# Patient Record
Sex: Female | Born: 1935
Health system: Southern US, Community
[De-identification: ages and names within clinical notes are randomized; demographics above are authoritative.]

## PROBLEM LIST (undated history)

## (undated) DIAGNOSIS — I471 Supraventricular tachycardia, unspecified: Secondary | ICD-10-CM

## (undated) DIAGNOSIS — H5440 Blindness, one eye, unspecified eye: Secondary | ICD-10-CM

## (undated) DIAGNOSIS — K5732 Diverticulitis of large intestine without perforation or abscess without bleeding: Secondary | ICD-10-CM

## (undated) DIAGNOSIS — A048 Other specified bacterial intestinal infections: Secondary | ICD-10-CM

## (undated) DIAGNOSIS — I4891 Unspecified atrial fibrillation: Secondary | ICD-10-CM

## (undated) DIAGNOSIS — T4145XA Adverse effect of unspecified anesthetic, initial encounter: Secondary | ICD-10-CM

## (undated) DIAGNOSIS — J301 Allergic rhinitis due to pollen: Secondary | ICD-10-CM

## (undated) DIAGNOSIS — I639 Cerebral infarction, unspecified: Secondary | ICD-10-CM

## (undated) DIAGNOSIS — I2510 Atherosclerotic heart disease of native coronary artery without angina pectoris: Secondary | ICD-10-CM

## (undated) DIAGNOSIS — I8390 Asymptomatic varicose veins of unspecified lower extremity: Secondary | ICD-10-CM

## (undated) DIAGNOSIS — K5790 Diverticulosis of intestine, part unspecified, without perforation or abscess without bleeding: Secondary | ICD-10-CM

## (undated) DIAGNOSIS — I10 Essential (primary) hypertension: Secondary | ICD-10-CM

## (undated) DIAGNOSIS — I219 Acute myocardial infarction, unspecified: Secondary | ICD-10-CM

## (undated) DIAGNOSIS — C4491 Basal cell carcinoma of skin, unspecified: Secondary | ICD-10-CM

## (undated) DIAGNOSIS — IMO0002 Reserved for concepts with insufficient information to code with codable children: Secondary | ICD-10-CM

## (undated) DIAGNOSIS — T8859XA Other complications of anesthesia, initial encounter: Secondary | ICD-10-CM

## (undated) DIAGNOSIS — K219 Gastro-esophageal reflux disease without esophagitis: Secondary | ICD-10-CM

## (undated) DIAGNOSIS — Z8489 Family history of other specified conditions: Secondary | ICD-10-CM

## (undated) DIAGNOSIS — K2970 Gastritis, unspecified, without bleeding: Secondary | ICD-10-CM

## (undated) DIAGNOSIS — M1990 Unspecified osteoarthritis, unspecified site: Secondary | ICD-10-CM

## (undated) HISTORY — DX: Supraventricular tachycardia: I47.1

## (undated) HISTORY — PX: CLOSED REDUCTION SHOULDER DISLOCATION: SUR242

## (undated) HISTORY — PX: CORNEAL TRANSPLANT: SHX108

## (undated) HISTORY — PX: TONSILLECTOMY: SUR1361

## (undated) HISTORY — PX: EYE SURGERY: SHX253

## (undated) HISTORY — DX: Diverticulitis of large intestine without perforation or abscess without bleeding: K57.32

## (undated) HISTORY — PX: DILATION AND CURETTAGE OF UTERUS: SHX78

## (undated) HISTORY — DX: Diverticulosis of intestine, part unspecified, without perforation or abscess without bleeding: K57.90

## (undated) HISTORY — PX: JOINT REPLACEMENT: SHX530

## (undated) HISTORY — DX: Supraventricular tachycardia, unspecified: I47.10

## (undated) HISTORY — PX: BASAL CELL CARCINOMA EXCISION: SHX1214

## (undated) HISTORY — PX: SQUAMOUS CELL CARCINOMA EXCISION: SHX2433

## (undated) HISTORY — DX: Other specified bacterial intestinal infections: A04.8

## (undated) HISTORY — DX: Gastritis, unspecified, without bleeding: K29.70

---

## 1964-03-23 HISTORY — PX: TUBAL LIGATION: SHX77

## 1983-11-22 HISTORY — PX: TOTAL ABDOMINAL HYSTERECTOMY: SHX209

## 1991-03-24 HISTORY — PX: LAPAROSCOPIC CHOLECYSTECTOMY: SUR755

## 1998-11-04 ENCOUNTER — Other Ambulatory Visit: Admission: RE | Admit: 1998-11-04 | Discharge: 1998-11-04 | Payer: Self-pay | Admitting: Family Medicine

## 1999-08-11 ENCOUNTER — Emergency Department (HOSPITAL_COMMUNITY): Admission: EM | Admit: 1999-08-11 | Discharge: 1999-08-11 | Payer: Self-pay | Admitting: Emergency Medicine

## 2002-05-22 DIAGNOSIS — A048 Other specified bacterial intestinal infections: Secondary | ICD-10-CM

## 2002-05-22 HISTORY — DX: Other specified bacterial intestinal infections: A04.8

## 2002-09-16 ENCOUNTER — Encounter: Payer: Self-pay | Admitting: Emergency Medicine

## 2002-09-16 ENCOUNTER — Emergency Department (HOSPITAL_COMMUNITY): Admission: EM | Admit: 2002-09-16 | Discharge: 2002-09-16 | Payer: Self-pay | Admitting: Emergency Medicine

## 2003-03-24 HISTORY — PX: CATARACT EXTRACTION W/ INTRAOCULAR LENS  IMPLANT, BILATERAL: SHX1307

## 2003-07-04 ENCOUNTER — Encounter: Admission: RE | Admit: 2003-07-04 | Discharge: 2003-07-04 | Payer: Self-pay | Admitting: Orthopedic Surgery

## 2003-10-11 ENCOUNTER — Ambulatory Visit (HOSPITAL_COMMUNITY): Admission: RE | Admit: 2003-10-11 | Discharge: 2003-10-11 | Payer: Self-pay | Admitting: Family Medicine

## 2004-06-10 ENCOUNTER — Ambulatory Visit: Payer: Self-pay | Admitting: Cardiology

## 2004-06-17 ENCOUNTER — Ambulatory Visit: Payer: Self-pay

## 2004-08-05 ENCOUNTER — Ambulatory Visit: Payer: Self-pay | Admitting: Cardiology

## 2004-12-20 ENCOUNTER — Emergency Department (HOSPITAL_COMMUNITY): Admission: EM | Admit: 2004-12-20 | Discharge: 2004-12-21 | Payer: Self-pay | Admitting: Emergency Medicine

## 2005-01-09 ENCOUNTER — Ambulatory Visit: Payer: Self-pay | Admitting: Cardiology

## 2005-08-27 ENCOUNTER — Encounter: Admission: RE | Admit: 2005-08-27 | Discharge: 2005-08-27 | Payer: Self-pay | Admitting: Orthopedic Surgery

## 2006-03-17 ENCOUNTER — Encounter: Admission: RE | Admit: 2006-03-17 | Discharge: 2006-03-17 | Payer: Self-pay | Admitting: Family Medicine

## 2007-10-12 ENCOUNTER — Encounter: Admission: RE | Admit: 2007-10-12 | Discharge: 2007-10-12 | Payer: Self-pay | Admitting: Family Medicine

## 2009-10-04 ENCOUNTER — Encounter: Admission: RE | Admit: 2009-10-04 | Discharge: 2009-10-04 | Payer: Self-pay | Admitting: Family Medicine

## 2009-10-30 ENCOUNTER — Encounter: Admission: RE | Admit: 2009-10-30 | Discharge: 2009-10-30 | Payer: Self-pay | Admitting: Family Medicine

## 2010-02-17 ENCOUNTER — Emergency Department (HOSPITAL_BASED_OUTPATIENT_CLINIC_OR_DEPARTMENT_OTHER): Admission: EM | Admit: 2010-02-17 | Discharge: 2010-02-17 | Payer: Self-pay | Admitting: Emergency Medicine

## 2010-02-17 ENCOUNTER — Ambulatory Visit: Payer: Self-pay | Admitting: Diagnostic Radiology

## 2010-06-04 LAB — DIFFERENTIAL
Basophils Absolute: 0.1 10*3/uL (ref 0.0–0.1)
Basophils Relative: 1 % (ref 0–1)
Eosinophils Relative: 2 % (ref 0–5)
Monocytes Absolute: 0.5 10*3/uL (ref 0.1–1.0)

## 2010-06-04 LAB — CBC
HCT: 40.1 % (ref 36.0–46.0)
MCHC: 35.2 g/dL (ref 30.0–36.0)
MCV: 90.9 fL (ref 78.0–100.0)
RDW: 13.2 % (ref 11.5–15.5)

## 2011-01-30 ENCOUNTER — Other Ambulatory Visit: Payer: Self-pay

## 2011-01-30 ENCOUNTER — Emergency Department (INDEPENDENT_AMBULATORY_CARE_PROVIDER_SITE_OTHER): Payer: Medicare Other

## 2011-01-30 ENCOUNTER — Encounter: Payer: Self-pay | Admitting: Emergency Medicine

## 2011-01-30 ENCOUNTER — Emergency Department (HOSPITAL_BASED_OUTPATIENT_CLINIC_OR_DEPARTMENT_OTHER)
Admission: EM | Admit: 2011-01-30 | Discharge: 2011-01-30 | Disposition: A | Discharge status: Home / Self Care | Payer: Medicare Other | Attending: Emergency Medicine | Admitting: Emergency Medicine

## 2011-01-30 DIAGNOSIS — R079 Chest pain, unspecified: Secondary | ICD-10-CM | POA: Insufficient documentation

## 2011-01-30 DIAGNOSIS — R000 Tachycardia, unspecified: Secondary | ICD-10-CM | POA: Insufficient documentation

## 2011-01-30 DIAGNOSIS — M549 Dorsalgia, unspecified: Secondary | ICD-10-CM | POA: Insufficient documentation

## 2011-01-30 DIAGNOSIS — R350 Frequency of micturition: Secondary | ICD-10-CM | POA: Insufficient documentation

## 2011-01-30 DIAGNOSIS — R209 Unspecified disturbances of skin sensation: Secondary | ICD-10-CM | POA: Insufficient documentation

## 2011-01-30 DIAGNOSIS — I700 Atherosclerosis of aorta: Secondary | ICD-10-CM

## 2011-01-30 DIAGNOSIS — H02409 Unspecified ptosis of unspecified eyelid: Secondary | ICD-10-CM | POA: Insufficient documentation

## 2011-01-30 DIAGNOSIS — I77810 Thoracic aortic ectasia: Secondary | ICD-10-CM | POA: Insufficient documentation

## 2011-01-30 LAB — BASIC METABOLIC PANEL
Chloride: 101 mEq/L (ref 96–112)
Creatinine, Ser: 0.7 mg/dL (ref 0.50–1.10)
GFR calc Af Amer: >90 mL/min (ref >90)
GFR calc non Af Amer: 78 mL/min — ABNORMAL LOW (ref >90)
Potassium: 3.7 mEq/L (ref 3.5–5.1)

## 2011-01-30 LAB — URINE MICROSCOPIC-ADD ON

## 2011-01-30 LAB — DIFFERENTIAL
Basophils Absolute: 0 10*3/uL (ref 0.0–0.1)
Basophils Relative: 0 % (ref 0–1)
Monocytes Absolute: 0.6 10*3/uL (ref 0.1–1.0)
Neutro Abs: 2.9 10*3/uL (ref 1.7–7.7)
Neutrophils Relative %: 48 % (ref 43–77)

## 2011-01-30 LAB — TROPONIN I
Troponin I: <0.3 ng/mL (ref <0.30)
Troponin I: <0.3 ng/mL (ref <0.30)

## 2011-01-30 LAB — URINALYSIS, ROUTINE W REFLEX MICROSCOPIC
Nitrite: NEGATIVE
Specific Gravity, Urine: 1.026 (ref 1.005–1.030)
Urobilinogen, UA: 0.2 mg/dL (ref 0.0–1.0)
pH: 7.5 (ref 5.0–8.0)

## 2011-01-30 LAB — PRO B NATRIURETIC PEPTIDE: Pro B Natriuretic peptide (BNP): 157.6 pg/mL (ref 0–450)

## 2011-01-30 LAB — CBC
MCHC: 33 g/dL (ref 30.0–36.0)
RDW: 12.6 % (ref 11.5–15.5)

## 2011-01-30 MED ORDER — IOHEXOL 350 MG/ML SOLN
100.0000 mL | Freq: Once | INTRAVENOUS | Status: AC | PRN
  Administered 2011-01-30: 100 mL via INTRAVENOUS

## 2011-01-30 MED ORDER — NITROGLYCERIN 0.4 MG SL SUBL
0.4000 mg | SUBLINGUAL_TABLET | SUBLINGUAL | Status: DC | PRN
  Filled 2011-01-30: qty 25

## 2011-01-30 MED ORDER — ASPIRIN 81 MG PO CHEW
81.0000 mg | CHEWABLE_TABLET | Freq: Once | ORAL | Status: AC
  Administered 2011-01-30: 81 mg via ORAL
  Filled 2011-01-30: qty 1

## 2011-01-30 NOTE — ED Notes (Signed)
Patient is resting comfortably. 

## 2011-01-30 NOTE — ED Notes (Signed)
Pt reports episode of chest pain radiating to Neck with rapid heart rate at 2330 arrived in no distress pain resolved

## 2011-01-30 NOTE — ED Provider Notes (Signed)
History     CSN: [id number] Arrival date & time: 01/30/2011  1:48 AM   First MD Initiated Contact with Patient 01/30/11 0150      Chief Complaint  Patient presents with  . Chest Pain    Pt reports single episode    (Consider location/radiation/quality/duration/timing/severity/associated sxs/prior treatment) Patient is a 77 y.o. female presenting with chest pain.  Chest Pain  Associated symptoms include numbness.    The patient is an 77 year old female who presents today with her daughter after having chest pain. Patient noted this at around 11 PM. She was sitting at home watching the news. Patient noted that she had left-sided chest pain that radiated down her left arm with some radiation to her right arm. She also described radiation through to her back. She describes some tingling sensations in her fingers. She said this pain was at least an 8/10. Patient denied any shortness of breath, diaphoresis, nausea, or vomiting with this. She reported that she did not "feel right". Patient went downstairs and took 381 mg aspirins. When her symptoms did not resolve after 45 minutes she called her daughter. Patient does not have any history of coronary artery disease. She has had a stress test performed by each we'll cardiology in the past that was negative. She has not had any recent cough or fever. She has urinated more than usual this evening but denies any dysuria or hematuria. Patient has virtually no medical problems. She is normally seen by Dr. Kevan Ny [facility]. The patient currently complains of some mild chest discomfort. History reviewed. No pertinent past medical history.  Past Surgical History  Procedure Date  . Cholecystectomy   . Tonsillectomy   . Abdominal hysterectomy     History reviewed. No pertinent family history.  History  Substance Use Topics  . Smoking status: Never Smoker   . Smokeless tobacco: Not on file  . Alcohol Use: No    OB History    Grav Para Term Preterm Abortions TAB SAB Ect Mult Living                  Review of Systems  Constitutional: Negative.   HENT: Negative.   Eyes: Negative.   Respiratory: Negative.   Cardiovascular: Positive for chest pain.  Gastrointestinal: Negative.   Genitourinary: Positive for frequency.  Musculoskeletal: Negative.   Skin: Negative.   Neurological: Positive for numbness.       In fingertips of both hands as noted previously in history of present illness  Hematological: Negative.   Psychiatric/Behavioral: Negative.   All other systems reviewed and are negative.    Allergies  Buprenex; Levaquin; Sulfa antibiotics; and Zetia  Home Medications   Current Outpatient Rx  Name Route Sig Dispense Refill  . ASPIRIN 81 MG PO TABS Oral Take 81 mg by mouth daily.        BP 162/100  Pulse 110  Temp 98 F (36.7 C)  Resp 22  SpO2 98%  Physical Exam  Nursing note and vitals reviewed. Constitutional: She is oriented to person, place, and time. She appears well-developed and well-nourished. No distress.  HENT:  Head: Normocephalic and atraumatic.  Eyes: Conjunctivae and EOM are normal. Pupils are equal, round, and reactive to light.  Neck: Normal range of motion.  Cardiovascular: Regular rhythm and normal heart sounds.  Tachycardia present.  Exam reveals no gallop and no friction rub.   No murmur heard. Pulmonary/Chest: Effort normal and breath sounds normal. No respiratory distress. She has  no wheezes. She has no rales.  Abdominal: Soft. Bowel sounds are normal. She exhibits no distension. There is no tenderness. There is no rebound and no guarding.  Musculoskeletal: Normal range of motion. She exhibits no edema and no tenderness.  Neurological: She is alert and oriented to person, place, and time. A cranial nerve deficit is present. She exhibits normal muscle tone. Coordination normal.       Patient has no new cranial nerve deficits but normally has left eye ptosis and no  vision out of this eye at baseline per her daughter who is at bedside  Skin: Skin is warm and dry. No rash noted.  Psychiatric: She has a normal mood and affect.    ED Course  Procedures (including critical care time)   Labs Reviewed  CBC  DIFFERENTIAL  PRO B NATRIURETIC PEPTIDE  TROPONIN I  BASIC METABOLIC PANEL  URINALYSIS, ROUTINE W REFLEX MICROSCOPIC   Dg Chest 2 View  01/30/2011  *RADIOLOGY REPORT*  Clinical Data: Chest pain  CHEST - 2 VIEW  Comparison: 10/04/2009 spine, 12/20/2004 chest radiograph  Findings: Mild tortuosity to the thoracic aorta.  Heart size within upper normal limits.  Mild right hemidiaphragm elevation. No focal areas consolidation.  No pleural effusion or pneumothorax. Multilevel degenerative changes of the thoracic spine.  Surgical clips right upper quadrant.  IMPRESSION: No acute cardiopulmonary process identified.  Original Report Authenticated By: Waneta Martins, M.D.    Date: 01/30/2011  Rate: 105  Rhythm: sinus tachycardia  QRS Axis: normal  Intervals: normal  ST/T Wave abnormalities: normal  Conduction Disutrbances:none  Narrative Interpretation: Patient borderline tachycardic to 99 on last previous EKG  Old EKG Reviewed: unchanged    No diagnosis found.    MDM  The patient was evaluated by myself. Patient had left-sided chest pain concerning for possible ACS. She was given an additional 81 mg tab of aspirin if she taken only 3 of these at home. Patient was ordered for nitroglycerin but stated that her chest discomfort had resolved prior to receiving this. Patient did have tachycardia with no other significant EKG changes. Her exam was unremarkable aside from this. Patient had a negative CBC, troponin, and chest x-ray. Her basic metabolic panel was also within normal limits. Given patient's report as well as her pain radiating to her back as well as the finding of sinus tachycardia CT of the chest was ordered. Given patient's pain in her chest  radiating to her back as well a CT abdomen and pelvis was added as well to image the entire aorta barring any abnormalities on chest. Patient remained hemodynamically stable.  5:16 AM Patient's tachycardia continued to improve. Her CAT scan this returned with no evidence of dissection or PE. Patient remained pain free. A repeat troponin for 3 hours is pending at this time. Patient also had a urinalysis which was unremarkable. As patient has been chest pain free throughout her time here and her vital signs have continued to normalize followup with her outpatient physician is reasonable. Patient also prefers this option rather than an admission for observation.   5:28 AM Final troponin returned negative. Patient will have a final set of vitals will be discharged home in good condition. She can followup with her primary care physician.  Cyndra Numbers, MD 01/30/11 [phone]

## 2011-01-30 NOTE — ED Notes (Signed)
MD at bedside. 

## 2011-08-04 ENCOUNTER — Other Ambulatory Visit: Payer: Self-pay | Admitting: Family Medicine

## 2011-08-04 ENCOUNTER — Ambulatory Visit
Admission: RE | Admit: 2011-08-04 | Discharge: 2011-08-04 | Disposition: A | Discharge status: Home / Self Care | Payer: BLUE CROSS/BLUE SHIELD | Source: Ambulatory Visit | Attending: Family Medicine | Admitting: Family Medicine

## 2011-08-04 DIAGNOSIS — R5383 Other fatigue: Secondary | ICD-10-CM

## 2011-08-04 DIAGNOSIS — R0602 Shortness of breath: Secondary | ICD-10-CM

## 2011-12-14 ENCOUNTER — Encounter (HOSPITAL_COMMUNITY): Payer: Self-pay | Admitting: Emergency Medicine

## 2011-12-14 ENCOUNTER — Emergency Department (HOSPITAL_COMMUNITY): Payer: Medicare Other

## 2011-12-14 ENCOUNTER — Emergency Department (HOSPITAL_COMMUNITY)
Admission: EM | Admit: 2011-12-14 | Discharge: 2011-12-14 | Disposition: A | Discharge status: Home / Self Care | Payer: Medicare Other | Attending: Emergency Medicine | Admitting: Emergency Medicine

## 2011-12-14 DIAGNOSIS — R002 Palpitations: Secondary | ICD-10-CM | POA: Insufficient documentation

## 2011-12-14 DIAGNOSIS — I471 Supraventricular tachycardia: Secondary | ICD-10-CM

## 2011-12-14 HISTORY — DX: Blindness, one eye, unspecified eye: H54.40

## 2011-12-14 HISTORY — DX: Unspecified osteoarthritis, unspecified site: M19.90

## 2011-12-14 LAB — COMPREHENSIVE METABOLIC PANEL
AST: 25 U/L (ref 0–37)
Albumin: 3.2 g/dL — ABNORMAL LOW (ref 3.5–5.2)
Albumin: 3.5 g/dL (ref 3.5–5.2)
Alkaline Phosphatase: 96 U/L (ref 39–117)
BUN: 18 mg/dL (ref 6–23)
BUN: 19 mg/dL (ref 6–23)
Calcium: 9.4 mg/dL (ref 8.4–10.5)
Calcium: 9.7 mg/dL (ref 8.4–10.5)
Chloride: 100 mEq/L (ref 96–112)
Creatinine, Ser: 0.57 mg/dL (ref 0.50–1.10)
GFR calc Af Amer: >90 mL/min (ref >90)
Glucose, Bld: 113 mg/dL — ABNORMAL HIGH (ref 70–99)
Potassium: 3.9 mEq/L (ref 3.5–5.1)
Sodium: 141 mEq/L (ref 135–145)
Total Bilirubin: 0.2 mg/dL — ABNORMAL LOW (ref 0.3–1.2)
Total Protein: 5.9 g/dL — ABNORMAL LOW (ref 6.0–8.3)

## 2011-12-14 LAB — CBC
HCT: 46.2 % — ABNORMAL HIGH (ref 36.0–46.0)
MCH: 31.4 pg (ref 26.0–34.0)
MCHC: 33.1 g/dL (ref 30.0–36.0)
MCV: 94.7 fL (ref 78.0–100.0)
Platelets: 225 10*3/uL (ref 150–400)
RDW: 13.3 % (ref 11.5–15.5)
WBC: 7.2 10*3/uL (ref 4.0–10.5)

## 2011-12-14 LAB — TROPONIN I: Troponin I: <0.3 ng/mL (ref <0.30)

## 2011-12-14 MED ORDER — SODIUM CHLORIDE 0.9 % IV SOLN
INTRAVENOUS | Status: DC
  Administered 2011-12-14: 02:00:00 via INTRAVENOUS

## 2011-12-14 MED ORDER — ADENOSINE 6 MG/2ML IV SOLN
INTRAVENOUS | Status: AC
  Filled 2011-12-14: qty 4

## 2011-12-14 NOTE — ED Notes (Signed)
Per EMS:  EMS dispatched to scene - pt c/o "fast heart beat"  Heart rate was in 180's.  Pt received 12 mg of adenosine which decreased heart rate to 108-110 bpm.

## 2011-12-14 NOTE — ED Provider Notes (Signed)
History     CSN: [id number]  Arrival date & time 12/14/11  0007   First MD Initiated Contact with Patient 12/14/11 0015      No chief complaint on file.   (Consider location/radiation/quality/duration/timing/severity/associated sxs/prior treatment) HPI Hx per PT and EMS- called 911 for palpitations developed at home tonight around 10pm. Felt near syncopal. Found to have HR 184 in the field and was given 12mg  Adenosine for SVT. PT converted to NSR and now feels better. PT states this happened once in the past and she "sweated it out" at home and it went away before she was evaluated.  Some chest tightness. No SOB. No F/C, no recent illness, MOD in severity. She did go go to the beach this weekend and was in the sun a lot, but denies feeling dehydrated.  Followed by [facility] Dr Kevan Ny. No new medications or recent illness. Has seen [facility] in the past for a stress test - reported as basically normal - did not have a cath.  Also wore a holter monitor. Now feels a little tired otherwise much better.   No past medical history on file.  Past Surgical History  Procedure Date  . Cholecystectomy   . Tonsillectomy   . Abdominal hysterectomy     No family history on file.  History  Substance Use Topics  . Smoking status: Never Smoker   . Smokeless tobacco: Not on file  . Alcohol Use: No    OB History    Grav Para Term Preterm Abortions TAB SAB Ect Mult Living                  Review of Systems  Constitutional: Negative for fever and chills.  HENT: Negative for neck pain and neck stiffness.   Eyes: Negative for pain.  Respiratory: Negative for cough and shortness of breath.   Cardiovascular: Positive for palpitations.  Gastrointestinal: Negative for abdominal pain.  Genitourinary: Negative for dysuria.  Musculoskeletal: Negative for back pain.  Skin: Negative for rash.  Neurological: Negative for headaches.  All other systems reviewed and are negative.    Allergies  Buprenex;  Levofloxacin; Sulfa antibiotics; and Zetia  Home Medications   Current Outpatient Rx  Name Route Sig Dispense Refill  . ASPIRIN 81 MG PO TABS Oral Take 81 mg by mouth daily.        There were no vitals taken for this visit.  Physical Exam  Constitutional: She is oriented to person, place, and time. She appears well-developed and well-nourished.  HENT:  Head: Normocephalic and atraumatic.  Eyes: Conjunctivae normal and EOM are normal. Pupils are equal, round, and reactive to light.  Neck: Trachea normal. Neck supple. No thyromegaly present.  Cardiovascular: Normal rate, regular rhythm, S1 normal, S2 normal and normal pulses.     No systolic murmur is present   No diastolic murmur is present  Pulses:      Radial pulses are 2+ on the right side, and 2+ on the left side.  Pulmonary/Chest: Effort normal and breath sounds normal. She has no wheezes. She has no rhonchi. She has no rales. She exhibits no tenderness.  Abdominal: Soft. Normal appearance and bowel sounds are normal. There is no tenderness. There is no CVA tenderness and negative Murphy's sign.  Musculoskeletal:       BLE:s Calves nontender, no cords or erythema, negative Homans sign  Neurological: She is alert and oriented to person, place, and time. She has normal strength. No cranial nerve deficit or sensory  deficit. GCS eye subscore is 4. GCS verbal subscore is 5. GCS motor subscore is 6.  Skin: Skin is warm and dry. No rash noted. She is not diaphoretic.  Psychiatric: Her speech is normal.       Cooperative and appropriate    ED Course  Procedures (including critical care time)   Date: 12/14/2011  Rate: 115  Rhythm: sinus tachycardia  QRS Axis: normal  Intervals: normal  ST/T Wave abnormalities: nonspecific ST changes  Conduction Disutrbances:none  Narrative Interpretation:   Old EKG Reviewed: unchanged  Results for orders placed during the hospital encounter of 12/14/11  CBC      Component Value Range   WBC  7.2  4.0 - 10.5 K/uL   RBC 4.88  3.87 - 5.11 MIL/uL   Hemoglobin 15.3 (*) 12.0 - 15.0 g/dL   HCT 96.0 (*) 45.4 - 09.8 %   MCV 94.7  78.0 - 100.0 fL   MCH 31.4  26.0 - 34.0 pg   MCHC 33.1  30.0 - 36.0 g/dL   RDW 11.9  14.7 - 82.9 %   Platelets 225  150 - 400 K/uL   Dg Chest Portable 1 View  12/14/2011  *RADIOLOGY REPORT*  Clinical Data: 78 year old female with chest tightness, left arm weakness, palpitations.  PORTABLE CHEST - 1 VIEW  Comparison: 08/04/2011 and earlier.  Findings: Portable semi upright AP view at 0024 hours.  Stable lung volumes.  Stable cardiac size and mediastinal contours.  No pneumothorax, pulmonary edema, pleural effusion or acute pulmonary opacity.  IMPRESSION: No acute cardiopulmonary abnormality.   Original Report Authenticated By: Harley Hallmark, M.D.    i-stat troponin and chem 8 reviewed WNL.   Serial evaluations remains sinus and asymptomatic.   [facility] consult  1:49 AM d/u Dr Maryelizabeth Kaufmann who evaluated PT bedside.   PT wants to go home and CAR feels she is stable for discharge. Plan is to f/u this week in clinic with DR Excell Seltzer to consider medication options, at this time PT prefers not to start any medications.  MDM   VS and nursing notes reviewed.   ECG, CXR and labs.   IVFs for possible dehydration by history.   [facility] consult.         Sunnie Nielsen, MD 12/14/11 [phone]

## 2011-12-14 NOTE — ED Notes (Signed)
Pt reports taking 324 mg of asa - per advice of 911 dispatcher around 2300

## 2011-12-15 ENCOUNTER — Encounter: Payer: Self-pay | Admitting: *Deleted

## 2011-12-15 ENCOUNTER — Encounter: Payer: Self-pay | Admitting: Cardiovascular Disease

## 2011-12-15 ENCOUNTER — Ambulatory Visit (INDEPENDENT_AMBULATORY_CARE_PROVIDER_SITE_OTHER): Payer: Medicare Other | Admitting: Cardiovascular Disease

## 2011-12-15 VITALS — BP 124/84 | HR 79 | Ht 62.0 in | Wt 171.8 lb

## 2011-12-15 DIAGNOSIS — I471 Supraventricular tachycardia: Secondary | ICD-10-CM

## 2011-12-15 DIAGNOSIS — Z8679 Personal history of other diseases of the circulatory system: Secondary | ICD-10-CM | POA: Insufficient documentation

## 2011-12-15 DIAGNOSIS — I498 Other specified cardiac arrhythmias: Secondary | ICD-10-CM

## 2011-12-15 MED ORDER — METOPROLOL TARTRATE 25 MG PO TABS: 25.0000 mg | ORAL_TABLET | Freq: Two times a day (BID) | ORAL | Status: DC | PRN

## 2011-12-15 NOTE — Progress Notes (Signed)
HPI:  78 year old woman presenting for initial evaluation of tachycardia. She was in her normal state of health 2 days ago when she developed sudden discomfort in her chest associated with heart racing, weakness, and lightheadedness. She felt as if her "heart was pounding." She tried to rest for about an hour but the symptoms never went away. She called EMS upon their arrival she was noted to have heart rate of 180 beats per minute. By her description, she was treated with intravenous adenosine and she subsequently converted back to sinus rhythm. I have reviewed all the records from her emergency room visit, as well as available EKGs. The EKG from the field is not available for review.  The patient has a history of tachypalpitations over the past several years. She has rare episodes less than one every 6 months. She's had one other emergency room evaluation in 2012 for the same problem. She otherwise denies exertional chest pain or pressure, dyspnea, edema, orthopnea, or PND.  The patient lives alone. She is widowed. She maintains an active lifestyle. She no longer drives because of vision loss, but otherwise is pretty active. She has no symptoms with exertion.  Outpatient Encounter Prescriptions as of 12/15/2011  Medication Sig Dispense Refill  . aspirin 81 MG tablet Take 81 mg by mouth daily.        . fish oil-omega-3 fatty acids 1000 MG capsule Take 1 g by mouth daily.      Marland Kitchen GNP GARLIC EXTRACT PO Take by mouth daily.      . Magnesium 250 MG TABS Take by mouth daily.      . Misc Natural Products (OSTEO BI-FLEX JOINT SHIELD PO) Take by mouth daily.      . Multiple Vitamin (MULTIVITAMIN WITH MINERALS) TABS Take 1 tablet by mouth daily.      Marland Kitchen PARoxetine (PAXIL) 10 MG tablet Take 10 mg by mouth every morning.      . polyvinyl alcohol (LIQUIFILM TEARS) 1.4 % ophthalmic solution Place 1 drop into both eyes 4 (four) times daily as needed. For dry eyes      . prednisoLONE acetate (PRED FORTE) 1 %  ophthalmic suspension Place 1 drop into both eyes 4 (four) times daily.      . Selenium 200 MCG CAPS Take by mouth.      . metoprolol tartrate (LOPRESSOR) 25 MG tablet Take 1 tablet (25 mg total) by mouth 2 (two) times daily as needed.  60 tablet  11  . DISCONTD: fluticasone (CUTIVATE) 0.005 % ointment         Buprenex; Levofloxacin; Sulfa antibiotics; and Zetia  Past Medical History  Diagnosis Date  . Arthritis   . Blind left eye     Past Surgical History  Procedure Date  . Cholecystectomy   . Tonsillectomy   . Abdominal hysterectomy   . Tonsillectomy   . Corneal transplant     History   Social History  . Marital Status: Married    Spouse Name: N/A    Number of Children: N/A  . Years of Education: N/A   Occupational History  . Not on file.   Social History Main Topics  . Smoking status: Never Smoker   . Smokeless tobacco: Not on file  . Alcohol Use: No  . Drug Use: No  . Sexually Active: No   Other Topics Concern  . Not on file   Social History Narrative  . No narrative on file   Family history: Her father had  a myocardial infarction in his 77s, mother died of a stroke at age 32  ROS:  General: no fevers/chills/night sweats Eyes: Positive for vision loss, blind in left eye. ENT: no sore throat or hearing loss Resp: no cough, wheezing, or hemoptysis CV: See history of present illness GI: no abdominal pain, nausea, vomiting, diarrhea, or constipation GU: no dysuria, frequency, or hematuria Skin: no rash Neuro: no headache, numbness, tingling, or weakness of extremities Musculoskeletal: Bilateral knee pain and swelling Heme: no bleeding, DVT, or easy bruising Endo: no polydipsia or polyuria  BP 124/84  Pulse 79  Ht 5\' 2"  (1.575 m)  Wt 77.928 kg (171 lb 12.8 oz)  BMI 31.42 kg/m2  PHYSICAL EXAM: Pt is alert and oriented, WD, WN, very nice elderly woman in no distress. HEENT: normal Neck: JVP normal. Carotid upstrokes normal without bruits. No  thyromegaly. Lungs: equal expansion, clear bilaterally CV: Apex is discrete and nondisplaced, RRR without murmur or gallop Abd: soft, NT, +BS, no bruit, no hepatosplenomegaly Back: no CVA tenderness Ext: no C/C/E        Femoral pulses 2+= without bruits        DP/PT pulses intact and = Skin: warm and dry without rash Neuro: CNII-XII intact             Strength intact = bilaterally  EKG:  Normal sinus rhythm 79 beats per minute, within normal limits.  ASSESSMENT AND PLAN: Tachypalpitations suspect SVT. Based on the patient's age and episodes with sudden onset and successful treatment with adenosine, this sounds consistent with supraventricular tachycardia. Multiple possibilities exist including AVNRT, AVRT, or atrial tachycardia. We discussed potential treatment options including daily medication, when necessary medication, or consideration of electrophysiology referral for EP study and potential radiofrequency ablation. Based on the fact that her symptoms occur on rare occasion, we will start with as needed metoprolol. She can take 25 mg if symptoms occur. We discussed Valsalva maneuvers as well. I would like to check an echocardiogram to rule out structural heart disease, but I do not suspect this based on her exam. She will followup in 6 months.  Preoperative cardiac evaluation. The patient has a normal 12-lead EKG. She has no anginal symptoms with exertion. She was just evaluated in the emergency room and with a heart rate of 180 beats per minute she had no elevation of her troponin level. I think she can proceed with knee replacement at low risk of cardiac morbidity or mortality. She does not require preoperative stress testing based on current ACC/AHA guidelines.  Tonny Bollman 12/15/2011 3:16 PM

## 2011-12-15 NOTE — Patient Instructions (Addendum)
Your physician has requested that you have an echocardiogram. Echocardiography is a painless test that uses sound waves to create images of your heart. It provides your doctor with information about the size and shape of your heart and how well your heart's chambers and valves are working. This procedure takes approximately one hour. There are no restrictions for this procedure.  Your physician wants you to follow-up in: 6 MONTHS.  You will receive a reminder letter in the mail two months in advance. If you don't receive a letter, please call our office to schedule the follow-up appointment.  Your physician has recommended you make the following change in your medication: START Metoprolol Tartrate 25mg  take one by mouth two times a day as needed for palpitations  Supraventricular Tachycardia Supraventricular tachycardia (SVT) is an abnormal heart rhythm (arrhythmia) that causes the heart to beat very fast (tachycardia). This kind of fast heartbeat originates in the upper chambers of the heart (atria). SVT can cause the heart to beat greater than 100 beats per minute. SVT can have a rapid burst of heartbeats. This can start and stop suddenly without warning and is called nonsustained. SVT can also be sustained, in which the heart beats at a continuous fast rate.  CAUSES  There can be different causes of SVT. Some of these include:  Heart valve problems such as mitral valve prolapse.   An enlarged heart (hypertrophic cardiomyopathy).   Congenital heart problems.   Heart inflammation (pericarditis).   Hyperthyroidism.   Low potassium or magnesium levels.   Caffeine.   Drug use such as cocaine, methamphetamines, or stimulants.   Some over-the-counter medicines such as:   Decongestants.   Diet medicines.   Herbal medicines.  SYMPTOMS  Symptoms of SVT can vary. Symptoms depend on whether the SVT is sustained or nonsustained. You may experience:  No symptoms (asymptomatic).   An  awareness of your heart beating rapidly (palpitations).   Shortness of breath.   Chest pain or pressure.  If your blood pressure drops because of the SVT, you may experience:  Fainting or near fainting.   Weakness.   Dizziness.  DIAGNOSIS  Different tests can be performed to diagnose SVT, such as:  An electrocardiogram (EKG). This is a painless test that records the electrical activity of your heart.   Holter monitor. This is a 24 hour recording of your heart rhythm. You will be given a diary. Write down all symptoms that you have and what you were doing at the time you experienced symptoms.   Arrhythmia monitor. This is a small device that your wear for several weeks. It records the heart rhythm when you have symptoms.   Echocardiogram. This is an imaging test to help detect abnormal heart structure such as congenital abnormalities, heart valve problems, or heart enlargement.   Stress test. This test can help determine if the SVT is related to exercise.   Electrophysiology study (EPS). This is a procedure that evaluates your heart's electrical system and can help your caregiver find the cause of your SVT.  TREATMENT  Treatment of SVT depends on the symptoms, how often it recurs, and whether there are any underlying heart problems.   If symptoms are rare and no other cardiac disease is present, no treatment may be needed.   Blood work may be done to check potassium, magnesium, and thyroid hormone levels to see if they are abnormal. If these levels are abnormal, treatment to correct the problems will occur.  Medicines Your caregiver may  use oral medicines to treat SVT. These medicines are given for long-term control of SVT. Medicines may be used alone or in combination with other treatments. These medicines work to slow nerve impulses in the heart muscle. These medicines can also be used to treat high blood pressure. Some of these medicines may include:  Calcium channel blockers.     Beta blockers.   Digoxin.  Nonsurgical procedures Nonsurgical techniques may be used if oral medicines do not work. Some examples include:  Cardioversion. This technique uses either drugs or an electrical shock to restore a normal heart rhythm.   Cardioversion drugs may be given through an intravenous (IV) line to help "reset" the heart rhythm.   In electrical cardioversion, the caregiver shocks your heart to stop its beat for a split second. This helps to reset the heart to a normal rhythm.   Ablation. This procedure is done under mild sedation. High frequency radio wave energy is used to destroy the area of heart tissue responsible for the SVT.  HOME CARE INSTRUCTIONS   Do not smoke.   Only take medicines prescribed by your caregiver. Check with your caregiver before using over-the-counter medicines.   Check with your caregiver about how much alcohol and caffeine (coffee, tea, colas, or chocolate) you may have.   It is very important to keep all follow-up referrals and appointments in order to properly manage this problem.  SEEK IMMEDIATE MEDICAL CARE IF:  You have dizziness.   You faint or nearly faint.   You have shortness of breath.   You have chest pain or pressure.   You have sudden nausea or vomiting.   You have profuse sweating.   You are concerned about how long your symptoms last.   You are concerned about the frequency of your SVT episodes.  If you have the above symptoms, call your local emergency services (911 in [place] immediately. Do not drive yourself to the hospital. MAKE SURE YOU:   Understand these instructions.   Will watch your condition.   Will get help right away if you are not doing well or get worse.  Document Released: 03/09/2005 Document Revised: 02/26/2011 Document Reviewed: 06/21/2008 [facility] Patient Information 2012 [place], [place].

## 2011-12-16 LAB — POCT I-STAT, CHEM 8
Creatinine, Ser: 0.7 mg/dL (ref 0.50–1.10)
HCT: 46 % (ref 36.0–46.0)
Hemoglobin: 15.6 g/dL — ABNORMAL HIGH (ref 12.0–15.0)
Potassium: 3.6 mEq/L (ref 3.5–5.1)
Sodium: 142 mEq/L (ref 135–145)
TCO2: 26 mmol/L (ref 0–100)

## 2011-12-16 LAB — POCT I-STAT TROPONIN I

## 2011-12-22 ENCOUNTER — Ambulatory Visit (HOSPITAL_COMMUNITY): Payer: Medicare Other | Attending: Cardiovascular Disease | Admitting: Radiology

## 2011-12-22 DIAGNOSIS — I471 Supraventricular tachycardia, unspecified: Secondary | ICD-10-CM | POA: Insufficient documentation

## 2011-12-22 DIAGNOSIS — I059 Rheumatic mitral valve disease, unspecified: Secondary | ICD-10-CM | POA: Insufficient documentation

## 2011-12-22 DIAGNOSIS — I369 Nonrheumatic tricuspid valve disorder, unspecified: Secondary | ICD-10-CM | POA: Insufficient documentation

## 2011-12-22 NOTE — Progress Notes (Signed)
Echocardiogram performed.  

## 2012-03-30 ENCOUNTER — Inpatient Hospital Stay (HOSPITAL_COMMUNITY): Admission: RE | Admit: 2012-03-30 | Payer: Medicare Other | Source: Ambulatory Visit | Admitting: Orthopedic Surgery

## 2012-03-30 ENCOUNTER — Encounter (HOSPITAL_COMMUNITY): Admission: RE | Payer: Self-pay | Source: Ambulatory Visit

## 2012-03-30 SURGERY — ARTHROPLASTY, KNEE, TOTAL
Anesthesia: General | Site: Knee | Laterality: Right

## 2012-05-18 ENCOUNTER — Other Ambulatory Visit: Payer: Self-pay | Admitting: Dermatology

## 2012-06-10 ENCOUNTER — Ambulatory Visit (INDEPENDENT_AMBULATORY_CARE_PROVIDER_SITE_OTHER): Payer: Medicare Other | Admitting: Cardiovascular Disease

## 2012-06-10 ENCOUNTER — Encounter: Payer: Self-pay | Admitting: Cardiovascular Disease

## 2012-06-10 VITALS — BP 154/88 | HR 94 | Ht 62.0 in | Wt 174.4 lb

## 2012-06-10 DIAGNOSIS — I471 Supraventricular tachycardia: Secondary | ICD-10-CM

## 2012-06-10 DIAGNOSIS — I498 Other specified cardiac arrhythmias: Secondary | ICD-10-CM

## 2012-06-10 NOTE — Progress Notes (Signed)
HPI:  79 year old woman presenting for followup evaluation. She was seen in September 2013 for evaluation of tachycardia. She was noted to have a heart rate of 180 beats per minute after she called EMS when she experienced rapid heartbeats. She converted back to sinus rhythm after being treated with intravenous adenosine.  She said 2 further episodes since I last saw her. She tried Valsalva maneuvers but this did not work. After taking metoprolol her symptoms resolved spontaneously. She otherwise feels well. She has upcoming knee replacement surgery. She denies chest pain or dyspnea. She has had some episodes of weakness after exercise and has attributed this to low blood sugar. She has not had exertional symptoms.  Outpatient Encounter Prescriptions as of 06/10/2012  Medication Sig Dispense Refill  . aspirin 81 MG tablet Take 81 mg by mouth daily.        . fish oil-omega-3 fatty acids 1000 MG capsule Take 1 g by mouth daily.      Marland Kitchen GNP GARLIC EXTRACT PO Take by mouth daily.      . Magnesium 250 MG TABS Take by mouth daily.      . metoprolol tartrate (LOPRESSOR) 25 MG tablet Take 1 tablet (25 mg total) by mouth 2 (two) times daily as needed.  60 tablet  11  . Misc Natural Products (OSTEO BI-FLEX JOINT SHIELD PO) Take by mouth daily.      . Multiple Vitamin (MULTIVITAMIN WITH MINERALS) TABS Take 1 tablet by mouth daily.      Marland Kitchen PARoxetine (PAXIL) 10 MG tablet Take 10 mg by mouth every morning.      . polyvinyl alcohol (LIQUIFILM TEARS) 1.4 % ophthalmic solution Place 1 drop into both eyes 4 (four) times daily as needed. For dry eyes      . prednisoLONE acetate (PRED FORTE) 1 % ophthalmic suspension Place 1 drop into both eyes 4 (four) times daily.      . Selenium 200 MCG CAPS Take by mouth.       No facility-administered encounter medications on file as of 06/10/2012.    Allergies  Allergen Reactions  . Buprenex (Buprenorphine Hcl) Other (See Comments)    unknown  . Levofloxacin Other (See  Comments)    unknown  . Sulfa Antibiotics Other (See Comments)    unknown  . Zetia (Ezetimibe) Other (See Comments)    unknown    Past Medical History  Diagnosis Date  . Arthritis   . Blind left eye    ROS: Negative except as per HPI  BP 154/88  Pulse 94  Ht 5\' 2"  (1.575 m)  Wt 174 lb 6.4 oz (79.107 kg)  BMI 31.89 kg/m2  SpO2 98%  PHYSICAL EXAM: Pt is alert and oriented, pleasant elderly woman in NAD HEENT: normal Neck: JVP - normal, carotids 2+= without bruits Lungs: CTA bilaterally CV: RRR without murmur or gallop Abd: soft, NT, Positive BS, no hepatomegaly Ext: no C/C/E, distal pulses intact and equal Skin: warm/dry no rash  EKG:  Normal sinus rhythm 93 beats per minute, within normal limits.  2-D echo October 2013: Study Conclusions  - Left ventricle: The cavity size was normal. Wall thickness was increased in a pattern of mild LVH. Systolic function was normal. The estimated ejection fraction was in the range of 60% to 65%. Wall motion was normal; there were no regional wall motion abnormalities. Doppler parameters are consistent with abnormal left ventricular relaxation (grade 1 diastolic dysfunction). - Aortic valve: There was no stenosis. - Mitral valve:  Trivial regurgitation. - Left atrium: The atrium was mildly dilated. - Right ventricle: The cavity size was normal. Systolic function was normal. - Tricuspid valve: Peak RV-RA gradient: 27mm Hg (S). - Pulmonary arteries: PA peak pressure: 32mm Hg (S). - Inferior vena cava: The vessel was normal in size; the respirophasic diameter changes were in the normal range (= 50%); findings are consistent with normal central venous pressure. Impressions:  - Normal LV size with mild LV hypertrophy. EF 60-65%. Normal RV size and systolic function. No significant valvular abnormalities.  ASSESSMENT AND PLAN: 1. Tachypalpitations, suspect SVT. Overall she is stable. She's had 2 episodes over the last 6 months. I  recommended consideration of daily long-acting metoprolol, but she does not want to take another medicine. She will continue with Lopressor as needed. She will contact me if symptoms worsen, otherwise I will see her back in 6 months.  2. Postexertional weakness. This is probably related to hypotension. I advised her to push fluids and snack frequently.  3. Preoperative evaluation. Discussion as per last office visit. She is stable from a cardiac perspective and can proceed with surgery without further cardiac testing.  Tonny Bollman 06/10/2012 10:38 AM

## 2012-06-10 NOTE — Patient Instructions (Addendum)
Your physician wants you to follow-up in: 6 months with Dr. Cooper.  You will receive a reminder letter in the mail two months in advance. If you don't receive a letter, please call our office to schedule the follow-up appointment.   

## 2012-06-15 ENCOUNTER — Encounter (HOSPITAL_COMMUNITY): Payer: Self-pay | Admitting: Pharmacy Technician

## 2012-06-15 ENCOUNTER — Telehealth: Payer: Self-pay | Admitting: Cardiovascular Disease

## 2012-06-15 ENCOUNTER — Other Ambulatory Visit: Payer: Self-pay | Admitting: Dermatology

## 2012-06-15 NOTE — Telephone Encounter (Signed)
Pt calling to make sure it was still okay to take metoprolol as needed for the rapid heartrate with upcoming knee surgery.  She is asymptomatic & was unsure if she needed to take metoprolol everyday or not. Reassured pt it was and if her symptoms were to worsen to call the office.    [name] RN

## 2012-06-15 NOTE — Telephone Encounter (Signed)
New problem    Pt has questions before she goes in for knee surgery

## 2012-06-16 ENCOUNTER — Other Ambulatory Visit: Payer: Self-pay | Admitting: Orthopedic Surgery

## 2012-06-16 MED ORDER — DEXAMETHASONE SODIUM PHOSPHATE 10 MG/ML IJ SOLN: 10.0000 mg | Freq: Once | INTRAMUSCULAR | Status: DC

## 2012-06-16 MED ORDER — BUPIVACAINE LIPOSOME 1.3 % IJ SUSP: 20.0000 mL | Freq: Once | INTRAMUSCULAR | Status: DC

## 2012-06-16 NOTE — Progress Notes (Signed)
Preoperative surgical orders have been place into the Epic hospital system for [facility] on 06/16/2012, 11:03 AM  by Patrica Duel for surgery on 06/27/2012.  Preop Total Knee orders including Experal, IV Tylenol, and IV Decadron as long as there are no contraindications to the above medications. Avel Peace, PA-C

## 2012-06-21 ENCOUNTER — Encounter (HOSPITAL_COMMUNITY): Payer: Self-pay

## 2012-06-21 ENCOUNTER — Other Ambulatory Visit (HOSPITAL_COMMUNITY): Payer: Self-pay | Admitting: Orthopedic Surgery

## 2012-06-21 ENCOUNTER — Encounter (HOSPITAL_COMMUNITY)
Admission: RE | Admit: 2012-06-21 | Discharge: 2012-06-21 | Disposition: A | Discharge status: Home / Self Care | Payer: Medicare Other | Source: Ambulatory Visit | Attending: Orthopedic Surgery | Admitting: Orthopedic Surgery

## 2012-06-21 HISTORY — DX: Asymptomatic varicose veins of unspecified lower extremity: I83.90

## 2012-06-21 HISTORY — DX: Other complications of anesthesia, initial encounter: T88.59XA

## 2012-06-21 HISTORY — DX: Adverse effect of unspecified anesthetic, initial encounter: T41.45XA

## 2012-06-21 LAB — CBC
HCT: 43.5 % (ref 36.0–46.0)
MCH: 30.9 pg (ref 26.0–34.0)
MCHC: 32.6 g/dL (ref 30.0–36.0)
MCV: 94.8 fL (ref 78.0–100.0)
Platelets: 227 10*3/uL (ref 150–400)
RDW: 13.4 % (ref 11.5–15.5)
WBC: 5.9 10*3/uL (ref 4.0–10.5)

## 2012-06-21 LAB — URINALYSIS, ROUTINE W REFLEX MICROSCOPIC
Glucose, UA: NEGATIVE mg/dL
Hgb urine dipstick: NEGATIVE
Ketones, ur: NEGATIVE mg/dL
Protein, ur: NEGATIVE mg/dL

## 2012-06-21 LAB — COMPREHENSIVE METABOLIC PANEL
AST: 25 U/L (ref 0–37)
Albumin: 3.4 g/dL — ABNORMAL LOW (ref 3.5–5.2)
BUN: 18 mg/dL (ref 6–23)
Calcium: 9.2 mg/dL (ref 8.4–10.5)
Chloride: 101 mEq/L (ref 96–112)
Creatinine, Ser: 0.71 mg/dL (ref 0.50–1.10)
Total Protein: 6.5 g/dL (ref 6.0–8.3)

## 2012-06-21 LAB — URINE MICROSCOPIC-ADD ON

## 2012-06-21 LAB — SURGICAL PCR SCREEN: MRSA, PCR: NEGATIVE

## 2012-06-21 NOTE — Patient Instructions (Addendum)
20 Iver Fehrenbach  06/21/2012   Your procedure is scheduled on: 06-27-2012  Report to [facility] at [phone] AM.  Call this number if you have problems the morning of surgery [phone]   Remember:   Do not eat food or drink liquids :After Midnight.     Take these medicines the morning of surgery with A SIP OF WATER: pred forte eye drop, paroxetine, muro eye drop                                SEE [facility] PREPARING FOR SURGERY SHEET   Do not wear jewelry, make-up or nail polish.  Do not wear lotions, powders, or perfumes. You may wear deodorant.   Men may shave face and neck.  Do not bring valuables to the hospital.  Contacts, dentures or bridgework may not be worn into surgery.  Leave suitcase in the car. After surgery it may be brought to your room.  For patients admitted to the hospital, checkout time is 11:00 AM the day of discharge.   Patients discharged the day of surgery will not be allowed to drive home.  Name and phone number of your driver:  Special Instructions: N/A   Please read over the following fact sheets that you were given: MRSA Information., blood fact sheet, incentive spirometer fact sheet Call Cain Sieve RN pre op nurse if needed [phone]    FAILURE TO FOLLOW THESE INSTRUCTIONS MAY RESULT IN THE CANCELLATION OF YOUR SURGERY. PATIENT SIGNATURE___________________________________________

## 2012-06-22 NOTE — Progress Notes (Signed)
Micro ua results faxed to dr aluisio by epic 

## 2012-06-23 ENCOUNTER — Other Ambulatory Visit (HOSPITAL_COMMUNITY): Payer: Self-pay | Admitting: Orthopedic Surgery

## 2012-06-26 ENCOUNTER — Other Ambulatory Visit: Payer: Self-pay | Admitting: Orthopedic Surgery

## 2012-06-26 NOTE — H&P (Signed)
[name] [name]  DOB: 07/07/1924 Married / Language: English / Race: White Female  Date of Admission:  06/27/2012  Chief Complaint: Left Knee Pain  History of Present Illness The patient is a 79 year old female who comes in today for a preoperative History and Physical. The patient is scheduled for a left total knee arthroplasty to be performed by Dr. Frank V. Aluisio, MD at [facility] on 07/13/2012. The patient is a 79 year old female who presents with knee complaints. The patient is seen today changing care from Dr. Gioffre. The patient reports bilateral knee symptoms including: pain, swelling and stiffness which began year(s) ago without any known injury. The patient describes the severity of the symptoms as moderate in severity.The patient feels that the symptoms are worsening. The patient has the current diagnosis of knee osteoarthritis. Previous work-up for this problem has included knee x-rays. Past treatment for this problem has included application of ice and intra-articular injection of corticosteroids (last injection was on 01/25/12. Injections have usually helped for about a month). Current treatment includes application of heat and nonsteroidal anti-inflammatory drugs (Advil). She had previously been scheduled for a total knee arthroplasty by Dr. Gioffre but wanted to switch over to me, as I have operated on some friends. She says her knees hurt at all times. It limits what she can and cannot do. Other than the knee, she has been extremely active and likes to do a lot of gardening. The knee has prevented her from doing that. Her overall health has been excellent. They have been treated conservatively in the past for the above stated problem and despite conservative measures, they continue to have progressive pain and severe functional limitations and dysfunction. They have failed non-operative management including home exercise, medications, and injections. It is  felt that they would benefit from undergoing total joint replacement. Risks and benefits of the procedure have been discussed with the patient and they elect to proceed with surgery. There are no active contraindications to surgery such as ongoing infection or rapidly progressive neurological disease.   Problem List Primary osteoarthritis of both knees (715.16)   Allergies Levaquin *FLUOROQUINOLONES* Hydrocodone-Acetaminophen *ANALGESICS - OPIOID* Zetia *ANTIHYPERLIPIDEMICS* Sulfa Drugs. Hives. Buprenex *ANALGESICS - OPIOID*. Tachycardia   Family History Cerebrovascular Accident. mother Cancer. brother Heart Disease. father and brother Rheumatoid Arthritis. grandmother mothers side Hypertension. mother   Social History Alcohol use. never consumed alcohol Drug/Alcohol Rehab (Previously). no Drug/Alcohol Rehab (Currently). no Marital status. widowed Illicit drug use. no Children. 3 Pain Contract. no Tobacco use. never smoker Living situation. live alone Exercise. Exercises weekly; does other Current work status. retired Post-Surgical Plans. Plan for [place] [facility]  Past Surgical History Hysterectomy. Date: 1985. complete (non-cancerous) Tonsillectomy. Date: 1942. Gallbladder Surgery. Date: 1994. laporoscopic Cataract Extraction-Bilateral  Medical History Osteoarthritis Osteoporosis Left Eye Blindness History of Tachycardia Urinary Frequency Hypoglycemia   Review of Systems General:Not Present- Chills, Fever, Night Sweats, Fatigue, Weight Gain, Weight Loss and Memory Loss. Skin:Not Present- Hives, Itching, Rash, Eczema and Lesions. HEENT:Not Present- Tinnitus, Headache, Double Vision, Visual Loss, Hearing Loss and Dentures. Respiratory:Not Present- Shortness of breath with exertion, Shortness of breath at rest, Allergies, Coughing up blood and Chronic Cough. Cardiovascular:Not Present- Chest Pain, Racing/skipping heartbeats,  Difficulty Breathing Lying Down, Murmur, Swelling and Palpitations. Gastrointestinal:Not Present- Bloody Stool, Heartburn, Abdominal Pain, Vomiting, Nausea, Constipation, Diarrhea, Difficulty Swallowing, Jaundice and Loss of appetitie. Female Genitourinary:Present- Urinary frequency. Not Present- Blood in Urine, Weak urinary stream, Discharge, Flank Pain, Incontinence, Painful Urination, Urgency, Urinary   Retention and Urinating at Night. Musculoskeletal:Present- Joint Pain. Not Present- Muscle Weakness, Muscle Pain, Joint Swelling, Back Pain, Morning Stiffness and Spasms. Neurological:Not Present- Tremor, Dizziness, Blackout spells, Paralysis, Difficulty with balance and Weakness. Psychiatric:Not Present- Insomnia.   Vitals Height: 62 in Pulse: 72 (Regular) Resp.: 16 (Unlabored) BP: 142/92 (Sitting, Left Arm, Standard)    Physical Exam The physical exam findings are as follows:  Note: Pateint is an 79 year old female with continued bilateral knee pain. Patient is accompanied today by her son and daughter.   General Mental Status - Alert, cooperative and good historian. General Appearance- pleasant. Not in acute distress. Orientation- Oriented X3. Build & Nutrition- Well nourished and Well developed.   Head and Neck Head- normocephalic, atraumatic . Neck Global Assessment- supple. no bruit auscultated on the right and no bruit auscultated on the left.   Eye Pupil- Bilateral- Regular and Round. Motion- Bilateral- EOMI.   Chest and Lung Exam Auscultation: Breath sounds:- clear at anterior chest wall and - clear at posterior chest wall. Adventitious sounds:- No Adventitious sounds.   Cardiovascular Auscultation:Rhythm- Regular rate and rhythm. Heart Sounds- S1 WNL and S2 WNL. Murmurs & Other Heart Sounds:Auscultation of the heart reveals - No Murmurs.   Abdomen Palpation/Percussion:Tenderness- Abdomen is non-tender to palpation.  Rigidity (guarding)- Abdomen is soft. Auscultation:Auscultation of the abdomen reveals - Bowel sounds normal.   Female Genitourinary Not done, not pertinent to present illness  Musculoskeletal On exam, she is a well-developed female, alert and oriented, in no apparent distress. Evaluation of her hips shows normal ROM, no discomfort. Her left knee shows no effusion. ROM on the left is about 5-125 with no tenderness or instability. Right knee shows no effusion. Range is about 5-120. Marked crepitus on ROM. Varus deformity. Tender medial greater than lateral with no instability noted.  RADIOGRAPHS: AP of both knees and lateral show advanced arthritic changes, right worse than left knee, bone on bone medial and patellofemoral.  Assessment & Plan Primary osteoarthritis of both knees (715.16) Impression: Bilateral Knees  Note: Plan is for a Left Total Knee Replacement by Dr. Aluisio.  Plan is to go to [facility] following the hospital stay.  PCP - Dr. Donna Gates - Patient has been seen preoperatively and felt to be stable for surgery. Cardiology - Dr. Michael Cooper - Patient has been seen preoperatively and felt to be stable for surgery.  Signed electronically by DREW L Kinneth Fujiwara, PA-C 

## 2012-06-27 ENCOUNTER — Inpatient Hospital Stay (HOSPITAL_COMMUNITY): Payer: Medicare Other | Admitting: Anesthesiology

## 2012-06-27 ENCOUNTER — Inpatient Hospital Stay (HOSPITAL_COMMUNITY)
Admission: RE | Admit: 2012-06-27 | Discharge: 2012-06-30 | DRG: 470 | Disposition: A | Discharge status: Skilled Nursing Facility | Payer: Medicare Other | Source: Ambulatory Visit | Attending: Orthopedic Surgery | Admitting: Orthopedic Surgery

## 2012-06-27 ENCOUNTER — Encounter (HOSPITAL_COMMUNITY): Payer: Self-pay

## 2012-06-27 ENCOUNTER — Encounter (HOSPITAL_COMMUNITY)
Admission: RE | Disposition: A | Discharge status: Skilled Nursing Facility | Payer: Self-pay | Source: Ambulatory Visit | Attending: Orthopedic Surgery

## 2012-06-27 ENCOUNTER — Encounter (HOSPITAL_COMMUNITY): Payer: Self-pay | Admitting: Anesthesiology

## 2012-06-27 DIAGNOSIS — H5440 Blindness, one eye, unspecified eye: Secondary | ICD-10-CM | POA: Diagnosis present

## 2012-06-27 DIAGNOSIS — M1710 Unilateral primary osteoarthritis, unspecified knee: Secondary | ICD-10-CM | POA: Diagnosis present

## 2012-06-27 DIAGNOSIS — IMO0002 Reserved for concepts with insufficient information to code with codable children: Principal | ICD-10-CM | POA: Diagnosis present

## 2012-06-27 DIAGNOSIS — Z96652 Presence of left artificial knee joint: Secondary | ICD-10-CM

## 2012-06-27 DIAGNOSIS — E871 Hypo-osmolality and hyponatremia: Secondary | ICD-10-CM

## 2012-06-27 DIAGNOSIS — M179 Osteoarthritis of knee, unspecified: Secondary | ICD-10-CM | POA: Diagnosis present

## 2012-06-27 DIAGNOSIS — Z01812 Encounter for preprocedural laboratory examination: Secondary | ICD-10-CM

## 2012-06-27 HISTORY — PX: TOTAL KNEE ARTHROPLASTY: SHX125

## 2012-06-27 LAB — TYPE AND SCREEN

## 2012-06-27 SURGERY — ARTHROPLASTY, KNEE, TOTAL
Anesthesia: Spinal | Site: Knee | Laterality: Left | Wound class: Clean

## 2012-06-27 MED ORDER — METOCLOPRAMIDE HCL 5 MG/ML IJ SOLN: 5.0000 mg | Freq: Three times a day (TID) | INTRAMUSCULAR | Status: DC | PRN

## 2012-06-27 MED ORDER — SODIUM CHLORIDE 0.9 % IR SOLN
Status: DC | PRN
  Administered 2012-06-27: 3000 mL

## 2012-06-27 MED ORDER — MENTHOL 3 MG MT LOZG: 1.0000 | LOZENGE | OROMUCOSAL | Status: DC | PRN

## 2012-06-27 MED ORDER — PAROXETINE HCL 10 MG PO TABS
10.0000 mg | ORAL_TABLET | Freq: Every day | ORAL | Status: DC
  Administered 2012-06-28 – 2012-06-30 (×3): 10 mg via ORAL
  Filled 2012-06-27 (×3): qty 1

## 2012-06-27 MED ORDER — ACETAMINOPHEN 10 MG/ML IV SOLN
1000.0000 mg | Freq: Four times a day (QID) | INTRAVENOUS | Status: AC
  Administered 2012-06-27 – 2012-06-28 (×4): 1000 mg via INTRAVENOUS
  Filled 2012-06-27 (×6): qty 100

## 2012-06-27 MED ORDER — BISACODYL 10 MG RE SUPP: 10.0000 mg | Freq: Every day | RECTAL | Status: DC | PRN

## 2012-06-27 MED ORDER — TRAMADOL HCL 50 MG PO TABS
50.0000 mg | ORAL_TABLET | Freq: Four times a day (QID) | ORAL | Status: DC | PRN
  Administered 2012-06-27 (×2): 50 mg via ORAL
  Administered 2012-06-28: 100 mg via ORAL
  Filled 2012-06-27: qty 1
  Filled 2012-06-27: qty 2
  Filled 2012-06-27: qty 1

## 2012-06-27 MED ORDER — HYDROMORPHONE HCL PF 1 MG/ML IJ SOLN
0.2500 mg | INTRAMUSCULAR | Status: DC | PRN
  Administered 2012-06-27 (×2): 0.5 mg via INTRAVENOUS

## 2012-06-27 MED ORDER — BUPIVACAINE LIPOSOME 1.3 % IJ SUSP
20.0000 mL | Freq: Once | INTRAMUSCULAR | Status: DC
  Filled 2012-06-27: qty 20

## 2012-06-27 MED ORDER — PHENYLEPHRINE HCL 10 MG/ML IJ SOLN
INTRAMUSCULAR | Status: DC | PRN
  Administered 2012-06-27 (×3): 80 ug via INTRAVENOUS
  Administered 2012-06-27: 40 ug via INTRAVENOUS

## 2012-06-27 MED ORDER — RIVAROXABAN 10 MG PO TABS
10.0000 mg | ORAL_TABLET | Freq: Every day | ORAL | Status: DC
  Administered 2012-06-28 – 2012-06-30 (×3): 10 mg via ORAL
  Filled 2012-06-27 (×5): qty 1

## 2012-06-27 MED ORDER — ACETAMINOPHEN 325 MG PO TABS: 650.0000 mg | ORAL_TABLET | Freq: Four times a day (QID) | ORAL | Status: DC | PRN

## 2012-06-27 MED ORDER — SODIUM CHLORIDE 0.9 % IJ SOLN
INTRAMUSCULAR | Status: DC | PRN
  Administered 2012-06-27: 50 mL via INTRAVENOUS

## 2012-06-27 MED ORDER — METOCLOPRAMIDE HCL 10 MG PO TABS: 5.0000 mg | ORAL_TABLET | Freq: Three times a day (TID) | ORAL | Status: DC | PRN

## 2012-06-27 MED ORDER — FLEET ENEMA 7-19 GM/118ML RE ENEM: 1.0000 | ENEMA | Freq: Once | RECTAL | Status: AC | PRN

## 2012-06-27 MED ORDER — ONDANSETRON HCL 4 MG/2ML IJ SOLN
INTRAMUSCULAR | Status: DC | PRN
  Administered 2012-06-27: 4 mg via INTRAVENOUS

## 2012-06-27 MED ORDER — METHOCARBAMOL 100 MG/ML IJ SOLN: 500.0000 mg | Freq: Four times a day (QID) | INTRAVENOUS | Status: DC | PRN

## 2012-06-27 MED ORDER — BUPIVACAINE IN DEXTROSE 0.75-8.25 % IT SOLN
INTRATHECAL | Status: DC | PRN
  Administered 2012-06-27: 2 mL via INTRATHECAL

## 2012-06-27 MED ORDER — 0.9 % SODIUM CHLORIDE (POUR BTL) OPTIME
TOPICAL | Status: DC | PRN
  Administered 2012-06-27: 1000 mL

## 2012-06-27 MED ORDER — ONDANSETRON HCL 4 MG/2ML IJ SOLN: 4.0000 mg | Freq: Four times a day (QID) | INTRAMUSCULAR | Status: DC | PRN

## 2012-06-27 MED ORDER — PREDNISOLONE ACETATE 1 % OP SUSP
1.0000 [drp] | Freq: Two times a day (BID) | OPHTHALMIC | Status: DC
  Administered 2012-06-28 – 2012-06-30 (×5): 1 [drp] via OPHTHALMIC
  Filled 2012-06-27: qty 1

## 2012-06-27 MED ORDER — PROPOFOL INFUSION 10 MG/ML OPTIME
INTRAVENOUS | Status: DC | PRN
  Administered 2012-06-27: 100 ug/kg/min via INTRAVENOUS

## 2012-06-27 MED ORDER — DEXAMETHASONE SODIUM PHOSPHATE 10 MG/ML IJ SOLN: 10.0000 mg | Freq: Once | INTRAMUSCULAR | Status: AC

## 2012-06-27 MED ORDER — LACTATED RINGERS IV SOLN
INTRAVENOUS | Status: DC
  Administered 2012-06-27: 12:00:00 via INTRAVENOUS
  Administered 2012-06-27: 1000 mL via INTRAVENOUS

## 2012-06-27 MED ORDER — HYDROMORPHONE HCL 2 MG PO TABS
2.0000 mg | ORAL_TABLET | ORAL | Status: DC | PRN
  Administered 2012-06-28: 2 mg via ORAL
  Administered 2012-06-28 (×2): 4 mg via ORAL
  Administered 2012-06-28 (×2): 2 mg via ORAL
  Administered 2012-06-29 (×4): 4 mg via ORAL
  Administered 2012-06-29: 2 mg via ORAL
  Administered 2012-06-30 (×3): 4 mg via ORAL
  Filled 2012-06-27 (×3): qty 2
  Filled 2012-06-27: qty 1
  Filled 2012-06-27 (×3): qty 2
  Filled 2012-06-27: qty 1
  Filled 2012-06-27 (×2): qty 2
  Filled 2012-06-27: qty 1
  Filled 2012-06-27: qty 2
  Filled 2012-06-27: qty 1

## 2012-06-27 MED ORDER — DIPHENHYDRAMINE HCL 12.5 MG/5ML PO ELIX
12.5000 mg | ORAL_SOLUTION | ORAL | Status: DC | PRN
  Administered 2012-06-28 (×2): 12.5 mg via ORAL
  Filled 2012-06-27 (×2): qty 5

## 2012-06-27 MED ORDER — METOPROLOL TARTRATE 25 MG PO TABS
25.0000 mg | ORAL_TABLET | Freq: Every evening | ORAL | Status: DC | PRN
  Filled 2012-06-27: qty 1

## 2012-06-27 MED ORDER — DOCUSATE SODIUM 100 MG PO CAPS
100.0000 mg | ORAL_CAPSULE | Freq: Two times a day (BID) | ORAL | Status: DC
  Administered 2012-06-28 – 2012-06-30 (×5): 100 mg via ORAL

## 2012-06-27 MED ORDER — DEXAMETHASONE 6 MG PO TABS
10.0000 mg | ORAL_TABLET | Freq: Once | ORAL | Status: AC
  Administered 2012-06-28: 10 mg via ORAL
  Filled 2012-06-27: qty 1

## 2012-06-27 MED ORDER — ONDANSETRON HCL 4 MG PO TABS: 4.0000 mg | ORAL_TABLET | Freq: Four times a day (QID) | ORAL | Status: DC | PRN

## 2012-06-27 MED ORDER — HYDROMORPHONE HCL PF 1 MG/ML IJ SOLN
0.5000 mg | INTRAMUSCULAR | Status: DC | PRN
  Administered 2012-06-27 (×2): 0.5 mg via INTRAVENOUS
  Filled 2012-06-27 (×2): qty 1

## 2012-06-27 MED ORDER — ACETAMINOPHEN 10 MG/ML IV SOLN
1000.0000 mg | Freq: Once | INTRAVENOUS | Status: AC
  Administered 2012-06-27: 1000 mg via INTRAVENOUS

## 2012-06-27 MED ORDER — CEFAZOLIN SODIUM-DEXTROSE 2-3 GM-% IV SOLR
2.0000 g | INTRAVENOUS | Status: AC
  Administered 2012-06-27: 2 g via INTRAVENOUS

## 2012-06-27 MED ORDER — BUPIVACAINE LIPOSOME 1.3 % IJ SUSP
INTRAMUSCULAR | Status: DC | PRN
  Administered 2012-06-27: 20 mL

## 2012-06-27 MED ORDER — ACETAMINOPHEN 650 MG RE SUPP: 650.0000 mg | Freq: Four times a day (QID) | RECTAL | Status: DC | PRN

## 2012-06-27 MED ORDER — METHOCARBAMOL 500 MG PO TABS
500.0000 mg | ORAL_TABLET | Freq: Four times a day (QID) | ORAL | Status: DC | PRN
  Administered 2012-06-29 – 2012-06-30 (×2): 500 mg via ORAL
  Filled 2012-06-27 (×2): qty 1

## 2012-06-27 MED ORDER — POLYETHYLENE GLYCOL 3350 17 G PO PACK: 17.0000 g | PACK | Freq: Every day | ORAL | Status: DC | PRN

## 2012-06-27 MED ORDER — CEFAZOLIN SODIUM 1-5 GM-% IV SOLN
1.0000 g | Freq: Four times a day (QID) | INTRAVENOUS | Status: AC
  Administered 2012-06-27 – 2012-06-28 (×2): 1 g via INTRAVENOUS
  Filled 2012-06-27 (×2): qty 50

## 2012-06-27 MED ORDER — PHENOL 1.4 % MT LIQD: 1.0000 | OROMUCOSAL | Status: DC | PRN

## 2012-06-27 MED ORDER — DEXTROSE-NACL 5-0.9 % IV SOLN
INTRAVENOUS | Status: DC
  Administered 2012-06-27 – 2012-06-28 (×2): via INTRAVENOUS

## 2012-06-27 MED ORDER — SODIUM CHLORIDE 0.9 % IV SOLN: INTRAVENOUS | Status: DC

## 2012-06-27 MED ORDER — FENTANYL CITRATE 0.05 MG/ML IJ SOLN
INTRAMUSCULAR | Status: DC | PRN
  Administered 2012-06-27 (×2): 50 ug via INTRAVENOUS

## 2012-06-27 MED ORDER — PROMETHAZINE HCL 25 MG/ML IJ SOLN: 6.2500 mg | INTRAMUSCULAR | Status: DC | PRN

## 2012-06-27 SURGICAL SUPPLY — 55 items
BAG ZIPLOCK 12X15 (MISCELLANEOUS) ×2 IMPLANT
BANDAGE ELASTIC 6 VELCRO ST LF (GAUZE/BANDAGES/DRESSINGS) ×2 IMPLANT
BANDAGE ESMARK 6X9 LF (GAUZE/BANDAGES/DRESSINGS) ×1 IMPLANT
BLADE SAG 18X100X1.27 (BLADE) ×2 IMPLANT
BLADE SAW SGTL 11.0X1.19X90.0M (BLADE) ×2 IMPLANT
BNDG CMPR 9X6 STRL LF SNTH (GAUZE/BANDAGES/DRESSINGS) ×1
BNDG ESMARK 6X9 LF (GAUZE/BANDAGES/DRESSINGS) ×2
BOWL SMART MIX CTS (DISPOSABLE) ×2 IMPLANT
CEMENT HV SMART SET (Cement) ×4 IMPLANT
CLOTH BEACON ORANGE TIMEOUT ST (SAFETY) ×2 IMPLANT
CLSR STERI-STRIP ANTIMIC 1/2X4 (GAUZE/BANDAGES/DRESSINGS) ×2 IMPLANT
CUFF TOURN SGL QUICK 34 (TOURNIQUET CUFF) ×1
CUFF TRNQT CYL 34X4X40X1 (TOURNIQUET CUFF) ×1 IMPLANT
DRAPE EXTREMITY T 121X128X90 (DRAPE) ×2 IMPLANT
DRAPE POUCH INSTRU U-SHP 10X18 (DRAPES) ×2 IMPLANT
DRAPE U-SHAPE 47X51 STRL (DRAPES) ×2 IMPLANT
DRSG ADAPTIC 3X8 NADH LF (GAUZE/BANDAGES/DRESSINGS) ×2 IMPLANT
DURAPREP 26ML APPLICATOR (WOUND CARE) ×2 IMPLANT
ELECT REM PT RETURN 9FT ADLT (ELECTROSURGICAL) ×2
ELECTRODE REM PT RTRN 9FT ADLT (ELECTROSURGICAL) ×1 IMPLANT
EVACUATOR 1/8 PVC DRAIN (DRAIN) ×2 IMPLANT
FACESHIELD LNG OPTICON STERILE (SAFETY) ×10 IMPLANT
GLOVE BIO SURGEON STRL SZ7.5 (GLOVE) ×2 IMPLANT
GLOVE BIO SURGEON STRL SZ8 (GLOVE) ×2 IMPLANT
GLOVE BIOGEL PI IND STRL 8 (GLOVE) ×2 IMPLANT
GLOVE BIOGEL PI INDICATOR 8 (GLOVE) ×2
GLOVE SURG SS PI 6.5 STRL IVOR (GLOVE) ×4 IMPLANT
GOWN STRL NON-REIN LRG LVL3 (GOWN DISPOSABLE) ×4 IMPLANT
GOWN STRL REIN XL XLG (GOWN DISPOSABLE) ×2 IMPLANT
HANDPIECE INTERPULSE COAX TIP (DISPOSABLE) ×1
IMMOBILIZER KNEE 20 (SOFTGOODS) ×2
IMMOBILIZER KNEE 20 THIGH 36 (SOFTGOODS) ×1 IMPLANT
KIT BASIN OR (CUSTOM PROCEDURE TRAY) ×2 IMPLANT
MANIFOLD NEPTUNE II (INSTRUMENTS) ×2 IMPLANT
NDL SAFETY ECLIPSE 18X1.5 (NEEDLE) ×1 IMPLANT
NEEDLE HYPO 18GX1.5 SHARP (NEEDLE) ×1
NEEDLE SPNL 20GX3.5 QUINCKE YW (NEEDLE) ×2 IMPLANT
NS IRRIG 1000ML POUR BTL (IV SOLUTION) ×2 IMPLANT
PACK TOTAL JOINT (CUSTOM PROCEDURE TRAY) ×2 IMPLANT
PAD ABD 7.5X8 STRL (GAUZE/BANDAGES/DRESSINGS) ×2 IMPLANT
PADDING CAST COTTON 6X4 STRL (CAST SUPPLIES) ×2 IMPLANT
POSITIONER SURGICAL ARM (MISCELLANEOUS) ×2 IMPLANT
SET HNDPC FAN SPRY TIP SCT (DISPOSABLE) ×1 IMPLANT
SPONGE GAUZE 4X4 12PLY (GAUZE/BANDAGES/DRESSINGS) ×2 IMPLANT
STRIP CLOSURE SKIN 1/2X4 (GAUZE/BANDAGES/DRESSINGS) ×4 IMPLANT
SUCTION FRAZIER 12FR DISP (SUCTIONS) ×2 IMPLANT
SUT MNCRL AB 4-0 PS2 18 (SUTURE) ×2 IMPLANT
SUT VIC AB 2-0 CT1 27 (SUTURE) ×6
SUT VIC AB 2-0 CT1 TAPERPNT 27 (SUTURE) ×3 IMPLANT
SUT VLOC 180 0 24IN GS25 (SUTURE) ×2 IMPLANT
SYR 50ML LL SCALE MARK (SYRINGE) ×2 IMPLANT
TOWEL OR 17X26 10 PK STRL BLUE (TOWEL DISPOSABLE) ×4 IMPLANT
TRAY FOLEY CATH 14FRSI W/METER (CATHETERS) ×2 IMPLANT
WATER STERILE IRR 1500ML POUR (IV SOLUTION) ×2 IMPLANT
WRAP KNEE MAXI GEL POST OP (GAUZE/BANDAGES/DRESSINGS) ×4 IMPLANT

## 2012-06-27 NOTE — Anesthesia Postprocedure Evaluation (Signed)
  Anesthesia Post-op Note  Patient: [name] [name]  Procedure(s) Performed: Procedure(s) (LRB): LEFT TOTAL KNEE ARTHROPLASTY (Left)  Patient Location: PACU  Anesthesia Type: Spinal  Level of Consciousness: awake and alert   Airway and Oxygen Therapy: Patient Spontanous Breathing  Post-op Pain: mild  Post-op Assessment: Post-op Vital signs reviewed, Patient's Cardiovascular Status Stable, Respiratory Function Stable, Patent Airway and No signs of Nausea or vomiting  Last Vitals:  Filed Vitals:   06/27/12 1400  BP:   Pulse: 77  Temp:   Resp: 15    Post-op Vital Signs: stable   Complications: No apparent anesthesia complications. Moving feet bilaterally.

## 2012-06-27 NOTE — H&P (View-Only) (Signed)
[name] [name]  DOB: November 18, 1924 Married / Language: English / Race: White Female  Date of Admission:  06/27/2012  Chief Complaint: Left Knee Pain  History of Present Illness The patient is a 79 year old female who comes in today for a preoperative History and Physical. The patient is scheduled for a left total knee arthroplasty to be performed by Dr. Gus Rankin. Aluisio, MD at [facility] on 07/13/2012. The patient is a 79 year old female who presents with knee complaints. The patient is seen today changing care from Dr. Darrelyn Hillock. The patient reports bilateral knee symptoms including: pain, swelling and stiffness which began year(s) ago without any known injury. The patient describes the severity of the symptoms as moderate in severity.The patient feels that the symptoms are worsening. The patient has the current diagnosis of knee osteoarthritis. Previous work-up for this problem has included knee x-rays. Past treatment for this problem has included application of ice and intra-articular injection of corticosteroids (last injection was on 01/25/12. Injections have usually helped for about a month). Current treatment includes application of heat and nonsteroidal anti-inflammatory drugs (Advil). She had previously been scheduled for a total knee arthroplasty by Dr. Darrelyn Hillock but wanted to switch over to me, as I have operated on some friends. She says her knees hurt at all times. It limits what she can and cannot do. Other than the knee, she has been extremely active and likes to do a lot of gardening. The knee has prevented her from doing that. Her overall health has been excellent. They have been treated conservatively in the past for the above stated problem and despite conservative measures, they continue to have progressive pain and severe functional limitations and dysfunction. They have failed non-operative management including home exercise, medications, and injections. It is  felt that they would benefit from undergoing total joint replacement. Risks and benefits of the procedure have been discussed with the patient and they elect to proceed with surgery. There are no active contraindications to surgery such as ongoing infection or rapidly progressive neurological disease.   Problem List Primary osteoarthritis of both knees (715.16)   Allergies Levaquin *FLUOROQUINOLONES* Hydrocodone-Acetaminophen *ANALGESICS - OPIOID* Zetia *ANTIHYPERLIPIDEMICS* Sulfa Drugs. Hives. Buprenex *ANALGESICS - OPIOID*. Tachycardia   Family History Cerebrovascular Accident. mother Cancer. brother Heart Disease. father and brother Rheumatoid Arthritis. grandmother mothers side Hypertension. mother   Social History Alcohol use. never consumed alcohol Drug/Alcohol Rehab (Previously). no Drug/Alcohol Rehab (Currently). no Marital status. widowed Illicit drug use. no Children. 3 Pain Contract. no Tobacco use. never smoker Living situation. live alone Exercise. Exercises weekly; does other Current work status. retired Museum/gallery exhibitions officer. Plan for [employer]  Past Surgical History Hysterectomy. Date: 85. complete (non-cancerous) Tonsillectomy. Date: 65. Gallbladder Surgery. Date: 25. laporoscopic Cataract Extraction-Bilateral  Medical History Osteoarthritis Osteoporosis Left Eye Blindness History of Tachycardia Urinary Frequency Hypoglycemia   Review of Systems General:Not Present- Chills, Fever, Night Sweats, Fatigue, Weight Gain, Weight Loss and Memory Loss. Skin:Not Present- Hives, Itching, Rash, Eczema and Lesions. HEENT:Not Present- Tinnitus, Headache, Double Vision, Visual Loss, Hearing Loss and Dentures. Respiratory:Not Present- Shortness of breath with exertion, Shortness of breath at rest, Allergies, Coughing up blood and Chronic Cough. Cardiovascular:Not Present- Chest Pain, Racing/skipping heartbeats,  Difficulty Breathing Lying Down, Murmur, Swelling and Palpitations. Gastrointestinal:Not Present- Bloody Stool, Heartburn, Abdominal Pain, Vomiting, Nausea, Constipation, Diarrhea, Difficulty Swallowing, Jaundice and Loss of appetitie. Female Genitourinary:Present- Urinary frequency. Not Present- Blood in Urine, Weak urinary stream, Discharge, Flank Pain, Incontinence, Painful Urination, Urgency, Urinary  Retention and Urinating at Night. Musculoskeletal:Present- Joint Pain. Not Present- Muscle Weakness, Muscle Pain, Joint Swelling, Back Pain, Morning Stiffness and Spasms. Neurological:Not Present- Tremor, Dizziness, Blackout spells, Paralysis, Difficulty with balance and Weakness. Psychiatric:Not Present- Insomnia.   Vitals Height: 62 in Pulse: 72 (Regular) Resp.: 16 (Unlabored) BP: 142/92 (Sitting, Left Arm, Standard)    Physical Exam The physical exam findings are as follows:  Note: Pateint is an 79 year old female with continued bilateral knee pain. Patient is accompanied today by her son and daughter.   General Mental Status - Alert, cooperative and good historian. General Appearance- pleasant. Not in acute distress. Orientation- Oriented X3. Build & Nutrition- Well nourished and Well developed.   Head and Neck Head- normocephalic, atraumatic . Neck Global Assessment- supple. no bruit auscultated on the right and no bruit auscultated on the left.   Eye Pupil- Bilateral- Regular and Round. Motion- Bilateral- EOMI.   Chest and Lung Exam Auscultation: Breath sounds:- clear at anterior chest wall and - clear at posterior chest wall. Adventitious sounds:- No Adventitious sounds.   Cardiovascular Auscultation:Rhythm- Regular rate and rhythm. Heart Sounds- S1 WNL and S2 WNL. Murmurs & Other Heart Sounds:Auscultation of the heart reveals - No Murmurs.   Abdomen Palpation/Percussion:Tenderness- Abdomen is non-tender to palpation.  Rigidity (guarding)- Abdomen is soft. Auscultation:Auscultation of the abdomen reveals - Bowel sounds normal.   Female Genitourinary Not done, not pertinent to present illness  Musculoskeletal On exam, she is a well-developed female, alert and oriented, in no apparent distress. Evaluation of her hips shows normal ROM, no discomfort. Her left knee shows no effusion. ROM on the left is about 5-125 with no tenderness or instability. Right knee shows no effusion. Range is about 5-120. Marked crepitus on ROM. Varus deformity. Tender medial greater than lateral with no instability noted.  RADIOGRAPHS: AP of both knees and lateral show advanced arthritic changes, right worse than left knee, bone on bone medial and patellofemoral.  Assessment & Plan Primary osteoarthritis of both knees (715.16) Impression: Bilateral Knees  Note: Plan is for a Left Total Knee Replacement by Dr. Lequita Halt.  Plan is to go to [facility] following the hospital stay.  PCP - Dr. Shaune Pollack - Patient has been seen preoperatively and felt to be stable for surgery. Cardiology - Dr. Tonny Bollman - Patient has been seen preoperatively and felt to be stable for surgery.  Signed electronically by Roberts Gaudy, PA-C

## 2012-06-27 NOTE — Op Note (Signed)
Pre-operative diagnosis- Osteoarthritis  Left knee(s)  Post-operative diagnosis- Osteoarthritis Left knee(s)  Procedure-  Left  Total Knee Arthroplasty  Surgeon- Gus Rankin. Jameon Deller, MD  Assistant- Dimitri Ped, PA-C   Anesthesia-  Spinal EBL-* No blood loss amount entered *  Drains Hemovac  Tourniquet time-  Total Tourniquet Time Documented: Thigh (Left) - 32 minutes Total: Thigh (Left) - 32 minutes    Complications- None  Condition-PACU - hemodynamically stable.   Brief Clinical Note  [name] [name] is a 79 y.o. year old female with end stage OA of her left knee with progressively worsening pain and dysfunction. She has constant pain, with activity and at rest and significant functional deficits with difficulties even with ADLs. She has had extensive non-op management including analgesics, injections of cortisone, and home exercise program, but remains in significant pain with significant dysfunction. Radiographs show bone on bone arthritis medial and aptellofemoral with varus deformity. She presents now for left Total Knee Arthroplasty.    Procedure in detail---   The patient is brought into the operating room and positioned supine on the operating table. After successful administration of  Spinal,   a tourniquet is placed high on the  Left thigh(s) and the lower extremity is prepped and draped in the usual sterile fashion. Time out is performed by the operating team and then the  Left lower extremity is wrapped in Esmarch, knee flexed and the tourniquet inflated to 300 mmHg.       A midline incision is made with a ten blade through the subcutaneous tissue to the level of the extensor mechanism. A fresh blade is used to make a medial parapatellar arthrotomy. Soft tissue over the proximal medial tibia is subperiosteally elevated to the joint line with a knife and into the semimembranosus bursa with a Cobb elevator. Soft tissue over the proximal lateral tibia is elevated with attention  being paid to avoiding the patellar tendon on the tibial tubercle. The patella is everted, knee flexed 90 degrees and the ACL and PCL are removed. Findings are bone on bone medial and patellofemoral with large medial osteophytes.        The drill is used to create a starting hole in the distal femur and the canal is thoroughly irrigated with sterile saline to remove the fatty contents. The 5 degree Left  valgus alignment guide is placed into the femoral canal and the distal femoral cutting block is pinned to remove 10 mm off the distal femur. Resection is made with an oscillating saw.      The tibia is subluxed forward and the menisci are removed. The extramedullary alignment guide is placed referencing proximally at the medial aspect of the tibial tubercle and distally along the second metatarsal axis and tibial crest. The block is pinned to remove 2mm off the more deficient medial  side. Resection is made with an oscillating saw. Size 3is the most appropriate size for the tibia and the proximal tibia is prepared with the modular drill and keel punch for that size.      The femoral sizing guide is placed and size 3 is most appropriate. Rotation is marked off the epicondylar axis and confirmed by creating a rectangular flexion gap at 90 degrees. The size 3 cutting block is pinned in this rotation and the anterior, posterior and chamfer cuts are made with the oscillating saw. The intercondylar block is then placed and that cut is made.      Trial size 3 tibial component, trial size 3  posterior stabilized femur and a 10  mm posterior stabilized rotating platform insert trial is placed. Full extension is achieved with excellent varus/valgus and anterior/posterior balance throughout full range of motion. The patella is everted and thickness measured to be 22  mm. Free hand resection is taken to 12 mm, a 35 template is placed, lug holes are drilled, trial patella is placed, and it tracks normally. Osteophytes are  removed off the posterior femur with the trial in place. All trials are removed and the cut bone surfaces prepared with pulsatile lavage. Cement is mixed and once ready for implantation, the size 3 tibial implant, size  3 posterior stabilized femoral component, and the size 35 patella are cemented in place and the patella is held with the clamp. The trial insert is placed and the knee held in full extension. The Exparel (20 ml mixed with 50 ml saline) is injected into the extensor mechanism, posterior capsule, medial and lateral gutters and subcutaneous tissues.  All extruded cement is removed and once the cement is hard the permanent 10 mm posterior stabilized rotating platform insert is placed into the tibial tray.      The wound is copiously irrigated with saline solution and the extensor mechanism closed over a hemovac drain with #1 PDS suture. The tourniquet is released for a total tourniquet time of 32  minutes. Flexion against gravity is 140 degrees and the patella tracks normally. Subcutaneous tissue is closed with 2.0 vicryl and subcuticular with running 4.0 Monocryl. The incision is cleaned and dried and steri-strips and a bulky sterile dressing are applied. The limb is placed into a knee immobilizer and the patient is awakened and transported to recovery in stable condition.      Please note that a surgical assistant was a medical necessity for this procedure in order to perform it in a safe and expeditious manner. Surgical assistant was necessary to retract the ligaments and vital neurovascular structures to prevent injury to them and also necessary for proper positioning of the limb to allow for anatomic placement of the prosthesis.   Gus Rankin [name] Treadwell, MD    06/27/2012, 12:51 PM

## 2012-06-27 NOTE — Anesthesia Preprocedure Evaluation (Addendum)
Anesthesia Evaluation  Patient identified by MRN, date of birth, ID band Patient awake    Reviewed: Allergy & Precautions, H&P , NPO status , Patient's Chart, lab work & pertinent test results, reviewed documented beta blocker date and time   Airway Mallampati: II TM Distance: >3 FB Neck ROM: full    Dental no notable dental hx.    Pulmonary neg pulmonary ROS,  breath sounds clear to auscultation  Pulmonary exam normal       Cardiovascular Exercise Tolerance: Good + dysrhythmias Supra Ventricular Tachycardia Rhythm:regular Rate:Normal     Neuro/Psych negative neurological ROS  negative psych ROS   GI/Hepatic negative GI ROS, Neg liver ROS,   Endo/Other  negative endocrine ROS  Renal/GU negative Renal ROS  negative genitourinary   Musculoskeletal   Abdominal (+) + obese,   Peds  Hematology negative hematology ROS (+)   Anesthesia Other Findings   Reproductive/Obstetrics negative OB ROS                           Anesthesia Physical Anesthesia Plan  ASA: II  Anesthesia Plan: Spinal   Post-op Pain Management:    Induction: Intravenous  Airway Management Planned:   Additional Equipment:   Intra-op Plan:   Post-operative Plan: Extubation in OR  Informed Consent: I have reviewed the patients History and Physical, chart, labs and discussed the procedure including the risks, benefits and alternatives for the proposed anesthesia with the patient or authorized representative who has indicated his/her understanding and acceptance.   Dental Advisory Given  Plan Discussed with: CRNA  Anesthesia Plan Comments: (Discussed general versus spinal. Discussed risks/benefits of spinal including headache, backache, failure, bleeding, infection, and nerve damage. Patient consents to spinal. Questions answered. Coagulation studies and platelet count acceptable.)       Anesthesia Quick  Evaluation

## 2012-06-27 NOTE — Interval H&P Note (Signed)
History and Physical Interval Note:  06/27/2012 9:35 AM  [name] [name]  has presented today for surgery, with the diagnosis of OA OF LEFT KNEE  The various methods of treatment have been discussed with the patient and family. After consideration of risks, benefits and other options for treatment, the patient has consented to  Procedure(s): LEFT TOTAL KNEE ARTHROPLASTY (Left) as a surgical intervention .  The patient's history has been reviewed, patient examined, no change in status, stable for surgery.  I have reviewed the patient's chart and labs.  Questions were answered to the patient's satisfaction.     Loanne Drilling

## 2012-06-27 NOTE — Preoperative (Signed)
Beta Blockers   Reason not to administer Beta Blockers:Not Applicable 

## 2012-06-27 NOTE — Transfer of Care (Signed)
Immediate Anesthesia Transfer of Care Note  Patient: [name] [name]  Procedure(s) Performed: Procedure(s): LEFT TOTAL KNEE ARTHROPLASTY (Left)  Patient Location: PACU  Anesthesia Type:Spinal  Level of Consciousness: awake, alert  and oriented  Airway & Oxygen Therapy: Patient Spontanous Breathing and Patient connected to face mask oxygen  Post-op Assessment: Report given to PACU RN and Post -op Vital signs reviewed and stable  Post vital signs: Reviewed and stable  Complications: No apparent anesthesia complications

## 2012-06-27 NOTE — Anesthesia Procedure Notes (Addendum)
Anesthesia Regional Block:   Narrative:    Spinal  Patient location during procedure: OR Staffing Anesthesiologist: Azell Der Performed by: anesthesiologist  Preanesthetic Checklist Completed: patient identified, site marked, surgical consent, pre-op evaluation, timeout performed, IV checked, risks and benefits discussed and monitors and equipment checked Spinal Block Patient position: sitting Prep: Betadine Patient monitoring: heart rate, continuous pulse ox and blood pressure Approach: midline Location: L3-4 Injection technique: single-shot Needle Needle type: Spinocan  Needle gauge: 22 G Needle length: 9 cm Additional Notes Expiration date of kit checked and confirmed. Patient tolerated procedure well, without complications.

## 2012-06-27 NOTE — Plan of Care (Signed)
Problem: Consults Goal: Diagnosis- Total Joint Replacement Outcome: Completed/Met Date Met:  06/27/12 Primary Total Knee     

## 2012-06-28 ENCOUNTER — Encounter (HOSPITAL_COMMUNITY): Payer: Self-pay | Admitting: Orthopedic Surgery

## 2012-06-28 DIAGNOSIS — E871 Hypo-osmolality and hyponatremia: Secondary | ICD-10-CM

## 2012-06-28 LAB — BASIC METABOLIC PANEL
Chloride: 94 mEq/L — ABNORMAL LOW (ref 96–112)
GFR calc Af Amer: >90 mL/min (ref >90)
GFR calc non Af Amer: 85 mL/min — ABNORMAL LOW (ref >90)
Potassium: 3.8 mEq/L (ref 3.5–5.1)
Sodium: 128 mEq/L — ABNORMAL LOW (ref 135–145)

## 2012-06-28 LAB — CBC
HCT: 34.3 % — ABNORMAL LOW (ref 36.0–46.0)
MCHC: 33.8 g/dL (ref 30.0–36.0)
Platelets: 167 10*3/uL (ref 150–400)
RDW: 13.3 % (ref 11.5–15.5)
WBC: 8.1 10*3/uL (ref 4.0–10.5)

## 2012-06-28 NOTE — Progress Notes (Signed)
   Subjective: 1 Day Post-Op Procedure(s) (LRB): LEFT TOTAL KNEE ARTHROPLASTY (Left) Patient reports pain as mild and moderate last night. Patient seen in rounds with Dr. Lequita Halt.  Daughter in room at bedside. Patient is well, and has had no acute complaints or problems We will start therapy today.  Plan is to go Skilled nursing facility after hospital stay.  Wants to look into [facility].  Objective: Vital signs in last 24 hours: Temp:  [97.5 F (36.4 C)-98.5 F (36.9 C)] 98 F (36.7 C) (04/08 0515) Pulse Rate:  [71-91] 71 (04/08 0515) Resp:  [12-22] 16 (04/08 0515) BP: (109-196)/(51-110) 148/68 mmHg (04/08 0515) SpO2:  [94 %-100 %] 98 % (04/08 0515) Weight:  [79.379 kg (175 lb)] 79.379 kg (175 lb) (04/07 1430)  Intake/Output from previous day:  Intake/Output Summary (Last 24 hours) at 06/28/12 0827 Last data filed at 06/28/12 0558  Gross per 24 hour  Intake 3058.75 ml  Output   3370 ml  Net -311.25 ml    Intake/Output this shift:    Labs:  Recent Labs  06/28/12 0500  HGB 11.6*    Recent Labs  06/28/12 0500  WBC 8.1  RBC 3.72*  HCT 34.3*  PLT 167    Recent Labs  06/28/12 0500  NA 128*  K 3.8  CL 94*  CO2 31  BUN 10  CREATININE 0.51  GLUCOSE 139*  CALCIUM 8.1*   No results found for this basename: LABPT, INR,  in the last 72 hours  EXAM General - Patient is Alert, Appropriate and Oriented Extremity - Neurovascular intact Sensation intact distally Dorsiflexion/Plantar flexion intact No cellulitis present Dressing - dressing C/D/I Motor Function - intact, moving foot and toes well on exam.  Hemovac came out earlier this morning.  Past Medical History  Diagnosis Date  . Arthritis   . Blind left eye   . Dysrhythmia   . Varicose veins     both legs and left fooot at arch  . Complication of anesthesia     sensitive to meds, bp can fall    Assessment/Plan: 1 Day Post-Op Procedure(s) (LRB): LEFT TOTAL KNEE ARTHROPLASTY  (Left) Principal Problem:   OA (osteoarthritis) of knee Active Problems:   Postop Hyponatremia  Estimated body mass index is 32 kg/(m^2) as calculated from the following:   Height as of this encounter: 5\' 2"  (1.575 m).   Weight as of this encounter: 79.379 kg (175 lb). Advance diet Up with therapy Discharge to SNF  DVT Prophylaxis - Xarelto, Aspirin 81 mg on hold Weight-Bearing as tolerated to left leg No vaccines. D/C O2 and Pulse OX and try on Room [address]  Patrica Duel 06/28/2012, 8:27 AM

## 2012-06-28 NOTE — Evaluation (Signed)
Physical Therapy Evaluation Patient Details Name: [name] [name] MRN: [id number] DOB: 1925-03-04 Today's Date: 06/28/2012 Time: 0827-0903 PT Time Calculation (min): 36 min  PT Assessment / Plan / Recommendation Clinical Impression  79 y.o. female admitted for L TKA. Today she ambulated 6' with RW and min A. Initiated ther ex for LLE. Good progress expected. Pt plans to DC to [facility] for rehab, then home. She would benefit from acute PT to maximize safety and independence with mobiility.     PT Assessment  Patient needs continued PT services    Follow Up Recommendations  SNF    Does the patient have the potential to tolerate intense rehabilitation      Barriers to Discharge None      Equipment Recommendations  Rolling walker with 5" wheels    Recommendations for Other Services OT consult   Frequency 7X/week    Precautions / Restrictions Precautions Precautions: Knee Restrictions Weight Bearing Restrictions: No Other Position/Activity Restrictions: WBAT   Pertinent Vitals/Pain *4/10 L knee with walking Premedicated, ice applied**      Mobility  Bed Mobility Bed Mobility: Supine to Sit Supine to Sit: 4: Min assist Details for Bed Mobility Assistance: min A to elevate trunk, and support LLE Transfers Transfers: Sit to Stand;Stand to Sit Sit to Stand: 3: Mod assist;From bed;With upper extremity assist Stand to Sit: 3: Mod assist;To chair/3-in-1;With upper extremity assist;With armrests Details for Transfer Assistance: VCs hand placement Ambulation/Gait Ambulation/Gait Assistance: 4: Min assist Ambulation Distance (Feet): 6 Feet Assistive device: Rolling walker Gait Pattern: Step-to pattern;Antalgic;Decreased step length - right;Decreased stance time - left General Gait Details: VCs sequencing, distance limited by pain    Exercises Total Joint Exercises Ankle Circles/Pumps: AROM;Both;10 reps;Supine Quad Sets: AROM;Both;5 reps;Seated Heel Slides: AAROM;Left;10  reps;Supine Hip ABduction/ADduction: AAROM;Left;10 reps;Supine   PT Diagnosis: Difficulty walking;Acute pain  PT Problem List: Decreased strength;Decreased range of motion;Decreased activity tolerance;Decreased mobility;Pain;Decreased knowledge of use of DME PT Treatment Interventions: Gait training;DME instruction;Therapeutic activities;Therapeutic exercise;Functional mobility training;Patient/family education   PT Goals Acute Rehab PT Goals PT Goal Formulation: With patient/family Time For Goal Achievement: 07/12/12 Potential to Achieve Goals: Good Pt will go Supine/Side to Sit: with modified independence PT Goal: Supine/Side to Sit - Progress: Goal set today Pt will go Sit to Stand: with supervision PT Goal: Sit to Stand - Progress: Goal set today Pt will Ambulate: 51 - 150 feet;with supervision;with rolling walker PT Goal: Ambulate - Progress: Goal set today Pt will Perform Home Exercise Program: with min assist PT Goal: Perform Home Exercise Program - Progress: Goal set today  Visit Information  Last PT Received On: 06/28/12 Assistance Needed: +1    Subjective Data  Subjective: I was going to have my R knee replaced but then my L knee started giving out.  Patient Stated Goal: return to water aerobics   Prior Functioning  Home Living Lives With: Alone Available Help at Discharge: Skilled Nursing Facility Home Adaptive Equipment: Wheelchair - manual;Bedside commode/3-in-1 Prior Function Level of Independence: Independent Able to Take Stairs?: Yes Vocation: Retired Comments: Blind L Lexicographer: HOH    Cognition  Cognition Overall Cognitive Status: Appears within functional limits for tasks assessed/performed Arousal/Alertness: Awake/alert Orientation Level: Appears intact for tasks assessed Behavior During Session: [facility] for tasks performed    Extremity/Trunk Assessment Right Upper Extremity Assessment RUE ROM/Strength/Tone: [facility] for tasks  assessed Left Upper Extremity Assessment LUE ROM/Strength/Tone: WFL for tasks assessed Right Lower Extremity Assessment RLE ROM/Strength/Tone: Within functional levels RLE Sensation: WFL - Light Touch  RLE Coordination: WFL - gross/fine motor Left Lower Extremity Assessment LLE ROM/Strength/Tone: Deficits LLE ROM/Strength/Tone Deficits: ankle WNL, knee flexion approx 35* AAROM limited by pain, hip +2/5 LLE Sensation: WFL - Light Touch LLE Coordination: WFL - gross/fine motor Trunk Assessment Trunk Assessment: Normal   Balance Balance Balance Assessed: Yes Static Sitting Balance Static Sitting - Balance Support: No upper extremity supported;Feet supported Static Sitting - Level of Assistance: 6: Modified independent (Device/Increase time) Static Sitting - Comment/# of Minutes: 3  End of Session PT - End of Session Equipment Utilized During Treatment: Gait belt Activity Tolerance: Patient tolerated treatment well Patient left: in chair;with call bell/phone within reach;with family/visitor present Nurse Communication: Mobility status CPM Left Knee CPM Left Knee: Off  GP     Ralene Bathe Kistler 06/28/2012, 9:13 AM [phone]

## 2012-06-28 NOTE — Progress Notes (Signed)
Clinical Social Work Department CLINICAL SOCIAL WORK PLACEMENT NOTE 06/28/2012  Patient:  [name] [name], [name] [name]  Account Number:  [id number] Admit date:  06/27/2012  Clinical Social Worker:  [name], LCSW  Date/time:  06/28/2012 10:42 AM  Clinical Social Work is seeking post-discharge placement for this patient at the following level of care:   SKILLED NURSING   (*CSW will update this form in Epic as items are completed)     Patient/family provided with [facility] Department of Clinical Social Work's list of facilities offering this level of care within the geographic area requested by the patient (or if unable, by the patient's family).  06/28/2012  Patient/family informed of their freedom to choose among providers that offer the needed level of care, that participate in Medicare, Medicaid or managed care program needed by the patient, have an available bed and are willing to accept the patient.    Patient/family informed of [facility]' ownership interest in [facility], as well as of the fact that they are under no obligation to receive care at this facility.  PASARR submitted to EDS on 06/28/2012 PASARR number received from EDS on 06/28/2012  FL2 transmitted to all facilities in geographic area requested by pt/family on  06/28/2012 FL2 transmitted to all facilities within larger geographic area on   Patient informed that his/her managed care company has contracts with or will negotiate with  certain facilities, including the following:     Patient/family informed of bed offers received:  06/28/2012 Patient chooses bed at [facility] Physician recommends and patient chooses bed at    Patient to be transferred to [facility] on   Patient to be transferred to facility by   The following physician request were entered in Epic:   Additional Comments:  [name] LCSW [phone]

## 2012-06-28 NOTE — Progress Notes (Signed)
Clinical Social Work Department BRIEF PSYCHOSOCIAL ASSESSMENT 06/28/2012  Patient:  [name] [name],[name] [name]     Account Number:  [id number]     Admit date:  06/27/2012  Clinical Social Worker:  Candie Chroman  Date/Time:  06/28/2012 10:30 AM  Referred by:  Physician  Date Referred:  06/28/2012 Referred for  SNF Placement   Other Referral:   Interview type:  Patient Other interview type:    PSYCHOSOCIAL DATA Living Status:  ALONE Admitted from facility:   Level of care:   Primary support name:  [name] [name] Primary support relationship to patient:  CHILD, ADULT Degree of support available:   supportive    CURRENT CONCERNS Current Concerns  Post-Acute Placement   Other Concerns:    SOCIAL WORK ASSESSMENT / PLAN Pt is an 79 yr old female living at home prior to hospitalization. CSW met with pt / family to assist with d/c planning. Pt has made prior arrangements to have ST Rehab at [facility] following hospital d/c. CSW has contacted SNF and d/c plans have been confirmed. CSW will follow to assist with d/c planning to SNF and will begin authorization process for [facility]   Assessment/plan status:  Psychosocial Support/Ongoing Assessment of Needs Other assessment/ plan:   Information/referral to community resources:   None needed at this time.    PATIENT'S/FAMILY'S RESPONSE TO PLAN OF CARE: Pt is looking forward to having rehab at [facility].    [name] LCSW [phone]

## 2012-06-28 NOTE — Progress Notes (Signed)
Physical Therapy Treatment Patient Details Name: [name] [name] MRN: [id number] DOB: February 27, 1925 Today's Date: 06/28/2012 Time: 3295-1884 PT Time Calculation (min): 20 min  PT Assessment / Plan / Recommendation Comments on Treatment Session  Pt too fatigued to get OOB this afternoon, performed ther ex. Doing well.     Follow Up Recommendations  SNF     Does the patient have the potential to tolerate intense rehabilitation     Barriers to Discharge        Equipment Recommendations  Rolling walker with 5" wheels    Recommendations for Other Services OT consult  Frequency 7X/week   Plan Discharge plan remains appropriate;Frequency remains appropriate    Precautions / Restrictions Precautions Precautions: Knee Restrictions Weight Bearing Restrictions: No Other Position/Activity Restrictions: WBAT   Pertinent Vitals/Pain *3/10 L knee Premedicated, ice applied**    Mobility       Exercises Total Joint Exercises Ankle Circles/Pumps: AROM;Both;10 reps;Supine Quad Sets: AROM;Both;5 reps;Seated Towel Squeeze: AROM;Both;10 reps;Supine Short Arc Quad: AAROM;Left;15 reps;Supine Heel Slides: AAROM;Left;10 reps;Supine Hip ABduction/ADduction: AAROM;Left;10 reps;Supine   PT Diagnosis:    PT Problem List:   PT Treatment Interventions:     PT Goals Acute Rehab PT Goals PT Goal Formulation: With patient/family Time For Goal Achievement: 07/12/12 Potential to Achieve Goals: Good Pt will go Supine/Side to Sit: with modified independence Pt will go Sit to Stand: with supervision Pt will Ambulate: 51 - 150 feet;with supervision;with rolling walker Pt will Perform Home Exercise Program: with min assist PT Goal: Perform Home Exercise Program - Progress: Progressing toward goal  Visit Information  Last PT Received On: 06/28/12    Subjective Data  Subjective: My knee is feeling a little better.  Patient Stated Goal: return to water aerobics   Cognition  Cognition Overall  Cognitive Status: Appears within functional limits for tasks assessed/performed Arousal/Alertness: Awake/alert Orientation Level: Appears intact for tasks assessed Behavior During Session: [facility] for tasks performed    Balance     End of Session PT - End of Session Activity Tolerance: Patient tolerated treatment well Patient left: with call bell/phone within reach;with family/visitor present;in bed Nurse Communication: Mobility status   GP     Ralene Bathe Kistler 06/28/2012, 2:09 PM [phone]

## 2012-06-28 NOTE — Care Management Note (Signed)
    Page 1 of 1   06/28/2012     5:52:00 PM   CARE MANAGEMENT NOTE 06/28/2012  Patient:  [name],[name]   Account Number:  [id number]  Date Initiated:  06/28/2012  Documentation initiated by:  Colleen Can  Subjective/Objective Assessment:   dx total right knee replacemnt     Action/Plan:   SNF rehab   Anticipated DC Date:  06/30/2012   Anticipated DC Plan:  SKILLED NURSING FACILITY  In-house referral  Clinical Social Worker      DC Planning Services  CM consult      Choice offered to / List presented to:             Status of service:  Completed, signed off Medicare Important Message given?  NA - LOS <3 / Initial given by admissions (If response is "NO", the following Medicare IM given date fields will be blank) Date Medicare IM given:   Date Additional Medicare IM given:    Discharge Disposition:    Per UR Regulation:  Reviewed for med. necessity/level of care/duration of stay  If discussed at Long Length of Stay Meetings, dates discussed:    Comments:

## 2012-06-29 LAB — BASIC METABOLIC PANEL
BUN: 12 mg/dL (ref 6–23)
CO2: 31 mEq/L (ref 19–32)
Chloride: 101 mEq/L (ref 96–112)
Glucose, Bld: 183 mg/dL — ABNORMAL HIGH (ref 70–99)
Potassium: 3.9 mEq/L (ref 3.5–5.1)
Sodium: 137 mEq/L (ref 135–145)

## 2012-06-29 LAB — CBC
HCT: 34.6 % — ABNORMAL LOW (ref 36.0–46.0)
Hemoglobin: 11.3 g/dL — ABNORMAL LOW (ref 12.0–15.0)
MCHC: 32.7 g/dL (ref 30.0–36.0)
RBC: 3.72 MIL/uL — ABNORMAL LOW (ref 3.87–5.11)
WBC: 11.3 10*3/uL — ABNORMAL HIGH (ref 4.0–10.5)

## 2012-06-29 NOTE — Progress Notes (Signed)
Physical Therapy Treatment Patient Details Name: [name] [name] MRN: [id number] DOB: January 27, 1925 Today's Date: 06/29/2012 Time: 1135-1201 PT Time Calculation (min): 26 min  PT Assessment / Plan / Recommendation Comments on Treatment Session       Follow Up Recommendations  SNF     Does the patient have the potential to tolerate intense rehabilitation     Barriers to Discharge        Equipment Recommendations  Rolling walker with 5" wheels    Recommendations for Other Services OT consult  Frequency 7X/week   Plan Discharge plan remains appropriate;Frequency remains appropriate    Precautions / Restrictions Precautions Precautions: Knee Required Braces or Orthoses: Knee Immobilizer - Left Restrictions Weight Bearing Restrictions: No Other Position/Activity Restrictions: WBAT   Pertinent Vitals/Pain     Mobility  Bed Mobility Bed Mobility: Supine to Sit Supine to Sit: 4: Min assist Details for Bed Mobility Assistance: min A to elevate trunk, and support LLE Transfers Transfers: Sit to Stand;Stand to Sit Sit to Stand: 4: Min assist;With upper extremity assist;From bed Stand to Sit: 4: Min assist;With upper extremity assist;To chair/3-in-1 Details for Transfer Assistance: min verbal cues for hand placement and L LE out in front. Ambulation/Gait Ambulation/Gait Assistance: 4: Min assist Ambulation Distance (Feet): 100 Feet Assistive device: Rolling walker Ambulation/Gait Assistance Details: cues for sequence, posture, position from RW  Gait Pattern: Step-to pattern;Decreased step length - right;Decreased step length - left;Antalgic    Exercises Total Joint Exercises Ankle Circles/Pumps: AROM;Both;Supine;20 reps Quad Sets: AROM;Both;15 reps;Supine Heel Slides: AAROM;Left;Supine;20 reps Straight Leg Raises: AAROM;20 reps;Left;Supine   PT Diagnosis:    PT Problem List:   PT Treatment Interventions:     PT Goals Acute Rehab PT Goals PT Goal Formulation: With  patient/family Time For Goal Achievement: 07/12/12 Potential to Achieve Goals: Good Pt will go Supine/Side to Sit: with modified independence PT Goal: Supine/Side to Sit - Progress: Progressing toward goal Pt will go Sit to Stand: with supervision PT Goal: Sit to Stand - Progress: Progressing toward goal Pt will Ambulate: 51 - 150 feet;with supervision;with rolling walker PT Goal: Ambulate - Progress: Progressing toward goal Pt will Perform Home Exercise Program: with min assist PT Goal: Perform Home Exercise Program - Progress: Progressing toward goal  Visit Information  Last PT Received On: 06/29/12 Assistance Needed: +1    Subjective Data  Subjective: I think I did good today Patient Stated Goal: return to water aerobics   Cognition  Cognition Overall Cognitive Status: Appears within functional limits for tasks assessed/performed Arousal/Alertness: Awake/alert Orientation Level: Appears intact for tasks assessed Behavior During Session: [facility] for tasks performed    Balance     End of Session PT - End of Session Equipment Utilized During Treatment: Gait belt Activity Tolerance: Patient tolerated treatment well Patient left: with call bell/phone within reach;with family/visitor present;in chair Nurse Communication: Mobility status   GP     [name] [name] 06/29/2012, 12:50 PM

## 2012-06-29 NOTE — Progress Notes (Signed)
   Subjective: 2 Days Post-Op Procedure(s) (LRB): LEFT TOTAL KNEE ARTHROPLASTY (Left) Patient reports pain as mild and moderate.   Patient seen in rounds for Dr. Lequita Halt.  Family in room at bedside. Patient is well, but has had some minor complaints of pain in the knee, requiring pain medications Plan is to go Skilled nursing facility after hospital stay.  Looking into [facility].  Objective: Vital signs in last 24 hours: Temp:  [97.5 F (36.4 C)-99 F (37.2 C)] 98.3 F (36.8 C) (04/09 1025) Pulse Rate:  [81-87] 87 (04/09 0440) Resp:  [14-16] 16 (04/09 0440) BP: (129-190)/(69-81) 129/70 mmHg (04/09 0816) SpO2:  [93 %-97 %] 97 % (04/09 0440)  Intake/Output from previous day:  Intake/Output Summary (Last 24 hours) at 06/29/12 1416 Last data filed at 06/29/12 1130  Gross per 24 hour  Intake    480 ml  Output   1800 ml  Net  -1320 ml    Intake/Output this shift: Total I/O In: 480 [P.O.:480] Out: 250 [Urine:250]  Labs:  Recent Labs  06/28/12 0500 06/29/12 0439  HGB 11.6* 11.3*    Recent Labs  06/28/12 0500 06/29/12 0439  WBC 8.1 11.3*  RBC 3.72* 3.72*  HCT 34.3* 34.6*  PLT 167 178    Recent Labs  06/28/12 0500 06/29/12 0439  NA 128* 137  K 3.8 3.9  CL 94* 101  CO2 31 31  BUN 10 12  CREATININE 0.51 0.49*  GLUCOSE 139* 183*  CALCIUM 8.1* 9.3   No results found for this basename: LABPT, INR,  in the last 72 hours  EXAM General - Patient is Alert, Appropriate and Oriented Extremity - Neurovascular intact Sensation intact distally Dorsiflexion/Plantar flexion intact No cellulitis present Dressing/Incision - clean, dry, no drainage, healing Motor Function - intact, moving foot and toes well on exam.   Past Medical History  Diagnosis Date  . Arthritis   . Blind left eye   . Dysrhythmia   . Varicose veins     both legs and left fooot at arch  . Complication of anesthesia     sensitive to meds, bp can fall    Assessment/Plan: 2 Days Post-Op  Procedure(s) (LRB): LEFT TOTAL KNEE ARTHROPLASTY (Left) Principal Problem:   OA (osteoarthritis) of knee Active Problems:   Postop Hyponatremia  Estimated body mass index is 32 kg/(m^2) as calculated from the following:   Height as of this encounter: 5\' 2"  (1.575 m).   Weight as of this encounter: 79.379 kg (175 lb). Up with therapy Plan for discharge tomorrow Discharge to SNF  DVT Prophylaxis - Xarelto, Aspirin 81 mg on hold Weight-Bearing as tolerated to left leg Sodium improved.   PERKINS, ALEXZANDREW 06/29/2012, 2:16 PM

## 2012-06-29 NOTE — Evaluation (Signed)
Occupational Therapy Evaluation Patient Details Name: [name] [name] MRN: [id number] DOB: 03-26-1924 Today's Date: 06/29/2012 Time: 1140-1210 OT Time Calculation (min): 30 min  OT Assessment / Plan / Recommendation Clinical Impression  Pt is a 79 year old female s/p L TKA and displays decreased strength, ROM and will benefit from skilled OT services to improve ADL independence for next venue of care.     OT Assessment  Patient needs continued OT Services    Follow Up Recommendations  SNF;Supervision/Assistance - 24 hour    Barriers to Discharge      Equipment Recommendations       Recommendations for Other Services    Frequency  Min 2X/week    Precautions / Restrictions Precautions Precautions: Knee Required Braces or Orthoses: Knee Immobilizer - Left Restrictions Weight Bearing Restrictions: No Other Position/Activity Restrictions: WBAT        ADL  Eating/Feeding: Independent Where Assessed - Eating/Feeding: Chair Grooming: Wash/dry hands Where Assessed - Grooming: Supported sitting Upper Body Bathing: Chest;Right arm;Left arm;Abdomen;Set up Where Assessed - Upper Body Bathing: Unsupported sitting Lower Body Bathing: Minimal assistance Where Assessed - Lower Body Bathing: Supported sit to stand Upper Body Dressing: Set up Where Assessed - Upper Body Dressing: Unsupported sitting Lower Body Dressing: Moderate assistance Where Assessed - Lower Body Dressing: Supported sit to Pharmacist, hospital: Minimal assistance Toilet Transfer Method: Sit to stand (in hallway to ambulate with PT) Toileting - Clothing Manipulation and Hygiene: Minimal assistance Where Assessed - Glass blower/designer Manipulation and Hygiene: Standing Equipment Used: Rolling walker ADL Comments: Educated on AE options but pt able to reach almost all the way to her L toes so anticipate she will be able to soon don clothing/socks over L LE without difficulty. Pt doing well and is excited about her  activity level today. She does need cues for safety to look up and not maintain head down looking at the floor while mobilizing. She also needs cues for hand placement with functional transfers.     OT Diagnosis: Generalized weakness  OT Problem List: Decreased strength;Decreased range of motion;Decreased knowledge of use of DME or AE OT Treatment Interventions: Self-care/ADL training;DME and/or AE instruction;Therapeutic activities   OT Goals Acute Rehab OT Goals OT Goal Formulation: With patient Time For Goal Achievement: 07/06/12 Potential to Achieve Goals: Good ADL Goals Pt Will Perform Grooming: with supervision;Standing at sink ADL Goal: Grooming - Progress: Goal set today Pt Will Perform Lower Body Bathing: with supervision;Sit to stand from chair;Sit to stand from bed ADL Goal: Lower Body Bathing - Progress: Goal set today Pt Will Perform Lower Body Dressing: with supervision;Sit to stand from chair;Sit to stand from bed ADL Goal: Lower Body Dressing - Progress: Goal set today Pt Will Transfer to Toilet: with supervision;with DME;Ambulation;3-in-1 ADL Goal: Toilet Transfer - Progress: Goal set today Pt Will Perform Toileting - Clothing Manipulation: with supervision;Standing ADL Goal: Toileting - Clothing Manipulation - Progress: Goal set today  Visit Information  Last OT Received On: 06/29/12 Assistance Needed: +1 PT/OT Co-Evaluation/Treatment: Yes    Subjective Data  Subjective: I think I am doing better today Patient Stated Goal: wants to be able to garden again   Prior Functioning               Vision/Perception Vision - History Baseline Vision: Other (comment) (blind L eye)   Cognition  Cognition Overall Cognitive Status: Appears within functional limits for tasks assessed/performed Arousal/Alertness: Awake/alert Behavior During Session: [facility] for tasks performed    Extremity/Trunk Assessment Right  Upper Extremity Assessment RUE ROM/Strength/Tone: [facility]  for tasks assessed Left Upper Extremity Assessment LUE ROM/Strength/Tone: WFL for tasks assessed     Mobility Bed Mobility Bed Mobility: Supine to Sit Supine to Sit: 4: Min assist Transfers Transfers: Sit to Stand;Stand to Sit Sit to Stand: 4: Min assist;With upper extremity assist;From bed Stand to Sit: 4: Min assist;With upper extremity assist;To chair/3-in-1 Details for Transfer Assistance: min verbal cues for hand placement and L LE out in front.     Exercise     Balance     End of Session OT - End of Session Equipment Utilized During Treatment: Gait belt Activity Tolerance: Patient tolerated treatment well Patient left: in chair;with call bell/phone within reach;with family/visitor present  GO     Lennox Laity [phone] 06/29/2012, 12:43 PM

## 2012-06-29 NOTE — Progress Notes (Signed)
Physical Therapy Treatment Patient Details Name: [name] [name] MRN: [id number] DOB: 1924/06/13 Today's Date: 06/29/2012 Time: 5784-6962 PT Time Calculation (min): 25 min  PT Assessment / Plan / Recommendation Comments on Treatment Session       Follow Up Recommendations  SNF     Does the patient have the potential to tolerate intense rehabilitation     Barriers to Discharge        Equipment Recommendations  Rolling walker with 5" wheels    Recommendations for Other Services OT consult  Frequency 7X/week   Plan Discharge plan remains appropriate;Frequency remains appropriate    Precautions / Restrictions Precautions Precautions: Knee Required Braces or Orthoses: Knee Immobilizer - Left Knee Immobilizer - Left: Discontinue once straight leg raise with < 10 degree lag Restrictions Weight Bearing Restrictions: No Other Position/Activity Restrictions: WBAT   Pertinent Vitals/Pain     Mobility  Bed Mobility Bed Mobility: Supine to Sit;Sit to Supine Supine to Sit: 4: Min assist Sit to Supine: 4: Min assist Details for Bed Mobility Assistance: cues for sequence Transfers Transfers: Sit to Stand;Stand to Sit Sit to Stand: 4: Min assist;With upper extremity assist;From bed Stand to Sit: 4: Min assist;With upper extremity assist;To bed;With armrests Details for Transfer Assistance: min verbal cues for hand placement and L LE out in front. Ambulation/Gait Ambulation/Gait Assistance: 4: Min assist Ambulation Distance (Feet): 100 Feet (twice) Assistive device: Rolling walker Ambulation/Gait Assistance Details: cues for sequence, posture and position from RW Gait Pattern: Step-to pattern;Decreased step length - right;Decreased step length - left;Antalgic    Exercises     PT Diagnosis:    PT Problem List:   PT Treatment Interventions:     PT Goals Acute Rehab PT Goals PT Goal Formulation: With patient/family Time For Goal Achievement: 07/12/12 Potential to Achieve Goals:  Good Pt will go Supine/Side to Sit: with modified independence PT Goal: Supine/Side to Sit - Progress: Progressing toward goal Pt will go Sit to Stand: with supervision PT Goal: Sit to Stand - Progress: Progressing toward goal Pt will Ambulate: 51 - 150 feet;with supervision;with rolling walker PT Goal: Ambulate - Progress: Progressing toward goal Pt will Perform Home Exercise Program: with min assist PT Goal: Perform Home Exercise Program - Progress: Progressing toward goal  Visit Information  Last PT Received On: 06/29/12 Assistance Needed: +1    Subjective Data  Subjective: It hurt when I first got up but its better now Patient Stated Goal: return to water aerobics   Cognition  Cognition Overall Cognitive Status: Appears within functional limits for tasks assessed/performed Arousal/Alertness: Awake/alert Orientation Level: Appears intact for tasks assessed Behavior During Session: [facility] for tasks performed    Balance     End of Session PT - End of Session Equipment Utilized During Treatment: Gait belt Activity Tolerance: Patient tolerated treatment well Patient left: in bed;with call bell/phone within reach;with family/visitor present Nurse Communication: Mobility status CPM Left Knee CPM Left Knee: On   GP     [name] [name] 06/29/2012, 5:27 PM

## 2012-06-30 LAB — CBC
HCT: 30.5 % — ABNORMAL LOW (ref 36.0–46.0)
Hemoglobin: 10.3 g/dL — ABNORMAL LOW (ref 12.0–15.0)
MCH: 31.7 pg (ref 26.0–34.0)
MCHC: 33.8 g/dL (ref 30.0–36.0)
MCV: 93.8 fL (ref 78.0–100.0)
Platelets: 176 K/uL (ref 150–400)
RBC: 3.25 MIL/uL — ABNORMAL LOW (ref 3.87–5.11)
RDW: 14 % (ref 11.5–15.5)
WBC: 9.4 K/uL (ref 4.0–10.5)

## 2012-06-30 MED ORDER — HYDROMORPHONE HCL 2 MG PO TABS: 2.0000 mg | ORAL_TABLET | ORAL | Status: DC | PRN

## 2012-06-30 MED ORDER — BISACODYL 10 MG RE SUPP: 10.0000 mg | Freq: Every day | RECTAL | Status: DC | PRN

## 2012-06-30 MED ORDER — DIPHENHYDRAMINE HCL 12.5 MG/5ML PO ELIX: 12.5000 mg | ORAL_SOLUTION | ORAL | Status: DC | PRN

## 2012-06-30 MED ORDER — POLYETHYLENE GLYCOL 3350 17 G PO PACK: 17.0000 g | PACK | Freq: Every day | ORAL | Status: DC | PRN

## 2012-06-30 MED ORDER — ACETAMINOPHEN 325 MG PO TABS: 650.0000 mg | ORAL_TABLET | Freq: Four times a day (QID) | ORAL | Status: DC | PRN

## 2012-06-30 MED ORDER — METHOCARBAMOL 500 MG PO TABS: 500.0000 mg | ORAL_TABLET | Freq: Four times a day (QID) | ORAL | Status: DC | PRN

## 2012-06-30 MED ORDER — DSS 100 MG PO CAPS: 100.0000 mg | ORAL_CAPSULE | Freq: Two times a day (BID) | ORAL | Status: DC

## 2012-06-30 MED ORDER — TRAMADOL HCL 50 MG PO TABS: 50.0000 mg | ORAL_TABLET | Freq: Four times a day (QID) | ORAL | Status: DC | PRN

## 2012-06-30 MED ORDER — RIVAROXABAN 10 MG PO TABS: 10.0000 mg | ORAL_TABLET | Freq: Every day | ORAL | Status: DC

## 2012-06-30 MED ORDER — ONDANSETRON HCL 4 MG PO TABS: 4.0000 mg | ORAL_TABLET | Freq: Four times a day (QID) | ORAL | Status: DC | PRN

## 2012-06-30 NOTE — Progress Notes (Signed)
Clinical Social Work Department CLINICAL SOCIAL WORK PLACEMENT NOTE 06/30/2012  Patient:  [name] [name], [name] [name]  Account Number:  [id number] Admit date:  06/27/2012  Clinical Social Worker:  [name], LCSW  Date/time:  06/28/2012 10:42 AM  Clinical Social Work is seeking post-discharge placement for this patient at the following level of care:   SKILLED NURSING   (*CSW will update this form in Epic as items are completed)     Patient/family provided with [facility] Department of Clinical Social Work's list of facilities offering this level of care within the geographic area requested by the patient (or if unable, by the patient's family).  06/28/2012  Patient/family informed of their freedom to choose among providers that offer the needed level of care, that participate in Medicare, Medicaid or managed care program needed by the patient, have an available bed and are willing to accept the patient.    Patient/family informed of [facility]' ownership interest in [facility], as well as of the fact that they are under no obligation to receive care at this facility.  PASARR submitted to EDS on 06/28/2012 PASARR number received from [employer] on 06/28/2012  FL2 transmitted to all facilities in geographic area requested by pt/family on  06/28/2012 FL2 transmitted to all facilities within larger geographic area on   Patient informed that his/her managed care company has contracts with or will negotiate with  certain facilities, including the following:     Patient/family informed of bed offers received:  06/28/2012 Patient chooses bed at [facility] Physician recommends and patient chooses bed at    Patient to be transferred to [facility] on  06/30/2012 Patient to be transferred to facility by [facility]  The following physician request were entered in Epic:   Additional Comments:   [employer] provided authorization for SNF and AMB transport.  [name] LCSW  [phone]

## 2012-06-30 NOTE — Progress Notes (Signed)
Report called to Lottie Mussel, Charity fundraiser at [facility].  Pt being transported via [facility] all paperwork sent with [facility] and belongings sent with daughter.

## 2012-06-30 NOTE — Discharge Summary (Signed)
Physician Discharge Summary   Patient ID: [name] [name] MRN: [id number] DOB/AGE: Oct 17, 1924 79 y.o.  Admit date: 06/27/2012 Discharge date: 06/30/2012  Primary Diagnosis:  Osteoarthritis Left knee  Admission Diagnoses:  Past Medical History  Diagnosis Date  . Arthritis   . Blind left eye   . Dysrhythmia   . Varicose veins     both legs and left fooot at arch  . Complication of anesthesia     sensitive to meds, bp can fall   Discharge Diagnoses:   Principal Problem:   OA (osteoarthritis) of knee Active Problems:   Postop Hyponatremia  Estimated body mass index is 32 kg/(m^2) as calculated from the following:   Height as of this encounter: 5\' 2"  (1.575 m).   Weight as of this encounter: 79.379 kg (175 lb).  Procedure:  Procedure(s) (LRB): LEFT TOTAL KNEE ARTHROPLASTY (Left)   Consults: None  HPI: [name] [name] is a 79 y.o. year old female with end stage OA of her left knee with progressively worsening pain and dysfunction. She has constant pain, with activity and at rest and significant functional deficits with difficulties even with ADLs. She has had extensive non-op management including analgesics, injections of cortisone, and home exercise program, but remains in significant pain with significant dysfunction. Radiographs show bone on bone arthritis medial and aptellofemoral with varus deformity. She presents now for left Total Knee Arthroplasty.   Laboratory Data: Admission on 06/27/2012  Component Date Value Range Status  . ABO/RH(D) 06/27/2012 A POS   Final  . Antibody Screen 06/27/2012 NEG   Final  . Sample Expiration 06/27/2012 06/30/2012   Final  . ABO/RH(D) 06/27/2012 A POS   Final  . WBC 06/28/2012 8.1  4.0 - 10.5 K/uL Final  . RBC 06/28/2012 3.72* 3.87 - 5.11 MIL/uL Final  . Hemoglobin 06/28/2012 11.6* 12.0 - 15.0 g/dL Final  . HCT 44/03/270 34.3* 36.0 - 46.0 % Final  . MCV 06/28/2012 92.2  78.0 - 100.0 fL Final  . MCH 06/28/2012 31.2  26.0 - 34.0 pg Final  .  MCHC 06/28/2012 33.8  30.0 - 36.0 g/dL Final  . RDW 53/66/4403 13.3  11.5 - 15.5 % Final  . Platelets 06/28/2012 167  150 - 400 K/uL Final  . Sodium 06/28/2012 128* 135 - 145 mEq/L Final  . Potassium 06/28/2012 3.8  3.5 - 5.1 mEq/L Final  . Chloride 06/28/2012 94* 96 - 112 mEq/L Final  . CO2 06/28/2012 31  19 - 32 mEq/L Final  . Glucose, Bld 06/28/2012 139* 70 - 99 mg/dL Final  . BUN 47/42/5956 10  6 - 23 mg/dL Final  . Creatinine, Ser 06/28/2012 0.51  0.50 - 1.10 mg/dL Final  . Calcium 38/75/6433 8.1* 8.4 - 10.5 mg/dL Final  . GFR calc non Af Amer 06/28/2012 85* >90 mL/min Final  . GFR calc Af Amer 06/28/2012 >90  >90 mL/min Final   Comment:                                 The eGFR has been calculated                          using the CKD EPI equation.                          This calculation has not been  validated in all clinical                          situations.                          eGFR's persistently                          <90 mL/min signify                          possible Chronic Kidney Disease.  . WBC 06/29/2012 11.3* 4.0 - 10.5 K/uL Final  . RBC 06/29/2012 3.72* 3.87 - 5.11 MIL/uL Final  . Hemoglobin 06/29/2012 11.3* 12.0 - 15.0 g/dL Final  . HCT 47/82/9562 34.6* 36.0 - 46.0 % Final  . MCV 06/29/2012 93.0  78.0 - 100.0 fL Final  . MCH 06/29/2012 30.4  26.0 - 34.0 pg Final  . MCHC 06/29/2012 32.7  30.0 - 36.0 g/dL Final  . RDW 13/10/6576 13.4  11.5 - 15.5 % Final  . Platelets 06/29/2012 178  150 - 400 K/uL Final  . Sodium 06/29/2012 137  135 - 145 mEq/L Final   DELTA CHECK NOTED  . Potassium 06/29/2012 3.9  3.5 - 5.1 mEq/L Final  . Chloride 06/29/2012 101  96 - 112 mEq/L Final  . CO2 06/29/2012 31  19 - 32 mEq/L Final  . Glucose, Bld 06/29/2012 183* 70 - 99 mg/dL Final  . BUN 46/96/2952 12  6 - 23 mg/dL Final  . Creatinine, Ser 06/29/2012 0.49* 0.50 - 1.10 mg/dL Final  . Calcium 84/13/2440 9.3  8.4 - 10.5 mg/dL Final  . GFR calc non  Af Amer 06/29/2012 86* >90 mL/min Final  . GFR calc Af Amer 06/29/2012 >90  >90 mL/min Final   Comment:                                 The eGFR has been calculated                          using the CKD EPI equation.                          This calculation has not been                          validated in all clinical                          situations.                          eGFR's persistently                          <90 mL/min signify                          possible Chronic Kidney Disease.  . WBC 06/30/2012 9.4  4.0 - 10.5 K/uL Final  . RBC 06/30/2012 3.25* 3.87 - 5.11 MIL/uL Final  . Hemoglobin 06/30/2012 10.3* 12.0 - 15.0 g/dL Final  . HCT  06/30/2012 30.5* 36.0 - 46.0 % Final  . MCV 06/30/2012 93.8  78.0 - 100.0 fL Final  . MCH 06/30/2012 31.7  26.0 - 34.0 pg Final  . MCHC 06/30/2012 33.8  30.0 - 36.0 g/dL Final  . RDW 81/19/1478 14.0  11.5 - 15.5 % Final  . Platelets 06/30/2012 176  150 - 400 K/uL Final  Hospital Outpatient Visit on 06/21/2012  Component Date Value Range Status  . aPTT 06/21/2012 32  24 - 37 seconds Final  . WBC 06/21/2012 5.9  4.0 - 10.5 K/uL Final  . RBC 06/21/2012 4.59  3.87 - 5.11 MIL/uL Final  . Hemoglobin 06/21/2012 14.2  12.0 - 15.0 g/dL Final  . HCT 29/56/2130 43.5  36.0 - 46.0 % Final  . MCV 06/21/2012 94.8  78.0 - 100.0 fL Final  . MCH 06/21/2012 30.9  26.0 - 34.0 pg Final  . MCHC 06/21/2012 32.6  30.0 - 36.0 g/dL Final  . RDW 86/57/8469 13.4  11.5 - 15.5 % Final  . Platelets 06/21/2012 227  150 - 400 K/uL Final  . Sodium 06/21/2012 139  135 - 145 mEq/L Final  . Potassium 06/21/2012 4.2  3.5 - 5.1 mEq/L Final  . Chloride 06/21/2012 101  96 - 112 mEq/L Final  . CO2 06/21/2012 33* 19 - 32 mEq/L Final  . Glucose, Bld 06/21/2012 61* 70 - 99 mg/dL Final  . BUN 62/95/2841 18  6 - 23 mg/dL Final  . Creatinine, Ser 06/21/2012 0.71  0.50 - 1.10 mg/dL Final  . Calcium 32/44/0102 9.2  8.4 - 10.5 mg/dL Final  . Total Protein 06/21/2012 6.5  6.0 -  8.3 g/dL Final  . Albumin 72/53/6644 3.4* 3.5 - 5.2 g/dL Final  . AST 03/47/4259 25  0 - 37 U/L Final  . ALT 06/21/2012 19  0 - 35 U/L Final  . Alkaline Phosphatase 06/21/2012 106  39 - 117 U/L Final  . Total Bilirubin 06/21/2012 0.3  0.3 - 1.2 mg/dL Final  . GFR calc non Af Amer 06/21/2012 76* >90 mL/min Final  . GFR calc Af Amer 06/21/2012 89* >90 mL/min Final   Comment:                                 The eGFR has been calculated                          using the CKD EPI equation.                          This calculation has not been                          validated in all clinical                          situations.                          eGFR's persistently                          <90 mL/min signify                          possible  Chronic Kidney Disease.  Marland Kitchen Prothrombin Time 06/21/2012 13.2  11.6 - 15.2 seconds Final  . INR 06/21/2012 1.01  0.00 - 1.49 Final  . Color, Urine 06/21/2012 YELLOW  YELLOW Final  . APPearance 06/21/2012 CLEAR  CLEAR Final  . Specific Gravity, Urine 06/21/2012 1.015  1.005 - 1.030 Final  . pH 06/21/2012 6.0  5.0 - 8.0 Final  . Glucose, UA 06/21/2012 NEGATIVE  NEGATIVE mg/dL Final  . Hgb urine dipstick 06/21/2012 NEGATIVE  NEGATIVE Final  . Bilirubin Urine 06/21/2012 NEGATIVE  NEGATIVE Final  . Ketones, ur 06/21/2012 NEGATIVE  NEGATIVE mg/dL Final  . Protein, ur 16/12/9602 NEGATIVE  NEGATIVE mg/dL Final  . Urobilinogen, UA 06/21/2012 0.2  0.0 - 1.0 mg/dL Final  . Nitrite 54/11/8117 NEGATIVE  NEGATIVE Final  . Leukocytes, UA 06/21/2012 TRACE* NEGATIVE Final  . MRSA, PCR 06/21/2012 NEGATIVE  NEGATIVE Final  . Staphylococcus aureus 06/21/2012 NEGATIVE  NEGATIVE Final   Comment:                                 The Xpert SA Assay (FDA                          approved for NASAL specimens                          in patients over 13 years of age),                          is one component of                          a comprehensive surveillance                            program.  Test performance has                          been validated by [employer] for patients greater                          than or equal to 17 year old.                          It is not intended                          to diagnose infection nor to                          guide or monitor treatment.  . Squamous Epithelial / LPF 06/21/2012 RARE  RARE Final  . WBC, UA 06/21/2012 0-2  <3 WBC/hpf Final  . Bacteria, UA 06/21/2012 FEW* RARE Final     X-Rays:No results found.  EKG: Orders placed in visit on 06/10/12  . EKG 12-LEAD     Hospital Course: Kearie Mennen is a 79 y.o. who was admitted to [facility]. They were brought to the operating  room on 06/27/2012 and underwent Procedure(s): LEFT TOTAL KNEE ARTHROPLASTY.  Patient tolerated the procedure well and was later transferred to the recovery room and then to the orthopaedic floor for postoperative care.  They were given PO and IV analgesics for pain control following their surgery.  They were given 24 hours of postoperative antibiotics of  Anti-infectives   Start     Dose/Rate Route Frequency Ordered Stop   06/27/12 1800  ceFAZolin (ANCEF) IVPB 1 g/50 mL premix     1 g 100 mL/hr over 30 Minutes Intravenous Every 6 hours 06/27/12 1515 06/28/12 0053   06/27/12 0912  ceFAZolin (ANCEF) IVPB 2 g/50 mL premix    Comments:  Changed to Ancef 2 Gm per protocol. The patient weighs < 120kg (79kg documented)   2 g 100 mL/hr over 30 Minutes Intravenous 60 min pre-op 06/27/12 0912 06/27/12 1130     and started on DVT prophylaxis in the form of Xarelto.   PT and OT were ordered for total joint protocol.  Discharge planning consulted to help with postop disposition and equipment needs.  She wanted to look into [facility].  Patient had a decent night on the evening of surgery.  They started to get up OOB with therapy on day one. Hemovac drain was pulled without difficulty.   Continued to work with therapy into day two.  Dressing was changed on day two and the incision was healing well.  By day three, the patient had progressed with therapy and meeting their goals.  Incision was healing well.  Patient was seen in rounds and was ready to go to [facility].   Discharge Medications: Prior to Admission medications   Medication Sig Start Date End Date Taking? Authorizing Provider  PARoxetine (PAXIL) 10 MG tablet Take 10 mg by mouth every morning.   Yes Historical Provider, MD  prednisoLONE acetate (PRED FORTE) 1 % ophthalmic suspension Place 1 drop into both eyes 2 (two) times daily.    Yes Historical Provider, MD  sodium chloride (MURO 128) 2 % ophthalmic solution Place 1 drop into both eyes daily as needed (dry eyes).   Yes Historical Provider, MD  acetaminophen (TYLENOL) 325 MG tablet Take 2 tablets (650 mg total) by mouth every 6 (six) hours as needed. 06/30/12   Alexzandrew Perkins, PA-C  bisacodyl (DULCOLAX) 10 MG suppository Place 1 suppository (10 mg total) rectally daily as needed. 06/30/12   Alexzandrew Julien Girt, PA-C  diphenhydrAMINE (BENADRYL) 12.5 MG/5ML elixir Take 5-10 mLs (12.5-25 mg total) by mouth every 4 (four) hours as needed for itching. 06/30/12   Alexzandrew Perkins, PA-C  docusate sodium 100 MG CAPS Take 100 mg by mouth 2 (two) times daily. 06/30/12   Alexzandrew Perkins, PA-C  HYDROmorphone (DILAUDID) 2 MG tablet Take 1-2 tablets (2-4 mg total) by mouth every 4 (four) hours as needed. 06/30/12   Alexzandrew Perkins, PA-C  methocarbamol (ROBAXIN) 500 MG tablet Take 1 tablet (500 mg total) by mouth every 6 (six) hours as needed. 06/30/12   Alexzandrew Perkins, PA-C  metoprolol tartrate (LOPRESSOR) 25 MG tablet Take 25 mg by mouth at bedtime as needed (ONLY TAKES IF HER HEART IS RACING).    Historical Provider, MD  ondansetron (ZOFRAN) 4 MG tablet Take 1 tablet (4 mg total) by mouth every 6 (six) hours as needed for nausea. 06/30/12   Alexzandrew Perkins, PA-C   polyethylene glycol (MIRALAX / GLYCOLAX) packet Take 17 g by mouth daily as needed. 06/30/12   Alexzandrew Julien Girt, PA-C  rivaroxaban (  XARELTO) 10 MG TABS tablet Take 1 tablet (10 mg total) by mouth daily with breakfast. Take Xarelto for two and a half more weeks, then discontinue Xarelto. Once the patient has completed the Xarelto, they may resume the 81 mg Aspirin. 06/30/12   Alexzandrew Perkins, PA-C  traMADol (ULTRAM) 50 MG tablet Take 1-2 tablets (50-100 mg total) by mouth every 6 (six) hours as needed (mild pain). 06/30/12   Alexzandrew Julien Girt, PA-C    Diet: Regular diet Activity:WBAT Follow-up:in 2 weeks Disposition - Skilled nursing facility - [facility] Discharged Condition: good   Discharge Orders   Future Orders Complete By Expires     Call MD / Call 911  As directed     Comments:      If you experience chest pain or shortness of breath, CALL 911 and be transported to the hospital emergency room.  If you develope a fever above 101 F, pus (white drainage) or increased drainage or redness at the wound, or calf pain, call your surgeon's office.    Change dressing  As directed     Comments:      Change dressing daily with sterile 4 x 4 inch gauze dressing and apply TED hose. Do not submerge the incision under water.    Constipation Prevention  As directed     Comments:      Drink plenty of fluids.  Prune juice may be helpful.  You may use a stool softener, such as Colace (over the counter) 100 mg twice a day.  Use MiraLax (over the counter) for constipation as needed.    Diet - low sodium heart healthy  As directed     Discharge instructions  As directed     Comments:      Pick up stool softner and laxative for home. Do not submerge incision under water. May shower. Continue to use ice for pain and swelling from surgery.  Take Xarelto for two and a half more weeks, then discontinue Xarelto. Once the patient has completed the Xarelto, they may resume the 81 mg Aspirin.    Do  not put a pillow under the knee. Place it under the heel.  As directed     Do not sit on low chairs, stoools or toilet seats, as it may be difficult to get up from low surfaces  As directed     Driving restrictions  As directed     Comments:      No driving until released by the physician.    Increase activity slowly as tolerated  As directed     Lifting restrictions  As directed     Comments:      No lifting until released by the physician.    Patient may shower  As directed     Comments:      You may shower without a dressing once there is no drainage.  Do not wash over the wound.  If drainage remains, do not shower until drainage stops.    TED hose  As directed     Comments:      Use stockings (TED hose) for 3 weeks on both leg(s).  You may remove them at night for sleeping.    Weight bearing as tolerated  As directed         Medication List    STOP taking these medications       aspirin 81 MG tablet     calcium-vitamin D 500-200 MG-UNIT per tablet  Commonly  known as:  OSCAL WITH D     CRANBERRY PO     fish oil-omega-3 fatty acids 1000 MG capsule     Glucosamine-Chondroitin-MSM 750-400-375 MG Tabs     GNP GARLIC EXTRACT PO     ICAPS PO     magnesium oxide 400 MG tablet  Commonly known as:  MAG-OX     multivitamin with minerals Tabs     Selenium 200 MCG Caps     vitamin E 400 UNIT capsule      TAKE these medications       acetaminophen 325 MG tablet  Commonly known as:  TYLENOL  Take 2 tablets (650 mg total) by mouth every 6 (six) hours as needed.     bisacodyl 10 MG suppository  Commonly known as:  DULCOLAX  Place 1 suppository (10 mg total) rectally daily as needed.     diphenhydrAMINE 12.5 MG/5ML elixir  Commonly known as:  BENADRYL  Take 5-10 mLs (12.5-25 mg total) by mouth every 4 (four) hours as needed for itching.     DSS 100 MG Caps  Take 100 mg by mouth 2 (two) times daily.     HYDROmorphone 2 MG tablet  Commonly known as:  DILAUDID  Take  1-2 tablets (2-4 mg total) by mouth every 4 (four) hours as needed.     methocarbamol 500 MG tablet  Commonly known as:  ROBAXIN  Take 1 tablet (500 mg total) by mouth every 6 (six) hours as needed.     metoprolol tartrate 25 MG tablet  Commonly known as:  LOPRESSOR  Take 25 mg by mouth at bedtime as needed (ONLY TAKES IF HER HEART IS RACING).     ondansetron 4 MG tablet  Commonly known as:  ZOFRAN  Take 1 tablet (4 mg total) by mouth every 6 (six) hours as needed for nausea.     PARoxetine 10 MG tablet  Commonly known as:  PAXIL  Take 10 mg by mouth every morning.     polyethylene glycol packet  Commonly known as:  MIRALAX / GLYCOLAX  Take 17 g by mouth daily as needed.     prednisoLONE acetate 1 % ophthalmic suspension  Commonly known as:  PRED FORTE  Place 1 drop into both eyes 2 (two) times daily.     rivaroxaban 10 MG Tabs tablet  Commonly known as:  XARELTO  Take 1 tablet (10 mg total) by mouth daily with breakfast. Take Xarelto for two and a half more weeks, then discontinue Xarelto.  Once the patient has completed the Xarelto, they may resume the 81 mg Aspirin.     sodium chloride 2 % ophthalmic solution  Commonly known as:  MURO 128  Place 1 drop into both eyes daily as needed (dry eyes).     traMADol 50 MG tablet  Commonly known as:  ULTRAM  Take 1-2 tablets (50-100 mg total) by mouth every 6 (six) hours as needed (mild pain).           Follow-up Information   Follow up with Loanne Drilling, MD. Schedule an appointment as soon as possible for a visit in 2 weeks.   Contact information:   [address] [place] [address] [phone]       Signed: Patrica Duel 06/30/2012, 8:10 AM

## 2012-06-30 NOTE — Progress Notes (Signed)
   Subjective: 3 Days Post-Op Procedure(s) (LRB): LEFT TOTAL KNEE ARTHROPLASTY (Left) Patient reports pain as mild.   Patient seen in rounds with Dr. Lequita Halt. Patient is well, and has had no acute complaints or problems Patient is ready to go the SNF today.  Objective: Vital signs in last 24 hours: Temp:  [98.1 F (36.7 C)-98.5 F (36.9 C)] 98.1 F (36.7 C) (04/09 2150) Pulse Rate:  [82] 82 (04/09 2150) Resp:  [14-16] 14 (04/09 2150) BP: (129-143)/(69-73) 131/69 mmHg (04/09 2150) SpO2:  [95 %-97 %] 95 % (04/09 2150)  Intake/Output from previous day:  Intake/Output Summary (Last 24 hours) at 06/30/12 0647 Last data filed at 06/30/12 0500  Gross per 24 hour  Intake   1320 ml  Output    950 ml  Net    370 ml    Intake/Output this shift: Total I/O In: 600 [P.O.:600] Out: 500 [Urine:500]  Labs:  Recent Labs  06/28/12 0500 06/29/12 0439 06/30/12 0446  HGB 11.6* 11.3* 10.3*    Recent Labs  06/29/12 0439 06/30/12 0446  WBC 11.3* 9.4  RBC 3.72* 3.25*  HCT 34.6* 30.5*  PLT 178 176    Recent Labs  06/28/12 0500 06/29/12 0439  NA 128* 137  K 3.8 3.9  CL 94* 101  CO2 31 31  BUN 10 12  CREATININE 0.51 0.49*  GLUCOSE 139* 183*  CALCIUM 8.1* 9.3   No results found for this basename: LABPT, INR,  in the last 72 hours  EXAM: General - Patient is Alert, Appropriate and Oriented Extremity - Neurovascular intact Sensation intact distally Dorsiflexion/Plantar flexion intact No cellulitis present Incision - clean, dry, no drainage, healing Motor Function - intact, moving foot and toes well on exam.   Assessment/Plan: 3 Days Post-Op Procedure(s) (LRB): LEFT TOTAL KNEE ARTHROPLASTY (Left) Procedure(s) (LRB): LEFT TOTAL KNEE ARTHROPLASTY (Left) Past Medical History  Diagnosis Date  . Arthritis   . Blind left eye   . Dysrhythmia   . Varicose veins     both legs and left fooot at arch  . Complication of anesthesia     sensitive to meds, bp can fall    Principal Problem:   OA (osteoarthritis) of knee Active Problems:   Postop Hyponatremia  Estimated body mass index is 32 kg/(m^2) as calculated from the following:   Height as of this encounter: 5\' 2"  (1.575 m).   Weight as of this encounter: 79.379 kg (175 lb). Discharge to SNF Diet - Regular diet Follow up - in 2 weeks Activity - WBAT Disposition - Skilled nursing facility Condition Upon Discharge - Good D/C Meds - See DC Summary DVT Prophylaxis - Xarelto  PERKINS, ALEXZANDREW 06/30/2012, 6:47 AM

## 2012-06-30 NOTE — Progress Notes (Signed)
Patient voiding without difficulty. Assist x along with the front wheel walker enables patient to ambulated to  Restroom. Daughter at the bedside. Patient with no complaints of discomfort at this time. Will continue to monitor  patient per Doctor order and unit protocol

## 2012-06-30 NOTE — Progress Notes (Signed)
Physical Therapy Treatment Patient Details Name: [name] [name] MRN: [id number] DOB: [birth year] Today's Date: 06/30/2012 Time: 0921-0955 PT Time Calculation (min): 34 min  PT Assessment / Plan / Recommendation Comments on Treatment Session       Follow Up Recommendations  SNF     Does the patient have the potential to tolerate intense rehabilitation     Barriers to Discharge        Equipment Recommendations  Rolling walker with 5" wheels    Recommendations for Other Services OT consult  Frequency 7X/week   Plan Discharge plan remains appropriate;Frequency remains appropriate    Precautions / Restrictions Precautions Precautions: Knee Required Braces or Orthoses: Knee Immobilizer - Left Knee Immobilizer - Left: Discontinue once straight leg raise with < 10 degree lag (Pt performed IND SLR this am) Restrictions Weight Bearing Restrictions: No Other Position/Activity Restrictions: WBAT   Pertinent Vitals/Pain 4/10; premed, ice packs provided    Mobility  Bed Mobility Bed Mobility: Supine to Sit Supine to Sit: 4: Min assist Transfers Transfers: Sit to Stand;Stand to Sit Sit to Stand: 4: Min assist;With upper extremity assist;From bed Stand to Sit: 4: Min assist;With upper extremity assist;With armrests;To chair/3-in-1 Details for Transfer Assistance: min verbal cues for hand placement and L LE out in front. Ambulation/Gait Ambulation/Gait Assistance: 4: Min assist Ambulation Distance (Feet): 100 Feet Assistive device: Rolling walker Ambulation/Gait Assistance Details: cues for posture, sequence, position from RW and stride length Gait Pattern: Step-to pattern;Decreased step length - right;Decreased step length - left;Antalgic    Exercises Total Joint Exercises Ankle Circles/Pumps: AROM;Both;Supine;20 reps Quad Sets: AROM;Both;Supine;20 reps Short Arc QuadBarbaraann Boys;Left;Supine;20 reps;AROM Heel Slides: AAROM;Left;Supine;20 reps Straight Leg Raises: AAROM;20  reps;Left;Supine;AROM;10 reps   PT Diagnosis:    PT Problem List:   PT Treatment Interventions:     PT Goals Acute Rehab PT Goals PT Goal Formulation: With patient/family Time For Goal Achievement: 07/12/12 Potential to Achieve Goals: Good Pt will go Supine/Side to Sit: with modified independence PT Goal: Supine/Side to Sit - Progress: Progressing toward goal Pt will go Sit to Stand: with supervision PT Goal: Sit to Stand - Progress: Progressing toward goal Pt will Ambulate: 51 - 150 feet;with supervision;with rolling walker PT Goal: Ambulate - Progress: Progressing toward goal Pt will Perform Home Exercise Program: with min assist PT Goal: Perform Home Exercise Program - Progress: Progressing toward goal  Visit Information  Last PT Received On: 06/30/12 Assistance Needed: +1    Subjective Data  Subjective: I think it hurts a little more than yesterday Patient Stated Goal: return to water aerobics   Cognition  Cognition Overall Cognitive Status: Appears within functional limits for tasks assessed/performed Arousal/Alertness: Awake/alert Orientation Level: Appears intact for tasks assessed Behavior During Session: [facility] for tasks performed    Balance     End of Session PT - End of Session Equipment Utilized During Treatment: Gait belt Activity Tolerance: Patient tolerated treatment well Patient left: with family/visitor present;in chair;with call bell/phone within reach Nurse Communication: Mobility status   GP     Adalia Pettis 06/30/2012, 12:42 PM

## 2012-07-04 ENCOUNTER — Non-Acute Institutional Stay (SKILLED_NURSING_FACILITY): Payer: Medicare Other | Admitting: Adult Health

## 2012-07-04 DIAGNOSIS — K5900 Constipation, unspecified: Secondary | ICD-10-CM

## 2012-07-04 DIAGNOSIS — M1710 Unilateral primary osteoarthritis, unspecified knee: Secondary | ICD-10-CM

## 2012-07-04 DIAGNOSIS — F329 Major depressive disorder, single episode, unspecified: Secondary | ICD-10-CM

## 2012-07-04 DIAGNOSIS — J189 Pneumonia, unspecified organism: Secondary | ICD-10-CM

## 2012-07-08 ENCOUNTER — Emergency Department (HOSPITAL_COMMUNITY): Payer: Medicare Other

## 2012-07-08 ENCOUNTER — Encounter (HOSPITAL_COMMUNITY): Payer: Self-pay | Admitting: Emergency Medicine

## 2012-07-08 ENCOUNTER — Inpatient Hospital Stay (HOSPITAL_COMMUNITY)
Admission: EM | Admit: 2012-07-08 | Discharge: 2012-07-09 | DRG: 603 | Disposition: A | Discharge status: Home Health | Payer: Medicare Other | Attending: Orthopedic Surgery | Admitting: Orthopedic Surgery

## 2012-07-08 DIAGNOSIS — R5383 Other fatigue: Secondary | ICD-10-CM | POA: Diagnosis present

## 2012-07-08 DIAGNOSIS — L02419 Cutaneous abscess of limb, unspecified: Principal | ICD-10-CM | POA: Diagnosis present

## 2012-07-08 DIAGNOSIS — D649 Anemia, unspecified: Secondary | ICD-10-CM | POA: Diagnosis present

## 2012-07-08 DIAGNOSIS — Z96659 Presence of unspecified artificial knee joint: Secondary | ICD-10-CM

## 2012-07-08 DIAGNOSIS — H5440 Blindness, one eye, unspecified eye: Secondary | ICD-10-CM | POA: Diagnosis present

## 2012-07-08 DIAGNOSIS — L089 Local infection of the skin and subcutaneous tissue, unspecified: Secondary | ICD-10-CM

## 2012-07-08 DIAGNOSIS — R5381 Other malaise: Secondary | ICD-10-CM | POA: Diagnosis present

## 2012-07-08 DIAGNOSIS — M25469 Effusion, unspecified knee: Secondary | ICD-10-CM | POA: Diagnosis present

## 2012-07-08 LAB — CBC WITH DIFFERENTIAL/PLATELET
Basophils Absolute: 0 10*3/uL (ref 0.0–0.1)
Eosinophils Relative: 1 % (ref 0–5)
HCT: 32.1 % — ABNORMAL LOW (ref 36.0–46.0)
Hemoglobin: 10.4 g/dL — ABNORMAL LOW (ref 12.0–15.0)
Lymphocytes Relative: 12 % (ref 12–46)
MCHC: 32.4 g/dL (ref 30.0–36.0)
MCV: 94.4 fL (ref 78.0–100.0)
Monocytes Absolute: 0.7 10*3/uL (ref 0.1–1.0)
Monocytes Relative: 8 % (ref 3–12)
Neutro Abs: 7 10*3/uL (ref 1.7–7.7)
RDW: 13.8 % (ref 11.5–15.5)
WBC: 8.8 10*3/uL (ref 4.0–10.5)

## 2012-07-08 LAB — COMPREHENSIVE METABOLIC PANEL
BUN: 9 mg/dL (ref 6–23)
CO2: 28 mEq/L (ref 19–32)
Calcium: 9.2 mg/dL (ref 8.4–10.5)
Chloride: 96 mEq/L (ref 96–112)
Creatinine, Ser: 0.48 mg/dL — ABNORMAL LOW (ref 0.50–1.10)
GFR calc Af Amer: >90 mL/min (ref >90)
GFR calc non Af Amer: 87 mL/min — ABNORMAL LOW (ref >90)
Glucose, Bld: 149 mg/dL — ABNORMAL HIGH (ref 70–99)
Total Bilirubin: 0.5 mg/dL (ref 0.3–1.2)

## 2012-07-08 LAB — URINALYSIS, ROUTINE W REFLEX MICROSCOPIC
Hgb urine dipstick: NEGATIVE
Leukocytes, UA: NEGATIVE
Nitrite: NEGATIVE
Protein, ur: NEGATIVE mg/dL
Specific Gravity, Urine: 1.012 (ref 1.005–1.030)
Urobilinogen, UA: 0.2 mg/dL (ref 0.0–1.0)

## 2012-07-08 MED ORDER — ASPIRIN EC 81 MG PO TBEC
81.0000 mg | DELAYED_RELEASE_TABLET | Freq: Every day | ORAL | Status: DC
  Administered 2012-07-08 – 2012-07-09 (×2): 81 mg via ORAL
  Filled 2012-07-08 (×2): qty 1

## 2012-07-08 MED ORDER — SODIUM CHLORIDE 0.9 % IV SOLN: 250.0000 mL | INTRAVENOUS | Status: DC | PRN

## 2012-07-08 MED ORDER — SODIUM CHLORIDE 0.9 % IJ SOLN: 3.0000 mL | Freq: Two times a day (BID) | INTRAMUSCULAR | Status: DC

## 2012-07-08 MED ORDER — CEFAZOLIN SODIUM 1-5 GM-% IV SOLN
1.0000 g | Freq: Three times a day (TID) | INTRAVENOUS | Status: DC
  Administered 2012-07-08 – 2012-07-09 (×2): 1 g via INTRAVENOUS
  Filled 2012-07-08 (×4): qty 50

## 2012-07-08 MED ORDER — ONDANSETRON HCL 4 MG PO TABS: 4.0000 mg | ORAL_TABLET | Freq: Four times a day (QID) | ORAL | Status: DC | PRN

## 2012-07-08 MED ORDER — SODIUM CHLORIDE 0.9 % IJ SOLN: 3.0000 mL | INTRAMUSCULAR | Status: DC | PRN

## 2012-07-08 MED ORDER — HYDROCODONE-ACETAMINOPHEN 5-325 MG PO TABS
1.0000 | ORAL_TABLET | ORAL | Status: DC | PRN
  Administered 2012-07-08: 1 via ORAL
  Administered 2012-07-09 (×3): 2 via ORAL
  Filled 2012-07-08: qty 2
  Filled 2012-07-08: qty 1
  Filled 2012-07-08 (×2): qty 2

## 2012-07-08 MED ORDER — FAMOTIDINE 20 MG PO TABS
20.0000 mg | ORAL_TABLET | Freq: Two times a day (BID) | ORAL | Status: DC
  Administered 2012-07-08 – 2012-07-09 (×2): 20 mg via ORAL
  Filled 2012-07-08 (×3): qty 1

## 2012-07-08 MED ORDER — ONDANSETRON HCL 4 MG/2ML IJ SOLN: 4.0000 mg | Freq: Four times a day (QID) | INTRAMUSCULAR | Status: DC | PRN

## 2012-07-08 MED ORDER — VANCOMYCIN HCL IN DEXTROSE 1-5 GM/200ML-% IV SOLN
1000.0000 mg | Freq: Once | INTRAVENOUS | Status: AC
  Administered 2012-07-08: 1000 mg via INTRAVENOUS
  Filled 2012-07-08: qty 200

## 2012-07-08 MED ORDER — CEFAZOLIN SODIUM 1-5 GM-% IV SOLN
1.0000 g | Freq: Once | INTRAVENOUS | Status: AC
  Administered 2012-07-08: 1 g via INTRAVENOUS
  Filled 2012-07-08: qty 50

## 2012-07-08 NOTE — ED Notes (Signed)
Pt had left knee replacement on 06/27/12, since around Wednesday (when first noticed) then knee has gotten red and has swelled up more. Redness has extended from above knee, down leg to top of ankle. Leg is warm to touch. Pt also preset with a cough that has been going on since Monday.

## 2012-07-08 NOTE — H&P (Signed)
Hollice Espy, MD Chief Complaint: Right knee pain History: Patient 10 days s/p right TKR with 2 day hx of erythema and swelling.  Patient transported from nsg home to ER for further eval and treatment.   Past Medical History  Diagnosis Date  . Arthritis   . Blind left eye   . Dysrhythmia   . Varicose veins     both legs and left fooot at arch  . Complication of anesthesia     sensitive to meds, bp can fall    Allergies  Allergen Reactions  . Ultram (Tramadol) Hives  . Buprenex (Buprenorphine Hcl) Other (See Comments)    Heart raced  . Levofloxacin Other (See Comments)    levaquin unknown reaction  . Percocet (Oxycodone-Acetaminophen) Itching    "felt drained"  . Sulfa Antibiotics Other (See Comments)    unknown  . Tape     Adhesive tape causes skin to pull off  . Zetia (Ezetimibe) Other (See Comments)    unknown    No current facility-administered medications on file prior to encounter.   Current Outpatient Prescriptions on File Prior to Encounter  Medication Sig Dispense Refill  . diphenhydrAMINE (BENADRYL) 12.5 MG/5ML elixir Take 5-10 mLs (12.5-25 mg total) by mouth every 4 (four) hours as needed for itching.  120 mL  0  . metoprolol tartrate (LOPRESSOR) 25 MG tablet Take 25 mg by mouth at bedtime as needed (for heartbeat racing).       . ondansetron (ZOFRAN) 4 MG tablet Take 1 tablet (4 mg total) by mouth every 6 (six) hours as needed for nausea.  40 tablet  0  . PARoxetine (PAXIL) 10 MG tablet Take 10 mg by mouth daily.       . prednisoLONE acetate (PRED FORTE) 1 % ophthalmic suspension Place 1 drop into both eyes 2 (two) times daily.       . sodium chloride (MURO 128) 2 % ophthalmic solution Place 1 drop into both eyes daily as needed (dry eyes).        Physical Exam: Filed Vitals:   07/08/12 1741  BP: 174/81  Pulse: 71  Temp: 98.6 F (37 C)  Resp: 20   A+O X3 NVI erythema at wound edges.  No dehiscence or drainage Compartments soft/NT Intact DP/PT  pulses No sob/cp abd soft/nt No knee/ankle pain.   Image: Dg Chest 2 View  07/08/2012  *RADIOLOGY REPORT*  Clinical Data: Cough, congestion.  CHEST - 2 VIEW  Comparison: 12/14/2011  Findings: Small bilateral pleural effusions.  Heart is borderline in size.  No confluent airspace opacities.  No edema.  No acute bony abnormality.  IMPRESSION: Small bilateral pleural effusions.   Original Report Authenticated By: Charlett Nose, M.D.    Dg Knee Complete 4 Views Left  07/08/2012  *RADIOLOGY REPORT*  Clinical Data: Knee pain.  LEFT KNEE - COMPLETE 4+ VIEW  Comparison: None.  Findings: Small left knee pleural effusion. Diffuse soft tissue swelling.  Prior left knee replacement.  No hardware complicating feature.  No fracture, subluxation or dislocation.  IMPRESSION: Soft tissue swelling and small joint effusion.  Prior left knee replacement.  No hardware or bony acute findings.   Original Report Authenticated By: Charlett Nose, M.D.     A/P:  Patient s/p right TKR with 1-2 day history of swollen right knee with erythema and increased pain.  Pateint brought to ER by daughter due to concerns about infection. 1. Spoke with Dr Despina Hick - will see patient in AM 2. Admit IV  abx and observation 3. No new recommendations at this time

## 2012-07-08 NOTE — ED Provider Notes (Addendum)
History     CSN: [id number]  Arrival date & time 07/08/12  1333   First MD Initiated Contact with Patient 07/08/12 1523      Chief Complaint  Patient presents with  . Knee Pain    (Consider location/radiation/quality/duration/timing/severity/associated sxs/prior treatment) HPI Complains of left knee swelling and redness onset yesterday. Symptoms have become progressively worse accompanying symptoms include Gen. malaise and cough. Cough has been going on since 07/04/2012 red admits to diminished appetite no nausea or vomiting also admits to generalized weakness maximum temperature 100.5. Treated with Tylenol earlier today. The patient is status post left total knee replacement 06/27/2012. Past Medical History  Diagnosis Date  . Arthritis   . Blind left eye   . Dysrhythmia   . Varicose veins     both legs and left fooot at arch  . Complication of anesthesia     sensitive to meds, bp can fall    Past Surgical History  Procedure Laterality Date  . Cholecystectomy    . Tonsillectomy    . Tonsillectomy    . Corneal transplant  right 2009, left 2009    both eyes, 3 in left eye, 1 in right eye  . Cataract surgery  2005    both eyes  . Abdominal hysterectomy   sept 1985    ovaries removed  . Total knee arthroplasty Left 06/27/2012    Procedure: LEFT TOTAL KNEE ARTHROPLASTY;  Surgeon: Loanne Drilling, MD;  Location: [facility];  Service: Orthopedics;  Laterality: Left;    No family history on file.  History  Substance Use Topics  . Smoking status: Never Smoker   . Smokeless tobacco: Never Used  . Alcohol Use: No    OB History   Grav Para Term Preterm Abortions TAB SAB Ect Mult Living                  Review of Systems  Constitutional: Positive for fever, chills, appetite change and fatigue.  Respiratory: Positive for cough.   Skin:       Redness of left knee and leg, otherwise without rash  All other systems reviewed and are negative.    Allergies  Buprenex;  Levofloxacin; Percocet; Sulfa antibiotics; Tape; and Zetia  Home Medications   Current Outpatient Rx  Name  Route  Sig  Dispense  Refill  . acetaminophen (TYLENOL) 325 MG tablet   Oral   Take 650 mg by mouth every 6 (six) hours as needed for pain.         . bisacodyl (DULCOLAX) 10 MG suppository   Rectal   Place 10 mg rectally daily as needed for constipation.         . diphenhydrAMINE (BENADRYL) 12.5 MG/5ML elixir   Oral   Take 5-10 mLs (12.5-25 mg total) by mouth every 4 (four) hours as needed for itching.   120 mL   0   . docusate sodium (COLACE) 100 MG capsule   Oral   Take 100 mg by mouth 2 (two) times daily.         Marland Kitchen doxycycline (VIBRA-TABS) 100 MG tablet   Oral   Take 100 mg by mouth 2 (two) times daily. 10 day course of therapy started 07/04/12.         Marland Kitchen guaiFENesin (MUCINEX) 600 MG 12 hr tablet   Oral   Take 600 mg by mouth 2 (two) times daily.         Marland Kitchen HYDROcodone-acetaminophen (NORCO/VICODIN) 5-325 MG per tablet  Oral   Take 1-2 tablets by mouth every 4 (four) hours as needed for pain.         Marland Kitchen HYDROmorphone (DILAUDID) 2 MG tablet   Oral   Take 2-4 mg by mouth every 4 (four) hours as needed for pain.         . methocarbamol (ROBAXIN) 500 MG tablet   Oral   Take 500 mg by mouth every 6 (six) hours as needed (for muscle spasms).         . metoprolol tartrate (LOPRESSOR) 25 MG tablet   Oral   Take 25 mg by mouth at bedtime as needed (for heartbeat racing).          . ondansetron (ZOFRAN) 4 MG tablet   Oral   Take 1 tablet (4 mg total) by mouth every 6 (six) hours as needed for nausea.   40 tablet   0   . PARoxetine (PAXIL) 10 MG tablet   Oral   Take 10 mg by mouth daily.          . polyethylene glycol (MIRALAX / GLYCOLAX) packet   Oral   Take 17 g by mouth daily.         . prednisoLONE acetate (PRED FORTE) 1 % ophthalmic suspension   Both Eyes   Place 1 drop into both eyes 2 (two) times daily.          . rivaroxaban  (XARELTO) 10 MG TABS tablet   Oral   Take 10 mg by mouth daily. 2.5 week course of therapy started 06/30/12.         . sodium chloride (MURO 128) 2 % ophthalmic solution   Both Eyes   Place 1 drop into both eyes daily as needed (dry eyes).           BP 171/67  Pulse 81  Temp(Src) 99.1 F (37.3 C) (Rectal)  Resp 18  SpO2 95%  Physical Exam  Nursing note and vitals reviewed. Constitutional: She appears well-developed and well-nourished. No distress.  HENT:  Head: Normocephalic and atraumatic.  Eyes: EOM are normal.  Left cornea opacified  Neck: Neck supple. No tracheal deviation present. No thyromegaly present.  Cardiovascular: Normal rate and regular rhythm.   No murmur heard. Pulmonary/Chest: Effort normal and breath sounds normal.  Abdominal: Soft. Bowel sounds are normal. She exhibits no distension. There is no tenderness.  Musculoskeletal: Normal range of motion. She exhibits no edema and no tenderness.  Left lower extremity: Steri-Strips surgical wound over the anterior knee wound is clean-appearing knee and shin reddened swollen mildly tender. All other extremities no redness swelling or tenderness neurovascularly intact TP pulses 2+ bilaterally no inguinal lymphadenopathy  Neurological: She is alert. Coordination normal.  Skin: Skin is warm and dry. No rash noted.  Psychiatric: She has a normal mood and affect.    ED Course  Procedures (including critical care time)  Labs Reviewed  CBC WITH DIFFERENTIAL - Abnormal; Notable for the following:    RBC 3.40 (*)    Hemoglobin 10.4 (*)    HCT 32.1 (*)    Neutrophils Relative 80 (*)    All other components within normal limits  COMPREHENSIVE METABOLIC PANEL - Abnormal; Notable for the following:    Sodium 133 (*)    Glucose, Bld 149 (*)    Creatinine, Ser 0.48 (*)    Albumin 2.8 (*)    AST 46 (*)    ALT 46 (*)    Alkaline Phosphatase 724 (*)  GFR calc non Af Amer 87 (*)    All other components within normal  limits  URINALYSIS, ROUTINE W REFLEX MICROSCOPIC   No results found.   No diagnosis found.  Results for orders placed during the hospital encounter of 07/08/12  CBC WITH DIFFERENTIAL      Result Value Range   WBC 8.8  4.0 - 10.5 K/uL   RBC 3.40 (*) 3.87 - 5.11 MIL/uL   Hemoglobin 10.4 (*) 12.0 - 15.0 g/dL   HCT 16.1 (*) 09.6 - 04.5 %   MCV 94.4  78.0 - 100.0 fL   MCH 30.6  26.0 - 34.0 pg   MCHC 32.4  30.0 - 36.0 g/dL   RDW 40.9  81.1 - 91.4 %   Platelets 332  150 - 400 K/uL   Neutrophils Relative 80 (*) 43 - 77 %   Neutro Abs 7.0  1.7 - 7.7 K/uL   Lymphocytes Relative 12  12 - 46 %   Lymphs Abs 1.1  0.7 - 4.0 K/uL   Monocytes Relative 8  3 - 12 %   Monocytes Absolute 0.7  0.1 - 1.0 K/uL   Eosinophils Relative 1  0 - 5 %   Eosinophils Absolute 0.1  0.0 - 0.7 K/uL   Basophils Relative 0  0 - 1 %   Basophils Absolute 0.0  0.0 - 0.1 K/uL  COMPREHENSIVE METABOLIC PANEL      Result Value Range   Sodium 133 (*) 135 - 145 mEq/L   Potassium 3.8  3.5 - 5.1 mEq/L   Chloride 96  96 - 112 mEq/L   CO2 28  19 - 32 mEq/L   Glucose, Bld 149 (*) 70 - 99 mg/dL   BUN 9  6 - 23 mg/dL   Creatinine, Ser 7.82 (*) 0.50 - 1.10 mg/dL   Calcium 9.2  8.4 - 95.6 mg/dL   Total Protein 6.1  6.0 - 8.3 g/dL   Albumin 2.8 (*) 3.5 - 5.2 g/dL   AST 46 (*) 0 - 37 U/L   ALT 46 (*) 0 - 35 U/L   Alkaline Phosphatase 724 (*) 39 - 117 U/L   Total Bilirubin 0.5  0.3 - 1.2 mg/dL   GFR calc non Af Amer 87 (*) >90 mL/min   GFR calc Af Amer >90  >90 mL/min  URINALYSIS, ROUTINE W REFLEX MICROSCOPIC      Result Value Range   Color, Urine YELLOW  YELLOW   APPearance CLEAR  CLEAR   Specific Gravity, Urine 1.012  1.005 - 1.030   pH 7.5  5.0 - 8.0   Glucose, UA NEGATIVE  NEGATIVE mg/dL   Hgb urine dipstick NEGATIVE  NEGATIVE   Bilirubin Urine NEGATIVE  NEGATIVE   Ketones, ur NEGATIVE  NEGATIVE mg/dL   Protein, ur NEGATIVE  NEGATIVE mg/dL   Urobilinogen, UA 0.2  0.0 - 1.0 mg/dL   Nitrite NEGATIVE  NEGATIVE    Leukocytes, UA NEGATIVE  NEGATIVE   No results found.   MDM  Spoke with Dr. Shon Baton plan admit to medical surgical floor for intravenous antibiotics. Vancomycin and Ancef ordered here at his request Concern for septic joint versus cellulitis of left lower extremity Diagnosis: #1Soft tissue infection of left lower extremity #2 anemia        Doug Sou, MD 07/08/12 1534  Doug Sou, MD 07/08/12 1610

## 2012-07-08 NOTE — Progress Notes (Signed)
Patient admitted to [facility] from ED.  Patient awake alert and oriented x4.  Daughter at bedside.  Patient is not complaining of any pain at this time.  Bed in low position.  Call bell within reach.  Bed alarm on for safety.  Instructed patient ot use call bell for assistance.  Patient resting comfortably.  Will monitor.

## 2012-07-08 NOTE — ED Notes (Signed)
Patient transported to X-ray 

## 2012-07-08 NOTE — ED Notes (Signed)
Pt had surgery and sent over to [facility] and began have intermit cough, and lt knee warm to touch and swelling, pt does no feel well since d/c.

## 2012-07-08 NOTE — ED Notes (Signed)
Attempted hourly rounding but pt was in x-ray.  Introduced myself to family in room and will return later

## 2012-07-09 LAB — SEDIMENTATION RATE: Sed Rate: 53 mm/hr — ABNORMAL HIGH (ref 0–22)

## 2012-07-09 MED ORDER — CEPHALEXIN 250 MG PO CAPS: 250.0000 mg | ORAL_CAPSULE | Freq: Three times a day (TID) | ORAL | Status: DC

## 2012-07-09 MED ORDER — HYDROCODONE-ACETAMINOPHEN 5-325 MG PO TABS: 1.0000 | ORAL_TABLET | ORAL | Status: DC | PRN

## 2012-07-09 NOTE — Progress Notes (Signed)
Subjective: Patient without knee complaints. Feels better today. Has main complaint of cough   Objective: Vital signs in last 24 hours: Temp:  [98.4 F (36.9 C)-99.2 F (37.3 C)] 98.4 F (36.9 C) (04/19 0559) Pulse Rate:  [69-81] 69 (04/19 0559) Resp:  [18-20] 18 (04/18 2200) BP: (145-174)/(67-81) 145/69 mmHg (04/19 0559) SpO2:  [93 %-95 %] 93 % (04/19 0559) Weight:  [176 lb 9.4 oz (80.1 kg)] 176 lb 9.4 oz (80.1 kg) (04/18 1741)  Intake/Output from previous day:   Intake/Output this shift:     Recent Labs  07/08/12 1400  HGB 10.4*    Recent Labs  07/08/12 1400  WBC 8.8  RBC 3.40*  HCT 32.1*  PLT 332    Recent Labs  07/08/12 1400  NA 133*  K 3.8  CL 96  CO2 28  BUN 9  CREATININE 0.48*  GLUCOSE 149*  CALCIUM 9.2   No results found for this basename: LABPT, INR,  in the last 72 hours  Neurologically intact Neurovascular intact Incision: dressing C/D/I Compartment soft Bending knee well without pain. no drainage or peri-incisional erythema. Mild redness distally lower leg  CXR- No pneumonia. Small bilat effusions  Assessment/Plan: Left TKA- Absolutely no signs of septic knee. May have had cellulitis which is already resolving. Will do session of PT today and then discharge home. Will do 5 days of Keflex to complete treatment of the cellulitis. Will have HHPT arranged. Has F/U 4/24   [name] [name] 07/09/2012, 8:27 AM

## 2012-07-09 NOTE — Care Management Note (Signed)
   CARE MANAGEMENT NOTE 07/09/2012  Patient:  [name] [name],[name] [name]   Account Number:  [id number]  Date Initiated:  07/09/2012  Documentation initiated by:  Raiford Noble  Subjective/Objective Assessment:   admit with celluitis     Action/Plan:   to have home health services- PT   Anticipated DC Date:  07/09/2012   Anticipated DC Plan:  HOME W HOME HEALTH SERVICES  In-house referral  NA      DC Planning Services  CM consult      PAC Choice  DURABLE MEDICAL EQUIPMENT  HOME HEALTH   Choice offered to / List presented to:  C-1 Patient   DME arranged  WALKER - ROLLING  3-N-1      DME agency  [facility]     HH arranged  HH-2 PT      [facility] agency  [facility]   Status of service:  Completed, signed off Medicare Important Message given?   (If response is "NO", the following Medicare IM given date fields will be blank) Date Medicare IM given:   Date Additional Medicare IM given:    Discharge Disposition:  HOME W HOME HEALTH SERVICES  Per UR Regulation:    If discussed at Long Length of Stay Meetings, dates discussed:    Comments:  07/09/12 Received call from patient's nurse stating patient will need HHC PT services. Offered choice at bedside. Patient chose [facility] for PT services. Spoke with [facility] intake center to make referral and referral accepted. Orders and face sheet faxed with fax confirmation received. Patient to have 3 IN 1 and rolling walker delivered to room shortly per [facility] DME rep. Made patient and son aware of this. Also made nursing aware as well. Tyana Butzer,RNCM [phone]

## 2012-07-09 NOTE — Evaluation (Signed)
Physical Therapy Evaluation Patient Details Name: [name] [name] MRN: [id number] DOB: 1925/01/19 Today's Date: 07/09/2012 Time: 4098-1191 PT Time Calculation (min): 28 min  PT Assessment / Plan / Recommendation Clinical Impression  79 yo female admitted with L LE cellulitis. Recent L TKA ~1 week ago. On eval, pt required supervision level assist for mobility-able to ambulate~200 feet with RW. Gait is somewhat antalgic at this time so recommend RW use until pain improves. Recommend HHPT to improve strength, ROM, gait and balance in order to maximize independence and functional mobility.     PT Assessment  Patient needs continued PT services    Follow Up Recommendations  Home health PT    Does the patient have the potential to tolerate intense rehabilitation      Barriers to Discharge        Equipment Recommendations  Rolling walker with 5" wheels;Other (comment) (3n1)    Recommendations for Other Services     Frequency 7X/week    Precautions / Restrictions Precautions Precautions: Knee Restrictions Weight Bearing Restrictions: No LLE Weight Bearing: Weight bearing as tolerated   Pertinent Vitals/Pain 5/10 L knee with activity      Mobility  Bed Mobility Bed Mobility: Not assessed Transfers Transfers: Sit to Stand;Stand to Sit Sit to Stand: From chair/3-in-1;From toilet;5: Supervision Stand to Sit: To chair/3-in-1;To toilet;5: Supervision Details for Transfer Assistance: VCs safety, hand placement. Pt relied on grab bar and bathroom sink to rise and lower. Ambulation/Gait Ambulation/Gait Assistance: 5: Supervision Ambulation Distance (Feet): 200 Feet Assistive device: Rolling walker Ambulation/Gait Assistance Details: VCS distance from RW.  Gait Pattern: Step-through pattern;Antalgic;Decreased stride length    Exercises Total Joint Exercises Ankle Circles/Pumps: AROM;Both;15 reps;Seated Quad Sets: AROM;Both;10 reps;Seated Hip ABduction/ADduction: AROM;Left;10  reps;Seated Straight Leg Raises: AROM;Left;10 reps;Seated Long Arc Quad: AROM;Left;10 reps;Seated Knee Flexion: AROM;Left;10 reps;Seated Goniometric ROM: 5-95 degrees   PT Diagnosis: Difficulty walking;Abnormality of gait;Acute pain  PT Problem List: Decreased strength;Decreased range of motion;Decreased activity tolerance;Decreased mobility;Pain;Decreased knowledge of use of DME;Impaired sensation PT Treatment Interventions:     PT Goals Acute Rehab PT Goals PT Goal Formulation: With patient Time For Goal Achievement: 07/16/12 Potential to Achieve Goals: Good Pt will go Sit to Stand: with modified independence PT Goal: Sit to Stand - Progress: Goal set today Pt will Ambulate: >150 feet;with modified independence;with least restrictive assistive device PT Goal: Ambulate - Progress: Goal set today Pt will Perform Home Exercise Program: Independently PT Goal: Perform Home Exercise Program - Progress: Goal set today  Visit Information  Last PT Received On: 07/09/12 Assistance Needed: +1    Subjective Data  Subjective: Every now and agin I get a pain that is like lightening through that knee Patient Stated Goal: home   Prior Functioning  Home Living Lives With: Family Available Help at Discharge: Family Type of Home: House Home Access: Ramped entrance Home Layout: Multi-level Alternate Level Stairs-Number of Steps: split level home-pt is planning to live on one level of family's home until she feel comfortable going up and down steps Home Adaptive Equipment: Wheelchair - manual;Shower chair with back Prior Function Level of Independence: Independent Able to Take Stairs?: Yes Comments: blind l eye. pt was independent prior to l tka ~2 weeks ago. returned to hospital from [facility] this admission Communication Communication: [facility]    Cognition  Cognition Arousal/Alertness: Awake/alert Behavior During Therapy: WFL for tasks assessed/performed Overall Cognitive Status:  Within Functional Limits for tasks assessed    Extremity/Trunk Assessment Right Lower Extremity Assessment RLE ROM/Strength/Tone: [facility] for  tasks assessed Left Lower Extremity Assessment LLE ROM/Strength/Tone: Deficits LLE ROM/Strength/Tone Deficits: hip flex 3+/5, moves ankle well, hip abd 3/5 LLE Sensation: Deficits LLE Sensation Deficits: experiencing sharp pains "like lightening"; also some hypersensitivity to knee area as well Trunk Assessment Trunk Assessment: Normal   Balance    End of Session PT - End of Session Activity Tolerance: Patient tolerated treatment well Patient left: in chair;with call bell/phone within reach;with family/visitor present  GP     [name] Alert, MPT Pager: [phone]

## 2012-07-12 ENCOUNTER — Encounter: Payer: Self-pay | Admitting: Adult Health

## 2012-07-12 DIAGNOSIS — F329 Major depressive disorder, single episode, unspecified: Secondary | ICD-10-CM | POA: Insufficient documentation

## 2012-07-12 DIAGNOSIS — J189 Pneumonia, unspecified organism: Secondary | ICD-10-CM | POA: Insufficient documentation

## 2012-07-12 DIAGNOSIS — K5900 Constipation, unspecified: Secondary | ICD-10-CM | POA: Insufficient documentation

## 2012-07-12 NOTE — Progress Notes (Signed)
  Subjective:    Patient ID: [name] [name], female    DOB: 1924/12/20, 79 y.o.   MRN: [id number] lear HPI This is an 79 year old female who complained of coughing. T=100 degrees F. Patient is not short of breath and just finished ventolin nebulization. Chest X-ray shows mild bronchitis. Patient is at [facility] for a short-term rehabilitation S/P Left Total Knee Replacement. Daughter is at bedside.  Review of Systems  Constitutional: Positive for fever.  HENT: Negative.   Eyes: Negative.   Respiratory: Positive for shortness of breath. Negative for wheezing.   Cardiovascular: Positive for leg swelling. Negative for chest pain.  Gastrointestinal: Negative.   Endocrine: Negative.   Genitourinary: Negative.   Neurological: Negative.   Hematological: Negative for adenopathy. Does not bruise/bleed easily.  Psychiatric/Behavioral: Negative.        Objective:   Physical Exam  Nursing note and vitals reviewed. Constitutional: She is oriented to person, place, and time. She appears well-developed and well-nourished.  HENT:  Head: Normocephalic and atraumatic.  Right Ear: External ear normal.  Left Ear: External ear normal.  Eyes: Conjunctivae are normal.  Left eye is blind  Neck: Normal range of motion. Neck supple. No thyromegaly present.  Cardiovascular: Normal rate, regular rhythm and normal heart sounds.   Pulmonary/Chest: Effort normal and breath sounds normal. No respiratory distress. She has no wheezes.  Abdominal: Soft. Bowel sounds are normal.  Musculoskeletal: She exhibits edema. She exhibits no tenderness.  LLE edema , 2+  Neurological: She is alert and oriented to person, place, and time.  Skin: Skin is warm and dry.  Psychiatric: She has a normal mood and affect. Her behavior is normal. Judgment and thought content normal.          Assessment & Plan:  Pneumonitis  - start Doxycycline 100 mg 1 PO BID x 10 days and Mucinex 600 mg PO Q 12 hours x 2 weeks  OA  (osteoarthritis) of knee S/P Left total knee replacement - continue PT/OT  Unspecified constipation - no complaints  Depression - stable

## 2012-07-19 ENCOUNTER — Telehealth: Payer: Self-pay | Admitting: Cardiovascular Disease

## 2012-07-19 NOTE — Telephone Encounter (Signed)
New problem   Pt has been having rapid heartbeat and elevated BP and has had to take prescribe medication 3 times in last week. Pt want to come in and see doctor if needed. Please call pt.

## 2012-07-19 NOTE — Telephone Encounter (Signed)
I spoke with the pt and she complains of 3 nights this week developing a pounding in her chest.  The pt denies a rapid pulse. This morning at 2 AM the pt could not sleep. She did notice that her pulse was faster than normal but it was not racing. The pt has been taking Metoprolol Tartrate 25mg  as needed.  The pt recently had knee surgery and developed an infection.  I made the pt aware that she is okay to take Metoprolol Tartrate 25mg  twice a day if needed.  At this time the pt would like to take Metoprolol Tartrate 25mg  every evening for one week and see if this helps her symptoms.  I made the pt aware that she can contact the office with any other questions or concerns. Pt agreed with plan.

## 2012-07-21 ENCOUNTER — Telehealth: Payer: Self-pay | Admitting: Cardiovascular Disease

## 2012-07-21 MED ORDER — DILTIAZEM HCL ER COATED BEADS 120 MG PO CP24: 120.0000 mg | ORAL_CAPSULE | Freq: Every day | ORAL | Status: DC

## 2012-07-21 NOTE — Telephone Encounter (Signed)
Pt advised, she verbalized understanding. 

## 2012-07-21 NOTE — Telephone Encounter (Signed)
New problem   Pt would like to speak to nurse concerning the Metoprolol tartrate 25mg . They want to talk about changing it to another medication because of current side affects. Please call

## 2012-07-21 NOTE — Telephone Encounter (Signed)
Would stop metoprolol and try cardizem CD 120 mg daily at HS. thx

## 2012-07-21 NOTE — Telephone Encounter (Signed)
When I saw her, she was only taking metoprolol as needed. Is she now taking daily?

## 2012-07-21 NOTE — Telephone Encounter (Signed)
Pt spoke with Leotis Shames, Dr. Earmon Phoenix nurse, on 4/29 concerning pounding in her chest that is preventing pt from sleeping. Pt was advised that she could take Metoprolol 25 mg up to 2 times daily so the pt has been taking Metoprolol at bedtime every night since.

## 2012-07-21 NOTE — Telephone Encounter (Signed)
Pts daughter states that the pt has been having difficulty sleeping and c/o worsening vision. She states that she looked up side effects for Metoprolol and these s/s were listed as a side effect. Pt takes Metoprolol 25 mg prn for heart racing. She wants to know if there is another medication her mother can take in place of Metoprolol. Please advise.

## 2012-07-24 NOTE — Discharge Summary (Signed)
Physician Discharge Summary   Patient ID: [name] [name] MRN: [id number] DOB/AGE: Oct 02, 1924 79 y.o.  Admit date: 07/08/2012 Discharge date: 07/09/2012  Primary Diagnosis: Possible infected total  Knee  Admission Diagnoses:  Past Medical History  Diagnosis Date  . Arthritis   . Blind left eye   . Dysrhythmia   . Varicose veins     both legs and left fooot at arch  . Complication of anesthesia     sensitive to meds, bp can fall   Discharge Diagnoses:   Possible Cellulitis S/P  Left TKA   Estimated body mass index is 32.29 kg/(m^2) as calculated from the following:   Height as of this encounter: 5\' 2"  (1.575 m).   Weight as of this encounter: 80.1 kg (176 lb 9.4 oz).  Procedure:  None  Consults: None  HPI: Patient 10 days s/p right TKR with 2 day hx of erythema and swelling. Patient transported from nsg home to ER for further eval and treatment.   Laboratory Data: Admission on 07/08/2012, Discharged on 07/09/2012  Component Date Value Range Status  . WBC 07/08/2012 8.8  4.0 - 10.5 K/uL Final  . RBC 07/08/2012 3.40* 3.87 - 5.11 MIL/uL Final  . Hemoglobin 07/08/2012 10.4* 12.0 - 15.0 g/dL Final  . HCT 40/98/1191 32.1* 36.0 - 46.0 % Final  . MCV 07/08/2012 94.4  78.0 - 100.0 fL Final  . MCH 07/08/2012 30.6  26.0 - 34.0 pg Final  . MCHC 07/08/2012 32.4  30.0 - 36.0 g/dL Final  . RDW 47/82/9562 13.8  11.5 - 15.5 % Final  . Platelets 07/08/2012 332  150 - 400 K/uL Final  . Neutrophils Relative 07/08/2012 80* 43 - 77 % Final  . Neutro Abs 07/08/2012 7.0  1.7 - 7.7 K/uL Final  . Lymphocytes Relative 07/08/2012 12  12 - 46 % Final  . Lymphs Abs 07/08/2012 1.1  0.7 - 4.0 K/uL Final  . Monocytes Relative 07/08/2012 8  3 - 12 % Final  . Monocytes Absolute 07/08/2012 0.7  0.1 - 1.0 K/uL Final  . Eosinophils Relative 07/08/2012 1  0 - 5 % Final  . Eosinophils Absolute 07/08/2012 0.1  0.0 - 0.7 K/uL Final  . Basophils Relative 07/08/2012 0  0 - 1 % Final  . Basophils Absolute  07/08/2012 0.0  0.0 - 0.1 K/uL Final  . Sodium 07/08/2012 133* 135 - 145 mEq/L Final  . Potassium 07/08/2012 3.8  3.5 - 5.1 mEq/L Final  . Chloride 07/08/2012 96  96 - 112 mEq/L Final  . CO2 07/08/2012 28  19 - 32 mEq/L Final  . Glucose, Bld 07/08/2012 149* 70 - 99 mg/dL Final  . BUN 13/10/6576 9  6 - 23 mg/dL Final  . Creatinine, Ser 07/08/2012 0.48* 0.50 - 1.10 mg/dL Final  . Calcium 46/96/2952 9.2  8.4 - 10.5 mg/dL Final  . Total Protein 07/08/2012 6.1  6.0 - 8.3 g/dL Final  . Albumin 84/13/2440 2.8* 3.5 - 5.2 g/dL Final  . AST 01/17/2535 46* 0 - 37 U/L Final  . ALT 07/08/2012 46* 0 - 35 U/L Final  . Alkaline Phosphatase 07/08/2012 724* 39 - 117 U/L Final  . Total Bilirubin 07/08/2012 0.5  0.3 - 1.2 mg/dL Final  . GFR calc non Af Amer 07/08/2012 87* >90 mL/min Final  . GFR calc Af Amer 07/08/2012 >90  >90 mL/min Final   Comment:  The eGFR has been calculated                          using the CKD EPI equation.                          This calculation has not been                          validated in all clinical                          situations.                          eGFR's persistently                          <90 mL/min signify                          possible Chronic Kidney Disease.  . Color, Urine 07/08/2012 YELLOW  YELLOW Final  . APPearance 07/08/2012 CLEAR  CLEAR Final  . Specific Gravity, Urine 07/08/2012 1.012  1.005 - 1.030 Final  . pH 07/08/2012 7.5  5.0 - 8.0 Final  . Glucose, UA 07/08/2012 NEGATIVE  NEGATIVE mg/dL Final  . Hgb urine dipstick 07/08/2012 NEGATIVE  NEGATIVE Final  . Bilirubin Urine 07/08/2012 NEGATIVE  NEGATIVE Final  . Ketones, ur 07/08/2012 NEGATIVE  NEGATIVE mg/dL Final  . Protein, ur 30/86/5784 NEGATIVE  NEGATIVE mg/dL Final  . Urobilinogen, UA 07/08/2012 0.2  0.0 - 1.0 mg/dL Final  . Nitrite 69/62/9528 NEGATIVE  NEGATIVE Final  . Leukocytes, UA 07/08/2012 NEGATIVE  NEGATIVE Final   MICROSCOPIC NOT DONE ON  URINES WITH NEGATIVE PROTEIN, BLOOD, LEUKOCYTES, NITRITE, OR GLUCOSE <1000 mg/dL.  . Sed Rate 07/09/2012 53* 0 - 22 mm/hr Final  . CRP 07/09/2012 7.6* <0.60 mg/dL Final  Admission on 41/32/4401, Discharged on 06/30/2012  Component Date Value Range Status  . ABO/RH(D) 06/27/2012 A POS   Final  . Antibody Screen 06/27/2012 NEG   Final  . Sample Expiration 06/27/2012 06/30/2012   Final  . ABO/RH(D) 06/27/2012 A POS   Final  . WBC 06/28/2012 8.1  4.0 - 10.5 K/uL Final  . RBC 06/28/2012 3.72* 3.87 - 5.11 MIL/uL Final  . Hemoglobin 06/28/2012 11.6* 12.0 - 15.0 g/dL Final  . HCT 02/72/5366 34.3* 36.0 - 46.0 % Final  . MCV 06/28/2012 92.2  78.0 - 100.0 fL Final  . MCH 06/28/2012 31.2  26.0 - 34.0 pg Final  . MCHC 06/28/2012 33.8  30.0 - 36.0 g/dL Final  . RDW 44/05/4740 13.3  11.5 - 15.5 % Final  . Platelets 06/28/2012 167  150 - 400 K/uL Final  . Sodium 06/28/2012 128* 135 - 145 mEq/L Final  . Potassium 06/28/2012 3.8  3.5 - 5.1 mEq/L Final  . Chloride 06/28/2012 94* 96 - 112 mEq/L Final  . CO2 06/28/2012 31  19 - 32 mEq/L Final  . Glucose, Bld 06/28/2012 139* 70 - 99 mg/dL Final  . BUN 59/56/3875 10  6 - 23 mg/dL Final  . Creatinine, Ser 06/28/2012 0.51  0.50 - 1.10 mg/dL Final  . Calcium 64/33/2951 8.1* 8.4 - 10.5 mg/dL Final  . GFR calc non Af Amer 06/28/2012 85* >90 mL/min  Final  . GFR calc Af Amer 06/28/2012 >90  >90 mL/min Final   Comment:                                 The eGFR has been calculated                          using the CKD EPI equation.                          This calculation has not been                          validated in all clinical                          situations.                          eGFR's persistently                          <90 mL/min signify                          possible Chronic Kidney Disease.  . WBC 06/29/2012 11.3* 4.0 - 10.5 K/uL Final  . RBC 06/29/2012 3.72* 3.87 - 5.11 MIL/uL Final  . Hemoglobin 06/29/2012 11.3* 12.0 - 15.0 g/dL  Final  . HCT 95/62/1308 34.6* 36.0 - 46.0 % Final  . MCV 06/29/2012 93.0  78.0 - 100.0 fL Final  . MCH 06/29/2012 30.4  26.0 - 34.0 pg Final  . MCHC 06/29/2012 32.7  30.0 - 36.0 g/dL Final  . RDW 65/78/4696 13.4  11.5 - 15.5 % Final  . Platelets 06/29/2012 178  150 - 400 K/uL Final  . Sodium 06/29/2012 137  135 - 145 mEq/L Final   DELTA CHECK NOTED  . Potassium 06/29/2012 3.9  3.5 - 5.1 mEq/L Final  . Chloride 06/29/2012 101  96 - 112 mEq/L Final  . CO2 06/29/2012 31  19 - 32 mEq/L Final  . Glucose, Bld 06/29/2012 183* 70 - 99 mg/dL Final  . BUN 29/52/8413 12  6 - 23 mg/dL Final  . Creatinine, Ser 06/29/2012 0.49* 0.50 - 1.10 mg/dL Final  . Calcium 24/40/1027 9.3  8.4 - 10.5 mg/dL Final  . GFR calc non Af Amer 06/29/2012 86* >90 mL/min Final  . GFR calc Af Amer 06/29/2012 >90  >90 mL/min Final   Comment:                                 The eGFR has been calculated                          using the CKD EPI equation.                          This calculation has not been                          validated in all clinical  situations.                          eGFR's persistently                          <90 mL/min signify                          possible Chronic Kidney Disease.  . WBC 06/30/2012 9.4  4.0 - 10.5 K/uL Final  . RBC 06/30/2012 3.25* 3.87 - 5.11 MIL/uL Final  . Hemoglobin 06/30/2012 10.3* 12.0 - 15.0 g/dL Final  . HCT 40/98/1191 30.5* 36.0 - 46.0 % Final  . MCV 06/30/2012 93.8  78.0 - 100.0 fL Final  . MCH 06/30/2012 31.7  26.0 - 34.0 pg Final  . MCHC 06/30/2012 33.8  30.0 - 36.0 g/dL Final  . RDW 47/82/9562 14.0  11.5 - 15.5 % Final  . Platelets 06/30/2012 176  150 - 400 K/uL Final  Hospital Outpatient Visit on 06/21/2012  Component Date Value Range Status  . aPTT 06/21/2012 32  24 - 37 seconds Final  . WBC 06/21/2012 5.9  4.0 - 10.5 K/uL Final  . RBC 06/21/2012 4.59  3.87 - 5.11 MIL/uL Final  . Hemoglobin 06/21/2012 14.2  12.0 - 15.0 g/dL  Final  . HCT 13/10/6576 43.5  36.0 - 46.0 % Final  . MCV 06/21/2012 94.8  78.0 - 100.0 fL Final  . MCH 06/21/2012 30.9  26.0 - 34.0 pg Final  . MCHC 06/21/2012 32.6  30.0 - 36.0 g/dL Final  . RDW 46/96/2952 13.4  11.5 - 15.5 % Final  . Platelets 06/21/2012 227  150 - 400 K/uL Final  . Sodium 06/21/2012 139  135 - 145 mEq/L Final  . Potassium 06/21/2012 4.2  3.5 - 5.1 mEq/L Final  . Chloride 06/21/2012 101  96 - 112 mEq/L Final  . CO2 06/21/2012 33* 19 - 32 mEq/L Final  . Glucose, Bld 06/21/2012 61* 70 - 99 mg/dL Final  . BUN 84/13/2440 18  6 - 23 mg/dL Final  . Creatinine, Ser 06/21/2012 0.71  0.50 - 1.10 mg/dL Final  . Calcium 01/17/2535 9.2  8.4 - 10.5 mg/dL Final  . Total Protein 06/21/2012 6.5  6.0 - 8.3 g/dL Final  . Albumin 64/40/3474 3.4* 3.5 - 5.2 g/dL Final  . AST 25/95/6387 25  0 - 37 U/L Final  . ALT 06/21/2012 19  0 - 35 U/L Final  . Alkaline Phosphatase 06/21/2012 106  39 - 117 U/L Final  . Total Bilirubin 06/21/2012 0.3  0.3 - 1.2 mg/dL Final  . GFR calc non Af Amer 06/21/2012 76* >90 mL/min Final  . GFR calc Af Amer 06/21/2012 89* >90 mL/min Final   Comment:                                 The eGFR has been calculated                          using the CKD EPI equation.                          This calculation has not been  validated in all clinical                          situations.                          eGFR's persistently                          <90 mL/min signify                          possible Chronic Kidney Disease.  Marland Kitchen Prothrombin Time 06/21/2012 13.2  11.6 - 15.2 seconds Final  . INR 06/21/2012 1.01  0.00 - 1.49 Final  . Color, Urine 06/21/2012 YELLOW  YELLOW Final  . APPearance 06/21/2012 CLEAR  CLEAR Final  . Specific Gravity, Urine 06/21/2012 1.015  1.005 - 1.030 Final  . pH 06/21/2012 6.0  5.0 - 8.0 Final  . Glucose, UA 06/21/2012 NEGATIVE  NEGATIVE mg/dL Final  . Hgb urine dipstick 06/21/2012 NEGATIVE  NEGATIVE Final    . Bilirubin Urine 06/21/2012 NEGATIVE  NEGATIVE Final  . Ketones, ur 06/21/2012 NEGATIVE  NEGATIVE mg/dL Final  . Protein, ur 16/12/9602 NEGATIVE  NEGATIVE mg/dL Final  . Urobilinogen, UA 06/21/2012 0.2  0.0 - 1.0 mg/dL Final  . Nitrite 54/11/8117 NEGATIVE  NEGATIVE Final  . Leukocytes, UA 06/21/2012 TRACE* NEGATIVE Final  . MRSA, PCR 06/21/2012 NEGATIVE  NEGATIVE Final  . Staphylococcus aureus 06/21/2012 NEGATIVE  NEGATIVE Final   Comment:                                 The Xpert SA Assay (FDA                          approved for NASAL specimens                          in patients over 7 years of age),                          is one component of                          a comprehensive surveillance                          program.  Test performance has                          been validated by [employer] for patients greater                          than or equal to 53 year old.                          It is not intended  to diagnose infection nor to                          guide or monitor treatment.  . Squamous Epithelial / LPF 06/21/2012 RARE  RARE Final  . WBC, UA 06/21/2012 0-2  <3 WBC/hpf Final  . Bacteria, UA 06/21/2012 FEW* RARE Final     X-Rays:Dg Chest 2 View  07/08/2012  *RADIOLOGY REPORT*  Clinical Data: Cough, congestion.  CHEST - 2 VIEW  Comparison: 12/14/2011  Findings: Small bilateral pleural effusions.  Heart is borderline in size.  No confluent airspace opacities.  No edema.  No acute bony abnormality.  IMPRESSION: Small bilateral pleural effusions.   Original Report Authenticated By: Charlett Nose, M.D.    Dg Knee Complete 4 Views Left  07/08/2012  *RADIOLOGY REPORT*  Clinical Data: Knee pain.  LEFT KNEE - COMPLETE 4+ VIEW  Comparison: None.  Findings: Small left knee pleural effusion. Diffuse soft tissue swelling.  Prior left knee replacement.  No hardware complicating feature.  No fracture, subluxation or  dislocation.  IMPRESSION: Soft tissue swelling and small joint effusion.  Prior left knee replacement.  No hardware or bony acute findings.   Original Report Authenticated By: Charlett Nose, M.D.     EKG: Orders placed in visit on 06/10/12  . EKG 12-LEAD     Hospital Course: [name] [name] is a 79 y.o. who was admitted to [facility].  Patient 10 days s/p right TKR with 2 day hx of erythema and swelling. Patient transported from nsg home to ER for further eval and treatment. They were given PO and IV analgesics for pain control following their surgery.  They were given antibiotics of  Anti-infectives   Start     Dose/Rate Route Frequency Ordered Stop   07/09/12 0000  cephALEXin (KEFLEX) 250 MG capsule     250 mg Oral 3 times daily 07/09/12 0835     07/08/12 2200  ceFAZolin (ANCEF) IVPB 1 g/50 mL premix  Status:  Discontinued     1 g 100 mL/hr over 30 Minutes Intravenous 3 times per day 07/08/12 2114 07/09/12 1658   07/08/12 1530  ceFAZolin (ANCEF) IVPB 1 g/50 mL premix     1 g 100 mL/hr over 30 Minutes Intravenous  Once 07/08/12 1528 07/08/12 1642   07/08/12 1530  vancomycin (VANCOCIN) IVPB 1000 mg/200 mL premix     1,000 mg 200 mL/hr over 60 Minutes Intravenous  Once 07/08/12 1528 07/08/12 1759     PT was ordered for total joint protocol.  Discharge planning consulted to help with postop disposition and equipment needs.  Patient had a decent night on the evening of admission.  They started to get up OOB with therapy on day one. Dressing was changed and the incision was healing fine.  Bending knee well without pain. no drainage or peri-incisional erythema. Mild redness distally lower leg Patient was seen in rounds by Dr. Lequita Halt and was then discharged home.  Will do 5 days of Keflex to complete treatment of the cellulitis. Will have HHPT arranged. Has F/U 4/24   Discharge Medications: Prior to Admission medications   Medication Sig Start Date End Date Taking? Authorizing Provider    acetaminophen (TYLENOL) 325 MG tablet Take 650 mg by mouth every 6 (six) hours as needed for pain.   Yes Historical Provider, MD  bisacodyl (DULCOLAX) 10 MG suppository Place 10 mg rectally daily as needed for constipation.   Yes Historical Provider,  MD  diphenhydrAMINE (BENADRYL) 12.5 MG/5ML elixir Take 5-10 mLs (12.5-25 mg total) by mouth every 4 (four) hours as needed for itching. 06/30/12  Yes Arianis Bowditch Julien Girt, PA-C  docusate sodium (COLACE) 100 MG capsule Take 100 mg by mouth 2 (two) times daily.   Yes Historical Provider, MD  guaiFENesin (MUCINEX) 600 MG 12 hr tablet Take 600 mg by mouth 2 (two) times daily.   Yes Historical Provider, MD  methocarbamol (ROBAXIN) 500 MG tablet Take 500 mg by mouth every 6 (six) hours as needed (for muscle spasms).   Yes Historical Provider, MD  ondansetron (ZOFRAN) 4 MG tablet Take 1 tablet (4 mg total) by mouth every 6 (six) hours as needed for nausea. 06/30/12  Yes Kialee Kham Julien Girt, PA-C  PARoxetine (PAXIL) 10 MG tablet Take 10 mg by mouth daily.    Yes Historical Provider, MD  polyethylene glycol (MIRALAX / GLYCOLAX) packet Take 17 g by mouth daily.   Yes Historical Provider, MD  prednisoLONE acetate (PRED FORTE) 1 % ophthalmic suspension Place 1 drop into both eyes 2 (two) times daily.    Yes Historical Provider, MD  rivaroxaban (XARELTO) 10 MG TABS tablet Take 10 mg by mouth daily. 2.5 week course of therapy started 06/30/12.   Yes Historical Provider, MD  sodium chloride (MURO 128) 2 % ophthalmic solution Place 1 drop into both eyes daily as needed (dry eyes).   Yes Historical Provider, MD  cephALEXin (KEFLEX) 250 MG capsule Take 1 capsule (250 mg total) by mouth 3 (three) times daily. 07/09/12   Loanne Drilling, MD  diltiazem (CARDIZEM CD) 120 MG 24 hr capsule Take 1 capsule (120 mg total) by mouth daily. 07/21/12   Tonny Bollman, MD  HYDROcodone-acetaminophen (NORCO/VICODIN) 5-325 MG per tablet Take 1-2 tablets by mouth every 4 (four) hours as needed.  07/09/12   Loanne Drilling, MD   Diet: Regular diet  Activity:WBAT  Follow-up: 4/24 Disposition - Home Discharged Condition: stable    Medication List    STOP taking these medications       doxycycline 100 MG tablet  Commonly known as:  VIBRA-TABS     HYDROmorphone 2 MG tablet  Commonly known as:  DILAUDID      TAKE these medications       acetaminophen 325 MG tablet  Commonly known as:  TYLENOL  Take 650 mg by mouth every 6 (six) hours as needed for pain.     bisacodyl 10 MG suppository  Commonly known as:  DULCOLAX  Place 10 mg rectally daily as needed for constipation.     cephALEXin 250 MG capsule  Commonly known as:  KEFLEX  Take 1 capsule (250 mg total) by mouth 3 (three) times daily.     diphenhydrAMINE 12.5 MG/5ML elixir  Commonly known as:  BENADRYL  Take 5-10 mLs (12.5-25 mg total) by mouth every 4 (four) hours as needed for itching.     docusate sodium 100 MG capsule  Commonly known as:  COLACE  Take 100 mg by mouth 2 (two) times daily.     guaiFENesin 600 MG 12 hr tablet  Commonly known as:  MUCINEX  Take 600 mg by mouth 2 (two) times daily.     HYDROcodone-acetaminophen 5-325 MG per tablet  Commonly known as:  NORCO/VICODIN  Take 1-2 tablets by mouth every 4 (four) hours as needed.     methocarbamol 500 MG tablet  Commonly known as:  ROBAXIN  Take 500 mg by mouth every 6 (six) hours as  needed (for muscle spasms).     ondansetron 4 MG tablet  Commonly known as:  ZOFRAN  Take 1 tablet (4 mg total) by mouth every 6 (six) hours as needed for nausea.     PARoxetine 10 MG tablet  Commonly known as:  PAXIL  Take 10 mg by mouth daily.     polyethylene glycol packet  Commonly known as:  MIRALAX / GLYCOLAX  Take 17 g by mouth daily.     prednisoLONE acetate 1 % ophthalmic suspension  Commonly known as:  PRED FORTE  Place 1 drop into both eyes 2 (two) times daily.     rivaroxaban 10 MG Tabs tablet  Commonly known as:  XARELTO  Take 10 mg by  mouth daily. 2.5 week course of therapy started 06/30/12.     sodium chloride 2 % ophthalmic solution  Commonly known as:  MURO 128  Place 1 drop into both eyes daily as needed (dry eyes).           Follow-up Information   Follow up with Loanne Drilling, MD. Schedule an appointment as soon as possible for a visit on 07/14/2012. (Keep your scheduled appointment on 4/24)    Contact information:   [address] [place] [address] [phone]       Follow up with [facility]. (home health PT and equipment)    Contact information:   [phone]      Signed: Patrica Duel 07/24/2012, 10:51 PM

## 2012-07-27 ENCOUNTER — Telehealth: Payer: Self-pay | Admitting: Cardiovascular Disease

## 2012-07-27 DIAGNOSIS — R002 Palpitations: Secondary | ICD-10-CM

## 2012-07-27 NOTE — Telephone Encounter (Signed)
New Prob     Calling in inquiring about a heart monitor for mother. Would like to speak to nurse.

## 2012-07-27 NOTE — Telephone Encounter (Signed)
Left message on machine for pt to contact the office.   

## 2012-07-28 NOTE — Telephone Encounter (Signed)
Follow up   Pt's daughter returning your call.

## 2012-07-28 NOTE — Telephone Encounter (Signed)
I spoke with the pt's daughter and the pt is continuing to have problems with palpitations.  The pt did try the Cardizem CD but she felt this made her symptoms worse and she stopped the medication.  The pt went back to taking Metoprolol Tartrate once a day at lunch time but this was causing visual changes.  The pt is now just using Metoprolol as needed for episodes. The pt's daughter would like to know if the pt needs to try a different medication, wear a heart monitor or come into the office for an appointment. I will forward this message to Dr Excell Seltzer for further recommendations.

## 2012-07-29 NOTE — Telephone Encounter (Signed)
I spoke with the pt's daughter and holter monitor appointment scheduled on 08/02/12 at 4:00.

## 2012-07-29 NOTE — Telephone Encounter (Signed)
Continue metoprolol as needed for symptoms. Check a 48-hour Holter monitor.

## 2012-08-02 DIAGNOSIS — R002 Palpitations: Secondary | ICD-10-CM

## 2012-08-08 ENCOUNTER — Telehealth: Payer: Self-pay | Admitting: Cardiovascular Disease

## 2012-08-08 NOTE — Telephone Encounter (Signed)
New problem    1. Holter monitor results .   2. Patient is not sleeping is it safe to take an over the counter medication or magnesium

## 2012-08-08 NOTE — Telephone Encounter (Signed)
Per Delice Bison, holter has not been run yet, it may be tomorrow before it's processed. I have notified the patient it is in process and once completed, this will be forwarded to Dr. Excell Seltzer for review. Lauren will call her with results afterward. I have also recommended she may try benadryl for sleep;.

## 2012-08-10 ENCOUNTER — Telehealth: Payer: Self-pay | Admitting: Cardiovascular Disease

## 2012-08-10 NOTE — Telephone Encounter (Signed)
I spoke with the pt's daughter and made her aware of monitor results. Monitor shows Sinus Rhythm with PVC's and runs PSVT. Intolerant to multiple meds. Recommend EP referral. Pt scheduled to see Dr Graciela Husbands on 08/24/12 at 2:30.

## 2012-08-10 NOTE — Telephone Encounter (Signed)
Follow Up      Following up on monitor results. Please call.

## 2012-08-11 ENCOUNTER — Telehealth: Payer: Self-pay | Admitting: Cardiovascular Disease

## 2012-08-11 NOTE — Telephone Encounter (Signed)
New problem    pts daughter has a question about a prescription

## 2012-08-11 NOTE — Telephone Encounter (Signed)
I spoke with the pt's daughter and at this time the pt is going to start taking Metoprolol Tartrate 25mg  twice a day to help control rapid pulse. The pt is scheduled for EP consult 08/24/12.

## 2012-08-24 ENCOUNTER — Encounter: Payer: Self-pay | Admitting: Internal Medicine

## 2012-08-24 ENCOUNTER — Ambulatory Visit (INDEPENDENT_AMBULATORY_CARE_PROVIDER_SITE_OTHER): Payer: Medicare Other | Admitting: Internal Medicine

## 2012-08-24 VITALS — BP 149/64 | HR 72 | Ht 62.5 in | Wt 171.0 lb

## 2012-08-24 DIAGNOSIS — I471 Supraventricular tachycardia, unspecified: Secondary | ICD-10-CM

## 2012-08-24 DIAGNOSIS — R1900 Intra-abdominal and pelvic swelling, mass and lump, unspecified site: Secondary | ICD-10-CM | POA: Insufficient documentation

## 2012-08-24 DIAGNOSIS — I498 Other specified cardiac arrhythmias: Secondary | ICD-10-CM

## 2012-08-24 MED ORDER — NEBIVOLOL HCL 5 MG PO TABS: 5.0000 mg | ORAL_TABLET | Freq: Every day | ORAL | Status: DC

## 2012-08-24 MED ORDER — VERAPAMIL HCL ER 120 MG PO CP24: 120.0000 mg | ORAL_CAPSULE | Freq: Every day | ORAL | Status: DC

## 2012-08-24 MED ORDER — ATENOLOL 50 MG PO TABS: 50.0000 mg | ORAL_TABLET | Freq: Every day | ORAL | Status: DC

## 2012-08-24 NOTE — Assessment & Plan Note (Signed)
No family history of aneurysm but with abdominal pulse patient will undertake ultrasound

## 2012-08-24 NOTE — Patient Instructions (Addendum)
Your physician has requested that you have an abdominal aorta duplex. During this test, an ultrasound is used to evaluate the aorta. Allow 30 minutes for this exam. Do not eat after midnight the day before and avoid carbonated beverages  START: Atenolol 50mg  daily              Verapamil 120mg  daily              Bystolic 5mg  daily   Your physician has requested that you have a lexiscan myoview. For further information please visit [email or web address] Please follow instruction sheet, as given.  Your physician recommends that you schedule a follow-up appointment in: 6 weeks with Dr. Graciela Husbands.

## 2012-08-24 NOTE — Assessment & Plan Note (Signed)
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 Treatment with metoprolol has been significantly beneficial in terms of reduction in symptoms frequency; however, the medication has not been tolerated because of excess salivation and sleep disturbance for which are well described with metoprolol. Previously she had not tolerated diltiazem.  We discussed treatment options including Valsalva and vagal effects, when necessary medications daily medications and catheter ablation. For right now we will pursue daily medications and I have given her prescriptions for atenolol 50, verapamil 120, and Bystolic 5. We hope to avoid significant CNS affects. We'll plan to regroup in 6-8 weeks and discuss further options including catheter ablation which we reviewed briefly today

## 2012-08-24 NOTE — Progress Notes (Signed)
ELECTROPHYSIOLOGY CONSULT NOTE  Patient ID: [name] [name], MRN: [id number], DOB/AGE: 1924-04-13 79 y.o. Admit date: (Not on file) Date of Consult: 08/24/2012  Primary Physician: Hollice Espy, MD Primary Cardiologist:  [facility]  Chief Complaint:  SVT   HPI [name] [name] is a 79 y.o. female  Seen at the request of Dr. Tonny Bollman because of SVT which is adenosine sensitive.  She has had spells for about 12 years which have been infrequent until recently where they have now become almost incessant particularly for recent knee surgery.  Abrupt in onset and offset and associated with severe palpitations and back pain. Her diuretic positive and frog positive. Event recorder demonstrated numerous episodes of onset associated with PR prolongation.   Cardiac evaluation has included echocardiogram 10/13 demonstrating normal LV function with mild LAE  She has no known heart disease. She does however describe epigastric discomfort which is primarily nonexertional but can be triggered by these episodes.     She has had much fewer episodes since having started on metoprolol. However, this has been associated with excess salivation as well as tmitigate disturbance of sleep patterns  Past Medical History  Diagnosis Date  . Arthritis   . Blind left eye   . Dysrhythmia   . Varicose veins     both legs and left fooot at arch  . Complication of anesthesia     sensitive to meds, bp can fall      Surgical History:  Past Surgical History  Procedure Laterality Date  . Cholecystectomy    . Tonsillectomy    . Tonsillectomy    . Corneal transplant  right 2009, left 2009    both eyes, 3 in left eye, 1 in right eye  . Cataract surgery  2005    both eyes  . Abdominal hysterectomy   sept 1985    ovaries removed  . Total knee arthroplasty Left 06/27/2012    Procedure: LEFT TOTAL KNEE ARTHROPLASTY;  Surgeon: Loanne Drilling, MD;  Location: [facility];  Service: Orthopedics;  Laterality: Left;     Home  Meds: Prior to Admission medications   Medication Sig Start Date End Date Taking? Authorizing Provider  aspirin 81 MG tablet Take 81 mg by mouth daily.   Yes Historical Provider, MD  metoprolol tartrate (LOPRESSOR) 25 MG tablet Take 1 tablet (25 mg total) by mouth 2 (two) times daily as needed. 07/29/12  Yes Tonny Bollman, MD  PARoxetine (PAXIL) 10 MG tablet Take 10 mg by mouth daily.    Yes Historical Provider, MD  prednisoLONE acetate (PRED FORTE) 1 % ophthalmic suspension Place 1 drop into both eyes 2 (two) times daily.    Yes Historical Provider, MD  sodium chloride (MURO 128) 2 % ophthalmic solution Place 1 drop into both eyes daily as needed (dry eyes).   Yes Historical Provider, MD     Allergies:  Allergies  Allergen Reactions  . Ultram (Tramadol) Hives  . Buprenex (Buprenorphine Hcl) Other (See Comments)    Heart raced  . Levofloxacin Other (See Comments)    levaquin unknown reaction  . Percocet (Oxycodone-Acetaminophen) Itching    "felt drained"  . Sulfa Antibiotics Other (See Comments)    unknown  . Tape     Adhesive tape causes skin to pull off  . Zetia (Ezetimibe) Other (See Comments)    unknown    History   Social History  . Marital Status: Widowed    Spouse Name: N/A    Number of Children: N/A  .  Years of Education: N/A   Occupational History  . Not on file.   Social History Main Topics  . Smoking status: Never Smoker   . Smokeless tobacco: Never Used  . Alcohol Use: No  . Drug Use: No  . Sexually Active: No   Other Topics Concern  . Not on file   Social History Narrative  . No narrative on file     No family history on file.   ROS:  Please see the history of present illness.     All other systems reviewed and negative.    Physical Exam:   Blood pressure 149/64, pulse 72, height 5' 2.5" (1.588 m), weight 171 lb (77.565 kg). General: Well developed, well nourished female in no acute distress. Head: Normocephalic, atraumatic, sclera non-icteric,  no xanthomas, nares are without discharge. EENT: normal Lymph Nodes:  none Back: without scoliosis/kyphosi  no CVA tendersness Neck: Negative for carotid bruits. JVD not elevated. Lungs: Clear bilaterally to auscultation without wheezes, rales, or rhonchi. Breathing is unlabored. Heart: RRR with S1 S2. N o *murmur , rubs, or gallops appreciated. Abdomen: Soft, non-tender, non-distended with normoactive bowel sounds. No hepatomegaly. No rebound/guarding. Pulsating midline mass  Msk:  Strength and tone appear normal for age. Extremities: No clubbing or cyanosis. No  edema.  Distal pedal pulses are 2+ and equal bilaterally. Skin: Warm and Dry Neuro: Alert and oriented X 3. CN III-XII intact Grossly normal sensory and motor function . Psych:  Responds to questions appropriately with a normal affect.      Labs: Cardiac Enzymes No results found for this basename: CKTOTAL, CKMB, TROPONINI,  in the last 72 hours CBC Lab Results  Component Value Date   WBC 8.8 07/08/2012   HGB 10.4* 07/08/2012   HCT 32.1* 07/08/2012   MCV 94.4 07/08/2012   PLT 332 07/08/2012   Radiology/Studies:  No results found.  EKG:       Sherryl Manges

## 2012-08-30 ENCOUNTER — Encounter (INDEPENDENT_AMBULATORY_CARE_PROVIDER_SITE_OTHER): Payer: Medicare Other

## 2012-08-30 DIAGNOSIS — R1900 Intra-abdominal and pelvic swelling, mass and lump, unspecified site: Secondary | ICD-10-CM

## 2012-08-30 DIAGNOSIS — I700 Atherosclerosis of aorta: Secondary | ICD-10-CM

## 2012-09-01 ENCOUNTER — Ambulatory Visit (HOSPITAL_COMMUNITY): Payer: Medicare Other | Attending: Cardiology | Admitting: Radiology

## 2012-09-01 VITALS — BP 166/60 | HR 64 | Ht 62.5 in | Wt 167.0 lb

## 2012-09-01 DIAGNOSIS — R079 Chest pain, unspecified: Secondary | ICD-10-CM

## 2012-09-01 DIAGNOSIS — R000 Tachycardia, unspecified: Secondary | ICD-10-CM | POA: Insufficient documentation

## 2012-09-01 DIAGNOSIS — R002 Palpitations: Secondary | ICD-10-CM | POA: Insufficient documentation

## 2012-09-01 DIAGNOSIS — R5381 Other malaise: Secondary | ICD-10-CM | POA: Insufficient documentation

## 2012-09-01 DIAGNOSIS — I471 Supraventricular tachycardia: Secondary | ICD-10-CM

## 2012-09-01 DIAGNOSIS — R0602 Shortness of breath: Secondary | ICD-10-CM

## 2012-09-01 DIAGNOSIS — Z8249 Family history of ischemic heart disease and other diseases of the circulatory system: Secondary | ICD-10-CM | POA: Insufficient documentation

## 2012-09-01 DIAGNOSIS — R0789 Other chest pain: Secondary | ICD-10-CM | POA: Insufficient documentation

## 2012-09-01 DIAGNOSIS — R0609 Other forms of dyspnea: Secondary | ICD-10-CM | POA: Insufficient documentation

## 2012-09-01 DIAGNOSIS — R0989 Other specified symptoms and signs involving the circulatory and respiratory systems: Secondary | ICD-10-CM | POA: Insufficient documentation

## 2012-09-01 MED ORDER — REGADENOSON 0.4 MG/5ML IV SOLN
0.4000 mg | Freq: Once | INTRAVENOUS | Status: AC
  Administered 2012-09-01: 0.4 mg via INTRAVENOUS

## 2012-09-01 MED ORDER — TECHNETIUM TC 99M SESTAMIBI GENERIC - CARDIOLITE
11.0000 | Freq: Once | INTRAVENOUS | Status: AC | PRN
  Administered 2012-09-01: 11 via INTRAVENOUS

## 2012-09-01 MED ORDER — TECHNETIUM TC 99M SESTAMIBI GENERIC - CARDIOLITE
33.0000 | Freq: Once | INTRAVENOUS | Status: AC | PRN
  Administered 2012-09-01: 33 via INTRAVENOUS

## 2012-09-01 NOTE — Progress Notes (Signed)
[facility] SITE 3 NUCLEAR MED [address] [place], [place] [address] [phone]    Cardiology Nuclear Med Study  [name] [name] is a 79 y.o. female     MRN : [id number]     DOB: 12-18-1924  Procedure Date: 09/01/2012  Nuclear Med Background Indication for Stress Test:  Evaluation for Ischemia History:  ~4 yrs ago GXT:ok per patient; '13 Echo:EF=65%; h/o SVT Cardiac Risk Factors: Family History - CAD  Symptoms:  Chest Pain (last episode of chest discomfort was about 2-weeks ago), DOE, Fatigue, Palpitations and Rapid HR   Nuclear Pre-Procedure Caffeine/Decaff Intake:  None NPO After: 8:00pm   Lungs:  Clear. O2 Sat: 98% on room air. IV 0.9% NS with Angio Cath:  20g  IV Site: R Antecubital  IV Started by:  Stanton Kidney, EMT-P  Chest Size (in):  40 Cup Size: C  Height: 5' 2.5" (1.588 m)  Weight:  167 lb (75.751 kg)  BMI:  Body mass index is 30.04 kg/(m^2). Tech Comments:  Med's were held this am, per patient.    Nuclear Med Study 1 or 2 day study: 1 day  Stress Test Type:  Eugenie Birks  Reading MD: Marca Ancona, MD  Order Authorizing Provider:  Berton Mount, MD  Resting Radionuclide: Technetium 45m Sestamibi  Resting Radionuclide Dose: 11.0 mCi   Stress Radionuclide:  Technetium 30m Sestamibi  Stress Radionuclide Dose: 33.0 mCi           Stress Protocol Rest HR: 64 Stress HR: 90  Rest BP: 166/60 Stress BP: 156/52  Exercise Time (min): n/a METS: n/a   Predicted Max HR: 135 bpm % Max HR: 66.67 bpm Rate Pressure Product: 21308   Dose of Adenosine (mg):  n/a Dose of Lexiscan: 0.4 mg  Dose of Atropine (mg): n/a Dose of Dobutamine: n/a mcg/kg/min (at max HR)  Stress Test Technologist: Smiley Houseman, CMA-N  Nuclear Technologist:  Doyne Keel, CNMT     Rest Procedure:  Myocardial perfusion imaging was performed at rest 45 minutes following the intravenous administration of Technetium 70m Sestamibi.  Rest ECG: NSR - Normal EKG  Stress Procedure:  The patient  received IV Lexiscan 0.4 mg over 15-seconds.  Technetium 22m Sestamibi injected at 30-seconds.  Quantitative spect images were obtained after a 45 minute delay.  Stress ECG: No significant ST segment change suggestive of ischemia.  QPS Raw Data Images:  Normal; no motion artifact; normal heart/lung ratio. Stress Images:  Normal homogeneous uptake in all areas of the myocardium. Rest Images:  Normal homogeneous uptake in all areas of the myocardium. Subtraction (SDS):  There is no evidence of scar or ischemia. Transient Ischemic Dilatation (Normal <1.22):  1.12 Lung/Heart Ratio (Normal <0.45):  0.26  Quantitative Gated Spect Images QGS EDV:  86 ml QGS ESV:  32 ml  Impression Exercise Capacity:  Lexiscan with no exercise. BP Response:  Normal blood pressure response. Clinical Symptoms:  Short of breath, headache.  ECG Impression:  No significant ST segment change suggestive of ischemia. Comparison with Prior Nuclear Study: No previous nuclear study performed  Overall Impression:  Normal stress nuclear study.  LV Ejection Fraction: 63%.  LV Wall Motion:  NL LV Function; NL Wall Motion  Marca Ancona 09/01/2012

## 2012-09-06 ENCOUNTER — Telehealth: Payer: Self-pay | Admitting: Internal Medicine

## 2012-09-06 NOTE — Telephone Encounter (Signed)
New Problem  Pt is calling regarding he test results from the procedure she had last week.

## 2012-09-06 NOTE — Telephone Encounter (Signed)
Spoke with pt, aware of nuclear results 

## 2012-09-06 NOTE — Telephone Encounter (Signed)
Left message for pt to call.

## 2012-10-03 ENCOUNTER — Ambulatory Visit (INDEPENDENT_AMBULATORY_CARE_PROVIDER_SITE_OTHER): Payer: Medicare Other | Admitting: Internal Medicine

## 2012-10-03 ENCOUNTER — Encounter: Payer: Self-pay | Admitting: Internal Medicine

## 2012-10-03 VITALS — BP 138/56 | HR 92 | Ht 62.0 in | Wt 168.0 lb

## 2012-10-03 DIAGNOSIS — I498 Other specified cardiac arrhythmias: Secondary | ICD-10-CM

## 2012-10-03 DIAGNOSIS — I471 Supraventricular tachycardia: Secondary | ICD-10-CM

## 2012-10-03 DIAGNOSIS — G479 Sleep disorder, unspecified: Secondary | ICD-10-CM

## 2012-10-03 NOTE — Progress Notes (Signed)
Patient Care Team: Hollice Espy, MD as PCP - General (Family Medicine)   HPI  [name] [name] is a 79 y.o. female Seen in followup for SVT which is most consistent with AV nodal reentry. She was started on metoprolol and largely attenuated her symptoms as well as associated with side effects of salivation and sleep disturbance.At our last visit,she was inclined to pursue medical therapy and we gave her atenolol, verapamil, and bystolic  She continues to struggle with sleep disturbance; she is not had any subsequent palpitations of note. She has been on the verapamil just 2 days.  Past Medical History  Diagnosis Date  . Arthritis   . Blind left eye   . Dysrhythmia   . Varicose veins     both legs and left fooot at arch  . Complication of anesthesia     sensitive to meds, bp can fall    Past Surgical History  Procedure Laterality Date  . Cholecystectomy    . Tonsillectomy    . Tonsillectomy    . Corneal transplant  right 2009, left 2009    both eyes, 3 in left eye, 1 in right eye  . Cataract surgery  2005    both eyes  . Abdominal hysterectomy   sept 1985    ovaries removed  . Total knee arthroplasty Left 06/27/2012    Procedure: LEFT TOTAL KNEE ARTHROPLASTY;  Surgeon: Loanne Drilling, MD;  Location: [facility];  Service: Orthopedics;  Laterality: Left;    Current Outpatient Prescriptions  Medication Sig Dispense Refill  . aspirin 81 MG tablet Take 81 mg by mouth daily.      Marland Kitchen atenolol (TENORMIN) 50 MG tablet Take 1 tablet (50 mg total) by mouth daily.  30 tablet  2  . nebivolol (BYSTOLIC) 5 MG tablet Take 1 tablet (5 mg total) by mouth daily.  30 tablet  2  . PARoxetine (PAXIL) 10 MG tablet Take 10 mg by mouth daily.       . prednisoLONE acetate (PRED FORTE) 1 % ophthalmic suspension Place 1 drop into both eyes 2 (two) times daily.       . sodium chloride (MURO 128) 2 % ophthalmic solution Place 1 drop into both eyes daily as needed (dry eyes).      . verapamil (VERELAN PM) 120  MG 24 hr capsule Take 1 capsule (120 mg total) by mouth at bedtime.  30 capsule  2   No current facility-administered medications for this visit.    Allergies  Allergen Reactions  . Ultram (Tramadol) Hives  . Buprenex (Buprenorphine Hcl) Other (See Comments)    Heart raced  . Levofloxacin Other (See Comments)    levaquin unknown reaction  . Percocet (Oxycodone-Acetaminophen) Itching    "felt drained"  . Sulfa Antibiotics Other (See Comments)    unknown  . Tape     Adhesive tape causes skin to pull off  . Zetia (Ezetimibe) Other (See Comments)    unknown    Review of Systems negative except from HPI and PMH  Physical Exam BP 138/56  Pulse 92  Ht 5\' 2"  (1.575 m)  Wt 168 lb (76.204 kg)  BMI 30.72 kg/m2 Well developed and nourished in no acute distress HENT normal Neck supple with JVP-flat Clear Regular rate and rhythm, no murmurs or gallops Abd-soft with active BS No Clubbing cyanosis edema Skin-warm and dry A & Oriented  Grossly normal sensory and motor function  A a a ECG demonstrates sinus rhythm at 92  Intervals 16/09/37 otherwise normal  Assessment and  Plan

## 2012-10-03 NOTE — Patient Instructions (Addendum)
Your physician wants you to follow-up in: 3 MONTHS WITH DR Logan Bores will receive a reminder letter in the mail two months in advance. If you don't receive a letter, please call our office to schedule the follow-up appointment.   CALL AND LET us KNOW HOW YOU ARE DOING ON VERAPAMIL

## 2012-10-03 NOTE — Assessment & Plan Note (Signed)
In no recurrent SVT. We again discussed the possibility of catheter ablation as an alternative to medications.for now she will continue his medications.  We reviewed the potential that the verapamil is contributing to her insomnia. Much to my surprise, I see that is associated with 0.1-1.4% in various studies. For right now though I think the sleep disturbance is more likely related to residual effects of beta blockers have asked her to continue on a calcium blocker. She is to let us know next week however that she is feeling. In the event that she continues with sleep disturbance, I would switch her to Cardizem

## 2012-10-03 NOTE — Assessment & Plan Note (Signed)
This is chronic; however temporally, it is significantly worse in the context of beta blocker therapy. Hopefully with calcium blocker will not cause this problem

## 2012-10-05 ENCOUNTER — Telehealth: Payer: Self-pay | Admitting: Internal Medicine

## 2012-10-05 NOTE — Telephone Encounter (Signed)
Spoke with pt, she called to report her bp today at her PCP was 92/53 on the verapamil. She also has had some dizziness. Aware dr Graciela Husbands is not here today. Will discuss with him tomorrow and call her back. Pt agreed with this plan.

## 2012-10-05 NOTE — Telephone Encounter (Signed)
New Prob     Pt would like to speak to nurse regarding pre op instructions for her next procedure. Please call.

## 2012-10-06 NOTE — Telephone Encounter (Signed)
Discussed with dr Graciela Husbands, Spoke with pt, she is not tolerating the verapamil. The next option for the pt is to try a beta blocker again or ablation. She still has the script for bystolic and still has a couple atenolol. She is not really wanting to do the ablation right now. She will try several things and call me back.

## 2012-10-06 NOTE — Telephone Encounter (Signed)
Follow Up     Pt calling in following up on issue regarding her BP. Please call.

## 2012-10-07 ENCOUNTER — Telehealth: Payer: Self-pay | Admitting: Internal Medicine

## 2012-10-07 NOTE — Telephone Encounter (Signed)
Spoke with pt, questions regarding medication answered.

## 2012-10-07 NOTE — Telephone Encounter (Signed)
New problem    Pt thinks medications are causing her to have trouble with her vision

## 2012-10-25 ENCOUNTER — Telehealth: Payer: Self-pay | Admitting: Internal Medicine

## 2012-10-25 NOTE — Telephone Encounter (Signed)
New prob  Pt states that she is itching and bruising since she has started taking the bystolic. She wants to know if there is something else she can take.

## 2012-10-25 NOTE — Telephone Encounter (Signed)
I spoke with the patient. She states she has been on metoprolol, atenolol, verapamil, and now bystolic. She has had symptoms of insomnia and itching which she  relates to these medications. I have explained to the patient that with the different classes of drugs, and persistant symptoms they may not be coming from these medications. I have reviewed with Orma Flaming D. It may be that symptoms are coming from paxil. I have reviewed this with the patient. She states she has been on this for years. She will review with her PCP. I will have Dr. Graciela Husbands review this note and call her back with recommendations next week. She is agreeable. She is also scheduled to f/u with Dr. Graciela Husbands 8/19 to review medications and potential ablation prior to knee surgery.

## 2012-10-29 NOTE — Telephone Encounter (Signed)
These can be betablocker class affectsbut we can discuss in office 8/19

## 2012-11-03 NOTE — Telephone Encounter (Signed)
I spoke with the patient and made her aware that Dr. Graciela Husbands has reviewed our conversation from last week and will address her medications at her office visit on 8/19. She is agreeable.

## 2012-11-08 ENCOUNTER — Ambulatory Visit (INDEPENDENT_AMBULATORY_CARE_PROVIDER_SITE_OTHER): Payer: Medicare Other | Admitting: Internal Medicine

## 2012-11-08 ENCOUNTER — Encounter: Payer: Self-pay | Admitting: Internal Medicine

## 2012-11-08 VITALS — BP 133/82 | HR 70 | Ht 62.5 in | Wt 169.0 lb

## 2012-11-08 DIAGNOSIS — R1900 Intra-abdominal and pelvic swelling, mass and lump, unspecified site: Secondary | ICD-10-CM

## 2012-11-08 DIAGNOSIS — M1710 Unilateral primary osteoarthritis, unspecified knee: Secondary | ICD-10-CM

## 2012-11-08 DIAGNOSIS — I498 Other specified cardiac arrhythmias: Secondary | ICD-10-CM

## 2012-11-08 DIAGNOSIS — I471 Supraventricular tachycardia: Secondary | ICD-10-CM

## 2012-11-08 DIAGNOSIS — G479 Sleep disorder, unspecified: Secondary | ICD-10-CM

## 2012-11-08 NOTE — Assessment & Plan Note (Signed)
She continues to be treated for SVT. At this point, she is disinclined to pursue catheter ablation. We reviewed again the potential benefits and risks. She would like to continue her Bystolic not withstanding its impact on sleep and itching. She'll try when necessary Benadryl.

## 2012-11-08 NOTE — Assessment & Plan Note (Signed)
As above.

## 2012-11-08 NOTE — Progress Notes (Signed)
.  kf Patient Care Team: Hollice Espy, MD as PCP - General (Family Medicine)   HPI  [name] [name] is a 79 y.o. female Seen in followup for SVT which is most consistent with AV nodal reentry. She was started on metoprolol and largely attenuated her symptoms as well as associated with side effects of salivation and sleep disturbance.At our last visit,she was inclined to pursue medical therapy and we gave her atenolol, verapamil, and bystolic He said been associated with itching and insomnia. There is also question whether it could be coming from her Paxil although she says she's been on it for years.  Her daughter feels it's clearly related to the new medications. She has had no recurrent episodes of tachycardia while taking Bystolic which is currently taking every other day.   Past Medical History  Diagnosis Date  . Arthritis   . Blind left eye   . Dysrhythmia   . Varicose veins     both legs and left fooot at arch  . Complication of anesthesia     sensitive to meds, bp can fall  . SVT (supraventricular tachycardia) 12/15/2011    Past Surgical History  Procedure Laterality Date  . Cholecystectomy    . Tonsillectomy    . Tonsillectomy    . Corneal transplant  right 2009, left 2009    both eyes, 3 in left eye, 1 in right eye  . Cataract surgery  2005    both eyes  . Abdominal hysterectomy   sept 1985    ovaries removed  . Total knee arthroplasty Left 06/27/2012    Procedure: LEFT TOTAL KNEE ARTHROPLASTY;  Surgeon: Loanne Drilling, MD;  Location: [facility];  Service: Orthopedics;  Laterality: Left;    Current Outpatient Prescriptions  Medication Sig Dispense Refill  . aspirin 81 MG tablet Take 81 mg by mouth daily.      . nebivolol (BYSTOLIC) 5 MG tablet Take 1 tablet (5 mg total) by mouth daily.  30 tablet  2  . PARoxetine (PAXIL) 10 MG tablet Take 10 mg by mouth daily.       . prednisoLONE acetate (PRED FORTE) 1 % ophthalmic suspension Place 1 drop into both eyes 2 (two) times  daily.       . sodium chloride (MURO 128) 2 % ophthalmic solution Place 1 drop into both eyes daily as needed (dry eyes).       No current facility-administered medications for this visit.    Allergies  Allergen Reactions  . Ultram [Tramadol] Hives  . Buprenex [Buprenorphine Hcl] Other (See Comments)    Heart raced  . Levofloxacin Other (See Comments)    levaquin unknown reaction  . Percocet [Oxycodone-Acetaminophen] Itching    "felt drained"  . Sulfa Antibiotics Other (See Comments)    unknown  . Tape     Adhesive tape causes skin to pull off  . Zetia [Ezetimibe] Other (See Comments)    unknown    Review of Systems negative except from HPI and PMH  Physical Exam BP 133/82  Pulse 70  Ht 5' 2.5" (1.588 m)  Wt 169 lb (76.658 kg)  BMI 30.4 kg/m2 Well developed and nourished in no acute distress HENT normal Neck supple with JVP-flat Clear Regular rate and rhythm, no murmurs or gallops Abd-soft with active BS No Clubbing cyanosis edema Skin-warm and dry A & Oriented  Grossly normal sensory and motor function I will goDry    Assessment and  Plan

## 2012-11-08 NOTE — Assessment & Plan Note (Signed)
Neg aa ultrasound

## 2012-11-29 ENCOUNTER — Other Ambulatory Visit: Payer: Self-pay | Admitting: Orthopedic Surgery

## 2012-11-29 NOTE — Progress Notes (Signed)
Preoperative surgical orders have been place into the Epic hospital system for [facility] on 11/29/2012, 5:11 PM  by Patrica Duel for surgery on 12/12/2012.  Preop Total Knee orders including Experal, PO Tylenol, and IV Decadron as long as there are no contraindications to the above medications. Avel Peace, PA-C

## 2012-11-30 ENCOUNTER — Encounter (HOSPITAL_COMMUNITY): Payer: Self-pay | Admitting: Pharmacy Technician

## 2012-12-05 ENCOUNTER — Encounter (HOSPITAL_COMMUNITY)
Admission: RE | Admit: 2012-12-05 | Discharge: 2012-12-05 | Disposition: A | Discharge status: Home / Self Care | Payer: Medicare Other | Source: Ambulatory Visit | Attending: Orthopedic Surgery | Admitting: Orthopedic Surgery

## 2012-12-05 ENCOUNTER — Ambulatory Visit (HOSPITAL_COMMUNITY)
Admission: RE | Admit: 2012-12-05 | Discharge: 2012-12-05 | Disposition: A | Discharge status: Home / Self Care | Payer: Medicare Other | Source: Ambulatory Visit | Attending: Orthopedic Surgery | Admitting: Orthopedic Surgery

## 2012-12-05 ENCOUNTER — Encounter (HOSPITAL_COMMUNITY): Payer: Self-pay

## 2012-12-05 DIAGNOSIS — M1710 Unilateral primary osteoarthritis, unspecified knee: Secondary | ICD-10-CM | POA: Insufficient documentation

## 2012-12-05 DIAGNOSIS — Z01812 Encounter for preprocedural laboratory examination: Secondary | ICD-10-CM | POA: Insufficient documentation

## 2012-12-05 LAB — URINALYSIS, ROUTINE W REFLEX MICROSCOPIC
Bilirubin Urine: NEGATIVE
Glucose, UA: NEGATIVE mg/dL
Hgb urine dipstick: NEGATIVE
Ketones, ur: NEGATIVE mg/dL
Nitrite: NEGATIVE
Specific Gravity, Urine: 1.023 (ref 1.005–1.030)
pH: 6 (ref 5.0–8.0)

## 2012-12-05 LAB — CBC
MCV: 93.2 fL (ref 78.0–100.0)
Platelets: 200 10*3/uL (ref 150–400)
RBC: 4.69 MIL/uL (ref 3.87–5.11)
RDW: 14.4 % (ref 11.5–15.5)
WBC: 6.1 10*3/uL (ref 4.0–10.5)

## 2012-12-05 LAB — COMPREHENSIVE METABOLIC PANEL
ALT: 17 U/L (ref 0–35)
AST: 23 U/L (ref 0–37)
Albumin: 3.3 g/dL — ABNORMAL LOW (ref 3.5–5.2)
Alkaline Phosphatase: 106 U/L (ref 39–117)
CO2: 31 mEq/L (ref 19–32)
Chloride: 102 mEq/L (ref 96–112)
GFR calc non Af Amer: 80 mL/min — ABNORMAL LOW (ref >90)
Potassium: 4.3 mEq/L (ref 3.5–5.1)
Total Bilirubin: 0.3 mg/dL (ref 0.3–1.2)

## 2012-12-05 LAB — URINE MICROSCOPIC-ADD ON

## 2012-12-05 LAB — APTT: aPTT: 27 seconds (ref 24–37)

## 2012-12-05 LAB — SURGICAL PCR SCREEN: Staphylococcus aureus: NEGATIVE

## 2012-12-05 LAB — PROTIME-INR: INR: 0.99 (ref 0.00–1.49)

## 2012-12-05 NOTE — Patient Instructions (Addendum)
20 Vedika Dumlao  12/05/2012   Your procedure is scheduled on:  9-22 -2014  Report to [facility] at    0730    AM .  Call this number if you have problems the morning of surgery: [phone]  Or Presurgical Testing [phone](Xaden Kaufman)     Do not eat food:After Midnight.    Take these medicines the morning of surgery with A SIP OF WATER: Bystolic(take as usual). Use eyedrops/bring.   Do not wear jewelry, make-up or nail polish.  Do not wear lotions, powders, or perfumes. You may wear deodorant.  Do not shave 12 hours prior to first CHG shower(legs and under arms).(face and neck okay.)  Do not bring valuables to the hospital.  Contacts, dentures or bridgework,body piercing,  may not be worn into surgery.  Leave suitcase in the car. After surgery it may be brought to your room.  For patients admitted to the hospital, checkout time is 11:00 AM the day of discharge.   Patients discharged the day of surgery will not be allowed to drive home. Must have responsible person with you x 24 hours once discharged.  Name and phone number of your driver: Maryjane Hurter, daughter -[phone] cell  Special Instructions: CHG(Chlorhedine 4%-"Hibiclens","Betasept","Aplicare") Shower Use Special Wash: see special instructions.(avoid face and genitals)   Please read over the following fact sheets that you were given: MRSA Information, Blood Transfusion fact sheet, Incentive Spirometry Instruction.    Failure to follow these instructions may result in Cancellation of your surgery.   Patient signature_______________________________________________________

## 2012-12-05 NOTE — Pre-Procedure Instructions (Addendum)
12-05-12 EKG 8'14/ CXR 4'14- Epic. CXR repeated today per order Dr. Council Mechanic. 12-06-14 1620 Labs viewable in Epic faxed.W. Kennon Portela

## 2012-12-05 NOTE — Progress Notes (Signed)
12-05-12 1620 Urinalysis ,labs viewable in Epic,-please note.

## 2012-12-11 ENCOUNTER — Other Ambulatory Visit: Payer: Self-pay | Admitting: Orthopedic Surgery

## 2012-12-11 NOTE — H&P (Signed)
[name] [name]  DOB: [birth year] Married / Language: English / Race: White Female  Date of Admission:  12/12/2012  Chief Complaint:  Right Knee Pain  History of Present Illness The patient is a 79 year old female who comes in for a preoperative History and Physical. The patient is scheduled for a right total knee arthroplasty to be performed by Dr. Gus Rankin. Aluisio, MD at [facility] on 12/12/2012. The patient is being followed for their right knee pain. They are now 6 week(s) out from cortisone injection. Symptoms reported today include: pain. The patient feels that they are doing poorly. The patient has reported improvement of their symptoms with: Cortisone injections (did not help at all). She is ready now to get the right knee fixed. Her left knee continues to progress and now ready to proceed with the other side. They have been treated conservatively in the past for the above stated problem and despite conservative measures, they continue to have progressive pain and severe functional limitations and dysfunction. They have failed non-operative management including home exercise, medications, and injections. It is felt that they would benefit from undergoing total joint replacement. Risks and benefits of the procedure have been discussed with the patient and they elect to proceed with surgery. There are no active contraindications to surgery such as ongoing infection or rapidly progressive neurological disease.   Problem List S/P Left total knee arthroplasty (V43.65) Primary osteoarthritis of one knee (715.16). right   Allergies Sulfa Drugs. Hives. Buprenex *ANALGESICS - OPIOID*. Tachycardia Hydrocodone-Acetaminophen *ANALGESICS - OPIOID* Levaquin *FLUOROQUINOLONES* Zetia *ANTIHYPERLIPIDEMICS*   Family History Rheumatoid Arthritis. grandmother mothers side Cerebrovascular Accident. mother Heart Disease. father and brother Cancer.  brother Hypertension. mother   Social History Post-Surgical Plans. Plan for [facility] Drug/Alcohol Rehab (Previously). no Alcohol use. never consumed alcohol Drug/Alcohol Rehab (Currently). no Living situation. live alone Tobacco use. never smoker Current work status. retired Exercise. Exercises weekly; does other Marital status. widowed Illicit drug use. no Pain Contract. no Children. 3   Medication History PARoxetine HCl (20MG  Tablet, Oral) Active. PrednisoLONE Acetate (1% Suspension, Ophthalmic) Active. Aspirin EC (81MG  Tablet DR, Oral) Active. Osteo-Bi-Flex ( Oral) Specific dose unknown - Active. Multivitamin ( Oral) Active.  Past Surgical History Gallbladder Surgery. Date: 65. laporoscopic Hysterectomy. Date: 31. complete (non-cancerous) Cataract Extraction-Bilateral Tonsillectomy. Date: 96.   Medical History Urinary Frequency History of Tachycardia Hypoglycemia Left Eye Blindness Osteoporosis Osteoarthritis State, symptomatic menopause/fem climacteric (627.2). 10/01/2003   Review of Systems General:Not Present- Chills, Fever, Night Sweats, Fatigue, Weight Gain, Weight Loss and Memory Loss. Skin:Not Present- Hives, Itching, Rash, Eczema and Lesions. HEENT:Not Present- Tinnitus, Headache, Double Vision, Visual Loss, Hearing Loss and Dentures. Respiratory:Not Present- Shortness of breath with exertion, Shortness of breath at rest, Allergies, Coughing up blood and Chronic Cough. Cardiovascular:Not Present- Chest Pain, Racing/skipping heartbeats, Difficulty Breathing Lying Down, Murmur, Swelling and Palpitations. Gastrointestinal:Not Present- Bloody Stool, Heartburn, Abdominal Pain, Vomiting, Nausea, Constipation, Diarrhea, Difficulty Swallowing, Jaundice and Loss of appetitie. Female Genitourinary:Present- Urinary frequency. Not Present- Blood in Urine, Weak urinary stream, Discharge, Flank Pain, Incontinence, Painful Urination,  Urgency, Urinary Retention and Urinating at Night. Musculoskeletal:Present- Joint Pain. Not Present- Muscle Weakness, Muscle Pain, Joint Swelling, Back Pain, Morning Stiffness and Spasms. Neurological:Not Present- Tremor, Dizziness, Blackout spells, Paralysis, Difficulty with balance and Weakness. Psychiatric:Not Present- Insomnia.   Vitals Pulse: 68 (Regular) Resp.: 14 (Unlabored) BP: 138/72 (Sitting, Right Arm, Standard)    Physical Exam The physical exam findings are as follows:  Note: Pateint is an 79  year old female with continued bilateral knee pain. Patient is accompanied today by her son, Brett Canales.   General Mental Status - Alert, cooperative and good historian. General Appearance- pleasant. Not in acute distress. Orientation- Oriented X3. Build & Nutrition- Well nourished and Well developed.   Head and Neck Head- normocephalic, atraumatic . Neck Global Assessment- supple. no bruit auscultated on the right and no bruit auscultated on the left.   Eye Vision- Wears corrective lenses. Pupil- Bilateral- Regular (right eye) and Round (right eye). Motion- Bilateral- EOMI.   Chest and Lung Exam Auscultation: Breath sounds:- clear at anterior chest wall and - clear at posterior chest wall. Adventitious sounds:- No Adventitious sounds.   Cardiovascular Auscultation:Rhythm- Regular rate and rhythm. Heart Sounds- S1 WNL and S2 WNL. Murmurs & Other Heart Sounds:Auscultation of the heart reveals - No Murmurs.   Abdomen Palpation/Percussion:Tenderness- Abdomen is non-tender to palpation. Rigidity (guarding)- Abdomen is soft. Auscultation:Auscultation of the abdomen reveals - Bowel sounds normal.   Female Genitourinary Not done, not pertinent to present illness  Musculoskeletal  On exam, she is a well-developed female, alert and oriented, in no apparent distress. Evaluation of her hips shows normal ROM, no discomfort. Her left  knee shows no effusion. ROM on the left is about 5-125 with no tenderness or instability. Right knee shows no effusion. Range is about 5-120. Marked crepitus on ROM. Varus deformity. Tender medial greater than lateral with no instability noted.  RADIOGRAPHS: AP of both knees and lateral show advanced arthritic changes, right worse than left knee, bone on bone medial and patellofemoral.  Assessment & Plan Primary osteoarthritis of one knee (715.16) Story: right  Note: Plan is for a Right Total Knee Replacement by Dr. Lequita Halt.  Plan is to go to SNF.  PCP - Dr. Shaune Pollack - Patient has been seen preoperatively and felt to be stable for surgery.  The patient does not have any contraindications and will recieve TXA (tranexamic acid) prior to surgery.  Signed electronically by Lauraine Rinne, III PA-C

## 2012-12-12 ENCOUNTER — Encounter (HOSPITAL_COMMUNITY): Payer: Self-pay | Admitting: Anesthesiology

## 2012-12-12 ENCOUNTER — Encounter (HOSPITAL_COMMUNITY)
Admission: RE | Disposition: A | Discharge status: Skilled Nursing Facility | Payer: Self-pay | Source: Ambulatory Visit | Attending: Orthopedic Surgery

## 2012-12-12 ENCOUNTER — Inpatient Hospital Stay (HOSPITAL_COMMUNITY)
Admission: RE | Admit: 2012-12-12 | Discharge: 2012-12-15 | DRG: 470 | Disposition: A | Discharge status: Skilled Nursing Facility | Payer: Medicare Other | Source: Ambulatory Visit | Attending: Orthopedic Surgery | Admitting: Orthopedic Surgery

## 2012-12-12 ENCOUNTER — Inpatient Hospital Stay (HOSPITAL_COMMUNITY): Payer: Medicare Other | Admitting: Anesthesiology

## 2012-12-12 ENCOUNTER — Encounter (HOSPITAL_COMMUNITY): Payer: Self-pay | Admitting: *Deleted

## 2012-12-12 DIAGNOSIS — D62 Acute posthemorrhagic anemia: Secondary | ICD-10-CM | POA: Diagnosis not present

## 2012-12-12 DIAGNOSIS — I8390 Asymptomatic varicose veins of unspecified lower extremity: Secondary | ICD-10-CM | POA: Diagnosis present

## 2012-12-12 DIAGNOSIS — Z96651 Presence of right artificial knee joint: Secondary | ICD-10-CM

## 2012-12-12 DIAGNOSIS — M1710 Unilateral primary osteoarthritis, unspecified knee: Principal | ICD-10-CM | POA: Diagnosis present

## 2012-12-12 DIAGNOSIS — M659 Unspecified synovitis and tenosynovitis, unspecified site: Secondary | ICD-10-CM | POA: Diagnosis present

## 2012-12-12 DIAGNOSIS — Z79899 Other long term (current) drug therapy: Secondary | ICD-10-CM

## 2012-12-12 DIAGNOSIS — I471 Supraventricular tachycardia: Secondary | ICD-10-CM

## 2012-12-12 DIAGNOSIS — M810 Age-related osteoporosis without current pathological fracture: Secondary | ICD-10-CM | POA: Diagnosis present

## 2012-12-12 DIAGNOSIS — H5440 Blindness, one eye, unspecified eye: Secondary | ICD-10-CM | POA: Diagnosis present

## 2012-12-12 DIAGNOSIS — Z8261 Family history of arthritis: Secondary | ICD-10-CM

## 2012-12-12 DIAGNOSIS — Z7982 Long term (current) use of aspirin: Secondary | ICD-10-CM

## 2012-12-12 HISTORY — PX: TOTAL KNEE ARTHROPLASTY: SHX125

## 2012-12-12 LAB — TYPE AND SCREEN
ABO/RH(D): A POS
Antibody Screen: NEGATIVE

## 2012-12-12 SURGERY — ARTHROPLASTY, KNEE, TOTAL
Anesthesia: Spinal | Site: Knee | Laterality: Right | Wound class: Clean

## 2012-12-12 MED ORDER — MEPERIDINE HCL 50 MG/ML IJ SOLN
INTRAMUSCULAR | Status: AC
  Filled 2012-12-12: qty 1

## 2012-12-12 MED ORDER — BUPIVACAINE LIPOSOME 1.3 % IJ SUSP
20.0000 mL | Freq: Once | INTRAMUSCULAR | Status: DC
  Filled 2012-12-12: qty 20

## 2012-12-12 MED ORDER — BUPIVACAINE HCL 0.25 % IJ SOLN
INTRAMUSCULAR | Status: DC | PRN
  Administered 2012-12-12: 20 mL

## 2012-12-12 MED ORDER — CHLORHEXIDINE GLUCONATE 4 % EX LIQD: 60.0000 mL | Freq: Once | CUTANEOUS | Status: DC

## 2012-12-12 MED ORDER — BUPIVACAINE IN DEXTROSE 0.75-8.25 % IT SOLN
INTRATHECAL | Status: DC | PRN
  Administered 2012-12-12: 1.6 mL via INTRATHECAL

## 2012-12-12 MED ORDER — KETOROLAC TROMETHAMINE 15 MG/ML IJ SOLN
7.5000 mg | Freq: Four times a day (QID) | INTRAMUSCULAR | Status: AC | PRN
  Administered 2012-12-12 – 2012-12-13 (×2): 7.5 mg via INTRAVENOUS
  Filled 2012-12-12 (×2): qty 1

## 2012-12-12 MED ORDER — METHOCARBAMOL 500 MG PO TABS
500.0000 mg | ORAL_TABLET | Freq: Four times a day (QID) | ORAL | Status: DC | PRN
  Administered 2012-12-13 – 2012-12-14 (×5): 500 mg via ORAL
  Filled 2012-12-12 (×5): qty 1

## 2012-12-12 MED ORDER — TRANEXAMIC ACID 100 MG/ML IV SOLN
1000.0000 mg | INTRAVENOUS | Status: AC
  Administered 2012-12-12: 1000 mg via INTRAVENOUS
  Filled 2012-12-12: qty 10

## 2012-12-12 MED ORDER — MENTHOL 3 MG MT LOZG: 1.0000 | LOZENGE | OROMUCOSAL | Status: DC | PRN

## 2012-12-12 MED ORDER — DEXAMETHASONE 4 MG PO TABS
10.0000 mg | ORAL_TABLET | Freq: Every day | ORAL | Status: AC
  Administered 2012-12-13: 09:00:00 10 mg via ORAL
  Filled 2012-12-12: qty 1

## 2012-12-12 MED ORDER — ONDANSETRON HCL 4 MG/2ML IJ SOLN
INTRAMUSCULAR | Status: DC | PRN
  Administered 2012-12-12: 4 mg via INTRAVENOUS

## 2012-12-12 MED ORDER — CEFAZOLIN SODIUM 1-5 GM-% IV SOLN
1.0000 g | Freq: Four times a day (QID) | INTRAVENOUS | Status: AC
  Administered 2012-12-12 (×2): 1 g via INTRAVENOUS
  Filled 2012-12-12 (×2): qty 50

## 2012-12-12 MED ORDER — CEFAZOLIN SODIUM-DEXTROSE 2-3 GM-% IV SOLR
INTRAVENOUS | Status: AC
  Filled 2012-12-12: qty 50

## 2012-12-12 MED ORDER — BUPIVACAINE HCL (PF) 0.25 % IJ SOLN
INTRAMUSCULAR | Status: AC
  Filled 2012-12-12: qty 30

## 2012-12-12 MED ORDER — ONDANSETRON HCL 4 MG/2ML IJ SOLN: 4.0000 mg | Freq: Four times a day (QID) | INTRAMUSCULAR | Status: DC | PRN

## 2012-12-12 MED ORDER — BUPIVACAINE LIPOSOME 1.3 % IJ SUSP
INTRAMUSCULAR | Status: DC | PRN
  Administered 2012-12-12: 20 mL

## 2012-12-12 MED ORDER — FLEET ENEMA 7-19 GM/118ML RE ENEM: 1.0000 | ENEMA | Freq: Once | RECTAL | Status: AC | PRN

## 2012-12-12 MED ORDER — MEPERIDINE HCL 50 MG/ML IJ SOLN
6.2500 mg | INTRAMUSCULAR | Status: DC | PRN
  Administered 2012-12-12: 12.5 mg via INTRAVENOUS

## 2012-12-12 MED ORDER — PROPOFOL INFUSION 10 MG/ML OPTIME
INTRAVENOUS | Status: DC | PRN
  Administered 2012-12-12: 120 ug/kg/min via INTRAVENOUS

## 2012-12-12 MED ORDER — HYDROMORPHONE HCL PF 1 MG/ML IJ SOLN
0.5000 mg | INTRAMUSCULAR | Status: DC | PRN
  Filled 2012-12-12: qty 1

## 2012-12-12 MED ORDER — FENTANYL CITRATE 0.05 MG/ML IJ SOLN
INTRAMUSCULAR | Status: AC
  Filled 2012-12-12: qty 2

## 2012-12-12 MED ORDER — ACETAMINOPHEN 500 MG PO TABS
1000.0000 mg | ORAL_TABLET | Freq: Once | ORAL | Status: AC
  Administered 2012-12-12: 1000 mg via ORAL
  Filled 2012-12-12: qty 2

## 2012-12-12 MED ORDER — SODIUM CHLORIDE 0.9 % IV SOLN: INTRAVENOUS | Status: DC

## 2012-12-12 MED ORDER — ACETAMINOPHEN 500 MG PO TABS
1000.0000 mg | ORAL_TABLET | Freq: Four times a day (QID) | ORAL | Status: AC
  Administered 2012-12-12: 1000 mg via ORAL
  Filled 2012-12-12: qty 2

## 2012-12-12 MED ORDER — DEXAMETHASONE SODIUM PHOSPHATE 10 MG/ML IJ SOLN
10.0000 mg | Freq: Every day | INTRAMUSCULAR | Status: AC
  Filled 2012-12-12: qty 1

## 2012-12-12 MED ORDER — POLYETHYLENE GLYCOL 3350 17 G PO PACK: 17.0000 g | PACK | Freq: Every day | ORAL | Status: DC | PRN

## 2012-12-12 MED ORDER — SODIUM CHLORIDE 0.9 % IJ SOLN
INTRAMUSCULAR | Status: DC | PRN
  Administered 2012-12-12: 30 mL

## 2012-12-12 MED ORDER — PAROXETINE HCL 10 MG PO TABS
10.0000 mg | ORAL_TABLET | ORAL | Status: DC
  Administered 2012-12-13 – 2012-12-15 (×2): 10 mg via ORAL
  Filled 2012-12-12 (×2): qty 1

## 2012-12-12 MED ORDER — PHENOL 1.4 % MT LIQD: 1.0000 | OROMUCOSAL | Status: DC | PRN

## 2012-12-12 MED ORDER — ONDANSETRON HCL 4 MG PO TABS: 4.0000 mg | ORAL_TABLET | Freq: Four times a day (QID) | ORAL | Status: DC | PRN

## 2012-12-12 MED ORDER — LACTATED RINGERS IV SOLN
INTRAVENOUS | Status: DC
  Administered 2012-12-12: 1000 mL via INTRAVENOUS

## 2012-12-12 MED ORDER — METHOCARBAMOL 100 MG/ML IJ SOLN
500.0000 mg | Freq: Four times a day (QID) | INTRAVENOUS | Status: DC | PRN
  Administered 2012-12-12: 500 mg via INTRAVENOUS
  Filled 2012-12-12 (×2): qty 5

## 2012-12-12 MED ORDER — DOCUSATE SODIUM 100 MG PO CAPS
100.0000 mg | ORAL_CAPSULE | Freq: Two times a day (BID) | ORAL | Status: DC
  Administered 2012-12-12 – 2012-12-15 (×6): 100 mg via ORAL

## 2012-12-12 MED ORDER — DIPHENHYDRAMINE HCL 12.5 MG/5ML PO ELIX: 12.5000 mg | ORAL_SOLUTION | ORAL | Status: DC | PRN

## 2012-12-12 MED ORDER — CEFAZOLIN SODIUM-DEXTROSE 2-3 GM-% IV SOLR
2.0000 g | INTRAVENOUS | Status: AC
  Administered 2012-12-12: 2 g via INTRAVENOUS

## 2012-12-12 MED ORDER — FENTANYL CITRATE 0.05 MG/ML IJ SOLN
25.0000 ug | INTRAMUSCULAR | Status: DC | PRN
  Administered 2012-12-12 (×3): 25 ug via INTRAVENOUS

## 2012-12-12 MED ORDER — BISACODYL 10 MG RE SUPP: 10.0000 mg | Freq: Every day | RECTAL | Status: DC | PRN

## 2012-12-12 MED ORDER — SODIUM CHLORIDE 0.9 % IJ SOLN
INTRAMUSCULAR | Status: AC
  Filled 2012-12-12: qty 50

## 2012-12-12 MED ORDER — DEXAMETHASONE SODIUM PHOSPHATE 10 MG/ML IJ SOLN
10.0000 mg | Freq: Once | INTRAMUSCULAR | Status: AC
  Administered 2012-12-12: 10 mg via INTRAVENOUS

## 2012-12-12 MED ORDER — RIVAROXABAN 10 MG PO TABS
10.0000 mg | ORAL_TABLET | Freq: Every day | ORAL | Status: DC
  Administered 2012-12-13 – 2012-12-15 (×3): 10 mg via ORAL
  Filled 2012-12-12 (×4): qty 1

## 2012-12-12 MED ORDER — METOCLOPRAMIDE HCL 5 MG/ML IJ SOLN: 5.0000 mg | Freq: Three times a day (TID) | INTRAMUSCULAR | Status: DC | PRN

## 2012-12-12 MED ORDER — PHENYLEPHRINE HCL 10 MG/ML IJ SOLN
10.0000 mg | INTRAVENOUS | Status: DC | PRN
  Administered 2012-12-12: 50 ug/min via INTRAVENOUS

## 2012-12-12 MED ORDER — PHENYLEPHRINE HCL 10 MG/ML IJ SOLN
INTRAMUSCULAR | Status: DC | PRN
  Administered 2012-12-12 (×2): 80 ug via INTRAVENOUS

## 2012-12-12 MED ORDER — NEBIVOLOL HCL 5 MG PO TABS
5.0000 mg | ORAL_TABLET | ORAL | Status: DC
  Administered 2012-12-14: 17:00:00 5 mg via ORAL
  Filled 2012-12-12: qty 1

## 2012-12-12 MED ORDER — PROMETHAZINE HCL 25 MG/ML IJ SOLN: 6.2500 mg | INTRAMUSCULAR | Status: DC | PRN

## 2012-12-12 MED ORDER — HYDROMORPHONE HCL 2 MG PO TABS
2.0000 mg | ORAL_TABLET | ORAL | Status: DC | PRN
  Administered 2012-12-12 – 2012-12-14 (×8): 2 mg via ORAL
  Administered 2012-12-14: 18:00:00 4 mg via ORAL
  Administered 2012-12-14 – 2012-12-15 (×6): 2 mg via ORAL
  Filled 2012-12-12 (×5): qty 1
  Filled 2012-12-12: qty 2
  Filled 2012-12-12 (×8): qty 1
  Filled 2012-12-12: qty 2

## 2012-12-12 MED ORDER — DEXTROSE-NACL 5-0.9 % IV SOLN
INTRAVENOUS | Status: DC
  Administered 2012-12-12 – 2012-12-13 (×2): via INTRAVENOUS

## 2012-12-12 MED ORDER — LIDOCAINE HCL (CARDIAC) 20 MG/ML IV SOLN
INTRAVENOUS | Status: DC | PRN
  Administered 2012-12-12: 100 mg via INTRAVENOUS

## 2012-12-12 MED ORDER — METOCLOPRAMIDE HCL 10 MG PO TABS: 5.0000 mg | ORAL_TABLET | Freq: Three times a day (TID) | ORAL | Status: DC | PRN

## 2012-12-12 MED ORDER — PREDNISOLONE ACETATE 1 % OP SUSP
1.0000 [drp] | Freq: Two times a day (BID) | OPHTHALMIC | Status: DC
  Administered 2012-12-12 – 2012-12-15 (×5): 1 [drp] via OPHTHALMIC
  Filled 2012-12-12: qty 1

## 2012-12-12 SURGICAL SUPPLY — 55 items
BAG ZIPLOCK 12X15 (MISCELLANEOUS) ×2 IMPLANT
BANDAGE ELASTIC 6 VELCRO ST LF (GAUZE/BANDAGES/DRESSINGS) ×2 IMPLANT
BANDAGE ESMARK 6X9 LF (GAUZE/BANDAGES/DRESSINGS) ×1 IMPLANT
BLADE SAG 18X100X1.27 (BLADE) ×2 IMPLANT
BLADE SAW SGTL 11.0X1.19X90.0M (BLADE) ×2 IMPLANT
BNDG ESMARK 6X9 LF (GAUZE/BANDAGES/DRESSINGS) ×2
BOWL SMART MIX CTS (DISPOSABLE) ×2 IMPLANT
CAPT RP KNEE ×2 IMPLANT
CEMENT HV SMART SET (Cement) ×4 IMPLANT
CLOTH BEACON ORANGE TIMEOUT ST (SAFETY) ×2 IMPLANT
CUFF TOURN SGL QUICK 34 (TOURNIQUET CUFF) ×1
CUFF TRNQT CYL 34X4X40X1 (TOURNIQUET CUFF) ×1 IMPLANT
DECANTER SPIKE VIAL GLASS SM (MISCELLANEOUS) ×2 IMPLANT
DRAPE EXTREMITY T 121X128X90 (DRAPE) ×2 IMPLANT
DRAPE POUCH INSTRU U-SHP 10X18 (DRAPES) ×2 IMPLANT
DRAPE U-SHAPE 47X51 STRL (DRAPES) ×2 IMPLANT
DRSG ADAPTIC 3X8 NADH LF (GAUZE/BANDAGES/DRESSINGS) ×2 IMPLANT
DRSG PAD ABDOMINAL 8X10 ST (GAUZE/BANDAGES/DRESSINGS) ×2 IMPLANT
DURAPREP 26ML APPLICATOR (WOUND CARE) ×2 IMPLANT
ELECT REM PT RETURN 9FT ADLT (ELECTROSURGICAL) ×2
ELECTRODE REM PT RTRN 9FT ADLT (ELECTROSURGICAL) ×1 IMPLANT
EVACUATOR 1/8 PVC DRAIN (DRAIN) ×2 IMPLANT
FACESHIELD LNG OPTICON STERILE (SAFETY) ×10 IMPLANT
GLOVE BIO SURGEON STRL SZ7.5 (GLOVE) IMPLANT
GLOVE BIO SURGEON STRL SZ8 (GLOVE) ×2 IMPLANT
GLOVE BIOGEL PI IND STRL 8 (GLOVE) ×2 IMPLANT
GLOVE BIOGEL PI INDICATOR 8 (GLOVE) ×2
GLOVE SURG SS PI 6.5 STRL IVOR (GLOVE) IMPLANT
GOWN PREVENTION PLUS LG XLONG (DISPOSABLE) ×2 IMPLANT
GOWN STRL REIN XL XLG (GOWN DISPOSABLE) IMPLANT
HANDPIECE INTERPULSE COAX TIP (DISPOSABLE) ×2
IMMOBILIZER KNEE 20 (SOFTGOODS) ×2
IMMOBILIZER KNEE 20 THIGH 36 (SOFTGOODS) ×1 IMPLANT
KIT BASIN OR (CUSTOM PROCEDURE TRAY) ×2 IMPLANT
MANIFOLD NEPTUNE II (INSTRUMENTS) ×2 IMPLANT
NDL SAFETY ECLIPSE 18X1.5 (NEEDLE) ×2 IMPLANT
NEEDLE HYPO 18GX1.5 SHARP (NEEDLE) ×4
NS IRRIG 1000ML POUR BTL (IV SOLUTION) ×2 IMPLANT
PACK TOTAL JOINT (CUSTOM PROCEDURE TRAY) ×2 IMPLANT
PADDING CAST COTTON 6X4 STRL (CAST SUPPLIES) ×4 IMPLANT
POSITIONER SURGICAL ARM (MISCELLANEOUS) ×2 IMPLANT
SET HNDPC FAN SPRY TIP SCT (DISPOSABLE) ×1 IMPLANT
SPONGE GAUZE 4X4 12PLY (GAUZE/BANDAGES/DRESSINGS) ×2 IMPLANT
STRIP CLOSURE SKIN 1/2X4 (GAUZE/BANDAGES/DRESSINGS) ×4 IMPLANT
SUCTION FRAZIER 12FR DISP (SUCTIONS) ×2 IMPLANT
SUT MNCRL AB 4-0 PS2 18 (SUTURE) ×2 IMPLANT
SUT VIC AB 2-0 CT1 27 (SUTURE) ×6
SUT VIC AB 2-0 CT1 TAPERPNT 27 (SUTURE) ×3 IMPLANT
SUT VLOC 180 0 24IN GS25 (SUTURE) ×2 IMPLANT
SYR 20CC LL (SYRINGE) ×2 IMPLANT
SYR 50ML LL SCALE MARK (SYRINGE) ×2 IMPLANT
TOWEL OR 17X26 10 PK STRL BLUE (TOWEL DISPOSABLE) ×4 IMPLANT
TRAY FOLEY CATH 14FRSI W/METER (CATHETERS) ×2 IMPLANT
WATER STERILE IRR 1500ML POUR (IV SOLUTION) ×2 IMPLANT
WRAP KNEE MAXI GEL POST OP (GAUZE/BANDAGES/DRESSINGS) ×2 IMPLANT

## 2012-12-12 NOTE — Anesthesia Postprocedure Evaluation (Signed)
  Anesthesia Post-op Note  Patient: [name] [name]  Procedure(s) Performed: Procedure(s) (LRB): RIGHT TOTAL KNEE ARTHROPLASTY (Right)  Patient Location: PACU  Anesthesia Type: Spinal  Level of Consciousness: awake and alert   Airway and Oxygen Therapy: Patient Spontanous Breathing  Post-op Pain: mild  Post-op Assessment: Post-op Vital signs reviewed, Patient's Cardiovascular Status Stable, Respiratory Function Stable, Patent Airway and No signs of Nausea or vomiting  Last Vitals:  Filed Vitals:   12/12/12 1200  BP: 185/96  Pulse: 72  Temp: 36.4 C  Resp: 14    Post-op Vital Signs: stable   Complications: No apparent anesthesia complications

## 2012-12-12 NOTE — Preoperative (Signed)
Beta Blockers   Reason not to administer Beta Blockers:Not Applicable 

## 2012-12-12 NOTE — Op Note (Signed)
Pre-operative diagnosis- Osteoarthritis  Right knee(s)  Post-operative diagnosis- Osteoarthritis Right knee(s)  Procedure-  Right  Total Knee Arthroplasty  Surgeon- Gus Rankin. Jacquise Rarick, MD  Assistant- Avel Peace, PA-C   Anesthesia-  Spinal EBL-* No blood loss amount entered *  Drains Hemovac  Tourniquet time- 34 minutes @ 300 mm Hg    Complications- None  Condition-PACU - hemodynamically stable.   Brief Clinical Note  [name] [name] is a 79 y.o. year old female with end stage OA of her right knee with progressively worsening pain and dysfunction. She has constant pain, with activity and at rest and significant functional deficits with difficulties even with ADLs. She has had extensive non-op management including analgesics, injections of cortisone and viscosupplements, and home exercise program, but remains in significant pain with significant dysfunction.Radiographs show bone on bone arthritis medial and patellofemoral. She presents now for right Total Knee Arthroplasty.    Procedure in detail---   The patient is brought into the operating room and positioned supine on the operating table. After successful administration of  Spinal,   a tourniquet is placed high on the  Right thigh(s) and the lower extremity is prepped and draped in the usual sterile fashion. Time out is performed by the operating team and then the  Right lower extremity is wrapped in Esmarch, knee flexed and the tourniquet inflated to 300 mmHg.       A midline incision is made with a ten blade through the subcutaneous tissue to the level of the extensor mechanism. A fresh blade is used to make a medial parapatellar arthrotomy. Soft tissue over the proximal medial tibia is subperiosteally elevated to the joint line with a knife and into the semimembranosus bursa with a Cobb elevator. Soft tissue over the proximal lateral tibia is elevated with attention being paid to avoiding the patellar tendon on the tibial tubercle. The  patella is everted, knee flexed 90 degrees and the ACL and PCL are removed. Findings are bone on bone medial and patellofemoral with massive synovitis.        The drill is used to create a starting hole in the distal femur and the canal is thoroughly irrigated with sterile saline to remove the fatty contents. The 5 degree Right  valgus alignment guide is placed into the femoral canal and the distal femoral cutting block is pinned to remove 10 mm off the distal femur. Resection is made with an oscillating saw.      The tibia is subluxed forward and the menisci are removed. The extramedullary alignment guide is placed referencing proximally at the medial aspect of the tibial tubercle and distally along the second metatarsal axis and tibial crest. The block is pinned to remove 2mm off the more deficient medial  side. Resection is made with an oscillating saw. Size 3is the most appropriate size for the tibia and the proximal tibia is prepared with the modular drill and keel punch for that size.      The femoral sizing guide is placed and size 3 is most appropriate. Rotation is marked off the epicondylar axis and confirmed by creating a rectangular flexion gap at 90 degrees. The size 3 cutting block is pinned in this rotation and the anterior, posterior and chamfer cuts are made with the oscillating saw. The intercondylar block is then placed and that cut is made.      Trial size 3 tibial component, trial size 3 posterior stabilized femur and a 15  mm posterior stabilized rotating platform insert  trial is placed. Full extension is achieved with excellent varus/valgus and anterior/posterior balance throughout full range of motion. The patella is everted and thickness measured to be 22  mm. Free hand resection is taken to 12 mm, a 38 template is placed, lug holes are drilled, trial patella is placed, and it tracks normally. Osteophytes are removed off the posterior femur with the trial in place. All trials are removed  and the cut bone surfaces prepared with pulsatile lavage. Cement is mixed and once ready for implantation, the size 3 tibial implant, size  3 posterior stabilized femoral component, and the size 38 patella are cemented in place and the patella is held with the clamp. The trial insert is placed and the knee held in full extension. The Exparel (20 ml mixed with 30 ml saline) and .25% Bupivicaine, are injected into the extensor mechanism, posterior capsule, medial and lateral gutters and subcutaneous tissues.  All extruded cement is removed and once the cement is hard the permanent 15 mm posterior stabilized rotating platform insert is placed into the tibial tray.      The wound is copiously irrigated with saline solution and the extensor mechanism closed over a hemovac drain with #1 PDS suture. The tourniquet is released for a total tourniquet time of 34  minutes. Flexion against gravity is 140 degrees and the patella tracks normally. Subcutaneous tissue is closed with 2.0 vicryl and subcuticular with running 4.0 Monocryl. The incision is cleaned and dried and steri-strips and a bulky sterile dressing are applied. The limb is placed into a knee immobilizer and the patient is awakened and transported to recovery in stable condition.      Please note that a surgical assistant was a medical necessity for this procedure in order to perform it in a safe and expeditious manner. Surgical assistant was necessary to retract the ligaments and vital neurovascular structures to prevent injury to them and also necessary for proper positioning of the limb to allow for anatomic placement of the prosthesis.   Gus Rankin Iysis Germain, MD    12/12/2012, 11:39 AM

## 2012-12-12 NOTE — Transfer of Care (Signed)
Immediate Anesthesia Transfer of Care Note  Patient: [name] [name]  Procedure(s) Performed: Procedure(s) (LRB): RIGHT TOTAL KNEE ARTHROPLASTY (Right)  Patient Location: PACU  Anesthesia Type: Spinal  Level of Consciousness: sedated, patient cooperative and responds to stimulation  Airway & Oxygen Therapy: Patient Spontanous Breathing and Patient connected to face mask oxgen  Post-op Assessment: Report given to PACU RN and Post -op Vital signs reviewed and stable  Post vital signs: Reviewed and stable  Complications: No apparent anesthesia complications

## 2012-12-12 NOTE — Anesthesia Preprocedure Evaluation (Signed)
Anesthesia Evaluation  Patient identified by MRN, date of birth, ID band Patient awake    Reviewed: Allergy & Precautions, H&P , NPO status , Patient's Chart, lab work & pertinent test results  Airway Mallampati: II TM Distance: >3 FB Neck ROM: Limited    Dental no notable dental hx.    Pulmonary neg pulmonary ROS,  breath sounds clear to auscultation  Pulmonary exam normal       Cardiovascular Rhythm:Regular Rate:Normal  SVT (supraventricular tachycardia) 12/15/2011    Neuro/Psych negative neurological ROS  negative psych ROS   GI/Hepatic negative GI ROS, Neg liver ROS,   Endo/Other  negative endocrine ROS  Renal/GU negative Renal ROS  negative genitourinary   Musculoskeletal negative musculoskeletal ROS (+)   Abdominal   Peds negative pediatric ROS (+)  Hematology negative hematology ROS (+)   Anesthesia Other Findings   Reproductive/Obstetrics negative OB ROS                           Anesthesia Physical Anesthesia Plan  ASA: II  Anesthesia Plan: Spinal   Post-op Pain Management:    Induction: Intravenous  Airway Management Planned: Simple Face Mask  Additional Equipment:   Intra-op Plan:   Post-operative Plan:   Informed Consent: I have reviewed the patients History and Physical, chart, labs and discussed the procedure including the risks, benefits and alternatives for the proposed anesthesia with the patient or authorized representative who has indicated his/her understanding and acceptance.     Plan Discussed with: CRNA and Surgeon  Anesthesia Plan Comments:         Anesthesia Quick Evaluation

## 2012-12-12 NOTE — Evaluation (Signed)
Physical Therapy Evaluation Patient Details Name: [name] [name] MRN: [id number] DOB: Oct 31, 1924 Today's Date: 12/12/2012 Time: 1478-2956 PT Time Calculation (min): 19 min  PT Assessment / Plan / Recommendation History of Present Illness  s/p R TKA  Clinical Impression  Pt will benefit from PT in acute setting to address deficits below; Pt is planning for STSNF; Encouraged pt to call when ready to go back to bed, daughter present; Pt denies being uncomfortable or other issues at time of PT departure;  Activity limited due to elevated BP, of which RN aware (187/65 after up to chair)    PT Assessment  Patient needs continued PT services    Follow Up Recommendations  SNF    Does the patient have the potential to tolerate intense rehabilitation      Barriers to Discharge        Equipment Recommendations  None recommended by PT    Recommendations for Other Services     Frequency 7X/week    Precautions / Restrictions Precautions Required Braces or Orthoses: Knee Immobilizer - Right Knee Immobilizer - Right: Discontinue once straight leg raise with < 10 degree lag Restrictions Other Position/Activity Restrictions: WBAT   Pertinent Vitals/Pain Min c/o right knee pain, pt was premedicated      Mobility  Bed Mobility Bed Mobility: Supine to Sit Supine to Sit: 4: Min assist Details for Bed Mobility Assistance: verbal cues for technique Transfers Transfers: Sit to Stand;Stand to Sit;Stand Pivot Transfers Sit to Stand: 4: Min assist Stand to Sit: 4: Min Multimedia programmer Transfers: 4: Min assist Details for Transfer Assistance: verbal cues for hand placement and LLE position Ambulation/Gait Ambulation/Gait Assistance: Not tested (comment)    Exercises Total Joint Exercises Ankle Circles/Pumps: AROM;Both;10 reps Quad Sets: AROM;Both;10 reps   PT Diagnosis: Difficulty walking  PT Problem List: Decreased strength;Decreased range of motion;Decreased activity  tolerance;Decreased balance;Decreased mobility;Decreased knowledge of use of DME;Decreased knowledge of precautions PT Treatment Interventions: Functional mobility training;Gait training;DME instruction;Therapeutic activities;Therapeutic exercise;Patient/family education     PT Goals(Current goals can be found in the care plan section) Acute Rehab PT Goals Patient Stated Goal: to get back to gardening PT Goal Formulation: With patient Time For Goal Achievement: 12/19/12 Potential to Achieve Goals: Good  Visit Information  Last PT Received On: 12/12/12 Assistance Needed: +1 History of Present Illness: s/p R TKA       Prior Functioning  Home Living Family/patient expects to be discharged to:: Skilled nursing facility Living Arrangements: Alone Additional Comments: [place] Prior Function Level of Independence: Psychologist, counselling Arousal/Alertness: Awake/alert Behavior During Therapy: WFL for tasks assessed/performed Overall Cognitive Status: Within Functional Limits for tasks assessed    Extremity/Trunk Assessment Lower Extremity Assessment Lower Extremity Assessment: RLE deficits/detail RLE Deficits / Details: ankle WFL;  able to assist with SLR   Balance    End of Session PT - End of Session Equipment Utilized During Treatment: Right knee immobilizer;Gait belt Activity Tolerance: Patient tolerated treatment well Patient left: in chair;with call bell/phone within reach;with family/visitor present CPM Right Knee CPM Right Knee: Off  GP     [facility] 12/12/2012, 5:36 PM

## 2012-12-12 NOTE — Interval H&P Note (Signed)
History and Physical Interval Note:  12/12/2012 9:38 AM  [name] [name]  has presented today for surgery, with the diagnosis of OSTEOARTHRITIS RIGHT KNEE  The various methods of treatment have been discussed with the patient and family. After consideration of risks, benefits and other options for treatment, the patient has consented to  Procedure(s): RIGHT TOTAL KNEE ARTHROPLASTY (Right) as a surgical intervention .  The patient's history has been reviewed, patient examined, no change in status, stable for surgery.  I have reviewed the patient's chart and labs.  Questions were answered to the patient's satisfaction.     Loanne Drilling

## 2012-12-12 NOTE — H&P (View-Only) (Signed)
[name] [name]  DOB: 03/20/1925 Married / Language: English / Race: White Female  Date of Admission:  12/12/2012  Chief Complaint:  Right Knee Pain  History of Present Illness The patient is a 79 year old female who comes in for a preoperative History and Physical. The patient is scheduled for a right total knee arthroplasty to be performed by Dr. Frank V. Aluisio, MD at [facility] on 12/12/2012. The patient is being followed for their right knee pain. They are now 6 week(s) out from cortisone injection. Symptoms reported today include: pain. The patient feels that they are doing poorly. The patient has reported improvement of their symptoms with: Cortisone injections (did not help at all). She is ready now to get the right knee fixed. Her left knee continues to progress and now ready to proceed with the other side. They have been treated conservatively in the past for the above stated problem and despite conservative measures, they continue to have progressive pain and severe functional limitations and dysfunction. They have failed non-operative management including home exercise, medications, and injections. It is felt that they would benefit from undergoing total joint replacement. Risks and benefits of the procedure have been discussed with the patient and they elect to proceed with surgery. There are no active contraindications to surgery such as ongoing infection or rapidly progressive neurological disease.   Problem List S/P Left total knee arthroplasty (V43.65) Primary osteoarthritis of one knee (715.16). right   Allergies Sulfa Drugs. Hives. Buprenex *ANALGESICS - OPIOID*. Tachycardia Hydrocodone-Acetaminophen *ANALGESICS - OPIOID* Levaquin *FLUOROQUINOLONES* Zetia *ANTIHYPERLIPIDEMICS*   Family History Rheumatoid Arthritis. grandmother mothers side Cerebrovascular Accident. mother Heart Disease. father and brother Cancer.  brother Hypertension. mother   Social History Post-Surgical Plans. Plan for [place] Plan Drug/Alcohol Rehab (Previously). no Alcohol use. never consumed alcohol Drug/Alcohol Rehab (Currently). no Living situation. live alone Tobacco use. never smoker Current work status. retired Exercise. Exercises weekly; does other Marital status. widowed Illicit drug use. no Pain Contract. no Children. 3   Medication History PARoxetine HCl (20MG Tablet, Oral) Active. PrednisoLONE Acetate (1% Suspension, Ophthalmic) Active. Aspirin EC (81MG Tablet DR, Oral) Active. Osteo-Bi-Flex ( Oral) Specific dose unknown - Active. Multivitamin ( Oral) Active.  Past Surgical History Gallbladder Surgery. Date: 1994. laporoscopic Hysterectomy. Date: 1985. complete (non-cancerous) Cataract Extraction-Bilateral Tonsillectomy. Date: 1942.   Medical History Urinary Frequency History of Tachycardia Hypoglycemia Left Eye Blindness Osteoporosis Osteoarthritis State, symptomatic menopause/fem climacteric (627.2). 10/01/2003   Review of Systems General:Not Present- Chills, Fever, Night Sweats, Fatigue, Weight Gain, Weight Loss and Memory Loss. Skin:Not Present- Hives, Itching, Rash, Eczema and Lesions. HEENT:Not Present- Tinnitus, Headache, Double Vision, Visual Loss, Hearing Loss and Dentures. Respiratory:Not Present- Shortness of breath with exertion, Shortness of breath at rest, Allergies, Coughing up blood and Chronic Cough. Cardiovascular:Not Present- Chest Pain, Racing/skipping heartbeats, Difficulty Breathing Lying Down, Murmur, Swelling and Palpitations. Gastrointestinal:Not Present- Bloody Stool, Heartburn, Abdominal Pain, Vomiting, Nausea, Constipation, Diarrhea, Difficulty Swallowing, Jaundice and Loss of appetitie. Female Genitourinary:Present- Urinary frequency. Not Present- Blood in Urine, Weak urinary stream, Discharge, Flank Pain, Incontinence, Painful Urination,  Urgency, Urinary Retention and Urinating at Night. Musculoskeletal:Present- Joint Pain. Not Present- Muscle Weakness, Muscle Pain, Joint Swelling, Back Pain, Morning Stiffness and Spasms. Neurological:Not Present- Tremor, Dizziness, Blackout spells, Paralysis, Difficulty with balance and Weakness. Psychiatric:Not Present- Insomnia.   Vitals Pulse: 68 (Regular) Resp.: 14 (Unlabored) BP: 138/72 (Sitting, Right Arm, Standard)    Physical Exam The physical exam findings are as follows:  Note: Pateint is an 79   year old female with continued bilateral knee pain. Patient is accompanied today by her son, Steve.   General Mental Status - Alert, cooperative and good historian. General Appearance- pleasant. Not in acute distress. Orientation- Oriented X3. Build & Nutrition- Well nourished and Well developed.   Head and Neck Head- normocephalic, atraumatic . Neck Global Assessment- supple. no bruit auscultated on the right and no bruit auscultated on the left.   Eye Vision- Wears corrective lenses. Pupil- Bilateral- Regular (right eye) and Round (right eye). Motion- Bilateral- EOMI.   Chest and Lung Exam Auscultation: Breath sounds:- clear at anterior chest wall and - clear at posterior chest wall. Adventitious sounds:- No Adventitious sounds.   Cardiovascular Auscultation:Rhythm- Regular rate and rhythm. Heart Sounds- S1 WNL and S2 WNL. Murmurs & Other Heart Sounds:Auscultation of the heart reveals - No Murmurs.   Abdomen Palpation/Percussion:Tenderness- Abdomen is non-tender to palpation. Rigidity (guarding)- Abdomen is soft. Auscultation:Auscultation of the abdomen reveals - Bowel sounds normal.   Female Genitourinary Not done, not pertinent to present illness  Musculoskeletal  On exam, she is a well-developed female, alert and oriented, in no apparent distress. Evaluation of her hips shows normal ROM, no discomfort. Her left  knee shows no effusion. ROM on the left is about 5-125 with no tenderness or instability. Right knee shows no effusion. Range is about 5-120. Marked crepitus on ROM. Varus deformity. Tender medial greater than lateral with no instability noted.  RADIOGRAPHS: AP of both knees and lateral show advanced arthritic changes, right worse than left knee, bone on bone medial and patellofemoral.  Assessment & Plan Primary osteoarthritis of one knee (715.16) Story: right  Note: Plan is for a Right Total Knee Replacement by Dr. Aluisio.  Plan is to go to SNF.  PCP - Dr. Donna Gates - Patient has been seen preoperatively and felt to be stable for surgery.  The patient does not have any contraindications and will recieve TXA (tranexamic acid) prior to surgery.  Signed electronically by Alexzandrew L Perkins, III PA-C  

## 2012-12-12 NOTE — Anesthesia Procedure Notes (Signed)
Spinal  Patient location during procedure: OR Staffing Performed by: anesthesiologist  Preanesthetic Checklist Completed: patient identified, site marked, surgical consent, pre-op evaluation, timeout performed, IV checked, risks and benefits discussed and monitors and equipment checked Spinal Block Patient position: sitting Prep: Betadine Patient monitoring: heart rate, continuous pulse ox and blood pressure Injection technique: single-shot Needle Needle type: Spinocan  Needle gauge: 22 G Needle length: 9 cm Additional Notes Expiration date of kit checked and confirmed. Patient tolerated procedure well, without complications.     

## 2012-12-13 ENCOUNTER — Encounter (HOSPITAL_COMMUNITY): Payer: Self-pay | Admitting: Orthopedic Surgery

## 2012-12-13 LAB — CBC
HCT: 37.7 % (ref 36.0–46.0)
Hemoglobin: 12.3 g/dL (ref 12.0–15.0)
MCH: 30.1 pg (ref 26.0–34.0)
MCHC: 32.6 g/dL (ref 30.0–36.0)
MCV: 92.4 fL (ref 78.0–100.0)
RBC: 4.08 MIL/uL (ref 3.87–5.11)

## 2012-12-13 LAB — BASIC METABOLIC PANEL
BUN: 12 mg/dL (ref 6–23)
CO2: 28 mEq/L (ref 19–32)
Calcium: 8.7 mg/dL (ref 8.4–10.5)
Creatinine, Ser: 0.55 mg/dL (ref 0.50–1.10)
GFR calc non Af Amer: 83 mL/min — ABNORMAL LOW (ref >90)
Glucose, Bld: 195 mg/dL — ABNORMAL HIGH (ref 70–99)

## 2012-12-13 NOTE — Progress Notes (Signed)
Physical Therapy Treatment Patient Details Name: [name] [name] MRN: [id number] DOB: 1925/01/17 Today's Date: 12/13/2012 Time: 4098-1191 PT Time Calculation (min): 29 min  PT Assessment / Plan / Recommendation  History of Present Illness s/p R TKA   PT Comments   POD # 1 am session.  Instructed pt on KI use for amb.  Assisted out of recliner to amb in hallway then performed TKR TE's followed by ICE.     Follow Up Recommendations  SNF     Does the patient have the potential to tolerate intense rehabilitation     Barriers to Discharge        Equipment Recommendations  None recommended by PT    Recommendations for Other Services    Frequency 7X/week   Progress towards PT Goals Progress towards PT goals: Progressing toward goals  Plan      Precautions / Restrictions Precautions Precautions: Knee Precaution Comments: Instructed pt on KI use for amb Required Braces or Orthoses: Knee Immobilizer - Right Knee Immobilizer - Right: Discontinue once straight leg raise with < 10 degree lag Restrictions Weight Bearing Restrictions: No Other Position/Activity Restrictions: WBAT    Pertinent Vitals/Pain C/o 5/10 during TE's ICE applied    Mobility  Bed Mobility Bed Mobility: Not assessed Details for Bed Mobility Assistance: Pt OOB in recliner Transfers Transfers: Sit to Stand;Stand to Sit Sit to Stand: 4: Min assist;4: Min guard;From chair/3-in-1 Stand to Sit: 4: Min guard;4: Min assist;To chair/3-in-1 Details for Transfer Assistance: 50% Vc's on proper tech and hand placement Ambulation/Gait Ambulation/Gait Assistance: 4: Min assist;4: Min Government social research officer (Feet): 38 Feet Assistive device: Rolling walker Ambulation/Gait Assistance Details: 25% VC's on proper sequencing and proper walker to self distance. Gait Pattern: Step-to pattern;Decreased stance time - right Gait velocity: decreased    Exercises   Total Knee Replacement TE's 10 reps B LE ankle pumps 10 reps  knee presses 10 reps heel slides  10 reps SAQ's 10 reps SLR's 10 reps ABD Followed by ICE    PT Goals (current goals can now be found in the care plan section)    Visit Information  Last PT Received On: 12/13/12 Assistance Needed: +1 History of Present Illness: s/p R TKA    Subjective Data      Cognition       Balance     End of Session PT - End of Session Equipment Utilized During Treatment: Right knee immobilizer;Gait belt Activity Tolerance: Patient tolerated treatment well Patient left: in chair;with call bell/phone within reach;with family/visitor present   Felecia Shelling  PTA [facility]  Acute  Rehab Pager      [phone]

## 2012-12-13 NOTE — Progress Notes (Signed)
   Subjective: 1 Day Post-Op Procedure(s) (LRB): RIGHT TOTAL KNEE ARTHROPLASTY (Right) Patient reports pain as mild.   Patient seen in rounds with Dr. Lequita Halt.  Family in room. Patient is well, and has had no acute complaints or problems We will start therapy today.  Plan is to go Skilled nursing facility after hospital stay.  Objective: Vital signs in last 24 hours: Temp:  [97.5 F (36.4 C)-98.7 F (37.1 C)] 97.6 F (36.4 C) (09/23 1191) Pulse Rate:  [63-78] 68 (09/23 0632) Resp:  [12-18] 18 (09/22 2145) BP: (97-190)/(57-99) 97/66 mmHg (09/23 0632) SpO2:  [94 %-100 %] 98 % (09/23 4782) Weight:  [76.771 kg (169 lb 4 oz)] 76.771 kg (169 lb 4 oz) (09/22 1315)  Intake/Output from previous day:  Intake/Output Summary (Last 24 hours) at 12/13/12 0755 Last data filed at 12/13/12 0634  Gross per 24 hour  Intake 3533.42 ml  Output   5035 ml  Net -1501.58 ml    Intake/Output this shift:    Labs:  Recent Labs  12/13/12 0359  HGB 12.3    Recent Labs  12/13/12 0359  WBC 10.9*  RBC 4.08  HCT 37.7  PLT 179    Recent Labs  12/13/12 0359  NA 136  K 4.0  CL 102  CO2 28  BUN 12  CREATININE 0.55  GLUCOSE 195*  CALCIUM 8.7   No results found for this basename: LABPT, INR,  in the last 72 hours  EXAM General - Patient is Alert, Appropriate and Oriented Extremity - Neurovascular intact Sensation intact distally Dorsiflexion/Plantar flexion intact Dressing - dressing C/D/I Motor Function - intact, moving foot and toes well on exam.  Hemovac pulled without difficulty.  Past Medical History  Diagnosis Date  . Arthritis   . Blind left eye   . Dysrhythmia   . Varicose veins     both legs and left fooot at arch  . Complication of anesthesia     sensitive to meds, bp can fall  . SVT (supraventricular tachycardia) 12/15/2011    Assessment/Plan: 1 Day Post-Op Procedure(s) (LRB): RIGHT TOTAL KNEE ARTHROPLASTY (Right) Active Problems:   * No active hospital  problems. *  Estimated body mass index is 30.95 kg/(m^2) as calculated from the following:   Height as of this encounter: 5\' 2"  (1.575 m).   Weight as of this encounter: 76.771 kg (169 lb 4 oz). Advance diet Up with therapy Discharge to SNF  DVT Prophylaxis - Xarelto Weight-Bearing as tolerated to right leg No vaccines. D/C O2 and Pulse OX and try on Room [address]  [name] [name] 12/13/2012, 7:55 AM

## 2012-12-13 NOTE — Progress Notes (Addendum)
Clinical Social Work Department CLINICAL SOCIAL WORK PLACEMENT NOTE 12/13/2012  Patient:  [name] [name], [name] [name]  Account Number:  [id number] Admit date:  12/12/2012  Clinical Social Worker:  [name], LCSW  Date/time:  12/13/2012 10:55 AM  Clinical Social Work is seeking post-discharge placement for this patient at the following level of care:   SKILLED NURSING   (*CSW will update this form in Epic as items are completed)     Patient/family provided with [facility] Department of Clinical Social Work's list of facilities offering this level of care within the geographic area requested by the patient (or if unable, by the patient's family).  12/13/2012  Patient/family informed of their freedom to choose among providers that offer the needed level of care, that participate in Medicare, Medicaid or managed care program needed by the patient, have an available bed and are willing to accept the patient.    Patient/family informed of [facility]' ownership interest in [facility], as well as of the fact that they are under no obligation to receive care at this facility.  PASARR submitted to EDS on  PASARR number received from EDS on 06/28/2012  FL2 transmitted to all facilities in geographic area requested by pt/family on  12/13/2012 FL2 transmitted to all facilities within larger geographic area on   Patient informed that his/her managed care company has contracts with or will negotiate with  certain facilities, including the following:     Patient/family informed of bed offers received:  12/13/2012 Patient chooses bed at [facility] Physician recommends and patient chooses bed at    Patient to be transferred to [facility] on  12/15/12 Patient to be transferred to facility by [facility]  The following physician request were entered in Epic:   Additional Comments:  [name] LCSW [phone]

## 2012-12-13 NOTE — Care Management Note (Signed)
    Page 1 of 1   12/13/2012     12:26:06 PM   CARE MANAGEMENT NOTE 12/13/2012  Patient:  Manganaro,Georgianne   Account Number:  [id number]  Date Initiated:  12/13/2012  Documentation initiated by:  Colleen Can  Subjective/Objective Assessment:   dx: Osteoarthritis right knee; total knee replacemnt     Action/Plan:   Plans are for SNF rehab.   Anticipated DC Date:  12/15/2012   Anticipated DC Plan:  SKILLED NURSING FACILITY  In-house referral  Clinical Social Worker      DC Planning Services  CM consult      Choice offered to / List presented to:             Status of service:  Completed, signed off Medicare Important Message given?  NA - LOS <3 / Initial given by admissions (If response is "NO", the following Medicare IM given date fields will be blank) Date Medicare IM given:   Date Additional Medicare IM given:    Discharge Disposition:    Per UR Regulation:    If discussed at Long Length of Stay Meetings, dates discussed:    Comments:

## 2012-12-13 NOTE — Progress Notes (Signed)
OT Cancellation Note  Patient Details Name: Bobette Leyh MRN: [id number] DOB: 01-24-1925   Cancelled Treatment:    Reason Eval/Treat Not Completed: Other (comment) Pt just returned to bed with nursing tech assist. Requests OT try back later today. Will reattempt as schedule allows.  Lennox Laity [phone] 12/13/2012, 11:39 AM

## 2012-12-13 NOTE — Progress Notes (Signed)
Utilization review completed.  

## 2012-12-13 NOTE — Progress Notes (Signed)
Clinical Social Work Department BRIEF PSYCHOSOCIAL ASSESSMENT 12/13/2012  Patient:  [name] [name], [name] [name]     Account Number:  [id number]     Admit date:  12/12/2012  Clinical Social Worker:  Candie Chroman  Date/Time:  12/13/2012 10:45 AM  Referred by:  Physician  Date Referred:  12/13/2012 Referred for  SNF Placement   Other Referral:   Interview type:   Other interview type:    PSYCHOSOCIAL DATA Living Status:  ALONE Admitted from facility:   Level of care:   Primary support name:  Maryjane Hurter Primary support relationship to patient:  CHILD, ADULT Degree of support available:   supportive    CURRENT CONCERNS Current Concerns  Post-Acute Placement   Other Concerns:    SOCIAL WORK ASSESSMENT / PLAN Pt is an 79 yr old female living at home prior to hospitalization. CSW met with pt and daughter to assist with d/c planning. Pt has requested ST Rehab at [facility]. SNF contacted and has confirmed ST SNF will be available  for pt when ready for d/c. CSW has requested [facility] authorization for SNF placement and ambulance transport. Decision is pending.   Assessment/plan status:  Psychosocial Support/Ongoing Assessment of Needs Other assessment/ plan:   Information/referral to community resources:   Insurance coverage for SNF placement reviewed.    PATIENT'S/FAMILY'S RESPONSE TO PLAN OF CARE: Pt is looking forward to having rehab at [facility].   [name] LCSW [phone]

## 2012-12-13 NOTE — Progress Notes (Signed)
Physical Therapy Treatment Patient Details Name: [name] [name] MRN: [id number] DOB: [birth year] Today's Date: 12/13/2012 Time: 8295-6213 PT Time Calculation (min): 30 min  PT Assessment / Plan / Recommendation  History of Present Illness s/p R TKA   PT Comments   POD # 1 pm session.  Assisted pt OOB to amb to BR to void then amb in hallway increased distance.  Assisted back to bed for CPM.   Follow Up Recommendations  SNF     Does the patient have the potential to tolerate intense rehabilitation     Barriers to Discharge        Equipment Recommendations  None recommended by PT    Recommendations for Other Services    Frequency 7X/week   Progress towards PT Goals Progress towards PT goals: Progressing toward goals  Plan      Precautions / Restrictions Precautions Precautions: Knee Precaution Comments: Instructed pt on KI use for amb Required Braces or Orthoses: Knee Immobilizer - Right Knee Immobilizer - Right: Discontinue once straight leg raise with < 10 degree lag Restrictions Weight Bearing Restrictions: No Other Position/Activity Restrictions: WBAT    Pertinent Vitals/Pain C/o 5/10 during gait ICE applied    Mobility  Bed Mobility Bed Mobility: Sit to Supine Sit to Supine: 4: Min assist Details for Bed Mobility Assistance: assisted back to bed for CPM Transfers Transfers: Sit to Stand;Stand to Sit Sit to Stand: 4: Min assist;4: Min guard;From chair/3-in-1;From toilet Stand to Sit: 4: Min guard;4: Min assist;To bed;To toilet Details for Transfer Assistance: 25% Vc's on proper tech and hand placement Ambulation/Gait Ambulation/Gait Assistance: 4: Min assist;4: Min guard Ambulation Distance (Feet): 46 Feet Assistive device: Rolling walker Ambulation/Gait Assistance Details: 25% VC's on safety with turns and backward gait sequencing/completion Gait Pattern: Step-to pattern;Decreased stance time - right Gait velocity: decreased    PT Goals (current goals can  now be found in the care plan section)    Visit Information  Last PT Received On: 12/13/12 Assistance Needed: +1 History of Present Illness: s/p R TKA    Subjective Data      Cognition       Balance     End of Session PT - End of Session Equipment Utilized During Treatment: Right knee immobilizer;Gait belt Activity Tolerance: Patient tolerated treatment well Patient left: in bed;with call bell/phone within reach;with family/visitor present   Felecia Shelling  PTA [facility]  Acute  Rehab Pager      [phone]

## 2012-12-13 NOTE — Progress Notes (Signed)
Occupational Therapy Evaluation Patient Details Name: [name] [name] MRN: [id number] DOB: 04/01/1924 Today's Date: 12/13/2012 Time: 5784-6962 OT Time Calculation (min): 24 min  OT Assessment / Plan / Recommendation History of present illness s/p R TKA   Clinical Impression   PAtient presents to OT with decreased ADL independence and safety after R TKR. Will benefit from OT to maximize independence.    OT Assessment  Patient needs continued OT Services    Follow Up Recommendations  SNF    Barriers to Discharge Decreased caregiver support lives alone  Equipment Recommendations  Other (comment) (tbd at SNF)    Recommendations for Other Services    Frequency  Min 2X/week    Precautions / Restrictions Precautions Precautions: Knee Precaution Comments: Instructed pt on KI use for amb Required Braces or Orthoses: Knee Immobilizer - Right Knee Immobilizer - Right: Discontinue once straight leg raise with < 10 degree lag Restrictions Weight Bearing Restrictions: No Other Position/Activity Restrictions: WBAT   Pertinent Vitals/Pain C/o pain back of knee; ice pack placed for comfort.    ADL  Grooming: Wash/dry hands;Min guard Where Assessed - Grooming: Unsupported standing Lower Body Bathing: Maximal assistance Where Assessed - Lower Body Bathing: Supported sit to stand Lower Body Dressing: Maximal assistance Where Assessed - Lower Body Dressing: Supported sit to Pharmacist, hospital: Hydrographic surveyor Method: Sit to stand;Stand Wellsite geologist: Raised toilet seat with arms (or 3-in-1 over toilet) Toileting - Clothing Manipulation and Hygiene: Moderate assistance Where Assessed - Toileting Clothing Manipulation and Hygiene: Sit to stand from 3-in-1 or toilet Equipment Used: Knee Immobilizer;Rolling walker Transfers/Ambulation Related to ADLs: Patient required cues to slow down with all mobility. Min guard A overall. ADL Comments: Pt ambulated to/from  bathroom with RW. Had difficulty with clothing mgmt. Educated on side stepping with RW in bathroom small spaces and along EOB.    OT Diagnosis: Generalized weakness;Acute pain  OT Problem List: Decreased strength;Decreased activity tolerance;Pain;Decreased knowledge of use of DME or AE;Decreased knowledge of precautions;Decreased safety awareness OT Treatment Interventions: Self-care/ADL training;DME and/or AE instruction;Therapeutic activities;Patient/family education   OT Goals(Current goals can be found in the care plan section) Acute Rehab OT Goals Patient Stated Goal: to get back to gardening OT Goal Formulation: With patient Time For Goal Achievement: 12/27/12 Potential to Achieve Goals: Good  Visit Information  Last OT Received On: 12/13/12 Assistance Needed: +1 Reason Eval/Treat Not Completed: Other (comment) History of Present Illness: s/p R TKA       Prior Functioning     Home Living Family/patient expects to be discharged to:: Skilled nursing facility Living Arrangements: Alone Additional Comments: [place] Prior Function Level of Independence: Independent Comments: blind l eye. pt was independent prior to l tka ~2 weeks ago. returned to hospital from [facility] this admission Communication Communication: [facility]         Vision/Perception     Cognition  Cognition Arousal/Alertness: Awake/alert Behavior During Therapy: WFL for tasks assessed/performed Overall Cognitive Status: Within Functional Limits for tasks assessed    Extremity/Trunk Assessment Upper Extremity Assessment Upper Extremity Assessment: Overall WFL for tasks assessed Lower Extremity Assessment Lower Extremity Assessment: Defer to PT evaluation     Mobility Bed Mobility Bed Mobility: Sit to Supine Sit to Supine: 4: Min assist Details for Bed Mobility Assistance: assisted back to bed for CPM Transfers Sit to Stand: 4: Min assist;4: Min guard;From chair/3-in-1;From toilet Stand to  Sit: 4: Min guard;4: Min assist;To bed;To toilet Details for Transfer Assistance: 25% Vc's  on proper tech and hand placement     GO     [name] [name] A 12/13/2012, 3:38 PM

## 2012-12-14 LAB — CBC
Hemoglobin: 11.7 g/dL — ABNORMAL LOW (ref 12.0–15.0)
MCH: 30 pg (ref 26.0–34.0)
MCHC: 32.6 g/dL (ref 30.0–36.0)
MCV: 92.1 fL (ref 78.0–100.0)
Platelets: 160 10*3/uL (ref 150–400)
RBC: 3.9 MIL/uL (ref 3.87–5.11)
RDW: 14.5 % (ref 11.5–15.5)

## 2012-12-14 LAB — BASIC METABOLIC PANEL
BUN: 13 mg/dL (ref 6–23)
CO2: 29 mEq/L (ref 19–32)
Calcium: 8.9 mg/dL (ref 8.4–10.5)
Creatinine, Ser: 0.52 mg/dL (ref 0.50–1.10)
GFR calc non Af Amer: 85 mL/min — ABNORMAL LOW (ref >90)
Sodium: 138 mEq/L (ref 135–145)

## 2012-12-14 NOTE — Progress Notes (Signed)
CSW assisting with d/c planning. [facility] has a SNF bed for pt [facility] . [employer] has provided authorization for SNF and ambulance transport. CSW will assist with d/c planning to SNF when stable.  [name] LCSW

## 2012-12-14 NOTE — Progress Notes (Signed)
Physical Therapy Treatment Patient Details Name: [name] [name] MRN: [id number] DOB: May 09, 1924 Today's Date: 12/14/2012 Time: 1630-1700 PT Time Calculation (min): 30 min  PT Assessment / Plan / Recommendation  History of Present Illness s/p R TKA   PT Comments   Progressing with mobility. Plan is for d/c to [facility] on tomorrow.   Follow Up Recommendations  SNF     Does the patient have the potential to tolerate intense rehabilitation     Barriers to Discharge        Equipment Recommendations  None recommended by PT    Recommendations for Other Services    Frequency 7X/week   Progress towards PT Goals Progress towards PT goals: Progressing toward goals  Plan Current plan remains appropriate    Precautions / Restrictions Precautions Precautions: Knee Required Braces or Orthoses: Knee Immobilizer - Right Knee Immobilizer - Right: Discontinue once straight leg raise with < 10 degree lag Restrictions Weight Bearing Restrictions: No RLE Weight Bearing: Weight bearing as tolerated   Pertinent Vitals/Pain 5/10 r knee with activity. Ice applied end of session    Mobility  Bed Mobility Bed Mobility: Supine to Sit;Sit to Supine Supine to Sit: 4: Min assist Sit to Supine: 4: Min assist Details for Bed Mobility Assistance: Increased time. assist for R LE off/onto bed.  Transfers Transfers: Sit to Stand;Stand to Sit Sit to Stand: 4: Min assist;From bed Stand to Sit: 4: Min assist;To bed Details for Transfer Assistance: VCS safety, technique, hand/R LE placement. Assist to rise, stabilize, control descent.  Ambulation/Gait Ambulation/Gait Assistance: 4: Min assist Ambulation Distance (Feet): 75 Feet Assistive device: Rolling walker Ambulation/Gait Assistance Details: Pt with increased pain this session, so shortened ambulatin distance slightly. Slow gait speed. assist to stabilize intermittently Gait Pattern: Step-to pattern;Antalgic;Trunk flexed;Decreased stance time - right     Exercises Total Joint Exercises Ankle Circles/Pumps: AROM;Both;10 reps;Supine Quad Sets: AROM;Both;10 reps;Supine Heel Slides: AAROM;Right;10 reps;Supine Hip ABduction/ADduction: AAROM;Right;10 reps;Supine Straight Leg Raises: AAROM;Right;10 reps;Supine Goniometric ROM: 10-55 degrees   PT Diagnosis:    PT Problem List:   PT Treatment Interventions:     PT Goals (current goals can now be found in the care plan section)    Visit Information  Last PT Received On: 12/14/12 Assistance Needed: +1 History of Present Illness: s/p R TKA    Subjective Data      Cognition       Balance     End of Session PT - End of Session Equipment Utilized During Treatment: Right knee immobilizer Activity Tolerance: Patient limited by pain Patient left: in bed;with call bell/phone within reach   GP     [name] [name] Alert, MPT Pager: [phone]

## 2012-12-14 NOTE — Progress Notes (Addendum)
Physical Therapy Treatment Patient Details Name: [name] [name] MRN: [id number] DOB: 29-May-1924 Today's Date: 12/14/2012 Time: 7846-9629 PT Time Calculation (min): 29 min  PT Assessment / Plan / Recommendation  History of Present Illness s/p R TKA   PT Comments   POD # 2 am session.  Assisted pt OOB.  Amb in hallway with KI as pt was c/o increased "soreness".  Assisted to [facility] then to recliner and then applied ICE.  Pt plans to D/C to [facility].    Follow Up Recommendations  SNF     Does the patient have the potential to tolerate intense rehabilitation     Barriers to Discharge        Equipment Recommendations       Recommendations for Other Services    Frequency 7X/week   Progress towards PT Goals Progress towards PT goals: Progressing toward goals  Plan      Precautions / Restrictions Precautions Precautions: Knee Precaution Comments: Instructed pt on KI use for amb Required Braces or Orthoses: Knee Immobilizer - Right Knee Immobilizer - Right: Discontinue once straight leg raise with < 10 degree lag Restrictions Weight Bearing Restrictions: No Other Position/Activity Restrictions: WBAT    Pertinent Vitals/Pain C/o "sorness" ICE applied Pre medicated    Mobility  Bed Mobility Bed Mobility: Supine to Sit Supine to Sit: 3: Mod assist Details for Bed Mobility Assistance: mod assist to support R LE and increased time due to increased c/o pain today Transfers Transfers: Sit to Stand;Stand to Sit Sit to Stand: 4: Min assist;4: Min guard;From toilet;From bed Stand to Sit: 4: Min guard;4: Min assist;To toilet;To chair/3-in-1 Details for Transfer Assistance: 25% Vc's on proper tech and hand placement Ambulation/Gait Ambulation/Gait Assistance: 4: Min assist;4: Min guard Ambulation Distance (Feet): 85 Feet Assistive device: Rolling walker Ambulation/Gait Assistance Details: <25% VC's on safety during turns and increased time Gait Pattern: Step-to pattern;Decreased  stance time - right Gait velocity: decreased         PT Goals (current goals can now be found in the care plan section)    Visit Information  Last PT Received On: 12/14/12 Assistance Needed: +1 History of Present Illness: s/p R TKA    Subjective Data      Cognition       Balance     End of Session PT - End of Session Equipment Utilized During Treatment: Right knee immobilizer;Gait belt Activity Tolerance: Patient tolerated treatment well Patient left: in chair;with call bell/phone within reach;with family/visitor present   Felecia Shelling  PTA [facility]  Acute  Rehab Pager      [phone]

## 2012-12-14 NOTE — Progress Notes (Signed)
   Subjective: 2 Days Post-Op Procedure(s) (LRB): RIGHT TOTAL KNEE ARTHROPLASTY (Right) Patient reports pain as mild.   Patient seen in rounds by Dr. Lequita Halt. Patient is well, and has had no acute complaints or problems Plan is to go Skilled nursing facility after hospital stay.  Objective: Vital signs in last 24 hours: Temp:  [97.9 F (36.6 C)-98.4 F (36.9 C)] 97.9 F (36.6 C) (09/24 1315) Pulse Rate:  [66-67] 66 (09/24 0545) Resp:  [16] 16 (09/24 1315) BP: (133-168)/(68-70) 168/69 mmHg (09/24 1700) SpO2:  [94 %-96 %] 96 % (09/24 1700)  Intake/Output from previous day:  Intake/Output Summary (Last 24 hours) at 12/14/12 1728 Last data filed at 12/14/12 1300  Gross per 24 hour  Intake    720 ml  Output    750 ml  Net    -30 ml    Intake/Output this shift: Total I/O In: 480 [P.O.:480] Out: -   Labs:  Recent Labs  12/13/12 0359 12/14/12 0405  HGB 12.3 11.7*    Recent Labs  12/13/12 0359 12/14/12 0405  WBC 10.9* 10.8*  RBC 4.08 3.90  HCT 37.7 35.9*  PLT 179 160    Recent Labs  12/13/12 0359 12/14/12 0405  NA 136 138  K 4.0 4.2  CL 102 104  CO2 28 29  BUN 12 13  CREATININE 0.55 0.52  GLUCOSE 195* 141*  CALCIUM 8.7 8.9   No results found for this basename: LABPT, INR,  in the last 72 hours  EXAM General - Patient is Alert, Appropriate and Oriented Extremity - Neurovascular intact Sensation intact distally Dorsiflexion/Plantar flexion intact No cellulitis present Dressing/Incision - clean, dry, no drainage, healing Motor Function - intact, moving foot and toes well on exam.   Past Medical History  Diagnosis Date  . Arthritis   . Blind left eye   . Dysrhythmia   . Varicose veins     both legs and left fooot at arch  . Complication of anesthesia     sensitive to meds, bp can fall  . SVT (supraventricular tachycardia) 12/15/2011    Assessment/Plan: 2 Days Post-Op Procedure(s) (LRB): RIGHT TOTAL KNEE ARTHROPLASTY (Right) Active  Problems:   * No active hospital problems. *  Estimated body mass index is 30.95 kg/(m^2) as calculated from the following:   Height as of this encounter: 5\' 2"  (1.575 m).   Weight as of this encounter: 76.771 kg (169 lb 4 oz). Plan for discharge tomorrow Discharge to SNF  DVT Prophylaxis - Xarelto Weight-Bearing as tolerated to right leg  PERKINS, ALEXZANDREW 12/14/2012, 5:28 PM

## 2012-12-15 ENCOUNTER — Inpatient Hospital Stay (HOSPITAL_COMMUNITY): Payer: Medicare Other

## 2012-12-15 DIAGNOSIS — D62 Acute posthemorrhagic anemia: Secondary | ICD-10-CM | POA: Diagnosis not present

## 2012-12-15 LAB — CBC
MCHC: 32.1 g/dL (ref 30.0–36.0)
MCV: 93.8 fL (ref 78.0–100.0)
Platelets: 157 10*3/uL (ref 150–400)
RBC: 3.69 MIL/uL — ABNORMAL LOW (ref 3.87–5.11)
RDW: 14.9 % (ref 11.5–15.5)
WBC: 8.3 10*3/uL (ref 4.0–10.5)

## 2012-12-15 MED ORDER — POLYETHYLENE GLYCOL 3350 17 G PO PACK: 17.0000 g | PACK | Freq: Every day | ORAL | Status: DC | PRN

## 2012-12-15 MED ORDER — GI COCKTAIL ~~LOC~~
30.0000 mL | Freq: Once | ORAL | Status: AC
  Administered 2012-12-15: 08:00:00 30 mL via ORAL
  Filled 2012-12-15: qty 30

## 2012-12-15 MED ORDER — METHOCARBAMOL 500 MG PO TABS: 500.0000 mg | ORAL_TABLET | Freq: Four times a day (QID) | ORAL | Status: DC | PRN

## 2012-12-15 MED ORDER — HYDROMORPHONE HCL 2 MG PO TABS: 2.0000 mg | ORAL_TABLET | ORAL | Status: DC | PRN

## 2012-12-15 MED ORDER — ONDANSETRON HCL 4 MG PO TABS: 4.0000 mg | ORAL_TABLET | Freq: Four times a day (QID) | ORAL | Status: DC | PRN

## 2012-12-15 MED ORDER — DSS 100 MG PO CAPS: 100.0000 mg | ORAL_CAPSULE | Freq: Two times a day (BID) | ORAL | Status: DC

## 2012-12-15 MED ORDER — RIVAROXABAN 10 MG PO TABS: 10.0000 mg | ORAL_TABLET | Freq: Every day | ORAL | Status: DC

## 2012-12-15 MED ORDER — NITROGLYCERIN 0.4 MG SL SUBL: 0.4000 mg | SUBLINGUAL_TABLET | Freq: Once | SUBLINGUAL | Status: DC

## 2012-12-15 MED ORDER — BISACODYL 10 MG RE SUPP: 10.0000 mg | Freq: Every day | RECTAL | Status: DC | PRN

## 2012-12-15 MED ORDER — METOCLOPRAMIDE HCL 5 MG PO TABS: 5.0000 mg | ORAL_TABLET | Freq: Three times a day (TID) | ORAL | Status: DC | PRN

## 2012-12-15 MED ORDER — SIMETHICONE 80 MG PO CHEW
80.0000 mg | CHEWABLE_TABLET | Freq: Once | ORAL | Status: AC
  Administered 2012-12-15: 80 mg via ORAL
  Filled 2012-12-15 (×2): qty 1

## 2012-12-15 MED ORDER — HYDROMORPHONE HCL PF 1 MG/ML IJ SOLN
0.2000 mg | INTRAMUSCULAR | Status: DC | PRN
  Administered 2012-12-15: 0.2 mg via INTRAVENOUS
  Filled 2012-12-15: qty 1

## 2012-12-15 NOTE — Progress Notes (Signed)
Clinical Social Work  CSW faxed DC summary to [employer] [employer] who is agreeable to admission today. CSW prepared DC packet with FL2 and hard scripts included. CSW informed patient and dtrs of DC plans. RN has number to call report and agreeable to plan. Patient desires [facility] to transport. CSW coordinated transportation. Request # [phone].  CSW is signing off but available if needed.  Unk Lightning, LCSW  (Coverage for [employer])

## 2012-12-15 NOTE — Progress Notes (Addendum)
Subjective: 3 Days Post-Op Procedure(s) (LRB): RIGHT TOTAL KNEE ARTHROPLASTY (Right) Patient reports pain as mild.   Patient seen in rounds by Dr. Lequita Halt. Patient is well, and has had no acute complaints or problems Patient is ready to go to [facility]  Objective: Vital signs in last 24 hours: Temp:  [97.9 F (36.6 C)-99.4 F (37.4 C)] 99.4 F (37.4 C) (09/25 0608) Pulse Rate:  [69-71] 69 (09/25 0736) Resp:  [16-20] 20 (09/25 0800) BP: (132-168)/(60-84) 154/84 mmHg (09/25 0736) SpO2:  [91 %-96 %] 92 % (09/25 7829)  Intake/Output from previous day:  Intake/Output Summary (Last 24 hours) at 12/15/12 0939 Last data filed at 12/14/12 1300  Gross per 24 hour  Intake    240 ml  Output      0 ml  Net    240 ml    Intake/Output this shift:    Labs:  Recent Labs  12/13/12 0359 12/14/12 0405 12/15/12 0410  HGB 12.3 11.7* 11.1*    Recent Labs  12/14/12 0405 12/15/12 0410  WBC 10.8* 8.3  RBC 3.90 3.69*  HCT 35.9* 34.6*  PLT 160 157    Recent Labs  12/13/12 0359 12/14/12 0405  NA 136 138  K 4.0 4.2  CL 102 104  CO2 28 29  BUN 12 13  CREATININE 0.55 0.52  GLUCOSE 195* 141*  CALCIUM 8.7 8.9   No results found for this basename: LABPT, INR,  in the last 72 hours  EXAM: General - Patient is Alert, Appropriate and Oriented Extremity - Neurovascular intact Sensation intact distally Dorsiflexion/Plantar flexion intact No cellulitis present Incision - clean, dry, no drainage, healing Motor Function - intact, moving foot and toes well on exam.   Assessment/Plan: 3 Days Post-Op Procedure(s) (LRB): RIGHT TOTAL KNEE ARTHROPLASTY (Right) Procedure(s) (LRB): RIGHT TOTAL KNEE ARTHROPLASTY (Right) Past Medical History  Diagnosis Date  . Arthritis   . Blind left eye   . Dysrhythmia   . Varicose veins     both legs and left fooot at arch  . Complication of anesthesia     sensitive to meds, bp can fall  . SVT (supraventricular tachycardia)  12/15/2011   Active Problems:   Postoperative anemia due to acute blood loss  Estimated body mass index is 30.95 kg/(m^2) as calculated from the following:   Height as of this encounter: 5\' 2"  (1.575 m).   Weight as of this encounter: 76.771 kg (169 lb 4 oz). Discharge to SNF Diet - Regular diet Follow up - in 2 weeks Activity - WBAT Disposition - Skilled nursing facility Condition Upon Discharge - Good D/C Meds - See DC Summary DVT Prophylaxis - Xarelto  Addendum - patient had some complaints of indigestion earlier this morning and was treated with oral stomach medications and then a GI cocktail.  Her discomfort improved but did not totally resolve.  It started prior to breakfast.  She had not been up with therapy yet.  She said it was a sharpe pain initially and pointed to the upper chest area.  No radiation of pain into the neck, back, or arm.  She said the pain is mild at follow visit this morning.  Daughter at bedside. Denies any previous pain like this before.  Denies any cardiac history except for history of tachycardia (SVT).  She denied any SOB or N/V associated with episode.  Discomfort continued so workup was initiated to include:  CXR, EKG, LABS.  Will give IV pain med and simethicone.  She describes the need to belch.  She feels like its an air pocket caught up high and cant get it out.   Room air O2 sats - 92% - place on Trail O2 at 2 Liters. Recent Vitals  Temp - 99.9 BP - 131/63 P - 75 and feels regular on exam R - 18  Patient appears to be anxious but not in distress.  Alert and cooperative. Await Labs before deciding further treatment or possible discharge.  EKG does not appear to have any acute changes on initial glance except for occasional PVC.  CLINICAL DATA: History of sharp chest pains. Shortness of breath.  Cough. Congestion. Postoperative.  EXAM:  CHEST - 1 VIEW  COMPARISON: 12/05/2012.  FINDINGS:  This cardiac silhouette is upper normal size. Ectasia and    nonaneurysmal calcification of the thoracic aorta are seen.  Mediastinal and hilar contours appear stable. No pulmonary  infiltrates or masses are evident. No pleural abnormality is  evident. No pneumothorax is evident. Osteophytes are present in the  spine. There are cholecystectomy clips. .  IMPRESSION:  No acute or active cardiopulmonary abnormalities are identified.  Stable chronic findings are detailed above.  Patrica Duel 12/15/2012, 9:39 AM

## 2012-12-15 NOTE — Evaluation (Signed)
Pt c/o air in mid chest with burping and burning.Dr Despina Hick notifed.Gi cocktail given and pain med for knee.Pain not relieved Stat cardiac profile done,EKGand PCXR. dILAUDID .2MG M IV GIVEN. DrAlusio and Kenard Gower pa called and notifed of lab results.States pt can be d/c to facility.today.

## 2012-12-15 NOTE — Discharge Summary (Signed)
Physician Discharge Summary   Patient ID: [name] [name] MRN: [id number] DOB/AGE: 1925-02-06 79 y.o.  Admit date: 12/12/2012 Discharge date: 9/25/214  Primary Diagnosis:  Osteoarthritis Right knee  Admission Diagnoses:  Past Medical History  Diagnosis Date  . Arthritis   . Blind left eye   . Dysrhythmia   . Varicose veins     both legs and left fooot at arch  . Complication of anesthesia     sensitive to meds, bp can fall  . SVT (supraventricular tachycardia) 12/15/2011   Discharge Diagnoses:   Active Problems:   Postoperative anemia due to acute blood loss  Estimated body mass index is 30.95 kg/(m^2) as calculated from the following:   Height as of this encounter: 5\' 2"  (1.575 m).   Weight as of this encounter: 76.771 kg (169 lb 4 oz).  Procedure:  Procedure(s) (LRB): RIGHT TOTAL KNEE ARTHROPLASTY (Right)   Consults: None  HPI: [name] [name] is a 79 y.o. year old female with end stage OA of her right knee with progressively worsening pain and dysfunction. She has constant pain, with activity and at rest and significant functional deficits with difficulties even with ADLs. She has had extensive non-op management including analgesics, injections of cortisone and viscosupplements, and home exercise program, but remains in significant pain with significant dysfunction.Radiographs show bone on bone arthritis medial and patellofemoral. She presents now for right Total Knee Arthroplasty.   Laboratory Data: Admission on 12/12/2012  Component Date Value Range Status  . ABO/RH(D) 12/12/2012 A POS   Final  . Antibody Screen 12/12/2012 NEG   Final  . Sample Expiration 12/12/2012 12/15/2012   Final  . WBC 12/13/2012 10.9* 4.0 - 10.5 K/uL Final  . RBC 12/13/2012 4.08  3.87 - 5.11 MIL/uL Final  . Hemoglobin 12/13/2012 12.3  12.0 - 15.0 g/dL Final  . HCT 40/98/1191 37.7  36.0 - 46.0 % Final  . MCV 12/13/2012 92.4  78.0 - 100.0 fL Final  . MCH 12/13/2012 30.1  26.0 - 34.0 pg Final  .  MCHC 12/13/2012 32.6  30.0 - 36.0 g/dL Final  . RDW 47/82/9562 14.1  11.5 - 15.5 % Final  . Platelets 12/13/2012 179  150 - 400 K/uL Final  . Sodium 12/13/2012 136  135 - 145 mEq/L Final  . Potassium 12/13/2012 4.0  3.5 - 5.1 mEq/L Final  . Chloride 12/13/2012 102  96 - 112 mEq/L Final  . CO2 12/13/2012 28  19 - 32 mEq/L Final  . Glucose, Bld 12/13/2012 195* 70 - 99 mg/dL Final  . BUN 13/10/6576 12  6 - 23 mg/dL Final  . Creatinine, Ser 12/13/2012 0.55  0.50 - 1.10 mg/dL Final  . Calcium 46/96/2952 8.7  8.4 - 10.5 mg/dL Final  . GFR calc non Af Amer 12/13/2012 83* >90 mL/min Final  . GFR calc Af Amer 12/13/2012 >90  >90 mL/min Final   Comment: (NOTE)                          The eGFR has been calculated using the CKD EPI equation.                          This calculation has not been validated in all clinical situations.                          eGFR's persistently <90 mL/min signify possible Chronic Kidney  Disease.  . WBC 12/14/2012 10.8* 4.0 - 10.5 K/uL Final  . RBC 12/14/2012 3.90  3.87 - 5.11 MIL/uL Final  . Hemoglobin 12/14/2012 11.7* 12.0 - 15.0 g/dL Final  . HCT 40/98/1191 35.9* 36.0 - 46.0 % Final  . MCV 12/14/2012 92.1  78.0 - 100.0 fL Final  . MCH 12/14/2012 30.0  26.0 - 34.0 pg Final  . MCHC 12/14/2012 32.6  30.0 - 36.0 g/dL Final  . RDW 47/82/9562 14.5  11.5 - 15.5 % Final  . Platelets 12/14/2012 160  150 - 400 K/uL Final  . Sodium 12/14/2012 138  135 - 145 mEq/L Final  . Potassium 12/14/2012 4.2  3.5 - 5.1 mEq/L Final  . Chloride 12/14/2012 104  96 - 112 mEq/L Final  . CO2 12/14/2012 29  19 - 32 mEq/L Final  . Glucose, Bld 12/14/2012 141* 70 - 99 mg/dL Final  . BUN 13/10/6576 13  6 - 23 mg/dL Final  . Creatinine, Ser 12/14/2012 0.52  0.50 - 1.10 mg/dL Final  . Calcium 46/96/2952 8.9  8.4 - 10.5 mg/dL Final  . GFR calc non Af Amer 12/14/2012 85* >90 mL/min Final  . GFR calc Af Amer 12/14/2012 >90  >90 mL/min Final   Comment: (NOTE)                           The eGFR has been calculated using the CKD EPI equation.                          This calculation has not been validated in all clinical situations.                          eGFR's persistently <90 mL/min signify possible Chronic Kidney                          Disease.  . WBC 12/15/2012 8.3  4.0 - 10.5 K/uL Final  . RBC 12/15/2012 3.69* 3.87 - 5.11 MIL/uL Final  . Hemoglobin 12/15/2012 11.1* 12.0 - 15.0 g/dL Final  . HCT 84/13/2440 34.6* 36.0 - 46.0 % Final  . MCV 12/15/2012 93.8  78.0 - 100.0 fL Final  . MCH 12/15/2012 30.1  26.0 - 34.0 pg Final  . MCHC 12/15/2012 32.1  30.0 - 36.0 g/dL Final  . RDW 01/17/2535 14.9  11.5 - 15.5 % Final  . Platelets 12/15/2012 157  150 - 400 K/uL Final  Hospital Outpatient Visit on 12/05/2012  Component Date Value Range Status  . aPTT 12/05/2012 27  24 - 37 seconds Final  . WBC 12/05/2012 6.1  4.0 - 10.5 K/uL Final  . RBC 12/05/2012 4.69  3.87 - 5.11 MIL/uL Final  . Hemoglobin 12/05/2012 14.1  12.0 - 15.0 g/dL Final  . HCT 64/40/3474 43.7  36.0 - 46.0 % Final  . MCV 12/05/2012 93.2  78.0 - 100.0 fL Final  . MCH 12/05/2012 30.1  26.0 - 34.0 pg Final  . MCHC 12/05/2012 32.3  30.0 - 36.0 g/dL Final  . RDW 25/95/6387 14.4  11.5 - 15.5 % Final  . Platelets 12/05/2012 200  150 - 400 K/uL Final  . Sodium 12/05/2012 139  135 - 145 mEq/L Final  . Potassium 12/05/2012 4.3  3.5 - 5.1 mEq/L Final  . Chloride 12/05/2012 102  96 - 112 mEq/L Final  . CO2 12/05/2012 31  19 - 32 mEq/L Final  . Glucose, Bld 12/05/2012 77  70 - 99 mg/dL Final  . BUN 98/01/9146 17  6 - 23 mg/dL Final  . Creatinine, Ser 12/05/2012 0.63  0.50 - 1.10 mg/dL Final  . Calcium 82/95/6213 9.1  8.4 - 10.5 mg/dL Final  . Total Protein 12/05/2012 6.5  6.0 - 8.3 g/dL Final  . Albumin 08/65/7846 3.3* 3.5 - 5.2 g/dL Final  . AST 96/29/5284 23  0 - 37 U/L Final  . ALT 12/05/2012 17  0 - 35 U/L Final  . Alkaline Phosphatase 12/05/2012 106  39 - 117 U/L Final  . Total Bilirubin  12/05/2012 0.3  0.3 - 1.2 mg/dL Final  . GFR calc non Af Amer 12/05/2012 80* >90 mL/min Final  . GFR calc Af Amer 12/05/2012 >90  >90 mL/min Final   Comment: (NOTE)                          The eGFR has been calculated using the CKD EPI equation.                          This calculation has not been validated in all clinical situations.                          eGFR's persistently <90 mL/min signify possible Chronic Kidney                          Disease.  Marland Kitchen Prothrombin Time 12/05/2012 12.9  11.6 - 15.2 seconds Final  . INR 12/05/2012 0.99  0.00 - 1.49 Final  . Color, Urine 12/05/2012 YELLOW  YELLOW Final  . APPearance 12/05/2012 CLEAR  CLEAR Final  . Specific Gravity, Urine 12/05/2012 1.023  1.005 - 1.030 Final  . pH 12/05/2012 6.0  5.0 - 8.0 Final  . Glucose, UA 12/05/2012 NEGATIVE  NEGATIVE mg/dL Final  . Hgb urine dipstick 12/05/2012 NEGATIVE  NEGATIVE Final  . Bilirubin Urine 12/05/2012 NEGATIVE  NEGATIVE Final  . Ketones, ur 12/05/2012 NEGATIVE  NEGATIVE mg/dL Final  . Protein, ur 13/24/4010 NEGATIVE  NEGATIVE mg/dL Final  . Urobilinogen, UA 12/05/2012 0.2  0.0 - 1.0 mg/dL Final  . Nitrite 27/25/3664 NEGATIVE  NEGATIVE Final  . Leukocytes, UA 12/05/2012 MODERATE* NEGATIVE Final  . MRSA, PCR 12/05/2012 NEGATIVE  NEGATIVE Final  . Staphylococcus aureus 12/05/2012 NEGATIVE  NEGATIVE Final   Comment:                                 The Xpert SA Assay (FDA                          approved for NASAL specimens                          in patients over 61 years of age),                          is one component of                          a comprehensive surveillance  program.  Test performance has                          been validated by [facility] for patients greater                          than or equal to 3 year old.                          It is not intended                          to diagnose infection nor to                           guide or monitor treatment.  . Squamous Epithelial / LPF 12/05/2012 RARE  RARE Final  . WBC, UA 12/05/2012 7-10  <3 WBC/hpf Final     X-Rays:Dg Chest 2 View  12/05/2012   CLINICAL DATA:  Preop total knee replacement.  EXAM: CHEST  2 VIEW  COMPARISON:  07/08/2012  FINDINGS: Heart and mediastinal contours are within normal limits. No focal opacities or effusions. No acute bony abnormality. Degenerative changes in the thoracic spine.  IMPRESSION: No active cardiopulmonary disease.   Electronically Signed   By: Charlett Nose M.D.   On: 12/05/2012 15:33    EKG: Orders placed in visit on 11/08/12  . EKG 12-LEAD     Hospital Course: [name] [name] is a 79 y.o. who was admitted to [facility]. They were brought to the operating room on 12/12/2012 and underwent Procedure(s): RIGHT TOTAL KNEE ARTHROPLASTY.  Patient tolerated the procedure well and was later transferred to the recovery room and then to the orthopaedic floor for postoperative care.  They were given PO and IV analgesics for pain control following their surgery.  They were given 24 hours of postoperative antibiotics of  Anti-infectives   Start     Dose/Rate Route Frequency Ordered Stop   12/12/12 1700  ceFAZolin (ANCEF) IVPB 1 g/50 mL premix     1 g 100 mL/hr over 30 Minutes Intravenous Every 6 hours 12/12/12 1331 12/12/12 2339   12/12/12 0800  ceFAZolin (ANCEF) IVPB 2 g/50 mL premix     2 g 100 mL/hr over 30 Minutes Intravenous On call to O.R. 12/12/12 1610 12/12/12 1029     and started on DVT prophylaxis in the form of Xarelto.   PT and OT were ordered for total joint protocol.  Discharge planning consulted to help with postop disposition and equipment needs.  Patient had a decent night on the evening of surgery.  They started to get up OOB with therapy on day one. Hemovac drain was pulled without difficulty.  Continued to work with therapy into day two.  Dressing was changed on day two and the incision was healing  well.  By day three, the patient had progressed with therapy and meeting their goals.  Incision was healing well.  Patient was seen in rounds and was ready to go to [facility].   Discharge Medications: Prior to Admission medications   Medication Sig Start Date End Date Taking? Authorizing Provider  nebivolol (BYSTOLIC)  5 MG tablet Take 5 mg by mouth every other day. At dinner time. Alternating with paxil   Yes Historical Provider, MD  PARoxetine (PAXIL) 10 MG tablet Take 10 mg by mouth every other day. Alternating with bystolic   Yes Historical Provider, MD  prednisoLONE acetate (PRED FORTE) 1 % ophthalmic suspension Place 1 drop into both eyes 2 (two) times daily.    Yes Historical Provider, MD  bisacodyl (DULCOLAX) 10 MG suppository Place 1 suppository (10 mg total) rectally daily as needed. 12/15/12   Alexzandrew Perkins, PA-C  docusate sodium 100 MG CAPS Take 100 mg by mouth 2 (two) times daily. 12/15/12   Alexzandrew Perkins, PA-C  HYDROmorphone (DILAUDID) 2 MG tablet Take 1-2 tablets (2-4 mg total) by mouth every 4 (four) hours as needed. 12/15/12   Alexzandrew Perkins, PA-C  methocarbamol (ROBAXIN) 500 MG tablet Take 1 tablet (500 mg total) by mouth every 6 (six) hours as needed. 12/15/12   Alexzandrew Julien Girt, PA-C  metoCLOPramide (REGLAN) 5 MG tablet Take 1-2 tablets (5-10 mg total) by mouth every 8 (eight) hours as needed (if ondansetron (ZOFRAN) ineffective.). 12/15/12   Alexzandrew Perkins, PA-C  ondansetron (ZOFRAN) 4 MG tablet Take 1 tablet (4 mg total) by mouth every 6 (six) hours as needed for nausea. 12/15/12   Alexzandrew Perkins, PA-C  polyethylene glycol (MIRALAX / GLYCOLAX) packet Take 17 g by mouth daily as needed. 12/15/12   Alexzandrew Julien Girt, PA-C  rivaroxaban (XARELTO) 10 MG TABS tablet Take 1 tablet (10 mg total) by mouth daily with breakfast. Take Xarelto for two and a half more weeks, then discontinue Xarelto. Once the patient has completed the Xarelto, they may  resume the 81 mg Aspirin. 12/15/12   Alexzandrew Julien Girt, PA-C   Diet - Regular diet  Follow up - in 2 weeks  Activity - WBAT  Disposition - Skilled nursing facility  Condition Upon Discharge - Good  D/C Meds - See DC Summary  DVT Prophylaxis - Xarelto   Discharge Orders   Future Orders Complete By Expires   Call MD / Call 911  As directed    Comments:     If you experience chest pain or shortness of breath, CALL 911 and be transported to the hospital emergency room.  If you develope a fever above 101 F, pus (white drainage) or increased drainage or redness at the wound, or calf pain, call your surgeon's office.   Change dressing  As directed    Comments:     Change dressing daily with sterile 4 x 4 inch gauze dressing and apply TED hose. Do not submerge the incision under water.   Constipation Prevention  As directed    Comments:     Drink plenty of fluids.  Prune juice may be helpful.  You may use a stool softener, such as Colace (over the counter) 100 mg twice a day.  Use MiraLax (over the counter) for constipation as needed.   Diet - low sodium heart healthy  As directed    Discharge instructions  As directed    Comments:     Pick up stool softner and laxative for home. Do not submerge incision under water. May shower. Continue to use ice for pain and swelling from surgery. Take Xarelto for two and a half more weeks, then discontinue Xarelto. Once the patient has completed the Xarelto, they may resume the 81 mg Aspirin.  When discharged from the skilled rehab facility, please have the facility set up the patient's Home Health  Physical Therapy prior to being released.  Also provide the patient with their medications at time of release from the facility to include their pain medication, the muscle relaxants, and their blood thinner medication.  If the patient is still at the rehab facility at time of follow up appointment, please also assist the patient in arranging follow up  appointment in our office and any transportation needs.   Do not put a pillow under the knee. Place it under the heel.  As directed    Do not sit on low chairs, stoools or toilet seats, as it may be difficult to get up from low surfaces  As directed    Driving restrictions  As directed    Comments:     No driving until released by the physician.   Increase activity slowly as tolerated  As directed    Lifting restrictions  As directed    Comments:     No lifting until released by the physician.   Patient may shower  As directed    Comments:     You may shower without a dressing once there is no drainage.  Do not wash over the wound.  If drainage remains, do not shower until drainage stops.   TED hose  As directed    Comments:     Use stockings (TED hose) for 3 weeks on both leg(s).  You may remove them at night for sleeping.   Weight bearing as tolerated  As directed        Medication List    STOP taking these medications       aspirin 81 MG tablet     calcium-vitamin D 500-200 MG-UNIT per tablet  Commonly known as:  OSCAL WITH D     ibuprofen 200 MG tablet  Commonly known as:  ADVIL,MOTRIN     multivitamin with minerals Tabs tablet     OSTEO BI-FLEX REGULAR STRENGTH PO      TAKE these medications       bisacodyl 10 MG suppository  Commonly known as:  DULCOLAX  Place 1 suppository (10 mg total) rectally daily as needed.     DSS 100 MG Caps  Take 100 mg by mouth 2 (two) times daily.     HYDROmorphone 2 MG tablet  Commonly known as:  DILAUDID  Take 1-2 tablets (2-4 mg total) by mouth every 4 (four) hours as needed.     methocarbamol 500 MG tablet  Commonly known as:  ROBAXIN  Take 1 tablet (500 mg total) by mouth every 6 (six) hours as needed.     metoCLOPramide 5 MG tablet  Commonly known as:  REGLAN  Take 1-2 tablets (5-10 mg total) by mouth every 8 (eight) hours as needed (if ondansetron (ZOFRAN) ineffective.).     nebivolol 5 MG tablet  Commonly known as:   BYSTOLIC  Take 5 mg by mouth every other day. At dinner time. Alternating with paxil     ondansetron 4 MG tablet  Commonly known as:  ZOFRAN  Take 1 tablet (4 mg total) by mouth every 6 (six) hours as needed for nausea.     PARoxetine 10 MG tablet  Commonly known as:  PAXIL  Take 10 mg by mouth every other day. Alternating with bystolic     polyethylene glycol packet  Commonly known as:  MIRALAX / GLYCOLAX  Take 17 g by mouth daily as needed.     prednisoLONE acetate 1 % ophthalmic suspension  Commonly known  as:  PRED FORTE  Place 1 drop into both eyes 2 (two) times daily.     rivaroxaban 10 MG Tabs tablet  Commonly known as:  XARELTO  - Take 1 tablet (10 mg total) by mouth daily with breakfast. Take Xarelto for two and a half more weeks, then discontinue Xarelto.  - Once the patient has completed the Xarelto, they may resume the 81 mg Aspirin.           Follow-up Information   Follow up with Loanne Drilling, MD. Schedule an appointment as soon as possible for a visit in 2 weeks.   Specialty:  Orthopedic Surgery   Contact information:   [address] [place] [address] [phone]       Signed: Patrica Duel 12/15/2012, 9:47 AM

## 2012-12-15 NOTE — Progress Notes (Signed)
Utilization review completed.  

## 2012-12-23 ENCOUNTER — Inpatient Hospital Stay (HOSPITAL_COMMUNITY)
Admission: EM | Admit: 2012-12-23 | Discharge: 2012-12-29 | DRG: 871 | Disposition: A | Discharge status: Home / Self Care | Payer: Medicare Other | Attending: Internal Medicine | Admitting: Internal Medicine

## 2012-12-23 ENCOUNTER — Encounter (HOSPITAL_COMMUNITY): Payer: Self-pay | Admitting: *Deleted

## 2012-12-23 ENCOUNTER — Emergency Department (HOSPITAL_COMMUNITY): Payer: Medicare Other

## 2012-12-23 DIAGNOSIS — K5900 Constipation, unspecified: Secondary | ICD-10-CM | POA: Diagnosis present

## 2012-12-23 DIAGNOSIS — R8281 Pyuria: Secondary | ICD-10-CM | POA: Diagnosis present

## 2012-12-23 DIAGNOSIS — D649 Anemia, unspecified: Secondary | ICD-10-CM

## 2012-12-23 DIAGNOSIS — N390 Urinary tract infection, site not specified: Secondary | ICD-10-CM | POA: Diagnosis present

## 2012-12-23 DIAGNOSIS — H5440 Blindness, one eye, unspecified eye: Secondary | ICD-10-CM | POA: Diagnosis present

## 2012-12-23 DIAGNOSIS — L03115 Cellulitis of right lower limb: Secondary | ICD-10-CM | POA: Diagnosis present

## 2012-12-23 DIAGNOSIS — R609 Edema, unspecified: Secondary | ICD-10-CM | POA: Diagnosis present

## 2012-12-23 DIAGNOSIS — I10 Essential (primary) hypertension: Secondary | ICD-10-CM | POA: Diagnosis present

## 2012-12-23 DIAGNOSIS — R0902 Hypoxemia: Secondary | ICD-10-CM | POA: Diagnosis present

## 2012-12-23 DIAGNOSIS — R82998 Other abnormal findings in urine: Secondary | ICD-10-CM | POA: Diagnosis present

## 2012-12-23 DIAGNOSIS — I471 Supraventricular tachycardia: Secondary | ICD-10-CM

## 2012-12-23 DIAGNOSIS — R61 Generalized hyperhidrosis: Secondary | ICD-10-CM

## 2012-12-23 DIAGNOSIS — J189 Pneumonia, unspecified organism: Secondary | ICD-10-CM | POA: Diagnosis present

## 2012-12-23 DIAGNOSIS — M1710 Unilateral primary osteoarthritis, unspecified knee: Secondary | ICD-10-CM | POA: Diagnosis present

## 2012-12-23 DIAGNOSIS — D509 Iron deficiency anemia, unspecified: Secondary | ICD-10-CM | POA: Diagnosis present

## 2012-12-23 DIAGNOSIS — L02419 Cutaneous abscess of limb, unspecified: Secondary | ICD-10-CM | POA: Diagnosis present

## 2012-12-23 DIAGNOSIS — A419 Sepsis, unspecified organism: Secondary | ICD-10-CM

## 2012-12-23 DIAGNOSIS — Z79899 Other long term (current) drug therapy: Secondary | ICD-10-CM

## 2012-12-23 DIAGNOSIS — E876 Hypokalemia: Secondary | ICD-10-CM | POA: Diagnosis not present

## 2012-12-23 DIAGNOSIS — M79609 Pain in unspecified limb: Secondary | ICD-10-CM

## 2012-12-23 DIAGNOSIS — I5033 Acute on chronic diastolic (congestive) heart failure: Secondary | ICD-10-CM | POA: Diagnosis present

## 2012-12-23 DIAGNOSIS — I509 Heart failure, unspecified: Secondary | ICD-10-CM | POA: Diagnosis present

## 2012-12-23 DIAGNOSIS — Z96659 Presence of unspecified artificial knee joint: Secondary | ICD-10-CM

## 2012-12-23 LAB — CBC WITH DIFFERENTIAL/PLATELET
Basophils Relative: 0 % (ref 0–1)
Eosinophils Absolute: 0.1 10*3/uL (ref 0.0–0.7)
Eosinophils Relative: 1 % (ref 0–5)
HCT: 32.9 % — ABNORMAL LOW (ref 36.0–46.0)
Hemoglobin: 10.6 g/dL — ABNORMAL LOW (ref 12.0–15.0)
Lymphs Abs: 1.1 10*3/uL (ref 0.7–4.0)
MCH: 30.3 pg (ref 26.0–34.0)
MCV: 94 fL (ref 78.0–100.0)
Monocytes Relative: 9 % (ref 3–12)
RBC: 3.5 MIL/uL — ABNORMAL LOW (ref 3.87–5.11)

## 2012-12-23 LAB — URINE MICROSCOPIC-ADD ON

## 2012-12-23 LAB — POCT I-STAT TROPONIN I: Troponin i, poc: 0.04 ng/mL (ref 0.00–0.08)

## 2012-12-23 LAB — HEPATIC FUNCTION PANEL
AST: 33 U/L (ref 0–37)
Albumin: 2.7 g/dL — ABNORMAL LOW (ref 3.5–5.2)
Bilirubin, Direct: 0.2 mg/dL (ref 0.0–0.3)
Indirect Bilirubin: 0.4 mg/dL (ref 0.3–0.9)
Total Bilirubin: 0.6 mg/dL (ref 0.3–1.2)

## 2012-12-23 LAB — URINALYSIS, ROUTINE W REFLEX MICROSCOPIC
Bilirubin Urine: NEGATIVE
Hgb urine dipstick: NEGATIVE
Ketones, ur: NEGATIVE mg/dL
Nitrite: NEGATIVE
Protein, ur: NEGATIVE mg/dL
Specific Gravity, Urine: 1.017 (ref 1.005–1.030)
pH: 6 (ref 5.0–8.0)

## 2012-12-23 LAB — CG4 I-STAT (LACTIC ACID): Lactic Acid, Venous: 1.05 mmol/L (ref 0.5–2.2)

## 2012-12-23 MED ORDER — ONDANSETRON HCL 4 MG/2ML IJ SOLN
4.0000 mg | Freq: Once | INTRAMUSCULAR | Status: AC
  Administered 2012-12-23: 4 mg via INTRAVENOUS
  Filled 2012-12-23: qty 2

## 2012-12-23 MED ORDER — HYDROMORPHONE HCL PF 1 MG/ML IJ SOLN
0.5000 mg | INTRAMUSCULAR | Status: DC | PRN
  Administered 2012-12-24 (×4): 1 mg via INTRAVENOUS
  Filled 2012-12-23 (×4): qty 1

## 2012-12-23 MED ORDER — ONDANSETRON HCL 4 MG PO TABS: 4.0000 mg | ORAL_TABLET | Freq: Four times a day (QID) | ORAL | Status: DC | PRN

## 2012-12-23 MED ORDER — BISACODYL 10 MG RE SUPP
10.0000 mg | Freq: Every day | RECTAL | Status: DC | PRN
Start: 2012-12-23 — End: 2012-12-29

## 2012-12-23 MED ORDER — DOCUSATE SODIUM 100 MG PO CAPS
100.0000 mg | ORAL_CAPSULE | Freq: Two times a day (BID) | ORAL | Status: DC
  Administered 2012-12-24 – 2012-12-27 (×7): 100 mg via ORAL
  Filled 2012-12-23 (×12): qty 1

## 2012-12-23 MED ORDER — DIPHENHYDRAMINE HCL 50 MG/ML IJ SOLN
25.0000 mg | Freq: Once | INTRAMUSCULAR | Status: AC
  Administered 2012-12-23: 25 mg via INTRAVENOUS
  Filled 2012-12-23: qty 1

## 2012-12-23 MED ORDER — ACETAMINOPHEN 325 MG PO TABS
650.0000 mg | ORAL_TABLET | Freq: Once | ORAL | Status: AC
  Administered 2012-12-23: 650 mg via ORAL
  Filled 2012-12-23: qty 2

## 2012-12-23 MED ORDER — SODIUM CHLORIDE 0.9 % IV SOLN
1000.0000 mL | Freq: Once | INTRAVENOUS | Status: AC
  Administered 2012-12-23: 1000 mL via INTRAVENOUS

## 2012-12-23 MED ORDER — ONDANSETRON HCL 4 MG/2ML IJ SOLN: 4.0000 mg | Freq: Four times a day (QID) | INTRAMUSCULAR | Status: DC | PRN

## 2012-12-23 MED ORDER — SODIUM CHLORIDE 0.9 % IV SOLN: INTRAVENOUS | Status: DC

## 2012-12-23 MED ORDER — ACETAMINOPHEN 325 MG PO TABS
650.0000 mg | ORAL_TABLET | Freq: Four times a day (QID) | ORAL | Status: DC | PRN
  Administered 2012-12-24 – 2012-12-25 (×3): 650 mg via ORAL
  Filled 2012-12-23 (×3): qty 2

## 2012-12-23 MED ORDER — ALUM & MAG HYDROXIDE-SIMETH 200-200-20 MG/5ML PO SUSP: 30.0000 mL | Freq: Four times a day (QID) | ORAL | Status: DC | PRN

## 2012-12-23 MED ORDER — NEBIVOLOL HCL 5 MG PO TABS
5.0000 mg | ORAL_TABLET | ORAL | Status: DC
  Filled 2012-12-23: qty 1

## 2012-12-23 MED ORDER — POLYETHYLENE GLYCOL 3350 17 G PO PACK
17.0000 g | PACK | Freq: Every day | ORAL | Status: DC | PRN
  Filled 2012-12-23: qty 1

## 2012-12-23 MED ORDER — DEXTROSE 5 % IV SOLN: 500.0000 mg | Freq: Once | INTRAVENOUS | Status: DC

## 2012-12-23 MED ORDER — HYDROMORPHONE HCL PF 1 MG/ML IJ SOLN
1.0000 mg | Freq: Once | INTRAMUSCULAR | Status: AC
  Administered 2012-12-23: 1 mg via INTRAVENOUS
  Filled 2012-12-23: qty 1

## 2012-12-23 MED ORDER — DEXTROSE 5 % IV SOLN
1.0000 g | INTRAVENOUS | Status: DC
  Administered 2012-12-24 – 2012-12-28 (×5): 1 g via INTRAVENOUS
  Filled 2012-12-23 (×6): qty 10

## 2012-12-23 MED ORDER — PREDNISOLONE ACETATE 1 % OP SUSP
1.0000 [drp] | Freq: Two times a day (BID) | OPHTHALMIC | Status: DC
  Administered 2012-12-24 – 2012-12-29 (×12): 1 [drp] via OPHTHALMIC
  Filled 2012-12-23: qty 1

## 2012-12-23 MED ORDER — SODIUM CHLORIDE 0.9 % IV SOLN
1000.0000 mL | INTRAVENOUS | Status: DC
  Administered 2012-12-23: 1000 mL via INTRAVENOUS

## 2012-12-23 MED ORDER — DEXTROSE 5 % IV SOLN
1.0000 g | Freq: Once | INTRAVENOUS | Status: AC
  Administered 2012-12-23: 1 g via INTRAVENOUS
  Filled 2012-12-23: qty 10

## 2012-12-23 MED ORDER — ACETAMINOPHEN 650 MG RE SUPP: 650.0000 mg | Freq: Four times a day (QID) | RECTAL | Status: DC | PRN

## 2012-12-23 MED ORDER — RIVAROXABAN 10 MG PO TABS
10.0000 mg | ORAL_TABLET | Freq: Every day | ORAL | Status: DC
  Administered 2012-12-24 – 2012-12-29 (×6): 10 mg via ORAL
  Filled 2012-12-23 (×7): qty 1

## 2012-12-23 MED ORDER — PAROXETINE HCL 10 MG PO TABS: 10.0000 mg | ORAL_TABLET | ORAL | Status: DC

## 2012-12-23 MED ORDER — ZOLPIDEM TARTRATE 5 MG PO TABS: 5.0000 mg | ORAL_TABLET | Freq: Every evening | ORAL | Status: DC | PRN

## 2012-12-23 MED ORDER — DEXTROSE 5 % IV SOLN: 500.0000 mg | INTRAVENOUS | Status: DC

## 2012-12-23 MED ORDER — VANCOMYCIN HCL IN DEXTROSE 1-5 GM/200ML-% IV SOLN
1000.0000 mg | Freq: Once | INTRAVENOUS | Status: AC
  Administered 2012-12-23: 1000 mg via INTRAVENOUS
  Filled 2012-12-23: qty 200

## 2012-12-23 MED ORDER — METHOCARBAMOL 500 MG PO TABS
500.0000 mg | ORAL_TABLET | Freq: Four times a day (QID) | ORAL | Status: DC | PRN
  Administered 2012-12-25 – 2012-12-28 (×5): 500 mg via ORAL
  Filled 2012-12-23 (×5): qty 1

## 2012-12-23 NOTE — Progress Notes (Signed)
VASCULAR LAB PRELIMINARY  PRELIMINARY  PRELIMINARY  PRELIMINARY  Right lower extremity venous duplex completed.    Preliminary report:  Right:  No evidence of DVT or superficial thrombosis.  Incidental finding:  There is an area of mixed echogenicity in the popliteal fossa with vascularization noted at the periphery.      Kaire Stary, RVT 12/23/2012, 7:29 PM

## 2012-12-23 NOTE — ED Notes (Signed)
Norris, MD at bedside.

## 2012-12-23 NOTE — ED Notes (Signed)
Patient's oxygen dropped to 87% on room air. Placed patient on 2L of oxygen, 100% 2L

## 2012-12-23 NOTE — ED Provider Notes (Signed)
Medical screening examination/treatment/procedure(s) were conducted as a shared visit with non-physician practitioner(s) and myself.  I personally evaluated the patient during the encounter  This 79 year old female has been having persistent right knee pain since her right knee replacement on September 22 and now has 2 days of worsening pain to the right medial thigh with redness warmth tenderness swelling and fever to 102 as well as today developed some intermittent mild vague chest discomfort shortness breath lasting several minutes at a time off and on multiple times today without cough or altered mental status but did have mild hypoxia on room air 87% upon arrival now improved to 95% on nasal cannula oxygen. Right medial thigh has several centimeter diameter region of erythema induration warmth tenderness suggestive of cellulitis without fluctuance or subcutaneous emphysema, the knee incision is intact with minimal if any surrounding erythema without excess warmth without fluctuance without purulent drainage, her right knee joint itself does not have new swelling or tenderness and the right calf is nontender right foot has intact dorsalis pedis pulse normal light touch good movement capillary refill less than 2 seconds. Doubt septic post-op joint but Ortho consulted, Pt appears to have cellulitis, UTI, and HCAP with hypoxia.  ECG: Sinus rhythm, ventricular rate 78, normal axis, normal intervals, no acute ischemic changes noted, no significant change noted compared with 12/15/2012  Hurman Horn, MD 12/24/12 [phone]

## 2012-12-23 NOTE — Progress Notes (Signed)
CSW met with pt at bedside.  She confirmed that she is currently a resident at [facility].  Patient reports that it is temporary and only for rehab of her knee and that after she is done with rehab she plans to return home.  She plans to return to [facility] to complete rehab once she is medically cleared.  Pt reports she does not currently have a HCPOA.  Patients children Almyra Brace and Nou Chard were at her bedside.   Family thanked CSW for support.  Marva Panda, LCSWA  [phone] .12/23/2012 7:00 pm

## 2012-12-23 NOTE — ED Notes (Signed)
Patient has erythema and edema to her right knee. Erythema and edema extends towards right thigh and into right calf. Surgical incision is intact with steri-strips in place. Old drainage noted. Knee is warm to touch with taut skin noted to left lateral knee into thigh area.

## 2012-12-23 NOTE — ED Notes (Signed)
Bed: ZO10 Expected date:  Expected time:  Means of arrival:  Comments: EMS-knee infection

## 2012-12-23 NOTE — ED Provider Notes (Signed)
CSN: [id number]     Arrival date & time 12/23/12  1716 History   First MD Initiated Contact with Patient 12/23/12 1719     No chief complaint on file.  (Consider location/radiation/quality/duration/timing/severity/associated sxs/prior Treatment) HPI Comments: Patient is an 79 year old female who presents today with fever and leg pain. She had a recent total knee replacement on 9/22 by Dr. Lequita Halt. She saw Dr. Lequita Halt in the office on Tuesday. At that time she had a fever, but little swelling around her right knee. Since that time she has had increased swelling, tenderness, warmth, erythema to the area. There is no drainage coming from her incision site. She reports "it is a pain I cannot describe". She is able to bend her right knee. She went to physical therapy today. After having increased difficulty of physical therapy she called Dr. Despina Hick who recommended her to come to the emergency department. She denies any other medical history. She is alert and oriented x3.  Later she reveals she has been having intermittent chest pain all day today. The chest pain is a heaviness. It lasts approximately a few minutes when the pain comes on.  The history is provided by the patient. No language interpreter was used.    Past Medical History  Diagnosis Date  . Arthritis   . Blind left eye   . Dysrhythmia   . Varicose veins     both legs and left fooot at arch  . Complication of anesthesia     sensitive to meds, bp can fall  . SVT (supraventricular tachycardia) 12/15/2011   Past Surgical History  Procedure Laterality Date  . Cholecystectomy    . Tonsillectomy    . Tonsillectomy    . Corneal transplant  right 2009, left 2009    both eyes, 3 in left eye, 1 in right eye  . Cataract surgery  2005    both eyes  . Abdominal hysterectomy   sept 1985    ovaries removed  . Total knee arthroplasty Left 06/27/2012    Procedure: LEFT TOTAL KNEE ARTHROPLASTY;  Surgeon: Loanne Drilling, MD;  Location: [facility];   Service: Orthopedics;  Laterality: Left;  . Tubal ligation    . Total knee arthroplasty Right 12/12/2012    Procedure: RIGHT TOTAL KNEE ARTHROPLASTY;  Surgeon: Loanne Drilling, MD;  Location: [facility];  Service: Orthopedics;  Laterality: Right;   Family History  Problem Relation Age of Onset  . Stroke Mother   . Heart attack Father   . Heart attack Brother   . Cancer Brother    History  Substance Use Topics  . Smoking status: Never Smoker   . Smokeless tobacco: Never Used  . Alcohol Use: No   OB History   Grav Para Term Preterm Abortions TAB SAB Ect Mult Living                 Review of Systems  Constitutional: Positive for fever.  Respiratory: Negative for shortness of breath.   Cardiovascular: Positive for chest pain.  Skin: Positive for rash.  All other systems reviewed and are negative.    Allergies  Ultram; Buprenex; Levofloxacin; Percocet; Sulfa antibiotics; Tape; and Zetia  Home Medications   Current Outpatient Rx  Name  Route  Sig  Dispense  Refill  . bisacodyl (DULCOLAX) 10 MG suppository   Rectal   Place 1 suppository (10 mg total) rectally daily as needed.   12 suppository   0   . docusate sodium 100 MG  CAPS   Oral   Take 100 mg by mouth 2 (two) times daily.   10 capsule   0   . HYDROmorphone (DILAUDID) 2 MG tablet   Oral   Take 1-2 tablets (2-4 mg total) by mouth every 4 (four) hours as needed.   80 tablet   0   . methocarbamol (ROBAXIN) 500 MG tablet   Oral   Take 1 tablet (500 mg total) by mouth every 6 (six) hours as needed.   80 tablet   0   . metoCLOPramide (REGLAN) 5 MG tablet   Oral   Take 1-2 tablets (5-10 mg total) by mouth every 8 (eight) hours as needed (if ondansetron (ZOFRAN) ineffective.).   40 tablet   0   . nebivolol (BYSTOLIC) 5 MG tablet   Oral   Take 5 mg by mouth every other day. At dinner time. Alternating with paxil         . ondansetron (ZOFRAN) 4 MG tablet   Oral   Take 1 tablet (4 mg total) by mouth every 6  (six) hours as needed for nausea.   40 tablet   0   . PARoxetine (PAXIL) 10 MG tablet   Oral   Take 10 mg by mouth every other day. Alternating with bystolic         . polyethylene glycol (MIRALAX / GLYCOLAX) packet   Oral   Take 17 g by mouth daily as needed.   14 each   0   . prednisoLONE acetate (PRED FORTE) 1 % ophthalmic suspension   Both Eyes   Place 1 drop into both eyes 2 (two) times daily.          . rivaroxaban (XARELTO) 10 MG TABS tablet   Oral   Take 1 tablet (10 mg total) by mouth daily with breakfast. Take Xarelto for two and a half more weeks, then discontinue Xarelto. Once the patient has completed the Xarelto, they may resume the 81 mg Aspirin.   18 tablet   0    BP 159/61  Pulse 95  Temp(Src) 101.4 F (38.6 C) (Oral)  Resp 18  SpO2 91% Physical Exam  Nursing note and vitals reviewed. Constitutional: She is oriented to person, place, and time. She appears well-developed and well-nourished. No distress.  HENT:  Head: Normocephalic and atraumatic.  Right Ear: External ear normal.  Left Ear: External ear normal.  Nose: Nose normal.  Mouth/Throat: Oropharynx is clear and moist.  Eyes: Conjunctivae are normal.  Neck: Normal range of motion.  Cardiovascular: Normal rate, regular rhythm and normal heart sounds.   Pulmonary/Chest: Effort normal. No stridor. No respiratory distress. She has decreased breath sounds in the right lower field and the left lower field. She has no wheezes.  Abdominal: Soft. She exhibits no distension.  Musculoskeletal: Normal range of motion.  AROM of right knee mildly limited  Neurological: She is alert and oriented to person, place, and time. She has normal strength. Coordination normal.  Skin: Skin is warm and dry. She is not diaphoretic. There is erythema.  Warmth and erythema to medial thigh. Blanches with palpation. Incision site does appear to be healing well.   Psychiatric: She has a normal mood and affect. Her  behavior is normal.    ED Course  Procedures (including critical care time) Labs Review Labs Reviewed  CBC WITH DIFFERENTIAL - Abnormal; Notable for the following:    RBC 3.50 (*)    Hemoglobin 10.6 (*)    HCT  32.9 (*)    All other components within normal limits  URINALYSIS, ROUTINE W REFLEX MICROSCOPIC - Abnormal; Notable for the following:    Leukocytes, UA MODERATE (*)    All other components within normal limits  HEPATIC FUNCTION PANEL - Abnormal; Notable for the following:    Total Protein 5.5 (*)    Albumin 2.7 (*)    Alkaline Phosphatase 560 (*)    All other components within normal limits  CULTURE, BLOOD (ROUTINE X 2)  CULTURE, BLOOD (ROUTINE X 2)  URINE CULTURE  URINE MICROSCOPIC-ADD ON  BASIC METABOLIC PANEL  CBC  CG4 I-STAT (LACTIC ACID)  POCT I-STAT TROPONIN I  POCT I-STAT TROPONIN I   Imaging Review Dg Chest 2 View  12/23/2012   CLINICAL DATA:  Fever and shortness of breath after knee replacement  EXAM: CHEST  2 VIEW  COMPARISON:  12/15/2012  FINDINGS: Cardiomegaly noted with a few interstitial Kerley B-lines peripherally and central vascular congestion. Lung volumes are suboptimal with crowding of the bronchovascular markings. Trace pleural fluid is present. No pneumothorax. No acute osseous abnormality.  IMPRESSION: Cardiomegaly with probable early interstitial edema and trace pleural effusions. Followup after diuresis may be helpful if there is continued clinical concern for pneumonia which could be masked by edema.   Electronically Signed   By: Christiana Pellant M.D.   On: 12/23/2012 20:28   Dg Knee Complete 4 Views Right  12/23/2012   CLINICAL DATA:  Right knee pain. Status post right knee arthroplasty on 12/12/2012.  EXAM: RIGHT KNEE - COMPLETE 4+ VIEW  COMPARISON:  None.  FINDINGS: Alignment of a total right knee arthroplasty appears normal. No fracture, dislocation or abnormal lucency is identified. The lateral projection does show some soft tissue swelling  anterior to the patella. No obvious joint effusion is seen.  IMPRESSION: Normal appearance of right knee arthroplasty. Some soft tissue swelling is noted in the prepatellar region.   Electronically Signed   By: Irish Lack M.D.   On: 12/23/2012 18:42    Date: 12/24/2012  Rate: 78  Rhythm: normal sinus rhythm  QRS Axis: normal  Intervals: normal  ST/T Wave abnormalities: normal  Conduction Disutrbances:none  Narrative Interpretation:   Old EKG Reviewed: unchanged    MDM   1. Sepsis   2. Cellulitis of leg, right   3. Hypoxemia   4. OA (osteoarthritis) of knee   5. Pyuria    Patient presents today for worsening cellulitis of her right leg after a TKA done on 9/22 by Dr. Lequita Halt. Patient also found to be hypoxic on arrival. Chest x-ray shows possible pneumonia and pulmonary edema. Patient next Sirs criteria and was therefore given 3 L of fluid in the emergency department. Lasix was held at this time. Patient was also found to have a urinary tract infection. Blood cultures were drawn. IV vancomycin, Rocephin, azithromycin were all started in the emergency department. Dr. Ranell Patrick saw this patient in the emergency department. No concern for septic joint at this time. After Dr. Ranell Patrick' evaluation, I discussed this case with Dr. Lovell Sheehan. She agrees to admit patient. Admission is appreciated. Vital signs stable for transfer to floor. Dr. Fonnie Jarvis evaluated patient and agrees with plan.    9:42 PM Discussed case with Dr. Ranell Patrick who is on call for [facility] orthopedics. He is currently in surgery, but would like for me to wait to call the hospitalist until he can come in to see the patient in the emergency department. No additional orders at this time.  Mora Bellman, PA-C 12/24/12 [phone]

## 2012-12-23 NOTE — ED Notes (Signed)
Per EMS pt coming from [facility] with c/o knee pain. Per EMS pt had knee replacement surgery on 9/22; pt now has developed fever (102) redness around the incision site. Per EMS pt was given PO dilaudid about 40 min ago.

## 2012-12-24 ENCOUNTER — Encounter (HOSPITAL_COMMUNITY): Payer: Self-pay | Admitting: Internal Medicine

## 2012-12-24 DIAGNOSIS — L03115 Cellulitis of right lower limb: Secondary | ICD-10-CM | POA: Diagnosis present

## 2012-12-24 DIAGNOSIS — R8281 Pyuria: Secondary | ICD-10-CM | POA: Diagnosis present

## 2012-12-24 DIAGNOSIS — A419 Sepsis, unspecified organism: Principal | ICD-10-CM

## 2012-12-24 DIAGNOSIS — R0902 Hypoxemia: Secondary | ICD-10-CM | POA: Diagnosis present

## 2012-12-24 DIAGNOSIS — M1710 Unilateral primary osteoarthritis, unspecified knee: Secondary | ICD-10-CM

## 2012-12-24 DIAGNOSIS — I5033 Acute on chronic diastolic (congestive) heart failure: Secondary | ICD-10-CM

## 2012-12-24 DIAGNOSIS — R82998 Other abnormal findings in urine: Secondary | ICD-10-CM

## 2012-12-24 DIAGNOSIS — L02419 Cutaneous abscess of limb, unspecified: Secondary | ICD-10-CM

## 2012-12-24 LAB — FERRITIN: Ferritin: 143 ng/mL (ref 10–291)

## 2012-12-24 LAB — BASIC METABOLIC PANEL
BUN: 13 mg/dL (ref 6–23)
Chloride: 104 mEq/L (ref 96–112)
Creatinine, Ser: 0.57 mg/dL (ref 0.50–1.10)
GFR calc Af Amer: >90 mL/min (ref >90)
GFR calc non Af Amer: 82 mL/min — ABNORMAL LOW (ref >90)
Glucose, Bld: 113 mg/dL — ABNORMAL HIGH (ref 70–99)
Potassium: 4.2 mEq/L (ref 3.5–5.1)

## 2012-12-24 LAB — CBC
MCHC: 31.7 g/dL (ref 30.0–36.0)
RBC: 3.07 MIL/uL — ABNORMAL LOW (ref 3.87–5.11)
RDW: 14.9 % (ref 11.5–15.5)
WBC: 8.1 10*3/uL (ref 4.0–10.5)

## 2012-12-24 LAB — IRON AND TIBC
Iron: 26 ug/dL — ABNORMAL LOW (ref 42–135)
TIBC: 258 ug/dL (ref 250–470)

## 2012-12-24 LAB — MRSA PCR SCREENING: MRSA by PCR: NEGATIVE

## 2012-12-24 MED ORDER — NEBIVOLOL HCL 5 MG PO TABS
5.0000 mg | ORAL_TABLET | ORAL | Status: DC
  Administered 2012-12-25: 5 mg via ORAL
  Filled 2012-12-24: qty 1

## 2012-12-24 MED ORDER — SODIUM CHLORIDE 0.9 % IV SOLN
125.0000 mg | Freq: Once | INTRAVENOUS | Status: AC
  Administered 2012-12-24: 19:00:00 125 mg via INTRAVENOUS
  Filled 2012-12-24: qty 10

## 2012-12-24 MED ORDER — FUROSEMIDE 10 MG/ML IJ SOLN
40.0000 mg | Freq: Every day | INTRAMUSCULAR | Status: DC
  Administered 2012-12-24: 13:00:00 40 mg via INTRAVENOUS
  Filled 2012-12-24 (×2): qty 4

## 2012-12-24 MED ORDER — VANCOMYCIN HCL IN DEXTROSE 750-5 MG/150ML-% IV SOLN
750.0000 mg | Freq: Two times a day (BID) | INTRAVENOUS | Status: DC
  Administered 2012-12-24 – 2012-12-26 (×6): 750 mg via INTRAVENOUS
  Filled 2012-12-24 (×8): qty 150

## 2012-12-24 MED ORDER — FERROUS SULFATE 325 (65 FE) MG PO TABS
325.0000 mg | ORAL_TABLET | Freq: Two times a day (BID) | ORAL | Status: DC
  Administered 2012-12-24 – 2012-12-29 (×10): 325 mg via ORAL
  Filled 2012-12-24 (×12): qty 1

## 2012-12-24 MED ORDER — PAROXETINE HCL 10 MG PO TABS
10.0000 mg | ORAL_TABLET | ORAL | Status: DC
  Administered 2012-12-25 – 2012-12-28 (×2): 10 mg via ORAL
  Filled 2012-12-24 (×2): qty 1

## 2012-12-24 MED ORDER — DIPHENHYDRAMINE HCL 25 MG PO CAPS
25.0000 mg | ORAL_CAPSULE | Freq: Three times a day (TID) | ORAL | Status: DC | PRN
  Administered 2012-12-24 (×2): 25 mg via ORAL
  Filled 2012-12-24 (×2): qty 1

## 2012-12-24 NOTE — Evaluation (Signed)
Physical Therapy Evaluation Patient Details Name: [name] [name] MRN: [id number] DOB: 1924/09/27 Today's Date: 12/24/2012 Time: 7829-5621 PT Time Calculation (min): 17 min  PT Assessment / Plan / Recommendation History of Present Illness  79 y.o. female from the [facility] for Rehab therapy after a right Total Knee Arthoplasty on 09/22 who was brought to the ED due to increased pain redness and swelling of her right knee x 2 days.  She reports feeling heat from the area and has  Had increased stiffness.   She was evaluated in the ED, and underwent an Ultrasound to evaluate for a possible DVT and the study was negative for a DVT.  She was placed on IV antibiotics for Cellulitis, and Orthopedics was consulted and Dr. Ranell Patrick saw her in the ED.   She was felt to have an pneumonia and a UTI and was also placed on further antibiotic coverage to cover as well.  She was referred for admission.      Clinical Impression  Pt admitted with above. Pt currently with functional limitations due to the deficits listed below (see PT Problem List).  Pt will benefit from skilled PT to increase their independence and safety with mobility to allow discharge to the venue listed below.  Pt with fever this morning and did not sleep well so only wished to transfer to [facility] and perform exercises today.  Pt with SaO2 79% room air after transfer to [facility] so reapplied 3L O2 [place] and educated pt and family to have pt wear O2 for all mobility at this time (as family reports pt received lasix).  Pt also performed a few exercises in bed within pain tolerance and agreeable to ambulate next visit.     PT Assessment  Patient needs continued PT services    Follow Up Recommendations  SNF    Does the patient have the potential to tolerate intense rehabilitation      Barriers to Discharge        Equipment Recommendations  None recommended by PT    Recommendations for Other Services     Frequency Min 4X/week    Precautions /  Restrictions Precautions Precautions: Knee;Fall Restrictions RLE Weight Bearing: Weight bearing as tolerated   Pertinent Vitals/Pain C/o pain in R knee not rated, ice pack applied, exercises within pain tolerance SaO2 97% on 3L at rest SaO2 79% room air upon transfer to BSC SaO2 improved to 93% with 3L O2     Mobility  Bed Mobility Bed Mobility: Supine to Sit;Sit to Supine Supine to Sit: 5: Supervision;HOB elevated Sit to Supine: 5: Supervision;HOB elevated Transfers Transfers: Sit to Stand;Stand to Sit Sit to Stand: 4: Min guard;With upper extremity assist;From bed;From chair/3-in-1 Stand to Sit: 4: Min guard;With upper extremity assist;To bed;To chair/3-in-1 Stand Pivot Transfers: 4: Min guard;With armrests Details for Transfer Assistance: pt assisted bed <> BSC with min/guard for safety, verbal cues for hand placement Ambulation/Gait Ambulation/Gait Assistance: Not tested (comment) (pt declined today, too fatigue, fever this am)    Exercises Total Joint Exercises Quad Sets: AROM;10 reps;Supine;Right Heel Slides: 10 reps;Supine;AROM;Both;Other (comment) (in pain free range) Hip ABduction/ADduction: AROM;Right;5 reps;Supine Straight Leg Raises: AROM;Right;10 reps;Supine   PT Diagnosis: Difficulty walking;Acute pain  PT Problem List: Decreased strength;Decreased range of motion;Decreased activity tolerance;Decreased mobility;Decreased knowledge of use of DME;Pain PT Treatment Interventions: DME instruction;Gait training;Functional mobility training;Therapeutic activities;Therapeutic exercise;Patient/family education;Stair training (may need stairs if decides to d/c home instead of SNF)     PT Goals(Current goals can be found in  the care plan section) Acute Rehab PT Goals PT Goal Formulation: With patient Time For Goal Achievement: 01/07/13 Potential to Achieve Goals: Good  Visit Information  Last PT Received On: 12/24/12 Assistance Needed: +1 History of Present Illness:  79 y.o. female from the [facility] for Rehab therapy after a right Total Knee Arthoplasty on 09/22 who was brought to the ED due to increased pain redness and swelling of her right knee x 2 days.  She reports feeling heat from the area and has  Had increased stiffness.   She was evaluated in the ED, and underwent an Ultrasound to evaluate for a possible DVT and the study was negative for a DVT.  She was placed on IV antibiotics for Cellulitis, and Orthopedics was consulted and Dr. Ranell Patrick saw her in the ED.   She was felt to have an pneumonia and a UTI and was also placed on further antibiotic coverage to cover as well.  She was referred for admission.          Prior Functioning  Home Living Family/patient expects to be discharged to:: Skilled nursing facility Additional Comments: Pt states she will likely return to SNF for continuing ST-rehab after R TKR, pt's RW is present in her hospital room Prior Function Comments: L blind eye, independent prior to R TKR last week Communication Communication: [facility]    Cognition  Cognition Arousal/Alertness: Awake/alert Behavior During Therapy: WFL for tasks assessed/performed Overall Cognitive Status: Within Functional Limits for tasks assessed    Extremity/Trunk Assessment Upper Extremity Assessment Upper Extremity Assessment: Overall WFL for tasks assessed Lower Extremity Assessment Lower Extremity Assessment: RLE deficits/detail RLE Deficits / Details: ankle WFL;  able to perform SLR, limited knee flexion due to pain/swelling approx to 80* AROM supine   Balance    End of Session PT - End of Session Equipment Utilized During Treatment: Oxygen Activity Tolerance: Patient limited by fatigue;Patient limited by pain Patient left: in bed;with call bell/phone within reach;with family/visitor present  GP     Kendell Gammon,KATHrine E 12/24/2012, 1:39 PM Zenovia Jarred, PT, DPT 12/24/2012 Pager: [phone]

## 2012-12-24 NOTE — ED Notes (Signed)
Jenkins, MD at bedside.  

## 2012-12-24 NOTE — H&P (Signed)
[facility] History and Physical  Jannifer Fischler ZOX:[id number] DOB: 08-24-1924 DOA: 12/23/2012  Referring physician: EDP PCP: Hollice Espy, MD  Specialists:   Chief Complaint:   HPI: [name] [name] is a 79 y.o. female from the [facility] for Rehab therapy after a right Total Knee Arthoplasty on 09/22 who was brought to the ED due to increased pain redness and swelling of her right knee x 2 days.  She reports feeling heat from the area and has  Had increased stiffness.   She was evaluated in the ED, and underwent an Ultrasound to evaluate for a possible DVT and the study was negative for a DVT.  She was placed on IV antibiotics for Cellulitis, and Orthopedics was consulted and Dr. Ranell Patrick saw her in the ED.   She was felt to have an pneumonia and a UTI and was also placed on further antibiotic coverage to cover as well.  She was referred for admission.      Review of Systems: The patient denies anorexia, fever, chills, headaches, weight loss, vision loss, diplopia, dizziness, decreased hearing, rhinitis, hoarseness, chest pain, syncope, dyspnea on exertion, peripheral edema, balance deficits, cough, hemoptysis, abdominal pain, nausea, vomiting, diarrhea, constipation, hematemesis, melena, hematochezia, severe indigestion/heartburn, dysuria, hematuria, incontinence, muscle weakness, suspicious skin lesions, depression, unusual weight change, abnormal bleeding, enlarged lymph nodes, angioedema, and breast masses.    Past Medical History  Diagnosis Date  . Arthritis   . Blind left eye     Corneal transplant x3 with rejection  . Dysrhythmia   . Varicose veins     both legs and left fooot at arch  . Complication of anesthesia     sensitive to meds, bp can fall  . SVT (supraventricular tachycardia) 12/15/2011    Past Surgical History  Procedure Laterality Date  . Cholecystectomy    . Tonsillectomy    . Tonsillectomy    . Corneal transplant  right 2009, left 2009    both eyes, 3 in  left eye, 1 in right eye  . Cataract surgery  2005    both eyes  . Abdominal hysterectomy   sept 1985    ovaries removed  . Total knee arthroplasty Left 06/27/2012    Procedure: LEFT TOTAL KNEE ARTHROPLASTY;  Surgeon: Loanne Drilling, MD;  Location: [facility];  Service: Orthopedics;  Laterality: Left;  . Tubal ligation    . Total knee arthroplasty Right 12/12/2012    Procedure: RIGHT TOTAL KNEE ARTHROPLASTY;  Surgeon: Loanne Drilling, MD;  Location: [facility];  Service: Orthopedics;  Laterality: Right;    Prior to Admission medications   Medication Sig Start Date End Date Taking? Authorizing Provider  bisacodyl (DULCOLAX) 10 MG suppository Place 1 suppository (10 mg total) rectally daily as needed. 12/15/12  Yes Alexzandrew Julien Girt, PA-C  docusate sodium 100 MG CAPS Take 100 mg by mouth 2 (two) times daily. 12/15/12  Yes Alexzandrew Julien Girt, PA-C  HYDROmorphone (DILAUDID) 2 MG tablet Take 1-2 tablets (2-4 mg total) by mouth every 4 (four) hours as needed. 12/15/12  Yes Alexzandrew Julien Girt, PA-C  methocarbamol (ROBAXIN) 500 MG tablet Take 1 tablet (500 mg total) by mouth every 6 (six) hours as needed. 12/15/12  Yes Alexzandrew Julien Girt, PA-C  metoCLOPramide (REGLAN) 5 MG tablet Take 1-2 tablets (5-10 mg total) by mouth every 8 (eight) hours as needed (if ondansetron (ZOFRAN) ineffective.). 12/15/12  Yes Alexzandrew Julien Girt, PA-C  nebivolol (BYSTOLIC) 5 MG tablet Take 5 mg by mouth every other day. At dinner time. Alternating  with paxil   Yes Historical Provider, MD  ondansetron (ZOFRAN) 4 MG tablet Take 1 tablet (4 mg total) by mouth every 6 (six) hours as needed for nausea. 12/15/12  Yes Alexzandrew Julien Girt, PA-C  PARoxetine (PAXIL) 10 MG tablet Take 10 mg by mouth every other day. Alternating with bystolic   Yes Historical Provider, MD  polyethylene glycol (MIRALAX / GLYCOLAX) packet Take 17 g by mouth daily as needed. 12/15/12  Yes Alexzandrew Julien Girt, PA-C  prednisoLONE acetate (PRED FORTE) 1 % ophthalmic  suspension Place 1 drop into both eyes 2 (two) times daily.    Yes Historical Provider, MD  rivaroxaban (XARELTO) 10 MG TABS tablet Take 1 tablet (10 mg total) by mouth daily with breakfast. Take Xarelto for two and a half more weeks, then discontinue Xarelto. Once the patient has completed the Xarelto, they may resume the 81 mg Aspirin. 12/15/12  Yes Alexzandrew Julien Girt, PA-C    Allergies  Allergen Reactions  . Ultram [Tramadol] Hives  . Buprenex [Buprenorphine Hcl] Other (See Comments)    Heart raced  . Levofloxacin Other (See Comments)    levaquin unknown reaction  . Percocet [Oxycodone-Acetaminophen] Itching    "felt drained"  . Sulfa Antibiotics Other (See Comments)    unknown  . Tape     Adhesive tape causes skin to pull off  . Zetia [Ezetimibe] Other (See Comments)    unknown    Social History:  reports that she has never smoked. She has never used smokeless tobacco. She reports that she does not drink alcohol or use illicit drugs.     Family History  Problem Relation Age of Onset  . Stroke Mother   . Heart attack Father   . Heart attack Brother   . Cancer Brother     (be sure to complete)   Physical Exam:  GEN:  Pleasant elderly  79 y.o.  Caucasian female  examined  and in no acute distress; cooperative with exam Filed Vitals:   12/23/12 1723 12/23/12 2127 12/24/12 0013  BP: 159/61 108/46   Pulse: 95 67   Temp: 101.4 F (38.6 C)    TempSrc: Oral    Resp: 18 14   Height:   5' 2.5" (1.588 m)  Weight:   76.204 kg (168 lb)  SpO2: 91% 94%    Blood pressure 108/46, pulse 67, temperature 101.4 F (38.6 C), temperature source Oral, resp. rate 14, height 5' 2.5" (1.588 m), weight 76.204 kg (168 lb), SpO2 94.00%. PSYCH: SHe is alert and oriented x4; does not appear anxious does not appear depressed; affect is normal HEENT: Normocephalic and Atraumatic, Mucous membranes pink; right Eye:  PERRLA; EOM intact; Right Eye Fundusi:  Benign;  Right Eye: No scleral  icterus,Left Eye Opaque Corneal;   Nares: Patent, Oropharynx: Clear, Fair Dentition, Neck:  FROM, no cervical lymphadenopathy nor thyromegaly or carotid bruit; no JVD; Breasts:: Not examined CHEST WALL: No tenderness CHEST: Normal respiration, clear to auscultation bilaterally HEART: Regular rate and rhythm; no murmurs rubs or gallops BACK: No kyphosis or scoliosis; no CVA tenderness ABDOMEN: Positive Bowel Sounds, soft non-tender; no masses, no organomegaly.   Rectal Exam: Not done EXTREMITIES: No cyanosis, clubbing,  2+ Edema of the right Mid leg to and Knee,   MId-line Incision with +Erythema,  No Ulcerations. Genitalia: not examined PULSES: 2+ and symmetric SKIN: Normal hydration no rash or ulceration CNS: Cranial nerves 2-12 grossly intact no focal neurologic deficit    Labs on Admission:  Basic Metabolic Panel: No  results found for this basename: NA, K, CL, CO2, GLUCOSE, BUN, CREATININE, CALCIUM, MG, PHOS,  in the last 168 hours Liver Function Tests:  Recent Labs Lab 12/23/12 1750  AST 33  ALT 35  ALKPHOS 560*  BILITOT 0.6  PROT 5.5*  ALBUMIN 2.7*   No results found for this basename: LIPASE, AMYLASE,  in the last 168 hours No results found for this basename: AMMONIA,  in the last 168 hours CBC:  Recent Labs Lab 12/23/12 1750  WBC 8.1  NEUTROABS 6.2  HGB 10.6*  HCT 32.9*  MCV 94.0  PLT 310   Cardiac Enzymes: No results found for this basename: CKTOTAL, CKMB, CKMBINDEX, TROPONINI,  in the last 168 hours  BNP (last 3 results) No results found for this basename: PROBNP,  in the last 8760 hours CBG: No results found for this basename: GLUCAP,  in the last 168 hours  Radiological Exams on Admission: Dg Chest 2 View  12/23/2012   CLINICAL DATA:  Fever and shortness of breath after knee replacement  EXAM: CHEST  2 VIEW  COMPARISON:  12/15/2012  FINDINGS: Cardiomegaly noted with a few interstitial Kerley B-lines peripherally and central vascular congestion. Lung  volumes are suboptimal with crowding of the bronchovascular markings. Trace pleural fluid is present. No pneumothorax. No acute osseous abnormality.  IMPRESSION: Cardiomegaly with probable early interstitial edema and trace pleural effusions. Followup after diuresis may be helpful if there is continued clinical concern for pneumonia which could be masked by edema.   Electronically Signed   By: Christiana Pellant M.D.   On: 12/23/2012 20:28   Dg Knee Complete 4 Views Right  12/23/2012   CLINICAL DATA:  Right knee pain. Status post right knee arthroplasty on 12/12/2012.  EXAM: RIGHT KNEE - COMPLETE 4+ VIEW  COMPARISON:  None.  FINDINGS: Alignment of a total right knee arthroplasty appears normal. No fracture, dislocation or abnormal lucency is identified. The lateral projection does show some soft tissue swelling anterior to the patella. No obvious joint effusion is seen.  IMPRESSION: Normal appearance of right knee arthroplasty. Some soft tissue swelling is noted in the prepatellar region.   Electronically Signed   By: Irish Lack M.D.   On: 12/23/2012 18:42     EKG: Independently reviewed. Normal sinus Rhythm with out acute S-T changes.      Assessment/Plan Principal Problem:   Sepsis Active Problems:   Cellulitis of leg, right   OA (osteoarthritis) of knee   Pyuria   Hypoxemia   Normocytic Anemia  1.   Sepsis- Multifactorial, Cellulitis of Right Knee, and ?PNA, and Pyuria, cultures sent, Placed On IV Vancomycin and Rocephin.     2.   Cellulitis of Right Knee-  Post Surgical seen in ED by Ortho Dr Ranell Patrick,  IV Vancomycin.    3.   Hypoxemia- O2 PRN, Monitor for Volume overload.   May need lasix.   Decrease IVFs.    4.   Pyuria-   Urine culture sent, covered by IV Rocephin.     5.   DVT prophylaxis - covered by Xarelto RX.    6.  Mild Anemia- Normocytic, send Anemia Panel.        Code Status:   FULL CODE Family Communication:    Daughter at Bedside Disposition Plan:      Inpatient  Time spent:  66 Minutes  Ron Parker [facility] Pager [phone]  If 7PM-7AM, please contact night-coverage [email or web address] Password [email or web address] 12/24/2012, 12:34 AM

## 2012-12-24 NOTE — Progress Notes (Signed)
ANTIBIOTIC CONSULT NOTE - INITIAL  Pharmacy Consult for Vancomycin Indication: rule out sepsis, r/o PNA, cellulitis  Allergies  Allergen Reactions  . Ultram [Tramadol] Hives  . Buprenex [Buprenorphine Hcl] Other (See Comments)    Heart raced  . Levofloxacin Other (See Comments)    levaquin unknown reaction  . Percocet [Oxycodone-Acetaminophen] Itching    "felt drained"  . Sulfa Antibiotics Other (See Comments)    unknown  . Tape     Adhesive tape causes skin to pull off  . Zetia [Ezetimibe] Other (See Comments)    unknown    Patient Measurements: Height: 5' 2.5" (158.8 cm) Weight: 168 lb (76.204 kg) IBW/kg (Calculated) : 51.25 Adjusted Body Weight:   Vital Signs: Temp: 99 F (37.2 C) (10/04 0131) Temp src: Oral (10/04 0131) BP: 149/58 mmHg (10/04 0131) Pulse Rate: 82 (10/04 0131) Intake/Output from previous day: 10/03 0701 - 10/04 0700 In: 360 [P.O.:360] Out: 400 [Urine:400] Intake/Output from this shift: Total I/O In: 360 [P.O.:360] Out: 400 [Urine:400]  Labs:  Recent Labs  12/23/12 1750  WBC 8.1  HGB 10.6*  PLT 310   Estimated Creatinine Clearance: 49.8 ml/min (by C-G formula based on Cr of 0.52). No results found for this basename: VANCOTROUGH, Leodis Binet, VANCORANDOM, GENTTROUGH, GENTPEAK, GENTRANDOM, TOBRATROUGH, TOBRAPEAK, TOBRARND, AMIKACINPEAK, AMIKACINTROU, AMIKACIN,  in the last 72 hours   Microbiology: Recent Results (from the past 720 hour(s))  SURGICAL PCR SCREEN     Status: None   Collection Time    12/05/12  1:55 PM      Result Value Range Status   MRSA, PCR NEGATIVE  NEGATIVE Final   Staphylococcus aureus NEGATIVE  NEGATIVE Final   Comment:            The Xpert SA Assay (FDA     approved for NASAL specimens     in patients over 47 years of age),     is one component of     a comprehensive surveillance     program.  Test performance has     been validated by [employer] for patients greater     than or equal to 10 year old.    It is not intended     to diagnose infection nor to     guide or monitor treatment.    Medical History: Past Medical History  Diagnosis Date  . Arthritis   . Blind left eye     Corneal transplant x3 with rejection  . Dysrhythmia   . Varicose veins     both legs and left fooot at arch  . Complication of anesthesia     sensitive to meds, bp can fall  . SVT (supraventricular tachycardia) 12/15/2011    Medications:  Anti-infectives   Start     Dose/Rate Route Frequency Ordered Stop   12/24/12 2200  cefTRIAXone (ROCEPHIN) 1 g in dextrose 5 % 50 mL IVPB     1 g 100 mL/hr over 30 Minutes Intravenous Every 24 hours 12/23/12 2351     12/24/12 2100  azithromycin (ZITHROMAX) 500 mg in dextrose 5 % 250 mL IVPB  Status:  Discontinued     500 mg 250 mL/hr over 60 Minutes Intravenous Every 24 hours 12/23/12 2352 12/24/12 0029   12/24/12 1000  vancomycin (VANCOCIN) IVPB 750 mg/150 ml premix     750 mg 150 mL/hr over 60 Minutes Intravenous Every 12 hours 12/24/12 0527     12/23/12 2315  azithromycin (ZITHROMAX) 500 mg in  dextrose 5 % 250 mL IVPB  Status:  Discontinued     500 mg 250 mL/hr over 60 Minutes Intravenous  Once 12/23/12 2312 12/24/12 0029   12/23/12 2115  cefTRIAXone (ROCEPHIN) 1 g in dextrose 5 % 50 mL IVPB     1 g 100 mL/hr over 30 Minutes Intravenous  Once 12/23/12 2102 12/23/12 2200   12/23/12 1945  vancomycin (VANCOCIN) IVPB 1000 mg/200 mL premix     1,000 mg 200 mL/hr over 60 Minutes Intravenous  Once 12/23/12 1930 12/23/12 2125     Assessment: Patient with r/o PNA, sepsis and cellulitis.  First dose of antibiotics already given.  Goal of Therapy:  Vancomycin trough level 15-20 mcg/ml  Plan:  Measure antibiotic drug levels at steady state Follow up culture results Vancomycin 750mg  iv q12hr  Darlina Guys, Jacquenette Shone Crowford 12/24/2012,5:27 AM

## 2012-12-24 NOTE — Consult Note (Signed)
Reason for Consult:Right knee erythema s/p TKA Referring Physician: EDP  [name] [name] is an 79 y.o. female.  HPI: 79 yo female two weeks s/p TKA by Dr Despina Hick who reports increased erythema and pain and swelling over the last several days.  She saw Dr Despina Hick this week and things began after that.  Patient denies fevers or chills.    Past Medical History  Diagnosis Date  . Arthritis   . Blind left eye     Corneal transplant x3 with rejection  . Dysrhythmia   . Varicose veins     both legs and left fooot at arch  . Complication of anesthesia     sensitive to meds, bp can fall  . SVT (supraventricular tachycardia) 12/15/2011    Past Surgical History  Procedure Laterality Date  . Cholecystectomy    . Tonsillectomy    . Tonsillectomy    . Corneal transplant  right 2009, left 2009    both eyes, 3 in left eye, 1 in right eye  . Cataract surgery  2005    both eyes  . Abdominal hysterectomy   sept 1985    ovaries removed  . Total knee arthroplasty Left 06/27/2012    Procedure: LEFT TOTAL KNEE ARTHROPLASTY;  Surgeon: Loanne Drilling, MD;  Location: [facility];  Service: Orthopedics;  Laterality: Left;  . Tubal ligation    . Total knee arthroplasty Right 12/12/2012    Procedure: RIGHT TOTAL KNEE ARTHROPLASTY;  Surgeon: Loanne Drilling, MD;  Location: [facility];  Service: Orthopedics;  Laterality: Right;    Family History  Problem Relation Age of Onset  . Stroke Mother   . Heart attack Father   . Heart attack Brother   . Cancer Brother     Social History:  reports that she has never smoked. She has never used smokeless tobacco. She reports that she does not drink alcohol or use illicit drugs.  Allergies:  Allergies  Allergen Reactions  . Ultram [Tramadol] Hives  . Buprenex [Buprenorphine Hcl] Other (See Comments)    Heart raced  . Levofloxacin Other (See Comments)    levaquin unknown reaction  . Percocet [Oxycodone-Acetaminophen] Itching    "felt drained"  . Sulfa Antibiotics Other  (See Comments)    unknown  . Tape     Adhesive tape causes skin to pull off  . Zetia [Ezetimibe] Other (See Comments)    unknown    Medications: I have reviewed the patient's current medications.  Results for orders placed during the hospital encounter of 12/23/12 (from the past 48 hour(s))  CBC WITH DIFFERENTIAL     Status: Abnormal   Collection Time    12/23/12  5:50 PM      Result Value Range   WBC 8.1  4.0 - 10.5 K/uL   RBC 3.50 (*) 3.87 - 5.11 MIL/uL   Hemoglobin 10.6 (*) 12.0 - 15.0 g/dL   HCT 16.1 (*) 09.6 - 04.5 %   MCV 94.0  78.0 - 100.0 fL   MCH 30.3  26.0 - 34.0 pg   MCHC 32.2  30.0 - 36.0 g/dL   RDW 40.9  81.1 - 91.4 %   Platelets 310  150 - 400 K/uL   Neutrophils Relative % 76  43 - 77 %   Neutro Abs 6.2  1.7 - 7.7 K/uL   Lymphocytes Relative 13  12 - 46 %   Lymphs Abs 1.1  0.7 - 4.0 K/uL   Monocytes Relative 9  3 -  12 %   Monocytes Absolute 0.7  0.1 - 1.0 K/uL   Eosinophils Relative 1  0 - 5 %   Eosinophils Absolute 0.1  0.0 - 0.7 K/uL   Basophils Relative 0  0 - 1 %   Basophils Absolute 0.0  0.0 - 0.1 K/uL  HEPATIC FUNCTION PANEL     Status: Abnormal   Collection Time    12/23/12  5:50 PM      Result Value Range   Total Protein 5.5 (*) 6.0 - 8.3 g/dL   Albumin 2.7 (*) 3.5 - 5.2 g/dL   AST 33  0 - 37 U/L   ALT 35  0 - 35 U/L   Alkaline Phosphatase 560 (*) 39 - 117 U/L   Total Bilirubin 0.6  0.3 - 1.2 mg/dL   Bilirubin, Direct 0.2  0.0 - 0.3 mg/dL   Indirect Bilirubin 0.4  0.3 - 0.9 mg/dL  CG4 I-STAT (LACTIC ACID)     Status: None   Collection Time    12/23/12  6:36 PM      Result Value Range   Lactic Acid, Venous 1.05  0.5 - 2.2 mmol/L  URINALYSIS, ROUTINE W REFLEX MICROSCOPIC     Status: Abnormal   Collection Time    12/23/12  7:30 PM      Result Value Range   Color, Urine YELLOW  YELLOW   APPearance CLEAR  CLEAR   Specific Gravity, Urine 1.017  1.005 - 1.030   pH 6.0  5.0 - 8.0   Glucose, UA NEGATIVE  NEGATIVE mg/dL   Hgb urine dipstick  NEGATIVE  NEGATIVE   Bilirubin Urine NEGATIVE  NEGATIVE   Ketones, ur NEGATIVE  NEGATIVE mg/dL   Protein, ur NEGATIVE  NEGATIVE mg/dL   Urobilinogen, UA 0.2  0.0 - 1.0 mg/dL   Nitrite NEGATIVE  NEGATIVE   Leukocytes, UA MODERATE (*) NEGATIVE  URINE MICROSCOPIC-ADD ON     Status: None   Collection Time    12/23/12  7:30 PM      Result Value Range   Squamous Epithelial / LPF RARE  RARE   WBC, UA 11-20  <3 WBC/hpf   Comment: WITH CLUMPS   Bacteria, UA RARE  RARE  POCT I-STAT TROPONIN I     Status: None   Collection Time    12/23/12  8:04 PM      Result Value Range   Troponin i, poc 0.04  0.00 - 0.08 ng/mL   Comment 3            Comment: Due to the release kinetics of cTnI,     a negative result within the first hours     of the onset of symptoms does not rule out     myocardial infarction with certainty.     If myocardial infarction is still suspected,     repeat the test at appropriate intervals.  POCT I-STAT TROPONIN I     Status: None   Collection Time    12/23/12 11:26 PM      Result Value Range   Troponin i, poc 0.00  0.00 - 0.08 ng/mL   Comment 3            Comment: Due to the release kinetics of cTnI,     a negative result within the first hours     of the onset of symptoms does not rule out     myocardial infarction with certainty.     If myocardial infarction  is still suspected,     repeat the test at appropriate intervals.    Dg Chest 2 View  12/23/2012   CLINICAL DATA:  Fever and shortness of breath after knee replacement  EXAM: CHEST  2 VIEW  COMPARISON:  12/15/2012  FINDINGS: Cardiomegaly noted with a few interstitial Kerley B-lines peripherally and central vascular congestion. Lung volumes are suboptimal with crowding of the bronchovascular markings. Trace pleural fluid is present. No pneumothorax. No acute osseous abnormality.  IMPRESSION: Cardiomegaly with probable early interstitial edema and trace pleural effusions. Followup after diuresis may be helpful if  there is continued clinical concern for pneumonia which could be masked by edema.   Electronically Signed   By: Christiana Pellant M.D.   On: 12/23/2012 20:28   Dg Knee Complete 4 Views Right  12/23/2012   CLINICAL DATA:  Right knee pain. Status post right knee arthroplasty on 12/12/2012.  EXAM: RIGHT KNEE - COMPLETE 4+ VIEW  COMPARISON:  None.  FINDINGS: Alignment of a total right knee arthroplasty appears normal. No fracture, dislocation or abnormal lucency is identified. The lateral projection does show some soft tissue swelling anterior to the patella. No obvious joint effusion is seen.  IMPRESSION: Normal appearance of right knee arthroplasty. Some soft tissue swelling is noted in the prepatellar region.   Electronically Signed   By: Irish Lack M.D.   On: 12/23/2012 18:42    ROS Blood pressure 108/46, pulse 67, temperature 101.4 F (38.6 C), temperature source Oral, resp. rate 14, height 5' 2.5" (1.588 m), weight 76.204 kg (168 lb), SpO2 94.00%. Physical Exam  Right knee incision intact with generalized erythema around the knee area.  No effusion, no drainage, 2+ edema noted around the knee. Minimal pain with neutral range motion.  No pain with calf pumps.  Assessment/Plan: Right knee cellulitis.  No evidence for deep infection.  Recommend IV antibiotics through the weekend.  Dr Despina Hick to re-eval on Monday.  IV Vancomycin per pharmacy protocol.  Ok to continue PT for the right knee.  Ice and elevate when possible.  Kiaja Shorty,STEVEN R 12/24/2012, 12:58 AM

## 2012-12-24 NOTE — Progress Notes (Addendum)
[facility] PROGRESS NOTE  [name] [name] EAV:[id number] DOB: 1924-06-26 DOA: 12/23/2012 PCP: Hollice Espy, MD  Brief history 79 y.o. female from the [facility] for Rehab therapy after a right Total Knee Arthoplasty on 09/22 who was brought to the ED due to increased pain redness and swelling of her right knee x 2 days. She reports feeling heat from the area and has Had increased stiffness. She was evaluated in the ED, and underwent an Ultrasound to evaluate for a possible DVT and the study was negative for a DVT. The patient was found to be febrile with a soft blood pressures in the ED. The patient was started on Vancomycin after blood cultures were obtained. She was also found to have pyuria and ceftriaxone was empirically started pending urine cultures. Orthopedic surgery saw the patient, and they did not feel that the patient had any significant right knee joint effusion to suggest septic arthritis. She was continued on rivaroxaban for DVT prophylaxis. The patient also related a history of gradual worsening dyspnea on exertion at [facility] home. In addition, the patient has had increased bilateral lower extremity edema and increased abdominal girth. Chest x-ray showed increased interstitial changes. The patient was started on intravenous furosemide for acute on chronic diastolic CHF. Echocardiogram was obtained. Anemia panel was obtained and showed iron deficiency anemia with iron saturation of 10%. The patient was given ferrous sulfate and also a dose of nulecit.  Assessment/Plan: Sepsis -Secondary to cellulitis of the right lower extremity Cellulitis right lower extremity -Continue vancomycin -Patient seen by Dr. Ranell Patrick -May need arthrocentesis if no improvement and has persistent fever -Followup blood cultures Acute on chronic diastolic CHF -Start IV furosemide -Dyspnea worsened after IV fluids -Daily weights -Accurate I.'s and O.'s Pyuria -Continue antibiotics pending culture  data Anemia -Followup B12 and folate -iron saturation 10% -Give dose of Nulecit IV and start ferrous sulfate   Family Communication:   Daughter at beside; total time 60 min Disposition Plan:   SNF when medically stable  Antibiotics:  Vancomycin 12/24/2012>>>  Ceftriaxone 12/24/2012>>>    Procedures/Studies: Dg Chest 1 View  12/15/2012   CLINICAL DATA:  History of sharp chest pains. Shortness of breath. Cough. Congestion. Postoperative.  EXAM: CHEST - 1 VIEW  COMPARISON:  12/05/2012.  FINDINGS: This cardiac silhouette is upper normal size. Ectasia and nonaneurysmal calcification of the thoracic aorta are seen. Mediastinal and hilar contours appear stable. No pulmonary infiltrates or masses are evident. No pleural abnormality is evident. No pneumothorax is evident. Osteophytes are present in the spine. There are cholecystectomy clips. .  IMPRESSION: No acute or active cardiopulmonary abnormalities are identified. Stable chronic findings are detailed above. .   Electronically Signed   By: Onalee Hua  Call M.D.   On: 12/15/2012 10:40   Dg Chest 2 View  12/23/2012   CLINICAL DATA:  Fever and shortness of breath after knee replacement  EXAM: CHEST  2 VIEW  COMPARISON:  12/15/2012  FINDINGS: Cardiomegaly noted with a few interstitial Kerley B-lines peripherally and central vascular congestion. Lung volumes are suboptimal with crowding of the bronchovascular markings. Trace pleural fluid is present. No pneumothorax. No acute osseous abnormality.  IMPRESSION: Cardiomegaly with probable early interstitial edema and trace pleural effusions. Followup after diuresis may be helpful if there is continued clinical concern for pneumonia which could be masked by edema.   Electronically Signed   By: Christiana Pellant M.D.   On: 12/23/2012 20:28   Dg Chest 2 View  12/05/2012   CLINICAL DATA:  Preop total knee replacement.  EXAM: CHEST  2 VIEW  COMPARISON:  07/08/2012  FINDINGS: Heart and mediastinal contours are  within normal limits. No focal opacities or effusions. No acute bony abnormality. Degenerative changes in the thoracic spine.  IMPRESSION: No active cardiopulmonary disease.   Electronically Signed   By: Charlett Nose M.D.   On: 12/05/2012 15:33   Dg Knee Complete 4 Views Right  12/23/2012   CLINICAL DATA:  Right knee pain. Status post right knee arthroplasty on 12/12/2012.  EXAM: RIGHT KNEE - COMPLETE 4+ VIEW  COMPARISON:  None.  FINDINGS: Alignment of a total right knee arthroplasty appears normal. No fracture, dislocation or abnormal lucency is identified. The lateral projection does show some soft tissue swelling anterior to the patella. No obvious joint effusion is seen.  IMPRESSION: Normal appearance of right knee arthroplasty. Some soft tissue swelling is noted in the prepatellar region.   Electronically Signed   By: Irish Lack M.D.   On: 12/23/2012 18:42         Subjective: Patient complains of decreasing endurance and shortness of breath with exertion. Also complains of bilateral lower extremity edema. Denies any nausea, vomiting, diarrhea, abdominal pain, dysuria, hematuria. Denies any chest pain.  Objective: Filed Vitals:   12/23/12 2127 12/24/12 0013 12/24/12 0131 12/24/12 0500  BP: 108/46  149/58 123/65  Pulse: 67  82 68  Temp:   99 F (37.2 C) 99.4 F (37.4 C)  TempSrc:   Oral Oral  Resp: 14  20 20   Height:  5' 2.5" (1.588 m)    Weight:  76.204 kg (168 lb)    SpO2: 94%  99% 92%    Intake/Output Summary (Last 24 hours) at 12/24/12 1056 Last data filed at 12/24/12 0842  Gross per 24 hour  Intake    600 ml  Output    400 ml  Net    200 ml   Weight change:  Exam:   General:  Pt is alert, follows commands appropriately, not in acute distress  HEENT: No icterus, No thrush, [place]/AT  Cardiovascular: RRR, S1/S2, no rubs, no gallops  Respiratory: Bilateral crackles. No wheezing. Good air movement.  Abdomen: Soft/+BS, non tender, non distended, no  guarding  Extremities: 2+ LEedema R>L, No lymphangitis, No petechiae, No rashes, no synovitis  Data Reviewed: Basic Metabolic Panel:  Recent Labs Lab 12/24/12 0515  NA 137  K 4.2  CL 104  CO2 28  GLUCOSE 113*  BUN 13  CREATININE 0.57  CALCIUM 8.2*   Liver Function Tests:  Recent Labs Lab 12/23/12 1750  AST 33  ALT 35  ALKPHOS 560*  BILITOT 0.6  PROT 5.5*  ALBUMIN 2.7*   No results found for this basename: LIPASE, AMYLASE,  in the last 168 hours No results found for this basename: AMMONIA,  in the last 168 hours CBC:  Recent Labs Lab 12/23/12 1750 12/24/12 0515  WBC 8.1 8.1  NEUTROABS 6.2  --   HGB 10.6* 9.3*  HCT 32.9* 29.3*  MCV 94.0 95.4  PLT 310 262   Cardiac Enzymes: No results found for this basename: CKTOTAL, CKMB, CKMBINDEX, TROPONINI,  in the last 168 hours BNP: No components found with this basename: POCBNP,  CBG: No results found for this basename: GLUCAP,  in the last 168 hours  Recent Results (from the past 240 hour(s))  MRSA PCR SCREENING     Status: None   Collection Time    12/24/12  2:34 AM  Result Value Range Status   MRSA by PCR NEGATIVE  NEGATIVE Final   Comment:            The GeneXpert MRSA Assay (FDA     approved for NASAL specimens     only), is one component of a     comprehensive MRSA colonization     surveillance program. It is not     intended to diagnose MRSA     infection nor to guide or     monitor treatment for     MRSA infections.     Scheduled Meds: . cefTRIAXone (ROCEPHIN)  IV  1 g Intravenous Q24H  . docusate sodium  100 mg Oral BID  . [START ON 12/25/2012] nebivolol  5 mg Oral QODAY  . [START ON 12/25/2012] PARoxetine  10 mg Oral QODAY  . prednisoLONE acetate  1 drop Both Eyes BID  . rivaroxaban  10 mg Oral Q breakfast  . vancomycin  750 mg Intravenous Q12H   Continuous Infusions:    [name] [place], DO  [facility] Pager [phone]  If 7PM-7AM, please contact  night-coverage [email or web address] Password [email or web address] 12/24/2012, 10:56 AM   LOS: 1 day

## 2012-12-25 DIAGNOSIS — I369 Nonrheumatic tricuspid valve disorder, unspecified: Secondary | ICD-10-CM

## 2012-12-25 LAB — URINE CULTURE: Colony Count: 60000

## 2012-12-25 LAB — CBC
HCT: 28.6 % — ABNORMAL LOW (ref 36.0–46.0)
Hemoglobin: 9.3 g/dL — ABNORMAL LOW (ref 12.0–15.0)
MCV: 94.4 fL (ref 78.0–100.0)
RBC: 3.03 MIL/uL — ABNORMAL LOW (ref 3.87–5.11)
WBC: 7.7 10*3/uL (ref 4.0–10.5)

## 2012-12-25 LAB — BASIC METABOLIC PANEL
CO2: 30 mEq/L (ref 19–32)
Chloride: 100 mEq/L (ref 96–112)
Potassium: 3.3 mEq/L — ABNORMAL LOW (ref 3.5–5.1)
Sodium: 137 mEq/L (ref 135–145)

## 2012-12-25 MED ORDER — HYDROMORPHONE HCL 2 MG PO TABS
2.0000 mg | ORAL_TABLET | ORAL | Status: DC | PRN
  Administered 2012-12-25 – 2012-12-29 (×11): 2 mg via ORAL
  Filled 2012-12-25 (×12): qty 1

## 2012-12-25 MED ORDER — FUROSEMIDE 10 MG/ML IJ SOLN
40.0000 mg | Freq: Two times a day (BID) | INTRAMUSCULAR | Status: DC
  Administered 2012-12-25 (×2): 40 mg via INTRAVENOUS
  Filled 2012-12-25 (×5): qty 4

## 2012-12-25 MED ORDER — POTASSIUM CHLORIDE CRYS ER 20 MEQ PO TBCR
20.0000 meq | EXTENDED_RELEASE_TABLET | Freq: Every day | ORAL | Status: DC
  Administered 2012-12-25 – 2012-12-26 (×2): 20 meq via ORAL
  Filled 2012-12-25 (×2): qty 1

## 2012-12-25 MED ORDER — VITAMINS A & D EX OINT
TOPICAL_OINTMENT | CUTANEOUS | Status: AC
  Administered 2012-12-25: 1
  Filled 2012-12-25: qty 10

## 2012-12-25 MED ORDER — SENNA 8.6 MG PO TABS
1.0000 | ORAL_TABLET | Freq: Every day | ORAL | Status: DC
  Administered 2012-12-25 – 2012-12-27 (×3): 8.6 mg via ORAL
  Filled 2012-12-25 (×4): qty 1

## 2012-12-25 NOTE — Progress Notes (Signed)
[facility] PROGRESS NOTE  [name] [name] ZOX:[id number] DOB: 07-06-1924 DOA: 12/23/2012 PCP: Hollice Espy, MD  Brief history  79 y.o. female from the [facility] for Rehab therapy after a right Total Knee Arthoplasty on 09/22 who was brought to the ED due to increased pain redness and swelling of her right knee x 2 days. She reports feeling heat from the area and has Had increased stiffness. She was evaluated in the ED, and underwent an Ultrasound to evaluate for a possible DVT and the study was negative for a DVT.  The patient was found to be febrile with a soft blood pressures in the ED. The patient was started on Vancomycin after blood cultures were obtained. She was also found to have pyuria and ceftriaxone was empirically started pending urine cultures. Orthopedic surgery saw the patient, and they did not feel that the patient had any significant right knee joint effusion to suggest septic arthritis. She was continued on rivaroxaban for DVT prophylaxis.  The patient also related a history of gradual worsening dyspnea on exertion at [facility] home. In addition, the patient has had increased bilateral lower extremity edema and increased abdominal girth. Chest x-ray showed increased interstitial changes. The patient was started on intravenous furosemide for acute on chronic diastolic CHF. Echocardiogram was obtained.  Anemia panel was obtained and showed iron deficiency anemia with iron saturation of 10%. The patient was given ferrous sulfate and also a dose of nulecit.  Assessment/Plan:  Sepsis  -Secondary to cellulitis of the right lower extremity  Cellulitis right lower extremity  -Continue vancomycin  -Appreciate ortho followup -erythema has improved -Followup blood cultures--neg to date Acute on chronic diastolic CHF  -Increase IV furosemide to BID -Dyspnea worsened after IV fluids earlier in the admission -Daily weights  -Accurate I.'s and O.'s  -Patient has gained 6.4 kg since  12/05/2012 -Echocardiogram performed today, await results Pyuria  -Continue antibiotics  -Culture was polymicrobial Anemia  -B12 and folate--normal range -iron saturation 10%  -Give dose of Nulecit IV and start ferrous sulfate  Family Communication: Daughter at beside Disposition Plan: SNF when medically stable  Antibiotics:  Vancomycin 12/24/2012>>>  Ceftriaxone 12/24/2012>>>      Procedures/Studies: Dg Chest 1 View  12/15/2012   CLINICAL DATA:  History of sharp chest pains. Shortness of breath. Cough. Congestion. Postoperative.  EXAM: CHEST - 1 VIEW  COMPARISON:  12/05/2012.  FINDINGS: This cardiac silhouette is upper normal size. Ectasia and nonaneurysmal calcification of the thoracic aorta are seen. Mediastinal and hilar contours appear stable. No pulmonary infiltrates or masses are evident. No pleural abnormality is evident. No pneumothorax is evident. Osteophytes are present in the spine. There are cholecystectomy clips. .  IMPRESSION: No acute or active cardiopulmonary abnormalities are identified. Stable chronic findings are detailed above. .   Electronically Signed   By: Onalee Hua  Call M.D.   On: 12/15/2012 10:40   Dg Chest 2 View  12/23/2012   CLINICAL DATA:  Fever and shortness of breath after knee replacement  EXAM: CHEST  2 VIEW  COMPARISON:  12/15/2012  FINDINGS: Cardiomegaly noted with a few interstitial Kerley B-lines peripherally and central vascular congestion. Lung volumes are suboptimal with crowding of the bronchovascular markings. Trace pleural fluid is present. No pneumothorax. No acute osseous abnormality.  IMPRESSION: Cardiomegaly with probable early interstitial edema and trace pleural effusions. Followup after diuresis may be helpful if there is continued clinical concern for pneumonia which could be masked by edema.   Electronically Signed   By: Vinnie Langton  Green M.D.   On: 12/23/2012 20:28   Dg Chest 2 View  12/05/2012   CLINICAL DATA:  Preop total knee replacement.   EXAM: CHEST  2 VIEW  COMPARISON:  07/08/2012  FINDINGS: Heart and mediastinal contours are within normal limits. No focal opacities or effusions. No acute bony abnormality. Degenerative changes in the thoracic spine.  IMPRESSION: No active cardiopulmonary disease.   Electronically Signed   By: Charlett Nose M.D.   On: 12/05/2012 15:33   Dg Knee Complete 4 Views Right  12/23/2012   CLINICAL DATA:  Right knee pain. Status post right knee arthroplasty on 12/12/2012.  EXAM: RIGHT KNEE - COMPLETE 4+ VIEW  COMPARISON:  None.  FINDINGS: Alignment of a total right knee arthroplasty appears normal. No fracture, dislocation or abnormal lucency is identified. The lateral projection does show some soft tissue swelling anterior to the patella. No obvious joint effusion is seen.  IMPRESSION: Normal appearance of right knee arthroplasty. Some soft tissue swelling is noted in the prepatellar region.   Electronically Signed   By: Irish Lack M.D.   On: 12/23/2012 18:42         Subjective: Patient complains of dyspnea with exertion. Denies any dizziness, nausea, vomiting, diarrhea, abdominal pain. Complains of constipation. No dysuria.  Objective: Filed Vitals:   12/24/12 1538 12/24/12 1813 12/24/12 2114 12/25/12 0627  BP:   117/54 146/58  Pulse:   71 81  Temp: 99.1 F (37.3 C) 98.8 F (37.1 C) 98.2 F (36.8 C) 99.5 F (37.5 C)  TempSrc: Oral  Axillary Oral  Resp:   18 18  Height:      Weight:    83.1 kg (183 lb 3.2 oz)  SpO2:   95% 92%    Intake/Output Summary (Last 24 hours) at 12/25/12 0920 Last data filed at 12/24/12 1700  Gross per 24 hour  Intake    360 ml  Output    700 ml  Net   -340 ml   Weight change: 6.896 kg (15 lb 3.2 oz) Exam:   General:  Pt is alert, follows commands appropriately, not in acute distress  HEENT: No icterus, No thrush, /AT  Cardiovascular: RRR, S1/S2, no rubs, no gallops  Respiratory: Bilateral crackles. No wheezing. Good air movement.  Abdomen:  Soft/+BS, non tender, non distended, no guarding  Extremities: 2+ LE edema R>L, No lymphangitis, No petechiae, No rashes, no synovitis  Data Reviewed: Basic Metabolic Panel:  Recent Labs Lab 12/24/12 0515 12/25/12 0500  NA 137 137  K 4.2 3.3*  CL 104 100  CO2 28 30  GLUCOSE 113* 110*  BUN 13 9  CREATININE 0.57 0.54  CALCIUM 8.2* 8.5  MG  --  2.0   Liver Function Tests:  Recent Labs Lab 12/23/12 1750  AST 33  ALT 35  ALKPHOS 560*  BILITOT 0.6  PROT 5.5*  ALBUMIN 2.7*   No results found for this basename: LIPASE, AMYLASE,  in the last 168 hours No results found for this basename: AMMONIA,  in the last 168 hours CBC:  Recent Labs Lab 12/23/12 1750 12/24/12 0515 12/25/12 0500  WBC 8.1 8.1 7.7  NEUTROABS 6.2  --   --   HGB 10.6* 9.3* 9.3*  HCT 32.9* 29.3* 28.6*  MCV 94.0 95.4 94.4  PLT 310 262 260   Cardiac Enzymes: No results found for this basename: CKTOTAL, CKMB, CKMBINDEX, TROPONINI,  in the last 168 hours BNP: No components found with this basename: POCBNP,  CBG: No results  found for this basename: GLUCAP,  in the last 168 hours  Recent Results (from the past 240 hour(s))  CULTURE, BLOOD (ROUTINE X 2)     Status: None   Collection Time    12/23/12  5:50 PM      Result Value Range Status   Specimen Description BLOOD LEFT ARM   Final   Special Requests BOTTLES DRAWN AEROBIC AND ANAEROBIC   Final   Culture  Setup Time     Final   Value: 12/23/2012 22:00     Performed at [facility]   Culture     Final   Value:        BLOOD CULTURE RECEIVED NO GROWTH TO DATE CULTURE WILL BE HELD FOR 5 DAYS BEFORE ISSUING A FINAL NEGATIVE REPORT     Performed at [facility]   Report Status PENDING   Incomplete  CULTURE, BLOOD (ROUTINE X 2)     Status: None   Collection Time    12/23/12  6:55 PM      Result Value Range Status   Specimen Description BLOOD RIGHT ARM   Final   Special Requests BOTTLES DRAWN AEROBIC AND ANAEROBIC   Final    Culture  Setup Time     Final   Value: 12/23/2012 21:51     Performed at [facility]   Culture     Final   Value:        BLOOD CULTURE RECEIVED NO GROWTH TO DATE CULTURE WILL BE HELD FOR 5 DAYS BEFORE ISSUING A FINAL NEGATIVE REPORT     Performed at [facility]   Report Status PENDING   Incomplete  URINE CULTURE     Status: None   Collection Time    12/23/12  7:30 PM      Result Value Range Status   Specimen Description URINE, CLEAN CATCH   Final   Special Requests NONE   Final   Culture  Setup Time     Final   Value: 12/24/2012 02:38     Performed at [facility] Count     Final   Value: 60,000 COLONIES/ML     Performed at [facility]   Culture     Final   Value: Multiple bacterial morphotypes present, none predominant. Suggest appropriate recollection if clinically indicated.     Performed at [facility]   Report Status 12/25/2012 FINAL   Final  MRSA PCR SCREENING     Status: None   Collection Time    12/24/12  2:34 AM      Result Value Range Status   MRSA by PCR NEGATIVE  NEGATIVE Final   Comment:            The GeneXpert MRSA Assay (FDA     approved for NASAL specimens     only), is one component of a     comprehensive MRSA colonization     surveillance program. It is not     intended to diagnose MRSA     infection nor to guide or     monitor treatment for     MRSA infections.     Scheduled Meds: . cefTRIAXone (ROCEPHIN)  IV  1 g Intravenous Q24H  . docusate sodium  100 mg Oral BID  . ferrous sulfate  325 mg Oral BID WC  . furosemide  40 mg Intravenous BID  . nebivolol  5 mg Oral QODAY  .  PARoxetine  10 mg Oral QODAY  . prednisoLONE acetate  1 drop Both Eyes BID  . rivaroxaban  10 mg Oral Q breakfast  . senna  1 tablet Oral Daily  . vancomycin  750 mg Intravenous Q12H   Continuous Infusions:    Ogden Handlin, DO  [facility] Pager [phone]  If 7PM-7AM, please contact  night-coverage [email or web address] Password [email or web address] 12/25/2012, 9:20 AM   LOS: 2 days

## 2012-12-25 NOTE — Progress Notes (Signed)
Pt c/o increasing pain to R knee.  Dilaudid given earlier, tylenol given at this time.  C/o increased swelling to BLE and face - oxygen tubing beginning to indent into cheeks.  According to earlier notes MD is aware of fluid overload and increased furosemide dose, therefore will monitor pt closely for signs of overload but will not page at this time.  VSS, no dyspnea, voiding well.  Pt resting in bed with ice packs to knee.  Ardyth Gal, RN 12/25/2012

## 2012-12-25 NOTE — Progress Notes (Signed)
  Echocardiogram 2D Echocardiogram has been performed.  Estelle Grumbles 12/25/2012, 10:02 AM

## 2012-12-25 NOTE — Progress Notes (Signed)
Physical Therapy Treatment Patient Details Name: Aireana Ryland MRN: [id number] DOB: 1924/08/22 Today's Date: 12/25/2012 Time: 4098-1191 PT Time Calculation (min): 28 min  PT Assessment / Plan / Recommendation  History of Present Illness 79 y.o. female from the [facility] for Rehab therapy after a right Total Knee Arthoplasty on 09/22 who was brought to the ED due to increased pain redness and swelling of her right knee x 2 days.  She reports feeling heat from the area and has  Had increased stiffness.   She was evaluated in the ED, and underwent an Ultrasound to evaluate for a possible DVT and the study was negative for a DVT.  She was placed on IV antibiotics for Cellulitis, and Orthopedics was consulted and Dr. Ranell Patrick saw her in the ED.   She was felt to have an pneumonia and a UTI and was also placed on further antibiotic coverage to cover as well.  She was referred for admission.      PT Comments   Pt ambulated x 150 '. Pt and daughter state pt is interested in going home vs. Back to SNF. Pt is mobilizing well.  Follow Up Recommendations  Home health PT (daughter reports pt may DC to home as she was to leave SNF t)     Does the patient have the potential to tolerate intense rehabilitation     Barriers to Discharge        Equipment Recommendations       Recommendations for Other Services    Frequency Min 4X/week   Progress towards PT Goals Progress towards PT goals: Progressing toward goals  Plan Discharge plan needs to be updated    Precautions / Restrictions     Pertinent Vitals/Pain Tightness in knee.    Mobility  Bed Mobility Supine to Sit: 5: Supervision;HOB elevated Details for Bed Mobility Assistance: Increased time. assist for R LE off/onto bed.  Transfers Sit to Stand: 4: Min guard;With upper extremity assist;From bed;From chair/3-in-1;With armrests Stand to Sit: 4: Min guard;With upper extremity assist;To bed;To chair/3-in-1;With armrests Stand Pivot Transfers:  4: Min guard;With armrests Details for Transfer Assistance: pt assisted bed <> BSC with min/guard for safety, verbal cues for hand placement Ambulation/Gait Ambulation/Gait Assistance: 4: Min guard Ambulation Distance (Feet): 150 Feet Assistive device: Rolling walker Ambulation/Gait Assistance Details: pt moving well,  reciprocal gait. Gait Pattern: Step-through pattern    Exercises Total Joint Exercises Ankle Circles/Pumps: AROM;Both;10 reps;Supine Quad Sets: AROM;10 reps;Supine;Right Hip ABduction/ADduction: AROM;Right;5 reps;Supine Long Arc Quad: AROM;Right;10 reps;Seated Knee Flexion: AROM;Right;10 reps;Seated   PT Diagnosis:    PT Problem List:   PT Treatment Interventions:     PT Goals (current goals can now be found in the care plan section)    Visit Information  Last PT Received On: 12/25/12 History of Present Illness: 79 y.o. female from the [facility] for Rehab therapy after a right Total Knee Arthoplasty on 09/22 who was brought to the ED due to increased pain redness and swelling of her right knee x 2 days.  She reports feeling heat from the area and has  Had increased stiffness.   She was evaluated in the ED, and underwent an Ultrasound to evaluate for a possible DVT and the study was negative for a DVT.  She was placed on IV antibiotics for Cellulitis, and Orthopedics was consulted and Dr. Ranell Patrick saw her in the ED.   She was felt to have an pneumonia and a UTI and was also placed on further antibiotic coverage  to cover as well.  She was referred for admission.       Subjective Data      Cognition  Cognition Arousal/Alertness: Awake/alert    Balance     End of Session PT - End of Session Activity Tolerance: Patient tolerated treatment well Patient left: with call bell/phone within reach;with family/visitor present;in chair   GP     [name] 12/25/2012, 2:34 PM Blanchard Kelch PT [phone]

## 2012-12-25 NOTE — Progress Notes (Signed)
   Subjective:     recheck right knee  Pt having multiple episodes of sweating followed by chills Mild pain to right knee but able to flex without difficulty  Patient reports pain as moderate.  Objective:   VITALS:   Filed Vitals:   12/25/12 0627  BP: 146/58  Pulse: 81  Temp: 99.5 F (37.5 C)  Resp: 18    Right knee: incision healed well nv intact distally Bilateral legs feel warm Minimal erythema to right knee and able to flex without difficulty  LABS  Recent Labs  12/23/12 1750 12/24/12 0515 12/25/12 0500  HGB 10.6* 9.3* 9.3*  HCT 32.9* 29.3* 28.6*  WBC 8.1 8.1 7.7  PLT 310 262 260     Recent Labs  12/24/12 0515 12/25/12 0500  NA 137 137  K 4.2 3.3*  BUN 13 9  CREATININE 0.57 0.54  GLUCOSE 113* 110*     Assessment/Plan:    right knee cellulitis Continue IV antibiotics Will continue to monitor progress Echo to be performed this morning Activity as tolerated     Alphonsa Overall, MPAS, PA-C  12/25/2012, 7:50 AM

## 2012-12-26 LAB — BASIC METABOLIC PANEL
BUN: 8 mg/dL (ref 6–23)
CO2: 31 mEq/L (ref 19–32)
Chloride: 98 mEq/L (ref 96–112)
Glucose, Bld: 132 mg/dL — ABNORMAL HIGH (ref 70–99)
Potassium: 3.2 mEq/L — ABNORMAL LOW (ref 3.5–5.1)

## 2012-12-26 LAB — TROPONIN I: Troponin I: <0.3 ng/mL (ref <0.30)

## 2012-12-26 MED ORDER — NEBIVOLOL HCL 5 MG PO TABS
5.0000 mg | ORAL_TABLET | Freq: Every day | ORAL | Status: DC
  Administered 2012-12-26 – 2012-12-27 (×2): 5 mg via ORAL
  Filled 2012-12-26 (×3): qty 1

## 2012-12-26 MED ORDER — FUROSEMIDE 10 MG/ML IJ SOLN
60.0000 mg | Freq: Two times a day (BID) | INTRAMUSCULAR | Status: DC
  Administered 2012-12-26 – 2012-12-27 (×3): 60 mg via INTRAVENOUS
  Filled 2012-12-26 (×5): qty 6

## 2012-12-26 MED ORDER — POTASSIUM CHLORIDE CRYS ER 20 MEQ PO TBCR
20.0000 meq | EXTENDED_RELEASE_TABLET | Freq: Two times a day (BID) | ORAL | Status: DC
  Administered 2012-12-26 – 2012-12-29 (×6): 20 meq via ORAL
  Filled 2012-12-26 (×7): qty 1

## 2012-12-26 NOTE — Progress Notes (Addendum)
[facility] PROGRESS NOTE  [name] [name] ZOX:[id number] DOB: 1925-02-16 DOA: 12/23/2012 PCP: Hollice Espy, MD  Assessment/Plan: Sepsis  -Secondary to cellulitis of the right lower extremity  -No fever since 12/23/2012 Cellulitis right lower extremity  -Continue vancomycin while inpatient -Appreciate ortho followup  -erythema has improved  -Followup blood cultures--neg to date  Acute on chronic diastolic CHF  -Continue 40 mg IV furosemide BID  -Dyspnea worsened after IV fluids earlier in the admission  -Daily weights--today's weight is pending -Accurate I.'s and O.'s  -Patient has gained 6.4 kg since 12/05/2012  -Echocardiogram--EF 55-60%, grade 2 diastolic dysfunction, moderate TR -continue bystolic to daily Pyuria  -Continue ceftriaxone -Culture was polymicrobial  Anemia  -B12 and folate--normal range  -iron saturation 10%  -Give dose of Nulecit IV and start ferrous sulfate  Family Communication: Daughter at beside  Disposition Plan: SNF vs home  when medically stable  Antibiotics:  Vancomycin 12/24/2012>>>  Ceftriaxone 12/24/2012>>>          Procedures/Studies: Dg Chest 1 View  12/15/2012   CLINICAL DATA:  History of sharp chest pains. Shortness of breath. Cough. Congestion. Postoperative.  EXAM: CHEST - 1 VIEW  COMPARISON:  12/05/2012.  FINDINGS: This cardiac silhouette is upper normal size. Ectasia and nonaneurysmal calcification of the thoracic aorta are seen. Mediastinal and hilar contours appear stable. No pulmonary infiltrates or masses are evident. No pleural abnormality is evident. No pneumothorax is evident. Osteophytes are present in the spine. There are cholecystectomy clips. .  IMPRESSION: No acute or active cardiopulmonary abnormalities are identified. Stable chronic findings are detailed above. .   Electronically Signed   By: Onalee Hua  Call M.D.   On: 12/15/2012 10:40   Dg Chest 2 View  12/23/2012   CLINICAL DATA:  Fever and shortness of breath  after knee replacement  EXAM: CHEST  2 VIEW  COMPARISON:  12/15/2012  FINDINGS: Cardiomegaly noted with a few interstitial Kerley B-lines peripherally and central vascular congestion. Lung volumes are suboptimal with crowding of the bronchovascular markings. Trace pleural fluid is present. No pneumothorax. No acute osseous abnormality.  IMPRESSION: Cardiomegaly with probable early interstitial edema and trace pleural effusions. Followup after diuresis may be helpful if there is continued clinical concern for pneumonia which could be masked by edema.   Electronically Signed   By: Christiana Pellant M.D.   On: 12/23/2012 20:28   Dg Chest 2 View  12/05/2012   CLINICAL DATA:  Preop total knee replacement.  EXAM: CHEST  2 VIEW  COMPARISON:  07/08/2012  FINDINGS: Heart and mediastinal contours are within normal limits. No focal opacities or effusions. No acute bony abnormality. Degenerative changes in the thoracic spine.  IMPRESSION: No active cardiopulmonary disease.   Electronically Signed   By: Charlett Nose M.D.   On: 12/05/2012 15:33   Dg Knee Complete 4 Views Right  12/23/2012   CLINICAL DATA:  Right knee pain. Status post right knee arthroplasty on 12/12/2012.  EXAM: RIGHT KNEE - COMPLETE 4+ VIEW  COMPARISON:  None.  FINDINGS: Alignment of a total right knee arthroplasty appears normal. No fracture, dislocation or abnormal lucency is identified. The lateral projection does show some soft tissue swelling anterior to the patella. No obvious joint effusion is seen.  IMPRESSION: Normal appearance of right knee arthroplasty. Some soft tissue swelling is noted in the prepatellar region.   Electronically Signed   By: Irish Lack M.D.   On: 12/23/2012 18:42         Subjective: Patient states that she  is breathing better. She denies any fevers, chills, nausea, vomiting, diarrhea, abdominal pain, dysuria, hematuria. She has had intermittent chest discomfort.  Objective: Filed Vitals:   12/25/12 1320  12/25/12 2100 12/26/12 0157 12/26/12 0530  BP: 133/51 141/55 165/52 144/57  Pulse: 78 81 71 65  Temp: 98.6 F (37 C) 100.3 F (37.9 C) 98 F (36.7 C) 98.3 F (36.8 C)  TempSrc: Oral Oral Oral Oral  Resp: 18 18 20 18   Height:      Weight:      SpO2: 96% 95% 96% 95%    Intake/Output Summary (Last 24 hours) at 12/26/12 1047 Last data filed at 12/26/12 0210  Gross per 24 hour  Intake    560 ml  Output   1300 ml  Net   -740 ml   Weight change:  Exam:   General:  Pt is alert, follows commands appropriately, not in acute distress  HEENT: No icterus, No thrush,  [place]/AT  Cardiovascular: RRR, S1/S2, no rubs, no gallops  Respiratory: Bibasilar crackles, right greater than left. No wheezing. Good air movement.  Abdomen: Soft/+BS, non tender, non distended, no guarding  Extremities: R>L LE edema, No lymphangitis, No petechiae, No rashes, no synovitis  Data Reviewed: Basic Metabolic Panel:  Recent Labs Lab 12/24/12 0515 12/25/12 0500  NA 137 137  K 4.2 3.3*  CL 104 100  CO2 28 30  GLUCOSE 113* 110*  BUN 13 9  CREATININE 0.57 0.54  CALCIUM 8.2* 8.5  MG  --  2.0   Liver Function Tests:  Recent Labs Lab 12/23/12 1750  AST 33  ALT 35  ALKPHOS 560*  BILITOT 0.6  PROT 5.5*  ALBUMIN 2.7*   No results found for this basename: LIPASE, AMYLASE,  in the last 168 hours No results found for this basename: AMMONIA,  in the last 168 hours CBC:  Recent Labs Lab 12/23/12 1750 12/24/12 0515 12/25/12 0500  WBC 8.1 8.1 7.7  NEUTROABS 6.2  --   --   HGB 10.6* 9.3* 9.3*  HCT 32.9* 29.3* 28.6*  MCV 94.0 95.4 94.4  PLT 310 262 260   Cardiac Enzymes: No results found for this basename: CKTOTAL, CKMB, CKMBINDEX, TROPONINI,  in the last 168 hours BNP: No components found with this basename: POCBNP,  CBG: No results found for this basename: GLUCAP,  in the last 168 hours  Recent Results (from the past 240 hour(s))  CULTURE, BLOOD (ROUTINE X 2)     Status: None    Collection Time    12/23/12  5:50 PM      Result Value Range Status   Specimen Description BLOOD LEFT ARM   Final   Special Requests BOTTLES DRAWN AEROBIC AND ANAEROBIC   Final   Culture  Setup Time     Final   Value: 12/23/2012 22:00     Performed at [facility]   Culture     Final   Value:        BLOOD CULTURE RECEIVED NO GROWTH TO DATE CULTURE WILL BE HELD FOR 5 DAYS BEFORE ISSUING A FINAL NEGATIVE REPORT     Performed at [facility]   Report Status PENDING   Incomplete  CULTURE, BLOOD (ROUTINE X 2)     Status: None   Collection Time    12/23/12  6:55 PM      Result Value Range Status   Specimen Description BLOOD RIGHT ARM   Final   Special Requests BOTTLES DRAWN AEROBIC AND  ANAEROBIC   Final   Culture  Setup Time     Final   Value: 12/23/2012 21:51     Performed at [facility]   Culture     Final   Value:        BLOOD CULTURE RECEIVED NO GROWTH TO DATE CULTURE WILL BE HELD FOR 5 DAYS BEFORE ISSUING A FINAL NEGATIVE REPORT     Performed at [facility]   Report Status PENDING   Incomplete  URINE CULTURE     Status: None   Collection Time    12/23/12  7:30 PM      Result Value Range Status   Specimen Description URINE, CLEAN CATCH   Final   Special Requests NONE   Final   Culture  Setup Time     Final   Value: 12/24/2012 02:38     Performed at [facility] Count     Final   Value: 60,000 COLONIES/ML     Performed at [facility]   Culture     Final   Value: Multiple bacterial morphotypes present, none predominant. Suggest appropriate recollection if clinically indicated.     Performed at [facility]   Report Status 12/25/2012 FINAL   Final  MRSA PCR SCREENING     Status: None   Collection Time    12/24/12  2:34 AM      Result Value Range Status   MRSA by PCR NEGATIVE  NEGATIVE Final   Comment:            The GeneXpert MRSA Assay (FDA     approved for NASAL specimens     only), is one  component of a     comprehensive MRSA colonization     surveillance program. It is not     intended to diagnose MRSA     infection nor to guide or     monitor treatment for     MRSA infections.     Scheduled Meds: . cefTRIAXone (ROCEPHIN)  IV  1 g Intravenous Q24H  . docusate sodium  100 mg Oral BID  . ferrous sulfate  325 mg Oral BID WC  . furosemide  40 mg Intravenous BID  . nebivolol  5 mg Oral Daily  . PARoxetine  10 mg Oral QODAY  . potassium chloride  20 mEq Oral Daily  . prednisoLONE acetate  1 drop Both Eyes BID  . rivaroxaban  10 mg Oral Q breakfast  . senna  1 tablet Oral Daily  . vancomycin  750 mg Intravenous Q12H   Continuous Infusions:    Gunnar Hereford, DO  [facility] Pager [phone]  If 7PM-7AM, please contact night-coverage [email or web address] Password [email or web address] 12/26/2012, 10:47 AM   LOS: 3 days

## 2012-12-26 NOTE — Progress Notes (Signed)
ANTIBIOTIC CONSULT NOTE - FOLLOW UP  Pharmacy Consult for Vancomycin Indication: RLE Cellulitis   Allergies  Allergen Reactions  . Ultram [Tramadol] Hives  . Buprenex [Buprenorphine Hcl] Other (See Comments)    Heart raced  . Levofloxacin Other (See Comments)    levaquin unknown reaction  . Percocet [Oxycodone-Acetaminophen] Itching    "felt drained"  . Sulfa Antibiotics Other (See Comments)    unknown  . Tape     Adhesive tape causes skin to pull off  . Zetia [Ezetimibe] Other (See Comments)    unknown    Patient Measurements: Height: 5' 2.5" (158.8 cm) Weight: 174 lb 12.8 oz (79.289 kg) IBW/kg (Calculated) : 51.25  Vital Signs: Temp: 98.3 F (36.8 C) (10/06 0530) Temp src: Oral (10/06 0530) BP: 144/57 mmHg (10/06 0530) Pulse Rate: 65 (10/06 0530) Intake/Output from previous day: 10/05 0701 - 10/06 0700 In: 920 [P.O.:600; IV Piggyback:200] Out: 1300 [Urine:1300] Intake/Output from this shift: Total I/O In: 240 [P.O.:240] Out: 550 [Urine:550]  Labs:  Recent Labs  12/23/12 1750 12/24/12 0515 12/25/12 0500 12/26/12 1000  WBC 8.1 8.1 7.7  --   HGB 10.6* 9.3* 9.3*  --   PLT 310 262 260  --   CREATININE  --  0.57 0.54 0.49*   Estimated Creatinine Clearance: 50.7 ml/min (by C-G formula based on Cr of 0.49). No results found for this basename: VANCOTROUGH, Leodis Binet, VANCORANDOM, GENTTROUGH, GENTPEAK, GENTRANDOM, TOBRATROUGH, TOBRAPEAK, TOBRARND, AMIKACINPEAK, AMIKACINTROU, AMIKACIN,  in the last 72 hours   Microbiology: Recent Results (from the past 720 hour(s))  SURGICAL PCR SCREEN     Status: None   Collection Time    12/05/12  1:55 PM      Result Value Range Status   MRSA, PCR NEGATIVE  NEGATIVE Final   Staphylococcus aureus NEGATIVE  NEGATIVE Final   Comment:            The Xpert SA Assay (FDA     approved for NASAL specimens     in patients over 40 years of age),     is one component of     a comprehensive surveillance     program.  Test  performance has     been validated by [employer] for patients greater     than or equal to 20 year old.     It is not intended     to diagnose infection nor to     guide or monitor treatment.  CULTURE, BLOOD (ROUTINE X 2)     Status: None   Collection Time    12/23/12  5:50 PM      Result Value Range Status   Specimen Description BLOOD LEFT ARM   Final   Special Requests BOTTLES DRAWN AEROBIC AND ANAEROBIC   Final   Culture  Setup Time     Final   Value: 12/23/2012 22:00     Performed at [facility]   Culture     Final   Value:        BLOOD CULTURE RECEIVED NO GROWTH TO DATE CULTURE WILL BE HELD FOR 5 DAYS BEFORE ISSUING A FINAL NEGATIVE REPORT     Performed at [facility]   Report Status PENDING   Incomplete  CULTURE, BLOOD (ROUTINE X 2)     Status: None   Collection Time    12/23/12  6:55 PM      Result Value Range Status   Specimen Description BLOOD RIGHT  ARM   Final   Special Requests BOTTLES DRAWN AEROBIC AND ANAEROBIC   Final   Culture  Setup Time     Final   Value: 12/23/2012 21:51     Performed at [facility]   Culture     Final   Value:        BLOOD CULTURE RECEIVED NO GROWTH TO DATE CULTURE WILL BE HELD FOR 5 DAYS BEFORE ISSUING A FINAL NEGATIVE REPORT     Performed at [facility]   Report Status PENDING   Incomplete  URINE CULTURE     Status: None   Collection Time    12/23/12  7:30 PM      Result Value Range Status   Specimen Description URINE, CLEAN CATCH   Final   Special Requests NONE   Final   Culture  Setup Time     Final   Value: 12/24/2012 02:38     Performed at [facility] Count     Final   Value: 60,000 COLONIES/ML     Performed at [facility]   Culture     Final   Value: Multiple bacterial morphotypes present, none predominant. Suggest appropriate recollection if clinically indicated.     Performed at [facility]   Report Status 12/25/2012 FINAL   Final  MRSA PCR  SCREENING     Status: None   Collection Time    12/24/12  2:34 AM      Result Value Range Status   MRSA by PCR NEGATIVE  NEGATIVE Final   Comment:            The GeneXpert MRSA Assay (FDA     approved for NASAL specimens     only), is one component of a     comprehensive MRSA colonization     surveillance program. It is not     intended to diagnose MRSA     infection nor to guide or     monitor treatment for     MRSA infections.    Anti-infectives   Start     Dose/Rate Route Frequency Ordered Stop   12/24/12 2200  cefTRIAXone (ROCEPHIN) 1 g in dextrose 5 % 50 mL IVPB     1 g 100 mL/hr over 30 Minutes Intravenous Every 24 hours 12/23/12 2351     12/24/12 2100  azithromycin (ZITHROMAX) 500 mg in dextrose 5 % 250 mL IVPB  Status:  Discontinued     500 mg 250 mL/hr over 60 Minutes Intravenous Every 24 hours 12/23/12 2352 12/24/12 0029   12/24/12 1000  vancomycin (VANCOCIN) IVPB 750 mg/150 ml premix     750 mg 150 mL/hr over 60 Minutes Intravenous Every 12 hours 12/24/12 0527     12/23/12 2315  azithromycin (ZITHROMAX) 500 mg in dextrose 5 % 250 mL IVPB  Status:  Discontinued     500 mg 250 mL/hr over 60 Minutes Intravenous  Once 12/23/12 2312 12/24/12 0029   12/23/12 2115  cefTRIAXone (ROCEPHIN) 1 g in dextrose 5 % 50 mL IVPB     1 g 100 mL/hr over 30 Minutes Intravenous  Once 12/23/12 2102 12/23/12 2200   12/23/12 1945  vancomycin (VANCOCIN) IVPB 1000 mg/200 mL premix     1,000 mg 200 mL/hr over 60 Minutes Intravenous  Once 12/23/12 1930 12/23/12 2125      Assessment: 79 [facility]22 brought to ED 10/3 for  increased pain, redness, swelling of R knee x 2 days. U/S neg for DVT. Started on Vanc/Rocephin on 10/4 for sepsis, cellulitis, PNA, and UTI.   Day #3 Vancomycin 1g load on 10/4, then 750mg  IV q12h  Day #3 Rocephin 1g IV q24h per MD   Tmax 100.3, currently afebrile  WBC wnl  SCr stable at 0.54, CrCl ~50 ml/min   Blood cultures  negative to date, urine cx with multiple bacteria   Goal of Therapy:  Vancomycin trough level 15-20 mcg/ml  Plan:   Check vancomycin trough tonight at 23:30  Continue rocephin per MD for UTI Follow up renal function & cultures, duration of therapy  Loralee Pacas, PharmD, BCPS Pager: [phone] 12/26/2012,2:27 PM

## 2012-12-26 NOTE — Progress Notes (Signed)
Subjective: Pt without complaints related to knee   Objective: Vital signs in last 24 hours: Temp:  [98 F (36.7 C)-100.3 F (37.9 C)] 98.3 F (36.8 C) (10/06 0530) Pulse Rate:  [65-81] 65 (10/06 0530) Resp:  [18-20] 18 (10/06 0530) BP: (133-165)/(51-57) 144/57 mmHg (10/06 0530) SpO2:  [95 %-96 %] 95 % (10/06 0530)  Intake/Output from previous day: 10/05 0701 - 10/06 0700 In: 920 [P.O.:600; IV Piggyback:200] Out: 1300 [Urine:1300] Intake/Output this shift:     Recent Labs  12/23/12 1750 12/24/12 0515 12/25/12 0500  HGB 10.6* 9.3* 9.3*    Recent Labs  12/24/12 0515 12/25/12 0500  WBC 8.1 7.7  RBC 3.07*  3.08* 3.03*  HCT 29.3* 28.6*  PLT 262 260    Recent Labs  12/24/12 0515 12/25/12 0500  NA 137 137  K 4.2 3.3*  CL 104 100  CO2 28 30  BUN 13 9  CREATININE 0.57 0.54  GLUCOSE 113* 110*  CALCIUM 8.2* 8.5   No results found for this basename: LABPT, INR,  in the last 72 hours  No cellulitis present Knee without redness or swelling  Assessment/Plan: Right TKA- No signs of cellulitis or septic arthritis. Does not need antibiotics with respect to knee. Has follow up in office later this week   [name] [name] V 12/26/2012, 7:03 AM

## 2012-12-26 NOTE — Progress Notes (Signed)
Physical Therapy Treatment Patient Details Name: [name] [name] MRN: [id number] DOB: 1924-12-31 Today's Date: 12/26/2012 Time: 7829-5621 PT Time Calculation (min): 41 min  PT Assessment / Plan / Recommendation  History of Present Illness 79 y.o. female from the [facility] for Rehab therapy after a right Total Knee Arthoplasty on 09/22 who was brought to the ED due to increased pain redness and swelling of her right knee x 2 days.  She reports feeling heat from the area and has  Had increased stiffness.   She was evaluated in the ED, and underwent an Ultrasound to evaluate for a possible DVT and the study was negative for a DVT.  She was placed on IV antibiotics for Cellulitis, and Orthopedics was consulted and Dr. Ranell Patrick saw her in the ED.   She was felt to have an pneumonia and a UTI and was also placed on further antibiotic coverage to cover as well.  She was referred for admission.      PT Comments   Pt improving in mobility. Pt mildly dyspneic while walking. Encouraged pursed lip breathing.? Monitor picking up sats while walking as dropped to 79. Appeared **% when checked on dynamap after return to room. Marland Kitchen Pt/family considering DC to home. Will need HHPT. Will practice steps in AM.  Follow Up Recommendations  Home health PT     Does the patient have the potential to tolerate intense rehabilitation     Barriers to Discharge        Equipment Recommendations  None recommended by PT    Recommendations for Other Services    Frequency Min 4X/week   Progress towards PT Goals Progress towards PT goals: Progressing toward goals  Plan Current plan remains appropriate    Precautions / Restrictions Precautions Precautions: Knee;Fall Precaution Comments: monitor sats       Mobility  Bed Mobility Supine to Sit: 5: Supervision;HOB flat Sit to Supine: 5: Supervision;HOB flat Details for Bed Mobility Assistance: got right up with no assist. Transfers Sit to Stand: 5: Supervision;From  bed;From chair/3-in-1 Stand to Sit: 5: Supervision;To chair/3-in-1;To bed Details for Transfer Assistance: pt able to take steps without RW to get to [employer] Ambulation/Gait Ambulation/Gait Assistance: 4: Min guard Ambulation Distance (Feet): 200 Feet Assistive device: Rolling walker Gait Pattern: Step-through pattern Gait velocity: gait is speedy, had pt stop several tinmes to check SATs    Exercises Total Joint Exercises Quad Sets: AROM;10 reps;Supine;Right Heel Slides: 10 reps;Supine;AROM;Both;Other (comment) Straight Leg Raises: AROM;Right;10 reps;Supine Knee Flexion: AROM;Right;10 reps;Seated Goniometric ROM: 10-70   PT Diagnosis:    PT Problem List:   PT Treatment Interventions:     PT Goals (current goals can now be found in the care plan section)    Visit Information  Last PT Received On: 12/26/12 Assistance Needed: +1 History of Present Illness: 79 y.o. female from the [facility] for Rehab therapy after a right Total Knee Arthoplasty on 09/22 who was brought to the ED due to increased pain redness and swelling of her right knee x 2 days.  She reports feeling heat from the area and has  Had increased stiffness.   She was evaluated in the ED, and underwent an Ultrasound to evaluate for a possible DVT and the study was negative for a DVT.  She was placed on IV antibiotics for Cellulitis, and Orthopedics was consulted and Dr. Ranell Patrick saw her in the ED.   She was felt to have an pneumonia and a UTI and was also placed on further antibiotic  coverage to cover as well.  She was referred for admission.       Subjective Data      Cognition       Balance     End of Session PT - End of Session Activity Tolerance: Patient tolerated treatment well Patient left: with call bell/phone within reach;with family/visitor present;in bed   GP     [name] 12/26/2012, 3:30 PM Blanchard Kelch PT [phone]

## 2012-12-27 ENCOUNTER — Telehealth: Payer: Self-pay | Admitting: Internal Medicine

## 2012-12-27 DIAGNOSIS — I498 Other specified cardiac arrhythmias: Secondary | ICD-10-CM

## 2012-12-27 LAB — BASIC METABOLIC PANEL
CO2: 29 mEq/L (ref 19–32)
Calcium: 9.2 mg/dL (ref 8.4–10.5)
Creatinine, Ser: 0.51 mg/dL (ref 0.50–1.10)
GFR calc non Af Amer: 85 mL/min — ABNORMAL LOW (ref >90)
Glucose, Bld: 102 mg/dL — ABNORMAL HIGH (ref 70–99)

## 2012-12-27 MED ORDER — VANCOMYCIN HCL IN DEXTROSE 1-5 GM/200ML-% IV SOLN
1000.0000 mg | Freq: Two times a day (BID) | INTRAVENOUS | Status: DC
  Administered 2012-12-27 – 2012-12-29 (×4): 1000 mg via INTRAVENOUS
  Filled 2012-12-27 (×5): qty 200

## 2012-12-27 MED ORDER — NEBIVOLOL HCL 5 MG PO TABS
5.0000 mg | ORAL_TABLET | Freq: Every day | ORAL | Status: DC
  Administered 2012-12-27: 5 mg via ORAL
  Filled 2012-12-27 (×3): qty 1

## 2012-12-27 MED ORDER — CARVEDILOL 6.25 MG PO TABS
6.2500 mg | ORAL_TABLET | Freq: Two times a day (BID) | ORAL | Status: DC
  Filled 2012-12-27 (×2): qty 1

## 2012-12-27 NOTE — Telephone Encounter (Deleted)
ERROR

## 2012-12-27 NOTE — Progress Notes (Signed)
Pt desating to 85% on RA while sleeping. Pt placed on 1L O2 and sats remained above 90 on cont pulse ox. Will continue to monitor.  Earnest Conroy. Clelia Croft, RN

## 2012-12-27 NOTE — Progress Notes (Addendum)
[facility] PROGRESS NOTE  Lurlean Kernen ZOX:[id number] DOB: October 21, 1924 DOA: 12/23/2012 PCP: Hollice Espy, MD 79 y.o. female from the [facility] for Rehab therapy after a right Total Knee Arthoplasty on 09/22 who was brought to the ED due to increased pain redness and swelling of her right knee x 2 days. She reports feeling heat from the area and has Had increased stiffness. She was evaluated in the ED, and underwent an Ultrasound to evaluate for a possible DVT and the study was negative for a DVT.  The patient was found to be febrile with a soft blood pressures in the ED. The patient was started on Vancomycin after blood cultures were obtained. She was also found to have pyuria and ceftriaxone was empirically started pending urine cultures. Orthopedic surgery saw the patient, and they did not feel that the patient had any significant right knee joint effusion to suggest septic arthritis. She was continued on rivaroxaban for DVT prophylaxis.  The patient also related a history of gradual worsening dyspnea on exertion at [facility] home. In addition, the patient has had increased bilateral lower extremity edema and increased abdominal girth. Chest x-ray showed increased interstitial changes. The patient was started on intravenous furosemide for acute on chronic diastolic CHF. Echocardiogram was obtained.  Anemia panel was obtained and showed iron deficiency anemia with iron saturation of 10%. The patient was given ferrous sulfate and also a dose of nulecit. On 12/27/2012, the family requested a cardiology consultation which was obtained.   Assessment/Plan: Sepsis  -Secondary to cellulitis of the right lower extremity  -No fever since 12/23/2012  Cellulitis right lower extremity  -improved -Continue vancomycin while inpatient  -Appreciate ortho followup  -erythema has improved  -Followup blood cultures--neg to date  Acute on chronic diastolic CHF  -Decompensation precipitated by the  patient's cellulitis -Continue 60 mg IV furosemide BID  -Plan to switch to oral furosemide 12/28/2012 -Dyspnea worsened after IV fluids earlier in the admission  -Daily weights--today's weight is pending  -Accurate I.'s and O.'s  -Patient had gained 6.4 kg since 12/05/2012  -Negative 4kg since beginning of admission (dry weight= 76.8 kg) -Echocardiogram--EF 55-60%, grade 2 diastolic dysfunction, moderate TR  -Switch Bystolic to carvedilol due to mortality benefit -Family desires cardiology evaluation which I have consulted Hypoxemia -resolved -100% on RA Pyuria  -Continue ceftriaxone as the patient related a history of symptoms -Culture was polymicrobial, 60K organism -Today is day #4/7 of therapy Anemia  -B12 and folate--normal range  -iron saturation 10%  -Give dose of Nulecit IV and start ferrous sulfate  Night sweats -Likely due to the patient's Paxil Family Communication: Daughter at beside  Disposition Plan: home in 24 hrs if okay with cardiology Antibiotics:  Vancomycin 12/24/2012>>>  Ceftriaxone 12/24/2012>>>          Procedures/Studies: Dg Chest 1 View  12/15/2012   CLINICAL DATA:  History of sharp chest pains. Shortness of breath. Cough. Congestion. Postoperative.  EXAM: CHEST - 1 VIEW  COMPARISON:  12/05/2012.  FINDINGS: This cardiac silhouette is upper normal size. Ectasia and nonaneurysmal calcification of the thoracic aorta are seen. Mediastinal and hilar contours appear stable. No pulmonary infiltrates or masses are evident. No pleural abnormality is evident. No pneumothorax is evident. Osteophytes are present in the spine. There are cholecystectomy clips. .  IMPRESSION: No acute or active cardiopulmonary abnormalities are identified. Stable chronic findings are detailed above. .   Electronically Signed   By: Onalee Hua  Call M.D.   On: 12/15/2012 10:40   Dg  Chest 2 View  12/23/2012   CLINICAL DATA:  Fever and shortness of breath after knee replacement  EXAM:  CHEST  2 VIEW  COMPARISON:  12/15/2012  FINDINGS: Cardiomegaly noted with a few interstitial Kerley B-lines peripherally and central vascular congestion. Lung volumes are suboptimal with crowding of the bronchovascular markings. Trace pleural fluid is present. No pneumothorax. No acute osseous abnormality.  IMPRESSION: Cardiomegaly with probable early interstitial edema and trace pleural effusions. Followup after diuresis may be helpful if there is continued clinical concern for pneumonia which could be masked by edema.   Electronically Signed   By: Christiana Pellant M.D.   On: 12/23/2012 20:28   Dg Chest 2 View  12/05/2012   CLINICAL DATA:  Preop total knee replacement.  EXAM: CHEST  2 VIEW  COMPARISON:  07/08/2012  FINDINGS: Heart and mediastinal contours are within normal limits. No focal opacities or effusions. No acute bony abnormality. Degenerative changes in the thoracic spine.  IMPRESSION: No active cardiopulmonary disease.   Electronically Signed   By: Charlett Nose M.D.   On: 12/05/2012 15:33   Dg Knee Complete 4 Views Right  12/23/2012   CLINICAL DATA:  Right knee pain. Status post right knee arthroplasty on 12/12/2012.  EXAM: RIGHT KNEE - COMPLETE 4+ VIEW  COMPARISON:  None.  FINDINGS: Alignment of a total right knee arthroplasty appears normal. No fracture, dislocation or abnormal lucency is identified. The lateral projection does show some soft tissue swelling anterior to the patella. No obvious joint effusion is seen.  IMPRESSION: Normal appearance of right knee arthroplasty. Some soft tissue swelling is noted in the prepatellar region.   Electronically Signed   By: Irish Lack M.D.   On: 12/23/2012 18:42         Subjective: Patient complains of some night sweats. She denies any fevers, chills, chest discomfort, shortness breath, nausea, vomiting, diarrhea, abdominal pain.  Objective: Filed Vitals:   12/26/12 1144 12/26/12 1434 12/26/12 2200 12/27/12 0655  BP:  153/60 148/54  169/66  Pulse:  81 73 67  Temp:  98.1 F (36.7 C) 98.5 F (36.9 C) 98.3 F (36.8 C)  TempSrc:  Oral Oral Oral  Resp:  18 18 18   Height:      Weight: 79.289 kg (174 lb 12.8 oz)   79.2 kg (174 lb 9.7 oz)  SpO2:  92% 96% 100%    Intake/Output Summary (Last 24 hours) at 12/27/12 1340 Last data filed at 12/27/12 1053  Gross per 24 hour  Intake   1320 ml  Output   2350 ml  Net  -1030 ml   Weight change:  Exam:   General:  Pt is alert, follows commands appropriately, not in acute distress  HEENT: No icterus, No thrush,  Veteran/AT  Cardiovascular: RRR, S1/S2, no rubs, no gallops  Respiratory: CTA bilaterally, no wheezing, no crackles, no rhonchi  Abdomen: Soft/+BS, non tender, non distended, no guarding  Extremities: 1+ edema, No lymphangitis, No petechiae, No rashes, no synovitis  Data Reviewed: Basic Metabolic Panel:  Recent Labs Lab 12/24/12 0515 12/25/12 0500 12/26/12 1000 12/27/12 0501  NA 137 137 137 137  K 4.2 3.3* 3.2* 3.7  CL 104 100 98 100  CO2 28 30 31 29   GLUCOSE 113* 110* 132* 102*  BUN 13 9 8 9   CREATININE 0.57 0.54 0.49* 0.51  CALCIUM 8.2* 8.5 9.0 9.2  MG  --  2.0  --   --    Liver Function Tests:  Recent Labs Lab  12/23/12 1750  AST 33  ALT 35  ALKPHOS 560*  BILITOT 0.6  PROT 5.5*  ALBUMIN 2.7*   No results found for this basename: LIPASE, AMYLASE,  in the last 168 hours No results found for this basename: AMMONIA,  in the last 168 hours CBC:  Recent Labs Lab 12/23/12 1750 12/24/12 0515 12/25/12 0500  WBC 8.1 8.1 7.7  NEUTROABS 6.2  --   --   HGB 10.6* 9.3* 9.3*  HCT 32.9* 29.3* 28.6*  MCV 94.0 95.4 94.4  PLT 310 262 260   Cardiac Enzymes:  Recent Labs Lab 12/26/12 1000 12/26/12 1653  TROPONINI <0.30 <0.30   BNP: No components found with this basename: POCBNP,  CBG: No results found for this basename: GLUCAP,  in the last 168 hours  Recent Results (from the past 240 hour(s))  CULTURE, BLOOD (ROUTINE X 2)     Status:  None   Collection Time    12/23/12  5:50 PM      Result Value Range Status   Specimen Description BLOOD LEFT ARM   Final   Special Requests BOTTLES DRAWN AEROBIC AND ANAEROBIC   Final   Culture  Setup Time     Final   Value: 12/23/2012 22:00     Performed at [facility]   Culture     Final   Value:        BLOOD CULTURE RECEIVED NO GROWTH TO DATE CULTURE WILL BE HELD FOR 5 DAYS BEFORE ISSUING A FINAL NEGATIVE REPORT     Performed at [facility]   Report Status PENDING   Incomplete  CULTURE, BLOOD (ROUTINE X 2)     Status: None   Collection Time    12/23/12  6:55 PM      Result Value Range Status   Specimen Description BLOOD RIGHT ARM   Final   Special Requests BOTTLES DRAWN AEROBIC AND ANAEROBIC   Final   Culture  Setup Time     Final   Value: 12/23/2012 21:51     Performed at [facility]   Culture     Final   Value:        BLOOD CULTURE RECEIVED NO GROWTH TO DATE CULTURE WILL BE HELD FOR 5 DAYS BEFORE ISSUING A FINAL NEGATIVE REPORT     Performed at [facility]   Report Status PENDING   Incomplete  URINE CULTURE     Status: None   Collection Time    12/23/12  7:30 PM      Result Value Range Status   Specimen Description URINE, CLEAN CATCH   Final   Special Requests NONE   Final   Culture  Setup Time     Final   Value: 12/24/2012 02:38     Performed at [facility] Count     Final   Value: 60,000 COLONIES/ML     Performed at [facility]   Culture     Final   Value: Multiple bacterial morphotypes present, none predominant. Suggest appropriate recollection if clinically indicated.     Performed at [facility]   Report Status 12/25/2012 FINAL   Final  MRSA PCR SCREENING     Status: None   Collection Time    12/24/12  2:34 AM      Result Value Range Status   MRSA by PCR NEGATIVE  NEGATIVE Final   Comment:  The GeneXpert MRSA Assay (FDA     approved for NASAL specimens     only), is  one component of a     comprehensive MRSA colonization     surveillance program. It is not     intended to diagnose MRSA     infection nor to guide or     monitor treatment for     MRSA infections.     Scheduled Meds: . cefTRIAXone (ROCEPHIN)  IV  1 g Intravenous Q24H  . docusate sodium  100 mg Oral BID  . ferrous sulfate  325 mg Oral BID WC  . furosemide  60 mg Intravenous BID  . nebivolol  5 mg Oral Daily  . PARoxetine  10 mg Oral QODAY  . potassium chloride  20 mEq Oral BID  . prednisoLONE acetate  1 drop Both Eyes BID  . rivaroxaban  10 mg Oral Q breakfast  . senna  1 tablet Oral Daily  . vancomycin  1,000 mg Intravenous Q12H   Continuous Infusions:    Flornce Record, DO  [facility] Pager [phone]  If 7PM-7AM, please contact night-coverage [email or web address] Password [email or web address] 12/27/2012, 1:40 PM   LOS: 4 days

## 2012-12-27 NOTE — Progress Notes (Addendum)
ANTIBIOTIC CONSULT NOTE - FOLLOW UP  Pharmacy Consult for Vancomycin Indication: Cellulitis, sepsis  Allergies  Allergen Reactions  . Ultram [Tramadol] Hives  . Buprenex [Buprenorphine Hcl] Other (See Comments)    Heart raced  . Levofloxacin Other (See Comments)    levaquin unknown reaction  . Percocet [Oxycodone-Acetaminophen] Itching    "felt drained"  . Sulfa Antibiotics Other (See Comments)    unknown  . Tape     Adhesive tape causes skin to pull off  . Zetia [Ezetimibe] Other (See Comments)    unknown    Patient Measurements: Height: 5' 2.5" (158.8 cm) Weight: 174 lb 9.7 oz (79.2 kg) IBW/kg (Calculated) : 51.25  Vital Signs: Temp: 98.3 F (36.8 C) (10/07 0655) Temp src: Oral (10/07 0655) BP: 169/66 mmHg (10/07 0655) Pulse Rate: 67 (10/07 0655) Intake/Output from previous day: 10/06 0701 - 10/07 0700 In: 1560 [P.O.:960; IV Piggyback:600] Out: 2400 [Urine:2400] Intake/Output from this shift: Total I/O In: -  Out: 500 [Urine:500]  Labs:  Recent Labs  12/25/12 0500 12/26/12 1000 12/27/12 0501  WBC 7.7  --   --   HGB 9.3*  --   --   PLT 260  --   --   CREATININE 0.54 0.49* 0.51   Estimated Creatinine Clearance: 50.7 ml/min (by C-G formula based on Cr of 0.51).  57 ml/min/1.56m2 (normalized)  Recent Labs  12/27/12 1120  VANCOTROUGH 8.3*     Microbiology: Recent Results (from the past 720 hour(s))  SURGICAL PCR SCREEN     Status: None   Collection Time    12/05/12  1:55 PM      Result Value Range Status   MRSA, PCR NEGATIVE  NEGATIVE Final   Staphylococcus aureus NEGATIVE  NEGATIVE Final   Comment:            The Xpert SA Assay (FDA     approved for NASAL specimens     in patients over 30 years of age),     is one component of     a comprehensive surveillance     program.  Test performance has     been validated by [employer] for patients greater     than or equal to 81 year old.     It is not intended     to diagnose infection nor  to     guide or monitor treatment.  CULTURE, BLOOD (ROUTINE X 2)     Status: None   Collection Time    12/23/12  5:50 PM      Result Value Range Status   Specimen Description BLOOD LEFT ARM   Final   Special Requests BOTTLES DRAWN AEROBIC AND ANAEROBIC   Final   Culture  Setup Time     Final   Value: 12/23/2012 22:00     Performed at [facility]   Culture     Final   Value:        BLOOD CULTURE RECEIVED NO GROWTH TO DATE CULTURE WILL BE HELD FOR 5 DAYS BEFORE ISSUING A FINAL NEGATIVE REPORT     Performed at [facility]   Report Status PENDING   Incomplete  CULTURE, BLOOD (ROUTINE X 2)     Status: None   Collection Time    12/23/12  6:55 PM      Result Value Range Status   Specimen Description BLOOD RIGHT ARM   Final   Special Requests BOTTLES DRAWN AEROBIC AND  ANAEROBIC   Final   Culture  Setup Time     Final   Value: 12/23/2012 21:51     Performed at [facility]   Culture     Final   Value:        BLOOD CULTURE RECEIVED NO GROWTH TO DATE CULTURE WILL BE HELD FOR 5 DAYS BEFORE ISSUING A FINAL NEGATIVE REPORT     Performed at [facility]   Report Status PENDING   Incomplete  URINE CULTURE     Status: None   Collection Time    12/23/12  7:30 PM      Result Value Range Status   Specimen Description URINE, CLEAN CATCH   Final   Special Requests NONE   Final   Culture  Setup Time     Final   Value: 12/24/2012 02:38     Performed at [facility] Count     Final   Value: 60,000 COLONIES/ML     Performed at [facility]   Culture     Final   Value: Multiple bacterial morphotypes present, none predominant. Suggest appropriate recollection if clinically indicated.     Performed at [facility]   Report Status 12/25/2012 FINAL   Final  MRSA PCR SCREENING     Status: None   Collection Time    12/24/12  2:34 AM      Result Value Range Status   MRSA by PCR NEGATIVE  NEGATIVE Final   Comment:             The GeneXpert MRSA Assay (FDA     approved for NASAL specimens     only), is one component of a     comprehensive MRSA colonization     surveillance program. It is not     intended to diagnose MRSA     infection nor to guide or     monitor treatment for     MRSA infections.    Anti-infectives: 10/4 >> Rocephin >> 10/4 >> Vancomycin >>  Assessment: 79 yoF [facility] after TKA 9/22 brought to ED 10/3 for increased pain, redness, swelling of R knee x 2 days. Started on antibiotics for sepsis, cellulitis and rule out PNA/UTI  Day #4 Vancomycin and Rocephin  Vancomycin trough subtherapeutic (8.3) on 750mg  IV q12h  SCr wnl and stable  WBC wnl on 10/5  Afebrile since 10/5  Blood cultures neg to date, urine with multiple bacteria  Goal of Therapy:  Vancomycin trough level 15-20 mcg/ml  Plan:   Increase vancomycin to 1g IV q12h, recheck trough at steady state if continues  Continue rocephin per MD Follow up renal function & cultures, duration of therapy  Loralee Pacas, PharmD, BCPS Pager: [phone] 12/27/2012,12:51 PM

## 2012-12-27 NOTE — Care Management Note (Addendum)
    Page 1 of 2   12/29/2012     12:07:34 PM   CARE MANAGEMENT NOTE 12/29/2012  Patient:  [name],[name]   Account Number:  [id number]  Date Initiated:  12/24/2012  Documentation initiated by:  [facility]  Subjective/Objective Assessment:   79 year old female admitted with sepsis.     Action/Plan:   [facility].   Anticipated DC Date:  12/29/2012   Anticipated DC Plan:  HOME W HOME HEALTH SERVICES  In-house referral  Clinical Social Worker      DC Planning Services  CM consult      Choice offered to / List presented to:  C-4 Adult Children        HH arranged  HH-1 RN  HH-2 PT      [facility] agency  [facility]   Status of service:  Completed, signed off Medicare Important Message given?  NA - LOS <3 / Initial given by admissions (If response is "NO", the following Medicare IM given date fields will be blank) Date Medicare IM given:   Date Additional Medicare IM given:    Discharge Disposition:  HOME W HOME HEALTH SERVICES  Per UR Regulation:  Reviewed for med. necessity/level of care/duration of stay  If discussed at Long Length of Stay Meetings, dates discussed:   12/29/2012    Comments:  12/29/12 Aarav Burgett RN,BSN NCM [phone] D/C HOME W/HH.HH ORDERS ALREADY PUT IN.[facility] DEBBIE REP MADE AWARE.  12/28/12 Abraham Margulies RN,BSN NCM [phone] CARDIO FOLLOWING-CHF, TRANSITIONED TO PO LASIX.RLE CELLULITIS,UTI.[facility] FOLLOWING REP DEBBIE AWARE, ALREADY HAS HHRN/PT.  12/27/12 Clell Trahan RN,BSN NCM [phone] PER DTR ONLY NEED HHRN/PT.[facility] AWARE OF HH ORDERS. PATIENT/FAMILY PLANS TO D/C HOME W/HH.RLE CELLULITIS-IMPROVING-IV ABX.CHF-IV LASIX.02 SATS 85%RA-1L 02 UP TO 90%.D/C PLAN HOME W/HH.[facility] REP DEBBIE  ALREADY FOLLOWING.RECOMMEND HHRN/PT/OT/NURSE'S AIDE.AWAITING FINAL HH ORDERS.

## 2012-12-27 NOTE — Consult Note (Signed)
CARDIOLOGY CONSULT NOTE  Patient ID: [name] [name] MRN: [id number], DOB/AGE: 1924-06-06   Admit date: 12/23/2012 Date of Consult: 12/27/2012   Primary Physician: Hollice Espy, MD Primary Cardiologist: Tobias Alexander, H  Pt. Profile  CHF  Problem List  Past Medical History  Diagnosis Date  . Arthritis   . Blind left eye     Corneal transplant x3 with rejection  . Dysrhythmia   . Varicose veins     both legs and left fooot at arch  . Complication of anesthesia     sensitive to meds, bp can fall  . SVT (supraventricular tachycardia) 12/15/2011    Past Surgical History  Procedure Laterality Date  . Cholecystectomy    . Tonsillectomy    . Tonsillectomy    . Corneal transplant  right 2009, left 2009    both eyes, 3 in left eye, 1 in right eye  . Cataract surgery  2005    both eyes  . Abdominal hysterectomy   sept 1985    ovaries removed  . Total knee arthroplasty Left 06/27/2012    Procedure: LEFT TOTAL KNEE ARTHROPLASTY;  Surgeon: Loanne Drilling, MD;  Location: [facility] ORS;  Service: Orthopedics;  Laterality: Left;  . Tubal ligation    . Total knee arthroplasty Right 12/12/2012    Procedure: RIGHT TOTAL KNEE ARTHROPLASTY;  Surgeon: Loanne Drilling, MD;  Location: [facility] OR[facility];  Service: Orthopedics;  Laterality: Right;     Allergies  Allergies  Allergen Reactions  . Ultram [Tramadol] Hives  . Buprenex [Buprenorphine Hcl] Other (See Comments)    Heart raced  . Levofloxacin Other (See Comments)    levaquin unknown reaction  . Percocet [Oxycodone-Acetaminophen] Itching    "felt drained"  . Sulfa Antibiotics Other (See Comments)    unknown  . Tape     Adhesive tape causes skin to pull off  . Zetia [Ezetimibe] Other (See Comments)    unknown    HPI   79 year old female with h/o hypertension post total knee arthroplasty on 12/12/2012 who was brought from  the [facility] for Rehab therapy due to increased pain redness and swelling of her right knee as well as fever.  She was found to be septic and is being treated with iv antibiotics. The patient also complained of worsening dyspnea on exertion at [facility]. In addition, the patient has had increased bilateral lower extremity edema and increased abdominal girth. Chest x-ray showed increased interstitial changes. The patient was started on intravenous furosemide for acute on chronic diastolic CHF.   On 12/27/2012, the family requested a cardiology consultation which was obtained. 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   Inpatient Medications  . carvedilol  6.25 mg Oral BID WC  . cefTRIAXone (ROCEPHIN)  IV  1 g Intravenous Q24H  . docusate sodium  100 mg Oral BID  . ferrous sulfate  325 mg Oral BID WC  . furosemide  60 mg Intravenous BID  . PARoxetine  10 mg Oral QODAY  . potassium chloride  20 mEq Oral BID  . prednisoLONE acetate  1 drop Both Eyes BID  . rivaroxaban  10 mg Oral Q breakfast  . senna  1 tablet Oral Daily  . vancomycin  1,000 mg Intravenous Q12H   Family History Family History  Problem Relation Age of Onset  . Stroke  Mother   . Heart attack Father   . Heart attack Brother   . Cancer Brother     Social History History   Social History  . Marital Status: Widowed    Spouse Name: N/A    Number of Children: N/A  . Years of Education: N/A   Occupational History  . Not on file.   Social History Main Topics  . Smoking status: Never Smoker   . Smokeless tobacco: Never Used  . Alcohol Use: No  . Drug Use: No  . Sexual Activity: No   Other Topics Concern  . Not on file   Social History Narrative  . No narrative on file    Review of Systems  General:  No chills, fever, night sweats or weight changes.  Cardiovascular:  No chest pain, dyspnea on exertion, edema, orthopnea, palpitations, paroxysmal nocturnal  dyspnea. Dermatological: No rash, lesions/masses Respiratory: No cough, dyspnea Urologic: No hematuria, dysuria Abdominal:   No nausea, vomiting, diarrhea, bright red blood per rectum, melena, or hematemesis Neurologic:  No visual changes, wkns, changes in mental status. All other systems reviewed and are otherwise negative except as noted above.  Physical Exam  Blood pressure 169/66, pulse 67, temperature 98.3 F (36.8 C), temperature source Oral, resp. rate 18, height 5' 2.5" (1.588 m), weight 174 lb 9.7 oz (79.2 kg), SpO2 100.00%.  General: Pleasant, NAD Psych: Normal affect. Neuro: Alert and oriented X 3. Moves all extremities spontaneously. HEENT: Normal  Neck: Supple without bruits or JVD. Lungs:  Resp regular and unlabored, CTA. Heart: RRR no s3, s4, or murmurs. Abdomen: Soft, non-tender, non-distended, BS + x 4.  Extremities: No clubbing, cyanosis or edema. DP/PT/Radials 2+ and equal bilaterally.  Labs  Recent Labs  12/26/12 1000 12/26/12 1653  TROPONINI <0.30 <0.30   Lab Results  Component Value Date   WBC 7.7 12/25/2012   HGB 9.3* 12/25/2012   HCT 28.6* 12/25/2012   MCV 94.4 12/25/2012   PLT 260 12/25/2012    Recent Labs Lab 12/23/12 1750  12/27/12 0501  NA  --   < > 137  K  --   < > 3.7  CL  --   < > 100  CO2  --   < > 29  BUN  --   < > 9  CREATININE  --   < > 0.51  CALCIUM  --   < > 9.2  PROT 5.5*  --   --   BILITOT 0.6  --   --   ALKPHOS 560*  --   --   ALT 35  --   --   AST 33  --   --   GLUCOSE  --   < > 102*  < > = values in this interval not displayed.  TTE 12/25/2012 Study Conclusions - Left ventricle: The cavity size was normal. Systolic function was normal. The estimated ejection fraction was in the range of 55% to 60%. Wall motion was normal; there were no regional wall motion abnormalities. Features are consistent with a pseudonormal left ventricular filling pattern, with concomitant abnormal relaxation and increased filling pressure  (grade 2 diastolic dysfunction). - Aortic valve: Valve area: 2.08cm^2(VTI). Valve area: 1.93cm^2 (Vmax). - Mitral valve: Mild regurgitation. - Tricuspid valve: Moderate regurgitation.  Radiology/Studies CHEST 2 VIEW    Cardiomegaly with probable early interstitial edema and trace  pleural effusions. Followup after diuresis may be helpful if there  is continued clinical concern for pneumonia which could be masked by  edema.   ECG  SR, 78/min, normal ECG    ASSESSMENT AND PLAN  79 year old female post right knee arthroplasty treated for cellulitis and sepsis   1. CHF - acute diastolic - due to iv fluid overload (during sepsis with borderline BP), now euvolemic after diuretic use. We recommend to switch to PO Lasix and see how she tolerates it. Lasix 40 mg PO PRN for home, the patient and her daughter were advised about proper use.  2. H/O SVT - on Bystolic started by Dr Graciela Husbands due to several medications intolerance (allergic reaction, insomnia), we recommend to switch back to Bystolic.   Signed, Lars Masson, MD 12/27/2012, 3:05 PM

## 2012-12-27 NOTE — Progress Notes (Signed)
Physical Therapy Treatment Patient Details Name: [name] [name] MRN: [id number] DOB: 04-07-1924 Today's Date: 12/27/2012 Time: 5621-3086 PT Time Calculation (min): 40 min  PT Assessment / Plan / Recommendation  History of Present Illness 79 y.o. female from the [facility] for Rehab therapy after a right Total Knee Arthoplasty on 09/22 who was brought to the ED due to increased pain redness and swelling of her right knee x 2 days.  She reports feeling heat from the area and has  Had increased stiffness.   She was evaluated in the ED, and underwent an Ultrasound to evaluate for a possible DVT and the study was negative for a DVT.  She was placed on IV antibiotics for Cellulitis, and Orthopedics was consulted and Dr. Ranell Patrick saw her in the ED.   She was felt to have an pneumonia and a UTI and was also placed on further antibiotic coverage to cover as well.  She was referred for admission.      PT Comments   Pt tolerated ambulation much better today. No dyspnea noted. sats 100% RA after return from walk. Pt plans DC to home  And not return to SNF. Pt achieved about 110 degrees flexion of R knee.  Plan practice steps in AM.  Follow Up Recommendations  Home health PT     Does the patient have the potential to tolerate intense rehabilitation     Barriers to Discharge        Equipment Recommendations       Recommendations for Other Services    Frequency Min 4X/week   Progress towards PT Goals Progress towards PT goals: Progressing toward goals  Plan Current plan remains appropriate    Precautions / Restrictions Precautions Precautions: Knee;Fall   Pertinent Vitals/Pain States mid knee is sore with sit/stand. Ice applied.    Mobility  Bed Mobility Supine to Sit: 7: Independent Sit to Supine: 7: Independent Transfers Sit to Stand: 5: Supervision;From bed Stand to Sit: 5: Supervision;To chair/3-in-1;To bed Details for Transfer Assistance: pt able to take a few steps without  RW Ambulation/Gait Ambulation/Gait Assistance: 5: Supervision Ambulation Distance (Feet): 400 Feet Assistive device: Rolling walker Ambulation/Gait Assistance Details: encouraged pt to ambulate slower to get improved swing. Gait Pattern: Step-through pattern    Exercises Total Joint Exercises Ankle Circles/Pumps: AROM;Both;10 reps;Supine Quad Sets: AROM;10 reps;Supine;Right Heel Slides: 10 reps;Supine;AROM;Both;Other (comment) Straight Leg Raises: AROM;Right;10 reps;Supine Long Arc Quad: AROM;Right;10 reps;Seated Knee Flexion: AROM;Right;10 reps;Seated Goniometric ROM: 10-110 r knee   PT Diagnosis:    PT Problem List:   PT Treatment Interventions:     PT Goals (current goals can now be found in the care plan section)    Visit Information  Last PT Received On: 12/27/12 History of Present Illness: 79 y.o. female from the [facility] for Rehab therapy after a right Total Knee Arthoplasty on 09/22 who was brought to the ED due to increased pain redness and swelling of her right knee x 2 days.  She reports feeling heat from the area and has  Had increased stiffness.   She was evaluated in the ED, and underwent an Ultrasound to evaluate for a possible DVT and the study was negative for a DVT.  She was placed on IV antibiotics for Cellulitis, and Orthopedics was consulted and Dr. Ranell Patrick saw her in the ED.   She was felt to have an pneumonia and a UTI and was also placed on further antibiotic coverage to cover as well.  She was referred for admission.  Subjective Data      Cognition  Cognition Arousal/Alertness: Awake/alert    Balance     End of Session PT - End of Session Activity Tolerance: Patient tolerated treatment well Patient left: with call bell/phone within reach;with family/visitor present;in bed   GP     [name] 12/27/2012, 4:36 PM Blanchard Kelch PT [phone]

## 2012-12-28 ENCOUNTER — Inpatient Hospital Stay (HOSPITAL_COMMUNITY): Payer: Medicare Other

## 2012-12-28 DIAGNOSIS — D649 Anemia, unspecified: Secondary | ICD-10-CM | POA: Diagnosis present

## 2012-12-28 LAB — COMPREHENSIVE METABOLIC PANEL
ALT: 26 U/L (ref 0–35)
AST: 29 U/L (ref 0–37)
BUN: 12 mg/dL (ref 6–23)
CO2: 33 mEq/L — ABNORMAL HIGH (ref 19–32)
Calcium: 9.4 mg/dL (ref 8.4–10.5)
Creatinine, Ser: 0.57 mg/dL (ref 0.50–1.10)
GFR calc Af Amer: >90 mL/min (ref >90)
GFR calc non Af Amer: 82 mL/min — ABNORMAL LOW (ref >90)
Glucose, Bld: 121 mg/dL — ABNORMAL HIGH (ref 70–99)
Potassium: 3.8 mEq/L (ref 3.5–5.1)
Total Bilirubin: 0.3 mg/dL (ref 0.3–1.2)

## 2012-12-28 LAB — CBC WITH DIFFERENTIAL/PLATELET
Basophils Absolute: 0 10*3/uL (ref 0.0–0.1)
Basophils Relative: 1 % (ref 0–1)
Eosinophils Relative: 3 % (ref 0–5)
HCT: 39 % (ref 36.0–46.0)
Hemoglobin: 12.3 g/dL (ref 12.0–15.0)
Lymphocytes Relative: 20 % (ref 12–46)
MCHC: 31.5 g/dL (ref 30.0–36.0)
MCV: 94.9 fL (ref 78.0–100.0)
Monocytes Absolute: 0.6 10*3/uL (ref 0.1–1.0)
Monocytes Relative: 9 % (ref 3–12)
Neutrophils Relative %: 67 % (ref 43–77)
RDW: 14.4 % (ref 11.5–15.5)

## 2012-12-28 LAB — MAGNESIUM: Magnesium: 2 mg/dL (ref 1.5–2.5)

## 2012-12-28 MED ORDER — NEBIVOLOL HCL 10 MG PO TABS
10.0000 mg | ORAL_TABLET | Freq: Every day | ORAL | Status: DC
  Administered 2012-12-28 – 2012-12-29 (×2): 10 mg via ORAL
  Filled 2012-12-28 (×2): qty 1

## 2012-12-28 MED ORDER — FUROSEMIDE 40 MG PO TABS
40.0000 mg | ORAL_TABLET | Freq: Every day | ORAL | Status: DC
  Administered 2012-12-28 – 2012-12-29 (×2): 40 mg via ORAL
  Filled 2012-12-28 (×3): qty 1

## 2012-12-28 NOTE — Progress Notes (Signed)
[facility] PROGRESS NOTE  [name] [name] ZHY:[id number] DOB: [birth year] DOA: 12/23/2012 PCP: Hollice Espy, MD  Assessment/Plan: 1. Cellulitis right lower extremity  -Continue vancomycin + ceftriaxone -Orthopedic consult; Dr. Ollen Gross states No signs of cellulitis or septic arthritis. Does not need antibiotics with respect to knee. Has follow up in office later this week  -erythema has improved; marked area so that we have a reference point for the a.m.  -Blood cultures--neg to date  -Obtain ultrasound of right knee to ensure negative abscess -Continue IV antibiotics until ultrasound complete. Transition to Bactrim if negative for abscess/necrotizing fasciitis  2. Acute on chronic diastolic CHF  -Increase IV furosemide to BID  -Dyspnea worsened after IV fluids earlier in the admission  -High weight for this admission = 83.1 kg, current weight =79.28kg  -Accurate I.'s and O.'s  -Patient has gained 6.4 kg since 12/05/2012  -Echocardiogram see results below  -Continue Lasix 40 mg daily -Continue Bystolic 10 mg daily  3. Pyuria  -DC ABx for presumed UTI (antibiotic course complete)   4. Anemia  -B12 and folate--normal range  -iron saturation 10% -Give dose of Nulecit IV and start ferrous sulfate   5. Diaphoresis -Possibly secondary to the Paxil we'll discuss with family in a.m. about DC medication   Code Status: Full Family Communication:  Disposition Plan:    Consultants:  Orthopedics  Dr. Homero Fellers Aluisio  Procedures:  Echocardiogram 01/01/2013 - Left ventricle: The cavity size was normal. Systolic function was normal.  -LVEF= 55% to 60%. Wall motion was normal; there were no regional wall motion abnormalities.  -(grade 2 diastolic dysfunction). - Aortic valve: Valve area: 2.08cm^2(VTI). Valve area: 1.93cm^2 (Vmax). - Mitral valve: Mild regurgitation. - Tricuspid valve: Moderate regurgitation.    Antibiotics: Vancomycin 12/24/2012>>>  Ceftriaxone  12/24/2012>>>   HPI/Subjective: [name] [name] is a 79 y.o. WF PMHx OA of knee, Hx SVT, sleep disturbance, chronic diastolic CHF. Pt from the [facility] for Rehab therapy after a right Total Knee Arthoplasty on 09/22 who was brought to the ED due to increased pain redness and swelling of her right knee x 2 days. She reports feeling heat from the area and has Had increased stiffness. She was evaluated in the ED, and underwent an Ultrasound to evaluate for a possible DVT and the study was negative for a DVT. She was placed on IV antibiotics for Cellulitis, and Orthopedics was consulted and Dr. Ranell Patrick saw her in the ED. She was felt to have an pneumonia and a UTI and was also placed on further antibiotic coverage to cover as well. She was referred for admission. TODAY states continued pain at the knee joint and over the erythematous area just distal to the knee. Patient and daughter state the total area of erythema has decreased (started at mid thigh and went down to mid shin). States pain posterior to the knee resolved.  Complains of waxing and waning episodes of feeling hot/freezing cold   Objective: Filed Vitals:   12/27/12 1607 12/27/12 2117 12/28/12 0450 12/28/12 0500  BP: 152/60 137/73 159/63   Pulse: 63 70 69   Temp: 98.1 F (36.7 C) 98.6 F (37 C) 98.6 F (37 C)   TempSrc: Oral Oral Oral   Resp: 18 18 18    Height:      Weight:    79.289 kg (174 lb 12.8 oz)  SpO2: 100% 93% 96%     Intake/Output Summary (Last 24 hours) at 12/28/12 0805 Last data filed at 12/27/12 2117  Gross per  24 hour  Intake    920 ml  Output   4050 ml  Net  -3130 ml   Filed Weights   12/26/12 1144 12/27/12 0655 12/28/12 0500  Weight: 79.289 kg (174 lb 12.8 oz) 79.2 kg (174 lb 9.7 oz) 79.289 kg (174 lb 12.8 oz)    Exam:   General: A./O. X4, NAD unless right knee palpated  Cardiovascular: Regular rate, negative murmurs rubs or gallops, DP/PT pulse one plus bilateral  Respiratory: Clear to auscultation  bilateral  Abdomen: Soft, nontender, nondistended plus bowel sounds  Musculoskeletal: Right knee vertical incision consistent with right TKA, negative sign of surgical infection, area of erythema just distal to the knee joint tender to palpation at right knee joint as well as or area of erythema.   Data Reviewed: Basic Metabolic Panel:  Recent Labs Lab 12/24/12 0515 12/25/12 0500 12/26/12 1000 12/27/12 0501  NA 137 137 137 137  K 4.2 3.3* 3.2* 3.7  CL 104 100 98 100  CO2 28 30 31 29   GLUCOSE 113* 110* 132* 102*  BUN 13 9 8 9   CREATININE 0.57 0.54 0.49* 0.51  CALCIUM 8.2* 8.5 9.0 9.2  MG  --  2.0  --   --    Liver Function Tests:  Recent Labs Lab 12/23/12 1750  AST 33  ALT 35  ALKPHOS 560*  BILITOT 0.6  PROT 5.5*  ALBUMIN 2.7*   No results found for this basename: LIPASE, AMYLASE,  in the last 168 hours No results found for this basename: AMMONIA,  in the last 168 hours CBC:  Recent Labs Lab 12/23/12 1750 12/24/12 0515 12/25/12 0500  WBC 8.1 8.1 7.7  NEUTROABS 6.2  --   --   HGB 10.6* 9.3* 9.3*  HCT 32.9* 29.3* 28.6*  MCV 94.0 95.4 94.4  PLT 310 262 260   Cardiac Enzymes:  Recent Labs Lab 12/26/12 1000 12/26/12 1653  TROPONINI <0.30 <0.30   BNP (last 3 results) No results found for this basename: PROBNP,  in the last 8760 hours CBG: No results found for this basename: GLUCAP,  in the last 168 hours  Recent Results (from the past 240 hour(s))  CULTURE, BLOOD (ROUTINE X 2)     Status: None   Collection Time    12/23/12  5:50 PM      Result Value Range Status   Specimen Description BLOOD LEFT ARM   Final   Special Requests BOTTLES DRAWN AEROBIC AND ANAEROBIC   Final   Culture  Setup Time     Final   Value: 12/23/2012 22:00     Performed at [facility]   Culture     Final   Value:        BLOOD CULTURE RECEIVED NO GROWTH TO DATE CULTURE WILL BE HELD FOR 5 DAYS BEFORE ISSUING A FINAL NEGATIVE REPORT     Performed at [facility]   Report Status PENDING   Incomplete  CULTURE, BLOOD (ROUTINE X 2)     Status: None   Collection Time    12/23/12  6:55 PM      Result Value Range Status   Specimen Description BLOOD RIGHT ARM   Final   Special Requests BOTTLES DRAWN AEROBIC AND ANAEROBIC   Final   Culture  Setup Time     Final   Value: 12/23/2012 21:51     Performed at [facility]   Culture     Final   Value:  BLOOD CULTURE RECEIVED NO GROWTH TO DATE CULTURE WILL BE HELD FOR 5 DAYS BEFORE ISSUING A FINAL NEGATIVE REPORT     Performed at [facility]   Report Status PENDING   Incomplete  URINE CULTURE     Status: None   Collection Time    12/23/12  7:30 PM      Result Value Range Status   Specimen Description URINE, CLEAN CATCH   Final   Special Requests NONE   Final   Culture  Setup Time     Final   Value: 12/24/2012 02:38     Performed at [facility] Count     Final   Value: 60,000 COLONIES/ML     Performed at [facility]   Culture     Final   Value: Multiple bacterial morphotypes present, none predominant. Suggest appropriate recollection if clinically indicated.     Performed at [facility]   Report Status 12/25/2012 FINAL   Final  MRSA PCR SCREENING     Status: None   Collection Time    12/24/12  2:34 AM      Result Value Range Status   MRSA by PCR NEGATIVE  NEGATIVE Final   Comment:            The GeneXpert MRSA Assay (FDA     approved for NASAL specimens     only), is one component of a     comprehensive MRSA colonization     surveillance program. It is not     intended to diagnose MRSA     infection nor to guide or     monitor treatment for     MRSA infections.     Studies: No results found.  Scheduled Meds: . cefTRIAXone (ROCEPHIN)  IV  1 g Intravenous Q24H  . docusate sodium  100 mg Oral BID  . ferrous sulfate  325 mg Oral BID WC  . furosemide  60 mg Intravenous BID  . nebivolol  5 mg Oral Daily  . PARoxetine  10  mg Oral QODAY  . potassium chloride  20 mEq Oral BID  . prednisoLONE acetate  1 drop Both Eyes BID  . rivaroxaban  10 mg Oral Q breakfast  . senna  1 tablet Oral Daily  . vancomycin  1,000 mg Intravenous Q12H   Continuous Infusions:   Principal Problem:   Sepsis Active Problems:   OA (osteoarthritis) of knee   Cellulitis of leg, right   Pyuria   Hypoxemia   Acute on chronic diastolic heart failure    Time spent: 45 min    WOODS, CURTIS, J  [facility] Pager [phone]. If 7PM-7AM, please contact night-coverage at [email or web address], password [facility] 12/28/2012, 8:05 AM  LOS: 5 days

## 2012-12-28 NOTE — Progress Notes (Signed)
PT Cancellation Note  Patient Details Name: [name] [name] MRN: [id number] DOB: [birth year]   Cancelled Treatment:    Reason Eval/Treat Not Completed: Medical issues which prohibited therapy (doppler for abcess eval.)   [name] 12/28/2012, 3:11 PM Blanchard Kelch PT [phone]

## 2012-12-28 NOTE — Progress Notes (Signed)
Patient Name: [name] [name] Date of Encounter: 12/28/2012   Principal Problem:   Sepsis Active Problems:   OA (osteoarthritis) of knee   Cellulitis of leg, right   Pyuria   Hypoxemia   Acute on chronic diastolic heart failure    SUBJECTIVE The patient denies SOB.  CURRENT MEDS . cefTRIAXone (ROCEPHIN)  IV  1 g Intravenous Q24H  . docusate sodium  100 mg Oral BID  . ferrous sulfate  325 mg Oral BID WC  . furosemide  60 mg Intravenous BID  . nebivolol  5 mg Oral Daily  . PARoxetine  10 mg Oral QODAY  . potassium chloride  20 mEq Oral BID  . prednisoLONE acetate  1 drop Both Eyes BID  . rivaroxaban  10 mg Oral Q breakfast  . senna  1 tablet Oral Daily  . vancomycin  1,000 mg Intravenous Q12H    OBJECTIVE  Filed Vitals:   12/27/12 1607 12/27/12 2117 12/28/12 0450 12/28/12 0500  BP: 152/60 137/73 159/63   Pulse: 63 70 69   Temp: 98.1 F (36.7 C) 98.6 F (37 C) 98.6 F (37 C)   TempSrc: Oral Oral Oral   Resp: 18 18 18    Height:      Weight:    174 lb 12.8 oz (79.289 kg)  SpO2: 100% 93% 96%     Intake/Output Summary (Last 24 hours) at 12/28/12 0849 Last data filed at 12/27/12 2117  Gross per 24 hour  Intake    920 ml  Output   4050 ml  Net  -3130 ml   Filed Weights   12/26/12 1144 12/27/12 0655 12/28/12 0500  Weight: 174 lb 12.8 oz (79.289 kg) 174 lb 9.7 oz (79.2 kg) 174 lb 12.8 oz (79.289 kg)    PHYSICAL EXAM  General: Pleasant, NAD. Neuro: Alert and oriented X 3. Moves all extremities spontaneously. Psych: Normal affect. HEENT:  Normal  Neck: Supple without bruits or JVD. Lungs:  Resp regular and unlabored, CTA Heart: RRR no s3, s4, or murmurs. Abdomen: Soft, non-tender, non-distended, BS + x 4.  Extremities: No clubbing, no edema, DP/PT/Radials 2+ and equal bilaterally.  Accessory Clinical Findings  CBC No results found for this basename: WBC, NEUTROABS, HGB, HCT, MCV, PLT,  in the last 72 hours Basic Metabolic Panel  Recent Labs   57/84/69 1000 12/27/12 0501  NA 137 137  K 3.2* 3.7  CL 98 100  CO2 31 29  GLUCOSE 132* 102*  BUN 8 9  CREATININE 0.49* 0.51  CALCIUM 9.0 9.2   Cardiac Enzymes  Recent Labs  12/26/12 1000 12/26/12 1653  TROPONINI <0.30 <0.30   TELE SR, HR 70-80  Radiology/Studies Dg Chest 2 View 12/23/2012   IMPRESSION: Cardiomegaly with probable early interstitial edema and trace pleural effusions. Followup after diuresis may be helpful if there is continued clinical concern for pneumonia which could be masked by edema.      ASSESSMENT AND PLAN  79 year old female post right knee arthroplasty treated for cellulitis and sepsis  1. CHF - acute diastolic - due to iv fluid overload (during sepsis with borderline BP), now euvolemic after diuretic use. We recommend to switch to PO Lasix and see how she tolerates it. Lasix 40 mg PO PRN for home, the patient and her daughter were advised about proper use.   2. H/O SVT - on Bystolic started by Dr Graciela Husbands due to several medications intolerance (allergic reaction, insomnia), we recommend to switch back to Bystolic.  3. Hypertension - increase Bystolic to 10 mg daily  4. Hypokalemia - resolved  Signed, Lars Masson MD

## 2012-12-29 DIAGNOSIS — R61 Generalized hyperhidrosis: Secondary | ICD-10-CM | POA: Diagnosis present

## 2012-12-29 LAB — COMPREHENSIVE METABOLIC PANEL
ALT: 23 U/L (ref 0–35)
AST: 23 U/L (ref 0–37)
Alkaline Phosphatase: 443 U/L — ABNORMAL HIGH (ref 39–117)
BUN: 11 mg/dL (ref 6–23)
CO2: 29 mEq/L (ref 19–32)
Chloride: 98 mEq/L (ref 96–112)
Creatinine, Ser: 0.53 mg/dL (ref 0.50–1.10)
GFR calc Af Amer: >90 mL/min (ref >90)
GFR calc non Af Amer: 84 mL/min — ABNORMAL LOW (ref >90)
Glucose, Bld: 126 mg/dL — ABNORMAL HIGH (ref 70–99)
Potassium: 4 mEq/L (ref 3.5–5.1)
Sodium: 136 mEq/L (ref 135–145)
Total Bilirubin: 0.3 mg/dL (ref 0.3–1.2)

## 2012-12-29 LAB — CBC WITH DIFFERENTIAL/PLATELET
Basophils Absolute: 0 10*3/uL (ref 0.0–0.1)
Eosinophils Absolute: 0.3 10*3/uL (ref 0.0–0.7)
Lymphocytes Relative: 25 % (ref 12–46)
Lymphs Abs: 1.5 10*3/uL (ref 0.7–4.0)
MCHC: 32.4 g/dL (ref 30.0–36.0)
MCV: 93.6 fL (ref 78.0–100.0)
Neutro Abs: 3.5 10*3/uL (ref 1.7–7.7)
Neutrophils Relative %: 59 % (ref 43–77)
Platelets: 350 10*3/uL (ref 150–400)
RBC: 3.92 MIL/uL (ref 3.87–5.11)
RDW: 14.4 % (ref 11.5–15.5)
WBC: 5.9 10*3/uL (ref 4.0–10.5)

## 2012-12-29 LAB — CULTURE, BLOOD (ROUTINE X 2)
Culture: NO GROWTH
Culture: NO GROWTH

## 2012-12-29 LAB — MAGNESIUM: Magnesium: 2.1 mg/dL (ref 1.5–2.5)

## 2012-12-29 MED ORDER — CEPHALEXIN 500 MG PO CAPS: 500.0000 mg | ORAL_CAPSULE | Freq: Two times a day (BID) | ORAL | Status: DC

## 2012-12-29 MED ORDER — FERROUS SULFATE 325 (65 FE) MG PO TABS: 325.0000 mg | ORAL_TABLET | Freq: Two times a day (BID) | ORAL | Status: DC

## 2012-12-29 MED ORDER — RIVAROXABAN 10 MG PO TABS: 10.0000 mg | ORAL_TABLET | Freq: Every day | ORAL | Status: DC

## 2012-12-29 MED ORDER — FUROSEMIDE 40 MG PO TABS: 40.0000 mg | ORAL_TABLET | Freq: Every day | ORAL | Status: DC

## 2012-12-29 MED ORDER — POTASSIUM CHLORIDE CRYS ER 20 MEQ PO TBCR: 20.0000 meq | EXTENDED_RELEASE_TABLET | Freq: Every day | ORAL | Status: DC

## 2012-12-29 MED ORDER — DOXYCYCLINE HYCLATE 50 MG PO CAPS: 100.0000 mg | ORAL_CAPSULE | Freq: Two times a day (BID) | ORAL | Status: DC

## 2012-12-29 MED ORDER — NEBIVOLOL HCL 5 MG PO TABS: 10.0000 mg | ORAL_TABLET | Freq: Every day | ORAL | Status: DC

## 2012-12-29 MED ORDER — VITAMIN C 500 MG PO TABS
500.0000 mg | ORAL_TABLET | Freq: Two times a day (BID) | ORAL | Status: DC
  Filled 2012-12-29 (×2): qty 1

## 2012-12-29 MED ORDER — METHOCARBAMOL 500 MG PO TABS: 500.0000 mg | ORAL_TABLET | Freq: Four times a day (QID) | ORAL | Status: DC | PRN

## 2012-12-29 MED ORDER — ASCORBIC ACID 500 MG PO TABS: 500.0000 mg | ORAL_TABLET | Freq: Two times a day (BID) | ORAL | Status: DC

## 2012-12-29 MED ORDER — HYDROMORPHONE HCL 2 MG PO TABS: 2.0000 mg | ORAL_TABLET | ORAL | Status: DC | PRN

## 2012-12-29 NOTE — Progress Notes (Signed)
Pt's IV infiltrated and leaking.  Site red/puffy and painful.  This is the second IV to infiltrate on this shift, and pt states this is the 6th IV to infiltrate in the past few days, as evidenced by multiple areas of redness and induration to bilateral upper extremities.  Each IV access lasting only for a single dose of IV Vancomycin.  Pt states she does not wish to have any more IVs put in until discussing with the doctor.  She hopes to be switched to PO abx in the AM.  IV removed, warm compress provided.  Will continue to monitor.  Ardyth Gal, RN 12/29/2012

## 2012-12-29 NOTE — Discharge Summary (Signed)
Physician Discharge Summary  [name] [name] AVW:[id number] DOB: [birth year] DOA: 12/23/2012  PCP: Hollice Espy, MD  Admit date: 12/23/2012 Discharge date: 12/29/2012  Time spent: 35 minutes  Recommendations for Outpatient Follow-up:  1. Cellulitis right lower extremity  -Continue vancomycin + ceftriaxone  -Orthopedic consult; Dr. Ollen Gross states No signs of cellulitis or septic arthritis. Does not need antibiotics with respect to knee. Has follow up in office later this week  -erythema continues to improve; marked area so that we have a reference point for the a.m.  -Blood cultures--neg to date  -Ultrasound of right knee; negative for abscess or necrotizing fasciitis  -Will DC IV antibiotics transition    2. Acute on chronic diastolic CHF  -Increase IV furosemide to BID  -Dyspnea worsened after IV fluids earlier in the admission  -High weight for this admission = 83.1 kg, current weight =78.9kg  --Echocardiogram see results below  -Continue Lasix 40 mg daily  -Continue Bystolic 10 mg daily   3. Pyuria  -DC ABx for presumed UTI (antibiotic course complete)   4. Anemia  -B12 and folate--normal range  -iron saturation 10%  -Give dose of Nulecit IV and start ferrous sulfate + Vitamin C  5. Diaphoresis  -Pt not c/o SSx today     Discharge Diagnoses:  Principal Problem:   Sepsis Active Problems:   OA (osteoarthritis) of knee   Cellulitis of leg, right   Pyuria   Hypoxemia   Acute on chronic diastolic heart failure   Anemia   Discharge Condition: Stable  Diet recommendation: Heart healthy  Filed Weights   12/27/12 0655 12/28/12 0500 12/29/12 0520  Weight: 79.2 kg (174 lb 9.7 oz) 79.289 kg (174 lb 12.8 oz) 78.971 kg (174 lb 1.6 oz)    History of present illness:  [name] [name] is a 79 y.o. WF PMHx OA of knee, Hx SVT, sleep disturbance, chronic diastolic CHF. Pt from the [facility] for Rehab therapy after a right Total Knee Arthoplasty on 09/22 who was brought  to the ED due to increased pain redness and swelling of her right knee x 2 days. She reports feeling heat from the area and has Had increased stiffness. She was evaluated in the ED, and underwent an Ultrasound to evaluate for a possible DVT and the study was negative for a DVT. She was placed on IV antibiotics for Cellulitis, and Orthopedics was consulted and Dr. Ranell Patrick saw her in the ED. She was felt to have an pneumonia and a UTI and was also placed on further antibiotic coverage to cover as well. She was referred for admission. 12/28/2012 states continued pain at the knee joint and over the erythematous area just distal to the knee. Patient and daughter state the total area of erythema has decreased (started at mid thigh and went down to mid shin). States pain posterior to the knee resolved. Complains of waxing and waning episodes of feeling hot/freezing cold TODAY states feeling somewhat rundown, no complaints of diaphoresis overnight, impatient with the healing process of her right TKA. Counseled patient and daughter on the findings of the ultrasound of her right knee which showed no necrotizing fasciitis/abscess. Patient ready for discharge with home health, RN. Will finish a 14 day course of antibiotics  Consultants:  Orthopedics Dr. Homero Fellers Aluisio   Procedures:  Echocardiogram 01/01/2013 - Left ventricle: The cavity size was normal. Systolic function was normal.  -LVEF= 55% to 60%. Wall motion was normal; there were no regional wall motion abnormalities.  -(grade 2 diastolic  dysfunction). - Aortic valve: Valve area: 2.08cm^2(VTI). Valve area: 1.93cm^2 (Vmax). - Mitral valve: Mild regurgitation. - Tricuspid valve: Moderate regurgitation.  Ultrasound right lower extremity 12/29/2012 Subcutaneous edema without organized or drainable fluid collection  in the area of clinical concern.   Antibiotics:  Vancomycin 12/24/2012>>> 10/9 Ceftriaxone 12/24/2012>>>10/9 Doxycycline 100 mg BID 10/9>>  stop date 10/17 Keflex 500 mg BID 10/9>> stop date 10/17  Discharge Exam: Filed Vitals:   12/28/12 1327 12/28/12 1708 12/28/12 2221 12/29/12 0520  BP: 133/61 145/56 126/53 140/59  Pulse: 65 66 69 67  Temp: 98.2 F (36.8 C) 98.6 F (37 C) 98.4 F (36.9 C) 98.4 F (36.9 C)  TempSrc: Oral Oral Oral Oral  Resp: 16 18 18 18   Height:      Weight:    78.971 kg (174 lb 1.6 oz)  SpO2: 90% 98% 96% 92%   General: A./O. X4, NAD unless right knee palpated  Cardiovascular: Regular rate, negative murmurs rubs or gallops, DP/PT pulse one plus bilateral  Respiratory: Clear to auscultation bilateral  Abdomen: Soft, nontender, nondistended plus bowel sounds  Musculoskeletal: Right knee vertical incision consistent with right TKA, negative sign of surgical infection, area of erythema just distal to the knee joint tender to palpation at right knee joint as well as or area of erythema. NOTE area of erythema decreasing.  Discharge Instructions     Medication List    ASK your doctor about these medications       bisacodyl 10 MG suppository  Commonly known as:  DULCOLAX  Place 1 suppository (10 mg total) rectally daily as needed.     DSS 100 MG Caps  Take 100 mg by mouth 2 (two) times daily.     HYDROmorphone 2 MG tablet  Commonly known as:  DILAUDID  Take 1-2 tablets (2-4 mg total) by mouth every 4 (four) hours as needed.     methocarbamol 500 MG tablet  Commonly known as:  ROBAXIN  Take 1 tablet (500 mg total) by mouth every 6 (six) hours as needed.     metoCLOPramide 5 MG tablet  Commonly known as:  REGLAN  Take 1-2 tablets (5-10 mg total) by mouth every 8 (eight) hours as needed (if ondansetron (ZOFRAN) ineffective.).     nebivolol 5 MG tablet  Commonly known as:  BYSTOLIC  Take 5 mg by mouth every other day. At dinner time. Alternating with paxil     ondansetron 4 MG tablet  Commonly known as:  ZOFRAN  Take 1 tablet (4 mg total) by mouth every 6 (six) hours as needed for nausea.      PARoxetine 10 MG tablet  Commonly known as:  PAXIL  Take 10 mg by mouth every other day. Alternating with bystolic     polyethylene glycol packet  Commonly known as:  MIRALAX / GLYCOLAX  Take 17 g by mouth daily as needed.     prednisoLONE acetate 1 % ophthalmic suspension  Commonly known as:  PRED FORTE  Place 1 drop into both eyes 2 (two) times daily.     rivaroxaban 10 MG Tabs tablet  Commonly known as:  XARELTO  - Take 1 tablet (10 mg total) by mouth daily with breakfast. Take Xarelto for two and a half more weeks, then discontinue Xarelto.  - Once the patient has completed the Xarelto, they may resume the 81 mg Aspirin.       Allergies  Allergen Reactions  . Ultram [Tramadol] Hives  . Buprenex [Buprenorphine Hcl] Other (See  Comments)    Heart raced  . Levofloxacin Other (See Comments)    levaquin unknown reaction  . Percocet [Oxycodone-Acetaminophen] Itching    "felt drained"  . Sulfa Antibiotics Other (See Comments)    unknown  . Tape     Adhesive tape causes skin to pull off  . Zetia [Ezetimibe] Other (See Comments)    unknown      The results of significant diagnostics from this hospitalization (including imaging, microbiology, ancillary and laboratory) are listed below for reference.    Significant Diagnostic Studies: Dg Chest 1 View  12/15/2012   CLINICAL DATA:  History of sharp chest pains. Shortness of breath. Cough. Congestion. Postoperative.  EXAM: CHEST - 1 VIEW  COMPARISON:  12/05/2012.  FINDINGS: This cardiac silhouette is upper normal size. Ectasia and nonaneurysmal calcification of the thoracic aorta are seen. Mediastinal and hilar contours appear stable. No pulmonary infiltrates or masses are evident. No pleural abnormality is evident. No pneumothorax is evident. Osteophytes are present in the spine. There are cholecystectomy clips. .  IMPRESSION: No acute or active cardiopulmonary abnormalities are identified. Stable chronic findings are detailed  above. .   Electronically Signed   By: Onalee Hua  Call M.D.   On: 12/15/2012 10:40   Dg Chest 2 View  12/23/2012   CLINICAL DATA:  Fever and shortness of breath after knee replacement  EXAM: CHEST  2 VIEW  COMPARISON:  12/15/2012  FINDINGS: Cardiomegaly noted with a few interstitial Kerley B-lines peripherally and central vascular congestion. Lung volumes are suboptimal with crowding of the bronchovascular markings. Trace pleural fluid is present. No pneumothorax. No acute osseous abnormality.  IMPRESSION: Cardiomegaly with probable early interstitial edema and trace pleural effusions. Followup after diuresis may be helpful if there is continued clinical concern for pneumonia which could be masked by edema.   Electronically Signed   By: Christiana Pellant M.D.   On: 12/23/2012 20:28   Dg Chest 2 View  12/05/2012   CLINICAL DATA:  Preop total knee replacement.  EXAM: CHEST  2 VIEW  COMPARISON:  07/08/2012  FINDINGS: Heart and mediastinal contours are within normal limits. No focal opacities or effusions. No acute bony abnormality. Degenerative changes in the thoracic spine.  IMPRESSION: No active cardiopulmonary disease.   Electronically Signed   By: Charlett Nose M.D.   On: 12/05/2012 15:33   [place] Extrem Low Right Ltd  12/29/2012   CLINICAL DATA:  79 year old female with continued pain around AE right total knee arthroplasty. Cellulitis.  EXAM: RIGHT LOWER EXTREMITY SOFT TISSUE ULTRASOUND LIMITED  TECHNIQUE: Ultrasound examination was performed including evaluation of the muscles, tendons, joint, and adjacent soft tissues.  COMPARISON:  Right knee radiographs 12/23/2012.  FINDINGS: The area of clinical concern corresponding to the anterior knee demonstrate subcutaneous edema. No organized or drainable fluid collection identified. No sonographic evidence of subcutaneous gas (and no subcutaneous gas identified on the comparison).  IMPRESSION: Subcutaneous edema without organized or drainable fluid collection in the  area of clinical concern.   Electronically Signed   By: Augusto Gamble M.D.   On: 12/29/2012 07:25   Dg Knee Complete 4 Views Right  12/23/2012   CLINICAL DATA:  Right knee pain. Status post right knee arthroplasty on 12/12/2012.  EXAM: RIGHT KNEE - COMPLETE 4+ VIEW  COMPARISON:  None.  FINDINGS: Alignment of a total right knee arthroplasty appears normal. No fracture, dislocation or abnormal lucency is identified. The lateral projection does show some soft tissue swelling anterior to the patella. No obvious  joint effusion is seen.  IMPRESSION: Normal appearance of right knee arthroplasty. Some soft tissue swelling is noted in the prepatellar region.   Electronically Signed   By: Irish Lack M.D.   On: 12/23/2012 18:42    Microbiology: Recent Results (from the past 240 hour(s))  CULTURE, BLOOD (ROUTINE X 2)     Status: None   Collection Time    12/23/12  5:50 PM      Result Value Range Status   Specimen Description BLOOD LEFT ARM   Final   Special Requests BOTTLES DRAWN AEROBIC AND ANAEROBIC   Final   Culture  Setup Time     Final   Value: 12/23/2012 22:00     Performed at [facility]   Culture     Final   Value: NO GROWTH 5 DAYS     Performed at [facility]   Report Status 12/29/2012 FINAL   Final  CULTURE, BLOOD (ROUTINE X 2)     Status: None   Collection Time    12/23/12  6:55 PM      Result Value Range Status   Specimen Description BLOOD RIGHT ARM   Final   Special Requests BOTTLES DRAWN AEROBIC AND ANAEROBIC   Final   Culture  Setup Time     Final   Value: 12/23/2012 21:51     Performed at [facility]   Culture     Final   Value: NO GROWTH 5 DAYS     Performed at [facility]   Report Status 12/29/2012 FINAL   Final  URINE CULTURE     Status: None   Collection Time    12/23/12  7:30 PM      Result Value Range Status   Specimen Description URINE, CLEAN CATCH   Final   Special Requests NONE   Final   Culture  Setup Time     Final    Value: 12/24/2012 02:38     Performed at [facility] Count     Final   Value: 60,000 COLONIES/ML     Performed at [facility]   Culture     Final   Value: Multiple bacterial morphotypes present, none predominant. Suggest appropriate recollection if clinically indicated.     Performed at [facility]   Report Status 12/25/2012 FINAL   Final  MRSA PCR SCREENING     Status: None   Collection Time    12/24/12  2:34 AM      Result Value Range Status   MRSA by PCR NEGATIVE  NEGATIVE Final   Comment:            The GeneXpert MRSA Assay (FDA     approved for NASAL specimens     only), is one component of a     comprehensive MRSA colonization     surveillance program. It is not     intended to diagnose MRSA     infection nor to guide or     monitor treatment for     MRSA infections.     Labs: Basic Metabolic Panel:  Recent Labs Lab 12/24/12 0515 12/25/12 0500 12/26/12 1000 12/27/12 0501 12/28/12 1432 12/29/12 0435  NA 137 137 137 137 135 136  K 4.2 3.3* 3.2* 3.7 3.8 4.0  CL 104 100 98 100 94* 98  CO2 28 30 31 29  33* 29  GLUCOSE 113* 110* 132* 102* 121* 126*  BUN  13 9 8 9 12 11   CREATININE 0.57 0.54 0.49* 0.51 0.57 0.53  CALCIUM 8.2* 8.5 9.0 9.2 9.4 9.4  MG  --  2.0  --   --  2.0 2.1   Liver Function Tests:  Recent Labs Lab 12/23/12 1750 12/28/12 1432 12/29/12 0435  AST 33 29 23  ALT 35 26 23  ALKPHOS 560* 509* 443*  BILITOT 0.6 0.3 0.3  PROT 5.5* 6.7 6.1  ALBUMIN 2.7* 2.9* 2.7*   No results found for this basename: LIPASE, AMYLASE,  in the last 168 hours No results found for this basename: AMMONIA,  in the last 168 hours CBC:  Recent Labs Lab 12/23/12 1750 12/24/12 0515 12/25/12 0500 12/28/12 1432 12/29/12 0435  WBC 8.1 8.1 7.7 6.8 5.9  NEUTROABS 6.2  --   --  4.6 3.5  HGB 10.6* 9.3* 9.3* 12.3 11.9*  HCT 32.9* 29.3* 28.6* 39.0 36.7  MCV 94.0 95.4 94.4 94.9 93.6  PLT 310 262 260 416* 350   Cardiac  Enzymes:  Recent Labs Lab 12/26/12 1000 12/26/12 1653  TROPONINI <0.30 <0.30   BNP: BNP (last 3 results) No results found for this basename: PROBNP,  in the last 8760 hours CBG: No results found for this basename: GLUCAP,  in the last 168 hours     Signed:  Carolyne Littles, MD [facility] [phone] pager

## 2013-01-02 ENCOUNTER — Telehealth: Payer: Self-pay | Admitting: Internal Medicine

## 2013-01-02 NOTE — Telephone Encounter (Signed)
New message   Talk to nurse about medication

## 2013-01-04 NOTE — Telephone Encounter (Signed)
Follow up   Physical therapist ck bp today--88/58--c/o very tired--had bystolic this am--need advice

## 2013-01-04 NOTE — Telephone Encounter (Signed)
Patient's daughter states that patient visited PCP this afternoon - BP 91/41 sitting, 99/43 after moving around for a few minutes. Patient had recent hospital stay in which they increased her Bystolic. Advised to decrease back to 5mg  every other day as before. Also discussed ways to increase BP until medication effects wear off - increased fluids/caffeine, salt intake, leg elevation. She will call us back if further episodes occur or blood pressure continues to remain low and patient symptomatic. Patient's daughter verbalized understanding and agreeable to plan.

## 2013-01-05 NOTE — Telephone Encounter (Signed)
Called to check with patient to see if improvement after talking last night. Caretaker Dois Davenport) was extremely grateful and thankful, she states "mom is much better and feeling better today. What you told us to do worked".

## 2013-01-12 ENCOUNTER — Encounter: Payer: Medicare Other | Admitting: Physician Assistant

## 2013-01-12 ENCOUNTER — Encounter: Payer: Self-pay | Admitting: Physician Assistant

## 2013-01-12 ENCOUNTER — Ambulatory Visit (INDEPENDENT_AMBULATORY_CARE_PROVIDER_SITE_OTHER): Payer: Medicare Other | Admitting: Physician Assistant

## 2013-01-12 VITALS — BP 124/60 | HR 71 | Ht 62.5 in | Wt 166.0 lb

## 2013-01-12 DIAGNOSIS — I471 Supraventricular tachycardia: Secondary | ICD-10-CM

## 2013-01-12 DIAGNOSIS — I5032 Chronic diastolic (congestive) heart failure: Secondary | ICD-10-CM

## 2013-01-12 DIAGNOSIS — I10 Essential (primary) hypertension: Secondary | ICD-10-CM

## 2013-01-12 DIAGNOSIS — I498 Other specified cardiac arrhythmias: Secondary | ICD-10-CM

## 2013-01-12 MED ORDER — NEBIVOLOL HCL 5 MG PO TABS: 5.0000 mg | ORAL_TABLET | Freq: Every day | ORAL | Status: DC

## 2013-01-12 NOTE — Progress Notes (Signed)
[address] [place], [place]  [address] Phone: [phone] Fax:  [phone]  Date:  01/12/2013   ID:  Anwitha Mapes, DOB 1924/06/06, MRN [id number]  PCP:  Hollice Espy, MD  Cardiologist:  Dr. Tonny Bollman   Electrophysiologist:  Dr. Sherryl Manges    History of Present Illness: [name] [name] is a 79 y.o. female who returns for follow up after a recent admission to the hospital.   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  Myoview (6/14): Normal study, EF 63%.  Abdominal US (6/14):  No AAA.  She was admitted 10/3-10/9 with right lower extremity cellulitis and possible pneumonia and UTI. She had undergone a right TKR in 11/2012. She then presented to the hospital with right knee swelling and redness as well as fever and was felt to be septic. She was treated with IV antibiotics and IVFs. She was noted to have increased LE edema and chest x-ray demonstrated interstitial changes.  Echocardiogram (12/25/12):  EF 55-60%, normal wall motion, grade 2 diastolic dysfunction, mild MR, moderate TR.  She was diuresed for volume overload.  She called in recently with low BPs and was told to change Bystolic 10 QD to 5 every other day.    The patient denies chest pain, shortness of breath, syncope, orthopnea, PND or significant pedal edema. She denies palps.  Labs (10/14):   K 4, creatinine 0.53, Mg 2.1, Alb 2.7, ALT 23, Hgb 11.9  Wt Readings from Last 3 Encounters:  01/12/13 166 lb (75.297 kg)  12/29/12 174 lb 1.6 oz (78.971 kg)  12/12/12 169 lb 4 oz (76.771 kg)     Past Medical History  Diagnosis Date  . Arthritis   . Blind left eye     Corneal transplant x3 with rejection  . Dysrhythmia   . Varicose veins     both legs and left fooot at arch  . Complication of anesthesia     sensitive to meds, bp can fall  . SVT (supraventricular tachycardia) 12/15/2011    Current Outpatient Prescriptions  Medication Sig Dispense Refill   . bisacodyl (DULCOLAX) 10 MG suppository Place 1 suppository (10 mg total) rectally daily as needed.  12 suppository  0  . Esomeprazole Magnesium (NEXIUM PO) Take by mouth daily. One tablet      . ferrous sulfate 325 (65 FE) MG tablet Take 1 tablet (325 mg total) by mouth 2 (two) times daily with a meal.  60 tablet  0  . fluconazole (DIFLUCAN) 100 MG tablet Take 100 mg by mouth daily.      Marland Kitchen HYDROmorphone (DILAUDID) 2 MG tablet Take 1-2 tablets (2-4 mg total) by mouth every 4 (four) hours as needed.  20 tablet  0  . methocarbamol (ROBAXIN) 500 MG tablet Take 1 tablet (500 mg total) by mouth every 6 (six) hours as needed.  20 tablet  0  . nebivolol (BYSTOLIC) 5 MG tablet Take 5mg  tablet every other day Alternating with paxil      . PARoxetine (PAXIL) 10 MG tablet Take 10 mg by mouth every other day. Alternating with bystolic      . prednisoLONE acetate (PRED FORTE) 1 % ophthalmic suspension Place 1 drop into both eyes 2 (two) times daily.       . rivaroxaban (XARELTO) 10 MG TABS tablet Take 1 tablet (10 mg total) by mouth daily with breakfast.  30 tablet  0  . vitamin C (VITAMIN  C) 500 MG tablet Take 1 tablet (500 mg total) by mouth 2 (two) times daily.  60 tablet  0   No current facility-administered medications for this visit.    Allergies:    Allergies  Allergen Reactions  . Ultram [Tramadol] Hives  . Buprenex [Buprenorphine Hcl] Other (See Comments)    Heart raced  . Levofloxacin Other (See Comments)    levaquin unknown reaction  . Percocet [Oxycodone-Acetaminophen] Itching    "felt drained"  . Sulfa Antibiotics Other (See Comments)    unknown  . Tape     Adhesive tape causes skin to pull off  . Zetia [Ezetimibe] Other (See Comments)    unknown    Social History:  The patient  reports that she has never smoked. She has never used smokeless tobacco. She reports that she does not drink alcohol or use illicit drugs.   Family History:  The patient's family history includes Cancer  in her brother; Heart attack in her brother and father; Stroke in her mother.   ROS:  Please see the history of present illness.   All other systems reviewed and negative.   PHYSICAL EXAM: VS:  BP 124/60  Pulse 71  Ht 5' 2.5" (1.588 m)  Wt 166 lb (75.297 kg)  BMI 29.86 kg/m2  SpO2 96% Well nourished, well developed, in no acute distress HEENT: normal Neck: no JVD at 90 degrees Cardiac:  normal S1, S2; RRR; no murmur Lungs:  clear to auscultation bilaterally, no wheezing, rhonchi or rales Abd: soft, nontender, no hepatomegaly Ext: no edema Skin: warm and dry Neuro:  CNs 2-12 intact, no focal abnormalities noted  EKG:  NSR, HR 71     ASSESSMENT AND PLAN:  1. Chronic Diastolic CHF:  She is no longer on lasix.  Volume is stable.  We discussed the importance of daily weights.  She knows to call if she is gaining weight or having increasing edema.   2. SVT:  She has occasional flutters.  She is still not interested in RFCA.  She wanted to know if she should wear another monitor.  This would not likely yield any new information.  She should continue daily Rx with beta blocker.  I have asked her to change her Bystolic to 5 QD.   3. Hypertension:  Controlled.  She will keep an eye on her BP and call if it is running low. 4. Disposition:  F/u with Dr. Sherryl Manges in 6 mos.   Signed, Tereso Newcomer, PA-C  01/12/2013 3:38 PM

## 2013-01-12 NOTE — Patient Instructions (Signed)
CHANGE BYSTOLIC TO 5 MG EVERY DAY  MAKE SURE TO WEIGH DAILY AND CALL IS YOUR WT IS INCREASED BY 3 LB'S IN DAY OR IF YOU HAVE MOR SHORTNESS OF BREATH OR SWELLING  Your physician wants you to follow-up in: 6 MONTHS WITH DR. Graciela Husbands. You will receive a reminder letter in the mail two months in advance. If you don't receive a letter, please call our office to schedule the follow-up appointment.

## 2013-01-27 ENCOUNTER — Telehealth: Payer: Self-pay | Admitting: Internal Medicine

## 2013-01-27 NOTE — Telephone Encounter (Signed)
New message     On bystolic.  Had knee replacement in April and sept. Now heart beating fast.

## 2013-01-27 NOTE — Telephone Encounter (Signed)
Patient complains of increased heart rate at night around bedtime - she states it only happens at night. But there are moments that she feels weak/sweaty during day. She cannot tell me what her heart rate was because she said it was too fast for her to count. I advised her to use her blood pressure cuff that reads heart rate when symptoms occur and keep track of BP and HR over the weekend. I will review this with Tereso Newcomer next week since he was last to see patient on  10/23 and Dr. Graciela Husbands is out of office until 11/18. I advised patient to go to hospital if she became problematic over the weekend. Patient verbalized understanding and agreeable to plan.

## 2013-01-27 NOTE — Telephone Encounter (Signed)
Pt asks that you call her cell phone

## 2013-01-31 ENCOUNTER — Ambulatory Visit (INDEPENDENT_AMBULATORY_CARE_PROVIDER_SITE_OTHER): Payer: Medicare Other | Admitting: Cardiovascular Disease

## 2013-01-31 ENCOUNTER — Encounter: Payer: Self-pay | Admitting: Cardiovascular Disease

## 2013-01-31 VITALS — BP 140/80 | HR 77 | Ht 62.0 in | Wt 165.0 lb

## 2013-01-31 DIAGNOSIS — R002 Palpitations: Secondary | ICD-10-CM

## 2013-01-31 DIAGNOSIS — I10 Essential (primary) hypertension: Secondary | ICD-10-CM

## 2013-01-31 NOTE — Patient Instructions (Signed)
Your physician has recommended you make the following change in your medication: Please change your Bystolic to bedtime  Please increase your fluid intake.   Your physician has recommended that you wear an event monitor. Event monitors are medical devices that record the heart's electrical activity. Doctors most often [place] these monitors to diagnose arrhythmias. Arrhythmias are problems with the speed or rhythm of the heartbeat. The monitor is a small, portable device. You can wear one while you do your normal daily activities. This is usually used to diagnose what is causing palpitations/syncope (passing out).   Your physician recommends that you schedule a follow-up appointment in: 4-6 WEEKS with Dr Excell Seltzer

## 2013-01-31 NOTE — Progress Notes (Signed)
HPI:  79 year old woman presenting for followup evaluation. She has been followed in our practice for supraventricular tachycardia. We have managed her medically with bystolic. She has seen Dr. Graciela Husbands who offered EP study and radiofrequency ablation but she declined this.  The patient feels poorly. She complains of fatigue, labile blood pressure, and nocturnal palpitations. She's having difficulty sleeping. She denies chest pain or shortness of breath.  Previous evaluation has included an echocardiogram which showed normal left ventricular systolic function with an ejection fraction of 55-60%, grade 2 diastolic dysfunction, mild mitral regurgitation, and moderate tricuspid regurgitation. She also had a Myoview scan a few months ago demonstrating normal perfusion.  Systolic blood pressures have been as low as the 80s and as high as the 190s. Heart rates have been in the 80s.  She has undergone bilateral knee replacements this year, initially in April and then the other knee was replaced in September.  Outpatient Encounter Prescriptions as of 01/31/2013  Medication Sig  . Esomeprazole Magnesium (NEXIUM PO) Take by mouth daily. One tablet  . fluconazole (DIFLUCAN) 100 MG tablet Take 100 mg by mouth daily.  . nebivolol (BYSTOLIC) 5 MG tablet Take 1 tablet (5 mg total) by mouth daily.  Marland Kitchen PARoxetine (PAXIL) 10 MG tablet Take 10 mg by mouth every other day. Alternating with bystolic  . prednisoLONE acetate (PRED FORTE) 1 % ophthalmic suspension Place 1 drop into both eyes 2 (two) times daily.   . rivaroxaban (XARELTO) 10 MG TABS tablet Take 1 tablet (10 mg total) by mouth daily with breakfast.  . [DISCONTINUED] bisacodyl (DULCOLAX) 10 MG suppository Place 1 suppository (10 mg total) rectally daily as needed.  . [DISCONTINUED] ferrous sulfate 325 (65 FE) MG tablet Take 1 tablet (325 mg total) by mouth 2 (two) times daily with a meal.  . [DISCONTINUED] HYDROmorphone (DILAUDID) 2 MG tablet Take 1-2  tablets (2-4 mg total) by mouth every 4 (four) hours as needed.  . [DISCONTINUED] methocarbamol (ROBAXIN) 500 MG tablet Take 1 tablet (500 mg total) by mouth every 6 (six) hours as needed.  . [DISCONTINUED] vitamin C (VITAMIN C) 500 MG tablet Take 1 tablet (500 mg total) by mouth 2 (two) times daily.    Allergies  Allergen Reactions  . Ultram [Tramadol] Hives  . Buprenex [Buprenorphine Hcl] Other (See Comments)    Heart raced  . Levofloxacin Other (See Comments)    levaquin unknown reaction  . Percocet [Oxycodone-Acetaminophen] Itching    "felt drained"  . Sulfa Antibiotics Other (See Comments)    unknown  . Tape     Adhesive tape causes skin to pull off  . Zetia [Ezetimibe] Other (See Comments)    unknown    Past Medical History  Diagnosis Date  . Arthritis   . Blind left eye     Corneal transplant x3 with rejection  . Dysrhythmia   . Varicose veins     both legs and left fooot at arch  . Complication of anesthesia     sensitive to meds, bp can fall  . SVT (supraventricular tachycardia) 12/15/2011    ROS: Negative except as per HPI  BP 140/80  Pulse 77  Ht 5\' 2"  (1.575 m)  Wt 165 lb (74.844 kg)  BMI 30.17 kg/m2  PHYSICAL EXAM: Pt is alert and oriented, pleasant elderly woman in NAD HEENT: normal Neck: JVP - normal, carotids 2+= without bruits Lungs: CTA bilaterally CV: RRR without murmur or gallop Abd: soft, NT, Positive BS, no hepatomegaly Ext: no  C/C/E, distal pulses intact and equal Skin: warm/dry no rash  EKG:  Normal sinus rhythm 70 beats per minute, within normal limits.  ASSESSMENT AND PLAN: 1. Essential hypertension with episodes of hypotension. Home blood pressures reviewed from the patient's log. Primary problem has been low blood pressure. Advised her to push fluids, eat frequently, and change by systolic to nighttime dosing since most of her palpitations occur at that time. I will see her back in 4-6 weeks.  2. SVT. Palpitations have worsened.  Recommend an event monitor to further guide therapy.  3. Chronic diastolic heart failure. I do not appreciate any evidence of volume overload on her exam. Would avoid diuretics with her episodes of low blood pressure. Will continue to follow closely.  Tonny Bollman 01/31/2013 5:54 PM

## 2013-02-03 ENCOUNTER — Encounter (INDEPENDENT_AMBULATORY_CARE_PROVIDER_SITE_OTHER): Payer: Medicare Other

## 2013-02-03 ENCOUNTER — Encounter: Payer: Self-pay | Admitting: Radiology

## 2013-02-03 DIAGNOSIS — I10 Essential (primary) hypertension: Secondary | ICD-10-CM

## 2013-02-03 DIAGNOSIS — R002 Palpitations: Secondary | ICD-10-CM

## 2013-02-03 NOTE — Progress Notes (Signed)
Patient ID: [name] [name], female   DOB: January 28, 1925, 79 y.o.   MRN: [id number] 30 day e cardio cerite monitor applied

## 2013-02-07 NOTE — Telephone Encounter (Signed)
Follow up with patient about how she was doing - she states that she is feeling better. She has recently been seen with Dr. Excell Seltzer and is currently wearing an event monitor. Patient will call with further issues.

## 2013-02-23 ENCOUNTER — Other Ambulatory Visit: Payer: Self-pay

## 2013-02-23 MED ORDER — NEBIVOLOL HCL 5 MG PO TABS: 5.0000 mg | ORAL_TABLET | Freq: Every evening | ORAL | Status: DC

## 2013-02-28 ENCOUNTER — Telehealth: Payer: Self-pay | Admitting: Cardiovascular Disease

## 2013-02-28 NOTE — Telephone Encounter (Signed)
New problem   Pt has a question as to when she need to take off her heart monitor b/c her appt is sched for 03/03/13 to discuss monitor. Please call pt to advise

## 2013-02-28 NOTE — Telephone Encounter (Signed)
Per Burnett Harry the pt can go ahead and take monitor off if she would like to send it back to the company.  I made the pt aware that we will have access to her monitor report on 03/03/13 regardless if she is or is not still wearing the monitor.  At this time the pt plans on wearing monitor until completion on 03/04/13.

## 2013-03-03 ENCOUNTER — Encounter: Payer: Self-pay | Admitting: Cardiovascular Disease

## 2013-03-03 ENCOUNTER — Ambulatory Visit (INDEPENDENT_AMBULATORY_CARE_PROVIDER_SITE_OTHER): Payer: Medicare Other | Admitting: Cardiovascular Disease

## 2013-03-03 VITALS — BP 166/68 | HR 71 | Ht 62.0 in | Wt 167.2 lb

## 2013-03-03 DIAGNOSIS — I498 Other specified cardiac arrhythmias: Secondary | ICD-10-CM

## 2013-03-03 DIAGNOSIS — I471 Supraventricular tachycardia: Secondary | ICD-10-CM

## 2013-03-03 MED ORDER — NEBIVOLOL HCL 5 MG PO TABS: ORAL_TABLET | ORAL | Status: DC

## 2013-03-03 NOTE — Patient Instructions (Signed)
Your physician has recommended you make the following change in your medication:  INCREASE BYSTOLIC TAKE 1/2 TABLET 2.5 MG IN MORNING AND 1 TABLET 5 MG IN THE EVENING.  PLEASE SEND MYCHART BP WITH PULSE READINGS FOR A FEW WEEKS  Your physician wants you to follow-up in: 6 MONTHS  You will receive a reminder letter in the mail two months in advance. If you don't receive a letter, please call our office to schedule the follow-up appointment.

## 2013-03-03 NOTE — Progress Notes (Signed)
HPI:   79 year old woman presenting for followup evaluation. She has been followed in our practice for supraventricular tachycardia. We have managed her medically with bystolic. She has seen Dr. Graciela Husbands who offered EP study and radiofrequency ablation but she declined this.  Previous evaluation has included an echocardiogram which showed normal left ventricular systolic function with an ejection fraction of 55-60%, grade 2 diastolic dysfunction, mild mitral regurgitation, and moderate tricuspid regurgitation. She also had a Myoview scan a few months ago demonstrating normal perfusion.  She was seen in November 2014 and I recommended an event monitor at that time. Also recommended that she push fluids and eat frequent meals because of problems with hypotension and labile blood pressures. She returns today for followup evaluation.  The patient is doing better. She has not had palpitations since I have seen her last. Her blood pressures have been more elevated and she brings in readings today. Systolic readings range from 130-190 with most in the range of 140-160. Diastolic readings are in the 60s to 80s. She's had no chest pain, shortness of breath, or presyncope.  Outpatient Encounter Prescriptions as of 03/03/2013  Medication Sig  . aspirin EC 81 MG tablet Take 81 mg by mouth daily.  . Esomeprazole Magnesium (NEXIUM PO) Take by mouth daily. One tablet  . nebivolol (BYSTOLIC) 5 MG tablet Take 1 tablet (5 mg total) by mouth every evening.  Marland Kitchen PARoxetine (PAXIL) 10 MG tablet Take 5 mg by mouth daily.   . prednisoLONE acetate (PRED FORTE) 1 % ophthalmic suspension Place 1 drop into both eyes 2 (two) times daily.     Allergies  Allergen Reactions  . Ultram [Tramadol] Hives  . Buprenex [Buprenorphine Hcl] Other (See Comments)    Heart raced  . Levofloxacin Other (See Comments)    levaquin unknown reaction  . Percocet [Oxycodone-Acetaminophen] Itching    "felt drained"  . Sulfa Antibiotics  Other (See Comments)    unknown  . Tape     Adhesive tape causes skin to pull off  . Zetia [Ezetimibe] Other (See Comments)    unknown    Past Medical History  Diagnosis Date  . Arthritis   . Blind left eye     Corneal transplant x3 with rejection  . Dysrhythmia   . Varicose veins     both legs and left fooot at arch  . Complication of anesthesia     sensitive to meds, bp can fall  . SVT (supraventricular tachycardia) 12/15/2011    ROS: Negative except as per HPI  BP 166/68  Pulse 71  Ht 5\' 2"  (1.575 m)  Wt 167 lb 3.2 oz (75.841 kg)  BMI 30.57 kg/m2  PHYSICAL EXAM: Pt is alert and oriented, NAD Remainder of exam not performed as our time was spent reviewing the blood pressure log and discussing our treatment plan.  Event recorder strips: On November 27 there was SVT at 150 beats per minute possibly atrial flutter. Otherwise the rhythm is sinus.  ASSESSMENT AND PLAN: 1. Hypertension, labile. Recommend add diastolic 2.5 mg in the morning. She will continue 5 mg at bedtime. Continue to monitor blood pressure readings and she will e-mail these to me after the medication change.  2. SVT. Currently with acceptable control. Will view her monitor in full and it is available. I did review several rhythm strips today. She does not wish to proceed with EP study unless symptoms worsen. I think this is a reasonable approach.  Tonny Bollman 03/03/2013 11:08 AM

## 2013-03-07 ENCOUNTER — Telehealth: Payer: Self-pay | Admitting: Cardiovascular Disease

## 2013-03-07 ENCOUNTER — Other Ambulatory Visit: Payer: Self-pay

## 2013-03-07 MED ORDER — NEBIVOLOL HCL 5 MG PO TABS: ORAL_TABLET | ORAL | Status: DC

## 2013-03-07 NOTE — Telephone Encounter (Signed)
New problem   Pt called her primary care dr not in today and now [place], pt has a cough and has coughed all night 12/15.    Please call in BY STOLIC 5 mg and something for a cough.  [facility] [address]

## 2013-03-07 NOTE — Telephone Encounter (Signed)
Bystolic refill sent.  Suggested pt drink extra fluids, hot tea for c/o raspy throat and cough.  Advised she needs to speak with primary care re: cough medicine, if PCP is not in, someone in their office can help.   Pt verbalizes understanding.

## 2013-03-23 DIAGNOSIS — I639 Cerebral infarction, unspecified: Secondary | ICD-10-CM

## 2013-03-23 DIAGNOSIS — I219 Acute myocardial infarction, unspecified: Secondary | ICD-10-CM

## 2013-03-23 HISTORY — DX: Cerebral infarction, unspecified: I63.9

## 2013-03-23 HISTORY — DX: Acute myocardial infarction, unspecified: I21.9

## 2013-04-17 ENCOUNTER — Telehealth: Payer: Self-pay | Admitting: Cardiovascular Disease

## 2013-04-17 NOTE — Telephone Encounter (Signed)
Agree with plan. Suspect sx's related to GERD based on description.   [name] 04/17/2013 11:06 PM

## 2013-04-17 NOTE — Telephone Encounter (Signed)
New Message  Pt called Pt called states that she has experienced heaviness in the chest// states she was advised to restart taking Nexium and she is now experiencing this symptom// she feels it more when the lying down.

## 2013-04-17 NOTE — Telephone Encounter (Signed)
Pt. Called because she states has been experiencing heaviness in her chest this started after restarting her  Nexium. Pt feels the heaviness more when she lying down. Pt was made aware that she does not have a history of CAD and that her stress test was normal. Pt states that she went to see her PCP and she did not do anything for her, but she did have these symptoms then. Pt was encouraged to call her PCP again and see what was going on with her. Pt verbalized understanding.

## 2013-05-29 ENCOUNTER — Telehealth: Payer: Self-pay | Admitting: Internal Medicine

## 2013-05-29 NOTE — Telephone Encounter (Signed)
New problem   Pt has questions about her medications and what she should be taking please give pt a call back.

## 2013-05-29 NOTE — Telephone Encounter (Signed)
Pt asking about her Nexium. Referred back to telephone call end of January about this medication - she was then advised to follow up with her PCP about this. I again referred her to her PCP to discuss. Pt agreeable to plan.

## 2013-08-07 ENCOUNTER — Encounter: Payer: Self-pay | Admitting: Cardiology

## 2013-09-08 ENCOUNTER — Ambulatory Visit (INDEPENDENT_AMBULATORY_CARE_PROVIDER_SITE_OTHER): Payer: Medicare Other | Admitting: Cardiovascular Disease

## 2013-09-08 ENCOUNTER — Encounter: Payer: Self-pay | Admitting: Cardiovascular Disease

## 2013-09-08 VITALS — BP 130/76 | HR 68 | Ht 62.0 in | Wt 168.8 lb

## 2013-09-08 DIAGNOSIS — I498 Other specified cardiac arrhythmias: Secondary | ICD-10-CM

## 2013-09-08 DIAGNOSIS — I471 Supraventricular tachycardia: Secondary | ICD-10-CM

## 2013-09-08 DIAGNOSIS — I10 Essential (primary) hypertension: Secondary | ICD-10-CM

## 2013-09-08 NOTE — Progress Notes (Signed)
HPI:  80 year old woman presenting for followup evaluation. She has been followed in our practice for supraventricular tachycardia. We have managed her medically with bystolic. She has seen Dr. Caryl Comes who offered EP study and radiofrequency ablation but she declined this.  Previous evaluation has included an echocardiogram which showed normal left ventricular systolic function with an ejection fraction of 69-62%, grade 2 diastolic dysfunction, mild mitral regurgitation, and moderate tricuspid regurgitation. She also had a Myoview scan in 2014 demonstrating normal perfusion.  The patient has been doing relatively well. She brings in home blood pressure readings and they have been elevated. Most readings are in the 150s to 180s over 70s. She's been taking her nebivolol one quarter tablet in the morning and a full tablet in the evening. She denies chest pain, chest pressure, dyspnea, or palpitations.   Outpatient Encounter Prescriptions as of 09/08/2013  Medication Sig  . aspirin EC 81 MG tablet Take 81 mg by mouth daily.  . Melatonin 1 MG SUBL Place 1 mg under the tongue at bedtime.  . Multiple Vitamin (MULTI VITAMIN DAILY PO) Take 1 tablet by mouth every morning.  . nebivolol (BYSTOLIC) 5 MG tablet Take 1/2 tablet (2.5 mg) in the morning and 1 tablet (5 mg) in the evening.  . prednisoLONE acetate (PRED FORTE) 1 % ophthalmic suspension Place 1 drop into both eyes 2 (two) times daily.   . [DISCONTINUED] Esomeprazole Magnesium (NEXIUM PO) Take by mouth daily. One tablet  . [DISCONTINUED] PARoxetine (PAXIL) 10 MG tablet Take 5 mg by mouth daily.     Allergies  Allergen Reactions  . Ultram [Tramadol] Hives  . Buprenex [Buprenorphine Hcl] Other (See Comments)    Heart raced  . Levofloxacin Other (See Comments)    levaquin unknown reaction  . Percocet [Oxycodone-Acetaminophen] Itching    "felt drained"  . Sulfa Antibiotics Other (See Comments)    unknown  . Tape     Adhesive tape causes skin  to pull off  . Zetia [Ezetimibe] Other (See Comments)    unknown    Past Medical History  Diagnosis Date  . Arthritis   . Blind left eye     Corneal transplant x3 with rejection  . Dysrhythmia   . Varicose veins     both legs and left fooot at arch  . Complication of anesthesia     sensitive to meds, bp can fall  . SVT (supraventricular tachycardia) 12/15/2011    ROS: Negative except as per HPI  BP 130/76  Pulse 68  Ht 5\' 2"  (1.575 m)  Wt 76.567 kg (168 lb 12.8 oz)  BMI 30.87 kg/m2  PHYSICAL EXAM: Pt is alert and oriented, NAD HEENT: normal Neck: JVP - normal, carotids 2+= without bruits Lungs: CTA bilaterally CV: RRR without murmur or gallop Abd: soft, NT, Positive BS, no hepatomegaly Ext: no C/C/E, distal pulses intact and equal Skin: warm/dry no rash  EKG:  Normal sinus rhythm 68 beats per minute, cannot rule out anterior infarct age undetermined.  ASSESSMENT AND PLAN: 1. Essential hypertension, suboptimal control. Advised the patient to go back to one half nebivolol tablet in the morning, and one full tablet in the evening. She's been adding salt to her food, and we discussed a sodium restriction diet. She will continue to monitor home blood pressures and will call in a few weeks with readings. I will see her back next year. She is very reluctant to add any more medication.  2. Paroxysmal SVT. Appears to be controlled  as the patient has minimal symptoms. She did have episodes of SVT on her event monitor last year. Will refer to EP only if symptoms worsen.  [name] 09/08/2013 10:47 AM

## 2013-09-08 NOTE — Patient Instructions (Signed)
Your physician has requested that you regularly monitor and record your blood pressure readings at home. Please use the same machine at the same time of day to check your readings and record them to bring to your follow-up visit. Please call the office in a few weeks with your BP readings.   Your physician wants you to follow-up in: 1 YEAR with Dr Burt Knack.  You will receive a reminder letter in the mail two months in advance. If you don't receive a letter, please call our office to schedule the follow-up appointment.  Low-Sodium Eating Plan Sodium raises blood pressure and causes water to be held in the body. Getting less sodium from food will help lower your blood pressure, reduce any swelling, and protect your heart, liver, and kidneys. We get sodium by adding salt (sodium chloride) to food. Most of our sodium comes from canned, boxed, and frozen foods. Restaurant foods, fast foods, and pizza are also very high in sodium. Even if you take medicine to lower your blood pressure or to reduce fluid in your body, getting less sodium from your food is important. WHAT IS MY PLAN? Most people should limit their sodium intake to 2,300 mg a day. Your health care provider recommends that you limit your sodium intake to __________ a day.  WHAT DO I NEED TO KNOW ABOUT THIS EATING PLAN? For the low-sodium eating plan, you will follow these general guidelines:  Choose foods with a % Daily Value for sodium of less than 5% (as listed on the food label).   Use salt-free seasonings or herbs instead of table salt or sea salt.   Check with your health care provider or pharmacist before using salt substitutes.   Eat fresh foods.  Eat more vegetables and fruits.  Limit canned vegetables. If you do use them, rinse them well to decrease the sodium.   Limit cheese to 1 oz (28 g) per day.   Eat lower-sodium products, often labeled as "lower sodium" or "no salt added."  Avoid foods that contain monosodium  glutamate (MSG). MSG is sometimes added to [place] food and some canned foods.  Check food labels (Nutrition Facts labels) on foods to learn how much sodium is in one serving.  Eat more home-cooked food and less restaurant, buffet, and fast food.  When eating at a restaurant, ask that your food be prepared with less salt or none, if possible.  HOW DO I READ FOOD LABELS FOR SODIUM INFORMATION? The Nutrition Facts label lists the amount of sodium in one serving of the food. If you eat more than one serving, you must multiply the listed amount of sodium by the number of servings. Food labels may also identify foods as:  Sodium free--Less than 5 mg in a serving.  Very low sodium--35 mg or less in a serving.  Low sodium--140 mg or less in a serving.  Light in sodium--50% less sodium in a serving. For example, if a food that usually has 300 mg of sodium is changed to become light in sodium, it will have 150 mg of sodium.  Reduced sodium--25% less sodium in a serving. For example, if a food that usually has 400 mg of sodium is changed to reduced sodium, it will have 300 mg of sodium. WHAT FOODS CAN I EAT? Grains Low-sodium cereals, including oats, puffed wheat and rice, and shredded wheat cereals. Low-sodium crackers. Unsalted rice and pasta. Lower-sodium bread.  Vegetables Frozen or fresh vegetables. Low-sodium or reduced-sodium canned vegetables. Low-sodium or reduced-sodium  tomato sauce and paste. Low-sodium or reduced-sodium tomato and vegetable juices.  Fruits Fresh, frozen, and canned fruit. Fruit juice.  Meat and Other Protein Products Low-sodium canned tuna and salmon. Fresh or frozen meat, poultry, seafood, and fish. Lamb. Unsalted nuts. Dried beans, peas, and lentils without added salt. Unsalted canned beans. Homemade soups without salt. Eggs.  Dairy Milk. Soy milk. Ricotta cheese. Low-sodium or reduced-sodium cheeses. Yogurt.  Condiments Fresh and dried herbs and spices.  Salt-free seasonings. Onion and garlic powders. Low-sodium varieties of mustard and ketchup. Lemon juice.  Fats and Oils Reduced-sodium salad dressings. Unsalted butter.  Other Unsalted popcorn and pretzels.  The items listed above may not be a complete list of recommended foods or beverages. Contact your dietitian for more options. WHAT FOODS ARE NOT RECOMMENDED? Grains Instant hot cereals. Bread stuffing, pancake, and biscuit mixes. Croutons. Seasoned rice or pasta mixes. Noodle soup cups. Boxed or frozen macaroni and cheese. Self-rising flour. Regular salted crackers. Vegetables Regular canned vegetables. Regular canned tomato sauce and paste. Regular tomato and vegetable juices. Frozen vegetables in sauces. Salted french fries. Olives. Angie Fava. Relishes. Sauerkraut. Salsa. Meat and Other Protein Products Salted, canned, smoked, spiced, or pickled meats, seafood, or fish. Bacon, ham, sausage, hot dogs, corned beef, chipped beef, and packaged luncheon meats. Salt pork. Jerky. Pickled herring. Anchovies, regular canned tuna, and sardines. Salted nuts. Dairy Processed cheese and cheese spreads. Cheese curds. Blue cheese and cottage cheese. Buttermilk.  Condiments Onion and garlic salt, seasoned salt, table salt, and sea salt. Canned and packaged gravies. Worcestershire sauce. Tartar sauce. Barbecue sauce. Teriyaki sauce. Soy sauce, including reduced sodium. Steak sauce. Fish sauce. Oyster sauce. Cocktail sauce. Horseradish. Regular ketchup and mustard. Meat flavorings and tenderizers. Bouillon cubes. Hot sauce. Tabasco sauce. Marinades. Taco seasonings. Relishes. Fats and Oils Regular salad dressings. Salted butter. Margarine. Ghee. Bacon fat.  Other Potato and tortilla chips. Corn chips and puffs. Salted popcorn and pretzels. Canned or dried soups. Pizza. Frozen entrees and pot pies.  The items listed above may not be a complete list of foods and beverages to avoid. Contact your  dietitian for more information. Document Released: 08/29/2001 Document Revised: 03/14/2013 Document Reviewed: 01/11/2013 [facility] Patient Information 2015 [place], [place]. This information is not intended to replace advice given to you by your health care provider. Make sure you discuss any questions you have with your health care provider.

## 2013-10-16 ENCOUNTER — Encounter: Payer: Self-pay | Admitting: Podiatry

## 2013-10-16 ENCOUNTER — Ambulatory Visit (INDEPENDENT_AMBULATORY_CARE_PROVIDER_SITE_OTHER): Payer: Medicare Other | Admitting: Podiatry

## 2013-10-16 ENCOUNTER — Ambulatory Visit (INDEPENDENT_AMBULATORY_CARE_PROVIDER_SITE_OTHER): Payer: Medicare Other

## 2013-10-16 VITALS — BP 143/77 | HR 70 | Resp 18

## 2013-10-16 DIAGNOSIS — S92309A Fracture of unspecified metatarsal bone(s), unspecified foot, initial encounter for closed fracture: Secondary | ICD-10-CM

## 2013-10-16 DIAGNOSIS — R52 Pain, unspecified: Secondary | ICD-10-CM
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
## 2013-10-16 NOTE — Patient Instructions (Signed)
Metatarsal Fracture, Undisplaced  A metatarsal fracture is a break in the bone(s) of the foot. These are the bones of the foot that connect your toes to the bones of the ankle.  DIAGNOSIS   The diagnoses of these fractures are usually made with X-rays. If there are problems in the forefoot and x-rays are normal a later bone scan will usually make the diagnosis.   TREATMENT AND HOME CARE INSTRUCTIONS  · Treatment may or may not include a cast or walking shoe. When casts are needed the use is usually for short periods of time so as not to slow down healing with muscle wasting (atrophy).  · Activities should be stopped until further advised by your caregiver.  · Wear shoes with adequate shock absorbing capabilities and stiff soles.  · Alternative exercise may be undertaken while waiting for healing. These may include bicycling and swimming, or as your caregiver suggests.  · It is important to keep all follow-up visits or specialty referrals. The failure to keep these appointments could result in improper bone healing and chronic pain or disability.  · Warning: Do not drive a car or operate a motor vehicle until your caregiver specifically tells you it is safe to do so.  IF YOU DO NOT HAVE A CAST OR SPLINT:  · You may walk on your injured foot as tolerated or advised.  · Do not put any weight on your injured foot for as long as directed by your caregiver. Slowly increase the amount of time you walk on the foot as the pain allows or as advised.  · Use crutches until you can bear weight without pain. A gradual increase in weight bearing may help.  · Apply ice to the injury for 15-20 minutes each hour while awake for the first 2 days. Put the ice in a plastic bag and place a towel between the bag of ice and your skin.  · Only take over-the-counter or prescription medicines for pain, discomfort, or fever as directed by your caregiver.  SEEK IMMEDIATE MEDICAL CARE IF:   · Your cast gets damaged or breaks.  · You have  continued severe pain or more swelling than you did before the cast was put on, or the pain is not controlled with medications.  · Your skin or nails below the injury turn blue or grey, or feel cold or numb.  · There is a bad smell, or new stains or pus-like (purulent) drainage coming from the cast.  MAKE SURE YOU:   · Understand these instructions.  · Will watch your condition.  · Will get help right away if you are not doing well or get worse.  Document Released: 11/29/2001 Document Revised: 06/01/2011 Document Reviewed: 10/21/2007  ExitCare® Patient Information ©2015 [employer]. This information is not intended to replace advice given to you by your health care provider. Make sure you discuss any questions you have with your health care provider.

## 2013-10-17 ENCOUNTER — Telehealth: Payer: Self-pay | Admitting: *Deleted

## 2013-10-17 ENCOUNTER — Encounter: Payer: Self-pay | Admitting: Podiatry

## 2013-10-17 NOTE — Telephone Encounter (Signed)
In yesterday, I have a bone broken in my foot.  They gave me a shoe type to wear.  It's flat on the bottom and hard.  When I wear a tennis shoe my foot seems not to hurt as much.  Is it okay to wear that instead of the shoe?  Please give me a call.

## 2013-10-17 NOTE — Telephone Encounter (Signed)
I called and informed her it's best to wear the shoe that Dr. Amalia Hailey gave her so the fracture will not displace any further.  She stated it feels better in the sneaker.  I been trying to stay off it and using the walker a little.  Is there anything else you can suggest?  I told her if she can tolerate it use some Advil or Aleve for the pain.  She stated okay all right.

## 2013-10-19 ENCOUNTER — Encounter: Payer: Self-pay | Admitting: Podiatrist

## 2013-10-19 ENCOUNTER — Ambulatory Visit (INDEPENDENT_AMBULATORY_CARE_PROVIDER_SITE_OTHER): Payer: Medicare Other | Admitting: Podiatrist

## 2013-10-19 VITALS — BP 134/64 | HR 62 | Resp 12

## 2013-10-19 DIAGNOSIS — M79673 Pain in unspecified foot: Secondary | ICD-10-CM

## 2013-10-19 DIAGNOSIS — M79609 Pain in unspecified limb: Secondary | ICD-10-CM

## 2013-10-19 DIAGNOSIS — B351 Tinea unguium: Secondary | ICD-10-CM

## 2013-10-19 NOTE — Progress Notes (Signed)
HPI:  Patient presents today for follow up of foot and nail care. She was diagnosed with a fracture of her left foot on Monday by dr. Amalia Hailey.  She wants to know if she has to wear the darco shoe dispensed as it is hard and uncomfortable for her.  She prefers to wear her sneaker.  She has brusing on the left foot from her injury.  Objective:  Patients chart is reviewed.  Vascular status reveals pedal pulses noted at 1 out of 4 dp and pt bilateral .  Neurological sensation is Normal to [employer] monofilament bilateral.  Multiple varicosities are present. Bruising on the left foot and digits is noted. Patients nails are thickened, discolored, distrophic, friable and brittle with yellow-brown discoloration. Patient subjectively relates they are painful with shoes and with ambulation of bilateral feet.  Assessment:  Symptomatic onychomycosis ,  Plan:  Discussed treatment options and alternatives.  The symptomatic toenails were debrided through manual an mechanical means without complication. Recommended that she wear the Darco shoe and padding was added to help make the shoe more comfortable. She will followup  for the fracture

## 2013-11-22 ENCOUNTER — Encounter: Payer: Self-pay | Admitting: Podiatry

## 2013-11-22 ENCOUNTER — Ambulatory Visit (INDEPENDENT_AMBULATORY_CARE_PROVIDER_SITE_OTHER): Payer: Medicare Other

## 2013-11-22 ENCOUNTER — Ambulatory Visit (INDEPENDENT_AMBULATORY_CARE_PROVIDER_SITE_OTHER): Payer: Medicare Other | Admitting: Podiatry

## 2013-11-22 VITALS — BP 156/72 | HR 66 | Resp 15

## 2013-11-22 DIAGNOSIS — S92309A Fracture of unspecified metatarsal bone(s), unspecified foot, initial encounter for closed fracture: Secondary | ICD-10-CM

## 2013-11-22 NOTE — Patient Instructions (Signed)
Continue to wear surgical shoe on the left foot when walking and standing Limit the amount of walking and standing is much as possible

## 2013-11-23 ENCOUNTER — Encounter: Payer: Self-pay | Admitting: Podiatry

## 2013-11-23 ENCOUNTER — Telehealth: Payer: Self-pay | Admitting: Cardiovascular Disease

## 2013-11-23 ENCOUNTER — Telehealth: Payer: Self-pay | Admitting: *Deleted

## 2013-11-23 NOTE — Progress Notes (Signed)
Patient ID: [name] [name], female   DOB: 01/19/1925, 80 y.o.   MRN: [id number]  Subjective: This patient presents with her son for follow care for fracture fourth left metatarsal and a pending beach trip. She states that the left foot is feeling better, however, feels a prominent area at the plantar third left metatarsal area  Objective: Orientated x3 space white female Low-grade edema noted dorsal aspect of the left foot Palpable tenderness third left metatarsal shaft without any crepitus The plantar third metatarsal head appears prominent to palpation relative to the adjacent metatarsals  X-ray examination left foot  Oblique fracture fourth metatarsal with bone callus formation  Fracture line still visible with minimal displacement  X-ray artifact noted on lateral view  Radiographic impression:  Healing fourth metatarsal fracture  Assessment: Healing fourth left metatarsal fracture with minimal dorsal displacement of the distal fragment  Relative plantar flexion of third left metatarsal resulting from dorsal angulation of adjacent fourth left metatarsal, post fracture  Plan: Continue wearing surgical shoe with Plastizote insole Limits standing and walking Okay to go to beach advising minimal standing and walking  Reappoint x6 weeks for progress x-ray left foot

## 2013-11-23 NOTE — Telephone Encounter (Signed)
New Message  Janese per Dr. Alessandra Grout office called. She reports Dr. Cheron Schaumann has increased bystolic dosage. Please call the pt to follow up. The office is faxing over office notes and latest EKG.

## 2013-11-23 NOTE — Telephone Encounter (Signed)
My mother was just there yesterday.  We're getting ready to go to the beach.  They put her in a black shoe.  She doesn't want to get it dirty and messed up.  Do you have any shoes there that someone may have no longer needed and brought back that she can have?  I told her no, we are not able to accept shoes that have been used.  I told her we can sale her another one.  She asked how much does it cost.  I told her I think it is $35.  She stated no, is there any other shoe she is able to wear other than that shoe like a tennis shoe or something.  I told her no.  She stated well we'll check around at [facility] to see if they have any.  I told her okay, I'm sorry.  She stated I am sorry too.

## 2013-11-24 MED ORDER — NEBIVOLOL HCL 5 MG PO TABS: 5.0000 mg | ORAL_TABLET | Freq: Two times a day (BID) | ORAL | Status: DC

## 2013-11-24 NOTE — Telephone Encounter (Signed)
Office note received from Dr Cheron Schaumann.  Pt's bystolic was increased to 5mg  twice a day.  I will place this note in Dr Antionette Char folder for review.

## 2013-11-24 NOTE — Telephone Encounter (Signed)
No changes recommended by Dr Burt Knack after he reviewed the pt's office note from Dr Cheron Schaumann. I will update her medication list.

## 2013-12-02 ENCOUNTER — Telehealth: Payer: Self-pay | Admitting: Cardiology

## 2013-12-02 NOTE — Telephone Encounter (Signed)
Daughter and pt calling about pre-syncopal episode that occurred this evening. Stood up from chair and felt like she was going to pass out. Sat down before injury occurred.  Still feels "different" but no focal weakness, chest pain, SOB. BP after event of 200s, now down to 180s which is near her baseline. Recently in hospital for diverticulitis, lives alone. Relayed that I thought it was mild orthostasis but pt disagreed. Recommended ER visit if she was still concerned. Pt and daughter verbalized understanding. FYI to Dr. Burt Knack

## 2013-12-04 ENCOUNTER — Telehealth: Payer: Self-pay | Admitting: Cardiovascular Disease

## 2013-12-04 ENCOUNTER — Telehealth: Payer: Self-pay | Admitting: Internal Medicine

## 2013-12-04 ENCOUNTER — Ambulatory Visit: Payer: Medicare Other | Admitting: Podiatry

## 2013-12-04 NOTE — Telephone Encounter (Signed)
Spoke with patient and while she was at the beach, she has abdominal pain that sent her to ED. She was diagnosed with diverticulitis. She did have a CT scan. She is on Amoxicillin 875 mg. She was told to f/u with local GI. Hx Brodie. Scheduled with Alonza Bogus, PA on 12/06/13 at 2:30 PM. She will bring ED records with her.

## 2013-12-04 NOTE — Telephone Encounter (Signed)
The pt spoke with on-call staff 12/02/13.  The pt had a similar episode last night but "not as bad". The pt has been monitoring her BP at home over time and her readings are all elevated.  The pt did not adjust Bystolic as ordered by her PCP. The pt is taking 2.5mg  in the morning and 5 mg in the evening. I will update her medication list. Appointment scheduled tomorrow with Dr Burt Knack.

## 2013-12-04 NOTE — Telephone Encounter (Signed)
New Message  Pt called states that her BP was high through out the night she considers it "strange". She states that everything went black so she had to sit down. She also reports left arm  muscle and chest pain. No symptoms currently. Requests a same day appt and a call back to discuss.

## 2013-12-05 ENCOUNTER — Ambulatory Visit (INDEPENDENT_AMBULATORY_CARE_PROVIDER_SITE_OTHER): Payer: Medicare Other | Admitting: Cardiovascular Disease

## 2013-12-05 ENCOUNTER — Encounter: Payer: Self-pay | Admitting: Cardiovascular Disease

## 2013-12-05 VITALS — BP 130/68 | HR 65 | Ht 62.0 in | Wt 169.8 lb

## 2013-12-05 DIAGNOSIS — R42 Dizziness and giddiness: Secondary | ICD-10-CM

## 2013-12-05 DIAGNOSIS — I10 Essential (primary) hypertension: Secondary | ICD-10-CM

## 2013-12-05 MED ORDER — AMLODIPINE BESYLATE 2.5 MG PO TABS: 2.5000 mg | ORAL_TABLET | Freq: Every day | ORAL | Status: DC

## 2013-12-05 MED ORDER — NEBIVOLOL HCL 5 MG PO TABS: 5.0000 mg | ORAL_TABLET | Freq: Two times a day (BID) | ORAL | Status: DC

## 2013-12-05 NOTE — Patient Instructions (Signed)
Your physician has recommended you make the following change in your medication:  1. INCREASE Bystolic to 5mg  take one by mouth twice a day 2. START Amolodipine 2.5mg  take one by mouth daily  Your physician recommends that you schedule a follow-up appointment in: 4 WEEKS with Truitt Merle NP  Your physician recommends that you schedule a follow-up appointment in: January 2016 with Dr Burt Knack

## 2013-12-05 NOTE — Progress Notes (Signed)
HPI:   80 year old woman presenting for followup of supraventricular tachycardia and hypertension. Her blood pressure has been labile over the years.  Previous evaluation has included an echocardiogram which showed normal left ventricular systolic function with an ejection fraction of 16-10%, grade 2 diastolic dysfunction, mild mitral regurgitation, and moderate tricuspid regurgitation. She also had a Myoview scan in 2014 demonstrating normal perfusion.   The patient has been experiencing left lower corner abdominal pain. She was diagnosed with diverticulitis about 5 days ago and has been started on antibiotics. She had a near syncopal episode 3 days ago and called into the answering service. The episode occurred when she went from sitting to standing. Following this, her blood pressure was greater than 200. As time went on the blood pressure continued to rise and she called in to the answering service. She did not seek further medical attention. Home readings have been ranging from 170 to 200s over 60s to 70s. She reports normal intake of fluids. At times when her blood pressure has been elevated she has felt the discomfort in her chest. She's had no exertional chest pain or pressure. No dyspnea, edema, orthopnea, or PND.  Outpatient Encounter Prescriptions as of 12/05/2013  Medication Sig  . amoxicillin-clavulanate (AUGMENTIN) 875-125 MG per tablet   . aspirin EC 81 MG tablet Take 81 mg by mouth daily.  . Melatonin 1 MG SUBL Place 1 mg under the tongue at bedtime.  . Multiple Vitamin (MULTI VITAMIN DAILY PO) Take 1 tablet by mouth every morning.  . nebivolol (BYSTOLIC) 5 MG tablet Take 2.5 mg in the morning and 5 mg in the evening  . oxyCODONE-acetaminophen (PERCOCET/ROXICET) 5-325 MG per tablet   . prednisoLONE acetate (PRED FORTE) 1 % ophthalmic suspension Place 1 drop into both eyes 2 (two) times daily.     Allergies  Allergen Reactions  . Ultram [Tramadol] Hives  . Buprenex  [Buprenorphine Hcl] Other (See Comments)    Heart raced  . Levofloxacin Other (See Comments)    levaquin unknown reaction  . Percocet [Oxycodone-Acetaminophen] Itching    "felt drained"  . Sulfa Antibiotics Other (See Comments)    unknown  . Tape     Adhesive tape causes skin to pull off  . Zetia [Ezetimibe] Other (See Comments)    unknown    Past Medical History  Diagnosis Date  . Arthritis   . Blind left eye     Corneal transplant x3 with rejection  . Dysrhythmia   . Varicose veins     both legs and left fooot at arch  . Complication of anesthesia     sensitive to meds, bp can fall  . SVT (supraventricular tachycardia) 12/15/2011    ROS: Negative except as per HPI  Ht 5\' 2"  (1.575 m)  PHYSICAL EXAM: Pt is alert and oriented, very pleasant elderly woman in NAD HEENT: normal Neck: JVP - normal, carotids 2+= without bruits Lungs: CTA bilaterally CV: RRR without murmur or gallop Abd: soft, NT, Positive BS, no hepatomegaly Ext: no C/C/E, distal pulses intact and equal Skin: warm/dry no rash  EKG:  Normal sinus rhythm 65 beats per minute, otherwise within normal limits.  ASSESSMENT AND PLAN: 1. Essential hypertension, malignant. The patient's blood pressure has been consistently elevated. She has a wide pulse pressure noted. Blood pressure seems to be very sensitive to either physical or emotional stress. I have recommended that she increase by systolic to 5 mg twice a day and start amlodipine 2.5 mg daily.  Will arrange followup in about 4 weeks with Truitt Merle for further med titration. I will plan on seeing her back in 3 months. The patient was also advised she could use MyChart to communicate blood pressure readings.  2. Paroxysmal SVT. One episode of palpitations noted. Will continue beta blockade for now. If symptoms worsen will refer her back to Dr. Caryl Comes.  [name] MD 12/05/2013 8:50 AM

## 2013-12-06 ENCOUNTER — Ambulatory Visit (INDEPENDENT_AMBULATORY_CARE_PROVIDER_SITE_OTHER): Payer: Medicare Other | Admitting: Gastroenterology

## 2013-12-06 ENCOUNTER — Encounter: Payer: Self-pay | Admitting: Gastroenterology

## 2013-12-06 VITALS — BP 140/80 | HR 64 | Ht 62.0 in | Wt 169.0 lb

## 2013-12-06 DIAGNOSIS — K5732 Diverticulitis of large intestine without perforation or abscess without bleeding: Secondary | ICD-10-CM

## 2013-12-06 NOTE — Patient Instructions (Signed)
Complete your antibiotics  Please purchase a probiotic over the counter and take once daily Examples are: Florastor, Align or Culturelle  Follow up as needed

## 2013-12-07 ENCOUNTER — Encounter: Payer: Self-pay | Admitting: Gastroenterology

## 2013-12-07 DIAGNOSIS — K5732 Diverticulitis of large intestine without perforation or abscess without bleeding: Secondary | ICD-10-CM | POA: Insufficient documentation

## 2013-12-07 NOTE — Progress Notes (Signed)
12/07/2013 [name] [name] [id number] November 26, 1924   HISTORY OF PRESENT ILLNESS:  This is a pleasant 80 year old female who is here today with her daughter for follow-up from an ED visit in [place] last week.  The patient presented to [facility] in [place], [place] with complaints of rather sudden onset of left sided abdominal pain.  She had CT scan of the abdomen and pelvis with contrast that showed diffuse thickening of the sigmoid colon with sigmoid diverticulosis and peri-mesenteric inflammatory changes most compatible with acute sigmoid diverticulitis.  She was placed on a 10 day course of Augment, which she is still taking.  Still has some mild discomfort, but much improved.    She says that prior to this abdominal pain she had been experiencing some increase frequency and urgency of her stools.  Was having 5 or 6 BM's per day, not necessarily diarrhea, but very soft and urgent.  This had been occuring for about the past 5 or 6 months.  Denies seeing blood in her stool.  Currently her bowel movements are ok.  Thinks that the antibiotics are causing some nausea.  She had a colonoscopy by Dr. Olevia Perches in 06/2002 at which time she was found to have only diverticulosis.  EGD in 05/2002 showed gastritis.   Past Medical History  Diagnosis Date  . Arthritis   . Blind left eye     Corneal transplant x3 with rejection  . Dysrhythmia   . Varicose veins     both legs and left fooot at arch  . Complication of anesthesia     sensitive to meds, bp can fall  . SVT (supraventricular tachycardia) 12/15/2011   Past Surgical History  Procedure Laterality Date  . Cholecystectomy    . Tonsillectomy    . Tonsillectomy    . Corneal transplant  right 2009, left 2009    both eyes, 3 in left eye, 1 in right eye  . Cataract surgery  2005    both eyes  . Abdominal hysterectomy   sept 1985    ovaries removed  . Total knee arthroplasty Left 06/27/2012    Procedure: LEFT TOTAL KNEE ARTHROPLASTY;   Surgeon: Gearlean Alf, MD;  Location: [facility];  Service: Orthopedics;  Laterality: Left;  . Tubal ligation    . Total knee arthroplasty Right 12/12/2012    Procedure: RIGHT TOTAL KNEE ARTHROPLASTY;  Surgeon: Gearlean Alf, MD;  Location: [facility];  Service: Orthopedics;  Laterality: Right;    reports that she has never smoked. She has never used smokeless tobacco. She reports that she does not drink alcohol or use illicit drugs. family history includes Cancer in her brother; Heart attack in her brother and father; Stroke in her mother. Allergies  Allergen Reactions  . Ultram [Tramadol] Hives  . Buprenex [Buprenorphine Hcl] Other (See Comments)    Heart raced  . Levofloxacin Other (See Comments)    levaquin unknown reaction  . Percocet [Oxycodone-Acetaminophen] Itching    "felt drained"  . Sulfa Antibiotics Other (See Comments)    unknown  . Tape     Adhesive tape causes skin to pull off  . Zetia [Ezetimibe] Other (See Comments)    unknown      Outpatient Encounter Prescriptions as of 12/06/2013  Medication Sig  . amLODipine (NORVASC) 2.5 MG tablet Take 1 tablet (2.5 mg total) by mouth daily.  Marland Kitchen amoxicillin-clavulanate (AUGMENTIN) 875-125 MG per tablet   . aspirin EC 81 MG tablet Take  81 mg by mouth daily.  Marland Kitchen HYDROmorphone (DILAUDID) 2 MG tablet Take 2 mg by mouth every 8 (eight) hours as needed for severe pain.  . Melatonin 1 MG SUBL Place 1 mg under the tongue at bedtime.  . Multiple Vitamin (MULTI VITAMIN DAILY PO) Take 1 tablet by mouth every morning.  . nebivolol (BYSTOLIC) 5 MG tablet Take 1 tablet (5 mg total) by mouth 2 (two) times daily.  . prednisoLONE acetate (PRED FORTE) 1 % ophthalmic suspension Place 1 drop into both eyes 2 (two) times daily.      REVIEW OF SYSTEMS  : All other systems reviewed and negative except where noted in the History of Present Illness.   PHYSICAL EXAM: BP 140/80  Pulse 64  Ht 5\' 2"  (1.575 m)  Wt 169 lb (76.658 kg)  BMI 30.90  kg/m2 General: Well developed white female in no acute distress Head: Normocephalic and atraumatic Eyes:  Aclerae anicteric, conjunctiva pink.  Left eye is cloudy. Ears: Normal auditory acuity Lungs: Clear throughout to auscultation Heart: Regular rate and rhythm Abdomen: Soft, non-distended.  Normal bowel sounds.  Minimal LLQ TTP without R/R/G. Musculoskeletal: Symmetrical with no gross deformities  Skin: No lesions on visible extremities Extremities: No edema  Neurological: Alert oriented x 4, grossly non-focal Psychological:  Alert and cooperative. Normal mood and affect  ASSESSMENT AND PLAN: -Diverticulitis:  Will complete 10 day course of Augmentin.  Will start a daily probiotic as well.  Will return prn for ongoing pain or any other issues including recurrence of soft/urgent stools.

## 2013-12-07 NOTE — Progress Notes (Signed)
Reviewed. I think she should have a follow up OV in 8-12 weeks to have a chance to re-consider her condition  And decide if a follow up CT scan warranted.

## 2013-12-08 ENCOUNTER — Encounter: Payer: Self-pay | Admitting: *Deleted

## 2013-12-12 ENCOUNTER — Encounter (HOSPITAL_COMMUNITY): Payer: Self-pay | Admitting: Cardiovascular Disease

## 2013-12-13 ENCOUNTER — Encounter: Payer: Self-pay | Admitting: Internal Medicine

## 2013-12-14 ENCOUNTER — Encounter: Payer: Self-pay | Admitting: *Deleted

## 2013-12-18 ENCOUNTER — Other Ambulatory Visit: Payer: Self-pay

## 2013-12-18 DIAGNOSIS — R42 Dizziness and giddiness: Secondary | ICD-10-CM

## 2013-12-18 DIAGNOSIS — I10 Essential (primary) hypertension: Secondary | ICD-10-CM

## 2013-12-18 MED ORDER — AMLODIPINE BESYLATE 5 MG PO TABS: 5.0000 mg | ORAL_TABLET | Freq: Every day | ORAL | Status: DC

## 2013-12-26 ENCOUNTER — Encounter (HOSPITAL_COMMUNITY): Payer: Self-pay | Admitting: Cardiovascular Disease

## 2013-12-29 ENCOUNTER — Telehealth: Payer: Self-pay | Admitting: Internal Medicine

## 2013-12-29 MED ORDER — AMOXICILLIN-POT CLAVULANATE 875-125 MG PO TABS: ORAL_TABLET | ORAL | Status: DC

## 2013-12-29 NOTE — Telephone Encounter (Signed)
Spoke with patient and she states last night, she started having lower left abdominal pain like she had in September when she had diverticulitis. States she still has pain today. Denies diarrhea, constipation or fever. She does have slight nausea but no vomiting. She is scheduled to see Dr. Olevia Perches on 02/02/14. Please, advise.

## 2013-12-29 NOTE — Telephone Encounter (Signed)
We can re-treat her for diverticulitis with a course of Augmentin 875 mg BID for 7 days.

## 2013-12-29 NOTE — Telephone Encounter (Signed)
Patient notified of recommendations. Rx sent. 

## 2013-12-30 ENCOUNTER — Telehealth: Payer: Self-pay | Admitting: Gastroenterology

## 2013-12-30 MED ORDER — PROMETHAZINE HCL 12.5 MG PO TABS: 12.5000 mg | ORAL_TABLET | Freq: Four times a day (QID) | ORAL | Status: DC | PRN

## 2013-12-30 MED ORDER — METRONIDAZOLE 500 MG PO TABS: 500.0000 mg | ORAL_TABLET | Freq: Two times a day (BID) | ORAL | Status: DC

## 2013-12-30 NOTE — Telephone Encounter (Signed)
See note from earlier today. Pts grandson calling. Pt had N/V again and has mild, stable LLQ pain. No fevers, chills. Will send in anti emetic at her request. Clear liquid diet today and advance as tolerated tomorrow. Advised to go to ED today if N/V does not resolve this afternoon, any new symptoms develop or if any current symptoms worsen. [name] [name] expresses a clear understanding of my recommendations and expresses appreciation for my help.

## 2013-12-30 NOTE — Telephone Encounter (Signed)
On call note. Pt started Augmentin yesterday for possible diverticulitis. Had N/V today and she feels its Augmentin side effect. Offered anti emetic and continue Augmentin, she declined and wants another antibiotic. Allergies reviewed. DC Augmentin. Start Flagyl 500 mg po bid for 10 days. Call back or go to ED if symptoms do not improve.

## 2014-01-01 ENCOUNTER — Other Ambulatory Visit (INDEPENDENT_AMBULATORY_CARE_PROVIDER_SITE_OTHER): Payer: Medicare Other

## 2014-01-01 ENCOUNTER — Encounter: Payer: Self-pay | Admitting: Gastroenterology

## 2014-01-01 ENCOUNTER — Telehealth: Payer: Self-pay | Admitting: Internal Medicine

## 2014-01-01 ENCOUNTER — Ambulatory Visit (INDEPENDENT_AMBULATORY_CARE_PROVIDER_SITE_OTHER): Payer: Medicare Other | Admitting: Gastroenterology

## 2014-01-01 ENCOUNTER — Other Ambulatory Visit: Payer: Self-pay | Admitting: *Deleted

## 2014-01-01 VITALS — BP 134/68 | HR 62 | Ht 62.0 in | Wt 170.0 lb

## 2014-01-01 DIAGNOSIS — R1084 Generalized abdominal pain: Secondary | ICD-10-CM

## 2014-01-01 DIAGNOSIS — Z8719 Personal history of other diseases of the digestive system: Secondary | ICD-10-CM

## 2014-01-01 DIAGNOSIS — R1032 Left lower quadrant pain: Secondary | ICD-10-CM

## 2014-01-01 LAB — CBC WITH DIFFERENTIAL/PLATELET
BASOS ABS: 0 10*3/uL (ref 0.0–0.1)
Basophils Relative: 0.3 % (ref 0.0–3.0)
Eosinophils Absolute: 0.1 10*3/uL (ref 0.0–0.7)
Eosinophils Relative: 1.7 % (ref 0.0–5.0)
HCT: 42.3 % (ref 36.0–46.0)
Hemoglobin: 13.9 g/dL (ref 12.0–15.0)
LYMPHS ABS: 2.1 10*3/uL (ref 0.7–4.0)
Lymphocytes Relative: 31.3 % (ref 12.0–46.0)
MCHC: 32.8 g/dL (ref 30.0–36.0)
MCV: 92.9 fl (ref 78.0–100.0)
MONO ABS: 0.7 10*3/uL (ref 0.1–1.0)
MONOS PCT: 10.7 % (ref 3.0–12.0)
Neutro Abs: 3.7 10*3/uL (ref 1.4–7.7)
Neutrophils Relative %: 56 % (ref 43.0–77.0)
Platelets: 208 10*3/uL (ref 150.0–400.0)
RBC: 4.56 Mil/uL (ref 3.87–5.11)
RDW: 13.7 % (ref 11.5–15.5)
WBC: 6.6 10*3/uL (ref 4.0–10.5)

## 2014-01-01 LAB — BASIC METABOLIC PANEL
BUN: 17 mg/dL (ref 6–23)
CALCIUM: 9 mg/dL (ref 8.4–10.5)
CO2: 28 mEq/L (ref 19–32)
Chloride: 101 mEq/L (ref 96–112)
Creatinine, Ser: 0.6 mg/dL (ref 0.4–1.2)
GFR: 95.03 mL/min (ref >60.00)
Glucose, Bld: 82 mg/dL (ref 70–99)
Potassium: 4.1 mEq/L (ref 3.5–5.1)
SODIUM: 138 meq/L (ref 135–145)

## 2014-01-01 NOTE — Telephone Encounter (Signed)
Spoke with patient and she reports she had vomiting with Augmentin. She called in and Dr. Fuller Plan changed her to Flagyl. She took that and a pain pill she had already. Now she is itching all over and still has abdominal pain. Per Barb Merino, RN, patient scheduled at 2:30  PM today with  Alonza Bogus, PA . Patient aware.

## 2014-01-01 NOTE — Progress Notes (Signed)
01/01/2014 [name] [name] [id number] [birth year]   History of Present Illness:  This is an 80 year old female previously known to Dr. Olevia Perches.  She had a colonoscopy by Dr. Olevia Perches in April 2004 at which time she was found to have only diverticulosis. Last EGD in March 2004 showed gastritis. She was last seen here in 12/06/2013 for an emergency room followup were she was placed on treatment with Augmentin for diverticulitis as seen on a CT scan. She had still been taking the antibiotic at her followup appointment, but her pain was much improved. She then called our office this past Friday, October 9, complaining of recurrent left lower quadrant that awoke her from sleep at 3 AM on Friday. Due to being a weekend she was given a prescription for Augmentin 875 mg BID again to treat her empirically for diverticulitis. Then on Saturday she called our on call MD. with complaints of nausea and vomiting (two episodes of vomiting on Saturday and none since that time) that she felt was a side effect of the Augmentin and was requesting another antibiotic. She was started on Flagyl 500 mg twice daily for 10 days; it was recommended she go to the emergency department if not improved. It was recommended that she have a clear liquid diet and advance as tolerated the following day. A prescription for phenergan was called in as well.  She called our office today complaining of itching and was unsure if it was related to the antibiotics. She has also been taking Dilaudid that she had left over from her knee replacements for her abdominal pain as well.  It is very hard to decipher what may be causing her itching.  She denies any rash.  She took the flagyl alone this AM and does not have as much itching.     Current Medications, Allergies, Past Medical History, Past Surgical History, Family History and Social History were reviewed in [employer] record.   Physical Exam: BP 134/68  Pulse 62  Ht 5'  2" (1.575 m)  Wt 170 lb (77.111 kg)  BMI 31.09 kg/m2 General: Well developed white female in no acute distress; no rash noted. Head: Normocephalic and atraumatic Eyes:  Sclerae anicteric, conjunctiva pink.  Left eye is cloudy.  Ears: Normal auditory acuity Lungs: Clear throughout to auscultation Heart: Regular rate and rhythm Abdomen: Soft, non-distended.  BS present.  Mild LLQ TTP without R/R/G. Musculoskeletal: Symmetrical with no gross deformities  Extremities: No edema  Neurological: Alert oriented x 4, grossly non-focal Psychological:  Alert and cooperative. Normal mood and affect  Assessment and Recommendations: -LLQ abdominal pain:  Recent diverticulitis. Due to the issues she is having with different antibiotics and medications, we will order a CT scan of the abdomen and pelvis with contrast to confirm that she actually still has ongoing or recurrent diverticulitis. We offered her an appointment at 8:30 tomorrow morning, however, her and her daughter declined and opted for appointment tomorrow afternoon at 1:30 PM. In the interim she will continue the Flagyl 500 mg for now in the antibiotic.  Check CBC and BMP today as well. -Itching:  Unsure what medication this is coming from, however, she took the Flagyl alone this morning and had very minimal itching today. She asked if she took the Dilaudid pain medication if needed if she could try taking some Benadryl with it to see if that would help the itching. Advised that that likely be okay especially to try at bedtime to help  her get some sleep.

## 2014-01-01 NOTE — Patient Instructions (Signed)
Your physician has requested that you go to the basement for the following lab work before leaving today: CBC BMET _____________________________________________________________________________________________________________________________________________________________ Dennis Bast have been scheduled for a CT scan of the abdomen and pelvis at [facility] ([address]---this is in the same building as Press [facility]).   You are scheduled on 01-02-2014 at 1:30 pm. You should arrive 15 minutes prior to your appointment time for registration. Please follow the written instructions below on the day of your exam:  WARNING: IF YOU ARE ALLERGIC TO IODINE/X-RAY DYE, PLEASE NOTIFY RADIOLOGY IMMEDIATELY AT [phone]! YOU WILL BE GIVEN A 13 HOUR PREMEDICATION PREP.  1) Do not eat or drink anything after 9:30 am (4 hours prior to your test) 2) You have been given 2 bottles of oral contrast to drink. The solution may taste better if refrigerated, but do NOT add ice or any other liquid to this solution. Shake well before drinking.    Drink 1 bottle of contrast @ 11:30 am (2 hours prior to your exam)  Drink 1 bottle of contrast @ 12:30 pm (1 hour prior to your exam)  You may take any medications as prescribed with a small amount of water except for the following: Metformin, Glucophage, Glucovance, Avandamet, Riomet, Fortamet, Actoplus Met, Janumet, Glumetza or Metaglip. The above medications must be held the day of the exam AND 48 hours after the exam.  The purpose of you drinking the oral contrast is to aid in the visualization of your intestinal tract. The contrast solution may cause some diarrhea. Before your exam is started, you will be given a small amount of fluid to drink. Depending on your individual set of symptoms, you may also receive an intravenous injection of x-ray contrast/dye. Plan on being at [facility] for 30 minutes or long, depending on the type of exam you are having  performed.  This test typically takes 30-45 minutes to complete.     If you have any questions regarding your exam or if you need to reschedule, you may call the CT department at [phone] between the hours of 8:00 am and 5:00 pm, Monday-Friday.  ________________________________________________________________________

## 2014-01-01 NOTE — Progress Notes (Signed)
Reviewed, Dilaudid is more likely to cause pruritus. I agree with CT scan.

## 2014-01-02 ENCOUNTER — Ambulatory Visit (INDEPENDENT_AMBULATORY_CARE_PROVIDER_SITE_OTHER)
Admission: RE | Admit: 2014-01-02 | Discharge: 2014-01-02 | Disposition: A | Discharge status: Home / Self Care | Payer: Medicare Other | Source: Ambulatory Visit | Attending: Gastroenterology | Admitting: Gastroenterology

## 2014-01-02 DIAGNOSIS — R1084 Generalized abdominal pain: Secondary | ICD-10-CM

## 2014-01-02 MED ORDER — IOHEXOL 300 MG/ML  SOLN
100.0000 mL | Freq: Once | INTRAMUSCULAR | Status: AC | PRN
  Administered 2014-01-02: 100 mL via INTRAVENOUS

## 2014-01-03 ENCOUNTER — Ambulatory Visit: Payer: Medicare Other | Admitting: Nurse Practitioner

## 2014-01-03 ENCOUNTER — Encounter: Payer: Self-pay | Admitting: Podiatry

## 2014-01-03 ENCOUNTER — Ambulatory Visit (INDEPENDENT_AMBULATORY_CARE_PROVIDER_SITE_OTHER): Payer: Medicare Other

## 2014-01-03 ENCOUNTER — Ambulatory Visit (INDEPENDENT_AMBULATORY_CARE_PROVIDER_SITE_OTHER): Payer: Medicare Other | Admitting: Podiatry

## 2014-01-03 VITALS — Resp 13

## 2014-01-03 DIAGNOSIS — S92912G Unspecified fracture of left toe(s), subsequent encounter for fracture with delayed healing: Secondary | ICD-10-CM

## 2014-01-03 NOTE — Patient Instructions (Signed)
Wear athletic lace up style shoe on the left foot until the foot is pain free

## 2014-01-03 NOTE — Progress Notes (Signed)
   Subjective:    Patient ID: [name] [name], female    DOB: [birth year], 80 y.o.   MRN: [id number]  HPI Comments: N knot L left sub 2,3rd toes D 3 weeks ago O C painful knot A pressure or soft shoes T none  Foot Pain   this patient presents today for followup care for fracture fourth left metatarsal under care since 10/14/2013. She currently is wearing a surgical shoe. Today she is also complaining of some general discomfort on the plantar left second and third MPJ upon weight-bearing   Review of Systems  All other systems reviewed and are negative.      Objective:   Physical Exam Orientated x3  There is no erythema, edema noted in the left foot. There is palpable tenderness in the fourth left metatarsal shaft without any crepitus The plantar second third left MPJ are relatively plantar flexed to the third left MPJ without any palpable lesions Mild atrophy of plantar fat pad MPJ left  X-ray examination today demonstrates consolidation of oblique fracture fourth metatarsal with minimal dorsal displacement      Assessment & Plan:   Assessment: Healing fourth left metatarsal fracture in satisfactory alignment Some weight transfer from fourth left metatarsal to second and third left metatarsal heads, left  Plan: DC surgical shoe Patient will wear athletic walking or running shoe on left foot Activity to her tolerance If the pain in the fourth left metatarsal fracture site exacerbates wear surgical shoe as needed.  Reappoint at patient's request

## 2014-01-04 ENCOUNTER — Inpatient Hospital Stay (HOSPITAL_COMMUNITY): Payer: Medicare Other

## 2014-01-04 ENCOUNTER — Inpatient Hospital Stay (HOSPITAL_COMMUNITY)
Admission: EM | Admit: 2014-01-04 | Discharge: 2014-01-06 | DRG: 066 | Disposition: A | Discharge status: Home Health | Payer: Medicare Other | Attending: Internal Medicine | Admitting: Internal Medicine

## 2014-01-04 ENCOUNTER — Ambulatory Visit (INDEPENDENT_AMBULATORY_CARE_PROVIDER_SITE_OTHER): Payer: Medicare Other | Admitting: Nurse Practitioner

## 2014-01-04 ENCOUNTER — Encounter: Payer: Self-pay | Admitting: Nurse Practitioner

## 2014-01-04 ENCOUNTER — Encounter (HOSPITAL_COMMUNITY): Payer: Self-pay | Admitting: Emergency Medicine

## 2014-01-04 ENCOUNTER — Emergency Department (HOSPITAL_COMMUNITY): Payer: Medicare Other

## 2014-01-04 VITALS — BP 140/62 | HR 65 | Ht 62.5 in | Wt 167.4 lb

## 2014-01-04 DIAGNOSIS — I639 Cerebral infarction, unspecified: Principal | ICD-10-CM | POA: Diagnosis present

## 2014-01-04 DIAGNOSIS — I509 Heart failure, unspecified: Secondary | ICD-10-CM | POA: Diagnosis present

## 2014-01-04 DIAGNOSIS — Z7982 Long term (current) use of aspirin: Secondary | ICD-10-CM | POA: Diagnosis not present

## 2014-01-04 DIAGNOSIS — I63412 Cerebral infarction due to embolism of left middle cerebral artery: Secondary | ICD-10-CM

## 2014-01-04 DIAGNOSIS — Z96652 Presence of left artificial knee joint: Secondary | ICD-10-CM | POA: Diagnosis present

## 2014-01-04 DIAGNOSIS — Z823 Family history of stroke: Secondary | ICD-10-CM

## 2014-01-04 DIAGNOSIS — R29810 Facial weakness: Secondary | ICD-10-CM | POA: Diagnosis present

## 2014-01-04 DIAGNOSIS — E785 Hyperlipidemia, unspecified: Secondary | ICD-10-CM | POA: Diagnosis present

## 2014-01-04 DIAGNOSIS — I2510 Atherosclerotic heart disease of native coronary artery without angina pectoris: Secondary | ICD-10-CM | POA: Diagnosis present

## 2014-01-04 DIAGNOSIS — M1990 Unspecified osteoarthritis, unspecified site: Secondary | ICD-10-CM | POA: Diagnosis present

## 2014-01-04 DIAGNOSIS — I10 Essential (primary) hypertension: Secondary | ICD-10-CM

## 2014-01-04 DIAGNOSIS — Z96651 Presence of right artificial knee joint: Secondary | ICD-10-CM | POA: Diagnosis present

## 2014-01-04 DIAGNOSIS — R471 Dysarthria and anarthria: Secondary | ICD-10-CM | POA: Diagnosis present

## 2014-01-04 DIAGNOSIS — R4701 Aphasia: Secondary | ICD-10-CM | POA: Diagnosis present

## 2014-01-04 DIAGNOSIS — H5442 Blindness, left eye, normal vision right eye: Secondary | ICD-10-CM | POA: Diagnosis present

## 2014-01-04 DIAGNOSIS — R002 Palpitations: Secondary | ICD-10-CM

## 2014-01-04 DIAGNOSIS — R531 Weakness: Secondary | ICD-10-CM

## 2014-01-04 DIAGNOSIS — R202 Paresthesia of skin: Secondary | ICD-10-CM

## 2014-01-04 DIAGNOSIS — I471 Supraventricular tachycardia: Secondary | ICD-10-CM

## 2014-01-04 LAB — BASIC METABOLIC PANEL
Anion gap: 13 (ref 5–15)
BUN: 15 mg/dL (ref 6–23)
CALCIUM: 9.4 mg/dL (ref 8.4–10.5)
CO2: 27 meq/L (ref 19–32)
CREATININE: 0.6 mg/dL (ref 0.50–1.10)
Chloride: 100 mEq/L (ref 96–112)
GFR calc non Af Amer: 80 mL/min — ABNORMAL LOW (ref >90)
Glucose, Bld: 116 mg/dL — ABNORMAL HIGH (ref 70–99)
Potassium: 4.3 mEq/L (ref 3.7–5.3)
Sodium: 140 mEq/L (ref 137–147)

## 2014-01-04 LAB — URINALYSIS, ROUTINE W REFLEX MICROSCOPIC
Bilirubin Urine: NEGATIVE
Glucose, UA: NEGATIVE mg/dL
Hgb urine dipstick: NEGATIVE
Ketones, ur: NEGATIVE mg/dL
NITRITE: NEGATIVE
PROTEIN: NEGATIVE mg/dL
Specific Gravity, Urine: 1.006 (ref 1.005–1.030)
UROBILINOGEN UA: 0.2 mg/dL (ref 0.0–1.0)
pH: 7.5 (ref 5.0–8.0)

## 2014-01-04 LAB — PROTIME-INR
INR: 1.02 (ref 0.00–1.49)
Prothrombin Time: 13.5 seconds (ref 11.6–15.2)

## 2014-01-04 LAB — URINE MICROSCOPIC-ADD ON

## 2014-01-04 LAB — I-STAT CHEM 8, ED
BUN: 17 mg/dL (ref 6–23)
CHLORIDE: 100 meq/L (ref 96–112)
Calcium, Ion: 1.14 mmol/L (ref 1.13–1.30)
Creatinine, Ser: 0.7 mg/dL (ref 0.50–1.10)
Glucose, Bld: 117 mg/dL — ABNORMAL HIGH (ref 70–99)
HEMATOCRIT: 46 % (ref 36.0–46.0)
Hemoglobin: 15.6 g/dL — ABNORMAL HIGH (ref 12.0–15.0)
Potassium: 4.1 mEq/L (ref 3.7–5.3)
Sodium: 138 mEq/L (ref 137–147)
TCO2: 28 mmol/L (ref 0–100)

## 2014-01-04 LAB — TROPONIN I: Troponin I: <0.3 ng/mL (ref <0.30)

## 2014-01-04 LAB — CBC
HCT: 44 % (ref 36.0–46.0)
Hemoglobin: 14.6 g/dL (ref 12.0–15.0)
MCH: 30.9 pg (ref 26.0–34.0)
MCHC: 33.2 g/dL (ref 30.0–36.0)
MCV: 93.2 fL (ref 78.0–100.0)
PLATELETS: 191 10*3/uL (ref 150–400)
RBC: 4.72 MIL/uL (ref 3.87–5.11)
RDW: 13.1 % (ref 11.5–15.5)
WBC: 5.4 10*3/uL (ref 4.0–10.5)

## 2014-01-04 LAB — I-STAT TROPONIN, ED: TROPONIN I, POC: 0.01 ng/mL (ref 0.00–0.08)

## 2014-01-04 LAB — APTT: APTT: 30 s (ref 24–37)

## 2014-01-04 MED ORDER — HYDRALAZINE HCL 20 MG/ML IJ SOLN: 10.0000 mg | Freq: Four times a day (QID) | INTRAMUSCULAR | Status: DC | PRN

## 2014-01-04 MED ORDER — STROKE: EARLY STAGES OF RECOVERY BOOK
Freq: Once | Status: AC
  Administered 2014-01-04
  Filled 2014-01-04: qty 1

## 2014-01-04 MED ORDER — ONDANSETRON HCL 4 MG/2ML IJ SOLN: 4.0000 mg | Freq: Four times a day (QID) | INTRAMUSCULAR | Status: DC | PRN

## 2014-01-04 MED ORDER — ENOXAPARIN SODIUM 40 MG/0.4ML ~~LOC~~ SOLN
40.0000 mg | SUBCUTANEOUS | Status: DC
  Administered 2014-01-04 – 2014-01-05 (×2): 40 mg via SUBCUTANEOUS
  Filled 2014-01-04 (×3): qty 0.4

## 2014-01-04 MED ORDER — ASPIRIN 325 MG PO TABS
325.0000 mg | ORAL_TABLET | Freq: Every day | ORAL | Status: DC
  Administered 2014-01-04 – 2014-01-06 (×3): 325 mg via ORAL
  Filled 2014-01-04 (×4): qty 1

## 2014-01-04 MED ORDER — SODIUM CHLORIDE 0.9 % IV SOLN
INTRAVENOUS | Status: DC
  Administered 2014-01-04 (×2): via INTRAVENOUS

## 2014-01-04 MED ORDER — PREDNISOLONE ACETATE 1 % OP SUSP
1.0000 [drp] | Freq: Two times a day (BID) | OPHTHALMIC | Status: DC
  Administered 2014-01-04 – 2014-01-06 (×4): 1 [drp] via OPHTHALMIC
  Filled 2014-01-04: qty 1

## 2014-01-04 NOTE — ED Notes (Signed)
q30 minute neuro, q15 min vitals until 1630.

## 2014-01-04 NOTE — ED Provider Notes (Signed)
CSN: [id number]     Arrival date & time 01/04/14  1407 History   First MD Initiated Contact with Patient 01/04/14 1430     Chief Complaint  Patient presents with  . Code Stroke    Patient is a 80 y.o. female presenting with neurologic complaint. The history is provided by the patient, a relative and the EMS personnel.  Neurologic Problem This is a new problem. Episode onset: Around 1:30 today. The problem occurs constantly. The problem has been gradually improving. Associated symptoms include numbness and weakness. Pertinent negatives include no abdominal pain, chest pain, fever, headaches, nausea, neck pain, rash, sore throat or vomiting. Associated symptoms comments: Speech difficulty, right arm numbness/weakness. Nothing aggravates the symptoms. She has tried nothing for the symptoms. The treatment provided no relief.   80 year old female presents with right arm weakness and slurred speech and right facial droop. Symptom onset was around 1:30. She was last seen normal by her daughter at 1 PM. Patient is alert and oriented x4. She believes her symptoms are improving. Daughter states that the patient called her and it sounded like she had food in her mouth. Patient also stated that she drank apple juice and medication out of her mouth. She also felt her right arm was heavy.  Past Medical History  Diagnosis Date  . Arthritis   . Blind left eye     Corneal transplant x3 with rejection  . Dysrhythmia   . Varicose veins     both legs and left fooot at arch  . Complication of anesthesia     sensitive to meds, bp can fall  . SVT (supraventricular tachycardia) 12/15/2011    seen by Dr. Caryl Comes  . Helicobacter pylori (H. pylori) 05/22/02    RUT-Positive  . Gastritis   . Diverticulosis    Past Surgical History  Procedure Laterality Date  . Cholecystectomy    . Tonsillectomy    . Tonsillectomy    . Corneal transplant  right 2009, left 2009    both eyes, 3 in left eye, 1 in right eye  .  Cataract surgery  2005    both eyes  . Abdominal hysterectomy   sept 1985    ovaries removed  . Total knee arthroplasty Left 06/27/2012    Procedure: LEFT TOTAL KNEE ARTHROPLASTY;  Surgeon: Gearlean Alf, MD;  Location: [facility];  Service: Orthopedics;  Laterality: Left;  . Tubal ligation    . Total knee arthroplasty Right 12/12/2012    Procedure: RIGHT TOTAL KNEE ARTHROPLASTY;  Surgeon: Gearlean Alf, MD;  Location: [facility];  Service: Orthopedics;  Laterality: Right;   Family History  Problem Relation Age of Onset  . Stroke Mother   . Heart attack Father   . Heart attack Brother   . Cancer Brother    History  Substance Use Topics  . Smoking status: Never Smoker   . Smokeless tobacco: Never Used  . Alcohol Use: No   OB History   Grav Para Term Preterm Abortions TAB SAB Ect Mult Living                 Review of Systems  Constitutional: Negative for fever.  HENT: Negative for rhinorrhea and sore throat.   Eyes: Negative for visual disturbance.  Respiratory: Negative for chest tightness and shortness of breath.   Cardiovascular: Negative for chest pain and palpitations.  Gastrointestinal: Negative for nausea, vomiting, abdominal pain and constipation.  Genitourinary: Negative for dysuria and hematuria.  Musculoskeletal:  Negative for back pain and neck pain.  Skin: Negative for rash.  Neurological: Positive for speech difficulty, weakness and numbness. Negative for dizziness and headaches.  Psychiatric/Behavioral: Negative for confusion.  All other systems reviewed and are negative.  Allergies  Ultram; Buprenex; Levofloxacin; Percocet; Sulfa antibiotics; Tape; and Zetia  Home Medications   Prior to Admission medications   Medication Sig Start Date End Date Taking? Authorizing Provider  aspirin EC 81 MG tablet Take 81 mg by mouth daily.    Historical Provider, MD  HYDROmorphone (DILAUDID) 2 MG tablet Take 2 mg by mouth every 8 (eight) hours as needed for severe pain.     Historical Provider, MD  Melatonin 1 MG SUBL Place 1 mg under the tongue at bedtime.    Historical Provider, MD  Multiple Vitamin (MULTI VITAMIN DAILY PO) Take 1 tablet by mouth every morning.    Historical Provider, MD  nebivolol (BYSTOLIC) 5 MG tablet Take 1 tablet (5 mg total) by mouth 2 (two) times daily. 12/05/13   [name], MD  prednisoLONE acetate (PRED FORTE) 1 % ophthalmic suspension Place 1 drop into both eyes 2 (two) times daily.     Historical Provider, MD  promethazine (PHENERGAN) 12.5 MG tablet Take 1 tablet (12.5 mg total) by mouth every 6 (six) hours as needed for nausea or vomiting. 12/30/13   Ladene Artist, MD   BP 156/71  Pulse 74  Temp(Src) 98.4 F (36.9 C) (Oral)  Resp 14  SpO2 99% Physical Exam  Constitutional: She is oriented to person, place, and time. She appears well-developed and well-nourished. No distress.  HENT:  Head: Normocephalic and atraumatic.  Mouth/Throat: Oropharynx is clear and moist.  Eyes: EOM are normal. Pupils are equal, round, and reactive to light.  Neck: Neck supple. No JVD present.  Cardiovascular: Normal rate, regular rhythm, normal heart sounds and intact distal pulses.  Exam reveals no gallop.   No murmur heard. Pulmonary/Chest: Effort normal and breath sounds normal. She has no wheezes. She has no rales.  Abdominal: Soft. She exhibits no distension. There is no tenderness.  Musculoskeletal: Normal range of motion. She exhibits no tenderness.  Neurological: She is alert and oriented to person, place, and time. No sensory deficit. She exhibits normal muscle tone. GCS eye subscore is 4. GCS verbal subscore is 5. GCS motor subscore is 6. She displays no Babinski's sign on the right side. She displays no Babinski's sign on the left side.  Reflex Scores:      Tricep reflexes are 2+ on the right side and 2+ on the left side.      Bicep reflexes are 2+ on the right side and 2+ on the left side.      Brachioradialis reflexes are 2+ on the  right side and 2+ on the left side.      Patellar reflexes are 2+ on the right side and 2+ on the left side.      Achilles reflexes are 2+ on the right side and 2+ on the left side. Right facial droop at rest  Skin: Skin is warm and dry. No rash noted.  Psychiatric: Her behavior is normal.    ED Course  Procedures  None   Labs Review Labs Reviewed  BASIC METABOLIC PANEL - Abnormal; Notable for the following:    Glucose, Bld 116 (*)    GFR calc non Af Amer 80 (*)    All other components within normal limits  URINALYSIS, ROUTINE W REFLEX MICROSCOPIC - Abnormal;  Notable for the following:    Leukocytes, UA TRACE (*)    All other components within normal limits  I-STAT CHEM 8, ED - Abnormal; Notable for the following:    Glucose, Bld 117 (*)    Hemoglobin 15.6 (*)    All other components within normal limits  CBC  PROTIME-INR  TROPONIN I  APTT  URINE MICROSCOPIC-ADD ON  I-STAT TROPOININ, ED    Imaging Review Ct Head Wo Contrast  01/04/2014   CLINICAL DATA:  Right-sided facial droop, slurred speech.  EXAM: CT HEAD WITHOUT CONTRAST  TECHNIQUE: Contiguous axial images were obtained from the base of the skull through the vertex without intravenous contrast.  COMPARISON:  CT scan of October 12, 2007.  FINDINGS: Bony calvarium appears intact. Mild diffuse cortical atrophy is noted. Mild chronic ischemic white matter disease is noted. No mass effect or midline shift is noted. Ventricular size is within normal limits. There is no evidence of mass lesion, hemorrhage or acute infarction.  IMPRESSION: Mild diffuse cortical atrophy. Mild chronic ischemic white matter disease. No acute intracranial abnormality seen. Critical Value/emergent results were called by telephone at the time of interpretation on 01/04/2014 at 2:38 pm to Dr. Noemi Chapel, who verbally acknowledged these results.   Electronically Signed   By: Sabino Dick M.D.   On: 01/04/2014 14:39     EKG Interpretation   Date/Time:   Thursday January 04 2014 14:37:52 EDT Ventricular Rate:  74 PR Interval:  219 QRS Duration: 91 QT Interval:  409 QTC Calculation: 454 R Axis:   76 Text Interpretation:  Sinus rhythm Borderline prolonged PR interval Normal  ECG since last tracing no significant change Confirmed by MILLER  MD,  BRIAN ([id number]) on 01/04/2014 3:38:42 PM      MDM   Final diagnoses:  Cerebral infarction due to unspecified mechanism    80 yo female presents with right arm weakness, slurred speech and right facial droop. Last normal at 1 PM. Speech is improving. Sensation and grip strength symmetric bilaterally. Neurology at bedside. Airway intact. Patient to CT. CT negative. Neurology assesses that the patient is likely having a mild left CVA. No lytics given secondary to minimal symptoms. Patient will be admitted to the hospitalist service for continued stroke management. Patient is a hemodynamically stable for transport to the floor.  Plan discuss with Dr. Sabra Heck.   Gustavus Bryant, MD 01/04/14 [phone]

## 2014-01-04 NOTE — Code Documentation (Signed)
80yo female arriving to [facility] via private vehicle at 1407.  Patient reports that she was at home eating lunch when she had sudden onset right arm numbness and weakness that lasted about 5 minutes.  She had difficulty raising her arm.  She then noticed while eating an apple that the juice was running out of the side of her mouth. She called her daughter who reports that her speech was slurred.  Patient's daughter drove her to the hospital.  Code stroke called in ED Triage.  Patient taken to CT.  Stroke Team at bedside.  Patient's symptoms have improved with only mild facial droop remaining, initial NIHSS 1.  Patient back to the room.  Dr. Leonel Ramsay to the bedside and patient now NIHSS 3 with right arm drift, mild dysarthria and facial droop.  Patient is too mild to treat with tPA per Dr. Leonel Ramsay.  Patient declined the PRISMS research study.  Patient remains in the window to treat with tPA until 1630 should symptoms worsen.  Bedside handoff with ED RN Margreta Journey.

## 2014-01-04 NOTE — ED Notes (Signed)
Pt reports eating lunch, at 1330 experienced right arm numbness/heaviness lasting appx 5 minutes. Pt reports at that time her food started falling out of the right side of her mouth and her speech started to "sounded weird." At this time, pt noted to have right sided facial droop and per family speech is not at baseline. Pt is AO x 4.

## 2014-01-04 NOTE — ED Provider Notes (Signed)
MSE was initiated and I personally evaluated the patient and placed orders (if any) at  2:25 PM on January 04, 2014.  The patient appears stable so that the remainder of the MSE may be completed by another provider.  [name] [name] is a 80 y.o. female here with R facial droop and slurred speech acute onset around 1:30 PM today. As per daughter speech is still slurred. On exam, mild R facial droop. Obvious slurred speech. Code stroke activated and orders placed by nurse.    Wandra Arthurs, MD 01/04/14 [phone]

## 2014-01-04 NOTE — Progress Notes (Signed)
[name] [name] Date of Birth: January 24, 1925 Medical Record #[id number]  History of Present Illness: Ms. Cambre is seen back today for a one month check - seen for Dr. Burt Knack. She is an 80 year old female with labile HTN and SVT. Echo with normal EF of 55 to 60% with grade II diastolic dysfunction, mild MR and moderate TR. Myoview with normal perfusion back in 2014.  Seen a month ago - had had a bout of diverticulitis - associated with a near syncopal spell when going from sitting to standing - BP quite high and felt to be very sensitive to physical or emotional stress. Bystolic was increased and low dose Norvasc was added. She had had only one bout of SVT - beta blocker continued but could consider seeing Dr. Caryl Comes again if had increased frequency.  Comes back today. Here with her youngest daughter. She is doing ok from our standpoint. Has had one episode of palpitations - not really interested in changing her treatment plan for her palpitations. She continues to have lower left quadrant belly pain - now off of antibiotics and had a CT yesterday - this was reviewed - no acute diverticulitis but had a moderate amount of stool - has already been instructed to use Mag Citrate. She feels quite nauseated and has been vomiting. She says her bowels have been moving normally. No diarrhea. No fever. Has a boot on the left foot - "stepped in a rut and has a fracture". BP readings from home reviewed - for the most part her readings are ok - she did take an extra Bystolic one day because of vomiting.    Current Outpatient Prescriptions  Medication Sig Dispense Refill  . aspirin EC 81 MG tablet Take 81 mg by mouth daily.      Marland Kitchen HYDROmorphone (DILAUDID) 2 MG tablet Take 2 mg by mouth every 8 (eight) hours as needed for severe pain.      . Melatonin 1 MG SUBL Place 1 mg under the tongue at bedtime.      . Multiple Vitamin (MULTI VITAMIN DAILY PO) Take 1 tablet by mouth every morning.      . nebivolol (BYSTOLIC) 5 MG  tablet Take 1 tablet (5 mg total) by mouth 2 (two) times daily.  60 tablet  6  . prednisoLONE acetate (PRED FORTE) 1 % ophthalmic suspension Place 1 drop into both eyes 2 (two) times daily.       . promethazine (PHENERGAN) 12.5 MG tablet Take 1 tablet (12.5 mg total) by mouth every 6 (six) hours as needed for nausea or vomiting.  20 tablet  0   No current facility-administered medications for this visit.    Allergies  Allergen Reactions  . Ultram [Tramadol] Hives  . Buprenex [Buprenorphine Hcl] Other (See Comments)    Heart raced  . Levofloxacin Other (See Comments)    levaquin unknown reaction  . Percocet [Oxycodone-Acetaminophen] Itching    "felt drained"  . Sulfa Antibiotics Other (See Comments)    unknown  . Tape     Adhesive tape causes skin to pull off  . Zetia [Ezetimibe] Other (See Comments)    unknown    Past Medical History  Diagnosis Date  . Arthritis   . Blind left eye     Corneal transplant x3 with rejection  . Dysrhythmia   . Varicose veins     both legs and left fooot at arch  . Complication of anesthesia     sensitive to meds, bp  can fall  . SVT (supraventricular tachycardia) 12/15/2011    seen by Dr. Caryl Comes  . Helicobacter pylori (H. pylori) 05/22/02    RUT-Positive  . Gastritis   . Diverticulosis     Past Surgical History  Procedure Laterality Date  . Cholecystectomy    . Tonsillectomy    . Tonsillectomy    . Corneal transplant  right 2009, left 2009    both eyes, 3 in left eye, 1 in right eye  . Cataract surgery  2005    both eyes  . Abdominal hysterectomy   sept 1985    ovaries removed  . Total knee arthroplasty Left 06/27/2012    Procedure: LEFT TOTAL KNEE ARTHROPLASTY;  Surgeon: Gearlean Alf, MD;  Location: [facility];  Service: Orthopedics;  Laterality: Left;  . Tubal ligation    . Total knee arthroplasty Right 12/12/2012    Procedure: RIGHT TOTAL KNEE ARTHROPLASTY;  Surgeon: Gearlean Alf, MD;  Location: [facility];  Service: Orthopedics;   Laterality: Right;    History  Smoking status  . Never Smoker   Smokeless tobacco  . Never Used    History  Alcohol Use No    Family History  Problem Relation Age of Onset  . Stroke Mother   . Heart attack Father   . Heart attack Brother   . Cancer Brother     Review of Systems: The review of systems is per the HPI.  All other systems were reviewed and are negative.  Physical Exam: BP 140/62  Pulse 65  Ht 5' 2.5" (1.588 m)  Wt 167 lb 6.4 oz (75.932 kg)  BMI 30.11 kg/m2  SpO2 98% Patient is very pleasant and in no acute distress. Skin is warm and dry. Color is normal.  HEENT is unremarkable. Normocephalic/atraumatic. PERRL. Sclera are nonicteric. Neck is supple. No masses. No JVD. Lungs are clear. Cardiac exam shows a regular rate and rhythm. Abdomen is soft. Extremities are without edema. Gait and ROM are intact. No gross neurologic deficits noted.  Wt Readings from Last 3 Encounters:  01/04/14 167 lb 6.4 oz (75.932 kg)  01/01/14 170 lb (77.111 kg)  12/06/13 169 lb (76.658 kg)    LABORATORY DATA/PROCEDURES:  Lab Results  Component Value Date   WBC 6.6 01/01/2014   HGB 13.9 01/01/2014   HCT 42.3 01/01/2014   PLT 208.0 01/01/2014   GLUCOSE 82 01/01/2014   ALT 23 12/29/2012   AST 23 12/29/2012   NA 138 01/01/2014   K 4.1 01/01/2014   CL 101 01/01/2014   CREATININE 0.6 01/01/2014   BUN 17 01/01/2014   CO2 28 01/01/2014   INR 0.99 12/05/2012    BNP (last 3 results) No results found for this basename: PROBNP,  in the last 8760 hours   Assessment / Plan: 1. Labile HTN - improved readings. Recheck by me is 130/80 - did not tolerate the Norvasc - I have left her on her current regimen - she will continue to monitor at home. See back as planned  2. PSVT - one recurrence over the past month - she does not wish to change her current regimen.   3. Diverticulitis - resolved by recent CT but with a moderate amount of stool in the colon - she is planning on using the  mag citrate as already directed.   Patient is agreeable to this plan and will call if any problems develop in the interim.   Burtis Junes, RN, [place]  [address], [place]  [address] [phone]

## 2014-01-04 NOTE — H&P (Signed)
[facility] History and Physical  [name] [name] WEX:[id number] DOB: 11-24-1924 DOA: 01/04/2014  Referring physician:  PCP: Marjorie Smolder, MD  Specialists:   Chief Complaint: R arm numbness, slurred speech   HPI: [name] [name] is a 80 y.o. female with PMH of HTN, DJD, Recent diverticulitis presented  As code stroke after she noticed a sudden onset of right arm weakness, decreased sensation and inability to keep food in the right side of her mouth. On initial arrival patient had a right facial droop, dysarthria but no arm weakness. Pt was evaluated by neurology, TPA was not administered due to minimal symptoms  -Pt denies chest pain, no SOB, no nausea, vomiting or diarrhea, no fever    Review of Systems: The patient denies anorexia, fever, weight loss,, vision loss, decreased hearing, hoarseness, chest pain, syncope, dyspnea on exertion, peripheral edema, balance deficits, hemoptysis, abdominal pain, melena, hematochezia, severe indigestion/heartburn, hematuria, incontinence, genital sores, muscle weakness, suspicious skin lesions, transient blindness, difficulty walking, depression, unusual weight change, abnormal bleeding, enlarged lymph nodes, angioedema, and breast masses.    Past Medical History  Diagnosis Date  . Arthritis   . Blind left eye     Corneal transplant x3 with rejection  . Dysrhythmia   . Varicose veins     both legs and left fooot at arch  . Complication of anesthesia     sensitive to meds, bp can fall  . SVT (supraventricular tachycardia) 12/15/2011    seen by Dr. Caryl Comes  . Helicobacter pylori (H. pylori) 05/22/02    RUT-Positive  . Gastritis   . Diverticulosis    Past Surgical History  Procedure Laterality Date  . Cholecystectomy    . Tonsillectomy    . Tonsillectomy    . Corneal transplant  right 2009, left 2009    both eyes, 3 in left eye, 1 in right eye  . Cataract surgery  2005    both eyes  . Abdominal hysterectomy   sept 1985    ovaries removed   . Total knee arthroplasty Left 06/27/2012    Procedure: LEFT TOTAL KNEE ARTHROPLASTY;  Surgeon: Gearlean Alf, MD;  Location: [facility];  Service: Orthopedics;  Laterality: Left;  . Tubal ligation    . Total knee arthroplasty Right 12/12/2012    Procedure: RIGHT TOTAL KNEE ARTHROPLASTY;  Surgeon: Gearlean Alf, MD;  Location: [facility];  Service: Orthopedics;  Laterality: Right;   Social History:  reports that she has never smoked. She has never used smokeless tobacco. She reports that she does not drink alcohol or use illicit drugs. Home;  where does patient live--home, ALF, SNF? and with whom if at home? Yes;  Can patient participate in ADLs?  Allergies  Allergen Reactions  . Ultram [Tramadol] Hives  . Buprenex [Buprenorphine Hcl] Other (See Comments)    Heart raced  . Levofloxacin Other (See Comments)    levaquin unknown reaction  . Percocet [Oxycodone-Acetaminophen] Itching    "felt drained"  . Sulfa Antibiotics Other (See Comments)    unknown  . Tape     Adhesive tape causes skin to pull off  . Zetia [Ezetimibe] Other (See Comments)    unknown    Family History  Problem Relation Age of Onset  . Stroke Mother   . Heart attack Father   . Heart attack Brother   . Cancer Brother     (be sure to complete)  Prior to Admission medications   Medication Sig Start Date End Date Taking? Authorizing Provider  aspirin EC 81 MG tablet Take 81 mg by mouth daily.    Historical Provider, MD  HYDROmorphone (DILAUDID) 2 MG tablet Take 2 mg by mouth every 8 (eight) hours as needed for severe pain.    Historical Provider, MD  Melatonin 1 MG SUBL Place 1 mg under the tongue at bedtime.    Historical Provider, MD  Multiple Vitamin (MULTI VITAMIN DAILY PO) Take 1 tablet by mouth every morning.    Historical Provider, MD  nebivolol (BYSTOLIC) 5 MG tablet Take 1 tablet (5 mg total) by mouth 2 (two) times daily. 12/05/13   [name], MD  prednisoLONE acetate (PRED FORTE) 1 % ophthalmic  suspension Place 1 drop into both eyes 2 (two) times daily.     Historical Provider, MD  promethazine (PHENERGAN) 12.5 MG tablet Take 1 tablet (12.5 mg total) by mouth every 6 (six) hours as needed for nausea or vomiting. 12/30/13   Ladene Artist, MD   Physical Exam: Filed Vitals:   01/04/14 1515  BP: 158/70  Pulse: 74  Temp:   Resp: 22     General:  alert  Eyes: eom-i  ENT: no oral ulcers   Neck: supple, no JVD  Cardiovascular: s1,s2 rrr  Respiratory: CTA BL  Abdomen: soft, nt,nd   Skin: no rash   Musculoskeletal: no LE edema  Psychiatric: no hallucinations   Neurologic: mild L sided  Facial droop, otherwise CN 2-12 intact; motor, sensation is intact   Labs on Admission:  Basic Metabolic Panel:  Recent Labs Lab 01/01/14 1518 01/04/14 1419 01/04/14 1431  NA 138 140 138  K 4.1 4.3 4.1  CL 101 100 100  CO2 28 27  --   GLUCOSE 82 116* 117*  BUN 17 15 17   CREATININE 0.6 0.60 0.70  CALCIUM 9.0 9.4  --    Liver Function Tests: No results found for this basename: AST, ALT, ALKPHOS, BILITOT, PROT, ALBUMIN,  in the last 168 hours No results found for this basename: LIPASE, AMYLASE,  in the last 168 hours No results found for this basename: AMMONIA,  in the last 168 hours CBC:  Recent Labs Lab 01/01/14 1518 01/04/14 1419 01/04/14 1431  WBC 6.6 5.4  --   NEUTROABS 3.7  --   --   HGB 13.9 14.6 15.6*  HCT 42.3 44.0 46.0  MCV 92.9 93.2  --   PLT 208.0 191  --    Cardiac Enzymes:  Recent Labs Lab 01/04/14 1419  TROPONINI <0.30    BNP (last 3 results) No results found for this basename: PROBNP,  in the last 8760 hours CBG: No results found for this basename: GLUCAP,  in the last 168 hours  Radiological Exams on Admission: Ct Head Wo Contrast  01/04/2014   CLINICAL DATA:  Right-sided facial droop, slurred speech.  EXAM: CT HEAD WITHOUT CONTRAST  TECHNIQUE: Contiguous axial images were obtained from the base of the skull through the vertex without  intravenous contrast.  COMPARISON:  CT scan of October 12, 2007.  FINDINGS: Bony calvarium appears intact. Mild diffuse cortical atrophy is noted. Mild chronic ischemic white matter disease is noted. No mass effect or midline shift is noted. Ventricular size is within normal limits. There is no evidence of mass lesion, hemorrhage or acute infarction.  IMPRESSION: Mild diffuse cortical atrophy. Mild chronic ischemic white matter disease. No acute intracranial abnormality seen. Critical Value/emergent results were called by telephone at the time of interpretation on 01/04/2014 at 2:38 pm to Dr. Aaron Edelman  Sabra Heck, who verbally acknowledged these results.   Electronically Signed   By: Sabino Dick M.D.   On: 01/04/2014 14:39    EKG: Independently reviewed.   Assessment/Plan Active Problems:   Aphasia   Paresthesia   80 y.o. female with PMH of HTN, DJD, Recent diverticulitis presented  as code stroke after she developed R sided pasthesia, aphasia around 1.00 pm -TPA was not administered due to minimal symptoms   1. Suspected acute CVA; symptoms resolving; CT: no acute findings;  -obtain CVA work up, cont ASA, tele monitor; neuro checks   2. HTN, permissive hypertension; prn hydrazine; resume BB in AM  3. Recent Diverticulitis; Pt reports completing treatment with Po atx; repeat CT (10/13): No findings for acute diverticulitis -cont outpatient f/u; colonoscopy in 3-4 week s   Pend: f/u UA    Neurology; if consultant consulted, please document name and whether formally or informally consulted  Code Status:  full (must indicate code status--if unknown or must be presumed, indicate so) Family Communication:  D/w patient, children at the bedside  (indicate person spoken with, if applicable, with phone number if by telephone) Disposition Plan: pend PT (indicate anticipated LOS)  Time spent: >35 minutes   [place], [facility] Pager [phone]  If 7PM-7AM, please contact  night-coverage [email or web address] Password [email or web address] 01/04/2014, 3:29 PM

## 2014-01-04 NOTE — Patient Instructions (Signed)
Stay on your current medicines  Pick up a bottle of mag citrate as directed  See Dr. Burt Knack in January  Call the [place] office at [phone] if you have any questions, problems or concerns.

## 2014-01-04 NOTE — ED Provider Notes (Signed)
Pt is 88, had acute onset of difficulty speaking and dificulty walking due to R sided weakness just pta - LSN at 1 PM - was sent to ED when daughter saw her at home with slurred speech and R sided weakness.  On my exam hs slight R sided weakness but is improving, has speech which is rapidly improving but still slightly slowed. No bruit, soft abd, no afib.   EKG Interpretation  Date/Time:  Thursday January 04 2014 14:37:52 EDT Ventricular Rate:  74 PR Interval:  219 QRS Duration: 91 QT Interval:  409 QTC Calculation: 885 R Axis:   76 Text Interpretation:  Sinus rhythm Borderline prolonged PR interval Normal ECG since last tracing no significant change Confirmed by Nathanael Krist  MD, Friedrich Harriott ([id number]) on 01/04/2014 3:38:42 PM      D/w Neuro hospitalist who agrees pt not candidate for lytics, can be admitted to medicine.  Filed Vitals:   01/04/14 1446 01/04/14 1501 01/04/14 1515 01/04/14 1530  BP: 164/75  158/70 156/71  Pulse: 76 88 74 74  Temp:      TempSrc:      Resp: 19 19 22 14   SpO2: 99% 99% 100% 99%   I saw and evaluated the patient, reviewed the resident's note and I agree with the findings and plan.   Final diagnoses:  Cerebral infarction due to unspecified mechanism         Johnna Acosta, MD 01/04/14 2031

## 2014-01-04 NOTE — ED Provider Notes (Signed)
I saw and evaluated the patient, reviewed the resident's note and I agree with the findings and plan.  Please see my separate note regarding my evaluation of the patient.    Johnna Acosta, MD 01/04/14 2031

## 2014-01-04 NOTE — Consult Note (Signed)
Referring Physician: Sabra Heck    Chief Complaint: code stroke  HPI:                                                                                                                                         [name] [name] is an 80 y.o. female presenting to ED after she noticed a sudden onset of right arm weakness, decreased sensation and inability to keep food in the right side of her mouth. The above symptoms started at 1330 while eating lunch.  Patient called daughter who also noted dysarthria on the phone.  She was brought to ED by daughter,  She walked into ED triage at which point a code stroke was called. On initial arrival patient had a right facial droop, dysarthria but no arm weakness.  While examining patient her right facial weakness tended to wax and wane.  tPA was not offered due to minimal symptoms.  Patient was offered to be in study for minimal symptoms but declined.   Date last known well: Date: 01/04/2014 Time last known well: Time: 13:30 tPA Given: No: minimal symptoms and NIHSS 3  Past Medical History  Diagnosis Date  . Arthritis   . Blind left eye     Corneal transplant x3 with rejection  . Dysrhythmia   . Varicose veins     both legs and left fooot at arch  . Complication of anesthesia     sensitive to meds, bp can fall  . SVT (supraventricular tachycardia) 12/15/2011    seen by Dr. Caryl Comes  . Helicobacter pylori (H. pylori) 05/22/02    RUT-Positive  . Gastritis   . Diverticulosis     Past Surgical History  Procedure Laterality Date  . Cholecystectomy    . Tonsillectomy    . Tonsillectomy    . Corneal transplant  right 2009, left 2009    both eyes, 3 in left eye, 1 in right eye  . Cataract surgery  2005    both eyes  . Abdominal hysterectomy   sept 1985    ovaries removed  . Total knee arthroplasty Left 06/27/2012    Procedure: LEFT TOTAL KNEE ARTHROPLASTY;  Surgeon: Gearlean Alf, MD;  Location: [facility];  Service: Orthopedics;  Laterality: Left;  . Tubal ligation     . Total knee arthroplasty Right 12/12/2012    Procedure: RIGHT TOTAL KNEE ARTHROPLASTY;  Surgeon: Gearlean Alf, MD;  Location: [facility];  Service: Orthopedics;  Laterality: Right;    Family History  Problem Relation Age of Onset  . Stroke Mother   . Heart attack Father   . Heart attack Brother   . Cancer Brother    Social History:  reports that she has never smoked. She has never used smokeless tobacco. She reports that she does not drink alcohol or use illicit drugs.  Allergies:  Allergies  Allergen Reactions  . Ultram [Tramadol] Hives  .  Buprenex [Buprenorphine Hcl] Other (See Comments)    Heart raced  . Levofloxacin Other (See Comments)    levaquin unknown reaction  . Percocet [Oxycodone-Acetaminophen] Itching    "felt drained"  . Sulfa Antibiotics Other (See Comments)    unknown  . Tape     Adhesive tape causes skin to pull off  . Zetia [Ezetimibe] Other (See Comments)    unknown    Medications:                                                                                                                           Current Facility-Administered Medications  Medication Dose Route Frequency Provider Last Rate Last Dose  . 0.9 %  sodium chloride infusion   Intravenous Continuous Johnna Acosta, MD       Current Outpatient Prescriptions  Medication Sig Dispense Refill  . aspirin EC 81 MG tablet Take 81 mg by mouth daily.      Marland Kitchen HYDROmorphone (DILAUDID) 2 MG tablet Take 2 mg by mouth every 8 (eight) hours as needed for severe pain.      . Melatonin 1 MG SUBL Place 1 mg under the tongue at bedtime.      . Multiple Vitamin (MULTI VITAMIN DAILY PO) Take 1 tablet by mouth every morning.      . nebivolol (BYSTOLIC) 5 MG tablet Take 1 tablet (5 mg total) by mouth 2 (two) times daily.  60 tablet  6  . prednisoLONE acetate (PRED FORTE) 1 % ophthalmic suspension Place 1 drop into both eyes 2 (two) times daily.       . promethazine (PHENERGAN) 12.5 MG tablet Take 1 tablet (12.5 mg  total) by mouth every 6 (six) hours as needed for nausea or vomiting.  20 tablet  0     ROS:                                                                                                                                       History obtained from the patient  General ROS: negative for - chills, fatigue, fever, night sweats, weight gain or weight loss Psychological ROS: negative for - behavioral disorder, hallucinations, memory difficulties, mood swings or suicidal ideation Ophthalmic ROS: negative for - blurry vision, double vision, eye pain or loss of vision ENT ROS: negative for - epistaxis, nasal discharge, oral lesions, sore throat, tinnitus  or vertigo Allergy and Immunology ROS: negative for - hives or itchy/watery eyes Hematological and Lymphatic ROS: negative for - bleeding problems, bruising or swollen lymph nodes Endocrine ROS: negative for - galactorrhea, hair pattern changes, polydipsia/polyuria or temperature intolerance Respiratory ROS: negative for - cough, hemoptysis, shortness of breath or wheezing Cardiovascular ROS: negative for - chest pain, dyspnea on exertion, edema or irregular heartbeat Gastrointestinal ROS: negative for - abdominal pain, diarrhea, hematemesis, nausea/vomiting or stool incontinence Genito-Urinary ROS: negative for - dysuria, hematuria, incontinence or urinary frequency/urgency Musculoskeletal ROS: negative for - joint swelling or muscular weakness Neurological ROS: as noted in HPI Dermatological ROS: negative for rash and skin lesion changes  Neurologic Examination:                                                                                                      Blood pressure 167/85, pulse 76, temperature 98.4 F (36.9 C), temperature source Oral, resp. rate 15, SpO2 99.00%.   General: NAD Mental Status: Alert, oriented, thought content appropriate.  Speech dysarthric without evidence of aphasia.  Able to follow 3 step commands without  difficulty. Cranial Nerves: II: Disc flat ; Visual fields grossly normal (blind in left eye, right pupil, round, reactive to light --left pupil not visualized due to corneal scaring III,IV, VI: ptosis not present, extra-ocular motions intact bilaterally V,VII: smile asymmetric on the right, facial light touch sensation normal bilaterally VIII: hearing normal bilaterally IX,X: gag reflex present XI: bilateral shoulder shrug XII: midline tongue extension without atrophy or fasciculations  Motor: Right : Upper extremity   5-/5    Left:     Upper extremity   5/5  Lower extremity   5/5     Lower extremity   5/5 --Right arm drift Tone and bulk:normal tone throughout; no atrophy noted Sensory: Pinprick and light touch intact throughout, bilaterally Deep Tendon Reflexes:  Right: Upper Extremity   Left: Upper extremity   biceps (C-5 to C-6) 2/4   biceps (C-5 to C-6) 2/4 tricep (C7) 2/4    triceps (C7) 2/4 Brachioradialis (C6) 2/4  Brachioradialis (C6) 2/4  Lower Extremity Lower Extremity  quadriceps (L-2 to L-4) 2/4   quadriceps (L-2 to L-4) 2/4 Achilles (S1) 0/4   Achilles (S1) 0/4  Plantars: Right: downgoing   Left: downgoing Cerebellar: normal finger-to-nose,  normal heel-to-shin test Gait: not tested due to multiple leads CV: pulses palpable throughout    Lab Results: Basic Metabolic Panel:  Recent Labs Lab 01/01/14 1518 01/04/14 1431  NA 138 138  K 4.1 4.1  CL 101 100  CO2 28  --   GLUCOSE 82 117*  BUN 17 17  CREATININE 0.6 0.70  CALCIUM 9.0  --     Liver Function Tests: No results found for this basename: AST, ALT, ALKPHOS, BILITOT, PROT, ALBUMIN,  in the last 168 hours No results found for this basename: LIPASE, AMYLASE,  in the last 168 hours No results found for this basename: AMMONIA,  in the last 168 hours  CBC:  Recent Labs Lab 01/01/14 1518 01/04/14 1419 01/04/14 1431  WBC 6.6 5.4  --   NEUTROABS 3.7  --   --   HGB 13.9 14.6 15.6*  HCT 42.3 44.0  46.0  MCV 92.9 93.2  --   PLT 208.0 191  --     Cardiac Enzymes: No results found for this basename: CKTOTAL, CKMB, CKMBINDEX, TROPONINI,  in the last 168 hours  Lipid Panel: No results found for this basename: CHOL, TRIG, HDL, CHOLHDL, VLDL, LDLCALC,  in the last 168 hours  CBG: No results found for this basename: GLUCAP,  in the last 168 hours  Microbiology: Results for orders placed during the hospital encounter of 12/23/12  CULTURE, BLOOD (ROUTINE X 2)     Status: None   Collection Time    12/23/12  5:50 PM      Result Value Ref Range Status   Specimen Description BLOOD LEFT ARM   Final   Special Requests BOTTLES DRAWN AEROBIC AND ANAEROBIC 5ML   Final   Culture  Setup Time     Final   Value: 12/23/2012 22:00     Performed at [facility]   Culture     Final   Value: NO GROWTH 5 DAYS     Performed at [facility]   Report Status 12/29/2012 FINAL   Final  CULTURE, BLOOD (ROUTINE X 2)     Status: None   Collection Time    12/23/12  6:55 PM      Result Value Ref Range Status   Specimen Description BLOOD RIGHT ARM   Final   Special Requests BOTTLES DRAWN AEROBIC AND ANAEROBIC 4ML   Final   Culture  Setup Time     Final   Value: 12/23/2012 21:51     Performed at [facility]   Culture     Final   Value: NO GROWTH 5 DAYS     Performed at [facility]   Report Status 12/29/2012 FINAL   Final  URINE CULTURE     Status: None   Collection Time    12/23/12  7:30 PM      Result Value Ref Range Status   Specimen Description URINE, CLEAN CATCH   Final   Special Requests NONE   Final   Culture  Setup Time     Final   Value: 12/24/2012 02:38     Performed at [facility]     Final   Value: 60,000 COLONIES/ML     Performed at [facility]   Culture     Final   Value: Multiple bacterial morphotypes present, none predominant. Suggest appropriate recollection if clinically indicated.     Performed at [facility]   Report Status 12/25/2012 FINAL   Final  MRSA PCR SCREENING     Status: None   Collection Time    12/24/12  2:34 AM      Result Value Ref Range Status   MRSA by PCR NEGATIVE  NEGATIVE Final   Comment:            The GeneXpert MRSA Assay (FDA     approved for NASAL specimens     only), is one component of a     comprehensive MRSA colonization     surveillance program. It is not     intended to diagnose MRSA     infection nor to guide or     monitor treatment for     MRSA infections.  Coagulation Studies:  Recent Labs  01/04/14 1419  LABPROT 13.5  INR 1.02    Imaging: Ct Head Wo Contrast  01/04/2014   CLINICAL DATA:  Right-sided facial droop, slurred speech.  EXAM: CT HEAD WITHOUT CONTRAST  TECHNIQUE: Contiguous axial images were obtained from the base of the skull through the vertex without intravenous contrast.  COMPARISON:  CT scan of October 12, 2007.  FINDINGS: Bony calvarium appears intact. Mild diffuse cortical atrophy is noted. Mild chronic ischemic white matter disease is noted. No mass effect or midline shift is noted. Ventricular size is within normal limits. There is no evidence of mass lesion, hemorrhage or acute infarction.  IMPRESSION: Mild diffuse cortical atrophy. Mild chronic ischemic white matter disease. No acute intracranial abnormality seen. Critical Value/emergent results were called by telephone at the time of interpretation on 01/04/2014 at 2:38 pm to Dr. Noemi Chapel, who verbally acknowledged these results.   Electronically Signed   By: Sabino Dick M.D.   On: 01/04/2014 14:39       Assessment and plan discussed with with attending physician and they are in agreement.    Etta Quill PA-C [facility] [phone]  01/04/2014, 3:00 PM   I have seen and evaluated the patient. I have reviewed the above note and made appropriate changes.    Assessment: 80 y.o. female with new onset of right facial droop, dysarthria, right  arm  weakness.  She was not a tPA candidate due to mild, improving symptoms.   Stroke Risk Factors - none  1. HgbA1c, fasting lipid panel 2. MRI, MRA  of the brain without contrast 3. PT consult, OT consult, Speech consult 4. Echocardiogram 5. Carotid dopplers 6. Prophylactic therapy-Antiplatelet med: Aspirin - dose 81 mg daily 7. Risk factor modification 8. Telemetry monitoring 9. Frequent neuro checks  Roland Rack, MD [facility] [phone]  If 7pm- 7am, please page neurology on call as listed in [place].

## 2014-01-04 NOTE — ED Notes (Signed)
Per Stroke nurse, keep patient flat and do swallow screen upstairs.

## 2014-01-04 NOTE — ED Notes (Signed)
Dr. Leonel Ramsay at bedside discussing plan of action with family.

## 2014-01-05 ENCOUNTER — Encounter (HOSPITAL_COMMUNITY)
Admission: EM | Disposition: A | Discharge status: Home Health | Payer: Self-pay | Source: Home / Self Care | Attending: Internal Medicine

## 2014-01-05 ENCOUNTER — Other Ambulatory Visit: Payer: Self-pay | Admitting: Neurology

## 2014-01-05 DIAGNOSIS — I639 Cerebral infarction, unspecified: Secondary | ICD-10-CM

## 2014-01-05 DIAGNOSIS — I471 Supraventricular tachycardia: Secondary | ICD-10-CM

## 2014-01-05 DIAGNOSIS — I63412 Cerebral infarction due to embolism of left middle cerebral artery: Secondary | ICD-10-CM

## 2014-01-05 DIAGNOSIS — I369 Nonrheumatic tricuspid valve disorder, unspecified: Secondary | ICD-10-CM

## 2014-01-05 DIAGNOSIS — R002 Palpitations: Secondary | ICD-10-CM

## 2014-01-05 DIAGNOSIS — R531 Weakness: Secondary | ICD-10-CM

## 2014-01-05 DIAGNOSIS — E785 Hyperlipidemia, unspecified: Secondary | ICD-10-CM

## 2014-01-05 HISTORY — PX: LOOP RECORDER IMPLANT: SHX5477

## 2014-01-05 LAB — LIPID PANEL
CHOLESTEROL: 158 mg/dL (ref 0–200)
HDL: 49 mg/dL (ref >39)
LDL Cholesterol: 91 mg/dL (ref 0–99)
Total CHOL/HDL Ratio: 3.2 RATIO
Triglycerides: 92 mg/dL (ref <150)
VLDL: 18 mg/dL (ref 0–40)

## 2014-01-05 LAB — HEMOGLOBIN A1C
Hgb A1c MFr Bld: 6 % — ABNORMAL HIGH (ref <5.7)
Mean Plasma Glucose: 126 mg/dL — ABNORMAL HIGH (ref <117)

## 2014-01-05 SURGERY — LOOP RECORDER IMPLANT
Anesthesia: LOCAL

## 2014-01-05 MED ORDER — LIDOCAINE-EPINEPHRINE 1 %-1:100000 IJ SOLN
INTRAMUSCULAR | Status: AC
  Filled 2014-01-05: qty 1

## 2014-01-05 MED ORDER — SIMVASTATIN 20 MG PO TABS
20.0000 mg | ORAL_TABLET | Freq: Every day | ORAL | Status: DC
  Administered 2014-01-05 – 2014-01-06 (×2): 20 mg via ORAL
  Filled 2014-01-05 (×2): qty 1

## 2014-01-05 MED ORDER — NEBIVOLOL HCL 5 MG PO TABS
5.0000 mg | ORAL_TABLET | Freq: Two times a day (BID) | ORAL | Status: DC
  Administered 2014-01-05: 5 mg via ORAL
  Filled 2014-01-05 (×2): qty 1

## 2014-01-05 NOTE — Progress Notes (Signed)
STROKE TEAM PROGRESS NOTE   HISTORY [name] [name] is an 80 y.o. female presenting to ED after she noticed a sudden onset of right arm weakness, decreased sensation and inability to keep food in the right side of her mouth. The above symptoms started at 1330 while eating lunch. Patient called daughter who also noted dysarthria on the phone. She was brought to ED by daughter, She walked into ED triage at which point a code stroke was called. On initial arrival patient had a right facial droop, dysarthria but no arm weakness. While examining patient her right facial weakness tended to wax and wane. tPA was not offered due to minimal symptoms. Patient was offered to be in study for minimal symptoms but declined.   Date last known well: Date: 01/04/2014  Time last known well: Time: 13:30  tPA Given: No: minimal symptoms and NIHSS 3  SUBJECTIVE (INTERVAL HISTORY) Multiple family members present. Pt feels back to baseline. MRI showed cortical stroke on the left frontal. History of SVT. Highly suspicious for Afib. Daughter said pt had 30 day monitor in the past but not sure result. I did not find report on Epic. Pt is following with Dr. Burt Knack and Dr. Caryl Comes.     OBJECTIVE Temp:  [97.8 F (36.6 C)-99.1 F (37.3 C)] 98.2 F (36.8 C) (10/16 1129) Pulse Rate:  [63-71] 64 (10/16 1129) Cardiac Rhythm:  [-] Normal sinus rhythm (10/15 2036) Resp:  [16-99] 18 (10/16 1129) BP: (127-191)/(53-90) 191/74 mmHg (10/16 1129) SpO2:  [93 %-99 %] 99 % (10/16 1129)  No results found for this basename: GLUCAP,  in the last 168 hours  Recent Labs Lab 01/01/14 1518 01/04/14 1419 01/04/14 1431  NA 138 140 138  K 4.1 4.3 4.1  CL 101 100 100  CO2 28 27  --   GLUCOSE 82 116* 117*  BUN 17 15 17   CREATININE 0.6 0.60 0.70  CALCIUM 9.0 9.4  --    No results found for this basename: AST, ALT, ALKPHOS, BILITOT, PROT, ALBUMIN,  in the last 168 hours  Recent Labs Lab 01/01/14 1518 01/04/14 1419 01/04/14 1431  WBC  6.6 5.4  --   NEUTROABS 3.7  --   --   HGB 13.9 14.6 15.6*  HCT 42.3 44.0 46.0  MCV 92.9 93.2  --   PLT 208.0 191  --     Recent Labs Lab 01/04/14 1419  TROPONINI <0.30    Recent Labs  01/04/14 1419  LABPROT 13.5  INR 1.02    Recent Labs  01/04/14 1541  COLORURINE YELLOW  LABSPEC 1.006  PHURINE 7.5  GLUCOSEU NEGATIVE  HGBUR NEGATIVE  BILIRUBINUR NEGATIVE  KETONESUR NEGATIVE  PROTEINUR NEGATIVE  UROBILINOGEN 0.2  NITRITE NEGATIVE  LEUKOCYTESUR TRACE*       Component Value Date/Time   CHOL 158 01/05/2014 0346   TRIG 92 01/05/2014 0346   HDL 49 01/05/2014 0346   CHOLHDL 3.2 01/05/2014 0346   VLDL 18 01/05/2014 0346   LDLCALC 91 01/05/2014 0346   Lab Results  Component Value Date   HGBA1C 6.0* 01/05/2014   No results found for this basename: labopia,  cocainscrnur,  labbenz,  amphetmu,  thcu,  labbarb    No results found for this basename: ETH,  in the last 168 hours  Dg Chest 2 View 01/04/2014    No active cardiopulmonary disease.     Ct Head Wo Contrast 01/04/2014   Mild diffuse cortical atrophy. Mild chronic ischemic white matter disease. No acute intracranial  abnormality seen.   Mri and Mra Brain Wo Contrast 01/04/2014   MRI HEAD IMPRESSION:   1. Acute ischemic infarct involving the cortical gray matter of the high left frontal lobe as above. No associated mass effect or hemorrhage.  2. Atrophy with moderate chronic microvascular ischemic disease.    MRA HEAD IMPRESSION:   1. No proximal branch occlusion identified within the intracranial circulation.  2. Single moderate short-segment stenosis within the proximal right posterior cerebral artery. No other hemodynamically significant stenosis identified within the intracranial circulation.     CUS - Findings suggest 1-39% internal carotid artery stenosis bilaterally. Vertebral arteries are patent with antegrade flow.   Venous doppler  Bilateral lower extremities are negative for deep vein  thrombosis. There is no evidence of Baker's cyst bilaterally.  2D echo - Left ventricle: The cavity size was normal. Wall thickness was increased in a pattern of mild LVH. The estimated ejection fraction was 60%. Wall motion was normal; there were no regional wall motion abnormalities. - Mitral valve: Mildly calcified annulus. - Right ventricle: The cavity size was normal. Systolic function was normal. - Impressions: No cardiac source of embolism was identified, but cannot be ruled out on the basis of this examination. Impressions: - No cardiac source of embolism was identified, but cannot be ruled out on the basis of this examination.  PHYSICAL EXAM  Temp:  [97.8 F (36.6 C)-99.1 F (37.3 C)] 98.2 F (36.8 C) (10/16 1129) Pulse Rate:  [63-71] 64 (10/16 1129) Resp:  [16-99] 18 (10/16 1129) BP: (127-191)/(53-90) 191/74 mmHg (10/16 1129) SpO2:  [93 %-99 %] 99 % (10/16 1129)  General - Well nourished, well developed, in no apparent distress.  Ophthalmologic - not able to see through due to bilateral corneal disorders.  Cardiovascular - Regular rate and rhythm with no murmur.  Mental Status -  Level of arousal and orientation to time, place, and person were intact. Language including expression, naming, repetition, comprehension was assessed and found intact. Attention span and concentration were normal. Fund of Knowledge was assessed and was intact.  Cranial Nerves II - XII - II - Visual field intact OD, left eye blind. III, IV, VI - Extraocular movements intact. V - Facial sensation intact bilaterally. VII - mild right nasolabial fold flatening. VIII - Hearing & vestibular intact bilaterally. X - Palate elevates symmetrically. XI - Chin turning & shoulder shrug intact bilaterally. XII - Tongue protrusion intact.  Motor Strength - The patient's strength was normal in all extremities and pronator drift was absent.  Bulk was normal and fasciculations were absent.   Motor  Tone - Muscle tone was assessed at the neck and appendages and was normal.  Reflexes - The patient's reflexes were 1+ in all extremities and she had no pathological reflexes.  Sensory - Light touch, temperature/pinprick were assessed and were normal.    Coordination - The patient had normal movements in the hands with no ataxia or dysmetria.  Tremor was absent.  Gait and Station - not tested.   ASSESSMENT/PLAN Ms. Leesa Leifheit is a 80 y.o. female with history of SVT, CHF, Htn, and blind left eye presenting with dysarthria and right arm weakness.  She did not receive IV tpa due to minimal sxs.   Stroke:  acute ischemic infarct involving the cortical gray matter of the high left frontal lobe. Infarct secondary to  emboli of unknown source. Highly suspicious for PAF as old age and hx of SVT.  MRI - acute ischemic infarct involving the  cortical gray matter of the high left frontal lobe  MRA - single moderate short-segment stenosis within the proximal right posterior cerebral artery.  Carotid Doppler - unremarkable  2D Echo - EF 60%, no cardiac source of emboli  LE venous doppler - negative for DVT  LDL 91, not meeting the goal < 70  HgbA1c 6.0  Lovenox for VTE prophylaxis  Cardiac with thin liquids  Up with assistance  aspirin 81 mg orally every day prior to admission, now on aspirin 325 mg orally every day  Patient counseled to be compliant with her antithrombotic medications  Risk factor education  Ongoing aggressive risk factor management  Resultant - resolution of deicits  Therapy recommendations:  Home PT with 24h supervision  Disposition:  Home with daughter  Discussed with Dr. Rayann Heman, and loop recorder will be placed to rule out PAF. She will need to continue follow up with Dr. Burt Knack as outpt for SVT.   Hypertension  Home meds:  Bystolic 5 mg Bid  Stable   Permissive hypertension (OK if < 220/120) but gradually normalize in 5-7 days  Patient counseled  to be compliant with her blood pressure medications  Hyperlipidemia  Home meds:  none  LDL - 91  Goal < 70  Added - Zocor  Continue statin at discharge  Other Stroke Risk Factors Advanced age   Family hx stroke (Mother)  Other Active Problems  SVT - follow up with Dr. Burt Knack as outpt. Daughter said had 30 day event monitoring but no report in Epic. Pt still has intermittent palpitation.   ? afib - highly suspicious for afib as cause of cortical stroke.  Other Pertinent History  CHF hx  Legally blind on the left.   Hospital day # 1  Mikey Bussing PA-C [facility] Pager [phone] 01/05/2014, 5:34 PM  I, the attending vascular neurologist, have personally obtained a history, examined the patient, evaluated laboratory data, individually viewed imaging studies, and formulated the assessment and plan of care.  I have made any additions or clarifications directly to the above note and agree with the findings and plan as currently documented.   Neurology will sign off. Please call with questions. Pt will follow up with Dr. Erlinda Hong at [facility] in about 2 months. Thanks for the consult.  Rosalin Hawking, MD PhD Stroke Neurology 01/05/2014 5:47 PM   To contact Stroke Continuity provider, please refer to [email or web address] After hours, contact General Neurology

## 2014-01-05 NOTE — Consult Note (Signed)
ELECTROPHYSIOLOGY CONSULT NOTE  Patient ID: [name] [name] MRN: [id number], DOB/AGE: [birth year]   Admit date: 01/04/2014 Date of Consult: 01/05/2014  Primary Physician: Marjorie Smolder, MD Primary Cardiologist: Dr Burt Knack Reason for Consultation: Cryptogenic stroke; recommendations regarding Implantable Loop Recorder  History of Present Illness [name] [name] was admitted on 01/04/2014 with acute CVA. she has been monitored on telemetry which has demonstrated no arrhythmias. She has a h/o SVT for which she has seen Dr Caryl Comes previously.  She does not have a h/o afib.  She has had significant impairment with her recent stroke.  No cause has been identified. Inpatient stroke work-up is to be completed with 2D echo and carotid dopplers.  Given her advanced age and fragility, she is felt to be a poor candidate for TEE.  EP has been asked to evaluate for placement of an implantable loop recorder to monitor for atrial fibrillation.  Past Medical History Past Medical History  Diagnosis Date  . Arthritis   . Blind left eye     Corneal transplant x3 with rejection  . Dysrhythmia   . Varicose veins     both legs and left fooot at arch  . Complication of anesthesia     sensitive to meds, bp can fall  . SVT (supraventricular tachycardia) 12/15/2011    seen by Dr. Caryl Comes  . Helicobacter pylori (H. pylori) 05/22/02    RUT-Positive  . Gastritis   . Diverticulosis     Past Surgical History Past Surgical History  Procedure Laterality Date  . Cholecystectomy    . Tonsillectomy    . Tonsillectomy    . Corneal transplant  right 2009, left 2009    both eyes, 3 in left eye, 1 in right eye  . Cataract surgery  2005    both eyes  . Abdominal hysterectomy   sept 1985    ovaries removed  . Total knee arthroplasty Left 06/27/2012    Procedure: LEFT TOTAL KNEE ARTHROPLASTY;  Surgeon: Gearlean Alf, MD;  Location: [facility];  Service: Orthopedics;  Laterality: Left;  . Tubal ligation    . Total knee  arthroplasty Right 12/12/2012    Procedure: RIGHT TOTAL KNEE ARTHROPLASTY;  Surgeon: Gearlean Alf, MD;  Location: [facility];  Service: Orthopedics;  Laterality: Right;    Allergies/Intolerances Allergies  Allergen Reactions  . Ultram [Tramadol] Hives  . Buprenex [Buprenorphine Hcl] Other (See Comments)    Heart raced  . Augmentin [Amoxicillin-Pot Clavulanate] Nausea And Vomiting  . Levofloxacin Other (See Comments)    levaquin unknown reaction  . Percocet [Oxycodone-Acetaminophen] Itching and Other (See Comments)    "felt drained"  . Sulfa Antibiotics Other (See Comments)    unknown  . Tape Other (See Comments)    Adhesive tape causes skin to pull off  . Zetia [Ezetimibe] Other (See Comments)    unknown   Inpatient Medications . aspirin  325 mg Oral Daily  . enoxaparin (LOVENOX) injection  40 mg Subcutaneous Q24H  . prednisoLONE acetate  1 drop Both Eyes BID  . simvastatin  20 mg Oral q1800     Social History History   Social History  . Marital Status: Widowed    Spouse Name: N/A    Number of Children: N/A  . Years of Education: N/A   Occupational History  . Not on file.   Social History Main Topics  . Smoking status: Never Smoker   . Smokeless tobacco: Never Used  . Alcohol Use: No  . Drug Use: No  .  Sexual Activity: No   Other Topics Concern  . Not on file   Social History Narrative  . No narrative on file    Review of Systems General: No chills, fever, night sweats or weight changes  Cardiovascular:  No chest pain, dyspnea on exertion, edema, orthopnea, palpitations, paroxysmal nocturnal dyspnea Dermatological: No rash, lesions or masses Respiratory: No cough, dyspnea Urologic: No hematuria, dysuria Abdominal: No nausea, vomiting, diarrhea, bright red blood per rectum, melena, or hematemesis Neurologic: No visual changes, weakness, changes in mental status All other systems reviewed and are otherwise negative except as noted above.  Physical  Exam Blood pressure 191/74, pulse 64, temperature 98.2 F (36.8 C), temperature source Oral, resp. rate 18, SpO2 99.00%.  General: Well developed, well appearing 80 y.o. female in no acute distress. HEENT: Normocephalic, atraumatic. EOMs intact. Sclera nonicteric. Oropharynx clear.  Neck: Supple without bruits. No JVD. Lungs: Respirations regular and unlabored, CTA bilaterally. No wheezes, rales or rhonchi. Heart: RRR. S1, S2 present. No murmurs, rub, S3 or S4. Abdomen: Soft, non-tender, non-distended. BS present x 4 quadrants. No hepatosplenomegaly.  Extremities: No clubbing, cyanosis or edema. DP/PT/Radials 2+ and equal bilaterally. Psych: Normal affect. Neuro: Alert and oriented X 3. R facial drooop Musculoskeletal: No kyphosis. Skin: Intact. Warm and dry. No rashes or petechiae in exposed areas.   Labs Lab Results  Component Value Date   WBC 5.4 01/04/2014   HGB 15.6* 01/04/2014   HCT 46.0 01/04/2014   MCV 93.2 01/04/2014   PLT 191 01/04/2014    Recent Labs Lab 01/04/14 1419 01/04/14 1431  NA 140 138  K 4.3 4.1  CL 100 100  CO2 27  --   BUN 15 17  CREATININE 0.60 0.70  CALCIUM 9.4  --   GLUCOSE 116* 117*    Recent Labs  01/04/14 1419  INR 1.02    Radiology/Studies Dg Chest 2 View  01/04/2014   CLINICAL DATA:  Stroke.  Initial encounter.  EXAM: CHEST  2 VIEW  COMPARISON:  12/23/2012  FINDINGS: There is mild cardiomegaly which is chronic. Mediastinal contours are stable from previous, with apparent upper mediastinal widening related to apical lordotic positioning. There is no edema, consolidation, effusion, or pneumothorax.  IMPRESSION: No active cardiopulmonary disease.   Electronically Signed   By: Jorje Guild M.D.   On: 01/04/2014 23:27   Ct Head Wo Contrast  01/04/2014   CLINICAL DATA:  Right-sided facial droop, slurred speech.  EXAM: CT HEAD WITHOUT CONTRAST  TECHNIQUE: Contiguous axial images were obtained from the base of the skull through the vertex  without intravenous contrast.  COMPARISON:  CT scan of October 12, 2007.  FINDINGS: Bony calvarium appears intact. Mild diffuse cortical atrophy is noted. Mild chronic ischemic white matter disease is noted. No mass effect or midline shift is noted. Ventricular size is within normal limits. There is no evidence of mass lesion, hemorrhage or acute infarction.  IMPRESSION: Mild diffuse cortical atrophy. Mild chronic ischemic white matter disease. No acute intracranial abnormality seen. Critical Value/emergent results were called by telephone at the time of interpretation on 01/04/2014 at 2:38 pm to Dr. Noemi Chapel, who verbally acknowledged these results.   Electronically Signed   By: Sabino Dick M.D.   On: 01/04/2014 14:39   Mr Brain Wo Contrast  01/04/2014   CLINICAL DATA:  Initial evaluation for a sudden onset right arm weakness. Evaluate for stroke.  EXAM: MRI HEAD WITHOUT CONTRAST  MRA HEAD WITHOUT CONTRAST  TECHNIQUE: Multiplanar, multiecho pulse sequences  of the brain and surrounding structures were obtained without intravenous contrast. Angiographic images of the head were obtained using MRA technique without contrast.  COMPARISON:  Prior CT performed earlier on the same day.  FINDINGS: MRI HEAD FINDINGS  There is a wedge-shaped area of restricted diffusion involving the cortical gray matter of the high left frontal lobe, compatible with acute ischemic infarct (series 4, image 27). This area of infarction measures 9.1 x 15.3 mm, and appears to involve the motor cortex. No associated hemorrhage or significant mass effect. No other intracranial hemorrhage. Gray-white matter differentiation is otherwise maintained.  No mass lesion or midline shift. Patchy T2/FLAIR hyperintensity within the periventricular and deep white matter both cerebral hemispheres is most compatible with chronic small vessel ischemic changes, moderate for patient age. Diffuse prominence of the CSF containing spaces is compatible with  generalized atrophy. Ventricles are within normal limits without evidence of hydrocephalus. No extra-axial fluid collection.  Craniocervical junction within normal limits. Pituitary gland unremarkable.  Chronic changes seen at the left globe. No acute abnormality seen about either orbit.  Signal intensity within the visualized bone marrow is normal. Mild degenerative changes noted within the upper cervical spine.  Paranasal sinuses and mastoid air cells are clear.  MRA HEAD FINDINGS  ANTERIOR CIRCULATION:  Visualized portions of the distal cervical segments of the internal carotid arteries are widely patent with antegrade flow. The petrous, cavernous, and supra clinoid segments are widely patent without hemodynamically significant stenosis.  A1 segments, anterior communicating artery, and anterior cerebral arteries are widely patent without high-grade stenosis or acute abnormality.  M1 segments are widely patent bilaterally without proximal branch occlusion or significant stenosis. MCA bifurcations within normal limits. Distal MCA branches patent bilaterally.  POSTERIOR CIRCULATION:  Left vertebral artery is dominant. Posterior inferior cerebral arteries patent bilaterally. Vertebrobasilar junction and basilar artery within normal limits without high-grade stenosis or proximal branch occlusion. Superior cerebral arteries patent bilaterally. The left superior cerebral artery appears to arise from the left P1 segment. There is a single short segment moderate stenosis within the proximal right posterior cerebral artery.  No aneurysm or vascular malformation identified within the intracranial circulation.  IMPRESSION: MRI HEAD IMPRESSION:  1. Acute ischemic infarct involving the cortical gray matter of the high left frontal lobe as above. No associated mass effect or hemorrhage. 2. Atrophy with moderate chronic microvascular ischemic disease.  MRA HEAD IMPRESSION:  1. No proximal branch occlusion identified within the  intracranial circulation. 2. Single moderate short-segment stenosis within the proximal right posterior cerebral artery. No other hemodynamically significant stenosis identified within the intracranial circulation.   Electronically Signed   By: Jeannine Boga M.D.   On: 01/04/2014 23:36   Ct Abdomen Pelvis W Contrast  01/02/2014   CLINICAL DATA:  Recent diverticulitis. Left lower quadrant abdominal pain. Patient on second round of antibiotics.  EXAM: CT ABDOMEN AND PELVIS WITH CONTRAST  TECHNIQUE: Multidetector CT imaging of the abdomen and pelvis was performed using the standard protocol following bolus administration of intravenous contrast.  CONTRAST:  141mL OMNIPAQUE IOHEXOL 300 MG/ML  SOLN  COMPARISON:  01/30/2011  FINDINGS: Lower chest: The lung bases are clear. The heart is normal in size. No pericardial effusion. Coronary artery calcifications are noted.  Hepatobiliary: Mild central intrahepatic and mild common bile duct dilatation likely due to prior cholecystectomy. No focal hepatic lesions.  Pancreas: Normal.  Spleen: Normal.  Adrenals/Urinary Tract: Normal.  Stomach/Bowel: The stomach, duodenum, small bowel and colon are grossly normal. No inflammatory changes, mass  lesions or obstructive findings. Moderate diverticulosis of the sigmoid colon. There is a tiny wisp of fluid near the upper sigmoid colon which may be the sequela of prior diverticulitis. The appendix is normal.  Vascular/Lymphatic: Moderate atherosclerotic calcifications involving the aorta but no focal aneurysm or dissection. The branch vessels are patent. No mesenteric or retroperitoneal mass or lymphadenopathy. Small scattered lymph nodes are noted.  Pelvis: The uterus is surgically absent. The right ovary is still present. The left ovary is not identified for certain. The bladder is normal except for small cystocele. No inguinal mass or adenopathy.  Musculoskeletal: No significant bony findings.  IMPRESSION: No findings for  acute diverticulitis. Small wisp of fluid near the upper sigmoid colon is likely the sequela of resolved diverticulitis.  Moderate stool in the ascending and transverse colon.  Small cystocele.   Electronically Signed   By: Kalman Jewels M.D.   On: 01/02/2014 16:02   Mr Jodene Nam Head/brain Wo Cm  01/04/2014   CLINICAL DATA:  Initial evaluation for a sudden onset right arm weakness. Evaluate for stroke.  EXAM: MRI HEAD WITHOUT CONTRAST  MRA HEAD WITHOUT CONTRAST  TECHNIQUE: Multiplanar, multiecho pulse sequences of the brain and surrounding structures were obtained without intravenous contrast. Angiographic images of the head were obtained using MRA technique without contrast.  COMPARISON:  Prior CT performed earlier on the same day.  FINDINGS: MRI HEAD FINDINGS  There is a wedge-shaped area of restricted diffusion involving the cortical gray matter of the high left frontal lobe, compatible with acute ischemic infarct (series 4, image 27). This area of infarction measures 9.1 x 15.3 mm, and appears to involve the motor cortex. No associated hemorrhage or significant mass effect. No other intracranial hemorrhage. Gray-white matter differentiation is otherwise maintained.  No mass lesion or midline shift. Patchy T2/FLAIR hyperintensity within the periventricular and deep white matter both cerebral hemispheres is most compatible with chronic small vessel ischemic changes, moderate for patient age. Diffuse prominence of the CSF containing spaces is compatible with generalized atrophy. Ventricles are within normal limits without evidence of hydrocephalus. No extra-axial fluid collection.  Craniocervical junction within normal limits. Pituitary gland unremarkable.  Chronic changes seen at the left globe. No acute abnormality seen about either orbit.  Signal intensity within the visualized bone marrow is normal. Mild degenerative changes noted within the upper cervical spine.  Paranasal sinuses and mastoid air cells are  clear.  MRA HEAD FINDINGS  ANTERIOR CIRCULATION:  Visualized portions of the distal cervical segments of the internal carotid arteries are widely patent with antegrade flow. The petrous, cavernous, and supra clinoid segments are widely patent without hemodynamically significant stenosis.  A1 segments, anterior communicating artery, and anterior cerebral arteries are widely patent without high-grade stenosis or acute abnormality.  M1 segments are widely patent bilaterally without proximal branch occlusion or significant stenosis. MCA bifurcations within normal limits. Distal MCA branches patent bilaterally.  POSTERIOR CIRCULATION:  Left vertebral artery is dominant. Posterior inferior cerebral arteries patent bilaterally. Vertebrobasilar junction and basilar artery within normal limits without high-grade stenosis or proximal branch occlusion. Superior cerebral arteries patent bilaterally. The left superior cerebral artery appears to arise from the left P1 segment. There is a single short segment moderate stenosis within the proximal right posterior cerebral artery.  No aneurysm or vascular malformation identified within the intracranial circulation.  IMPRESSION: MRI HEAD IMPRESSION:  1. Acute ischemic infarct involving the cortical gray matter of the high left frontal lobe as above. No associated mass effect or hemorrhage. 2.  Atrophy with moderate chronic microvascular ischemic disease.  MRA HEAD IMPRESSION:  1. No proximal branch occlusion identified within the intracranial circulation. 2. Single moderate short-segment stenosis within the proximal right posterior cerebral artery. No other hemodynamically significant stenosis identified within the intracranial circulation.   Electronically Signed   By: Jeannine Boga M.D.   On: 01/04/2014 23:36    Echocardiogram  reviewed  12-lead ECG- I have personally reviewed every ekg in epic as well as her event monitor and holter monitor.  She has SVt documented but  no afib. Telemetry sinus rhythm, no arrhythmias  Assessment and Plan:  1. Cryptogenic stroke The patient presents with severe cryptogenic stroke.  Work up has been unrevealing.  She has occasional palpitations which are concerning for afib as the possible cause for her stroke.  I spoke at length with the patient about monitoring for afib with either a 30 day event monitor or an implantable loop recorder.  Risks, benefits, and alteratives to implantable loop recorder were discussed with the patient today.   At this time, the patient is very clear in their decision to proceed with implantable loop recorder.   2. SVT Will follow clinically Would avoid ablation given advanced age and fragility  Please call with questions.

## 2014-01-05 NOTE — Op Note (Signed)
SURGEON:  Thompson Grayer, MD     PREPROCEDURE DIAGNOSIS:  Cryptogenic Stroke    POSTPROCEDURE DIAGNOSIS:  Cryptogenic Stroke     PROCEDURES:   1. Implantable loop recorder implantation    INTRODUCTION:  [name] [name] is a 80 y.o. female with a history of unexplained stroke who presents today for implantable loop implantation.  The patient has had a cryptogenic stroke.  Despite an extensive workup by neurology, no reversible causes have been identified.  she has worn telemetry during which she did not have arrhythmias.  There is significant concern for possible atrial fibrillation as the cause for the patients stroke.  The patient therefore presents today for implantable loop implantation.     DESCRIPTION OF PROCEDURE:  Informed written consent was obtained, and the patient was brought to the electrophysiology lab in a fasting state.  The patient required no sedation for the procedure today.  Mapping over the patient's chest was performed by the EP lab staff to identify the area where electrograms were most prominent for ILR recording.  This area was found to be the left parasternal region over the 3rd-4th intercostal space. The patients left chest was therefore prepped and draped in the usual sterile fashion by the EP lab staff. The skin overlying the left parasternal region was infiltrated with lidocaine for local analgesia.  A 0.5-cm incision was made over the left parasternal region over the 3rd intercostal space.  A subcutaneous ILR pocket was fashioned using a combination of sharp and blunt dissection.  A Medtronic Reveal [place] model [id number] implantable loop recorder was then placed into the pocket  R waves were very prominent and measured 0.68mV. EBL<1 ml.  Steri- Strips and a sterile dressing were then applied.  There were no early apparent complications.     CONCLUSIONS:   1. Successful implantation of a Medtronic Reveal LINQ implantable loop recorder for cryptogenic stroke  2. No  early apparent complications.

## 2014-01-05 NOTE — Evaluation (Signed)
Physical Therapy Evaluation Patient Details Name: [name] [name] MRN: [id number] DOB: May 16, 1924 Today's Date: 01/05/2014   History of Present Illness  Pt is an 80y.o. female presenting with R sided weakness, decreased sensation, and R facial drop s/p L frontal lobe ischemic CVA. TPA not administered. Hx of HTN, hyperlipidemia, recent diverticulitis, L eye blindness, dysrhythmia, and SVT.  Clinical Impression  PTA, pt independent with all functional mobility. Currently, pt requires min A for ambulation, supervision for transfers and mod I for bed mobility. Pt and family educated on "BE FAST" principle. Pt would benefit from continued skilled PT services to address deficits noted below. POC and d/c plan discussed with pt and family, all agreeable. Recommend home with 24hr supervision and HHPT.    Follow Up Recommendations Home health PT;Supervision/Assistance - 24 hour    Equipment Recommendations  None recommended by PT    Recommendations for Other Services       Precautions / Restrictions Precautions Precautions: Fall Precaution Comments: Pt does have L foot orthoses for amb long distances, not used during treatment.   Restrictions Weight Bearing Restrictions: No      Mobility  Bed Mobility Overal bed mobility: Modified Independent Bed Mobility: Supine to Sit;Sit to Supine     Supine to sit: Modified independent (Device/Increase time);HOB elevated Sit to supine: Modified independent (Device/Increase time);HOB elevated   General bed mobility comments: No assistance required for supine to/from sit EOB. HOB elevated  Transfers Overall transfer level: Needs assistance Equipment used: None Transfers: Sit to/from Stand Sit to Stand: Supervision         General transfer comment: min guard progressed to supervision. No instability or reports of dizziness with initial standing.   Ambulation/Gait Ambulation/Gait assistance: Min assist Ambulation Distance (Feet): 170  Feet Assistive device: None Gait Pattern/deviations: Step-through pattern;Antalgic;Decreased stride length;Decreased stance time - left Gait velocity: slow   General Gait Details: Pt reports feeling like she is "staggering." Instabiltiy noted during turns. min A for stabilization. No LOB. No reports of pain during ambulation. Occasional difficulty navigating obstacles on L.  Stairs            Wheelchair Mobility    Modified Rankin (Stroke Patients Only)       Balance Overall balance assessment: Needs assistance Sitting-balance support: Feet supported Sitting balance-Leahy Scale: Normal Sitting balance - Comments: Pt able to maintain balance during LE MMT.   Standing balance support: During functional activity Standing balance-Leahy Scale: Fair Standing balance comment: with static standing, no assistance or support required. no dizziness reported. during ambulation, increased trunk sway.                              Pertinent Vitals/Pain Pain Assessment: No/denies pain    Home Living Family/patient expects to be discharged to:: Private residence Living Arrangements: Alone Available Help at Discharge: Family;Available 24 hours/day Type of Home: House Home Access: Ramped entrance;Stairs to enter Entrance Stairs-Rails: None Entrance Stairs-Number of Steps: 1 Home Layout: Multi-level Home Equipment: Walker - 2 wheels;Cane - quad Additional Comments: Pt lives in split level home. stairs with rail and additional support structures that can be used for assist to get to bedroom,     Prior Function Level of Independence: Independent         Comments: pt does not drive 2/2 L eye blindness.      Hand Dominance        Extremity/Trunk Assessment   Upper Extremity Assessment: Overall [facility]  for tasks assessed           Lower Extremity Assessment: Overall WFL for tasks assessed;LLE deficits/detail;RLE deficits/detail RLE Deficits / Details: strength,  sensation and ROM WFL LLE Deficits / Details: fx of L 4th metatarsal. Otherwise, strength, ROM and sensation WFL     Communication   Communication: No difficulties  Cognition Arousal/Alertness: Awake/alert Behavior During Therapy: WFL for tasks assessed/performed Overall Cognitive Status: Within Functional Limits for tasks assessed                      General Comments General comments (skin integrity, edema, etc.): Pt and family educated on stroke "BE FAST" principle. POC and d/c plan discussed with pt and family, both agreeable. Pt very adamant about maintaining independence, but agreed to having family supervision for first few days of d/c    Exercises        Assessment/Plan    PT Assessment Patient needs continued PT services  PT Diagnosis Abnormality of gait   PT Problem List Decreased balance;Decreased mobility;Decreased safety awareness  PT Treatment Interventions Gait training;Stair training;Functional mobility training;Therapeutic activities;Therapeutic exercise;Balance training;Neuromuscular re-education;Patient/family education   PT Goals (Current goals can be found in the Care Plan section) Acute Rehab PT Goals Patient Stated Goal: to go to her home and be independent PT Goal Formulation: With patient/family Time For Goal Achievement: 01/19/14 Potential to Achieve Goals: Good    Frequency Min 4X/week   Barriers to discharge Inaccessible home environment stairs to get to bed room    Co-evaluation               End of Session Equipment Utilized During Treatment: Gait belt Activity Tolerance: Patient tolerated treatment well Patient left: in bed;with call bell/phone within reach;with family/visitor present Nurse Communication: Mobility status         Time: 1250-1307 PT Time Calculation (min): 17 min   Charges:   PT Evaluation $Initial PT Evaluation Tier I: 1 Procedure PT Treatments $Gait Training: 8-22 mins   PT G Codes:           Nekeya Briski 01/05/2014, 2:14 PM Levonne Hubert, SPT

## 2014-01-05 NOTE — Evaluation (Signed)
Reviewed assessment and agree with POC.  Lieutenant Abarca, PT DPT  [phone]  

## 2014-01-05 NOTE — Plan of Care (Signed)
Problem: Consults Goal: Diabetes Guidelines if Diabetic/Glucose > 140 If diabetic or lab glucose is > 140 mg/dl - Initiate Diabetes/Hyperglycemia Guidelines & Document Interventions  Outcome: Not Applicable Date Met:  20/03/79 CBG < 140

## 2014-01-05 NOTE — Progress Notes (Signed)
UR Completed Ryane Konieczny Graves-Bigelow, RN,BSN [phone]  

## 2014-01-05 NOTE — Progress Notes (Signed)
*  PRELIMINARY RESULTS* Vascular Ultrasound Carotid Duplex (Doppler) has been completed.   Findings suggest 1-39% internal carotid artery stenosis bilaterally. Vertebral arteries are patent with antegrade flow.   Bilateral lower extremity venous duplex completed. Bilateral lower extremities are negative for deep vein thrombosis. There is no evidence of Baker's cyst bilaterally.  01/05/2014  Maudry Mayhew, RVT, RDCS, RDMS

## 2014-01-05 NOTE — Progress Notes (Signed)
SLP Cancellation Note  Patient Details Name: [name] [name] MRN: [id number] DOB: 05/16/1924   Cancelled treatment:       Reason Eval/Treat Not Completed: Patient at procedure or test/unavailable.  Cary, Carlin [phone]    Danielle Lento Meryl 01/05/2014, 2:01 PM

## 2014-01-05 NOTE — Progress Notes (Signed)
OT Cancellation Note  Patient Details Name: [name] [name] MRN: [id number] DOB: Sep 10, 1924   Cancelled Treatment:    Reason Eval/Treat Not Completed: Patient at procedure or test/ unavailable Will check back tomorrow 01/06/14  Keriana Sarsfield OTR/L 01/05/2014, 2:29 PM

## 2014-01-05 NOTE — Care Management Note (Addendum)
    Page 1 of 2   01/07/2014     2:35:51 PM CARE MANAGEMENT NOTE 01/07/2014  Patient:  [name] [name]   Account Number:  [id number]  Date Initiated:  01/05/2014  Documentation initiated by:  GRAVES-BIGELOW,BRENDA  Subjective/Objective Assessment:   Pt admitted for R arm numbness, slurred speech. Pt has family support.     Action/Plan:   CM will monitor for disposition needs.   Anticipated DC Date:  01/06/2014   Anticipated DC Plan:  [place]  CM consult      [facility] Choice  HOME HEALTH   Choice offered to / List presented to:  C-1 Patient   DME arranged  NA      DME agency  NA     [place] arranged  HH-1 RN  [place]   Status of service:  Completed, signed off Medicare Important Message given?  YES (If response is "NO", the following Medicare IM given date fields will be blank) Date Medicare IM given:  01/05/2014 Medicare IM given by:  GRAVES-BIGELOW,BRENDA Date Additional Medicare IM given:   Additional Medicare IM given by:    Discharge Disposition:  [place]  Per UR Regulation:  Reviewed for med. necessity/level of care/duration of stay  If discussed at [facility] of Stay Meetings, dates discussed:    Comments:  01/07/14 14;00 Cm received call from [name] [name] who states they cannot accomodate needs of pt bc of staffing shortage. CM called pt to see if I could arrange for [facility] with another agency and pt staes she does not need any HH services and just cancel request.  No other CM needs were communicated. [name] [name], [name] [name].  01/06/14 16:55 Cm spoke with pt on phone who is chooses [facility] to render her HHPT/RN/SLP (though she denies needing any speech therapy). Address and contact information verified with pt.  CM emailed [name] [name] with referral.  No DME needed.  No other CM needs were communicated.  [name] [name], BSN, [employer].

## 2014-01-05 NOTE — Plan of Care (Signed)
Problem: Consults Goal: Skin Care Protocol Initiated - if Braden Score 18 or less If consults are not indicated, leave blank or document N/A Outcome: Not Applicable Date Met:  12/82/08 Braden scale > 18

## 2014-01-05 NOTE — Progress Notes (Signed)
[facility] PROGRESS NOTE  [name] [name] FAO:[id number] DOB: 05/22/1924 DOA: 01/04/2014 PCP: Marjorie Smolder, MD Interim summary: [name] [name] is a 80 y.o. female with PMH of HTN, DJD, Recent diverticulitis presented As code stroke after she noticed a sudden onset of right arm weakness, decreased sensation and inability to keep food in the right side of her mouth. On initial arrival patient had a right facial droop, dysarthria but no arm weakness. Pt was evaluated by neurology, TPA was not administered due to minimal symptoms . She was found to have an acute CVA IN the left frontal area.   Assessment/Plan: Acute left frontal CVA: Stroke work up in progress.  Neurology consultation appreciated and on aspirin and zocor.  Awaiting therapy evals.    Accelerated hypertension: Prn hydralazine.   DVT prophylaxis.   Code Status: full code.  Family Communication:daughter at bedside Disposition Plan: pending.    Consultants:  Neurology  Physical therapy  Speech therapy  Procedures:  Echocardiogram  Carotid duplex  MRI and  MRA HEAD AND NECK.   Antibiotics:  none  HPI/Subjective: Comfortable.   Objective: Filed Vitals:   01/05/14 1129  BP: 191/74  Pulse: 64  Temp: 98.2 F (36.8 C)  Resp: 18    Intake/Output Summary (Last 24 hours) at 01/05/14 1555 Last data filed at 01/05/14 0900  Gross per 24 hour  Intake    360 ml  Output    850 ml  Net   -490 ml   There were no vitals filed for this visit.  Exam:   General:  Alert afebrile comfortable  Cardiovascular: s1s2  Respiratory: ctab  Abdomen: soft NT NDBS+  Musculoskeletal: no pedal edema.  Data Reviewed: Basic Metabolic Panel:  Recent Labs Lab 01/01/14 1518 01/04/14 1419 01/04/14 1431  NA 138 140 138  K 4.1 4.3 4.1  CL 101 100 100  CO2 28 27  --   GLUCOSE 82 116* 117*  BUN 17 15 17   CREATININE 0.6 0.60 0.70  CALCIUM 9.0 9.4  --    Liver Function Tests: No results found for this  basename: AST, ALT, ALKPHOS, BILITOT, PROT, ALBUMIN,  in the last 168 hours No results found for this basename: LIPASE, AMYLASE,  in the last 168 hours No results found for this basename: AMMONIA,  in the last 168 hours CBC:  Recent Labs Lab 01/01/14 1518 01/04/14 1419 01/04/14 1431  WBC 6.6 5.4  --   NEUTROABS 3.7  --   --   HGB 13.9 14.6 15.6*  HCT 42.3 44.0 46.0  MCV 92.9 93.2  --   PLT 208.0 191  --    Cardiac Enzymes:  Recent Labs Lab 01/04/14 1419  TROPONINI <0.30   BNP (last 3 results) No results found for this basename: PROBNP,  in the last 8760 hours CBG: No results found for this basename: GLUCAP,  in the last 168 hours  No results found for this or any previous visit (from the past 240 hour(s)).   Studies: Dg Chest 2 View  01/04/2014   CLINICAL DATA:  Stroke.  Initial encounter.  EXAM: CHEST  2 VIEW  COMPARISON:  12/23/2012  FINDINGS: There is mild cardiomegaly which is chronic. Mediastinal contours are stable from previous, with apparent upper mediastinal widening related to apical lordotic positioning. There is no edema, consolidation, effusion, or pneumothorax.  IMPRESSION: No active cardiopulmonary disease.   Electronically Signed   By: Jorje Guild M.D.   On: 01/04/2014 23:27   Ct Head Wo Contrast  01/04/2014   CLINICAL DATA:  Right-sided facial droop, slurred speech.  EXAM: CT HEAD WITHOUT CONTRAST  TECHNIQUE: Contiguous axial images were obtained from the base of the skull through the vertex without intravenous contrast.  COMPARISON:  CT scan of October 12, 2007.  FINDINGS: Bony calvarium appears intact. Mild diffuse cortical atrophy is noted. Mild chronic ischemic white matter disease is noted. No mass effect or midline shift is noted. Ventricular size is within normal limits. There is no evidence of mass lesion, hemorrhage or acute infarction.  IMPRESSION: Mild diffuse cortical atrophy. Mild chronic ischemic white matter disease. No acute intracranial  abnormality seen. Critical Value/emergent results were called by telephone at the time of interpretation on 01/04/2014 at 2:38 pm to Dr. Noemi Chapel, who verbally acknowledged these results.   Electronically Signed   By: Sabino Dick M.D.   On: 01/04/2014 14:39   Mr Brain Wo Contrast  01/04/2014   CLINICAL DATA:  Initial evaluation for a sudden onset right arm weakness. Evaluate for stroke.  EXAM: MRI HEAD WITHOUT CONTRAST  MRA HEAD WITHOUT CONTRAST  TECHNIQUE: Multiplanar, multiecho pulse sequences of the brain and surrounding structures were obtained without intravenous contrast. Angiographic images of the head were obtained using MRA technique without contrast.  COMPARISON:  Prior CT performed earlier on the same day.  FINDINGS: MRI HEAD FINDINGS  There is a wedge-shaped area of restricted diffusion involving the cortical gray matter of the high left frontal lobe, compatible with acute ischemic infarct (series 4, image 27). This area of infarction measures 9.1 x 15.3 mm, and appears to involve the motor cortex. No associated hemorrhage or significant mass effect. No other intracranial hemorrhage. Gray-white matter differentiation is otherwise maintained.  No mass lesion or midline shift. Patchy T2/FLAIR hyperintensity within the periventricular and deep white matter both cerebral hemispheres is most compatible with chronic small vessel ischemic changes, moderate for patient age. Diffuse prominence of the CSF containing spaces is compatible with generalized atrophy. Ventricles are within normal limits without evidence of hydrocephalus. No extra-axial fluid collection.  Craniocervical junction within normal limits. Pituitary gland unremarkable.  Chronic changes seen at the left globe. No acute abnormality seen about either orbit.  Signal intensity within the visualized bone marrow is normal. Mild degenerative changes noted within the upper cervical spine.  Paranasal sinuses and mastoid air cells are clear.   MRA HEAD FINDINGS  ANTERIOR CIRCULATION:  Visualized portions of the distal cervical segments of the internal carotid arteries are widely patent with antegrade flow. The petrous, cavernous, and supra clinoid segments are widely patent without hemodynamically significant stenosis.  A1 segments, anterior communicating artery, and anterior cerebral arteries are widely patent without high-grade stenosis or acute abnormality.  M1 segments are widely patent bilaterally without proximal branch occlusion or significant stenosis. MCA bifurcations within normal limits. Distal MCA branches patent bilaterally.  POSTERIOR CIRCULATION:  Left vertebral artery is dominant. Posterior inferior cerebral arteries patent bilaterally. Vertebrobasilar junction and basilar artery within normal limits without high-grade stenosis or proximal branch occlusion. Superior cerebral arteries patent bilaterally. The left superior cerebral artery appears to arise from the left P1 segment. There is a single short segment moderate stenosis within the proximal right posterior cerebral artery.  No aneurysm or vascular malformation identified within the intracranial circulation.  IMPRESSION: MRI HEAD IMPRESSION:  1. Acute ischemic infarct involving the cortical gray matter of the high left frontal lobe as above. No associated mass effect or hemorrhage. 2. Atrophy with moderate chronic microvascular ischemic disease.  MRA  HEAD IMPRESSION:  1. No proximal branch occlusion identified within the intracranial circulation. 2. Single moderate short-segment stenosis within the proximal right posterior cerebral artery. No other hemodynamically significant stenosis identified within the intracranial circulation.   Electronically Signed   By: Jeannine Boga M.D.   On: 01/04/2014 23:36   Mr Jodene Nam Head/brain Wo Cm  01/04/2014   CLINICAL DATA:  Initial evaluation for a sudden onset right arm weakness. Evaluate for stroke.  EXAM: MRI HEAD WITHOUT CONTRAST  MRA  HEAD WITHOUT CONTRAST  TECHNIQUE: Multiplanar, multiecho pulse sequences of the brain and surrounding structures were obtained without intravenous contrast. Angiographic images of the head were obtained using MRA technique without contrast.  COMPARISON:  Prior CT performed earlier on the same day.  FINDINGS: MRI HEAD FINDINGS  There is a wedge-shaped area of restricted diffusion involving the cortical gray matter of the high left frontal lobe, compatible with acute ischemic infarct (series 4, image 27). This area of infarction measures 9.1 x 15.3 mm, and appears to involve the motor cortex. No associated hemorrhage or significant mass effect. No other intracranial hemorrhage. Gray-white matter differentiation is otherwise maintained.  No mass lesion or midline shift. Patchy T2/FLAIR hyperintensity within the periventricular and deep white matter both cerebral hemispheres is most compatible with chronic small vessel ischemic changes, moderate for patient age. Diffuse prominence of the CSF containing spaces is compatible with generalized atrophy. Ventricles are within normal limits without evidence of hydrocephalus. No extra-axial fluid collection.  Craniocervical junction within normal limits. Pituitary gland unremarkable.  Chronic changes seen at the left globe. No acute abnormality seen about either orbit.  Signal intensity within the visualized bone marrow is normal. Mild degenerative changes noted within the upper cervical spine.  Paranasal sinuses and mastoid air cells are clear.  MRA HEAD FINDINGS  ANTERIOR CIRCULATION:  Visualized portions of the distal cervical segments of the internal carotid arteries are widely patent with antegrade flow. The petrous, cavernous, and supra clinoid segments are widely patent without hemodynamically significant stenosis.  A1 segments, anterior communicating artery, and anterior cerebral arteries are widely patent without high-grade stenosis or acute abnormality.  M1 segments  are widely patent bilaterally without proximal branch occlusion or significant stenosis. MCA bifurcations within normal limits. Distal MCA branches patent bilaterally.  POSTERIOR CIRCULATION:  Left vertebral artery is dominant. Posterior inferior cerebral arteries patent bilaterally. Vertebrobasilar junction and basilar artery within normal limits without high-grade stenosis or proximal branch occlusion. Superior cerebral arteries patent bilaterally. The left superior cerebral artery appears to arise from the left P1 segment. There is a single short segment moderate stenosis within the proximal right posterior cerebral artery.  No aneurysm or vascular malformation identified within the intracranial circulation.  IMPRESSION: MRI HEAD IMPRESSION:  1. Acute ischemic infarct involving the cortical gray matter of the high left frontal lobe as above. No associated mass effect or hemorrhage. 2. Atrophy with moderate chronic microvascular ischemic disease.  MRA HEAD IMPRESSION:  1. No proximal branch occlusion identified within the intracranial circulation. 2. Single moderate short-segment stenosis within the proximal right posterior cerebral artery. No other hemodynamically significant stenosis identified within the intracranial circulation.   Electronically Signed   By: Jeannine Boga M.D.   On: 01/04/2014 23:36    Scheduled Meds: . aspirin  325 mg Oral Daily  . enoxaparin (LOVENOX) injection  40 mg Subcutaneous Q24H  . prednisoLONE acetate  1 drop Both Eyes BID  . simvastatin  20 mg Oral q1800   Continuous Infusions:  Active Problems:   Aphasia   Paresthesia   Acute CVA (cerebrovascular accident)    Time spent: 25 minutes.     [facility] Pager [phone]. If 7PM-7AM, please contact night-coverage at [email or web address], password [facility] 01/05/2014, 3:55 PM  LOS: 1 day

## 2014-01-06 MED ORDER — SIMVASTATIN 20 MG PO TABS: 20.0000 mg | ORAL_TABLET | Freq: Every day | ORAL | Status: DC

## 2014-01-06 MED ORDER — ASPIRIN 325 MG PO TABS: 325.0000 mg | ORAL_TABLET | Freq: Every day | ORAL | Status: DC

## 2014-01-06 NOTE — Discharge Summary (Signed)
Physician Discharge Summary  [name] [name] MPN:[id number] DOB: March 12, 1925 DOA: 01/04/2014  PCP: Marjorie Smolder, MD  Admit date: 01/04/2014 Discharge date: 01/06/2014  Time spent: 30 minutes  Recommendations for Outpatient Follow-up:  Follow up with PCP in one week Follow up with neurology as recommended Follow up with EP as recommended.    Discharge Diagnoses:  Active Problems:   Aphasia   Paresthesia   Acute CVA (cerebrovascular accident) hypertension.   Discharge Condition: improved  Diet recommendation: low sodium diet.   Filed Weights   01/06/14 0500  Weight: 76.431 kg (168 lb 8 oz)    History of present illness:   [name] [name] is a 80 y.o. female with PMH of HTN, DJD, Recent diverticulitis presented As code stroke after she noticed a sudden onset of right arm weakness, decreased sensation and inability to keep food in the right side of her mouth. On initial arrival patient had a right facial droop, dysarthria but no arm weakness. Pt was evaluated by neurology, TPA was not administered due to minimal symptoms . She was found to have an acute CVA IN the left frontal area.   Hospital Course:   Acute left frontal CVA:  Stroke work up DONE.  Neurology consultation appreciated and on aspirin and zocor.  Therapy eval recommended home health PT. Loop recorder placed for evaluation of cryptogenic stroke. Accelerated hypertension:  RESOLVED.  Procedures:  LOOP RECORDER PLACEMENT.     Consultations:  Neurology   EP  Discharge Exam: Filed Vitals:   01/06/14 1438  BP: 144/74  Pulse: 69  Temp: 98.2 F (36.8 C)  Resp: 18    General: alert afebrile comfortable Cardiovascular: s1s2 Respiratory: ctab  Discharge Instructions You were cared for by a hospitalist during your hospital stay. If you have any questions about your discharge medications or the care you received while you were in the hospital after you are discharged, you can call the unit and asked to  speak with the hospitalist on call if the hospitalist that took care of you is not available. Once you are discharged, your primary care physician will handle any further medical issues. Please note that NO REFILLS for any discharge medications will be authorized once you are discharged, as it is imperative that you return to your primary care physician (or establish a relationship with a primary care physician if you do not have one) for your aftercare needs so that they can reassess your need for medications and monitor your lab values.  Discharge Instructions   Discharge instructions    Complete by:  As directed   Follow up with PCP AND neurology as recommended          Current Discharge Medication List    START taking these medications   Details  aspirin 325 MG tablet Take 1 tablet (325 mg total) by mouth daily. Qty: 60 tablet, Refills: 1    simvastatin (ZOCOR) 20 MG tablet Take 1 tablet (20 mg total) by mouth daily at 6 PM. Qty: 30 tablet, Refills: 1   Associated Diagnoses: Acute CVA (cerebrovascular accident)      CONTINUE these medications which have NOT CHANGED   Details  Melatonin 1 MG SUBL Place 1 mg under the tongue at bedtime.    Multiple Vitamin (MULTI VITAMIN DAILY PO) Take 1 tablet by mouth every morning.    nebivolol (BYSTOLIC) 5 MG tablet Take 1 tablet (5 mg total) by mouth 2 (two) times daily. Qty: 60 tablet, Refills: 6   Associated Diagnoses: Dizziness;  Essential hypertension, malignant    Polyethyl Glycol-Propyl Glycol (SYSTANE OP) Place 1 drop into both eyes as needed (for itching eyes).    prednisoLONE acetate (PRED FORTE) 1 % ophthalmic suspension Place 1 drop into both eyes 2 (two) times daily.       STOP taking these medications     aspirin EC 81 MG tablet        Allergies  Allergen Reactions  . Ultram [Tramadol] Hives  . Buprenex [Buprenorphine Hcl] Other (See Comments)    Heart raced  . Augmentin [Amoxicillin-Pot Clavulanate] Nausea And  Vomiting  . Levofloxacin Other (See Comments)    levaquin unknown reaction  . Percocet [Oxycodone-Acetaminophen] Itching and Other (See Comments)    "felt drained"  . Sulfa Antibiotics Other (See Comments)    unknown  . Tape Other (See Comments)    Adhesive tape causes skin to pull off  . Zetia [Ezetimibe] Other (See Comments)    unknown   Follow-up Information   Follow up with Marjorie Smolder, MD. Schedule an appointment as soon as possible for a visit in 1 week.   Specialty:  Family Medicine   Contact information:   [address] [place] [address] [phone]       Follow up with Xu,Jindong, MD In 2 months.   Specialty:  Neurology   Contact information:   [address] [place] [address] [phone]       Follow up with Thompson Grayer, MD. Call in 4 weeks.   Specialty:  Cardiology   Contact information:   [place] [address] [phone]        The results of significant diagnostics from this hospitalization (including imaging, microbiology, ancillary and laboratory) are listed below for reference.    Significant Diagnostic Studies: Dg Chest 2 View  01/04/2014   CLINICAL DATA:  Stroke.  Initial encounter.  EXAM: CHEST  2 VIEW  COMPARISON:  12/23/2012  FINDINGS: There is mild cardiomegaly which is chronic. Mediastinal contours are stable from previous, with apparent upper mediastinal widening related to apical lordotic positioning. There is no edema, consolidation, effusion, or pneumothorax.  IMPRESSION: No active cardiopulmonary disease.   Electronically Signed   By: Jorje Guild M.D.   On: 01/04/2014 23:27   Ct Head Wo Contrast  01/04/2014   CLINICAL DATA:  Right-sided facial droop, slurred speech.  EXAM: CT HEAD WITHOUT CONTRAST  TECHNIQUE: Contiguous axial images were obtained from the base of the skull through the vertex without intravenous contrast.  COMPARISON:  CT scan of October 12, 2007.   FINDINGS: Bony calvarium appears intact. Mild diffuse cortical atrophy is noted. Mild chronic ischemic white matter disease is noted. No mass effect or midline shift is noted. Ventricular size is within normal limits. There is no evidence of mass lesion, hemorrhage or acute infarction.  IMPRESSION: Mild diffuse cortical atrophy. Mild chronic ischemic white matter disease. No acute intracranial abnormality seen. Critical Value/emergent results were called by telephone at the time of interpretation on 01/04/2014 at 2:38 pm to Dr. Noemi Chapel, who verbally acknowledged these results.   Electronically Signed   By: Sabino Dick M.D.   On: 01/04/2014 14:39   Mr Brain Wo Contrast  01/04/2014   CLINICAL DATA:  Initial evaluation for a sudden onset right arm weakness. Evaluate for stroke.  EXAM: MRI HEAD WITHOUT CONTRAST  MRA HEAD WITHOUT CONTRAST  TECHNIQUE: Multiplanar, multiecho pulse sequences of the brain and surrounding structures were  obtained without intravenous contrast. Angiographic images of the head were obtained using MRA technique without contrast.  COMPARISON:  Prior CT performed earlier on the same day.  FINDINGS: MRI HEAD FINDINGS  There is a wedge-shaped area of restricted diffusion involving the cortical gray matter of the high left frontal lobe, compatible with acute ischemic infarct (series 4, image 27). This area of infarction measures 9.1 x 15.3 mm, and appears to involve the motor cortex. No associated hemorrhage or significant mass effect. No other intracranial hemorrhage. Gray-white matter differentiation is otherwise maintained.  No mass lesion or midline shift. Patchy T2/FLAIR hyperintensity within the periventricular and deep white matter both cerebral hemispheres is most compatible with chronic small vessel ischemic changes, moderate for patient age. Diffuse prominence of the CSF containing spaces is compatible with generalized atrophy. Ventricles are within normal limits without evidence  of hydrocephalus. No extra-axial fluid collection.  Craniocervical junction within normal limits. Pituitary gland unremarkable.  Chronic changes seen at the left globe. No acute abnormality seen about either orbit.  Signal intensity within the visualized bone marrow is normal. Mild degenerative changes noted within the upper cervical spine.  Paranasal sinuses and mastoid air cells are clear.  MRA HEAD FINDINGS  ANTERIOR CIRCULATION:  Visualized portions of the distal cervical segments of the internal carotid arteries are widely patent with antegrade flow. The petrous, cavernous, and supra clinoid segments are widely patent without hemodynamically significant stenosis.  A1 segments, anterior communicating artery, and anterior cerebral arteries are widely patent without high-grade stenosis or acute abnormality.  M1 segments are widely patent bilaterally without proximal branch occlusion or significant stenosis. MCA bifurcations within normal limits. Distal MCA branches patent bilaterally.  POSTERIOR CIRCULATION:  Left vertebral artery is dominant. Posterior inferior cerebral arteries patent bilaterally. Vertebrobasilar junction and basilar artery within normal limits without high-grade stenosis or proximal branch occlusion. Superior cerebral arteries patent bilaterally. The left superior cerebral artery appears to arise from the left P1 segment. There is a single short segment moderate stenosis within the proximal right posterior cerebral artery.  No aneurysm or vascular malformation identified within the intracranial circulation.  IMPRESSION: MRI HEAD IMPRESSION:  1. Acute ischemic infarct involving the cortical gray matter of the high left frontal lobe as above. No associated mass effect or hemorrhage. 2. Atrophy with moderate chronic microvascular ischemic disease.  MRA HEAD IMPRESSION:  1. No proximal branch occlusion identified within the intracranial circulation. 2. Single moderate short-segment stenosis within  the proximal right posterior cerebral artery. No other hemodynamically significant stenosis identified within the intracranial circulation.   Electronically Signed   By: Jeannine Boga M.D.   On: 01/04/2014 23:36   Ct Abdomen Pelvis W Contrast  01/02/2014   CLINICAL DATA:  Recent diverticulitis. Left lower quadrant abdominal pain. Patient on second round of antibiotics.  EXAM: CT ABDOMEN AND PELVIS WITH CONTRAST  TECHNIQUE: Multidetector CT imaging of the abdomen and pelvis was performed using the standard protocol following bolus administration of intravenous contrast.  CONTRAST:  158mL OMNIPAQUE IOHEXOL 300 MG/ML  SOLN  COMPARISON:  01/30/2011  FINDINGS: Lower chest: The lung bases are clear. The heart is normal in size. No pericardial effusion. Coronary artery calcifications are noted.  Hepatobiliary: Mild central intrahepatic and mild common bile duct dilatation likely due to prior cholecystectomy. No focal hepatic lesions.  Pancreas: Normal.  Spleen: Normal.  Adrenals/Urinary Tract: Normal.  Stomach/Bowel: The stomach, duodenum, small bowel and colon are grossly normal. No inflammatory changes, mass lesions or obstructive findings. Moderate diverticulosis of  the sigmoid colon. There is a tiny wisp of fluid near the upper sigmoid colon which may be the sequela of prior diverticulitis. The appendix is normal.  Vascular/Lymphatic: Moderate atherosclerotic calcifications involving the aorta but no focal aneurysm or dissection. The branch vessels are patent. No mesenteric or retroperitoneal mass or lymphadenopathy. Small scattered lymph nodes are noted.  Pelvis: The uterus is surgically absent. The right ovary is still present. The left ovary is not identified for certain. The bladder is normal except for small cystocele. No inguinal mass or adenopathy.  Musculoskeletal: No significant bony findings.  IMPRESSION: No findings for acute diverticulitis. Small wisp of fluid near the upper sigmoid colon is  likely the sequela of resolved diverticulitis.  Moderate stool in the ascending and transverse colon.  Small cystocele.   Electronically Signed   By: Kalman Jewels M.D.   On: 01/02/2014 16:02   Mr Jodene Nam Head/brain Wo Cm  01/04/2014   CLINICAL DATA:  Initial evaluation for a sudden onset right arm weakness. Evaluate for stroke.  EXAM: MRI HEAD WITHOUT CONTRAST  MRA HEAD WITHOUT CONTRAST  TECHNIQUE: Multiplanar, multiecho pulse sequences of the brain and surrounding structures were obtained without intravenous contrast. Angiographic images of the head were obtained using MRA technique without contrast.  COMPARISON:  Prior CT performed earlier on the same day.  FINDINGS: MRI HEAD FINDINGS  There is a wedge-shaped area of restricted diffusion involving the cortical gray matter of the high left frontal lobe, compatible with acute ischemic infarct (series 4, image 27). This area of infarction measures 9.1 x 15.3 mm, and appears to involve the motor cortex. No associated hemorrhage or significant mass effect. No other intracranial hemorrhage. Gray-white matter differentiation is otherwise maintained.  No mass lesion or midline shift. Patchy T2/FLAIR hyperintensity within the periventricular and deep white matter both cerebral hemispheres is most compatible with chronic small vessel ischemic changes, moderate for patient age. Diffuse prominence of the CSF containing spaces is compatible with generalized atrophy. Ventricles are within normal limits without evidence of hydrocephalus. No extra-axial fluid collection.  Craniocervical junction within normal limits. Pituitary gland unremarkable.  Chronic changes seen at the left globe. No acute abnormality seen about either orbit.  Signal intensity within the visualized bone marrow is normal. Mild degenerative changes noted within the upper cervical spine.  Paranasal sinuses and mastoid air cells are clear.  MRA HEAD FINDINGS  ANTERIOR CIRCULATION:  Visualized portions of the  distal cervical segments of the internal carotid arteries are widely patent with antegrade flow. The petrous, cavernous, and supra clinoid segments are widely patent without hemodynamically significant stenosis.  A1 segments, anterior communicating artery, and anterior cerebral arteries are widely patent without high-grade stenosis or acute abnormality.  M1 segments are widely patent bilaterally without proximal branch occlusion or significant stenosis. MCA bifurcations within normal limits. Distal MCA branches patent bilaterally.  POSTERIOR CIRCULATION:  Left vertebral artery is dominant. Posterior inferior cerebral arteries patent bilaterally. Vertebrobasilar junction and basilar artery within normal limits without high-grade stenosis or proximal branch occlusion. Superior cerebral arteries patent bilaterally. The left superior cerebral artery appears to arise from the left P1 segment. There is a single short segment moderate stenosis within the proximal right posterior cerebral artery.  No aneurysm or vascular malformation identified within the intracranial circulation.  IMPRESSION: MRI HEAD IMPRESSION:  1. Acute ischemic infarct involving the cortical gray matter of the high left frontal lobe as above. No associated mass effect or hemorrhage. 2. Atrophy with moderate chronic microvascular ischemic disease.  MRA HEAD IMPRESSION:  1. No proximal branch occlusion identified within the intracranial circulation. 2. Single moderate short-segment stenosis within the proximal right posterior cerebral artery. No other hemodynamically significant stenosis identified within the intracranial circulation.   Electronically Signed   By: Jeannine Boga M.D.   On: 01/04/2014 23:36    Microbiology: No results found for this or any previous visit (from the past 240 hour(s)).   Labs: Basic Metabolic Panel:  Recent Labs Lab 01/01/14 1518 01/04/14 1419 01/04/14 1431  NA 138 140 138  K 4.1 4.3 4.1  CL 101 100 100   CO2 28 27  --   GLUCOSE 82 116* 117*  BUN 17 15 17   CREATININE 0.6 0.60 0.70  CALCIUM 9.0 9.4  --    Liver Function Tests: No results found for this basename: AST, ALT, ALKPHOS, BILITOT, PROT, ALBUMIN,  in the last 168 hours No results found for this basename: LIPASE, AMYLASE,  in the last 168 hours No results found for this basename: AMMONIA,  in the last 168 hours CBC:  Recent Labs Lab 01/01/14 1518 01/04/14 1419 01/04/14 1431  WBC 6.6 5.4  --   NEUTROABS 3.7  --   --   HGB 13.9 14.6 15.6*  HCT 42.3 44.0 46.0  MCV 92.9 93.2  --   PLT 208.0 191  --    Cardiac Enzymes:  Recent Labs Lab 01/04/14 1419  TROPONINI <0.30   BNP: BNP (last 3 results) No results found for this basename: PROBNP,  in the last 8760 hours CBG: No results found for this basename: GLUCAP,  in the last 168 hours     Signed:  Sharyn Brilliant  [facility] 01/06/2014, 3:58 PM

## 2014-01-06 NOTE — Progress Notes (Signed)
Discharged home with family; to have home health services set up with [facility] per patient request. Daughter and Son present at the time of D/C. Prescription faxed to [facility] @ [phone] chosen by patient and family. Transferred out of unit via wheelchair awake, alert, and oriented.

## 2014-01-06 NOTE — Evaluation (Signed)
Speech Language Pathology Evaluation Patient Details Name: [name] [name] MRN: [id number] DOB: [birth year] Today's Date: 01/06/2014 Time: 0210-0240 SLP Time Calculation (min): 30 min  Problem List:  Patient Active Problem List   Diagnosis Date Noted  . Aphasia 01/04/2014  . Paresthesia 01/04/2014  . Acute CVA (cerebrovascular accident) 01/04/2014  . LLQ abdominal pain 01/01/2014  . Generalized abdominal pain 01/01/2014  . History of diverticulitis 01/01/2014  . Diverticulitis of colon without hemorrhage 12/07/2013  . Diaphoresis 12/29/2012  . Anemia 12/28/2012  . Sepsis 12/24/2012  . Cellulitis of leg, right 12/24/2012  . Pyuria 12/24/2012  . Hypoxemia 12/24/2012  . Acute on chronic diastolic heart failure 30/16/0109  . Postoperative anemia due to acute blood loss 12/15/2012  . Sleep disturbance 10/03/2012  . Abdominal pulsatile mass 08/24/2012  . Depression 07/12/2012  . OA (osteoarthritis) of knee 06/27/2012  . SVT (supraventricular tachycardia) 12/15/2011   Past Medical History:  Past Medical History  Diagnosis Date  . Arthritis   . Blind left eye     Corneal transplant x3 with rejection  . Dysrhythmia   . Varicose veins     both legs and left fooot at arch  . Complication of anesthesia     sensitive to meds, bp can fall  . SVT (supraventricular tachycardia) 12/15/2011    seen by Dr. Caryl Comes  . Helicobacter pylori (H. pylori) 05/22/02    RUT-Positive  . Gastritis   . Diverticulosis    Past Surgical History:  Past Surgical History  Procedure Laterality Date  . Cholecystectomy    . Tonsillectomy    . Tonsillectomy    . Corneal transplant  right 2009, left 2009    both eyes, 3 in left eye, 1 in right eye  . Cataract surgery  2005    both eyes  . Abdominal hysterectomy   sept 1985    ovaries removed  . Total knee arthroplasty Left 06/27/2012    Procedure: LEFT TOTAL KNEE ARTHROPLASTY;  Surgeon: Gearlean Alf, MD;  Location: [facility]S;  Service: Orthopedics;   Laterality: Left;  . Tubal ligation    . Total knee arthroplasty Right 12/12/2012    Procedure: RIGHT TOTAL KNEE ARTHROPLASTY;  Surgeon: Gearlean Alf, MD;  Location: [facility] ORS;  Service: Orthopedics;  Laterality: Right;   HPI:  80 y.o. female with PMH of HTN, DJD, Recent diverticulitis presented As code stroke after she noticed a sudden onset of right arm weakness, decreased sensation and inability to keep food in the right side of her mouth. Symptoms noted to be resolving. NAT:FTDDU ischemic infarct involving the cortical gray matter of the high left frontal lobe.  Pt and family feel she has returned to baseline with functioning, especially with speech production   Assessment / Plan / Recommendation Clinical Impression  Pt presents with no deficits in communication (expressive/receptive language); dysarthria present at beginning of hospital stay, but has resolved to conversational level with complex conversational tasks; auditory comprehension tasks [facility] and cognition appears [facility] as well; OME adequate with no difficulties noted; pt unable to participate with reading/writing tasks d/t limited vision (L blindness; right limited vision requiring magnifying glass to read text); writing not assessed, but pt stated she has "full use of her right hand", which was limited initially upon hospital admission.  No ST recommended at this time, but pt/family were given the option to contact [facility] ST prn if speech difficulties persist/return once discharged home.    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 Cognitive Status: Within Advertising copywriter for tasks assessed Arousal/Alertness: Awake/alert Orientation Level: Oriented X4 Memory: Appears intact Awareness: Appears intact Problem Solving: Appears intact Safety/Judgment: Appears intact    Comprehension  Auditory Comprehension Overall Auditory Comprehension: Appears within functional limits for tasks assessed Reading Comprehension Reading Status: Unable to assess (comment) (left blindness; uses magnifying glass with R eye)    Expression Expression Primary Mode of Expression: Verbal Verbal Expression Overall Verbal Expression: Appears within functional limits for tasks assessed Initiation: No impairment Level of Generative/Spontaneous Verbalization: Conversation Repetition: No impairment Naming: No impairment Pragmatics: No impairment Non-Verbal Means of Communication: Not applicable Written Expression Dominant Hand: Right Written Expression: Not tested   Oral / Motor Oral Motor/Sensory Function Overall Oral Motor/Sensory Function: Appears within functional limits for tasks assessed Motor Speech Overall Motor Speech: Appears within functional limits for tasks assessed Respiration: Within functional limits Phonation: Normal Resonance: Within functional limits Articulation: Within functional limitis Intelligibility: Intelligible Motor Planning: Witnin functional limits Motor Speech Errors: Not applicable        Consuella Scurlock,PAT, M.S., CCC-SLP 01/06/2014, 3:07 PM

## 2014-01-11 ENCOUNTER — Telehealth: Payer: Self-pay | Admitting: Cardiovascular Disease

## 2014-01-11 ENCOUNTER — Ambulatory Visit: Payer: Medicare Other | Admitting: Podiatrist

## 2014-01-11 NOTE — Telephone Encounter (Signed)
New message           Pt calling with questions about recent medication change

## 2014-01-11 NOTE — Telephone Encounter (Signed)
01/05/14 Successful implantation of a [employer] Reveal LINQ implantable loop recorder for cryptogenic stroke.  The pt's daughter would like to get more information about how the [facility] device is monitored. I will forward this message to the device clinic to follow up with [facility] tomorrow.    [name] also said that when the pt was hospitalized they told her not to take bystolic when her BP was low.  The pt's daughter would like to know what is considered low.  I advised her to hold Bystolic when SBP is <159.

## 2014-01-12 NOTE — Telephone Encounter (Signed)
Updated [name] [name] remote is automatic; daily manual transmissions are not necessary. Let her know two bars is sufficient for reception. Explained power outages will not affect her [facility]. Let her know if a manual transmission is sent due to symptoms, we will only call if concern or clarification is needed. In the event an arrhythmia is discovered, we will decide if appt w/ Dr. Rayann Heman is necessary thereafter.

## 2014-01-12 NOTE — Telephone Encounter (Signed)
LMVOM w/ [name] w/ my direct #.

## 2014-01-14 ENCOUNTER — Encounter (HOSPITAL_COMMUNITY): Payer: Self-pay | Admitting: Cardiovascular Disease

## 2014-01-17 ENCOUNTER — Encounter (HOSPITAL_COMMUNITY): Payer: Self-pay | Admitting: Cardiovascular Disease

## 2014-01-17 ENCOUNTER — Ambulatory Visit (INDEPENDENT_AMBULATORY_CARE_PROVIDER_SITE_OTHER): Payer: Medicare Other | Admitting: *Deleted

## 2014-01-17 DIAGNOSIS — I639 Cerebral infarction, unspecified: Secondary | ICD-10-CM

## 2014-01-17 LAB — MDC_IDC_ENUM_SESS_TYPE_INCLINIC
Date Time Interrogation Session: 20151028161853
MDC IDC SET ZONE DETECTION INTERVAL: 3000 ms
Zone Setting Detection Interval: 2000 ms
Zone Setting Detection Interval: 420 ms

## 2014-01-17 NOTE — Progress Notes (Signed)
Wound check appointment for ILR. Steri-strips removed. Wound without redness or edema. Incision edges approximated, wound well healed. Pt with 0 tachy episodes; 0 brady episodes; 0 asystole.  Carelink summary reports qmo & ROV w/ Dr. Rayann Heman PRN.

## 2014-01-19 ENCOUNTER — Inpatient Hospital Stay (HOSPITAL_COMMUNITY)
Admission: EM | Admit: 2014-01-19 | Discharge: 2014-01-22 | DRG: 281 | Disposition: A | Discharge status: Home / Self Care | Payer: Medicare Other | Attending: Internal Medicine | Admitting: Internal Medicine

## 2014-01-19 ENCOUNTER — Encounter (HOSPITAL_COMMUNITY): Payer: Self-pay | Admitting: Emergency Medicine

## 2014-01-19 ENCOUNTER — Other Ambulatory Visit: Payer: Self-pay

## 2014-01-19 ENCOUNTER — Emergency Department (HOSPITAL_COMMUNITY): Payer: Medicare Other

## 2014-01-19 DIAGNOSIS — I471 Supraventricular tachycardia: Secondary | ICD-10-CM | POA: Diagnosis present

## 2014-01-19 DIAGNOSIS — Z90710 Acquired absence of both cervix and uterus: Secondary | ICD-10-CM

## 2014-01-19 DIAGNOSIS — Z96653 Presence of artificial knee joint, bilateral: Secondary | ICD-10-CM | POA: Diagnosis present

## 2014-01-19 DIAGNOSIS — Z9841 Cataract extraction status, right eye: Secondary | ICD-10-CM

## 2014-01-19 DIAGNOSIS — I509 Heart failure, unspecified: Secondary | ICD-10-CM | POA: Diagnosis present

## 2014-01-19 DIAGNOSIS — R079 Chest pain, unspecified: Secondary | ICD-10-CM

## 2014-01-19 DIAGNOSIS — I10 Essential (primary) hypertension: Secondary | ICD-10-CM

## 2014-01-19 DIAGNOSIS — I160 Hypertensive urgency: Secondary | ICD-10-CM | POA: Diagnosis present

## 2014-01-19 DIAGNOSIS — R4701 Aphasia: Secondary | ICD-10-CM | POA: Diagnosis present

## 2014-01-19 DIAGNOSIS — Z7982 Long term (current) use of aspirin: Secondary | ICD-10-CM

## 2014-01-19 DIAGNOSIS — I69354 Hemiplegia and hemiparesis following cerebral infarction affecting left non-dominant side: Secondary | ICD-10-CM

## 2014-01-19 DIAGNOSIS — H5442 Blindness, left eye, normal vision right eye: Secondary | ICD-10-CM | POA: Diagnosis present

## 2014-01-19 DIAGNOSIS — R0789 Other chest pain: Secondary | ICD-10-CM | POA: Diagnosis not present

## 2014-01-19 DIAGNOSIS — I214 Non-ST elevation (NSTEMI) myocardial infarction: Principal | ICD-10-CM

## 2014-01-19 DIAGNOSIS — Z882 Allergy status to sulfonamides status: Secondary | ICD-10-CM

## 2014-01-19 DIAGNOSIS — Z91048 Other nonmedicinal substance allergy status: Secondary | ICD-10-CM

## 2014-01-19 DIAGNOSIS — M1990 Unspecified osteoarthritis, unspecified site: Secondary | ICD-10-CM | POA: Diagnosis present

## 2014-01-19 DIAGNOSIS — Z7952 Long term (current) use of systemic steroids: Secondary | ICD-10-CM

## 2014-01-19 DIAGNOSIS — I2510 Atherosclerotic heart disease of native coronary artery without angina pectoris: Secondary | ICD-10-CM | POA: Diagnosis present

## 2014-01-19 DIAGNOSIS — Z881 Allergy status to other antibiotic agents status: Secondary | ICD-10-CM

## 2014-01-19 DIAGNOSIS — Z9842 Cataract extraction status, left eye: Secondary | ICD-10-CM

## 2014-01-19 DIAGNOSIS — Z888 Allergy status to other drugs, medicaments and biological substances status: Secondary | ICD-10-CM

## 2014-01-19 DIAGNOSIS — Z947 Corneal transplant status: Secondary | ICD-10-CM

## 2014-01-19 HISTORY — DX: Cerebral infarction, unspecified: I63.9

## 2014-01-19 LAB — CBC
HCT: 44.8 % (ref 36.0–46.0)
Hemoglobin: 14.7 g/dL (ref 12.0–15.0)
MCH: 30.8 pg (ref 26.0–34.0)
MCHC: 32.8 g/dL (ref 30.0–36.0)
MCV: 93.9 fL (ref 78.0–100.0)
PLATELETS: 192 10*3/uL (ref 150–400)
RBC: 4.77 MIL/uL (ref 3.87–5.11)
RDW: 13.3 % (ref 11.5–15.5)
WBC: 5.4 10*3/uL (ref 4.0–10.5)

## 2014-01-19 LAB — BASIC METABOLIC PANEL
Anion gap: 11 (ref 5–15)
BUN: 18 mg/dL (ref 6–23)
CHLORIDE: 100 meq/L (ref 96–112)
CO2: 29 meq/L (ref 19–32)
CREATININE: 0.61 mg/dL (ref 0.50–1.10)
Calcium: 9.7 mg/dL (ref 8.4–10.5)
GFR calc Af Amer: >90 mL/min (ref >90)
GFR calc non Af Amer: 80 mL/min — ABNORMAL LOW (ref >90)
Glucose, Bld: 103 mg/dL — ABNORMAL HIGH (ref 70–99)
POTASSIUM: 4.3 meq/L (ref 3.7–5.3)
Sodium: 140 mEq/L (ref 137–147)

## 2014-01-19 LAB — I-STAT TROPONIN, ED: Troponin i, poc: 0.05 ng/mL (ref 0.00–0.08)

## 2014-01-19 LAB — TROPONIN I: Troponin I: <0.3 ng/mL (ref <0.30)

## 2014-01-19 MED ORDER — PREDNISOLONE ACETATE 1 % OP SUSP
1.0000 [drp] | Freq: Two times a day (BID) | OPHTHALMIC | Status: DC
  Administered 2014-01-19 – 2014-01-21 (×5): 1 [drp] via OPHTHALMIC
  Filled 2014-01-19: qty 1

## 2014-01-19 MED ORDER — NITROGLYCERIN 0.4 MG SL SUBL
0.4000 mg | SUBLINGUAL_TABLET | SUBLINGUAL | Status: AC | PRN
Start: 2014-01-19 — End: 2014-01-19
  Administered 2014-01-19 (×3): 0.4 mg via SUBLINGUAL

## 2014-01-19 MED ORDER — ASPIRIN 81 MG PO CHEW
324.0000 mg | CHEWABLE_TABLET | Freq: Once | ORAL | Status: DC
Start: 2014-01-19 — End: 2014-01-22
  Filled 2014-01-19 (×2): qty 4

## 2014-01-19 MED ORDER — NEBIVOLOL HCL 5 MG PO TABS
5.0000 mg | ORAL_TABLET | Freq: Two times a day (BID) | ORAL | Status: DC
  Administered 2014-01-20 – 2014-01-22 (×6): 5 mg via ORAL
  Filled 2014-01-19 (×8): qty 1

## 2014-01-19 MED ORDER — MORPHINE SULFATE 2 MG/ML IJ SOLN
2.0000 mg | Freq: Once | INTRAMUSCULAR | Status: AC
  Administered 2014-01-19: 2 mg via INTRAVENOUS
  Filled 2014-01-19: qty 1

## 2014-01-19 NOTE — ED Provider Notes (Signed)
TIME SEEN: 9:30 PM  CHIEF COMPLAINT: Chest pain  HPI: Patient is a 80 y.o. F with history of recent CVA, CHF, SVT who presents to the emergency department with left-sided chest pressure that radiates into her arm with associated lightheadedness. No shortness of breath, nausea or vomiting, diaphoresis. She states chest pain started at rest around 7 PM and has been constant. She has never had similar symptoms. Last stress test was many years ago. No history of heart attack. No history of cardiac catheterization. No fever or cough. No history of PE or DVT.   ROS: See HPI Constitutional: no fever  Eyes: no drainage  ENT: no runny nose   Cardiovascular:  chest pain  Resp: no SOB  GI: no vomiting GU: no dysuria Integumentary: no rash  Allergy: no hives  Musculoskeletal: no leg swelling  Neurological: no slurred speech ROS otherwise negative  PAST MEDICAL HISTORY/PAST SURGICAL HISTORY:  Past Medical History  Diagnosis Date  . Arthritis   . Blind left eye     Corneal transplant x3 with rejection  . Dysrhythmia   . Varicose veins     both legs and left fooot at arch  . Complication of anesthesia     sensitive to meds, bp can fall  . SVT (supraventricular tachycardia) 12/15/2011    seen by Dr. Caryl Comes  . Helicobacter pylori (H. pylori) 05/22/02    RUT-Positive  . Gastritis   . Diverticulosis     MEDICATIONS:  Prior to Admission medications   Medication Sig Start Date End Date Taking? Authorizing Provider  aspirin 325 MG tablet Take 1 tablet (325 mg total) by mouth daily. 01/06/14  Yes Hosie Poisson, MD  Melatonin 1 MG SUBL Place 1 mg under the tongue at bedtime.   Yes Historical Provider, MD  Multiple Vitamin (MULTI VITAMIN DAILY PO) Take 1 tablet by mouth every morning.   Yes Historical Provider, MD  nebivolol (BYSTOLIC) 5 MG tablet Take 1 tablet (5 mg total) by mouth 2 (two) times daily. 12/05/13  Yes [name], MD  Polyethyl Glycol-Propyl Glycol (SYSTANE OP) Place 1 drop into  both eyes as needed (for itching eyes).   Yes Historical Provider, MD  prednisoLONE acetate (PRED FORTE) 1 % ophthalmic suspension Place 1 drop into both eyes 2 (two) times daily.    Yes Historical Provider, MD    ALLERGIES:  Allergies  Allergen Reactions  . Ultram [Tramadol] Hives  . Buprenex [Buprenorphine Hcl] Other (See Comments)    Heart raced  . Augmentin [Amoxicillin-Pot Clavulanate] Nausea And Vomiting  . Levofloxacin Other (See Comments)    levaquin unknown reaction  . Percocet [Oxycodone-Acetaminophen] Itching and Other (See Comments)    "felt drained"  . Sulfa Antibiotics Other (See Comments)    unknown  . Tape Other (See Comments)    Adhesive tape causes skin to pull off  . Zetia [Ezetimibe] Other (See Comments)    unknown    SOCIAL HISTORY:  History  Substance Use Topics  . Smoking status: Never Smoker   . Smokeless tobacco: Never Used  . Alcohol Use: No    FAMILY HISTORY: Family History  Problem Relation Age of Onset  . Stroke Mother   . Heart attack Father   . Heart attack Brother   . Cancer Brother     EXAM: BP 210/84  Pulse 64  Temp(Src) 98.4 F (36.9 C) (Oral)  Resp 18  Ht 5\' 2"  (1.575 m)  Wt 165 lb (74.844 kg)  BMI 30.17 kg/m2  SpO2 97% CONSTITUTIONAL: Alert and oriented and responds appropriately to questions. Well-appearing; well-nourished HEAD: Normocephalic EYES: Conjunctivae clear, PERRL ENT: normal nose; no rhinorrhea; moist mucous membranes; pharynx without lesions noted NECK: Supple, no meningismus, no LAD  CARD: RRR; S1 and S2 appreciated; no murmurs, no clicks, no rubs, no gallops RESP: Normal chest excursion without splinting or tachypnea; breath sounds clear and equal bilaterally; no wheezes, no rhonchi, no rales,  ABD/GI: Normal bowel sounds; non-distended; soft, non-tender, no rebound, no guarding BACK:  The back appears normal and is non-tender to palpation, there is no CVA tenderness EXT: Normal ROM in all joints;  non-tender to palpation; no edema; normal capillary refill; no cyanosis    SKIN: Normal color for age and race; warm NEURO: Moves all extremities equally PSYCH: The patient's mood and manner are appropriate. Grooming and personal hygiene are appropriate.  MEDICAL DECISION MAKING: Pt here with chest pain with risk factors for the same. Her cardiac labs are unremarkable. EKG shows no ischemic changes. Chest x-ray clear. Her chest pain is completely gone after 3 nitroglycerin tablets. She took aspirin prior to arrival here. Discussed with hospitalist Dr. Alcario Drought for admission to hospital for chest pain rule out.       EKG Interpretation  Date/Time:  Friday January 19 2014 19:57:03 EDT Ventricular Rate:  70 PR Interval:  182 QRS Duration: 94 QT Interval:  416 QTC Calculation: 449 R Axis:   74 Text Interpretation:  Normal sinus rhythm Normal ECG Confirmed by Savon Cobbs,  DO, Levester Waldridge ([id number]) on 01/19/2014 9:38:33 PM         [place], DO 01/20/14 0010

## 2014-01-19 NOTE — ED Notes (Signed)
Pt. reports mid chest pain radiating to left arm onset this evening , denies SOB , no nausea or diaphoresis . Hypertensive at triage .

## 2014-01-20 ENCOUNTER — Encounter (HOSPITAL_COMMUNITY): Payer: Self-pay | Admitting: *Deleted

## 2014-01-20 DIAGNOSIS — R079 Chest pain, unspecified: Secondary | ICD-10-CM

## 2014-01-20 DIAGNOSIS — I160 Hypertensive urgency: Secondary | ICD-10-CM | POA: Diagnosis present

## 2014-01-20 DIAGNOSIS — R0789 Other chest pain: Secondary | ICD-10-CM

## 2014-01-20 DIAGNOSIS — I214 Non-ST elevation (NSTEMI) myocardial infarction: Secondary | ICD-10-CM

## 2014-01-20 DIAGNOSIS — I10 Essential (primary) hypertension: Secondary | ICD-10-CM

## 2014-01-20 LAB — TROPONIN I
TROPONIN I: 0.87 ng/mL — AB (ref <0.30)
Troponin I: 2.7 ng/mL (ref <0.30)
Troponin I: 1.27 ng/mL (ref <0.30)

## 2014-01-20 LAB — HEPARIN LEVEL (UNFRACTIONATED): HEPARIN UNFRACTIONATED: 0.53 [IU]/mL (ref 0.30–0.70)

## 2014-01-20 MED ORDER — ASPIRIN EC 81 MG PO TBEC
81.0000 mg | DELAYED_RELEASE_TABLET | Freq: Every day | ORAL | Status: DC
  Administered 2014-01-21: 81 mg via ORAL
  Filled 2014-01-20 (×3): qty 1

## 2014-01-20 MED ORDER — HEPARIN SODIUM (PORCINE) 5000 UNIT/ML IJ SOLN
5000.0000 [IU] | Freq: Three times a day (TID) | INTRAMUSCULAR | Status: DC
  Filled 2014-01-20 (×3): qty 1

## 2014-01-20 MED ORDER — POLYVINYL ALCOHOL 1.4 % OP SOLN
1.0000 [drp] | OPHTHALMIC | Status: DC | PRN
  Filled 2014-01-20: qty 15

## 2014-01-20 MED ORDER — ASPIRIN 325 MG PO TABS
325.0000 mg | ORAL_TABLET | Freq: Every day | ORAL | Status: DC
  Administered 2014-01-20: 325 mg via ORAL
  Filled 2014-01-20: qty 1

## 2014-01-20 MED ORDER — MELATONIN 1 MG SL SUBL: 1.0000 mg | SUBLINGUAL_TABLET | Freq: Every day | SUBLINGUAL | Status: DC

## 2014-01-20 MED ORDER — ONDANSETRON HCL 4 MG/2ML IJ SOLN
4.0000 mg | Freq: Four times a day (QID) | INTRAMUSCULAR | Status: DC | PRN
Start: 2014-01-20 — End: 2014-01-22

## 2014-01-20 MED ORDER — POLYETHYL GLYCOL-PROPYL GLYCOL 0.4-0.3 % OP SOLN: OPHTHALMIC | Status: DC | PRN

## 2014-01-20 MED ORDER — ATORVASTATIN CALCIUM 40 MG PO TABS
40.0000 mg | ORAL_TABLET | Freq: Every day | ORAL | Status: DC
  Administered 2014-01-20 – 2014-01-22 (×3): 40 mg via ORAL
  Filled 2014-01-20 (×3): qty 1

## 2014-01-20 MED ORDER — ACETAMINOPHEN 325 MG PO TABS
650.0000 mg | ORAL_TABLET | ORAL | Status: DC | PRN
  Administered 2014-01-22: 12:00:00 via ORAL

## 2014-01-20 MED ORDER — CLOPIDOGREL BISULFATE 75 MG PO TABS
75.0000 mg | ORAL_TABLET | Freq: Every day | ORAL | Status: DC
  Administered 2014-01-20 – 2014-01-22 (×3): 75 mg via ORAL
  Filled 2014-01-20 (×4): qty 1

## 2014-01-20 MED ORDER — HEPARIN (PORCINE) IN NACL 100-0.45 UNIT/ML-% IJ SOLN
900.0000 [IU]/h | INTRAMUSCULAR | Status: DC
  Administered 2014-01-20 – 2014-01-22 (×3): 900 [IU]/h via INTRAVENOUS
  Filled 2014-01-20 (×4): qty 250

## 2014-01-20 MED ORDER — HEPARIN BOLUS VIA INFUSION
3000.0000 [IU] | Freq: Once | INTRAVENOUS | Status: AC
  Administered 2014-01-20: 3000 [IU] via INTRAVENOUS
  Filled 2014-01-20: qty 3000

## 2014-01-20 MED ORDER — MORPHINE SULFATE 2 MG/ML IJ SOLN
2.0000 mg | INTRAMUSCULAR | Status: DC | PRN
  Administered 2014-01-21: 2 mg via INTRAVENOUS
  Filled 2014-01-20: qty 1

## 2014-01-20 MED ORDER — ISOSORBIDE MONONITRATE ER 30 MG PO TB24
30.0000 mg | ORAL_TABLET | Freq: Every day | ORAL | Status: DC
  Administered 2014-01-20 – 2014-01-22 (×3): 30 mg via ORAL
  Filled 2014-01-20 (×4): qty 1

## 2014-01-20 NOTE — Progress Notes (Signed)
MD made aware of elevated troponin level 2.70. New order noted.

## 2014-01-20 NOTE — H&P (Signed)
[facility] History and Physical  Tala Eber XVQ:[id number] DOB: 1925/03/15 DOA: 01/19/2014  Referring physician: EDP PCP: Marjorie Smolder, MD   Chief Complaint: Chest pain   HPI: [name] [name] is a 80 y.o. female who presents to the ED with c/o left sided chest pain.  Pain is pressure like in quality.  Radiates into left arm.  Associated with lightheadedness.  Symptoms onset at 7pm.  Symptoms were constant since onset but resolved completely with 3 of NTG and morphine.  Symptoms were associated with very high SBPs in the 200s on arrival to the EDs.  Review of Systems: Systems reviewed.  As above, otherwise negative  Past Medical History  Diagnosis Date  . Arthritis   . Blind left eye     Corneal transplant x3 with rejection  . Dysrhythmia   . Varicose veins     both legs and left fooot at arch  . Complication of anesthesia     sensitive to meds, bp can fall  . SVT (supraventricular tachycardia) 12/15/2011    seen by Dr. Caryl Comes  . Helicobacter pylori (H. pylori) 05/22/02    RUT-Positive  . Gastritis   . Diverticulosis    Past Surgical History  Procedure Laterality Date  . Cholecystectomy    . Tonsillectomy    . Tonsillectomy    . Corneal transplant  right 2009, left 2009    both eyes, 3 in left eye, 1 in right eye  . Cataract surgery  2005    both eyes  . Abdominal hysterectomy   sept 1985    ovaries removed  . Total knee arthroplasty Left 06/27/2012    Procedure: LEFT TOTAL KNEE ARTHROPLASTY;  Surgeon: Gearlean Alf, MD;  Location: [facility];  Service: Orthopedics;  Laterality: Left;  . Tubal ligation    . Total knee arthroplasty Right 12/12/2012    Procedure: RIGHT TOTAL KNEE ARTHROPLASTY;  Surgeon: Gearlean Alf, MD;  Location: [facility];  Service: Orthopedics;  Laterality: Right;   Social History:  reports that she has never smoked. She has never used smokeless tobacco. She reports that she does not drink alcohol or use illicit drugs.  Allergies  Allergen  Reactions  . Ultram [Tramadol] Hives  . Buprenex [Buprenorphine Hcl] Other (See Comments)    Heart raced  . Augmentin [Amoxicillin-Pot Clavulanate] Nausea And Vomiting  . Levofloxacin Other (See Comments)    levaquin unknown reaction  . Percocet [Oxycodone-Acetaminophen] Itching and Other (See Comments)    "felt drained"  . Sulfa Antibiotics Other (See Comments)    unknown  . Tape Other (See Comments)    Adhesive tape causes skin to pull off  . Zetia [Ezetimibe] Other (See Comments)    unknown    Family History  Problem Relation Age of Onset  . Stroke Mother   . Heart attack Father   . Heart attack Brother   . Cancer Brother      Prior to Admission medications   Medication Sig Start Date End Date Taking? Authorizing Provider  aspirin 325 MG tablet Take 1 tablet (325 mg total) by mouth daily. 01/06/14  Yes Hosie Poisson, MD  Melatonin 1 MG SUBL Place 1 mg under the tongue at bedtime.   Yes Historical Provider, MD  Multiple Vitamin (MULTI VITAMIN DAILY PO) Take 1 tablet by mouth every morning.   Yes Historical Provider, MD  nebivolol (BYSTOLIC) 5 MG tablet Take 1 tablet (5 mg total) by mouth 2 (two) times daily. 12/05/13  Yes [name], MD  Polyethyl Glycol-Propyl Glycol (SYSTANE OP) Place 1 drop into both eyes as needed (for itching eyes).   Yes Historical Provider, MD  prednisoLONE acetate (PRED FORTE) 1 % ophthalmic suspension Place 1 drop into both eyes 2 (two) times daily.    Yes Historical Provider, MD   Physical Exam: Filed Vitals:   01/20/14 0000  BP: 125/57  Pulse: 56  Temp:   Resp: 14    BP 125/57  Pulse 56  Temp(Src) 98.4 F (36.9 C) (Oral)  Resp 14  Ht 5\' 2"  (1.575 m)  Wt 74.844 kg (165 lb)  BMI 30.17 kg/m2  SpO2 98%  General Appearance:    Alert, oriented, no distress, appears stated age  Head:    Normocephalic, atraumatic  Eyes:    PERRL, EOMI, sclera non-icteric        Nose:   Nares without drainage or epistaxis. Mucosa, turbinates normal   Throat:   Moist mucous membranes. Oropharynx without erythema or exudate.  Neck:   Supple. No carotid bruits.  No thyromegaly.  No lymphadenopathy.   Back:     No CVA tenderness, no spinal tenderness  Lungs:     Clear to auscultation bilaterally, without wheezes, rhonchi or rales  Chest wall:    No tenderness to palpitation, no erythema, tenderness, edema, etc at the loop recorder site.  Heart:    Regular rate and rhythm without murmurs, gallops, rubs  Abdomen:     Soft, non-tender, nondistended, normal bowel sounds, no organomegaly  Genitalia:    deferred  Rectal:    deferred  Extremities:   No clubbing, cyanosis or edema.  Pulses:   2+ and symmetric all extremities  Skin:   Skin color, texture, turgor normal, no rashes or lesions  Lymph nodes:   Cervical, supraclavicular, and axillary nodes normal  Neurologic:   CNII-XII intact. Normal strength, sensation and reflexes      throughout    Labs on Admission:  Basic Metabolic Panel:  Recent Labs Lab 01/19/14 2028  NA 140  K 4.3  CL 100  CO2 29  GLUCOSE 103*  BUN 18  CREATININE 0.61  CALCIUM 9.7   Liver Function Tests: No results found for this basename: AST, ALT, ALKPHOS, BILITOT, PROT, ALBUMIN,  in the last 168 hours No results found for this basename: LIPASE, AMYLASE,  in the last 168 hours No results found for this basename: AMMONIA,  in the last 168 hours CBC:  Recent Labs Lab 01/19/14 2028  WBC 5.4  HGB 14.7  HCT 44.8  MCV 93.9  PLT 192   Cardiac Enzymes:  Recent Labs Lab 01/19/14 2020  TROPONINI <0.30    BNP (last 3 results) No results found for this basename: PROBNP,  in the last 8760 hours CBG: No results found for this basename: GLUCAP,  in the last 168 hours  Radiological Exams on Admission: Dg Chest 2 View  01/19/2014   CLINICAL DATA:  Left-sided chest pain.  EXAM: CHEST  2 VIEW  COMPARISON:  01/04/2014  FINDINGS: Heart size and pulmonary vascularity are normal. There is tortuosity of the  thoracic aorta. No infiltrates or effusions. Loop recorder in place. No acute osseous abnormality. Slight chronic accentuation of the thoracic kyphosis.  IMPRESSION: No acute abnormalities.   Electronically Signed   By: Rozetta Nunnery M.D.   On: 01/19/2014 21:14    EKG: Independently reviewed.  Assessment/Plan Principal Problem:   Chest pain at rest Active Problems:   Malignant hypertension   Hypertensive urgency, malignant  1. Chest pain at rest - CAD vs more likely hypertensive urgency.  Patients SBP in the 220s was the highest she has ever been known to have.  Labile blood pressures have been an ongoing issue for patient (See cardiology office notes).  When i saw her CP is resolved and BP down into the 130s.  Unclear if BP has come down due to resolution of CP, or BP came down due to NTG tabs and CP resolved as a result. 1. Morphine PRN pain 2. If re-develops pain then try NTG again 3. Holding off on ordering anything beyond home BP meds at this point for hypertensive urgency as patients BP is already down into the 130s. 4. Tele monitor 5. Serial trops 6. CP protocol 7. NPO 8. Call cards in AM and see if they want to do stress test.    Code Status: Full Code  Family Communication: Daughter at bedside Disposition Plan: Admit to obs   Time spent: 70 min  GARDNER, JARED M. [facility] Pager [phone]  If 7AM-7PM, please contact the day team taking care of the patient [email or web address] Password [email or web address] 01/20/2014, 12:15 AM

## 2014-01-20 NOTE — Consult Note (Signed)
Primary Cardiologist: Burt Knack Reason for Consultation: CP   HPI:  80 y/o woman with HTN and SVT followed by Dr. Burt Knack. Denies known h/o CAD. Had negative Myoview on 09/02/12 as part of SVT work-up.   Admitted two weeks ago for CVA with left-sided weakness and aphasia. ILR placed to look for AF. Symptoms resolved.  Has been following BPs at home and SBP 140-150 range. Over this past week has been feeling "weak" in chest and had several episodes of diaphoresis. Thought it was related to Zocor and she stopped this. Last night was standing at sink and had acute onset of CP down left arm. Came to ER and CP resolved promptly with NTG. SBP at time was 210/84. Trop has increased and now 2.7. ECG normal.   Feels ok now. Early this am had mild chest tightness on walking to bathroom but has since walked unit with no recurrent CP.    Review of Systems:     Cardiac Review of Systems: {Y] = yes [ ]  = no  Chest Pain [ y   ]  Resting SOB [   ] Exertional SOB  [  ]  Orthopnea [  ]   Pedal Edema [   ]    Palpitations [ y ] Syncope  [  ]   Presyncope [   ]  General Review of Systems: [Y] = yes [  ]=no Constitional: recent weight change [  ]; anorexia [  ]; fatigue [  ]; nausea [  ]; night sweats [  ]; fever [  ]; or chills [  ];                                                                      Eyes : blurred vision [  ]; diplopia [   ]; vision changes [  ];  Amaurosis fugax[  ]; Resp: cough [  ];  wheezing[  ];  hemoptysis[  ];  PND [  ];  GI:  gallstones[  ], vomiting[  ];  dysphagia[  ]; melena[  ];  hematochezia [  ]; heartburn[  ];   GU: kidney stones [  ]; hematuria[  ];   dysuria [  ];  nocturia[  ]; incontinence [  ];             Skin: rash, swelling[  ];, hair loss[  ];  peripheral edema[  ];  or itching[  ]; Musculosketetal: myalgias[  ];  joint swelling[  ];  joint erythema[  ];  joint pain[ y ];  back pain[  ];  Heme/Lymph: bruising[  ];  bleeding[  ];  anemia[  ];  Neuro: TIA[  ];   headaches[  ];  stroke[  y];  vertigo[  ];  seizures[  ];   paresthesias[  ];  difficulty walking[  ];  Psych:depression[  ]; anxiety[  ];  Endocrine: diabetes[  ];  thyroid dysfunction[  ];  Other:  Past Medical History  Diagnosis Date  . Arthritis   . Blind left eye     Corneal transplant x3 with rejection  . Dysrhythmia   . Varicose veins     both legs and left fooot at arch  . Complication of  anesthesia     sensitive to meds, bp can fall  . SVT (supraventricular tachycardia) 12/15/2011    seen by Dr. Caryl Comes  . Helicobacter pylori (H. pylori) 05/22/02    RUT-Positive  . Gastritis   . Diverticulosis   . Stroke     Medications Prior to Admission  Medication Sig Dispense Refill  . aspirin 325 MG tablet Take 1 tablet (325 mg total) by mouth daily.  60 tablet  1  . Melatonin 1 MG SUBL Place 1 mg under the tongue at bedtime.      . Multiple Vitamin (MULTI VITAMIN DAILY PO) Take 1 tablet by mouth every morning.      . nebivolol (BYSTOLIC) 5 MG tablet Take 1 tablet (5 mg total) by mouth 2 (two) times daily.  60 tablet  6  . Polyethyl Glycol-Propyl Glycol (SYSTANE OP) Place 1 drop into both eyes as needed (for itching eyes).      . prednisoLONE acetate (PRED FORTE) 1 % ophthalmic suspension Place 1 drop into both eyes 2 (two) times daily.          Marland Kitchen aspirin  324 mg Oral Once  . aspirin  325 mg Oral Daily  . heparin  3,000 Units Intravenous Once  . nebivolol  5 mg Oral BID  . prednisoLONE acetate  1 drop Both Eyes BID    Infusions: . heparin      Allergies  Allergen Reactions  . Ultram [Tramadol] Hives  . Buprenex [Buprenorphine Hcl] Other (See Comments)    Heart raced  . Augmentin [Amoxicillin-Pot Clavulanate] Nausea And Vomiting  . Levofloxacin Other (See Comments)    levaquin unknown reaction  . Percocet [Oxycodone-Acetaminophen] Itching and Other (See Comments)    "felt drained"  . Sulfa Antibiotics Other (See Comments)    unknown  . Tape Other (See Comments)     Adhesive tape causes skin to pull off  . Zetia [Ezetimibe] Other (See Comments)    unknown    History   Social History  . Marital Status: Widowed    Spouse Name: N/A    Number of Children: N/A  . Years of Education: N/A   Occupational History  . Not on file.   Social History Main Topics  . Smoking status: Never Smoker   . Smokeless tobacco: Never Used  . Alcohol Use: No  . Drug Use: No  . Sexual Activity: No   Other Topics Concern  . Not on file   Social History Narrative  . No narrative on file    Family History  Problem Relation Age of Onset  . Stroke Mother   . Heart attack Father   . Heart attack Brother   . Cancer Brother     PHYSICAL EXAM: Filed Vitals:   01/20/14 1415  BP: 130/55  Pulse: 61  Temp: 97.8 F (36.6 C)  Resp: 18     Intake/Output Summary (Last 24 hours) at 01/20/14 1420 Last data filed at 01/20/14 1300  Gross per 24 hour  Intake    480 ml  Output    600 ml  Net   -120 ml    General:  Well appearing. No respiratory difficulty HEENT: normal x for L eye opacification Neck: supple. JVP 7-8. Carotids 2+ bilat; no bruits. No lymphadenopathy or thryomegaly appreciated. Cor: PMI nondisplaced. Regular rate & rhythm. No rubs, gallops or murmurs. Lungs: clear Abdomen: soft, nontender, nondistended. No hepatosplenomegaly. No bruits or masses. Good bowel sounds. Extremities: no cyanosis, clubbing, rash, edema  Neuro: alert & oriented x 3, cranial nerves grossly intact. moves all 4 extremities w/o difficulty. Affect pleasant.  ECG: NSR No ST-T wave abnormalities.    Results for orders placed during the hospital encounter of 01/19/14 (from the past 24 hour(s))  TROPONIN I     Status: None   Collection Time    01/19/14  8:20 PM      Result Value Ref Range   Troponin I <0.30  <0.30 ng/mL  CBC     Status: None   Collection Time    01/19/14  8:28 PM      Result Value Ref Range   WBC 5.4  4.0 - 10.5 K/uL   RBC 4.77  3.87 - 5.11 MIL/uL    Hemoglobin 14.7  12.0 - 15.0 g/dL   HCT 44.8  36.0 - 46.0 %   MCV 93.9  78.0 - 100.0 fL   MCH 30.8  26.0 - 34.0 pg   MCHC 32.8  30.0 - 36.0 g/dL   RDW 13.3  11.5 - 15.5 %   Platelets 192  150 - 400 K/uL  BASIC METABOLIC PANEL     Status: Abnormal   Collection Time    01/19/14  8:28 PM      Result Value Ref Range   Sodium 140  137 - 147 mEq/L   Potassium 4.3  3.7 - 5.3 mEq/L   Chloride 100  96 - 112 mEq/L   CO2 29  19 - 32 mEq/L   Glucose, Bld 103 (*) 70 - 99 mg/dL   BUN 18  6 - 23 mg/dL   Creatinine, Ser 0.61  0.50 - 1.10 mg/dL   Calcium 9.7  8.4 - 10.5 mg/dL   GFR calc non Af Amer 80 (*) >90 mL/min   GFR calc Af Amer >90  >90 mL/min   Anion gap 11  5 - 15  I-STAT TROPOININ, ED     Status: None   Collection Time    01/19/14  9:43 PM      Result Value Ref Range   Troponin i, poc 0.05  0.00 - 0.08 ng/mL   Comment 3           TROPONIN I     Status: Abnormal   Collection Time    01/20/14  1:20 AM      Result Value Ref Range   Troponin I 0.87 (*) <0.30 ng/mL  TROPONIN I     Status: Abnormal   Collection Time    01/20/14  9:29 AM      Result Value Ref Range   Troponin I 2.70 (*) <0.30 ng/mL   Dg Chest 2 View  01/19/2014   CLINICAL DATA:  Left-sided chest pain.  EXAM: CHEST  2 VIEW  COMPARISON:  01/04/2014  FINDINGS: Heart size and pulmonary vascularity are normal. There is tortuosity of the thoracic aorta. No infiltrates or effusions. Loop recorder in place. No acute osseous abnormality. Slight chronic accentuation of the thoracic kyphosis.  IMPRESSION: No acute abnormalities.   Electronically Signed   By: Rozetta Nunnery M.D.   On: 01/19/2014 21:14     ASSESSMENT: 1. NSTEMI 2. Recent CVA 3. HTN 4. H/o SVT  PLAN/DISCUSSION:  Symptoms and troponin elevation are consistent with NSTEMI. I had an extensive talk with her and her daughter about risks and indications of cardiac cath vs stress testing particularly in the setting of a recent CVA. If symptoms persist would proceed  with cath. However, if she remains  asymptomatic could consider stress testing for further risk stratification and only proceed with cath if high-risk.  They want to think about this.   For now would start heparin (no bolus). Treat with ASA, Plavix, statin, b-blocker and Imdur.   They will make decision of cath vs stress testing in am. I quoted them ~1% risk of major complications with cath including repeat CVA.   Daniel Bensimhon,MD 2:28 PM

## 2014-01-20 NOTE — Progress Notes (Signed)
[place] for heparin Indication: chest pain/ACS  Allergies  Allergen Reactions  . Ultram [Tramadol] Hives  . Buprenex [Buprenorphine Hcl] Other (See Comments)    Heart raced  . Augmentin [Amoxicillin-Pot Clavulanate] Nausea And Vomiting  . Levofloxacin Other (See Comments)    levaquin unknown reaction  . Percocet [Oxycodone-Acetaminophen] Itching and Other (See Comments)    "felt drained"  . Sulfa Antibiotics Other (See Comments)    unknown  . Tape Other (See Comments)    Adhesive tape causes skin to pull off  . Zetia [Ezetimibe] Other (See Comments)    unknown    Patient Measurements: Height: 5\' 2"  (157.5 cm) Weight: 170 lb 1.6 oz (77.157 kg) IBW/kg (Calculated) : 50.1 Heparin Dosing Weight: 67kg  Vital Signs: Temp: 98.2 F (36.8 C) (10/31 1947) Temp Source: Oral (10/31 1947) BP: 122/60 mmHg (10/31 2124) Pulse Rate: 64 (10/31 1947)  Labs:  Recent Labs  01/19/14 2028 01/20/14 0120 01/20/14 0929 01/20/14 1300 01/20/14 2148  HGB 14.7  --   --   --   --   HCT 44.8  --   --   --   --   PLT 192  --   --   --   --   HEPARINUNFRC  --   --   --   --  0.53  CREATININE 0.61  --   --   --   --   TROPONINI  --  0.87* 2.70* 1.27*  --     Estimated Creatinine Clearance: 48.5 ml/min (by C-G formula based on Cr of 0.61).  Assessment: 80 y.o. female with chest pain for heparin  Goal of Therapy:  Heparin level 0.3-0.7 units/ml Monitor platelets by anticoagulation protocol: Yes   Plan:  Continue Heparin at current rate Follow-up am labs.  Phillis Knack, PharmD, BCPS

## 2014-01-20 NOTE — Progress Notes (Signed)
Patient admitted after midnight- please see H&P. Troponin mildly elevated, just here with CVA- cards consult  Eulogio Bear

## 2014-01-20 NOTE — Progress Notes (Signed)
UR completed 

## 2014-01-20 NOTE — Progress Notes (Signed)
ANTICOAGULATION CONSULT NOTE - Initial Consult  Pharmacy Consult for heparin Indication: chest pain/ACS  Allergies  Allergen Reactions  . Ultram [Tramadol] Hives  . Buprenex [Buprenorphine Hcl] Other (See Comments)    Heart raced  . Augmentin [Amoxicillin-Pot Clavulanate] Nausea And Vomiting  . Levofloxacin Other (See Comments)    levaquin unknown reaction  . Percocet [Oxycodone-Acetaminophen] Itching and Other (See Comments)    "felt drained"  . Sulfa Antibiotics Other (See Comments)    unknown  . Tape Other (See Comments)    Adhesive tape causes skin to pull off  . Zetia [Ezetimibe] Other (See Comments)    unknown    Patient Measurements: Height: 5\' 2"  (157.5 cm) Weight: 170 lb 1.6 oz (77.157 kg) IBW/kg (Calculated) : 50.1 Heparin Dosing Weight: 67kg  Vital Signs: Temp: 97.9 F (36.6 C) (10/31 0528) Temp Source: Oral (10/31 0528) BP: 146/63 mmHg (10/31 0952) Pulse Rate: 62 (10/31 0952)  Labs:  Recent Labs  01/19/14 2020 01/19/14 2028 01/20/14 0120 01/20/14 0929  HGB  --  14.7  --   --   HCT  --  44.8  --   --   PLT  --  192  --   --   CREATININE  --  0.61  --   --   TROPONINI <0.30  --  0.87* 2.70*    Estimated Creatinine Clearance: 48.5 ml/min (by C-G formula based on Cr of 0.61).   Medical History: Past Medical History  Diagnosis Date  . Arthritis   . Blind left eye     Corneal transplant x3 with rejection  . Dysrhythmia   . Varicose veins     both legs and left fooot at arch  . Complication of anesthesia     sensitive to meds, bp can fall  . SVT (supraventricular tachycardia) 12/15/2011    seen by Dr. Caryl Comes  . Helicobacter pylori (H. pylori) 05/22/02    RUT-Positive  . Gastritis   . Diverticulosis   . Stroke     Medications:  Prescriptions prior to admission  Medication Sig Dispense Refill  . aspirin 325 MG tablet Take 1 tablet (325 mg total) by mouth daily.  60 tablet  1  . Melatonin 1 MG SUBL Place 1 mg under the tongue at bedtime.       . Multiple Vitamin (MULTI VITAMIN DAILY PO) Take 1 tablet by mouth every morning.      . nebivolol (BYSTOLIC) 5 MG tablet Take 1 tablet (5 mg total) by mouth 2 (two) times daily.  60 tablet  6  . Polyethyl Glycol-Propyl Glycol (SYSTANE OP) Place 1 drop into both eyes as needed (for itching eyes).      . prednisoLONE acetate (PRED FORTE) 1 % ophthalmic suspension Place 1 drop into both eyes 2 (two) times daily.         Assessment: 80 year old woman admitted overnight with chest pain.  Troponin is now positive.  IV heparin to start. Patient had admission earlier this month with CVA.  Only anticoagulant PTA is aspirin. Goal of Therapy:  Heparin level 0.3-0.7 units/ml Monitor platelets by anticoagulation protocol: Yes   Plan:  Give 3000 units bolus x 1 Start heparin infusion at 900 units/hr Check anti-Xa level in 8 hours and daily while on heparin Continue to monitor H&H and platelets  Judie Bonus Gearldine Bienenstock 01/20/2014,1:22 PM

## 2014-01-20 NOTE — Progress Notes (Signed)
Lab call elevated troponin 0.87. Pt asymptomatic. Kenvil NP. Made her aware of troponin. No new orders given.

## 2014-01-21 DIAGNOSIS — I359 Nonrheumatic aortic valve disorder, unspecified: Secondary | ICD-10-CM

## 2014-01-21 DIAGNOSIS — Z91048 Other nonmedicinal substance allergy status: Secondary | ICD-10-CM | POA: Diagnosis not present

## 2014-01-21 DIAGNOSIS — I69354 Hemiplegia and hemiparesis following cerebral infarction affecting left non-dominant side: Secondary | ICD-10-CM | POA: Diagnosis not present

## 2014-01-21 DIAGNOSIS — I509 Heart failure, unspecified: Secondary | ICD-10-CM | POA: Diagnosis present

## 2014-01-21 DIAGNOSIS — R0789 Other chest pain: Secondary | ICD-10-CM | POA: Diagnosis present

## 2014-01-21 DIAGNOSIS — Z7982 Long term (current) use of aspirin: Secondary | ICD-10-CM | POA: Diagnosis not present

## 2014-01-21 DIAGNOSIS — I2510 Atherosclerotic heart disease of native coronary artery without angina pectoris: Secondary | ICD-10-CM | POA: Diagnosis present

## 2014-01-21 DIAGNOSIS — Z881 Allergy status to other antibiotic agents status: Secondary | ICD-10-CM | POA: Diagnosis not present

## 2014-01-21 DIAGNOSIS — Z9842 Cataract extraction status, left eye: Secondary | ICD-10-CM | POA: Diagnosis not present

## 2014-01-21 DIAGNOSIS — Z7952 Long term (current) use of systemic steroids: Secondary | ICD-10-CM | POA: Diagnosis not present

## 2014-01-21 DIAGNOSIS — M1990 Unspecified osteoarthritis, unspecified site: Secondary | ICD-10-CM | POA: Diagnosis present

## 2014-01-21 DIAGNOSIS — Z947 Corneal transplant status: Secondary | ICD-10-CM | POA: Diagnosis not present

## 2014-01-21 DIAGNOSIS — I10 Essential (primary) hypertension: Secondary | ICD-10-CM | POA: Diagnosis present

## 2014-01-21 DIAGNOSIS — R4701 Aphasia: Secondary | ICD-10-CM | POA: Diagnosis present

## 2014-01-21 DIAGNOSIS — Z9841 Cataract extraction status, right eye: Secondary | ICD-10-CM | POA: Diagnosis not present

## 2014-01-21 DIAGNOSIS — I214 Non-ST elevation (NSTEMI) myocardial infarction: Secondary | ICD-10-CM | POA: Diagnosis present

## 2014-01-21 DIAGNOSIS — Z96653 Presence of artificial knee joint, bilateral: Secondary | ICD-10-CM | POA: Diagnosis present

## 2014-01-21 DIAGNOSIS — Z888 Allergy status to other drugs, medicaments and biological substances status: Secondary | ICD-10-CM | POA: Diagnosis not present

## 2014-01-21 DIAGNOSIS — H5442 Blindness, left eye, normal vision right eye: Secondary | ICD-10-CM | POA: Diagnosis present

## 2014-01-21 DIAGNOSIS — I471 Supraventricular tachycardia: Secondary | ICD-10-CM | POA: Diagnosis present

## 2014-01-21 DIAGNOSIS — Z882 Allergy status to sulfonamides status: Secondary | ICD-10-CM | POA: Diagnosis not present

## 2014-01-21 DIAGNOSIS — Z90710 Acquired absence of both cervix and uterus: Secondary | ICD-10-CM | POA: Diagnosis not present

## 2014-01-21 LAB — HEPARIN LEVEL (UNFRACTIONATED): Heparin Unfractionated: 0.57 IU/mL (ref 0.30–0.70)

## 2014-01-21 LAB — BASIC METABOLIC PANEL
ANION GAP: 10 (ref 5–15)
BUN: 16 mg/dL (ref 6–23)
CALCIUM: 8.9 mg/dL (ref 8.4–10.5)
CHLORIDE: 106 meq/L (ref 96–112)
CO2: 28 mEq/L (ref 19–32)
Creatinine, Ser: 0.64 mg/dL (ref 0.50–1.10)
GFR calc Af Amer: >90 mL/min (ref >90)
GFR calc non Af Amer: 79 mL/min — ABNORMAL LOW (ref >90)
Glucose, Bld: 95 mg/dL (ref 70–99)
POTASSIUM: 4.1 meq/L (ref 3.7–5.3)
SODIUM: 144 meq/L (ref 137–147)

## 2014-01-21 LAB — CBC
HCT: 41.6 % (ref 36.0–46.0)
HEMOGLOBIN: 13.5 g/dL (ref 12.0–15.0)
MCH: 30.5 pg (ref 26.0–34.0)
MCHC: 32.5 g/dL (ref 30.0–36.0)
MCV: 93.9 fL (ref 78.0–100.0)
Platelets: 171 10*3/uL (ref 150–400)
RBC: 4.43 MIL/uL (ref 3.87–5.11)
RDW: 13.5 % (ref 11.5–15.5)
WBC: 5.7 10*3/uL (ref 4.0–10.5)

## 2014-01-21 NOTE — Progress Notes (Signed)
ANTICOAGULATION CONSULT NOTE  Pharmacy Consult for heparin Indication: chest pain/ACS  Patient Measurements: Height: 5\' 2"  (157.5 cm) Weight: 170 lb 1.6 oz (77.157 kg) IBW/kg (Calculated) : 50.1 Heparin Dosing Weight: 67kg  Vital Signs: Temp: 97.4 F (36.3 C) (11/01 0531) Temp Source: Oral (11/01 0531) BP: 134/58 mmHg (11/01 0531) Pulse Rate: 60 (11/01 0531)  Labs:  Recent Labs  01/19/14 2028 01/20/14 0120 01/20/14 0929 01/20/14 1300 01/20/14 2148 01/21/14 0431  HGB 14.7  --   --   --   --  13.5  HCT 44.8  --   --   --   --  41.6  PLT 192  --   --   --   --  171  HEPARINUNFRC  --   --   --   --  0.53 0.57  CREATININE 0.61  --   --   --   --  0.64  TROPONINI  --  0.87* 2.70* 1.27*  --   --     Estimated Creatinine Clearance: 48.5 mL/min (by C-G formula based on Cr of 0.64).  Assessment: 80 y.o. female with chest pain for heparin. Heparin level is therapeutic Goal of Therapy:  Heparin level 0.3-0.7 units/ml Monitor platelets by anticoagulation protocol: Yes   Plan:  Continue Heparin at current rate Daily heparin level and CBC.  Heide Guile, PharmD, BCPS Clinical Pharmacist Pager [phone]

## 2014-01-21 NOTE — Progress Notes (Signed)
  PROGRESS NOTE  [name] [name] ZOX:[id number] DOB: 1924-07-30 DOA: 01/19/2014 PCP: Marjorie Smolder, MD  Assessment/Plan: Chest pain at rest -  -Morphine PRN pain -troponins now coming down -Tele monitor -cards consult ST vs cath  HTN -improved  Recent CVA   Code Status: full Family Communication: patient/daughter Disposition Plan:    Consultants:  cards  Procedures:    HPI/Subjective: No CP -lots of questions about stress test/cath    Objective: Filed Vitals:   01/21/14 0531  BP: 134/58  Pulse: 60  Temp: 97.4 F (36.3 C)  Resp: 18    Intake/Output Summary (Last 24 hours) at 01/21/14 0945 Last data filed at 01/21/14 0900  Gross per 24 hour  Intake 611.25 ml  Output    200 ml  Net 411.25 ml   Filed Weights   01/19/14 2011 01/20/14 0032  Weight: 74.844 kg (165 lb) 77.157 kg (170 lb 1.6 oz)    Exam:   General:  A+Ox3, NAD  Cardiovascular: rrr  Respiratory: clear  Abdomen: +Bs, soft  Musculoskeletal: no edema  Data Reviewed: Basic Metabolic Panel:  Recent Labs Lab 01/19/14 2028 01/21/14 0431  NA 140 144  K 4.3 4.1  CL 100 106  CO2 29 28  GLUCOSE 103* 95  BUN 18 16  CREATININE 0.61 0.64  CALCIUM 9.7 8.9   Liver Function Tests: No results for input(s): AST, ALT, ALKPHOS, BILITOT, PROT, ALBUMIN in the last 168 hours. No results for input(s): LIPASE, AMYLASE in the last 168 hours. No results for input(s): AMMONIA in the last 168 hours. CBC:  Recent Labs Lab 01/19/14 2028 01/21/14 0431  WBC 5.4 5.7  HGB 14.7 13.5  HCT 44.8 41.6  MCV 93.9 93.9  PLT 192 171   Cardiac Enzymes:  Recent Labs Lab 01/19/14 2020 01/20/14 0120 01/20/14 0929 01/20/14 1300  TROPONINI <0.30 0.87* 2.70* 1.27*   BNP (last 3 results) No results for input(s): PROBNP in the last 8760 hours. CBG: No results for input(s): GLUCAP in the last 168 hours.  No results found for this or any previous visit (from the past 240 hour(s)).   Studies: Dg  Chest 2 View  01/19/2014   CLINICAL DATA:  Left-sided chest pain.  EXAM: CHEST  2 VIEW  COMPARISON:  01/04/2014  FINDINGS: Heart size and pulmonary vascularity are normal. There is tortuosity of the thoracic aorta. No infiltrates or effusions. Loop recorder in place. No acute osseous abnormality. Slight chronic accentuation of the thoracic kyphosis.  IMPRESSION: No acute abnormalities.   Electronically Signed   By: Rozetta Nunnery M.D.   On: 01/19/2014 21:14    Scheduled Meds: . aspirin  324 mg Oral Once  . aspirin EC  81 mg Oral Daily  . atorvastatin  40 mg Oral q1800  . clopidogrel  75 mg Oral Daily  . isosorbide mononitrate  30 mg Oral Daily  . nebivolol  5 mg Oral BID  . prednisoLONE acetate  1 drop Both Eyes BID   Continuous Infusions: . heparin 900 Units/hr (01/20/14 1425)   Antibiotics Given (last 72 hours)    None      Principal Problem:   Chest pain at rest Active Problems:   Malignant hypertension   Hypertensive urgency, malignant   NSTEMI (non-ST elevated myocardial infarction)    Time spent: 35 min    Marilynne Dupuis  [facility] Pager [phone]. If 7PM-7AM, please contact night-coverage at [email or web address], password [facility] 01/21/2014, 9:45 AM  LOS: 2 days

## 2014-01-21 NOTE — Progress Notes (Signed)
Patient ID: [name] [name], female   DOB: [birth year], 80 y.o.   MRN: [id number]   Patient Name: [name] [name] Date of Encounter: 01/21/2014     Principal Problem:   Chest pain at rest Active Problems:   Malignant hypertension   Hypertensive urgency, malignant   NSTEMI (non-ST elevated myocardial infarction)    SUBJECTIVE  No additional chest pain.   CURRENT MEDS . aspirin  324 mg Oral Once  . aspirin EC  81 mg Oral Daily  . atorvastatin  40 mg Oral q1800  . clopidogrel  75 mg Oral Daily  . isosorbide mononitrate  30 mg Oral Daily  . nebivolol  5 mg Oral BID  . prednisoLONE acetate  1 drop Both Eyes BID    OBJECTIVE  Filed Vitals:   01/20/14 1415 01/20/14 1947 01/20/14 2124 01/21/14 0531  BP: 130/55 114/40 122/60 134/58  Pulse: 61 64  60  Temp: 97.8 F (36.6 C) 98.2 F (36.8 C)  97.4 F (36.3 C)  TempSrc: Oral Oral  Oral  Resp: 18 18  18   Height:      Weight:      SpO2: 95% 95%  97%    Intake/Output Summary (Last 24 hours) at 01/21/14 1154 Last data filed at 01/21/14 0900  Gross per 24 hour  Intake 611.25 ml  Output    200 ml  Net 411.25 ml   Filed Weights   01/19/14 2011 01/20/14 0032  Weight: 165 lb (74.844 kg) 170 lb 1.6 oz (77.157 kg)    PHYSICAL EXAM  General: Pleasant, elderly woman, NAD. Neuro: Alert and oriented X 3. Moves all extremities spontaneously. Psych: Normal affect. HEENT:  Normal  Neck: Supple without bruits or JVD. Lungs:  Resp regular and unlabored, CTA. Heart: RRR no s3, s4, or murmurs. Abdomen: Soft, non-tender, non-distended, BS + x 4.  Extremities: No clubbing, cyanosis or edema. DP/PT/Radials 2+ and equal bilaterally.  Accessory Clinical Findings  CBC  Recent Labs  01/19/14 2028 01/21/14 0431  WBC 5.4 5.7  HGB 14.7 13.5  HCT 44.8 41.6  MCV 93.9 93.9  PLT 192 315   Basic Metabolic Panel  Recent Labs  01/19/14 2028 01/21/14 0431  NA 140 144  K 4.3 4.1  CL 100 106  CO2 29 28  GLUCOSE 103* 95  BUN 18 16   CREATININE 0.61 0.64  CALCIUM 9.7 8.9   Liver Function Tests No results for input(s): AST, ALT, ALKPHOS, BILITOT, PROT, ALBUMIN in the last 72 hours. No results for input(s): LIPASE, AMYLASE in the last 72 hours. Cardiac Enzymes  Recent Labs  01/20/14 0120 01/20/14 0929 01/20/14 1300  TROPONINI 0.87* 2.70* 1.27*   BNP Invalid input(s): POCBNP D-Dimer No results for input(s): DDIMER in the last 72 hours. Hemoglobin A1C No results for input(s): HGBA1C in the last 72 hours. Fasting Lipid Panel No results for input(s): CHOL, HDL, LDLCALC, TRIG, CHOLHDL, LDLDIRECT in the last 72 hours. Thyroid Function Tests No results for input(s): TSH, T4TOTAL, T3FREE, THYROIDAB in the last 72 hours.  Invalid input(s): FREET3  TELE  nsr  Radiology/Studies  Dg Chest 2 View  01/19/2014   CLINICAL DATA:  Left-sided chest pain.  EXAM: CHEST  2 VIEW  COMPARISON:  01/04/2014  FINDINGS: Heart size and pulmonary vascularity are normal. There is tortuosity of the thoracic aorta. No infiltrates or effusions. Loop recorder in place. No acute osseous abnormality. Slight chronic accentuation of the thoracic kyphosis.  IMPRESSION: No acute abnormalities.   Electronically Signed  By: Rozetta Nunnery M.D.   On: 01/19/2014 21:14   Dg Chest 2 View  01/04/2014   CLINICAL DATA:  Stroke.  Initial encounter.  EXAM: CHEST  2 VIEW  COMPARISON:  12/23/2012  FINDINGS: There is mild cardiomegaly which is chronic. Mediastinal contours are stable from previous, with apparent upper mediastinal widening related to apical lordotic positioning. There is no edema, consolidation, effusion, or pneumothorax.  IMPRESSION: No active cardiopulmonary disease.   Electronically Signed   By: Jorje Guild M.D.   On: 01/04/2014 23:27   Ct Head Wo Contrast  01/04/2014   CLINICAL DATA:  Right-sided facial droop, slurred speech.  EXAM: CT HEAD WITHOUT CONTRAST  TECHNIQUE: Contiguous axial images were obtained from the base of the skull  through the vertex without intravenous contrast.  COMPARISON:  CT scan of October 12, 2007.  FINDINGS: Bony calvarium appears intact. Mild diffuse cortical atrophy is noted. Mild chronic ischemic white matter disease is noted. No mass effect or midline shift is noted. Ventricular size is within normal limits. There is no evidence of mass lesion, hemorrhage or acute infarction.  IMPRESSION: Mild diffuse cortical atrophy. Mild chronic ischemic white matter disease. No acute intracranial abnormality seen. Critical Value/emergent results were called by telephone at the time of interpretation on 01/04/2014 at 2:38 pm to Dr. Noemi Chapel, who verbally acknowledged these results.   Electronically Signed   By: Sabino Dick M.D.   On: 01/04/2014 14:39   Mr Brain Wo Contrast  01/04/2014   CLINICAL DATA:  Initial evaluation for a sudden onset right arm weakness. Evaluate for stroke.  EXAM: MRI HEAD WITHOUT CONTRAST  MRA HEAD WITHOUT CONTRAST  TECHNIQUE: Multiplanar, multiecho pulse sequences of the brain and surrounding structures were obtained without intravenous contrast. Angiographic images of the head were obtained using MRA technique without contrast.  COMPARISON:  Prior CT performed earlier on the same day.  FINDINGS: MRI HEAD FINDINGS  There is a wedge-shaped area of restricted diffusion involving the cortical gray matter of the high left frontal lobe, compatible with acute ischemic infarct (series 4, image 27). This area of infarction measures 9.1 x 15.3 mm, and appears to involve the motor cortex. No associated hemorrhage or significant mass effect. No other intracranial hemorrhage. Gray-white matter differentiation is otherwise maintained.  No mass lesion or midline shift. Patchy T2/FLAIR hyperintensity within the periventricular and deep white matter both cerebral hemispheres is most compatible with chronic small vessel ischemic changes, moderate for patient age. Diffuse prominence of the CSF containing spaces is  compatible with generalized atrophy. Ventricles are within normal limits without evidence of hydrocephalus. No extra-axial fluid collection.  Craniocervical junction within normal limits. Pituitary gland unremarkable.  Chronic changes seen at the left globe. No acute abnormality seen about either orbit.  Signal intensity within the visualized bone marrow is normal. Mild degenerative changes noted within the upper cervical spine.  Paranasal sinuses and mastoid air cells are clear.  MRA HEAD FINDINGS  ANTERIOR CIRCULATION:  Visualized portions of the distal cervical segments of the internal carotid arteries are widely patent with antegrade flow. The petrous, cavernous, and supra clinoid segments are widely patent without hemodynamically significant stenosis.  A1 segments, anterior communicating artery, and anterior cerebral arteries are widely patent without high-grade stenosis or acute abnormality.  M1 segments are widely patent bilaterally without proximal branch occlusion or significant stenosis. MCA bifurcations within normal limits. Distal MCA branches patent bilaterally.  POSTERIOR CIRCULATION:  Left vertebral artery is dominant. Posterior inferior cerebral arteries patent  bilaterally. Vertebrobasilar junction and basilar artery within normal limits without high-grade stenosis or proximal branch occlusion. Superior cerebral arteries patent bilaterally. The left superior cerebral artery appears to arise from the left P1 segment. There is a single short segment moderate stenosis within the proximal right posterior cerebral artery.  No aneurysm or vascular malformation identified within the intracranial circulation.  IMPRESSION: MRI HEAD IMPRESSION:  1. Acute ischemic infarct involving the cortical gray matter of the high left frontal lobe as above. No associated mass effect or hemorrhage. 2. Atrophy with moderate chronic microvascular ischemic disease.  MRA HEAD IMPRESSION:  1. No proximal branch occlusion  identified within the intracranial circulation. 2. Single moderate short-segment stenosis within the proximal right posterior cerebral artery. No other hemodynamically significant stenosis identified within the intracranial circulation.   Electronically Signed   By: Jeannine Boga M.D.   On: 01/04/2014 23:36   Ct Abdomen Pelvis W Contrast  01/02/2014   CLINICAL DATA:  Recent diverticulitis. Left lower quadrant abdominal pain. Patient on second round of antibiotics.  EXAM: CT ABDOMEN AND PELVIS WITH CONTRAST  TECHNIQUE: Multidetector CT imaging of the abdomen and pelvis was performed using the standard protocol following bolus administration of intravenous contrast.  CONTRAST:  135mL OMNIPAQUE IOHEXOL 300 MG/ML  SOLN  COMPARISON:  01/30/2011  FINDINGS: Lower chest: The lung bases are clear. The heart is normal in size. No pericardial effusion. Coronary artery calcifications are noted.  Hepatobiliary: Mild central intrahepatic and mild common bile duct dilatation likely due to prior cholecystectomy. No focal hepatic lesions.  Pancreas: Normal.  Spleen: Normal.  Adrenals/Urinary Tract: Normal.  Stomach/Bowel: The stomach, duodenum, small bowel and colon are grossly normal. No inflammatory changes, mass lesions or obstructive findings. Moderate diverticulosis of the sigmoid colon. There is a tiny wisp of fluid near the upper sigmoid colon which may be the sequela of prior diverticulitis. The appendix is normal.  Vascular/Lymphatic: Moderate atherosclerotic calcifications involving the aorta but no focal aneurysm or dissection. The branch vessels are patent. No mesenteric or retroperitoneal mass or lymphadenopathy. Small scattered lymph nodes are noted.  Pelvis: The uterus is surgically absent. The right ovary is still present. The left ovary is not identified for certain. The bladder is normal except for small cystocele. No inguinal mass or adenopathy.  Musculoskeletal: No significant bony findings.   IMPRESSION: No findings for acute diverticulitis. Small wisp of fluid near the upper sigmoid colon is likely the sequela of resolved diverticulitis.  Moderate stool in the ascending and transverse colon.  Small cystocele.   Electronically Signed   By: Kalman Jewels M.D.   On: 01/02/2014 16:02   Dg Foot Complete Left  01/11/2014   X-ray examination left foot  Healing oblique fracture fourth metatarsal with minimal proximal lateral  migration of the distal fragment Inferior calcaneal spur  Radiographic impression: Consolidated fracture fourth metatarsal in satisfactory alignment  Mr Jodene Nam Head/brain Wo Cm  01/04/2014   CLINICAL DATA:  Initial evaluation for a sudden onset right arm weakness. Evaluate for stroke.  EXAM: MRI HEAD WITHOUT CONTRAST  MRA HEAD WITHOUT CONTRAST  TECHNIQUE: Multiplanar, multiecho pulse sequences of the brain and surrounding structures were obtained without intravenous contrast. Angiographic images of the head were obtained using MRA technique without contrast.  COMPARISON:  Prior CT performed earlier on the same day.  FINDINGS: MRI HEAD FINDINGS  There is a wedge-shaped area of restricted diffusion involving the cortical gray matter of the high left frontal lobe, compatible with acute ischemic infarct (series 4,  image 27). This area of infarction measures 9.1 x 15.3 mm, and appears to involve the motor cortex. No associated hemorrhage or significant mass effect. No other intracranial hemorrhage. Gray-white matter differentiation is otherwise maintained.  No mass lesion or midline shift. Patchy T2/FLAIR hyperintensity within the periventricular and deep white matter both cerebral hemispheres is most compatible with chronic small vessel ischemic changes, moderate for patient age. Diffuse prominence of the CSF containing spaces is compatible with generalized atrophy. Ventricles are within normal limits without evidence of hydrocephalus. No extra-axial fluid collection.  Craniocervical  junction within normal limits. Pituitary gland unremarkable.  Chronic changes seen at the left globe. No acute abnormality seen about either orbit.  Signal intensity within the visualized bone marrow is normal. Mild degenerative changes noted within the upper cervical spine.  Paranasal sinuses and mastoid air cells are clear.  MRA HEAD FINDINGS  ANTERIOR CIRCULATION:  Visualized portions of the distal cervical segments of the internal carotid arteries are widely patent with antegrade flow. The petrous, cavernous, and supra clinoid segments are widely patent without hemodynamically significant stenosis.  A1 segments, anterior communicating artery, and anterior cerebral arteries are widely patent without high-grade stenosis or acute abnormality.  M1 segments are widely patent bilaterally without proximal branch occlusion or significant stenosis. MCA bifurcations within normal limits. Distal MCA branches patent bilaterally.  POSTERIOR CIRCULATION:  Left vertebral artery is dominant. Posterior inferior cerebral arteries patent bilaterally. Vertebrobasilar junction and basilar artery within normal limits without high-grade stenosis or proximal branch occlusion. Superior cerebral arteries patent bilaterally. The left superior cerebral artery appears to arise from the left P1 segment. There is a single short segment moderate stenosis within the proximal right posterior cerebral artery.  No aneurysm or vascular malformation identified within the intracranial circulation.  IMPRESSION: MRI HEAD IMPRESSION:  1. Acute ischemic infarct involving the cortical gray matter of the high left frontal lobe as above. No associated mass effect or hemorrhage. 2. Atrophy with moderate chronic microvascular ischemic disease.  MRA HEAD IMPRESSION:  1. No proximal branch occlusion identified within the intracranial circulation. 2. Single moderate short-segment stenosis within the proximal right posterior cerebral artery. No other  hemodynamically significant stenosis identified within the intracranial circulation.   Electronically Signed   By: Jeannine Boga M.D.   On: 01/04/2014 23:36    ASSESSMENT AND PLAN  1. Chest pain  2. Stroke 3. NSTEMI Rec: I have discussed the treatment options with the patient and her daughter. She is very afraid of another stroke. She wishes to proceed with stress testing. Will schedule tomorrow. If abnormal then catheterization indicated, despite risks. I spent over 30 min. With the patient and her family coming up with a plan.   Emilie Carp,M.D.  01/21/2014 11:54 AM

## 2014-01-21 NOTE — Progress Notes (Signed)
Utilization review completed.  

## 2014-01-21 NOTE — Plan of Care (Signed)
Problem: Phase II Progression Outcomes Goal: Hemodynamically stable Outcome: Progressing Goal: Anginal pain relieved Outcome: Progressing Taking Imdur; report heaviness in chest since CVA however denies chest pain/nausea Goal: Stress Test if indicated Outcome: Progressing Scheduled for myocar planar w/wall tomorrow morning; pt stated understanding of procedure and prep Goal: Cath/PCI Day Path if indicated Outcome: Not Applicable Date Met:  31/51/76 N/a at present; pt aware that if stress test is positive will proceed to cath

## 2014-01-21 NOTE — Progress Notes (Signed)
  Echocardiogram 2D Echocardiogram has been performed.  [name] [name] M 01/21/2014, 2:59 PM

## 2014-01-22 ENCOUNTER — Inpatient Hospital Stay (HOSPITAL_COMMUNITY): Payer: Medicare Other

## 2014-01-22 DIAGNOSIS — R079 Chest pain, unspecified: Secondary | ICD-10-CM

## 2014-01-22 LAB — CBC
HEMATOCRIT: 39.9 % (ref 36.0–46.0)
HEMOGLOBIN: 13.2 g/dL (ref 12.0–15.0)
MCH: 30.9 pg (ref 26.0–34.0)
MCHC: 33.1 g/dL (ref 30.0–36.0)
MCV: 93.4 fL (ref 78.0–100.0)
PLATELETS: 156 10*3/uL (ref 150–400)
RBC: 4.27 MIL/uL (ref 3.87–5.11)
RDW: 13.4 % (ref 11.5–15.5)
WBC: 4.9 10*3/uL (ref 4.0–10.5)

## 2014-01-22 LAB — HEPARIN LEVEL (UNFRACTIONATED): Heparin Unfractionated: 0.52 IU/mL (ref 0.30–0.70)

## 2014-01-22 MED ORDER — ASPIRIN 81 MG PO TBEC: 81.0000 mg | DELAYED_RELEASE_TABLET | Freq: Every day | ORAL | Status: DC

## 2014-01-22 MED ORDER — REGADENOSON 0.4 MG/5ML IV SOLN
INTRAVENOUS | Status: AC
  Filled 2014-01-22: qty 5

## 2014-01-22 MED ORDER — ACETAMINOPHEN 325 MG PO TABS
ORAL_TABLET | ORAL | Status: AC
  Filled 2014-01-22: qty 2

## 2014-01-22 MED ORDER — REGADENOSON 0.4 MG/5ML IV SOLN
0.4000 mg | Freq: Once | INTRAVENOUS | Status: AC
  Administered 2014-01-22: 12:00:00 via INTRAVENOUS
  Filled 2014-01-22: qty 5

## 2014-01-22 MED ORDER — ISOSORBIDE MONONITRATE ER 30 MG PO TB24: 30.0000 mg | ORAL_TABLET | Freq: Every day | ORAL | Status: DC

## 2014-01-22 MED ORDER — TECHNETIUM TC 99M SESTAMIBI GENERIC - CARDIOLITE
10.0000 | Freq: Once | INTRAVENOUS | Status: AC | PRN
  Administered 2014-01-22: 10 via INTRAVENOUS

## 2014-01-22 MED ORDER — CLOPIDOGREL BISULFATE 75 MG PO TABS: 75.0000 mg | ORAL_TABLET | Freq: Every day | ORAL | Status: DC

## 2014-01-22 MED ORDER — TECHNETIUM TC 99M SESTAMIBI GENERIC - CARDIOLITE
30.0000 | Freq: Once | INTRAVENOUS | Status: AC | PRN
  Administered 2014-01-22: 30 via INTRAVENOUS

## 2014-01-22 NOTE — Progress Notes (Signed)
  PROGRESS NOTE  [name] [name] YYT:[id number] DOB: 1924-09-21 DOA: 01/19/2014 PCP: Marjorie Smolder, MD  Assessment/Plan: Chest pain at rest -  -Morphine PRN pain -troponins now coming down -Tele monitor -cards consult ST today- may need cath  HTN -improved  Recent CVA   Code Status: full Family Communication: patient/daughter Disposition Plan: await stress test   Consultants:  cards  Procedures:    HPI/Subjective: Mild CP last night No fever, no chills   Objective: Filed Vitals:   01/22/14 0620  BP: 156/54  Pulse: 70  Temp: 98 F (36.7 C)  Resp: 18    Intake/Output Summary (Last 24 hours) at 01/22/14 1008 Last data filed at 01/22/14 0730  Gross per 24 hour  Intake  823.2 ml  Output    301 ml  Net  522.2 ml   Filed Weights   01/19/14 2011 01/20/14 0032  Weight: 74.844 kg (165 lb) 77.157 kg (170 lb 1.6 oz)    Exam:   General:  A+Ox3, NAD  Cardiovascular: rrr  Respiratory: clear  Abdomen: +Bs, soft  Musculoskeletal: no edema  Data Reviewed: Basic Metabolic Panel:  Recent Labs Lab 01/19/14 2028 01/21/14 0431  NA 140 144  K 4.3 4.1  CL 100 106  CO2 29 28  GLUCOSE 103* 95  BUN 18 16  CREATININE 0.61 0.64  CALCIUM 9.7 8.9   Liver Function Tests: No results for input(s): AST, ALT, ALKPHOS, BILITOT, PROT, ALBUMIN in the last 168 hours. No results for input(s): LIPASE, AMYLASE in the last 168 hours. No results for input(s): AMMONIA in the last 168 hours. CBC:  Recent Labs Lab 01/19/14 2028 01/21/14 0431 01/22/14 0408  WBC 5.4 5.7 4.9  HGB 14.7 13.5 13.2  HCT 44.8 41.6 39.9  MCV 93.9 93.9 93.4  PLT 192 171 156   Cardiac Enzymes:  Recent Labs Lab 01/19/14 2020 01/20/14 0120 01/20/14 0929 01/20/14 1300  TROPONINI <0.30 0.87* 2.70* 1.27*   BNP (last 3 results) No results for input(s): PROBNP in the last 8760 hours. CBG: No results for input(s): GLUCAP in the last 168 hours.  No results found for this or any  previous visit (from the past 240 hour(s)).   Studies: No results found.  Scheduled Meds: . aspirin  324 mg Oral Once  . aspirin EC  81 mg Oral Daily  . atorvastatin  40 mg Oral q1800  . clopidogrel  75 mg Oral Daily  . isosorbide mononitrate  30 mg Oral Daily  . nebivolol  5 mg Oral BID  . prednisoLONE acetate  1 drop Both Eyes BID   Continuous Infusions: . heparin 900 Units/hr (01/21/14 1831)   Antibiotics Given (last 72 hours)    None      Principal Problem:   Chest pain at rest Active Problems:   Malignant hypertension   Hypertensive urgency, malignant   NSTEMI (non-ST elevated myocardial infarction)    Time spent: 25 min    [facility], JESSICA  [facility] Pager [phone]. If 7PM-7AM, please contact night-coverage at [email or web address], password [facility] 01/22/2014, 10:08 AM  LOS: 3 days

## 2014-01-22 NOTE — Progress Notes (Signed)
ANTICOAGULATION CONSULT NOTE   Pharmacy Consult for heparin Indication: chest pain/ACS  Patient Measurements: Height: 5\' 2"  (157.5 cm) Weight: 170 lb 1.6 oz (77.157 kg) IBW/kg (Calculated) : 50.1 Heparin Dosing Weight: 67kg  Vital Signs: Temp: 98 F (36.7 C) (11/02 0620) Temp Source: Oral (11/02 0620) BP: 156/54 mmHg (11/02 0620) Pulse Rate: 70 (11/02 0620)  Labs:  Recent Labs  01/19/14 2028 01/20/14 0120 01/20/14 0929 01/20/14 1300 01/20/14 2148 01/21/14 0431 01/22/14 0408  HGB 14.7  --   --   --   --  13.5 13.2  HCT 44.8  --   --   --   --  41.6 39.9  PLT 192  --   --   --   --  171 156  HEPARINUNFRC  --   --   --   --  0.53 0.57 0.52  CREATININE 0.61  --   --   --   --  0.64  --   TROPONINI  --  0.87* 2.70* 1.27*  --   --   --     Estimated Creatinine Clearance: 48.5 mL/min (by C-G formula based on Cr of 0.64).  Assessment: 80 y/o female admitted with CP on 10/31. Of note she had a stroke in mid October. She continues on a heparin drip. Heparin level is therapeutic at 0.52. Had a blood blister in mouth that popped but no overt bleeding, CBC is stable. Plan is for lexi scan today.  Goal of Therapy:  Heparin level 0.3-0.7 units/ml (try to keep 0.3-0.5 with recent stroke) Monitor platelets by anticoagulation protocol: Yes   Plan:  Continue heparin drip at 900 units/hr Daily heparin level and CBC F/u results of lexi scan  [facility], [place].D., BCPS Clinical Pharmacist Pager: [phone] 01/22/2014 11:09 AM

## 2014-01-22 NOTE — Discharge Summary (Signed)
Physician Discharge Summary  [name] [name] [id number] DOB: 1924/08/09 DOA: 01/19/2014  PCP: Marjorie Smolder, MD  Admit date: 01/19/2014 Discharge date: 01/22/2014  Time spent: 35 minutes  Recommendations for Outpatient Follow-up:  1. Patient declined cath- opted for stress test that was read as low risk  Discharge Diagnoses:  Principal Problem:   Chest pain at rest Active Problems:   Malignant hypertension   Hypertensive urgency, malignant   NSTEMI (non-ST elevated myocardial infarction)   Discharge Condition: improved  Diet recommendation: cardiac  Filed Weights   01/19/14 2011 01/20/14 0032  Weight: 74.844 kg (165 lb) 77.157 kg (170 lb 1.6 oz)    History of present illness:  [name] [name] is a 80 y.o. female who presents to the ED with c/o left sided chest pain. Pain is pressure like in quality. Radiates into left arm. Associated with lightheadedness. Symptoms onset at 7pm. Symptoms were constant since onset but resolved completely with 3 of NTG and morphine. Symptoms were associated with very high SBPs in the 200s on arrival to the EDs  Hospital Course:  Stress test was low risk- patient discharged with medical management  Procedures:  Stress test as patient declined catherization  Consultations:  cardiology  Discharge Exam: Filed Vitals:   01/22/14 1345  BP: 153/56  Pulse: 68  Temp: 97.4 F (36.3 C)  Resp: 18      Discharge Instructions You were cared for by a hospitalist during your hospital stay. If you have any questions about your discharge medications or the care you received while you were in the hospital after you are discharged, you can call the unit and asked to speak with the hospitalist on call if the hospitalist that took care of you is not available. Once you are discharged, your primary care physician will handle any further medical issues. Please note that NO REFILLS for any discharge medications will be authorized once you are  discharged, as it is imperative that you return to your primary care physician (or establish a relationship with a primary care physician if you do not have one) for your aftercare needs so that they can reassess your need for medications and monitor your lab values.  Discharge Instructions    Diet - low sodium heart healthy    Complete by:  As directed      Discharge instructions    Complete by:  As directed   Continue zocor at home     Increase activity slowly    Complete by:  As directed           Current Discharge Medication List    START taking these medications   Details  aspirin EC 81 MG EC tablet Take 1 tablet (81 mg total) by mouth daily.    clopidogrel (PLAVIX) 75 MG tablet Take 1 tablet (75 mg total) by mouth daily. Qty: 30 tablet, Refills: 0    isosorbide mononitrate (IMDUR) 30 MG 24 hr tablet Take 1 tablet (30 mg total) by mouth daily. Qty: 30 tablet, Refills: 0      CONTINUE these medications which have NOT CHANGED   Details  Melatonin 1 MG SUBL Place 1 mg under the tongue at bedtime.    Multiple Vitamin (MULTI VITAMIN DAILY PO) Take 1 tablet by mouth every morning.    nebivolol (BYSTOLIC) 5 MG tablet Take 1 tablet (5 mg total) by mouth 2 (two) times daily. Qty: 60 tablet, Refills: 6   Associated Diagnoses: Dizziness; Essential hypertension, malignant    Polyethyl Glycol-Propyl Glycol (  SYSTANE OP) Place 1 drop into both eyes as needed (for itching eyes).    prednisoLONE acetate (PRED FORTE) 1 % ophthalmic suspension Place 1 drop into both eyes 2 (two) times daily.       STOP taking these medications     aspirin 325 MG tablet        Allergies  Allergen Reactions  . Ultram [Tramadol] Hives  . Buprenex [Buprenorphine Hcl] Other (See Comments)    Heart raced  . Other Swelling    Yellow eye dye that is used to check retina. Caused swelling of tongue.  . Augmentin [Amoxicillin-Pot Clavulanate] Nausea And Vomiting  . Levofloxacin Other (See Comments)     levaquin unknown reaction  . Percocet [Oxycodone-Acetaminophen] Nausea And Vomiting and Other (See Comments)    "felt drained"  . Sulfa Antibiotics Other (See Comments)    unknown  . Tape Other (See Comments)    Adhesive tape causes skin to pull off  . Zetia [Ezetimibe] Other (See Comments)    unknown      The results of significant diagnostics from this hospitalization (including imaging, microbiology, ancillary and laboratory) are listed below for reference.    Significant Diagnostic Studies: Dg Chest 2 View  01/19/2014   CLINICAL DATA:  Left-sided chest pain.  EXAM: CHEST  2 VIEW  COMPARISON:  01/04/2014  FINDINGS: Heart size and pulmonary vascularity are normal. There is tortuosity of the thoracic aorta. No infiltrates or effusions. Loop recorder in place. No acute osseous abnormality. Slight chronic accentuation of the thoracic kyphosis.  IMPRESSION: No acute abnormalities.   Electronically Signed   By: Rozetta Nunnery M.D.   On: 01/19/2014 21:14   Dg Chest 2 View  01/04/2014   CLINICAL DATA:  Stroke.  Initial encounter.  EXAM: CHEST  2 VIEW  COMPARISON:  12/23/2012  FINDINGS: There is mild cardiomegaly which is chronic. Mediastinal contours are stable from previous, with apparent upper mediastinal widening related to apical lordotic positioning. There is no edema, consolidation, effusion, or pneumothorax.  IMPRESSION: No active cardiopulmonary disease.   Electronically Signed   By: Jorje Guild M.D.   On: 01/04/2014 23:27   Ct Head Wo Contrast  01/04/2014   CLINICAL DATA:  Right-sided facial droop, slurred speech.  EXAM: CT HEAD WITHOUT CONTRAST  TECHNIQUE: Contiguous axial images were obtained from the base of the skull through the vertex without intravenous contrast.  COMPARISON:  CT scan of October 12, 2007.  FINDINGS: Bony calvarium appears intact. Mild diffuse cortical atrophy is noted. Mild chronic ischemic white matter disease is noted. No mass effect or midline shift is noted.  Ventricular size is within normal limits. There is no evidence of mass lesion, hemorrhage or acute infarction.  IMPRESSION: Mild diffuse cortical atrophy. Mild chronic ischemic white matter disease. No acute intracranial abnormality seen. Critical Value/emergent results were called by telephone at the time of interpretation on 01/04/2014 at 2:38 pm to Dr. Noemi Chapel, who verbally acknowledged these results.   Electronically Signed   By: Sabino Dick M.D.   On: 01/04/2014 14:39   Mr Brain Wo Contrast  01/04/2014   CLINICAL DATA:  Initial evaluation for a sudden onset right arm weakness. Evaluate for stroke.  EXAM: MRI HEAD WITHOUT CONTRAST  MRA HEAD WITHOUT CONTRAST  TECHNIQUE: Multiplanar, multiecho pulse sequences of the brain and surrounding structures were obtained without intravenous contrast. Angiographic images of the head were obtained using MRA technique without contrast.  COMPARISON:  Prior CT performed earlier on the same  day.  FINDINGS: MRI HEAD FINDINGS  There is a wedge-shaped area of restricted diffusion involving the cortical gray matter of the high left frontal lobe, compatible with acute ischemic infarct (series 4, image 27). This area of infarction measures 9.1 x 15.3 mm, and appears to involve the motor cortex. No associated hemorrhage or significant mass effect. No other intracranial hemorrhage. Gray-white matter differentiation is otherwise maintained.  No mass lesion or midline shift. Patchy T2/FLAIR hyperintensity within the periventricular and deep white matter both cerebral hemispheres is most compatible with chronic small vessel ischemic changes, moderate for patient age. Diffuse prominence of the CSF containing spaces is compatible with generalized atrophy. Ventricles are within normal limits without evidence of hydrocephalus. No extra-axial fluid collection.  Craniocervical junction within normal limits. Pituitary gland unremarkable.  Chronic changes seen at the left globe. No acute  abnormality seen about either orbit.  Signal intensity within the visualized bone marrow is normal. Mild degenerative changes noted within the upper cervical spine.  Paranasal sinuses and mastoid air cells are clear.  MRA HEAD FINDINGS  ANTERIOR CIRCULATION:  Visualized portions of the distal cervical segments of the internal carotid arteries are widely patent with antegrade flow. The petrous, cavernous, and supra clinoid segments are widely patent without hemodynamically significant stenosis.  A1 segments, anterior communicating artery, and anterior cerebral arteries are widely patent without high-grade stenosis or acute abnormality.  M1 segments are widely patent bilaterally without proximal branch occlusion or significant stenosis. MCA bifurcations within normal limits. Distal MCA branches patent bilaterally.  POSTERIOR CIRCULATION:  Left vertebral artery is dominant. Posterior inferior cerebral arteries patent bilaterally. Vertebrobasilar junction and basilar artery within normal limits without high-grade stenosis or proximal branch occlusion. Superior cerebral arteries patent bilaterally. The left superior cerebral artery appears to arise from the left P1 segment. There is a single short segment moderate stenosis within the proximal right posterior cerebral artery.  No aneurysm or vascular malformation identified within the intracranial circulation.  IMPRESSION: MRI HEAD IMPRESSION:  1. Acute ischemic infarct involving the cortical gray matter of the high left frontal lobe as above. No associated mass effect or hemorrhage. 2. Atrophy with moderate chronic microvascular ischemic disease.  MRA HEAD IMPRESSION:  1. No proximal branch occlusion identified within the intracranial circulation. 2. Single moderate short-segment stenosis within the proximal right posterior cerebral artery. No other hemodynamically significant stenosis identified within the intracranial circulation.   Electronically Signed   By: Jeannine Boga M.D.   On: 01/04/2014 23:36   Ct Abdomen Pelvis W Contrast  01/02/2014   CLINICAL DATA:  Recent diverticulitis. Left lower quadrant abdominal pain. Patient on second round of antibiotics.  EXAM: CT ABDOMEN AND PELVIS WITH CONTRAST  TECHNIQUE: Multidetector CT imaging of the abdomen and pelvis was performed using the standard protocol following bolus administration of intravenous contrast.  CONTRAST:  117mL OMNIPAQUE IOHEXOL 300 MG/ML  SOLN  COMPARISON:  01/30/2011  FINDINGS: Lower chest: The lung bases are clear. The heart is normal in size. No pericardial effusion. Coronary artery calcifications are noted.  Hepatobiliary: Mild central intrahepatic and mild common bile duct dilatation likely due to prior cholecystectomy. No focal hepatic lesions.  Pancreas: Normal.  Spleen: Normal.  Adrenals/Urinary Tract: Normal.  Stomach/Bowel: The stomach, duodenum, small bowel and colon are grossly normal. No inflammatory changes, mass lesions or obstructive findings. Moderate diverticulosis of the sigmoid colon. There is a tiny wisp of fluid near the upper sigmoid colon which may be the sequela of prior diverticulitis. The appendix is  normal.  Vascular/Lymphatic: Moderate atherosclerotic calcifications involving the aorta but no focal aneurysm or dissection. The branch vessels are patent. No mesenteric or retroperitoneal mass or lymphadenopathy. Small scattered lymph nodes are noted.  Pelvis: The uterus is surgically absent. The right ovary is still present. The left ovary is not identified for certain. The bladder is normal except for small cystocele. No inguinal mass or adenopathy.  Musculoskeletal: No significant bony findings.  IMPRESSION: No findings for acute diverticulitis. Small wisp of fluid near the upper sigmoid colon is likely the sequela of resolved diverticulitis.  Moderate stool in the ascending and transverse colon.  Small cystocele.   Electronically Signed   By: Kalman Jewels M.D.   On:  01/02/2014 16:02   Nm Myocar Multi W/planar W/wall Motion / Ef  01/22/2014   CLINICAL DATA:  Chest pain, non ST elevated myocardial infarction  EXAM: MYOCARDIAL IMAGING WITH SPECT (REST AND PHARMACOLOGIC-STRESS)  GATED LEFT VENTRICULAR WALL MOTION STUDY  LEFT VENTRICULAR EJECTION FRACTION  TECHNIQUE: Standard myocardial SPECT imaging was performed after resting intravenous injection of 10 mCi Tc-21m sestamibi. Subsequently, intravenous infusion of Lexiscan was performed under the supervision of the Cardiology staff. At peak effect of the drug, 30 mCi Tc-53m sestamibi was injected intravenously and standard myocardial SPECT imaging was performed. Quantitative gated imaging was also performed to evaluate left ventricular wall motion, and estimate left ventricular ejection fraction.  COMPARISON:  None.  FINDINGS: Perfusion: No decreased activity in the left ventricle on stress imaging to suggest reversible ischemia or infarction.  Wall Motion: Normal left ventricular wall motion. No left ventricular dilation.  Left Ventricular Ejection Fraction: 74 %  End diastolic volume 62 ml  End systolic volume 16 ml  IMPRESSION: 1. No reversible ischemia or infarction.  2. Normal left ventricular wall motion.  3. Left ventricular ejection fraction 74%  4. Low-risk stress test findings*.  *2012 Appropriate Use Criteria for Coronary Revascularization Focused Update: J Am Coll Cardiol. 9833;82(5):053-976. http://content.airportbarriers.com.aspx?articleid=1201161   Electronically Signed   By: Daryll Brod M.D.   On: 01/22/2014 15:10   Dg Foot Complete Left  01/11/2014   X-ray examination left foot  Healing oblique fracture fourth metatarsal with minimal proximal lateral  migration of the distal fragment Inferior calcaneal spur  Radiographic impression: Consolidated fracture fourth metatarsal in satisfactory alignment  Mr Jodene Nam Head/brain Wo Cm  01/04/2014   CLINICAL DATA:  Initial evaluation for a sudden onset right arm  weakness. Evaluate for stroke.  EXAM: MRI HEAD WITHOUT CONTRAST  MRA HEAD WITHOUT CONTRAST  TECHNIQUE: Multiplanar, multiecho pulse sequences of the brain and surrounding structures were obtained without intravenous contrast. Angiographic images of the head were obtained using MRA technique without contrast.  COMPARISON:  Prior CT performed earlier on the same day.  FINDINGS: MRI HEAD FINDINGS  There is a wedge-shaped area of restricted diffusion involving the cortical gray matter of the high left frontal lobe, compatible with acute ischemic infarct (series 4, image 27). This area of infarction measures 9.1 x 15.3 mm, and appears to involve the motor cortex. No associated hemorrhage or significant mass effect. No other intracranial hemorrhage. Gray-white matter differentiation is otherwise maintained.  No mass lesion or midline shift. Patchy T2/FLAIR hyperintensity within the periventricular and deep white matter both cerebral hemispheres is most compatible with chronic small vessel ischemic changes, moderate for patient age. Diffuse prominence of the CSF containing spaces is compatible with generalized atrophy. Ventricles are within normal limits without evidence of hydrocephalus. No extra-axial fluid collection.  Craniocervical  junction within normal limits. Pituitary gland unremarkable.  Chronic changes seen at the left globe. No acute abnormality seen about either orbit.  Signal intensity within the visualized bone marrow is normal. Mild degenerative changes noted within the upper cervical spine.  Paranasal sinuses and mastoid air cells are clear.  MRA HEAD FINDINGS  ANTERIOR CIRCULATION:  Visualized portions of the distal cervical segments of the internal carotid arteries are widely patent with antegrade flow. The petrous, cavernous, and supra clinoid segments are widely patent without hemodynamically significant stenosis.  A1 segments, anterior communicating artery, and anterior cerebral arteries are widely  patent without high-grade stenosis or acute abnormality.  M1 segments are widely patent bilaterally without proximal branch occlusion or significant stenosis. MCA bifurcations within normal limits. Distal MCA branches patent bilaterally.  POSTERIOR CIRCULATION:  Left vertebral artery is dominant. Posterior inferior cerebral arteries patent bilaterally. Vertebrobasilar junction and basilar artery within normal limits without high-grade stenosis or proximal branch occlusion. Superior cerebral arteries patent bilaterally. The left superior cerebral artery appears to arise from the left P1 segment. There is a single short segment moderate stenosis within the proximal right posterior cerebral artery.  No aneurysm or vascular malformation identified within the intracranial circulation.  IMPRESSION: MRI HEAD IMPRESSION:  1. Acute ischemic infarct involving the cortical gray matter of the high left frontal lobe as above. No associated mass effect or hemorrhage. 2. Atrophy with moderate chronic microvascular ischemic disease.  MRA HEAD IMPRESSION:  1. No proximal branch occlusion identified within the intracranial circulation. 2. Single moderate short-segment stenosis within the proximal right posterior cerebral artery. No other hemodynamically significant stenosis identified within the intracranial circulation.   Electronically Signed   By: Jeannine Boga M.D.   On: 01/04/2014 23:36    Microbiology: No results found for this or any previous visit (from the past 240 hour(s)).   Labs: Basic Metabolic Panel:  Recent Labs Lab 01/19/14 2028 01/21/14 0431  NA 140 144  K 4.3 4.1  CL 100 106  CO2 29 28  GLUCOSE 103* 95  BUN 18 16  CREATININE 0.61 0.64  CALCIUM 9.7 8.9   Liver Function Tests: No results for input(s): AST, ALT, ALKPHOS, BILITOT, PROT, ALBUMIN in the last 168 hours. No results for input(s): LIPASE, AMYLASE in the last 168 hours. No results for input(s): AMMONIA in the last 168  hours. CBC:  Recent Labs Lab 01/19/14 2028 01/21/14 0431 01/22/14 0408  WBC 5.4 5.7 4.9  HGB 14.7 13.5 13.2  HCT 44.8 41.6 39.9  MCV 93.9 93.9 93.4  PLT 192 171 156   Cardiac Enzymes:  Recent Labs Lab 01/19/14 2020 01/20/14 0120 01/20/14 0929 01/20/14 1300  TROPONINI <0.30 0.87* 2.70* 1.27*   BNP: BNP (last 3 results) No results for input(s): PROBNP in the last 8760 hours. CBG: No results for input(s): GLUCAP in the last 168 hours.     SignedEulogio Bear  [facility] 01/22/2014, 6:03 PM

## 2014-01-22 NOTE — Clinical Documentation Improvement (Signed)
Risk Factors: CHF noted per 10/31 progress notes.   . Document acuity --Acute --Chronic --Acute on Chronic . Document type --Diastolic --Systolic --Combined systolic and diastolic  . Due to or associated with --Cardiac or other surgery --Hypertension --Valvular disease --Rheumatic heart disease Endocarditis (valvitis) Pericarditis Myocarditis --Other (specify)   Thank [name] [name] Documentation Specialist [phone] [name]

## 2014-01-22 NOTE — Progress Notes (Signed)
Discharge education, medication, prescriptions, and follow-up appts reviewed with pt and pts children, all satted understanding, family spoke to Dr. Eliseo Squires on phone concerning discharge questions, pt and pts family very worried and confused about not speaking to an MD before discharge and that no one came to bedside, pt stated she was very annoyed at process, IV and tele removed Rickard Rhymes, RN

## 2014-01-22 NOTE — Progress Notes (Signed)
Patient has what appears to be a blood blister inside her rt check. It is slightly raised but not causing her any pain.

## 2014-01-22 NOTE — Progress Notes (Signed)
Patient called me to room because she was spitting out a small amount of blood. Blister like area inside rt check no longer raised and does not look to be actively bleeding at this time. Patient rinsed mouth with some salt water. Will continue to monitor.

## 2014-01-22 NOTE — Progress Notes (Signed)
Patient ID: [name] [name], female   DOB: 1925/02/14, 80 y.o.   MRN: [id number]   Patient Name: [name] [name] Date of Encounter: 01/22/2014     Principal Problem:   Chest pain at rest Active Problems:   Malignant hypertension   Hypertensive urgency, malignant   NSTEMI (non-ST elevated myocardial infarction)    SUBJECTIVE  Some mild chest discomfort overnight. Complains of mouth sore with some bleeding.  CURRENT MEDS . aspirin  324 mg Oral Once  . aspirin EC  81 mg Oral Daily  . atorvastatin  40 mg Oral q1800  . clopidogrel  75 mg Oral Daily  . isosorbide mononitrate  30 mg Oral Daily  . nebivolol  5 mg Oral BID  . prednisoLONE acetate  1 drop Both Eyes BID    OBJECTIVE  Filed Vitals:   01/21/14 0531 01/21/14 1744 01/21/14 1900 01/22/14 0620  BP: 134/58 132/62 149/61 156/54  Pulse: 60 62 69 70  Temp: 97.4 F (36.3 C) 98 F (36.7 C) 97.5 F (36.4 C) 98 F (36.7 C)  TempSrc: Oral Oral Oral Oral  Resp: 18 18 18 18   Height:      Weight:      SpO2: 97% 97% 96% 94%    Intake/Output Summary (Last 24 hours) at 01/22/14 0833 Last data filed at 01/21/14 1831  Gross per 24 hour  Intake  583.2 ml  Output      1 ml  Net  582.2 ml   Filed Weights   01/19/14 2011 01/20/14 0032  Weight: 165 lb (74.844 kg) 170 lb 1.6 oz (77.157 kg)    PHYSICAL EXAM  General: Pleasant, elderly woman, NAD. Neuro: Alert and oriented X 3. Moves all extremities spontaneously. Psych: Normal affect. HEENT:  There is a small sore on the right buccal mucosal, with surrounding ecchymosis, no active bleeding  Neck: Supple without bruits or JVD. Lungs:  Resp regular and unlabored, CTA. Heart: RRR no s3, s4, or murmurs. Abdomen: Soft, non-tender, non-distended, BS + x 4.  Extremities: No clubbing, cyanosis or edema. DP/PT/Radials 2+ and equal bilaterally.  Accessory Clinical Findings  CBC  Recent Labs  01/21/14 0431 01/22/14 0408  WBC 5.7 4.9  HGB 13.5 13.2  HCT 41.6 39.9    MCV 93.9 93.4  PLT 171 263   Basic Metabolic Panel  Recent Labs  01/19/14 2028 01/21/14 0431  NA 140 144  K 4.3 4.1  CL 100 106  CO2 29 28  GLUCOSE 103* 95  BUN 18 16  CREATININE 0.61 0.64  CALCIUM 9.7 8.9   Liver Function Tests No results for input(s): AST, ALT, ALKPHOS, BILITOT, PROT, ALBUMIN in the last 72 hours. No results for input(s): LIPASE, AMYLASE in the last 72 hours. Cardiac Enzymes  Recent Labs  01/20/14 0120 01/20/14 0929 01/20/14 1300  TROPONINI 0.87* 2.70* 1.27*   BNP Invalid input(s): POCBNP D-Dimer No results for input(s): DDIMER in the last 72 hours. Hemoglobin A1C No results for input(s): HGBA1C in the last 72 hours. Fasting Lipid Panel No results for input(s): CHOL, HDL, LDLCALC, TRIG, CHOLHDL, LDLDIRECT in the last 72 hours. Thyroid Function Tests No results for input(s): TSH, T4TOTAL, T3FREE, THYROIDAB in the last 72 hours.  Invalid input(s): FREET3  TELE  nsr  Radiology/Studies  Dg Chest 2 View  01/19/2014   CLINICAL DATA:  Left-sided chest pain.  EXAM: CHEST  2 VIEW  COMPARISON:  01/04/2014  FINDINGS: Heart size and pulmonary vascularity are normal. There is tortuosity of  the thoracic aorta. No infiltrates or effusions. Loop recorder in place. No acute osseous abnormality. Slight chronic accentuation of the thoracic kyphosis.  IMPRESSION: No acute abnormalities.   Electronically Signed   By: Rozetta Nunnery M.D.   On: 01/19/2014 21:14   Dg Chest 2 View  01/04/2014   CLINICAL DATA:  Stroke.  Initial encounter.  EXAM: CHEST  2 VIEW  COMPARISON:  12/23/2012  FINDINGS: There is mild cardiomegaly which is chronic. Mediastinal contours are stable from previous, with apparent upper mediastinal widening related to apical lordotic positioning. There is no edema, consolidation, effusion, or pneumothorax.  IMPRESSION: No active cardiopulmonary disease.   Electronically Signed   By: Jorje Guild M.D.   On: 01/04/2014 23:27   Ct Head Wo  Contrast  01/04/2014   CLINICAL DATA:  Right-sided facial droop, slurred speech.  EXAM: CT HEAD WITHOUT CONTRAST  TECHNIQUE: Contiguous axial images were obtained from the base of the skull through the vertex without intravenous contrast.  COMPARISON:  CT scan of October 12, 2007.  FINDINGS: Bony calvarium appears intact. Mild diffuse cortical atrophy is noted. Mild chronic ischemic white matter disease is noted. No mass effect or midline shift is noted. Ventricular size is within normal limits. There is no evidence of mass lesion, hemorrhage or acute infarction.  IMPRESSION: Mild diffuse cortical atrophy. Mild chronic ischemic white matter disease. No acute intracranial abnormality seen. Critical Value/emergent results were called by telephone at the time of interpretation on 01/04/2014 at 2:38 pm to Dr. Noemi Chapel, who verbally acknowledged these results.   Electronically Signed   By: Sabino Dick M.D.   On: 01/04/2014 14:39   Mr Brain Wo Contrast  01/04/2014   CLINICAL DATA:  Initial evaluation for a sudden onset right arm weakness. Evaluate for stroke.  EXAM: MRI HEAD WITHOUT CONTRAST  MRA HEAD WITHOUT CONTRAST  TECHNIQUE: Multiplanar, multiecho pulse sequences of the brain and surrounding structures were obtained without intravenous contrast. Angiographic images of the head were obtained using MRA technique without contrast.  COMPARISON:  Prior CT performed earlier on the same day.  FINDINGS: MRI HEAD FINDINGS  There is a wedge-shaped area of restricted diffusion involving the cortical gray matter of the high left frontal lobe, compatible with acute ischemic infarct (series 4, image 27). This area of infarction measures 9.1 x 15.3 mm, and appears to involve the motor cortex. No associated hemorrhage or significant mass effect. No other intracranial hemorrhage. Gray-white matter differentiation is otherwise maintained.  No mass lesion or midline shift. Patchy T2/FLAIR hyperintensity within the  periventricular and deep white matter both cerebral hemispheres is most compatible with chronic small vessel ischemic changes, moderate for patient age. Diffuse prominence of the CSF containing spaces is compatible with generalized atrophy. Ventricles are within normal limits without evidence of hydrocephalus. No extra-axial fluid collection.  Craniocervical junction within normal limits. Pituitary gland unremarkable.  Chronic changes seen at the left globe. No acute abnormality seen about either orbit.  Signal intensity within the visualized bone marrow is normal. Mild degenerative changes noted within the upper cervical spine.  Paranasal sinuses and mastoid air cells are clear.  MRA HEAD FINDINGS  ANTERIOR CIRCULATION:  Visualized portions of the distal cervical segments of the internal carotid arteries are widely patent with antegrade flow. The petrous, cavernous, and supra clinoid segments are widely patent without hemodynamically significant stenosis.  A1 segments, anterior communicating artery, and anterior cerebral arteries are widely patent without high-grade stenosis or acute abnormality.  M1 segments are widely  patent bilaterally without proximal branch occlusion or significant stenosis. MCA bifurcations within normal limits. Distal MCA branches patent bilaterally.  POSTERIOR CIRCULATION:  Left vertebral artery is dominant. Posterior inferior cerebral arteries patent bilaterally. Vertebrobasilar junction and basilar artery within normal limits without high-grade stenosis or proximal branch occlusion. Superior cerebral arteries patent bilaterally. The left superior cerebral artery appears to arise from the left P1 segment. There is a single short segment moderate stenosis within the proximal right posterior cerebral artery.  No aneurysm or vascular malformation identified within the intracranial circulation.  IMPRESSION: MRI HEAD IMPRESSION:  1. Acute ischemic infarct involving the cortical gray matter of  the high left frontal lobe as above. No associated mass effect or hemorrhage. 2. Atrophy with moderate chronic microvascular ischemic disease.  MRA HEAD IMPRESSION:  1. No proximal branch occlusion identified within the intracranial circulation. 2. Single moderate short-segment stenosis within the proximal right posterior cerebral artery. No other hemodynamically significant stenosis identified within the intracranial circulation.   Electronically Signed   By: Jeannine Boga M.D.   On: 01/04/2014 23:36   Ct Abdomen Pelvis W Contrast  01/02/2014   CLINICAL DATA:  Recent diverticulitis. Left lower quadrant abdominal pain. Patient on second round of antibiotics.  EXAM: CT ABDOMEN AND PELVIS WITH CONTRAST  TECHNIQUE: Multidetector CT imaging of the abdomen and pelvis was performed using the standard protocol following bolus administration of intravenous contrast.  CONTRAST:  16mL OMNIPAQUE IOHEXOL 300 MG/ML  SOLN  COMPARISON:  01/30/2011  FINDINGS: Lower chest: The lung bases are clear. The heart is normal in size. No pericardial effusion. Coronary artery calcifications are noted.  Hepatobiliary: Mild central intrahepatic and mild common bile duct dilatation likely due to prior cholecystectomy. No focal hepatic lesions.  Pancreas: Normal.  Spleen: Normal.  Adrenals/Urinary Tract: Normal.  Stomach/Bowel: The stomach, duodenum, small bowel and colon are grossly normal. No inflammatory changes, mass lesions or obstructive findings. Moderate diverticulosis of the sigmoid colon. There is a tiny wisp of fluid near the upper sigmoid colon which may be the sequela of prior diverticulitis. The appendix is normal.  Vascular/Lymphatic: Moderate atherosclerotic calcifications involving the aorta but no focal aneurysm or dissection. The branch vessels are patent. No mesenteric or retroperitoneal mass or lymphadenopathy. Small scattered lymph nodes are noted.  Pelvis: The uterus is surgically absent. The right ovary is  still present. The left ovary is not identified for certain. The bladder is normal except for small cystocele. No inguinal mass or adenopathy.  Musculoskeletal: No significant bony findings.  IMPRESSION: No findings for acute diverticulitis. Small wisp of fluid near the upper sigmoid colon is likely the sequela of resolved diverticulitis.  Moderate stool in the ascending and transverse colon.  Small cystocele.   Electronically Signed   By: Kalman Jewels M.D.   On: 01/02/2014 16:02   Dg Foot Complete Left  01/11/2014   X-ray examination left foot  Healing oblique fracture fourth metatarsal with minimal proximal lateral  migration of the distal fragment Inferior calcaneal spur  Radiographic impression: Consolidated fracture fourth metatarsal in satisfactory alignment  Mr Jodene Nam Head/brain Wo Cm  01/04/2014   CLINICAL DATA:  Initial evaluation for a sudden onset right arm weakness. Evaluate for stroke.  EXAM: MRI HEAD WITHOUT CONTRAST  MRA HEAD WITHOUT CONTRAST  TECHNIQUE: Multiplanar, multiecho pulse sequences of the brain and surrounding structures were obtained without intravenous contrast. Angiographic images of the head were obtained using MRA technique without contrast.  COMPARISON:  Prior CT performed earlier on the same  day.  FINDINGS: MRI HEAD FINDINGS  There is a wedge-shaped area of restricted diffusion involving the cortical gray matter of the high left frontal lobe, compatible with acute ischemic infarct (series 4, image 27). This area of infarction measures 9.1 x 15.3 mm, and appears to involve the motor cortex. No associated hemorrhage or significant mass effect. No other intracranial hemorrhage. Gray-white matter differentiation is otherwise maintained.  No mass lesion or midline shift. Patchy T2/FLAIR hyperintensity within the periventricular and deep white matter both cerebral hemispheres is most compatible with chronic small vessel ischemic changes, moderate for patient age. Diffuse prominence  of the CSF containing spaces is compatible with generalized atrophy. Ventricles are within normal limits without evidence of hydrocephalus. No extra-axial fluid collection.  Craniocervical junction within normal limits. Pituitary gland unremarkable.  Chronic changes seen at the left globe. No acute abnormality seen about either orbit.  Signal intensity within the visualized bone marrow is normal. Mild degenerative changes noted within the upper cervical spine.  Paranasal sinuses and mastoid air cells are clear.  MRA HEAD FINDINGS  ANTERIOR CIRCULATION:  Visualized portions of the distal cervical segments of the internal carotid arteries are widely patent with antegrade flow. The petrous, cavernous, and supra clinoid segments are widely patent without hemodynamically significant stenosis.  A1 segments, anterior communicating artery, and anterior cerebral arteries are widely patent without high-grade stenosis or acute abnormality.  M1 segments are widely patent bilaterally without proximal branch occlusion or significant stenosis. MCA bifurcations within normal limits. Distal MCA branches patent bilaterally.  POSTERIOR CIRCULATION:  Left vertebral artery is dominant. Posterior inferior cerebral arteries patent bilaterally. Vertebrobasilar junction and basilar artery within normal limits without high-grade stenosis or proximal branch occlusion. Superior cerebral arteries patent bilaterally. The left superior cerebral artery appears to arise from the left P1 segment. There is a single short segment moderate stenosis within the proximal right posterior cerebral artery.  No aneurysm or vascular malformation identified within the intracranial circulation.  IMPRESSION: MRI HEAD IMPRESSION:  1. Acute ischemic infarct involving the cortical gray matter of the high left frontal lobe as above. No associated mass effect or hemorrhage. 2. Atrophy with moderate chronic microvascular ischemic disease.  MRA HEAD IMPRESSION:  1. No  proximal branch occlusion identified within the intracranial circulation. 2. Single moderate short-segment stenosis within the proximal right posterior cerebral artery. No other hemodynamically significant stenosis identified within the intracranial circulation.   Electronically Signed   By: Jeannine Boga M.D.   On: 01/04/2014 23:36    ASSESSMENT:  1. Chest pain  2. Recent stroke 3. NSTEMI  PLAN: 1. Plan for lexiscan NST today. Mild discomfort overnight. High likelihood of CAD given age. Nuclear stress test is to determine the extent of myocardial risk and help decide whether to be aggressive or conservative with regards to management.   Pixie Casino, MD, [facility] Attending Cardiologist [facility]   01/22/2014 8:33 AM

## 2014-01-22 NOTE — Progress Notes (Signed)
Lexiscan CL performed 

## 2014-01-23 ENCOUNTER — Telehealth: Payer: Self-pay | Admitting: Cardiovascular Disease

## 2014-01-23 NOTE — Telephone Encounter (Signed)
New problem   Pt want to know results of stress test she had done in hospital yesterday. Please call pt.

## 2014-01-23 NOTE — Telephone Encounter (Signed)
F/u   Pt's daughter stated pt doesn't feel like getting up today going to the bathroom. Please call pt.

## 2014-01-23 NOTE — Care Management Note (Signed)
    Page 1 of 1   01/23/2014     4:57:53 PM CARE MANAGEMENT NOTE 01/23/2014  Patient:  [name] [name]   Account Number:  [id number]  Date Initiated:  01/22/2014  Documentation initiated by:  [name] [name]  Subjective/Objective Assessment:   Pt adm with CP, pos trop on 10/30.     Action/Plan:   Anticipated DC Date:  01/22/2014   Anticipated DC Plan:  HOME/SELF CARE         Choice offered to / List presented to:             Status of service:   Medicare Important Message given?  YES (If response is "NO", the following Medicare IM given date fields will be blank) Date Medicare IM given:  01/22/2014 Medicare IM given by:  [name] [name] Date Additional Medicare IM given:   Additional Medicare IM given by:    Discharge Disposition:  HOME/SELF CARE  Per UR Regulation:  Reviewed for med. necessity/level of care/duration of stay  If discussed at [place] of Stay Meetings, dates discussed:    Comments:

## 2014-01-23 NOTE — Telephone Encounter (Signed)
Chart reviewed. Would continue with plan as outlined. Despite positive troponin, reassuring that stress nuclear study was normal. She should have cardiology follow-up arranged. thx  [name] 01/23/2014 4:48 PM

## 2014-01-23 NOTE — Telephone Encounter (Signed)
I spoke with the pt's daughter and made her aware of stress test results. The pt was restarted on Simvastatin during hospitalization and in general is not feeling well.  The pt's head feels funny, feels dizzy and weak.  I advised that she hold Simvastatin for one week and then notify the office to make [place] aware if symptoms have improved.  The pt and daughter would like to make sure that Dr Burt Knack knows she had an MI. The hospitalist discharged the pt from the hospital and did not arrange any follow-up.  I have scheduled the pt to see Dr Burt Knack in January per recall.  I will have Dr Burt Knack review the pt's chart and give further recommendations if needed.

## 2014-01-24 ENCOUNTER — Encounter: Payer: Self-pay | Admitting: Internal Medicine

## 2014-01-24 ENCOUNTER — Telehealth: Payer: Self-pay | Admitting: Cardiovascular Disease

## 2014-01-27 ENCOUNTER — Telehealth: Payer: Self-pay | Admitting: Physician Assistant

## 2014-01-27 NOTE — Telephone Encounter (Signed)
Ms Sottile called because she is having some chest discomfort and tingling in her left arm. She feels these symptoms are not as bad as before her CVA or MI, but they are similar and she is concerned.   Advised her that she should probably be evaluated, but the only way to make that happen was for her to come to the emergency room.   The patient was reluctant, but agreed that her symptoms were concerning. She also agreed that since she had had both a stroke and an MI within the last month, she might need to be seen.  Hopefully she will decide to come in.  Rosaria Ferries, PA-C 01/27/2014 2:27 PM Beeper [phone]

## 2014-01-29 NOTE — Telephone Encounter (Signed)
Closed encounter °

## 2014-02-02 ENCOUNTER — Ambulatory Visit (INDEPENDENT_AMBULATORY_CARE_PROVIDER_SITE_OTHER): Payer: Medicare Other | Admitting: Internal Medicine

## 2014-02-02 ENCOUNTER — Encounter: Payer: Self-pay | Admitting: Internal Medicine

## 2014-02-02 VITALS — BP 120/60 | HR 62 | Ht 62.0 in | Wt 169.0 lb

## 2014-02-02 DIAGNOSIS — R1013 Epigastric pain: Secondary | ICD-10-CM

## 2014-02-02 DIAGNOSIS — R1032 Left lower quadrant pain: Secondary | ICD-10-CM

## 2014-02-02 MED ORDER — RANITIDINE HCL 150 MG PO TABS: 150.0000 mg | ORAL_TABLET | Freq: Every day | ORAL | Status: DC

## 2014-02-02 NOTE — Patient Instructions (Addendum)
Please purchase the following medications over the counter and take as directed: Benefiber 1 tablespoon once daily Gaviscon-chew 1-2 tablets every 4 hours as needed for gas/belching  We have sent the following medications to your pharmacy for you to pick up at your convenience: Ranitidine 150 mg every night Dr Darcus Austin

## 2014-02-02 NOTE — Progress Notes (Signed)
[name] [name] 1924/06/14 [id number]  Note: This dictation was prepared with Dragon digital system. Any transcriptional errors that result from this procedure are unintentional.   History of Present Illness:  This is an 80 year old white female with symptomatic diverticulosis and history of diverticulitis. She was seen last month by Alonza Bogus, PA-C for lower abdominal pain and was treated with Augmentin and subsequently with Flagyl 500 mg twice a day. A CT scan of the abdomen showed mild intrahepatic duct dilation likely secondary to prior cholecystectomy with some mesenteric vascular calcifications and a small amount of fluid collection around the sigmoid diverticulosis. There was a moderate amount of stool in her colon. In the interim, she was admitted with a CVA and MI and was hospitalized from 01/19/14-01/22/2014. She was discharged and scheduled for a stress test. Her ejection fraction is normal. She is on Plavix and aspirin. Her left lower quadrant abdominal pain is better but she has had a lot of belching and indigestion.    Past Medical History  Diagnosis Date  . Arthritis   . Blind left eye     Corneal transplant x3 with rejection  . Dysrhythmia   . Varicose veins     both legs and left fooot at arch  . Complication of anesthesia     sensitive to meds, bp can fall  . SVT (supraventricular tachycardia) 12/15/2011    seen by Dr. Caryl Comes  . Helicobacter pylori (H. pylori) 05/22/02    RUT-Positive  . Gastritis   . Diverticulosis   . Stroke     Past Surgical History  Procedure Laterality Date  . Cholecystectomy    . Tonsillectomy    . Tonsillectomy    . Corneal transplant  right 2009, left 2009    both eyes, 3 in left eye, 1 in right eye  . Cataract surgery  2005    both eyes  . Abdominal hysterectomy   sept 1985    ovaries removed  . Total knee arthroplasty Left 06/27/2012    Procedure: LEFT TOTAL KNEE ARTHROPLASTY;  Surgeon: Gearlean Alf, MD;  Location: [facility];  Service:  Orthopedics;  Laterality: Left;  . Tubal ligation    . Total knee arthroplasty Right 12/12/2012    Procedure: RIGHT TOTAL KNEE ARTHROPLASTY;  Surgeon: Gearlean Alf, MD;  Location: [facility];  Service: Orthopedics;  Laterality: Right;    Allergies  Allergen Reactions  . Ultram [Tramadol] Hives  . Buprenex [Buprenorphine Hcl] Other (See Comments)    Heart raced  . Other Swelling    Yellow eye dye that is used to check retina. Caused swelling of tongue.  . Augmentin [Amoxicillin-Pot Clavulanate] Nausea And Vomiting  . Levofloxacin Other (See Comments)    levaquin unknown reaction  . Percocet [Oxycodone-Acetaminophen] Nausea And Vomiting and Other (See Comments)    "felt drained"  . Sulfa Antibiotics Other (See Comments)    unknown  . Tape Other (See Comments)    Adhesive tape causes skin to pull off  . Zetia [Ezetimibe] Other (See Comments)    unknown    Family history and social history have been reviewed.  Review of Systems: denies dysphagia. Heartburn. Positive for belching and gas. Stools are twice or 3 times a day. She denies constipation  The remainder of the 10 point ROS is negative except as outlined in the H&P  Physical Exam: General Appearance Well developed, in no distress Eyes  Non icteric ,hematoma left lower lid HEENT  Non traumatic, normocephalic  Mouth No lesion,  tongue papillated, no cheilosis Neck Supple without adenopathy, thyroid not enlarged, no carotid bruits, no JVD Lungs Clear to auscultation bilaterally COR Normal S1, normal S2, regular rhythm, no murmur, quiet precordium Abdomen soft and tender in left lower quadrant without rebound or fullness. Normoactive bowel sounds. Post cholecystectomy scar. Post hysterectomy scar Rectal not done Extremities  No pedal edema Skin No lesions,ecchymoses hands Neurological Alert and oriented x 3 Psychological Normal mood and affect  Assessment and Plan:   Problem #76 80 year old white female with symptomatic  diverticulosis. Her last colonoscopy was in 2004. She had a recent CVA and MI. She is not a good candidate for extensive diagnostic GI workup. We will start her on Benefiber 1 tablespoon daily.  Problem #2 Belching and dyspepsia. She is status post remote cholecystectomy with mild intrahepatic dilation related to prior surgery. On her last endoscopy in 2004, she had gastritis. We will start her on ranitidine 150 mg a day and Gaviscon 1-2 tablets every 4 hours when necessary for belching. She will return when necessary.    [name] 02/02/2014

## 2014-02-05 ENCOUNTER — Ambulatory Visit: Payer: Medicare Other | Admitting: Physician Assistant

## 2014-02-05 ENCOUNTER — Ambulatory Visit (INDEPENDENT_AMBULATORY_CARE_PROVIDER_SITE_OTHER): Payer: Medicare Other | Admitting: Physician Assistant

## 2014-02-05 ENCOUNTER — Encounter: Payer: Self-pay | Admitting: Physician Assistant

## 2014-02-05 ENCOUNTER — Ambulatory Visit (INDEPENDENT_AMBULATORY_CARE_PROVIDER_SITE_OTHER): Payer: Medicare Other | Admitting: *Deleted

## 2014-02-05 VITALS — BP 132/80 | HR 66 | Ht 62.5 in | Wt 170.4 lb

## 2014-02-05 DIAGNOSIS — I639 Cerebral infarction, unspecified: Secondary | ICD-10-CM

## 2014-02-05 DIAGNOSIS — I5033 Acute on chronic diastolic (congestive) heart failure: Secondary | ICD-10-CM

## 2014-02-05 MED ORDER — ISOSORBIDE MONONITRATE ER 60 MG PO TB24: 60.0000 mg | ORAL_TABLET | Freq: Every day | ORAL | Status: DC

## 2014-02-05 MED ORDER — NITROGLYCERIN 0.4 MG SL SUBL: 0.4000 mg | SUBLINGUAL_TABLET | SUBLINGUAL | Status: DC | PRN

## 2014-02-05 NOTE — Assessment & Plan Note (Signed)
Patient has a loop recorder and there is no evidence of SVT or atrial fibrillation at this time.

## 2014-02-05 NOTE — Patient Instructions (Signed)
Your physician has recommended you make the following change in your medication:   Start taking IMDUR 60 MG ONCE A DAY   START TAKING NITROGLYCERIN AS NEEDED FOR CHEST PAIN   KEEP FOLLOW UP APPOINTMENT WITH DR Burt Knack IN January      Nitroglycerin sublingual tablets What is this medicine? NITROGLYCERIN (nye troe GLI ser in) is a type of vasodilator. It relaxes blood vessels, increasing the blood and oxygen supply to your heart. This medicine is used to relieve chest pain caused by angina. It is also used to prevent chest pain before activities like climbing stairs, going outdoors in cold weather, or sexual activity. This medicine may be used for other purposes; ask your health care provider or pharmacist if you have questions. COMMON BRAND NAME(S): Nitroquick, Nitrostat, Nitrotab What should I tell my health care provider before I take this medicine? They need to know if you have any of these conditions: -anemia -head injury, recent stroke, or bleeding in the brain -liver disease -previous heart attack -an unusual or allergic reaction to nitroglycerin, other medicines, foods, dyes, or preservatives -pregnant or trying to get pregnant -breast-feeding How should I use this medicine? Take this medicine by mouth as needed. At the first sign of an angina attack (chest pain or tightness) place one tablet under your tongue. You can also take this medicine 5 to 10 minutes before an event likely to produce chest pain. Follow the directions on the prescription label. Let the tablet dissolve under the tongue. Do not swallow whole. Replace the dose if you accidentally swallow it. It will help if your mouth is not dry. Saliva around the tablet will help it to dissolve more quickly. Do not eat or drink, smoke or chew tobacco while a tablet is dissolving. If you are not better within 5 minutes after taking ONE dose of nitroglycerin, call 9-1-1 immediately to seek emergency medical care. Do not take more  than 3 nitroglycerin tablets over 15 minutes. If you take this medicine often to relieve symptoms of angina, your doctor or health care professional may provide you with different instructions to manage your symptoms. If symptoms do not go away after following these instructions, it is important to call 9-1-1 immediately. Do not take more than 3 nitroglycerin tablets over 15 minutes. Talk to your pediatrician regarding the use of this medicine in children. Special care may be needed. Overdosage: If you think you have taken too much of this medicine contact a poison control center or emergency room at once. NOTE: This medicine is only for you. Do not share this medicine with others. What if I miss a dose? This does not apply. This medicine is only used as needed. What may interact with this medicine? Do not take this medicine with any of the following medications: -certain migraine medicines like ergotamine and dihydroergotamine (DHE) -medicines used to treat erectile dysfunction like sildenafil, tadalafil, and vardenafil -riociguat This medicine may also interact with the following medications: -alteplase -aspirin -heparin -medicines for high blood pressure -medicines for mental depression -other medicines used to treat angina -phenothiazines like chlorpromazine, mesoridazine, prochlorperazine, thioridazine This list may not describe all possible interactions. Give your health care provider a list of all the medicines, herbs, non-prescription drugs, or dietary supplements you use. Also tell them if you smoke, drink alcohol, or use illegal drugs. Some items may interact with your medicine. What should I watch for while using this medicine? Tell your doctor or health care professional if you feel your medicine  is no longer working. Keep this medicine with you at all times. Sit or lie down when you take your medicine to prevent falling if you feel dizzy or faint after using it. Try to remain  calm. This will help you to feel better faster. If you feel dizzy, take several deep breaths and lie down with your feet propped up, or bend forward with your head resting between your knees. You may get drowsy or dizzy. Do not drive, use machinery, or do anything that needs mental alertness until you know how this drug affects you. Do not stand or sit up quickly, especially if you are an older patient. This reduces the risk of dizzy or fainting spells. Alcohol can make you more drowsy and dizzy. Avoid alcoholic drinks. Do not treat yourself for coughs, colds, or pain while you are taking this medicine without asking your doctor or health care professional for advice. Some ingredients may increase your blood pressure. What side effects may I notice from receiving this medicine? Side effects that you should report to your doctor or health care professional as soon as possible: -blurred vision -dry mouth -skin rash -sweating -the feeling of extreme pressure in the head -unusually weak or tired Side effects that usually do not require medical attention (report to your doctor or health care professional if they continue or are bothersome): -flushing of the face or neck -headache -irregular heartbeat, palpitations -nausea, vomiting This list may not describe all possible side effects. Call your doctor for medical advice about side effects. You may report side effects to FDA at 1-800-FDA-1088. Where should I keep my medicine? Keep out of the reach of children. Store at room temperature between 20 and 25 degrees C (68 and 77 degrees F). Store in [facility] of [employer]. Protect from light and moisture. Keep tightly closed. Throw away any unused medicine after the expiration date. NOTE: This sheet is a summary. It may not cover all possible information. If you have questions about this medicine, talk to your doctor, pharmacist, or health care provider.  2015, [employer] Standard. (2013-01-05  17:57:36)

## 2014-02-05 NOTE — Progress Notes (Signed)
CBU:LAGT is an 80 year old female patient of Dr. Burt Knack who was admitted to the hospital with a NSTEMI . The patient wanted conservative therapy and did not want to proceed with cardiac catheterization. Lexi scan Myoview was performed it was considered low-risk.EF 74%. She had a CVA with left-sided weakness  2 weeks prior to this. A loop recorder was placed by Dr. Rayann Heman. Patient also has a history of hypertension and SVT.2-D echo showed normal LV function with no regional wall motion abnormalities. She had mild AI.  Patient comes in today for followup and admits to having chest tightness and pressure occasionally going down her left arm when she is in a hurry or overexerts herself. She loves to garden and was carrying heavy plants in to the house because of the frost and had some chest tightness.It eases quickly with rest. She was not given a prescription for nitroglycerin. She denies any palpitations, dyspnea, dizziness or presyncope. She occasionally has dyspnea on exertion and overall fatigue.  Current loop recorder has not detected any arrhythmias.  Allergies  Allergen Reactions  . Ultram [Tramadol] Hives  . Buprenex [Buprenorphine Hcl] Other (See Comments)    Heart raced  . Other Swelling    Yellow eye dye that is used to check retina. Caused swelling of tongue.  . Augmentin [Amoxicillin-Pot Clavulanate] Nausea And Vomiting  . Levofloxacin Other (See Comments)    levaquin unknown reaction  . Percocet [Oxycodone-Acetaminophen] Nausea And Vomiting and Other (See Comments)    "felt drained"  . Sulfa Antibiotics Other (See Comments)    unknown  . Tape Other (See Comments)    Adhesive tape causes skin to pull off  . Zetia [Ezetimibe] Other (See Comments)    unknown     Current Outpatient Prescriptions  Medication Sig Dispense Refill  . aspirin EC 81 MG EC tablet Take 1 tablet (81 mg total) by mouth daily.    . clopidogrel (PLAVIX) 75 MG tablet Take 1 tablet (75 mg  total) by mouth daily. 30 tablet 0  . Melatonin 1 MG SUBL Place 1 mg under the tongue at bedtime.    . Multiple Vitamin (MULTI VITAMIN DAILY PO) Take 1 tablet by mouth every morning.    . nebivolol (BYSTOLIC) 5 MG tablet Take 1 tablet (5 mg total) by mouth 2 (two) times daily. 60 tablet 6  . Polyethyl Glycol-Propyl Glycol (SYSTANE OP) Place 1 drop into both eyes as needed (for itching eyes).    . prednisoLONE acetate (PRED FORTE) 1 % ophthalmic suspension Place 1 drop into both eyes 2 (two) times daily.     . ranitidine (ZANTAC) 150 MG tablet Take 1 tablet (150 mg total) by mouth at bedtime. 30 tablet 3   No current facility-administered medications for this visit.    Past Medical History  Diagnosis Date  . Arthritis   . Blind left eye     Corneal transplant x3 with rejection  . Dysrhythmia   . Varicose veins     both legs and left fooot at arch  . Complication of anesthesia     sensitive to meds, bp can fall  . SVT (supraventricular tachycardia) 12/15/2011    seen by Dr. Caryl Comes  . Helicobacter pylori (H. pylori) 05/22/02    RUT-Positive  . Gastritis   . Diverticulosis   . Stroke     Past Surgical History  Procedure Laterality Date  . Cholecystectomy    . Tonsillectomy    . Tonsillectomy    .  Corneal transplant  right 2009, left 2009    both eyes, 3 in left eye, 1 in right eye  . Cataract surgery  2005    both eyes  . Abdominal hysterectomy   sept 1985    ovaries removed  . Total knee arthroplasty Left 06/27/2012    Procedure: LEFT TOTAL KNEE ARTHROPLASTY;  Surgeon: Gearlean Alf, MD;  Location: [facility];  Service: Orthopedics;  Laterality: Left;  . Tubal ligation    . Total knee arthroplasty Right 12/12/2012    Procedure: RIGHT TOTAL KNEE ARTHROPLASTY;  Surgeon: Gearlean Alf, MD;  Location: [facility];  Service: Orthopedics;  Laterality: Right;    Family History  Problem Relation Age of Onset  . Stroke Mother   . Heart attack Father   . Heart attack Brother   . Cancer  Brother     History   Social History  . Marital Status: Widowed    Spouse Name: N/A    Number of Children: N/A  . Years of Education: N/A   Occupational History  . Not on file.   Social History Main Topics  . Smoking status: Never Smoker   . Smokeless tobacco: Never Used  . Alcohol Use: No  . Drug Use: No  . Sexual Activity: No   Other Topics Concern  . Not on file   Social History Narrative    ROS:see history of present illness otherwise negative  BP 132/80 mmHg  Pulse 66  Ht 5' 2.5" (1.588 m)  Wt 170 lb 6.4 oz (77.293 kg)  BMI 30.65 kg/m2  PHYSICAL EXAM: Obese, in no acute distress. Neck: No JVD, HJR, Bruit, or thyroid enlargement  Lungs: No tachypnea, clear without wheezing, rales, or rhonchi  Cardiovascular: small bruise where loop recorder was inserted,RRR, PMI not displaced, Normal S1 and S2, soft 2/6 systolic murmur at the left sternal border,no gallops, bruit, thrill, or heave.  Abdomen: BS normal. Soft without organomegaly, masses, lesions or tenderness.  Extremities: without cyanosis, clubbing or edema. Good distal pulses bilateral  SKin: Warm, no lesions or rashes   Musculoskeletal: No deformities  Neuro: no focal signs   Wt Readings from Last 3 Encounters:  02/05/14 170 lb 6.4 oz (77.293 kg)  02/02/14 169 lb (76.658 kg)  01/20/14 170 lb 1.6 oz (77.157 kg)    Lab Results  Component Value Date   WBC 4.9 01/22/2014   HGB 13.2 01/22/2014   HCT 39.9 01/22/2014   PLT 156 01/22/2014   GLUCOSE 95 01/21/2014   CHOL 158 01/05/2014   TRIG 92 01/05/2014   HDL 49 01/05/2014   LDLCALC 91 01/05/2014   ALT 23 12/29/2012   AST 23 12/29/2012   NA 144 01/21/2014   K 4.1 01/21/2014   CL 106 01/21/2014   CREATININE 0.64 01/21/2014   BUN 16 01/21/2014   CO2 28 01/21/2014   INR 1.02 01/04/2014   HGBA1C 6.0* 01/05/2014    EKG:  2-D echo 01/21/14 Study Conclusions  - Left ventricle: The cavity size was normal. Systolic function was   normal.  Wall motion was normal; there were no regional wall   motion abnormalities. - Aortic valve: There was mild regurgitation. - Atrial septum: No defect or patent foramen ovale was identified.  Lexi scan Myoview 01/22/14 IMPRESSION: 1. No reversible ischemia or infarction.   2. Normal left ventricular wall motion.   3. Left ventricular ejection fraction 74%   4. Low-risk stress test findings*.

## 2014-02-05 NOTE — Assessment & Plan Note (Signed)
Patient is currently on Plavix and aspirin. Followup with neurology. No evidence of atrial fibrillation on loop recorder.

## 2014-02-05 NOTE — Assessment & Plan Note (Signed)
Patient had recent MI with low risk Lexi scan Myoview on 01/22/14. Patient is having exertional angina relieved with rest. Will increase them door to 60 mg daily. Given a prescription for nitroglycerin. Discussed with her how to take. Followup with Dr. Burt Knack in January.

## 2014-02-05 NOTE — Assessment & Plan Note (Signed)
Blood pressure controlled. 

## 2014-02-06 LAB — MDC_IDC_ENUM_SESS_TYPE_REMOTE
MDC IDC SESS DTM: 20151028204009
MDC IDC SET ZONE DETECTION INTERVAL: 2000 ms
MDC IDC SET ZONE DETECTION INTERVAL: 420 ms
Zone Setting Detection Interval: 3000 ms

## 2014-02-06 NOTE — Telephone Encounter (Signed)
Pt scheduled to see Dr Cooper on 02/13/14.  

## 2014-02-07 NOTE — Progress Notes (Signed)
Loop recorder 

## 2014-02-08 ENCOUNTER — Telehealth: Payer: Self-pay | Admitting: Internal Medicine

## 2014-02-08 ENCOUNTER — Ambulatory Visit: Payer: Medicare Other | Admitting: Neurology

## 2014-02-08 NOTE — Telephone Encounter (Signed)
Discussed with patient that per Dr. Nichola Sizer last office note she is to take the Zantac daily and Benefiber daily also.

## 2014-02-13 ENCOUNTER — Encounter: Payer: Self-pay | Admitting: Cardiovascular Disease

## 2014-02-13 ENCOUNTER — Telehealth: Payer: Self-pay | Admitting: Internal Medicine

## 2014-02-13 ENCOUNTER — Ambulatory Visit (INDEPENDENT_AMBULATORY_CARE_PROVIDER_SITE_OTHER): Payer: Medicare Other | Admitting: Cardiovascular Disease

## 2014-02-13 VITALS — BP 120/68 | HR 74 | Ht 62.5 in | Wt 170.1 lb

## 2014-02-13 DIAGNOSIS — I214 Non-ST elevation (NSTEMI) myocardial infarction: Secondary | ICD-10-CM

## 2014-02-13 DIAGNOSIS — K5732 Diverticulitis of large intestine without perforation or abscess without bleeding: Secondary | ICD-10-CM

## 2014-02-13 DIAGNOSIS — R079 Chest pain, unspecified: Secondary | ICD-10-CM

## 2014-02-13 MED ORDER — METRONIDAZOLE 500 MG PO TABS: 500.0000 mg | ORAL_TABLET | Freq: Three times a day (TID) | ORAL | Status: DC

## 2014-02-13 MED ORDER — ISOSORBIDE MONONITRATE ER 30 MG PO TB24: 30.0000 mg | ORAL_TABLET | Freq: Every day | ORAL | Status: DC

## 2014-02-13 NOTE — Telephone Encounter (Signed)
Having LLQ pain and mucous with streaks of blood again - she says like what she had with diverticulitis befiore that resolved with metronidazole after she had nausea and vomiting with Augmentin  I explained might not be diverticulitis but agreed to retreat with metronidazole for now and that we would contact her from office tomorrow to recheck and see if she will need f/u Dr. Olevia Perches

## 2014-02-13 NOTE — Progress Notes (Signed)
Background:  The patient is followed for presumed coronary artery disease , cerebrovascular disease, and palpitations. She's had a non-ST elevation MI treated conservatively. A Lexiscan Myoview stress study was low risk with an ejection fraction of 74% and no major ischemia identified. When she presented with her stroke earlier this year, a loop recorder was implanted. Other medical problems include essential hypertension and SVT. She's had normal systolic function based on echocardiography.  HPI:   80 year old woman presenting for follow-up evaluation.  When she was seen last in November 2015 , she was experiencing exertional angina and her isosorbide was increased at that time. The patient continues to have some episodes of chest and epigastric discomfort. Symptoms are improved from previous. She does experience chest tightness with walking, but not always correlating with physical exertion. She denies shortness of breath. Sometimes she has discomfort after eating and has a difficult time determining whether her symptoms are heart or GI related. Her main complaint over recent weeks his dizziness and headache. She has a dull ache in her head. She brings in home blood pressure readings and has primarily systolic hypertension with blood pressures in the 140 to 160s over 60s.  Studies:  Myoview 01/22/2014: IMPRESSION: 1. No reversible ischemia or infarction.  2. Normal left ventricular wall motion.  3. Left ventricular ejection fraction 74%  4. Low-risk stress test findings*.  Outpatient Encounter Prescriptions as of 02/13/2014  Medication Sig  . aspirin EC 81 MG EC tablet Take 1 tablet (81 mg total) by mouth daily.  . clopidogrel (PLAVIX) 75 MG tablet Take 1 tablet (75 mg total) by mouth daily.  . isosorbide mononitrate (IMDUR) 60 MG 24 hr tablet Take 1 tablet (60 mg total) by mouth daily.  . Melatonin 1 MG SUBL Place 1 mg under the tongue at bedtime.  . Multiple Vitamin (MULTI VITAMIN  DAILY PO) Take 1 tablet by mouth every morning.  . nebivolol (BYSTOLIC) 5 MG tablet Take 1 tablet (5 mg total) by mouth 2 (two) times daily.  . nitroGLYCERIN (NITROSTAT) 0.4 MG SL tablet Place 1 tablet (0.4 mg total) under the tongue every 5 (five) minutes as needed for chest pain.  Vladimir Faster Glycol-Propyl Glycol (SYSTANE OP) Place 1 drop into both eyes as needed (for itching eyes).  . prednisoLONE acetate (PRED FORTE) 1 % ophthalmic suspension Place 1 drop into both eyes 2 (two) times daily.   . [DISCONTINUED] amLODipine (NORVASC) 2.5 MG tablet Take 2.5 mg by mouth daily.  . [DISCONTINUED] amoxicillin-clavulanate (AUGMENTIN) 875-125 MG per tablet   . [DISCONTINUED] fluconazole (DIFLUCAN) 150 MG tablet   . [DISCONTINUED] isosorbide mononitrate (IMDUR) 30 MG 24 hr tablet Take 30 mg by mouth daily.  . [DISCONTINUED] metroNIDAZOLE (FLAGYL) 500 MG tablet Take 500 mg by mouth 2 (two) times daily. for 10 days  . [DISCONTINUED] oxyCODONE-acetaminophen (PERCOCET/ROXICET) 5-325 MG per tablet   . [DISCONTINUED] promethazine (PHENERGAN) 12.5 MG tablet   . [DISCONTINUED] RA ASPIRIN 325 MG tablet Take 325 mg by mouth daily.  . [DISCONTINUED] ranitidine (ZANTAC) 150 MG tablet Take 1 tablet (150 mg total) by mouth at bedtime. (Patient not taking: Reported on 02/13/2014)  . [DISCONTINUED] simvastatin (ZOCOR) 20 MG tablet     Allergies  Allergen Reactions  . Ultram [Tramadol] Hives  . Buprenex [Buprenorphine Hcl] Other (See Comments)    Heart raced  . Other Swelling    Yellow eye dye that is used to check retina. Caused swelling of tongue.  . Augmentin [Amoxicillin-Pot Clavulanate] Nausea And Vomiting  .  Levofloxacin Other (See Comments)    levaquin unknown reaction  . Percocet [Oxycodone-Acetaminophen] Nausea And Vomiting and Other (See Comments)    "felt drained"  . Sulfa Antibiotics Other (See Comments)    unknown  . Tape Other (See Comments)    Adhesive tape causes skin to pull off  . Zetia  [Ezetimibe] Other (See Comments)    unknown    Past Medical History  Diagnosis Date  . Arthritis   . Blind left eye     Corneal transplant x3 with rejection  . Dysrhythmia   . Varicose veins     both legs and left fooot at arch  . Complication of anesthesia     sensitive to meds, bp can fall  . SVT (supraventricular tachycardia) 12/15/2011    seen by Dr. Caryl Comes  . Helicobacter pylori (H. pylori) 05/22/02    RUT-Positive  . Gastritis   . Diverticulosis   . Stroke     family history includes Cancer in her brother; Heart attack in her brother and father; Stroke in her mother.   ROS: positive for vision loss, 'shakiness', abdominal pain, fatigue. Otherwise negative except as per HPI  BP 120/68 mmHg  Pulse 74  Ht 5' 2.5" (1.588 m)  Wt 170 lb 1.9 oz (77.166 kg)  BMI 30.60 kg/m2  PHYSICAL EXAM: Pt is alert and oriented, pleasant elderly woman in NAD HEENT: normal Neck: JVP - normal, carotids 2+= without bruits Lungs: CTA bilaterally CV: RRR without murmur or gallop Abd: soft, NT, Positive BS, no hepatomegaly Ext: no C/C/E, distal pulses intact and equal Skin: warm/dry no rash  ASSESSMENT AND PLAN: 1. Non-STEMI. The patient was treated conservatively and risk stratified with a nuclear scan which showed no evidence of ischemia. However, she continues to have chest discomfort with both typical and atypical features. We had a lengthy discussion today. The patient is here with her daughter and I spoke with another daughter on the telephone. Options include continued medical therapy versus consideration of cardiac catheterization and possible PCI. For now we are to continue with medical therapy but if continued symptoms I think it would be reasonable to pursue cardiac catheterization. It may be helpful to determine treatment options and even determine whether her symptoms are related to her heart. It is concerning that she had a non-ST elevation infarction associated with both chest pain  and cardiac enzyme elevation. I recommended that we reduce her Imdur to 30 mg as this is probably the cause of her headache and dizziness. She is scheduled for follow-up in January but was advised to call back if cardiac symptoms progress before that.  2. Stroke. An implantable loop recorder has been placed to evaluate for atrial fibrillation. For now she will continue on aspirin and Plavix.  3. Essential hypertension. Continue bystolic and Imdur  4. Lipids: The patient's lipid panel is normal with a cholesterol of 158 and LDL cholesterol 91, HDL 49. She tried simvastatin but was unable to tolerate it because of multiple side effects. She feels much better off of this medicine. I am not going to rechallenge her with a statin drug  For follow-up, she will be seen back as scheduled in January.  Time spent with patient and family > 35 minutes in direct discussion as outlined above.   [name], MD 02/13/2014 4:46 PM

## 2014-02-13 NOTE — Patient Instructions (Signed)
Your physician has recommended you make the following change in your medication:  DECREASE Isosorbide MN to 30mg  take one by mouth daily  Your physician recommends that you keep your scheduled follow-up appointment in January with Dr Burt Knack.

## 2014-02-14 ENCOUNTER — Encounter: Payer: Self-pay | Admitting: *Deleted

## 2014-02-14 ENCOUNTER — Ambulatory Visit: Payer: Medicare Other | Admitting: Physician Assistant

## 2014-02-14 ENCOUNTER — Telehealth: Payer: Self-pay | Admitting: *Deleted

## 2014-02-14 ENCOUNTER — Encounter: Payer: Self-pay | Admitting: Cardiovascular Disease

## 2014-02-14 ENCOUNTER — Other Ambulatory Visit: Payer: Self-pay | Admitting: Cardiovascular Disease

## 2014-02-14 NOTE — Telephone Encounter (Signed)
Yes, please, set up an OV for January 2016

## 2014-02-14 NOTE — Telephone Encounter (Signed)
Called patient to f/u on her today. She called Dr. Carlean Purl last night and was given Flagyl for ?diverticulitis. She is much better today. She is taking her medications. She does not have any f/u appointments. This is second time with diverticulitis recently. Does she need f/u OV?

## 2014-02-14 NOTE — Telephone Encounter (Signed)
Left a message for patient to call back. 

## 2014-02-14 NOTE — Telephone Encounter (Signed)
Spoke with patient and she is feeling much better today. She is taking her medication.

## 2014-02-14 NOTE — Telephone Encounter (Signed)
Scheduled OV on 04/03/14 at 10:15 AM. Letter mailed.

## 2014-02-19 ENCOUNTER — Telehealth: Payer: Self-pay | Admitting: Neurology

## 2014-02-19 ENCOUNTER — Encounter: Payer: Self-pay | Admitting: Neurology

## 2014-02-19 ENCOUNTER — Ambulatory Visit (INDEPENDENT_AMBULATORY_CARE_PROVIDER_SITE_OTHER): Payer: Medicare Other | Admitting: Neurology

## 2014-02-19 VITALS — BP 154/75 | HR 65 | Ht 62.0 in | Wt 170.4 lb

## 2014-02-19 DIAGNOSIS — I639 Cerebral infarction, unspecified: Secondary | ICD-10-CM

## 2014-02-19 NOTE — Telephone Encounter (Signed)
Spoke to patient and she is confused about what to do for low blood sugar.  She wasn't sure if doctor said to eat more carbohydrates?

## 2014-02-19 NOTE — Patient Instructions (Signed)
Stroke Prevention Some medical conditions and behaviors are associated with an increased chance of having a stroke. You may prevent a stroke by making healthy choices and managing medical conditions. HOW CAN I REDUCE MY RISK OF HAVING A STROKE?   Stay physically active. Get at least 30 minutes of activity on most or all days.  Do not smoke. It may also be helpful to avoid exposure to secondhand smoke.  Limit alcohol use. Moderate alcohol use is considered to be:  No more than 2 drinks per day for men.  No more than 1 drink per day for nonpregnant women.  Eat healthy foods. This involves:  Eating 5 or more servings of fruits and vegetables a day.  Making dietary changes that address high blood pressure (hypertension), high cholesterol, diabetes, or obesity.  Manage your cholesterol levels.  Making food choices that are high in fiber and low in saturated fat, trans fat, and cholesterol may control cholesterol levels.  Take any prescribed medicines to control cholesterol as directed by your health care provider.  Manage your diabetes.  Controlling your carbohydrate and sugar intake is recommended to manage diabetes.  Take any prescribed medicines to control diabetes as directed by your health care provider.  Control your hypertension.  Making food choices that are low in salt (sodium), saturated fat, trans fat, and cholesterol is recommended to manage hypertension.  Take any prescribed medicines to control hypertension as directed by your health care provider.  Maintain a healthy weight.  Reducing calorie intake and making food choices that are low in sodium, saturated fat, trans fat, and cholesterol are recommended to manage weight.  Stop drug abuse.  Avoid taking birth control pills.  Talk to your health care provider about the risks of taking birth control pills if you are over 35 years old, smoke, get migraines, or have ever had a blood clot.  Get evaluated for sleep  disorders (sleep apnea).  Talk to your health care provider about getting a sleep evaluation if you snore a lot or have excessive sleepiness.  Take medicines only as directed by your health care provider.  For some people, aspirin or blood thinners (anticoagulants) are helpful in reducing the risk of forming abnormal blood clots that can lead to stroke. If you have the irregular heart rhythm of atrial fibrillation, you should be on a blood thinner unless there is a good reason you cannot take them.  Understand all your medicine instructions.  Make sure that other conditions (such as anemia or atherosclerosis) are addressed. SEEK IMMEDIATE MEDICAL CARE IF:   You have sudden weakness or numbness of the face, arm, or leg, especially on one side of the body.  Your face or eyelid droops to one side.  You have sudden confusion.  You have trouble speaking (aphasia) or understanding.  You have sudden trouble seeing in one or both eyes.  You have sudden trouble walking.  You have dizziness.  You have a loss of balance or coordination.  You have a sudden, severe headache with no known cause.  You have new chest pain or an irregular heartbeat. Any of these symptoms may represent a serious problem that is an emergency. Do not wait to see if the symptoms will go away. Get medical help at once. Call your local emergency services (911 in [place] Do not drive yourself to the hospital. Document Released: 04/16/2004 Document Revised: 07/24/2013 Document Reviewed: 09/09/2012 [employer] Patient Information 2015 [employer]. This information is not intended to replace advice given   to you by your health care provider. Make sure you discuss any questions you have with your health care provider.  

## 2014-02-19 NOTE — Progress Notes (Signed)
Reason for visit: Stroke  [name] [name] is a 80 y.o. female  History of present illness:  [name] is an 80 year old right-handed white female with a history of a recent admission to the hospital that occurred on 01/04/2014. The patient was eating at the time, she noted that her right hand became weak and clumsy. She then noted that she was drooling out of the right side of the mouth, and she developed slurred speech. The patient went to the hospital, she did not receive TPA secondary to minimal deficits. A stroke evaluation showed evidence of a left frontal cortical stroke. MRA of the head and carotid Doppler studies were relatively unremarkable, and a 2-D echocardiogram was unremarkable. The patient recovered well, without significant residual deficit from the stroke. Two weeks after that admission, she was readmitted with chest pain and left arm numbness and tingling. She was found to have a myocardial infarction. The patient did not undergo coronary artery catheterization, but a stress test was unremarkable. The patient has had a loop recorder placed, and the interrogation in the middle of November did not show atrial fibrillation. The patient is on aspirin and Plavix at this time. She comes to this office for an evaluation. She has been having episodes of dizziness and nausea 2-3 hours after eating for least a year. Hemoglobin A1c was 6.0.  Past Medical History  Diagnosis Date  . Arthritis   . Blind left eye     Corneal transplant x3 with rejection  . Dysrhythmia   . Varicose veins     both legs and left fooot at arch  . Complication of anesthesia     sensitive to meds, bp can fall  . SVT (supraventricular tachycardia) 12/15/2011    seen by Dr. Caryl Comes  . Helicobacter pylori (H. pylori) 05/22/02    RUT-Positive  . Gastritis   . Diverticulosis   . Stroke     Past Surgical History  Procedure Laterality Date  . Cholecystectomy    . Tonsillectomy    . Tonsillectomy    . Corneal  transplant  right 2009, left 2009    both eyes, 3 in left eye, 1 in right eye  . Cataract surgery  2005    both eyes  . Abdominal hysterectomy   sept 1985    ovaries removed  . Total knee arthroplasty Left 06/27/2012    Procedure: LEFT TOTAL KNEE ARTHROPLASTY;  Surgeon: Gearlean Alf, MD;  Location: [facility];  Service: Orthopedics;  Laterality: Left;  . Tubal ligation    . Total knee arthroplasty Right 12/12/2012    Procedure: RIGHT TOTAL KNEE ARTHROPLASTY;  Surgeon: Gearlean Alf, MD;  Location: [facility];  Service: Orthopedics;  Laterality: Right;    Family History  Problem Relation Age of Onset  . Stroke Mother   . Heart attack Father   . Heart attack Brother   . Cancer Brother   . Heart attack Brother   . Heart attack Brother   . Heart attack Brother   . Parkinsonism Brother     Social history:  reports that she has never smoked. She has never used smokeless tobacco. She reports that she does not drink alcohol or use illicit drugs.  Medications:  Current Outpatient Prescriptions on File Prior to Visit  Medication Sig Dispense Refill  . aspirin EC 81 MG EC tablet Take 1 tablet (81 mg total) by mouth daily.    . clopidogrel (PLAVIX) 75 MG tablet take 1 tablet by mouth  once daily 30 tablet 2  . isosorbide mononitrate (IMDUR) 30 MG 24 hr tablet Take 1 tablet (30 mg total) by mouth daily. 30 tablet 6  . Melatonin 1 MG SUBL Place 1 mg under the tongue at bedtime.    . metroNIDAZOLE (FLAGYL) 500 MG tablet Take 1 tablet (500 mg total) by mouth 3 (three) times daily. 30 tablet 0  . Multiple Vitamin (MULTI VITAMIN DAILY PO) Take 1 tablet by mouth every morning.    . nebivolol (BYSTOLIC) 5 MG tablet Take 1 tablet (5 mg total) by mouth 2 (two) times daily. 60 tablet 6  . nitroGLYCERIN (NITROSTAT) 0.4 MG SL tablet Place 1 tablet (0.4 mg total) under the tongue every 5 (five) minutes as needed for chest pain. 25 tablet 3  . prednisoLONE acetate (PRED FORTE) 1 % ophthalmic suspension Place 1  drop into both eyes 2 (two) times daily.      No current facility-administered medications on file prior to visit.      Allergies  Allergen Reactions  . Ultram [Tramadol] Hives  . Buprenex [Buprenorphine Hcl] Other (See Comments)    Heart raced  . Other Swelling    Yellow eye dye that is used to check retina. Caused swelling of tongue.  . Augmentin [Amoxicillin-Pot Clavulanate] Nausea And Vomiting  . Levofloxacin Other (See Comments)    levaquin unknown reaction  . Percocet [Oxycodone-Acetaminophen] Nausea And Vomiting and Other (See Comments)    "felt drained"  . Sulfa Antibiotics Other (See Comments)    unknown  . Tape Other (See Comments)    Adhesive tape causes skin to pull off  . Zetia [Ezetimibe] Other (See Comments)    unknown    ROS:  Out of a complete 14 system review of symptoms, the patient complains only of the following symptoms, and all other reviewed systems are negative.  Fatigue Blind in left eye Abdominal pain Frequency of urination Bruising easily Dizziness  Blood pressure 154/75, pulse 65, height 5\' 2"  (1.575 m), weight 170 lb 6.4 oz (77.293 kg).  Physical Exam  General: The patient is alert and cooperative at the time of the examination.  Eyes: Pupils are equal, round, and reactive to light. Discs are flat bilaterally. The left eye is blind.  Neck: The neck is supple, no carotid bruits are noted.  Respiratory: The respiratory examination is clear.  Cardiovascular: The cardiovascular examination reveals a regular rate and rhythm, no obvious murmurs or rubs are noted.  Skin: Extremities are without significant edema.  Neurologic Exam  Mental status: The patient is alert and oriented x 3 at the time of the examination. The patient has apparent normal recent and remote memory, with an apparently normal attention span and concentration ability.  Cranial nerves: Facial symmetry is present. There is good sensation of the face to pinprick and soft  touch bilaterally. The strength of the facial muscles and the muscles to head turning and shoulder shrug are normal bilaterally. Speech is well enunciated, no aphasia or dysarthria is noted. Extraocular movements are full. Visual fields are full. The tongue is midline, and the patient has symmetric elevation of the soft palate. No obvious hearing deficits are noted.  Motor: The motor testing reveals 5 over 5 strength of all 4 extremities. Good symmetric motor tone is noted throughout.  Sensory: Sensory testing is intact to pinprick, soft touch, vibration sensation, and position sense on all 4 extremities. No evidence of extinction is noted.  Coordination: Cerebellar testing reveals good finger-nose-finger and heel-to-shin bilaterally.  Gait and station: Gait is normal. Tandem gait is unsteady. Romberg is negative. No drift is seen.  Reflexes: Deep tendon reflexes are symmetric and normal bilaterally. Toes are downgoing bilaterally.   MRI brain 01/04/14:  IMPRESSION: MRI HEAD IMPRESSION:  1. Acute ischemic infarct involving the cortical gray matter of the high left frontal lobe as above. No associated mass effect or hemorrhage. 2. Atrophy with moderate chronic microvascular ischemic disease.  MRA HEAD IMPRESSION:  1. No proximal branch occlusion identified within the intracranial circulation. 2. Single moderate short-segment stenosis within the proximal right posterior cerebral artery. No other hemodynamically significant stenosis identified within the intracranial circulation.    2D echo 01/22/14:  Study Conclusions  - Left ventricle: The cavity size was normal. Systolic function was normal. Wall motion was normal; there were no regional wall motion abnormalities. - Aortic valve: There was mild regurgitation. - Atrial septum: No defect or patent foramen ovale was identified.   Carotid doppler 01/05/14:  Summary: Findings suggest 1-39% internal carotid artery  stenosis bilaterally. Vertebral arteries are patent with antegrade flow.    Assessment/Plan:  1. Left frontal stroke  2. Myocardial infarction  The patient likely had an embolic stroke with a small cortical frontal stroke. The patient has a loop recorder in place. The patient had a small myocardial infarction 2 weeks following the stroke event. The patient will continue the aspirin and Plavix for now, and she will follow-up in about 4 months. If atrial fibrillation is determined on the loop recorder, anticoagulation may be required.  Jill Alexanders MD 02/19/2014 7:01 PM  [facility] [address] [place], [place] [address]  Phone [phone] Fax [phone]

## 2014-02-19 NOTE — Telephone Encounter (Signed)
I called the patient. The patient is to go on a low carbohydrate diet to see if this helps the episodes 2 or 3 hours after eating where she feels dizzy and nauseated.

## 2014-02-19 NOTE — Telephone Encounter (Signed)
Patient questioning if Dr. Jannifer Franklin instructed her to eat more Carbohydrates for low blood sugar.  Please call and advise.

## 2014-02-23 ENCOUNTER — Encounter: Payer: Self-pay | Admitting: Internal Medicine

## 2014-03-01 ENCOUNTER — Encounter (HOSPITAL_COMMUNITY): Payer: Self-pay | Admitting: Internal Medicine

## 2014-03-05 ENCOUNTER — Telehealth: Payer: Self-pay | Admitting: Cardiovascular Disease

## 2014-03-05 NOTE — Telephone Encounter (Signed)
New message      Pt has dry eyes and dry mouth.  Could this be from her medication?

## 2014-03-05 NOTE — Telephone Encounter (Signed)
Pt and a family member called to find out what medication that pt is taking will cause her to have a very extreme dry mouth and dry eyes. Pt went to the EYE doctor last week for that . The ophthalmologist increased her prednisone medication. But she continue having dry eyes and mouth. The eye doctor said that possible a medication that pt is taking could be causing the dry eyes. Pt said that she has not started a  new medication and does not know what medication is causing the dryness. Pt's daughter states that pt already has lost the sight of one eye, and they are trying to save the other eye sight. She wants for Dr. Burt Knack to change the medication that could be  causing her eye dryness  to some other medication.

## 2014-03-06 ENCOUNTER — Ambulatory Visit (INDEPENDENT_AMBULATORY_CARE_PROVIDER_SITE_OTHER): Payer: Medicare Other | Admitting: *Deleted

## 2014-03-06 DIAGNOSIS — I639 Cerebral infarction, unspecified: Secondary | ICD-10-CM

## 2014-03-06 NOTE — Telephone Encounter (Signed)
I reviewed her cardiac medications and I do not see any medications that cause dry eyes.

## 2014-03-06 NOTE — Telephone Encounter (Signed)
Left message on machine for pt to contact the office.   

## 2014-03-08 ENCOUNTER — Encounter (HOSPITAL_COMMUNITY): Payer: Self-pay | Admitting: Cardiovascular Disease

## 2014-03-08 NOTE — Progress Notes (Signed)
Loop recorder 

## 2014-03-08 NOTE — Telephone Encounter (Signed)
Pt is aware that Dr. Burt Knack  review of her cardiac medications, and he does not see any medication that cause dry eyes. Pt verbalized understanding.

## 2014-03-29 LAB — MDC_IDC_ENUM_SESS_TYPE_REMOTE

## 2014-04-03 ENCOUNTER — Ambulatory Visit: Payer: Medicare Other | Admitting: Cardiovascular Disease

## 2014-04-03 ENCOUNTER — Encounter: Payer: Self-pay | Admitting: Internal Medicine

## 2014-04-03 ENCOUNTER — Ambulatory Visit (INDEPENDENT_AMBULATORY_CARE_PROVIDER_SITE_OTHER): Payer: Medicare Other | Admitting: Internal Medicine

## 2014-04-03 VITALS — BP 148/64 | HR 76 | Ht 62.0 in | Wt 172.0 lb

## 2014-04-03 DIAGNOSIS — R1032 Left lower quadrant pain: Secondary | ICD-10-CM

## 2014-04-03 DIAGNOSIS — K5732 Diverticulitis of large intestine without perforation or abscess without bleeding: Secondary | ICD-10-CM

## 2014-04-03 MED ORDER — HYOSCYAMINE SULFATE 0.125 MG SL SUBL: 0.1250 mg | SUBLINGUAL_TABLET | SUBLINGUAL | Status: DC | PRN

## 2014-04-03 NOTE — Progress Notes (Signed)
[name] [name] [birth year] [id number]  Note: This dictation was prepared with Dragon digital system. Any transcriptional errors that result from this procedure are unintentional.   History of Present Illness: This is a 81 year old white female who developed acute diverticulitis in October and November 2015 and was seen by [name] [name] and treated with Cipro and Flagyl. CT scan of the abdomen showed the changes in the sigmoid colon with possible small perforation.. She responded to bowel rest and antibiotics and is much better. Over past 2 or 3 days she was having again mild discomfort in the left lower quadrant. Her bowel movements occur once or twice a day. Last colonoscopy was in 2004. She is not a candidate for recall colonoscopy because of her age and history of stroke.    Past Medical History  Diagnosis Date  . Arthritis   . Blind left eye     Corneal transplant x3 with rejection  . Dysrhythmia   . Varicose veins     both legs and left fooot at arch  . Complication of anesthesia     sensitive to meds, bp can fall  . SVT (supraventricular tachycardia) 12/15/2011    seen by Dr. Caryl [name]  . Helicobacter pylori (H. pylori) 05/22/02    RUT-Positive  . Gastritis   . Diverticulosis   . Stroke     Past Surgical History  Procedure Laterality Date  . Cholecystectomy    . Tonsillectomy    . Tonsillectomy    . Corneal transplant  right 2009, left 2009    both eyes, 3 in left eye, 1 in right eye  . Cataract surgery  2005    both eyes  . Abdominal hysterectomy   sept 1985    ovaries removed  . Total knee arthroplasty Left 06/27/2012    Procedure: LEFT TOTAL KNEE ARTHROPLASTY;  Surgeon: [name] Alf, MD;  Location: [facility];  Service: Orthopedics;  Laterality: Left;  . Tubal ligation    . Total knee arthroplasty Right 12/12/2012    Procedure: RIGHT TOTAL KNEE ARTHROPLASTY;  Surgeon: [name] Alf, MD;  Location: [facility];  Service: Orthopedics;  Laterality: Right;  . Loop recorder implant  N/A 01/05/2014    Procedure: LOOP RECORDER IMPLANT;  Surgeon: [name] Mark, MD;  Location: [place] [facility];  Service: Cardiovascular;  Laterality: N/A;    Allergies  Allergen Reactions  . Ultram [Tramadol] Hives  . Buprenex [Buprenorphine Hcl] Other (See Comments)    Heart raced  . Other Swelling    Yellow eye dye that is used to check retina. Caused swelling of tongue.  . Augmentin [Amoxicillin-Pot Clavulanate] Nausea And Vomiting  . Levofloxacin Other (See Comments)    levaquin unknown reaction  . Percocet [Oxycodone-Acetaminophen] Nausea And Vomiting and Other (See Comments)    "felt drained"  . Sulfa Antibiotics Other (See Comments)    unknown  . Tape Other (See Comments)    Adhesive tape causes skin to pull off  . Zetia [Ezetimibe] Other (See Comments)    unknown    Family history and social history have been reviewed.  Review of Systems: Normal bowel habits. She denies weight loss or rectal bleeding.  The remainder of the 10 point ROS is negative except as outlined in the H&P  Physical Exam: General Appearance Well developed, in no distress Eyes  Non icteric  HEENT  Non traumatic, normocephalic  Mouth No lesion, tongue papillated, no cheilosis Neck Supple without adenopathy, thyroid not enlarged, no carotid bruits, no JVD Lungs  Clear to auscultation bilaterally COR Normal S1, normal S2, regular rhythm, no murmur, quiet precordium Abdomen soft tender in left lower quadrant without rebound or fullness. Rest of the abdomen is unremarkable. Liver edge at costal margin Rectal not done Extremities  No pedal edema Skin No lesions Neurological Alert and oriented x 3 Psychological Normal mood and affect  Assessment and Plan:   81 year old white female with the first episode of diverticulitis , she has  responded to antibiotics. She  Has been  about 75% improved. We have discussed  low fiber diet slowly build up to high fiber. She was unable to tolerate Benefiber. We will  give her Levsin sublingually 0.125 mg to take when necessary prn  crampy abdominal pain. See her in 6 months. She is not a surgical candidate because of her age    [name] [name] 04/03/2014

## 2014-04-03 NOTE — Patient Instructions (Addendum)
We have sent in your prescription to your pharmacy Follow up in 4 months Dr Darcus Austin

## 2014-04-04 ENCOUNTER — Telehealth: Payer: Self-pay | Admitting: Internal Medicine

## 2014-04-04 NOTE — Telephone Encounter (Signed)
Spoke with patient and answered questions re: her medications. She states the Levsin did help her abdominal pain but she "did not want to use them up." Discussed that she can take one every 4 hours and she has a refill.

## 2014-04-04 NOTE — Telephone Encounter (Signed)
Patient seen by Dr. Olevia Perches today (reviewed). Daughter calls tonight reporting lower abdominal pain and diarrhea. Wants to start flagyl (has at home) and advise for pain. She does not feel her mom needs to go to ER. Told her to use levsin and tylenol for pain. OK to start flagyl (only has 3 days supply) but told to call office in am for further advise. She agreed

## 2014-04-05 ENCOUNTER — Telehealth: Payer: Self-pay | Admitting: Internal Medicine

## 2014-04-05 ENCOUNTER — Other Ambulatory Visit: Payer: Self-pay | Admitting: *Deleted

## 2014-04-05 ENCOUNTER — Ambulatory Visit (INDEPENDENT_AMBULATORY_CARE_PROVIDER_SITE_OTHER): Payer: Medicare Other | Admitting: *Deleted

## 2014-04-05 DIAGNOSIS — I639 Cerebral infarction, unspecified: Secondary | ICD-10-CM

## 2014-04-05 MED ORDER — METRONIDAZOLE 250 MG PO TABS: 250.0000 mg | ORAL_TABLET | Freq: Three times a day (TID) | ORAL | Status: DC

## 2014-04-05 NOTE — Telephone Encounter (Signed)
Rx for Flagyl sent.Spoke with patient and she reports she had a place on her left eye. Her eye doctor gave her Cephalexin(Keflex) 500 mg BID that she started on Monday. She states her side started hurting when she took this. She thinks the Keflex she took caused her abdominal pain. Please, advise.

## 2014-04-05 NOTE — Telephone Encounter (Signed)
Reviewed and agree. May need to add  Cipro or Augmentin if no response in next few days.

## 2014-04-05 NOTE — Telephone Encounter (Signed)
Started Flagyl last night by Dr. Henrene Pastor. She only had enough pills for a couple days. Will need more Flagyl if Dr. Olevia Perches wants to continue.

## 2014-04-05 NOTE — Telephone Encounter (Signed)
Keflex did not cause her abdominal pain but it seems that Keflex is not helping her diverticulitis. The onle antibiotic left to take is Doxycycline 100mg , #10, 1 po qd

## 2014-04-05 NOTE — Telephone Encounter (Signed)
Please refill Flagyl 250 mg, #30, and add Augmentin 875 mg, # 10 1 po qd, no refill

## 2014-04-05 NOTE — Telephone Encounter (Signed)
Patient has allergy listed to Augmentin. Is Flagyl TID x 10 days?

## 2014-04-05 NOTE — Telephone Encounter (Signed)
Flagyl is 250 mg po tid, #30. She is not allergic to PCN so we can add Keflex 250 mg, #40, 1 po qid.

## 2014-04-06 ENCOUNTER — Telehealth: Payer: Self-pay | Admitting: Cardiovascular Disease

## 2014-04-06 MED ORDER — DOXYCYCLINE HYCLATE 100 MG PO CAPS: ORAL_CAPSULE | ORAL | Status: DC

## 2014-04-06 NOTE — Progress Notes (Signed)
Loop recorder 

## 2014-04-06 NOTE — Telephone Encounter (Signed)
Called patient's daughter, [name], back. Consulted Dr. Burt Knack to see if patient's new medications would be okay to take with her heart medications. Informed her that Dr. Burt Knack advised that it is okay to take the Flagyl and Doxycycline at this time. Patient's daughter verbalized understanding.

## 2014-04-06 NOTE — Telephone Encounter (Signed)
New message      Will it be ok to take flagyl and doxcycline presc by Dr Olevia Perches.  Pt has diverticulitis.  Is it ok to take new meds with heart medication?

## 2014-04-06 NOTE — Telephone Encounter (Signed)
Patient notified Rx sent

## 2014-04-17 ENCOUNTER — Encounter: Payer: Self-pay | Admitting: Cardiovascular Disease

## 2014-04-17 ENCOUNTER — Encounter: Payer: Self-pay | Admitting: Internal Medicine

## 2014-04-17 ENCOUNTER — Ambulatory Visit (INDEPENDENT_AMBULATORY_CARE_PROVIDER_SITE_OTHER): Payer: Medicare Other | Admitting: Cardiovascular Disease

## 2014-04-17 VITALS — BP 114/60 | HR 71 | Ht 62.5 in | Wt 170.8 lb

## 2014-04-17 DIAGNOSIS — I252 Old myocardial infarction: Secondary | ICD-10-CM

## 2014-04-17 NOTE — Progress Notes (Signed)
Cardiology Office Note   Date:  04/17/2014   ID:  [name] [name], DOB 06-27-1924, MRN [id number]  PCP:  Marjorie Smolder, MD  Cardiologist:  [name], MD    No chief complaint on file.    History of Present Illness: [name] [name] is a 81 y.o. female who presents for follow-up evaluation.  She is followed for coronary artery disease , cerebrovascular disease, and palpitations. She's had a non-ST elevation MI treated conservatively. A Lexiscan Myoview stress study was low risk with an ejection fraction of 74% and no major ischemia identified. When she presented with her stroke earlier this year, a loop recorder was implanted. Other medical problems include essential hypertension and SVT. She's had normal systolic function based on echocardiography.  She's feeling better since her last appointment. Chest pain symptoms have resolved. No recent shortness of breath. She continues to exercise in the swimming pool, and does admit to dyspnea with physical exertion. No chest pain, chest pressure, or palpitations. No leg swelling.  She complains of dry mouth and dry eyes. She had one nose bleed that was fairly extensive but eventually stopped with pressure. No other new complaints today.  Past Medical History  Diagnosis Date  . Arthritis   . Blind left eye     Corneal transplant x3 with rejection  . Dysrhythmia   . Varicose veins     both legs and left fooot at arch  . Complication of anesthesia     sensitive to meds, bp can fall  . SVT (supraventricular tachycardia) 12/15/2011    seen by Dr. Caryl Comes  . Helicobacter pylori (H. pylori) 05/22/02    RUT-Positive  . Gastritis   . Diverticulosis   . Stroke     Past Surgical History  Procedure Laterality Date  . Cholecystectomy    . Tonsillectomy    . Tonsillectomy    . Corneal transplant  right 2009, left 2009    both eyes, 3 in left eye, 1 in right eye  . Cataract surgery  2005    both eyes  . Abdominal hysterectomy   sept 1985    ovaries removed  . Total knee arthroplasty Left 06/27/2012    Procedure: LEFT TOTAL KNEE ARTHROPLASTY;  Surgeon: Gearlean Alf, MD;  Location: [facility];  Service: Orthopedics;  Laterality: Left;  . Tubal ligation    . Total knee arthroplasty Right 12/12/2012    Procedure: RIGHT TOTAL KNEE ARTHROPLASTY;  Surgeon: Gearlean Alf, MD;  Location: [facility];  Service: Orthopedics;  Laterality: Right;  . Loop recorder implant N/A 01/05/2014    Procedure: LOOP RECORDER IMPLANT;  Surgeon: Coralyn Mark, MD;  Location: [facility];  Service: Cardiovascular;  Laterality: N/A;    Current Outpatient Prescriptions  Medication Sig Dispense Refill  . aspirin EC 81 MG EC tablet Take 1 tablet (81 mg total) by mouth daily.    . clopidogrel (PLAVIX) 75 MG tablet take 1 tablet by mouth once daily 30 tablet 2  . doxycycline (VIBRAMYCIN) 100 MG capsule Take one po daily 10 capsule 0  . hyoscyamine (LEVSIN SL) 0.125 MG SL tablet Place 1 tablet (0.125 mg total) under the tongue every 4 (four) hours as needed. 30 tablet 1  . isosorbide mononitrate (IMDUR) 30 MG 24 hr tablet Take 1 tablet (30 mg total) by mouth daily. 30 tablet 6  . Melatonin 1 MG SUBL Place 1 mg under the tongue at bedtime.    . Multiple Vitamin (MULTI VITAMIN DAILY PO) Take  1 tablet by mouth every morning.    . nebivolol (BYSTOLIC) 5 MG tablet Take 1 tablet (5 mg total) by mouth 2 (two) times daily. 60 tablet 6  . nitroGLYCERIN (NITROSTAT) 0.4 MG SL tablet Place 1 tablet (0.4 mg total) under the tongue every 5 (five) minutes as needed for chest pain. 25 tablet 3  . prednisoLONE acetate (PRED FORTE) 1 % ophthalmic suspension Place 1 drop into both eyes 2 (two) times daily.     . ranitidine (ZANTAC) 150 MG tablet Take 150 mg by mouth at bedtime.  0   No current facility-administered medications for this visit.    Allergies:   Ultram; Buprenex; Keflex; Other; Augmentin; Levofloxacin; Percocet; Sulfa antibiotics; Tape; and Zetia   Social History:  The  patient  reports that she has never smoked. She has never used smokeless tobacco. She reports that she does not drink alcohol or use illicit drugs.   Family History:  The patient's family history includes Cancer in her brother; Heart attack in her brother, brother, brother, brother, and father; Parkinsonism in her brother; Stroke in her mother.    ROS:  Please see the history of present illness.  Otherwise, review of systems is positive for exertional dyspnea, easy bruising, right leg pain, and diverticulitis.  All other systems are reviewed and negative.    PHYSICAL EXAM: VS:  BP 114/60 mmHg  Pulse 71  Ht 5' 2.5" (1.588 m)  Wt 170 lb 12.8 oz (77.474 kg)  BMI 30.72 kg/m2  SpO2 95% , BMI Body mass index is 30.72 kg/(m^2). GEN: Well nourished, well developed, pleasant elderly woman in no acute distress HEENT: normal Neck: no JVD, carotid bruits, or masses Cardiac: RRR; no murmurs, rubs, or gallops,no edema  Respiratory:  clear to auscultation bilaterally, normal work of breathing GI: soft, nontender, nondistended, + BS MS: no deformity or atrophy Skin: warm and dry, no rash Neuro:  Strength and sensation are intact Psych: euthymic mood, full affect  EKG:  EKG is not ordered today.  Recent Labs: 01/21/2014: BUN 16; Creatinine 0.64; Potassium 4.1; Sodium 144 01/22/2014: Hemoglobin 13.2; Platelets 156   Lipid Panel     Component Value Date/Time   CHOL 158 01/05/2014 0346   TRIG 92 01/05/2014 0346   HDL 49 01/05/2014 0346   CHOLHDL 3.2 01/05/2014 0346   VLDL 18 01/05/2014 0346   LDLCALC 91 01/05/2014 0346      Wt Readings from Last 3 Encounters:  04/17/14 170 lb 12.8 oz (77.474 kg)  04/03/14 172 lb (78.019 kg)  02/19/14 170 lb 6.4 oz (77.293 kg)     Other studies Reviewed: 2D Echo 01/21/2014: Study Conclusions  - Left ventricle: The cavity size was normal. Systolic function was normal. Wall motion was normal; there were no regional wall motion abnormalities. -  Aortic valve: There was mild regurgitation. - Atrial septum: No defect or patent foramen ovale was identified.  ASSESSMENT AND PLAN: 1.  Old MI - pt with NSTEMI now > 2 months ago and had a low-risk Myoview scan. Her chest pain has resolved. Will continue current Rx. If recurrent epistaxis would stop ASA and plan to continue plavix if possible.   2. Stroke - loop recorder with no evidence of atrial fibrillation. Continue plavix.   3. HTN - BP controlled. Current meds reviewed.  Current medicines are reviewed with the patient today.  The patient does not have concerns regarding medicines.  The following changes have been made:  no change  Labs/ tests ordered  today include:  No orders of the defined types were placed in this encounter.   Disposition:   FU with me in 3 months  Signed, [name], MD  04/17/2014 11:45 AM    [address], [place], [place]  [address] Phone: [phone]; Fax: [phone]

## 2014-04-17 NOTE — Patient Instructions (Signed)
Your physician recommends that you schedule a follow-up appointment in: 4 MONTHS with Dr Cooper  Your physician recommends that you continue on your current medications as directed. Please refer to the Current Medication list given to you today.  

## 2014-05-01 LAB — MDC_IDC_ENUM_SESS_TYPE_REMOTE

## 2014-05-03 ENCOUNTER — Ambulatory Visit: Payer: Medicare Other | Admitting: Cardiovascular Disease

## 2014-05-04 ENCOUNTER — Ambulatory Visit (INDEPENDENT_AMBULATORY_CARE_PROVIDER_SITE_OTHER): Payer: Medicare Other | Admitting: *Deleted

## 2014-05-04 DIAGNOSIS — I639 Cerebral infarction, unspecified: Secondary | ICD-10-CM

## 2014-05-09 NOTE — Progress Notes (Signed)
Loop recorder 

## 2014-05-12 ENCOUNTER — Other Ambulatory Visit: Payer: Self-pay | Admitting: Cardiovascular Disease

## 2014-05-16 LAB — MDC_IDC_ENUM_SESS_TYPE_REMOTE

## 2014-05-17 ENCOUNTER — Encounter: Payer: Self-pay | Admitting: Internal Medicine

## 2014-05-18 ENCOUNTER — Telehealth: Payer: Self-pay | Admitting: Cardiovascular Disease

## 2014-05-18 NOTE — Telephone Encounter (Signed)
I spoke with the pt and made her aware that I have completed and faxed paperwork in regards to prior authorization for Bystolic. I made her aware that she needs to continue this medication at this time and pay out of pocket for pills until the insurance company makes a determination.  Pt agreed with plan.

## 2014-05-18 NOTE — Telephone Encounter (Signed)
New message     Pt went to drug store to get bystolic 5mg .  Drug store said she needs prior approval before she can get a refill.  Pt has 2 pills left.  Please call pt and let her know if she is to continue taking rx

## 2014-05-22 NOTE — Telephone Encounter (Signed)
I spoke with the pharmacist and the pt's bystolic was approved by insurance. The pt's copay for medication has increased to $80.  I made the pt aware of this information and she said she would pick up her prescription at the pharmacy.

## 2014-05-25 ENCOUNTER — Encounter: Payer: Self-pay | Admitting: Internal Medicine

## 2014-05-30 ENCOUNTER — Telehealth: Payer: Self-pay | Admitting: Cardiovascular Disease

## 2014-05-30 MED ORDER — AMLODIPINE BESYLATE 2.5 MG PO TABS: 2.5000 mg | ORAL_TABLET | Freq: Every day | ORAL | Status: DC

## 2014-05-30 NOTE — Telephone Encounter (Signed)
New message      Pt c/o BP issue: STAT if pt c/o blurred vision, one-sided weakness or slurred speech  1. What are your last 5 BP readings? 229/???, 188/83, 185/73,147/73,166/73, 190/85  2. Are you having any other symptoms (ex. Dizziness, headache, blurred vision, passed out)? Pt said she felt her heartbeat all over her body, fatigued 3. What is your BP issue? bp is "all over the place"

## 2014-05-30 NOTE — Telephone Encounter (Signed)
I spoke with the pt's daughter and she is concerned about the pt's fluctuating BP. The pt's SBP on Friday night was 229. The pt forgot to take both her Bystolic doses on Saturday.     05/29/14 188/83 5:40 PM pt took 1 NTG (could feel heart pounding),             147/73 7:40 PM            190/85 8:30 PM 05/30/14 at 8:20 AM 709/64 prior to bystolic              NOW 172/79, pulse 67   In reviewing the pt's chart the pt is no longer on amlodipine because we had increased this in the past to 5mg  daily and her SBP dropped to 115-120 and the pt did not feel well with this BP reading (12/26/13 Pt email). At this time we will restart Amlodipine 2.5mg  daily and the pt will continue to monitor BP.  Pt's daughter agreed with plan and Rx sent to pharmacy.

## 2014-05-30 NOTE — Telephone Encounter (Signed)
Agree with plan thx!

## 2014-05-30 NOTE — Telephone Encounter (Signed)
Follow up  Pt daughter calling back to speak w/ Rn about pt's condition. Please call back and discuss.

## 2014-06-04 ENCOUNTER — Telehealth: Payer: Self-pay | Admitting: Cardiovascular Disease

## 2014-06-04 ENCOUNTER — Ambulatory Visit (INDEPENDENT_AMBULATORY_CARE_PROVIDER_SITE_OTHER): Payer: Medicare Other | Admitting: *Deleted

## 2014-06-04 DIAGNOSIS — I639 Cerebral infarction, unspecified: Secondary | ICD-10-CM

## 2014-06-04 NOTE — Telephone Encounter (Signed)
BP's in good range. Would continue same unless there is concern that she's having side effects.

## 2014-06-04 NOTE — Telephone Encounter (Signed)
F/U      Pt daughter is returning call about bp 108/56.    Should she take medication?    Please return call.

## 2014-06-04 NOTE — Telephone Encounter (Signed)
That sounds fine. Ok to hold if BP running that low before amlodipine given.

## 2014-06-04 NOTE — Telephone Encounter (Signed)
New message        Pt daughter calling to see if pt should take bp medication today even though bp has dropped.  Readings: 3/13 AM 120/55 3/13 PM 117/56 3/12 PM 119/61

## 2014-06-04 NOTE — Telephone Encounter (Signed)
Talked to patient's daughter [name]). Daughter is concerned about patient taking amlodipine 2.5 mg with her SBP being in the 110's to 120's. Patient's BP before taking medication today was 132/67. Daughter wants to know if it get too low, does she need to stop taking the amlodipine. Informed daughter that patient's BP looks good from the weekend, and usually this is a good target range, but daughter thinks it is still too low. Informed daughter that this message would be forward to Dr. Burt Knack, so he is aware and see if any changes need to be made. Daughter verbalized understanding and agreed to plan.  Reading: 3/12 PM 119/61 3/13 AM 120/55 3/13 PM 117/56 3/14 AM 132/67

## 2014-06-04 NOTE — Telephone Encounter (Signed)
Called daughter with Dr. Antionette Char note and daughter agreed to plan.

## 2014-06-04 NOTE — Telephone Encounter (Signed)
Called daughter back with Dr. Antionette Char note. Daughter wants Dr. Burt Knack to know that BP 108/56 is before taking amlodipine and so daughter did not give the amlodipine. Will forward to Dr. Burt Knack.

## 2014-06-07 ENCOUNTER — Telehealth: Payer: Self-pay | Admitting: Neurology

## 2014-06-07 ENCOUNTER — Telehealth: Payer: Self-pay | Admitting: Cardiovascular Disease

## 2014-06-07 NOTE — Progress Notes (Signed)
Loop recorder 

## 2014-06-07 NOTE — Telephone Encounter (Signed)
I spoke with the pt's daughter and she would like guidelines on when the pt should take Amlodipine.  The pt has not been taking the medication because her SBP has been running around 115 and if her BP is lower she feels "washed out".  The pt's BP continues to fluctuate and I advised [name] [name] that Amlodipine 2.5mg  can be taken once a day as needed for SBP greater than 150. [name] [name] also states the pt has complained of a headache and right foot falling asleep the past 2 days. I made [name] [name] aware that a pt can notice a headache with an elevated BP but the pt's BP has been under better control this week.  [name] [name] has touched base with neurology and I made her aware that this is appropriate considering the pt's prior history of stroke.

## 2014-06-07 NOTE — Telephone Encounter (Signed)
Patient stated she's experiencing R foot falling asleep during the night and having headaches from back to front for the last two nights.  Questioning if she needs to come in before 4/12.  Please call and advise.

## 2014-06-07 NOTE — Telephone Encounter (Signed)
Called patient. The patient has had headaches the last 2 nights while sleeping. The headaches are on the top of the head. Last night, she had a transient episode of numbness affecting the right leg lasting about 15 minutes, unassociated with weakness or balance issues. She has a loop recorder in place. I will have the patient come in next week for an evaluation. Headaches are unusual for her.

## 2014-06-07 NOTE — Telephone Encounter (Signed)
New msg       Pt has been having headaches in different parts of her head,   Right foot has been "sleep"  What should pt do about amlodopine?  Daughter states she was given instructions on this medication and needs clarification.  Please call.

## 2014-06-07 NOTE — Telephone Encounter (Signed)
Agree with plan 

## 2014-06-07 NOTE — Telephone Encounter (Signed)
Spoke to patient and she relayed that she woke with these headaches on the top of her head on right and going to the back of head with sharp pain.  This happened two nights in a row and they eventually go away.  She had forgot to take her BP medicine Bystolic one day and these symptoms occurred a day or two later.  She is also concerned with right foot falling asleep during the night and taking a while for it to subside.  Please call and advise.

## 2014-06-08 ENCOUNTER — Telehealth: Payer: Self-pay | Admitting: *Deleted

## 2014-06-08 MED ORDER — RANITIDINE HCL 150 MG PO TABS: 150.0000 mg | ORAL_TABLET | Freq: Every day | ORAL | Status: DC

## 2014-06-08 NOTE — Telephone Encounter (Signed)
Sent Rx for Ranitidine (ZANTAC), 150 mg, #30 with three refills to [facility] in [place], [place] on 06/08/14.

## 2014-06-08 NOTE — Telephone Encounter (Signed)
Spoke to patient and her daughter and scheduled appointment for 06-14-14 at 0800.

## 2014-06-09 ENCOUNTER — Telehealth: Payer: Self-pay | Admitting: Cardiology

## 2014-06-09 NOTE — Telephone Encounter (Signed)
    Patient called stating her BP  has been elevated for the last 2-3 days in the 492E systolic. She admits that she missed two doses of meds during this time period. No chest pain or dyspnea. Mild HA. Patient advised to be more compliant with meds. Also advised to refrain from sodium and caffieen. Mobeetie for her to increase amlodipine to 5 mg if still hypertensive. She is to monitor BP closely at home. If still hypertensive, she was notified to contact our office on Monday to arrange appointment.    Giovan Pinsky 06/09/2014

## 2014-06-14 ENCOUNTER — Ambulatory Visit (INDEPENDENT_AMBULATORY_CARE_PROVIDER_SITE_OTHER): Payer: Medicare Other | Admitting: Neurology

## 2014-06-14 ENCOUNTER — Encounter: Payer: Self-pay | Admitting: Neurology

## 2014-06-14 VITALS — BP 133/65 | HR 71 | Ht 62.0 in | Wt 171.2 lb

## 2014-06-14 DIAGNOSIS — G441 Vascular headache, not elsewhere classified: Secondary | ICD-10-CM

## 2014-06-14 DIAGNOSIS — Z8673 Personal history of transient ischemic attack (TIA), and cerebral infarction without residual deficits: Secondary | ICD-10-CM

## 2014-06-14 DIAGNOSIS — I471 Supraventricular tachycardia, unspecified: Secondary | ICD-10-CM

## 2014-06-14 DIAGNOSIS — R51 Headache: Secondary | ICD-10-CM

## 2014-06-14 DIAGNOSIS — R519 Headache, unspecified: Secondary | ICD-10-CM | POA: Insufficient documentation

## 2014-06-14 NOTE — Progress Notes (Signed)
Reason for visit: Headache  [name] [name] is an 81 y.o. female  History of present illness:  [name] [name] is an 81 year old right-handed white female with a history of hypertension, and supraventricular tachycardia. The patient has begun having episodes of severely increased heart rate in the middle of the night beginning about 2 weeks prior to this evaluation. The patient began developing nocturnal headaches last week. She has been diligent about checking her blood pressures, and she has noted that her blood pressures will commonly be in the 180 to 026 range systolic around the time of the headache. The patient has had an adjustment in her blood pressure medications, amlodipine was added at night, and increased to 5 mg. This has helped her blood pressures, and her headaches have disappeared. Unfortunately, she continues to have nightly episodes of increased heart rate, she is unable to sleep because of this. She has frequency of urination during the episodes. The patient denies any changes, new numbness or weakness of the face, arms, or legs. She is not had any change in balance. She comes to this office for an evaluation. The patient has a history of a prior left frontal stroke event. She has a loop recorder in place. So far, atrial fibrillation has not been noted. She is followed by Dr. Burt Knack from cardiology.  Past Medical History  Diagnosis Date  . Arthritis   . Blind left eye     Corneal transplant x3 with rejection  . Dysrhythmia   . Varicose veins     both legs and left fooot at arch  . Complication of anesthesia     sensitive to meds, bp can fall  . SVT (supraventricular tachycardia) 12/15/2011    seen by Dr. Caryl Comes  . Helicobacter pylori (H. pylori) 05/22/02    RUT-Positive  . Gastritis   . Diverticulosis   . Stroke   . Irregular heartbeat   . History of stroke 06/14/2014    Past Surgical History  Procedure Laterality Date  . Cholecystectomy    . Tonsillectomy    .  Tonsillectomy    . Corneal transplant  right 2009, left 2009    both eyes, 3 in left eye, 1 in right eye  . Cataract surgery  2005    both eyes  . Abdominal hysterectomy   sept 1985    ovaries removed  . Total knee arthroplasty Left 06/27/2012    Procedure: LEFT TOTAL KNEE ARTHROPLASTY;  Surgeon: Gearlean Alf, MD;  Location: [facility];  Service: Orthopedics;  Laterality: Left;  . Tubal ligation    . Total knee arthroplasty Right 12/12/2012    Procedure: RIGHT TOTAL KNEE ARTHROPLASTY;  Surgeon: Gearlean Alf, MD;  Location: [facility];  Service: Orthopedics;  Laterality: Right;  . Loop recorder implant N/A 01/05/2014    Procedure: LOOP RECORDER IMPLANT;  Surgeon: Coralyn Mark, MD;  Location: [facility];  Service: Cardiovascular;  Laterality: N/A;    Family History  Problem Relation Age of Onset  . Stroke Mother   . Heart attack Father   . Heart attack Brother   . Cancer Brother   . Heart attack Brother   . Heart attack Brother   . Heart attack Brother   . Parkinsonism Brother     Social history:  reports that she has never smoked. She has never used smokeless tobacco. She reports that she does not drink alcohol or use illicit drugs.    Allergies  Allergen Reactions  . Ultram [  Tramadol] Hives  . Buprenex [Buprenorphine Hcl] Other (See Comments)    Heart raced  . Keflex [Cephalexin] Diarrhea  . Other Swelling    Yellow eye dye that is used to check retina. Caused swelling of tongue.  . Augmentin [Amoxicillin-Pot Clavulanate] Nausea And Vomiting  . Levofloxacin Other (See Comments)    levaquin unknown reaction  . Percocet [Oxycodone-Acetaminophen] Nausea And Vomiting and Other (See Comments)    "felt drained"  . Sulfa Antibiotics Other (See Comments)    unknown  . Tape Other (See Comments)    Adhesive tape causes skin to pull off  . Zetia [Ezetimibe] Other (See Comments)    unknown    Medications:  Prior to Admission medications   Medication Sig Start Date End Date  Taking? Authorizing Provider  amLODipine (NORVASC) 2.5 MG tablet Take 1 tablet (2.5 mg total) by mouth daily. 05/30/14   [name], MD  aspirin EC 81 MG EC tablet Take 1 tablet (81 mg total) by mouth daily. 01/22/14   Geradine Girt, DO  clopidogrel (PLAVIX) 75 MG tablet take 1 tablet by mouth once daily 05/15/14   [name], MD  doxycycline (VIBRAMYCIN) 100 MG capsule Take one po daily 04/06/14   Lafayette Dragon, MD  hyoscyamine (LEVSIN SL) 0.125 MG SL tablet Place 1 tablet (0.125 mg total) under the tongue every 4 (four) hours as needed. 04/03/14   Lafayette Dragon, MD  isosorbide mononitrate (IMDUR) 30 MG 24 hr tablet Take 1 tablet (30 mg total) by mouth daily. 02/13/14   [name], MD  Melatonin 1 MG SUBL Place 1 mg under the tongue at bedtime.    Historical Provider, MD  Multiple Vitamin (MULTI VITAMIN DAILY PO) Take 1 tablet by mouth every morning.    Historical Provider, MD  nebivolol (BYSTOLIC) 5 MG tablet Take 1 tablet (5 mg total) by mouth 2 (two) times daily. 12/05/13   [name], MD  nitroGLYCERIN (NITROSTAT) 0.4 MG SL tablet Place 1 tablet (0.4 mg total) under the tongue every 5 (five) minutes as needed for chest pain. 02/05/14   Imogene Burn, PA-C  prednisoLONE acetate (PRED FORTE) 1 % ophthalmic suspension Place 1 drop into both eyes 2 (two) times daily.     Historical Provider, MD  ranitidine (ZANTAC) 150 MG tablet Take 1 tablet (150 mg total) by mouth at bedtime. 06/08/14   Lafayette Dragon, MD    ROS:  Out of a complete 14 system review of symptoms, the patient complains only of the following symptoms, and all other reviewed systems are negative.  Appetite change Palpitations of the heart, increased heart rate Frequent waking Frequency of urination  Blood pressure 133/65, pulse 71, height 5\' 2"  (1.575 m), weight 171 lb 3.2 oz (77.656 kg).  Physical Exam  General: The patient is alert and cooperative at the time of the examination.  Skin: No significant  peripheral edema is noted.   Neurologic Exam  Mental status: The patient is alert and oriented x 3 at the time of the examination. The patient has apparent normal recent and remote memory, with an apparently normal attention span and concentration ability.   Cranial nerves: Facial symmetry is present. Speech is normal, no aphasia or dysarthria is noted. Extraocular movements are full. Visual fields are full.  Motor: The patient has good strength in all 4 extremities.  Sensory examination: Soft touch sensation is symmetric on the face, arms, and legs.  Coordination: The patient has good finger-nose-finger and heel-to-shin bilaterally.  Gait and station: The patient has a normal gait. Tandem gait is normal. Romberg is negative. No drift is seen.  Reflexes: Deep tendon reflexes are symmetric.   MRI brain/MRA head 01/04/14:  IMPRESSION: MRI HEAD IMPRESSION:  1. Acute ischemic infarct involving the cortical gray matter of the high left frontal lobe as above. No associated mass effect or hemorrhage. 2. Atrophy with moderate chronic microvascular ischemic disease.  MRA HEAD IMPRESSION:  1. No proximal branch occlusion identified within the intracranial circulation. 2. Single moderate short-segment stenosis within the proximal right posterior cerebral artery. No other hemodynamically significant stenosis identified within the intracranial circulation.  * MRI scan images were reviewed online. I agree with the written report.   Assessment/Plan:  1. History of stroke, left frontal  2. Supraventricular tachycardia  3. Headache, new onset  The patient is having nightly episodes of increased heart rate, she has noted significant changes in her blood pressure, with elevations in blood pressure at night. This is the likely etiology of her headache. She has had improvement in her headache with the addition of amlodipine. The patient will be sent for a sedimentation rate today, if  this is unremarkable, we will follow the patient conservatively. If the headaches return with normal blood pressures, a CT or MRI of the brain will be done. The patient is to be seen and evaluated by cardiology, but her appointment is not until May 2016, and she is having daily events of significant tachycardia that may last several hours, keeping her from sleeping.  Jill Alexanders MD 06/14/2014 7:16 PM  [facility] [address] [place], [place] [address]  Phone [phone] Fax [phone]

## 2014-06-14 NOTE — Patient Instructions (Signed)
Stroke Prevention Some medical conditions and behaviors are associated with an increased chance of having a stroke. You may prevent a stroke by making healthy choices and managing medical conditions. HOW CAN I REDUCE MY RISK OF HAVING A STROKE?   Stay physically active. Get at least 30 minutes of activity on most or all days.  Do not smoke. It may also be helpful to avoid exposure to secondhand smoke.  Limit alcohol use. Moderate alcohol use is considered to be:  No more than 2 drinks per day for men.  No more than 1 drink per day for nonpregnant women.  Eat healthy foods. This involves:  Eating 5 or more servings of fruits and vegetables a day.  Making dietary changes that address high blood pressure (hypertension), high cholesterol, diabetes, or obesity.  Manage your cholesterol levels.  Making food choices that are high in fiber and low in saturated fat, trans fat, and cholesterol may control cholesterol levels.  Take any prescribed medicines to control cholesterol as directed by your health care provider.  Manage your diabetes.  Controlling your carbohydrate and sugar intake is recommended to manage diabetes.  Take any prescribed medicines to control diabetes as directed by your health care provider.  Control your hypertension.  Making food choices that are low in salt (sodium), saturated fat, trans fat, and cholesterol is recommended to manage hypertension.  Take any prescribed medicines to control hypertension as directed by your health care provider.  Maintain a healthy weight.  Reducing calorie intake and making food choices that are low in sodium, saturated fat, trans fat, and cholesterol are recommended to manage weight.  Stop drug abuse.  Avoid taking birth control pills.  Talk to your health care provider about the risks of taking birth control pills if you are over 35 years old, smoke, get migraines, or have ever had a blood clot.  Get evaluated for sleep  disorders (sleep apnea).  Talk to your health care provider about getting a sleep evaluation if you snore a lot or have excessive sleepiness.  Take medicines only as directed by your health care provider.  For some people, aspirin or blood thinners (anticoagulants) are helpful in reducing the risk of forming abnormal blood clots that can lead to stroke. If you have the irregular heart rhythm of atrial fibrillation, you should be on a blood thinner unless there is a good reason you cannot take them.  Understand all your medicine instructions.  Make sure that other conditions (such as anemia or atherosclerosis) are addressed. SEEK IMMEDIATE MEDICAL CARE IF:   You have sudden weakness or numbness of the face, arm, or leg, especially on one side of the body.  Your face or eyelid droops to one side.  You have sudden confusion.  You have trouble speaking (aphasia) or understanding.  You have sudden trouble seeing in one or both eyes.  You have sudden trouble walking.  You have dizziness.  You have a loss of balance or coordination.  You have a sudden, severe headache with no known cause.  You have new chest pain or an irregular heartbeat. Any of these symptoms may represent a serious problem that is an emergency. Do not wait to see if the symptoms will go away. Get medical help at once. Call your local emergency services (911 in [place] Do not drive yourself to the hospital. Document Released: 04/16/2004 Document Revised: 07/24/2013 Document Reviewed: 09/09/2012 [employer] Patient Information 2015 [employer]. This information is not intended to replace advice given   to you by your health care provider. Make sure you discuss any questions you have with your health care provider.  

## 2014-06-15 LAB — SEDIMENTATION RATE: SED RATE: 2 mm/h (ref 0–40)

## 2014-06-18 ENCOUNTER — Other Ambulatory Visit: Payer: Self-pay

## 2014-06-18 MED ORDER — AMLODIPINE BESYLATE 2.5 MG PO TABS: ORAL_TABLET | ORAL | Status: DC

## 2014-06-21 LAB — MDC_IDC_ENUM_SESS_TYPE_REMOTE
Date Time Interrogation Session: 20160319192354
Zone Setting Detection Interval: 2000 ms
Zone Setting Detection Interval: 3000 ms
Zone Setting Detection Interval: 420 ms

## 2014-06-22 ENCOUNTER — Emergency Department (HOSPITAL_COMMUNITY)
Admission: EM | Admit: 2014-06-22 | Discharge: 2014-06-22 | Disposition: A | Discharge status: Home / Self Care | Payer: Medicare Other | Attending: Emergency Medicine | Admitting: Emergency Medicine

## 2014-06-22 ENCOUNTER — Emergency Department (HOSPITAL_COMMUNITY): Payer: Medicare Other

## 2014-06-22 ENCOUNTER — Encounter (HOSPITAL_COMMUNITY): Payer: Self-pay | Admitting: Emergency Medicine

## 2014-06-22 DIAGNOSIS — Z8619 Personal history of other infectious and parasitic diseases: Secondary | ICD-10-CM | POA: Insufficient documentation

## 2014-06-22 DIAGNOSIS — Z7982 Long term (current) use of aspirin: Secondary | ICD-10-CM | POA: Diagnosis not present

## 2014-06-22 DIAGNOSIS — Z8679 Personal history of other diseases of the circulatory system: Secondary | ICD-10-CM | POA: Insufficient documentation

## 2014-06-22 DIAGNOSIS — Y9389 Activity, other specified: Secondary | ICD-10-CM | POA: Diagnosis not present

## 2014-06-22 DIAGNOSIS — H5442 Blindness, left eye, normal vision right eye: Secondary | ICD-10-CM | POA: Diagnosis not present

## 2014-06-22 DIAGNOSIS — Y9289 Other specified places as the place of occurrence of the external cause: Secondary | ICD-10-CM | POA: Diagnosis not present

## 2014-06-22 DIAGNOSIS — Y998 Other external cause status: Secondary | ICD-10-CM | POA: Diagnosis not present

## 2014-06-22 DIAGNOSIS — S43005A Unspecified dislocation of left shoulder joint, initial encounter: Secondary | ICD-10-CM

## 2014-06-22 DIAGNOSIS — Z8673 Personal history of transient ischemic attack (TIA), and cerebral infarction without residual deficits: Secondary | ICD-10-CM | POA: Diagnosis not present

## 2014-06-22 DIAGNOSIS — S43035A Inferior dislocation of left humerus, initial encounter: Secondary | ICD-10-CM | POA: Insufficient documentation

## 2014-06-22 DIAGNOSIS — W1839XA Other fall on same level, initial encounter: Secondary | ICD-10-CM | POA: Insufficient documentation

## 2014-06-22 DIAGNOSIS — Z8719 Personal history of other diseases of the digestive system: Secondary | ICD-10-CM | POA: Diagnosis not present

## 2014-06-22 DIAGNOSIS — Z7952 Long term (current) use of systemic steroids: Secondary | ICD-10-CM | POA: Insufficient documentation

## 2014-06-22 DIAGNOSIS — Z79899 Other long term (current) drug therapy: Secondary | ICD-10-CM | POA: Insufficient documentation

## 2014-06-22 DIAGNOSIS — M1990 Unspecified osteoarthritis, unspecified site: Secondary | ICD-10-CM | POA: Insufficient documentation

## 2014-06-22 DIAGNOSIS — S4992XA Unspecified injury of left shoulder and upper arm, initial encounter: Secondary | ICD-10-CM | POA: Diagnosis present

## 2014-06-22 DIAGNOSIS — Z7902 Long term (current) use of antithrombotics/antiplatelets: Secondary | ICD-10-CM | POA: Insufficient documentation

## 2014-06-22 MED ORDER — HYDROCODONE-ACETAMINOPHEN 5-325 MG PO TABS: 1.0000 | ORAL_TABLET | ORAL | Status: DC | PRN

## 2014-06-22 MED ORDER — PROPOFOL 10 MG/ML IV BOLUS
INTRAVENOUS | Status: AC | PRN
  Administered 2014-06-22: 30 mg via INTRAVENOUS

## 2014-06-22 MED ORDER — SODIUM CHLORIDE 0.9 % IV SOLN
INTRAVENOUS | Status: DC
  Administered 2014-06-22: 14:00:00 via INTRAVENOUS

## 2014-06-22 MED ORDER — ONDANSETRON HCL 4 MG PO TABS: 4.0000 mg | ORAL_TABLET | Freq: Four times a day (QID) | ORAL | Status: DC

## 2014-06-22 MED ORDER — FENTANYL CITRATE 0.05 MG/ML IJ SOLN
100.0000 ug | Freq: Once | INTRAMUSCULAR | Status: AC
  Administered 2014-06-22: 100 ug via INTRAVENOUS
  Filled 2014-06-22: qty 2

## 2014-06-22 MED ORDER — PROPOFOL 10 MG/ML IV BOLUS
0.5000 mg/kg | Freq: Once | INTRAVENOUS | Status: AC
  Filled 2014-06-22: qty 1

## 2014-06-22 MED ORDER — ONDANSETRON HCL 4 MG/2ML IJ SOLN
4.0000 mg | Freq: Once | INTRAMUSCULAR | Status: AC
  Administered 2014-06-22: 4 mg via INTRAVENOUS
  Filled 2014-06-22: qty 2

## 2014-06-22 NOTE — ED Notes (Signed)
Pt from home c/o left shoulder pain from a fall while she was chasing a chicken. Pain with movement.

## 2014-06-22 NOTE — Discharge Instructions (Signed)
Shoulder Dislocation  Shoulder dislocation is when your upper arm bone (humerus) is forced out of your shoulder joint. Your doctor will put your shoulder back into the joint by pulling on your arm or through surgery. Your arm will be placed in a shoulder immobilizer or sling. The shoulder immobilizer or sling holds your shoulder in place while it heals. HOME CARE   Rest your injured joint. Do not move it until instructed to do so.  Put ice on your injured joint as told by your doctor.  Put ice in a plastic bag.  Place a towel between your skin and the bag.  Leave the ice on for 15-20 minutes at a time, every 2 hours while you are awake.  Only take medicines as told by your doctor.  Squeeze a ball to exercise your hand. GET HELP RIGHT AWAY IF:   Your splint or sling becomes damaged.  Your pain becomes worse, not better.  You lose feeling in your arm or hand.  Your arm or hand becomes white or cold. MAKE SURE YOU:   Understand these instructions.  Will watch your condition.  Will get help right away if you are not doing well or get worse. Document Released: 06/01/2011 Document Reviewed: 06/01/2011 [facility] Patient Information 2015 [place]. This information is not intended to replace advice given to you by your health care provider. Make sure you discuss any questions you have with your health care provider.

## 2014-06-22 NOTE — ED Provider Notes (Signed)
CSN: [id number]     Arrival date & time 06/22/14  1238 History   First MD Initiated Contact with Patient 06/22/14 1252     Chief Complaint  Patient presents with  . Fall  . Shoulder Injury     (Consider location/radiation/quality/duration/timing/severity/associated sxs/prior Treatment) HPI   89yF with L shoulder pain after fall. Happened shortly before arrival. Pt's neighbor has chickens and sometimes they will come into her yard. She was trying to chase one back when she lost her balance and fell on to side. Persistent pain in L shoulder since. Denies significant pain anywhere else. No numbness or tingling. No hx of problems with this shoulder previously.   Past Medical History  Diagnosis Date  . Arthritis   . Blind left eye     Corneal transplant x3 with rejection  . Dysrhythmia   . Varicose veins     both legs and left fooot at arch  . Complication of anesthesia     sensitive to meds, bp can fall  . SVT (supraventricular tachycardia) 12/15/2011    seen by Dr. Caryl Comes  . Helicobacter pylori (H. pylori) 05/22/02    RUT-Positive  . Gastritis   . Diverticulosis   . Stroke   . Irregular heartbeat   . History of stroke 06/14/2014   Past Surgical History  Procedure Laterality Date  . Cholecystectomy    . Tonsillectomy    . Tonsillectomy    . Corneal transplant  right 2009, left 2009    both eyes, 3 in left eye, 1 in right eye  . Cataract surgery  2005    both eyes  . Abdominal hysterectomy   sept 1985    ovaries removed  . Total knee arthroplasty Left 06/27/2012    Procedure: LEFT TOTAL KNEE ARTHROPLASTY;  Surgeon: Gearlean Alf, MD;  Location: [facility];  Service: Orthopedics;  Laterality: Left;  . Tubal ligation    . Total knee arthroplasty Right 12/12/2012    Procedure: RIGHT TOTAL KNEE ARTHROPLASTY;  Surgeon: Gearlean Alf, MD;  Location: [facility];  Service: Orthopedics;  Laterality: Right;  . Loop recorder implant N/A 01/05/2014    Procedure: LOOP RECORDER IMPLANT;  Surgeon:  Coralyn Mark, MD;  Location: [place];  Service: Cardiovascular;  Laterality: N/A;   Family History  Problem Relation Age of Onset  . Stroke Mother   . Heart attack Father   . Heart attack Brother   . Cancer Brother   . Heart attack Brother   . Heart attack Brother   . Heart attack Brother   . Parkinsonism Brother    History  Substance Use Topics  . Smoking status: Never Smoker   . Smokeless tobacco: Never Used  . Alcohol Use: No   OB History    No data available     Review of Systems  All systems reviewed and negative, other than as noted in HPI.   Allergies  Ultram; Buprenex; Keflex; Other; Augmentin; Levofloxacin; Percocet; Sulfa antibiotics; Tape; and Zetia  Home Medications   Prior to Admission medications   Medication Sig Start Date End Date Taking? Authorizing Provider  amLODipine (NORVASC) 2.5 MG tablet Take 2 tablets to equal (5mg  ) by mouth daily 06/18/14   [name], MD  aspirin EC 81 MG EC tablet Take 1 tablet (81 mg total) by mouth daily. 01/22/14   Geradine Girt, DO  clopidogrel (PLAVIX) 75 MG tablet take 1 tablet by mouth once daily 05/15/14   [name],  MD  isosorbide mononitrate (IMDUR) 30 MG 24 hr tablet Take 1 tablet (30 mg total) by mouth daily. 02/13/14   [name], MD  Melatonin 1 MG SUBL Place 1 mg under the tongue at bedtime.    Historical Provider, MD  Multiple Vitamin (MULTI VITAMIN DAILY PO) Take 1 tablet by mouth every morning.    Historical Provider, MD  nebivolol (BYSTOLIC) 5 MG tablet Take 1 tablet (5 mg total) by mouth 2 (two) times daily. 12/05/13   [name], MD  nitroGLYCERIN (NITROSTAT) 0.4 MG SL tablet Place 1 tablet (0.4 mg total) under the tongue every 5 (five) minutes as needed for chest pain. 02/05/14   Imogene Burn, PA-C  prednisoLONE acetate (PRED FORTE) 1 % ophthalmic suspension Place 1 drop into both eyes 2 (two) times daily.     Historical Provider, MD  ranitidine (ZANTAC) 150 MG tablet Take 1 tablet  (150 mg total) by mouth at bedtime. 06/08/14   Lafayette Dragon, MD   BP 140/70 mmHg  Pulse 76  Temp(Src) 97.7 F (36.5 C) (Oral)  Resp 20  Wt 170 lb (77.111 kg)  SpO2 99% Physical Exam  Constitutional: She appears well-developed and well-nourished. No distress.  HENT:  Head: Normocephalic and atraumatic.  Eyes: Conjunctivae are normal. Right eye exhibits no discharge. Left eye exhibits no discharge.  Neck: Neck supple.  Cardiovascular: Normal rate, regular rhythm and normal heart sounds.  Exam reveals no gallop and no friction rub.   No murmur heard. Pulmonary/Chest: Effort normal and breath sounds normal. No respiratory distress.  Abdominal: Soft. She exhibits no distension. There is no tenderness.  Musculoskeletal:  Deformity to L shoulder consistent with dislocation. Humeral head feels displaced anteriorly/inferiorly to glenoid. Cannot range 2/2 pain. Closed injury. NVI.   Neurological: She is alert.  Skin: Skin is warm and dry.  Psychiatric: She has a normal mood and affect. Her behavior is normal. Thought content normal.  Nursing note and vitals reviewed.   ED Course  Procedures (including critical care time)  Procedural Sedation:  Preprocedure  Pre-anesthesia/induction confirmation of laterality/correct procedure site including "time-out."  Provider confirms review of the nurses' note, allergies, medications, pertinent labs, PMH, pre-induction vital signs, pulse oximetry, pain level, and ECG (as applicable), and patient condition satisfactory for commencing with order for sedation and procedure.   Medications: propofol Total time of sedation/monitoring: 30 minutes  Patient tolerated procedure and procedural sedation component.  She did has brief oxygen desaturation which recovered quickly with supplemental o2 and brief bagging.  Physician confirms procedural medication orders as administered, patient was assessed by physician post-procedure, and confirms post-sedation plan  of care and disposition.   Reduction of dislocation Date/Time: 1415 Performed by: Virgel Manifold Authorized by: Virgel Manifold Consent: Verbal consent obtained. Risks and benefits: risks, benefits and alternatives were discussed Consent given by: patient Required items: required blood products, implants, devices, and special equipment available Time out: Immediately prior to procedure a "time out" was called to verify the correct patient, procedure, equipment, support staff and site/side marked as required.  Patient sedated: propofol  Vitals: Vital signs were monitored during sedation. Patient tolerance: Patient tolerated the procedure well with no immediate complications. Joint: L shoulder Reduction technique: axial traction  Labs Review Labs Reviewed - No data to display  Imaging Review No results found.   EKG Interpretation None      MDM   Final diagnoses:  Shoulder dislocation, left, initial encounter    89yF with closed L shoulder dislocation. Reduced.  Remains NVI. Ortho FU. It has been determined that no acute conditions requiring further emergency intervention are present at this time. The patient has been advised of the diagnosis and plan. I reviewed any labs and imaging including any potential incidental findings. We have discussed signs and symptoms that warrant return to the ED and they are listed in the discharge instructions.      Virgel Manifold, MD 06/24/14 [phone]

## 2014-06-27 ENCOUNTER — Telehealth: Payer: Self-pay | Admitting: Internal Medicine

## 2014-06-27 NOTE — Telephone Encounter (Signed)
OK to call in Flafyl 250 mg po tid, #30

## 2014-06-27 NOTE — Telephone Encounter (Signed)
Left a message for EC to call back.

## 2014-06-27 NOTE — Telephone Encounter (Signed)
Spoke with patient's daughter and she states patient is having pain in left side and mucous in stools. She is asking if she can have Flagyl called in. Please, advise.

## 2014-06-28 ENCOUNTER — Ambulatory Visit: Payer: Medicare Other | Admitting: Cardiology

## 2014-06-28 MED ORDER — METRONIDAZOLE 250 MG PO TABS: 250.0000 mg | ORAL_TABLET | Freq: Three times a day (TID) | ORAL | Status: DC

## 2014-06-28 NOTE — Telephone Encounter (Signed)
Rx sent to pharmacy. Patient's daughter aware.

## 2014-06-29 ENCOUNTER — Ambulatory Visit (INDEPENDENT_AMBULATORY_CARE_PROVIDER_SITE_OTHER)
Admission: RE | Admit: 2014-06-29 | Discharge: 2014-06-29 | Disposition: A | Discharge status: Home / Self Care | Payer: Medicare Other | Source: Ambulatory Visit | Attending: Internal Medicine | Admitting: Internal Medicine

## 2014-06-29 ENCOUNTER — Encounter: Payer: Self-pay | Admitting: Internal Medicine

## 2014-06-29 ENCOUNTER — Other Ambulatory Visit: Payer: Self-pay

## 2014-06-29 ENCOUNTER — Telehealth: Payer: Self-pay | Admitting: Internal Medicine

## 2014-06-29 DIAGNOSIS — R1013 Epigastric pain: Secondary | ICD-10-CM

## 2014-06-29 NOTE — Telephone Encounter (Signed)
Spoke with Ms Kevan Ny. She will bring the patient to [facility] for the KUB.

## 2014-06-29 NOTE — Telephone Encounter (Signed)
She will get her KUB today at [facility].

## 2014-06-29 NOTE — Telephone Encounter (Signed)
Please obtain KUB today. R/o obstruction

## 2014-06-29 NOTE — Telephone Encounter (Signed)
Patient's daughter calling to report she is having mucous but not any stool from rectum. States patient still feels bad. She started Flagyl on Wednesday. Please, advise.

## 2014-06-29 NOTE — Telephone Encounter (Signed)
I called pt to tell her that her KUB shows lot of stool in her colon ans asked her to  Purchase 1 bottle of Mag citrate. She was slightly confused. I am not sure she  Got the instructions. Please ca;; her daughter  [phone] mobile to  Repeat the instructions. Thanx

## 2014-07-02 NOTE — Telephone Encounter (Signed)
Spoke with patient's daughter. She did the Magnesium citrate and had a good bowel movement.

## 2014-07-03 ENCOUNTER — Ambulatory Visit: Payer: Medicare Other | Admitting: Neurology

## 2014-07-04 ENCOUNTER — Ambulatory Visit (INDEPENDENT_AMBULATORY_CARE_PROVIDER_SITE_OTHER): Payer: Medicare Other | Admitting: *Deleted

## 2014-07-04 DIAGNOSIS — I639 Cerebral infarction, unspecified: Secondary | ICD-10-CM | POA: Diagnosis not present

## 2014-07-06 NOTE — Progress Notes (Signed)
Loop recorder 

## 2014-07-10 ENCOUNTER — Ambulatory Visit: Payer: Medicare Other | Admitting: Cardiovascular Disease

## 2014-07-14 ENCOUNTER — Other Ambulatory Visit: Payer: Self-pay | Admitting: Cardiovascular Disease

## 2014-08-03 ENCOUNTER — Ambulatory Visit (INDEPENDENT_AMBULATORY_CARE_PROVIDER_SITE_OTHER): Payer: Medicare Other | Admitting: *Deleted

## 2014-08-03 DIAGNOSIS — I639 Cerebral infarction, unspecified: Secondary | ICD-10-CM | POA: Diagnosis not present

## 2014-08-07 LAB — CUP PACEART REMOTE DEVICE CHECK: Date Time Interrogation Session: 20160517132527

## 2014-08-10 NOTE — Progress Notes (Signed)
Loop recorder 

## 2014-08-14 ENCOUNTER — Other Ambulatory Visit: Payer: Self-pay | Admitting: Cardiovascular Disease

## 2014-08-17 ENCOUNTER — Ambulatory Visit (INDEPENDENT_AMBULATORY_CARE_PROVIDER_SITE_OTHER): Payer: Medicare Other | Admitting: Cardiovascular Disease

## 2014-08-17 ENCOUNTER — Encounter: Payer: Self-pay | Admitting: Cardiovascular Disease

## 2014-08-17 VITALS — BP 110/56 | HR 74 | Ht 62.5 in | Wt 171.6 lb

## 2014-08-17 DIAGNOSIS — I10 Essential (primary) hypertension: Secondary | ICD-10-CM | POA: Diagnosis not present

## 2014-08-17 DIAGNOSIS — I471 Supraventricular tachycardia: Secondary | ICD-10-CM | POA: Diagnosis not present

## 2014-08-17 NOTE — Progress Notes (Signed)
Cardiology Office Note Date:  08/18/2014   ID:  [name], DOB 1924/04/07, MRN [id number]  PCP:  Marjorie Smolder, MD  Cardiologist:  [name], MD    Chief Complaint  Patient presents with  . Dizziness    History of Present Illness: [name] [name] is a 81 y.o. female who presents for follow-up evaluation.  She is followed for coronary artery disease , cerebrovascular disease, and palpitations. She's had a non-ST elevation MI treated conservatively. A Lexiscan Myoview stress study was low risk with an ejection fraction of 74% and no major ischemia identified. When she presented with her stroke earlier this year, a loop recorder was implanted. Other medical problems include essential hypertension and SVT. She's had normal systolic function based on echocardiography.  The patient has had improvement in her chest pain since I saw her last. However, she continues to have problems with dizziness. She describes frequent episodes of dizziness, both with postural changes and at other times. This generally occurs when she has been up on her feet for a while. She has not had frank syncope, but she does admit to feeling very weak and this requires her to sit down. She denies shortness of breath, orthopnea, or PND. She develops nausea associated with her dizzy spells. Other complaints include excessive fatigue and sweating.  Past Medical History  Diagnosis Date  . Arthritis   . Blind left eye     Corneal transplant x3 with rejection  . Dysrhythmia   . Varicose veins     both legs and left fooot at arch  . Complication of anesthesia     sensitive to meds, bp can fall  . SVT (supraventricular tachycardia) 12/15/2011    seen by Dr. Caryl Comes  . Helicobacter pylori (H. pylori) 05/22/02    RUT-Positive  . Gastritis   . Diverticulosis   . Stroke   . Irregular heartbeat   . History of stroke 06/14/2014    Past Surgical History  Procedure Laterality Date  . Cholecystectomy    . Tonsillectomy     . Tonsillectomy    . Corneal transplant  right 2009, left 2009    both eyes, 3 in left eye, 1 in right eye  . Cataract surgery  2005    both eyes  . Abdominal hysterectomy   sept 1985    ovaries removed  . Total knee arthroplasty Left 06/27/2012    Procedure: LEFT TOTAL KNEE ARTHROPLASTY;  Surgeon: Gearlean Alf, MD;  Location: [facility];  Service: Orthopedics;  Laterality: Left;  . Tubal ligation    . Total knee arthroplasty Right 12/12/2012    Procedure: RIGHT TOTAL KNEE ARTHROPLASTY;  Surgeon: Gearlean Alf, MD;  Location: [facility];  Service: Orthopedics;  Laterality: Right;  . Loop recorder implant N/A 01/05/2014    Procedure: LOOP RECORDER IMPLANT;  Surgeon: Coralyn Mark, MD;  Location: [facility];  Service: Cardiovascular;  Laterality: N/A;    Current Outpatient Prescriptions  Medication Sig Dispense Refill  . amLODipine (NORVASC) 2.5 MG tablet Take 2.5 mg by mouth daily as needed.    Marland Kitchen aspirin EC 81 MG EC tablet Take 1 tablet (81 mg total) by mouth daily.    Marland Kitchen BYSTOLIC 5 MG tablet take 1 tablet by mouth twice a day 60 tablet 6  . clopidogrel (PLAVIX) 75 MG tablet take 1 tablet by mouth once daily 30 tablet 1  . Melatonin 1 MG SUBL Place 1 mg under the tongue at bedtime.    Marland Kitchen  Multiple Vitamin (MULTI VITAMIN DAILY PO) Take 1 tablet by mouth every morning.    . nitroGLYCERIN (NITROSTAT) 0.4 MG SL tablet Place 1 tablet (0.4 mg total) under the tongue every 5 (five) minutes as needed for chest pain. 25 tablet 3  . prednisoLONE acetate (PRED FORTE) 1 % ophthalmic suspension Place 1 drop into both eyes 2 (two) times daily.     . ranitidine (ZANTAC) 150 MG tablet Take 1 tablet (150 mg total) by mouth at bedtime. 30 tablet 3  . traMADol (ULTRAM) 50 MG tablet Take 50 mg by mouth as needed for moderate pain.   0  . amLODipine (NORVASC) 2.5 MG tablet Take 2 tablets to equal (5mg  ) by mouth daily (Patient not taking: Reported on 08/17/2014) 60 tablet 3   No current facility-administered  medications for this visit.    Allergies:   Ultram; Buprenex; Keflex; Other; Augmentin; Levofloxacin; Percocet; Sulfa antibiotics; Tape; and Zetia   Social History:  The patient  reports that she has never smoked. She has never used smokeless tobacco. She reports that she does not drink alcohol or use illicit drugs.   Family History:  The patient's  family history includes Cancer in her brother; Heart attack in her brother, brother, brother, brother, and father; Parkinsonism in her brother; Stroke in her mother.    ROS:  Please see the history of present illness.  Otherwise, review of systems is positive for dizziness, easy bruising, fatigue, leg cramps, nausea, balance problems.  All other systems are reviewed and negative.    PHYSICAL EXAM: VS:  BP 110/56 mmHg  Pulse 74  Ht 5' 2.5" (1.588 m)  Wt 171 lb 9.6 oz (77.837 kg)  BMI 30.87 kg/m2 , BMI Body mass index is 30.87 kg/(m^2). GEN: Well nourished, well developed, pleasant elderly woman in no acute distress HEENT: normal, chronic left eye abnormalities unchanged (patient blind in that eye) Neck: no JVD, no masses. No carotid bruits Cardiac: RRR without murmur or gallop                Respiratory:  clear to auscultation bilaterally, normal work of breathing GI: soft, nontender, nondistended, + BS MS: no deformity or atrophy Ext: no pretibial edema, pedal pulses 2+= bilaterally Skin: warm and dry, no rash Neuro:  Strength and sensation are intact Psych: euthymic mood, full affect  EKG:  EKG is ordered today. The ekg ordered today shows normal sinus rhythm 74 bpm, within normal limits.  Recent Labs: 01/21/2014: BUN 16; Creatinine 0.64; Potassium 4.1; Sodium 144 01/22/2014: Hemoglobin 13.2; Platelets 156   Lipid Panel     Component Value Date/Time   CHOL 158 01/05/2014 0346   TRIG 92 01/05/2014 0346   HDL 49 01/05/2014 0346   CHOLHDL 3.2 01/05/2014 0346   VLDL 18 01/05/2014 0346   LDLCALC 91 01/05/2014 0346     Wt  Readings from Last 3 Encounters:  08/17/14 171 lb 9.6 oz (77.837 kg)  06/22/14 170 lb (77.111 kg)  06/14/14 171 lb 3.2 oz (77.656 kg)    Cardiac Studies Reviewed: 2D Echo 01/21/2014: Study Conclusions  - Left ventricle: The cavity size was normal. Systolic function was normal. Wall motion was normal; there were no regional wall motion abnormalities. - Aortic valve: There was mild regurgitation. - Atrial septum: No defect or patent foramen ovale was identified.  ASSESSMENT AND PLAN: 1.  Old MI, no anginal symptoms. The patient has not undergone cardiac catheterization. Following a non-STEMI last year she underwent a Myoview stress  test which was low risk. We have treated her medically. Considering all of her dizziness, I have recommended that we stop isosorbide and hopefully this will help alleviate this symptom.  2. Stroke: The patient has an implantable loop recorder which has not demonstrated evidence of atrial fibrillation. She continues on antiplatelets monotherapy with clopidogrel.  3. Essential hypertension, uncontrolled: The patient has labile blood pressures. She brings readings in from home with the majority of her readings in good range. However, she said if you systolic blood pressures greater than 200.   Difficult to titrate her medications in the setting of dizziness. Will follow for now.   Current medicines are reviewed with the patient today.  The patient does not have concerns regarding medicines.  Labs/ tests ordered today include:   Orders Placed This Encounter  Procedures  . EKG 12-Lead    Disposition:   FU 4 months  Signed, [name], MD  08/18/2014 5:21 PM    [address], [place], [place]  [address] Phone: [phone]; Fax: [phone]

## 2014-08-17 NOTE — Patient Instructions (Signed)
Medication Instructions:  Your physician has recommended you make the following change in your medication:  1. STOP Imdur  Labwork: No new orders.   Testing/Procedures: No new orders.   Follow-Up: Your physician recommends that you schedule a follow-up appointment in: 4 MONTHS with Dr Burt Knack    Any Other Special Instructions Will Be Listed Below (If Applicable).

## 2014-08-22 LAB — CUP PACEART REMOTE DEVICE CHECK: MDC IDC SESS DTM: 20160601155905

## 2014-08-23 ENCOUNTER — Encounter: Payer: Self-pay | Admitting: Internal Medicine

## 2014-09-03 ENCOUNTER — Ambulatory Visit (INDEPENDENT_AMBULATORY_CARE_PROVIDER_SITE_OTHER): Payer: Medicare Other | Admitting: *Deleted

## 2014-09-03 ENCOUNTER — Encounter: Payer: Self-pay | Admitting: Internal Medicine

## 2014-09-03 DIAGNOSIS — I639 Cerebral infarction, unspecified: Secondary | ICD-10-CM

## 2014-09-03 LAB — CUP PACEART REMOTE DEVICE CHECK: MDC IDC SESS DTM: 20160613161316

## 2014-09-03 NOTE — Progress Notes (Signed)
Loop recorder 

## 2014-09-18 ENCOUNTER — Encounter: Payer: Self-pay | Admitting: Internal Medicine

## 2014-10-02 ENCOUNTER — Ambulatory Visit (INDEPENDENT_AMBULATORY_CARE_PROVIDER_SITE_OTHER): Payer: Medicare Other | Admitting: *Deleted

## 2014-10-02 DIAGNOSIS — I639 Cerebral infarction, unspecified: Secondary | ICD-10-CM

## 2014-10-04 NOTE — Progress Notes (Signed)
Loop recorder 

## 2014-10-08 ENCOUNTER — Other Ambulatory Visit: Payer: Self-pay

## 2014-10-08 MED ORDER — RANITIDINE HCL 150 MG PO TABS: 150.0000 mg | ORAL_TABLET | Freq: Every day | ORAL | Status: DC

## 2014-10-19 ENCOUNTER — Other Ambulatory Visit: Payer: Self-pay | Admitting: Cardiovascular Disease

## 2014-10-23 ENCOUNTER — Telehealth: Payer: Self-pay | Admitting: Cardiovascular Disease

## 2014-10-23 NOTE — Telephone Encounter (Signed)
Pt c/o medication issue:  1. Name of Medication: Plavix  2. How are you currently taking this medication (dosage and times per day)? 75 mg 1x per day  3. Are you having a reaction (difficulty breathing--STAT)?   4. What is your medication issue? Pt calling stating that she feels weak and tired

## 2014-10-24 NOTE — Telephone Encounter (Signed)
Left message on machine for pt to contact the office.   

## 2014-10-24 NOTE — Telephone Encounter (Signed)
I spoke with the pt's daughter [name] [name] and she said the pt is complaining of dizziness, fatigue, abdominal pain and dark stools.  The pt has been taking meclizine for dizziness. The pt is taking Aspirin and Plavix and is at risk for GI bleeding. The pt is scheduled to see Dr Olevia Perches on 11/06/14. I made [name] [name] aware that she needs to contact GI to see if they can assess the pt this week and check labs.  The last CBC in her chart was November 2015. [name] [name] agreed with plan and will contact GI.

## 2014-10-30 LAB — CUP PACEART REMOTE DEVICE CHECK
Date Time Interrogation Session: 20160624155702
MDC IDC SET ZONE DETECTION INTERVAL: 3000 ms
MDC IDC SET ZONE DETECTION INTERVAL: 420 ms
Zone Setting Detection Interval: 2000 ms

## 2014-11-01 ENCOUNTER — Ambulatory Visit (INDEPENDENT_AMBULATORY_CARE_PROVIDER_SITE_OTHER): Payer: Medicare Other | Admitting: *Deleted

## 2014-11-01 DIAGNOSIS — I639 Cerebral infarction, unspecified: Secondary | ICD-10-CM | POA: Diagnosis not present

## 2014-11-06 ENCOUNTER — Other Ambulatory Visit (INDEPENDENT_AMBULATORY_CARE_PROVIDER_SITE_OTHER): Payer: Medicare Other

## 2014-11-06 ENCOUNTER — Ambulatory Visit (INDEPENDENT_AMBULATORY_CARE_PROVIDER_SITE_OTHER): Payer: Medicare Other | Admitting: Internal Medicine

## 2014-11-06 ENCOUNTER — Encounter: Payer: Self-pay | Admitting: Internal Medicine

## 2014-11-06 VITALS — BP 128/64 | HR 60 | Ht 62.5 in | Wt 175.8 lb

## 2014-11-06 DIAGNOSIS — R1032 Left lower quadrant pain: Secondary | ICD-10-CM | POA: Diagnosis not present

## 2014-11-06 DIAGNOSIS — K5732 Diverticulitis of large intestine without perforation or abscess without bleeding: Secondary | ICD-10-CM | POA: Diagnosis not present

## 2014-11-06 LAB — BASIC METABOLIC PANEL
BUN: 16 mg/dL (ref 6–23)
CO2: 33 mEq/L — ABNORMAL HIGH (ref 19–32)
Calcium: 9.3 mg/dL (ref 8.4–10.5)
Chloride: 101 mEq/L (ref 96–112)
Creatinine, Ser: 0.66 mg/dL (ref 0.40–1.20)
GFR: 89.88 mL/min (ref >60.00)
GLUCOSE: 102 mg/dL — AB (ref 70–99)
POTASSIUM: 4.2 meq/L (ref 3.5–5.1)
Sodium: 139 mEq/L (ref 135–145)

## 2014-11-06 LAB — CBC WITH DIFFERENTIAL/PLATELET
BASOS PCT: 0.4 % (ref 0.0–3.0)
Basophils Absolute: 0 10*3/uL (ref 0.0–0.1)
Eosinophils Absolute: 0.1 10*3/uL (ref 0.0–0.7)
Eosinophils Relative: 1.3 % (ref 0.0–5.0)
HEMATOCRIT: 43.4 % (ref 36.0–46.0)
HEMOGLOBIN: 14.6 g/dL (ref 12.0–15.0)
LYMPHS PCT: 29.5 % (ref 12.0–46.0)
Lymphs Abs: 1.5 10*3/uL (ref 0.7–4.0)
MCHC: 33.6 g/dL (ref 30.0–36.0)
MCV: 93.8 fl (ref 78.0–100.0)
Monocytes Absolute: 0.5 10*3/uL (ref 0.1–1.0)
Monocytes Relative: 10.6 % (ref 3.0–12.0)
Neutro Abs: 3 10*3/uL (ref 1.4–7.7)
Neutrophils Relative %: 58.2 % (ref 43.0–77.0)
Platelets: 208 10*3/uL (ref 150.0–400.0)
RBC: 4.63 Mil/uL (ref 3.87–5.11)
RDW: 13.6 % (ref 11.5–15.5)
WBC: 5.1 10*3/uL (ref 4.0–10.5)

## 2014-11-06 LAB — SEDIMENTATION RATE: Sed Rate: 10 mm/hr (ref 0–22)

## 2014-11-06 MED ORDER — HYOSCYAMINE SULFATE 0.125 MG SL SUBL: 0.1250 mg | SUBLINGUAL_TABLET | SUBLINGUAL | Status: DC | PRN

## 2014-11-06 MED ORDER — METRONIDAZOLE 250 MG PO TABS: 250.0000 mg | ORAL_TABLET | Freq: Three times a day (TID) | ORAL | Status: DC

## 2014-11-06 MED ORDER — CIPROFLOXACIN HCL 500 MG PO TABS: 500.0000 mg | ORAL_TABLET | Freq: Two times a day (BID) | ORAL | Status: DC

## 2014-11-06 NOTE — Progress Notes (Signed)
Loop recorder 

## 2014-11-06 NOTE — Progress Notes (Signed)
[name] [name] 30-Aug-1924 [id number]  Note: This dictation was prepared with Dragon digital system. Any transcriptional errors that result from this procedure are unintentional.   History o 81 year old white female with chronic symptomatic diverticulosis and left lower quadrant abdominal pain. She has been having severe pain on the left side  since last September 2015, while vacationing at [employer]. CT scan of the abdomen in the local hospital showed  diverticulitis with questionable confined perforation. She was sent home on  twice a day Cipro and Flagyl  and took several courses of it between September and January 2016. She also has mild  prominence of intrahepatic radicles secondary to prior cholecystectomy and she has a cystocele. Prior colonoscopy in 2004 showed diverticulosis. She was treated for positive H. pylori in 2004. The pain is throbbing. There has been no fever or rectal bleeding. It sometimes wakes her up at night. She is having 4-5 bowel movements daily      Past Medical History  Diagnosis Date  . Arthritis   . Blind left eye     Corneal transplant x3 with rejection  . Dysrhythmia   . Varicose veins     both legs and left fooot at arch  . Complication of anesthesia     sensitive to meds, bp can fall  . SVT (supraventricular tachycardia) 12/15/2011    seen by Dr. Caryl Comes  . Helicobacter pylori (H. pylori) 05/22/02    RUT-Positive  . Gastritis   . Diverticulosis   . Stroke   . Irregular heartbeat   . History of stroke 06/14/2014    Past Surgical History  Procedure Laterality Date  . Cholecystectomy    . Tonsillectomy    . Tonsillectomy    . Corneal transplant  right 2009, left 2009    both eyes, 3 in left eye, 1 in right eye  . Cataract surgery  2005    both eyes  . Abdominal hysterectomy   sept 1985    ovaries removed  . Total knee arthroplasty Left 06/27/2012    Procedure: LEFT TOTAL KNEE ARTHROPLASTY;  Surgeon: Gearlean Alf, MD;  Location: [facility];  Service:  Orthopedics;  Laterality: Left;  . Tubal ligation    . Total knee arthroplasty Right 12/12/2012    Procedure: RIGHT TOTAL KNEE ARTHROPLASTY;  Surgeon: Gearlean Alf, MD;  Location: [facility];  Service: Orthopedics;  Laterality: Right;  . Loop recorder implant N/A 01/05/2014    Procedure: LOOP RECORDER IMPLANT;  Surgeon: Coralyn Mark, MD;  Location: [facility];  Service: Cardiovascular;  Laterality: N/A;    Allergies  Allergen Reactions  . Ultram [Tramadol] Hives  . Buprenex [Buprenorphine Hcl] Other (See Comments)    Heart raced  . Keflex [Cephalexin] Diarrhea  . Other Swelling    Yellow eye dye that is used to check retina. Caused swelling of tongue.  . Augmentin [Amoxicillin-Pot Clavulanate] Nausea And Vomiting  . Levofloxacin Other (See Comments)    levaquin unknown reaction  . Percocet [Oxycodone-Acetaminophen] Nausea And Vomiting and Other (See Comments)    "felt drained"  . Sulfa Antibiotics Other (See Comments)    unknown  . Tape Other (See Comments)    Adhesive tape causes skin to pull off  . Zetia [Ezetimibe] Other (See Comments)    unknown    Family history and social history have been reviewed.  Review of Systems: Lower abdominal pain. Denies all rectal bleeding. Positive for frequent stools  The remainder of the 10 point ROS is  negative except as outlined in the H&P  Physical Exam: General Appearance Well developed, in no distress Eyes  Non icteric  HEENT  Non traumatic, normocephalic  Mouth No lesion, tongue papillated, no cheilosis Neck Supple without adenopathy, thyroid not enlarged, no carotid bruits, no JVD Lungs Clear to auscultation bilaterally COR Normal S1, normal S2, regular rhythm, no murmur, quiet precordium Abdomen very tender in left lower quadrant. No rebound or mass. Normal bowel sounds. Rest of the abdominal exam is unremarkable. Rectal not done Extremities  No pedal edema Skin No lesions Neurological Alert and oriented x 3 Psychological  Normal mood and affect  Assessment and Plan:   81 year old white female with symptomatic diverticulosis and history of diverticulitis. She has exacerbation of the left-sided abdominal pain and I am concern about possibility off for recurrent infection. We will proceed with CT scan of the abdomen and pelvis with IV and oral contrast and begin Cipro 500 mg twice a day for 14 days as well as Flagyl 250 mg 3 times a day for 14 days. She will also take Levsin sublingually 0.125 mg before meals when necessary crampy abdominal pain and diarrhea. We're checking her CBC ,c- met and sedimentation rate today. She is not a candidate for colonoscopy due to her age    [name] [name] 11/06/2014

## 2014-11-06 NOTE — Patient Instructions (Signed)
You have been scheduled for a CT scan of the abdomen and pelvis at [facility] ([address]---this is in the same building as Press [facility]).   You are scheduled on 11/08/2014 at 1:30pm. You should arrive 15 minutes prior to your appointment time for registration. Please follow the written instructions below on the day of your exam:  WARNING: IF YOU ARE ALLERGIC TO IODINE/X-RAY DYE, PLEASE NOTIFY RADIOLOGY IMMEDIATELY AT [phone]! YOU WILL BE GIVEN A 13 HOUR PREMEDICATION PREP.  1) Do not eat or drink anything after 9:30am (4 hours prior to your test) 2) You have been given 2 bottles of oral contrast to drink. The solution may taste               better if refrigerated, but do NOT add ice or any other liquid to this solution. Shake             well before drinking.    Drink 1 bottle of contrast @ 11:30am (2 hours prior to your exam)  Drink 1 bottle of contrast @ 12:30pm (1 hour prior to your exam)  You may take any medications as prescribed with a small amount of water except for the following: Metformin, Glucophage, Glucovance, Avandamet, Riomet, Fortamet, Actoplus Met, Janumet, Glumetza or Metaglip. The above medications must be held the day of the exam AND 48 hours after the exam.  The purpose of you drinking the oral contrast is to aid in the visualization of your intestinal tract. The contrast solution may cause some diarrhea. Before your exam is started, you will be given a small amount of fluid to drink. Depending on your individual set of symptoms, you may also receive an intravenous injection of x-ray contrast/dye. Plan on being at [facility] for 30 minutes or long, depending on the type of exam you are having performed.  This test typically takes 30-45 minutes to complete.  If you have any questions regarding your exam or if you need to reschedule, you may call the CT department at [phone] between the hours of 8:00 am and 5:00 pm,  Monday-Friday.  ________________________________________________________________________  Go to the basement for labs today We are sending in your prescriptions to your pharmacy Stay on Full Liquid diet for 7 days   Full Liquid Diet A full liquid diet may be used:   To help you transition from a clear liquid diet to a soft diet.   When your body is healing and can only tolerate foods that are easy to digest.  Before or after certain a procedure, test, or surgery (such as stomach or intestinal surgeries).   If you have trouble swallowing or chewing.  A full liquid diet includes fluids and foods that are liquid or will become liquid at room temperature. The full liquid diet gives you the proteins, fluids, salts, and minerals that you need for energy. If you continue this diet for more than 72 hours, talk to your health care provider about how many calories you need to consume. If you continue the diet for more than 5 days, talk to your health care provider about taking a multivitamin or a nutritional supplement. WHAT DO I NEED TO KNOW ABOUT A FULL LIQUID DIET?  You may have any liquid.  You may have any food that becomes a liquid at room temperature. The food is considered a liquid if it can be poured off a spoon at room temperature.  Drink one serving of citrus or vitamin C-enriched fruit  juice daily. WHAT FOODS CAN I EAT? Grains Any grain food that can be pureed in soup (such as crackers, pasta, and rice). Hot cereal (such as farina or oatmeal) that has been blended. Talk to your health care provider or dietitian about these foods. Vegetables Pulp-free tomato or vegetable juice. Vegetables pureed in soup.  Fruits Fruit juice, including nectars and juices with pulp. Meats and Other Protein Sources Eggs in custard, eggnog mix, and eggs used in ice cream or pudding. Strained meats, like in baby food, may be allowed. Consult your health care provider.  Dairy Milk and milk-based  beverages, including milk shakes and instant breakfast mixes. Smooth yogurt. Pureed cottage cheese. Avoid these foods if they are not well tolerated. Beverages All beverages, including liquid nutritional supplements. Ask your health care provider if you can have carbonated beverages. They may not be well tolerated. Condiments Iodized salt, pepper, spices, and flavorings. Cocoa powder. Vinegar, ketchup, yellow mustard, smooth sauces (such as hollandaise, cheese sauce, or white sauce), and soy sauce. Sweets and Desserts Custard, smooth pudding. Flavored gelatin. Tapioca, junket. Plain ice cream, sherbet, fruit ices. Frozen ice pops, frozen fudge pops, pudding pops, and other frozen bars with cream. Syrups, including chocolate syrup. Sugar, honey, jelly.  Fats and Oils Margarine, butter, cream, sour cream, and oils. Other Broth and cream soups. Strained, broth-based soups. The items listed above may not be a complete list of recommended foods or beverages. Contact your dietitian for more options.  WHAT FOODS CAN I NOT EAT? Grains All breads. Grains are not allowed unless they are pureed into soup. Vegetables Vegetables are not allowed unless they are juiced, or cooked and pureed into soup. Fruits Fruits are not allowed unless they are juiced. Meats and Other Protein Sources Any meat or fish. Cooked or raw eggs. Nut butters.  Dairy Cheese.  Condiments Stone ground mustards. Fats and Oils Fats that are coarse or chunky. Sweets and Desserts Ice cream or other frozen desserts that have any solids in them or on top, such as nuts, chocolate chips, and pieces of cookies. Cakes. Cookies. Candy. Others Soups with chunks or pieces in them. The items listed above may not be a complete list of foods and beverages to avoid. Contact your dietitian for more information. Document Released: 03/09/2005 Document Revised: 03/14/2013 Document Reviewed: 01/12/2013 [facility] Patient Information 2015  [place], [place]. This information is not intended to replace advice given to you by your health care provider. Make sure you discuss any questions you have with your health care provider.

## 2014-11-07 ENCOUNTER — Telehealth: Payer: Self-pay

## 2014-11-07 ENCOUNTER — Telehealth: Payer: Self-pay | Admitting: Internal Medicine

## 2014-11-07 MED ORDER — RANITIDINE HCL 150 MG PO TABS
150.0000 mg | ORAL_TABLET | Freq: Every day | ORAL | Status: DC
Start: 2014-11-07 — End: 2015-03-15

## 2014-11-07 NOTE — Telephone Encounter (Signed)
Patient wants to clarify CT instructions. Reviewed instructions. Also clarified that patient is to be on a full liquid diet until results of CT are back. Per Dr. Olevia Perches, wants to give bowels a rest maybe up to a week depending on CT results. Patient to start antibiotics also.

## 2014-11-07 NOTE — Telephone Encounter (Signed)
Refilled Zantac 

## 2014-11-08 ENCOUNTER — Encounter: Payer: Self-pay | Admitting: Internal Medicine

## 2014-11-08 ENCOUNTER — Ambulatory Visit (INDEPENDENT_AMBULATORY_CARE_PROVIDER_SITE_OTHER)
Admission: RE | Admit: 2014-11-08 | Discharge: 2014-11-08 | Disposition: A | Discharge status: Home / Self Care | Payer: Medicare Other | Source: Ambulatory Visit | Attending: Internal Medicine | Admitting: Internal Medicine

## 2014-11-08 DIAGNOSIS — K5732 Diverticulitis of large intestine without perforation or abscess without bleeding: Secondary | ICD-10-CM | POA: Diagnosis not present

## 2014-11-08 MED ORDER — IOHEXOL 300 MG/ML  SOLN
100.0000 mL | Freq: Once | INTRAMUSCULAR | Status: AC | PRN
  Administered 2014-11-08: 100 mL via INTRAVENOUS

## 2014-11-09 ENCOUNTER — Telehealth: Payer: Self-pay | Admitting: *Deleted

## 2014-11-09 NOTE — Telephone Encounter (Signed)
Patient with patient and she wants to know if CT has any narrowing in her colon. Reviewed results with patient.(no abnormality) Patient will continue antibiotics and eat soft diet.

## 2014-11-19 LAB — CUP PACEART REMOTE DEVICE CHECK: Date Time Interrogation Session: 20160829092217

## 2014-11-28 ENCOUNTER — Encounter: Payer: Self-pay | Admitting: Internal Medicine

## 2014-11-30 ENCOUNTER — Ambulatory Visit (INDEPENDENT_AMBULATORY_CARE_PROVIDER_SITE_OTHER): Payer: Medicare Other | Admitting: *Deleted

## 2014-11-30 DIAGNOSIS — I639 Cerebral infarction, unspecified: Secondary | ICD-10-CM | POA: Diagnosis not present

## 2014-12-03 NOTE — Progress Notes (Signed)
Loop recorder 

## 2014-12-08 ENCOUNTER — Other Ambulatory Visit: Payer: Self-pay | Admitting: Cardiovascular Disease

## 2014-12-12 LAB — CUP PACEART REMOTE DEVICE CHECK: MDC IDC SESS DTM: 20160910223644

## 2014-12-12 NOTE — Progress Notes (Signed)
Carelink summary report received. Battery status OK. Normal device function. No new symptom episodes, tachy episodes, brady, or pause episodes. No new AF episodes. Monthly summary reports and ROV with JA PRN. 

## 2014-12-13 ENCOUNTER — Encounter: Payer: Self-pay | Admitting: Cardiovascular Disease

## 2014-12-13 ENCOUNTER — Ambulatory Visit (INDEPENDENT_AMBULATORY_CARE_PROVIDER_SITE_OTHER): Payer: Medicare Other | Admitting: Cardiovascular Disease

## 2014-12-13 VITALS — BP 144/76 | HR 71 | Ht 62.0 in | Wt 176.4 lb

## 2014-12-13 DIAGNOSIS — I252 Old myocardial infarction: Secondary | ICD-10-CM

## 2014-12-13 DIAGNOSIS — I10 Essential (primary) hypertension: Secondary | ICD-10-CM

## 2014-12-13 NOTE — Progress Notes (Signed)
Cardiology Office Note Date:  12/13/2014   ID:  [name] [name], DOB 1924/04/26, MRN [id number]  PCP:  Marjorie Smolder, MD  Cardiologist:  [name], MD    Chief Complaint  Patient presents with  . Shortness of Breath    History of Present Illness: [name] [name] is a 81 y.o. female who presents for follow-up of CAD, cerebrovascular disease, and heart palpitations.  Last year she was hospitalized with a non-ST elevation MI, treated conservatively. A nuclear scan was low risk without significant ischemia identified and an LVEF of 74%. She is treated with aspirin and Plavix after a stroke.  She has several complaints today. She complains of dry mouth is medication related. She hasn't been as active as in the past. She now has more shortness of breath with physical exertion. She denies orthopnea, PND, or leg swelling. She has been eating more frequently , because at times she feels very weak if she hasn't had anything to eat in 3 or 4 hours. Also has episodes with facial redness and associated weakness. Her daughter has been concerned about these spells. No associated palpitations or lightheadedness.   Past Medical History  Diagnosis Date  . Arthritis   . Blind left eye     Corneal transplant x3 with rejection  . Dysrhythmia   . Varicose veins     both legs and left fooot at arch  . Complication of anesthesia     sensitive to meds, bp can fall  . SVT (supraventricular tachycardia) 12/15/2011    seen by Dr. Caryl Comes  . Helicobacter pylori (H. pylori) 05/22/02    RUT-Positive  . Gastritis   . Diverticulosis   . Stroke   . Irregular heartbeat   . History of stroke 06/14/2014    Past Surgical History  Procedure Laterality Date  . Cholecystectomy    . Tonsillectomy    . Tonsillectomy    . Corneal transplant  right 2009, left 2009    both eyes, 3 in left eye, 1 in right eye  . Cataract surgery  2005    both eyes  . Abdominal hysterectomy   sept 1985    ovaries removed  . Total  knee arthroplasty Left 06/27/2012    Procedure: LEFT TOTAL KNEE ARTHROPLASTY;  Surgeon: Gearlean Alf, MD;  Location: [facility];  Service: Orthopedics;  Laterality: Left;  . Tubal ligation    . Total knee arthroplasty Right 12/12/2012    Procedure: RIGHT TOTAL KNEE ARTHROPLASTY;  Surgeon: Gearlean Alf, MD;  Location: [facility];  Service: Orthopedics;  Laterality: Right;  . Loop recorder implant N/A 01/05/2014    Procedure: LOOP RECORDER IMPLANT;  Surgeon: Coralyn Mark, MD;  Location: [facility]ATH [facility];  Service: Cardiovascular;  Laterality: N/A;    Current Outpatient Prescriptions  Medication Sig Dispense Refill  . amLODipine (NORVASC) 2.5 MG tablet Take 2.5 mg by mouth daily as needed.    Marland Kitchen aspirin EC 81 MG EC tablet Take 1 tablet (81 mg total) by mouth daily.    Marland Kitchen BYSTOLIC 5 MG tablet take 1 tablet by mouth twice a day 60 tablet 6  . clopidogrel (PLAVIX) 75 MG tablet take 1 tablet by mouth once daily 30 tablet 0  . hyoscyamine (LEVSIN SL) 0.125 MG SL tablet Place 1 tablet (0.125 mg total) under the tongue every 4 (four) hours as needed. 30 tablet 0  . Melatonin 1 MG SUBL Place 1 mg under the tongue at bedtime.    . Multiple Vitamin (  MULTI VITAMIN DAILY PO) Take 1 tablet by mouth every morning.    . nitroGLYCERIN (NITROSTAT) 0.4 MG SL tablet Place 1 tablet (0.4 mg total) under the tongue every 5 (five) minutes as needed for chest pain. 25 tablet 3  . prednisoLONE acetate (PRED FORTE) 1 % ophthalmic suspension Place 1 drop into both eyes 2 (two) times daily.     . ranitidine (ZANTAC) 150 MG tablet Take 1 tablet (150 mg total) by mouth at bedtime. 30 tablet 3   No current facility-administered medications for this visit.   Allergies:   Ultram; Buprenex; Keflex; Other; Augmentin; Levofloxacin; Percocet; Sulfa antibiotics; Tape; and Zetia   Social History:  The patient  reports that she has never smoked. She has never used smokeless tobacco. She reports that she does not drink alcohol or use illicit  drugs.   Family History:  The patient's family history includes Cancer in her brother; Heart attack in her brother, brother, brother, brother, and father; Parkinsonism in her brother; Stroke in her mother.    ROS:  Please see the history of present illness.  Otherwise, review of systems is positive for appetite change, DOE, abdominal pain, diarrhea, back pain, fatigue, balance problems.  All other systems are reviewed and negative.    PHYSICAL EXAM: VS:  BP 144/76 mmHg  Pulse 71  Ht 5\' 2"  (1.575 m)  Wt 176 lb 6.4 oz (80.015 kg)  BMI 32.26 kg/m2 , BMI Body mass index is 32.26 kg/(m^2). GEN: Well nourished, well developed, in no acute distress HEENT: normal Neck: no JVD, no masses. No carotid bruits Cardiac: RRR without murmur or gallop                Respiratory:  clear to auscultation bilaterally, normal work of breathing GI: soft, nontender, nondistended, + BS MS: no deformity or atrophy Ext: no pretibial edema, pedal pulses 2+= bilaterally Skin: warm and dry, no rash Neuro:  Strength and sensation are intact Psych: euthymic mood, full affect  EKG:  EKG is not ordered today.  Recent Labs: 11/06/2014: BUN 16; Creatinine, Ser 0.66; Hemoglobin 14.6; Platelets 208.0; Potassium 4.2; Sodium 139   Lipid Panel     Component Value Date/Time   CHOL 158 01/05/2014 0346   TRIG 92 01/05/2014 0346   HDL 49 01/05/2014 0346   CHOLHDL 3.2 01/05/2014 0346   VLDL 18 01/05/2014 0346   LDLCALC 91 01/05/2014 0346      Wt Readings from Last 3 Encounters:  12/13/14 176 lb 6.4 oz (80.015 kg)  11/06/14 175 lb 12.8 oz (79.742 kg)  08/17/14 171 lb 9.6 oz (77.837 kg)     Cardiac Studies Reviewed: 2D Echo 01/21/2014: Left ventricle: The cavity size was normal. Systolic function was normal. Wall motion was normal; there were no regional wall motion abnormalities.  ------------------------------------------------------------------- Aortic valve:  Trileaflet; normal thickness leaflets.  Mobility was not restricted. Doppler: Transvalvular velocity was within the normal range. There was no stenosis. There was mild regurgitation.  ------------------------------------------------------------------- Aorta: Aortic root: The aortic root was normal in size.  ------------------------------------------------------------------- Mitral valve:  Structurally normal valve.  Mobility was not restricted. Doppler: Transvalvular velocity was within the normal range. There was no evidence for stenosis. There was trivial regurgitation.  Peak gradient (D): 5 mm Hg.  ------------------------------------------------------------------- Left atrium: The atrium was normal in size.  ------------------------------------------------------------------- Atrial septum: No defect or patent foramen ovale was identified.  ------------------------------------------------------------------- Right ventricle: The cavity size was normal. Wall thickness was normal. Systolic function was normal.  -------------------------------------------------------------------  Pulmonic valve:  Doppler: Transvalvular velocity was within the normal range. There was no evidence for stenosis.  ------------------------------------------------------------------- Tricuspid valve:  Structurally normal valve.  Doppler: Transvalvular velocity was within the normal range. There was trivial regurgitation.  ------------------------------------------------------------------- Pulmonary artery:  The main pulmonary artery was normal-sized. Systolic pressure was within the normal range.  ------------------------------------------------------------------- Right atrium: The atrium was normal in size.  ------------------------------------------------------------------- Pericardium: There was no pericardial effusion.   ASSESSMENT AND PLAN: 1.   Old MI, no symptoms of angina: low risk Myoview noted and we  continue with conservative measures. She is not having any recurrent angina. She seems to be tolerating dual antiplatelets therapy with aspirin and clopidogrel.  2. Essential hypertension: Blood pressure has been reasonably well controlled. She takes amlodipine on rare occasion. I recommended that she take it if her systolic blood pressure is greater than 170 mmHg.   3. Generalized weakness : Suspect deconditioning is playing a significant role. She also is having spells which are difficult to explain. I asked her daughter to check the patient's vital signs with a blood pressure cuff when the spells occur. She will report findings to [place].   Current medicines are reviewed with the patient today.  The patient does not have concerns regarding medicines.  Labs/ tests ordered today include:  No orders of the defined types were placed in this encounter.    Disposition:   FU 6 months  Signed, [name], MD  12/13/2014 3:33 PM    [facility] [address], [place],   [address] Phone: [phone]; Fax: [phone]

## 2014-12-13 NOTE — Patient Instructions (Addendum)
Medication Instructions:   Your physician recommends that you continue on your current medications as directed. Please refer to the Current Medication list given to you today.    Labwork:  NONE ORDER TODAY    Testing/Procedures: NONE ORDER TODAY   Follow-Up:  Your physician wants you to follow-up in:  [place] will receive a reminder letter in the mail two months in advance. If you don't receive a letter, please call our office to schedule the follow-up appointment.      Any Other Special Instructions Will Be Listed Below (If Applicable).

## 2014-12-17 ENCOUNTER — Telehealth: Payer: Self-pay | Admitting: Internal Medicine

## 2014-12-17 ENCOUNTER — Emergency Department (HOSPITAL_COMMUNITY)
Admission: EM | Admit: 2014-12-17 | Discharge: 2014-12-17 | Disposition: A | Discharge status: Home / Self Care | Payer: Medicare Other | Source: Home / Self Care | Attending: Emergency Medicine | Admitting: Emergency Medicine

## 2014-12-17 ENCOUNTER — Encounter (HOSPITAL_COMMUNITY): Payer: Self-pay | Admitting: Emergency Medicine

## 2014-12-17 ENCOUNTER — Emergency Department (HOSPITAL_COMMUNITY): Payer: Medicare Other

## 2014-12-17 DIAGNOSIS — K5732 Diverticulitis of large intestine without perforation or abscess without bleeding: Secondary | ICD-10-CM | POA: Diagnosis not present

## 2014-12-17 LAB — COMPREHENSIVE METABOLIC PANEL
ALT: 30 U/L (ref 14–54)
AST: 41 U/L (ref 15–41)
Albumin: 3.7 g/dL (ref 3.5–5.0)
Alkaline Phosphatase: 128 U/L — ABNORMAL HIGH (ref 38–126)
Anion gap: 8 (ref 5–15)
BUN: 15 mg/dL (ref 6–20)
CO2: 28 mmol/L (ref 22–32)
Calcium: 9.2 mg/dL (ref 8.9–10.3)
Chloride: 102 mmol/L (ref 101–111)
Creatinine, Ser: 0.66 mg/dL (ref 0.44–1.00)
GFR calc Af Amer: >60 mL/min (ref >60)
GFR calc non Af Amer: >60 mL/min (ref >60)
Glucose, Bld: 122 mg/dL — ABNORMAL HIGH (ref 65–99)
Potassium: 4.2 mmol/L (ref 3.5–5.1)
Sodium: 138 mmol/L (ref 135–145)
Total Bilirubin: 0.6 mg/dL (ref 0.3–1.2)
Total Protein: 6.3 g/dL — ABNORMAL LOW (ref 6.5–8.1)

## 2014-12-17 LAB — CBC
HCT: 42.4 % (ref 36.0–46.0)
Hemoglobin: 13.9 g/dL (ref 12.0–15.0)
MCH: 31.6 pg (ref 26.0–34.0)
MCHC: 32.8 g/dL (ref 30.0–36.0)
MCV: 96.4 fL (ref 78.0–100.0)
Platelets: 182 10*3/uL (ref 150–400)
RBC: 4.4 MIL/uL (ref 3.87–5.11)
RDW: 13 % (ref 11.5–15.5)
WBC: 5.9 10*3/uL (ref 4.0–10.5)

## 2014-12-17 LAB — URINALYSIS, ROUTINE W REFLEX MICROSCOPIC
Bilirubin Urine: NEGATIVE
Glucose, UA: NEGATIVE mg/dL
Hgb urine dipstick: NEGATIVE
Ketones, ur: 15 mg/dL — AB
Leukocytes, UA: NEGATIVE
Nitrite: NEGATIVE
Protein, ur: NEGATIVE mg/dL
Specific Gravity, Urine: 1.007 (ref 1.005–1.030)
Urobilinogen, UA: 0.2 mg/dL (ref 0.0–1.0)
pH: 6 (ref 5.0–8.0)

## 2014-12-17 LAB — LIPASE, BLOOD: Lipase: 22 U/L (ref 22–51)

## 2014-12-17 MED ORDER — CIPROFLOXACIN IN D5W 400 MG/200ML IV SOLN
400.0000 mg | Freq: Two times a day (BID) | INTRAVENOUS | Status: DC
  Administered 2014-12-17: 400 mg via INTRAVENOUS
  Filled 2014-12-17: qty 200

## 2014-12-17 MED ORDER — IOHEXOL 300 MG/ML  SOLN
100.0000 mL | Freq: Once | INTRAMUSCULAR | Status: AC | PRN
  Administered 2014-12-17: 100 mL via INTRAVENOUS

## 2014-12-17 MED ORDER — METRONIDAZOLE 500 MG PO TABS: 500.0000 mg | ORAL_TABLET | Freq: Three times a day (TID) | ORAL | Status: DC

## 2014-12-17 MED ORDER — METRONIDAZOLE IN NACL 5-0.79 MG/ML-% IV SOLN
500.0000 mg | Freq: Once | INTRAVENOUS | Status: AC
  Administered 2014-12-17: 500 mg via INTRAVENOUS
  Filled 2014-12-17: qty 100

## 2014-12-17 MED ORDER — CIPROFLOXACIN HCL 500 MG PO TABS: 500.0000 mg | ORAL_TABLET | Freq: Two times a day (BID) | ORAL | Status: DC

## 2014-12-17 MED ORDER — SODIUM CHLORIDE 0.9 % IV BOLUS (SEPSIS)
1000.0000 mL | Freq: Once | INTRAVENOUS | Status: AC
  Administered 2014-12-17: 1000 mL via INTRAVENOUS

## 2014-12-17 NOTE — Discharge Instructions (Signed)
Recommend a low fiber diet. Take ciprofloxacin and Flagyl as prescribed. You may take Tylenol for pain as needed. Follow-up with your gastroenterologist. Return to the emergency department as needed if symptoms worsen.  Diverticulitis Diverticulitis is inflammation or infection of small pouches in your colon that form when you have a condition called diverticulosis. The pouches in your colon are called diverticula. Your colon, or large intestine, is where water is absorbed and stool is formed. Complications of diverticulitis can include:  Bleeding.  Severe infection.  Severe pain.  Perforation of your colon.  Obstruction of your colon. CAUSES  Diverticulitis is caused by bacteria. Diverticulitis happens when stool becomes trapped in diverticula. This allows bacteria to grow in the diverticula, which can lead to inflammation and infection. RISK FACTORS People with diverticulosis are at risk for diverticulitis. Eating a diet that does not include enough fiber from fruits and vegetables may make diverticulitis more likely to develop. SYMPTOMS  Symptoms of diverticulitis may include:  Abdominal pain and tenderness. The pain is normally located on the left side of the abdomen, but may occur in other areas.  Fever and chills.  Bloating.  Cramping.  Nausea.  Vomiting.  Constipation.  Diarrhea.  Blood in your stool. DIAGNOSIS  Your health care provider will ask you about your medical history and do a physical exam. You may need to have tests done because many medical conditions can cause the same symptoms as diverticulitis. Tests may include:  Blood tests.  Urine tests.  Imaging tests of the abdomen, including X-rays and CT scans. When your condition is under control, your health care provider may recommend that you have a colonoscopy. A colonoscopy can show how severe your diverticula are and whether something else is causing your symptoms. TREATMENT  Most cases of  diverticulitis are mild and can be treated at home. Treatment may include:  Taking over-the-counter pain medicines.  Following a clear liquid diet.  Taking antibiotic medicines by mouth for 7-10 days. More severe cases may be treated at a hospital. Treatment may include:  Not eating or drinking.  Taking prescription pain medicine.  Receiving antibiotic medicines through an IV tube.  Receiving fluids and nutrition through an IV tube.  Surgery. HOME CARE INSTRUCTIONS   Follow your health care provider's instructions carefully.  Follow a full liquid diet or other diet as directed by your health care provider. After your symptoms improve, your health care provider may tell you to change your diet. He or she may recommend you eat a high-fiber diet. Fruits and vegetables are good sources of fiber. Fiber makes it easier to pass stool.  Take fiber supplements or probiotics as directed by your health care provider.  Only take medicines as directed by your health care provider.  Keep all your follow-up appointments. SEEK MEDICAL CARE IF:   Your pain does not improve.  You have a hard time eating food.  Your bowel movements do not return to normal. SEEK IMMEDIATE MEDICAL CARE IF:   Your pain becomes worse.  Your symptoms do not get better.  Your symptoms suddenly get worse.  You have a fever.  You have repeated vomiting.  You have bloody or black, tarry stools. MAKE SURE YOU:   Understand these instructions.  Will watch your condition.  Will get help right away if you are not doing well or get worse. Document Released: 12/17/2004 Document Revised: 03/14/2013 Document Reviewed: 02/01/2013 [facility] Patient Information 2015 [place], [place]. This information is not intended to replace advice given  to you by your health care provider. Make sure you discuss any questions you have with your health care provider.  Low-Fiber Diet Fiber is found in fruits, vegetables, and whole  grains. A low-fiber diet restricts fibrous foods that are not digested in the small intestine. A diet containing about 10-15 grams of fiber per day is considered low fiber. Low-fiber diets may be used to:  Promote healing and rest the bowel during intestinal flare-ups.  Prevent blockage of a partially obstructed or narrowed gastrointestinal tract.  Reduce fecal weight and volume.  Slow the movement of feces. You may be on a low-fiber diet as a transitional diet following surgery, after an injury (trauma), or because of a short (acute) or lifelong (chronic) illness. Your health care provider will determine the length of time you need to stay on this diet.  WHAT DO I NEED TO KNOW ABOUT A LOW-FIBER DIET? Always check the fiber content on the packaging's Nutrition Facts label, especially on foods from the grains list. Ask your dietitian if you have questions about specific foods that are related to your condition, especially if the food is not listed below. In general, a low-fiber food will have less than 2 g of fiber. WHAT FOODS CAN I EAT? Grains All breads and crackers made with white flour. Sweet rolls, doughnuts, waffles, pancakes, [place] toast, bagels. Pretzels, Melba toast, zwieback. Well-cooked cereals, such as cornmeal, farina, or cream cereals. Dry cereals that do not contain whole grains, fruit, or nuts, such as refined corn, wheat, rice, and oat cereals. Potatoes prepared any way without skins, plain pastas and noodles, refined white rice. Use white flour for baking and making sauces. Use allowed list of grains for casseroles, dumplings, and puddings.  Vegetables Strained tomato and vegetable juices. Fresh lettuce, cucumber, spinach. Well-cooked (no skin or pulp) or canned vegetables, such as asparagus, bean sprouts, beets, carrots, green beans, mushrooms, potatoes, pumpkin, spinach, yellow squash, tomato sauce/puree, turnips, yams, and zucchini. Keep servings limited to  cup.  Fruits All  fruit juices except prune juice. Cooked or canned fruits without skin and seeds, such as applesauce, apricots, cherries, fruit cocktail, grapefruit, grapes, mandarin oranges, melons, peaches, pears, pineapple, and plums. Fresh fruits without skin, such as apricots, avocados, bananas, melons, pineapple, nectarines, and peaches. Keep servings limited to  cup or 1 piece.  Meat and Other Protein Sources Ground or well-cooked tender beef, ham, veal, lamb, pork, or poultry. Eggs, plain cheese. Fish, oysters, shrimp, lobster, and other seafood. Liver, organ meats. Smooth nut butters. Dairy All milk products and alternative dairy substitutes, such as soy, rice, almond, and coconut, not containing added whole nuts, seeds, or added fruit. Beverages Decaf coffee, fruit, and vegetable juices or smoothies (small amounts, with no pulp or skins, and with fruits from allowed list), sports drinks, herbal tea. Condiments Ketchup, mustard, vinegar, cream sauce, cheese sauce, cocoa powder. Spices in moderation, such as allspice, basil, bay leaves, celery powder or leaves, cinnamon, cumin powder, curry powder, ginger, mace, marjoram, onion or garlic powder, oregano, paprika, parsley flakes, ground pepper, rosemary, sage, savory, tarragon, thyme, and turmeric. Sweets and Desserts Plain cakes and cookies, pie made with allowed fruit, pudding, custard, cream pie. Gelatin, fruit, ice, sherbet, frozen ice pops. Ice cream, ice milk without nuts. Plain hard candy, honey, jelly, molasses, syrup, sugar, chocolate syrup, gumdrops, marshmallows. Limit overall sugar intake.  Fats and Oil Margarine, butter, cream, mayonnaise, salad oils, plain salad dressings made from allowed foods. Choose healthy fats such as olive oil, canola  oil, and omega-3 fatty acids (such as found in salmon or tuna) when possible.  Other Bouillon, broth, or cream soups made from allowed foods. Any strained soup. Casseroles or mixed dishes made with allowed  foods. The items listed above may not be a complete list of recommended foods or beverages. Contact your dietitian for more options.  WHAT FOODS ARE NOT RECOMMENDED? Grains All whole wheat and whole grain breads and crackers. Multigrains, rye, bran seeds, nuts, or coconut. Cereals containing whole grains, multigrains, bran, coconut, nuts, raisins. Cooked or dry oatmeal, steel-cut oats. Coarse wheat cereals, granola. Cereals advertised as high fiber. Potato skins. Whole grain pasta, wild or brown rice. Popcorn. Coconut flour. Bran, buckwheat, corn bread, multigrains, rye, wheat germ.  Vegetables Fresh, cooked or canned vegetables, such as artichokes, asparagus, beet greens, broccoli, Brussels sprouts, cabbage, celery, cauliflower, corn, eggplant, kale, legumes or beans, okra, peas, and tomatoes. Avoid large servings of any vegetables, especially raw vegetables.  Fruits Fresh fruits, such as apples with or without skin, berries, cherries, figs, grapes, grapefruit, guavas, kiwis, mangoes, oranges, papayas, pears, persimmons, pineapple, and pomegranate. Prune juice and juices with pulp, stewed or dried prunes. Dried fruits, dates, raisins. Fruit seeds or skins. Avoid large servings of all fresh fruits. Meats and Other Protein Sources Tough, fibrous meats with gristle. Chunky nut butter. Cheese made with seeds, nuts, or other foods not recommended. Nuts, seeds, legumes (beans, including baked beans), dried peas, beans, lentils.  Dairy Yogurt or cheese that contains nuts, seeds, or added fruit.  Beverages Fruit juices with high pulp, prune juice. Caffeinated coffee and teas.  Condiments Coconut, maple syrup, pickles, olives. Sweets and Desserts Desserts, cookies, or candies that contain nuts or coconut, chunky peanut butter, dried fruits. Jams, preserves with seeds, marmalade. Large amounts of sugar and sweets. Any other dessert made with fruits from the not recommended list.  Other Soups made from  vegetables that are not recommended or that contain other foods not recommended.  The items listed above may not be a complete list of foods and beverages to avoid. Contact your dietitian for more information. Document Released: 08/29/2001 Document Revised: 03/14/2013 Document Reviewed: 01/30/2013 [facility] Patient Information 2015 [place], [place]. This information is not intended to replace advice given to you by your health care provider. Make sure you discuss any questions you have with your health care provider.

## 2014-12-17 NOTE — Progress Notes (Signed)
ANTIBIOTIC CONSULT NOTE - INITIAL  Pharmacy Consult for ciprofloxacin Indication: Intra-abdominal Infection, diverticulitis  Allergies  Allergen Reactions  . Ultram [Tramadol] Hives  . Buprenex [Buprenorphine Hcl] Other (See Comments)    Heart raced  . Keflex [Cephalexin] Diarrhea  . Other Swelling    Yellow eye dye that is used to check retina. Caused swelling of tongue.  . Augmentin [Amoxicillin-Pot Clavulanate] Nausea And Vomiting  . Levofloxacin Other (See Comments)    levaquin unknown reaction  . Percocet [Oxycodone-Acetaminophen] Nausea And Vomiting and Other (See Comments)    "felt drained"  . Sulfa Antibiotics Other (See Comments)    unknown  . Tape Other (See Comments)    Adhesive tape causes skin to pull off  . Zetia [Ezetimibe] Other (See Comments)    unknown    Patient Measurements: weight 80 kg, height 62 inches    Vital Signs: Temp: 98.2 F (36.8 C) (09/26 1544) Temp Source: Oral (09/26 1544) BP: 186/92 mmHg (09/26 1737) Pulse Rate: 67 (09/26 1737) Intake/Output from previous day:   Intake/Output from this shift:    Labs:  Recent Labs  12/17/14 1350  WBC 5.9  HGB 13.9  PLT 182  CREATININE 0.66   Estimated Creatinine Clearance: 48.6 mL/min (by C-G formula based on Cr of 0.66). No results for input(s): VANCOTROUGH, VANCOPEAK, VANCORANDOM, GENTTROUGH, GENTPEAK, GENTRANDOM, TOBRATROUGH, TOBRAPEAK, TOBRARND, AMIKACINPEAK, AMIKACINTROU, AMIKACIN in the last 72 hours.   Microbiology: No results found for this or any previous visit (from the past 720 hour(s)).  Medical History: Past Medical History  Diagnosis Date  . Arthritis   . Blind left eye     Corneal transplant x3 with rejection  . Dysrhythmia   . Varicose veins     both legs and left fooot at arch  . Complication of anesthesia     sensitive to meds, bp can fall  . SVT (supraventricular tachycardia) 12/15/2011    seen by Dr. Caryl Comes  . Helicobacter pylori (H. pylori) 05/22/02   RUT-Positive  . Gastritis   . Diverticulosis   . Stroke   . Irregular heartbeat   . History of stroke 06/14/2014    Assessment: Patient's an 81 y.o F with hx diverticulitis who presented to the ED on 9/26 with c/o LLQ pain for about a week. She stated that she called her GI doctor's office on 9/23 and they instructed her to start taking her left over flagyl and cipro from a prior infection.  CT abd on 9/26 showed diverticulitis, no abscess. To resume cipro for intra-abdominal infection.  Of note, patient reported that her last dose of cipro was taken between 8a -9a and flagyl at noon on 9/26 PTA.   Plan:  - cipro 400mg  IV q12h  Dia Sitter P 12/17/2014,7:08 PM

## 2014-12-17 NOTE — ED Notes (Signed)
Pt comes in today with a c/o LLQ abdominal pain. Pt states that she has had pain for about 10 days to 2 weeks. Pt states that she called [facility] today and they advised her to come to the ED to be evaluated. Pt has a hx of diverticulitis.

## 2014-12-17 NOTE — ED Notes (Signed)
Provided a [place] sandwich and ginger ale.

## 2014-12-17 NOTE — ED Provider Notes (Signed)
CSN: [id number]     Arrival date & time 12/17/14  1304 History   First MD Initiated Contact with Patient 12/17/14 1655     No chief complaint on file.    (Consider location/radiation/quality/duration/timing/severity/associated sxs/prior Treatment) HPI Comments: 81 year old female with a history of arthritis, SVT, diverticulosis, and stroke presents to the emergency department for evaluation of left lower quadrant abdominal pain. Patient states that symptoms began 3 days ago and have been migrating between her left lower quadrant and her suprapubic abdomen. Patient reports looser brown stool changing over to passing of clear mucus 2 days ago. Patient describes a sharp, waxing and waning, intermittent pain in her abdomen. She started taking ciprofloxacin and Flagyl yesterday which she had left over from a previous prescription given to her by her gastroenterologist, Dr. Maurene Capes. She states that this improved her symptoms temporarily, but pain worsened again today. Patient states that her stool is now makes between brown stool and clear mucus. She denies any fever, abdominal distention, nausea, vomiting, hematochezia, or melena. No urinary symptoms. Abdominal surgical history significant for cholecystectomy and total hysterectomy.   PCP - Dr. Inda Merlin  The history is provided by the patient. No language interpreter was used.    Past Medical History  Diagnosis Date  . Arthritis   . Blind left eye     Corneal transplant x3 with rejection  . Dysrhythmia   . Varicose veins     both legs and left fooot at arch  . Complication of anesthesia     sensitive to meds, bp can fall  . SVT (supraventricular tachycardia) 12/15/2011    seen by Dr. Caryl Comes  . Helicobacter pylori (H. pylori) 05/22/02    RUT-Positive  . Gastritis   . Diverticulosis   . Stroke   . Irregular heartbeat   . History of stroke 06/14/2014   Past Surgical History  Procedure Laterality Date  . Cholecystectomy    . Tonsillectomy    .  Tonsillectomy    . Corneal transplant  right 2009, left 2009    both eyes, 3 in left eye, 1 in right eye  . Cataract surgery  2005    both eyes  . Abdominal hysterectomy   sept 1985    ovaries removed  . Total knee arthroplasty Left 06/27/2012    Procedure: LEFT TOTAL KNEE ARTHROPLASTY;  Surgeon: Gearlean Alf, MD;  Location: [facility];  Service: Orthopedics;  Laterality: Left;  . Tubal ligation    . Total knee arthroplasty Right 12/12/2012    Procedure: RIGHT TOTAL KNEE ARTHROPLASTY;  Surgeon: Gearlean Alf, MD;  Location: [facility];  Service: Orthopedics;  Laterality: Right;  . Loop recorder implant N/A 01/05/2014    Procedure: LOOP RECORDER IMPLANT;  Surgeon: Coralyn Mark, MD;  Location: [facility];  Service: Cardiovascular;  Laterality: N/A;   Family History  Problem Relation Age of Onset  . Stroke Mother   . Heart attack Father   . Heart attack Brother   . Cancer Brother   . Heart attack Brother   . Heart attack Brother   . Heart attack Brother   . Parkinsonism Brother    Social History  Substance Use Topics  . Smoking status: Never Smoker   . Smokeless tobacco: Never Used  . Alcohol Use: No   OB History    No data available      Review of Systems  Constitutional: Negative for fever.  Respiratory: Negative for shortness of breath.   Cardiovascular: Negative  for chest pain.  Gastrointestinal: Positive for abdominal pain and diarrhea. Negative for nausea, vomiting and blood in stool.  Genitourinary: Negative for dysuria.  All other systems reviewed and are negative.   Allergies  Ultram; Buprenex; Keflex; Other; Augmentin; Levofloxacin; Percocet; Sulfa antibiotics; Tape; and Zetia  Home Medications   Prior to Admission medications   Medication Sig Start Date End Date Taking? Authorizing Provider  amLODipine (NORVASC) 2.5 MG tablet Take 2.5 mg by mouth daily as needed.   Yes Historical Provider, MD  aspirin EC 81 MG EC tablet Take 1 tablet (81 mg total) by mouth  daily. 01/22/14  Yes Geradine Girt, DO  BYSTOLIC 5 MG tablet take 1 tablet by mouth twice a day 07/17/14  Yes [name], MD  clopidogrel (PLAVIX) 75 MG tablet take 1 tablet by mouth once daily 12/10/14  Yes [name], MD  hyoscyamine (LEVSIN SL) 0.125 MG SL tablet Place 1 tablet (0.125 mg total) under the tongue every 4 (four) hours as needed. 11/06/14  Yes Lafayette Dragon, MD  Melatonin 1 MG SUBL Place 1 mg under the tongue at bedtime.   Yes Historical Provider, MD  Multiple Vitamin (MULTI VITAMIN DAILY PO) Take 1 tablet by mouth every morning.   Yes Historical Provider, MD  nitroGLYCERIN (NITROSTAT) 0.4 MG SL tablet Place 1 tablet (0.4 mg total) under the tongue every 5 (five) minutes as needed for chest pain. 02/05/14  Yes Imogene Burn, PA-C  prednisoLONE acetate (PRED FORTE) 1 % ophthalmic suspension Place 1 drop into both eyes 2 (two) times daily.    Yes Historical Provider, MD  ranitidine (ZANTAC) 150 MG tablet Take 1 tablet (150 mg total) by mouth at bedtime. 11/07/14  Yes Lafayette Dragon, MD  ciprofloxacin (CIPRO) 500 MG tablet Take 1 tablet (500 mg total) by mouth 2 (two) times daily. 28 12/17/14   Antonietta Breach, PA-C  metroNIDAZOLE (FLAGYL) 500 MG tablet Take 1 tablet (500 mg total) by mouth 3 (three) times daily. 12/17/14   Antonietta Breach, PA-C   BP 158/109 mmHg  Pulse 68  Temp(Src) 99.1 F (37.3 C) (Oral)  Resp 18  SpO2 99%   Physical Exam  Constitutional: She is oriented to person, place, and time. She appears well-developed and well-nourished. No distress.  Nontoxic/nonseptic appearing  HENT:  Head: Normocephalic and atraumatic.  Eyes: Conjunctivae and EOM are normal. No scleral icterus.  Neck: Normal range of motion.  Cardiovascular: Normal rate, regular rhythm and intact distal pulses.   Pulmonary/Chest: Effort normal and breath sounds normal. No respiratory distress. She has no wheezes. She has no rales.  Respirations even and unlabored. Lungs clear.  Abdominal: Soft. She  exhibits no distension. There is tenderness. There is no rebound and no guarding.  Soft abdomen with focal tenderness in the suprapubic abdomen as well as the left lower quadrant. There is mild local tenderness in the right upper quadrant without a positive Murphy sign. Patient does have a history of cholecystectomy. No involuntary guarding or masses. No peritoneal signs.  Musculoskeletal: Normal range of motion.  Neurological: She is alert and oriented to person, place, and time. She exhibits normal muscle tone. Coordination normal.  Skin: Skin is warm and dry. No rash noted. She is not diaphoretic. No erythema. No pallor.  Psychiatric: She has a normal mood and affect. Her behavior is normal.  Nursing note and vitals reviewed.   ED Course  Procedures (including critical care time) Labs Review Labs Reviewed  COMPREHENSIVE METABOLIC PANEL -  Abnormal; Notable for the following:    Glucose, Bld 122 (*)    Total Protein 6.3 (*)    Alkaline Phosphatase 128 (*)    All other components within normal limits  URINALYSIS, ROUTINE W REFLEX MICROSCOPIC (NOT AT [facility]) - Abnormal; Notable for the following:    Ketones, ur 15 (*)    All other components within normal limits  LIPASE, BLOOD  CBC    Imaging Review Ct Abdomen Pelvis W Contrast  12/17/2014   CLINICAL DATA:  Left lower quadrant abdomen pain for 2 weeks  EXAM: CT ABDOMEN AND PELVIS WITH CONTRAST  TECHNIQUE: Multidetector CT imaging of the abdomen and pelvis was performed using the standard protocol following bolus administration of intravenous contrast.  CONTRAST:  171mL OMNIPAQUE IOHEXOL 300 MG/ML  SOLN  COMPARISON:  November 08, 2014  FINDINGS: The liver, spleen, pancreas, adrenal glands and kidneys are normal. There is mild dilatation of the right ureter without focal obstructing stone identified. Patient is status post prior cholecystectomy. There is atherosclerosis of the abdominal aorta without aneurysmal dilatation. There is no abdominal  lymphadenopathy.  There is no small bowel obstruction. The appendix is normal. There is stranding and inflammation surrounding the rectal sigmoid junction with thickening of the rectal sigmoid colonic wall. There is diverticulosis of colon.  Images of the pelvis demonstrate fluid-filled bladder without abnormality. The patient is status post prior cholecystectomy. The lung bases demonstrate no focal pneumonia or pleural effusion. Degenerative joint changes of the spine are noted.  IMPRESSION: Diverticulitis in the rectal sigmoid junction. No abscess is identified.   Electronically Signed   By: Abelardo Diesel M.D.   On: 12/17/2014 18:56     I have personally reviewed and evaluated these images and lab results as part of my medical decision-making.   EKG Interpretation None      MDM   Final diagnoses:  Diverticulitis of large intestine without perforation or abscess without bleeding    81 year old female presents to the emergency department for evaluation of abdominal pain. Patient is afebrile and hemodynamically stable. She has no leukocytosis today. CT obtained which reveals diverticulitis of the rectal sigmoid junction without abscess or perforation. Patient has been tolerating oral food and fluids without difficulty. IV Cipro and Flagyl initiated. Will discharge with course of same as well as instruction to follow-up with her gastroenterologist, Dr. Olevia Perches. Return precautions discussed and provided. Patient and family agreeable to plan with no unaddressed concerns. Patient discharged in good condition.   Filed Vitals:   12/17/14 1317 12/17/14 1544 12/17/14 1737 12/17/14 1949  BP: 135/56 139/62 186/92 158/109  Pulse:  79 67 68  Temp:  98.2 F (36.8 C)  99.1 F (37.3 C)  TempSrc:  Oral  Oral  Resp:  18 16 18   SpO2:  96% 100% 99%     Antonietta Breach, PA-C 12/17/14 2100  Virgel Manifold, MD 12/27/14 [phone]

## 2014-12-17 NOTE — ED Notes (Signed)
Patient denies having any allergic reaction. Will start Cipro.

## 2014-12-17 NOTE — Telephone Encounter (Signed)
Spoke with patient and gave her recommendations. 

## 2014-12-17 NOTE — Telephone Encounter (Signed)
Former [facility] patient hx diverticulitis. Started having left side abdominal pain on Friday. States the pain is sharp and comes and goes. Also, started having mucous in stool and liquid stools.Denies fever or nausea.  She had Cipro and Flagyl from the past and started it on Saturday. States she is not any better. She would like the female doctor. No appointments with extenders this week. DOD- Dr. Henrene Pastor. Please, advise.

## 2014-12-17 NOTE — Telephone Encounter (Signed)
I cannot evaluate over the phone. She needs to see her PCP, urgent care, or the ER for evaluation. She can establish with Dr. Juliann Mule

## 2014-12-17 NOTE — ED Notes (Signed)
Patient c/o LLQ pain onset 7 days ago transitioning to lower medial abdominal pain on Friday, pain worsened significantly on Saturday, and mucus stools onset Friday night. Pt took home Abx without relief. Pt states her diverticulitis flare-ups usually involve LLQ pain only, medial pain is new.

## 2014-12-19 ENCOUNTER — Encounter (HOSPITAL_COMMUNITY): Payer: Self-pay | Admitting: Emergency Medicine

## 2014-12-19 ENCOUNTER — Inpatient Hospital Stay (HOSPITAL_COMMUNITY)
Admission: EM | Admit: 2014-12-19 | Discharge: 2014-12-23 | DRG: 392 | Disposition: A | Discharge status: Home / Self Care | Payer: Medicare Other | Attending: Internal Medicine | Admitting: Internal Medicine

## 2014-12-19 ENCOUNTER — Telehealth: Payer: Self-pay | Admitting: Internal Medicine

## 2014-12-19 DIAGNOSIS — Z79899 Other long term (current) drug therapy: Secondary | ICD-10-CM

## 2014-12-19 DIAGNOSIS — I214 Non-ST elevation (NSTEMI) myocardial infarction: Secondary | ICD-10-CM

## 2014-12-19 DIAGNOSIS — Z8673 Personal history of transient ischemic attack (TIA), and cerebral infarction without residual deficits: Secondary | ICD-10-CM | POA: Diagnosis not present

## 2014-12-19 DIAGNOSIS — Z947 Corneal transplant status: Secondary | ICD-10-CM

## 2014-12-19 DIAGNOSIS — Z888 Allergy status to other drugs, medicaments and biological substances status: Secondary | ICD-10-CM | POA: Diagnosis not present

## 2014-12-19 DIAGNOSIS — Z882 Allergy status to sulfonamides status: Secondary | ICD-10-CM

## 2014-12-19 DIAGNOSIS — M179 Osteoarthritis of knee, unspecified: Secondary | ICD-10-CM | POA: Diagnosis not present

## 2014-12-19 DIAGNOSIS — Z7952 Long term (current) use of systemic steroids: Secondary | ICD-10-CM | POA: Diagnosis not present

## 2014-12-19 DIAGNOSIS — I8390 Asymptomatic varicose veins of unspecified lower extremity: Secondary | ICD-10-CM | POA: Diagnosis present

## 2014-12-19 DIAGNOSIS — Z7982 Long term (current) use of aspirin: Secondary | ICD-10-CM

## 2014-12-19 DIAGNOSIS — Z9049 Acquired absence of other specified parts of digestive tract: Secondary | ICD-10-CM | POA: Diagnosis not present

## 2014-12-19 DIAGNOSIS — R110 Nausea: Secondary | ICD-10-CM | POA: Diagnosis present

## 2014-12-19 DIAGNOSIS — M1710 Unilateral primary osteoarthritis, unspecified knee: Secondary | ICD-10-CM | POA: Diagnosis present

## 2014-12-19 DIAGNOSIS — N390 Urinary tract infection, site not specified: Secondary | ICD-10-CM | POA: Diagnosis present

## 2014-12-19 DIAGNOSIS — I252 Old myocardial infarction: Secondary | ICD-10-CM

## 2014-12-19 DIAGNOSIS — Z91048 Other nonmedicinal substance allergy status: Secondary | ICD-10-CM

## 2014-12-19 DIAGNOSIS — Z7902 Long term (current) use of antithrombotics/antiplatelets: Secondary | ICD-10-CM | POA: Diagnosis not present

## 2014-12-19 DIAGNOSIS — K5792 Diverticulitis of intestine, part unspecified, without perforation or abscess without bleeding: Secondary | ICD-10-CM | POA: Diagnosis present

## 2014-12-19 DIAGNOSIS — Z96653 Presence of artificial knee joint, bilateral: Secondary | ICD-10-CM | POA: Diagnosis present

## 2014-12-19 DIAGNOSIS — Z881 Allergy status to other antibiotic agents status: Secondary | ICD-10-CM | POA: Diagnosis not present

## 2014-12-19 DIAGNOSIS — K5732 Diverticulitis of large intestine without perforation or abscess without bleeding: Secondary | ICD-10-CM | POA: Diagnosis not present

## 2014-12-19 DIAGNOSIS — K219 Gastro-esophageal reflux disease without esophagitis: Secondary | ICD-10-CM | POA: Diagnosis present

## 2014-12-19 DIAGNOSIS — Z96651 Presence of right artificial knee joint: Secondary | ICD-10-CM | POA: Diagnosis present

## 2014-12-19 DIAGNOSIS — R1032 Left lower quadrant pain: Secondary | ICD-10-CM | POA: Diagnosis present

## 2014-12-19 DIAGNOSIS — I2510 Atherosclerotic heart disease of native coronary artery without angina pectoris: Secondary | ICD-10-CM | POA: Diagnosis present

## 2014-12-19 DIAGNOSIS — R300 Dysuria: Secondary | ICD-10-CM | POA: Diagnosis present

## 2014-12-19 DIAGNOSIS — Z885 Allergy status to narcotic agent status: Secondary | ICD-10-CM | POA: Diagnosis not present

## 2014-12-19 DIAGNOSIS — K5730 Diverticulosis of large intestine without perforation or abscess without bleeding: Secondary | ICD-10-CM | POA: Diagnosis not present

## 2014-12-19 DIAGNOSIS — H5442 Blindness, left eye, normal vision right eye: Secondary | ICD-10-CM | POA: Diagnosis present

## 2014-12-19 DIAGNOSIS — K5731 Diverticulosis of large intestine without perforation or abscess with bleeding: Secondary | ICD-10-CM | POA: Diagnosis not present

## 2014-12-19 DIAGNOSIS — K5712 Diverticulitis of small intestine without perforation or abscess without bleeding: Secondary | ICD-10-CM | POA: Diagnosis not present

## 2014-12-19 DIAGNOSIS — K2970 Gastritis, unspecified, without bleeding: Secondary | ICD-10-CM | POA: Diagnosis present

## 2014-12-19 DIAGNOSIS — I10 Essential (primary) hypertension: Secondary | ICD-10-CM | POA: Diagnosis not present

## 2014-12-19 LAB — COMPREHENSIVE METABOLIC PANEL
ALBUMIN: 3.9 g/dL (ref 3.5–5.0)
ALK PHOS: 124 U/L (ref 38–126)
ALT: 34 U/L (ref 14–54)
AST: 45 U/L — AB (ref 15–41)
Anion gap: 7 (ref 5–15)
BILIRUBIN TOTAL: 0.3 mg/dL (ref 0.3–1.2)
BUN: 17 mg/dL (ref 6–20)
CO2: 28 mmol/L (ref 22–32)
CREATININE: 0.75 mg/dL (ref 0.44–1.00)
Calcium: 9.3 mg/dL (ref 8.9–10.3)
Chloride: 102 mmol/L (ref 101–111)
GFR calc Af Amer: >60 mL/min (ref >60)
GFR calc non Af Amer: >60 mL/min (ref >60)
GLUCOSE: 168 mg/dL — AB (ref 65–99)
POTASSIUM: 4.4 mmol/L (ref 3.5–5.1)
Sodium: 137 mmol/L (ref 135–145)
TOTAL PROTEIN: 6.5 g/dL (ref 6.5–8.1)

## 2014-12-19 LAB — CBC
HEMATOCRIT: 42.8 % (ref 36.0–46.0)
Hemoglobin: 14 g/dL (ref 12.0–15.0)
MCH: 31.3 pg (ref 26.0–34.0)
MCHC: 32.7 g/dL (ref 30.0–36.0)
MCV: 95.5 fL (ref 78.0–100.0)
PLATELETS: 189 10*3/uL (ref 150–400)
RBC: 4.48 MIL/uL (ref 3.87–5.11)
RDW: 12.9 % (ref 11.5–15.5)
WBC: 6.9 10*3/uL (ref 4.0–10.5)

## 2014-12-19 LAB — URINALYSIS, ROUTINE W REFLEX MICROSCOPIC
BILIRUBIN URINE: NEGATIVE
Glucose, UA: NEGATIVE mg/dL
HGB URINE DIPSTICK: NEGATIVE
Ketones, ur: NEGATIVE mg/dL
Nitrite: NEGATIVE
PH: 6 (ref 5.0–8.0)
Protein, ur: NEGATIVE mg/dL
SPECIFIC GRAVITY, URINE: 1.011 (ref 1.005–1.030)
UROBILINOGEN UA: 0.2 mg/dL (ref 0.0–1.0)

## 2014-12-19 LAB — URINE MICROSCOPIC-ADD ON

## 2014-12-19 MED ORDER — MORPHINE SULFATE (PF) 2 MG/ML IV SOLN
2.0000 mg | INTRAVENOUS | Status: DC | PRN
  Administered 2014-12-22: 2 mg via INTRAVENOUS
  Filled 2014-12-19: qty 1

## 2014-12-19 MED ORDER — SODIUM CHLORIDE 0.9 % IJ SOLN
3.0000 mL | Freq: Two times a day (BID) | INTRAMUSCULAR | Status: DC
Start: 2014-12-19 — End: 2014-12-23
  Administered 2014-12-20 (×2): 3 mL via INTRAVENOUS

## 2014-12-19 MED ORDER — METRONIDAZOLE IN NACL 5-0.79 MG/ML-% IV SOLN
500.0000 mg | Freq: Once | INTRAVENOUS | Status: AC
  Administered 2014-12-19: 500 mg via INTRAVENOUS
  Filled 2014-12-19: qty 100

## 2014-12-19 MED ORDER — CIPROFLOXACIN IN D5W 400 MG/200ML IV SOLN
400.0000 mg | INTRAVENOUS | Status: DC
  Administered 2014-12-20 – 2014-12-21 (×2): 400 mg via INTRAVENOUS
  Filled 2014-12-19 (×3): qty 200

## 2014-12-19 MED ORDER — ONDANSETRON HCL 4 MG/2ML IJ SOLN: 4.0000 mg | Freq: Three times a day (TID) | INTRAMUSCULAR | Status: DC | PRN

## 2014-12-19 MED ORDER — CLOPIDOGREL BISULFATE 75 MG PO TABS
75.0000 mg | ORAL_TABLET | Freq: Every day | ORAL | Status: DC
Start: 2014-12-20 — End: 2014-12-23
  Administered 2014-12-20 – 2014-12-23 (×4): 75 mg via ORAL
  Filled 2014-12-19 (×6): qty 1

## 2014-12-19 MED ORDER — FAMOTIDINE IN NACL 20-0.9 MG/50ML-% IV SOLN
20.0000 mg | INTRAVENOUS | Status: DC
  Administered 2014-12-20 (×2): 20 mg via INTRAVENOUS
  Filled 2014-12-19 (×2): qty 50

## 2014-12-19 MED ORDER — METRONIDAZOLE IN NACL 5-0.79 MG/ML-% IV SOLN
500.0000 mg | Freq: Three times a day (TID) | INTRAVENOUS | Status: DC
  Administered 2014-12-20 – 2014-12-23 (×10): 500 mg via INTRAVENOUS
  Filled 2014-12-19 (×11): qty 100

## 2014-12-19 MED ORDER — ASPIRIN EC 81 MG PO TBEC
81.0000 mg | DELAYED_RELEASE_TABLET | Freq: Every day | ORAL | Status: DC
  Administered 2014-12-20 – 2014-12-23 (×4): 81 mg via ORAL
  Filled 2014-12-19 (×4): qty 1

## 2014-12-19 MED ORDER — CIPROFLOXACIN IN D5W 400 MG/200ML IV SOLN
400.0000 mg | Freq: Once | INTRAVENOUS | Status: AC
  Administered 2014-12-19: 400 mg via INTRAVENOUS
  Filled 2014-12-19: qty 200

## 2014-12-19 MED ORDER — NEBIVOLOL HCL 5 MG PO TABS
5.0000 mg | ORAL_TABLET | Freq: Two times a day (BID) | ORAL | Status: DC
  Administered 2014-12-20 – 2014-12-23 (×8): 5 mg via ORAL
  Filled 2014-12-19 (×9): qty 1

## 2014-12-19 MED ORDER — MELATONIN 1 MG SL SUBL: 1.0000 mg | SUBLINGUAL_TABLET | Freq: Every day | SUBLINGUAL | Status: DC

## 2014-12-19 MED ORDER — HYDRALAZINE HCL 20 MG/ML IJ SOLN: 5.0000 mg | INTRAMUSCULAR | Status: DC | PRN

## 2014-12-19 MED ORDER — PREDNISOLONE ACETATE 1 % OP SUSP
1.0000 [drp] | Freq: Two times a day (BID) | OPHTHALMIC | Status: DC
  Administered 2014-12-20 – 2014-12-23 (×8): 1 [drp] via OPHTHALMIC
  Filled 2014-12-19: qty 1

## 2014-12-19 MED ORDER — AMLODIPINE BESYLATE 2.5 MG PO TABS
2.5000 mg | ORAL_TABLET | Freq: Every day | ORAL | Status: DC
  Administered 2014-12-21: 2.5 mg via ORAL
  Filled 2014-12-19 (×3): qty 1

## 2014-12-19 MED ORDER — VITAMIN D3 25 MCG (1000 UNIT) PO TABS
1000.0000 [IU] | ORAL_TABLET | Freq: Every day | ORAL | Status: DC
  Administered 2014-12-20 – 2014-12-23 (×4): 1000 [IU] via ORAL
  Filled 2014-12-19 (×4): qty 1

## 2014-12-19 MED ORDER — NITROGLYCERIN 0.4 MG SL SUBL: 0.4000 mg | SUBLINGUAL_TABLET | SUBLINGUAL | Status: DC | PRN

## 2014-12-19 MED ORDER — SODIUM CHLORIDE 0.9 % IV SOLN
INTRAVENOUS | Status: DC
  Administered 2014-12-20 – 2014-12-23 (×4): via INTRAVENOUS

## 2014-12-19 MED ORDER — ADULT MULTIVITAMIN W/MINERALS CH
ORAL_TABLET | Freq: Every morning | ORAL | Status: DC
  Administered 2014-12-20 – 2014-12-23 (×4): 1 via ORAL
  Filled 2014-12-19 (×4): qty 1

## 2014-12-19 NOTE — ED Notes (Signed)
Per pt, states she has been on antibiotics for diverticulitis for 5 days-not getting any better-states blood in stool this am

## 2014-12-19 NOTE — ED Notes (Signed)
Bed: WA07 Expected date:  Expected time:  Means of arrival:  Comments: Hold for triage 2 

## 2014-12-19 NOTE — Telephone Encounter (Signed)
Spoke with patient's daughter and she states patient went to ED on Monday. CT scan revealed diverticulitis. Patient was given IV Cipro and Flagyl and discharged with oral Cipro and Flagyl. She is taking this. She is continuing to have terrible abdominal pain. Former [employer] pt. No OV with extender this week. DOD-[facility]. Please, advise.

## 2014-12-19 NOTE — H&P (Addendum)
[facility] History and Physical  [name] NFA:[id number] DOB: 08-06-1924 DOA: 12/19/2014  Referring physician: ED physician PCP: Marjorie Smolder, MD  Specialists:   Chief Complaint: Abdominal pain  HPI: [name] [name] is a 81 y.o. female with PMH of left eye blindness, post listhesis of corneal transplantation 2009, GERD, right is, while he), SVT, gastritis, recurrent diverticulitis, stroke, CAD, MI, who presents with abdominal pain.  Patient reports that she has been having abdominal pain for 2 weeks. The abdominal pain is located in the left lower quadrant, severe, nonradiating. She also has bloody and mucoid stools. She does not have fever or chills. She was started with Flagyl on 9/23 and Cipro on 9/24 by her GI doctor. She had CT abdomen/pelvis on 12/17/14 which showed diverticulitis in the rectal sigmoid junction without abscess. She has been compliant to her antibiotics, but her abdominal pain has not improved at all. She also has mildly increased urinary frequency, but no dysuria or burning. She feels pressure in her suprapubic area. Patient does not have chest pain, shortness of breath, rashes, unilateral weakness.  In ED, patient was found to have positive urinalysis with large amount for leukocyte, WBC 6.9, temperature normal, no tachycardia, electrolytes okay. Patient's admitted to inpatient for further interventional treatment. GI was consulted by ED.  Where does patient live?   At home  Can patient participate in ADLs?  Little  Review of Systems:   General: no fevers, chills, no changes in body weight, has poor appetite, has fatigue HEENT: no blurry vision, hearing changes or sore throat Pulm: no dyspnea, coughing, wheezing CV: no chest pain, palpitations Abd: has nausea, no vomiting, has abdominal pain, diarrhea and bloody stool, no constipation GU: no dysuria, burning on urination, has increased urinary frequency, no hematuria  Ext: no leg edema Neuro: no  unilateral weakness, numbness, or tingling, no vision change or hearing loss Skin: no rash MSK: No muscle spasm, no deformity, no limitation of range of movement in spin Heme: No easy bruising.  Travel history: No recent long distant travel.  Allergy:  Allergies  Allergen Reactions  . Ultram [Tramadol] Hives  . Buprenex [Buprenorphine Hcl] Other (See Comments)    Heart raced  . Keflex [Cephalexin] Diarrhea  . Other Swelling    Yellow eye dye that is used to check retina. Caused swelling of tongue.  . Augmentin [Amoxicillin-Pot Clavulanate] Nausea And Vomiting  . Levofloxacin Other (See Comments)    levaquin unknown reaction  . Percocet [Oxycodone-Acetaminophen] Nausea And Vomiting and Other (See Comments)    "felt drained"  . Sulfa Antibiotics Other (See Comments)    unknown  . Tape Other (See Comments)    Adhesive tape causes skin to pull off  . Zetia [Ezetimibe] Other (See Comments)    unknown    Past Medical History  Diagnosis Date  . Arthritis   . Blind left eye     Corneal transplant x3 with rejection  . Dysrhythmia   . Varicose veins     both legs and left fooot at arch  . Complication of anesthesia     sensitive to meds, bp can fall  . SVT (supraventricular tachycardia) 12/15/2011    seen by Dr. Caryl Comes  . Helicobacter pylori (H. pylori) 05/22/02    RUT-Positive  . Gastritis   . Diverticulosis   . Stroke   . Irregular heartbeat   . History of stroke 06/14/2014    Past Surgical History  Procedure Laterality Date  . Cholecystectomy    .  Tonsillectomy    . Tonsillectomy    . Corneal transplant  right 2009, left 2009    both eyes, 3 in left eye, 1 in right eye  . Cataract surgery  2005    both eyes  . Abdominal hysterectomy   sept 1985    ovaries removed  . Total knee arthroplasty Left 06/27/2012    Procedure: LEFT TOTAL KNEE ARTHROPLASTY;  Surgeon: Gearlean Alf, MD;  Location: [facility];  Service: Orthopedics;  Laterality: Left;  . Tubal ligation    . Total  knee arthroplasty Right 12/12/2012    Procedure: RIGHT TOTAL KNEE ARTHROPLASTY;  Surgeon: Gearlean Alf, MD;  Location: [facility];  Service: Orthopedics;  Laterality: Right;  . Loop recorder implant N/A 01/05/2014    Procedure: LOOP RECORDER IMPLANT;  Surgeon: Coralyn Mark, MD;  Location: [facility];  Service: Cardiovascular;  Laterality: N/A;    Social History:  reports that she has never smoked. She has never used smokeless tobacco. She reports that she does not drink alcohol or use illicit drugs.  Family History:  Family History  Problem Relation Age of Onset  . Stroke Mother   . Heart attack Father   . Heart attack Brother   . Cancer Brother   . Heart attack Brother   . Heart attack Brother   . Heart attack Brother   . Parkinsonism Brother      Prior to Admission medications   Medication Sig Start Date End Date Taking? Authorizing Provider  aspirin EC 81 MG EC tablet Take 1 tablet (81 mg total) by mouth daily. 01/22/14  Yes Geradine Girt, DO  BYSTOLIC 5 MG tablet take 1 tablet by mouth twice a day 07/17/14  Yes [name], MD  cholecalciferol (VITAMIN D) 1000 UNITS tablet Take 1,000 Units by mouth daily.   Yes Historical Provider, MD  ciprofloxacin (CIPRO) 500 MG tablet Take 1 tablet (500 mg total) by mouth 2 (two) times daily. 28 12/17/14  Yes Antonietta Breach, PA-C  clopidogrel (PLAVIX) 75 MG tablet take 1 tablet by mouth once daily Patient taking differently: Take 1 tablet by mouth once daily 12/10/14  Yes [name], MD  Melatonin 1 MG SUBL Place 1 mg under the tongue at bedtime.   Yes Historical Provider, MD  metroNIDAZOLE (FLAGYL) 500 MG tablet Take 1 tablet (500 mg total) by mouth 3 (three) times daily. 12/17/14  Yes Antonietta Breach, PA-C  Multiple Vitamin (MULTI VITAMIN DAILY PO) Take 1 tablet by mouth every morning.   Yes Historical Provider, MD  prednisoLONE acetate (PRED FORTE) 1 % ophthalmic suspension Place 1 drop into both eyes 2 (two) times daily.    Yes Historical  Provider, MD  ranitidine (ZANTAC) 150 MG tablet Take 1 tablet (150 mg total) by mouth at bedtime. 11/07/14  Yes Lafayette Dragon, MD  amLODipine (NORVASC) 2.5 MG tablet Take 2.5 mg by mouth daily as needed.    Historical Provider, MD  hyoscyamine (LEVSIN SL) 0.125 MG SL tablet Place 1 tablet (0.125 mg total) under the tongue every 4 (four) hours as needed. 11/06/14   Lafayette Dragon, MD  nitroGLYCERIN (NITROSTAT) 0.4 MG SL tablet Place 1 tablet (0.4 mg total) under the tongue every 5 (five) minutes as needed for chest pain. 02/05/14   Imogene Burn, PA-C    Physical Exam: Filed Vitals:   12/19/14 1750 12/19/14 2241 12/20/14 0035  BP: 158/68 165/62 162/64  Pulse: 79 72 63  Temp: 98.3 F (36.8  C)  98.1 F (36.7 C)  TempSrc: Oral  Oral  Resp: 16 16 18   SpO2: 94% 93% 93%   General: Not in acute distress HEENT:       Eyes: PERRL, EOMI, no scleral icterus.       ENT: No discharge from the ears and nose, no pharynx injection, no tonsillar enlargement.        Neck: No JVD, no bruit, no mass felt. Heme: No neck lymph node enlargement. Cardiac: S1/S2, RRR, No murmurs, No gallops or rubs. Pulm:  No rales, wheezing, rhonchi or rubs. Abd: Soft, nondistended, tenderness over LLQ, no rebound pain, no organomegaly, BS present. Ext: No pitting leg edema bilaterally. 2+DP/PT pulse bilaterally. Musculoskeletal: No joint deformities, No joint redness or warmth, no limitation of ROM in spin. Skin: No rashes.  Neuro: Alert, oriented X3, cranial nerves II-XII grossly intact, muscle strength 5/5 in all extremities, sensation to light touch intact.  Psych: Patient is not psychotic, no suicidal or hemocidal ideation.  Labs on Admission:  Basic Metabolic Panel:  Recent Labs Lab 12/17/14 1350 12/19/14 1807 12/20/14 0120  NA 138 137 139  K 4.2 4.4 3.6  CL 102 102 104  CO2 28 28 29   GLUCOSE 122* 168* 107*  BUN 15 17 13   CREATININE 0.66 0.75 0.56  CALCIUM 9.2 9.3 8.8*   Liver Function Tests:  Recent  Labs Lab 12/17/14 1350 12/19/14 1807  AST 41 45*  ALT 30 34  ALKPHOS 128* 124  BILITOT 0.6 0.3  PROT 6.3* 6.5  ALBUMIN 3.7 3.9    Recent Labs Lab 12/17/14 1350  LIPASE 22   No results for input(s): AMMONIA in the last 168 hours. CBC:  Recent Labs Lab 12/17/14 1350 12/19/14 1807 12/20/14 0120  WBC 5.9 6.9 5.8  HGB 13.9 14.0 13.6  HCT 42.4 42.8 42.0  MCV 96.4 95.5 95.7  PLT 182 189 180   Cardiac Enzymes: No results for input(s): CKTOTAL, CKMB, CKMBINDEX, TROPONINI in the last 168 hours.  BNP (last 3 results) No results for input(s): BNP in the last 8760 hours.  ProBNP (last 3 results) No results for input(s): PROBNP in the last 8760 hours.  CBG: No results for input(s): GLUCAP in the last 168 hours.  Radiological Exams on Admission: No results found.  EKG:  Not done in ED, will get one.   Assessment/Plan Principal Problem:   Diverticulitis Active Problems:   OA (osteoarthritis) of knee   LLQ abdominal pain   NSTEMI (non-ST elevated myocardial infarction)   History of stroke   UTI (lower urinary tract infection)   Essential hypertension   CAD (coronary artery disease)  Diverticulitis: Patient has a recurrent diverticulitis as evidenced by the CT scan. She failed oral antibiotics treatment as outpt. Currently patient is not septic. I planned to repeat CT-abd/pelvise to assess whether patient may have small perforation or abscess, but pt refused. GI was consulted by ED.  - will admit to med surg bed - npo  - prn morphine for pain  - Zofran prn for nausea - Will check C diff pcr and stool culture - Blood culture x 2 - IV antibiotics: Flagyl plus Cipro - IVF: NS 125 cc/h - f/u GI recommendations   GERD: -Pepcid IV  CAD and hx of NSTEMI: No chest pain. No hx of stent -continue ASA and plavix -I discussed with pt about holding plavix due to bloody stool, she would like to wait until GI sees her. -continue bystolic  History of stroke: -  On ASA  and plavix  Possible UTI: Patient has mildly increased urinary frequency and pressure-like feeling over suprapubic area. Her urinalysis is positive for UTI -On cipro and flagyl -f/u blood and urine culture  HTN: -continue home amlodipine -IV hydralazine when necessary   DVT ppx: SCD  Code Status: Full code Family Communication:   Yes, patient's daguhter  at bed side Disposition Plan: Admit to inpatient   Date of Service 12/20/2014    Ivor Costa [facility] Pager [phone]  If 7PM-7AM, please contact night-coverage [email or web address] Password [email or web address] 12/20/2014, 3:59 AM

## 2014-12-19 NOTE — Telephone Encounter (Signed)
Spoke with patient and her daughter. Patient states she is now passing mucous with blood and no stool. She also states that her pain is worse now. Denies fever. Patient advised to go to ED.

## 2014-12-19 NOTE — Telephone Encounter (Signed)
Patient daughter, [name] called back in stating that patient is now passing mucous and blood. She is requesting a callback before we close. Best # [phone]

## 2014-12-19 NOTE — Telephone Encounter (Signed)
Called patient. She was diagnosed with diverticulitis on Monday, rectosigmoid colon on CT done in the ER. She has had this previously. On antibiotics. Overall she is not feeling worse since it began. Pain can fluctuate and she has had times where it has not been as bad. No fevers obvious but asked that she continue to take temperature and monitor. Recommend she continue antibiotics as scheduled. If she has fevers or worsening pain she should go to the ER or call us to consider hospitalization for IV antibiotics although it does not appear she is worsening from what she is telling me. Suspect she may need some more time to resolve this. Again advised her to call me back if she feels she is worsening in any way. WBC was normal in the ED. She agreed.

## 2014-12-20 DIAGNOSIS — I10 Essential (primary) hypertension: Secondary | ICD-10-CM

## 2014-12-20 DIAGNOSIS — K5730 Diverticulosis of large intestine without perforation or abscess without bleeding: Secondary | ICD-10-CM

## 2014-12-20 DIAGNOSIS — R1032 Left lower quadrant pain: Secondary | ICD-10-CM

## 2014-12-20 DIAGNOSIS — I2510 Atherosclerotic heart disease of native coronary artery without angina pectoris: Secondary | ICD-10-CM

## 2014-12-20 LAB — CBC
HCT: 42 % (ref 36.0–46.0)
HEMOGLOBIN: 13.6 g/dL (ref 12.0–15.0)
MCH: 31 pg (ref 26.0–34.0)
MCHC: 32.4 g/dL (ref 30.0–36.0)
MCV: 95.7 fL (ref 78.0–100.0)
PLATELETS: 180 10*3/uL (ref 150–400)
RBC: 4.39 MIL/uL (ref 3.87–5.11)
RDW: 12.9 % (ref 11.5–15.5)
WBC: 5.8 10*3/uL (ref 4.0–10.5)

## 2014-12-20 LAB — C DIFFICILE QUICK SCREEN W PCR REFLEX
C DIFFICILE (CDIFF) INTERP: NEGATIVE
C Diff antigen: NEGATIVE
C Diff toxin: NEGATIVE

## 2014-12-20 LAB — GLUCOSE, CAPILLARY: GLUCOSE-CAPILLARY: 100 mg/dL — AB (ref 65–99)

## 2014-12-20 LAB — BASIC METABOLIC PANEL
ANION GAP: 6 (ref 5–15)
BUN: 13 mg/dL (ref 6–20)
CALCIUM: 8.8 mg/dL — AB (ref 8.9–10.3)
CO2: 29 mmol/L (ref 22–32)
CREATININE: 0.56 mg/dL (ref 0.44–1.00)
Chloride: 104 mmol/L (ref 101–111)
GFR calc Af Amer: >60 mL/min (ref >60)
GLUCOSE: 107 mg/dL — AB (ref 65–99)
Potassium: 3.6 mmol/L (ref 3.5–5.1)
Sodium: 139 mmol/L (ref 135–145)

## 2014-12-20 LAB — APTT: aPTT: 29 seconds (ref 24–37)

## 2014-12-20 LAB — PROTIME-INR
INR: 1.09 (ref 0.00–1.49)
PROTHROMBIN TIME: 14.3 s (ref 11.6–15.2)

## 2014-12-20 MED ORDER — HYOSCYAMINE SULFATE 0.125 MG SL SUBL
0.1250 mg | SUBLINGUAL_TABLET | Freq: Three times a day (TID) | SUBLINGUAL | Status: DC | PRN
  Filled 2014-12-20: qty 1

## 2014-12-20 NOTE — Progress Notes (Signed)
OT Cancellation Note  Patient Details Name: [name] [name] MRN: [id number] DOB: 11/10/1924   Cancelled Treatment:    Reason Eval/Treat Not Completed: OT screened, no needs identified, will sign off. Pt states she has been getting up to the bathroom and doing sponge bath here without difficulty. She states she only feels confined by the IV pole. Daughter present and confirms that she feels OT is not needed at this time. Will sign off per pt and family request. Note pt was independent with PT  [name]  [phone] 12/20/2014, 1:06 PM

## 2014-12-20 NOTE — Telephone Encounter (Signed)
Not my patient. Establishing with Dr. Silverio Decamp

## 2014-12-20 NOTE — Progress Notes (Signed)
PHARMACIST - PHYSICIAN ORDER COMMUNICATION  CONCERNING: P&T Medication Policy on Herbal Medications  DESCRIPTION:  This patient's order for:  Melatonin  has been noted.  This product(s) is classified as an "herbal" or natural product. Due to a lack of definitive safety studies or FDA approval, nonstandard manufacturing practices, plus the potential risk of unknown drug-drug interactions while on inpatient medications, the Pharmacy and Therapeutics Committee does not permit the use of "herbal" or natural products of this type within [facility].   ACTION TAKEN: The pharmacy department is unable to verify this order at this time and your patient has been informed of this safety policy. Please reevaluate patient's clinical condition at discharge and address if the herbal or natural product(s) should be resumed at that time.   Thanks Dorrene German 12/20/2014 12:21 AM

## 2014-12-20 NOTE — Care Management Note (Signed)
Case Management Note  Patient Details  Name: [name] [name] MRN: [id number] Date of Birth: Jan 26, 1925  Subjective/Objective:  81 y/o f admitted w/diverticulitis. From home. PT cons-await recc.                 Action/Plan:d/c plan home.   Expected Discharge Date:   (unknown)               Expected Discharge Plan:  Home/Self Care  In-House Referral:     Discharge planning Services  CM Consult  Post Acute Care Choice:    Choice offered to:     DME Arranged:    DME Agency:     HH Arranged:    HH Agency:     Status of Service:  In process, will continue to follow  Medicare Important Message Given:    Date Medicare IM Given:    Medicare IM give by:    Date Additional Medicare IM Given:    Additional Medicare Important Message give by:     If discussed at [place] of Stay Meetings, dates discussed:    Additional Comments:  Dessa Phi, RN 12/20/2014, 4:13 PM

## 2014-12-20 NOTE — Consult Note (Signed)
Referring Provider:  [facility] Primary Care Physician:  Marjorie Smolder, MD Primary Gastroenterologist:  Dr. Olevia Perches  Reason for Consultation:  diverticulitis     HPI: [name] [name] is a 81 y.o. female who was previously followed by Dr. Olevia Perches. She has a history of chronic symptomatic diverticulosis and chronic left lower quadrant abdominal pain. She has had fairly constant pain in the left lower quadrant since September 2015. At that time she was vacationing at the beach and developed pain. She was seen in the local hospital and had a CT scan that showed diverticulitis with a questionable confined perforation. She was sent home on twice a day Cipro and Flagyl and took several courses of this between September 2015 in January 2016. She has a history of mild prominence of the intrahepatic radicle secondary to prior cholecystectomy. Prior colonoscopy in 2004 showed diverticulosis.  She was evaluated in the emergency room on September 20 623-4 day history of left lower quadrant abdominal pain. Patient was having soft brown stools and occasional passage of clear mucus. She had some leftover Cipro and Flagyl at home and took several doses before presenting to the emergency room. CT of the abdomen and pelvis performed in the emergency room on September 26 showed diverticulitis in the rectal sigmoid junction with no abscess. She was given a dose of IV Cipro and Flagyl and advised to follow-up as an outpatient. She called the GI office and was prescribed Cipro and Flagyl and scheduled for follow-up however her pain became more severe she presented to the emergency room last evening. She has left lower quadrant pain but no nausea or vomiting. She has no fever or chills. She has been having some dysuria and urinary frequency. When evaluated inpatient room, patient had a formed bowel movement this morning. No blood.  Other pertinent past medical history includes left eye blindness, GERD, SVT, gastritis,  stroke, CAD, and myocardial infarction.She is treated with aspirin and Plavix after a stroke.   Past Medical History  Diagnosis Date  . Arthritis   . Blind left eye     Corneal transplant x3 with rejection  . Dysrhythmia   . Varicose veins     both legs and left fooot at arch  . Complication of anesthesia     sensitive to meds, bp can fall  . SVT (supraventricular tachycardia) 12/15/2011    seen by Dr. Caryl Comes  . Helicobacter pylori (H. pylori) 05/22/02    RUT-Positive  . Gastritis   . Diverticulosis   . Stroke   . Irregular heartbeat   . History of stroke 06/14/2014    Past Surgical History  Procedure Laterality Date  . Cholecystectomy    . Tonsillectomy    . Tonsillectomy    . Corneal transplant  right 2009, left 2009    both eyes, 3 in left eye, 1 in right eye  . Cataract surgery  2005    both eyes  . Abdominal hysterectomy   sept 1985    ovaries removed  . Total knee arthroplasty Left 06/27/2012    Procedure: LEFT TOTAL KNEE ARTHROPLASTY;  Surgeon: Gearlean Alf, MD;  Location: [facility];  Service: Orthopedics;  Laterality: Left;  . Tubal ligation    . Total knee arthroplasty Right 12/12/2012    Procedure: RIGHT TOTAL KNEE ARTHROPLASTY;  Surgeon: Gearlean Alf, MD;  Location: [facility];  Service: Orthopedics;  Laterality: Right;  . Loop recorder implant N/A 01/05/2014    Procedure: LOOP RECORDER IMPLANT;  Surgeon: Jeneen Rinks  D Allred, MD;  Location: [facility];  Service: Cardiovascular;  Laterality: N/A;    Prior to Admission medications   Medication Sig Start Date End Date Taking? Authorizing Provider  aspirin EC 81 MG EC tablet Take 1 tablet (81 mg total) by mouth daily. 01/22/14  Yes Geradine Girt, DO  BYSTOLIC 5 MG tablet take 1 tablet by mouth twice a day 07/17/14  Yes [name], MD  cholecalciferol (VITAMIN D) 1000 UNITS tablet Take 1,000 Units by mouth daily.   Yes Historical Provider, MD  ciprofloxacin (CIPRO) 500 MG tablet Take 1 tablet (500 mg total) by mouth 2  (two) times daily. 28 12/17/14  Yes Antonietta Breach, PA-C  clopidogrel (PLAVIX) 75 MG tablet take 1 tablet by mouth once daily Patient taking differently: Take 1 tablet by mouth once daily 12/10/14  Yes [name], MD  Melatonin 1 MG SUBL Place 1 mg under the tongue at bedtime.   Yes Historical Provider, MD  metroNIDAZOLE (FLAGYL) 500 MG tablet Take 1 tablet (500 mg total) by mouth 3 (three) times daily. 12/17/14  Yes Antonietta Breach, PA-C  Multiple Vitamin (MULTI VITAMIN DAILY PO) Take 1 tablet by mouth every morning.   Yes Historical Provider, MD  prednisoLONE acetate (PRED FORTE) 1 % ophthalmic suspension Place 1 drop into both eyes 2 (two) times daily.    Yes Historical Provider, MD  ranitidine (ZANTAC) 150 MG tablet Take 1 tablet (150 mg total) by mouth at bedtime. 11/07/14  Yes Lafayette Dragon, MD  amLODipine (NORVASC) 2.5 MG tablet Take 2.5 mg by mouth daily as needed.    Historical Provider, MD  hyoscyamine (LEVSIN SL) 0.125 MG SL tablet Place 1 tablet (0.125 mg total) under the tongue every 4 (four) hours as needed. 11/06/14   Lafayette Dragon, MD  nitroGLYCERIN (NITROSTAT) 0.4 MG SL tablet Place 1 tablet (0.4 mg total) under the tongue every 5 (five) minutes as needed for chest pain. 02/05/14   Imogene Burn, PA-C    Current Facility-Administered Medications  Medication Dose Route Frequency Provider Last Rate Last Dose  . 0.9 %  sodium chloride infusion   Intravenous Continuous Ivor Costa, MD 125 mL/hr at 12/20/14 0031    . amLODipine (NORVASC) tablet 2.5 mg  2.5 mg Oral Daily Ivor Costa, MD      . aspirin EC tablet 81 mg  81 mg Oral Daily Ivor Costa, MD      . cholecalciferol (VITAMIN D) tablet 1,000 Units  1,000 Units Oral Daily Ivor Costa, MD      . ciprofloxacin (CIPRO) IVPB 400 mg  400 mg Intravenous Q24H Ivor Costa, MD   400 mg at 12/20/14 0054  . clopidogrel (PLAVIX) tablet 75 mg  75 mg Oral Daily Ivor Costa, MD      . famotidine (PEPCID) IVPB 20 mg premix  20 mg Intravenous Q24H Ivor Costa, MD    20 mg at 12/20/14 0203  . hydrALAZINE (APRESOLINE) injection 5 mg  5 mg Intravenous Q2H PRN Ivor Costa, MD      . metroNIDAZOLE (FLAGYL) IVPB 500 mg  500 mg Intravenous Q8H Ivor Costa, MD   500 mg at 12/20/14 0521  . morphine 2 MG/ML injection 2 mg  2 mg Intravenous Q4H PRN Ivor Costa, MD      . multivitamin with minerals tablet   Oral q morning - 10a Ivor Costa, MD      . nebivolol (BYSTOLIC) tablet 5 mg  5 mg Oral BID Ivor Costa,  MD   5 mg at 12/20/14 0101  . nitroGLYCERIN (NITROSTAT) SL tablet 0.4 mg  0.4 mg Sublingual Q5 min PRN Ivor Costa, MD      . ondansetron [facility]) injection 4 mg  4 mg Intravenous Q8H PRN Ivor Costa, MD      . prednisoLONE acetate (PRED FORTE) 1 % ophthalmic suspension 1 drop  1 drop Both Eyes BID Ivor Costa, MD   1 drop at 12/20/14 0102  . sodium chloride 0.9 % injection 3 mL  3 mL Intravenous Q12H Ivor Costa, MD   3 mL at 12/20/14 0102    Allergies as of 12/19/2014 - Review Complete 12/19/2014  Allergen Reaction Noted  . Ultram [tramadol] Hives 07/08/2012  . Buprenex [buprenorphine hcl] Other (See Comments) 01/30/2011  . Keflex [cephalexin] Diarrhea 04/17/2014  . Other Swelling 01/22/2014  . Augmentin [amoxicillin-pot clavulanate] Nausea And Vomiting 01/04/2014  . Levofloxacin Other (See Comments) 01/30/2011  . Percocet [oxycodone-acetaminophen] Nausea And Vomiting and Other (See Comments) 06/21/2012  . Sulfa antibiotics Other (See Comments) 01/30/2011  . Tape Other (See Comments) 06/21/2012  . Zetia [ezetimibe] Other (See Comments) 01/30/2011    Family History  Problem Relation Age of Onset  . Stroke Mother   . Heart attack Father   . Heart attack Brother   . Cancer Brother   . Heart attack Brother   . Heart attack Brother   . Heart attack Brother   . Parkinsonism Brother     Social History   Social History  . Marital Status: Widowed    Spouse Name: N/A  . Number of Children: 3  . Years of Education: N/A   Occupational History  . retired    Social  History Main Topics  . Smoking status: Never Smoker   . Smokeless tobacco: Never Used  . Alcohol Use: No  . Drug Use: No  . Sexual Activity: No   Other Topics Concern  . Not on file   Social History Narrative   Patient is right handed   Patient lives alone.   Patient drinks 1 cup of coffee daily.    Review of Systems: Gen: Denies any fever, chills, sweats,  weakness, malaise, weight loss, and sleep disorder. Has felt fatigued and has no appetite CV: Denies chest pain, angina, palpitations, syncope, orthopnea, PND, peripheral edema, and claudication. Resp: Denies dyspnea at rest, dyspnea with exercise, cough, sputum, wheezing, coughing up blood, and pleurisy. GI: Denies vomiting blood, jaundice, and fecal incontinence.   Denies dysphagia or odynophagia. Has left lower quadrant pain GU : Has urinary frequency MS: Denies joint pain, limitation of movement, and swelling, stiffness, low back pain, extremity pain. Denies muscle weakness, cramps, atrophy.  Derm: Denies rash, itching, dry skin, hives, moles, warts, or unhealing ulcers.  Psych: Denies depression, anxiety, memory loss, suicidal ideation, hallucinations, paranoia, and confusion. Heme: Denies bruising, bleeding, and enlarged lymph nodes. Neuro:  Denies any headaches, dizziness, paresthesias. Endo:  Denies any problems with DM, thyroid, adrenal function.  Physical Exam: Vital signs in last 24 hours: Temp:  [98 F (36.7 C)-98.3 F (36.8 C)] 98 F (36.7 C) (09/29 0526) Pulse Rate:  [63-79] 67 (09/29 0526) Resp:  [16-18] 18 (09/29 0526) BP: (150-165)/(62-76) 150/76 mmHg (09/29 0526) SpO2:  [93 %-94 %] 94 % (09/29 0526) Last BM Date: 12/19/14 General:   Alert,  Well-developed, well-nourished, pleasant and cooperative in NAD Head:  Normocephalic and atraumatic. Eyes:  Left eye opacified, conjunctiva pink. Ears:  Normal auditory acuity. Nose:  No deformity,  discharge,  or lesions. Mouth:  No deformity or lesions.   Neck:   Supple; no masses or thyromegaly. Lungs:  Clear throughout to auscultation.   Marland Kitchen Heart:  Regular rate and rhythm Abdomen:  Soft tender to palpation LLQ and suprapubic area, BS active,nonpalp mass or hsm.   Rectal:  Deferred  Msk:  Symmetrical without gross deformities. . Pulses:  Normal pulses noted. Extremities: Without clubbing or edema. Neurologic: Alert and  oriented x4;  grossly normal neurologically. Skin:  Intact without significant lesions or rashes.. Psych:  Alert and cooperative. Normal mood and affect.  Intake/Output from previous day: 09/28 0701 - 09/29 0700 In: -  Out: 200 [Urine:200] Intake/Output this shift:    Lab Results:  Recent Labs  12/17/14 1350 12/19/14 1807 12/20/14 0120  WBC 5.9 6.9 5.8  HGB 13.9 14.0 13.6  HCT 42.4 42.8 42.0  PLT 182 189 180   BMET  Recent Labs  12/17/14 1350 12/19/14 1807 12/20/14 0120  NA 138 137 139  K 4.2 4.4 3.6  CL 102 102 104  CO2 28 28 29   GLUCOSE 122* 168* 107*  BUN 15 17 13   CREATININE 0.66 0.75 0.56  CALCIUM 9.2 9.3 8.8*   LFT  Recent Labs  12/19/14 1807  PROT 6.5  ALBUMIN 3.9  AST 45*  ALT 34  ALKPHOS 124  BILITOT 0.3   PT/INR  Recent Labs  12/20/14 0120  LABPROT 14.3  INR 1.09    Studies/Results:     Show images for CT Abdomen Pelvis W Contrast     Study Result     CLINICAL DATA: Left lower quadrant abdomen pain for 2 weeks  EXAM: CT ABDOMEN AND PELVIS WITH CONTRAST  TECHNIQUE: Multidetector CT imaging of the abdomen and pelvis was performed using the standard protocol following bolus administration of intravenous contrast.  CONTRAST: 180mL OMNIPAQUE IOHEXOL 300 MG/ML SOLN  COMPARISON: November 08, 2014  FINDINGS: The liver, spleen, pancreas, adrenal glands and kidneys are normal. There is mild dilatation of the right ureter without focal obstructing stone identified. Patient is status post prior cholecystectomy. There is atherosclerosis of the abdominal  aorta without aneurysmal dilatation. There is no abdominal lymphadenopathy.  There is no small bowel obstruction. The appendix is normal. There is stranding and inflammation surrounding the rectal sigmoid junction with thickening of the rectal sigmoid colonic wall. There is diverticulosis of colon.  Images of the pelvis demonstrate fluid-filled bladder without abnormality. The patient is status post prior cholecystectomy. The lung bases demonstrate no focal pneumonia or pleural effusion. Degenerative joint changes of the spine are noted.  IMPRESSION: Diverticulitis in the rectal sigmoid junction. No abscess is identified.   Electronically Signed  By: Abelardo Diesel M.D.  On: 12/17/2014 18:56     IMPRESSION/PLAN: 81 year old female admitted with recurrent diverticulitis. Recent CT on September 26 shows diverticulitis in the rectal sigmoid area with no abscess. Patient currently on IV Cipro and Flagyl. Would continue IV and a biopsy. If no improvement in a day or 2 with repeat CT to evaluate for possible abscess. Continue Zofran as needed. PPI. Patient had formed stool this morning so nursing staff unable to obtain stool for C. difficile this far. Clear liquid diet. Anti-spasmodic as needed for cramping. -GERD -HTN - Hx stroke -Hx CAD and NSTEMI -Dysuria--U/A with UTI--on cipro and flagyl.   Hvozdovic, Deloris Ping 12/20/2014,  Pager [phone]

## 2014-12-20 NOTE — Progress Notes (Addendum)
Patient ID: [name] [name], female   DOB: 04/27/1924, 81 y.o.   MRN: [id number] [facility] PROGRESS NOTE  Aqueelah Cotrell HGD:[id number] DOB: [birth year] DOA: 12/19/2014 PCP: Marjorie Smolder, MD  Brief narrative:    81 y.o. female with past medical history of left eye blindness, status post corneal transplantation in 2009, gastritis, recurrent diverticulitis, coronary artery disease, stroke. She presented to [facility] hospital 12/19/2014 with severe left lower quadrant abdominal pain started about 2 weeks prior to this admission. She was on Cipro and Flagyl since 12/14/2014 as recommended by GI doctor. She had CT abdomen 12/17/2014 which showed diverticulitis in the rectal sigmoid junction without abscess. Since pain did not improve she presented to ED for further evaluation.   Anticipated discharge: continue IV Cipro and Flagyl. GI is following. Anticipated discharge once diverticulitis improves.   Assessment/Plan:    Principal Problem: Left lower quadrant abdominal pain / Acute rectal sigmoid junction diverticulitis without hemorrhage  - Pain slightly better controlled this morning. We will continue IV Cipro and Flagyl. - Continue IV fluids for hydration - Continue pain management efforts - Appreciate GI following her recommendations.  Active Problems: CAD - Continue aspirin and plavix - Stable, no reports of chest pain   Essential hypertension  - Continue Nebivolol 5 mg PO BID, Norvasc 2.5 mg daily     DVT Prophylaxis  - SCD's bilaterally    Code Status: Full.  Family Communication:  plan of care discussed with the patient and her daughter at the bedside Disposition Plan: Home once diverticulitis resolves  IV access:  Peripheral IV  Procedures and diagnostic studies:    Ct Abdomen Pelvis W Contrast 12/17/2014  Diverticulitis in the rectal sigmoid junction. No abscess is identified.    Medical Consultants:  Gastroenterology  Other Consultants:  None   IAnti-Infectives:    Cipro 12/19/2014 --> Flagyl 12/20/2014 -->   Leisa Lenz, MD  [facility] Pager [phone]  Time spent in minutes: 25 minutes  If 7PM-7AM, please contact night-coverage [email or web address] Password [facility] 12/20/2014, 12:38 PM   LOS: 1 day    HPI/Subjective: No acute overnight events. Patient reports LLQ pain is 8/10, intermittent.   Objective: Filed Vitals:   12/19/14 2241 12/20/14 0035 12/20/14 0526 12/20/14 1030  BP: 165/62 162/64 150/76 157/61  Pulse: 72 63 67 62  Temp:  98.1 F (36.7 C) 98 F (36.7 C) 98.3 F (36.8 C)  TempSrc:  Oral Oral Oral  Resp: 16 18 18 18   SpO2: 93% 93% 94%     Intake/Output Summary (Last 24 hours) at 12/20/14 1238 Last data filed at 12/20/14 0923  Gross per 24 hour  Intake      0 ml  Output    200 ml  Net   -200 ml    Exam:   General:  Pt is alert, follows commands appropriately, not in acute distress  Cardiovascular: Regular rate and rhythm, S1/S2, no murmurs  Respiratory: Clear to auscultation bilaterally, no wheezing, no crackles, no rhonchi  Abdomen: Soft, tender to palpation on left mid to lower abdomen, non distended, bowel sounds present, no guarding   Extremities: No edema, pulses DP and PT palpable bilaterally  Neuro: Grossly nonfocal  Data Reviewed: Basic Metabolic Panel:  Recent Labs Lab 12/17/14 1350 12/19/14 1807 12/20/14 0120  NA 138 137 139  K 4.2 4.4 3.6  CL 102 102 104  CO2 28 28 29   GLUCOSE 122* 168* 107*  BUN 15 17 13   CREATININE 0.66 0.75 0.56  CALCIUM 9.2 9.3 8.8*  Liver Function Tests:  Recent Labs Lab 12/17/14 1350 12/19/14 1807  AST 41 45*  ALT 30 34  ALKPHOS 128* 124  BILITOT 0.6 0.3  PROT 6.3* 6.5  ALBUMIN 3.7 3.9    Recent Labs Lab 12/17/14 1350  LIPASE 22   No results for input(s): AMMONIA in the last 168 hours. CBC:  Recent Labs Lab 12/17/14 1350 12/19/14 1807 12/20/14 0120  WBC 5.9 6.9 5.8  HGB 13.9 14.0 13.6  HCT 42.4 42.8 42.0  MCV 96.4 95.5 95.7  PLT  182 189 180   Cardiac Enzymes: No results for input(s): CKTOTAL, CKMB, CKMBINDEX, TROPONINI in the last 168 hours. BNP: Invalid input(s): POCBNP CBG:  Recent Labs Lab 12/20/14 0756  GLUCAP 100*    Recent Results (from the past 240 hour(s))  C difficile quick scan w PCR reflex     Status: None   Collection Time: 12/20/14  9:42 AM  Result Value Ref Range Status   C Diff antigen NEGATIVE NEGATIVE Final   C Diff toxin NEGATIVE NEGATIVE Final   C Diff interpretation Negative for toxigenic C. difficile  Final     Scheduled Meds: . amLODipine  2.5 mg Oral Daily  . aspirin EC  81 mg Oral Daily  . cholecalciferol  1,000 Units Oral Daily  . ciprofloxacin  400 mg Intravenous Q24H  . clopidogrel  75 mg Oral Daily  . famotidine (PEPCID) IV  20 mg Intravenous Q24H  . metronidazole  500 mg Intravenous Q8H  . multivitamin with minerals   Oral q morning - 10a  . nebivolol  5 mg Oral BID  . prednisoLONE acetate  1 drop Both Eyes BID  . sodium chloride  3 mL Intravenous Q12H   Continuous Infusions: . sodium chloride 125 mL/hr at 12/20/14 [phone]

## 2014-12-20 NOTE — Evaluation (Signed)
Physical Therapy Evaluation Patient Details Name: Alanee Ting MRN: [id number] DOB: 04-28-1924 Today's Date: 12/20/2014   History of Present Illness  81 y.o. female with PMH of left eye blindness, post listhesis of corneal transplantation 2009, GERD, right sided weakness, SVT, gastritis, recurrent diverticulitis, stroke, CAD, MI, who presents with abdominal pain.Dx of diverticulitis.  Clinical Impression  Pt is independent with mobility, she walked 240' without an assistive device, no loss of balance. No further PT indicated. Pt feels she at baseline with mobility. Will sign off. From PT standpoint she is safe to DC home.     Follow Up Recommendations No PT follow up    Equipment Recommendations  None recommended by PT    Recommendations for Other Services       Precautions / Restrictions Precautions Precautions: None Precaution Comments: blind L eye Restrictions Weight Bearing Restrictions: No      Mobility  Bed Mobility Overal bed mobility: Independent                Transfers Overall transfer level: Independent                  Ambulation/Gait Ambulation/Gait assistance: Independent Ambulation Distance (Feet): 240 Feet Assistive device: None Gait Pattern/deviations: WFL(Within Functional Limits)   Gait velocity interpretation: at or above normal speed for age/gender General Gait Details: steady, no LOB  Stairs            Wheelchair Mobility    Modified Rankin (Stroke Patients Only)       Balance Overall balance assessment: No apparent balance deficits (not formally assessed)                                           Pertinent Vitals/Pain Pain Assessment: No/denies pain    Home Living Family/patient expects to be discharged to:: Private residence Living Arrangements: Alone Available Help at Discharge: Family;Available 24 hours/day Type of Home: House Home Access: Ramped entrance;Stairs to enter Entrance  Stairs-Rails: None Entrance Stairs-Number of Steps: 1 Home Layout: Multi-level Home Equipment: Walker - 2 wheels;Cane - quad Additional Comments: Pt lives in split level home. stairs with rail and additional support structures that can be used for assist to get to bedroom,     Prior Function Level of Independence: Independent         Comments: pt does not drive 2/2 L eye blindness.      Hand Dominance   Dominant Hand: Right    Extremity/Trunk Assessment   Upper Extremity Assessment: Overall WFL for tasks assessed           Lower Extremity Assessment: Overall WFL for tasks assessed      Cervical / Trunk Assessment: Normal  Communication   Communication: No difficulties  Cognition Arousal/Alertness: Awake/alert Behavior During Therapy: WFL for tasks assessed/performed Overall Cognitive Status: Within Functional Limits for tasks assessed                      General Comments      Exercises        Assessment/Plan    PT Assessment Patent does not need any further PT services  PT Diagnosis     PT Problem List    PT Treatment Interventions     PT Goals (Current goals can be found in the Care Plan section) Acute Rehab PT Goals Patient Stated Goal: to work in  her garden PT Goal Formulation: All assessment and education complete, DC therapy    Frequency     Barriers to discharge        Co-evaluation               End of Session Equipment Utilized During Treatment: Gait belt Activity Tolerance: Patient tolerated treatment well Patient left: in chair;with call bell/phone within reach Nurse Communication: Mobility status         Time: 1041-1110 PT Time Calculation (min) (ACUTE ONLY): 29 min   Charges:   PT Evaluation $Initial PT Evaluation Tier I: 1 Procedure PT Treatments $Gait Training: 8-22 mins   PT G Codes:        Philomena Doheny 12/20/2014, 11:05 AM [phone]

## 2014-12-21 DIAGNOSIS — K5732 Diverticulitis of large intestine without perforation or abscess without bleeding: Principal | ICD-10-CM

## 2014-12-21 DIAGNOSIS — K5731 Diverticulosis of large intestine without perforation or abscess with bleeding: Secondary | ICD-10-CM

## 2014-12-21 DIAGNOSIS — K5712 Diverticulitis of small intestine without perforation or abscess without bleeding: Secondary | ICD-10-CM

## 2014-12-21 LAB — URINE CULTURE: CULTURE: NO GROWTH

## 2014-12-21 LAB — GLUCOSE, CAPILLARY: GLUCOSE-CAPILLARY: 86 mg/dL (ref 65–99)

## 2014-12-21 MED ORDER — CALCIUM CARBONATE ANTACID 500 MG PO CHEW
1.0000 | CHEWABLE_TABLET | Freq: Once | ORAL | Status: AC
  Administered 2014-12-21: 200 mg via ORAL
  Filled 2014-12-21: qty 1

## 2014-12-21 MED ORDER — FAMOTIDINE 20 MG PO TABS
20.0000 mg | ORAL_TABLET | Freq: Every day | ORAL | Status: DC
  Administered 2014-12-22 – 2014-12-23 (×2): 20 mg via ORAL
  Filled 2014-12-21 (×2): qty 1

## 2014-12-21 MED ORDER — HYDRALAZINE HCL 20 MG/ML IJ SOLN: 5.0000 mg | Freq: Four times a day (QID) | INTRAMUSCULAR | Status: DC

## 2014-12-21 MED ORDER — CIPROFLOXACIN IN D5W 400 MG/200ML IV SOLN
400.0000 mg | Freq: Two times a day (BID) | INTRAVENOUS | Status: DC
  Administered 2014-12-21 – 2014-12-22 (×3): 400 mg via INTRAVENOUS
  Filled 2014-12-21 (×4): qty 200

## 2014-12-21 MED ORDER — HYDRALAZINE HCL 10 MG PO TABS
10.0000 mg | ORAL_TABLET | Freq: Three times a day (TID) | ORAL | Status: DC
  Administered 2014-12-21 (×2): 10 mg via ORAL
  Filled 2014-12-21 (×7): qty 1

## 2014-12-21 NOTE — Progress Notes (Signed)
     [facility] Progress Note  Subjective:  Afeb.  Less pain. No N/V.WBC 5.8.   Objective:  Vital signs in last 24 hours: Temp:  [97.6 F (36.4 C)-98.3 F (36.8 C)] 97.6 F (36.4 C) (09/30 0728) Pulse Rate:  [62-67] 67 (09/30 0728) Resp:  [18-19] 19 (09/30 0728) BP: (149-166)/(56-70) 158/67 mmHg (09/30 0728) SpO2:  [94 %-98 %] 98 % (09/30 0728) Last BM Date: 12/20/14 General:   Alert,  Well-developed,    in NAD Heart:  Regular rate and rhythm; no murmurs Pulm;lungs clear Abdomen:  Soft, mild LLQ TTP Normal bowel sounds, without guarding, and without rebound.   Extremities:  Without edema. Neurologic: Alert and  oriented x4;  grossly normal neurologically. Psych:  Alert and cooperative. Normal mood and affect.  Intake/Output from previous day: 09/29 0701 - 09/30 0700 In: 4305.4 [P.O.:120; I.V.:3685.4; IV Piggyback:500] Out: -  Intake/Output this shift:    Lab Results:  Recent Labs  12/19/14 1807 12/20/14 0120  WBC 6.9 5.8  HGB 14.0 13.6  HCT 42.8 42.0  PLT 189 180   BMET  Recent Labs  12/19/14 1807 12/20/14 0120  NA 137 139  K 4.4 3.6  CL 102 104  CO2 28 29  GLUCOSE 168* 107*  BUN 17 13  CREATININE 0.75 0.56  CALCIUM 9.3 8.8*   LFT  Recent Labs  12/19/14 1807  PROT 6.5  ALBUMIN 3.9  AST 45*  ALT 34  ALKPHOS 124  BILITOT 0.3   PT/INR  Recent Labs  12/20/14 0120  LABPROT 14.3  INR 1.09     ASSESSMENT/PLAN:   81 year old female admitted with recurrent diverticulitis. Recent CT on September 26 shows diverticulitis in the rectal sigmoid area with no abscess. Patient currently on IV Cipro and Flagyl. Would continue IV antibiotics. Improving--doubt she will need repeat CT. Continue Zofran as needed. PPI. Patient had formed stool this morning. Soft/low residue diet. Anti-spasmodic as needed for cramping. -GERD -HTN - Hx stroke -Hx CAD and NSTEMI -Dysuria--U/A with UTI--on cipro and flagyl.     LOS: 2 days   Hvozdovic,  Deloris Ping 12/21/2014, Pager [phone]  GI Attending Note  I have personally taken an interval history, reviewed the chart, and examined the patient.  I agree with the extender's note, impression and recommendations.  He continues to improve.  Continue current antibiotics.  Sandy Salaam. Deatra Ina, MD, [facility] [phone]

## 2014-12-21 NOTE — Progress Notes (Signed)
Pharmacy IV to PO conversion  The patient is receiving FAMOTIDINE by the intravenous route.  Based on criteria approved by the Pharmacy and [place], the medication is being converted to the equivalent oral dose form.   No active GI bleeding or impaired absorption  Not s/p esophagectomy  Documented ability to take oral medications for > 24 hr  Plan to continue treatment for at least 1 day  If you have any questions about this conversion, please contact the Pharmacy Department (ext [phone]).  Thank you.  Reuel Boom, PharmD Pager: [phone] 12/21/2014, 3:36 PM

## 2014-12-21 NOTE — Progress Notes (Signed)
Patient ID: [name] [name], female   DOB: February 05, 1925, 81 y.o.   MRN: [id number] [facility] PROGRESS NOTE  [name] [name] [id number] DOB: Jul 29, 1924 DOA: 12/19/2014 PCP: Marjorie Smolder, MD  Brief narrative:    81 y.o. female with past medical history of left eye blindness, status post corneal transplantation in 2009, gastritis, recurrent diverticulitis, coronary artery disease, stroke. She presented to [facility] hospital 12/19/2014 with severe left lower quadrant abdominal pain started about 2 weeks prior to this admission. She was on Cipro and Flagyl since 12/14/2014 as recommended by GI doctor. She had CT abdomen 12/17/2014 which showed diverticulitis in the rectal sigmoid junction without abscess. Since pain did not improve she presented to ED for further evaluation.   Anticipated discharge: continue IV Cipro and Flagyl. Anticipated discharge by 12/23/2014.  Assessment/Plan:    Principal Problem: Left lower quadrant abdominal pain / Acute rectal sigmoid junction diverticulitis without hemorrhage  - Pain improving with IV Cipro and Flagyl. - Appreciate the GI input. - Tolerates clear liquids. Advance to soft diet today.  Active Problems: CAD - Continue aspirin and plavix  Essential hypertension  - Continue Nebivolol 5 mg PO BID, Norvasc 2.5 mg daily  - Since systolic blood pressure is 176 we will add hydralazine 10 mg by mouth every 8 hours.    DVT Prophylaxis  - SCD's bilaterally in hospital   Code Status: Full.  Family Communication:  plan of care discussed with the patient and her daughter at the bedside Disposition Plan: Home likely by 12/23/2014.  IV access:  Peripheral IV  Procedures and diagnostic studies:    Ct Abdomen Pelvis W Contrast 12/17/2014  Diverticulitis in the rectal sigmoid junction. No abscess is identified.    Medical Consultants:  Gastroenterology  Other Consultants:  None   IAnti-Infectives:   Cipro 12/19/2014 --> Flagyl 12/20/2014  -->   Leisa Lenz, MD  [facility] Pager [phone]  Time spent in minutes: 15 minutes  If 7PM-7AM, please contact night-coverage [email or web address] Password [facility] 12/21/2014, 12:39 PM   LOS: 2 days    HPI/Subjective: No acute overnight events. Feels better. Pain is 5 out of 10 this morning.  Objective: Filed Vitals:   12/21/14 0541 12/21/14 0728 12/21/14 1024 12/21/14 1204  BP: 149/56 158/67 178/88 176/64  Pulse: 65 67 67 68  Temp: 98.1 F (36.7 C) 97.6 F (36.4 C) 98 F (36.7 C) 97.8 F (36.6 C)  TempSrc: Oral Oral Oral Oral  Resp: 18 19 18 19   SpO2: 94% 98% 99%     Intake/Output Summary (Last 24 hours) at 12/21/14 1239 Last data filed at 12/21/14 0600  Gross per 24 hour  Intake 4305.42 ml  Output      0 ml  Net 4305.42 ml    Exam:   General:  Pt is alert, no distress  Cardiovascular: Rate control, appreciate S1-S2  Respiratory: Bilateral air entry, no wheezing  Abdomen: Tender on left side but no guarding or rebound, appreciate bowel sounds  Extremities: No leg swelling, pulses palpable  Neuro: No focal deficits  Data Reviewed: Basic Metabolic Panel:  Recent Labs Lab 12/17/14 1350 12/19/14 1807 12/20/14 0120  NA 138 137 139  K 4.2 4.4 3.6  CL 102 102 104  CO2 28 28 29   GLUCOSE 122* 168* 107*  BUN 15 17 13   CREATININE 0.66 0.75 0.56  CALCIUM 9.2 9.3 8.8*   Liver Function Tests:  Recent Labs Lab 12/17/14 1350 12/19/14 1807  AST 41 45*  ALT 30 34  ALKPHOS 128*  124  BILITOT 0.6 0.3  PROT 6.3* 6.5  ALBUMIN 3.7 3.9    Recent Labs Lab 12/17/14 1350  LIPASE 22   No results for input(s): AMMONIA in the last 168 hours. CBC:  Recent Labs Lab 12/17/14 1350 12/19/14 1807 12/20/14 0120  WBC 5.9 6.9 5.8  HGB 13.9 14.0 13.6  HCT 42.4 42.8 42.0  MCV 96.4 95.5 95.7  PLT 182 189 180   Cardiac Enzymes: No results for input(s): CKTOTAL, CKMB, CKMBINDEX, TROPONINI in the last 168 hours. BNP: Invalid input(s):  POCBNP CBG:  Recent Labs Lab 12/20/14 0756 12/21/14 0745  GLUCAP 100* 86    Recent Results (from the past 240 hour(s))  Urine culture     Status: None   Collection Time: 12/20/14  1:14 AM  Result Value Ref Range Status   Specimen Description URINE, CLEAN CATCH  Final   Special Requests NONE  Final   Culture   Final    NO GROWTH 1 DAY Performed at [facility]    Report Status 12/21/2014 FINAL  Final  C difficile quick scan w PCR reflex     Status: None   Collection Time: 12/20/14  9:42 AM  Result Value Ref Range Status   C Diff antigen NEGATIVE NEGATIVE Final   C Diff toxin NEGATIVE NEGATIVE Final   C Diff interpretation Negative for toxigenic C. difficile  Final  Stool culture     Status: None (Preliminary result)   Collection Time: 12/20/14  9:42 AM  Result Value Ref Range Status   Specimen Description STOOL  Final   Special Requests NONE  Final   Culture   Final    Culture reincubated for better growth Performed at [facility]    Report Status PENDING  Incomplete     Scheduled Meds: . amLODipine  2.5 mg Oral Daily  . aspirin EC  81 mg Oral Daily  . cholecalciferol  1,000 Units Oral Daily  . ciprofloxacin  400 mg Intravenous Q24H  . clopidogrel  75 mg Oral Daily  . famotidine (PEPCID) IV  20 mg Intravenous Q24H  . hydrALAZINE  5 mg Intravenous Q6H  . metronidazole  500 mg Intravenous Q8H  . multivitamin with minerals   Oral q morning - 10a  . nebivolol  5 mg Oral BID  . prednisoLONE acetate  1 drop Both Eyes BID  . sodium chloride  3 mL Intravenous Q12H   Continuous Infusions: . sodium chloride 125 mL/hr at 12/20/14 [phone]

## 2014-12-22 DIAGNOSIS — T887XXA Unspecified adverse effect of drug or medicament, initial encounter: Secondary | ICD-10-CM

## 2014-12-22 LAB — GLUCOSE, CAPILLARY: GLUCOSE-CAPILLARY: 108 mg/dL — AB (ref 65–99)

## 2014-12-22 MED ORDER — AMLODIPINE BESYLATE 5 MG PO TABS
5.0000 mg | ORAL_TABLET | Freq: Every day | ORAL | Status: DC
  Administered 2014-12-22 – 2014-12-23 (×2): 5 mg via ORAL
  Filled 2014-12-22 (×2): qty 1

## 2014-12-22 MED ORDER — SACCHAROMYCES BOULARDII 250 MG PO CAPS
250.0000 mg | ORAL_CAPSULE | Freq: Two times a day (BID) | ORAL | Status: DC
  Administered 2014-12-22 – 2014-12-23 (×3): 250 mg via ORAL
  Filled 2014-12-22 (×4): qty 1

## 2014-12-22 NOTE — Progress Notes (Addendum)
Patient ID: [name] [name], female   DOB: Aug 27, 1924, 81 y.o.   MRN: [id number] [facility] PROGRESS NOTE  [name] [name] [id number] DOB: March 31, 1924 DOA: 12/19/2014 PCP: Marjorie Smolder, MD  Brief narrative:    81 y.o. female with past medical history of left eye blindness, status post corneal transplantation in 2009, gastritis, recurrent diverticulitis, coronary artery disease, stroke. She presented to [facility] hospital 12/19/2014 with severe left lower quadrant abdominal pain started about 2 weeks prior to this admission. She was on Cipro and Flagyl since 12/14/2014 as recommended by GI doctor. She had CT abdomen 12/17/2014 which showed diverticulitis in the rectal sigmoid junction without abscess. Since pain did not improve she presented to ED for further evaluation.   Anticipated discharge: 12/23/2014.  Assessment/Plan:    Principal Problem: Left lower quadrant abdominal pain / Acute rectal sigmoid junction diverticulitis without hemorrhage  - Continue IV Cipro and flagyl for next 24 hours - Convert to PO regimen prior to discharge - Diet as tolerated - Appreciate GI input   Active Problems: CAD - Continue aspirin and plavix  - No reports of bleeding  Essential hypertension  - Continue Nebivolol 5 mg PO BID, Norvasc 2.5 mg daily  - May increase Norvasc to 5 mg if BP uncontrolled  Adverse drug reaction - Had facial flushing, jitteriness after taking hydralazine - Hydralazine labeled as allergy    DVT Prophylaxis  - SCD's bilaterally    Code Status: Full.  Family Communication:  plan of care discussed with the patient and her daughter at the bedside Disposition Plan: Home likely by 12/23/2014.  IV access:  Peripheral IV  Procedures and diagnostic studies:    Ct Abdomen Pelvis W Contrast 12/17/2014  Diverticulitis in the rectal sigmoid junction. No abscess is identified.    Medical Consultants:  Gastroenterology  Other Consultants:  None   IAnti-Infectives:    Cipro 12/19/2014 --> Flagyl 12/20/2014 -->   Leisa Lenz, MD  [facility] Pager [phone]  Time spent in minutes: 15 minutes  If 7PM-7AM, please contact night-coverage [email or web address] Password [email or web address] 12/22/2014, 12:08 PM   LOS: 3 days    HPI/Subjective: No acute overnight events. Feels better. Had flushing in the face after hydralazine yesterday.   Objective: Filed Vitals:   12/21/14 2120 12/22/14 0033 12/22/14 0520 12/22/14 0806  BP: 159/66 138/60 150/63 158/120  Pulse: 68 68 62 62  Temp: 98 F (36.7 C)  97.6 F (36.4 C) 97.9 F (36.6 C)  TempSrc: Oral  Oral Oral  Resp: 16  18   SpO2: 95%  99% 97%    Intake/Output Summary (Last 24 hours) at 12/22/14 1208 Last data filed at 12/22/14 0938  Gross per 24 hour  Intake    240 ml  Output      0 ml  Net    240 ml    Exam:   General:  Pt is alert, awake   Cardiovascular: Regular rhythm, (+) S1, S2   Respiratory: no wheezing, no rhonchi   Abdomen: (+) BS, non tender   Extremities: No edema, palpable pulses   Neuro: Nonfocal  Data Reviewed: Basic Metabolic Panel:  Recent Labs Lab 12/17/14 1350 12/19/14 1807 12/20/14 0120  NA 138 137 139  K 4.2 4.4 3.6  CL 102 102 104  CO2 28 28 29   GLUCOSE 122* 168* 107*  BUN 15 17 13   CREATININE 0.66 0.75 0.56  CALCIUM 9.2 9.3 8.8*   Liver Function Tests:  Recent Labs Lab 12/17/14 1350 12/19/14 1807  AST 41  45*  ALT 30 34  ALKPHOS 128* 124  BILITOT 0.6 0.3  PROT 6.3* 6.5  ALBUMIN 3.7 3.9    Recent Labs Lab 12/17/14 1350  LIPASE 22   No results for input(s): AMMONIA in the last 168 hours. CBC:  Recent Labs Lab 12/17/14 1350 12/19/14 1807 12/20/14 0120  WBC 5.9 6.9 5.8  HGB 13.9 14.0 13.6  HCT 42.4 42.8 42.0  MCV 96.4 95.5 95.7  PLT 182 189 180   Cardiac Enzymes: No results for input(s): CKTOTAL, CKMB, CKMBINDEX, TROPONINI in the last 168 hours. BNP: Invalid input(s): POCBNP CBG:  Recent Labs Lab 12/20/14 0756 12/21/14 0745  12/22/14 0742  GLUCAP 100* 86 108*    Recent Results (from the past 240 hour(s))  Urine culture     Status: None   Collection Time: 12/20/14  1:14 AM  Result Value Ref Range Status   Specimen Description URINE, CLEAN CATCH  Final   Special Requests NONE  Final   Culture   Final    NO GROWTH 1 DAY Performed at [facility]    Report Status 12/21/2014 FINAL  Final  C difficile quick scan w PCR reflex     Status: None   Collection Time: 12/20/14  9:42 AM  Result Value Ref Range Status   C Diff antigen NEGATIVE NEGATIVE Final   C Diff toxin NEGATIVE NEGATIVE Final   C Diff interpretation Negative for toxigenic C. difficile  Final  Stool culture     Status: None (Preliminary result)   Collection Time: 12/20/14  9:42 AM  Result Value Ref Range Status   Specimen Description STOOL  Final   Special Requests NONE  Final   Culture   Final    Culture reincubated for better growth Performed at [facility]    Report Status PENDING  Incomplete     Scheduled Meds: . amLODipine  5 mg Oral Daily  . aspirin EC  81 mg Oral Daily  . cholecalciferol  1,000 Units Oral Daily  . ciprofloxacin  400 mg Intravenous Q12H  . clopidogrel  75 mg Oral Daily  . famotidine  20 mg Oral Daily  . metronidazole  500 mg Intravenous Q8H  . multivitamin with minerals   Oral q morning - 10a  . nebivolol  5 mg Oral BID  . prednisoLONE acetate  1 drop Both Eyes BID  . saccharomyces boulardii  250 mg Oral BID  . sodium chloride  3 mL Intravenous Q12H   Continuous Infusions: . sodium chloride 125 mL/hr at 12/21/14 1402

## 2014-12-22 NOTE — Progress Notes (Signed)
     [facility] Progress Note  Subjective:  Afeb. Less abd pain. Tol diet. BP has been running high.   Objective:  Vital signs in last 24 hours: Temp:  [97.6 F (36.4 C)-98 F (36.7 C)] 97.9 F (36.6 C) (10/01 0806) Pulse Rate:  [62-68] 62 (10/01 0806) Resp:  [16-19] 18 (10/01 0520) BP: (138-188)/(60-120) 158/120 mmHg (10/01 0806) SpO2:  [95 %-99 %] 97 % (10/01 0806) Last BM Date: 12/21/14 General:   Alert,  Well-developed,    in NAD Heart:  Regular rate and rhythm; no murmurs Pulm;lungs clear Abdomen:  Soft, nontender and nondistended. Normal bowel sounds, without guarding, and without rebound.   Extremities:  Without edema. Neurologic:  Alert and  oriented x4;  grossly normal neurologically. Psych:  Alert and cooperative. Normal mood and affect.  Intake/Output from previous day:   Intake/Output this shift: Total I/O In: 240 [P.O.:240] Out: -   Lab Results:  Recent Labs  12/19/14 1807 12/20/14 0120  WBC 6.9 5.8  HGB 14.0 13.6  HCT 42.8 42.0  PLT 189 180   BMET  Recent Labs  12/19/14 1807 12/20/14 0120  NA 137 139  K 4.4 3.6  CL 102 104  CO2 28 29  GLUCOSE 168* 107*  BUN 17 13  CREATININE 0.75 0.56  CALCIUM 9.3 8.8*   LFT  Recent Labs  12/19/14 1807  PROT 6.5  ALBUMIN 3.9  AST 45*  ALT 34  ALKPHOS 124  BILITOT 0.3   PT/INR  Recent Labs  12/20/14 0120  LABPROT 14.3  INR 1.09     ASSESSMENT/PLAN:   81 year old female admitted with recurrent diverticulitis. Recent CT on September 26 shows diverticulitis in the rectal sigmoid area with no abscess. Patient currently on IV Cipro and Flagyl, can likely change to po antibiotics. Low residue diet. Avoid constipation. Would keep on florastor. [place] standpoint can likely go home tomorrow. Will arrrange for f/u in GI office.     LOS: 3 days   Hvozdovic, Deloris Ping 12/22/2014, Pager [phone]  GI Attending Note  I have personally taken an interval history, reviewed the  chart, and examined the patient.  I agree with the extender's note, impression and recommendations.  Sandy Salaam. Deatra Ina, MD, [facility] [phone]

## 2014-12-23 LAB — GLUCOSE, CAPILLARY: Glucose-Capillary: 95 mg/dL (ref 65–99)

## 2014-12-23 MED ORDER — SACCHAROMYCES BOULARDII 250 MG PO CAPS: 250.0000 mg | ORAL_CAPSULE | Freq: Two times a day (BID) | ORAL | Status: DC

## 2014-12-23 MED ORDER — FAMOTIDINE 20 MG PO TABS
20.0000 mg | ORAL_TABLET | Freq: Every day | ORAL | Status: DC
Start: 2014-12-23 — End: 2015-02-05

## 2014-12-23 MED ORDER — CIPROFLOXACIN HCL 500 MG PO TABS: 500.0000 mg | ORAL_TABLET | Freq: Two times a day (BID) | ORAL | Status: DC

## 2014-12-23 MED ORDER — METRONIDAZOLE 500 MG PO TABS: 500.0000 mg | ORAL_TABLET | Freq: Three times a day (TID) | ORAL | Status: DC

## 2014-12-23 NOTE — Discharge Instructions (Signed)
Diverticulitis Diverticulitis is when small pockets that have formed in your colon (large intestine) become infected or swollen. HOME CARE  Follow your doctor's instructions.  Follow a special diet if told by your doctor.  When you feel better, your doctor may tell you to change your diet. You may be told to eat a lot of fiber. Fruits and vegetables are good sources of fiber. Fiber makes it easier to poop (have bowel movements).  Take supplements or probiotics as told by your doctor.  Only take medicines as told by your doctor.  Keep all follow-up visits with your doctor. GET HELP IF:  Your pain does not get better.  You have a hard time eating food.  You are not pooping like normal. GET HELP RIGHT AWAY IF:  Your pain gets worse.  Your problems do not get better.  Your problems suddenly get worse.  You have a fever.  You keep throwing up (vomiting).  You have bloody or black, tarry poop (stool). MAKE SURE YOU:   Understand these instructions.  Will watch your condition.  Will get help right away if you are not doing well or get worse. Document Released: 08/26/2007 Document Revised: 03/14/2013 Document Reviewed: 02/01/2013 [facility] Patient Information 2015 [place], [place]. This information is not intended to replace advice given to you by your health care provider. Make sure you discuss any questions you have with your health care provider. Ciprofloxacin tablets What is this medicine? CIPROFLOXACIN (sip roe FLOX a sin) is a quinolone antibiotic. It is used to treat certain kinds of bacterial infections. It will not work for colds, flu, or other viral infections. This medicine may be used for other purposes; ask your health care provider or pharmacist if you have questions. COMMON BRAND NAME(S): Cipro What should I tell my health care provider before I take this medicine? They need to know if you have any of these conditions: -bone problems -cerebral disease -joint  problems -irregular heartbeat -kidney disease -liver disease -myasthenia gravis -seizure disorder -tendon problems -an unusual or allergic reaction to ciprofloxacin, other antibiotics or medicines, foods, dyes, or preservatives -pregnant or trying to get pregnant -breast-feeding How should I use this medicine? Take this medicine by mouth with a glass of water. Follow the directions on the prescription label. Take your medicine at regular intervals. Do not take your medicine more often than directed. Take all of your medicine as directed even if you think your are better. Do not skip doses or stop your medicine early. You can take this medicine with food or on an empty stomach. It can be taken with a meal that contains dairy or calcium, but do not take it alone with a dairy product, like milk or yogurt or calcium-fortified juice. A special MedGuide will be given to you by the pharmacist with each prescription and refill. Be sure to read this information carefully each time. Talk to your pediatrician regarding the use of this medicine in children. Special care may be needed. Overdosage: If you think you have taken too much of this medicine contact a poison control center or emergency room at once. NOTE: This medicine is only for you. Do not share this medicine with others. What if I miss a dose? If you miss a dose, take it as soon as you can. If it is almost time for your next dose, take only that dose. Do not take double or extra doses. What may interact with this medicine? Do not take this medicine with any of  the following medications: -cisapride -droperidol -terfenadine -tizanidine This medicine may also interact with the following medications: -antacids -birth control pills -caffeine -cyclosporin -didanosine (ddI) buffered tablets or powder -medicines for diabetes -medicines for inflammation like ibuprofen,  naproxen -methotrexate -multivitamins -omeprazole -phenytoin -probenecid -sucralfate -theophylline -warfarin This list may not describe all possible interactions. Give your health care provider a list of all the medicines, herbs, non-prescription drugs, or dietary supplements you use. Also tell them if you smoke, drink alcohol, or use illegal drugs. Some items may interact with your medicine. What should I watch for while using this medicine? Tell your doctor or health care professional if your symptoms do not improve. Do not treat diarrhea with over the counter products. Contact your doctor if you have diarrhea that lasts more than 2 days or if it is severe and watery. You may get drowsy or dizzy. Do not drive, use machinery, or do anything that needs mental alertness until you know how this medicine affects you. Do not stand or sit up quickly, especially if you are an older patient. This reduces the risk of dizzy or fainting spells. This medicine can make you more sensitive to the sun. Keep out of the sun. If you cannot avoid being in the sun, wear protective clothing and use sunscreen. Do not use sun lamps or tanning beds/booths. Avoid antacids, aluminum, calcium, iron, magnesium, and zinc products for 6 hours before and 2 hours after taking a dose of this medicine. What side effects may I notice from receiving this medicine? Side effects that you should report to your doctor or health care professional as soon as possible: - allergic reactions like skin rash, itching or hives, swelling of the face, lips, or tongue - breathing problems - confusion, nightmares or hallucinations - feeling faint or lightheaded, falls - irregular heartbeat - joint, muscle or tendon pain or swelling - pain or trouble passing urine -persistent headache with or without blurred vision - redness, blistering, peeling or loosening of the skin, including inside the mouth - seizure - unusual pain,  numbness, tingling, or weakness Side effects that usually do not require medical attention (report to your doctor or health care professional if they continue or are bothersome): - diarrhea - nausea or stomach upset - white patches or sores in the mouth This list may not describe all possible side effects. Call your doctor for medical advice about side effects. You may report side effects to FDA at 1-800-FDA-1088. Where should I keep my medicine? Keep out of the reach of children. Store at room temperature below 30 degrees C (86 degrees F). Keep container tightly closed. Throw away any unused medicine after the expiration date. NOTE: This sheet is a summary. It may not cover all possible information. If you have questions about this medicine, talk to your doctor, pharmacist, or health care provider.  2015, [employer] Standard. (2012-10-13 16:10:46) Metronidazole tablets or capsules What is this medicine? METRONIDAZOLE (me troe NI da zole) is an antiinfective. It is used to treat certain kinds of bacterial and protozoal infections. It will not work for colds, flu, or other viral infections. This medicine may be used for other purposes; ask your health care provider or pharmacist if you have questions. COMMON BRAND NAME(S): Flagyl What should I tell my health care provider before I take this medicine? They need to know if you have any of these conditions: -anemia or other blood disorders -disease of the nervous system -fungal or yeast infection -if you drink alcohol containing drinks -  liver disease -seizures -an unusual or allergic reaction to metronidazole, or other medicines, foods, dyes, or preservatives -pregnant or trying to get pregnant -breast-feeding How should I use this medicine? Take this medicine by mouth with a full glass of water. Follow the directions on the prescription label. Take your medicine at regular intervals. Do not take your medicine more often than directed.  Take all of your medicine as directed even if you think you are better. Do not skip doses or stop your medicine early. Talk to your pediatrician regarding the use of this medicine in children. Special care may be needed. Overdosage: If you think you have taken too much of this medicine contact a poison control center or emergency room at once. NOTE: This medicine is only for you. Do not share this medicine with others. What if I miss a dose? If you miss a dose, take it as soon as you can. If it is almost time for your next dose, take only that dose. Do not take double or extra doses. What may interact with this medicine? Do not take this medicine with any of the following medications: -alcohol or any product that contains alcohol -amprenavir oral solution -cisapride -disulfiram -dofetilide -dronedarone -paclitaxel injection -pimozide -ritonavir oral solution -sertraline oral solution -sulfamethoxazole-trimethoprim injection -thioridazine -ziprasidone This medicine may also interact with the following medications: -birth control pills -cimetidine -lithium -other medicines that prolong the QT interval (cause an abnormal heart rhythm) -phenobarbital -phenytoin -warfarin This list may not describe all possible interactions. Give your health care provider a list of all the medicines, herbs, non-prescription drugs, or dietary supplements you use. Also tell them if you smoke, drink alcohol, or use illegal drugs. Some items may interact with your medicine. What should I watch for while using this medicine? Tell your doctor or health care professional if your symptoms do not improve or if they get worse. You may get drowsy or dizzy. Do not drive, use machinery, or do anything that needs mental alertness until you know how this medicine affects you. Do not stand or sit up quickly, especially if you are an older patient. This reduces the risk of dizzy or fainting spells. Avoid alcoholic drinks  while you are taking this medicine and for three days afterward. Alcohol may make you feel dizzy, sick, or flushed. If you are being treated for a sexually transmitted disease, avoid sexual contact until you have finished your treatment. Your sexual partner may also need treatment. What side effects may I notice from receiving this medicine? Side effects that you should report to your doctor or health care professional as soon as possible: -allergic reactions like skin rash or hives, swelling of the face, lips, or tongue -confusion, clumsiness -difficulty speaking -discolored or sore mouth -dizziness -fever, infection -numbness, tingling, pain or weakness in the hands or feet -trouble passing urine or change in the amount of urine -redness, blistering, peeling or loosening of the skin, including inside the mouth -seizures -unusually weak or tired -vaginal irritation, dryness, or discharge Side effects that usually do not require medical attention (report to your doctor or health care professional if they continue or are bothersome): -diarrhea -headache -irritability -metallic taste -nausea -stomach pain or cramps -trouble sleeping This list may not describe all possible side effects. Call your doctor for medical advice about side effects. You may report side effects to FDA at 1-800-FDA-1088. Where should I keep my medicine? Keep out of the reach of children. Store at room temperature below 25 degrees C (  77 degrees F). Protect from light. Keep container tightly closed. Throw away any unused medicine after the expiration date. NOTE: This sheet is a summary. It may not cover all possible information. If you have questions about this medicine, talk to your doctor, pharmacist, or health care provider.  2015, [employer] Standard. (2012-10-14 14:08:39) Saccharomyces boulardii (Florastor) oral dosage forms What is this medicine? SACCHAROMYCES boulardii (SAK a roe MYE sees boo LAR dee eye)  is a supplement. It is used to help the normal balance of bacteria in the colon. The FDA has not approved this supplement for any medical use. This medicine may be used for other purposes; ask your health care provider or pharmacist if you have questions. COMMON BRAND NAME(S): Florastor, Florastor Kids What should I tell my health care provider before I take this medicine? They need to know if you have any of these conditions: -chronic disease -have a central venous catheter -immune system problems -an unusual or allergic reaction to Saccharomyces boulardii, Brewer's yeast or other yeast, lactose or milk (Florastor brand contains lactose), other foods, dyes, or preservatives -pregnant or trying to get pregnant -breast-feeding How should I use this medicine? Take this medicine by mouth. Follow the directions on the package labeling, or take as directed by your health care professional. Do not take this medicine more often than directed. Capsules: Swallow capsules whole or mix contents of capsule with water, apple juice or other non-carbonated drinks. Capsule contents can also be added to apple sauce and other soft foods. Do not add to hot beverages. Granules: Mix contents of packet with water, apple juice, or other non-carbonated drinks. For babies, may add to formula after warmed. Can also be added to apple sauce and other soft baby foods. Do not add to hot beverages. Chewable tablets: Chew tablet completely before swallowing. Can be crushed and sprinkled on tongue. Can also be added to water, apple juice, or other non-carbonated drinks or apple sauce or other soft foods. Do not add to hot beverages. Contact your pediatrician regarding the use of this medicine in children. Special care may be needed. This medicine is not recommended for children or infants unless prescribed by a doctor. Overdosage: If you think you have taken too much of this medicine contact a poison control center or emergency  room at once. NOTE: This medicine is only for you. Do not share this medicine with others. What if I miss a dose? If you miss a dose, take it as soon as you can. If it is almost time for your next dose, take only that dose. Do not take double or extra doses. What may interact with this medicine? -medications for fungal infections, such as fluconazole, itraconazole, ketoconazole, nystatin, or voriconazole. This list may not describe all possible interactions. Give your health care provider a list of all the medicines, herbs, non-prescription drugs, or dietary supplements you use. Also tell them if you smoke, drink alcohol, or use illegal drugs. Some items may interact with your medicine. What should I watch for while using this medicine? See your doctor if your symptoms do not get better or if they get worse. If you have allergies to milk or you are sensitive to lactose, do not use the Florastor brand supplement. Florastor contains lactose. What side effects may I notice from receiving this medicine? Side effects that you should report to your doctor or health care professional as soon as possible: -allergic reactions like skin rash, itching or hives, swelling of the face, lips, or  tongue -breathing problems -fever -nausea, vomiting -unusually weak or tired Side effects that usually do not require medical attention (report to your doctor or health care professional if they continue or are bothersome): -constipation -increased thirst -mild gas This list may not describe all possible side effects. Call your doctor for medical advice about side effects. You may report side effects to FDA at 1-800-FDA-1088. Where should I keep my medicine? Keep out of the reach of children. Store at room temperature under 25 degrees C (77 degrees F). Keep granule packets or capsules closed until time of use. Throw away any unused medicine after the expiration date. NOTE: This sheet is a summary. It may not cover  all possible information. If you have questions about this medicine, talk to your doctor, pharmacist, or health care provider.  2015, [employer]vier/Gold Standard. (2010-09-08 13:10:58)

## 2014-12-23 NOTE — Discharge Summary (Addendum)
Physician Discharge Summary  [name] [name] CBS:[id number] DOB: 24-Jun-1924 DOA: 12/19/2014  PCP: Marjorie Smolder, MD  Admit date: 12/19/2014 Discharge date: 12/23/2014  Recommendations for Outpatient Follow-up:  Please take cipro and flagyl for 6 more days on discharge. We have documented as hydralazine as an allergy. Please inform your PCP of this.  Discharge Diagnoses:  Principal Problem:   Diverticulitis Active Problems:   OA (osteoarthritis) of knee   LLQ abdominal pain   History of stroke   UTI (lower urinary tract infection)   Essential hypertension   CAD (coronary artery disease)    Discharge Condition: stable   Diet recommendation: as tolerated   History of present illness:  81 y.o. female with past medical history of left eye blindness, status post corneal transplantation in 2009, gastritis, recurrent diverticulitis, coronary artery disease, stroke. She presented to [facility] 12/19/2014 with severe left lower quadrant abdominal pain started about 2 weeks prior to this admission. She was on Cipro and Flagyl since 12/14/2014 as recommended by GI doctor. She had CT abdomen 12/17/2014 which showed diverticulitis in the rectal sigmoid junction without abscess. Since pain did not improve she presented to ED for further evaluation. GI has seen her in consultation. Recommended conservative management with cipro and flagyl.   Hospital Course:    Assessment/Plan:    Principal Problem: Left lower quadrant abdominal pain / Acute rectal sigmoid junction diverticulitis without hemorrhage  - Continue Cipro and flagyl for 6 more days on discharge - Continue florastor BID on discharge - Tolerated regular diet - Pain is controlled.   Active Problems: CAD - Continue aspirin and plavix   Essential hypertension  - Continue Nebivolol 5 mg PO BID, Norvasc 2.5 mg daily   Adverse drug reaction - Has had facial flushing, jitteriness after taking hydralazine - Hydralazine  labeled as allergy    DVT Prophylaxis  - SCD's bilaterally in hospital    Code Status: Full.  Family Communication: plan of care discussed with the patient and her daughter at the bedside   IV access:  Peripheral IV  Procedures and diagnostic studies:   Ct Abdomen Pelvis W Contrast 12/17/2014 Diverticulitis in the rectal sigmoid junction. No abscess is identified.   Medical Consultants:  Gastroenterology  Other Consultants:  None   IAnti-Infectives:   Cipro 12/19/2014 --> Flagyl 12/20/2014 -->    Signed:  Leisa Lenz, MD  [facility] 12/23/2014, 9:18 AM  Pager #: [phone]  Time spent in minutes: more than 30 minutes  Discharge Exam: Filed Vitals:   12/23/14 0606  BP: 157/55  Pulse: 61  Temp: 97.8 F (36.6 C)  Resp: 17   Filed Vitals:   12/22/14 0806 12/22/14 1300 12/22/14 2150 12/23/14 0606  BP: 158/120 146/54 167/56 157/55  Pulse: 62 63 62 61  Temp: 97.9 F (36.6 C) 98 F (36.7 C) 97.7 F (36.5 C) 97.8 F (36.6 C)  TempSrc: Oral Oral Oral Oral  Resp:   18 17  SpO2: 97% 95% 98% 96%    General: Pt is alert, follows commands appropriately, not in acute distress Cardiovascular: Regular rate and rhythm, S1/S2 + Respiratory: Clear to auscultation bilaterally, no wheezing, no crackles, no rhonchi Abdominal: Soft, non tender, non distended, bowel sounds +, no guarding Extremities: no edema, no cyanosis, pulses palpable bilaterally DP and PT Neuro: Grossly nonfocal  Discharge Instructions  Discharge Instructions    Call MD for:  difficulty breathing, headache or visual disturbances    Complete by:  As directed  Call MD for:  persistant nausea and vomiting    Complete by:  As directed      Call MD for:  severe uncontrolled pain    Complete by:  As directed      Diet - low sodium heart healthy    Complete by:  As directed      Discharge instructions    Complete by:  As directed   Please take cipro and flagyl for  6 more days on discharge. We have documented as hydralazine as an allergy. Please inform your PCP of this.     Increase activity slowly    Complete by:  As directed             Medication List    TAKE these medications        amLODipine 2.5 MG tablet  Commonly known as:  NORVASC  Take 2.5 mg by mouth daily as needed.     aspirin 81 MG EC tablet  Take 1 tablet (81 mg total) by mouth daily.     BYSTOLIC 5 MG tablet  Generic drug:  nebivolol  take 1 tablet by mouth twice a day     cholecalciferol 1000 UNITS tablet  Commonly known as:  VITAMIN D  Take 1,000 Units by mouth daily.     ciprofloxacin 500 MG tablet  Commonly known as:  CIPRO  Take 1 tablet (500 mg total) by mouth 2 (two) times daily. 28     clopidogrel 75 MG tablet  Commonly known as:  PLAVIX  take 1 tablet by mouth once daily     famotidine 20 MG tablet  Commonly known as:  PEPCID  Take 1 tablet (20 mg total) by mouth daily.     hyoscyamine 0.125 MG SL tablet  Commonly known as:  LEVSIN SL  Place 1 tablet (0.125 mg total) under the tongue every 4 (four) hours as needed.     Melatonin 1 MG Subl  Place 1 mg under the tongue at bedtime.     metroNIDAZOLE 500 MG tablet  Commonly known as:  FLAGYL  Take 1 tablet (500 mg total) by mouth 3 (three) times daily.     MULTI VITAMIN DAILY PO  Take 1 tablet by mouth every morning.     nitroGLYCERIN 0.4 MG SL tablet  Commonly known as:  NITROSTAT  Place 1 tablet (0.4 mg total) under the tongue every 5 (five) minutes as needed for chest pain.     prednisoLONE acetate 1 % ophthalmic suspension  Commonly known as:  PRED FORTE  Place 1 drop into both eyes 2 (two) times daily.     ranitidine 150 MG tablet  Commonly known as:  ZANTAC  Take 1 tablet (150 mg total) by mouth at bedtime.     saccharomyces boulardii 250 MG capsule  Commonly known as:  FLORASTOR  Take 1 capsule (250 mg total) by mouth 2 (two) times daily.           Follow-up Information     Follow up with Marjorie Smolder, MD. Schedule an appointment as soon as possible for a visit in 1 week.   Specialty:  Family Medicine   Why:  Follow up appt after recent hospitalization   Contact information:   [place] [address] [phone]        The results of significant diagnostics from this hospitalization (including imaging, microbiology, ancillary and laboratory) are listed below for reference.    Significant  Diagnostic Studies: Ct Abdomen Pelvis W Contrast  12/17/2014   CLINICAL DATA:  Left lower quadrant abdomen pain for 2 weeks  EXAM: CT ABDOMEN AND PELVIS WITH CONTRAST  TECHNIQUE: Multidetector CT imaging of the abdomen and pelvis was performed using the standard protocol following bolus administration of intravenous contrast.  CONTRAST:  122mL OMNIPAQUE IOHEXOL 300 MG/ML  SOLN  COMPARISON:  November 08, 2014  FINDINGS: The liver, spleen, pancreas, adrenal glands and kidneys are normal. There is mild dilatation of the right ureter without focal obstructing stone identified. Patient is status post prior cholecystectomy. There is atherosclerosis of the abdominal aorta without aneurysmal dilatation. There is no abdominal lymphadenopathy.  There is no small bowel obstruction. The appendix is normal. There is stranding and inflammation surrounding the rectal sigmoid junction with thickening of the rectal sigmoid colonic wall. There is diverticulosis of colon.  Images of the pelvis demonstrate fluid-filled bladder without abnormality. The patient is status post prior cholecystectomy. The lung bases demonstrate no focal pneumonia or pleural effusion. Degenerative joint changes of the spine are noted.  IMPRESSION: Diverticulitis in the rectal sigmoid junction. No abscess is identified.   Electronically Signed   By: Abelardo Diesel M.D.   On: 12/17/2014 18:56    Microbiology: Recent Results (from the past 240 hour(s))  Urine culture     Status: None    Collection Time: 12/20/14  1:14 AM  Result Value Ref Range Status   Specimen Description URINE, CLEAN CATCH  Final   Special Requests NONE  Final   Culture   Final    NO GROWTH 1 DAY Performed at [facility]    Report Status 12/21/2014 FINAL  Final  C difficile quick scan w PCR reflex     Status: None   Collection Time: 12/20/14  9:42 AM  Result Value Ref Range Status   C Diff antigen NEGATIVE NEGATIVE Final   C Diff toxin NEGATIVE NEGATIVE Final   C Diff interpretation Negative for toxigenic C. difficile  Final  Stool culture     Status: None (Preliminary result)   Collection Time: 12/20/14  9:42 AM  Result Value Ref Range Status   Specimen Description STOOL  Final   Special Requests NONE  Final   Culture   Final    NO SUSPICIOUS COLONIES, CONTINUING TO HOLD Performed at [facility]    Report Status PENDING  Incomplete     Labs: Basic Metabolic Panel:  Recent Labs Lab 12/17/14 1350 12/19/14 1807 12/20/14 0120  NA 138 137 139  K 4.2 4.4 3.6  CL 102 102 104  CO2 28 28 29   GLUCOSE 122* 168* 107*  BUN 15 17 13   CREATININE 0.66 0.75 0.56  CALCIUM 9.2 9.3 8.8*   Liver Function Tests:  Recent Labs Lab 12/17/14 1350 12/19/14 1807  AST 41 45*  ALT 30 34  ALKPHOS 128* 124  BILITOT 0.6 0.3  PROT 6.3* 6.5  ALBUMIN 3.7 3.9    Recent Labs Lab 12/17/14 1350  LIPASE 22   No results for input(s): AMMONIA in the last 168 hours. CBC:  Recent Labs Lab 12/17/14 1350 12/19/14 1807 12/20/14 0120  WBC 5.9 6.9 5.8  HGB 13.9 14.0 13.6  HCT 42.4 42.8 42.0  MCV 96.4 95.5 95.7  PLT 182 189 180   Cardiac Enzymes: No results for input(s): CKTOTAL, CKMB, CKMBINDEX, TROPONINI in the last 168 hours. BNP: BNP (last 3 results) No results for input(s): BNP in the last 8760 hours.  ProBNP (  last 3 results) No results for input(s): PROBNP in the last 8760 hours.  CBG:  Recent Labs Lab 12/20/14 0756 12/21/14 0745 12/22/14 0742 12/23/14 0744   GLUCAP 100* 86 108* 95

## 2014-12-23 NOTE — Progress Notes (Signed)
[name] [name] to be D/C'd Home per MD order.  Discussed prescriptions and follow up appointments with the patient. Prescriptions given to patient, medication list explained in detail. Pt verbalized understanding.    Medication List    TAKE these medications        amLODipine 2.5 MG tablet  Commonly known as:  NORVASC  Take 2.5 mg by mouth daily as needed.     aspirin 81 MG EC tablet  Take 1 tablet (81 mg total) by mouth daily.     BYSTOLIC 5 MG tablet  Generic drug:  nebivolol  take 1 tablet by mouth twice a day     cholecalciferol 1000 UNITS tablet  Commonly known as:  VITAMIN D  Take 1,000 Units by mouth daily.     ciprofloxacin 500 MG tablet  Commonly known as:  CIPRO  Take 1 tablet (500 mg total) by mouth 2 (two) times daily. 28     clopidogrel 75 MG tablet  Commonly known as:  PLAVIX  take 1 tablet by mouth once daily     famotidine 20 MG tablet  Commonly known as:  PEPCID  Take 1 tablet (20 mg total) by mouth daily.     hyoscyamine 0.125 MG SL tablet  Commonly known as:  LEVSIN SL  Place 1 tablet (0.125 mg total) under the tongue every 4 (four) hours as needed.     Melatonin 1 MG Subl  Place 1 mg under the tongue at bedtime.     metroNIDAZOLE 500 MG tablet  Commonly known as:  FLAGYL  Take 1 tablet (500 mg total) by mouth 3 (three) times daily.     MULTI VITAMIN DAILY PO  Take 1 tablet by mouth every morning.     nitroGLYCERIN 0.4 MG SL tablet  Commonly known as:  NITROSTAT  Place 1 tablet (0.4 mg total) under the tongue every 5 (five) minutes as needed for chest pain.     prednisoLONE acetate 1 % ophthalmic suspension  Commonly known as:  PRED FORTE  Place 1 drop into both eyes 2 (two) times daily.     ranitidine 150 MG tablet  Commonly known as:  ZANTAC  Take 1 tablet (150 mg total) by mouth at bedtime.     saccharomyces boulardii 250 MG capsule  Commonly known as:  FLORASTOR  Take 1 capsule (250 mg total) by mouth 2 (two) times daily.         Filed Vitals:   12/23/14 0606  BP: 157/55  Pulse: 61  Temp: 97.8 F (36.6 C)  Resp: 17    Skin clean, dry and intact without evidence of skin break down, no evidence of skin tears noted. IV catheter discontinued intact. Site without signs and symptoms of complications. Dressing and pressure applied. Pt denies pain at this time. No complaints noted.  An After Visit Summary was printed and given to the patient. Patient escorted via [place], and D/C home via private auto.  [name] S 12/23/2014 11:05 AM

## 2014-12-24 ENCOUNTER — Telehealth: Payer: Self-pay | Admitting: Gastroenterology

## 2014-12-24 ENCOUNTER — Encounter: Payer: Self-pay | Admitting: *Deleted

## 2014-12-24 LAB — STOOL CULTURE

## 2014-12-24 NOTE — Telephone Encounter (Signed)
Spoke with patient's daughter and told her GI notes recommend a low residue diet and Florastor.

## 2014-12-25 NOTE — ED Provider Notes (Signed)
CSN: [id number]     Arrival date & time 12/19/14  1744 History   First MD Initiated Contact with Patient 12/19/14 1816     Chief Complaint  Patient presents with  . Diverticulitis  . Blood In Stools     (Consider location/radiation/quality/duration/timing/severity/associated sxs/prior Treatment) HPI Patient presents to the emergency department with diverticulitis.  The patient was placed on antibiotics and states that she noticed blood in her stool this morning.  The patient states that she called her GI doctor who advised her to come to the hospital for evaluation.  Patient states that she is having same amount of pain, but feels like that she has not gotten any better after 1-1/2 days worth of antibiotics.  Patient, states she has had no nausea, vomiting, weakness, dizziness, headache, blurred vision, back pain, neck pain, fever, cough, rhinitis, sore throat, or syncope.  The patient states palpation makes her pain worse Past Medical History  Diagnosis Date  . Arthritis   . Blind left eye     Corneal transplant x3 with rejection  . Dysrhythmia   . Varicose veins     both legs and left fooot at arch  . Complication of anesthesia     sensitive to meds, bp can fall  . SVT (supraventricular tachycardia) 12/15/2011    seen by Dr. Caryl Comes  . Helicobacter pylori (H. pylori) 05/22/02    RUT-Positive  . Gastritis   . Diverticulosis   . Stroke   . Irregular heartbeat   . History of stroke 06/14/2014   Past Surgical History  Procedure Laterality Date  . Cholecystectomy    . Tonsillectomy    . Tonsillectomy    . Corneal transplant  right 2009, left 2009    both eyes, 3 in left eye, 1 in right eye  . Cataract surgery  2005    both eyes  . Abdominal hysterectomy   sept 1985    ovaries removed  . Total knee arthroplasty Left 06/27/2012    Procedure: LEFT TOTAL KNEE ARTHROPLASTY;  Surgeon: Gearlean Alf, MD;  Location: [facility];  Service: Orthopedics;  Laterality: Left;  . Tubal ligation     . Total knee arthroplasty Right 12/12/2012    Procedure: RIGHT TOTAL KNEE ARTHROPLASTY;  Surgeon: Gearlean Alf, MD;  Location: [facility];  Service: Orthopedics;  Laterality: Right;  . Loop recorder implant N/A 01/05/2014    Procedure: LOOP RECORDER IMPLANT;  Surgeon: Coralyn Mark, MD;  Location: [facility];  Service: Cardiovascular;  Laterality: N/A;   Family History  Problem Relation Age of Onset  . Stroke Mother   . Heart attack Father   . Heart attack Brother   . Cancer Brother   . Heart attack Brother   . Heart attack Brother   . Heart attack Brother   . Parkinsonism Brother    Social History  Substance Use Topics  . Smoking status: Never Smoker   . Smokeless tobacco: Never Used  . Alcohol Use: No   OB History    No data available     Review of Systems  All other systems negative except as documented in the HPI. All pertinent positives and negatives as reviewed in the HPI.  Allergies  Ultram; Buprenex; Keflex; Other; Augmentin; Hydralazine hcl; Levofloxacin; Percocet; Sulfa antibiotics; Tape; and Zetia  Home Medications   Prior to Admission medications   Medication Sig Start Date End Date Taking? Authorizing Provider  aspirin EC 81 MG EC tablet Take 1 tablet (81  mg total) by mouth daily. 01/22/14  Yes Geradine Girt, DO  BYSTOLIC 5 MG tablet take 1 tablet by mouth twice a day 07/17/14  Yes [name], MD  cholecalciferol (VITAMIN D) 1000 UNITS tablet Take 1,000 Units by mouth daily.   Yes Historical Provider, MD  clopidogrel (PLAVIX) 75 MG tablet take 1 tablet by mouth once daily Patient taking differently: Take 1 tablet by mouth once daily 12/10/14  Yes [name], MD  Melatonin 1 MG SUBL Place 1 mg under the tongue at bedtime.   Yes Historical Provider, MD  Multiple Vitamin (MULTI VITAMIN DAILY PO) Take 1 tablet by mouth every morning.   Yes Historical Provider, MD  prednisoLONE acetate (PRED FORTE) 1 % ophthalmic suspension Place 1 drop into both eyes 2  (two) times daily.    Yes Historical Provider, MD  ranitidine (ZANTAC) 150 MG tablet Take 1 tablet (150 mg total) by mouth at bedtime. 11/07/14  Yes Lafayette Dragon, MD  amLODipine (NORVASC) 2.5 MG tablet Take 2.5 mg by mouth daily as needed.    Historical Provider, MD  ciprofloxacin (CIPRO) 500 MG tablet Take 1 tablet (500 mg total) by mouth 2 (two) times daily. 28 12/23/14   Robbie Lis, MD  famotidine (PEPCID) 20 MG tablet Take 1 tablet (20 mg total) by mouth daily. 12/23/14   Robbie Lis, MD  hyoscyamine (LEVSIN SL) 0.125 MG SL tablet Place 1 tablet (0.125 mg total) under the tongue every 4 (four) hours as needed. 11/06/14   Lafayette Dragon, MD  metroNIDAZOLE (FLAGYL) 500 MG tablet Take 1 tablet (500 mg total) by mouth 3 (three) times daily. 12/23/14   Robbie Lis, MD  nitroGLYCERIN (NITROSTAT) 0.4 MG SL tablet Place 1 tablet (0.4 mg total) under the tongue every 5 (five) minutes as needed for chest pain. 02/05/14   Imogene Burn, PA-C  saccharomyces boulardii (FLORASTOR) 250 MG capsule Take 1 capsule (250 mg total) by mouth 2 (two) times daily. 12/23/14   Robbie Lis, MD   BP 157/55 mmHg  Pulse 61  Temp(Src) 97.8 F (36.6 C) (Oral)  Resp 17  SpO2 96% Physical Exam  Constitutional: She is oriented to person, place, and time. She appears well-developed and well-nourished. No distress.  HENT:  Head: Normocephalic and atraumatic.  Mouth/Throat: Oropharynx is clear and moist.  Eyes: Pupils are equal, round, and reactive to light.  Neck: Normal range of motion. Neck supple.  Cardiovascular: Normal rate, regular rhythm and normal heart sounds.  Exam reveals no gallop and no friction rub.   No murmur heard. Pulmonary/Chest: Effort normal and breath sounds normal. No respiratory distress.  Abdominal: Soft. Bowel sounds are normal. She exhibits no distension. There is tenderness. There is no rebound and no guarding.  Neurological: She is alert and oriented to person, place, and time. She  exhibits normal muscle tone. Coordination normal.  Skin: Skin is warm and dry. No rash noted. No erythema.  Nursing note and vitals reviewed.   ED Course  Procedures (including critical care time) Labs Review Labs Reviewed  COMPREHENSIVE METABOLIC PANEL - Abnormal; Notable for the following:    Glucose, Bld 168 (*)    AST 45 (*)    All other components within normal limits  URINALYSIS, ROUTINE W REFLEX MICROSCOPIC (NOT AT [facility]) - Abnormal; Notable for the following:    APPearance CLOUDY (*)    Leukocytes, UA LARGE (*)    All other components within normal limits  URINE MICROSCOPIC-ADD  ON - Abnormal; Notable for the following:    Squamous Epithelial / LPF FEW (*)    All other components within normal limits  BASIC METABOLIC PANEL - Abnormal; Notable for the following:    Glucose, Bld 107 (*)    Calcium 8.8 (*)    All other components within normal limits  GLUCOSE, CAPILLARY - Abnormal; Notable for the following:    Glucose-Capillary 100 (*)    All other components within normal limits  GLUCOSE, CAPILLARY - Abnormal; Notable for the following:    Glucose-Capillary 108 (*)    All other components within normal limits  C DIFFICILE QUICK SCREEN W PCR REFLEX  STOOL CULTURE  URINE CULTURE  CBC  PROTIME-INR  APTT  CBC  GLUCOSE, CAPILLARY  GLUCOSE, CAPILLARY    Imaging Review No results found. I have personally reviewed and evaluated these images and lab results as part of my medical decision-making.  I spoke with GI and the [facility] and there will both evaluate the patient.  Patient is stable and being admitted to the hospital, IV Flagyl and Cipro been given do not know that you can call her outpatient treatment failure as she only had 1-1/2 days worth of treatment MDM   Final diagnoses:  Diverticulitis of large intestine without perforation or abscess without bleeding        Dalia Heading, PA-C 12/26/14 1611  Milton Ferguson, MD 12/27/14 1456

## 2014-12-27 ENCOUNTER — Encounter: Payer: Self-pay | Admitting: Internal Medicine

## 2014-12-31 ENCOUNTER — Ambulatory Visit (INDEPENDENT_AMBULATORY_CARE_PROVIDER_SITE_OTHER): Payer: Medicare Other | Admitting: *Deleted

## 2014-12-31 DIAGNOSIS — I639 Cerebral infarction, unspecified: Secondary | ICD-10-CM | POA: Diagnosis not present

## 2015-01-01 NOTE — Progress Notes (Signed)
Cardiology Office Note   Date:  01/02/2015   ID:  [name] [name], DOB June 18, 1924, MRN [id number]  PCP:  Marjorie Smolder, MD  Cardiologist:  Dr. [name]   Electrophysiologist:  Dr. Virl Axe   Chief Complaint  Patient presents with  . Hospitalization Follow-up  . Coronary Artery Disease     History of Present Illness: [name] [name] is a 81 y.o. female with a hx of CAD, carotid stenosis, palpitations, PSVT, prior CVA.  She was admitted in 2015 with NSTEMI. She was treated conservatively and a nuclear stress test demonstrated no scar or ischemia.  EF was preserved. She was treated with ASA + Plavix.  She is s/p ILR.  Last seen by Dr. [name] 12/13/14.  Patient did complain of generalized weakness and she was asked to monitor VS when this occurs.  She was then admitted 9/28-10/2 with diverticulitis.  She was treated with a combination of Cipro and Flagyl.    She returns today with her daughter.  Her BP has been somewhat labile since DC from the hospital.  It has dropped into the 110-120 range.  She generally feels bad/weak with this.  She denies syncope or near syncope. She denies chest pain, significant dyspnea. She denies orthopnea, PND, significant edema.  She has a good appetite. She denies vomiting, diarrhea, melena, hematochezia.     Studies/Reports Reviewed Today:  Myoview 11/15 1. No reversible ischemia or infarction. 2. Normal left ventricular wall motion. 3. Left ventricular ejection fraction 74% 4. Low-risk stress test findings*.  Echo 11/15 Normal LVF, no RWMA, mild AI  Carotid US 10/15 Bilateral ICA 1-39%  Abd [place] 6/14 No AAA    Past Medical History  Diagnosis Date  . Arthritis   . Blind left eye     Corneal transplant x3 with rejection  . Dysrhythmia   . Varicose veins     both legs and left fooot at arch  . Complication of anesthesia     sensitive to meds, bp can fall  . SVT (supraventricular tachycardia) ([place]) 12/15/2011    seen by  Dr. Caryl Comes  . Helicobacter pylori (H. pylori) 05/22/02    RUT-Positive  . Gastritis   . Diverticulosis   . Stroke ([place])   . Irregular heartbeat   . History of stroke 06/14/2014    Past Surgical History  Procedure Laterality Date  . Cholecystectomy    . Tonsillectomy    . Tonsillectomy    . Corneal transplant  right 2009, left 2009    both eyes, 3 in left eye, 1 in right eye  . Cataract surgery  2005    both eyes  . Abdominal hysterectomy   sept 1985    ovaries removed  . Total knee arthroplasty Left 06/27/2012    Procedure: LEFT TOTAL KNEE ARTHROPLASTY;  Surgeon: Gearlean Alf, MD;  Location: [facility];  Service: Orthopedics;  Laterality: Left;  . Tubal ligation    . Total knee arthroplasty Right 12/12/2012    Procedure: RIGHT TOTAL KNEE ARTHROPLASTY;  Surgeon: Gearlean Alf, MD;  Location: [facility];  Service: Orthopedics;  Laterality: Right;  . Loop recorder implant N/A 01/05/2014    Procedure: LOOP RECORDER IMPLANT;  Surgeon: Coralyn Mark, MD;  Location: [facility];  Service: Cardiovascular;  Laterality: N/A;     Current Outpatient Prescriptions  Medication Sig Dispense Refill  . amLODipine (NORVASC) 2.5 MG tablet Take 2.5 mg by mouth daily as needed (BLOOD PRESSURE).     Marland Kitchen  aspirin EC 81 MG EC tablet Take 1 tablet (81 mg total) by mouth daily.    . cholecalciferol (VITAMIN D) 1000 UNITS tablet Take 1,000 Units by mouth daily.    . clopidogrel (PLAVIX) 75 MG tablet take 1 tablet by mouth once daily (Patient taking differently: Take 1 tablet by mouth once daily) 30 tablet 0  . famotidine (PEPCID) 20 MG tablet Take 1 tablet (20 mg total) by mouth daily. 30 tablet 0  . hyoscyamine (LEVSIN SL) 0.125 MG SL tablet Place 1 tablet (0.125 mg total) under the tongue every 4 (four) hours as needed. (Patient taking differently: Place 0.125 mg under the tongue every 4 (four) hours as needed (STOMACH CRAMPS). ) 30 tablet 0  . Melatonin 1 MG SUBL Place 1 mg under the tongue at bedtime.    .  Multiple Vitamin (MULTI VITAMIN DAILY PO) Take 1 tablet by mouth every morning.    . nebivolol (BYSTOLIC) 5 MG tablet Take 1 tablet (5 mg total) by mouth every morning.  6  . nitroGLYCERIN (NITROSTAT) 0.4 MG SL tablet Place 1 tablet (0.4 mg total) under the tongue every 5 (five) minutes as needed for chest pain. 25 tablet 3  . prednisoLONE acetate (PRED FORTE) 1 % ophthalmic suspension Place 1 drop into both eyes 2 (two) times daily.     . ranitidine (ZANTAC) 150 MG tablet Take 1 tablet (150 mg total) by mouth at bedtime. 30 tablet 3  . saccharomyces boulardii (FLORASTOR) 250 MG capsule Take 1 capsule (250 mg total) by mouth 2 (two) times daily. 60 capsule 0  . nebivolol (BYSTOLIC) 2.5 MG tablet Take 1 tablet (2.5 mg total) by mouth every evening. 30 tablet 11   No current facility-administered medications for this visit.    Allergies:   Ultram; Buprenex; Keflex; Other; Augmentin; Hydralazine hcl; Levofloxacin; Percocet; Sulfa antibiotics; Tape; and Zetia    Social History:  The patient  reports that she has never smoked. She has never used smokeless tobacco. She reports that she does not drink alcohol or use illicit drugs.   Family History:  The patient's family history includes Cancer in her brother; Heart attack in her brother, brother, brother, brother, and father; Parkinsonism in her brother; Stroke in her mother.    ROS:   Please see the history of present illness.   Review of Systems  Constitution: Positive for malaise/fatigue.  Eyes: Positive for visual disturbance.  Cardiovascular: Positive for dyspnea on exertion.  All other systems reviewed and are negative.     PHYSICAL EXAM: VS:  BP 132/72 mmHg  Pulse 74  Ht 5' 2.5" (1.588 m)  Wt 173 lb 6.4 oz (78.654 kg)  BMI 31.19 kg/m2    Wt Readings from Last 3 Encounters:  01/02/15 173 lb 6.4 oz (78.654 kg)  12/13/14 176 lb 6.4 oz (80.015 kg)  11/06/14 175 lb 12.8 oz (79.742 kg)     GEN: Well nourished, well developed, in  no acute distress HEENT: normal Neck: no JVD,   no masses Cardiac:  Normal S1/S2, RRR; no murmur ,  no rubs or gallops, no edema   Respiratory:  clear to auscultation bilaterally, no wheezing, rhonchi or rales. GI: soft, nontender, nondistended, + BS MS: no deformity or atrophy Skin: warm and dry  Neuro:  CNs II-XII intact, Strength and sensation are intact Psych: Normal affect   EKG:  EKG is ordered today.  It demonstrates:   NSR, HR 74, normal axis, no ST changes, QTc 448 ms  Recent Labs: 12/19/2014: ALT 34 12/20/2014: BUN 13; Creatinine, Ser 0.56; Hemoglobin 13.6; Platelets 180; Potassium 3.6; Sodium 139    Lipid Panel    Component Value Date/Time   CHOL 158 01/05/2014 0346   TRIG 92 01/05/2014 0346   HDL 49 01/05/2014 0346   CHOLHDL 3.2 01/05/2014 0346   VLDL 18 01/05/2014 0346   LDLCALC 91 01/05/2014 0346      ASSESSMENT AND PLAN:  1. Hx of NSTEMI:  Presumed CAD.  Low risk myoview in 11/15 with preserved EF on Echo.  She has been managed conservatively.  No angina.  Continue ASA, beta-blocker.   2. HTN:  She has had some drops in her BP to the 110-120 range since DC after a bout with diverticulitis.  She feels bad with this.  Her BP was significantly elevated in the hospital requiring prn Hydralazine.  She did have an allergic reaction to this.  I have suggested we decrease her Bystolic to 5 mg QAM and 2.5 mg QPM.  She can hold her Bystolic if her BP is < 017 systolic prior to taking her dose.  If her BP increases > 170, she can continue to take the prn Amlodipine.  She will call if her BP is running too high or too low with this regimen.  See patient instructions.   3. Hx of SVT:  Continue beta-blocker.   4. Hx of CVA:  S/p ILR.   Continue ASA, Plavix.   5. Carotid Stenosis:  Consider repeat Carotid US in 01/2016.        Medication Changes: Current medicines are reviewed at length with the patient today.  Concerns regarding medicines are as outlined above.  The  following changes have been made:   Discontinued Medications   CIPROFLOXACIN (CIPRO) 500 MG TABLET    Take 1 tablet (500 mg total) by mouth 2 (two) times daily. 28   METRONIDAZOLE (FLAGYL) 500 MG TABLET    Take 1 tablet (500 mg total) by mouth 3 (three) times daily.   Modified Medications   Modified Medication Previous Medication   NEBIVOLOL (BYSTOLIC) 5 MG TABLET BYSTOLIC 5 MG tablet      Take 1 tablet (5 mg total) by mouth every morning.    take 1 tablet by mouth twice a day   New Prescriptions   NEBIVOLOL (BYSTOLIC) 2.5 MG TABLET    Take 1 tablet (2.5 mg total) by mouth every evening.   Labs/ tests ordered today include:   Orders Placed This Encounter  Procedures  . EKG 12-Lead     Disposition:    FU with Dr. [name] as planned.     Signed, Versie Starks, MHS 01/02/2015 5:05 PM    [facility] [address], [place], [place]  [address] Phone: [phone]; Fax: [phone]

## 2015-01-02 ENCOUNTER — Encounter: Payer: Self-pay | Admitting: Physician Assistant

## 2015-01-02 ENCOUNTER — Telehealth: Payer: Self-pay | Admitting: *Deleted

## 2015-01-02 ENCOUNTER — Ambulatory Visit (INDEPENDENT_AMBULATORY_CARE_PROVIDER_SITE_OTHER): Payer: Medicare Other | Admitting: Physician Assistant

## 2015-01-02 VITALS — BP 132/72 | HR 74 | Ht 62.5 in | Wt 173.4 lb

## 2015-01-02 DIAGNOSIS — I252 Old myocardial infarction: Secondary | ICD-10-CM

## 2015-01-02 DIAGNOSIS — I10 Essential (primary) hypertension: Secondary | ICD-10-CM | POA: Diagnosis not present

## 2015-01-02 DIAGNOSIS — Z8673 Personal history of transient ischemic attack (TIA), and cerebral infarction without residual deficits: Secondary | ICD-10-CM

## 2015-01-02 DIAGNOSIS — I471 Supraventricular tachycardia: Secondary | ICD-10-CM | POA: Diagnosis not present

## 2015-01-02 DIAGNOSIS — I2510 Atherosclerotic heart disease of native coronary artery without angina pectoris: Secondary | ICD-10-CM

## 2015-01-02 DIAGNOSIS — I6523 Occlusion and stenosis of bilateral carotid arteries: Secondary | ICD-10-CM

## 2015-01-02 MED ORDER — NEBIVOLOL HCL 5 MG PO TABS: 5.0000 mg | ORAL_TABLET | ORAL | Status: DC

## 2015-01-02 MED ORDER — NEBIVOLOL HCL 2.5 MG PO TABS
2.5000 mg | ORAL_TABLET | Freq: Every evening | ORAL | Status: DC
Start: 2015-01-02 — End: 2015-03-21

## 2015-01-02 NOTE — Telephone Encounter (Signed)
[facility] requesting call back.  Need to know if patient was symptomatic during tachy episode on LINQ transmission from 12/30/14.

## 2015-01-02 NOTE — Progress Notes (Signed)
Loop recorder 

## 2015-01-02 NOTE — Patient Instructions (Signed)
Medication Instructions:  Decrease Bystolic to 5 mg in the morning and 2.5 mg in the evening.  Labwork: None today.   Testing/Procedures: None   Follow-Up: Dr. [name] as planned.   Any Other Special Instructions Will Be Listed Below (If Applicable).  Check your BP before you take your Bysolic in the morning and before you take your Bystolic in the evening.  If your BP is less than 140, hold that dose of Bystolic.  If you hold your morning Bystolic, recheck it around 12:00 pm.  If your BP is greater than 170 (top number), take the Amlodipine 2.5 mg.  If you have to take the Amlodipine dose more than once, call so we can adjust your dosage of medication again.  If your BP looks good for a couple weeks on this regimen, you can start checking your BP when you usually would.  Call if your BP continues to drop too low.

## 2015-01-03 NOTE — Telephone Encounter (Signed)
Spoke w/ Ms. [name]. She is concerned her mother/pt might have had an MI or stroke on 10/9 or 10/12. I explained her mother's ILR cannot detect an MI. However, it will detect AF, pauses, tachy, or brady episodes. No episodes from 10/12. 1 tachy episode on 10/9. Episode shows a-tach/SVT last approx 18-19sec. Daughter asked what med should be taken when an episode occurs (asked specifically about amlodipine & nitro). I clarified her mother does not need to take an extra amlodipine or nitro during an event unless it's chest pains. I clarified nitro is only for chest pain episodes that may be related to an MI.   Daughter aware we will continue watchful waiting for potential AF or other arrhythmia via Carelink. Daughter aware Carelink does not send events immediately nor does it dial 9-1-1. Reports will be received next business day.

## 2015-01-03 NOTE — Telephone Encounter (Signed)
Pt daughter called back and said that pt was symptomatic on 12-30-14 and again on 01-02-15. Pt stated that heart started racing and it felt like her heart skipped a beat and she was laying in the bed when this occurred and she stated that she had done nothing to excite herself just normal bedtime routine and the episode occurred about 1 hour after laying in the bed. Please return pt daughter call at [phone].

## 2015-01-07 ENCOUNTER — Telehealth: Payer: Self-pay | Admitting: *Deleted

## 2015-01-07 NOTE — Telephone Encounter (Signed)
[facility] requesting call back.  Patient needs to send manual Carelink transmission.

## 2015-01-09 ENCOUNTER — Telehealth: Payer: Self-pay | Admitting: Physician Assistant

## 2015-01-09 NOTE — Telephone Encounter (Signed)
New message    Daughter calling  Was told to call back if patient held her blood pressure for one full day . Patient held medication on yesterday.

## 2015-01-09 NOTE — Telephone Encounter (Signed)
[facility] requesting call back

## 2015-01-09 NOTE — Telephone Encounter (Signed)
Able to reach [place].  Requested that she send manual transmission today for review by Dr. Rayann Heman.  [name] states that overnight she thinks that the patient felt her heart racing and that she had a "bad night".  She also states that the patient's BP has been "all over the map recently", but clarified that this is being followed by Kathleen Argue, PA, and Dr. Burt Knack.  Spoke with patient, who states that she does not recall feeling her heart racing last night (tachy episode was recorded at 22:53), but thinks she may have experienced those symptoms "a few nights ago".  Patient states that last night she could not sleep and woke up every few hours but she does not know why she was having difficulty sleeping.  Requested that patient and daughter keep log of dates/times that she has episodes where she feels like her heart is racing so that we can see if any episodes correlate.  Again requested manual transmission for further review.  Patient and [name] both voice understanding of instructions.  [name] states that she will send manual transmission tonight.  Will review transmission with Dr. Rayann Heman and call patient and daughter back with any further recommendations.

## 2015-01-10 NOTE — Telephone Encounter (Signed)
I s/w pt today about her BP, she tells me that her BP the day before was SBP 136. She states this morning it was around 130 today and she will recheck in a few minutes, if SBP 140 or higher take Bystolic, if SBP higher 168 take norvasc 2.5. Pt agreeable

## 2015-01-10 NOTE — Telephone Encounter (Signed)
Spoke with [name] regarding manual transmission.  No additional episodes on transmission.  Episodes reviewed with Dr. Rayann Heman yesterday, no additional recommendations at this time.  [name] aware.  Again encouraged [name] and the patient to keep a log of dates/times of episodes and her symptoms and to call with worsening symptoms or questions.  [name] voices understanding and agreement with plan.  She denies any additional questions or concerns at this time.

## 2015-01-16 ENCOUNTER — Other Ambulatory Visit: Payer: Self-pay | Admitting: Cardiovascular Disease

## 2015-01-21 LAB — CUP PACEART REMOTE DEVICE CHECK: Date Time Interrogation Session: 20161010230611

## 2015-01-30 ENCOUNTER — Ambulatory Visit (INDEPENDENT_AMBULATORY_CARE_PROVIDER_SITE_OTHER): Payer: Medicare Other | Admitting: *Deleted

## 2015-01-30 DIAGNOSIS — I639 Cerebral infarction, unspecified: Secondary | ICD-10-CM | POA: Diagnosis not present

## 2015-01-31 NOTE — Progress Notes (Signed)
Carelink Summary Report / Loop recorder 

## 2015-02-01 ENCOUNTER — Encounter: Payer: Self-pay | Admitting: Internal Medicine

## 2015-02-05 ENCOUNTER — Ambulatory Visit (INDEPENDENT_AMBULATORY_CARE_PROVIDER_SITE_OTHER): Payer: Medicare Other | Admitting: Gastroenterology

## 2015-02-05 ENCOUNTER — Encounter: Payer: Self-pay | Admitting: Gastroenterology

## 2015-02-05 VITALS — BP 146/72 | HR 76 | Ht 62.5 in | Wt 172.6 lb

## 2015-02-05 DIAGNOSIS — K5732 Diverticulitis of large intestine without perforation or abscess without bleeding: Secondary | ICD-10-CM | POA: Diagnosis not present

## 2015-02-05 NOTE — Patient Instructions (Signed)
Thank you for choosing [facility] to care for you.  

## 2015-02-05 NOTE — Progress Notes (Signed)
HPI :  81 y/o female here for follow up. Former patient of Dr. Olevia Perches. History of CVA, SVT, and MI. Currently on plavix. She has a history of left sided diverticulitis, and admitted in October first week for her most recent occurrence. This was her first episode of diverticulitis requiring hospitalization. She failed outpatient management. Her symptoms included severe LLQ pain. Given IV antibiotics for 3-4 days and discharged. It eventually worked to help her symptoms. She reports she is doing okay since discharge and feels significantly improved. She has been limiting herself in regards to diet since her discharge. She is trying to follow low residue diet and avoid seeds. Her pain resolved at baseline, sometimes some rare pain. No blood in the stools. No diarrhea or constipation. She has had diverticulitis three to four times in the past 2 years or so per daugher. This was her first hospitalization for diverticulitis. She denies any bowel habit trouble at present time, no constipation or diarrhea.  Last colonoscopy in 06/2002 showing left sided diverticulosis.    Past Medical History  Diagnosis Date  . Arthritis   . Blind left eye     Corneal transplant x3 with rejection  . Dysrhythmia   . Varicose veins     both legs and left fooot at arch  . Complication of anesthesia     sensitive to meds, bp can fall  . SVT (supraventricular tachycardia) ([place]) 12/15/2011    seen by Dr. Caryl Comes  . Helicobacter pylori (H. pylori) 05/22/02    RUT-Positive  . Gastritis   . Diverticulosis   . Stroke ([place])   . Irregular heartbeat   . History of stroke 06/14/2014  . Diverticulitis large intestine      Past Surgical History  Procedure Laterality Date  . Cholecystectomy    . Tonsillectomy    . Tonsillectomy    . Corneal transplant  right 2009, left 2009    both eyes, 3 in left eye, 1 in right eye  . Cataract surgery  2005    both eyes  . Abdominal hysterectomy   sept 1985    ovaries removed  . Total  knee arthroplasty Left 06/27/2012    Procedure: LEFT TOTAL KNEE ARTHROPLASTY;  Surgeon: Gearlean Alf, MD;  Location: [facility];  Service: Orthopedics;  Laterality: Left;  . Tubal ligation    . Total knee arthroplasty Right 12/12/2012    Procedure: RIGHT TOTAL KNEE ARTHROPLASTY;  Surgeon: Gearlean Alf, MD;  Location: [facility];  Service: Orthopedics;  Laterality: Right;  . Loop recorder implant N/A 01/05/2014    Procedure: LOOP RECORDER IMPLANT;  Surgeon: Coralyn Mark, MD;  Location: [facility] CATH [facility];  Service: Cardiovascular;  Laterality: N/A;   Family History  Problem Relation Age of Onset  . Stroke Mother   . Heart attack Father   . Heart attack Brother   . Cancer Brother   . Heart attack Brother   . Heart attack Brother   . Heart attack Brother   . Parkinsonism Brother    Social History  Substance Use Topics  . Smoking status: Never Smoker   . Smokeless tobacco: Never Used  . Alcohol Use: No   Current Outpatient Prescriptions  Medication Sig Dispense Refill  . amLODipine (NORVASC) 2.5 MG tablet Take 2.5 mg by mouth daily as needed (BLOOD PRESSURE).     Marland Kitchen aspirin EC 81 MG EC tablet Take 1 tablet (81 mg total) by mouth daily.    . cholecalciferol (VITAMIN D)  1000 UNITS tablet Take 1,000 Units by mouth daily.    . clopidogrel (PLAVIX) 75 MG tablet take 1 tablet by mouth once daily 30 tablet 5  . hyoscyamine (LEVSIN SL) 0.125 MG SL tablet Place 1 tablet (0.125 mg total) under the tongue every 4 (four) hours as needed. (Patient taking differently: Place 0.125 mg under the tongue every 4 (four) hours as needed (STOMACH CRAMPS). ) 30 tablet 0  . Melatonin 1 MG SUBL Place 1 mg under the tongue at bedtime.    . Multiple Vitamin (MULTI VITAMIN DAILY PO) Take 1 tablet by mouth every morning.    . nebivolol (BYSTOLIC) 2.5 MG tablet Take 1 tablet (2.5 mg total) by mouth every evening. 30 tablet 11  . nebivolol (BYSTOLIC) 5 MG tablet Take 1 tablet (5 mg total) by mouth every morning.  6  .  nitroGLYCERIN (NITROSTAT) 0.4 MG SL tablet Place 1 tablet (0.4 mg total) under the tongue every 5 (five) minutes as needed for chest pain. 25 tablet 3  . prednisoLONE acetate (PRED FORTE) 1 % ophthalmic suspension Place 1 drop into both eyes 2 (two) times daily.     . ranitidine (ZANTAC) 150 MG tablet Take 1 tablet (150 mg total) by mouth at bedtime. 30 tablet 3   No current facility-administered medications for this visit.   Allergies  Allergen Reactions  . Ultram [Tramadol] Hives  . Buprenex [Buprenorphine Hcl] Other (See Comments)    Heart raced  . Keflex [Cephalexin] Diarrhea  . Other Swelling    Yellow eye dye that is used to check retina. Caused swelling of tongue.  . Augmentin [Amoxicillin-Pot Clavulanate] Nausea And Vomiting  . Hydralazine Hcl Rash    Patient developed facial flushing, palpitations, swelling in legs.  . Levofloxacin Other (See Comments)    levaquin unknown reaction  . Percocet [Oxycodone-Acetaminophen] Nausea And Vomiting and Other (See Comments)    "felt drained"  . Sulfa Antibiotics Other (See Comments)    unknown  . Tape Other (See Comments)    Adhesive tape causes skin to pull off  . Zetia [Ezetimibe] Other (See Comments)    unknown     Review of Systems: All systems reviewed and negative except where noted in HPI.   Lab Results  Component Value Date   WBC 5.8 12/20/2014   HGB 13.6 12/20/2014   HCT 42.0 12/20/2014   MCV 95.7 12/20/2014   PLT 180 12/20/2014    CT abdomen 12/17/14: IMPRESSION: Diverticulitis in the rectal sigmoid junction. No abscess is identified.  Physical Exam: BP 146/72 mmHg  Pulse 76  Ht 5' 2.5" (1.588 m)  Wt 172 lb 9.6 oz (78.291 kg)  BMI 31.05 kg/m2 Constitutional: Pleasant,well-developed, female in no acute distress. HEENT: Normocephalic and atraumatic. Conjunctivae are normal. No scleral icterus. Neck supple.  Cardiovascular: Normal rate, regular rhythm.  Pulmonary/chest: Effort normal and breath sounds  normal. No wheezing, rales or rhonchi. Abdominal: Soft, nondistended, nontender. Bowel sounds active throughout. There are no masses palpable.  Extremities: trace edema B LE Lymphadenopathy: No cervical adenopathy noted. Neurological: Alert and oriented to person place and time. Skin: Skin is warm and dry. No rashes noted. Psychiatric: Normal mood and affect. Behavior is normal.   ASSESSMENT AND PLAN: 81 y/o female here for follow up after recent hospitalization for diverticulitis of the rectosigmoid colon. She responded well to IV antibiotics and overall doing very well following her hospitalization.   I discussed her recent course with the patient and her daughter. Her colonoscopy was  last done in 2004 which showed left sided diverticulosis. She has had 3-4 episodes of left sided diverticulitis starting over the past 2 years. We discussed the role of endoscopy following diverticulitis to ensure no polyp or mass lesion in the area that would predispose her to diverticulitis, given her last exam was in 2004. We would not need to perform a colonoscopy, and could evaluate the area with a limited flex sig without sedation, given her comorbidities. She reports feeling well at this time and wants to avoid invasive procedures if possible. She wishes to think about it and see me again in 1-2 months for reassessment of this issue. If we were to perform a flex sig I would want to do it at least 6 weeks out from her last episode.  Otherwise we discussed long term management of recurrent diverticulitis, namely surgical resection of the involved segment vs. antibiotics as needed for recurrence. Given her comorbidities and age, the patient and daughter wish to avoid surgery if at all possible which I agree with. If she has any recurrence of symptoms moving forward recommend she contact us for antibiotics. Otherwise, we discussed diet recommendations at this time. There is no evidence to support withholding seeds /  nuts from the diet prevents diverticulitis, and she can resume her regular diet at this time. Recommend a high fiber diet or a daily fiber supplement in light of her diverticulosis.   She agreed and will follow up in 1-2 months for reassessment  Wheaton Cellar, MD [facility] Pager [phone]

## 2015-02-07 ENCOUNTER — Telehealth: Payer: Self-pay | Admitting: *Deleted

## 2015-02-07 NOTE — Telephone Encounter (Signed)
Called patient regarding tachy episode on LINQ transmission from 02/04/15.  Patient unsure if she was symptomatic with this specific episode, states she "had a couple of times when I felt my heart racing at night, but it didn't last long".  Again encouraged patient to keep log of dates/times of symptoms to see if they correlate with episodes on LINQ, and to call with worsening symptoms or concerns.  Patient verbalizes understanding of instructions and denies questions or concerns at this time.

## 2015-02-11 ENCOUNTER — Telehealth: Payer: Self-pay | Admitting: Cardiology

## 2015-02-11 NOTE — Telephone Encounter (Signed)
Manual transmission successfully received.  Placed in Dr. Otilio Connors folder for review.

## 2015-02-11 NOTE — Telephone Encounter (Signed)
Informed pt that MD wanted her to send a manual transmission from home monitor so he could review some information further. Pt verbalized understanding.

## 2015-02-12 ENCOUNTER — Encounter: Payer: Self-pay | Admitting: Cardiovascular Disease

## 2015-02-19 ENCOUNTER — Encounter: Payer: Self-pay | Admitting: Cardiovascular Disease

## 2015-02-22 ENCOUNTER — Encounter: Payer: Self-pay | Admitting: Internal Medicine

## 2015-02-24 LAB — CUP PACEART REMOTE DEVICE CHECK: MDC IDC SESS DTM: 20161109233620

## 2015-02-24 NOTE — Progress Notes (Signed)
Carelink summary report received. Battery status OK. Normal device function. No new symptom, brady, pause, or tachy episodes. 2 tachy episodes, previously reviewed by JA--SVT degenerates into transient AF, then sinus, longest ~10sec, peak V 150bpm, +ASA 81mg  and Plavix. Will monitor for additional AF via monthly summary reports, ROV with JA PRN.

## 2015-02-25 ENCOUNTER — Other Ambulatory Visit: Payer: Self-pay

## 2015-03-01 ENCOUNTER — Ambulatory Visit (INDEPENDENT_AMBULATORY_CARE_PROVIDER_SITE_OTHER): Payer: Medicare Other | Admitting: *Deleted

## 2015-03-01 DIAGNOSIS — I639 Cerebral infarction, unspecified: Secondary | ICD-10-CM

## 2015-03-04 NOTE — Progress Notes (Signed)
Carelink Summary Report / Loop Recorder 

## 2015-03-05 ENCOUNTER — Encounter: Payer: Self-pay | Admitting: Internal Medicine

## 2015-03-05 ENCOUNTER — Emergency Department (HOSPITAL_COMMUNITY): Payer: Medicare Other

## 2015-03-05 ENCOUNTER — Observation Stay (HOSPITAL_COMMUNITY)
Admission: EM | Admit: 2015-03-05 | Discharge: 2015-03-06 | Disposition: A | Discharge status: Home / Self Care | Payer: Medicare Other | Attending: Cardiovascular Disease | Admitting: Cardiovascular Disease

## 2015-03-05 ENCOUNTER — Telehealth: Payer: Self-pay | Admitting: Cardiovascular Disease

## 2015-03-05 ENCOUNTER — Encounter (HOSPITAL_COMMUNITY): Payer: Self-pay | Admitting: Neurology

## 2015-03-05 DIAGNOSIS — I252 Old myocardial infarction: Secondary | ICD-10-CM | POA: Insufficient documentation

## 2015-03-05 DIAGNOSIS — H5442 Blindness, left eye, normal vision right eye: Secondary | ICD-10-CM | POA: Diagnosis not present

## 2015-03-05 DIAGNOSIS — H5440 Blindness, one eye, unspecified eye: Secondary | ICD-10-CM | POA: Diagnosis present

## 2015-03-05 DIAGNOSIS — R079 Chest pain, unspecified: Secondary | ICD-10-CM | POA: Diagnosis not present

## 2015-03-05 DIAGNOSIS — Z7982 Long term (current) use of aspirin: Secondary | ICD-10-CM | POA: Diagnosis not present

## 2015-03-05 DIAGNOSIS — R7303 Prediabetes: Secondary | ICD-10-CM | POA: Diagnosis present

## 2015-03-05 DIAGNOSIS — K2970 Gastritis, unspecified, without bleeding: Secondary | ICD-10-CM | POA: Diagnosis not present

## 2015-03-05 DIAGNOSIS — I214 Non-ST elevation (NSTEMI) myocardial infarction: Secondary | ICD-10-CM | POA: Diagnosis present

## 2015-03-05 DIAGNOSIS — Z8619 Personal history of other infectious and parasitic diseases: Secondary | ICD-10-CM | POA: Insufficient documentation

## 2015-03-05 DIAGNOSIS — I471 Supraventricular tachycardia: Secondary | ICD-10-CM | POA: Insufficient documentation

## 2015-03-05 DIAGNOSIS — Z8679 Personal history of other diseases of the circulatory system: Secondary | ICD-10-CM

## 2015-03-05 DIAGNOSIS — Z7902 Long term (current) use of antithrombotics/antiplatelets: Secondary | ICD-10-CM | POA: Insufficient documentation

## 2015-03-05 DIAGNOSIS — I868 Varicose veins of other specified sites: Secondary | ICD-10-CM | POA: Insufficient documentation

## 2015-03-05 DIAGNOSIS — I10 Essential (primary) hypertension: Secondary | ICD-10-CM | POA: Diagnosis present

## 2015-03-05 DIAGNOSIS — Z79899 Other long term (current) drug therapy: Secondary | ICD-10-CM | POA: Insufficient documentation

## 2015-03-05 DIAGNOSIS — M1990 Unspecified osteoarthritis, unspecified site: Secondary | ICD-10-CM | POA: Diagnosis not present

## 2015-03-05 DIAGNOSIS — Z8673 Personal history of transient ischemic attack (TIA), and cerebral infarction without residual deficits: Secondary | ICD-10-CM

## 2015-03-05 HISTORY — DX: Atherosclerotic heart disease of native coronary artery without angina pectoris: I25.10

## 2015-03-05 LAB — CBC
HCT: 46.4 % — ABNORMAL HIGH (ref 36.0–46.0)
Hemoglobin: 15 g/dL (ref 12.0–15.0)
MCH: 31.4 pg (ref 26.0–34.0)
MCHC: 32.3 g/dL (ref 30.0–36.0)
MCV: 97.3 fL (ref 78.0–100.0)
PLATELETS: 185 10*3/uL (ref 150–400)
RBC: 4.77 MIL/uL (ref 3.87–5.11)
RDW: 13.4 % (ref 11.5–15.5)
WBC: 4.2 10*3/uL (ref 4.0–10.5)

## 2015-03-05 LAB — BASIC METABOLIC PANEL
Anion gap: 8 (ref 5–15)
BUN: 10 mg/dL (ref 6–20)
CALCIUM: 9.2 mg/dL (ref 8.9–10.3)
CO2: 30 mmol/L (ref 22–32)
CREATININE: 0.69 mg/dL (ref 0.44–1.00)
Chloride: 101 mmol/L (ref 101–111)
GFR calc non Af Amer: >60 mL/min (ref >60)
GLUCOSE: 174 mg/dL — AB (ref 65–99)
Potassium: 3.5 mmol/L (ref 3.5–5.1)
Sodium: 139 mmol/L (ref 135–145)

## 2015-03-05 LAB — I-STAT TROPONIN, ED: TROPONIN I, POC: 0.01 ng/mL (ref 0.00–0.08)

## 2015-03-05 LAB — TROPONIN I
Troponin I: <0.03 ng/mL (ref <0.031)
Troponin I: <0.03 ng/mL (ref <0.031)

## 2015-03-05 LAB — TSH: TSH: 2.581 u[IU]/mL (ref 0.350–4.500)

## 2015-03-05 MED ORDER — NITROGLYCERIN 0.4 MG SL SUBL: 0.4000 mg | SUBLINGUAL_TABLET | SUBLINGUAL | Status: DC | PRN

## 2015-03-05 MED ORDER — ASPIRIN EC 81 MG PO TBEC
81.0000 mg | DELAYED_RELEASE_TABLET | Freq: Every day | ORAL | Status: DC
Start: 2015-03-06 — End: 2015-03-05

## 2015-03-05 MED ORDER — CLOPIDOGREL BISULFATE 75 MG PO TABS
75.0000 mg | ORAL_TABLET | Freq: Every day | ORAL | Status: DC
  Administered 2015-03-06: 75 mg via ORAL
  Filled 2015-03-05: qty 1

## 2015-03-05 MED ORDER — PREDNISOLONE ACETATE 1 % OP SUSP
1.0000 [drp] | Freq: Two times a day (BID) | OPHTHALMIC | Status: DC
  Administered 2015-03-05: 1 [drp] via OPHTHALMIC
  Filled 2015-03-05: qty 1

## 2015-03-05 MED ORDER — GI COCKTAIL ~~LOC~~
30.0000 mL | Freq: Once | ORAL | Status: AC
  Administered 2015-03-05: 30 mL via ORAL
  Filled 2015-03-05: qty 30

## 2015-03-05 MED ORDER — SODIUM CHLORIDE 0.9 % IJ SOLN: 3.0000 mL | INTRAMUSCULAR | Status: DC | PRN

## 2015-03-05 MED ORDER — AMLODIPINE BESYLATE 2.5 MG PO TABS: 2.5000 mg | ORAL_TABLET | Freq: Every day | ORAL | Status: DC | PRN

## 2015-03-05 MED ORDER — ASPIRIN EC 81 MG PO TBEC
81.0000 mg | DELAYED_RELEASE_TABLET | Freq: Every day | ORAL | Status: DC
  Administered 2015-03-06: 81 mg via ORAL
  Filled 2015-03-05: qty 1

## 2015-03-05 MED ORDER — HEPARIN SODIUM (PORCINE) 5000 UNIT/ML IJ SOLN
5000.0000 [IU] | Freq: Three times a day (TID) | INTRAMUSCULAR | Status: DC
  Filled 2015-03-05: qty 1

## 2015-03-05 MED ORDER — SODIUM CHLORIDE 0.9 % IJ SOLN
3.0000 mL | Freq: Two times a day (BID) | INTRAMUSCULAR | Status: DC
  Administered 2015-03-05: 3 mL via INTRAVENOUS

## 2015-03-05 MED ORDER — FAMOTIDINE 20 MG PO TABS
20.0000 mg | ORAL_TABLET | Freq: Every day | ORAL | Status: DC
  Administered 2015-03-05: 20 mg via ORAL
  Filled 2015-03-05: qty 1

## 2015-03-05 MED ORDER — SODIUM CHLORIDE 0.9 % IV SOLN: 250.0000 mL | INTRAVENOUS | Status: DC | PRN

## 2015-03-05 MED ORDER — NEBIVOLOL HCL 2.5 MG PO TABS
2.5000 mg | ORAL_TABLET | Freq: Every evening | ORAL | Status: DC
  Administered 2015-03-05: 2.5 mg via ORAL
  Filled 2015-03-05 (×2): qty 1

## 2015-03-05 MED ORDER — ACETAMINOPHEN 325 MG PO TABS: 650.0000 mg | ORAL_TABLET | ORAL | Status: DC | PRN

## 2015-03-05 MED ORDER — ONDANSETRON HCL 4 MG/2ML IJ SOLN: 4.0000 mg | Freq: Four times a day (QID) | INTRAMUSCULAR | Status: DC | PRN

## 2015-03-05 MED ORDER — HYOSCYAMINE SULFATE 0.125 MG SL SUBL
0.1250 mg | SUBLINGUAL_TABLET | SUBLINGUAL | Status: DC | PRN
  Filled 2015-03-05: qty 1

## 2015-03-05 NOTE — Plan of Care (Signed)
Problem: Phase I Progression Outcomes Goal: Anginal pain relieved Outcome: Completed/Met Date Met:  03/05/15 Patient has no active complaints. Vital signs WDL for patient with exception to mild hypertension (controlled with oral medications): Filed Vitals:    03/05/15 1330 03/05/15 1400 03/05/15 1543 03/05/15 2126  BP: 133/66 129/63 154/63 157/62  Pulse: 72 74 77 78  Temp:     98.6 F (37 C) 98.2 F (36.8 C)  TempSrc:     Oral Oral  Resp: 19 16 16     Height:     5' 2.5" (1.588 m)    Weight:     77.656 kg (171 lb 3.2 oz)    SpO2: 96% 96% 97% 95%   Family at bedside. Patient and family educated re:  Plan of care (observation of chest pain)  Medications  Unit routines  Tests/procedures (troponin I every six hours, AM labs, EKG, possible stress test vs. cardiac catheterization - brief summary)  Safety plan  Activity progression  Patient has good knowledge of her current health conditions. 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 This does not impair her mobility at this time. Medications are being safely managed by family at home.  Patient will need ongoing assessment and education re:  plan of care, new medications (if any).  Will keep NPO after midnight for possible myoview vs. cardiac catheterization.  Continuing to monitor.

## 2015-03-05 NOTE — ED Notes (Signed)
Pt reports central cp since 0300 this morning with radiation to her middle of her back. SOB with exertion. Took nitro this am and that helped with pain. Also has been having urinary frequency. Has cardiac hx.

## 2015-03-05 NOTE — ED Provider Notes (Signed)
CSN: [id number]     Arrival date & time 03/05/15  1108 History   First MD Initiated Contact with Patient 03/05/15 1258     Chief Complaint  Patient presents with  . Chest Pain     (Consider location/radiation/quality/duration/timing/severity/associated sxs/prior Treatment) HPI [name] [name] is a 81 y.o. female with a history of MI in 11/2013, loop recorder 12/2013, comes in for evaluation of chest pain. Patient reports she woke up this morning to go to the bathroom and began to experience central chest pain that radiated through to her back. She reports difficulty finding a comfortable position. She denies any associated dizziness, shortness of breath, nausea or vomiting, numbness or weakness, leg swelling, recent travel or surgeries. She reports the symptoms have persisted all day. She took amlodipine at 6:00 AM that did not help, then 1 sublingual nitroglycerin at 9:00 AM and that seemed to help "a little bit". She called her PCP and was referred to ED. She reports her discomfort now has a 2/10. She does report associated mild shortness of breath on exertion over the past 6 months. No other aggravating or modifying factors. She is on 81 mg aspirin and Plavix therapy.  Past Medical History  Diagnosis Date  . Arthritis   . Blind left eye     Corneal transplant x3 with rejection  . Dysrhythmia   . Varicose veins     both legs and left fooot at arch  . Complication of anesthesia     sensitive to meds, bp can fall  . SVT (supraventricular tachycardia) ([place]) 12/15/2011    seen by Dr. Caryl Comes  . Helicobacter pylori (H. pylori) 05/22/02    RUT-Positive  . Gastritis   . Diverticulosis   . Stroke ([place])   . Irregular heartbeat   . History of stroke 06/14/2014  . Diverticulitis large intestine   . MI (myocardial infarction) [facility])    Past Surgical History  Procedure Laterality Date  . Cholecystectomy    . Tonsillectomy    . Tonsillectomy    . Corneal transplant  right 2009, left 2009    both  eyes, 3 in left eye, 1 in right eye  . Cataract surgery  2005    both eyes  . Abdominal hysterectomy   sept 1985    ovaries removed  . Total knee arthroplasty Left 06/27/2012    Procedure: LEFT TOTAL KNEE ARTHROPLASTY;  Surgeon: Gearlean Alf, MD;  Location: [facility];  Service: Orthopedics;  Laterality: Left;  . Tubal ligation    . Total knee arthroplasty Right 12/12/2012    Procedure: RIGHT TOTAL KNEE ARTHROPLASTY;  Surgeon: Gearlean Alf, MD;  Location: [facility];  Service: Orthopedics;  Laterality: Right;  . Loop recorder implant N/A 01/05/2014    Procedure: LOOP RECORDER IMPLANT;  Surgeon: Coralyn Mark, MD;  Location: [place] CATH LAB;  Service: Cardiovascular;  Laterality: N/A;   Family History  Problem Relation Age of Onset  . Stroke Mother   . Heart attack Father   . Heart attack Brother   . Cancer Brother   . Heart attack Brother   . Heart attack Brother   . Heart attack Brother   . Parkinsonism Brother    Social History  Substance Use Topics  . Smoking status: Never Smoker   . Smokeless tobacco: Never Used  . Alcohol Use: No   OB History    No data available     Review of Systems A 10 point review of systems was completed  and was negative except for pertinent positives and negatives as mentioned in the history of present illness     Allergies  Ultram; Buprenex; Keflex; Other; Augmentin; Hydralazine hcl; Levofloxacin; Percocet; Sulfa antibiotics; Tape; and Zetia  Home Medications   Prior to Admission medications   Medication Sig Start Date End Date Taking? Authorizing Provider  acetaminophen (TYLENOL) 500 MG tablet Take 500 mg by mouth every 6 (six) hours as needed for mild pain.   Yes Historical Provider, MD  amLODipine (NORVASC) 2.5 MG tablet Take 2.5 mg by mouth daily as needed (BLOOD PRESSURE). Only take if greater than 140   Yes Historical Provider, MD  aspirin EC 81 MG EC tablet Take 1 tablet (81 mg total) by mouth daily. 01/22/14  Yes Mechele Claude, DO   cholecalciferol (VITAMIN D) 1000 UNITS tablet Take 1,000 Units by mouth daily.   Yes Historical Provider, MD  clopidogrel (PLAVIX) 75 MG tablet take 1 tablet by mouth once daily 01/17/15  Yes [name], MD  hyoscyamine (LEVSIN SL) 0.125 MG SL tablet Place 1 tablet (0.125 mg total) under the tongue every 4 (four) hours as needed. Patient taking differently: Place 0.125 mg under the tongue every 4 (four) hours as needed (STOMACH CRAMPS).  11/06/14  Yes Lafayette Dragon, MD  Melatonin 1 MG SUBL Place 1 mg under the tongue at bedtime.   Yes Historical Provider, MD  Multiple Vitamin (MULTI VITAMIN DAILY PO) Take 1 tablet by mouth every morning.   Yes Historical Provider, MD  nebivolol (BYSTOLIC) 2.5 MG tablet Take 1 tablet (2.5 mg total) by mouth every evening. Patient taking differently: Take 2.5 mg by mouth every evening. Only take if blood pressure is greater than 140 01/02/15  Yes Scott T Weaver, PA-C  nitroGLYCERIN (NITROSTAT) 0.4 MG SL tablet Place 1 tablet (0.4 mg total) under the tongue every 5 (five) minutes as needed for chest pain. 02/05/14  Yes Imogene Burn, PA-C  prednisoLONE acetate (PRED FORTE) 1 % ophthalmic suspension Place 1 drop into both eyes 2 (two) times daily.    Yes Historical Provider, MD  ranitidine (ZANTAC) 150 MG tablet Take 1 tablet (150 mg total) by mouth at bedtime. 11/07/14  Yes Lafayette Dragon, MD   BP 133/66 mmHg  Pulse 72  Temp(Src) 98.3 F (36.8 C) (Oral)  Resp 19  SpO2 96% Physical Exam  Constitutional: She is oriented to person, place, and time. She appears well-developed and well-nourished.  HENT:  Head: Normocephalic and atraumatic.  Mouth/Throat: Oropharynx is clear and moist.  Eyes: Conjunctivae are normal. Pupils are equal, round, and reactive to light. Right eye exhibits no discharge. Left eye exhibits no discharge. No scleral icterus.  Neck: Neck supple.  Cardiovascular: Normal rate, regular rhythm and normal heart sounds.   Pulmonary/Chest: Effort  normal and breath sounds normal. No respiratory distress. She has no wheezes. She has no rales.  Abdominal: Soft. There is no tenderness.  Musculoskeletal: She exhibits no edema or tenderness.  Neurological: She is alert and oriented to person, place, and time.  Cranial Nerves II-XII grossly intact  Skin: Skin is warm and dry. No rash noted.  Psychiatric: She has a normal mood and affect.  Nursing note and vitals reviewed.   ED Course  Procedures (including critical care time) Labs Review Labs Reviewed  BASIC METABOLIC PANEL - Abnormal; Notable for the following:    Glucose, Bld 174 (*)    All other components within normal limits  CBC - Abnormal; Notable for  the following:    HCT 46.4 (*)    All other components within normal limits  TROPONIN I  TROPONIN I  TSH  I-STAT TROPOININ, ED    Imaging Review Dg Chest 2 View  03/05/2015  CLINICAL DATA:  Mid chest pain since this morning. EXAM: CHEST  2 VIEW COMPARISON:  01/19/2014. FINDINGS: The cardiac silhouette, mediastinal and hilar contours are within normal limits and stable. Stable tortuosity and calcification of the thoracic aorta. A loop recorder is noted. The lungs are clear. No pleural effusion. The bony thorax is intact. Stable moderate degenerative changes involving the thoracic spine. IMPRESSION: No acute cardiopulmonary findings. Electronically Signed   By: Marijo Sanes M.D.   On: 03/05/2015 12:12   I have personally reviewed and evaluated these images and lab results as part of my medical decision-making.   EKG Interpretation   Date/Time:  Tuesday March 05 2015 11:30:25 EST Ventricular Rate:  91 PR Interval:  168 QRS Duration: 88 QT Interval:  380 QTC Calculation: 467 R Axis:   66 Text Interpretation:  Normal sinus rhythm Normal ECG Confirmed by Hazle Coca [phone]) on 03/05/2015 1:37:04 PM     Meds given in ED:  Medications  aspirin EC tablet 81 mg (not administered)  nitroGLYCERIN (NITROSTAT) SL tablet  0.4 mg (not administered)  acetaminophen (TYLENOL) tablet 650 mg (not administered)  ondansetron (ZOFRAN) injection 4 mg (not administered)  heparin injection 5,000 Units (not administered)  sodium chloride 0.9 % injection 3 mL (not administered)  sodium chloride 0.9 % injection 3 mL (not administered)  0.9 %  sodium chloride infusion (not administered)  amLODipine (NORVASC) tablet 2.5 mg (not administered)  aspirin EC tablet 81 mg (not administered)  clopidogrel (PLAVIX) tablet 75 mg (not administered)  hyoscyamine (LEVSIN SL) SL tablet 0.125 mg (not administered)  nebivolol (BYSTOLIC) tablet 2.5 mg (not administered)  prednisoLONE acetate (PRED FORTE) 1 % ophthalmic suspension 1 drop (not administered)  famotidine (PEPCID) tablet 20 mg (not administered)  gi cocktail (Maalox,Lidocaine,Donnatal) (30 mLs Oral Given 03/05/15 1357)    New Prescriptions   No medications on file   Filed Vitals:   03/05/15 1132 03/05/15 1330  BP: 153/74 133/66  Pulse: 95 72  Temp: 98.3 F (36.8 C)   TempSrc: Oral   Resp: 16 19  SpO2: 100% 96%    MDM  Vaudine Prairie is a 81 y.o. female with history of reported heart attack in September 2015, on aspirin and Plavix therapy comes in for evaluation of chest pain. Patient reports central chest pain that started at 3:00 this morning, radiates through to the back and has been constant. On arrival, she is hemodynamically stable with normal vital signs is afebrile. Her initial troponin was negative, chest x-ray shows no acute cardiopulmonary findings, EKG shows normal sinus rhythm. She rates her discomfort now in the ED is a 1/10. Plan to consult cardiology. Discussed with my attending, Dr. Ralene Bathe who also saw and evaluated the patient and agrees with plan for cardiology consult. Discussed with cardiology, Kerin Ransom, PA-C, patient will be admitted for observation. Final diagnoses:  Chest pain, unspecified chest pain type        Comer Locket,  PA-C 03/05/15 [place], MD 03/06/15 [phone]

## 2015-03-05 NOTE — Telephone Encounter (Signed)
Patient woke up at 0300 with pain in chest Pain also was radiating into back Took a Tylenol  Later took amlodipine Calling this morning because pain persist in her chest radiating to back Said her heart rate is in the 90's Has nitroglycerin but did not think about taking it Instructed patient to lay down or sit and try to see if this makes a difference Advised her to seek medical attention at the ED so they could do an EKG and evaluate her chest pain Son is with her She agreed to instructions

## 2015-03-05 NOTE — Telephone Encounter (Signed)
New message     Pt c/o of Chest Pain: STAT if CP now or developed within 24 hours  1. Are you having CP right now? yes 2. Are you experiencing any other symptoms (ex. SOB, nausea, vomiting, sweating)? no 3. How long have you been experiencing CP? Since 3am 4. Is your CP continuous or coming and going? continuous 5. Have you taken Nitroglycerin? No---but she took a tylenol and amiodarone ?

## 2015-03-05 NOTE — H&P (Signed)
Patient ID: [name] [name] MRN: [id number], DOB/AGE: 1925/01/06   Admit date: 03/05/2015   Primary Physician: Marjorie Smolder, MD Primary Cardiologist: Dr Burt Knack  HPI:    81 y.o. female, widowed, lives alone, followed by Dr Burt Knack for CAD, cerebrovascular disease, HTN, remote PSVT 2013, and heart palpitations.In Oct 2015 she was hospitalized with a Rt brain stroke. A Loop recorder was placed but I don't see any documentation of arrhythmia.  She then presented in Nov 2015 with a non-ST elevation MI. She was treated conservatively. A nuclear scan was low risk without significant ischemia identified and an LVEF of 74%. She has been treated with aspirin, Plavix and sliding scale Amlodipine and Bystolic for HTN. She is apparently very sensitive to changes in her B/P.            She is admitted now with chest pain. Pt says she was up at 3 am to use the bathroom when she noted palpitations and mid sternal chest pain that radiated to her back. Symptoms were constant and not associated with nausea, vomiting, or radiation to her jaw or arms. She took Norvasc, Tylenol, then a NTG without relief. She called her daughter and came to the ED this am. Troponin is negative x 1, EKG shows NSR, no acute changes. She continues to have vague "pressure".    Problem List: Past Medical History  Diagnosis Date  . Arthritis   . Blind left eye     Corneal transplant x3 with rejection  . Dysrhythmia   . Varicose veins     both legs and left fooot at arch  . Complication of anesthesia     sensitive to meds, bp can fall  . SVT (supraventricular tachycardia) ([place]) 12/15/2011    seen by Dr. Caryl Comes  . Helicobacter pylori (H. pylori) 05/22/02    RUT-Positive  . Gastritis   . Diverticulosis   . Stroke ([place])   . Irregular heartbeat   . History of stroke 06/14/2014  . Diverticulitis large intestine   . MI (myocardial infarction) [facility])     Past Surgical History  Procedure Laterality Date  . Cholecystectomy    .  Tonsillectomy    . Tonsillectomy    . Corneal transplant  right 2009, left 2009    both eyes, 3 in left eye, 1 in right eye  . Cataract surgery  2005    both eyes  . Abdominal hysterectomy   sept 1985    ovaries removed  . Total knee arthroplasty Left 06/27/2012    Procedure: LEFT TOTAL KNEE ARTHROPLASTY;  Surgeon: Gearlean Alf, MD;  Location: [facility];  Service: Orthopedics;  Laterality: Left;  . Tubal ligation    . Total knee arthroplasty Right 12/12/2012    Procedure: RIGHT TOTAL KNEE ARTHROPLASTY;  Surgeon: Gearlean Alf, MD;  Location: [facility];  Service: Orthopedics;  Laterality: Right;  . Loop recorder implant N/A 01/05/2014    Procedure: LOOP RECORDER IMPLANT;  Surgeon: Coralyn Mark, MD;  Location: [place] CATH LAB;  Service: Cardiovascular;  Laterality: N/A;     Allergies:  Allergies  Allergen Reactions  . Ultram [Tramadol] Hives  . Buprenex [Buprenorphine Hcl] Other (See Comments)    Heart raced  . Keflex [Cephalexin] Diarrhea  . Other Swelling    Yellow eye dye that is used to check retina. Caused swelling of tongue.  . Augmentin [Amoxicillin-Pot Clavulanate] Nausea And Vomiting  . Hydralazine Hcl Rash    Patient developed facial flushing, palpitations, swelling  in legs.  . Levofloxacin Other (See Comments)    levaquin unknown reaction  . Percocet [Oxycodone-Acetaminophen] Nausea And Vomiting and Other (See Comments)    "felt drained"  . Sulfa Antibiotics Other (See Comments)    unknown  . Tape Other (See Comments)    Adhesive tape causes skin to pull off  . Zetia [Ezetimibe] Other (See Comments)    unknown     Home Medications Prior to Admission medications   Medication Sig Start Date End Date Taking? Authorizing Provider  acetaminophen (TYLENOL) 500 MG tablet Take 500 mg by mouth every 6 (six) hours as needed for mild pain.   Yes Historical Provider, MD  amLODipine (NORVASC) 2.5 MG tablet Take 2.5 mg by mouth daily as needed (BLOOD PRESSURE). Only take if greater  than 140   Yes Historical Provider, MD  aspirin EC 81 MG EC tablet Take 1 tablet (81 mg total) by mouth daily. 01/22/14  Yes Mechele Claude, DO  cholecalciferol (VITAMIN D) 1000 UNITS tablet Take 1,000 Units by mouth daily.   Yes Historical Provider, MD  clopidogrel (PLAVIX) 75 MG tablet take 1 tablet by mouth once daily 01/17/15  Yes [name], MD  hyoscyamine (LEVSIN SL) 0.125 MG SL tablet Place 1 tablet (0.125 mg total) under the tongue every 4 (four) hours as needed. Patient taking differently: Place 0.125 mg under the tongue every 4 (four) hours as needed (STOMACH CRAMPS).  11/06/14  Yes Lafayette Dragon, MD  Melatonin 1 MG SUBL Place 1 mg under the tongue at bedtime.   Yes Historical Provider, MD  Multiple Vitamin (MULTI VITAMIN DAILY PO) Take 1 tablet by mouth every morning.   Yes Historical Provider, MD  nebivolol (BYSTOLIC) 2.5 MG tablet Take 1 tablet (2.5 mg total) by mouth every evening. Patient taking differently: Take 2.5 mg by mouth every evening. Only take if blood pressure is greater than 140 01/02/15  Yes Scott T Weaver, PA-C  nitroGLYCERIN (NITROSTAT) 0.4 MG SL tablet Place 1 tablet (0.4 mg total) under the tongue every 5 (five) minutes as needed for chest pain. 02/05/14  Yes Imogene Burn, PA-C  prednisoLONE acetate (PRED FORTE) 1 % ophthalmic suspension Place 1 drop into both eyes 2 (two) times daily.    Yes Historical Provider, MD  ranitidine (ZANTAC) 150 MG tablet Take 1 tablet (150 mg total) by mouth at bedtime. 11/07/14  Yes Lafayette Dragon, MD     Family History  Problem Relation Age of Onset  . Stroke Mother   . Heart attack Father   . Heart attack Brother   . Cancer Brother   . Heart attack Brother   . Heart attack Brother   . Heart attack Brother   . Parkinsonism Brother      Social History   Social History  . Marital Status: Widowed    Spouse Name: N/A  . Number of Children: 3  . Years of Education: N/A   Occupational History  . retired     Social History Main Topics  . Smoking status: Never Smoker   . Smokeless tobacco: Never Used  . Alcohol Use: No  . Drug Use: No  . Sexual Activity: No   Other Topics Concern  . Not on file   Social History Narrative   Patient is right handed   Patient lives alone.   Patient drinks 1 cup of coffee daily.     Review of Systems: General: negative for chills, fever, night sweats or weight changes.  Cardiovascular: negative for chest pain, dyspnea on exertion, edema, orthopnea, palpitations, paroxysmal nocturnal dyspnea or shortness of breath HEENT: negative for any visual disturbances, blindness, glaucoma, blind OS- Fuch's diesease Dermatological: negative for rash Respiratory: negative for cough, hemoptysis, or wheezing Urologic: negative for hematuria or dysuria Abdominal: negative for nausea, vomiting, diarrhea, bright red blood per rectum, melena, or hematemesis Neurologic: negative for visual changes, syncope, or dizziness. No residual from CVA Musculoskeletal: negative for back pain, joint pain, or swelling Psych: cooperative and appropriate All other systems reviewed and are otherwise negative except as noted above.  Physical Exam: Blood pressure 133/66, pulse 72, temperature 98.3 F (36.8 C), temperature source Oral, resp. rate 19, SpO2 96 %.  General appearance: alert, cooperative and no distress Neck: no carotid bruit and no JVD Lungs: clear to auscultation bilaterally Heart: regular rate and rhythm Abdomen: soft, non-tender; bowel sounds normal; no masses,  no organomegaly Extremities: extremities normal, atraumatic, no cyanosis or edema and varicose veins noted Pulses: 2+ and symmetric Skin: Skin color, texture, turgor normal. No rashes or lesions Neurologic: Grossly normal    Labs:   Results for orders placed or performed during the hospital encounter of 03/05/15 (from the past 24 hour(s))  I-stat troponin, ED (not at [facility])     Status: None    Collection Time: 03/05/15 11:59 AM  Result Value Ref Range   Troponin i, poc 0.01 0.00 - 0.08 ng/mL   Comment 3          Basic metabolic panel     Status: Abnormal   Collection Time: 03/05/15 12:01 PM  Result Value Ref Range   Sodium 139 135 - 145 mmol/L   Potassium 3.5 3.5 - 5.1 mmol/L   Chloride 101 101 - 111 mmol/L   CO2 30 22 - 32 mmol/L   Glucose, Bld 174 (H) 65 - 99 mg/dL   BUN 10 6 - 20 mg/dL   Creatinine, Ser 0.69 0.44 - 1.00 mg/dL   Calcium 9.2 8.9 - 10.3 mg/dL   GFR calc non Af Amer >60 >60 mL/min   GFR calc Af Amer >60 >60 mL/min   Anion gap 8 5 - 15  CBC     Status: Abnormal   Collection Time: 03/05/15 12:01 PM  Result Value Ref Range   WBC 4.2 4.0 - 10.5 K/uL   RBC 4.77 3.87 - 5.11 MIL/uL   Hemoglobin 15.0 12.0 - 15.0 g/dL   HCT 46.4 (H) 36.0 - 46.0 %   MCV 97.3 78.0 - 100.0 fL   MCH 31.4 26.0 - 34.0 pg   MCHC 32.3 30.0 - 36.0 g/dL   RDW 13.4 11.5 - 15.5 %   Platelets 185 150 - 400 K/uL     Radiology/Studies: Dg Chest 2 View  03/05/2015  CLINICAL DATA:  Mid chest pain since this morning. EXAM: CHEST  2 VIEW COMPARISON:  01/19/2014. FINDINGS: The cardiac silhouette, mediastinal and hilar contours are within normal limits and stable. Stable tortuosity and calcification of the thoracic aorta. A loop recorder is noted. The lungs are clear. No pleural effusion. The bony thorax is intact. Stable moderate degenerative changes involving the thoracic spine. IMPRESSION: No acute cardiopulmonary findings. Electronically Signed   By: Marijo Sanes M.D.   On: 03/05/2015 12:12    EKG:NSR without acute changes  ASSESSMENT AND PLAN:  Principal Problem:   Chest pain with moderate risk of acute coronary syndrome Active Problems:   History of PSVT 2013   NSTEMI Nov 2015- conservative  Rx   Essential hypertension   History of Rt brain stroke Oct 2015   Blind left eye- (Fuch's disease)   PLAN: Admit for observation. Consider cath if Troponin's trend significantly positive.     SignedErlene Quan, PA-C 03/05/2015, 2:29 PM [phone]  I have examined the patient and reviewed assessment and plan and discussed with patient.  Agree with above as stated.  She has done well with medical therapy of late.  Some atypical features of her chest discomfort. BP controlled.  If enzymes negative and she feels well, would manage medically.  Long term, she will f/u with Dr. Burt Knack.  Derika Eckles S.

## 2015-03-06 ENCOUNTER — Encounter (HOSPITAL_COMMUNITY): Payer: Self-pay | Admitting: Physician Assistant

## 2015-03-06 DIAGNOSIS — M1990 Unspecified osteoarthritis, unspecified site: Secondary | ICD-10-CM | POA: Diagnosis not present

## 2015-03-06 DIAGNOSIS — I10 Essential (primary) hypertension: Secondary | ICD-10-CM | POA: Diagnosis not present

## 2015-03-06 DIAGNOSIS — Z8679 Personal history of other diseases of the circulatory system: Secondary | ICD-10-CM

## 2015-03-06 DIAGNOSIS — R7303 Prediabetes: Secondary | ICD-10-CM | POA: Diagnosis present

## 2015-03-06 DIAGNOSIS — H5442 Blindness, left eye, normal vision right eye: Secondary | ICD-10-CM | POA: Diagnosis not present

## 2015-03-06 DIAGNOSIS — R079 Chest pain, unspecified: Secondary | ICD-10-CM | POA: Diagnosis not present

## 2015-03-06 DIAGNOSIS — I868 Varicose veins of other specified sites: Secondary | ICD-10-CM | POA: Diagnosis not present

## 2015-03-06 NOTE — Progress Notes (Signed)
Patient Name: [name] [name] Date of Encounter: 03/06/2015     Principal Problem:   Chest pain with moderate risk of acute coronary syndrome Active Problems:   History of PSVT 2013   NSTEMI Nov 2015- conservative Rx   History of Rt brain stroke Oct 2015   Essential hypertension   Blind left eye- (Fuch's disease)    SUBJECTIVE  No further chest pain. Ready to go home.   CURRENT MEDS . aspirin EC  81 mg Oral Daily  . clopidogrel  75 mg Oral Daily  . famotidine  20 mg Oral QHS  . heparin  5,000 Units Subcutaneous 3 times per day  . nebivolol  2.5 mg Oral QPM  . prednisoLONE acetate  1 drop Both Eyes BID  . sodium chloride  3 mL Intravenous Q12H    OBJECTIVE  Filed Vitals:   03/05/15 1400 03/05/15 1543 03/05/15 2126 03/06/15 0621  BP: 129/63 154/63 157/62 158/86  Pulse: 74 77 78 71  Temp:  98.6 F (37 C) 98.2 F (36.8 C) 98 F (36.7 C)  TempSrc:  Oral Oral Oral  Resp: 16 16    Height:  5' 2.5" (1.588 m)    Weight:  171 lb 3.2 oz (77.656 kg)  169 lb 11.2 oz (76.975 kg)  SpO2: 96% 97% 95% 95%    Intake/Output Summary (Last 24 hours) at 03/06/15 0825 Last data filed at 03/06/15 0600  Gross per 24 hour  Intake    420 ml  Output    600 ml  Net   -180 ml   Filed Weights   03/05/15 1543 03/06/15 0621  Weight: 171 lb 3.2 oz (77.656 kg) 169 lb 11.2 oz (76.975 kg)    PHYSICAL EXAM  General: Pleasant, NAD. Neuro: Alert and oriented X 3. Moves all extremities spontaneously. Psych: Normal affect. HEENT:  Normal  Neck: Supple without bruits or JVD. Lungs:  Resp regular and unlabored, CTA. Heart: RRR no s3, s4, or murmurs. Abdomen: Soft, non-tender, non-distended, BS + x 4.  Extremities: No clubbing, cyanosis or edema. DP/PT/Radials 2+ and equal bilaterally.  Accessory Clinical Findings  CBC  Recent Labs  03/05/15 1201  WBC 4.2  HGB 15.0  HCT 46.4*  MCV 97.3  PLT 123XX123   Basic Metabolic Panel  Recent Labs  03/05/15 1201  NA 139  K 3.5  CL 101    CO2 30  GLUCOSE 174*  BUN 10  CREATININE 0.69  CALCIUM 9.2   Cardiac Enzymes  Recent Labs  03/05/15 1530 03/05/15 2048  TROPONINI <0.03 <0.03     Recent Labs  03/05/15 1530  TSH 2.581    TELE  NSR with 11 beats of SVT.  Radiology/Studies  Dg Chest 2 View  03/05/2015  CLINICAL DATA:  Mid chest pain since this morning. EXAM: CHEST  2 VIEW COMPARISON:  01/19/2014. FINDINGS: The cardiac silhouette, mediastinal and hilar contours are within normal limits and stable. Stable tortuosity and calcification of the thoracic aorta. A loop recorder is noted. The lungs are clear. No pleural effusion. The bony thorax is intact. Stable moderate degenerative changes involving the thoracic spine. IMPRESSION: No acute cardiopulmonary findings. Electronically Signed   By: Marijo Sanes M.D.   On: 03/05/2015 12:12    ASSESSMENT AND PLAN  [name] [name] is a 81 y.o. female with a history of CAD, cerebrovascular disease, HTN, remote PSVT 2013, and heart palpitations who presented to [facility] on 03/05/15 with chest pain.   Chest pain/CAD: felt to be atypical.  Troponin neg x2. Chest pain free currently and eager to go home. -- Continue medical management with ASA/plavix, BB   PSVT: she had an 11 beat run of this last night at ~4:25 am. She has a history of this. Continue BB  HTN: moderate control here. Continue sliding scale Amlodipine and Bystolic for HTN. She is apparently very sensitive to changes in her B/P.   Hx of CVA: S/p ILR. Continue ASA, Plavix.   Carotid Stenosis: Consider repeat Carotid US in 01/2016.   Pre-diabetes- Hga1c 6.0 a year ago. No more recent labs but she has had some hyperglycemia here. Follow up with PCP  Dispo- home today with follow up with Dr. Burt Knack.  SignedEileen Stanford PA-C  Pager [phone]  I have examined the patient and reviewed assessment and plan and discussed with patient.  Agree with above as stated.  Negative troponin.  Pain has improved.   She wants to go home.  Continue medical therapy and RF modification.   Dilon Lank S.

## 2015-03-06 NOTE — Care Management Obs Status (Signed)
[facility] NOTIFICATION   Patient Details  Name: [name] [name] MRN: [id number] Date of Birth: 20-Oct-1924   Medicare Observation Status Notification Given:  Yes    Bethena Roys, RN 03/06/2015, 10:02 AM

## 2015-03-06 NOTE — Discharge Summary (Signed)
Discharge Summary   Patient ID: [name] [name] MRN: [id number], DOB/AGE: [birth year] 81 y.o. Admit date: 03/05/2015 D/C date:     03/06/2015  Primary Cardiologist: Dr. Burt Knack  Principal Problem:   Chest pain with moderate risk of acute coronary syndrome Active Problems:   History of PSVT 2013   NSTEMI Nov 2015- conservative Rx   History of Rt brain stroke Oct 2015   Essential hypertension   Blind left eye- (Fuch's disease)   Pre-diabetes    Admission Dates: 03/05/15-03/06/15 Discharge Diagnosis:  Chest pain observation    HPI: [name] [name] is a 81 y.o. female with a history of CAD, cerebrovascular disease, HTN, remote PSVT 2013, and heart palpitations who presented to [facility] on 03/05/15 with chest pain.   She was admitted in 2015 with NSTEMI. She refused cardiac catheterization and was treated conservatively and a nuclear stress test, which demonstrated no scar or ischemia.  EF was preserved. She was treated with ASA + Plavix.  She had a hx of CVA s/p ILR.  Last seen by Dr. [name] 12/13/14.  Patient did complain of generalized weakness and she was asked to monitor VS when this occurs.  She was then admitted 9/28-10/2 with diverticulitis.  She was treated with a combination of Cipro and Flagyl.  Seen by Richardson Dopp PA-C on 01/02/15 and placed on sliding scale Amlodipine and Bystolic for HTN. She is apparently very sensitive to changes in her B/P.   She presented to [facility] on 03/05/15 with atypical chest pain and observed overnight for chest pain rule out.    Hospital Course  Chest pain/CAD: felt to be atypical. Troponin neg x2. Chest pain free currently and eager to go home. -- Continue medical management with ASA/plavix, BB   PSVT: she had an 11 beat run of this last night at ~4:25 am. She has a history of this. Continue BB  HTN: moderate control here. Continue sliding scale Amlodipine and Bystolic for HTN. She is apparently very sensitive to changes in her B/P.   Hx of  CVA: S/p ILR.Continue ASA, Plavix.   Carotid Stenosis: Consider repeat Carotid US in 01/2016.   Pre-diabetes- Hga1c 6.0 a year ago. No more recent labs but she has had some hyperglycemia here. Follow up with PCP  The patient has had an uncomplicated hospital course and is recovering well.  She has been seen by Dr. Irish Lack today and deemed ready for discharge home. All follow-up appointments have been scheduled. Discharge medications are listed below.   Discharge Vitals: Blood pressure 158/86, pulse 71, temperature 98 F (36.7 C), temperature source Oral, resp. rate 16, height 5' 2.5" (1.588 m), weight 169 lb 11.2 oz (76.975 kg), SpO2 95 %.  Labs: Lab Results  Component Value Date   WBC 4.2 03/05/2015   HGB 15.0 03/05/2015   HCT 46.4* 03/05/2015   MCV 97.3 03/05/2015   PLT 185 03/05/2015     Recent Labs Lab 03/05/15 1201  NA 139  K 3.5  CL 101  CO2 30  BUN 10  CREATININE 0.69  CALCIUM 9.2  GLUCOSE 174*    Recent Labs  03/05/15 1530 03/05/15 2048  TROPONINI <0.03 <0.03   Lab Results  Component Value Date   CHOL 158 01/05/2014   HDL 49 01/05/2014   LDLCALC 91 01/05/2014   TRIG 92 01/05/2014    Diagnostic Studies/Procedures   Dg Chest 2 View  03/05/2015  CLINICAL DATA:  Mid chest pain since this morning. EXAM: CHEST  2 VIEW  COMPARISON:  01/19/2014. FINDINGS: The cardiac silhouette, mediastinal and hilar contours are within normal limits and stable. Stable tortuosity and calcification of the thoracic aorta. A loop recorder is noted. The lungs are clear. No pleural effusion. The bony thorax is intact. Stable moderate degenerative changes involving the thoracic spine. IMPRESSION: No acute cardiopulmonary findings. Electronically Signed   By: Marijo Sanes M.D.   On: 03/05/2015 12:12    Discharge Medications     Medication List    TAKE these medications        acetaminophen 500 MG tablet  Commonly known as:  TYLENOL  Take 500 mg by mouth every 6 (six)  hours as needed for mild pain.     amLODipine 2.5 MG tablet  Commonly known as:  NORVASC  Take 2.5 mg by mouth daily as needed (BLOOD PRESSURE). Only take if greater than 140     aspirin 81 MG EC tablet  Take 1 tablet (81 mg total) by mouth daily.     cholecalciferol 1000 UNITS tablet  Commonly known as:  VITAMIN D  Take 1,000 Units by mouth daily.     clopidogrel 75 MG tablet  Commonly known as:  PLAVIX  take 1 tablet by mouth once daily     hyoscyamine 0.125 MG SL tablet  Commonly known as:  LEVSIN SL  Place 1 tablet (0.125 mg total) under the tongue every 4 (four) hours as needed.     Melatonin 1 MG Subl  Place 1 mg under the tongue at bedtime.     MULTI VITAMIN DAILY PO  Take 1 tablet by mouth every morning.     nebivolol 2.5 MG tablet  Commonly known as:  BYSTOLIC  Take 1 tablet (2.5 mg total) by mouth every evening.     nitroGLYCERIN 0.4 MG SL tablet  Commonly known as:  NITROSTAT  Place 1 tablet (0.4 mg total) under the tongue every 5 (five) minutes as needed for chest pain.     prednisoLONE acetate 1 % ophthalmic suspension  Commonly known as:  PRED FORTE  Place 1 drop into both eyes 2 (two) times daily.     ranitidine 150 MG tablet  Commonly known as:  ZANTAC  Take 1 tablet (150 mg total) by mouth at bedtime.        Disposition   The patient will be discharged in stable condition to home.  Follow-up Information    Follow up with Richardson Dopp, PA-C On 04/03/2015.   Specialties:  Physician Assistant, Radiology, Interventional Cardiology   Why:  @ 2pm   Contact information:   [id number] [place] [address] [phone]       Follow up with Marjorie Smolder, MD.   Specialty:  Family Medicine   Why:  Please follow with your primary care doctor    Contact information:   [address] [place] [address] [phone]         Duration of Discharge Encounter: Greater than 30 minutes including physician  and PA time.  SignedAngelena Form R PA-C 03/06/2015, 9:13 AM  I have examined the patient and reviewed assessment and plan and discussed with patient.  Agree with above as stated.  Negative troponin.  Pain has improved. Likely noncardiac cause of discomfort. Would not pursue invasive testing.  F/u with Dr. Burt Knack.  She wants to go home.  Continue medical therapy and RF modification.     Arnola Crittendon S.

## 2015-03-12 ENCOUNTER — Encounter: Payer: Self-pay | Admitting: Cardiovascular Disease

## 2015-03-12 NOTE — Telephone Encounter (Signed)
New message     Patient daughter returning the nurse called

## 2015-03-12 NOTE — Telephone Encounter (Signed)
This encounter was created in error - please disregard.

## 2015-03-15 ENCOUNTER — Other Ambulatory Visit: Payer: Self-pay

## 2015-03-15 MED ORDER — RANITIDINE HCL 150 MG PO TABS: 150.0000 mg | ORAL_TABLET | Freq: Every day | ORAL | Status: DC

## 2015-03-21 ENCOUNTER — Encounter: Payer: Self-pay | Admitting: Cardiovascular Disease

## 2015-03-21 ENCOUNTER — Ambulatory Visit (INDEPENDENT_AMBULATORY_CARE_PROVIDER_SITE_OTHER): Payer: Medicare Other | Admitting: Cardiovascular Disease

## 2015-03-21 VITALS — BP 130/76 | HR 70 | Ht 62.5 in | Wt 171.6 lb

## 2015-03-21 DIAGNOSIS — I471 Supraventricular tachycardia: Secondary | ICD-10-CM | POA: Diagnosis not present

## 2015-03-21 DIAGNOSIS — I10 Essential (primary) hypertension: Secondary | ICD-10-CM

## 2015-03-21 MED ORDER — AMIODARONE HCL 400 MG PO TABS: 400.0000 mg | ORAL_TABLET | Freq: Every day | ORAL | Status: DC

## 2015-03-21 MED ORDER — AMIODARONE HCL 200 MG PO TABS: 200.0000 mg | ORAL_TABLET | Freq: Every day | ORAL | Status: DC

## 2015-03-21 NOTE — Patient Instructions (Signed)
Medication Instructions:  Your physician has recommended you make the following change in your medication:  1. START Amiodarone 400mg  take one tablet by mouth daily for 2 WEEKS and then decrease to 200mg  daily  Labwork: Your physician recommends that you return for lab work in: 3 MONTHS (TSH, Hepatic)   Testing/Procedures: No new orders.   Follow-Up: Your physician recommends that you schedule a follow-up appointment in: 3 MONTHS with Dr Burt Knack   Any Other Special Instructions Will Be Listed Below (If Applicable).     If you need a refill on your cardiac medications before your next appointment, please call your pharmacy.

## 2015-03-22 ENCOUNTER — Encounter: Payer: Self-pay | Admitting: Internal Medicine

## 2015-03-23 NOTE — Progress Notes (Signed)
Cardiology Office Note Date:  03/23/2015   ID:  [name] [name], DOB 23-Jun-1924, MRN [id number]  PCP:  Marjorie Smolder, MD  Cardiologist:  [name], MD    Chief Complaint  Patient presents with  . Coronary Artery Disease    +cp/sob/edema LEFT leg/foot     History of Present Illness: [name] [name] is a 81 y.o. female who presents for follow-up evaluation. She's well-known to me. Has hx NSTEMI managed conservatively in setting of advanced age. Nuclear scan normal. Biggest issues have related to labile BP. We have adjusted medications frequently, most recently having reduced much of her medicine for fatigue, low BP. She has pSVT and has undergone Linq placement. This has recently demonstrated more frrequent SVT and she is asked to come in today for review of treatment options.   She complains of episodes of 'heart racing' most often at night. Also has sudden onset of 'weak spells.' no recent chest pain. No other new complaints. She denies frank syncope.    Past Medical History  Diagnosis Date  . Arthritis   . Blind left eye     a. Corneal transplant x3 with rejection  . Varicose veins   . SVT (supraventricular tachycardia) ([facility])     a. seen by Dr. Caryl Comes  . Helicobacter pylori (H. pylori) 05/22/02    RUT-Positive  . Gastritis   . Diverticulosis   . Stroke ([place])   . Diverticulitis large intestine   . CAD (coronary artery disease)     a. s/p NSTEMI 2015 treated conservatively; nuclear stress test low risk  . Pre-diabetes     Past Surgical History  Procedure Laterality Date  . Cholecystectomy    . Corneal transplant  right 2009, left 2009    both eyes, 3 in left eye, 1 in right eye  . Cataract extraction w/ intraocular lens  implant, bilateral  2005  . Total abdominal hysterectomy  11/1983    "ovaries and all"  . Total knee arthroplasty Left 06/27/2012    Procedure: LEFT TOTAL KNEE ARTHROPLASTY;  Surgeon: Gearlean Alf, MD;  Location: [facility];  Service: Orthopedics;   Laterality: Left;  . Tubal ligation    . Total knee arthroplasty Right 12/12/2012    Procedure: RIGHT TOTAL KNEE ARTHROPLASTY;  Surgeon: Gearlean Alf, MD;  Location: [facility];  Service: Orthopedics;  Laterality: Right;  . Loop recorder implant N/A 01/05/2014    Procedure: LOOP RECORDER IMPLANT;  Surgeon: Coralyn Mark, MD;  Location: [facility];  Service: Cardiovascular;  Laterality: N/A;  . Eye surgery    . Joint replacement    . Tonsillectomy      Current Outpatient Prescriptions  Medication Sig Dispense Refill  . acetaminophen (TYLENOL) 500 MG tablet Take 500 mg by mouth as needed for mild pain or headache.     Marland Kitchen amLODipine (NORVASC) 2.5 MG tablet Take 2.5 mg by mouth daily as needed (BLOOD PRESSURE). Only take if greater than 140    . aspirin EC 81 MG EC tablet Take 1 tablet (81 mg total) by mouth daily.    . cholecalciferol (VITAMIN D) 1000 UNITS tablet Take 1,000 Units by mouth daily.    . clopidogrel (PLAVIX) 75 MG tablet take 1 tablet by mouth once daily 30 tablet 5  . hyoscyamine (LEVSIN, ANASPAZ) 0.125 MG tablet Take 0.125 mg by mouth every 4 (four) hours as needed for bladder spasms or cramping.    . Melatonin 1 MG SUBL Place 1 mg under the tongue  at bedtime.    . Multiple Vitamin (MULTI VITAMIN DAILY PO) Take 1 tablet by mouth every morning.    . nebivolol (BYSTOLIC) 2.5 MG tablet Take 2.5 mg by mouth every evening.    . nitroGLYCERIN (NITROSTAT) 0.4 MG SL tablet Place 1 tablet (0.4 mg total) under the tongue every 5 (five) minutes as needed for chest pain. 25 tablet 3  . prednisoLONE acetate (PRED FORTE) 1 % ophthalmic suspension Place 1 drop into both eyes 2 (two) times daily.     . ranitidine (ZANTAC) 150 MG tablet Take 1 tablet (150 mg total) by mouth at bedtime. 30 tablet 3  . amiodarone (PACERONE) 200 MG tablet Take 1 tablet (200 mg total) by mouth daily. 30 tablet 6  . amiodarone (PACERONE) 400 MG tablet Take 1 tablet (400 mg total) by mouth daily. 14 tablet 0   No  current facility-administered medications for this visit.    Allergies:   Ultram; Buprenex; Keflex; Levofloxacin; Other; Augmentin; Hydralazine hcl; Levofloxacin; Percocet; Sulfa antibiotics; Tape; and Zetia   Social History:  The patient  reports that she has never smoked. She has never used smokeless tobacco. She reports that she does not drink alcohol or use illicit drugs.   Family History:  The patient's  family history includes Cancer in her brother; Heart attack in her brother, brother, brother, brother, and father; Parkinsonism in her brother; Stroke in her mother.    ROS:  Please see the history of present illness.  Otherwise, review of systems is positive for easy bruising.  All other systems are reviewed and negative.    PHYSICAL EXAM: VS:  BP 130/76 mmHg  Pulse 70  Ht 5' 2.5" (1.588 m)  Wt 171 lb 9.6 oz (77.837 kg)  BMI 30.87 kg/m2 , BMI Body mass index is 30.87 kg/(m^2). GEN: Well nourished, well developed, pleasant elderly woman in no acute distress HEENT: normal Neck: no JVD, no masses. No carotid bruits Cardiac: RRR without murmur or gallop                Respiratory:  clear to auscultation bilaterally, normal work of breathing GI: soft, nontender, nondistended, + BS MS: no deformity or atrophy Ext: no pretibial edema, pedal pulses 2+= bilaterally Skin: warm and dry, no rash Neuro:  Strength and sensation are intact Psych: euthymic mood, full affect  EKG:  EKG is ordered today. The ekg ordered today shows NSR 71 bpm, within normal limits  Recent Labs: 12/19/2014: ALT 34 03/05/2015: BUN 10; Creatinine, Ser 0.69; Hemoglobin 15.0; Platelets 185; Potassium 3.5; Sodium 139; TSH 2.581   Lipid Panel     Component Value Date/Time   CHOL 158 01/05/2014 0346   TRIG 92 01/05/2014 0346   HDL 49 01/05/2014 0346   CHOLHDL 3.2 01/05/2014 0346   VLDL 18 01/05/2014 0346   LDLCALC 91 01/05/2014 0346      Wt Readings from Last 3 Encounters:  03/21/15 171 lb 9.6 oz  (77.837 kg)  03/06/15 169 lb 11.2 oz (76.975 kg)  02/05/15 172 lb 9.6 oz (78.291 kg)     Cardiac Studies Reviewed: 2D Echo 01/21/2014: Study Conclusions  - Left ventricle: The cavity size was normal. Systolic function was normal. Wall motion was normal; there were no regional wall motion abnormalities. - Aortic valve: There was mild regurgitation. - Atrial septum: No defect or patent foramen ovale was identified.  ASSESSMENT AND PLAN: 1.  PSVT: discussed options. Have reviewed with Dr Rayann Heman. Suggested amiodarone 400 mg daily x 2  weeks then 200 mg daily. Reviewed potential side effects and plans for monitoring.   2. CAD, presumed. Normal myoview and recent hospital notes reviewed. Occasional chest pain may be related to SVT.  Current medicines are reviewed with the patient today.  The patient does not have concerns regarding medicines.  Labs/ tests ordered today include:   Orders Placed This Encounter  Procedures  . Hepatic function panel  . TSH  . EKG 12-Lead    Disposition:   FU 3 months  Signed, [name], MD  03/23/2015 11:33 AM    [facility] [address], [place], [place]  [address] Phone: [phone]; Fax: [phone]

## 2015-03-26 ENCOUNTER — Encounter: Payer: Self-pay | Admitting: Cardiovascular Disease

## 2015-03-29 ENCOUNTER — Telehealth: Payer: Self-pay | Admitting: Cardiology

## 2015-03-29 NOTE — Telephone Encounter (Signed)
Transmission successfully received.  Episodes printed for review by Dr. Rayann Heman.

## 2015-03-29 NOTE — Telephone Encounter (Signed)
LMOVM for pt to send manual transmission from home monitor.

## 2015-04-01 ENCOUNTER — Ambulatory Visit (INDEPENDENT_AMBULATORY_CARE_PROVIDER_SITE_OTHER): Payer: PPO | Admitting: *Deleted

## 2015-04-01 DIAGNOSIS — I639 Cerebral infarction, unspecified: Secondary | ICD-10-CM | POA: Diagnosis not present

## 2015-04-02 NOTE — Progress Notes (Signed)
Carelink Summary Report / Loop Recorder 

## 2015-04-03 ENCOUNTER — Encounter: Payer: Medicare Other | Admitting: Physician Assistant

## 2015-04-03 LAB — CUP PACEART REMOTE DEVICE CHECK: MDC IDC SESS DTM: 20161210000631

## 2015-04-03 NOTE — Progress Notes (Signed)
Carelink summary report received. Battery status OK. Normal device function. 1 tachy episode, previously reviewed, pt unsure if symptomatic.  No new symptom episodes, brady, or pause episodes. No new AF episodes. Monthly summary reports and ROV/PRN

## 2015-04-10 ENCOUNTER — Encounter: Payer: Self-pay | Admitting: Internal Medicine

## 2015-04-12 ENCOUNTER — Encounter: Payer: Self-pay | Admitting: Internal Medicine

## 2015-04-18 ENCOUNTER — Ambulatory Visit: Payer: Medicare Other | Admitting: Gastroenterology

## 2015-04-29 ENCOUNTER — Ambulatory Visit (INDEPENDENT_AMBULATORY_CARE_PROVIDER_SITE_OTHER): Payer: PPO | Admitting: *Deleted

## 2015-04-29 DIAGNOSIS — I639 Cerebral infarction, unspecified: Secondary | ICD-10-CM | POA: Diagnosis not present

## 2015-04-30 ENCOUNTER — Other Ambulatory Visit: Payer: Self-pay | Admitting: Gastroenterology

## 2015-04-30 MED ORDER — METRONIDAZOLE 500 MG PO TABS: ORAL_TABLET | ORAL | Status: DC

## 2015-04-30 MED ORDER — CIPROFLOXACIN HCL 500 MG PO TABS: ORAL_TABLET | ORAL | Status: DC

## 2015-04-30 NOTE — Telephone Encounter (Signed)
Patient given recommendations. Getting warnings for Cipro and Amiodarone- can cause arrhythmias, Prolong QT. Also, patient lists Levofloxacin as allergy but does not know what it causes. Warning also given for Flagyl and Amiodarone0 Can cause prolonged QT and possible induction of torsades de pontes. Please, advise.

## 2015-04-30 NOTE — Telephone Encounter (Signed)
Patient calling to report left abdominal pain since Saturday night.. Feels like diverticulitis. States she has had 5 stools/day for the last two days. Stools are formed. One episode of nausea that got better when she ate. Denies fever or chills. She is asking if she should start on Cipro and Flagyl. Please, advise.

## 2015-04-30 NOTE — Telephone Encounter (Signed)
Yes if she thinks this is consistent with prior diverticulitis symptoms I would recommend Cipro 500mg  BID and flagyl 500mg  TID for 10 days, especially given her prior hospitalization for this. If she feels worse or is not getting better she should contact us. Can you please ensure no medication interactions. Thanks

## 2015-04-30 NOTE — Telephone Encounter (Signed)
Spoke with patient and she states she is not taking Amiodarone at this time. Per MD ok to order Cipro and Flagyl. Patient aware and confirms again that she is not taking Amiodarone.

## 2015-05-01 ENCOUNTER — Telehealth: Payer: Self-pay | Admitting: Gastroenterology

## 2015-05-01 NOTE — Telephone Encounter (Signed)
Patient read about Cipro and she is worried about taking it due to the side effects. She states she has a pain in her leg and is worried that it is a tendon problem from taking Cipro in the past.

## 2015-05-01 NOTE — Telephone Encounter (Signed)
Error pt of Dr Illene Bolus

## 2015-05-01 NOTE — Progress Notes (Signed)
Carelink Summary Report / Loop Recorder 

## 2015-05-01 NOTE — Telephone Encounter (Signed)
Patient notified of recommendations. 

## 2015-05-01 NOTE — Telephone Encounter (Signed)
She has been on cipro in the past I believe for this. Tendon rupture is a possible side effect however. How is she feeling? She can try just taking flagyl and see if it helps. If her symptoms persist I would recommend she take the cipro. Thanks

## 2015-05-08 ENCOUNTER — Telehealth: Payer: Self-pay | Admitting: Gastroenterology

## 2015-05-08 NOTE — Telephone Encounter (Signed)
Spoke with patient and told her Flagyl would not be causing this.

## 2015-05-21 ENCOUNTER — Encounter: Payer: Self-pay | Admitting: Internal Medicine

## 2015-05-22 DIAGNOSIS — H4311 Vitreous hemorrhage, right eye: Secondary | ICD-10-CM | POA: Diagnosis not present

## 2015-05-22 LAB — CUP PACEART REMOTE DEVICE CHECK: Date Time Interrogation Session: 20170103050500

## 2015-05-22 NOTE — Progress Notes (Signed)
Carelink summary report received. Battery status OK. Normal device function. No new symptom episodes, brady, or pause episodes. No new AF episodes. 9 tachy episodes appear SVT, no known symptoms .Monthly summary reports and ROV/PRN

## 2015-05-23 DIAGNOSIS — H43811 Vitreous degeneration, right eye: Secondary | ICD-10-CM | POA: Diagnosis not present

## 2015-05-23 DIAGNOSIS — R0789 Other chest pain: Secondary | ICD-10-CM | POA: Diagnosis not present

## 2015-05-27 DIAGNOSIS — H01009 Unspecified blepharitis unspecified eye, unspecified eyelid: Secondary | ICD-10-CM | POA: Diagnosis not present

## 2015-05-27 DIAGNOSIS — H35371 Puckering of macula, right eye: Secondary | ICD-10-CM | POA: Diagnosis not present

## 2015-05-29 ENCOUNTER — Encounter: Payer: Self-pay | Admitting: Internal Medicine

## 2015-05-30 ENCOUNTER — Ambulatory Visit (INDEPENDENT_AMBULATORY_CARE_PROVIDER_SITE_OTHER): Payer: PPO | Admitting: *Deleted

## 2015-05-30 DIAGNOSIS — I639 Cerebral infarction, unspecified: Secondary | ICD-10-CM | POA: Diagnosis not present

## 2015-05-31 NOTE — Progress Notes (Signed)
Carelink Summary Report / Loop Recorder 

## 2015-06-01 LAB — CUP PACEART REMOTE DEVICE CHECK: MDC IDC SESS DTM: 20170304050500

## 2015-06-01 NOTE — Progress Notes (Signed)
Carelink summary report received. Battery status OK. Normal device function. No new symptom episodes, brady, or pause episodes. No new AF episodes. Tachy episodes previously reviewed - SVT, amio started. Monthly summary reports and ROV/PRN

## 2015-06-06 DIAGNOSIS — H00024 Hordeolum internum left upper eyelid: Secondary | ICD-10-CM | POA: Diagnosis not present

## 2015-06-21 ENCOUNTER — Encounter: Payer: Self-pay | Admitting: Cardiovascular Disease

## 2015-06-21 ENCOUNTER — Ambulatory Visit (INDEPENDENT_AMBULATORY_CARE_PROVIDER_SITE_OTHER): Payer: PPO | Admitting: Cardiovascular Disease

## 2015-06-21 VITALS — BP 120/60 | HR 76 | Ht 62.5 in | Wt 172.6 lb

## 2015-06-21 DIAGNOSIS — I471 Supraventricular tachycardia: Secondary | ICD-10-CM

## 2015-06-21 NOTE — Patient Instructions (Signed)
Medication Instructions:  Your physician recommends that you continue on your current medications as directed. Please refer to the Current Medication list given to you today.  You can hold plavix 5 days prior to dermatology procedure. Resume after procedure.   Labwork: No new orders.   Testing/Procedures: No new orders.   Follow-Up: Your physician wants you to follow-up in: 6 MONTHS with Dr Burt Knack.  You will receive a reminder letter in the mail two months in advance. If you don't receive a letter, please call our office to schedule the follow-up appointment.   Any Other Special Instructions Will Be Listed Below (If Applicable).     If you need a refill on your cardiac medications before your next appointment, please call your pharmacy.

## 2015-06-21 NOTE — Progress Notes (Signed)
Cardiology Office Note Date:  06/22/2015   ID:  [name] [name], DOB 01-03-1925, MRN [id number]  PCP:  Marjorie Smolder, MD  Cardiologist:  [name], MD    Chief Complaint  Patient presents with  . Coronary Artery Disease    c/o le edema,palpitations, sob with activity and wheezing when bending over     History of Present Illness: [name] [name] is a 82 y.o. female who presents for follow-up evaluation. She's well-known to me. Has hx NSTEMI managed conservatively in setting of advanced age. Nuclear scan normal. Biggest issues have related to labile BP. We have adjusted medications frequently, most recently having reduced much of her medicine for fatigue, low BP. She has pSVT and has undergone Linq placement showing pSVT. At the time of her last evaluation December 2016 her monitor showed episodes of PSVT. Amiodarone was recommended but she declined because of the side effect profile. She's doing better in this regard and has no palpitations today. She denies chest pain or pressure. She does admit to shortness of breath with exertion and bending forward.   Past Medical History  Diagnosis Date  . Arthritis   . Blind left eye     a. Corneal transplant x3 with rejection  . Varicose veins   . SVT (supraventricular tachycardia) ([place])     a. seen by Dr. Caryl Comes  . Helicobacter pylori (H. pylori) 05/22/02    RUT-Positive  . Gastritis   . Diverticulosis   . Stroke ([place])   . Diverticulitis large intestine   . CAD (coronary artery disease)     a. s/p NSTEMI 2015 treated conservatively; nuclear stress test low risk  . Pre-diabetes     Past Surgical History  Procedure Laterality Date  . Cholecystectomy    . Corneal transplant  right 2009, left 2009    both eyes, 3 in left eye, 1 in right eye  . Cataract extraction w/ intraocular lens  implant, bilateral  2005  . Total abdominal hysterectomy  11/1983    "ovaries and all"  . Total knee arthroplasty Left 06/27/2012    Procedure: LEFT TOTAL  KNEE ARTHROPLASTY;  Surgeon: Gearlean Alf, MD;  Location: [facility];  Service: Orthopedics;  Laterality: Left;  . Tubal ligation    . Total knee arthroplasty Right 12/12/2012    Procedure: RIGHT TOTAL KNEE ARTHROPLASTY;  Surgeon: Gearlean Alf, MD;  Location: [facility];  Service: Orthopedics;  Laterality: Right;  . Loop recorder implant N/A 01/05/2014    Procedure: LOOP RECORDER IMPLANT;  Surgeon: Coralyn Mark, MD;  Location: [facility];  Service: Cardiovascular;  Laterality: N/A;  . Eye surgery    . Joint replacement    . Tonsillectomy      Current Outpatient Prescriptions  Medication Sig Dispense Refill  . acetaminophen (TYLENOL) 500 MG tablet Take 500 mg by mouth as needed for mild pain or headache.     Marland Kitchen amLODipine (NORVASC) 2.5 MG tablet Take 2.5 mg by mouth daily as needed (BLOOD PRESSURE). Only take if greater than 140    . aspirin EC 81 MG EC tablet Take 1 tablet (81 mg total) by mouth daily.    . cholecalciferol (VITAMIN D) 1000 UNITS tablet Take 1,000 Units by mouth daily.    . clopidogrel (PLAVIX) 75 MG tablet take 1 tablet by mouth once daily 30 tablet 5  . hyoscyamine (LEVSIN, ANASPAZ) 0.125 MG tablet Take 0.125 mg by mouth every 4 (four) hours as needed for bladder spasms or cramping.    Marland Kitchen  Melatonin 1 MG SUBL Place 1 mg under the tongue at bedtime.    . Multiple Vitamin (MULTI VITAMIN DAILY PO) Take 1 tablet by mouth every morning.    . nebivolol (BYSTOLIC) 2.5 MG tablet Take 2.5 mg by mouth every evening.    . nitroGLYCERIN (NITROSTAT) 0.4 MG SL tablet Place 1 tablet (0.4 mg total) under the tongue every 5 (five) minutes as needed for chest pain. 25 tablet 3  . prednisoLONE acetate (PRED FORTE) 1 % ophthalmic suspension Place 1 drop into both eyes 2 (two) times daily.     . ranitidine (ZANTAC) 150 MG tablet Take 1 tablet (150 mg total) by mouth at bedtime. 30 tablet 3   No current facility-administered medications for this visit.    Allergies:   Ultram; Buprenex; Keflex;  Levofloxacin; Other; Augmentin; Hydralazine hcl; Levofloxacin; Percocet; Sulfa antibiotics; Tape; and Zetia   Social History:  The patient  reports that she has never smoked. She has never used smokeless tobacco. She reports that she does not drink alcohol or use illicit drugs.   Family History:  The patient's family history includes Cancer in her brother; Heart attack in her brother, brother, brother, brother, and father; Parkinsonism in her brother; Stroke in her mother.    ROS:  Please see the history of present illness.  Otherwise, review of systems is positive for left foot swelling, shortness of breath with activity, easy bruising.  All other systems are reviewed and negative.    PHYSICAL EXAM: VS:  BP 120/60 mmHg  Pulse 76  Ht 5' 2.5" (1.588 m)  Wt 172 lb 9.6 oz (78.291 kg)  BMI 31.05 kg/m2 , BMI Body mass index is 31.05 kg/(m^2).  BP on my check is 160/80 GEN: Well nourished, well developed, pleasant elderly woman in no acute distress HEENT: normal Neck: no JVD, no masses. No carotid bruits Cardiac: RRR without murmur or gallop                Respiratory:  clear to auscultation bilaterally, normal work of breathing GI: soft, nontender, nondistended, + BS MS: no deformity or atrophy Ext: Trace pretibial edema, pedal pulses 2+= bilaterally Skin: warm and dry, no rash Neuro:  Strength and sensation are intact Psych: euthymic mood, full affect  EKG:  EKG is not ordered today.  Recent Labs: 12/19/2014: ALT 34 03/05/2015: BUN 10; Creatinine, Ser 0.69; Hemoglobin 15.0; Platelets 185; Potassium 3.5; Sodium 139; TSH 2.581   Lipid Panel     Component Value Date/Time   CHOL 158 01/05/2014 0346   TRIG 92 01/05/2014 0346   HDL 49 01/05/2014 0346   CHOLHDL 3.2 01/05/2014 0346   VLDL 18 01/05/2014 0346   LDLCALC 91 01/05/2014 0346      Wt Readings from Last 3 Encounters:  06/21/15 172 lb 9.6 oz (78.291 kg)  03/21/15 171 lb 9.6 oz (77.837 kg)  03/06/15 169 lb 11.2 oz (76.975  kg)    ASSESSMENT AND PLAN: 1.  PSVT: Continue by systolic. Amiodarone recommended last office visit but she declined. Seems like her heart rhythm has settled down. I will see her back in 6 months.  2. CAD, presumed: No anginal symptoms. She may require a minor skin surgery and it would be okay for her Plavix to be held for 5 days prior to this.  Current medicines are reviewed with the patient today.  The patient does not have concerns regarding medicines.  Labs/ tests ordered today include:  No orders of the defined types were  placed in this encounter.    Disposition:   FU 6 months  Signed, [name], MD  06/22/2015 7:29 AM    [address], [place], [place]  [address] Phone: [phone]; Fax: [phone]

## 2015-06-27 DIAGNOSIS — L570 Actinic keratosis: Secondary | ICD-10-CM | POA: Diagnosis not present

## 2015-06-27 DIAGNOSIS — D0439 Carcinoma in situ of skin of other parts of face: Secondary | ICD-10-CM | POA: Diagnosis not present

## 2015-06-27 DIAGNOSIS — D485 Neoplasm of uncertain behavior of skin: Secondary | ICD-10-CM | POA: Diagnosis not present

## 2015-06-27 DIAGNOSIS — C44729 Squamous cell carcinoma of skin of left lower limb, including hip: Secondary | ICD-10-CM | POA: Diagnosis not present

## 2015-06-27 DIAGNOSIS — L821 Other seborrheic keratosis: Secondary | ICD-10-CM | POA: Diagnosis not present

## 2015-06-27 DIAGNOSIS — Z85828 Personal history of other malignant neoplasm of skin: Secondary | ICD-10-CM | POA: Diagnosis not present

## 2015-06-27 LAB — CUP PACEART REMOTE DEVICE CHECK: MDC IDC SESS DTM: 20170208003950

## 2015-06-27 NOTE — Progress Notes (Signed)
[facility] summary report received. Battery status OK. Normal device function. No new symptom episodes, brady, or pause episodes. No new AF episodes. 1 tachy- previously addressed. Monthly summary reports and ROV/PRN 

## 2015-06-28 ENCOUNTER — Telehealth: Payer: Self-pay | Admitting: Gastroenterology

## 2015-06-28 MED ORDER — DOXYCYCLINE HYCLATE 50 MG PO CAPS: ORAL_CAPSULE | ORAL | Status: DC

## 2015-06-28 MED ORDER — METRONIDAZOLE 500 MG PO TABS: ORAL_TABLET | ORAL | Status: DC

## 2015-06-28 NOTE — Telephone Encounter (Signed)
Doxycycline 100 mg bid and metronidazole 500 mg tid x 10 d

## 2015-06-28 NOTE — Telephone Encounter (Signed)
Spoke with patient and told her recommendations. She will pick up rx's/

## 2015-06-28 NOTE — Telephone Encounter (Signed)
Patient calling to report left side pain x 2 days. States it is worse at night but is always there. Hx diverticulitis. She reports she is having soft BM's frequently. (4 times yesterday) Denies nausea, vomiting or fever. She states she does not want to take Cipro but agrees to take Flagyl. Please, advise.

## 2015-06-28 NOTE — Telephone Encounter (Signed)
rx's sent. Line busy will try again later.

## 2015-07-01 ENCOUNTER — Ambulatory Visit (INDEPENDENT_AMBULATORY_CARE_PROVIDER_SITE_OTHER): Payer: PPO | Admitting: *Deleted

## 2015-07-01 DIAGNOSIS — I639 Cerebral infarction, unspecified: Secondary | ICD-10-CM

## 2015-07-01 NOTE — Progress Notes (Signed)
Carelink Summary Report / Loop Recorder 

## 2015-07-08 ENCOUNTER — Telehealth: Payer: Self-pay

## 2015-07-08 MED ORDER — RANITIDINE HCL 150 MG PO TABS: 150.0000 mg | ORAL_TABLET | Freq: Every day | ORAL | Status: DC

## 2015-07-08 NOTE — Telephone Encounter (Signed)
That's okay, you can refill Zantac. Thanks

## 2015-07-08 NOTE — Telephone Encounter (Signed)
New RX sent with 6 refills.

## 2015-07-08 NOTE — Telephone Encounter (Signed)
Incoming fax for medication refill. Rx needed Ranitidine 150 mg. One tab at bedtime #30. Pt has a follow up recall for July/Aug.

## 2015-07-09 ENCOUNTER — Telehealth: Payer: Self-pay | Admitting: Cardiovascular Disease

## 2015-07-09 NOTE — Telephone Encounter (Signed)
Disregard earlier note from 11:21.  Computer logged off before documentation was complete.  I spoke with [name] and the pt forgot to take her bystolic last night. BP this morning 157/77, pulse 73 and the pt is complaining of feeling her heart pounding.  The pt does have PRN Amlodipine and I instructed [name] that the pt can take this medication to help her BP.  I advised that the pt should take bystolic tonight as regularly scheduled. [name] agreed with plan.

## 2015-07-09 NOTE — Telephone Encounter (Signed)
I spoke with [name] [name] and the pt forgot to take her bystolic last night.  BP this morning 157/77, pulse 73 and the pt is complaining of feeling her heart pounding.  The pt   157/77 73 9:20 AM

## 2015-07-09 NOTE — Telephone Encounter (Addendum)
Pt forgot to take her Bystolic last night, she did experience rapid heart beat but contributed that to drinking caffinated tea-wants to know id she should take one this am, two tonight, or just continue with one tonight as usual? Pls advise [phone] dtr [name]

## 2015-07-17 ENCOUNTER — Other Ambulatory Visit: Payer: Self-pay | Admitting: *Deleted

## 2015-07-17 ENCOUNTER — Telehealth: Payer: Self-pay | Admitting: Cardiovascular Disease

## 2015-07-17 MED ORDER — CLOPIDOGREL BISULFATE 75 MG PO TABS: 75.0000 mg | ORAL_TABLET | Freq: Every day | ORAL | Status: DC

## 2015-07-17 NOTE — Telephone Encounter (Signed)
I spoke with the pt's daughter and she said the pt had an episode of CP that was unlike anything she has felt in the past.  This occurred at 5:30 AM lasted 15 minutes and the pt told [name] everything went black and she felt like she was going to pass out. The pt did not pass out. The pt took 2 NTG for her symptoms and when [name] arrived the pt was diaphoretic and the CP was easing off. At this time the pt is only having a dull ache in her chest and no other symptoms.  I advised [name] that I will forward this information to Dr Burt Knack for further review.

## 2015-07-17 NOTE — Telephone Encounter (Signed)
If pain free now, would have her come in for a Flex appt tomorrow with an EKG and troponin. If recurrent chest pain needs to go directly to the ER for assessment.   thx  [name] 07/17/2015 4:45 PM

## 2015-07-17 NOTE — Telephone Encounter (Signed)
I spoke with the pt's daughter and the pt is pain free at this time.  I have scheduled appt tomorrow with Dr Acie Fredrickson DOD and advised that the pt go to the ER with any further CP.  [name] agreed with plan.

## 2015-07-17 NOTE — Telephone Encounter (Signed)
Pt c/o of Chest Pain: 1. Are you having CP right now? No, but dull ache-had pain at 530am . Are you experiencing any other symptoms (ex. SOB, nausea, vomiting, sweating)? None-did this am about 530am 3. How long have you been experiencing CP? 15 min 4. Is your CP continuous or coming and going? Continuous for 15 min 5. Have you taken Nitroglycerin? 510a, then took 2nd one 515am  Feels better now except for the dull ache and daughter [name] asking if this is normal after chest pain if not should she take another Nitro-pls advise

## 2015-07-18 ENCOUNTER — Ambulatory Visit (INDEPENDENT_AMBULATORY_CARE_PROVIDER_SITE_OTHER): Payer: PPO | Admitting: Cardiovascular Disease

## 2015-07-18 ENCOUNTER — Encounter: Payer: Self-pay | Admitting: Cardiovascular Disease

## 2015-07-18 VITALS — BP 160/90 | HR 81 | Ht 62.5 in | Wt 171.6 lb

## 2015-07-18 DIAGNOSIS — I209 Angina pectoris, unspecified: Secondary | ICD-10-CM | POA: Diagnosis not present

## 2015-07-18 DIAGNOSIS — I10 Essential (primary) hypertension: Secondary | ICD-10-CM | POA: Diagnosis not present

## 2015-07-18 DIAGNOSIS — I471 Supraventricular tachycardia: Secondary | ICD-10-CM | POA: Diagnosis not present

## 2015-07-18 DIAGNOSIS — I25119 Atherosclerotic heart disease of native coronary artery with unspecified angina pectoris: Secondary | ICD-10-CM

## 2015-07-18 LAB — TROPONIN I: Troponin I: <0.03 ng/mL (ref <0.031)

## 2015-07-18 NOTE — Progress Notes (Signed)
Cardiology Office Note Date:  07/18/2015   ID:  [name] [name], DOB 1924-06-20, MRN [id number]  PCP:  Marjorie Smolder, MD  Cardiologist:  [name], MD   No chief complaint on file.  Problem list 1. Supraventricular tachycardia 2. History of stroke 3. NSTEMI - no ischemia on myoview    History of Present Illness: [name] [name] is a 82 y.o. female who presents for further evaluation for some episodes of CP.  Has been seen by Dr. Burt Knack.   Has hx NSTEMI managed conservatively in setting of advanced age. Nuclear scan normal. Biggest issues have related to labile BP. We have adjusted medications frequently, most recently having reduced much of her medicine for fatigue, low BP. She has pSVT and has undergone Linq placement showing pSVT. At the time of her last evaluation December 2016 her monitor showed episodes of PSVT. Amiodarone was recommended but she declined because of the side effect profile. She's doing better in this regard and has no palpitations today.    April 27. 2017  Presents today with an episode of fast HR yesterday AM Associated with some chest pain , sharp chest pain  Lasted 15 minutes.  Took SL NTG ,  Became very light headed at some point.   Does not think that she had taken the SL NTG prior to getting dizziness.   2 weeks ago , she had another episode of SVT.   She took 2 SL NTG with resolution of the CP and tachyardia  Within several minutes.   Takes her meds.   Missed a dose of bystolic on rare occasion.   Takes a very low dose of Bystolic. Has tried a higher dose of Bystolic in the past but did not tolerate it (fatigue, hypotension)  Past Medical History  Diagnosis Date  . Arthritis   . Blind left eye     a. Corneal transplant x3 with rejection  . Varicose veins   . SVT (supraventricular tachycardia) ([place])     a. seen by Dr. Caryl Comes  . Helicobacter pylori (H. pylori) 05/22/02    RUT-Positive  . Gastritis   . Diverticulosis   . Stroke ([place])   .  Diverticulitis large intestine   . CAD (coronary artery disease)     a. s/p NSTEMI 2015 treated conservatively; nuclear stress test low risk  . Pre-diabetes     Past Surgical History  Procedure Laterality Date  . Cholecystectomy    . Corneal transplant  right 2009, left 2009    both eyes, 3 in left eye, 1 in right eye  . Cataract extraction w/ intraocular lens  implant, bilateral  2005  . Total abdominal hysterectomy  11/1983    "ovaries and all"  . Total knee arthroplasty Left 06/27/2012    Procedure: LEFT TOTAL KNEE ARTHROPLASTY;  Surgeon: Gearlean Alf, MD;  Location: [facility];  Service: Orthopedics;  Laterality: Left;  . Tubal ligation    . Total knee arthroplasty Right 12/12/2012    Procedure: RIGHT TOTAL KNEE ARTHROPLASTY;  Surgeon: Gearlean Alf, MD;  Location: [facility];  Service: Orthopedics;  Laterality: Right;  . Loop recorder implant N/A 01/05/2014    Procedure: LOOP RECORDER IMPLANT;  Surgeon: Coralyn Mark, MD;  Location: [facility];  Service: Cardiovascular;  Laterality: N/A;  . Eye surgery    . Joint replacement    . Tonsillectomy      Current Outpatient Prescriptions  Medication Sig Dispense Refill  . amLODipine (NORVASC) 2.5 MG tablet Take  2.5 mg by mouth daily as needed (BLOOD PRESSURE). Only take if greater than 140    . aspirin EC 81 MG EC tablet Take 1 tablet (81 mg total) by mouth daily.    . cholecalciferol (VITAMIN D) 1000 UNITS tablet Take 1,000 Units by mouth daily.    . clopidogrel (PLAVIX) 75 MG tablet Take 1 tablet (75 mg total) by mouth daily. 90 tablet 3  . hyoscyamine (LEVSIN, ANASPAZ) 0.125 MG tablet Take 0.125 mg by mouth every 4 (four) hours as needed for bladder spasms or cramping.    . Melatonin 1 MG SUBL Place 1 mg under the tongue at bedtime.    . Multiple Vitamin (MULTI VITAMIN DAILY PO) Take 1 tablet by mouth every morning.    . nebivolol (BYSTOLIC) 2.5 MG tablet Take 2.5 mg by mouth every evening.    . nitroGLYCERIN (NITROSTAT) 0.4 MG SL  tablet Place 1 tablet (0.4 mg total) under the tongue every 5 (five) minutes as needed for chest pain. 25 tablet 3  . prednisoLONE acetate (PRED FORTE) 1 % ophthalmic suspension Place 1 drop into both eyes 2 (two) times daily.     . ranitidine (ZANTAC) 150 MG tablet Take 1 tablet (150 mg total) by mouth at bedtime. 30 tablet 6   No current facility-administered medications for this visit.    Allergies:   Ultram; Buprenex; Keflex; Levofloxacin; Other; Augmentin; Hydralazine hcl; Levofloxacin; Percocet; Sulfa antibiotics; Tape; and Zetia   Social History:  The patient  reports that she has never smoked. She has never used smokeless tobacco. She reports that she does not drink alcohol or use illicit drugs.   Family History:  The patient's family history includes Cancer in her brother; Heart attack in her brother, brother, brother, brother, and father; Parkinsonism in her brother; Stroke in her mother.    ROS:  Please see the history of present illness.  Otherwise, review of systems is positive for left foot swelling, shortness of breath with activity, easy bruising.  All other systems are reviewed and negative.    PHYSICAL EXAM: VS:  BP 160/90 mmHg  Pulse 81  Ht 5' 2.5" (1.588 m)  Wt 171 lb 9.6 oz (77.837 kg)  BMI 30.87 kg/m2  SpO2 97% , BMI Body mass index is 30.87 kg/(m^2).  BP on my check is 160/80 GEN: Well nourished, well developed, pleasant elderly woman in no acute distress HEENT: normal Neck: no JVD, no masses. No carotid bruits Cardiac: RRR without murmur or gallop                Respiratory:  clear to auscultation bilaterally, normal work of breathing GI: soft, nontender, nondistended, + BS MS: no deformity or atrophy Ext: Trace pretibial edema, pedal pulses 2+= bilaterally Skin: warm and dry, no rash Neuro:  Strength and sensation are intact Psych: euthymic mood, full affect  EKG:  EKG is ordered today. NSR at 81.  Normal ECG   Recent Labs: 12/19/2014: ALT  34 03/05/2015: BUN 10; Creatinine, Ser 0.69; Hemoglobin 15.0; Platelets 185; Potassium 3.5; Sodium 139; TSH 2.581   Lipid Panel     Component Value Date/Time   CHOL 158 01/05/2014 0346   TRIG 92 01/05/2014 0346   HDL 49 01/05/2014 0346   CHOLHDL 3.2 01/05/2014 0346   VLDL 18 01/05/2014 0346   LDLCALC 91 01/05/2014 0346      Wt Readings from Last 3 Encounters:  07/18/15 171 lb 9.6 oz (77.837 kg)  06/21/15 172 lb 9.6 oz (78.291  kg)  03/21/15 171 lb 9.6 oz (77.837 kg)    ASSESSMENT AND PLAN: 1.  PSVT:    Continues to have some issues with PSVT.  Has tried higher doses of bystolic  - did not tolerate it   2. CAD, she has had recurrent episodes of angina like symptoms . Has not tolerated isosorbide in the past. She's also tried additional beta blockers.  Does not tolerate many medications.    I suggested that we proceed with cardiac catheterization.  She's very concerned about possible compensation's related to the cardiac cath. She's fairly healthy and does like for the yard and garden work.   She is already on aspirin and Plavix. I think that the risk of cardiac catheterization her would be relatively low. I've suggested that we set her up with a cath in the next week or so with Dr. Burt Knack. She wanted to think about it and discuss more with her family.  Troponin level today .   Current medicines are reviewed with the patient today.  The patient does not have concerns regarding medicines.  Labs/ tests ordered today include:  No orders of the defined types were placed in this encounter.    Time spent :   50 minutes - exam , discussion with patient and  discussion with family .  Signed, Nahser, Wonda Cheng, MD  07/18/2015 10:54 AM    [address], [place], [place]  [address] Phone: [phone]; Fax: [phone]

## 2015-07-18 NOTE — Patient Instructions (Addendum)
Medication Instructions:  Your physician recommends that you continue on your current medications as directed. Please refer to the Current Medication list given to you today.   Labwork: TODAY - troponin   Testing/Procedures: None Ordered   Follow-Up: Your physician recommends that you schedule a follow-up appointment in: 4 weeks with Dr. Burt Knack - someone from our office will call you to schedule '  If you need a refill on your cardiac medications before your next appointment, please call your pharmacy.   Thank you for choosing [facility]! Christen Bame, RN [phone]

## 2015-07-22 ENCOUNTER — Encounter: Payer: Self-pay | Admitting: Cardiovascular Disease

## 2015-07-22 NOTE — Progress Notes (Signed)
Lengthy discussion with patient. Agree she should have cath to evaluate recurrent chest pain. She would like [place] to talk with her dughter, [name]. I tried to call her but no answer on cell. Will try to touch base later in the week. Agree with recent note of Dr Acie Fredrickson and think best to clarify situation with definite test, i.e. Cardiac cath possible PCI.  [name] 07/22/2015 3:55 PM

## 2015-07-24 ENCOUNTER — Encounter: Payer: Self-pay | Admitting: Gastroenterology

## 2015-07-24 ENCOUNTER — Telehealth: Payer: Self-pay | Admitting: Cardiovascular Disease

## 2015-07-24 NOTE — Progress Notes (Signed)
15 min phone call  I spoke with the pt's daughter and she said the pt is hesitant now to proceed with cardiac catheterization.  She said that the pt has heard some bad things about the procedure and that a person died during the procedure.  I made [name] aware that there are risks with an invasive procedure and I am happy to speak with the pt again about this information. [name] will be at the pt's home later today and will call back into the office so that I can speak with the pt at that time.      3:45 PM    I spoke with the pt, daughter and son in regards to cardiac catheterization and answered questions in regards to risk involved with this procedure.  At this time the pt would like to proceed with cardiac catheterization.  I have arranged procedure on 07/31/15 and made family aware.

## 2015-07-24 NOTE — Telephone Encounter (Signed)
Follow up    Daughter [name] calling back to speak with nurse regarding Dr. Burt Knack recommendation

## 2015-07-24 NOTE — Telephone Encounter (Signed)
[name] ( daughter) is returning a call . Please call .Marland Kitchen  Thanks

## 2015-07-25 NOTE — Telephone Encounter (Signed)
Pt would like a call back she has questions about her CATH please.

## 2015-07-25 NOTE — Telephone Encounter (Signed)
Spoke with pt and she states that her daughter in law wanted to know if she would be "laid up for mother's day". Advised pt that if she has an intervention done that she will have to spend the night. Advised there is always the possibility of a longer stay but we don't really know until the doctor actually does the cath. Pt verbalized understanding and was appreciative for return call.

## 2015-07-26 ENCOUNTER — Telehealth: Payer: Self-pay | Admitting: Gastroenterology

## 2015-07-26 MED ORDER — METRONIDAZOLE 250 MG PO TABS: 250.0000 mg | ORAL_TABLET | Freq: Three times a day (TID) | ORAL | Status: DC

## 2015-07-26 NOTE — Telephone Encounter (Signed)
Patient reports that she has a history of diverticulitis.  She reports that she is having a slight ache in her lower left side that has been present for 3 days and getting gradually worse. She denies fever, tenderness, no pain with ambulation.  She feels this pain is consistent with previous episodes of diverticulitis.  She is requesting a rx of flagyl to have on hand if her symptoms worsen.  She is advised to start a clear liquid diet for 24 hours then can advance to a full liquid diet if pain improves tomorrow.  Discussed with Alonza Bogus, PA will send RX for flagyl 250 mg TID for 10 days.  She will call back if her symptoms fail to improve.

## 2015-07-27 ENCOUNTER — Other Ambulatory Visit: Payer: Self-pay | Admitting: Cardiovascular Disease

## 2015-07-27 DIAGNOSIS — R072 Precordial pain: Secondary | ICD-10-CM

## 2015-07-29 ENCOUNTER — Encounter: Payer: Self-pay | Admitting: Internal Medicine

## 2015-07-29 ENCOUNTER — Telehealth: Payer: Self-pay | Admitting: Cardiovascular Disease

## 2015-07-29 ENCOUNTER — Ambulatory Visit (INDEPENDENT_AMBULATORY_CARE_PROVIDER_SITE_OTHER): Payer: PPO | Admitting: *Deleted

## 2015-07-29 DIAGNOSIS — I639 Cerebral infarction, unspecified: Secondary | ICD-10-CM

## 2015-07-29 NOTE — Telephone Encounter (Signed)
I spoke with the pt and made her aware of new date for procedure.  The pt currently has a flare-up of diverticulitis and is taking Flagyl.

## 2015-07-29 NOTE — Telephone Encounter (Signed)
New message      Having a cath this wed.  Patient is calling to reschedule it until 08-07-15.  Please call

## 2015-07-29 NOTE — Telephone Encounter (Signed)
Procedure rescheduled to 08/07/15 at 11:30 with Dr Burt Knack.  Pt will arrive at 9:30.  I have left the pt a message to contact the office.

## 2015-07-30 ENCOUNTER — Telehealth: Payer: Self-pay | Admitting: Gastroenterology

## 2015-07-30 NOTE — Telephone Encounter (Signed)
Yes I would increase to 500mg  TID for 10 days total. If no improvement or worsening she needs to call back. Thanks

## 2015-07-30 NOTE — Progress Notes (Signed)
Carelink Summary Report / Loop Recorder 

## 2015-07-30 NOTE — Telephone Encounter (Signed)
Spoke with daughter and patient is taking Flagyl 250 mg and is some better but still having some diarrhea and abdominal pain. She states the patient usually gets the Flagyl 500 mg to take. Patient is scheduled for f/u OV on 09/11/15 since this is the 3rd episode in as many months. Please, advise.

## 2015-07-31 MED ORDER — METRONIDAZOLE 500 MG PO TABS: ORAL_TABLET | ORAL | Status: DC

## 2015-07-31 NOTE — Telephone Encounter (Signed)
Patient's daughter notified and rx sent to pharmacy.

## 2015-08-05 ENCOUNTER — Telehealth: Payer: Self-pay | Admitting: *Deleted

## 2015-08-05 ENCOUNTER — Encounter: Payer: Self-pay | Admitting: Internal Medicine

## 2015-08-05 NOTE — Telephone Encounter (Signed)
Called patient regarding tachy episode on LINQ transmission from 08/03/15.  Patient reports waking up around the time of the episode ("middle of the night", 0147 per device) due to feeling her heart racing and strong substernal chest pain.  Patient reports taking nitroglycerin SL x2 and her symptoms diminished at this point.  Patient is scheduled for a cardiac cath on 08/07/15.  Advised patient that I will review the episode with Dr. Rayann Heman and call her back if he has any additional recommendations.  Advised patient to proceed to ED if she has further chest pain prior to 5/17.  Patient verbalizes understanding of instructions and denies additional questions or concerns at this time.

## 2015-08-05 NOTE — Telephone Encounter (Signed)
Called patient to ensure that she still does not want to pursue amiodarone therapy for her SVT.  Patient declines, stating that she is concerned about the side effects and she wants to wait for recommendations from Dr. Burt Knack following her cardiac cath on 08/07/15.  Dr. Rayann Heman aware of patient's preferences at this time.  Patient denies additional questions or concerns.

## 2015-08-07 ENCOUNTER — Ambulatory Visit (HOSPITAL_COMMUNITY)
Admission: RE | Admit: 2015-08-07 | Discharge: 2015-08-08 | Disposition: A | Discharge status: Home / Self Care | Payer: PPO | Source: Ambulatory Visit | Attending: Cardiovascular Disease | Admitting: Cardiovascular Disease

## 2015-08-07 ENCOUNTER — Encounter (HOSPITAL_COMMUNITY)
Admission: RE | Disposition: A | Discharge status: Home / Self Care | Payer: Self-pay | Source: Ambulatory Visit | Attending: Cardiovascular Disease

## 2015-08-07 ENCOUNTER — Encounter (HOSPITAL_COMMUNITY): Payer: Self-pay | Admitting: Cardiovascular Disease

## 2015-08-07 ENCOUNTER — Other Ambulatory Visit: Payer: Self-pay

## 2015-08-07 DIAGNOSIS — Z947 Corneal transplant status: Secondary | ICD-10-CM | POA: Diagnosis not present

## 2015-08-07 DIAGNOSIS — Z7902 Long term (current) use of antithrombotics/antiplatelets: Secondary | ICD-10-CM | POA: Insufficient documentation

## 2015-08-07 DIAGNOSIS — Z955 Presence of coronary angioplasty implant and graft: Secondary | ICD-10-CM

## 2015-08-07 DIAGNOSIS — R7303 Prediabetes: Secondary | ICD-10-CM | POA: Diagnosis not present

## 2015-08-07 DIAGNOSIS — Z96653 Presence of artificial knee joint, bilateral: Secondary | ICD-10-CM | POA: Diagnosis not present

## 2015-08-07 DIAGNOSIS — I471 Supraventricular tachycardia: Secondary | ICD-10-CM | POA: Insufficient documentation

## 2015-08-07 DIAGNOSIS — Z7982 Long term (current) use of aspirin: Secondary | ICD-10-CM | POA: Diagnosis not present

## 2015-08-07 DIAGNOSIS — I25118 Atherosclerotic heart disease of native coronary artery with other forms of angina pectoris: Secondary | ICD-10-CM | POA: Insufficient documentation

## 2015-08-07 DIAGNOSIS — I252 Old myocardial infarction: Secondary | ICD-10-CM | POA: Diagnosis not present

## 2015-08-07 DIAGNOSIS — Z8673 Personal history of transient ischemic attack (TIA), and cerebral infarction without residual deficits: Secondary | ICD-10-CM | POA: Insufficient documentation

## 2015-08-07 DIAGNOSIS — I2089 Other forms of angina pectoris: Secondary | ICD-10-CM | POA: Diagnosis present

## 2015-08-07 DIAGNOSIS — Z79899 Other long term (current) drug therapy: Secondary | ICD-10-CM | POA: Diagnosis not present

## 2015-08-07 DIAGNOSIS — R072 Precordial pain: Secondary | ICD-10-CM

## 2015-08-07 DIAGNOSIS — I208 Other forms of angina pectoris: Secondary | ICD-10-CM | POA: Diagnosis present

## 2015-08-07 HISTORY — DX: Basal cell carcinoma of skin, unspecified: C44.91

## 2015-08-07 HISTORY — DX: Allergic rhinitis due to pollen: J30.1

## 2015-08-07 HISTORY — DX: Gastro-esophageal reflux disease without esophagitis: K21.9

## 2015-08-07 HISTORY — DX: Essential (primary) hypertension: I10

## 2015-08-07 HISTORY — PX: CARDIAC CATHETERIZATION: SHX172

## 2015-08-07 HISTORY — DX: Acute myocardial infarction, unspecified: I21.9

## 2015-08-07 HISTORY — DX: Reserved for concepts with insufficient information to code with codable children: IMO0002

## 2015-08-07 LAB — CBC
HEMATOCRIT: 45 % (ref 36.0–46.0)
Hemoglobin: 14.7 g/dL (ref 12.0–15.0)
MCH: 31.3 pg (ref 26.0–34.0)
MCHC: 32.7 g/dL (ref 30.0–36.0)
MCV: 95.7 fL (ref 78.0–100.0)
Platelets: 160 10*3/uL (ref 150–400)
RBC: 4.7 MIL/uL (ref 3.87–5.11)
RDW: 13.4 % (ref 11.5–15.5)
WBC: 4.3 10*3/uL (ref 4.0–10.5)

## 2015-08-07 LAB — BASIC METABOLIC PANEL
Anion gap: 12 (ref 5–15)
BUN: 14 mg/dL (ref 6–20)
CO2: 28 mmol/L (ref 22–32)
Calcium: 9.6 mg/dL (ref 8.9–10.3)
Chloride: 101 mmol/L (ref 101–111)
Creatinine, Ser: 0.75 mg/dL (ref 0.44–1.00)
GFR calc Af Amer: >60 mL/min (ref >60)
GLUCOSE: 114 mg/dL — AB (ref 65–99)
POTASSIUM: 4 mmol/L (ref 3.5–5.1)
Sodium: 141 mmol/L (ref 135–145)

## 2015-08-07 LAB — TROPONIN I

## 2015-08-07 LAB — POCT ACTIVATED CLOTTING TIME: Activated Clotting Time: 446 seconds

## 2015-08-07 LAB — PROTIME-INR
INR: 0.99 (ref 0.00–1.49)
Prothrombin Time: 13.3 seconds (ref 11.6–15.2)

## 2015-08-07 SURGERY — LEFT HEART CATH AND CORONARY ANGIOGRAPHY

## 2015-08-07 MED ORDER — SODIUM CHLORIDE 0.9 % IV SOLN
250.0000 mg | INTRAVENOUS | Status: DC | PRN
  Administered 2015-08-07: 1.75 mg/kg/h via INTRAVENOUS

## 2015-08-07 MED ORDER — LIDOCAINE HCL (PF) 1 % IJ SOLN
INTRAMUSCULAR | Status: AC
  Filled 2015-08-07: qty 30

## 2015-08-07 MED ORDER — SODIUM CHLORIDE 0.9 % IV SOLN: 250.0000 mL | INTRAVENOUS | Status: DC | PRN

## 2015-08-07 MED ORDER — MELATONIN 3 MG PO TABS
3.0000 mg | ORAL_TABLET | Freq: Every day | ORAL | Status: DC
  Administered 2015-08-07: 3 mg via ORAL
  Filled 2015-08-07: qty 1

## 2015-08-07 MED ORDER — MIDAZOLAM HCL 2 MG/2ML IJ SOLN
INTRAMUSCULAR | Status: DC | PRN
  Administered 2015-08-07 (×3): 1 mg via INTRAVENOUS

## 2015-08-07 MED ORDER — HEPARIN SODIUM (PORCINE) 1000 UNIT/ML IJ SOLN
INTRAMUSCULAR | Status: AC
  Filled 2015-08-07: qty 1

## 2015-08-07 MED ORDER — SODIUM CHLORIDE 0.9 % WEIGHT BASED INFUSION
3.0000 mL/kg/h | INTRAVENOUS | Status: AC
  Administered 2015-08-07: 3 mL/kg/h via INTRAVENOUS

## 2015-08-07 MED ORDER — IOPAMIDOL (ISOVUE-370) INJECTION 76%
INTRAVENOUS | Status: AC
  Filled 2015-08-07: qty 125

## 2015-08-07 MED ORDER — SODIUM CHLORIDE 0.9% FLUSH: 3.0000 mL | Freq: Two times a day (BID) | INTRAVENOUS | Status: DC

## 2015-08-07 MED ORDER — ASPIRIN 81 MG PO CHEW: 81.0000 mg | CHEWABLE_TABLET | ORAL | Status: DC

## 2015-08-07 MED ORDER — MIDAZOLAM HCL 2 MG/2ML IJ SOLN
INTRAMUSCULAR | Status: AC
  Filled 2015-08-07: qty 2

## 2015-08-07 MED ORDER — HYOSCYAMINE SULFATE 0.125 MG PO TABS
0.1250 mg | ORAL_TABLET | ORAL | Status: DC | PRN
  Filled 2015-08-07: qty 1

## 2015-08-07 MED ORDER — BIVALIRUDIN 250 MG IV SOLR
INTRAVENOUS | Status: AC
  Filled 2015-08-07: qty 250

## 2015-08-07 MED ORDER — HEPARIN (PORCINE) IN NACL 2-0.9 UNIT/ML-% IJ SOLN
INTRAMUSCULAR | Status: DC | PRN
  Administered 2015-08-07: 1000 mL

## 2015-08-07 MED ORDER — MELATONIN 1 MG PO TABS
1.0000 mg | ORAL_TABLET | Freq: Every day | ORAL | Status: DC
  Filled 2015-08-07: qty 1

## 2015-08-07 MED ORDER — HEPARIN (PORCINE) IN NACL 2-0.9 UNIT/ML-% IJ SOLN
INTRAMUSCULAR | Status: AC
  Filled 2015-08-07: qty 1000

## 2015-08-07 MED ORDER — VERAPAMIL HCL 2.5 MG/ML IV SOLN
INTRAVENOUS | Status: AC
  Filled 2015-08-07: qty 2

## 2015-08-07 MED ORDER — FENTANYL CITRATE (PF) 100 MCG/2ML IJ SOLN
INTRAMUSCULAR | Status: AC
  Filled 2015-08-07: qty 2

## 2015-08-07 MED ORDER — MELATONIN 1 MG SL SUBL: 1.0000 mg | SUBLINGUAL_TABLET | Freq: Every day | SUBLINGUAL | Status: DC

## 2015-08-07 MED ORDER — NITROGLYCERIN 1 MG/10 ML FOR IR/CATH LAB
INTRA_ARTERIAL | Status: AC
  Filled 2015-08-07: qty 10

## 2015-08-07 MED ORDER — FENTANYL CITRATE (PF) 100 MCG/2ML IJ SOLN
INTRAMUSCULAR | Status: DC | PRN
  Administered 2015-08-07: 50 ug via INTRAVENOUS
  Administered 2015-08-07: 25 ug via INTRAVENOUS
  Administered 2015-08-07: 50 ug via INTRAVENOUS
  Administered 2015-08-07: 25 ug via INTRAVENOUS

## 2015-08-07 MED ORDER — SODIUM CHLORIDE 0.9 % WEIGHT BASED INFUSION: 1.0000 mL/kg/h | INTRAVENOUS | Status: DC

## 2015-08-07 MED ORDER — NITROGLYCERIN 1 MG/10 ML FOR IR/CATH LAB
INTRA_ARTERIAL | Status: DC | PRN
  Administered 2015-08-07: 150 ug via INTRACORONARY

## 2015-08-07 MED ORDER — NEBIVOLOL HCL 2.5 MG PO TABS
2.5000 mg | ORAL_TABLET | Freq: Every evening | ORAL | Status: DC
  Administered 2015-08-07: 22:00:00 2.5 mg via ORAL
  Filled 2015-08-07: qty 1

## 2015-08-07 MED ORDER — ADULT MULTIVITAMIN W/MINERALS CH
ORAL_TABLET | Freq: Every morning | ORAL | Status: DC
  Administered 2015-08-08: 10:00:00 1 via ORAL
  Filled 2015-08-07: qty 1

## 2015-08-07 MED ORDER — SODIUM CHLORIDE 0.9 % IV SOLN: INTRAVENOUS | Status: AC

## 2015-08-07 MED ORDER — ACETAMINOPHEN 325 MG PO TABS
650.0000 mg | ORAL_TABLET | Freq: Four times a day (QID) | ORAL | Status: DC | PRN
  Administered 2015-08-07 – 2015-08-08 (×2): 650 mg via ORAL
  Filled 2015-08-07 (×2): qty 2

## 2015-08-07 MED ORDER — FAMOTIDINE 20 MG PO TABS
20.0000 mg | ORAL_TABLET | Freq: Every day | ORAL | Status: DC
Start: 2015-08-08 — End: 2015-08-08
  Administered 2015-08-08: 20 mg via ORAL
  Filled 2015-08-07: qty 1

## 2015-08-07 MED ORDER — ONDANSETRON HCL 4 MG/2ML IJ SOLN: 4.0000 mg | Freq: Four times a day (QID) | INTRAMUSCULAR | Status: DC | PRN

## 2015-08-07 MED ORDER — ANGIOPLASTY BOOK
Freq: Once | Status: AC
  Administered 2015-08-07: 21:00:00
  Filled 2015-08-07: qty 1

## 2015-08-07 MED ORDER — SODIUM CHLORIDE 0.9% FLUSH: 3.0000 mL | INTRAVENOUS | Status: DC | PRN

## 2015-08-07 MED ORDER — CLOPIDOGREL BISULFATE 75 MG PO TABS
75.0000 mg | ORAL_TABLET | Freq: Every day | ORAL | Status: DC
  Administered 2015-08-08: 10:00:00 75 mg via ORAL
  Filled 2015-08-07: qty 1

## 2015-08-07 MED ORDER — PREDNISOLONE ACETATE 1 % OP SUSP
1.0000 [drp] | Freq: Two times a day (BID) | OPHTHALMIC | Status: DC
  Administered 2015-08-07 – 2015-08-08 (×2): 1 [drp] via OPHTHALMIC
  Filled 2015-08-07: qty 1

## 2015-08-07 MED ORDER — SODIUM CHLORIDE 0.9% FLUSH
3.0000 mL | Freq: Two times a day (BID) | INTRAVENOUS | Status: DC
Start: 2015-08-07 — End: 2015-08-08

## 2015-08-07 MED ORDER — VERAPAMIL HCL 2.5 MG/ML IV SOLN
INTRAVENOUS | Status: DC | PRN
  Administered 2015-08-07 (×2): via INTRA_ARTERIAL

## 2015-08-07 MED ORDER — BIVALIRUDIN BOLUS VIA INFUSION - CUPID
INTRAVENOUS | Status: DC | PRN
  Administered 2015-08-07: 57.825 mg via INTRAVENOUS

## 2015-08-07 MED ORDER — ADENOSINE 12 MG/4ML IV SOLN
16.0000 mL | Freq: Once | INTRAVENOUS | Status: DC
  Filled 2015-08-07 (×2): qty 16

## 2015-08-07 MED ORDER — AMLODIPINE BESYLATE 5 MG PO TABS: 2.5000 mg | ORAL_TABLET | Freq: Every day | ORAL | Status: DC | PRN

## 2015-08-07 MED ORDER — ASPIRIN EC 81 MG PO TBEC
81.0000 mg | DELAYED_RELEASE_TABLET | Freq: Every day | ORAL | Status: DC
  Administered 2015-08-08: 81 mg via ORAL
  Filled 2015-08-07: qty 1

## 2015-08-07 MED ORDER — LIDOCAINE HCL (PF) 1 % IJ SOLN
INTRAMUSCULAR | Status: DC | PRN
  Administered 2015-08-07: 2 mL via SUBCUTANEOUS
  Administered 2015-08-07: 10 mL
  Administered 2015-08-07: 2 mL

## 2015-08-07 MED ORDER — HEPARIN SODIUM (PORCINE) 1000 UNIT/ML IJ SOLN
INTRAMUSCULAR | Status: DC | PRN
Start: 2015-08-07 — End: 2015-08-07
  Administered 2015-08-07: 4000 [IU] via INTRAVENOUS

## 2015-08-07 SURGICAL SUPPLY — 31 items
BALLN EMERGE MR 2.0X12 (BALLOONS) ×3
BALLN ~~LOC~~ EMERGE MR 2.5X8 (BALLOONS) ×3
BALLOON EMERGE MR 2.0X12 (BALLOONS) ×1 IMPLANT
BALLOON ~~LOC~~ EMERGE MR 2.5X8 (BALLOONS) ×1 IMPLANT
BIOFREEDOM 2.5X14 (Permanent Stent) ×3 IMPLANT
CATH INFINITI 4FR JL 4.0 (CATHETERS) ×3 IMPLANT
CATH INFINITI 5 FR JL3.5 (CATHETERS) ×3 IMPLANT
CATH INFINITI 5FR JL4 (CATHETERS) ×3 IMPLANT
CATH INFINITI JR4 5F (CATHETERS) ×3 IMPLANT
CATH LAUNCHER 5F EBU3.5 (CATHETERS) ×3 IMPLANT
CATH LAUNCHER 5F JR4 (CATHETERS) ×3 IMPLANT
CATH VISTA GUIDE 6FR XB3.5 (CATHETERS) ×3 IMPLANT
CATH VISTA GUIDE 6FR XBLAD4 (CATHETERS) ×3 IMPLANT
DEVICE CLOSURE PERCLS PRGLD 6F (VASCULAR PRODUCTS) ×1 IMPLANT
DEVICE RAD COMP TR BAND LRG (VASCULAR PRODUCTS) ×6 IMPLANT
GLIDESHEATH SLEND SS 6F .021 (SHEATH) ×6 IMPLANT
GUIDEWIRE PRESSURE COMET II (WIRE) ×3 IMPLANT
KIT ENCORE 26 ADVANTAGE (KITS) ×3 IMPLANT
KIT ESSENTIALS PG (KITS) ×3 IMPLANT
KIT HEART LEFT (KITS) ×3 IMPLANT
PACK CARDIAC CATHETERIZATION (CUSTOM PROCEDURE TRAY) ×3 IMPLANT
PERCLOSE PROGLIDE 6F (VASCULAR PRODUCTS) ×3
SHEATH PINNACLE 4F 10CM (SHEATH) ×3 IMPLANT
SHEATH PINNACLE 6F 10CM (SHEATH) ×3 IMPLANT
TRANSDUCER W/STOPCOCK (MISCELLANEOUS) ×3 IMPLANT
TUBING CIL FLEX 10 FLL-RA (TUBING) ×3 IMPLANT
WIRE COUGAR XT STRL 190CM (WIRE) ×3 IMPLANT
WIRE EMERALD 3MM-J .035X150CM (WIRE) ×3 IMPLANT
WIRE HI TORQ VERSACORE-J 145CM (WIRE) ×3 IMPLANT
WIRE ROSEN-J .035X260CM (WIRE) ×3 IMPLANT
WIRE SAFE-T 1.5MM-J .035X260CM (WIRE) ×6 IMPLANT

## 2015-08-07 NOTE — Interval H&P Note (Signed)
History and Physical Interval Note:  08/07/2015 11:32 AM  [name] [name]  has presented today for surgery, with the diagnosis of cp/cad  The various methods of treatment have been discussed with the patient and family. After consideration of risks, benefits and other options for treatment, the patient has consented to  Procedure(s): Left Heart Cath and Coronary Angiography (N/A) as a surgical intervention .  The patient's history has been reviewed, patient examined, no change in status, stable for surgery.  I have reviewed the patient's chart and labs.  Questions were answered to the patient's satisfaction.     [name]

## 2015-08-07 NOTE — Research (Signed)
LEADERS FREE II Research Study Informed Consent   Subject Name: [name] [name]  Subject met inclusion and exclusion criteria.  The informed consent form, study requirements and expectations were reviewed with the subject and questions and concerns were addressed prior to the signing of the consent form.  The subject verbalized understanding of the trial requirements.  The subject agreed to participate in the LEADERS FREE II trial and signed the informed consent at 1105 on 08/07/2015.  The informed consent was obtained prior to performance of any protocol-specific procedures for the subject.  A copy of the signed informed consent was given to the subject and a copy was placed in the subject's medical record. The subject will be enrolled if angiographic criteria are meet.  Blossom Hoops 08/07/2015, 11:13 AM

## 2015-08-07 NOTE — H&P (View-Only) (Signed)
Cardiology Office Note Date:  07/18/2015   ID:  [name] [name], DOB 1924-06-20, MRN [id number]  PCP:  Marjorie Smolder, MD  Cardiologist:  [name], MD   No chief complaint on file.  Problem list 1. Supraventricular tachycardia 2. History of stroke 3. NSTEMI - no ischemia on myoview    History of Present Illness: [name] [name] is a 82 y.o. female who presents for further evaluation for some episodes of CP.  Has been seen by Dr. Burt Knack.   Has hx NSTEMI managed conservatively in setting of advanced age. Nuclear scan normal. Biggest issues have related to labile BP. We have adjusted medications frequently, most recently having reduced much of her medicine for fatigue, low BP. She has pSVT and has undergone Linq placement showing pSVT. At the time of her last evaluation December 2016 her monitor showed episodes of PSVT. Amiodarone was recommended but she declined because of the side effect profile. She's doing better in this regard and has no palpitations today.    April 27. 2017  Presents today with an episode of fast HR yesterday AM Associated with some chest pain , sharp chest pain  Lasted 15 minutes.  Took SL NTG ,  Became very light headed at some point.   Does not think that she had taken the SL NTG prior to getting dizziness.   2 weeks ago , she had another episode of SVT.   She took 2 SL NTG with resolution of the CP and tachyardia  Within several minutes.   Takes her meds.   Missed a dose of bystolic on rare occasion.   Takes a very low dose of Bystolic. Has tried a higher dose of Bystolic in the past but did not tolerate it (fatigue, hypotension)  Past Medical History  Diagnosis Date  . Arthritis   . Blind left eye     a. Corneal transplant x3 with rejection  . Varicose veins   . SVT (supraventricular tachycardia) ([place])     a. seen by Dr. Caryl Comes  . Helicobacter pylori (H. pylori) 05/22/02    RUT-Positive  . Gastritis   . Diverticulosis   . Stroke ([place])   .  Diverticulitis large intestine   . CAD (coronary artery disease)     a. s/p NSTEMI 2015 treated conservatively; nuclear stress test low risk  . Pre-diabetes     Past Surgical History  Procedure Laterality Date  . Cholecystectomy    . Corneal transplant  right 2009, left 2009    both eyes, 3 in left eye, 1 in right eye  . Cataract extraction w/ intraocular lens  implant, bilateral  2005  . Total abdominal hysterectomy  11/1983    "ovaries and all"  . Total knee arthroplasty Left 06/27/2012    Procedure: LEFT TOTAL KNEE ARTHROPLASTY;  Surgeon: Gearlean Alf, MD;  Location: [facility];  Service: Orthopedics;  Laterality: Left;  . Tubal ligation    . Total knee arthroplasty Right 12/12/2012    Procedure: RIGHT TOTAL KNEE ARTHROPLASTY;  Surgeon: Gearlean Alf, MD;  Location: [facility];  Service: Orthopedics;  Laterality: Right;  . Loop recorder implant N/A 01/05/2014    Procedure: LOOP RECORDER IMPLANT;  Surgeon: Coralyn Mark, MD;  Location: [facility];  Service: Cardiovascular;  Laterality: N/A;  . Eye surgery    . Joint replacement    . Tonsillectomy      Current Outpatient Prescriptions  Medication Sig Dispense Refill  . amLODipine (NORVASC) 2.5 MG tablet Take  2.5 mg by mouth daily as needed (BLOOD PRESSURE). Only take if greater than 140    . aspirin EC 81 MG EC tablet Take 1 tablet (81 mg total) by mouth daily.    . cholecalciferol (VITAMIN D) 1000 UNITS tablet Take 1,000 Units by mouth daily.    . clopidogrel (PLAVIX) 75 MG tablet Take 1 tablet (75 mg total) by mouth daily. 90 tablet 3  . hyoscyamine (LEVSIN, ANASPAZ) 0.125 MG tablet Take 0.125 mg by mouth every 4 (four) hours as needed for bladder spasms or cramping.    . Melatonin 1 MG SUBL Place 1 mg under the tongue at bedtime.    . Multiple Vitamin (MULTI VITAMIN DAILY PO) Take 1 tablet by mouth every morning.    . nebivolol (BYSTOLIC) 2.5 MG tablet Take 2.5 mg by mouth every evening.    . nitroGLYCERIN (NITROSTAT) 0.4 MG SL  tablet Place 1 tablet (0.4 mg total) under the tongue every 5 (five) minutes as needed for chest pain. 25 tablet 3  . prednisoLONE acetate (PRED FORTE) 1 % ophthalmic suspension Place 1 drop into both eyes 2 (two) times daily.     . ranitidine (ZANTAC) 150 MG tablet Take 1 tablet (150 mg total) by mouth at bedtime. 30 tablet 6   No current facility-administered medications for this visit.    Allergies:   Ultram; Buprenex; Keflex; Levofloxacin; Other; Augmentin; Hydralazine hcl; Levofloxacin; Percocet; Sulfa antibiotics; Tape; and Zetia   Social History:  The patient  reports that she has never smoked. She has never used smokeless tobacco. She reports that she does not drink alcohol or use illicit drugs.   Family History:  The patient's family history includes Cancer in her brother; Heart attack in her brother, brother, brother, brother, and father; Parkinsonism in her brother; Stroke in her mother.    ROS:  Please see the history of present illness.  Otherwise, review of systems is positive for left foot swelling, shortness of breath with activity, easy bruising.  All other systems are reviewed and negative.    PHYSICAL EXAM: VS:  BP 160/90 mmHg  Pulse 81  Ht 5' 2.5" (1.588 m)  Wt 171 lb 9.6 oz (77.837 kg)  BMI 30.87 kg/m2  SpO2 97% , BMI Body mass index is 30.87 kg/(m^2).  BP on my check is 160/80 GEN: Well nourished, well developed, pleasant elderly woman in no acute distress HEENT: normal Neck: no JVD, no masses. No carotid bruits Cardiac: RRR without murmur or gallop                Respiratory:  clear to auscultation bilaterally, normal work of breathing GI: soft, nontender, nondistended, + BS MS: no deformity or atrophy Ext: Trace pretibial edema, pedal pulses 2+= bilaterally Skin: warm and dry, no rash Neuro:  Strength and sensation are intact Psych: euthymic mood, full affect  EKG:  EKG is ordered today. NSR at 81.  Normal ECG   Recent Labs: 12/19/2014: ALT  34 03/05/2015: BUN 10; Creatinine, Ser 0.69; Hemoglobin 15.0; Platelets 185; Potassium 3.5; Sodium 139; TSH 2.581   Lipid Panel     Component Value Date/Time   CHOL 158 01/05/2014 0346   TRIG 92 01/05/2014 0346   HDL 49 01/05/2014 0346   CHOLHDL 3.2 01/05/2014 0346   VLDL 18 01/05/2014 0346   LDLCALC 91 01/05/2014 0346      Wt Readings from Last 3 Encounters:  07/18/15 171 lb 9.6 oz (77.837 kg)  06/21/15 172 lb 9.6 oz (78.291   kg)  03/21/15 171 lb 9.6 oz (77.837 kg)    ASSESSMENT AND PLAN: 1.  PSVT:    Continues to have some issues with PSVT.  Has tried higher doses of bystolic  - did not tolerate it   2. CAD, she has had recurrent episodes of angina like symptoms . Has not tolerated isosorbide in the past. She's also tried additional beta blockers.  Does not tolerate many medications.    I suggested that we proceed with cardiac catheterization.  She's very concerned about possible compensation's related to the cardiac cath. She's fairly healthy and does like for the yard and garden work.   She is already on aspirin and Plavix. I think that the risk of cardiac catheterization her would be relatively low. I've suggested that we set her up with a cath in the next week or so with Dr. Burt Knack. She wanted to think about it and discuss more with her family.  Troponin level today .   Current medicines are reviewed with the patient today.  The patient does not have concerns regarding medicines.  Labs/ tests ordered today include:  No orders of the defined types were placed in this encounter.    Time spent :   50 minutes - exam , discussion with patient and  discussion with family .  Signed, Nahser, Wonda Cheng, MD  07/18/2015 10:54 AM    [facility] [address], [place], [place]  [address] Phone: [phone]; Fax: [phone]

## 2015-08-08 ENCOUNTER — Encounter (HOSPITAL_COMMUNITY): Payer: Self-pay | Admitting: Cardiovascular Disease

## 2015-08-08 DIAGNOSIS — I252 Old myocardial infarction: Secondary | ICD-10-CM | POA: Diagnosis not present

## 2015-08-08 DIAGNOSIS — I25110 Atherosclerotic heart disease of native coronary artery with unstable angina pectoris: Secondary | ICD-10-CM | POA: Diagnosis not present

## 2015-08-08 DIAGNOSIS — Z947 Corneal transplant status: Secondary | ICD-10-CM | POA: Diagnosis not present

## 2015-08-08 DIAGNOSIS — I25118 Atherosclerotic heart disease of native coronary artery with other forms of angina pectoris: Secondary | ICD-10-CM | POA: Diagnosis not present

## 2015-08-08 DIAGNOSIS — I471 Supraventricular tachycardia: Secondary | ICD-10-CM | POA: Diagnosis not present

## 2015-08-08 LAB — CBC
HEMATOCRIT: 41.9 % (ref 36.0–46.0)
Hemoglobin: 13.5 g/dL (ref 12.0–15.0)
MCH: 30.9 pg (ref 26.0–34.0)
MCHC: 32.2 g/dL (ref 30.0–36.0)
MCV: 95.9 fL (ref 78.0–100.0)
Platelets: 157 10*3/uL (ref 150–400)
RBC: 4.37 MIL/uL (ref 3.87–5.11)
RDW: 13.4 % (ref 11.5–15.5)
WBC: 5.5 10*3/uL (ref 4.0–10.5)

## 2015-08-08 LAB — BASIC METABOLIC PANEL
Anion gap: 8 (ref 5–15)
BUN: 12 mg/dL (ref 6–20)
CHLORIDE: 104 mmol/L (ref 101–111)
CO2: 27 mmol/L (ref 22–32)
Calcium: 8.6 mg/dL — ABNORMAL LOW (ref 8.9–10.3)
Creatinine, Ser: 0.67 mg/dL (ref 0.44–1.00)
Glucose, Bld: 106 mg/dL — ABNORMAL HIGH (ref 65–99)
POTASSIUM: 4 mmol/L (ref 3.5–5.1)
SODIUM: 139 mmol/L (ref 135–145)

## 2015-08-08 LAB — TROPONIN I: Troponin I: 0.35 ng/mL — ABNORMAL HIGH (ref <0.031)

## 2015-08-08 MED ORDER — NITROGLYCERIN 0.4 MG SL SUBL: 0.4000 mg | SUBLINGUAL_TABLET | SUBLINGUAL | Status: DC | PRN

## 2015-08-08 MED ORDER — ATORVASTATIN CALCIUM 10 MG PO TABS: 10.0000 mg | ORAL_TABLET | Freq: Every day | ORAL | Status: DC

## 2015-08-08 NOTE — Progress Notes (Signed)
CARDIAC REHAB PHASE I   PRE:  Rate/Rhythm: 73 SR  BP:  Supine: 147/59  Sitting:   Standing:    SaO2:   MODE:  Ambulation: 300 ft   POST:  Rate/Rhythm: 86 SR  BP:  Supine: 169/44  Sitting:   Standing:    SaO2:  0830-0915 Pt walked 300 ft with hand held asst. Gait steady. Education completed with pt and son who voiced understanding. Stressed importance of plavix with stent. Reviewed NTG use, heart healthy diet, ex ed and  CRP 2. Will refer to program in [place] and pt will consider. She does not drive. Has been doing ex in pool and will discuss with cardiologist at next visit of when she can return.   Graylon Good, RN BSN  08/08/2015 9:09 AM

## 2015-08-08 NOTE — Discharge Summary (Signed)
Discharge Summary    Patient ID: [name] [name],  MRN: [id number], DOB/AGE: [id number] 80 y.o.  Admit date: 08/07/2015 Discharge date: 08/08/2015  Primary Care Provider: Darcus Austin RUTH Primary Cardiologist: Dr. Acie Fredrickson  Discharge Diagnoses    Active Problems:   Other forms of angina pectoris (HCC)   Allergies Allergies  Allergen Reactions  . Other Anaphylaxis, Shortness Of Breath and Swelling    Yellow eye dye that is used to check retina. Caused swelling of tongue.  Marland Kitchen Ultram [Tramadol] Hives  . Buprenex [Buprenorphine Hcl] Other (See Comments)    Heart raced  . Keflex [Cephalexin] Diarrhea  . Levofloxacin Other (See Comments)  . Augmentin [Amoxicillin-Pot Clavulanate] Nausea And Vomiting  . Hydralazine Hcl Swelling, Palpitations and Rash    Patient developed facial flushing, swelling in legs.  . Levofloxacin Other (See Comments)    levaquin unknown reaction  . Percocet [Oxycodone-Acetaminophen] Nausea And Vomiting and Other (See Comments)    "felt drained"  . Sulfa Antibiotics Other (See Comments)    unknown  . Tape Other (See Comments)    Adhesive tape causes skin to pull off  . Zetia [Ezetimibe] Other (See Comments)    unknown    Diagnostic Studies/Procedures    LHC 08/07/15 Procedures    Coronary Stent Intervention   Intravascular Pressure Wire/FFR Study   Left Heart Cath and Coronary Angiography    Conclusion     Ost Ramus to Ramus lesion, 90% stenosed. Post intervention, there is a 0% residual stenosis.  Mid LAD lesion, 65% stenosed.  1. Severe single vessel CAD involving the ramus intermedius, successfully treated with PCI following the [employer] Stent protocol. 2. Moderate LAD stenosis with negative FFR analysis 3. Widely patent, dominant left circumflex      History of Present Illness     81 y/o female, followed by Dr. Acie Fredrickson, with h/o prior NSTEMI, treated conservatively at that time given advanced age, h/o stroke and SVT, admitted  for elective LHC in the setting of recurrent Class 3 angina, despite medical management.   Hospital Course     Patient presented to [facility]07[facility]15 to undergo the planned procedure, performed by Dr. Burt Knack. She was found to have severe single vessel CAD involving the ramus intermedius, successfully treated with PCI following the [employer] Stent protocol. There is moderate LAD stenosis with negative FFR analysis and a widely patent dominant LCx. She tolerated the procedure well and left the cath lab in stable condition. She was placed on DAPT with ASA + Plavix. Plan is to treat with ASA for 30 days, then stop ASA and just continue Plavix alone. Low dose Lipitor, 10 mg daily, was also added. Her BB, Bystolic, was resumed. She was monitored post cath and had no recurrent CP. There were no post cath complications. Renal function, cath site and vital signs remained stable. She ambulated with cardiac rehab w/o difficulty. She was last seen and examined by Dr. Angelena Form, who determined she was stable for discharge home. She will f/u with Dr. Acie Fredrickson in 1-2 weeks.    Consultants: None   Discharge Vitals Blood pressure 139/53, pulse 72, temperature 97.7 F (36.5 C), temperature source Oral, resp. rate 17, height 5' 2.5" (1.588 m), weight 171 lb 4.8 oz (77.7 kg), SpO2 95 %.  Filed Weights   08/07/15 0954 08/08/15 0609  Weight: 170 lb (77.111 kg) 171 lb 4.8 oz (77.7 kg)    Labs & Radiologic Studies    CBC  Recent Labs  08/07/15 1030  08/08/15 0404  WBC 4.3 5.5  HGB 14.7 13.5  HCT 45.0 41.9  MCV 95.7 95.9  PLT 160 [id number]   Basic Metabolic Panel  Recent Labs  08/07/15 1030 08/08/15 0404  NA 141 139  K 4.0 4.0  CL 101 104  CO2 28 27  GLUCOSE 114* 106*  BUN 14 12  CREATININE 0.75 0.67  CALCIUM 9.6 8.6*   Liver Function Tests No results for input(s): AST, ALT, ALKPHOS, BILITOT, PROT, ALBUMIN in the last 72 hours. No results for input(s): LIPASE, AMYLASE in the last 72 hours. Cardiac  Enzymes  Recent Labs  08/07/15 1300 08/08/15 0404  TROPONINI <0.03 0.35*   BNP Invalid input(s): POCBNP D-Dimer No results for input(s): DDIMER in the last 72 hours. Hemoglobin A1C No results for input(s): HGBA1C in the last 72 hours. Fasting Lipid Panel No results for input(s): CHOL, HDL, LDLCALC, TRIG, CHOLHDL, LDLDIRECT in the last 72 hours. Thyroid Function Tests No results for input(s): TSH, T4TOTAL, T3FREE, THYROIDAB in the last 72 hours.  Invalid input(s): FREET3 _____________  No results found. Disposition   Pt is being discharged home today in good condition.  Follow-up Plans & Appointments    Follow-up Information    Follow up with Charlie Pitter, PA-C On 08/20/2015.   Specialties:  Cardiology, Radiology   Why:  11:00 AM (Dr. Elmarie Shiley PA)   Contact information:   [address] [place] [address] [place] [address] [phone]      Discharge Instructions    Diet - low sodium heart healthy    Complete by:  As directed      Increase activity slowly    Complete by:  As directed            Discharge Medications   Current Discharge Medication List    START taking these medications   Details  atorvastatin (LIPITOR) 10 MG tablet Take 1 tablet (10 mg total) by mouth daily. Qty: 30 tablet, Refills: 5      CONTINUE these medications which have CHANGED   Details  nitroGLYCERIN (NITROSTAT) 0.4 MG SL tablet Place 1 tablet (0.4 mg total) under the tongue every 5 (five) minutes as needed for chest pain. Qty: 25 tablet, Refills: 2      CONTINUE these medications which have NOT CHANGED   Details  aspirin EC 81 MG EC tablet Take 1 tablet (81 mg total) by mouth daily.    cholecalciferol (VITAMIN D) 1000 UNITS tablet Take 1,000 Units by mouth daily.    clopidogrel (PLAVIX) 75 MG tablet Take 1 tablet (75 mg total) by mouth daily. Qty: 90 tablet, Refills: 3    fluorouracil (EFUDEX) 5 % cream Apply 1 application topically daily. For 6 weeks Refills: 0     Melatonin 1 MG SUBL Place 1 mg under the tongue at bedtime.    Multiple Vitamin (MULTI VITAMIN DAILY PO) Take 1 tablet by mouth every morning.    nebivolol (BYSTOLIC) 2.5 MG tablet Take 2.5 mg by mouth every evening.    Polyethyl Glycol-Propyl Glycol (SYSTANE ULTRA) 0.4-0.3 % SOLN Apply to eye.    prednisoLONE acetate (PRED FORTE) 1 % ophthalmic suspension Place 1 drop into both eyes 2 (two) times daily.     ranitidine (ZANTAC) 150 MG tablet Take 1 tablet (150 mg total) by mouth at bedtime. Qty: 30 tablet, Refills: 6    amLODipine (NORVASC) 2.5 MG tablet Take 2.5 mg by mouth daily as needed (BLOOD PRESSURE). Only take if greater than 140  hyoscyamine (LEVSIN, ANASPAZ) 0.125 MG tablet Take 0.125 mg by mouth every 4 (four) hours as needed for bladder spasms or cramping.    metroNIDAZOLE (FLAGYL) 500 MG tablet Take one po TID x 10 days Qty: 30 tablet, Refills: 0         Aspirin prescribed at discharge?  Yes High Intensity Statin Prescribed? (Lipitor 40-80mg  or Crestor 20-40mg ): No: low dose Beta Blocker Prescribed? Yes For EF <40%, was ACEI/ARB Prescribed? No:  ADP Receptor Inhibitor Prescribed? (i.e. Plavix etc.-Includes Medically Managed Patients): Yes For EF <40%, Aldosterone Inhibitor Prescribed? No: Was EF assessed during THIS hospitalization? Yes Was Cardiac Rehab II ordered? (Included Medically managed Patients): No:    Outstanding Labs/Studies   FLP + HFTs in 12 weeks  Duration of Discharge Encounter   Greater than 30 minutes including physician time.  Signed, Lyda Jester PA-C 08/08/2015, 8:44 AM

## 2015-08-08 NOTE — Care Management Note (Signed)
Case Management Note  Patient Details  Name: [name] [name] MRN: [id number] Date of Birth: May 12, 1924  Subjective/Objective:  Patient is from home alone, on plavix, ambulated with Cardiac rehab.  No needs.                  Action/Plan:   Expected Discharge Date:                  Expected Discharge Plan:  Home/Self Care  In-House Referral:     Discharge planning Services  CM Consult  Post Acute Care Choice:    Choice offered to:     DME Arranged:    DME Agency:     HH Arranged:    [place] Agency:     Status of Service:  Completed, signed off  Medicare Important Message Given:    Date Medicare IM Given:    Medicare IM give by:    Date Additional Medicare IM Given:    Additional Medicare Important Message give by:     If discussed at [place] of Stay Meetings, dates discussed:    Additional Comments:  Zenon Mayo, RN 08/08/2015, 9:08 AM

## 2015-08-08 NOTE — Progress Notes (Signed)
     SUBJECTIVE: No events overnight. Very mild chest pain last night. Resolved this am.   Tele: sinus  BP 139/53 mmHg  Pulse 72  Temp(Src) 97.7 F (36.5 C) (Oral)  Resp 17  Ht 5' 2.5" (1.588 m)  Wt 171 lb 4.8 oz (77.7 kg)  BMI 30.81 kg/m2  SpO2 95% No intake or output data in the 24 hours ending 08/08/15 0807  PHYSICAL EXAM General: Well developed, well nourished, in no acute distress. Alert and oriented x 3.  Psych:  Good affect, responds appropriately Neck: No JVD. No masses noted.  Lungs: Clear bilaterally with no wheezes or rhonci noted.  Heart: RRR with no murmurs noted. Abdomen: Bowel sounds are present. Soft, non-tender.  Extremities: No lower extremity edema.   LABS: Basic Metabolic Panel:  Recent Labs  08/07/15 1030 08/08/15 0404  NA 141 139  K 4.0 4.0  CL 101 104  CO2 28 27  GLUCOSE 114* 106*  BUN 14 12  CREATININE 0.75 0.67  CALCIUM 9.6 8.6*   CBC:  Recent Labs  08/07/15 1030 08/08/15 0404  WBC 4.3 5.5  HGB 14.7 13.5  HCT 45.0 41.9  MCV 95.7 95.9  PLT 160 157   Current Meds: . aspirin EC  81 mg Oral Daily  . clopidogrel  75 mg Oral Daily  . famotidine  20 mg Oral Daily  . Melatonin  3 mg Oral QHS  . multivitamin with minerals   Oral q morning - 10a  . nebivolol  2.5 mg Oral QPM  . prednisoLONE acetate  1 drop Both Eyes BID  . sodium chloride flush  3 mL Intravenous Q12H     ASSESSMENT AND PLAN:  1. CAD/Unstable angina:  Pt with recent unstable angina. Cardiac cath 08/08/15 per Dr. Burt Knack. Severe stenosis ostial Ramus intermediate branch treated with drug eluting stent (study stent). Moderate LAD stenosis with negative FFR. Stable this am. Post cath labs ok. Plan for DAPT for one month with ASA and Plavix then stop ASA and just continue Plavix. Continue beta blocker. Will start low dose statin (lipitor 10 mg daily). Will need lipids and LFTS in 12 weeks.   D/C home today. Follow up Dr. Acie Fredrickson in 1-2 weeks.   Lauree Chandler  5/18/20178:07 AM

## 2015-08-18 ENCOUNTER — Encounter: Payer: Self-pay | Admitting: Physician Assistant

## 2015-08-18 LAB — CUP PACEART REMOTE DEVICE CHECK: Date Time Interrogation Session: 20170409010953

## 2015-08-18 NOTE — Progress Notes (Signed)
Cardiology Office Note    Date:  08/20/2015  ID:  [name] [name], DOB [birth year], MRN [id number] PCP:  Marjorie Smolder, MD  Cardiologist:  Burt Knack   Chief Complaint: f/u stenting  History of Present Illness:  [name] [name] is a 82 y.o. female with history of HTN, SVT, H pylori, CAD (NSTEMI 2015 tx conservatively, angina 07/2015 s/p Bio Freedom Stent to ramus intermedius with mod LAD stenosis), stroke who presents for post-hospital follow-up. She was recently admitted for [facility] in the setting of recurrent Class 3 angina, despite medical management. She was found to have severe single vessel CAD involving the ramus intermedius, successfully treated with PCI following the Bio Freedom Stent protocol. There is moderate LAD stenosis with negative FFR analysis and a widely patent dominant LCx. The plan is to treat with ASA + Plavix for 30 days then discontinue aspirin and continue Plavix alone. Of note she is remotely followed by EP as she has a loop recorder that has shown PSVT- Amiodarone was recommended but she declined due to side effect profile. She also has h/o prior 1-39% carotid stenosis in 12/2013.   She comes in for follow-up overall feeling better. No recurrent CP or dyspnea. She thought she had an episode of palpitations several nights ago - EP interrogation 5/28 showed no new episodes but she's not sure what time the episode was. She recently complained to her daughter about pain in her shoulder while brushing her hair and some back discomfort which started after she began the atorvastatin. No bleeding, syncope, LEE.   Past Medical History  Diagnosis Date  . Blind left eye     a. Corneal transplant x3 with rejection  . Varicose veins   . SVT (supraventricular tachycardia) (HCC)     a. seen by Dr. Caryl Comes - has loop recorder in. Patient has declined amiodarone due to side effect profile  . Helicobacter pylori (H. pylori) 05/22/02    RUT-Positive  . Gastritis   . Diverticulosis   .  Diverticulitis large intestine   . CAD (coronary artery disease)     a. s/p NSTEMI 2015 treated conservatively; nuclear stress test low risk. b. Continued angina despite med rx 07/2015 - s/p io Freedom Stent to ramus intermedius with mod LAD stenosis neg by FFR.  . Pre-diabetes   . Basal cell carcinoma     "several; scattered over my face, hands, leg some cut off; some burned off"  . Squamous carcinoma (HCC)     "several; scattered over my face, hands, leg some cut off; some burned off"  . Complication of anesthesia     "very sensitive to RX"  . Hypertension   . Myocardial infarction ([place]) 2015  . Stroke [facility]) 2015    denies residual on 08/07/2015  . Hayfever   . GERD (gastroesophageal reflux disease)   . Arthritis     "thumbs, joints" (08/07/2015)  . Chronic right hip pain     Past Surgical History  Procedure Laterality Date  . Corneal transplant  right 2009, left 2009    both eyes, 3 in left eye, 1 in right eye  . Cataract extraction w/ intraocular lens  implant, bilateral Bilateral 2005  . Total abdominal hysterectomy  11/1983    "ovaries and all"  . Total knee arthroplasty Left 06/27/2012    Procedure: LEFT TOTAL KNEE ARTHROPLASTY;  Surgeon: Gearlean Alf, MD;  Location: [facility];  Service: Orthopedics;  Laterality: Left;  . Total knee arthroplasty Right 12/12/2012    Procedure:  RIGHT TOTAL KNEE ARTHROPLASTY;  Surgeon: Gearlean Alf, MD;  Location: [facility];  Service: Orthopedics;  Laterality: Right;  . Loop recorder implant N/A 01/05/2014    Procedure: LOOP RECORDER IMPLANT;  Surgeon: Coralyn Mark, MD;  Location: [place] CATH LAB;  Service: Cardiovascular;  Laterality: N/A;  . Eye surgery    . Joint replacement    . Tonsillectomy    . Laparoscopic cholecystectomy  1993    "had gangrene in it"  . Closed reduction shoulder dislocation Left 2016 X 2  . Basal cell carcinoma excision    . Squamous cell carcinoma excision    . Tubal ligation  1966  . Coronary angioplasty with stent  placement  08/07/2015    "1 stent"  . Cardiac catheterization N/A 08/07/2015    Procedure: Left Heart Cath and Coronary Angiography;  Surgeon: [name], MD;  Location: [place] CV LAB;  Service: Cardiovascular;  Laterality: N/A;  . Cardiac catheterization N/A 08/07/2015    Procedure: Coronary Stent Intervention;  Surgeon: [name], MD;  Location: [place] CV LAB;  Service: Cardiovascular;  Laterality: N/A;  . Cardiac catheterization N/A 08/07/2015    Procedure: Intravascular Pressure Wire/FFR Study;  Surgeon: [name], MD;  Location: [place] CV LAB;  Service: Cardiovascular;  Laterality: N/A;    Current Medications: Outpatient Prescriptions Prior to Visit  Medication Sig Dispense Refill  . amLODipine (NORVASC) 2.5 MG tablet Take 2.5 mg by mouth daily as needed (BLOOD PRESSURE). Only take if greater than 140    . aspirin EC 81 MG EC tablet Take 1 tablet (81 mg total) by mouth daily.    Marland Kitchen atorvastatin (LIPITOR) 10 MG tablet Take 1 tablet (10 mg total) by mouth daily. 30 tablet 5  . cholecalciferol (VITAMIN D) 1000 UNITS tablet Take 1,000 Units by mouth daily.    . clopidogrel (PLAVIX) 75 MG tablet Take 1 tablet (75 mg total) by mouth daily. 90 tablet 3  . hyoscyamine (LEVSIN, ANASPAZ) 0.125 MG tablet Take 0.125 mg by mouth every 4 (four) hours as needed for bladder spasms or cramping.    . Melatonin 1 MG SUBL Place 1 mg under the tongue at bedtime.    . Multiple Vitamin (MULTI VITAMIN DAILY PO) Take 1 tablet by mouth every morning.    . nebivolol (BYSTOLIC) 2.5 MG tablet Take 2.5 mg by mouth every evening.    . nitroGLYCERIN (NITROSTAT) 0.4 MG SL tablet Place 1 tablet (0.4 mg total) under the tongue every 5 (five) minutes as needed for chest pain. 25 tablet 2  . Polyethyl Glycol-Propyl Glycol (SYSTANE ULTRA) 0.4-0.3 % SOLN Apply 1 drop to eye 2 (two) times daily.     . prednisoLONE acetate (PRED FORTE) 1 % ophthalmic suspension Place 1 drop into both eyes 2 (two) times  daily.     . ranitidine (ZANTAC) 150 MG tablet Take 1 tablet (150 mg total) by mouth at bedtime. 30 tablet 6  . fluorouracil (EFUDEX) 5 % cream Apply 1 application topically daily. For 6 weeks  0  . metroNIDAZOLE (FLAGYL) 500 MG tablet Take one po TID x 10 days 30 tablet 0   No facility-administered medications prior to visit.     Allergies:   Other; Ultram; Buprenex; Keflex; Levofloxacin; Augmentin; Hydralazine hcl; Levofloxacin; Percocet; Sulfa antibiotics; Tape; and Zetia   Social History   Social History  . Marital Status: Widowed    Spouse Name: N/A  . Number of Children: 3  . Years of Education: N/A  Occupational History  . retired    Social History Main Topics  . Smoking status: Never Smoker   . Smokeless tobacco: Never Used  . Alcohol Use: No  . Drug Use: No  . Sexual Activity: No   Other Topics Concern  . None   Social History Narrative   Patient is right handed   Patient lives alone.   Patient drinks 1 cup of coffee daily.     Family History:  The patient's family history includes Cancer in her brother; Heart attack in her brother, brother, brother, brother, and father; Parkinsonism in her brother; Stroke in her mother.   ROS:   Please see the history of present illness.  All other systems are reviewed and otherwise negative.    PHYSICAL EXAM:   VS:  BP 150/80 mmHg  Pulse 70  Ht 5' 2.5" (1.588 m)  Wt 169 lb 1.9 oz (76.712 kg)  BMI 30.42 kg/m2  BMI: Body mass index is 30.42 kg/(m^2). GEN: Well nourished, well developed elderly WF, in no acute distress HEENT: normocephalic, atraumatic, left eye blind Neck: no JVD, carotid bruits, or masses Cardiac: RRR; no murmurs, rubs, or gallops, no edema  Respiratory:  clear to auscultation bilaterally, normal work of breathing GI: soft, nontender, nondistended, + BS MS: no deformity or atrophy Skin: warm and dry, no rash. Right radial cath site without hematoma or ecchymosis; good pulse. Left radial cath site  without hematoma or ecchymosis; good pulse. Right groin site has small knot at injection site but does not have the appearance of a true hematoma; no bruit. Mild ecchymosis is in the later stages of resolution. Neuro:  Alert and Oriented x 3, Strength and sensation are intact, follows commands Psych: euthymic mood, full affect  Wt Readings from Last 3 Encounters:  08/20/15 169 lb 1.9 oz (76.712 kg)  08/08/15 171 lb 4.8 oz (77.7 kg)  07/18/15 171 lb 9.6 oz (77.837 kg)      Studies/Labs Reviewed:   EKG:  EKG was ordered today and personally reviewed by me, the EKG ordered today demonstrates NSR 70bpm no ST-T changes  Recent Labs: 12/19/2014: ALT 34 03/05/2015: TSH 2.581 08/08/2015: BUN 12; Creatinine, Ser 0.67; Hemoglobin 13.5; Platelets 157; Potassium 4.0; Sodium 139   Lipid Panel    Component Value Date/Time   CHOL 158 01/05/2014 0346   TRIG 92 01/05/2014 0346   HDL 49 01/05/2014 0346   CHOLHDL 3.2 01/05/2014 0346   VLDL 18 01/05/2014 0346   LDLCALC 91 01/05/2014 0346    Additional studies/ records that were reviewed today include: Summarized above.    ASSESSMENT & PLAN:   1. CAD s/p PCI - doing well. Per research protocol, stop aspirin after 30 days (last dose 09/06/15). Continue Plavix. Continue beta blocker. She has had some myalgias recently - we discussed various options of switching med, lowering dose, going to QOD versus a drug holiday. She and her daughter have decided to try going off it for 2 weeks. She will call us back in 2 weeks to let us know how her myalgias are doing. At that time I would consider re-initiating a lower dose. She has a small knot at her cath site - does not have appearance or sound of a pseudo. I asked her to keep an eye on this and if it does not resolve or she develops worsening symptoms to let us know. 2. Essential HTN - the patient states in the past she's done better with a higher BP due to  fatigue with lower BP under the care of Dr. Burt Knack.  Will not make any changes today since she is feeling better, but I have asked her to monitor this outside the office to determine if we need to re-initiate amlodipine if it is beginning to creep further up. 3. PSVT - d/w EP - she did not have any episodes of SVT on 5/28 to correlate with palpitations. Last episode was 5/15. Continue to monitor. The patient affirms she does not presently wish to begin amiodarone to prevent future episodes due to risk of eye damage since she's already blind in one eye.  Disposition: F/u with Dr. Burt Knack in 11/2015 as scheduled.   Medication Adjustments/Labs and Tests Ordered: Current medicines are reviewed at length with the patient today.  Concerns regarding medicines are outlined above. Medication changes, Labs and Tests ordered today are listed in the Patient Instructions below.   Raechel Ache PA-C  08/20/2015 11:22 AM    [facility] [place], [place], [place]  [address] Phone: [phone]; Fax: [phone]

## 2015-08-18 NOTE — Progress Notes (Signed)
Carelink summary report received. Battery status OK. Normal device function. No new symptom episodes, tachy episodes, brady, or pause episodes. No new AF episodes. Monthly summary reports and ROV/PRN 

## 2015-08-20 ENCOUNTER — Ambulatory Visit (INDEPENDENT_AMBULATORY_CARE_PROVIDER_SITE_OTHER): Payer: PPO | Admitting: Physician Assistant

## 2015-08-20 ENCOUNTER — Telehealth: Payer: Self-pay | Admitting: *Deleted

## 2015-08-20 ENCOUNTER — Encounter: Payer: Self-pay | Admitting: Physician Assistant

## 2015-08-20 VITALS — BP 150/80 | HR 70 | Ht 62.5 in | Wt 169.1 lb

## 2015-08-20 DIAGNOSIS — I471 Supraventricular tachycardia, unspecified: Secondary | ICD-10-CM

## 2015-08-20 DIAGNOSIS — Z9861 Coronary angioplasty status: Secondary | ICD-10-CM

## 2015-08-20 DIAGNOSIS — I10 Essential (primary) hypertension: Secondary | ICD-10-CM

## 2015-08-20 DIAGNOSIS — I2510 Atherosclerotic heart disease of native coronary artery without angina pectoris: Secondary | ICD-10-CM

## 2015-08-20 NOTE — Telephone Encounter (Signed)
S/w pt's daughter per DPR is aware of [name] [name]'s message

## 2015-08-20 NOTE — Patient Instructions (Signed)
Medication Instructions:  Your physician has recommended you make the following change in your medication:  1. Discontinue lipitor, call in two weeks at [phone] to update regarding joint pain 2. Last dose pf Aspirin is September 06, 2015   Labwork: -None  Testing/Procedures: -None   Follow-Up: Your physician recommends that you keep your scheduled  follow-up appointment with Dr. Burt Knack   Any Other Special Instructions Will Be Listed Below (If Applicable). Follow up with blood pressure over next several weeks to make sure continues to stay a little elevated but do not want bp to high.    If you need a refill on your cardiac medications before your next appointment, please call your pharmacy.

## 2015-08-28 ENCOUNTER — Ambulatory Visit (INDEPENDENT_AMBULATORY_CARE_PROVIDER_SITE_OTHER): Payer: PPO | Admitting: *Deleted

## 2015-08-28 DIAGNOSIS — I639 Cerebral infarction, unspecified: Secondary | ICD-10-CM | POA: Diagnosis not present

## 2015-08-28 DIAGNOSIS — H00024 Hordeolum internum left upper eyelid: Secondary | ICD-10-CM | POA: Diagnosis not present

## 2015-08-29 NOTE — Progress Notes (Signed)
Carelink Summary Report / Loop Recorder 

## 2015-09-05 LAB — CUP PACEART REMOTE DEVICE CHECK: Date Time Interrogation Session: 20170509013953

## 2015-09-09 DIAGNOSIS — R21 Rash and other nonspecific skin eruption: Secondary | ICD-10-CM | POA: Diagnosis not present

## 2015-09-11 ENCOUNTER — Encounter: Payer: Self-pay | Admitting: Gastroenterology

## 2015-09-11 ENCOUNTER — Encounter: Payer: Self-pay | Admitting: *Deleted

## 2015-09-11 ENCOUNTER — Other Ambulatory Visit: Payer: Self-pay | Admitting: *Deleted

## 2015-09-11 ENCOUNTER — Ambulatory Visit (INDEPENDENT_AMBULATORY_CARE_PROVIDER_SITE_OTHER): Payer: PPO | Admitting: Gastroenterology

## 2015-09-11 ENCOUNTER — Encounter: Payer: Self-pay | Admitting: Physician Assistant

## 2015-09-11 VITALS — BP 114/60 | HR 80 | Ht 62.5 in | Wt 170.0 lb

## 2015-09-11 DIAGNOSIS — K5732 Diverticulitis of large intestine without perforation or abscess without bleeding: Secondary | ICD-10-CM | POA: Diagnosis not present

## 2015-09-11 DIAGNOSIS — K219 Gastro-esophageal reflux disease without esophagitis: Secondary | ICD-10-CM | POA: Diagnosis not present

## 2015-09-11 DIAGNOSIS — R1032 Left lower quadrant pain: Secondary | ICD-10-CM

## 2015-09-11 DIAGNOSIS — Z006 Encounter for examination for normal comparison and control in clinical research program: Secondary | ICD-10-CM

## 2015-09-11 MED ORDER — HYOSCYAMINE SULFATE 0.125 MG PO TABS: 0.1250 mg | ORAL_TABLET | ORAL | Status: DC | PRN

## 2015-09-11 NOTE — Progress Notes (Signed)
LEADERS [facility] II Research 1 month follow up visit completed. New Informed Consent form signed (IRB dated 12/MAY/2017) copy given to patient. EKG obtained and patient states she has been asymptomatic (no Angina). Copy of the Research required follow up schedule given to family upon request. Questions encouraged and answered.

## 2015-09-11 NOTE — Progress Notes (Signed)
HPI :  82 y/o female with history of CVA, SVT, and MI, with history of diverticulitis, here for a follow up. She has a history of left sided diverticulitis, and admitted in October first week for her most recent occurrence. This was her first episode of diverticulitis requiring hospitalization. She failed outpatient management. Her symptoms included severe LLQ pain. Given IV antibiotics for 3-4 days and discharged. She had a recurrence of one other episode of diverticulitis in April, treated with antibiotics. The antibiotics have resolved her symptoms. She typically gets pain in the LLQ with these episode. She continues to have some mild occasional pains in the LLQ at times prior to having a bowel movement. She reports her pain is reliably relieved with a bowel movement. She has one BM per day. Most stools are soft. No blood in the stools. Weight is stable. She has not taken hyoscyamine much at all.   She otherwise is taking zantac for reflux which she tolerates well.   Of note she had a cardiac cath last month with a coronary stent placed. On aspirin and plavix x 1 month, and then plavix indefinitely. She has been doing okay in this regard without chest pains or shortness of breath.   Last colonoscopy in 06/2002 showing left sided diverticulosis.  Past Medical History  Diagnosis Date  . Blind left eye     a. Corneal transplant x3 with rejection  . Varicose veins   . SVT (supraventricular tachycardia) (HCC)     a. seen by Dr. Caryl Comes - has loop recorder in. Patient has declined amiodarone due to side effect profile  . Helicobacter pylori (H. pylori) 05/22/02    RUT-Positive  . Gastritis   . Diverticulosis   . Diverticulitis large intestine   . CAD (coronary artery disease)     a. s/p NSTEMI 2015 treated conservatively; nuclear stress test low risk. b. Continued angina despite med rx 07/2015 - s/p io Freedom Stent to ramus intermedius with mod LAD stenosis neg by FFR.  . Pre-diabetes   . Basal  cell carcinoma     "several; scattered over my face, hands, leg some cut off; some burned off"  . Squamous carcinoma (HCC)     "several; scattered over my face, hands, leg some cut off; some burned off"  . Complication of anesthesia     "very sensitive to RX"  . Hypertension   . Myocardial infarction ([place]) 2015  . Stroke [facility]) 2015    denies residual on 08/07/2015  . Hayfever   . GERD (gastroesophageal reflux disease)   . Arthritis     "thumbs, joints" (08/07/2015)  . Chronic right hip pain      Past Surgical History  Procedure Laterality Date  . Corneal transplant  right 2009, left 2009    both eyes, 3 in left eye, 1 in right eye  . Cataract extraction w/ intraocular lens  implant, bilateral Bilateral 2005  . Total abdominal hysterectomy  11/1983    "ovaries and all"  . Total knee arthroplasty Left 06/27/2012    Procedure: LEFT TOTAL KNEE ARTHROPLASTY;  Surgeon: Gearlean Alf, MD;  [facility]: [facility];  Service: Orthopedics;  Laterality: Left;  . Total knee arthroplasty Right 12/12/2012    Procedure: RIGHT TOTAL KNEE ARTHROPLASTY;  Surgeon: Gearlean Alf, MD;  Location: [facility];  Service: Orthopedics;  Laterality: Right;  . Loop recorder implant N/A 01/05/2014    Procedure: LOOP RECORDER IMPLANT;  Surgeon: Coralyn Mark, MD;  Location: [facility]  CATH LAB;  Service: Cardiovascular;  Laterality: N/A;  . Eye surgery    . Joint replacement    . Tonsillectomy    . Laparoscopic cholecystectomy  1993    "had gangrene in it"  . Closed reduction shoulder dislocation Left 2016 X 2  . Basal cell carcinoma excision    . Squamous cell carcinoma excision    . Tubal ligation  1966  . Coronary angioplasty with stent placement  08/07/2015    "1 stent"  . Cardiac catheterization N/A 08/07/2015    Procedure: Left Heart Cath and Coronary Angiography;  Surgeon: [name], MD;  Location: [place] CV LAB;  Service: Cardiovascular;  Laterality: N/A;  . Cardiac catheterization N/A 08/07/2015     Procedure: Coronary Stent Intervention;  Surgeon: [name], MD;  Location: [place] CV LAB;  Service: Cardiovascular;  Laterality: N/A;  . Cardiac catheterization N/A 08/07/2015    Procedure: Intravascular Pressure Wire/FFR Study;  Surgeon: [name], MD;  Location: [place] CV LAB;  Service: Cardiovascular;  Laterality: N/A;   Family History  Problem Relation Age of Onset  . Stroke Mother   . Heart attack Father   . Heart attack Brother   . Cancer Brother   . Heart attack Brother   . Heart attack Brother   . Heart attack Brother   . Parkinsonism Brother    Social History  Substance Use Topics  . Smoking status: Never Smoker   . Smokeless tobacco: Never Used  . Alcohol Use: No   Current Outpatient Prescriptions  Medication Sig Dispense Refill  . amLODipine (NORVASC) 2.5 MG tablet Take 2.5 mg by mouth daily as needed (BLOOD PRESSURE). Only take if greater than 140    . cholecalciferol (VITAMIN D) 1000 UNITS tablet Take 1,000 Units by mouth daily.    . clopidogrel (PLAVIX) 75 MG tablet Take 1 tablet (75 mg total) by mouth daily. 90 tablet 3  . hyoscyamine (LEVSIN, ANASPAZ) 0.125 MG tablet Take 0.125 mg by mouth every 4 (four) hours as needed for bladder spasms or cramping.    . Melatonin 1 MG SUBL Place 1 mg under the tongue at bedtime.    . Multiple Vitamin (MULTI VITAMIN DAILY PO) Take 1 tablet by mouth every morning.    . nebivolol (BYSTOLIC) 2.5 MG tablet Take 2.5 mg by mouth every evening.    . nitroGLYCERIN (NITROSTAT) 0.4 MG SL tablet Place 1 tablet (0.4 mg total) under the tongue every 5 (five) minutes as needed for chest pain. 25 tablet 2  . Polyethyl Glycol-Propyl Glycol (SYSTANE ULTRA) 0.4-0.3 % SOLN Apply 1 drop to eye 2 (two) times daily.     . prednisoLONE acetate (PRED FORTE) 1 % ophthalmic suspension Place 1 drop into both eyes 2 (two) times daily.     . ranitidine (ZANTAC) 150 MG tablet Take 1 tablet (150 mg total) by mouth at bedtime. 30 tablet 6    No current facility-administered medications for this visit.   Allergies  Allergen Reactions  . Other Anaphylaxis, Shortness Of Breath and Swelling    Yellow eye dye that is used to check retina. Caused swelling of tongue.  Marland Kitchen Ultram [Tramadol] Hives  . Buprenex [Buprenorphine Hcl] Other (See Comments)    Heart raced  . Keflex [Cephalexin] Diarrhea  . Levofloxacin Other (See Comments)  . Augmentin [Amoxicillin-Pot Clavulanate] Nausea And Vomiting  . Hydralazine Hcl Swelling, Palpitations and Rash    Patient developed facial flushing, swelling in legs.  . Levofloxacin Other (See  Comments)    levaquin unknown reaction  . Percocet [Oxycodone-Acetaminophen] Nausea And Vomiting and Other (See Comments)    "felt drained"  . Sulfa Antibiotics Other (See Comments)    unknown  . Tape Other (See Comments)    Adhesive tape causes skin to pull off  . Zetia [Ezetimibe] Other (See Comments)    unknown     Review of Systems: All systems reviewed and negative except where noted in HPI.   Lab Results  Component Value Date   WBC 5.5 08/08/2015   HGB 13.5 08/08/2015   HCT 41.9 08/08/2015   MCV 95.9 08/08/2015   PLT 157 08/08/2015      Physical Exam: BP 114/60 mmHg  Pulse 80  Ht 5' 2.5" (1.588 m)  Wt 170 lb (77.111 kg)  BMI 30.58 kg/m2 Constitutional: Pleasant,well-developed, female in no acute distress. HEENT: Normocephalic and atraumatic. Marland Kitchen No scleral icterus. Neck supple.  Cardiovascular: Normal rate, regular rhythm.  Pulmonary/chest: Effort normal and breath sounds normal. No wheezing, rales or rhonchi. Abdominal: Soft, nondistended, nontender. Bowel sounds active throughout. There are no masses palpable. No hepatomegaly. Extremities: no edema Lymphadenopathy: No cervical adenopathy noted. Neurological: Alert and oriented to person place and time. Skin: Skin is warm and dry. No rashes noted. Psychiatric: Normal mood and affect. Behavior is normal.   ASSESSMENT AND  PLAN: 82 y/o female here for follow up for the following issues:  Diverticulitis / LLQ pain - colonoscopy in 2004 showed left sided diverticulosis without adenomas / polyps. She has had multiple episodes over the past 2 years including one hospitalization, although only one episode for which she required treatment since her hospitalization. The most recent episode was in April, she responds appropriately to antibiotics. She has no anemia or blood in the stools. She has some mild intermittent LLQ pain relieved with a BM at times, suspect due to spasm or perhaps diverticular associated stenosis. We discussed the role of endoscopy following diverticulitis diagnosis to ensure no polyp or mass lesion in the area that would predispose her to diverticulitis, given her last exam was in 2004. We would not need to perform a colonoscopy, and could evaluate the area with a limited flex sig without sedation, given her comorbidities. We discussed this issue at the last visit and she declined at the time. Since the last visit she has had a new coronary stent in place and we discussed risks / benefits of endoscopy again. She and daughter again wished to avoid this if possible, and would like to discuss risks of holding plavix with her cardiologist first, although we could consider a diagnostic exam while continuing plavix. Otherwise we discussed long term management of this issue. Given her recurrent episodes, she is at high risk for future occurences. Options are using antibiotics as needed or surgical resection to prevent recurrence. Patient and daughter wish to avoid surgery if at all possible. She will contact me with recurrence of symptoms and prefers to use antibiotics as needed. I otherwise recommended a daily fiber supplement. If she has frequent recurrences or requires admission again for this in the future, she may have to more strongly consider a surgical evaluation. She will otherwise take Levsin PRN for occasional  cramps. If she developed rectal bleeding, anemia, or bowel changes otherwise she should contact us. Otherwise f/u in 6 months for reassessment or sooner.   Reflux - seems well controlled with zantac. Patient's daughter asked about alternatives, and I counseled that zantac, if she needs to take something for  her symptoms, is likely the safest option for her. She can take it PRN and follow up PRN for this issue.   Sylvia Cellar, MD [facility] Pager [phone]

## 2015-09-11 NOTE — Patient Instructions (Addendum)
Please follow up with Dr Havery Moros in 6 months.  We have sent the following medications to your pharmacy for you to pick up at your convenience: Levsin  We have given you a handout on fiber supplements.  If you are age 80 or older, your body mass index should be between 23-30. Your Body mass index is 30.58 kg/(m^2). If this is out of the aforementioned range listed, please consider follow up with your Primary Care Provider.  If you are age 95 or younger, your body mass index should be between 19-25. Your Body mass index is 30.58 kg/(m^2). If this is out of the aformentioned range listed, please consider follow up with your Primary Care Provider.

## 2015-09-12 ENCOUNTER — Telehealth: Payer: Self-pay | Admitting: Cardiovascular Disease

## 2015-09-12 NOTE — Telephone Encounter (Signed)
New Message   Pt c/o medication issue:  1. Name of Medication: Bystolic  2. How are you currently taking this medication (dosage and times per day)? 2.5mg  2 every evening  3. Are you having a reaction (difficulty breathing--STAT)? Pt daughter states low bp  4. What is your medication issue? Pt daughter call requesting to speak with RN. Pt daughter states pt had stent put in May and her bp has been low since. Pt daughter wants to know if pt need to change bp med from what she is taking currently. Please call back to discuss

## 2015-09-12 NOTE — Telephone Encounter (Signed)
SPOKE WITH PT'S  DAUGHTER  .PT  HAS  HAD  2  DR'S APPT  THIS  WEEK  AND  BOTH B/P  READINGS  WERE LOW  FOR PT,  READING  AT  125/70 AND  114/60. PER  DAUGHTER  PT  FEELS BAD BUT HAS NOT  BEEN  CHECKING  B/P . PT  DOES BETTER  WHEN B/P  IS  IN THE 140'S/ DAUGHTER AWARE  WILL FORWARD  TO DR  Burt Knack  FOR REVIEW .Adonis Housekeeper

## 2015-09-13 NOTE — Telephone Encounter (Signed)
I spoke with the pt's daughter and the information earlier in note was incorrect.  The pt is only taking Bystolic 2.5mg  once every evening. Dr Burt Knack aware and he advised that the pt change bystolic to 2.5mg  daily as needed for palpitations and continue to use Amlodipine as needed for elevated BP. I advised [name] of this information and chart updated.

## 2015-09-13 NOTE — Telephone Encounter (Signed)
Recommend decrease dose to 2.5 mg every evening, 1 pill rather than 2.

## 2015-09-26 DIAGNOSIS — Z85828 Personal history of other malignant neoplasm of skin: Secondary | ICD-10-CM | POA: Diagnosis not present

## 2015-09-26 DIAGNOSIS — B079 Viral wart, unspecified: Secondary | ICD-10-CM | POA: Diagnosis not present

## 2015-09-26 DIAGNOSIS — L821 Other seborrheic keratosis: Secondary | ICD-10-CM | POA: Diagnosis not present

## 2015-09-27 ENCOUNTER — Ambulatory Visit (INDEPENDENT_AMBULATORY_CARE_PROVIDER_SITE_OTHER): Payer: PPO | Admitting: *Deleted

## 2015-09-27 DIAGNOSIS — I639 Cerebral infarction, unspecified: Secondary | ICD-10-CM | POA: Diagnosis not present

## 2015-09-30 NOTE — Progress Notes (Signed)
Carelink Summary Report / Loop Recorder 

## 2015-10-03 LAB — CUP PACEART REMOTE DEVICE CHECK: Date Time Interrogation Session: 20170608020726

## 2015-10-07 ENCOUNTER — Telehealth: Payer: Self-pay | Admitting: *Deleted

## 2015-10-07 ENCOUNTER — Ambulatory Visit: Payer: PPO | Admitting: Gastroenterology

## 2015-10-07 NOTE — Telephone Encounter (Signed)
Left message for patient to call Research office for 2 month telephone follow up Reid study (Biofreedom stent)

## 2015-10-08 ENCOUNTER — Encounter: Payer: Self-pay | Admitting: *Deleted

## 2015-10-08 DIAGNOSIS — Z006 Encounter for examination for normal comparison and control in clinical research program: Secondary | ICD-10-CM

## 2015-10-08 NOTE — Progress Notes (Signed)
[facility] research 2 month telephone follow up completed. Patient states she has been doing really well denies shob and angina. No changes in he medication other than when her ASA was stopped for the study requirements. Next research appointment will be in late October and research office will call later to schedule.

## 2015-10-23 DIAGNOSIS — H0289 Other specified disorders of eyelid: Secondary | ICD-10-CM | POA: Diagnosis not present

## 2015-10-23 DIAGNOSIS — H1852 Epithelial (juvenile) corneal dystrophy: Secondary | ICD-10-CM | POA: Diagnosis not present

## 2015-10-23 LAB — CUP PACEART REMOTE DEVICE CHECK: Date Time Interrogation Session: 20170708030819

## 2015-10-23 NOTE — Progress Notes (Signed)
Carelink summary report received. Battery status OK. Normal device function. No new symptom episodes, brady, or pause episodes. No new AF episodes. 4 tachy- 1 with ECG, previously addressed. Monthly summary reports and ROV/PRN

## 2015-10-28 ENCOUNTER — Ambulatory Visit (INDEPENDENT_AMBULATORY_CARE_PROVIDER_SITE_OTHER): Payer: PPO | Admitting: *Deleted

## 2015-10-28 DIAGNOSIS — I639 Cerebral infarction, unspecified: Secondary | ICD-10-CM

## 2015-10-28 NOTE — Progress Notes (Signed)
Carelink Summary Report / Loop Recorder 

## 2015-11-20 LAB — CUP PACEART REMOTE DEVICE CHECK: Date Time Interrogation Session: 20170807033906

## 2015-11-20 NOTE — Progress Notes (Signed)
Carelink summary report received. Battery status OK. Normal device function. No new symptom episodes, tachy episodes, brady, or pause episodes. No new AF episodes. 1 tachy- previously addressed.Monthly summary reports and ROV/PRN

## 2015-11-26 ENCOUNTER — Ambulatory Visit (INDEPENDENT_AMBULATORY_CARE_PROVIDER_SITE_OTHER): Payer: PPO | Admitting: *Deleted

## 2015-11-26 DIAGNOSIS — I639 Cerebral infarction, unspecified: Secondary | ICD-10-CM | POA: Diagnosis not present

## 2015-11-27 NOTE — Progress Notes (Signed)
Carelink Summary Report / Loop Recorder 

## 2015-11-28 ENCOUNTER — Telehealth: Payer: Self-pay | Admitting: Cardiovascular Disease

## 2015-11-28 ENCOUNTER — Encounter: Payer: Self-pay | Admitting: Cardiovascular Disease

## 2015-11-28 NOTE — Telephone Encounter (Signed)
Informed patient of Dr. Burt Knack response. Patient verbalized understanding.

## 2015-11-28 NOTE — Telephone Encounter (Signed)
Informed patient that she should not need antibiotics for her teeth cleaning. Patient is concerned about this because she had a heart cath in May with a stent. Informed patient that antibiotics should not be necessary. Patient wanted Dr. Burt Knack recommendation.  Will forward to Dr. Burt Knack for further advisement.

## 2015-11-28 NOTE — Telephone Encounter (Signed)
New message       Pt has a dentist appt tomorrow for a cleaning.  She has had both knees replaced. She want to know if she will need an antibiotic?  I told the pt the office policy---have the dentist office call us, she said her appt is in the morning and they will not have time to call us.  Her dentist is Dr Sarajane Jews at [phone].  Please let pt know if she needs an antibiotic.

## 2015-11-28 NOTE — Telephone Encounter (Signed)
Correct. No ABX indicated.

## 2015-11-29 ENCOUNTER — Telehealth: Payer: Self-pay

## 2015-11-29 NOTE — Telephone Encounter (Signed)
Patient's daughter sent a MyChart message:  Hi Dr Burt Knack,   Mom has started to have heart issues again. She had to take two nitro tablets about 4 am today. She began having the palpitations again last week. I wondered if it had shown up on her loop recorder. Is there anything different we should be doing?   Also, Mom started taking 250 mg of magnesium. Could that be causing the problem? She is taking it because of severe leg cramps.   Thanks so much for your help!!  [name] [name] patient about her daughter's message. Patient stated this happen yesterday morning. Patient stated she had some chest discomfort and her chest was quivering. Patient stated her heart rate was elevated, and she had roaring in her ears. Patient stated she has been having trouble sleeping this week. Patient stated after she took the first Nitro, that her discomfort in her chest went away. Patient stated she took the second Nitro to help with the roaring in her ears. Patient stated she feels okay now, but not great. Will forward to Dr. Burt Knack for advisement.

## 2015-12-16 ENCOUNTER — Ambulatory Visit (INDEPENDENT_AMBULATORY_CARE_PROVIDER_SITE_OTHER): Payer: PPO | Admitting: Cardiovascular Disease

## 2015-12-16 ENCOUNTER — Encounter: Payer: Self-pay | Admitting: Cardiovascular Disease

## 2015-12-16 VITALS — BP 160/72 | HR 82 | Ht 62.5 in | Wt 169.0 lb

## 2015-12-16 DIAGNOSIS — I2510 Atherosclerotic heart disease of native coronary artery without angina pectoris: Secondary | ICD-10-CM | POA: Diagnosis not present

## 2015-12-16 DIAGNOSIS — I471 Supraventricular tachycardia: Secondary | ICD-10-CM

## 2015-12-16 NOTE — Patient Instructions (Signed)
Medication Instructions:  Your physician recommends that you continue on your current medications as directed. Please refer to the Current Medication list given to you today.  Labwork: No new orders.   Testing/Procedures: No new orders.   Follow-Up: Your physician wants you to follow-up in: 4 MONTHS with Dr Cooper.  You will receive a reminder letter in the mail two months in advance. If you don't receive a letter, please call our office to schedule the follow-up appointment.   Any Other Special Instructions Will Be Listed Below (If Applicable).     If you need a refill on your cardiac medications before your next appointment, please call your pharmacy.   

## 2015-12-16 NOTE — Progress Notes (Signed)
Cardiology Office Note Date:  12/16/2015   ID:  [name] [name], DOB 03-Nov-1924, MRN [id number]  PCP:  [name] Smolder, MD  Cardiologist:  [name] Mocha, MD    Chief Complaint  Patient presents with  . Coronary Artery Disease     History of Present Illness: [name] [name] is a 82 y.o. female who presents for Follow-up of hypertension, SVT, and coronary artery disease. Patient had a non-ST elevation infarction in 2015 and she was treated conservatively. She returned with anginal chest pain in May 2017 and underwent stenting of severe stenosis in the ramus intermedius. She was noted to have moderate LAD stenosis with medical therapy recommended. FFR was done and was negative. Left circumflex is widely patent and dominant. LV function has been normal. She has had long-standing symptomatic palpitations. Amiodarone has been recommended but she has declined because of concern about side effects. She has an implantable loop recorder.  The patient is doing okay. She has episodes of weakness, sometimes after she has been out working in the garden. She admits to excessive sweating. She has not had frank syncope or presyncope. She continues to have occasional palpitations, mostly at nighttime. No chest pain recently. No shortness of breath. Tends to feel bad when she hasn't eaten in a few hours.  Past Medical History:  Diagnosis Date  . Arthritis    "thumbs, joints" (08/07/2015)  . Basal cell carcinoma    "several; scattered over my face, hands, leg some cut off; some burned off"  . Blind left eye    a. Corneal transplant x3 with rejection  . CAD (coronary artery disease)    a. s/p NSTEMI 2015 treated conservatively; nuclear stress test low risk. b. Continued angina despite med rx 07/2015 - s/p io Freedom Stent to ramus intermedius with mod LAD stenosis neg by FFR.  Marland Kitchen Chronic right hip pain   . Complication of anesthesia    "very sensitive to RX"  . Diverticulitis large intestine   .  Diverticulosis   . Gastritis   . GERD (gastroesophageal reflux disease)   . Hayfever   . Helicobacter pylori (H. pylori) 05/22/02   RUT-Positive  . Hypertension   . Myocardial infarction ([place]) 2015  . Pre-diabetes   . Squamous carcinoma (HCC)    "several; scattered over my face, hands, leg some cut off; some burned off"  . Stroke [facility]) 2015   denies residual on 08/07/2015  . SVT (supraventricular tachycardia) (HCC)    a. seen by [name] [name] - has loop recorder in. Patient has declined amiodarone due to side effect profile  . Varicose veins     Past Surgical History:  Procedure Laterality Date  . BASAL CELL CARCINOMA EXCISION    . CARDIAC CATHETERIZATION N/A 08/07/2015   Procedure: Left Heart Cath and Coronary Angiography;  Surgeon: [name] Mocha, MD;  Location: [place] [facility];  Service: Cardiovascular;  Laterality: N/A;  . CARDIAC CATHETERIZATION N/A 08/07/2015   Procedure: Coronary Stent Intervention;  Surgeon: [name] Mocha, MD;  Location: [place] [facility];  Service: Cardiovascular;  Laterality: N/A;  . CARDIAC CATHETERIZATION N/A 08/07/2015   Procedure: Intravascular Pressure Wire/FFR Study;  Surgeon: [name] Mocha, MD;  Location: [place] [facility];  Service: Cardiovascular;  Laterality: N/A;  . CATARACT EXTRACTION W/ INTRAOCULAR LENS  IMPLANT, BILATERAL Bilateral 2005  . CLOSED REDUCTION SHOULDER DISLOCATION Left 2016 X 2  . CORNEAL TRANSPLANT  right 2009, left 2009   both eyes, 3 in left eye, 1 in right eye  .  CORONARY ANGIOPLASTY WITH STENT PLACEMENT  08/07/2015   "1 stent"  . EYE SURGERY    . JOINT REPLACEMENT    . [place]   "had gangrene in it"  . LOOP RECORDER IMPLANT N/A 01/05/2014   Procedure: LOOP RECORDER IMPLANT;  Surgeon: [name] Mark, MD;  Location: [place] [facility];  Service: Cardiovascular;  Laterality: N/A;  . SQUAMOUS CELL CARCINOMA EXCISION    . TONSILLECTOMY    . TOTAL ABDOMINAL HYSTERECTOMY  11/1983   "ovaries and all"  . TOTAL  KNEE ARTHROPLASTY Left 06/27/2012   Procedure: LEFT TOTAL KNEE ARTHROPLASTY;  Surgeon: Gearlean Alf, MD;  Location: [facility];  Service: Orthopedics;  Laterality: Left;  . TOTAL KNEE ARTHROPLASTY Right 12/12/2012   Procedure: RIGHT TOTAL KNEE ARTHROPLASTY;  Surgeon: Gearlean Alf, MD;  Location: [facility];  Service: Orthopedics;  Laterality: Right;  . TUBAL LIGATION  1966    Current Outpatient Prescriptions  Medication Sig Dispense Refill  . amLODipine (NORVASC) 2.5 MG tablet Take 2.5 mg by mouth daily as needed (BLOOD PRESSURE). Only take if greater than 140    . clopidogrel (PLAVIX) 75 MG tablet Take 1 tablet (75 mg total) by mouth daily. 90 tablet 3  . hyoscyamine (LEVSIN, ANASPAZ) 0.125 MG tablet Take 1 tablet (0.125 mg total) by mouth every 4 (four) hours as needed for cramping. 30 tablet 4  . Melatonin 1 MG SUBL Place 1 mg under the tongue at bedtime.    . Multiple Vitamin (MULTI VITAMIN DAILY PO) Take 1 tablet by mouth every morning.    . nebivolol (BYSTOLIC) 2.5 MG tablet Take 2.5 mg by mouth daily.     . nitroGLYCERIN (NITROSTAT) 0.4 MG SL tablet Place 1 tablet (0.4 mg total) under the tongue every 5 (five) minutes as needed for chest pain. 25 tablet 2  . Polyethyl Glycol-Propyl Glycol (SYSTANE ULTRA) 0.4-0.3 % SOLN Apply 1 drop to eye 2 (two) times daily.     . prednisoLONE acetate (PRED FORTE) 1 % ophthalmic suspension Place 1 drop into both eyes 2 (two) times daily.     . ranitidine (ZANTAC) 150 MG tablet Take 1 tablet (150 mg total) by mouth at bedtime. 30 tablet 6   No current facility-administered medications for this visit.     Allergies:   Other; Sulfa antibiotics; Ultram [tramadol]; Buprenex [buprenorphine hcl]; Keflex [cephalexin]; Levofloxacin; Yellow dye; Augmentin [amoxicillin-pot clavulanate]; Hydralazine hcl; Levofloxacin; Percocet [oxycodone-acetaminophen]; Tape; and Zetia [ezetimibe]   Social History:  The patient  reports that she has never smoked. She has never used  smokeless tobacco. She reports that she does not drink alcohol or use drugs.   Family History:  The patient's  family history includes Cancer in her brother; Heart attack in her brother, brother, brother, brother, and father; Parkinsonism in her brother; Stroke in her mother.    ROS:  Please see the history of present illness.  Otherwise, review of systems is positive for Decreased appetite, feet swelling, cramping in the hands and feet.  All other systems are reviewed and negative.    PHYSICAL EXAM: VS:  BP 120/60   Pulse 82   Ht 5' 2.5" (1.588 m)   Wt 169 lb (76.7 kg)   BMI 30.42 kg/m  , BMI Body mass index is 30.42 kg/m. GEN: Well nourished, well developed, pleasant elderly woman in no acute distress  HEENT: normal  Neck: no JVD, no masses. No carotid bruits Cardiac: RRR without murmur or gallop  Respiratory:  clear to auscultation bilaterally, normal work of breathing GI: soft, nontender, nondistended, + BS MS: no deformity or atrophy  Ext: no pretibial edema, pedal pulses 2+= bilaterally Skin: warm and dry, no rash Neuro:  Strength and sensation are intact Psych: euthymic mood, full affect  EKG:  EKG is not ordered today.   Recent Labs: 12/19/2014: ALT 34 03/05/2015: TSH 2.581 08/08/2015: BUN 12; Creatinine, Ser 0.67; Hemoglobin 13.5; Platelets 157; Potassium 4.0; Sodium 139   Lipid Panel     Component Value Date/Time   CHOL 158 01/05/2014 0346   TRIG 92 01/05/2014 0346   HDL 49 01/05/2014 0346   CHOLHDL 3.2 01/05/2014 0346   VLDL 18 01/05/2014 0346   LDLCALC 91 01/05/2014 0346      Wt Readings from Last 3 Encounters:  12/16/15 169 lb (76.7 kg)  09/11/15 170 lb (77.1 kg)  08/20/15 169 lb 1.9 oz (76.7 kg)     Cardiac Studies Reviewed: Cath 08-07-2015: Conclusion    Ost Ramus to Ramus lesion, 90% stenosed. Post intervention, there is a 0% residual stenosis.  Mid LAD lesion, 65% stenosed.   1. Severe single vessel CAD involving the ramus  intermedius, successfully treated with PCI following the BioFreedom Stent protocol. 2. Moderate LAD stenosis with negative FFR analysis 3. Widely patent, dominant left circumflex  DAPT x 30 days, then stop ASA. Continue plavix long-term.     ASSESSMENT AND PLAN: 1.  CAD, native vessel: Patient status post PCI with moderate residual CAD. The patient will continue on Plavix alone. She is not having any bleeding problems. No recurrent angina. We reviewed her cardiac catheterization findings today. She understands why we are treating her LAD medically since FFR evaluation was negative.  2. Essential hypertension: BP on my recheck is 160/70. She will continue on bisoprolol at night and prn use of amlodipine. She monitors home BP regularly and feels bad when BP is in the low/normal range.   3. Paroxysmal SVT: will check for any Linq recording transmissions. Has elected not to use amiodarone for atrial tachycardia. Continue beta-blocker at low-dose.   Current medicines are reviewed with the patient today.  The patient does not have concerns regarding medicines.  Labs/ tests ordered today include:  No orders of the defined types were placed in this encounter.   Disposition:   FU 4 months  Signed, [name] Mocha, MD  12/16/2015 1:50 PM    [facility] [place], [place], [place]  [address] Phone: [phone]; Fax: [phone]

## 2015-12-18 DIAGNOSIS — R300 Dysuria: Secondary | ICD-10-CM | POA: Diagnosis not present

## 2015-12-19 DIAGNOSIS — R300 Dysuria: Secondary | ICD-10-CM | POA: Diagnosis not present

## 2015-12-20 ENCOUNTER — Encounter: Payer: Self-pay | Admitting: Internal Medicine

## 2015-12-21 LAB — CUP PACEART REMOTE DEVICE CHECK: MDC IDC SESS DTM: 20170906033821

## 2015-12-21 NOTE — Progress Notes (Signed)
Carelink summary report received. Battery status OK. Normal device function. No new symptom episodes, tachy episodes, brady, or pause episodes. No new AF episodes. Monthly summary reports and ROV/PRN 

## 2015-12-26 ENCOUNTER — Ambulatory Visit (INDEPENDENT_AMBULATORY_CARE_PROVIDER_SITE_OTHER): Payer: PPO | Admitting: *Deleted

## 2015-12-26 DIAGNOSIS — I639 Cerebral infarction, unspecified: Secondary | ICD-10-CM

## 2015-12-27 NOTE — Progress Notes (Signed)
Carelink Summary Report / Loop Recorder 

## 2016-01-04 DIAGNOSIS — H1131 Conjunctival hemorrhage, right eye: Secondary | ICD-10-CM | POA: Diagnosis not present

## 2016-01-08 DIAGNOSIS — S81811A Laceration without foreign body, right lower leg, initial encounter: Secondary | ICD-10-CM | POA: Diagnosis not present

## 2016-01-08 DIAGNOSIS — Z23 Encounter for immunization: Secondary | ICD-10-CM | POA: Diagnosis not present

## 2016-01-12 ENCOUNTER — Other Ambulatory Visit: Payer: Self-pay | Admitting: Physician Assistant

## 2016-01-15 DIAGNOSIS — D2230 Melanocytic nevi of unspecified part of face: Secondary | ICD-10-CM | POA: Diagnosis not present

## 2016-01-15 DIAGNOSIS — L821 Other seborrheic keratosis: Secondary | ICD-10-CM | POA: Diagnosis not present

## 2016-01-15 DIAGNOSIS — L570 Actinic keratosis: Secondary | ICD-10-CM | POA: Diagnosis not present

## 2016-01-15 DIAGNOSIS — C44729 Squamous cell carcinoma of skin of left lower limb, including hip: Secondary | ICD-10-CM | POA: Diagnosis not present

## 2016-01-15 DIAGNOSIS — Z23 Encounter for immunization: Secondary | ICD-10-CM | POA: Diagnosis not present

## 2016-01-20 DIAGNOSIS — Z8669 Personal history of other diseases of the nervous system and sense organs: Secondary | ICD-10-CM | POA: Diagnosis not present

## 2016-01-20 DIAGNOSIS — Z83518 Family history of other specified eye disorder: Secondary | ICD-10-CM | POA: Diagnosis not present

## 2016-01-20 DIAGNOSIS — H35371 Puckering of macula, right eye: Secondary | ICD-10-CM | POA: Diagnosis not present

## 2016-01-20 DIAGNOSIS — H44522 Atrophy of globe, left eye: Secondary | ICD-10-CM | POA: Diagnosis not present

## 2016-01-20 DIAGNOSIS — H0289 Other specified disorders of eyelid: Secondary | ICD-10-CM | POA: Diagnosis not present

## 2016-01-27 ENCOUNTER — Ambulatory Visit (INDEPENDENT_AMBULATORY_CARE_PROVIDER_SITE_OTHER): Payer: PPO | Admitting: *Deleted

## 2016-01-27 DIAGNOSIS — I639 Cerebral infarction, unspecified: Secondary | ICD-10-CM

## 2016-01-27 DIAGNOSIS — L03115 Cellulitis of right lower limb: Secondary | ICD-10-CM | POA: Diagnosis not present

## 2016-01-27 DIAGNOSIS — R221 Localized swelling, mass and lump, neck: Secondary | ICD-10-CM | POA: Diagnosis not present

## 2016-01-27 NOTE — Progress Notes (Signed)
Carelink Summary Report / Loop Recorder 

## 2016-01-29 ENCOUNTER — Telehealth: Payer: Self-pay | Admitting: Cardiovascular Disease

## 2016-01-29 ENCOUNTER — Other Ambulatory Visit (HOSPITAL_COMMUNITY): Payer: Self-pay | Admitting: Family Medicine

## 2016-01-29 ENCOUNTER — Ambulatory Visit (HOSPITAL_COMMUNITY)
Admission: RE | Admit: 2016-01-29 | Discharge: 2016-01-29 | Disposition: A | Discharge status: Home / Self Care | Payer: PPO | Source: Ambulatory Visit | Attending: Cardiovascular Disease | Admitting: Cardiovascular Disease

## 2016-01-29 DIAGNOSIS — R221 Localized swelling, mass and lump, neck: Secondary | ICD-10-CM | POA: Insufficient documentation

## 2016-01-29 DIAGNOSIS — I6523 Occlusion and stenosis of bilateral carotid arteries: Secondary | ICD-10-CM

## 2016-01-29 NOTE — Telephone Encounter (Signed)
New Message  Pt daughter call requesting to speak with RN. Pt daughter states that pt has a bulging area on her neck and pt PCP Dr. Inda Merlin would like to do a Carotid on pt today 11/8. Pt daughter would like to speak with RN about pt getting this procedure completed today. Please call back to discuss

## 2016-01-29 NOTE — Telephone Encounter (Signed)
Pt's daughter Ulice Dash called to let Dr Burt Knack know that pt has a bulging area on her neck. Pt's PCP Dr. Inda Merlin have order an carotid droplet with is scheduled for today at the [facility] office. Pt and daughter would like for Dr. Burt Knack to read the results and make recommendations.

## 2016-01-30 NOTE — Telephone Encounter (Signed)
I spoke with pt's daughter and made her aware of 01/29/16 carotid duplex results.  Heterogeneous plaque, bilaterally. 1-39% bilateral ICA stenosis. Normal subclavian arteries, bilaterally. Patent vertebral arteries with antegrade flow. F/u PRN  I advised that the pt should follow-up with PCP for continued evaluation of swelling in neck.

## 2016-01-30 NOTE — Telephone Encounter (Signed)
Left message on machine for pt's daughter [name] to contact the office.

## 2016-02-02 LAB — CUP PACEART REMOTE DEVICE CHECK
MDC IDC PG IMPLANT DT: 20151016
MDC IDC SESS DTM: 20171006033959

## 2016-02-02 NOTE — Progress Notes (Signed)
Carelink summary report received. Battery status OK. Normal device function. No new symptom episodes, brady, or pause episodes. No new AF episodes. 1 tachy episode, short run AT. Monthly summary reports and ROV/PRN

## 2016-02-05 ENCOUNTER — Encounter: Payer: Self-pay | Admitting: Cardiovascular Disease

## 2016-02-05 ENCOUNTER — Encounter: Payer: Self-pay | Admitting: *Deleted

## 2016-02-05 DIAGNOSIS — Z006 Encounter for examination for normal comparison and control in clinical research program: Secondary | ICD-10-CM

## 2016-02-05 NOTE — Progress Notes (Signed)
[facility] II month 6 follow up visit completed. EKG obtained. Patient states she has not had any changes in her medication. When I question her about her Angina she states she had to use ntg. One time but it took 2 ntg sl pills to relieve her Chest pain. Re~inforced her to keep ntg close and do not hesitate to call 911. I also encouraged her to reschedule her cardiology appointment if her Angina became more frequent or concerning to her or her daughter. She verbalized understanding.

## 2016-02-07 ENCOUNTER — Encounter (HOSPITAL_COMMUNITY): Payer: PPO

## 2016-02-10 ENCOUNTER — Other Ambulatory Visit: Payer: Self-pay | Admitting: Gastroenterology

## 2016-02-11 NOTE — Telephone Encounter (Signed)
That's fine, we can refill it. Thanks. You can change it to #90 tabs, RF 3

## 2016-02-11 NOTE — Telephone Encounter (Signed)
Pt requests refills on her Zantac 150 mg at bedtime. Last seen 08-2015. Please advise

## 2016-02-25 ENCOUNTER — Ambulatory Visit (INDEPENDENT_AMBULATORY_CARE_PROVIDER_SITE_OTHER): Payer: PPO | Admitting: *Deleted

## 2016-02-25 DIAGNOSIS — I639 Cerebral infarction, unspecified: Secondary | ICD-10-CM

## 2016-02-25 NOTE — Progress Notes (Signed)
Carelink Summary Report / Loop Recorder 

## 2016-03-13 LAB — CUP PACEART REMOTE DEVICE CHECK
Implantable Pulse Generator Implant Date: 20151016
MDC IDC SESS DTM: 20171105060308

## 2016-03-13 NOTE — Progress Notes (Signed)
Carelink summary report received. Battery status OK. Normal device function. No new symptom episodes, tachy episodes, brady, or pause episodes. No new AF episodes. Monthly summary reports and ROV/PRN 

## 2016-03-19 ENCOUNTER — Encounter: Payer: Self-pay | Admitting: Cardiovascular Disease

## 2016-03-26 ENCOUNTER — Ambulatory Visit (INDEPENDENT_AMBULATORY_CARE_PROVIDER_SITE_OTHER): Payer: PPO | Admitting: *Deleted

## 2016-03-26 DIAGNOSIS — I639 Cerebral infarction, unspecified: Secondary | ICD-10-CM | POA: Diagnosis not present

## 2016-03-26 NOTE — Progress Notes (Signed)
Carelink Summary Report / Loop Recorder 

## 2016-04-12 LAB — CUP PACEART REMOTE DEVICE CHECK
Implantable Pulse Generator Implant Date: 20151016
MDC IDC SESS DTM: 20171205064425

## 2016-04-12 NOTE — Progress Notes (Signed)
Carelink summary report received. Battery status OK. Normal device function. No new symptom episodes, tachy episodes, brady, or pause episodes. No new AF episodes. Monthly summary reports and ROV/PRN 

## 2016-04-13 ENCOUNTER — Other Ambulatory Visit: Payer: Self-pay | Admitting: Internal Medicine

## 2016-04-27 ENCOUNTER — Ambulatory Visit (INDEPENDENT_AMBULATORY_CARE_PROVIDER_SITE_OTHER): Payer: PPO | Admitting: *Deleted

## 2016-04-27 DIAGNOSIS — I639 Cerebral infarction, unspecified: Secondary | ICD-10-CM | POA: Diagnosis not present

## 2016-04-27 NOTE — Progress Notes (Signed)
Carelink Summary Report / Loop Recorder 

## 2016-04-29 ENCOUNTER — Encounter: Payer: Self-pay | Admitting: Cardiovascular Disease

## 2016-04-29 ENCOUNTER — Ambulatory Visit (INDEPENDENT_AMBULATORY_CARE_PROVIDER_SITE_OTHER): Payer: PPO | Admitting: Cardiovascular Disease

## 2016-04-29 VITALS — BP 142/78 | HR 70 | Ht 64.5 in | Wt 169.8 lb

## 2016-04-29 DIAGNOSIS — I471 Supraventricular tachycardia: Secondary | ICD-10-CM

## 2016-04-29 DIAGNOSIS — I10 Essential (primary) hypertension: Secondary | ICD-10-CM

## 2016-04-29 MED ORDER — NEBIVOLOL HCL 5 MG PO TABS: ORAL_TABLET | ORAL | 6 refills | Status: DC

## 2016-04-29 NOTE — Patient Instructions (Signed)
Medication Instructions:  Your physician has recommended you make the following change in your medication:  1. INCREASE Bystolic to 5mg  take one tablet by mouth daily If your systolic BP (top number) is greater than 180 you can take an additional 5mg  daily as needed  Labwork: No new orders.   Testing/Procedures: No new orders.   Follow-Up: Your physician recommends that you schedule a follow-up appointment in: 4 MONTHS with Dr Burt Knack   Any Other Special Instructions Will Be Listed Below (If Applicable).     If you need a refill on your cardiac medications before your next appointment, please call your pharmacy.

## 2016-04-29 NOTE — Progress Notes (Signed)
Cardiology Office Note Date:  04/29/2016   ID:  Tanielle Roeske, DOB Mar 25, 1924, MRN [id number]  PCP:  Marjorie Smolder, MD  Cardiologist:  [name], MD    Chief Complaint  Patient presents with  . Coronary Artery Disease     History of Present Illness: [name] [name] is a 83 y.o. female who presents for follow-up of hypertension, SVT, and coronary artery disease. Patient had a non-ST elevation infarction in 2015 and she was treated conservatively. She returned with anginal chest pain in May 2017 and underwent stenting of severe stenosis in the ramus intermedius. She was noted to have moderate LAD stenosis with medical therapy recommended. FFR was done and was negative. Left circumflex is widely patent and dominant. LV function has been normal. She has had long-standing symptomatic palpitations. Amiodarone has been recommended but she has declined because of concern about side effects. She has an implantable loop recorder.  She is here with her daughter today. Called in with feeling 'hard' heartbeats and reports tachypalpitations as well. No clear chest pain, but hasn't felt well lately. She has episodes of profound fatigue and weakness. Hasn't wanted to go the pool to do her exercises.    Past Medical History:  Diagnosis Date  . Arthritis    "thumbs, joints" (08/07/2015)  . Basal cell carcinoma    "several; scattered over my face, hands, leg some cut off; some burned off"  . Blind left eye    a. Corneal transplant x3 with rejection  . CAD (coronary artery disease)    a. s/p NSTEMI 2015 treated conservatively; nuclear stress test low risk. b. Continued angina despite med rx 07/2015 - s/p io Freedom Stent to ramus intermedius with mod LAD stenosis neg by FFR.  Marland Kitchen Chronic right hip pain   . Complication of anesthesia    "very sensitive to RX"  . Diverticulitis large intestine   . Diverticulosis   . Gastritis   . GERD (gastroesophageal reflux disease)   . Hayfever   . Helicobacter  pylori (H. pylori) 05/22/02   RUT-Positive  . Hypertension   . Myocardial infarction 2015  . Pre-diabetes   . Squamous carcinoma    "several; scattered over my face, hands, leg some cut off; some burned off"  . Stroke [facility]) 2015   denies residual on 08/07/2015  . SVT (supraventricular tachycardia) (HCC)    a. seen by Dr. Caryl Comes - has loop recorder in. Patient has declined amiodarone due to side effect profile  . Varicose veins     Past Surgical History:  Procedure Laterality Date  . BASAL CELL CARCINOMA EXCISION    . CARDIAC CATHETERIZATION N/A 08/07/2015   Procedure: Left Heart Cath and Coronary Angiography;  Surgeon: [name], MD;  Location: [place] CV [facility];  Service: Cardiovascular;  Laterality: N/A;  . CARDIAC CATHETERIZATION N/A 08/07/2015   Procedure: Coronary Stent Intervention;  Surgeon: [name], MD;  Location: [place] [facility];  Service: Cardiovascular;  Laterality: N/A;  . CARDIAC CATHETERIZATION N/A 08/07/2015   Procedure: Intravascular Pressure Wire/FFR Study;  Surgeon: [name], MD;  Location: [place] CV [facility];  Service: Cardiovascular;  Laterality: N/A;  . CATARACT EXTRACTION W/ INTRAOCULAR LENS  IMPLANT, BILATERAL Bilateral 2005  . CLOSED REDUCTION SHOULDER DISLOCATION Left 2016 X 2  . CORNEAL TRANSPLANT  right 2009, left 2009   both eyes, 3 in left eye, 1 in right eye  . CORONARY ANGIOPLASTY WITH STENT PLACEMENT  08/07/2015   "1 stent"  . EYE SURGERY    .  JOINT REPLACEMENT    . [place]   "had gangrene in it"  . LOOP RECORDER IMPLANT N/A 01/05/2014   Procedure: LOOP RECORDER IMPLANT;  Surgeon: Coralyn Mark, MD;  Location: [place] CATH LAB;  Service: Cardiovascular;  Laterality: N/A;  . SQUAMOUS CELL CARCINOMA EXCISION    . TONSILLECTOMY    . TOTAL ABDOMINAL HYSTERECTOMY  11/1983   "ovaries and all"  . TOTAL KNEE ARTHROPLASTY Left 06/27/2012   Procedure: LEFT TOTAL KNEE ARTHROPLASTY;  Surgeon: Gearlean Alf, MD;  Location:  [facility];  Service: Orthopedics;  Laterality: Left;  . TOTAL KNEE ARTHROPLASTY Right 12/12/2012   Procedure: RIGHT TOTAL KNEE ARTHROPLASTY;  Surgeon: Gearlean Alf, MD;  Location: [facility];  Service: Orthopedics;  Laterality: Right;  . TUBAL LIGATION  1966    Current Outpatient Prescriptions  Medication Sig Dispense Refill  . amLODipine (NORVASC) 2.5 MG tablet Take 2.5 mg by mouth daily as needed (BLOOD PRESSURE). Only take if greater than 140    . clopidogrel (PLAVIX) 75 MG tablet Take 1 tablet (75 mg total) by mouth daily. 90 tablet 3  . hyoscyamine (LEVSIN, ANASPAZ) 0.125 MG tablet Take 1 tablet (0.125 mg total) by mouth every 4 (four) hours as needed for cramping. 30 tablet 4  . Melatonin 1 MG SUBL Place 1 mg under the tongue at bedtime.    . Multiple Vitamin (MULTI VITAMIN DAILY PO) Take 1 tablet by mouth every morning.    . nebivolol (BYSTOLIC) 5 MG tablet Take one tablet by mouth daily.  If SBP is greater than 180 you can take an additional 5mg  daily as needed 60 tablet 6  . nitroGLYCERIN (NITROSTAT) 0.4 MG SL tablet Place 1 tablet (0.4 mg total) under the tongue every 5 (five) minutes as needed for chest pain. 25 tablet 2  . Polyethyl Glycol-Propyl Glycol (SYSTANE ULTRA) 0.4-0.3 % SOLN Apply 1 drop to eye 2 (two) times daily.     . prednisoLONE acetate (PRED FORTE) 1 % ophthalmic suspension Place 1 drop into both eyes 2 (two) times daily.     . ranitidine (ZANTAC) 150 MG tablet take 1 tablet by mouth at bedtime 30 tablet 6   No current facility-administered medications for this visit.     Allergies:   Other; Sulfa antibiotics; Ultram [tramadol]; Buprenex [buprenorphine hcl]; Buprenorphine hcl; Keflex [cephalexin]; Levofloxacin; Oxycodone-acetaminophen; Yellow dye; Augmentin [amoxicillin-pot clavulanate]; Hydralazine hcl; Levofloxacin; Percocet [oxycodone-acetaminophen]; Tape; and Zetia [ezetimibe]   Social History:  The patient  reports that she has never smoked. She has never used  smokeless tobacco. She reports that she does not drink alcohol or use drugs.   Family History:  The patient's  family history includes Cancer in her brother; Heart attack in her brother, brother, brother, brother, and father; Parkinsonism in her brother; Stroke in her mother.    ROS:  Please see the history of present illness.  Otherwise, review of systems is positive for lower abdominal pain.  All other systems are reviewed and negative.    PHYSICAL EXAM: VS:  BP (!) 142/78   Pulse 70   Ht 5' 4.5" (1.638 m)   Wt 169 lb 12.8 oz (77 kg)   BMI 28.70 kg/m  , BMI Body mass index is 28.7 kg/m. GEN: Well nourished, well developed, pleasant elderly woman in no acute distress  HEENT: normal  Neck: no JVD, no masses. No carotid bruits Cardiac: RRR without murmur or gallop  Respiratory:  clear to auscultation bilaterally, normal work of breathing GI: soft, nontender, nondistended, + BS MS: no deformity or atrophy  Ext: no pretibial edema, pedal pulses 2+= bilaterally Skin: warm and dry, no rash Neuro:  Strength and sensation are intact Psych: euthymic mood, full affect  EKG:  EKG is ordered today. The ekg ordered today shows NSR 71 bpm, within normal limits  Recent Labs: 08/08/2015: BUN 12; Creatinine, Ser 0.67; Hemoglobin 13.5; Platelets 157; Potassium 4.0; Sodium 139   Lipid Panel     Component Value Date/Time   CHOL 158 01/05/2014 0346   TRIG 92 01/05/2014 0346   HDL 49 01/05/2014 0346   CHOLHDL 3.2 01/05/2014 0346   VLDL 18 01/05/2014 0346   LDLCALC 91 01/05/2014 0346      Wt Readings from Last 3 Encounters:  04/29/16 169 lb 12.8 oz (77 kg)  12/16/15 169 lb (76.7 kg)  09/11/15 170 lb (77.1 kg)     Cardiac Studies Reviewed: Cath 08-07-2015: Conclusion    Ost Ramus to Ramus lesion, 90% stenosed. Post intervention, there is a 0% residual stenosis.  Mid LAD lesion, 65% stenosed.  1. Severe single vessel CAD involving the ramus intermedius, successfully  treated with PCI following the [facility] Stent protocol. 2. Moderate LAD stenosis with negative FFR analysis 3. Widely patent, dominant left circumflex  DAPT x 30 days, then stop ASA. Continue plavix long-term.     ASSESSMENT AND PLAN: 1.  Tachypalpitations: pt with hx of pSVT. Linq report shows no arrhythmia except for isolated PVC. Will increase Bystolic to 5 mg daily.  2. HTN, uncontrolled: she brings in BP readings and they are consistently high. Increase Bystolic to 5 mg and she is given instructions on it's use if SBP > 180 mmHg  3. CAD, native vessel: no anginal symptoms. Continue clopidogrel.   Current medicines are reviewed with the patient today.  The patient does not have concerns regarding medicines.  Labs/ tests ordered today include:   Orders Placed This Encounter  Procedures  . EKG 12-Lead    Disposition:   FU 4 months  Signed, [name], MD  04/29/2016 2:36 PM    [facility] [address], [place], [place]  [address] Phone: [phone]; Fax: [phone]

## 2016-05-09 LAB — CUP PACEART REMOTE DEVICE CHECK
Date Time Interrogation Session: 20180104070949
Implantable Pulse Generator Implant Date: 20151016

## 2016-05-09 NOTE — Progress Notes (Signed)
Carelink summary report received. Battery status OK. Normal device function. No new symptom episodes, brady, or pause episodes.1 tachy- appears SVT/PAT. +bystolic increased d/t pt feeling tachy/palpitations. No new AF episodes. Monthly summary reports and ROV/PRN

## 2016-05-20 DIAGNOSIS — H00024 Hordeolum internum left upper eyelid: Secondary | ICD-10-CM | POA: Diagnosis not present

## 2016-05-23 LAB — CUP PACEART REMOTE DEVICE CHECK
Date Time Interrogation Session: 20180203070718
Implantable Pulse Generator Implant Date: 20151016

## 2016-05-23 NOTE — Progress Notes (Signed)
Carelink summary report received. Battery status OK. Normal device function. No new symptom episodes, tachy episodes, brady, or pause episodes. No new AF episodes. Monthly summary reports and ROV/PRN 

## 2016-05-25 ENCOUNTER — Ambulatory Visit (INDEPENDENT_AMBULATORY_CARE_PROVIDER_SITE_OTHER): Payer: PPO | Admitting: *Deleted

## 2016-05-25 DIAGNOSIS — I639 Cerebral infarction, unspecified: Secondary | ICD-10-CM | POA: Diagnosis not present

## 2016-05-25 NOTE — Progress Notes (Signed)
Carelink Summary Report / Loop Recorder 

## 2016-06-01 DIAGNOSIS — Z947 Corneal transplant status: Secondary | ICD-10-CM | POA: Diagnosis not present

## 2016-06-01 DIAGNOSIS — H5410 Blindness, one eye, low vision other eye, unspecified eyes: Secondary | ICD-10-CM | POA: Diagnosis not present

## 2016-06-24 ENCOUNTER — Ambulatory Visit (INDEPENDENT_AMBULATORY_CARE_PROVIDER_SITE_OTHER): Payer: PPO | Admitting: *Deleted

## 2016-06-24 DIAGNOSIS — I639 Cerebral infarction, unspecified: Secondary | ICD-10-CM | POA: Diagnosis not present

## 2016-06-24 NOTE — Progress Notes (Signed)
Carelink Summary Report / Loop Recorder 

## 2016-06-26 LAB — CUP PACEART REMOTE DEVICE CHECK
Date Time Interrogation Session: 20180305070839
MDC IDC PG IMPLANT DT: 20151016

## 2016-07-03 LAB — CUP PACEART REMOTE DEVICE CHECK
MDC IDC PG IMPLANT DT: 20151016
MDC IDC SESS DTM: 20180404073759

## 2016-07-09 ENCOUNTER — Telehealth: Payer: Self-pay | Admitting: Gastroenterology

## 2016-07-09 ENCOUNTER — Other Ambulatory Visit: Payer: Self-pay | Admitting: Gastroenterology

## 2016-07-09 DIAGNOSIS — K5792 Diverticulitis of intestine, part unspecified, without perforation or abscess without bleeding: Secondary | ICD-10-CM

## 2016-07-09 MED ORDER — METRONIDAZOLE 500 MG PO TABS: 500.0000 mg | ORAL_TABLET | Freq: Three times a day (TID) | ORAL | 0 refills | Status: AC

## 2016-07-09 NOTE — Telephone Encounter (Signed)
Patient started having LLQ abdominal pain 2-3 days ago, today she thought she was better but the pain came back. She is able to eat, having bm although more frequent, denies any blood or mucus in her stool, denies any fever. Hx of diverticulitis, numerous allergies.

## 2016-07-09 NOTE — Telephone Encounter (Signed)
I called the patient back. She has a history of left sided diverticulitis, she reports similar symptoms ongoing for 3 days. Left lower quadrant discomfort but no fevers / nausea / vomiting. Recommend empiric therapy with antibiotics. In review of her medications she has allergies to fluoroquinolones, sulfa, and augmentin. She reports being treated with flagyl only in the past. Will give her a week of flagyl dosed at 500mg  TID. If no improvement or worsening she needs to contact us for reassessment. She agreed.

## 2016-07-10 ENCOUNTER — Telehealth: Payer: Self-pay | Admitting: Gastroenterology

## 2016-07-15 NOTE — Telephone Encounter (Signed)
Pt has been taking Flagyl since last Thur TID. She c/o still have LLQ pain/soreness.  Pt wants to know if she needs to be taking Cipro at the same time? She states that she has the Cipro left over from another Rx.

## 2016-07-16 NOTE — Telephone Encounter (Signed)
Left message for pt to return call.

## 2016-07-16 NOTE — Telephone Encounter (Signed)
She has multiple allergies listed in her chart, including to Levaquin. I had not given her cipro given reported allergy to levaquin, but if she has tolerated cipro in the past and been given it, then yes she can take it if it is not expired. If her symptoms persist may be good to do basic labs to ensure normal (CBC, CMET) if you can order and have her go to the lab. thanks

## 2016-07-17 ENCOUNTER — Other Ambulatory Visit: Payer: Self-pay

## 2016-07-17 DIAGNOSIS — R1032 Left lower quadrant pain: Secondary | ICD-10-CM

## 2016-07-17 NOTE — Telephone Encounter (Addendum)
Pt states that she will go ahead and start Cipro today. Instructed pt to have labs done since she still c/o abdominal pain. Orders for CBC and CMET placed and pt will come in next week to have drawn.

## 2016-07-18 ENCOUNTER — Other Ambulatory Visit: Payer: Self-pay | Admitting: Cardiovascular Disease

## 2016-07-24 ENCOUNTER — Ambulatory Visit (INDEPENDENT_AMBULATORY_CARE_PROVIDER_SITE_OTHER): Payer: PPO | Admitting: *Deleted

## 2016-07-24 DIAGNOSIS — I639 Cerebral infarction, unspecified: Secondary | ICD-10-CM | POA: Diagnosis not present

## 2016-07-27 NOTE — Progress Notes (Signed)
Carelink Summary Report / Loop Recorder 

## 2016-07-31 DIAGNOSIS — B079 Viral wart, unspecified: Secondary | ICD-10-CM | POA: Diagnosis not present

## 2016-07-31 DIAGNOSIS — L570 Actinic keratosis: Secondary | ICD-10-CM | POA: Diagnosis not present

## 2016-07-31 DIAGNOSIS — D485 Neoplasm of uncertain behavior of skin: Secondary | ICD-10-CM | POA: Diagnosis not present

## 2016-07-31 DIAGNOSIS — Z85828 Personal history of other malignant neoplasm of skin: Secondary | ICD-10-CM | POA: Diagnosis not present

## 2016-07-31 DIAGNOSIS — L821 Other seborrheic keratosis: Secondary | ICD-10-CM | POA: Diagnosis not present

## 2016-08-06 LAB — CUP PACEART REMOTE DEVICE CHECK
Implantable Pulse Generator Implant Date: 20151016
MDC IDC SESS DTM: 20180504073740

## 2016-08-07 ENCOUNTER — Encounter: Payer: Self-pay | Admitting: *Deleted

## 2016-08-11 DIAGNOSIS — B029 Zoster without complications: Secondary | ICD-10-CM | POA: Diagnosis not present

## 2016-08-12 ENCOUNTER — Telehealth: Payer: Self-pay | Admitting: Gastroenterology

## 2016-08-12 NOTE — Telephone Encounter (Signed)
Okay. She should take her hyocyamine, likely related to IBS or symptomatic diverticular disease. She can follow up with me as needed if she thinks she needs a reassessment. Thanks

## 2016-08-12 NOTE — Telephone Encounter (Signed)
Patient advised to continue taking her Levsin for the cramping. She can follow up as needed.

## 2016-08-12 NOTE — Telephone Encounter (Signed)
Message routed to Dr. Armbruster. 

## 2016-08-12 NOTE — Telephone Encounter (Signed)
When I called patient back, she also wanted to report that she is having lower abdominal pain, relieved when she has a bowel movement. She describes it like "having a rubber band tightening around her abdomen" and then the pressure subsides with a bm. Denies any changes in her bm, no blood in stool. She states this has been going on for years, but she thought it might be getting worse.

## 2016-08-12 NOTE — Telephone Encounter (Signed)
It's fine, she can take acyclovir, should not interfere with anything in regards to her prior GI issues. thanks

## 2016-08-18 ENCOUNTER — Other Ambulatory Visit: Payer: Self-pay | Admitting: Gastroenterology

## 2016-08-20 ENCOUNTER — Other Ambulatory Visit: Payer: Self-pay

## 2016-08-20 MED ORDER — HYOSCYAMINE SULFATE 0.125 MG PO TABS: 0.1250 mg | ORAL_TABLET | ORAL | 4 refills | Status: DC | PRN

## 2016-08-20 NOTE — Telephone Encounter (Signed)
See Dr Doyne Keel note from 08-12-2016. Rx refilled as ordered.

## 2016-08-24 ENCOUNTER — Ambulatory Visit (INDEPENDENT_AMBULATORY_CARE_PROVIDER_SITE_OTHER): Payer: PPO | Admitting: *Deleted

## 2016-08-24 DIAGNOSIS — Z961 Presence of intraocular lens: Secondary | ICD-10-CM | POA: Diagnosis not present

## 2016-08-24 DIAGNOSIS — I639 Cerebral infarction, unspecified: Secondary | ICD-10-CM

## 2016-08-24 DIAGNOSIS — Z947 Corneal transplant status: Secondary | ICD-10-CM | POA: Diagnosis not present

## 2016-08-24 DIAGNOSIS — H5712 Ocular pain, left eye: Secondary | ICD-10-CM | POA: Diagnosis not present

## 2016-08-24 DIAGNOSIS — T86898 Other complications of other transplanted tissue: Secondary | ICD-10-CM | POA: Diagnosis not present

## 2016-08-24 NOTE — Progress Notes (Signed)
Carelink Summary Report / Loop Recorder 

## 2016-08-26 ENCOUNTER — Encounter: Payer: Self-pay | Admitting: Cardiology

## 2016-08-26 DIAGNOSIS — H44522 Atrophy of globe, left eye: Secondary | ICD-10-CM | POA: Diagnosis not present

## 2016-08-26 LAB — CUP PACEART REMOTE DEVICE CHECK
Date Time Interrogation Session: 20180603074458
Implantable Pulse Generator Implant Date: 20151016

## 2016-08-28 ENCOUNTER — Ambulatory Visit: Payer: PPO | Admitting: Cardiovascular Disease

## 2016-08-28 DIAGNOSIS — H5410 Blindness, one eye, low vision other eye, unspecified eyes: Secondary | ICD-10-CM | POA: Diagnosis not present

## 2016-08-28 DIAGNOSIS — H44522 Atrophy of globe, left eye: Secondary | ICD-10-CM | POA: Diagnosis not present

## 2016-08-28 DIAGNOSIS — H5710 Ocular pain, unspecified eye: Secondary | ICD-10-CM | POA: Diagnosis not present

## 2016-08-28 DIAGNOSIS — H5440 Blindness, one eye, unspecified eye: Secondary | ICD-10-CM | POA: Diagnosis not present

## 2016-08-31 ENCOUNTER — Encounter: Payer: Self-pay | Admitting: *Deleted

## 2016-08-31 DIAGNOSIS — Z006 Encounter for examination for normal comparison and control in clinical research program: Secondary | ICD-10-CM

## 2016-08-31 NOTE — Progress Notes (Signed)
LEADERS [employer] II research study month 12 follow up visit completed. No changes in medication.Patient states she had some chest pain with exertion x2 but did not take ntg either time. She had an appointment with Dr. Burt Knack that had to be rescheduled due to her Eye issue and possible surgery. I have encouraged both her and her daughter to get appointment. EKG obtained per study protocol. Next research required visit is telephone follow up @ years 2 and 3.

## 2016-09-07 ENCOUNTER — Other Ambulatory Visit: Payer: Self-pay | Admitting: Gastroenterology

## 2016-09-10 ENCOUNTER — Telehealth: Payer: Self-pay | Admitting: Cardiology

## 2016-09-10 DIAGNOSIS — H35371 Puckering of macula, right eye: Secondary | ICD-10-CM | POA: Diagnosis not present

## 2016-09-10 DIAGNOSIS — H353112 Nonexudative age-related macular degeneration, right eye, intermediate dry stage: Secondary | ICD-10-CM | POA: Diagnosis not present

## 2016-09-10 DIAGNOSIS — H35031 Hypertensive retinopathy, right eye: Secondary | ICD-10-CM | POA: Diagnosis not present

## 2016-09-10 DIAGNOSIS — H40013 Open angle with borderline findings, low risk, bilateral: Secondary | ICD-10-CM | POA: Diagnosis not present

## 2016-09-10 NOTE — Telephone Encounter (Signed)
LMOVM requesting that pt send manual transmission b/c home monitor has not updated in at least 14 days.    

## 2016-09-22 ENCOUNTER — Ambulatory Visit (INDEPENDENT_AMBULATORY_CARE_PROVIDER_SITE_OTHER): Payer: PPO | Admitting: *Deleted

## 2016-09-22 DIAGNOSIS — I639 Cerebral infarction, unspecified: Secondary | ICD-10-CM | POA: Diagnosis not present

## 2016-09-22 NOTE — Progress Notes (Signed)
Carelink Summary Report / Loop Recorder 

## 2016-09-24 ENCOUNTER — Telehealth: Payer: Self-pay | Admitting: Cardiology

## 2016-09-24 NOTE — Telephone Encounter (Signed)
Spoke w/ pt and requested that she send a manual transmission b/c her home monitor has not updated in at least 14 days.   

## 2016-09-25 DIAGNOSIS — S61216A Laceration without foreign body of right little finger without damage to nail, initial encounter: Secondary | ICD-10-CM | POA: Diagnosis not present

## 2016-10-01 ENCOUNTER — Encounter: Payer: Self-pay | Admitting: Cardiovascular Disease

## 2016-10-01 ENCOUNTER — Ambulatory Visit (INDEPENDENT_AMBULATORY_CARE_PROVIDER_SITE_OTHER): Payer: PPO | Admitting: Cardiovascular Disease

## 2016-10-01 VITALS — BP 132/72 | HR 83 | Ht 62.5 in | Wt 171.0 lb

## 2016-10-01 DIAGNOSIS — I2510 Atherosclerotic heart disease of native coronary artery without angina pectoris: Secondary | ICD-10-CM | POA: Diagnosis not present

## 2016-10-01 DIAGNOSIS — I10 Essential (primary) hypertension: Secondary | ICD-10-CM | POA: Diagnosis not present

## 2016-10-01 LAB — CUP PACEART REMOTE DEVICE CHECK
Implantable Pulse Generator Implant Date: 20151016
MDC IDC SESS DTM: 20180703105024

## 2016-10-01 NOTE — Progress Notes (Signed)
Cardiology Office Note Date:  10/01/2016   ID:  Kateri Balch, DOB September 07, 1924, MRN [id number]  PCP:  Darcus Austin, MD  Cardiologist:  [name], MD    Chief Complaint  Patient presents with  . Follow-up    CAD    History of Present Illness: [name] [name] is a 83 y.o. female who presents for  follow-up of hypertension, SVT, and coronary artery disease. Patient had a non-ST elevation infarction in 2015 and she was treated conservatively. She returned with anginal chest pain in May 2017 and underwent stenting of severe stenosis in the ramus intermedius. She was noted to have moderate LAD stenosis with medical therapy recommended. FFR was done and was negative. Left circumflex is widely patent and dominant. LV function has been normal. She has had long-standing symptomatic palpitations. Amiodarone has been recommended but she has declined because of concern about side effects. She has animplantable loop recorder.  The patient is here with her daughter today. She's been doing very well from a cardiac perspective. In the past she has had labile hypertension but she has not been checking her blood pressure much recently because she's been feeling well. She's been able to get out in her garden this summer without symptoms of chest pain, chest pressure, or shortness of breath. She denies lightheadedness or syncope. She's had a lot of problems with her left eye prosthesis and is considering surgery. She complains of a left temporal headache. She's concerned about her risk of anesthesia.  Past Medical History:  Diagnosis Date  . Arthritis    "thumbs, joints" (08/07/2015)  . Basal cell carcinoma    "several; scattered over my face, hands, leg some cut off; some burned off"  . Blind left eye    a. Corneal transplant x3 with rejection  . CAD (coronary artery disease)    a. s/p NSTEMI 2015 treated conservatively; nuclear stress test low risk. b. Continued angina despite med rx 07/2015 - s/p io  Freedom Stent to ramus intermedius with mod LAD stenosis neg by FFR.  Marland Kitchen Chronic right hip pain   . Complication of anesthesia    "very sensitive to RX"  . Diverticulitis large intestine   . Diverticulosis   . Gastritis   . GERD (gastroesophageal reflux disease)   . Hayfever   . Helicobacter pylori (H. pylori) 05/22/02   RUT-Positive  . Hypertension   . Myocardial infarction ([place]) 2015  . Pre-diabetes   . Squamous carcinoma    "several; scattered over my face, hands, leg some cut off; some burned off"  . Stroke [facility]) 2015   denies residual on 08/07/2015  . SVT (supraventricular tachycardia) (HCC)    a. seen by Dr. Caryl Comes - has loop recorder in. Patient has declined amiodarone due to side effect profile  . Varicose veins     Past Surgical History:  Procedure Laterality Date  . BASAL CELL CARCINOMA EXCISION    . CARDIAC CATHETERIZATION N/A 08/07/2015   Procedure: Left Heart Cath and Coronary Angiography;  Surgeon: [name], MD;  Location: [facility];  Service: Cardiovascular;  Laterality: N/A;  . CARDIAC CATHETERIZATION N/A 08/07/2015   Procedure: Coronary Stent Intervention;  Surgeon: [name], MD;  Location: [place] [facility];  Service: Cardiovascular;  Laterality: N/A;  . CARDIAC CATHETERIZATION N/A 08/07/2015   Procedure: Intravascular Pressure Wire/FFR Study;  Surgeon: [name], MD;  Location: [place] [facility];  Service: Cardiovascular;  Laterality: N/A;  . CATARACT EXTRACTION W/ INTRAOCULAR LENS  IMPLANT,  BILATERAL Bilateral 2005  . CLOSED REDUCTION SHOULDER DISLOCATION Left 2016 X 2  . CORNEAL TRANSPLANT  right 2009, left 2009   both eyes, 3 in left eye, 1 in right eye  . CORONARY ANGIOPLASTY WITH STENT PLACEMENT  08/07/2015   "1 stent"  . EYE SURGERY    . JOINT REPLACEMENT    . [place]   "had gangrene in it"  . LOOP RECORDER IMPLANT N/A 01/05/2014   Procedure: LOOP RECORDER IMPLANT;  Surgeon: Coralyn Mark, MD;   Location: [facility];  Service: Cardiovascular;  Laterality: N/A;  . SQUAMOUS CELL CARCINOMA EXCISION    . TONSILLECTOMY    . TOTAL ABDOMINAL HYSTERECTOMY  11/1983   "ovaries and all"  . TOTAL KNEE ARTHROPLASTY Left 06/27/2012   Procedure: LEFT TOTAL KNEE ARTHROPLASTY;  Surgeon: Gearlean Alf, MD;  Location: [facility];  Service: Orthopedics;  Laterality: Left;  . TOTAL KNEE ARTHROPLASTY Right 12/12/2012   Procedure: RIGHT TOTAL KNEE ARTHROPLASTY;  Surgeon: Gearlean Alf, MD;  Location: [facility];  Service: Orthopedics;  Laterality: Right;  . TUBAL LIGATION  1966    Current Outpatient Prescriptions  Medication Sig Dispense Refill  . amLODipine (NORVASC) 2.5 MG tablet Take 2.5 mg by mouth daily as needed (BLOOD PRESSURE). Only take if greater than 140    . clopidogrel (PLAVIX) 75 MG tablet take 1 tablet by mouth once daily 90 tablet 3  . hyoscyamine (LEVSIN, ANASPAZ) 0.125 MG tablet Take 1 tablet (0.125 mg total) by mouth every 4 (four) hours as needed for cramping. 30 tablet 4  . Melatonin 1 MG SUBL Place 1 mg under the tongue at bedtime.    . Multiple Vitamin (MULTI VITAMIN DAILY PO) Take 1 tablet by mouth every morning.    . nebivolol (BYSTOLIC) 5 MG tablet Take one tablet by mouth daily.  If SBP is greater than 180 you can take an additional 7m daily as needed 60 tablet 6  . nitroGLYCERIN (NITROSTAT) 0.4 MG SL tablet Place 1 tablet (0.4 mg total) under the tongue every 5 (five) minutes as needed for chest pain. 25 tablet 2  . Polyethyl Glycol-Propyl Glycol (SYSTANE ULTRA) 0.4-0.3 % SOLN Apply 1 drop to eye 2 (two) times daily.     . prednisoLONE acetate (PRED FORTE) 1 % ophthalmic suspension Place 1 drop into both eyes 2 (two) times daily.     . ranitidine (ZANTAC) 150 MG tablet take 1 tablet by mouth at bedtime 30 tablet 1   No current facility-administered medications for this visit.     Allergies:   Other; Sulfa antibiotics; Ultram [tramadol]; Buprenex [buprenorphine hcl]; Buprenorphine  hcl; Keflex [cephalexin]; Levofloxacin; Oxycodone-acetaminophen; Yellow dye; Augmentin [amoxicillin-pot clavulanate]; Hydralazine hcl; Levofloxacin; Percocet [oxycodone-acetaminophen]; Tape; and Zetia [ezetimibe]   Social History:  The patient  reports that she has never smoked. She has never used smokeless tobacco. She reports that she does not drink alcohol or use drugs.   Family History:  The patient's  family history includes Cancer in her brother; Heart attack in her brother, brother, brother, brother, and father; Parkinsonism in her brother; Stroke in her mother.   ROS:  Please see the history of present illness.  All other systems are reviewed and negative.   PHYSICAL EXAM: VS:  BP 132/72   Pulse 83   Ht 5' 2.5" (1.588 m)   Wt 171 lb (77.6 kg)   SpO2 96%   BMI 30.78 kg/m  , BMI Body mass index is 30.78  kg/m. GEN: Well nourished, well developed, pleasant elderly woman in no acute distress  HEENT: normal  Neck: no JVD, no masses. No carotid bruits Cardiac: RRR without murmur or gallop                Respiratory:  clear to auscultation bilaterally, normal work of breathing GI: soft, nontender, nondistended, + BS MS: no deformity or atrophy  Ext: no pretibial edema, pedal pulses 2+= bilaterally Skin: warm and dry, no rash Neuro:  Strength and sensation are intact Psych: euthymic mood, full affect  EKG:  EKG is not ordered today.  Recent Labs: No results found for requested labs within last 8760 hours.   Lipid Panel     Component Value Date/Time   CHOL 158 01/05/2014 0346   TRIG 92 01/05/2014 0346   HDL 49 01/05/2014 0346   CHOLHDL 3.2 01/05/2014 0346   VLDL 18 01/05/2014 0346   LDLCALC 91 01/05/2014 0346      Wt Readings from Last 3 Encounters:  10/01/16 171 lb (77.6 kg)  04/29/16 169 lb 12.8 oz (77 kg)  12/16/15 169 lb (76.7 kg)    Cardiac Studies Reviewed: Cath 08/07/2015: Conclusion    Ost Ramus to Ramus lesion, 90% stenosed. Post intervention, there is a  0% residual stenosis.  Mid LAD lesion, 65% stenosed.   1. Severe single vessel CAD involving the ramus intermedius, successfully treated with PCI following the BioFreedom Stent protocol. 2. Moderate LAD stenosis with negative FFR analysis 3. Widely patent, dominant left circumflex  DAPT x 30 days, then stop ASA. Continue plavix long-term.   Indications   Other forms of angina pectoris ([place]) [I20.8 ([id number])]  Complications   Complications documented in old activity   INDICATION: Chest pain, recurrent, CCS Class 3 angina   PROCEDURAL DETAILS: The right wrist was prepped, draped, and anesthetized with 1% lidocaine. Using the modified Seldinger technique, a 5/6 French Slender sheath was introduced into the right radial artery. 3 mg of verapamil was administered through the sheath, weight-based unfractionated heparin was administered intravenously. There was marked tortuosity of the right subclavian artery and the innominate. There was a full 360 degree loop that could not be navigated. This access site was aborted and attention turned to the left radial. Access was obtained in similar fashion without difficulty. There was marked tortuosity of the left subclavian and angulation into the ascending aorta as well. I was able to image the RCA with a 5 Fr guide catheter and an 0.035 wire inside of the guide. Despite breathing maneuvers, use of guide catheters, and use of a stiff wire for catheter exchanges, I was unable to complete the procedure from the left radial. Attention was turned to the right groin. A 4 Fr sheath was inserted without difficulty and angiography of the LCA was performed. Following diagnostic angiography, the sheath was upsized to a 6 Fr for the PCI procedure. FFR of the LAD and PCI of the Ramus were then performed - see report for details. Catheter exchanges were performed over an exchange length guidewire. There were no immediate procedural complications. A TR band was used for  radial hemostasis at the completion of the procedure. A Perclose device was used for femoral hemostasis. The patient was transferred to the post catheterization recovery area for further monitoring.   During this procedure the patient is administered a total of Versed 4 mg and Fentanyl 150 mg to achieve and maintain moderate conscious sedation. The patient's heart rate, blood pressure, and oxygen saturation are  monitored continuously during the procedure. The period of conscious sedation is 127 minutes, of which I was present face-to-face 100% of this time.  Estimated blood loss <50 mL. There were no immediate complications during the procedure.      Coronary Findings   Dominance: Left  Left Anterior Descending  Mid LAD lesion, 65% stenosed. Moderate segmental stenosis of the proximal and mid-LAD. The [place] FFR wire is used to cross the lesion. Baseline FFR is 1.0. FFR with IV adenosine is 0.85.  Ramus Intermedius  Ost Ramus to Ramus lesion, 90% stenosed.  PCI: The pre-interventional distal flow is normal (TIMI 3). Pre-stent angioplasty was performed. A drug-eluting stent was placed. Post-stent angioplasty was performed. Maximum pressure: 18 atm. The post-interventional distal flow is normal (TIMI 3). The intervention was successful. No complications occurred at this lesion. Angiomax is used for anticoagulation. An XB-LAD 4.0 cm guide is used (6 Fr). A Cougar wire is used to cross the lesion. The lesion is dilated with a 2.0 balloon, then stented with a 2.5x14 mm BioFreedom Study Stent. The stent is post-dilated with a 2.5 mm Ralston balloon to 18 atm.  There is no residual stenosis post intervention.  Coronary Diagrams   Diagnostic Diagram       Post-Intervention Diagram          ASSESSMENT AND PLAN: 1.  Coronary artery disease, native vessel, without angina: The patient is currently stable. She has good functional capacity considering her advanced age and has no symptoms of angina.  She continues on clopidogrel monotherapy, and a beta blocker. She will return in 6 months for follow-up.  2. Tachycardia-palpitations: Significant improvement in symptoms since her last visit. Implantable loop monitor is in place in my knowledge has not demonstrated any significant arrhythmia late.  3. Hypertension: Blood pressure appears to be well controlled. At the time of her last visit by systolic was increased to 5 mg daily.  4. Preoperative assessment: Patient appears to be stable from a cardiac perspective. She has normal LV function and no symptoms of angina at present. I think her cardiac risk of surgery would be intermediate at most and certainly not prohibitive. I don't think risk stratification with stress testing is indicated. Recommend continuing her current medical regimen and moving forward with surgery if that is the most appropriate treatment for her eye problem.  Current medicines are reviewed with the patient today.  The patient does not have concerns regarding medicines.  Labs/ tests ordered today include:  No orders of the defined types were placed in this encounter.   Disposition:   FU 6 months  Signed, [name], MD  10/01/2016 5:20 PM    [facility] [place], [place], [place]  [address] Phone: [phone]; Fax: [phone]

## 2016-10-01 NOTE — Patient Instructions (Signed)

## 2016-10-17 ENCOUNTER — Encounter: Payer: Self-pay | Admitting: Cardiovascular Disease

## 2016-10-22 ENCOUNTER — Ambulatory Visit (INDEPENDENT_AMBULATORY_CARE_PROVIDER_SITE_OTHER): Payer: PPO | Admitting: *Deleted

## 2016-10-22 DIAGNOSIS — I639 Cerebral infarction, unspecified: Secondary | ICD-10-CM | POA: Diagnosis not present

## 2016-10-22 NOTE — Progress Notes (Signed)
Carelink Summary Report / Loop Recorder 

## 2016-10-30 DIAGNOSIS — H44522 Atrophy of globe, left eye: Secondary | ICD-10-CM | POA: Diagnosis not present

## 2016-10-30 DIAGNOSIS — H1851 Endothelial corneal dystrophy: Secondary | ICD-10-CM | POA: Diagnosis not present

## 2016-11-02 DIAGNOSIS — L03115 Cellulitis of right lower limb: Secondary | ICD-10-CM | POA: Diagnosis not present

## 2016-11-02 DIAGNOSIS — W19XXXA Unspecified fall, initial encounter: Secondary | ICD-10-CM | POA: Diagnosis not present

## 2016-11-02 DIAGNOSIS — S8011XA Contusion of right lower leg, initial encounter: Secondary | ICD-10-CM | POA: Diagnosis not present

## 2016-11-03 LAB — CUP PACEART REMOTE DEVICE CHECK
Date Time Interrogation Session: 20180814083229
MDC IDC PG IMPLANT DT: 20151016

## 2016-11-06 ENCOUNTER — Other Ambulatory Visit: Payer: Self-pay

## 2016-11-06 MED ORDER — RANITIDINE HCL 150 MG PO TABS: 150.0000 mg | ORAL_TABLET | Freq: Every day | ORAL | 3 refills | Status: DC

## 2016-11-09 DIAGNOSIS — L0390 Cellulitis, unspecified: Secondary | ICD-10-CM | POA: Diagnosis not present

## 2016-11-09 DIAGNOSIS — L02419 Cutaneous abscess of limb, unspecified: Secondary | ICD-10-CM | POA: Diagnosis not present

## 2016-11-24 ENCOUNTER — Ambulatory Visit (INDEPENDENT_AMBULATORY_CARE_PROVIDER_SITE_OTHER): Payer: PPO | Admitting: *Deleted

## 2016-11-24 DIAGNOSIS — I639 Cerebral infarction, unspecified: Secondary | ICD-10-CM | POA: Diagnosis not present

## 2016-11-25 NOTE — Progress Notes (Signed)
Carelink Summary Report / Loop Recorder 

## 2016-11-26 LAB — CUP PACEART REMOTE DEVICE CHECK
Implantable Pulse Generator Implant Date: 20151016
MDC IDC SESS DTM: 20180901121330

## 2016-12-02 ENCOUNTER — Telehealth: Payer: Self-pay | Admitting: Internal Medicine

## 2016-12-02 NOTE — Telephone Encounter (Signed)
New message  Pt call requesting to speak with RN about a denial letter she received from her insurance company. Pt states the insurance company states it was something Dr. Rayann Heman needs to sign off on .please call back to discuss

## 2016-12-02 NOTE — Telephone Encounter (Signed)
Follow Up:; ° ° °Returning your call. °

## 2016-12-02 NOTE — Telephone Encounter (Signed)
Returned call to patient and she is questioning why insurance is denying LINQ charge DOS 09/22/16.  Can you help with this and contact patient please.

## 2016-12-02 NOTE — Telephone Encounter (Signed)
Left message for patient to return my call.

## 2016-12-03 NOTE — Telephone Encounter (Signed)
I will call patient Claiborne Billings.  Thanks.

## 2016-12-07 ENCOUNTER — Telehealth: Payer: Self-pay | Admitting: Internal Medicine

## 2016-12-09 DIAGNOSIS — L989 Disorder of the skin and subcutaneous tissue, unspecified: Secondary | ICD-10-CM | POA: Diagnosis not present

## 2016-12-09 DIAGNOSIS — R350 Frequency of micturition: Secondary | ICD-10-CM | POA: Diagnosis not present

## 2016-12-19 NOTE — Telephone Encounter (Signed)
Closed encounter °

## 2016-12-21 ENCOUNTER — Ambulatory Visit (INDEPENDENT_AMBULATORY_CARE_PROVIDER_SITE_OTHER): Payer: PPO | Admitting: *Deleted

## 2016-12-21 DIAGNOSIS — I639 Cerebral infarction, unspecified: Secondary | ICD-10-CM

## 2016-12-21 NOTE — Progress Notes (Signed)
Carelink Summary Report / Loop Recorder 

## 2016-12-22 ENCOUNTER — Encounter: Payer: Self-pay | Admitting: Cardiovascular Disease

## 2016-12-22 ENCOUNTER — Telehealth: Payer: Self-pay

## 2016-12-22 NOTE — Telephone Encounter (Addendum)
I wanted to make sure that it is OK for Mom to take magnesium citrate 250 mg the last thing at night. I didn't want her to take anything that would interact with her medicine.     Thanks so much for your help  [name] [name]    Responding to [employer] above. Patient's daughter states they starting giving her magnesium citrate 250 mg nightly for cramps in her legs.  Confirmed with her it does not make her mom stay up due to making her go to the bathroom.  Informed her it should be fine to take and to call if she needs anything. She was grateful for call.

## 2016-12-23 NOTE — Telephone Encounter (Signed)
Agree - no issues with her taking magnesium citrate.

## 2016-12-24 LAB — CUP PACEART REMOTE DEVICE CHECK
Implantable Pulse Generator Implant Date: 20151016
MDC IDC SESS DTM: 20181001141401

## 2017-01-20 ENCOUNTER — Ambulatory Visit (INDEPENDENT_AMBULATORY_CARE_PROVIDER_SITE_OTHER): Payer: PPO | Admitting: *Deleted

## 2017-01-20 DIAGNOSIS — I639 Cerebral infarction, unspecified: Secondary | ICD-10-CM | POA: Diagnosis not present

## 2017-01-21 NOTE — Progress Notes (Signed)
Carelink Summary Report / Loop Recorder 

## 2017-01-22 LAB — CUP PACEART REMOTE DEVICE CHECK
Implantable Pulse Generator Implant Date: 20151016
MDC IDC SESS DTM: 20181031144125

## 2017-02-04 DIAGNOSIS — H00024 Hordeolum internum left upper eyelid: Secondary | ICD-10-CM | POA: Diagnosis not present

## 2017-02-16 DIAGNOSIS — Z85828 Personal history of other malignant neoplasm of skin: Secondary | ICD-10-CM | POA: Diagnosis not present

## 2017-02-16 DIAGNOSIS — L821 Other seborrheic keratosis: Secondary | ICD-10-CM | POA: Diagnosis not present

## 2017-02-16 DIAGNOSIS — L570 Actinic keratosis: Secondary | ICD-10-CM | POA: Diagnosis not present

## 2017-02-16 DIAGNOSIS — Z23 Encounter for immunization: Secondary | ICD-10-CM | POA: Diagnosis not present

## 2017-02-19 ENCOUNTER — Ambulatory Visit (INDEPENDENT_AMBULATORY_CARE_PROVIDER_SITE_OTHER): Payer: PPO | Admitting: *Deleted

## 2017-02-19 DIAGNOSIS — I639 Cerebral infarction, unspecified: Secondary | ICD-10-CM

## 2017-02-22 NOTE — Progress Notes (Signed)
Carelink Summary Report / Loop Recorder 

## 2017-02-25 ENCOUNTER — Telehealth: Payer: Self-pay | Admitting: Cardiovascular Disease

## 2017-02-25 MED ORDER — AMLODIPINE BESYLATE 2.5 MG PO TABS: 2.5000 mg | ORAL_TABLET | Freq: Every day | ORAL | 3 refills | Status: DC | PRN

## 2017-02-25 NOTE — Telephone Encounter (Signed)
Patient is complaining of elevated BP's. Patient denies SOB or Chest pain. Patient stated she feels okay, but not great. Reviewed patient's medications. Patient is suppose to take an extra Bystolic 5 mg for SBP over 180 and Amlodipine 2.5 mg for SBP for SBP 140. Patient heart rate is in the 70's. Encouraged patient to take her a Bystolic now. Patient stated she takes Bystolic at night. Encouraged patient to check her BP before she takes her Bystolic night time dose. Informed patient if her BP is too low, SBP below 114, she can hold her night time dose. Informed patient to give our office a call if her BP does not improve with the Bystolic. Will forward to Dr. Burt Knack for further advisement.

## 2017-02-25 NOTE — Telephone Encounter (Signed)
New message   Patient daughter calling with BP concerns. States BP has been high for 2 days.     Pt c/o BP issue: STAT if pt c/o blurred vision, one-sided weakness or slurred speech  1. What are your last 5 BP readings? 209/90, 207/91, 178/84  2. Are you having any other symptoms (ex. Dizziness, headache, blurred vision, passed out)? NO  3. What is your BP issue? High BP

## 2017-02-25 NOTE — Telephone Encounter (Signed)
Spoke with both daughter and patient.  Instructed her to take Amlodipine 2.5 mg daily (while still taking Bystolic) at least until her BP normalizes. Instructed her to take BP daily for a week and call next week with readings.  She understands to call prior to that time if she gets symptoms or if BP does not improve in the meantime. They were grateful for call.

## 2017-02-25 NOTE — Telephone Encounter (Signed)
This seems reasonable.  Might be best to take amlodipine 2.5 mg daily until blood pressure comes into a normal range.

## 2017-02-26 ENCOUNTER — Telehealth: Payer: Self-pay | Admitting: Physician Assistant

## 2017-02-26 NOTE — Telephone Encounter (Signed)
Paged by answering service.BP now improved to 158/66 after addition of amlodipine.   Advise to continue Bystolic 5mg  PM and Amlodipine 2.5mg  AM. Take extra 5mg  of Bystolic if SBP above 464.   Recently had hypotension leading to reducing Bystolic to one today. Advised daughter not to make so many changes at a time. Will continue to observe for few days.

## 2017-02-27 ENCOUNTER — Telehealth: Payer: Self-pay | Admitting: Cardiology

## 2017-02-27 NOTE — Telephone Encounter (Signed)
Pt'd daughter called- 3'd day in a row-her mother's B/P has been labile. She has been instructions (different) with each call about PRN dosing of Amlodipine and Bystolic. I suggested she not be too concerned unless her mother B/P is greater than 259 systolic or less than 563 systolic. Take the Bystolic Q HS,  Amlodipine 2.5 mg PRN systolic B/P > 875.    Kerin Ransom PA-C 02/27/2017 3:30 PM

## 2017-03-02 LAB — CUP PACEART REMOTE DEVICE CHECK
Date Time Interrogation Session: 20181130150915
MDC IDC PG IMPLANT DT: 20151016

## 2017-03-04 ENCOUNTER — Telehealth: Payer: Self-pay | Admitting: *Deleted

## 2017-03-04 ENCOUNTER — Telehealth: Payer: Self-pay | Admitting: Cardiovascular Disease

## 2017-03-04 NOTE — Telephone Encounter (Signed)
New message   Patient daughter to confirm if her mother needs to continue amLODipine (NORVASC) 2.5 MG tablet  Please call  Pt c/o medication issue:  1. Name of Medication: amLODipine (NORVASC) 2.5 MG tablet  2. How are you currently taking this medication (dosage and times per day)? As prescribed  3. Are you having a reaction (difficulty breathing--STAT)? NO  4. What is your medication issue? BP fluctuation. The daughter was in the car, states she did not have BP readings.

## 2017-03-04 NOTE — Telephone Encounter (Signed)
Attempted to reach patient at home number--no answer, generic answering machine.  Able to reach [place] ([facility]).  Advised her of LINQ at [facility] and options available at this point.  Jeral Fruit that patient's home monitor will no longer transmit automatically and that I will order a return kit for the monitor to patient's home address.  Explained that "battery" change is not an option per se, but that if patient is interested in another loop recorder, or if she wishes to discuss explant, she should schedule an appointment with Dr. Rayann Heman.  [name] verbalizes understanding and states that she will discuss options with the patient.  She agrees to call back if patient would like to see Dr. Rayann Heman, or if they have any additional questions or concerns.  Unenrolled from [place], return kit ordered.

## 2017-03-04 NOTE — Telephone Encounter (Addendum)
DPR states the patient's BP dropped to 934 systolic, called the on-call and was told to only take amlodipine 2.5 mg if BP is greater than 068 systolic. She calls today to ask if she should stop taking since she does not feel well if her BP is less than the upper 403V systolic. Reiterated medication instructions. She understands she can have amlodipine qd PRN if BP is over 533 systolic. Rescheduled patient to 12/28 for evaluation with Dr. Burt Knack. She will want to talk to Dr. Burt Knack about [place] and whether or not she should get another one or get explant (LINQ is at [facility] and return kit sent to patient 12/13).  DPR was grateful for assistance.

## 2017-03-08 ENCOUNTER — Telehealth: Payer: Self-pay | Admitting: Cardiovascular Disease

## 2017-03-08 NOTE — Telephone Encounter (Signed)
New Message  Pt daughter call requesting to speak with RN. She states there is some confusing with what pt is to take when bp is high; bystolic or the amlodipine. please call back to discuss

## 2017-03-08 NOTE — Telephone Encounter (Signed)
Patient takes her Bystolic at 2423. Instructed DPR to have patient check her BP once daily 2 hours after taking Bystolic. If her BP is elevated (systolic greater than 536-144), then she should take amlodipine 2.5 mg.  She was grateful for call and agrees with treatment plan.

## 2017-03-11 ENCOUNTER — Telehealth: Payer: Self-pay | Admitting: Internal Medicine

## 2017-03-11 ENCOUNTER — Telehealth: Payer: Self-pay | Admitting: Cardiology

## 2017-03-11 NOTE — Telephone Encounter (Signed)
Received page from patient's daughter.  Her blood pressure is elevated in the [id number]-263Z systolic over several readings over the past 1-2 hours.  Patient denies any acute complaints.  They are inquiring about using her as needed amlodipine dose; I advised her that this was reasonable to do at this point.  She will take 2.5 mg amlodipine this evening.  Patient's daughter was advised on more urgent symptoms that should provide prompt ED evaluation.  She will plan to call the office in the morning if her blood pressures remain elevated.  She does have follow-up next week on December 28.

## 2017-03-11 NOTE — Telephone Encounter (Signed)
bp elevated, pt forgot to take bystolic   At 76:18 am bp 173/83 Pt took nitro which  relieved  Cp.  Pt  Took bystolic.  And nitro  Follow up  Clinic  AM

## 2017-03-18 ENCOUNTER — Inpatient Hospital Stay (HOSPITAL_COMMUNITY): Payer: PPO

## 2017-03-18 ENCOUNTER — Telehealth: Payer: Self-pay | Admitting: Cardiovascular Disease

## 2017-03-18 ENCOUNTER — Emergency Department (HOSPITAL_COMMUNITY): Payer: PPO

## 2017-03-18 ENCOUNTER — Inpatient Hospital Stay (HOSPITAL_COMMUNITY)
Admission: EM | Admit: 2017-03-18 | Discharge: 2017-03-24 | DRG: 065 | Disposition: A | Discharge status: Rehabilitation Facility | Payer: PPO | Attending: Internal Medicine | Admitting: Internal Medicine

## 2017-03-18 DIAGNOSIS — I6523 Occlusion and stenosis of bilateral carotid arteries: Secondary | ICD-10-CM | POA: Diagnosis present

## 2017-03-18 DIAGNOSIS — Z947 Corneal transplant status: Secondary | ICD-10-CM | POA: Diagnosis not present

## 2017-03-18 DIAGNOSIS — Z85828 Personal history of other malignant neoplasm of skin: Secondary | ICD-10-CM

## 2017-03-18 DIAGNOSIS — G479 Sleep disorder, unspecified: Secondary | ICD-10-CM | POA: Diagnosis not present

## 2017-03-18 DIAGNOSIS — Z79899 Other long term (current) drug therapy: Secondary | ICD-10-CM

## 2017-03-18 DIAGNOSIS — Z8679 Personal history of other diseases of the circulatory system: Secondary | ICD-10-CM

## 2017-03-18 DIAGNOSIS — R29702 NIHSS score 2: Secondary | ICD-10-CM | POA: Diagnosis not present

## 2017-03-18 DIAGNOSIS — I5033 Acute on chronic diastolic (congestive) heart failure: Secondary | ICD-10-CM | POA: Diagnosis not present

## 2017-03-18 DIAGNOSIS — R208 Other disturbances of skin sensation: Secondary | ICD-10-CM | POA: Diagnosis not present

## 2017-03-18 DIAGNOSIS — Z96652 Presence of left artificial knee joint: Secondary | ICD-10-CM | POA: Diagnosis not present

## 2017-03-18 DIAGNOSIS — I252 Old myocardial infarction: Secondary | ICD-10-CM | POA: Diagnosis not present

## 2017-03-18 DIAGNOSIS — R29810 Facial weakness: Secondary | ICD-10-CM | POA: Diagnosis not present

## 2017-03-18 DIAGNOSIS — Z7902 Long term (current) use of antithrombotics/antiplatelets: Secondary | ICD-10-CM

## 2017-03-18 DIAGNOSIS — R2990 Unspecified symptoms and signs involving the nervous system: Secondary | ICD-10-CM

## 2017-03-18 DIAGNOSIS — H5440 Blindness, one eye, unspecified eye: Secondary | ICD-10-CM | POA: Diagnosis present

## 2017-03-18 DIAGNOSIS — G8929 Other chronic pain: Secondary | ICD-10-CM | POA: Diagnosis not present

## 2017-03-18 DIAGNOSIS — I2510 Atherosclerotic heart disease of native coronary artery without angina pectoris: Secondary | ICD-10-CM | POA: Diagnosis present

## 2017-03-18 DIAGNOSIS — I6302 Cerebral infarction due to thrombosis of basilar artery: Secondary | ICD-10-CM | POA: Diagnosis not present

## 2017-03-18 DIAGNOSIS — Z90710 Acquired absence of both cervix and uterus: Secondary | ICD-10-CM | POA: Diagnosis not present

## 2017-03-18 DIAGNOSIS — H538 Other visual disturbances: Secondary | ICD-10-CM | POA: Diagnosis present

## 2017-03-18 DIAGNOSIS — M25551 Pain in right hip: Secondary | ICD-10-CM | POA: Diagnosis not present

## 2017-03-18 DIAGNOSIS — Z882 Allergy status to sulfonamides status: Secondary | ICD-10-CM

## 2017-03-18 DIAGNOSIS — K219 Gastro-esophageal reflux disease without esophagitis: Secondary | ICD-10-CM | POA: Diagnosis not present

## 2017-03-18 DIAGNOSIS — I6621 Occlusion and stenosis of right posterior cerebral artery: Secondary | ICD-10-CM | POA: Diagnosis present

## 2017-03-18 DIAGNOSIS — H53462 Homonymous bilateral field defects, left side: Secondary | ICD-10-CM | POA: Diagnosis not present

## 2017-03-18 DIAGNOSIS — M1710 Unilateral primary osteoarthritis, unspecified knee: Secondary | ICD-10-CM | POA: Diagnosis present

## 2017-03-18 DIAGNOSIS — I10 Essential (primary) hypertension: Secondary | ICD-10-CM | POA: Diagnosis not present

## 2017-03-18 DIAGNOSIS — Z9842 Cataract extraction status, left eye: Secondary | ICD-10-CM | POA: Diagnosis not present

## 2017-03-18 DIAGNOSIS — Z881 Allergy status to other antibiotic agents status: Secondary | ICD-10-CM | POA: Diagnosis not present

## 2017-03-18 DIAGNOSIS — E785 Hyperlipidemia, unspecified: Secondary | ICD-10-CM | POA: Diagnosis present

## 2017-03-18 DIAGNOSIS — F329 Major depressive disorder, single episode, unspecified: Secondary | ICD-10-CM | POA: Diagnosis not present

## 2017-03-18 DIAGNOSIS — I639 Cerebral infarction, unspecified: Secondary | ICD-10-CM | POA: Diagnosis present

## 2017-03-18 DIAGNOSIS — Z8673 Personal history of transient ischemic attack (TIA), and cerebral infarction without residual deficits: Secondary | ICD-10-CM | POA: Diagnosis not present

## 2017-03-18 DIAGNOSIS — E876 Hypokalemia: Secondary | ICD-10-CM | POA: Diagnosis present

## 2017-03-18 DIAGNOSIS — F32A Depression, unspecified: Secondary | ICD-10-CM | POA: Diagnosis present

## 2017-03-18 DIAGNOSIS — Z9109 Other allergy status, other than to drugs and biological substances: Secondary | ICD-10-CM | POA: Diagnosis not present

## 2017-03-18 DIAGNOSIS — E871 Hypo-osmolality and hyponatremia: Secondary | ICD-10-CM | POA: Diagnosis present

## 2017-03-18 DIAGNOSIS — Z961 Presence of intraocular lens: Secondary | ICD-10-CM | POA: Diagnosis not present

## 2017-03-18 DIAGNOSIS — Z96653 Presence of artificial knee joint, bilateral: Secondary | ICD-10-CM | POA: Diagnosis not present

## 2017-03-18 DIAGNOSIS — H548 Legal blindness, as defined in USA: Secondary | ICD-10-CM | POA: Diagnosis not present

## 2017-03-18 DIAGNOSIS — Z888 Allergy status to other drugs, medicaments and biological substances status: Secondary | ICD-10-CM

## 2017-03-18 DIAGNOSIS — Z955 Presence of coronary angioplasty implant and graft: Secondary | ICD-10-CM

## 2017-03-18 DIAGNOSIS — Z820 Family history of epilepsy and other diseases of the nervous system: Secondary | ICD-10-CM

## 2017-03-18 DIAGNOSIS — Z91048 Other nonmedicinal substance allergy status: Secondary | ICD-10-CM | POA: Diagnosis not present

## 2017-03-18 DIAGNOSIS — I69319 Unspecified symptoms and signs involving cognitive functions following cerebral infarction: Secondary | ICD-10-CM | POA: Diagnosis not present

## 2017-03-18 DIAGNOSIS — Z885 Allergy status to narcotic agent status: Secondary | ICD-10-CM | POA: Diagnosis not present

## 2017-03-18 DIAGNOSIS — Z91041 Radiographic dye allergy status: Secondary | ICD-10-CM | POA: Diagnosis not present

## 2017-03-18 DIAGNOSIS — M179 Osteoarthritis of knee, unspecified: Secondary | ICD-10-CM | POA: Diagnosis present

## 2017-03-18 DIAGNOSIS — I471 Supraventricular tachycardia: Secondary | ICD-10-CM | POA: Diagnosis not present

## 2017-03-18 DIAGNOSIS — H4901 Third [oculomotor] nerve palsy, right eye: Secondary | ICD-10-CM | POA: Diagnosis not present

## 2017-03-18 DIAGNOSIS — Z823 Family history of stroke: Secondary | ICD-10-CM

## 2017-03-18 DIAGNOSIS — Z9841 Cataract extraction status, right eye: Secondary | ICD-10-CM | POA: Diagnosis not present

## 2017-03-18 DIAGNOSIS — Z8249 Family history of ischemic heart disease and other diseases of the circulatory system: Secondary | ICD-10-CM

## 2017-03-18 DIAGNOSIS — I6389 Other cerebral infarction: Secondary | ICD-10-CM | POA: Diagnosis not present

## 2017-03-18 DIAGNOSIS — R29818 Other symptoms and signs involving the nervous system: Secondary | ICD-10-CM | POA: Diagnosis not present

## 2017-03-18 HISTORY — DX: Family history of other specified conditions: Z84.89

## 2017-03-18 LAB — CBC
HEMATOCRIT: 47.1 % — AB (ref 36.0–46.0)
Hemoglobin: 15.2 g/dL — ABNORMAL HIGH (ref 12.0–15.0)
MCH: 31.8 pg (ref 26.0–34.0)
MCHC: 32.3 g/dL (ref 30.0–36.0)
MCV: 98.5 fL (ref 78.0–100.0)
PLATELETS: 180 10*3/uL (ref 150–400)
RBC: 4.78 MIL/uL (ref 3.87–5.11)
RDW: 13.4 % (ref 11.5–15.5)
WBC: 5 10*3/uL (ref 4.0–10.5)

## 2017-03-18 LAB — DIFFERENTIAL
BASOS ABS: 0 10*3/uL (ref 0.0–0.1)
BASOS PCT: 0 %
EOS ABS: 0.1 10*3/uL (ref 0.0–0.7)
Eosinophils Relative: 1 %
Lymphocytes Relative: 42 %
Lymphs Abs: 2.1 10*3/uL (ref 0.7–4.0)
Monocytes Absolute: 0.4 10*3/uL (ref 0.1–1.0)
Monocytes Relative: 7 %
NEUTROS ABS: 2.5 10*3/uL (ref 1.7–7.7)
NEUTROS PCT: 50 %

## 2017-03-18 LAB — COMPREHENSIVE METABOLIC PANEL
ALBUMIN: 4 g/dL (ref 3.5–5.0)
ALT: 24 U/L (ref 14–54)
AST: 33 U/L (ref 15–41)
Alkaline Phosphatase: 105 U/L (ref 38–126)
Anion gap: 13 (ref 5–15)
BUN: 16 mg/dL (ref 6–20)
CHLORIDE: 101 mmol/L (ref 101–111)
CO2: 24 mmol/L (ref 22–32)
CREATININE: 0.74 mg/dL (ref 0.44–1.00)
Calcium: 9.2 mg/dL (ref 8.9–10.3)
GFR calc Af Amer: >60 mL/min (ref >60)
GFR calc non Af Amer: >60 mL/min (ref >60)
Glucose, Bld: 165 mg/dL — ABNORMAL HIGH (ref 65–99)
POTASSIUM: 3.7 mmol/L (ref 3.5–5.1)
SODIUM: 138 mmol/L (ref 135–145)
Total Bilirubin: 0.5 mg/dL (ref 0.3–1.2)
Total Protein: 7.1 g/dL (ref 6.5–8.1)

## 2017-03-18 LAB — I-STAT TROPONIN, ED: Troponin i, poc: 0 ng/mL (ref 0.00–0.08)

## 2017-03-18 LAB — I-STAT CHEM 8, ED
BUN: 23 mg/dL — ABNORMAL HIGH (ref 6–20)
CHLORIDE: 99 mmol/L — AB (ref 101–111)
CREATININE: 0.8 mg/dL (ref 0.44–1.00)
Calcium, Ion: 1.14 mmol/L — ABNORMAL LOW (ref 1.15–1.40)
GLUCOSE: 166 mg/dL — AB (ref 65–99)
HEMATOCRIT: 48 % — AB (ref 36.0–46.0)
HEMOGLOBIN: 16.3 g/dL — AB (ref 12.0–15.0)
POTASSIUM: 4 mmol/L (ref 3.5–5.1)
Sodium: 137 mmol/L (ref 135–145)
TCO2: 31 mmol/L (ref 22–32)

## 2017-03-18 LAB — APTT: APTT: 26 s (ref 24–36)

## 2017-03-18 LAB — PROTIME-INR
INR: 0.93
PROTHROMBIN TIME: 12.4 s (ref 11.4–15.2)

## 2017-03-18 MED ORDER — ENOXAPARIN SODIUM 40 MG/0.4ML ~~LOC~~ SOLN
40.0000 mg | SUBCUTANEOUS | Status: DC
  Administered 2017-03-18 – 2017-03-23 (×6): 40 mg via SUBCUTANEOUS
  Filled 2017-03-18 (×6): qty 0.4

## 2017-03-18 MED ORDER — ACETAMINOPHEN 325 MG PO TABS
650.0000 mg | ORAL_TABLET | Freq: Once | ORAL | Status: AC
  Administered 2017-03-18: 650 mg via ORAL
  Filled 2017-03-18: qty 2

## 2017-03-18 MED ORDER — OCUVITE-LUTEIN PO CAPS
1.0000 | ORAL_CAPSULE | Freq: Every day | ORAL | Status: DC
  Filled 2017-03-18: qty 1

## 2017-03-18 MED ORDER — PREDNISOLONE ACETATE 1 % OP SUSP
1.0000 [drp] | Freq: Every day | OPHTHALMIC | Status: DC
  Administered 2017-03-18 – 2017-03-23 (×6): 1 [drp] via OPHTHALMIC
  Filled 2017-03-18: qty 1

## 2017-03-18 MED ORDER — ACETAMINOPHEN 160 MG/5ML PO SOLN: 650.0000 mg | ORAL | Status: DC | PRN

## 2017-03-18 MED ORDER — LABETALOL HCL 5 MG/ML IV SOLN: 10.0000 mg | Freq: Three times a day (TID) | INTRAVENOUS | Status: DC | PRN

## 2017-03-18 MED ORDER — CLOPIDOGREL BISULFATE 75 MG PO TABS
75.0000 mg | ORAL_TABLET | Freq: Every day | ORAL | Status: DC
Start: 2017-03-19 — End: 2017-03-24
  Administered 2017-03-19 – 2017-03-24 (×6): 75 mg via ORAL
  Filled 2017-03-18 (×6): qty 1

## 2017-03-18 MED ORDER — ACETAMINOPHEN 325 MG PO TABS
650.0000 mg | ORAL_TABLET | ORAL | Status: DC | PRN
  Administered 2017-03-18 – 2017-03-23 (×5): 650 mg via ORAL
  Filled 2017-03-18 (×5): qty 2

## 2017-03-18 MED ORDER — STROKE: EARLY STAGES OF RECOVERY BOOK
Freq: Once | Status: AC
Start: 2017-03-18 — End: 2017-03-18
  Administered 2017-03-18
  Filled 2017-03-18: qty 1

## 2017-03-18 MED ORDER — IOPAMIDOL (ISOVUE-370) INJECTION 76%
INTRAVENOUS | Status: AC
  Filled 2017-03-18: qty 50

## 2017-03-18 MED ORDER — ACETAMINOPHEN 650 MG RE SUPP: 650.0000 mg | RECTAL | Status: DC | PRN

## 2017-03-18 MED ORDER — MELATONIN 1 MG SL SUBL: 1.0000 mg | SUBLINGUAL_TABLET | Freq: Every day | SUBLINGUAL | Status: DC

## 2017-03-18 MED ORDER — ARTIFICIAL TEARS OPHTHALMIC OINT
1.0000 "application " | TOPICAL_OINTMENT | Freq: Every day | OPHTHALMIC | Status: DC
  Administered 2017-03-18 – 2017-03-23 (×6): 1 via OPHTHALMIC
  Filled 2017-03-18: qty 3.5

## 2017-03-18 MED ORDER — POLYVINYL ALCOHOL 1.4 % OP SOLN
1.0000 [drp] | Freq: Two times a day (BID) | OPHTHALMIC | Status: DC | PRN
  Administered 2017-03-18 – 2017-03-21 (×4): 1 [drp] via OPHTHALMIC
  Filled 2017-03-18: qty 15

## 2017-03-18 MED ORDER — ATORVASTATIN CALCIUM 40 MG PO TABS
40.0000 mg | ORAL_TABLET | Freq: Every day | ORAL | Status: DC
  Administered 2017-03-19 – 2017-03-23 (×4): 40 mg via ORAL
  Filled 2017-03-18 (×5): qty 1

## 2017-03-18 NOTE — Consult Note (Addendum)
Neurology Consultation  Reason for Consult: Code stroke Referring Physician: Dr. Laverta Baltimore  CC: Sudden vision loss in the right eye that has resolved, left facial numbness  History is obtained from: Patient, daughter, son, chart  HPI: [name] [name] is a 83 y.o. female who has a past medical history of a prior stroke with no residual deficits, SVT-loop recorder in place for concern of paroxysmal A. fib since her last stroke in 2015 but no documented A. fib, blindness of the left eye, hypertension, who was in her usual state of health until about 3:15 PM, when her daughter came to help her sit and she noticed that she cannot see out of the right eye, which is her good eye and has limited vision from.  She also noted that her left lip both upper lip and lower lip had become numb.  At that time, she also reported that both are ears felt very full.  She was brought in to the hospital for concern of stroke because the daughter also appreciated a mild left facial asymmetry. She has had one stroke in the past that had affected her speech.  She had a left MCA territory stroke.  Workup was done and was essentially unremarkable but there was high suspicion for paroxysmal atrial fibrillation, for which she was implanted with a implantable loop recorder-no documented A. fib L date. Also of note, upon her arrival and since this morning, her systolic blood pressures have been in the 200s.  She took an amlodipine and 1 dose of nitroglycerin at home as advised by her cardiologist for these high blood pressures.  Her usual blood pressure ranges in the 140 range. She denies any speech problems.  Denies headaches.  Denies any other tingling and numbness other than described above.  Denies nausea and vomiting although she was a little nauseous as I was interviewing her. She denies any preceding fevers or chills.  Denies chest pain or shortness of breath.  Denies nausea vomiting.  Denies abdominal pain.  LKW: 1515 hrs,  03/18/2017 tpa given?: no, mild symptoms Premorbid modified Rankin scale (mRS): 3   ROS: A 14 point ROS was performed and is negative except as noted in the HPI.    Past Medical History:  Diagnosis Date  . Arthritis    "thumbs, joints" (08/07/2015)  . Basal cell carcinoma    "several; scattered over my face, hands, leg some cut off; some burned off"  . Blind left eye    a. Corneal transplant x3 with rejection  . CAD (coronary artery disease)    a. s/p NSTEMI 2015 treated conservatively; nuclear stress test low risk. b. Continued angina despite med rx 07/2015 - s/p io [facility] Stent to ramus intermedius with mod LAD stenosis neg by FFR.  Marland Kitchen Chronic right hip pain   . Complication of anesthesia    "very sensitive to RX"  . Diverticulitis large intestine   . Diverticulosis   . Gastritis   . GERD (gastroesophageal reflux disease)   . Hayfever   . Helicobacter pylori (H. pylori) 05/22/02   RUT-Positive  . Hypertension   . Myocardial infarction ([place]) 2015  . Pre-diabetes   . Squamous carcinoma    "several; scattered over my face, hands, leg some cut off; some burned off"  . Stroke [facility]) 2015   denies residual on 08/07/2015  . SVT (supraventricular tachycardia) (HCC)    a. seen by Dr. Caryl Comes - has loop recorder in. Patient has declined amiodarone due to side effect profile  .  Varicose veins     Family History  Problem Relation Age of Onset  . Stroke Mother   . Heart attack Father   . Heart attack Brother   . Cancer Brother   . Heart attack Brother   . Heart attack Brother   . Heart attack Brother   . Parkinsonism Brother     Social History:   reports that  has never smoked. she has never used smokeless tobacco. She reports that she does not drink alcohol or use drugs.  Medications  Current Facility-Administered Medications:  .  iopamidol (ISOVUE-370) 76 % injection, , , ,   Current Outpatient Medications:  .  amLODipine (NORVASC) 2.5 MG tablet, Take 1 tablet (2.5 mg  total) by mouth daily as needed (BLOOD PRESSURE). Only take if greater than 140, Disp: 90 tablet, Rfl: 3 .  clopidogrel (PLAVIX) 75 MG tablet, take 1 tablet by mouth once daily, Disp: 90 tablet, Rfl: 3 .  hyoscyamine (LEVSIN, ANASPAZ) 0.125 MG tablet, Take 1 tablet (0.125 mg total) by mouth every 4 (four) hours as needed for cramping., Disp: 30 tablet, Rfl: 4 .  Melatonin 1 MG SUBL, Place 1 mg under the tongue at bedtime., Disp: , Rfl:  .  Multiple Vitamin (MULTI VITAMIN DAILY PO), Take 1 tablet by mouth every morning., Disp: , Rfl:  .  nebivolol (BYSTOLIC) 5 MG tablet, Take one tablet by mouth daily.  If SBP is greater than 180 you can take an additional 5mg  daily as needed, Disp: 60 tablet, Rfl: 6 .  nitroGLYCERIN (NITROSTAT) 0.4 MG SL tablet, Place 1 tablet (0.4 mg total) under the tongue every 5 (five) minutes as needed for chest pain., Disp: 25 tablet, Rfl: 2 .  Polyethyl Glycol-Propyl Glycol (SYSTANE ULTRA) 0.4-0.3 % SOLN, Apply 1 drop to eye 2 (two) times daily. , Disp: , Rfl:  .  prednisoLONE acetate (PRED FORTE) 1 % ophthalmic suspension, Place 1 drop into both eyes 2 (two) times daily. , Disp: , Rfl:  .  ranitidine (ZANTAC) 150 MG tablet, Take 1 tablet (150 mg total) by mouth at bedtime., Disp: 90 tablet, Rfl: 3   Exam: Current vital signs: BP (!) 183/86   Pulse 88   Resp 16   SpO2 97%  Vital signs in last 24 hours: Pulse Rate:  [88] 88 (12/27 1611) Resp:  [16] 16 (12/27 1611) BP: (183)/(86) 183/86 (12/27 1608) SpO2:  [97 %] 97 % (12/27 1611) General: Awake alert oriented x3 no acute distress HEENT: Normocephalic atraumatic, dry mucous membranes, no lymph adenopathy Lungs clear to auscultation Cardiovascular: S1-S2 heard, regular rate rhythm no murmur rub gallop Abdomen soft nondistended nontender Extremities warm, 1+ pedal edema Neurological exam Mental status: Awake alert oriented x3. Language: Speech is clear.  Naming, fluency, repetition and comprehension intact. Cranial  nerves: Left corneal opacity, right pupil 3 mm and very sluggishly reactive, visual acuity limited to finger counting, able to count fingers in all 4 quadrants with the right eye, subtle drooping of the left angle of the mouth, subjective numbness of the left upper and lower lip, auditory acuity grossly reduced bilaterally, palate elevates at midline, shoulder shrug intact, tongue midline. Motor exam: 5/5 in all 4 extremities. Tone: is normal and bulk is normal Sensation- Intact to light touch on all 4 limbs.  Mild decreased sensation on the left upper and lower lip. Coordination: FTN intact bilaterally Gait- deferred NIHSS - 2 (1 for sensory, 1 for facial)   Labs I have reviewed labs in epic  and the results pertinent to this consultation are: CBC    Component Value Date/Time   WBC 5.0 03/18/2017 1545   RBC 4.78 03/18/2017 1545   HGB 16.3 (H) 03/18/2017 1550   HCT 48.0 (H) 03/18/2017 1550   PLT 180 03/18/2017 1545   MCV 98.5 03/18/2017 1545   MCH 31.8 03/18/2017 1545   MCHC 32.3 03/18/2017 1545   RDW 13.4 03/18/2017 1545   LYMPHSABS 2.1 03/18/2017 1545   MONOABS 0.4 03/18/2017 1545   EOSABS 0.1 03/18/2017 1545   BASOSABS 0.0 03/18/2017 1545  CMP     Component Value Date/Time   NA 137 03/18/2017 1550   K 4.0 03/18/2017 1550   CL 99 (L) 03/18/2017 1550   CO2 24 03/18/2017 1545   GLUCOSE 166 (H) 03/18/2017 1550   BUN 23 (H) 03/18/2017 1550   CREATININE 0.80 03/18/2017 1550   CALCIUM 9.2 03/18/2017 1545   PROT 7.1 03/18/2017 1545   ALBUMIN 4.0 03/18/2017 1545   AST 33 03/18/2017 1545   ALT 24 03/18/2017 1545   ALKPHOS 105 03/18/2017 1545   BILITOT 0.5 03/18/2017 1545   GFRNONAA >60 03/18/2017 1545   GFRAA >60 03/18/2017 1545    Imaging I have reviewed the images obtained: CT-scan of the brain -no acute changes. CTA head and neck with possible right P1 occlusion/thrombus.  All other vessels patent with no LVO.  Assessment:  83 year old woman with a past medical  history of prior stroke with no residual deficits, SVT with a loop recorder in place for concern of paroxysmal A. fib but no documentation of A. fib since her last stroke in 2015, blindness of the left eye, and hypertension who was brought in for concern of stroke when she started having sudden onset of right eye blindness which has since resolved and left facial numbness and weakness. She has a low stroke scale of 2, hence TPA was not offered. She has no LVO that explains her symptoms, hence not a candidate for endovascular.  Her symptoms are likely related to a stroke/TIA versus hypertensive urgency as she came in with systolic blood pressure above 200s.  Impression: Evaluate for stroke/TIA Evaluate for hypertensive urgency   Recommendations: -Admit to hospitalist -Telemetry monitoring -Allow for permissive hypertension for the first 24-48h - only treat PRN if SBP >220 mmHg. Blood pressures can be gradually normalized to SBP<140 upon discharge.  Given that her symptoms are still persistent-facial droop and numbness, I would treat this as a stroke and not as a hypertensive urgency in the acute setting and let the pressures be high as above. -MRI of the brain without contrast -Echocardiogram -HgbA1c, fasting lipid panel -Frequent neuro checks -From a stroke standpoint, needs to be on one antiplatelet.  Currently on Plavix but if this is a new stroke, can consider this as a Plavix failure and switch to aspirin.  She is a patient of Dr. Burt Knack in cardiology.  Primary team can touch base with him and see if he has preference of one over the other antiplatelet from a cardiac standpoint. -Atorvastatin 80 mg PO daily -Risk factor modification -PT consult, OT consult, Speech consult  Please page stroke NP/PA/MD (listed on AMION)  from 8am-4 pm as this patient will be followed by the stroke team at this point.  -- Amie Portland, MD [facility] [phone] If 7pm to 7am, please call  on call as listed on AMION.  CRITICAL CARE Performed by: Amie Portland Total critical care time: 40 minutes critical care time was  exclusive of separately billable procedures and treating other patients. Critical care was necessary to treat or prevent imminent or life-threatening deterioration. Critical care was time spent personally by me on the following activities: development of treatment plan with patient and/or surrogate as well as nursing, discussions with consultants, evaluation of patient's response to treatment, examination of patient, obtaining history from patient or surrogate, ordering and performing treatments and interventions, ordering and review of laboratory studies, ordering and review of radiographic studies, pulse oximetry and re-evaluation of patient's condition.

## 2017-03-18 NOTE — ED Notes (Signed)
Neurologist at bedside. 

## 2017-03-18 NOTE — ED Notes (Signed)
Patient transported to CT by this RN 

## 2017-03-18 NOTE — Telephone Encounter (Signed)
Patient's daughter calling and states that her mother's BP is elevated at 224/91. She states that her face feels numb, she feels nauseous, and that everything has went black in front of her. She states that her hands are shaky and weak. Daughter states that she has helped her mother to sit down and that she is not sure if she is having a stroke and wants Dr. Burt Knack to see her today instead of tomorrow. Made her aware that Dr. Burt Knack is not here to see her today and given the patient's symptoms she needs to be evaluated in the ED. Daughter verbalized understanding and thanked me for the call.

## 2017-03-18 NOTE — ED Notes (Signed)
[facility] in MRI advised they will be over soon to transport patient to scan.

## 2017-03-18 NOTE — ED Provider Notes (Signed)
Emergency Department Provider Note   I have reviewed the triage vital signs and the nursing notes.   HISTORY  Chief Complaint No chief complaint on file.   HPI [name] [name] is a 83 y.o. female with PMH of GERD, CAD, CVA, HTN, Blindness left eye (total) and shape vision on the right edition of sudden onset right eye vision loss in the setting of elevated blood pressure and associated left lip and face numbness.  Patient was at home when she suddenly noticed the vision loss.  Family took her blood pressure because she was not feeling well and found it to be greater than 180.  She took her amlodipine which was prescribed to take as needed for elevated blood pressure.  Patient also noticed some left face and lip numbness.  No numbness or weakness in the arms or legs.  Patient's last seen normal was 30 minutes prior to ED presentation.  She does have history of stroke and has been compliant with her medications including Plavix.  Patient seems to be improving slightly but is not returned to baseline.   Past Medical History:  Diagnosis Date  . Arthritis    "thumbs, joints" (08/07/2015)  . Basal cell carcinoma    "several; scattered over my face, hands, leg some cut off; some burned off"  . Blind left eye    a. Corneal transplant x3 with rejection  . CAD (coronary artery disease)    a. s/p NSTEMI 2015 treated conservatively; nuclear stress test low risk. b. Continued angina despite med rx 07/2015 - s/p io [place] Stent to ramus intermedius with mod LAD stenosis neg by FFR.  Marland Kitchen Chronic right hip pain   . Complication of anesthesia    "very sensitive to RX"  . Diverticulitis large intestine   . Diverticulosis   . Gastritis   . GERD (gastroesophageal reflux disease)   . Hayfever   . Helicobacter pylori (H. pylori) 05/22/02   RUT-Positive  . Hypertension   . Myocardial infarction ([place]) 2015  . Pre-diabetes   . Squamous carcinoma    "several; scattered over my face, hands, leg some cut  off; some burned off"  . Stroke [facility]) 2015   denies residual on 08/07/2015  . SVT (supraventricular tachycardia) (HCC)    a. seen by Dr. Caryl Comes - has loop recorder in. Patient has declined amiodarone due to side effect profile  . Varicose veins     Patient Active Problem List   Diagnosis Date Noted  . Stroke ([place]) 03/18/2017  . Other forms of angina pectoris ([place]) 08/07/2015  . Pre-diabetes   . Blind left eye- (Fuch's disease) 03/05/2015  . Diverticulitis 12/19/2014  . UTI (lower urinary tract infection) 12/19/2014  . Essential hypertension 12/19/2014  . CAD (coronary artery disease) 12/19/2014  . Diverticulitis of large intestine without perforation or abscess without bleeding   . History of Rt brain stroke Oct 2015 06/14/2014  . Headache 06/14/2014  . Malignant hypertension 01/20/2014  . Hypertensive urgency, malignant 01/20/2014  . NSTEMI Nov 2015- conservative Rx 01/20/2014  . Chest pain with moderate risk of acute coronary syndrome 01/19/2014  . Aphasia 01/04/2014  . Paresthesia 01/04/2014  . Acute CVA (cerebrovascular accident) ([place]) 01/04/2014  . LLQ abdominal pain 01/01/2014  . Generalized abdominal pain 01/01/2014  . History of diverticulitis 01/01/2014  . Diverticulitis of colon without hemorrhage 12/07/2013  . Diaphoresis 12/29/2012  . Anemia 12/28/2012  . Sepsis ([place]) 12/24/2012  . Cellulitis of leg, right 12/24/2012  . Pyuria 12/24/2012  .  Hypoxemia 12/24/2012  . Acute on chronic diastolic heart failure ([place]) 12/24/2012  . Postoperative anemia due to acute blood loss 12/15/2012  . Sleep disturbance 10/03/2012  . Abdominal pulsatile mass 08/24/2012  . Depression 07/12/2012  . OA (osteoarthritis) of knee 06/27/2012  . History of PSVT 12/15/2011    Past Surgical History:  Procedure Laterality Date  . BASAL CELL CARCINOMA EXCISION    . CARDIAC CATHETERIZATION N/A 08/07/2015   Procedure: Left Heart Cath and Coronary Angiography;  Surgeon: [name], MD;   Location: [facility];  Service: Cardiovascular;  Laterality: N/A;  . CARDIAC CATHETERIZATION N/A 08/07/2015   Procedure: Coronary Stent Intervention;  Surgeon: [name], MD;  Location: [facility];  Service: Cardiovascular;  Laterality: N/A;  . CARDIAC CATHETERIZATION N/A 08/07/2015   Procedure: Intravascular Pressure Wire/FFR Study;  Surgeon: [name], MD;  Location: [facility];  Service: Cardiovascular;  Laterality: N/A;  . CATARACT EXTRACTION W/ INTRAOCULAR LENS  IMPLANT, BILATERAL Bilateral 2005  . CLOSED REDUCTION SHOULDER DISLOCATION Left 2016 X 2  . CORNEAL TRANSPLANT  right 2009, left 2009   both eyes, 3 in left eye, 1 in right eye  . CORONARY ANGIOPLASTY WITH STENT PLACEMENT  08/07/2015   "1 stent"  . EYE SURGERY    . JOINT REPLACEMENT    . [place]   "had gangrene in it"  . LOOP RECORDER IMPLANT N/A 01/05/2014   Procedure: LOOP RECORDER IMPLANT;  Surgeon: Coralyn Mark, MD;  Location: [facility];  Service: Cardiovascular;  Laterality: N/A;  . SQUAMOUS CELL CARCINOMA EXCISION    . TONSILLECTOMY    . TOTAL ABDOMINAL HYSTERECTOMY  11/1983   "ovaries and all"  . TOTAL KNEE ARTHROPLASTY Left 06/27/2012   Procedure: LEFT TOTAL KNEE ARTHROPLASTY;  Surgeon: Gearlean Alf, MD;  Location: [facility];  Service: Orthopedics;  Laterality: Left;  . TOTAL KNEE ARTHROPLASTY Right 12/12/2012   Procedure: RIGHT TOTAL KNEE ARTHROPLASTY;  Surgeon: Gearlean Alf, MD;  Location: [facility];  Service: Orthopedics;  Laterality: Right;  . TUBAL LIGATION  1966    Current Outpatient Rx  . Order #: [id number] Class: Normal  . Order #: [id number] Class: Normal  . Order #: [id number] Class: Normal  . Order #: [id number] Class: Historical Med  . Order #: [id number] Class: Historical Med  . Order #: [id number] Class: Historical Med  . Order #: [id number] Class: Historical Med  . Order #: [id number] Class: Normal  . Order #: [id number] Class: Normal  . Order #:  [id number] Class: Historical Med  . Order #: [id number] Class: Historical Med  . Order #: [id number] Class: Historical Med  . Order #: [id number] Class: Normal    Allergies Contrast media [iodinated diagnostic agents]; Fluorescein; Other; Sulfa antibiotics; Ultram [tramadol]; Buprenex [buprenorphine hcl]; Buprenorphine hcl; Keflex [cephalexin]; Levofloxacin; Oxycodone-acetaminophen; Yellow dye; Augmentin [amoxicillin-pot clavulanate]; Hydralazine hcl; Levofloxacin; Percocet [oxycodone-acetaminophen]; Tape; and Zetia [ezetimibe]  Family History  Problem Relation Age of Onset  . Stroke Mother   . Heart attack Father   . Heart attack Brother   . Cancer Brother   . Heart attack Brother   . Heart attack Brother   . Heart attack Brother   . Parkinsonism Brother     Social History Social History   Tobacco Use  . Smoking status: Never Smoker  . Smokeless tobacco: Never Used  Substance Use Topics  . Alcohol use: No  . Drug use: No    Review of Systems  Constitutional: No fever/chills Eyes: Positive right eye vision loss.  ENT:  No sore throat. Cardiovascular: Denies chest pain. Respiratory: Denies shortness of breath. Gastrointestinal: No abdominal pain.  No nausea, no vomiting.  No diarrhea.  No constipation. Genitourinary: Negative for dysuria. Musculoskeletal: Negative for back pain. Skin: Negative for rash. Neurological: Negative for headaches, focal weakness. Positive left face numbness.   10-point ROS otherwise negative.  ____________________________________________   PHYSICAL EXAM:  VITAL SIGNS: Vitals:   03/18/17 1745 03/18/17 1800  BP:  (!) 194/87  Pulse: 81 81  Resp: 14 17  Temp:    SpO2:  98%    Constitutional: Alert and oriented. Well appearing and in no acute distress. Eyes: Conjunctivae are normal. Opacification of the left eye with baseline total vision loss.  Head: Atraumatic. Nose: No congestion/rhinnorhea. Mouth/Throat: Mucous membranes are moist.   Oropharynx non-erythematous. Neck: No stridor.  Cardiovascular: Normal rate, regular rhythm. Good peripheral circulation. Grossly normal heart sounds.   Respiratory: Normal respiratory effort.  No retractions. Lungs CTAB. Gastrointestinal: Soft and nontender. No distention.  Musculoskeletal: No lower extremity tenderness nor edema. No gross deformities of extremities. Neurologic:  Normal speech and language. Normal strength and sensation in the upper and lower extremities. Decreased vision in the right eye globally.  Skin:  Skin is warm, dry and intact. No rash noted.  ____________________________________________   LABS (all labs ordered are listed, but only abnormal results are displayed)  Labs Reviewed  CBC - Abnormal; Notable for the following components:      Result Value   Hemoglobin 15.2 (*)    HCT 47.1 (*)    All other components within normal limits  COMPREHENSIVE METABOLIC PANEL - Abnormal; Notable for the following components:   Glucose, Bld 165 (*)    All other components within normal limits  I-STAT CHEM 8, ED - Abnormal; Notable for the following components:   Chloride 99 (*)    BUN 23 (*)    Glucose, Bld 166 (*)    Calcium, Ion 1.14 (*)    Hemoglobin 16.3 (*)    HCT 48.0 (*)    All other components within normal limits  PROTIME-INR  APTT  DIFFERENTIAL  I-STAT TROPONIN, ED  CBG MONITORING, ED   ____________________________________________  EKG   EKG Interpretation  Date/Time:  Thursday March 18 2017 16:36:28 EST Ventricular Rate:  83 PR Interval:    QRS Duration: 99 QT Interval:  401 QTC Calculation: 472 R Axis:   73 Text Interpretation:  Sinus rhythm Borderline prolonged PR interval Consider left atrial enlargement Minimal ST elevation, anterior leads Baseline wander in lead(s) V3 No STEMI.  Confirmed by Nanda Quinton [id number]) on 03/18/2017 4:41:21 PM       ____________________________________________  RADIOLOGY  Ct Angio Head W Or Wo  Contrast  Result Date: 03/18/2017 CLINICAL DATA:  Left-sided weakness and facial droop. EXAM: CT ANGIOGRAPHY HEAD AND NECK TECHNIQUE: Multidetector CT imaging of the head and neck was performed using the standard protocol during bolus administration of intravenous contrast. Multiplanar CT image reconstructions and MIPs were obtained to evaluate the vascular anatomy. Carotid stenosis measurements (when applicable) are obtained utilizing NASCET criteria, using the distal internal carotid diameter as the denominator. CONTRAST:  50 mL Isovue 370 COMPARISON:  Head CT today. Head MRI/ MRA 01/04/2014. Soft tissue neck CT 10/12/2007. FINDINGS: CTA NECK FINDINGS Aortic arch: Standard 3 vessel aortic arch with mild atherosclerotic plaque. Widely patent brachiocephalic and subclavian arteries. Tortuous subclavian arteries. Right carotid system: Widely patent without evidence of stenosis or dissection. Small amount of calcified plaque at the carotid bifurcation  and in the distal cervical ICA. Tortuous proximal common carotid artery. Left carotid system: Patent without evidence of stenosis or dissection. Tortuous common and internal carotid arteries. Partially retropharyngeal course of the ICA. Vertebral arteries: Patent without evidence of stenosis or dissection. Dominant left vertebral artery. Skeleton: Moderate cervical disc degeneration. Other neck: No mass or lymph node enlargement. Upper chest: Partially visualize mosaic attenuation in the lung apices which could reflect small vessel or airway disease. Review of the MIP images confirms the above findings CTA HEAD FINDINGS Anterior circulation: The internal carotid arteries are patent from skullbase to carotid termini. There is mild siphon atherosclerosis bilaterally without stenosis. ACAs and MCAs are patent with mild atherosclerotic irregularity but no significant proximal stenosis or proximal branch occlusion. No aneurysm. Posterior circulation: The intracranial  vertebral arteries are widely patent to the basilar. The right PICA and left AICA are dominant. Patent SCA origins are seen bilaterally. The basilar artery is widely patent. There is new occlusion of the right PCA at its origin over a length of 4 mm with reconstitution of the more distal P1 segment and beyond. The left PCA is patent without significant proximal stenosis, however there is a new moderate distal left P2 stenosis. Distal PCA branch vessel irregularity and attenuation are noted bilaterally. No aneurysm. Venous sinuses: Patent. Anatomic variants: None. Delayed phase: Not performed. Review of the MIP images confirms the above findings IMPRESSION: 1. New short segment occlusion of the right PCA at its origin. 2. New severe distal left P2 stenosis. 3. No large vessel occlusion or significant proximal stenosis in the anterior circulation. 4. Widely patent cervical carotid and vertebral arteries. 5.  Aortic Atherosclerosis (ICD10-I70.0). These results were called by telephone at the time of interpretation on 03/18/2017 at 4:22 pm to Dr. Rory Percy, who verbally acknowledged these results. Electronically Signed   By: Logan Bores M.D.   On: 03/18/2017 16:38   Ct Angio Neck W Or Wo Contrast  Result Date: 03/18/2017 CLINICAL DATA:  Left-sided weakness and facial droop. EXAM: CT ANGIOGRAPHY HEAD AND NECK TECHNIQUE: Multidetector CT imaging of the head and neck was performed using the standard protocol during bolus administration of intravenous contrast. Multiplanar CT image reconstructions and MIPs were obtained to evaluate the vascular anatomy. Carotid stenosis measurements (when applicable) are obtained utilizing NASCET criteria, using the distal internal carotid diameter as the denominator. CONTRAST:  50 mL Isovue 370 COMPARISON:  Head CT today. Head MRI/ MRA 01/04/2014. Soft tissue neck CT 10/12/2007. FINDINGS: CTA NECK FINDINGS Aortic arch: Standard 3 vessel aortic arch with mild atherosclerotic plaque.  Widely patent brachiocephalic and subclavian arteries. Tortuous subclavian arteries. Right carotid system: Widely patent without evidence of stenosis or dissection. Small amount of calcified plaque at the carotid bifurcation and in the distal cervical ICA. Tortuous proximal common carotid artery. Left carotid system: Patent without evidence of stenosis or dissection. Tortuous common and internal carotid arteries. Partially retropharyngeal course of the ICA. Vertebral arteries: Patent without evidence of stenosis or dissection. Dominant left vertebral artery. Skeleton: Moderate cervical disc degeneration. Other neck: No mass or lymph node enlargement. Upper chest: Partially visualize mosaic attenuation in the lung apices which could reflect small vessel or airway disease. Review of the MIP images confirms the above findings CTA HEAD FINDINGS Anterior circulation: The internal carotid arteries are patent from skullbase to carotid termini. There is mild siphon atherosclerosis bilaterally without stenosis. ACAs and MCAs are patent with mild atherosclerotic irregularity but no significant proximal stenosis or proximal branch occlusion. No aneurysm. Posterior  circulation: The intracranial vertebral arteries are widely patent to the basilar. The right PICA and left AICA are dominant. Patent SCA origins are seen bilaterally. The basilar artery is widely patent. There is new occlusion of the right PCA at its origin over a length of 4 mm with reconstitution of the more distal P1 segment and beyond. The left PCA is patent without significant proximal stenosis, however there is a new moderate distal left P2 stenosis. Distal PCA branch vessel irregularity and attenuation are noted bilaterally. No aneurysm. Venous sinuses: Patent. Anatomic variants: None. Delayed phase: Not performed. Review of the MIP images confirms the above findings IMPRESSION: 1. New short segment occlusion of the right PCA at its origin. 2. New severe  distal left P2 stenosis. 3. No large vessel occlusion or significant proximal stenosis in the anterior circulation. 4. Widely patent cervical carotid and vertebral arteries. 5.  Aortic Atherosclerosis (ICD10-I70.0). These results were called by telephone at the time of interpretation on 03/18/2017 at 4:22 pm to Dr. Rory Percy, who verbally acknowledged these results. Electronically Signed   By: Logan Bores M.D.   On: 03/18/2017 16:38   Ct Head Code Stroke Wo Contrast`  Addendum Date: 03/18/2017   ADDENDUM REPORT: 03/18/2017 16:13 ADDENDUM: These results were called by telephone at the time of interpretation on 03/18/2017 at 4:13 pm to Dr. Rory Percy, who verbally acknowledged these results. Electronically Signed   By: Franchot Gallo M.D.   On: 03/18/2017 16:13   Result Date: 03/18/2017 CLINICAL DATA:  Code stroke.  Left-sided weakness, facial droop EXAM: CT HEAD WITHOUT CONTRAST TECHNIQUE: Contiguous axial images were obtained from the base of the skull through the vertex without intravenous contrast. COMPARISON:  MRI and CT 01/04/2014 FINDINGS: Brain: Mild atrophy. Negative for hydrocephalus. Patchy white matter hyperintensity bilaterally appears chronic and unchanged. Negative for hemorrhage, acute infarct, or mass lesion. Vascular: Negative for hyperdense vessel Skull: Negative Sinuses/Orbits: Negative Other: None ASPECTS ([place] Stroke Program Early CT Score) - Ganglionic level infarction (caudate, lentiform nuclei, internal capsule, insula, M1-M3 cortex): 7 - Supraganglionic infarction (M4-M6 cortex): 3 Total score (0-10 with 10 being normal): 10 IMPRESSION: 1. No acute abnormality. Atrophy and chronic microvascular ischemia in the white matter 2. ASPECTS is 10 Electronically Signed: By: Franchot Gallo M.D. On: 03/18/2017 15:56    ____________________________________________   PROCEDURES  Procedure(s) performed:   Procedures  None ____________________________________________   INITIAL IMPRESSION  / ASSESSMENT AND PLAN / ED COURSE  Pertinent labs & imaging results that were available during my care of the patient were reviewed by me and considered in my medical decision making (see chart for details).  Patient presents to the emergency department with acute onset right vision loss and associated left face numbness.  As I am evaluating the patient she reports some numbness traveling to the right side of the face as well.  Code stroke activated.  Pattern of numbness is not consistent with acute stroke but acute vision loss is concerning. Code Stroke activated and patient is clear for CT scanner from airway perspective.   04:37 PM Spoke with Dr. Malen Gauze after his evaluation. No intervention given low NIH. Plan for admit for embolic w/u. Allow for permissive HTN at this time to SBP 220 but also considering that some of this may represent HTN changes.   Discussed patient's case with Hospitalist to request admission. Patient and family (if present) updated with plan. Care transferred to Hospitalist service.  I reviewed all nursing notes, vitals, pertinent old records, EKGs, labs, imaging (  as available).  ____________________________________________  FINAL CLINICAL IMPRESSION(S) / ED DIAGNOSES  Final diagnoses:  Stroke-like symptoms     MEDICATIONS GIVEN DURING THIS VISIT:  Medications  iopamidol (ISOVUE-370) 76 % injection (not administered)     Note:  This document was prepared using Dragon voice recognition software and may include unintentional dictation errors.  Nanda Quinton, MD Emergency Medicine    Najah Liverman, [facility], MD 03/18/17 [phone]

## 2017-03-18 NOTE — ED Notes (Signed)
ED Provider at bedside. 

## 2017-03-18 NOTE — H&P (Signed)
History and Physical  [name] [name] JKK:[id number] DOB: 10/10/1924 DOA: 03/18/2017  Referring physician: ED PCP: Darcus Austin, MD  Outpatient Specialists: Cardiologist Patient coming from: home & is able to ambulate   Chief Complaint: Vision loss in R eye, lip/L finger numbness   HPI: [name] [name] is a 83 y.o. 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 Daughter also noted a mild L facial droop and also reported PTA, pt SBP has been >200 at home. Pt took an amlodipine and NTG and was brought to the ED. Pt reported associated headache, denied any extremity weakness, N/V/D/C, fever/chills, chest pain, SOB, abdominal pain. Patient admitted for further management   ED Course: In the ED, CT angio head/neck showed new short segment occlusion of the right PCA at its origin. New severe distal left P2 stenosis. Neurology was consulted.  Review of Systems: Review of systems are otherwise negative   Past Medical History:  Diagnosis Date  . Arthritis    "thumbs, joints" (08/07/2015)  . Basal cell carcinoma    "several; scattered over my face, hands, leg some cut off; some burned off"  . Blind left eye    a. Corneal transplant x3 with rejection  . CAD (coronary artery disease)    a. s/p NSTEMI 2015 treated conservatively; nuclear stress test low risk. b. Continued angina despite med rx 07/2015 - s/p io [place] Stent to ramus intermedius with mod LAD stenosis neg by FFR.  Marland Kitchen Chronic right hip pain   . Complication of anesthesia    "very sensitive to RX"  . Diverticulitis large intestine   . Diverticulosis   . Gastritis   . GERD (gastroesophageal reflux disease)   . Hayfever   . Helicobacter pylori (H. pylori) 05/22/02   RUT-Positive  . Hypertension   . Myocardial  infarction ([place]) 2015  . Pre-diabetes   . Squamous carcinoma    "several; scattered over my face, hands, leg some cut off; some burned off"  . Stroke [facility]) 2015   denies residual on 08/07/2015  . SVT (supraventricular tachycardia) (HCC)    a. seen by Dr. Caryl Comes - has loop recorder in. Patient has declined amiodarone due to side effect profile  . Varicose veins    Past Surgical History:  Procedure Laterality Date  . BASAL CELL CARCINOMA EXCISION    . CARDIAC CATHETERIZATION N/A 08/07/2015   Procedure: Left Heart Cath and Coronary Angiography;  Surgeon: [name], MD;  Location: [place] [facility];  Service: Cardiovascular;  Laterality: N/A;  . CARDIAC CATHETERIZATION N/A 08/07/2015   Procedure: Coronary Stent Intervention;  Surgeon: [name], MD;  Location: [place] [facility];  Service: Cardiovascular;  Laterality: N/A;  . CARDIAC CATHETERIZATION N/A 08/07/2015   Procedure: Intravascular Pressure Wire/FFR Study;  Surgeon: [name], MD;  Location: [place] [facility];  Service: Cardiovascular;  Laterality: N/A;  . CATARACT EXTRACTION W/ INTRAOCULAR LENS  IMPLANT, BILATERAL Bilateral 2005  . CLOSED REDUCTION SHOULDER DISLOCATION Left 2016 X 2  . CORNEAL TRANSPLANT  right 2009, left 2009   both eyes, 3 in left eye, 1 in right eye  . CORONARY ANGIOPLASTY WITH STENT PLACEMENT  08/07/2015   "1 stent"  . EYE SURGERY    . JOINT REPLACEMENT    . LAPAROSCOPIC CHOLECYSTECTOMY  1993   "had gangrene in it"  . LOOP RECORDER IMPLANT N/A 01/05/2014   Procedure: LOOP RECORDER IMPLANT;  Surgeon: Coralyn Mark, MD;  Location: [place] [facility];  Service: Cardiovascular;  Laterality: N/A;  . SQUAMOUS CELL CARCINOMA EXCISION    . TONSILLECTOMY    . TOTAL ABDOMINAL HYSTERECTOMY  11/1983   "ovaries and all"  . TOTAL KNEE ARTHROPLASTY Left 06/27/2012   Procedure: LEFT TOTAL KNEE ARTHROPLASTY;  Surgeon: Gearlean Alf, MD;  Location: [facility];  Service: Orthopedics;  Laterality: Left;  . TOTAL KNEE  ARTHROPLASTY Right 12/12/2012   Procedure: RIGHT TOTAL KNEE ARTHROPLASTY;  Surgeon: Gearlean Alf, MD;  Location: [facility];  Service: Orthopedics;  Laterality: Right;  . TUBAL LIGATION  1966    Social History:  reports that  has never smoked. she has never used smokeless tobacco. She reports that she does not drink alcohol or use drugs.   Allergies  Allergen Reactions  . Contrast Media [Iodinated Diagnostic Agents] Anaphylaxis  . Fluorescein Anaphylaxis, Shortness Of Breath, Itching and Swelling    Tongue became swollen  . Other Anaphylaxis, Shortness Of Breath, Swelling and Other (See Comments)    Yellow eye dye that is used to check retina (Fluorescein) Caused swelling of tongue  . Sulfa Antibiotics Rash  . Ultram [Tramadol] Hives  . Buprenex [Buprenorphine Hcl] Palpitations and Other (See Comments)    Heart raced  . Buprenorphine Hcl Other (See Comments)    Impacted blood pressure (high or low??)  . Keflex [Cephalexin] Diarrhea  . Levofloxacin Other (See Comments)    Reaction not recalled  . Oxycodone-Acetaminophen Nausea And Vomiting  . Yellow Dye Itching, Swelling and Other (See Comments)    Fluorescein (in eye drops) - Tongue became swollen  . Augmentin [Amoxicillin-Pot Clavulanate] Nausea And Vomiting  . Hydralazine Hcl Swelling, Palpitations and Rash    Patient developed facial flushing, swelling in legs.  . Levofloxacin Other (See Comments)    Reaction not recalled  . Percocet [Oxycodone-Acetaminophen] Nausea And Vomiting and Other (See Comments)    "Felt drained," also  . Tape Other (See Comments)    Adhesive tape pulls off the skin  . Zetia [Ezetimibe] Other (See Comments)    Muscle weakness    Family History  Problem Relation Age of Onset  . Stroke Mother   . Heart attack Father   . Heart attack Brother   . Cancer Brother   . Heart attack Brother   . Heart attack Brother   . Heart attack Brother   . Parkinsonism Brother       Prior to Admission  medications   Medication Sig Start Date End Date Taking? Authorizing Provider  amLODipine (NORVASC) 2.5 MG tablet Take 1 tablet (2.5 mg total) by mouth daily as needed (BLOOD PRESSURE). Only take if greater than 140 Patient taking differently: Take 2.5 mg by mouth daily as needed (for blood pressure/take only if Systolic reading is greater than 180).  02/25/17  Yes [name], MD  clopidogrel (PLAVIX) 75 MG tablet take 1 tablet by mouth once daily Patient taking differently: Take 75 mg by mouth once a day 07/20/16  Yes [name], MD  hyoscyamine (LEVSIN, ANASPAZ) 0.125 MG tablet Take 1 tablet (0.125 mg total) by mouth every 4 (four) hours as needed for cramping. 08/20/16  Yes Armbruster, Carlota Raspberry, MD  Magnesium 250 MG TABS Take 250 mg by mouth at bedtime.   Yes [provider]  Melatonin 1 MG SUBL Place 1  mg under the tongue at bedtime.   Yes [provider]  Multiple Vitamin (MULTI VITAMIN DAILY PO) Take 1 tablet by mouth every morning.   Yes [provider]  Multiple Vitamins-Minerals (PRESERVISION AREDS 2) CAPS Take 1 capsule by mouth daily after breakfast.   Yes [provider]  nebivolol (BYSTOLIC) 5 MG tablet Take one tablet by mouth daily.  If SBP is greater than 180 you can take an additional 5mg  daily as needed Patient taking differently: Take 5-10 mg by mouth See admin instructions. 5 mg once a day at 6 PM and take an additional 5 mg once a day if needed if Systolic reading is greater than 180 04/29/16  Yes [name], MD  nitroGLYCERIN (NITROSTAT) 0.4 MG SL tablet Place 1 tablet (0.4 mg total) under the tongue every 5 (five) minutes as needed for chest pain. 08/08/15  Yes Simmons, Brittainy M, PA-C  Polyethyl Glycol-Propyl Glycol (SYSTANE ULTRA) 0.4-0.3 % SOLN Apply 1 drop to eye 2 (two) times daily as needed (for irritation).    Yes [provider]  Polyethyl Glycol-Propyl Glycol (SYSTANE) 0.4-0.3 % GEL ophthalmic gel Place 1  application into both eyes at bedtime.   Yes [provider]  prednisoLONE acetate (PRED FORTE) 1 % ophthalmic suspension Place 1 drop into both eyes See admin instructions. 1 drop into both eyes at bedtime spaced 5 minutes apart from Systane gel   Yes [provider]  ranitidine (ZANTAC) 150 MG tablet Take 1 tablet (150 mg total) by mouth at bedtime. 11/06/16  Yes Armbruster, Carlota Raspberry, MD    Physical Exam: BP (!) 153/72 (BP Location: Right Arm)   Pulse 79   Temp 97.8 F (36.6 C) (Oral)   Resp 19   SpO2 96%   General: AAO X 3, NAD  Eyes: L eye blindness, R eye poor vision  ENT: Negative  Neck: Normal Cardiovascular: S1-S2 present, no added hrt sound  Respiratory: Chest clear bilaterally  Abdomen: Soft, non-tender, non-distended, BS present  Skin: Normal Musculoskeletal: No pedal edema, varicose veins noted  Psychiatric: Normal mood  Neurologic: L eye blindness, R eye with poor vision, L facial droop noted, lip numbness, 5/5 in all extremities          Labs on Admission:  Basic Metabolic Panel: Recent Labs  Lab 03/18/17 1545 03/18/17 1550  NA 138 137  K 3.7 4.0  CL 101 99*  CO2 24  --   GLUCOSE 165* 166*  BUN 16 23*  CREATININE 0.74 0.80  CALCIUM 9.2  --    Liver Function Tests: Recent Labs  Lab 03/18/17 1545  AST 33  ALT 24  ALKPHOS 105  BILITOT 0.5  PROT 7.1  ALBUMIN 4.0   No results for input(s): LIPASE, AMYLASE in the last 168 hours. No results for input(s): AMMONIA in the last 168 hours. CBC: Recent Labs  Lab 03/18/17 1545 03/18/17 1550  WBC 5.0  --   NEUTROABS 2.5  --   HGB 15.2* 16.3*  HCT 47.1* 48.0*  MCV 98.5  --   PLT 180  --    Cardiac Enzymes: No results for input(s): CKTOTAL, CKMB, CKMBINDEX, TROPONINI in the last 168 hours.  BNP (last 3 results) No results for input(s): BNP in the last 8760 hours.  ProBNP (last 3 results) No results for input(s): PROBNP in the last 8760 hours.  CBG: No results for input(s):  GLUCAP in the last 168 hours.  Radiological Exams on Admission: Ct Angio Head W  Or Wo Contrast  Result Date: 03/18/2017 CLINICAL DATA:  Left-sided weakness and facial droop. EXAM: CT ANGIOGRAPHY HEAD AND NECK TECHNIQUE: Multidetector CT imaging of the head and neck was performed using the standard protocol during bolus administration of intravenous contrast. Multiplanar CT image reconstructions and MIPs were obtained to evaluate the vascular anatomy. Carotid stenosis measurements (when applicable) are obtained utilizing NASCET criteria, using the distal internal carotid diameter as the denominator. CONTRAST:  50 mL Isovue 370 COMPARISON:  Head CT today. Head MRI/ MRA 01/04/2014. Soft tissue neck CT 10/12/2007. FINDINGS: CTA NECK FINDINGS Aortic arch: Standard 3 vessel aortic arch with mild atherosclerotic plaque. Widely patent brachiocephalic and subclavian arteries. Tortuous subclavian arteries. Right carotid system: Widely patent without evidence of stenosis or dissection. Small amount of calcified plaque at the carotid bifurcation and in the distal cervical ICA. Tortuous proximal common carotid artery. Left carotid system: Patent without evidence of stenosis or dissection. Tortuous common and internal carotid arteries. Partially retropharyngeal course of the ICA. Vertebral arteries: Patent without evidence of stenosis or dissection. Dominant left vertebral artery. Skeleton: Moderate cervical disc degeneration. Other neck: No mass or lymph node enlargement. Upper chest: Partially visualize mosaic attenuation in the lung apices which could reflect small vessel or airway disease. Review of the MIP images confirms the above findings CTA HEAD FINDINGS Anterior circulation: The internal carotid arteries are patent from skullbase to carotid termini. There is mild siphon atherosclerosis bilaterally without stenosis. ACAs and MCAs are patent with mild atherosclerotic irregularity but no significant proximal stenosis  or proximal branch occlusion. No aneurysm. Posterior circulation: The intracranial vertebral arteries are widely patent to the basilar. The right PICA and left AICA are dominant. Patent SCA origins are seen bilaterally. The basilar artery is widely patent. There is new occlusion of the right PCA at its origin over a length of 4 mm with reconstitution of the more distal P1 segment and beyond. The left PCA is patent without significant proximal stenosis, however there is a new moderate distal left P2 stenosis. Distal PCA branch vessel irregularity and attenuation are noted bilaterally. No aneurysm. Venous sinuses: Patent. Anatomic variants: None. Delayed phase: Not performed. Review of the MIP images confirms the above findings IMPRESSION: 1. New short segment occlusion of the right PCA at its origin. 2. New severe distal left P2 stenosis. 3. No large vessel occlusion or significant proximal stenosis in the anterior circulation. 4. Widely patent cervical carotid and vertebral arteries. 5.  Aortic Atherosclerosis (ICD10-I70.0). These results were called by telephone at the time of interpretation on 03/18/2017 at 4:22 pm to Dr. Rory Percy, who verbally acknowledged these results. Electronically Signed   By: Logan Bores M.D.   On: 03/18/2017 16:38   Ct Angio Neck W Or Wo Contrast  Result Date: 03/18/2017 CLINICAL DATA:  Left-sided weakness and facial droop. EXAM: CT ANGIOGRAPHY HEAD AND NECK TECHNIQUE: Multidetector CT imaging of the head and neck was performed using the standard protocol during bolus administration of intravenous contrast. Multiplanar CT image reconstructions and MIPs were obtained to evaluate the vascular anatomy. Carotid stenosis measurements (when applicable) are obtained utilizing NASCET criteria, using the distal internal carotid diameter as the denominator. CONTRAST:  50 mL Isovue 370 COMPARISON:  Head CT today. Head MRI/ MRA 01/04/2014. Soft tissue neck CT 10/12/2007. FINDINGS: CTA NECK FINDINGS  Aortic arch: Standard 3 vessel aortic arch with mild atherosclerotic plaque. Widely patent brachiocephalic and subclavian arteries. Tortuous subclavian arteries. Right carotid system: Widely patent without evidence of stenosis or dissection. Small amount  of calcified plaque at the carotid bifurcation and in the distal cervical ICA. Tortuous proximal common carotid artery. Left carotid system: Patent without evidence of stenosis or dissection. Tortuous common and internal carotid arteries. Partially retropharyngeal course of the ICA. Vertebral arteries: Patent without evidence of stenosis or dissection. Dominant left vertebral artery. Skeleton: Moderate cervical disc degeneration. Other neck: No mass or lymph node enlargement. Upper chest: Partially visualize mosaic attenuation in the lung apices which could reflect small vessel or airway disease. Review of the MIP images confirms the above findings CTA HEAD FINDINGS Anterior circulation: The internal carotid arteries are patent from skullbase to carotid termini. There is mild siphon atherosclerosis bilaterally without stenosis. ACAs and MCAs are patent with mild atherosclerotic irregularity but no significant proximal stenosis or proximal branch occlusion. No aneurysm. Posterior circulation: The intracranial vertebral arteries are widely patent to the basilar. The right PICA and left AICA are dominant. Patent SCA origins are seen bilaterally. The basilar artery is widely patent. There is new occlusion of the right PCA at its origin over a length of 4 mm with reconstitution of the more distal P1 segment and beyond. The left PCA is patent without significant proximal stenosis, however there is a new moderate distal left P2 stenosis. Distal PCA branch vessel irregularity and attenuation are noted bilaterally. No aneurysm. Venous sinuses: Patent. Anatomic variants: None. Delayed phase: Not performed. Review of the MIP images confirms the above findings IMPRESSION: 1.  New short segment occlusion of the right PCA at its origin. 2. New severe distal left P2 stenosis. 3. No large vessel occlusion or significant proximal stenosis in the anterior circulation. 4. Widely patent cervical carotid and vertebral arteries. 5.  Aortic Atherosclerosis (ICD10-I70.0). These results were called by telephone at the time of interpretation on 03/18/2017 at 4:22 pm to Dr. Rory Percy, who verbally acknowledged these results. Electronically Signed   By: Logan Bores M.D.   On: 03/18/2017 16:38   Ct Head Code Stroke Wo Contrast`  Addendum Date: 03/18/2017   ADDENDUM REPORT: 03/18/2017 16:13 ADDENDUM: These results were called by telephone at the time of interpretation on 03/18/2017 at 4:13 pm to Dr. Rory Percy, who verbally acknowledged these results. Electronically Signed   By: Franchot Gallo M.D.   On: 03/18/2017 16:13   Result Date: 03/18/2017 CLINICAL DATA:  Code stroke.  Left-sided weakness, facial droop EXAM: CT HEAD WITHOUT CONTRAST TECHNIQUE: Contiguous axial images were obtained from the base of the skull through the vertex without intravenous contrast. COMPARISON:  MRI and CT 01/04/2014 FINDINGS: Brain: Mild atrophy. Negative for hydrocephalus. Patchy white matter hyperintensity bilaterally appears chronic and unchanged. Negative for hemorrhage, acute infarct, or mass lesion. Vascular: Negative for hyperdense vessel Skull: Negative Sinuses/Orbits: Negative Other: None ASPECTS ([place] Stroke Program Early CT Score) - Ganglionic level infarction (caudate, lentiform nuclei, internal capsule, insula, M1-M3 cortex): 7 - Supraganglionic infarction (M4-M6 cortex): 3 Total score (0-10 with 10 being normal): 10 IMPRESSION: 1. No acute abnormality. Atrophy and chronic microvascular ischemia in the white matter 2. ASPECTS is 10 Electronically Signed: By: Franchot Gallo M.D. On: 03/18/2017 15:56    EKG: Independently reviewed. NSR, no acute ST changes  Assessment/Plan Present on Admission: . Stroke  ([place]) . Essential hypertension . Blind left eye- (Fuch's disease) . Depression . OA (osteoarthritis) of knee  Principal Problem:   Stroke [facility]) Active Problems:   History of PSVT   OA (osteoarthritis) of knee   Depression   History of Rt brain stroke Oct 2015   Essential  hypertension   Blind left eye- (Fuch's disease)  #Acute/early subacute R midbrain  Hx as above, L facial droop, lip numbness MRI showed: Acute/early subacute infarction centered in right anteromedial midbrain with involvement of right cerebral peduncle. No associated hemorrhage or mass effect. CT angio neck/head as mentioned above Stroke protocol: ECHO, lipid panel, A1c Frequent neuro checks PT/OT/SLP Telemetry Neurology on board: rec continuing PTA plavix, start lipitor  #HTN Allow for permissive HTN, SBP>220 Held home BP meds PRN IV labetolol  #Hx of SVT/??Afib Pt has an implantable loop recorder High suspicion of afib, but none detected Not on any AC  #CAD Chest pain free  #GERD Continue Zantac  #Pre-diabetes Repeat A1c pending  #Fuch's disease Blind L eye Continue PTA eye drops   DVT prophylaxis: Lovenox  Code Status: Full  Family Communication: Spoke with daughters   Disposition Plan: Home once stable  Consults called: Neurology  Admission status: Inpatient     Alma Friendly MD [facility]   If 7PM-7AM, please contact night-coverage [email or web address] Password [facility]  03/18/2017, 10:15 PM

## 2017-03-18 NOTE — ED Notes (Signed)
Pt arrives to ER for new onset 30 minutes ago of left sided visual field blackness (pt legally blind but can usually see shapes), left sided facial numbness, with weakness, reuqired assistance when getting into a chair. Code stroke activated, MD Long to meet pt at the bridge. CT informed.

## 2017-03-18 NOTE — ED Notes (Signed)
20G PIV placed at the bridge while MD evaluating the patient. Labs obtained.

## 2017-03-18 NOTE — Telephone Encounter (Signed)
New message   Patient daughter calling with BP concerns. Patient has shaky hands, feels like face is numb, nausea.   Pt c/o BP issue: STAT if pt c/o blurred vision, one-sided weakness or slurred speech  1. What are your last 5 BP readings? 224/91 today  2. Are you having any other symptoms (ex. Dizziness, headache, blurred vision, passed out)? NO  3. What is your BP issue? High BP

## 2017-03-18 NOTE — ED Notes (Signed)
MRI called inquiring if pt will need pre meds prior to scan, made aware pt had advised MRIs in the past with no need for medication. MRI tech stated they should be able to take patient over in 45 mins to an hour.

## 2017-03-18 NOTE — ED Notes (Addendum)
Dr. Rory Percy at bedside, pt c/o tingling in left fingertips. MD aware and advised to keep close eye on patient regarding these symptoms.

## 2017-03-19 ENCOUNTER — Telehealth: Payer: Self-pay | Admitting: Cardiovascular Disease

## 2017-03-19 ENCOUNTER — Inpatient Hospital Stay (HOSPITAL_COMMUNITY): Payer: PPO

## 2017-03-19 ENCOUNTER — Encounter (HOSPITAL_COMMUNITY): Payer: PPO

## 2017-03-19 ENCOUNTER — Other Ambulatory Visit (HOSPITAL_COMMUNITY): Payer: PPO

## 2017-03-19 ENCOUNTER — Ambulatory Visit: Payer: PPO | Admitting: Cardiovascular Disease

## 2017-03-19 DIAGNOSIS — Z8673 Personal history of transient ischemic attack (TIA), and cerebral infarction without residual deficits: Secondary | ICD-10-CM

## 2017-03-19 DIAGNOSIS — E871 Hypo-osmolality and hyponatremia: Secondary | ICD-10-CM

## 2017-03-19 DIAGNOSIS — I2510 Atherosclerotic heart disease of native coronary artery without angina pectoris: Secondary | ICD-10-CM

## 2017-03-19 DIAGNOSIS — H5440 Blindness, one eye, unspecified eye: Secondary | ICD-10-CM

## 2017-03-19 DIAGNOSIS — R2990 Unspecified symptoms and signs involving the nervous system: Secondary | ICD-10-CM

## 2017-03-19 DIAGNOSIS — E876 Hypokalemia: Secondary | ICD-10-CM

## 2017-03-19 DIAGNOSIS — I639 Cerebral infarction, unspecified: Secondary | ICD-10-CM

## 2017-03-19 DIAGNOSIS — Z8679 Personal history of other diseases of the circulatory system: Secondary | ICD-10-CM

## 2017-03-19 DIAGNOSIS — I10 Essential (primary) hypertension: Secondary | ICD-10-CM

## 2017-03-19 DIAGNOSIS — F329 Major depressive disorder, single episode, unspecified: Secondary | ICD-10-CM

## 2017-03-19 LAB — CBC WITH DIFFERENTIAL/PLATELET
BASOS ABS: 0 10*3/uL (ref 0.0–0.1)
BASOS PCT: 0 %
Eosinophils Absolute: 0 10*3/uL (ref 0.0–0.7)
Eosinophils Relative: 0 %
HEMATOCRIT: 43.2 % (ref 36.0–46.0)
Hemoglobin: 14.3 g/dL (ref 12.0–15.0)
Lymphocytes Relative: 25 %
Lymphs Abs: 1.4 10*3/uL (ref 0.7–4.0)
MCH: 31.7 pg (ref 26.0–34.0)
MCHC: 33.1 g/dL (ref 30.0–36.0)
MCV: 95.8 fL (ref 78.0–100.0)
MONO ABS: 0.5 10*3/uL (ref 0.1–1.0)
Monocytes Relative: 9 %
NEUTROS ABS: 3.6 10*3/uL (ref 1.7–7.7)
Neutrophils Relative %: 66 %
PLATELETS: 170 10*3/uL (ref 150–400)
RBC: 4.51 MIL/uL (ref 3.87–5.11)
RDW: 13.2 % (ref 11.5–15.5)
WBC: 5.5 10*3/uL (ref 4.0–10.5)

## 2017-03-19 LAB — HEMOGLOBIN A1C
Hgb A1c MFr Bld: 5.3 % (ref 4.8–5.6)
MEAN PLASMA GLUCOSE: 105.41 mg/dL

## 2017-03-19 LAB — BASIC METABOLIC PANEL
ANION GAP: 9 (ref 5–15)
BUN: 9 mg/dL (ref 6–20)
CALCIUM: 8.7 mg/dL — AB (ref 8.9–10.3)
CO2: 25 mmol/L (ref 22–32)
Chloride: 98 mmol/L — ABNORMAL LOW (ref 101–111)
Creatinine, Ser: 0.49 mg/dL (ref 0.44–1.00)
Glucose, Bld: 118 mg/dL — ABNORMAL HIGH (ref 65–99)
POTASSIUM: 3.3 mmol/L — AB (ref 3.5–5.1)
Sodium: 132 mmol/L — ABNORMAL LOW (ref 135–145)

## 2017-03-19 LAB — LIPID PANEL
Cholesterol: 197 mg/dL (ref 0–200)
HDL: 62 mg/dL (ref >40)
LDL Cholesterol: 118 mg/dL — ABNORMAL HIGH (ref 0–99)
TRIGLYCERIDES: 86 mg/dL (ref <150)
Total CHOL/HDL Ratio: 3.2 RATIO
VLDL: 17 mg/dL (ref 0–40)

## 2017-03-19 LAB — ECHOCARDIOGRAM COMPLETE

## 2017-03-19 MED ORDER — ASPIRIN EC 81 MG PO TBEC
81.0000 mg | DELAYED_RELEASE_TABLET | Freq: Every day | ORAL | Status: DC
  Administered 2017-03-20 – 2017-03-24 (×5): 81 mg via ORAL
  Filled 2017-03-19 (×5): qty 1

## 2017-03-19 MED ORDER — FAMOTIDINE 20 MG PO TABS
20.0000 mg | ORAL_TABLET | Freq: Every day | ORAL | Status: DC
  Administered 2017-03-19 – 2017-03-23 (×5): 20 mg via ORAL
  Filled 2017-03-19 (×6): qty 1

## 2017-03-19 MED ORDER — PROSIGHT PO TABS
1.0000 | ORAL_TABLET | Freq: Every day | ORAL | Status: DC
  Administered 2017-03-19 – 2017-03-24 (×6): 1 via ORAL
  Filled 2017-03-19 (×6): qty 1

## 2017-03-19 MED ORDER — POTASSIUM CHLORIDE 10 MEQ/100ML IV SOLN
10.0000 meq | INTRAVENOUS | Status: DC
  Administered 2017-03-19: 10 meq via INTRAVENOUS
  Filled 2017-03-19 (×4): qty 100

## 2017-03-19 MED ORDER — HYOSCYAMINE SULFATE 0.125 MG SL SUBL
0.2500 mg | SUBLINGUAL_TABLET | Freq: Once | SUBLINGUAL | Status: AC
  Administered 2017-03-19: 0.25 mg via SUBLINGUAL
  Filled 2017-03-19: qty 2

## 2017-03-19 MED ORDER — MORPHINE SULFATE (PF) 2 MG/ML IV SOLN
1.0000 mg | INTRAVENOUS | Status: DC | PRN
  Administered 2017-03-19: 1 mg via INTRAVENOUS
  Filled 2017-03-19: qty 1

## 2017-03-19 MED ORDER — POTASSIUM CHLORIDE CRYS ER 20 MEQ PO TBCR
40.0000 meq | EXTENDED_RELEASE_TABLET | Freq: Once | ORAL | Status: AC
  Administered 2017-03-19: 40 meq via ORAL
  Filled 2017-03-19: qty 2

## 2017-03-19 NOTE — Telephone Encounter (Signed)
Spoke with patients daughter - patient is currently admitted d/t stroke. ILR was interrogated in the hospital today per daughter. I confirmed that ILR reached RRT as of 02/17/17 and a return kit was sent. Daughter verbalized understanding.

## 2017-03-19 NOTE — Care Management Note (Signed)
Case Management Note  Patient Details  Name: Randall Rampersad MRN: [id number] Date of Birth: 1924/12/13  Subjective/Objective:   Pt admitted with CVA. She is from home with her daughter. Pt blind in Lt eye and now having vision issues in Rt eye.                  Action/Plan: Awaiting PT eval. OT recommending CIR. CM following for d/c disposition.  Expected Discharge Date:                  Expected Discharge Plan:  [place]  In-House Referral:     Discharge planning Services  CM Consult  Post Acute Care Choice:    Choice offered to:     DME Arranged:    DME Agency:     HH Arranged:    [place] Agency:     Status of Service:  In process, will continue to follow  If discussed at Long Length of Stay Meetings, dates discussed:    Additional Comments:  Pollie Friar, RN 03/19/2017, 11:37 AM

## 2017-03-19 NOTE — Progress Notes (Addendum)
PROGRESS NOTE    [name] [name]  KVQ:[id number] DOB: Dec 30, 1924 DOA: 03/18/2017 PCP: [name] Austin, MD   No chief complaint on file.    Brief Narrative:  83 year old female history of prior CVA, possible atrial fibrillation versus SVT with implantable loop recorder, CAD, GERD, hypertension, diabetes who presented with right eye vision loss, left finger numbness and lip numbness. Patient admitted for CVA.  Assessment & Plan   Acute CVA -CT head showed no acute abnormality -MRI brain showed acute/early subacute infarction centered in the right anterior medial midbrain with involvement of the right cerebral peduncle. -CTA head: New short segment occlusion of the right PCA. No severe distal left P2 stenosis. No large vessel occlusion or shaking proximal stenosis in the anterior circulation. -LDL 118, hemoglobin A1c 5.3 -Pending echocardiogram and carotid doppler -Continue statin, Plavix -Neurology consulted and appreciated -Discussed with Dr. 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 Pending CIR and further workup  Consultants Neurology Inpatient rehab  Procedures  None  Antibiotics   Anti-infectives (From admission, onward)   None      Subjective:   [name] [name] seen and examined today.  No complaints today.  Patient denies dizziness, chest pain, shortness of breath, abdominal pain, N/V/D/C.     Objective:   Vitals:   03/19/17 0701 03/19/17 0800 03/19/17 0900 03/19/17 1030  BP: (!) 173/70 (!) 154/60 (!) 173/72 (!) 184/71  Pulse: 64 64 70 70  Resp: 20 20 18 20   Temp:    97.6 F (36.4 C)  TempSrc:    Oral  SpO2: 94% 98% 99%     Intake/Output Summary (Last 24 hours) at 03/19/2017 1310 Last data filed at 03/19/2017 0400 Gross per 24 hour  Intake -  Output 400 ml  Net -400 ml   There were no vitals filed for this visit.  Exam  General: Well developed, well nourished, NAD, appears stated age  HEENT: NCAT, mucous membranes moist.   Cardiovascular: S1 S2 auscultated, no rubs, murmurs or gallops. Regular rate and rhythm.  Respiratory: Clear to auscultation bilaterally with equal chest rise  Abdomen: Soft, nontender, nondistended, + bowel sounds  Extremities: warm dry without cyanosis clubbing or edema  Neuro: AAOx3, left side weakness, otherwise nonfocal (history of left sided blindness)  Psych: Appropriate mood and affect   Data Reviewed: I have personally reviewed following labs and imaging studies  CBC: Recent Labs  Lab 03/18/17 1545 03/18/17 1550 03/19/17 0456  WBC 5.0  --  5.5  NEUTROABS 2.5  --  3.6  HGB 15.2* 16.3* 14.3  HCT 47.1* 48.0* 43.2  MCV 98.5  --  95.8  PLT 180  --  643   Basic Metabolic Panel: Recent Labs  Lab 03/18/17 1545 03/18/17 1550 03/19/17 0456  NA 138 137 132*  K 3.7 4.0 3.3*  CL 101 99* 98*  CO2 24  --  25  GLUCOSE 165* 166* 118*  BUN 16 23* 9  CREATININE 0.74 0.80 0.49  CALCIUM 9.2  --  8.7*   GFR: CrCl cannot be calculated (Unknown ideal weight.). Liver Function Tests: Recent Labs  Lab 03/18/17 1545  AST 33  ALT 24  ALKPHOS 105  BILITOT 0.5  PROT 7.1  ALBUMIN 4.0   No results for input(s): LIPASE, AMYLASE in the last 168 hours. No results for input(s): AMMONIA in the last 168 hours. Coagulation Profile: Recent Labs  Lab 03/18/17 1545  INR 0.93   Cardiac Enzymes: No results for input(s):  CKTOTAL, CKMB, CKMBINDEX, TROPONINI in the last 168 hours. BNP (last 3 results) No results for input(s): PROBNP in the last 8760 hours. HbA1C: Recent Labs    03/19/17 0456  HGBA1C 5.3   CBG: No results for input(s): GLUCAP in the last 168 hours. Lipid Profile: Recent Labs    03/19/17 0456  CHOL 197  HDL 62  LDLCALC 118*  TRIG 86  CHOLHDL 3.2   Thyroid Function Tests: No results for input(s): TSH, T4TOTAL, FREET4, T3FREE, THYROIDAB in the last 72 hours. Anemia Panel: No results for input(s): VITAMINB12, FOLATE, FERRITIN, TIBC, IRON, RETICCTPCT in the last 72 hours. Urine analysis:    Component Value Date/Time   COLORURINE YELLOW 12/19/2014 1830   APPEARANCEUR CLOUDY (A) 12/19/2014 1830   LABSPEC 1.011 12/19/2014 1830   PHURINE 6.0 12/19/2014 1830   GLUCOSEU NEGATIVE 12/19/2014 1830   HGBUR NEGATIVE 12/19/2014 1830   BILIRUBINUR NEGATIVE 12/19/2014 1830   KETONESUR NEGATIVE 12/19/2014 1830   PROTEINUR NEGATIVE 12/19/2014 1830   UROBILINOGEN 0.2 12/19/2014 1830   NITRITE NEGATIVE 12/19/2014 1830   LEUKOCYTESUR LARGE (A) 12/19/2014 1830   Sepsis Labs: @LABRCNTIP (procalcitonin:4,lacticidven:4)  )No results found for this or any previous visit (from the past 240 hour(s)).    Radiology Studies: Ct Angio Head W Or Wo Contrast  Result Date: 03/18/2017 CLINICAL DATA:  Left-sided weakness and facial droop. EXAM: CT ANGIOGRAPHY HEAD AND NECK TECHNIQUE: Multidetector CT imaging of the head and neck was performed using the standard protocol during bolus administration of intravenous contrast. Multiplanar CT image reconstructions and MIPs were obtained to evaluate the vascular anatomy. Carotid stenosis measurements (when applicable) are obtained utilizing NASCET criteria, using the distal internal carotid diameter as the denominator. CONTRAST:  50 mL Isovue 370 COMPARISON:  Head CT today. Head MRI/ MRA 01/04/2014. Soft tissue neck CT 10/12/2007. FINDINGS: CTA NECK FINDINGS Aortic  arch: Standard 3 vessel aortic arch with mild atherosclerotic plaque. Widely patent brachiocephalic and subclavian arteries. Tortuous subclavian arteries. Right carotid system: Widely patent without evidence of stenosis or dissection. Small amount of calcified plaque at the carotid bifurcation and in the distal cervical ICA. Tortuous proximal common carotid artery. Left carotid system: Patent without evidence of stenosis or dissection. Tortuous common and internal carotid arteries. Partially retropharyngeal course of the ICA. Vertebral arteries: Patent without evidence of stenosis or dissection. Dominant left vertebral artery. Skeleton: Moderate cervical disc degeneration. Other neck: No mass or lymph node enlargement. Upper chest: Partially visualize mosaic attenuation in the lung apices which could reflect small vessel or airway disease. Review of the MIP images confirms the above findings CTA HEAD FINDINGS Anterior circulation: The internal carotid arteries are patent from skullbase to carotid termini. There is mild siphon atherosclerosis bilaterally without stenosis. ACAs and MCAs are patent with mild atherosclerotic irregularity but no significant proximal stenosis or proximal branch occlusion. No aneurysm. Posterior circulation: The intracranial vertebral arteries are widely patent to the basilar. The right PICA and left AICA are dominant. Patent SCA origins are seen bilaterally. The basilar artery is widely patent.  There is new occlusion of the right PCA at its origin over a length of 4 mm with reconstitution of the more distal P1 segment and beyond. The left PCA is patent without significant proximal stenosis, however there is a new moderate distal left P2 stenosis. Distal PCA branch vessel irregularity and attenuation are noted bilaterally. No aneurysm. Venous sinuses: Patent. Anatomic variants: None. Delayed phase: Not performed. Review of the MIP images confirms the above findings IMPRESSION: 1. New short  segment occlusion of the right PCA at its origin. 2. New severe distal left P2 stenosis. 3. No large vessel occlusion or significant proximal stenosis in the anterior circulation. 4. Widely patent cervical carotid and vertebral arteries. 5.  Aortic Atherosclerosis (ICD10-I70.0). These results were called by telephone at the time of interpretation on 03/18/2017 at 4:22 pm to Dr. Rory Percy, who verbally acknowledged these results. Electronically Signed   By: Logan Bores M.D.   On: 03/18/2017 16:38   Ct Angio Neck W Or Wo Contrast  Result Date: 03/18/2017 CLINICAL DATA:  Left-sided weakness and facial droop. EXAM: CT ANGIOGRAPHY HEAD AND NECK TECHNIQUE: Multidetector CT imaging of the head and neck was performed using the standard protocol during bolus administration of intravenous contrast. Multiplanar CT image reconstructions and MIPs were obtained to evaluate the vascular anatomy. Carotid stenosis measurements (when applicable) are obtained utilizing NASCET criteria, using the distal internal carotid diameter as the denominator. CONTRAST:  50 mL Isovue 370 COMPARISON:  Head CT today. Head MRI/ MRA 01/04/2014. Soft tissue neck CT 10/12/2007. FINDINGS: CTA NECK FINDINGS Aortic arch: Standard 3 vessel aortic arch with mild atherosclerotic plaque. Widely patent brachiocephalic and subclavian arteries. Tortuous subclavian arteries. Right carotid system: Widely patent without evidence of stenosis or dissection. Small amount of calcified plaque at the carotid bifurcation and in the distal cervical ICA. Tortuous proximal common carotid artery. Left carotid system: Patent without evidence of stenosis or dissection. Tortuous common and internal carotid arteries. Partially retropharyngeal course of the ICA. Vertebral arteries: Patent without evidence of stenosis or dissection. Dominant left vertebral artery. Skeleton: Moderate cervical disc degeneration. Other neck: No mass or lymph node enlargement. Upper chest: Partially  visualize mosaic attenuation in the lung apices which could reflect small vessel or airway disease. Review of the MIP images confirms the above findings CTA HEAD FINDINGS Anterior circulation: The internal carotid arteries are patent from skullbase to carotid termini. There is mild siphon atherosclerosis bilaterally without stenosis. ACAs and MCAs are patent with mild atherosclerotic irregularity but no significant proximal stenosis or proximal branch occlusion. No aneurysm. Posterior circulation: The intracranial vertebral arteries are widely patent to the basilar. The right PICA and left AICA are dominant. Patent SCA origins are seen bilaterally. The basilar artery is widely patent. There is new occlusion of the right PCA at its origin over a length of 4 mm with reconstitution of the more distal P1 segment and beyond. The left PCA is patent without significant proximal stenosis, however there is a new moderate distal left P2 stenosis. Distal PCA branch vessel irregularity and attenuation are noted bilaterally. No aneurysm. Venous sinuses: Patent. Anatomic variants: None. Delayed phase: Not performed. Review of the MIP images confirms the above findings IMPRESSION: 1. New short segment occlusion of the right PCA at its origin. 2. New severe distal left P2 stenosis. 3. No large vessel occlusion or significant proximal stenosis in the anterior circulation. 4. Widely patent cervical carotid and vertebral arteries. 5.  Aortic Atherosclerosis (ICD10-I70.0). These results were called by telephone at the  time of interpretation on 03/18/2017 at 4:22 pm to Dr. Rory Percy, who verbally acknowledged these results. Electronically Signed   By: Logan Bores M.D.   On: 03/18/2017 16:38   Mr Brain Wo Contrast  Result Date: 03/18/2017 CLINICAL DATA:  83 y/o F; stroke for follow-up. Left-sided weakness and facial droop. EXAM: MRI HEAD WITHOUT CONTRAST TECHNIQUE: Multiplanar, multiecho pulse sequences of the brain and surrounding  structures were obtained without intravenous contrast. COMPARISON:  03/18/2017 CT head and CT angiogram head. 12/25/2013 MRI head. FINDINGS: Brain: Focus of reduced diffusion spanning 12 mm centered in the right anteromedial midbrain and extending into the right cerebral peduncle (series 3, image 21 and 22). No associated hemorrhage or mass effect. Numerous nonspecific foci of T2 FLAIR hyperintense signal abnormality in subcortical and periventricular white matter are compatible with moderate chronic microvascular ischemic changes for age. Moderate brain parenchymal volume loss. Small chronic cortical infarcts are present in bilateral parietal lobes and the left frontal lobe. Vascular: Negative. Skull and upper cervical spine: Normal marrow signal. Sinuses/Orbits: Underpneumatized frontal sinuses. Mild mucosal thickening of sphenoid and ethmoid sinuses. Trace effusions and mastoid tips. Left phthisis bulbi. Right intra-ocular lens replacement. Other: None. IMPRESSION: 1. 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 No associated hemorrhage or mass effect. 2. Moderate chronic microvascular ischemic changes and moderate parenchymal volume loss of the brain. Small chronic cortical infarcts are present in bilateral parietal and left frontal lobes. These results will be called to the ordering clinician or representative by the Radiologist Assistant, and communication documented in the PACS or zVision Dashboard. Electronically Signed   By: Kristine Garbe M.D.   On: 03/18/2017 22:14   Ct Head Code Stroke Wo Contrast`  Addendum Date: 03/18/2017   ADDENDUM REPORT: 03/18/2017 16:13 ADDENDUM: These results were called by telephone at the time of interpretation on 03/18/2017 at 4:13 pm to Dr. Rory Percy, who verbally acknowledged these results. Electronically Signed   By: Franchot Gallo M.D.   On: 03/18/2017 16:13   Result Date:  03/18/2017 CLINICAL DATA:  Code stroke.  Left-sided weakness, facial droop EXAM: CT HEAD WITHOUT CONTRAST TECHNIQUE: Contiguous axial images were obtained from the base of the skull through the vertex without intravenous contrast. COMPARISON:  MRI and CT 01/04/2014 FINDINGS: Brain: Mild atrophy. Negative for hydrocephalus. Patchy white matter hyperintensity bilaterally appears chronic and unchanged. Negative for hemorrhage, acute infarct, or mass lesion. Vascular: Negative for hyperdense vessel Skull: Negative Sinuses/Orbits: Negative Other: None ASPECTS ([place] Stroke Program Early CT Score) - Ganglionic level infarction (caudate, lentiform nuclei, internal capsule, insula, M1-M3 cortex): 7 - Supraganglionic infarction (M4-M6 cortex): 3 Total score (0-10 with 10 being normal): 10 IMPRESSION: 1. No acute abnormality. Atrophy and chronic microvascular ischemia in the white matter 2. ASPECTS is 10 Electronically Signed: By: Franchot Gallo M.D. On: 03/18/2017 15:56     Scheduled Meds: . artificial tears  1 application Both Eyes QHS  . atorvastatin  40 mg Oral q1800  . clopidogrel  75 mg Oral Daily  . enoxaparin (LOVENOX) injection  40 mg Subcutaneous Q24H  . multivitamin  1 tablet Oral QPC breakfast  . prednisoLONE acetate  1 drop Both Eyes QHS   Continuous Infusions:   LOS: 1 day   Time Spent in minutes   30 minutes  Hessie Varone D.O. on 03/19/2017 at 1:10 PM  Between 7am to 7pm - Pager - [phone]  After 7pm go to [email or web address] - password [email or web address]  And look for the  night coverage person covering for me after hours  [facility] Office  [phone]

## 2017-03-19 NOTE — Evaluation (Signed)
Occupational Therapy Evaluation Patient Details Name: [name] [name] MRN: [id number] DOB: June 10, 1924 Today's Date: 03/19/2017    History of Present Illness 83 y.o. 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 CT angio head/neck showed new short segment occlusion of the right PCA at its origin. New severe distal left P2 stenosis.MRI showed: Acute/early subacute infarction centered in right anteromedial midbrain with involvement of right cerebral peduncle.    Clinical Impression   Pt reports she was independent with ADL and mobility PTA. Currently pt overall max assist for stand pivot transfer and mod-max assist for ADL. Pt presenting with difficulty sequencing tasks, disoriented to place, slight L sided weakness when MMT but apparent weakness/impaired coordination with functional tasks impacting her independence and safety with ADL and functional mobility. Recommending CIR level therapies to maximize independence and safety with ADL and functional mobility. Pt would benefit from continued skilled OT to address established goals.    Follow Up Recommendations  CIR;Supervision/Assistance - 24 hour    Equipment Recommendations  Other (comment)(TBD at next venue)    Recommendations for Other Services PT consult;Rehab consult     Precautions / Restrictions Precautions Precautions: Fall Restrictions Weight Bearing Restrictions: No      Mobility Bed Mobility Overal bed mobility: Needs Assistance Bed Mobility: Supine to Sit;Sit to Supine     Supine to sit: Min assist Sit to supine: Min assist   General bed mobility comments: Assist for trunk elevation to sitting and for LEs back to bed.  Transfers Overall transfer level: Needs assistance Equipment used: Rolling  walker (2 wheeled);1 person hand held assist Transfers: Sit to/from [employer] Sit to Stand: Mod assist Stand pivot transfers: Max assist       General transfer comment: Assist to boost up from EOBx1, BSC x1. Stand pivot to R side with max cues for sequencing and safety.    Balance Overall balance assessment: Needs assistance Sitting-balance support: Feet supported;Bilateral upper extremity supported Sitting balance-Leahy Scale: Poor   Postural control: Left lateral lean;Posterior lean Standing balance support: Bilateral upper extremity supported Standing balance-Leahy Scale: Poor Standing balance comment: Max assist for standing balance                           ADL either performed or assessed with clinical judgement   ADL Overall ADL's : Needs assistance/impaired Eating/Feeding: Minimal assistance;Sitting   Grooming: Moderate assistance;Sitting   Upper Body Bathing: Moderate assistance;Sitting   Lower Body Bathing: Maximal assistance;Sit to/from stand   Upper Body Dressing : Moderate assistance;Sitting   Lower Body Dressing: Maximal assistance;Sit to/from stand   Toilet Transfer: Maximal assistance;Stand-pivot;BSC Toilet Transfer Details (indicate cue type and reason): Attempted with and without RW. Toileting- Clothing Manipulation and Hygiene: Minimal assistance;Sitting/lateral lean Toileting - Clothing Manipulation Details (indicate cue type and reason): for peri care, min assist for balance     Functional mobility during ADLs: Maximal assistance(for stand pivot only)       Vision Baseline Vision/History: Wears glasses(L eye blind) Wears Glasses: Reading only Patient Visual Report: No change from baseline Additional Comments: Needs further assessment during functional tasks.     Perception     Praxis      Pertinent Vitals/Pain Pain Assessment: Faces Faces Pain Scale: Hurts  even more Pain Location: neck Pain Descriptors /  Indicators: Aching;Sore Pain Intervention(s): Monitored during session;Repositioned     Hand Dominance Right   Extremity/Trunk Assessment Upper Extremity Assessment Upper Extremity Assessment: LUE deficits/detail LUE Deficits / Details: Grossly 4/5, full AROM.    Lower Extremity Assessment Lower Extremity Assessment: Defer to PT evaluation   Cervical / Trunk Assessment Cervical / Trunk Assessment: Kyphotic   Communication Communication Communication: No difficulties   Cognition Arousal/Alertness: Lethargic Behavior During Therapy: Flat affect Overall Cognitive Status: Impaired/Different from baseline Area of Impairment: Orientation;Memory;Following commands;Safety/judgement;Problem solving                 Orientation Level: Disoriented to;Time;Place;Situation   Memory: Decreased short-term memory Following Commands: Follows one step commands inconsistently;Follows one step commands with increased time Safety/Judgement: Decreased awareness of safety;Decreased awareness of deficits   Problem Solving: Slow processing;Decreased initiation;Difficulty sequencing;Requires verbal cues;Requires tactile cues     General Comments       Exercises     Shoulder Instructions      Home Living Family/patient expects to be discharged to:: Private residence Living Arrangements: Alone Available Help at Discharge: Family;Available PRN/intermittently Type of Home: House Home Access: Stairs to enter [employer] of Steps: 3 in front, 1 in back   Home Layout: One level     Bathroom Shower/Tub: Teacher, early years/pre: Standard     Home Equipment: None          Prior Functioning/Environment Level of Independence: Independent        Comments: does not drive but independent with all ADL        OT Problem List: Decreased strength;Decreased activity tolerance;Impaired balance (sitting and/or standing);Impaired vision/perception;Decreased  cognition;Decreased safety awareness;Decreased knowledge of use of DME or AE;Pain      OT Treatment/Interventions: Self-care/ADL training;Therapeutic exercise;Neuromuscular education;Energy conservation;DME and/or AE instruction;Therapeutic activities;Cognitive remediation/compensation;Visual/perceptual remediation/compensation;Patient/family education;Balance training    OT Goals(Current goals can be found in the care plan section) Acute Rehab OT Goals Patient Stated Goal: return to independence OT Goal Formulation: With patient/family Time For Goal Achievement: 04/02/17 Potential to Achieve Goals: Good  OT Frequency: Min 2X/week   Barriers to D/C:            Co-evaluation              AM-PAC PT "6 Clicks" Daily Activity     Outcome Measure Help from another person eating meals?: A Little Help from another person taking care of personal grooming?: A Lot Help from another person toileting, which includes using toliet, bedpan, or urinal?: A Lot Help from another person bathing (including washing, rinsing, drying)?: A Lot Help from another person to put on and taking off regular upper body clothing?: A Lot Help from another person to put on and taking off regular lower body clothing?: A Lot 6 Click Score: 13   End of Session Equipment Utilized During Treatment: Rolling walker  Activity Tolerance: Patient tolerated treatment well Patient left: in bed;with call bell/phone within reach;with family/visitor present  OT Visit Diagnosis: Unsteadiness on feet (R26.81);Other abnormalities of gait and mobility (R26.89);Muscle weakness (generalized) (M62.81);Low vision, both eyes (H54.2);Hemiplegia and hemiparesis Hemiplegia - Right/Left: Left Hemiplegia - dominant/non-dominant: Non-Dominant Hemiplegia - caused by: Cerebral infarction                Time: 3086-5784 OT Time Calculation (min): 23 min Charges:  OT General Charges $OT Visit: 1 Visit OT Evaluation $OT Eval Moderate  Complexity: 1 Mod OT Treatments $Self Care/Home  Management : 8-22 mins G-Codes:     Mervin Hack. Ulice Brilliant, M.S., OTR/L Pager: [phone] 03/19/2017, 9:40 AM

## 2017-03-19 NOTE — Consult Note (Signed)
Physical Medicine and Rehabilitation Consult  Reason for Consult: stroke with functional deficits Referring Physician: Dr. Ree Kida    HPI: Kerisha Goughnour is a 83 y.o. female with history of HTN, SVT, CAD, left eye blindness who was admitted on 03/18/17 with sudden loss of vision in right eye, left facial numbness and elevated BP. History taken from chart review and family. CTA head/neck showed new short segment occlusion of R-PCA at origin with new severe distal left P2 stenosis. Vision improved to baseline and tPA not given. MRI brain reviewed, showing right brainstem infarct.  Per report, acute/early subacute infarct in right anteromedial midbrain with involvement of right cerebral peduncle and moderate chronic small vessel disease with moderate parenchymal brain volume loss. She was admitted for work up and OT evaluation this am revealed difficulty with sequencing ADL tasks, disorientation and mild left sided weakness with balance deficits. PTA, pt was independent at home, living by herself.  CIR recommended due to functional deficits.   Review of Systems  Constitutional: Positive for malaise/fatigue. Negative for chills and fever.  HENT: Negative for hearing loss and tinnitus.   Eyes: Negative for redness.       Blind in left eye and decreased vision right eye  Respiratory: Negative for shortness of breath.   Cardiovascular: Negative for chest pain and palpitations.  Gastrointestinal: Negative for abdominal pain and heartburn.  Genitourinary: Negative for dysuria.  Musculoskeletal: Positive for myalgias.  Neurological: Positive for sensory change (LUE) and headaches (since admission).  Psychiatric/Behavioral: Positive for memory loss.  All other systems reviewed and are negative.     Past Medical History:  Diagnosis Date  . Arthritis    "thumbs, joints" (08/07/2015)  . Basal cell carcinoma    "several; scattered over my face, hands, leg some cut off; some burned off"  .  Blind left eye    a. Corneal transplant x3 with rejection  . CAD (coronary artery disease)    a. s/p NSTEMI 2015 treated conservatively; nuclear stress test low risk. b. Continued angina despite med rx 07/2015 - s/p io [place] Stent to ramus intermedius with mod LAD stenosis neg by FFR.  Marland Kitchen Chronic right hip pain   . Complication of anesthesia    "very sensitive to RX"  . Diverticulitis large intestine   . Diverticulosis   . Gastritis   . GERD (gastroesophageal reflux disease)   . Hayfever   . Helicobacter pylori (H. pylori) 05/22/02   RUT-Positive  . Hypertension   . Myocardial infarction ([place]) 2015  . Pre-diabetes   . Squamous carcinoma    "several; scattered over my face, hands, leg some cut off; some burned off"  . Stroke [facility]) 2015   denies residual on 08/07/2015  . SVT (supraventricular tachycardia) (HCC)    a. seen by Dr. Caryl Comes - has loop recorder in. Patient has declined amiodarone due to side effect profile  . Varicose veins     Past Surgical History:  Procedure Laterality Date  . BASAL CELL CARCINOMA EXCISION    . CARDIAC CATHETERIZATION N/A 08/07/2015   Procedure: Left Heart Cath and Coronary Angiography;  Surgeon: [name], MD;  Location: [place] CV LAB;  Service: Cardiovascular;  Laterality: N/A;  . CARDIAC CATHETERIZATION N/A 08/07/2015   Procedure: Coronary Stent Intervention;  Surgeon: [name], MD;  Location: [place] CV LAB;  Service: Cardiovascular;  Laterality: N/A;  . CARDIAC CATHETERIZATION N/A 08/07/2015   Procedure: Intravascular Pressure Wire/FFR Study;  Surgeon: [name], MD;  Location: [place] CV LAB;  Service: Cardiovascular;  Laterality: N/A;  . CATARACT EXTRACTION W/ INTRAOCULAR LENS  IMPLANT, BILATERAL Bilateral 2005  . CLOSED REDUCTION SHOULDER DISLOCATION Left 2016 X 2  . CORNEAL TRANSPLANT  right 2009, left 2009   both eyes, 3 in left eye, 1 in right eye  . CORONARY ANGIOPLASTY WITH STENT PLACEMENT  08/07/2015   "1 stent"    . EYE SURGERY    . JOINT REPLACEMENT    . [place]   "had gangrene in it"  . LOOP RECORDER IMPLANT N/A 01/05/2014   Procedure: LOOP RECORDER IMPLANT;  Surgeon: Coralyn Mark, MD;  Location: [place] CATH LAB;  Service: Cardiovascular;  Laterality: N/A;  . SQUAMOUS CELL CARCINOMA EXCISION    . TONSILLECTOMY    . TOTAL ABDOMINAL HYSTERECTOMY  11/1983   "ovaries and all"  . TOTAL KNEE ARTHROPLASTY Left 06/27/2012   Procedure: LEFT TOTAL KNEE ARTHROPLASTY;  Surgeon: Gearlean Alf, MD;  Location: [facility];  Service: Orthopedics;  Laterality: Left;  . TOTAL KNEE ARTHROPLASTY Right 12/12/2012   Procedure: RIGHT TOTAL KNEE ARTHROPLASTY;  Surgeon: Gearlean Alf, MD;  Location: [facility];  Service: Orthopedics;  Laterality: Right;  . TUBAL LIGATION  1966    Family History  Problem Relation Age of Onset  . Stroke Mother   . Heart attack Father   . Heart attack Brother   . Cancer Brother   . Heart attack Brother   . Heart attack Brother   . Heart attack Brother   . Parkinsonism Brother     Social History: Lives alone and independent PTA.  She  reports that  has never smoked. she has never used smokeless tobacco. She reports that she does not drink alcohol or use drugs.    Allergies  Allergen Reactions  . Contrast Media [Iodinated Diagnostic Agents] Anaphylaxis  . Fluorescein Anaphylaxis, Shortness Of Breath, Itching and Swelling    Tongue became swollen  . Other Anaphylaxis, Shortness Of Breath, Swelling and Other (See Comments)    Yellow eye dye that is used to check retina (Fluorescein) Caused swelling of tongue  . Sulfa Antibiotics Rash  . Ultram [Tramadol] Hives  . Buprenex [Buprenorphine Hcl] Palpitations and Other (See Comments)    Heart raced  . Buprenorphine Hcl Other (See Comments)    Impacted blood pressure (high or low??)  . Keflex [Cephalexin] Diarrhea  . Levofloxacin Other (See Comments)    Reaction not recalled  . Oxycodone-Acetaminophen Nausea And  Vomiting  . Yellow Dye Itching, Swelling and Other (See Comments)    Fluorescein (in eye drops) - Tongue became swollen  . Augmentin [Amoxicillin-Pot Clavulanate] Nausea And Vomiting  . Hydralazine Hcl Swelling, Palpitations and Rash    Patient developed facial flushing, swelling in legs.  . Levofloxacin Other (See Comments)    Reaction not recalled  . Percocet [Oxycodone-Acetaminophen] Nausea And Vomiting and Other (See Comments)    "Felt drained," also  . Tape Other (See Comments)    Adhesive tape pulls off the skin  . Zetia [Ezetimibe] Other (See Comments)    Muscle weakness    Medications Prior to Admission  Medication Sig Dispense Refill  . amLODipine (NORVASC) 2.5 MG tablet Take 1 tablet (2.5 mg total) by mouth daily as needed (BLOOD PRESSURE). Only take if greater than 140 (Patient taking differently: Take 2.5 mg by mouth daily as needed (for blood pressure/take only if Systolic reading is greater than 180). ) 90 tablet 3  .  clopidogrel (PLAVIX) 75 MG tablet take 1 tablet by mouth once daily (Patient taking differently: Take 75 mg by mouth once a day) 90 tablet 3  . hyoscyamine (LEVSIN, ANASPAZ) 0.125 MG tablet Take 1 tablet (0.125 mg total) by mouth every 4 (four) hours as needed for cramping. 30 tablet 4  . Magnesium 250 MG TABS Take 250 mg by mouth at bedtime.    . Melatonin 1 MG SUBL Place 1 mg under the tongue at bedtime.    . Multiple Vitamin (MULTI VITAMIN DAILY PO) Take 1 tablet by mouth every morning.    . Multiple Vitamins-Minerals (PRESERVISION AREDS 2) CAPS Take 1 capsule by mouth daily after breakfast.    . nebivolol (BYSTOLIC) 5 MG tablet Take one tablet by mouth daily.  If SBP is greater than 180 you can take an additional 5mg  daily as needed (Patient taking differently: Take 5-10 mg by mouth See admin instructions. 5 mg once a day at 6 PM and take an additional 5 mg once a day if needed if Systolic reading is greater than 180) 60 tablet 6  . nitroGLYCERIN (NITROSTAT)  0.4 MG SL tablet Place 1 tablet (0.4 mg total) under the tongue every 5 (five) minutes as needed for chest pain. 25 tablet 2  . Polyethyl Glycol-Propyl Glycol (SYSTANE ULTRA) 0.4-0.3 % SOLN Apply 1 drop to eye 2 (two) times daily as needed (for irritation).     Vladimir Faster Glycol-Propyl Glycol (SYSTANE) 0.4-0.3 % GEL ophthalmic gel Place 1 application into both eyes at bedtime.    . prednisoLONE acetate (PRED FORTE) 1 % ophthalmic suspension Place 1 drop into both eyes See admin instructions. 1 drop into both eyes at bedtime spaced 5 minutes apart from Systane gel    . ranitidine (ZANTAC) 150 MG tablet Take 1 tablet (150 mg total) by mouth at bedtime. 90 tablet 3    Home: Home Living Family/patient expects to be discharged to:: Private residence Living Arrangements: Alone Available Help at Discharge: Family, Available PRN/intermittently Type of Home: House Home Access: Stairs to enter [employer] of Steps: 3 in front, 1 in back Home Layout: One level Bathroom Shower/Tub: Chiropodist: Standard Home Equipment: None  Functional History: Prior Function Level of Independence: Independent Comments: does not drive but independent with all ADL Functional Status:  Mobility: Bed Mobility Overal bed mobility: Needs Assistance Bed Mobility: Supine to Sit, Sit to Supine Supine to sit: Min assist Sit to supine: Min assist General bed mobility comments: Assist for trunk elevation to sitting and for LEs back to bed. Transfers Overall transfer level: Needs assistance Equipment used: Rolling walker (2 wheeled), 1 person hand held assist Transfers: Sit to/from Stand, Stand Pivot Transfers Sit to Stand: Mod assist Stand pivot transfers: Max assist General transfer comment: Assist to boost up from EOBx1, BSC x1. Stand pivot to R side with max cues for sequencing and safety.      ADL: ADL Overall ADL's : Needs assistance/impaired Eating/Feeding: Minimal  assistance, Sitting Grooming: Moderate assistance, Sitting Upper Body Bathing: Moderate assistance, Sitting Lower Body Bathing: Maximal assistance, Sit to/from stand Upper Body Dressing : Moderate assistance, Sitting Lower Body Dressing: Maximal assistance, Sit to/from stand Toilet Transfer: Maximal assistance, Stand-pivot, BSC Toilet Transfer Details (indicate cue type and reason): Attempted with and without RW. Toileting- Clothing Manipulation and Hygiene: Minimal assistance, Sitting/lateral lean Toileting - Clothing Manipulation Details (indicate cue type and reason): for peri care, min assist for balance Functional mobility during ADLs: Maximal assistance(for stand pivot  only)  Cognition: Cognition Overall Cognitive Status: Impaired/Different from baseline Orientation Level: Oriented X4 Cognition Arousal/Alertness: Lethargic Behavior During Therapy: Flat affect Overall Cognitive Status: Impaired/Different from baseline Area of Impairment: Orientation, Memory, Following commands, Safety/judgement, Problem solving Orientation Level: Disoriented to, Time, Place, Situation Memory: Decreased short-term memory Following Commands: Follows one step commands inconsistently, Follows one step commands with increased time Safety/Judgement: Decreased awareness of safety, Decreased awareness of deficits Problem Solving: Slow processing, Decreased initiation, Difficulty sequencing, Requires verbal cues, Requires tactile cues   Blood pressure (!) 184/71, pulse 70, temperature 97.6 F (36.4 C), temperature source Oral, resp. rate 20, SpO2 99 %. Physical Exam  Nursing note and vitals reviewed. Constitutional: She is oriented to person, place, and time. She appears well-developed and well-nourished. No distress.  Lying in bed reaching for items in right field.   HENT:  Head: Normocephalic and atraumatic.  Mouth/Throat: Oropharynx is clear and moist.  Eyes: Right eye exhibits no discharge. Left  eye exhibits no discharge.  Left ptosis.   Neck: Normal range of motion. Neck supple.  Cardiovascular: Normal rate and regular rhythm.  Respiratory: Effort normal and breath sounds normal. No stridor. She has no wheezes. She has no rales.  GI: Soft. Bowel sounds are normal. She exhibits no distension. There is no tenderness.  Musculoskeletal: She exhibits tenderness. She exhibits no edema.  Neurological: She is alert and oriented to person, place, and time.  Mild dysarthria.  Disoriented and distracted needing redirection.  She was able to follow simple motor commands without difficulty.  Sensation diminished to light touch left fingertips and lips Motor: 5/5 throughout Unable to assess ataxia/dysmetria due to visual deficits Some receptive deficits notes Confusion  Skin: Skin is warm and dry. She is not diaphoretic.  Psychiatric: She has a normal mood and affect. Her speech is tangential. She is slowed. Cognition and memory are impaired. She expresses impulsivity. She is inattentive.    Results for orders placed or performed during the hospital encounter of 03/18/17 (from the past 24 hour(s))  Protime-INR     Status: None   Collection Time: 03/18/17  3:45 PM  Result Value Ref Range   Prothrombin Time 12.4 11.4 - 15.2 seconds   INR 0.93   APTT     Status: None   Collection Time: 03/18/17  3:45 PM  Result Value Ref Range   aPTT 26 24 - 36 seconds  CBC     Status: Abnormal   Collection Time: 03/18/17  3:45 PM  Result Value Ref Range   WBC 5.0 4.0 - 10.5 K/uL   RBC 4.78 3.87 - 5.11 MIL/uL   Hemoglobin 15.2 (H) 12.0 - 15.0 g/dL   HCT 47.1 (H) 36.0 - 46.0 %   MCV 98.5 78.0 - 100.0 fL   MCH 31.8 26.0 - 34.0 pg   MCHC 32.3 30.0 - 36.0 g/dL   RDW 13.4 11.5 - 15.5 %   Platelets 180 150 - 400 K/uL  Differential     Status: None   Collection Time: 03/18/17  3:45 PM  Result Value Ref Range   Neutrophils Relative % 50 %   Neutro Abs 2.5 1.7 - 7.7 K/uL   Lymphocytes Relative 42 %    Lymphs Abs 2.1 0.7 - 4.0 K/uL   Monocytes Relative 7 %   Monocytes Absolute 0.4 0.1 - 1.0 K/uL   Eosinophils Relative 1 %   Eosinophils Absolute 0.1 0.0 - 0.7 K/uL   Basophils Relative 0 %   Basophils Absolute 0.0  0.0 - 0.1 K/uL  Comprehensive metabolic panel     Status: Abnormal   Collection Time: 03/18/17  3:45 PM  Result Value Ref Range   Sodium 138 135 - 145 mmol/L   Potassium 3.7 3.5 - 5.1 mmol/L   Chloride 101 101 - 111 mmol/L   CO2 24 22 - 32 mmol/L   Glucose, Bld 165 (H) 65 - 99 mg/dL   BUN 16 6 - 20 mg/dL   Creatinine, Ser 0.74 0.44 - 1.00 mg/dL   Calcium 9.2 8.9 - 10.3 mg/dL   Total Protein 7.1 6.5 - 8.1 g/dL   Albumin 4.0 3.5 - 5.0 g/dL   AST 33 15 - 41 U/L   ALT 24 14 - 54 U/L   Alkaline Phosphatase 105 38 - 126 U/L   Total Bilirubin 0.5 0.3 - 1.2 mg/dL   GFR calc non Af Amer >60 >60 mL/min   GFR calc Af Amer >60 >60 mL/min   Anion gap 13 5 - 15  I-stat troponin, ED     Status: None   Collection Time: 03/18/17  3:49 PM  Result Value Ref Range   Troponin i, poc 0.00 0.00 - 0.08 ng/mL   Comment 3          I-Stat Chem 8, ED     Status: Abnormal   Collection Time: 03/18/17  3:50 PM  Result Value Ref Range   Sodium 137 135 - 145 mmol/L   Potassium 4.0 3.5 - 5.1 mmol/L   Chloride 99 (L) 101 - 111 mmol/L   BUN 23 (H) 6 - 20 mg/dL   Creatinine, Ser 0.80 0.44 - 1.00 mg/dL   Glucose, Bld 166 (H) 65 - 99 mg/dL   Calcium, Ion 1.14 (L) 1.15 - 1.40 mmol/L   TCO2 31 22 - 32 mmol/L   Hemoglobin 16.3 (H) 12.0 - 15.0 g/dL   HCT 48.0 (H) 36.0 - 46.0 %  Hemoglobin A1c     Status: None   Collection Time: 03/19/17  4:56 AM  Result Value Ref Range   Hgb A1c MFr Bld 5.3 4.8 - 5.6 %   Mean Plasma Glucose 105.41 mg/dL  CBC with Differential/Platelet     Status: None   Collection Time: 03/19/17  4:56 AM  Result Value Ref Range   WBC 5.5 4.0 - 10.5 K/uL   RBC 4.51 3.87 - 5.11 MIL/uL   Hemoglobin 14.3 12.0 - 15.0 g/dL   HCT 43.2 36.0 - 46.0 %   MCV 95.8 78.0 - 100.0 fL   MCH  31.7 26.0 - 34.0 pg   MCHC 33.1 30.0 - 36.0 g/dL   RDW 13.2 11.5 - 15.5 %   Platelets 170 150 - 400 K/uL   Neutrophils Relative % 66 %   Neutro Abs 3.6 1.7 - 7.7 K/uL   Lymphocytes Relative 25 %   Lymphs Abs 1.4 0.7 - 4.0 K/uL   Monocytes Relative 9 %   Monocytes Absolute 0.5 0.1 - 1.0 K/uL   Eosinophils Relative 0 %   Eosinophils Absolute 0.0 0.0 - 0.7 K/uL   Basophils Relative 0 %   Basophils Absolute 0.0 0.0 - 0.1 K/uL  Basic metabolic panel     Status: Abnormal   Collection Time: 03/19/17  4:56 AM  Result Value Ref Range   Sodium 132 (L) 135 - 145 mmol/L   Potassium 3.3 (L) 3.5 - 5.1 mmol/L   Chloride 98 (L) 101 - 111 mmol/L   CO2 25 22 - 32 mmol/L  Glucose, Bld 118 (H) 65 - 99 mg/dL   BUN 9 6 - 20 mg/dL   Creatinine, Ser 0.49 0.44 - 1.00 mg/dL   Calcium 8.7 (L) 8.9 - 10.3 mg/dL   GFR calc non Af Amer >60 >60 mL/min   GFR calc Af Amer >60 >60 mL/min   Anion gap 9 5 - 15  Lipid panel     Status: Abnormal   Collection Time: 03/19/17  4:56 AM  Result Value Ref Range   Cholesterol 197 0 - 200 mg/dL   Triglycerides 86 <150 mg/dL   HDL 62 >40 mg/dL   Total CHOL/HDL Ratio 3.2 RATIO   VLDL 17 0 - 40 mg/dL   LDL Cholesterol 118 (H) 0 - 99 mg/dL   Ct Angio Head W Or Wo Contrast  Result Date: 03/18/2017 CLINICAL DATA:  Left-sided weakness and facial droop. EXAM: CT ANGIOGRAPHY HEAD AND NECK TECHNIQUE: Multidetector CT imaging of the head and neck was performed using the standard protocol during bolus administration of intravenous contrast. Multiplanar CT image reconstructions and MIPs were obtained to evaluate the vascular anatomy. Carotid stenosis measurements (when applicable) are obtained utilizing NASCET criteria, using the distal internal carotid diameter as the denominator. CONTRAST:  50 mL Isovue 370 COMPARISON:  Head CT today. Head MRI/ MRA 01/04/2014. Soft tissue neck CT 10/12/2007. FINDINGS: CTA NECK FINDINGS Aortic arch: Standard 3 vessel aortic arch with mild  atherosclerotic plaque. Widely patent brachiocephalic and subclavian arteries. Tortuous subclavian arteries. Right carotid system: Widely patent without evidence of stenosis or dissection. Small amount of calcified plaque at the carotid bifurcation and in the distal cervical ICA. Tortuous proximal common carotid artery. Left carotid system: Patent without evidence of stenosis or dissection. Tortuous common and internal carotid arteries. Partially retropharyngeal course of the ICA. Vertebral arteries: Patent without evidence of stenosis or dissection. Dominant left vertebral artery. Skeleton: Moderate cervical disc degeneration. Other neck: No mass or lymph node enlargement. Upper chest: Partially visualize mosaic attenuation in the lung apices which could reflect small vessel or airway disease. Review of the MIP images confirms the above findings CTA HEAD FINDINGS Anterior circulation: The internal carotid arteries are patent from skullbase to carotid termini. There is mild siphon atherosclerosis bilaterally without stenosis. ACAs and MCAs are patent with mild atherosclerotic irregularity but no significant proximal stenosis or proximal branch occlusion. No aneurysm. Posterior circulation: The intracranial vertebral arteries are widely patent to the basilar. The right PICA and left AICA are dominant. Patent SCA origins are seen bilaterally. The basilar artery is widely patent. There is new occlusion of the right PCA at its origin over a length of 4 mm with reconstitution of the more distal P1 segment and beyond. The left PCA is patent without significant proximal stenosis, however there is a new moderate distal left P2 stenosis. Distal PCA branch vessel irregularity and attenuation are noted bilaterally. No aneurysm. Venous sinuses: Patent. Anatomic variants: None. Delayed phase: Not performed. Review of the MIP images confirms the above findings IMPRESSION: 1. New short segment occlusion of the right PCA at its  origin. 2. New severe distal left P2 stenosis. 3. No large vessel occlusion or significant proximal stenosis in the anterior circulation. 4. Widely patent cervical carotid and vertebral arteries. 5.  Aortic Atherosclerosis (ICD10-I70.0). These results were called by telephone at the time of interpretation on 03/18/2017 at 4:22 pm to Dr. Rory Percy, who verbally acknowledged these results. Electronically Signed   By: Logan Bores M.D.   On: 03/18/2017 16:38   Ct  Angio Neck W Or Wo Contrast  Result Date: 03/18/2017 CLINICAL DATA:  Left-sided weakness and facial droop. EXAM: CT ANGIOGRAPHY HEAD AND NECK TECHNIQUE: Multidetector CT imaging of the head and neck was performed using the standard protocol during bolus administration of intravenous contrast. Multiplanar CT image reconstructions and MIPs were obtained to evaluate the vascular anatomy. Carotid stenosis measurements (when applicable) are obtained utilizing NASCET criteria, using the distal internal carotid diameter as the denominator. CONTRAST:  50 mL Isovue 370 COMPARISON:  Head CT today. Head MRI/ MRA 01/04/2014. Soft tissue neck CT 10/12/2007. FINDINGS: CTA NECK FINDINGS Aortic arch: Standard 3 vessel aortic arch with mild atherosclerotic plaque. Widely patent brachiocephalic and subclavian arteries. Tortuous subclavian arteries. Right carotid system: Widely patent without evidence of stenosis or dissection. Small amount of calcified plaque at the carotid bifurcation and in the distal cervical ICA. Tortuous proximal common carotid artery. Left carotid system: Patent without evidence of stenosis or dissection. Tortuous common and internal carotid arteries. Partially retropharyngeal course of the ICA. Vertebral arteries: Patent without evidence of stenosis or dissection. Dominant left vertebral artery. Skeleton: Moderate cervical disc degeneration. Other neck: No mass or lymph node enlargement. Upper chest: Partially visualize mosaic attenuation in the lung  apices which could reflect small vessel or airway disease. Review of the MIP images confirms the above findings CTA HEAD FINDINGS Anterior circulation: The internal carotid arteries are patent from skullbase to carotid termini. There is mild siphon atherosclerosis bilaterally without stenosis. ACAs and MCAs are patent with mild atherosclerotic irregularity but no significant proximal stenosis or proximal branch occlusion. No aneurysm. Posterior circulation: The intracranial vertebral arteries are widely patent to the basilar. The right PICA and left AICA are dominant. Patent SCA origins are seen bilaterally. The basilar artery is widely patent. There is new occlusion of the right PCA at its origin over a length of 4 mm with reconstitution of the more distal P1 segment and beyond. The left PCA is patent without significant proximal stenosis, however there is a new moderate distal left P2 stenosis. Distal PCA branch vessel irregularity and attenuation are noted bilaterally. No aneurysm. Venous sinuses: Patent. Anatomic variants: None. Delayed phase: Not performed. Review of the MIP images confirms the above findings IMPRESSION: 1. New short segment occlusion of the right PCA at its origin. 2. New severe distal left P2 stenosis. 3. No large vessel occlusion or significant proximal stenosis in the anterior circulation. 4. Widely patent cervical carotid and vertebral arteries. 5.  Aortic Atherosclerosis (ICD10-I70.0). These results were called by telephone at the time of interpretation on 03/18/2017 at 4:22 pm to Dr. Rory Percy, who verbally acknowledged these results. Electronically Signed   By: Logan Bores M.D.   On: 03/18/2017 16:38   Mr Brain Wo Contrast  Result Date: 03/18/2017 CLINICAL DATA:  83 y/o F; stroke for follow-up. Left-sided weakness and facial droop. EXAM: MRI HEAD WITHOUT CONTRAST TECHNIQUE: Multiplanar, multiecho pulse sequences of the brain and surrounding structures were obtained without intravenous  contrast. COMPARISON:  03/18/2017 CT head and CT angiogram head. 12/25/2013 MRI head. FINDINGS: Brain: Focus of reduced diffusion spanning 12 mm centered in the right anteromedial midbrain and extending into the right cerebral peduncle (series 3, image 21 and 22). No associated hemorrhage or mass effect. Numerous nonspecific foci of T2 FLAIR hyperintense signal abnormality in subcortical and periventricular white matter are compatible with moderate chronic microvascular ischemic changes for age. Moderate brain parenchymal volume loss. Small chronic cortical infarcts are present in bilateral parietal lobes and the left  frontal lobe. Vascular: Negative. Skull and upper cervical spine: Normal marrow signal. Sinuses/Orbits: Underpneumatized frontal sinuses. Mild mucosal thickening of sphenoid and ethmoid sinuses. Trace effusions and mastoid tips. Left phthisis bulbi. Right intra-ocular lens replacement. Other: None. IMPRESSION: 1. 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 No associated hemorrhage or mass effect. 2. Moderate chronic microvascular ischemic changes and moderate parenchymal volume loss of the brain. Small chronic cortical infarcts are present in bilateral parietal and left frontal lobes. These results will be called to the ordering clinician or representative by the Radiologist Assistant, and communication documented in the PACS or zVision Dashboard. Electronically Signed   By: Kristine Garbe M.D.   On: 03/18/2017 22:14   Ct Head Code Stroke Wo Contrast`  Addendum Date: 03/18/2017   ADDENDUM REPORT: 03/18/2017 16:13 ADDENDUM: These results were called by telephone at the time of interpretation on 03/18/2017 at 4:13 pm to Dr. Rory Percy, who verbally acknowledged these results. Electronically Signed   By: Franchot Gallo M.D.   On: 03/18/2017 16:13   Result Date: 03/18/2017 CLINICAL DATA:  Code stroke.  Left-sided  weakness, facial droop EXAM: CT HEAD WITHOUT CONTRAST TECHNIQUE: Contiguous axial images were obtained from the base of the skull through the vertex without intravenous contrast. COMPARISON:  MRI and CT 01/04/2014 FINDINGS: Brain: Mild atrophy. Negative for hydrocephalus. Patchy white matter hyperintensity bilaterally appears chronic and unchanged. Negative for hemorrhage, acute infarct, or mass lesion. Vascular: Negative for hyperdense vessel Skull: Negative Sinuses/Orbits: Negative Other: None ASPECTS ([place] Stroke Program Early CT Score) - Ganglionic level infarction (caudate, lentiform nuclei, internal capsule, insula, M1-M3 cortex): 7 - Supraganglionic infarction (M4-M6 cortex): 3 Total score (0-10 with 10 being normal): 10 IMPRESSION: 1. No acute abnormality. Atrophy and chronic microvascular ischemia in the white matter 2. ASPECTS is 10 Electronically Signed: By: Franchot Gallo M.D. On: 03/18/2017 15:56    Assessment/Plan: Diagnosis: right anteromedial midbrain Labs and images independently reviewed.  Records reviewed and summated above. Stroke: Continue secondary stroke prophylaxis and Risk Factor Modification listed below:   Antiplatelet therapy:   Blood Pressure Management:  Continue current medication with prn's with permisive HTN per primary team Statin Agent:    1. Does the need for close, 24 hr/day medical supervision in concert with the patient's rehab needs make it unreasonable for this patient to be served in a less intensive setting? Yes  2. Co-Morbidities requiring supervision/potential complications: HTN (monitor and provide prns in accordance with increased physical exertion and pain), SVT (cont to monitor HR with increased physical activity), CAD (cont meds, left eye blindness, hypokalemia (continue to monitor and replete as necessary), hyponatremia (cont to monitor, treat if necessary) 3. Due to bladder management, bowel management, safety, skin/wound care, disease management,  medication administration, pain management and patient education, does the patient require 24 hr/day rehab nursing? Yes 4. Does the patient require coordinated care of a physician, rehab nurse, PT (1-2 hrs/day, 5 days/week), OT (1-2 hrs/day, 5 days/week) and SLP (1-2 hrs/day, 5 days/week) to address physical and functional deficits in the context of the above medical diagnosis(es)? Yes Addressing deficits in the following areas: balance, endurance, locomotion, transferring, bathing, dressing, toileting, cognition, speech, swallowing and psychosocial support 5. Can the patient actively participate in an intensive therapy program of at least 3 hrs of therapy per day at least 5 days per week? Yes 6. The potential for patient to make measurable gains while on inpatient rehab is excellent 7. Anticipated functional outcomes upon discharge  from inpatient rehab are supervision and min assist  with PT, supervision with OT, supervision with SLP. 8. Estimated rehab length of stay to reach the above functional goals is: 20-25 days. 9. Anticipated D/C setting: Home 10. Anticipated post D/C treatments: HH therapy and Home excercise program 11. Overall Rehab/Functional Prognosis: good  RECOMMENDATIONS: This patient's condition is appropriate for continued rehabilitative care in the following setting: Likely CIR.  Will await PT eval. Patient has agreed to participate in recommended program. Yes Note that insurance prior authorization may be required for reimbursement for recommended care.  Comment: Rehab Admissions Coordinator to follow up.  Delice Lesch, MD, ABPMR [place] [place], [place] 03/19/2017

## 2017-03-19 NOTE — Progress Notes (Signed)
  Echocardiogram 2D Echocardiogram has been performed.  Jennette Dubin 03/19/2017, 1:49 PM

## 2017-03-19 NOTE — Telephone Encounter (Signed)
New message   Patient daughter called in  recvd email stating that a device check is needed, patient is in hospital due to stroke. Wants to know if the device can be checked today at the hospital

## 2017-03-19 NOTE — Progress Notes (Signed)
Rehab Admissions Coordinator Note:  Patient was screened by Retta Diones for appropriateness for an Inpatient Acute Rehab Consult.  At this time, we are recommending Inpatient Rehab consult.  Retta Diones 03/19/2017, 10:54 AM  I can be reached at [phone].

## 2017-03-19 NOTE — Plan of Care (Signed)
It seems that the patient has nearly returned to baseline. She has progressed well overnight with me.  She has outstanding family support which will aid in her progression.

## 2017-03-19 NOTE — Evaluation (Signed)
Physical Therapy Evaluation Patient Details Name: [name] [name] MRN: [id number] DOB: 1924/05/08 Today's Date: 03/19/2017   History of Present Illness  83 y.o. 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 CT angio head/neck showed new short segment occlusion of the right PCA at its origin. New severe distal left P2 stenosis.MRI showed: Acute/early subacute infarction centered in right anteromedial midbrain with involvement of right cerebral peduncle.   Clinical Impression  Pt admitted with above diagnosis. Pt is currently limited in her safe mobility by decreased balance and coordination, decreased attention, and decreased safety awareness. Pt is currently modA for bed mobility, sit to stand transfers and 3 feet of R lateral stepping along side of bed. PTA pt lived independently and was able to ambulate community distances without an AD. Pt is motivated to return to her independent lifestyle and would be a good candidate for CIR level therapy. Pt's son and daughter were in the room and are very supportive of rehabilitation to gain maximum functional mobility.        Follow Up Recommendations CIR    Equipment Recommendations  Other (comment)(TBD)    Recommendations for Other Services Rehab consult     Precautions / Restrictions Precautions Precautions: Fall Restrictions Weight Bearing Restrictions: No      Mobility  Bed Mobility Overal bed mobility: Needs Assistance Bed Mobility: Supine to Sit;Sit to Supine     Supine to sit: Mod assist Sit to supine: Mod assist   General bed mobility comments: modA for trunk to upright and for hip scoot to EoB, modA for management of LE into bed, 2x assisted hips, spine and shoulders into alignment and she awkwardly moved LE  to R, stating she felt straight  Transfers Overall transfer level: Needs assistance Equipment used: 1 person hand held assist Transfers: Sit to/from [facility] Sit to Stand: Mod assist         General transfer comment: modA for powerup into standing, difficult with gaining balance due to L posterior lean, when asked whether she felt she was standing up straight pt replied yes.  Ambulation/Gait Ambulation/Gait assistance: Mod assist Ambulation Distance (Feet): 2 Feet Assistive device: 1 person hand held assist Gait Pattern/deviations: Step-to pattern;Decreased step length - right;Decreased step length - left;Leaning posteriorly;Shuffle Gait velocity: slowed Gait velocity interpretation: Below normal speed for age/gender General Gait Details: modA for maintaining balance, vc for sequencing to step toward HoB and maintaining hold of PT for balance,   Modified Rankin (Stroke Patients Only) Modified Rankin (Stroke Patients Only) Pre-Morbid Rankin Score: No symptoms Modified Rankin: Moderately severe disability     Balance Overall balance assessment: Needs assistance Sitting-balance support: Feet supported;Bilateral upper extremity supported Sitting balance-Leahy Scale: Poor   Postural control: Left lateral lean;Posterior lean Standing balance support: Bilateral upper extremity supported Standing balance-Leahy Scale: Poor Standing balance comment: modA for standing balance                             Pertinent Vitals/Pain Pain Assessment: 0-10 Pain Score: 7  Pain Location: headache behind R eye, L arm IV site  Pain Descriptors / Indicators: Aching;Sore;Headache;Guarding Pain Intervention(s): Monitored during session;Limited activity within patient's tolerance    Home Living Family/patient expects to be discharged to::  Private residence Living Arrangements: Alone Available Help at Discharge: Family;Available PRN/intermittently Type of Home:  House Home Access: Stairs to enter   [employer] of Steps: 3 in front, 1 in back Home Layout: One level Home Equipment: None      Prior Function Level of Independence: Independent         Comments: does not drive but independent with all ADL     Hand Dominance   Dominant Hand: Right    Extremity/Trunk Assessment   Upper Extremity Assessment Upper Extremity Assessment: Defer to OT evaluation LUE Deficits / Details: Grossly 4/5, full AROM.     Lower Extremity Assessment Lower Extremity Assessment: LLE deficits/detail;RLE deficits/detail RLE Deficits / Details: AROM WFL, strength grossly 4+/5 LLE Deficits / Details: AROM WFL, strength grossly 4+/5    Cervical / Trunk Assessment Cervical / Trunk Assessment: Kyphotic  Communication   Communication: No difficulties  Cognition Arousal/Alertness: Lethargic Behavior During Therapy: Flat affect Overall Cognitive Status: Impaired/Different from baseline Area of Impairment: Orientation;Memory;Following commands;Safety/judgement;Problem solving                 Orientation Level: Disoriented to;Time;Place;Situation   Memory: Decreased short-term memory Following Commands: Follows one step commands inconsistently;Follows one step commands with increased time Safety/Judgement: Decreased awareness of safety;Decreased awareness of deficits   Problem Solving: Slow processing;Decreased initiation;Difficulty sequencing;Requires verbal cues;Requires tactile cues General Comments: Once pt was settled back in bed she reached for the armrails to get up when asked why she said she needed to work with PT seeming not to remember session that had just happened      General Comments General comments (skin integrity, edema, etc.): daughter, son and daughter present during session     Assessment/Plan    PT Assessment Patient needs continued PT services  PT Problem List Decreased balance;Decreased mobility;Decreased  coordination;Decreased cognition;Decreased safety awareness;Pain       PT Treatment Interventions DME instruction;Gait training;Functional mobility training;Therapeutic activities;Therapeutic exercise;Balance training;Neuromuscular re-education;Cognitive remediation;Patient/family education    PT Goals (Current goals can be found in the Care Plan section)  Acute Rehab PT Goals Patient Stated Goal: return to independence PT Goal Formulation: With patient/family Time For Goal Achievement: 04/02/17 Potential to Achieve Goals: Fair    Frequency Min 4X/week    AM-PAC PT "6 Clicks" Daily Activity  Outcome Measure Difficulty turning over in bed (including adjusting bedclothes, sheets and blankets)?: A Little Difficulty moving from lying on back to sitting on the side of the bed? : Unable Difficulty sitting down on and standing up from a chair with arms (e.g., wheelchair, bedside commode, etc,.)?: Unable Help needed moving to and from a bed to chair (including a wheelchair)?: Total Help needed walking in hospital room?: Total Help needed climbing 3-5 steps with a railing? : Total 6 Click Score: 8    End of Session Equipment Utilized During Treatment: Gait belt Activity Tolerance: Patient limited by pain Patient left: in bed;with call bell/phone within reach;with nursing/sitter in room;with bed alarm set Nurse Communication: Mobility status PT Visit Diagnosis: Unsteadiness on feet (R26.81);Other abnormalities of gait and mobility (R26.89);Muscle weakness (generalized) (M62.81);Difficulty in walking, not elsewhere classified (R26.2);Other symptoms and signs involving the nervous system (J69.678)    Time: 9381-0175 PT Time Calculation (min) (ACUTE ONLY): 27 min   Charges:   PT Evaluation $PT Eval Moderate Complexity: 1 Mod PT Treatments $Therapeutic Activity: 8-22 mins   PT G Codes:        Analeigh Aries B. Migdalia Dk PT, DPT Acute Rehabilitation  [phone]  Pager [phone]    [place] 03/19/2017, 4:27 PM

## 2017-03-19 NOTE — Evaluation (Signed)
Clinical/Bedside Swallow Evaluation Patient Details  Name: [name] [name] MRN: [id number] Date of Birth: 1924/11/03  Today's Date: 03/19/2017 Time: SLP Start Time (ACUTE ONLY): 18 SLP Stop Time (ACUTE ONLY): 1210 SLP Time Calculation (min) (ACUTE ONLY): 40 min  Past Medical History:  Past Medical History:  Diagnosis Date  . Arthritis    "thumbs, joints" (08/07/2015)  . Basal cell carcinoma    "several; scattered over my face, hands, leg some cut off; some burned off"  . Blind left eye    a. Corneal transplant x3 with rejection  . CAD (coronary artery disease)    a. s/p NSTEMI 2015 treated conservatively; nuclear stress test low risk. b. Continued angina despite med rx 07/2015 - s/p io [place] Stent to ramus intermedius with mod LAD stenosis neg by FFR.  Marland Kitchen Chronic right hip pain   . Complication of anesthesia    "very sensitive to RX"  . Diverticulitis large intestine   . Diverticulosis   . Gastritis   . GERD (gastroesophageal reflux disease)   . Hayfever   . Helicobacter pylori (H. pylori) 05/22/02   RUT-Positive  . Hypertension   . Myocardial infarction ([place]) 2015  . Pre-diabetes   . Squamous carcinoma    "several; scattered over my face, hands, leg some cut off; some burned off"  . Stroke [facility]) 2015   denies residual on 08/07/2015  . SVT (supraventricular tachycardia) ([facility])    a. seen by Dr. Caryl Comes - has loop recorder in. Patient has declined amiodarone due to side effect profile  . Varicose veins    Past Surgical History:  Past Surgical History:  Procedure Laterality Date  . BASAL CELL CARCINOMA EXCISION    . CARDIAC CATHETERIZATION N/A 08/07/2015   Procedure: Left Heart Cath and Coronary Angiography;  Surgeon: [name], MD;  Location: [place] [facility];  Service: Cardiovascular;  Laterality: N/A;  . CARDIAC CATHETERIZATION N/A 08/07/2015   Procedure: Coronary Stent Intervention;  Surgeon: [name], MD;  Location: [place] [facility];  Service: Cardiovascular;   Laterality: N/A;  . CARDIAC CATHETERIZATION N/A 08/07/2015   Procedure: Intravascular Pressure Wire/FFR Study;  Surgeon: [name], MD;  Location: [place] [facility];  Service: Cardiovascular;  Laterality: N/A;  . CATARACT EXTRACTION W/ INTRAOCULAR LENS  IMPLANT, BILATERAL Bilateral 2005  . CLOSED REDUCTION SHOULDER DISLOCATION Left 2016 X 2  . CORNEAL TRANSPLANT  right 2009, left 2009   both eyes, 3 in left eye, 1 in right eye  . CORONARY ANGIOPLASTY WITH STENT PLACEMENT  08/07/2015   "1 stent"  . EYE SURGERY    . JOINT REPLACEMENT    . [place]   "had gangrene in it"  . LOOP RECORDER IMPLANT N/A 01/05/2014   Procedure: LOOP RECORDER IMPLANT;  Surgeon: Coralyn Mark, MD;  Location: [place] [facility]ATH [facility];  Service: Cardiovascular;  Laterality: N/A;  . SQUAMOUS CELL CARCINOMA EXCISION    . TONSILLECTOMY    . TOTAL ABDOMINAL HYSTERECTOMY  11/1983   "ovaries and all"  . TOTAL KNEE ARTHROPLASTY Left 06/27/2012   Procedure: LEFT TOTAL KNEE ARTHROPLASTY;  Surgeon: Gearlean Alf, MD;  Location: [facility];  Service: Orthopedics;  Laterality: Left;  . TOTAL KNEE ARTHROPLASTY Right 12/12/2012   Procedure: RIGHT TOTAL KNEE ARTHROPLASTY;  Surgeon: Gearlean Alf, MD;  Location: [facility];  Service: Orthopedics;  Laterality: Right;  . TUBAL LIGATION  1966   HPI:  83 y.o. female with medical history significant for hx of prior CVA, ??Afib/SVT  with implantable loop recorder, CAD, GERD, HTN, Pre-diabetes, blindness in L eye presents to the ED c/o loss of vision in the R eye "like a curtain over the eyes", lip numbness and L finger numbness. Daughter also noted a mild L facial droop and also reported PTA, pt SBP has been >200 at home. Pt took an amlodipine and NTG and was brought to the ED. Pt reported associated headache, denied any extremity weakness, N/V/D/C, fever/chills, chest pain, SOB, abdominal pain. Patient admitted for further management  MRI head 03/18/17 indicated Acute/early  subacute infarction centered in right anteromedial midbrain with involvement of right cerebral peduncle. No associated hemorrhage or mass effect. 2. Moderate chronic microvascular ischemic changes and moderate parenchymal volume loss of the brain. Small chronic cortical infarcts are present in bilateral parietal and left frontal lobes  Assessment / Plan / Recommendation Clinical Impression   Pt with oropharyngeal dysphagia characterized by decreased labial sensation on left, delayed throat clearing (x1, could be d/t xerostomia and/or pt being NPO prior to BSE) and minimally hoarse vocal quality; pt without overt s/s of aspiration consistently (1 incidence of throat clearing, but then resolved), but ST will f/u for diet tolerance and education re: dysphagia management/need for further assessment/objective testing prn. SLP Visit Diagnosis: Dysphagia, oropharyngeal phase (R13.12)    Aspiration Risk  Mild aspiration risk    Diet Recommendation   Dysphagia 3 (mechanical soft; per pt request)/thin liquids  Medication Administration: Whole meds with liquid    Other  Recommendations Oral Care Recommendations: Oral care BID   Follow up Recommendations Other (comment)(TBD)      Frequency and Duration min 2x/week  1 week       Prognosis Prognosis for Safe Diet Advancement: Good      Swallow Study   General Date of Onset: 03/18/17 HPI: 83 y.o. 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 Daughter also noted a mild L facial droop and also reported PTA, pt SBP has been >200 at home. Pt took an amlodipine and NTG and was brought to the ED. Pt reported associated headache, denied any extremity weakness, N/V/D/C, fever/chills, chest pain, SOB, abdominal pain. Patient admitted for  further management  Type of Study: Bedside Swallow Evaluation Previous Swallow Assessment: NSSS passed, but difficulty noted later in hospital stay overnight Diet Prior to this Study: NPO Temperature Spikes Noted: No Respiratory Status: Room air History of Recent Intubation: No Behavior/Cognition: Alert;Cooperative;Pleasant mood Oral Cavity Assessment: Dry Oral Care Completed by SLP: Recent completion by staff Oral Cavity - Dentition: Adequate natural dentition Vision: Functional for self-feeding Self-Feeding Abilities: Able to feed self;Needs assist;Needs set up Patient Positioning: Upright in bed Baseline Vocal Quality: Other (comment)(minimal hoarseness) Volitional Cough: Strong Volitional Swallow: Able to elicit    Oral/Motor/Sensory Function Overall Oral Motor/Sensory Function: Mild impairment Facial ROM: Reduced left;Other (Comment)(very slight) Facial Symmetry: Abnormal symmetry left;Other (Comment)(slight) Facial Strength: Within Functional Limits Facial Sensation: Reduced left(primarily lips per pt) Lingual ROM: Within Functional Limits Lingual Symmetry: Within Functional Limits Lingual Strength: Within Functional Limits Lingual Sensation: Within Functional Limits   Ice Chips Ice chips: Within functional limits Presentation: Spoon   Thin Liquid Thin Liquid: Impaired Presentation: Cup;Spoon Pharyngeal  Phase Impairments: Throat Clearing - Delayed;Other (comments)(only noted once; pt extremely dry when BSE began)  Nectar Thick Nectar Thick Liquid: Not tested   Honey Thick Honey Thick Liquid: Not tested   Puree Puree: Within functional limits Presentation: Self Fed   Solid      Solid: Impaired Presentation: Self Fed Oral Phase Functional Implications: Impaired mastication;Other (comment)(slight; pt prefers food cut for her) Other Comments: Belching noted during BSE        Elvina Sidle, M.S., CCC-SLP 03/19/2017,2:16 PM

## 2017-03-19 NOTE — Evaluation (Addendum)
Speech Language Pathology Evaluation Patient Details Name: [name] [name] MRN: [id number] DOB: [birth year] Today's Date: 03/19/2017 Time: 1761-6073 SLP Time Calculation (min) (ACUTE ONLY): 40 min  Problem List:  Patient Active Problem List   Diagnosis Date Noted  . Stroke ([place]) 03/18/2017  . Other forms of angina pectoris ([place]) 08/07/2015  . Pre-diabetes   . Blind left eye- (Fuch's disease) 03/05/2015  . Diverticulitis 12/19/2014  . UTI (lower urinary tract infection) 12/19/2014  . Essential hypertension 12/19/2014  . CAD (coronary artery disease) 12/19/2014  . Diverticulitis of large intestine without perforation or abscess without bleeding   . History of Rt brain stroke Oct 2015 06/14/2014  . Headache 06/14/2014  . Malignant hypertension 01/20/2014  . Hypertensive urgency, malignant 01/20/2014  . NSTEMI Nov 2015- conservative Rx 01/20/2014  . Chest pain with moderate risk of acute coronary syndrome 01/19/2014  . Aphasia 01/04/2014  . Paresthesia 01/04/2014  . Acute CVA (cerebrovascular accident) ([place]) 01/04/2014  . LLQ abdominal pain 01/01/2014  . Generalized abdominal pain 01/01/2014  . History of diverticulitis 01/01/2014  . Diverticulitis of colon without hemorrhage 12/07/2013  . Diaphoresis 12/29/2012  . Anemia 12/28/2012  . Sepsis ([place]) 12/24/2012  . Cellulitis of leg, right 12/24/2012  . Pyuria 12/24/2012  . Hypoxemia 12/24/2012  . Acute on chronic diastolic heart failure ([place]) 12/24/2012  . Postoperative anemia due to acute blood loss 12/15/2012  . Sleep disturbance 10/03/2012  . Abdominal pulsatile mass 08/24/2012  . Depression 07/12/2012  . OA (osteoarthritis) of knee 06/27/2012  . History of PSVT 12/15/2011   Past Medical History:  Past Medical History:  Diagnosis Date  . Arthritis    "thumbs, joints" (08/07/2015)  . Basal cell carcinoma    "several; scattered over my face, hands, leg some cut off; some burned off"  . Blind left eye    a. Corneal  transplant x3 with rejection  . CAD (coronary artery disease)    a. s/p NSTEMI 2015 treated conservatively; nuclear stress test low risk. b. Continued angina despite med rx 07/2015 - s/p io [place] Stent to ramus intermedius with mod LAD stenosis neg by FFR.  Marland Kitchen Chronic right hip pain   . Complication of anesthesia    "very sensitive to RX"  . Diverticulitis large intestine   . Diverticulosis   . Gastritis   . GERD (gastroesophageal reflux disease)   . Hayfever   . Helicobacter pylori (H. pylori) 05/22/02   RUT-Positive  . Hypertension   . Myocardial infarction ([place]) 2015  . Pre-diabetes   . Squamous carcinoma    "several; scattered over my face, hands, leg some cut off; some burned off"  . Stroke [facility]) 2015   denies residual on 08/07/2015  . SVT (supraventricular tachycardia) (HCC)    a. seen by Dr. Caryl Comes - has loop recorder in. Patient has declined amiodarone due to side effect profile  . Varicose veins    Past Surgical History:  Past Surgical History:  Procedure Laterality Date  . BASAL CELL CARCINOMA EXCISION    . CARDIAC CATHETERIZATION N/A 08/07/2015   Procedure: Left Heart Cath and Coronary Angiography;  Surgeon: [name], MD;  Location: [place] CV LAB;  Service: Cardiovascular;  Laterality: N/A;  . CARDIAC CATHETERIZATION N/A 08/07/2015   Procedure: Coronary Stent Intervention;  Surgeon: [name], MD;  Location: [place] CV LAB;  Service: Cardiovascular;  Laterality: N/A;  . CARDIAC CATHETERIZATION N/A 08/07/2015   Procedure: Intravascular Pressure Wire/FFR Study;  Surgeon: [name], MD;  Location: [place]  CV LAB;  Service: Cardiovascular;  Laterality: N/A;  . CATARACT EXTRACTION W/ INTRAOCULAR LENS  IMPLANT, BILATERAL Bilateral 2005  . CLOSED REDUCTION SHOULDER DISLOCATION Left 2016 X 2  . CORNEAL TRANSPLANT  right 2009, left 2009   both eyes, 3 in left eye, 1 in right eye  . CORONARY ANGIOPLASTY WITH STENT PLACEMENT  08/07/2015   "1 stent"  . EYE  SURGERY    . JOINT REPLACEMENT    . [place]   "had gangrene in it"  . LOOP RECORDER IMPLANT N/A 01/05/2014   Procedure: LOOP RECORDER IMPLANT;  Surgeon: Coralyn Mark, MD;  Location: [place] CATH LAB;  Service: Cardiovascular;  Laterality: N/A;  . SQUAMOUS CELL CARCINOMA EXCISION    . TONSILLECTOMY    . TOTAL ABDOMINAL HYSTERECTOMY  11/1983   "ovaries and all"  . TOTAL KNEE ARTHROPLASTY Left 06/27/2012   Procedure: LEFT TOTAL KNEE ARTHROPLASTY;  Surgeon: Gearlean Alf, MD;  Location: [facility];  Service: Orthopedics;  Laterality: Left;  . TOTAL KNEE ARTHROPLASTY Right 12/12/2012   Procedure: RIGHT TOTAL KNEE ARTHROPLASTY;  Surgeon: Gearlean Alf, MD;  Location: [facility];  Service: Orthopedics;  Laterality: Right;  . TUBAL LIGATION  1966   HPI:  83 y.o. 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 Daughter also noted a mild L facial droop and also reported PTA, pt SBP has been >200 at home. Pt took an amlodipine and NTG and was brought to the ED. Pt reported associated headache, denied any extremity weakness, N/V/D/C, fever/chills, chest pain, SOB, abdominal pain. Patient admitted for further management  MRI head 03/18/17 indicated Acute/early subacute infarction centered in right anteromedial midbrain with involvement of right cerebral peduncle. No associated hemorrhage or mass effect. 2. Moderate chronic microvascular ischemic changes and moderate parenchymal volume loss of the brain. Small chronic cortical infarcts are present in bilateral parietal and left frontal lobes  Assessment / Plan / Recommendation Clinical Impression   Pt administered MOCA (Montreal Cognitive Assessment) which yielded a score of 20/30 with deficits noted in the areas of  pragmatics (flat affect, decreased initiation), memory with delayed recall of new information and STM deficits; decreased organizational and sequential skills during functional tasks; oriented x4 during assessment, but this fluctuates per staff/caregivers.  Decreased problem solving with simple tasks; decreased auditory comprehension with multi-step directives; verbal expression impaired with repetition tasks, but this could be directly related to memory deficits; speech intelligible within conversation; visual deficits impair ability to assess visuospatial/executive function tasks, so this portion of the [facility] was omitted.  ST will f/u while in house for dysphagia management and cognitive tx.    SLP Assessment  SLP Recommendation/Assessment: Patient needs continued Speech Language Pathology Services SLP Visit Diagnosis: Dysphagia, oropharyngeal phase (R13.12);Attention and concentration deficit;Cognitive communication deficit (R41.841) Attention and concentration deficit following: Cerebral infarction    Follow Up Recommendations  Other (comment)(TBD)    Frequency and Duration min 2x/week  1 week      SLP Evaluation Cognition  Overall Cognitive Status: Impaired/Different from baseline Arousal/Alertness: Awake/alert Orientation Level: Oriented X4 Attention: Sustained Sustained Attention: Impaired Sustained Attention Impairment: Verbal basic;Functional basic Memory: Impaired Memory Impairment: Retrieval deficit;Decreased short term memory Decreased Short Term Memory: Verbal basic;Functional basic Awareness: Appears intact Problem Solving: Impaired Problem  Solving Impairment: Verbal basic;Functional basic Executive Function: Sequencing;Organizing Sequencing: Impaired Sequencing Impairment: Verbal basic;Functional basic Organizing: Impaired Organizing Impairment: Verbal basic;Functional basic Safety/Judgment: Appears intact       Comprehension  Auditory Comprehension Overall  Auditory Comprehension: Impaired Yes/No Questions: Within Functional Limits Commands: Impaired Multistep Basic Commands: 25-49% accurate Conversation: Complex Interfering Components: Pain;Processing speed EffectiveTechniques: Extra processing time;Repetition Visual Recognition/Discrimination Discrimination: Not tested Reading Comprehension Reading Status: Unable to assess (comment)(Pt with left eye blindness; impaired with right)    Expression Expression Primary Mode of Expression: Verbal Verbal Expression Overall Verbal Expression: Appears within functional limits for tasks assessed Initiation: Impaired Level of Generative/Spontaneous Verbalization: Conversation Repetition: Impaired Level of Impairment: Sentence level Naming: Impairment Responsive: 76-100% accurate Confrontation: Impaired Convergent: 25-49% accurate Divergent: 25-49% accurate Verbal Errors: Perseveration Pragmatics: Impairment Impairments: Abnormal affect Non-Verbal Means of Communication: Not applicable Written Expression Dominant Hand: Right Written Expression: Unable to assess (comment)(visual changes/baseline visual deficits)   Oral / Motor  Oral Motor/Sensory Function Overall Oral Motor/Sensory Function: Mild impairment Facial ROM: Reduced left;Other (Comment)(very slight) Facial Symmetry: Abnormal symmetry left;Other (Comment)(slight) Facial Strength: Within Functional Limits Facial Sensation: Reduced left(primarily lips per pt) Lingual ROM: Within Functional Limits Lingual Symmetry: Within Functional Limits Lingual Strength: Within Functional Limits Lingual Sensation: Within Functional Limits Motor Speech Overall Motor Speech: Appears within functional limits for tasks assessed Respiration: Within functional limits Phonation: Other (comment)(minimal hoarseness) Resonance: Within functional limits Articulation: Within functional limitis Intelligibility: Intelligible Motor Planning: Witnin  functional limits Motor Speech Errors: Not applicable                      Elvina Sidle, M.S., CCC-SLP 03/19/2017, 2:35 PM

## 2017-03-19 NOTE — Progress Notes (Signed)
Carotid duplex prelim: 1-39% bilat. Landry Mellow, RDMS, RVT

## 2017-03-19 NOTE — Progress Notes (Signed)
NEUROHOSPITALISTS STROKE TEAM - DAILY PROGRESS NOTE   ADMISSION HISTORY:  [name] [name] is a 83 y.o. female who has a past medical history of a prior stroke with no residual deficits, SVT-loop recorder in place for concern of paroxysmal A. fib since her last stroke in 2015 but no documented A. fib, blindness of the left eye, hypertension, who was in her usual state of health until about 3:15 PM, when her daughter came to help her sit and she noticed that she cannot see out of the right eye, which is her good eye and has limited vision from.  She also noted that her left lip both upper lip and lower lip had become numb.  At that time, she also reported that both are ears felt very full.  She was brought in to the hospital for concern of stroke because the daughter also appreciated a mild left facial asymmetry. She has had one stroke in the past that had affected her speech.  She had a left MCA territory stroke.  Workup was done and was essentially unremarkable but there was high suspicion for paroxysmal atrial fibrillation, for which she was implanted with a implantable loop recorder-no documented A. fib L date. Also of note, upon her arrival and since this morning, her systolic blood pressures have been in the 200s.  She took an amlodipine and 1 dose of nitroglycerin at home as advised by her cardiologist for these high blood pressures.  Her usual blood pressure ranges in the 140 range. She denies any speech problems.  Denies headaches.  Denies any other tingling and numbness other than described above.  Denies nausea and vomiting although she was a little nauseous as I was interviewing her. She denies any preceding fevers or chills.  Denies chest pain or shortness of breath.  Denies nausea vomiting.  Denies abdominal pain.  LKW: 1515 hrs, 03/18/2017 tpa given?: no, mild symptoms Premorbid modified Rankin scale (mRS): 3 NIHSS - 2 (1 for sensory, 1 for  facial)  SUBJECTIVE (INTERVAL HISTORY) Son is at the bedside. Patient is found laying in bed in NAD. Overall she feels her condition is gradually improving. Voices no new complaints. No new events reported overnight.  Continues to complain of some left hand numbness on exam today  OBJECTIVE Lab Results: CBC:  Recent Labs  Lab 03/18/17 1545 03/18/17 1550 03/19/17 0456  WBC 5.0  --  5.5  HGB 15.2* 16.3* 14.3  HCT 47.1* 48.0* 43.2  MCV 98.5  --  95.8  PLT 180  --  170   BMP: Recent Labs  Lab 03/18/17 1545 03/18/17 1550 03/19/17 0456  NA 138 137 132*  K 3.7 4.0 3.3*  CL 101 99* 98*  CO2 24  --  25  GLUCOSE 165* 166* 118*  BUN 16 23* 9  CREATININE 0.74 0.80 0.49  CALCIUM 9.2  --  8.7*   Liver Function Tests:  Recent Labs  Lab 03/18/17 1545  AST 33  ALT 24  ALKPHOS 105  BILITOT 0.5  PROT 7.1  ALBUMIN 4.0   Coagulation Studies:  Recent Labs    03/18/17 1545  APTT 26  INR 0.93   PHYSICAL EXAM Temp:  [97.6 F (36.4 C)-98.7 F (37.1 C)] 97.6 F (36.4 C) (12/28 1030) Pulse Rate:  [64-88] 70 (12/28 1030) Resp:  [14-20] 20 (12/28 1030) BP: (138-201)/(55-93) 184/71 (12/28 1030) SpO2:  [93 %-99 %] 99 % (12/28 0900) General - Well nourished, well developed, in no apparent distress Respiratory -  Lungs clear bilaterally. No wheezing. Cardiovascular - Regular rate and rhythm  Neurological exam Mental status: Awake alert oriented x3. Language: Speech is clear.  Naming, fluency, repetition and comprehension intact. Cranial nerves: Left corneal opacity, right pupil 3 mm and very sluggishly reactive, visual acuity limited to finger counting, able to count fingers in all 4 quadrants with the right eye, subtle drooping of the left angle of the mouth, subjective numbness of the left upper and lower lip, auditory acuity grossly reduced bilaterally, palate elevates at midline, shoulder shrug intact, tongue midline. Motor exam: 5/5 in all 4 extremities. Tone: is normal and  bulk is normal Sensation- Intact to light touch on all 4 limbs.  Mild decreased sensation on the left upper and lower lip. Coordination: FTN intact bilaterally Gait- deferred  IMAGING: I have personally reviewed the radiological images below and agree with the radiology interpretations.  Ct Angio Head and neck W Or Wo Contrast Result Date: 03/18/2017 IMPRESSION: 1. New short segment occlusion of the right PCA at its origin. 2. New severe distal left P2 stenosis. 3. No large vessel occlusion or significant proximal stenosis in the anterior circulation. 4. Widely patent cervical carotid and vertebral arteries. 5.  Aortic Atherosclerosis (ICD10-I70.0).   Mr Brain Wo Contrast Result Date: 03/18/2017 IMPRESSION: 1. 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 No associated hemorrhage or mass effect. 2. Moderate chronic microvascular ischemic changes and moderate parenchymal volume loss of the brain. Small chronic cortical infarcts are present in bilateral parietal and left frontal lobes.   Ct Head Code Stroke Wo Contrast` Result Date: 03/18/2017 IMPRESSION: 1. No acute abnormality. Atrophy and chronic microvascular ischemia in the white matter 2. ASPECTS is 10   Echocardiogram: not done          PENDING  B/L Carotid U/S:       1-39% bilat     ASSESSMENT: [name] [name] is a 83 y.o. female with PMH of prior stroke with no residual deficits, SVT with a loop recorder in place for concern of paroxysmal A. fib but no documentation of A. fib since her last stroke in 2015, blindness of the left eye, and hypertension who was brought in for concern of stroke when she started having sudden onset of right eye blindness which has since resolved and left facial numbness and weakness. She has a low stroke scale of 2, hence TPA was not offered.She has no LVO that explains her symptoms, hence not a candidate for endovascular.  MRI  reveals:  Acute/early subacute infarction centered in right anteromedial midbrain with involvement of right cerebral peduncle.  STROKE:  Suspected Etiology: Hypertensive urgency versus small vessel disease   Resultant Symptoms: Transient right eye blindness, left facial numbness/weakness. Stroke Risk Factors: hyperlipidemia and hypertension Other Stroke Risk Factors: Advanced age, Hx stroke, CAD, SVT, Hx MI  Outstanding Stroke Work-up Studies:     Echocardiogram: not done               PENDING  03/19/2017: Neuro exam remained stable.  Patient continues to complain of some mild numbness in left hand.  All imaging labs and plan of care reviewed with patient and her son at bedside.  We did interrogate loop recorder  But found no evidence for atrial fibrillation since 09/10/2016 last check.  PLAN  03/19/2017: Continue Plavix/ Statin Frequent neuro checks Telemetry monitoring PT/OT/SLP Consult PM & Rehab Consult Case Management /MSW Ongoing aggressive stroke risk factor management Patient counseled to  be compliant with herantithrombotic medications Patient counseled on Lifestyle modifications including, Diet, Exercise, and Stress Follow up with [facility] in 6 weeks  HX OF STROKES: Small chronic cortical infarcts are present in bilateral parietal and left frontal lobes.   INTRACRANIAL Atherosclerosis &Stenosis: On DAPT at admission  AFIB, CHRONIC: Loop Recorder in progress, will interrogate loop recorder for atrial fibrillation Follow-up with Dr. Cooper/Cardiology Possible Plavix failure, continue Plavix for now.   HYPERTENSION: Stable, some elevated blood pressures noted overnight Permissive hypertension (OK if <220/120) for 24-48 hours post stroke and then gradually normalized within 5-7 days. Long term BP goal normotensive. May slowly restart home B/P medications after 48 hours  HYPERLIPIDEMIA:    Component Value Date/Time   CHOL 197 03/19/2017 0456    TRIG 86 03/19/2017 0456   HDL 62 03/19/2017 0456   CHOLHDL 3.2 03/19/2017 0456   VLDL 17 03/19/2017 0456   LDLCALC 118 (H) 03/19/2017 0456  Home Meds:  NONE LDL  goal < 70 Started on Lipitor to 40 mg daily Continue statin at discharge  R/O DIABETES: Lab Results  Component Value Date   HGBA1C 5.3 03/19/2017  HgbA1c goal < 7.0  Other Active Problems: Principal Problem:   Stroke [facility]) Active Problems:   History of PSVT   OA (osteoarthritis) of knee   Depression   History of Rt brain stroke Oct 2015   Essential hypertension   Blind left eye- (Fuch's disease)  Hospital day # 1 VTE prophylaxis: Lovenox  Diet : DIET DYS 3 Room service appropriate? Yes; Fluid consistency: Thin   FAMILY UPDATES:  family at bedside  TEAM UPDATES: Cristal Ford, DO     Prior Home Stroke Medications:  clopidogrel 75 mg daily  Discharge Stroke Meds:  Please discharge patient on clopidogrel 75 mg daily and Lipitor 40 mg   Disposition: 01-Home or Self Care Therapy Recs:               CIR Home Equipment:         TBD Follow Up:  Follow-up Information    Garvin Fila, MD. Schedule an appointment as soon as possible for a visit in 6 week(s).   Specialties:  Neurology, Radiology Contact information: [address] [place] [address] [phone]          Darcus Austin, MD -PCP Follow up in 1-2 weeks    [facility] O[facility] Stroke Neurology Team 03/19/2017 1:39 PM I have personally examined this patient, reviewed notes, independently viewed imaging studies, participated in medical decision making and plan of care.ROS completed by me personally and pertinent positives fully documented  I have made any additions or clarifications directly to the above note. Agree with note above. She has prior history of cryptogenic strokes and has a loop recorder which was interrogated and did not show paroxysmal A. fib. Present MRI scan shows a lacunar infarct in the midbrain likely from  small vessel disease. Continue antiplatelet therapy and aggressive risk factor modification. Long discussion with patient and son and answered questions. Greater than 50% time during this 35 minute visit was spent on counseling and coordination of care about her stroke and answered questions  Antony Contras, MD Medical Director [place] Pager: [phone] 03/19/2017 2:35 PM To contact Stroke Continuity provider, please refer to [email or web address] After hours, contact General Neurology

## 2017-03-20 DIAGNOSIS — I6302 Cerebral infarction due to thrombosis of basilar artery: Secondary | ICD-10-CM

## 2017-03-20 LAB — BASIC METABOLIC PANEL
Anion gap: 10 (ref 5–15)
BUN: 10 mg/dL (ref 6–20)
CALCIUM: 8.9 mg/dL (ref 8.9–10.3)
CO2: 26 mmol/L (ref 22–32)
CREATININE: 0.53 mg/dL (ref 0.44–1.00)
Chloride: 93 mmol/L — ABNORMAL LOW (ref 101–111)
GLUCOSE: 114 mg/dL — AB (ref 65–99)
Potassium: 4.5 mmol/L (ref 3.5–5.1)
Sodium: 129 mmol/L — ABNORMAL LOW (ref 135–145)

## 2017-03-20 MED ORDER — ONDANSETRON HCL 4 MG/2ML IJ SOLN
4.0000 mg | Freq: Once | INTRAMUSCULAR | Status: AC
  Administered 2017-03-20: 4 mg via INTRAVENOUS
  Filled 2017-03-20: qty 2

## 2017-03-20 MED ORDER — KETOROLAC TROMETHAMINE 15 MG/ML IJ SOLN
15.0000 mg | Freq: Once | INTRAMUSCULAR | Status: AC
Start: 2017-03-20 — End: 2017-03-20
  Administered 2017-03-20: 15 mg via INTRAVENOUS
  Filled 2017-03-20: qty 1

## 2017-03-20 MED ORDER — BUTALBITAL-APAP-CAFFEINE 50-325-40 MG PO TABS
1.0000 | ORAL_TABLET | ORAL | Status: DC | PRN
  Administered 2017-03-20 (×2): 1 via ORAL
  Filled 2017-03-20 (×2): qty 1

## 2017-03-20 NOTE — Progress Notes (Addendum)
NEUROHOSPITALISTS STROKE TEAM - DAILY PROGRESS NOTE   SUBJECTIVE (INTERVAL HISTORY) [name] [name] is at the bedside. Patient is sitting in chair in NAD.  Left facial droop and left lip numbness resolved.  MRI showed right midbrain infarct as well as remote cortical infarcts and left frontal and bilateral parietal.  Put on DAPT for 3 weeks  OBJECTIVE Lab Results: CBC:  Recent Labs  Lab 03/18/17 1545 03/18/17 1550 03/19/17 0456  WBC 5.0  --  5.5  HGB 15.2* 16.3* 14.3  HCT 47.1* 48.0* 43.2  MCV 98.5  --  95.8  PLT 180  --  170   BMP: Recent Labs  Lab 03/18/17 1545 03/18/17 1550 03/19/17 0456 03/20/17 0403  NA 138 137 132* 129*  K 3.7 4.0 3.3* 4.5  CL 101 99* 98* 93*  CO2 24  --  25 26  GLUCOSE 165* 166* 118* 114*  BUN 16 23* 9 10  CREATININE 0.74 0.80 0.49 0.53  CALCIUM 9.2  --  8.7* 8.9   Liver Function Tests:  Recent Labs  Lab 03/18/17 1545  AST 33  ALT 24  ALKPHOS 105  BILITOT 0.5  PROT 7.1  ALBUMIN 4.0   Coagulation Studies:  Recent Labs    03/18/17 1545  APTT 26  INR 0.93   PHYSICAL EXAM Temp:  [98 F (36.7 C)-99.4 F (37.4 C)] 99.4 F (37.4 C) (12/29 1019) Pulse Rate:  [67-75] 75 (12/29 1019) Resp:  [16-20] 18 (12/29 1019) BP: (146-170)/(55-76) 146/60 (12/29 1019) SpO2:  [95 %-100 %] 98 % (12/29 1019) General - Well nourished, well developed, in no apparent distress Respiratory - Lungs clear bilaterally. No wheezing. Cardiovascular - Regular rate and rhythm  Neurological exam Mental status: Awake alert oriented x3. Language: Speech is clear.  Naming, fluency, repetition and comprehension intact. Cranial nerves: Left corneal opacity with only light perception, right pupil 3 mm and reactive, visual acuity limited to finger counting. However, left hemianopia from right eye. Bilateral eyes incomplete abduction, auditory grossly reduced bilaterally, palate elevates at midline, shoulder shrug intact, tongue  midline. Motor exam: 5/5 in all 4 extremities. Tone: is normal and bulk is normal Sensation- Intact to light touch on all 4 limbs.  Mild decreased sensation on the left upper and lower lip. Coordination: FTN intact bilaterally Gait- deferred  IMAGING: I have personally reviewed the radiological images below and agree with the radiology interpretations.  Ct Angio Head and neck W Or Wo Contrast Result Date: 03/18/2017 IMPRESSION:  1. New short segment occlusion of the right PCA at its origin.  2. New severe distal left P2 stenosis.  3. No large vessel occlusion or significant proximal stenosis in the anterior circulation.  4. Widely patent cervical carotid and vertebral arteries.  5.  Aortic Atherosclerosis (ICD10-I70.0).   Mr Brain Wo Contrast Result Date: 03/18/2017 IMPRESSION:  1. 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 No associated hemorrhage or mass effect.  2. Moderate chronic microvascular ischemic changes and moderate parenchymal volume loss of the brain. Small chronic cortical infarcts are present in bilateral parietal and left frontal lobes.   Ct Head Code Stroke Wo Contrast` Result Date: 03/18/2017 IMPRESSION:  1. No acute abnormality. Atrophy and chronic microvascular ischemia in the white matter  2. ASPECTS is 10   Echocardiogram:  - Left ventricle: The cavity size was normal. Systolic function was   normal. The estimated ejection fraction was in the range of 55%   to 60%. Left ventricular  diastolic function parameters were   normal. - Aortic valve: Mildly thickened, moderately calcified leaflets.   There was mild regurgitation. - Mitral valve: There was mild regurgitation. - Left atrium: The atrium was normal in size. - Right ventricle: The cavity size was normal. Wa wall ll thickness was   normal. Systolic function was normal. - Right atrium: The atrium was normal in size. - Tricuspid  valve: There was mild regurgitation. - Pulmonic valve: There was no regurgitation. - Pericardium, extracardiac: The pericardium was normal in   appearance. Impressions: - No cardiac source of emboli was indentified.  B/L Carotid U/S: 1-39% bilat ICA stenosis      ASSESSMENT: [name] is a 83 y.o. female with PMH of prior stroke with no residual deficits, SVT with a loop recorder in place for concern of paroxysmal A. fib but no documentation of A. fib since her last stroke in 2015, blindness of the left eye, and hypertension who was brought in for concern of stroke when she started having sudden onset of right eye blindness which has since resolved and left facial numbness and weakness. She has a low stroke scale of 2, hence TPA was not offered.She has no LVO that explains her symptoms, hence not a candidate for endovascular.    MRI Brain Wo Contrast 03/18/2017 Acute/early subacute infarction centered in right anteromedial midbrain with involvement of right cerebral peduncle.  STROKE:  Suspected Etiology: Hypertensive urgency versus small vessel disease   Resultant Symptoms: Transient right eye blindness, left facial numbness/weakness. Stroke Risk Factors: hyperlipidemia and hypertension Other Stroke Risk Factors: Advanced age, Hx stroke, CAD, SVT, Hx MI  Outstanding Stroke Work-up Studies:     Echocardiogram: EF 55-60%. No cardiac source of emboli identified.  03/20/2017: Neuro exam remained stable.  Patient continues to complain of some mild numbness in left hand.  All imaging labs and plan of care reviewed with patient and her [name] [name] at bedside.  We did interrogate loop recorder  But found no evidence for atrial fibrillation since 09/10/2016 last check.  PLAN  03/20/2017: Continue Plavix/ Statin Frequent neuro checks Telemetry monitoring PT/OT/SLP Consult PM & Rehab Consult Case Management /MSW Ongoing aggressive stroke risk factor management Patient counseled to be  compliant with herantithrombotic medications Patient counseled on Lifestyle modifications including, Diet, Exercise, and Stress Follow up with [facility] in 6 weeks  HX OF STROKES: Small chronic cortical infarcts are present in bilateral parietal and left frontal lobes.   INTRACRANIAL Atherosclerosis &Stenosis: On DAPT at admission  AFIB, CHRONIC: Loop Recorder in progress, will interrogate loop recorder for atrial fibrillation Follow-up with Dr. Cooper/Cardiology Possible Plavix failure, continue Plavix for now.   HYPERTENSION: Stable, some elevated blood pressures noted overnight Permissive hypertension (OK if <220/120) for 24-48 hours post stroke and then gradually normalized within 5-7 days. Long term BP goal normotensive. May slowly restart home B/P medications after 48 hours  HYPERLIPIDEMIA:    Component Value Date/Time   CHOL 197 03/19/2017 0456   TRIG 86 03/19/2017 0456   HDL 62 03/19/2017 0456   CHOLHDL 3.2 03/19/2017 0456   VLDL 17 03/19/2017 0456   LDLCALC 118 (H) 03/19/2017 0456  Home Meds:  NONE LDL  goal < 70 Started on Lipitor to 40 mg daily Continue statin at discharge  R/O DIABETES: Lab Results  Component Value Date   HGBA1C 5.3 03/19/2017  HgbA1c goal < 7.0  Other Active Problems: Principal Problem:   Stroke [facility]) Active Problems:   History of PSVT  OA (osteoarthritis) of knee   Depression   History of Rt brain stroke Oct 2015   Essential hypertension   Blind left eye- (Fuch's disease)   Stroke-like symptoms   Benign essential HTN   History of supraventricular tachycardia   Hypokalemia   Hyponatremia Elevated hemoglobin and hematocrit   Hospital day # 2 VTE prophylaxis: Lovenox  Diet : DIET DYS 3 Room service appropriate? Yes; Fluid consistency: Thin   FAMILY UPDATES:  family at bedside  TEAM UPDATES: Cristal Ford, DO     Prior Home Stroke Medications:  clopidogrel 75 mg daily  Discharge Stroke Meds:  Please  discharge patient on clopidogrel 75 mg daily and Lipitor 40 mg   Disposition: 01-Home or Self Care Therapy Recs:               CIR Home Equipment:         TBD Follow Up:  Follow-up Information    Garvin Fila, MD. Schedule an appointment as soon as possible for a visit in 6 week(s).   Specialties:  Neurology, Radiology Contact information: [address] [place] [address] [phone]          Darcus Austin, MD -PCP Follow up in 1-2 weeks     Attending note: I reviewed above note and agree with the assessment and plan. I have made any additions or clarifications directly to the above note. Pt was seen and examined.   83 year old female with history of hypertension, SVT, CHF, left eye legally blind admitted for left facial droop and left lip numbness.  CTA head and neck right PCA occlusion and left P2 stenosis.  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  LDL 118, A1c 5.3.  EF 55-60%.  Given stroke location and intracranial vascular stenosis, recommend DAPT for 3 weeks and then Plavix alone.  Continue Lipitor 40. PT/OT recommend CIR.   She had stroke in 12/2013 with right arm weakness MRI showed left high frontal cortex infarct.  MRA showed right PCA moderate stenosis.  Carotid Doppler, TTE, DVT negative.  LDL 91 elevated A1c 6.0.  Loop recorder placed.  In 01/2015, she was found to have A. fib lasting 10 seconds, however frequent intermittent SVTs.  She follows Dr. Burt Knack for SVT management.   Neurology will sign off. Please call with questions. Pt will follow up with Dr. Jannifer Franklin at [facility] in about 6 weeks.  Thanks for the consult.   Rosalin Hawking, MD PhD Stroke Neurology 03/20/2017 10:49 PM     To contact Stroke Continuity provider, please refer to [email or web address] After hours, contact General Neurology

## 2017-03-20 NOTE — Progress Notes (Signed)
Physical Therapy Treatment Patient Details Name: [name] [name] MRN: [id number] DOB: January 11, 1925 Today's Date: 03/20/2017    History of Present Illness 83 y.o. 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 CT angio head/neck showed new short segment occlusion of the right PCA at its origin. New severe distal left P2 stenosis.MRI showed: Acute/early subacute infarction centered in right anteromedial midbrain with involvement of right cerebral peduncle.     PT Comments    Patient progressing this session.  Able to ambulate in hallway, but noted poor L side awareness, decreased balance and poor deficit awareness.  She will have assist available at d/c as son reports she can stay with his sister.  Continue to recommend CIR level rehab.   Follow Up Recommendations  CIR     Equipment Recommendations  Other (comment)(TBA)    Recommendations for Other Services Rehab consult     Precautions / Restrictions Precautions Precautions: Fall Precaution Comments: L eye blindness    Mobility  Bed Mobility               General bed mobility comments: up on [facility] with RN  Transfers Overall transfer level: Needs assistance Equipment used: Rolling walker (2 wheeled) Transfers: Sit to/from Stand Sit to Stand: Mod assist         General transfer comment: lifting assist from [facility] and from EOB  Ambulation/Gait Ambulation/Gait assistance: Min assist;Mod assist Ambulation Distance (Feet): 60 Feet Assistive device: Rolling walker (2 wheeled) Gait Pattern/deviations: Step-to pattern;Decreased step length - left;Decreased stride length;Wide base of support     General Gait Details: assist for placing L hand on walker, assist to keep walker close, assist and increased time  for turns and to maneuver safely around obstacles due to decreased vision on L and decreased L awareness   Stairs            Wheelchair Mobility    Modified Rankin (Stroke Patients Only) Modified Rankin (Stroke Patients Only) Pre-Morbid Rankin Score: No symptoms Modified Rankin: Moderately severe disability     Balance Overall balance assessment: Needs assistance Sitting-balance support: Feet supported Sitting balance-Leahy Scale: Poor Sitting balance - Comments: leaning to L in sitting at EOB, son supporing Postural control: Posterior lean;Left lateral lean Standing balance support: Bilateral upper extremity supported Standing balance-Leahy Scale: Poor Standing balance comment: min A for balance with UE support                            Cognition Arousal/Alertness: Awake/alert Behavior During Therapy: WFL for tasks assessed/performed   Area of Impairment: Problem solving;Safety/judgement;Memory                     Memory: Decreased short-term memory Following Commands: Follows multi-step commands with increased time;Follows multi-step commands inconsistently Safety/Judgement: Decreased awareness of deficits   Problem Solving: Decreased initiation;Requires verbal cues;Slow processing        Exercises      General Comments General comments (skin integrity, edema, etc.): son present during session      Pertinent Vitals/Pain Faces Pain Scale: Hurts even more Pain Location: headache behind R eye Pain Descriptors / Indicators: Headache;Aching;Sore Pain Intervention(s): Monitored during session;Other (comment);Repositioned(cool cloth applied)    Home Living  Prior Function            PT Goals (current goals can now be found in the care plan section) Progress towards PT goals: Progressing toward goals    Frequency    Min 4X/week      PT Plan Current plan remains appropriate    Co-evaluation               AM-PAC PT "6 Clicks" Daily Activity  Outcome Measure  Difficulty turning over in bed (including adjusting bedclothes, sheets and blankets)?: A Little Difficulty moving from lying on back to sitting on the side of the bed? : Unable   Help needed moving to and from a bed to chair (including a wheelchair)?: A Lot Help needed walking in hospital room?: A Lot Help needed climbing 3-5 steps with a railing? : A Lot 6 Click Score: 10    End of Session Equipment Utilized During Treatment: Gait belt Activity Tolerance: Patient limited by pain Patient left: in chair;with call bell/phone within reach;with chair alarm set;with family/visitor present   PT Visit Diagnosis: Unsteadiness on feet (R26.81);Other abnormalities of gait and mobility (R26.89);Muscle weakness (generalized) (M62.81);Difficulty in walking, not elsewhere classified (R26.2);Other symptoms and signs involving the nervous system (R29.898)     Time: 1135-1200 PT Time Calculation (min) (ACUTE ONLY): 25 min  Charges:  $Gait Training: 8-22 mins $Therapeutic Activity: 8-22 mins                    G CodesMagda Kiel, [place] [phone] 03/20/2017    Reginia Naas 03/20/2017, 12:08 PM

## 2017-03-20 NOTE — Progress Notes (Signed)
PROGRESS NOTE    [name] [name]  PPI:[id number] DOB: [birth year] DOA: 03/18/2017 PCP: Darcus Austin, MD   No chief complaint on file.    Brief Narrative:  83 year old female history of prior CVA, possible atrial fibrillation versus SVT with implantable loop recorder, CAD, GERD, hypertension, diabetes who presented with right eye vision loss, left finger numbness and lip numbness. Patient admitted for CVA.  Assessment & Plan   Acute CVA -CT head showed no acute abnormality -MRI brain showed acute/early subacute infarction centered in the right anterior medial midbrain with involvement of the right cerebral peduncle. -CTA head: New short segment occlusion of the right PCA. No severe distal left P2 stenosis. No large vessel occlusion or shaking proximal stenosis in the anterior circulation. -LDL 118, hemoglobin A1c 5.3 -Echocardiogram 06-30%, LV diastolic function parameters were normal. No cardiac source of emboli. -carotid doppler 1-39% B/L ICA stenosis. Vertebral arteries patent with antegrade flow. -Continue statin, Plavix -Neurology consulted and appreciated -Discussed with Dr. Leonie Man, no documentation of AF since last stroke in 2015 -PT/OT recommended CIR -CIR consulted- potential candidate  Essential hypertension -Allow for permissive hypertension  History of SVT versus atrial fibrillation -Patient has loop recorder implanted -discussed with neurology, loop interrogated, no PAF  CAD -no complaints of chest pain -Continue Plavix, statin  GERD  -Continue H2 blocker   Fuch's disease -patient with left eye blindness  -continue eye drops  Hypokalemia -Replaced, continue to monitor  Headache  -possibly related to the above -will order fioricet   DVT Prophylaxis  lovenox  Code Status:  Full  Family Communication: Family at bedside  Disposition Plan: Admitted. Pending CIR   Consultants Neurology Inpatient rehab  Procedures  Echcoardiogram Carotid  doppler  Antibiotics   Anti-infectives (From admission, onward)   None      Subjective:   [name] [name] seen and examined today.  Complains of headache. Denies chest pain, shortness of breath, abdominal pain, N/V/D/C.   Objective:   Vitals:   03/19/17 1853 03/19/17 2130 03/20/17 0508 03/20/17 1019  BP: (!) 162/76 (!) 165/55 (!) 158/69 (!) 146/60  Pulse: 71 67 68 75  Resp: 16 18 18 18   Temp: 98.4 F (36.9 C)  98.4 F (36.9 C) 99.4 F (37.4 C)  TempSrc: Oral  Oral Oral  SpO2: 98% 100% 98% 98%    Intake/Output Summary (Last 24 hours) at 03/20/2017 1110 Last data filed at 03/20/2017 0514 Gross per 24 hour  Intake 120 ml  Output 400 ml  Net -280 ml   There were no vitals filed for this visit.  Exam  General: Well developed, elderly, NAD  HEENT: NCAT, mucous membranes moist.   Cardiovascular: S1 S2 auscultated, no murmurs, RRR  Respiratory: Clear to auscultation bilaterally with equal chest rise, no wheezing  Abdomen: Soft, nontender, nondistended, + bowel sounds  Extremities: warm dry without cyanosis clubbing or edema  Neuro: AAOx3, left side weakness, otherwise nonfocal (history of left sided blindness)  Psych: Appropriate mood and affect, pleasant   Data Reviewed: I have personally reviewed following labs and imaging studies  CBC: Recent Labs  Lab 03/18/17 1545 03/18/17 1550 03/19/17 0456  WBC 5.0  --  5.5  NEUTROABS 2.5  --  3.6  HGB 15.2* 16.3* 14.3  HCT 47.1* 48.0* 43.2  MCV 98.5  --  95.8  PLT 180  --  160   Basic Metabolic Panel: Recent Labs  Lab 03/18/17 1545 03/18/17 1550 03/19/17 0456 03/20/17 0403  NA 138 137 132* 129*  K 3.7  4.0 3.3* 4.5  CL 101 99* 98* 93*  CO2 24  --  25 26  GLUCOSE 165* 166* 118* 114*  BUN 16 23* 9 10  CREATININE 0.74 0.80 0.49 0.53  CALCIUM 9.2  --  8.7* 8.9   GFR: CrCl cannot be calculated (Unknown ideal weight.). Liver Function Tests: Recent Labs  Lab 03/18/17 1545  AST 33  ALT 24  ALKPHOS 105   BILITOT 0.5  PROT 7.1  ALBUMIN 4.0   No results for input(s): LIPASE, AMYLASE in the last 168 hours. No results for input(s): AMMONIA in the last 168 hours. Coagulation Profile: Recent Labs  Lab 03/18/17 1545  INR 0.93   Cardiac Enzymes: No results for input(s): CKTOTAL, CKMB, CKMBINDEX, TROPONINI in the last 168 hours. BNP (last 3 results) No results for input(s): PROBNP in the last 8760 hours. HbA1C: Recent Labs    03/19/17 0456  HGBA1C 5.3   CBG: No results for input(s): GLUCAP in the last 168 hours. Lipid Profile: Recent Labs    03/19/17 0456  CHOL 197  HDL 62  LDLCALC 118*  TRIG 86  CHOLHDL 3.2   Thyroid Function Tests: No results for input(s): TSH, T4TOTAL, FREET4, T3FREE, THYROIDAB in the last 72 hours. Anemia Panel: No results for input(s): VITAMINB12, FOLATE, FERRITIN, TIBC, IRON, RETICCTPCT in the last 72 hours. Urine analysis:    Component Value Date/Time   COLORURINE YELLOW 12/19/2014 1830   APPEARANCEUR CLOUDY (A) 12/19/2014 1830   LABSPEC 1.011 12/19/2014 1830   PHURINE 6.0 12/19/2014 1830   GLUCOSEU NEGATIVE 12/19/2014 1830   HGBUR NEGATIVE 12/19/2014 1830   BILIRUBINUR NEGATIVE 12/19/2014 1830   KETONESUR NEGATIVE 12/19/2014 1830   PROTEINUR NEGATIVE 12/19/2014 1830   UROBILINOGEN 0.2 12/19/2014 1830   NITRITE NEGATIVE 12/19/2014 1830   LEUKOCYTESUR LARGE (A) 12/19/2014 1830   Sepsis Labs: @LABRCNTIP (procalcitonin:4,lacticidven:4)  )No results found for this or any previous visit (from the past 240 hour(s)).    Radiology Studies: Ct Angio Head W Or Wo Contrast  Result Date: 03/18/2017 CLINICAL DATA:  Left-sided weakness and facial droop. EXAM: CT ANGIOGRAPHY HEAD AND NECK TECHNIQUE: Multidetector CT imaging of the head and neck was performed using the standard protocol during bolus administration of intravenous contrast. Multiplanar CT image reconstructions and MIPs were obtained to evaluate the vascular anatomy. Carotid stenosis  measurements (when applicable) are obtained utilizing NASCET criteria, using the distal internal carotid diameter as the denominator. CONTRAST:  50 mL Isovue 370 COMPARISON:  Head CT today. Head MRI/ MRA 01/04/2014. Soft tissue neck CT 10/12/2007. FINDINGS: CTA NECK FINDINGS Aortic arch: Standard 3 vessel aortic arch with mild atherosclerotic plaque. Widely patent brachiocephalic and subclavian arteries. Tortuous subclavian arteries. Right carotid system: Widely patent without evidence of stenosis or dissection. Small amount of calcified plaque at the carotid bifurcation and in the distal cervical ICA. Tortuous proximal common carotid artery. Left carotid system: Patent without evidence of stenosis or dissection. Tortuous common and internal carotid arteries. Partially retropharyngeal course of the ICA. Vertebral arteries: Patent without evidence of stenosis or dissection. Dominant left vertebral artery. Skeleton: Moderate cervical disc degeneration. Other neck: No mass or lymph node enlargement. Upper chest: Partially visualize mosaic attenuation in the lung apices which could reflect small vessel or airway disease. Review of the MIP images confirms the above findings CTA HEAD FINDINGS Anterior circulation: The internal carotid arteries are patent from skullbase to carotid termini. There is mild siphon atherosclerosis bilaterally without stenosis. ACAs and MCAs are patent with mild atherosclerotic irregularity but  no significant proximal stenosis or proximal branch occlusion. No aneurysm. Posterior circulation: The intracranial vertebral arteries are widely patent to the basilar. The right PICA and left AICA are dominant. Patent SCA origins are seen bilaterally. The basilar artery is widely patent. There is new occlusion of the right PCA at its origin over a length of 4 mm with reconstitution of the more distal P1 segment and beyond. The left PCA is patent without significant proximal stenosis, however there is a  new moderate distal left P2 stenosis. Distal PCA branch vessel irregularity and attenuation are noted bilaterally. No aneurysm. Venous sinuses: Patent. Anatomic variants: None. Delayed phase: Not performed. Review of the MIP images confirms the above findings IMPRESSION: 1. New short segment occlusion of the right PCA at its origin. 2. New severe distal left P2 stenosis. 3. No large vessel occlusion or significant proximal stenosis in the anterior circulation. 4. Widely patent cervical carotid and vertebral arteries. 5.  Aortic Atherosclerosis (ICD10-I70.0). These results were called by telephone at the time of interpretation on 03/18/2017 at 4:22 pm to Dr. Rory Percy, who verbally acknowledged these results. Electronically Signed   By: Logan Bores M.D.   On: 03/18/2017 16:38   Ct Angio Neck W Or Wo Contrast  Result Date: 03/18/2017 CLINICAL DATA:  Left-sided weakness and facial droop. EXAM: CT ANGIOGRAPHY HEAD AND NECK TECHNIQUE: Multidetector CT imaging of the head and neck was performed using the standard protocol during bolus administration of intravenous contrast. Multiplanar CT image reconstructions and MIPs were obtained to evaluate the vascular anatomy. Carotid stenosis measurements (when applicable) are obtained utilizing NASCET criteria, using the distal internal carotid diameter as the denominator. CONTRAST:  50 mL Isovue 370 COMPARISON:  Head CT today. Head MRI/ MRA 01/04/2014. Soft tissue neck CT 10/12/2007. FINDINGS: CTA NECK FINDINGS Aortic arch: Standard 3 vessel aortic arch with mild atherosclerotic plaque. Widely patent brachiocephalic and subclavian arteries. Tortuous subclavian arteries. Right carotid system: Widely patent without evidence of stenosis or dissection. Small amount of calcified plaque at the carotid bifurcation and in the distal cervical ICA. Tortuous proximal common carotid artery. Left carotid system: Patent without evidence of stenosis or dissection. Tortuous common and internal  carotid arteries. Partially retropharyngeal course of the ICA. Vertebral arteries: Patent without evidence of stenosis or dissection. Dominant left vertebral artery. Skeleton: Moderate cervical disc degeneration. Other neck: No mass or lymph node enlargement. Upper chest: Partially visualize mosaic attenuation in the lung apices which could reflect small vessel or airway disease. Review of the MIP images confirms the above findings CTA HEAD FINDINGS Anterior circulation: The internal carotid arteries are patent from skullbase to carotid termini. There is mild siphon atherosclerosis bilaterally without stenosis. ACAs and MCAs are patent with mild atherosclerotic irregularity but no significant proximal stenosis or proximal branch occlusion. No aneurysm. Posterior circulation: The intracranial vertebral arteries are widely patent to the basilar. The right PICA and left AICA are dominant. Patent SCA origins are seen bilaterally. The basilar artery is widely patent. There is new occlusion of the right PCA at its origin over a length of 4 mm with reconstitution of the more distal P1 segment and beyond. The left PCA is patent without significant proximal stenosis, however there is a new moderate distal left P2 stenosis. Distal PCA branch vessel irregularity and attenuation are noted bilaterally. No aneurysm. Venous sinuses: Patent. Anatomic variants: None. Delayed phase: Not performed. Review of the MIP images confirms the above findings IMPRESSION: 1. New short segment occlusion of the right PCA at its  origin. 2. New severe distal left P2 stenosis. 3. No large vessel occlusion or significant proximal stenosis in the anterior circulation. 4. Widely patent cervical carotid and vertebral arteries. 5.  Aortic Atherosclerosis (ICD10-I70.0). These results were called by telephone at the time of interpretation on 03/18/2017 at 4:22 pm to Dr. Rory Percy, who verbally acknowledged these results. Electronically Signed   By: Logan Bores M.D.   On: 03/18/2017 16:38   Mr Brain Wo Contrast  Result Date: 03/18/2017 CLINICAL DATA:  83 y/o F; stroke for follow-up. Left-sided weakness and facial droop. EXAM: MRI HEAD WITHOUT CONTRAST TECHNIQUE: Multiplanar, multiecho pulse sequences of the brain and surrounding structures were obtained without intravenous contrast. COMPARISON:  03/18/2017 CT head and CT angiogram head. 12/25/2013 MRI head. FINDINGS: Brain: Focus of reduced diffusion spanning 12 mm centered in the right anteromedial midbrain and extending into the right cerebral peduncle (series 3, image 21 and 22). No associated hemorrhage or mass effect. Numerous nonspecific foci of T2 FLAIR hyperintense signal abnormality in subcortical and periventricular white matter are compatible with moderate chronic microvascular ischemic changes for age. Moderate brain parenchymal volume loss. Small chronic cortical infarcts are present in bilateral parietal lobes and the left frontal lobe. Vascular: Negative. Skull and upper cervical spine: Normal marrow signal. Sinuses/Orbits: Underpneumatized frontal sinuses. Mild mucosal thickening of sphenoid and ethmoid sinuses. Trace effusions and mastoid tips. Left phthisis bulbi. Right intra-ocular lens replacement. Other: None. IMPRESSION: 1. 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 No associated hemorrhage or mass effect. 2. Moderate chronic microvascular ischemic changes and moderate parenchymal volume loss of the brain. Small chronic cortical infarcts are present in bilateral parietal and left frontal lobes. These results will be called to the ordering clinician or representative by the Radiologist Assistant, and communication documented in the PACS or zVision Dashboard. Electronically Signed   By: Kristine Garbe M.D.   On: 03/18/2017 22:14   Ct Head Code Stroke Wo Contrast`  Addendum Date: 03/18/2017   ADDENDUM  REPORT: 03/18/2017 16:13 ADDENDUM: These results were called by telephone at the time of interpretation on 03/18/2017 at 4:13 pm to Dr. Rory Percy, who verbally acknowledged these results. Electronically Signed   By: Franchot Gallo M.D.   On: 03/18/2017 16:13   Result Date: 03/18/2017 CLINICAL DATA:  Code stroke.  Left-sided weakness, facial droop EXAM: CT HEAD WITHOUT CONTRAST TECHNIQUE: Contiguous axial images were obtained from the base of the skull through the vertex without intravenous contrast. COMPARISON:  MRI and CT 01/04/2014 FINDINGS: Brain: Mild atrophy. Negative for hydrocephalus. Patchy white matter hyperintensity bilaterally appears chronic and unchanged. Negative for hemorrhage, acute infarct, or mass lesion. Vascular: Negative for hyperdense vessel Skull: Negative Sinuses/Orbits: Negative Other: None ASPECTS ([place] Stroke Program Early CT Score) - Ganglionic level infarction (caudate, lentiform nuclei, internal capsule, insula, M1-M3 cortex): 7 - Supraganglionic infarction (M4-M6 cortex): 3 Total score (0-10 with 10 being normal): 10 IMPRESSION: 1. No acute abnormality. Atrophy and chronic microvascular ischemia in the white matter 2. ASPECTS is 10 Electronically Signed: By: Franchot Gallo M.D. On: 03/18/2017 15:56     Scheduled Meds: . artificial tears  1 application Both Eyes QHS  . aspirin EC  81 mg Oral Daily  . atorvastatin  40 mg Oral q1800  . clopidogrel  75 mg Oral Daily  . enoxaparin (LOVENOX) injection  40 mg Subcutaneous Q24H  . famotidine  20 mg Oral QHS  . multivitamin  1 tablet Oral QPC breakfast  .  prednisoLONE acetate  1 drop Both Eyes QHS   Continuous Infusions:   LOS: 2 days   Time Spent in minutes   30 minutes  Abdiel Blackerby D.O. on 03/20/2017 at 11:10 AM  Between 7am to 7pm - Pager - [phone]  After 7pm go to [email or web address] - password [email or web address]  And look for the night coverage person covering for me after hours  [facility] Office   [phone]

## 2017-03-20 NOTE — Plan of Care (Signed)
Pt resting quietly in bed; c/o HA and nausea earlier, MD notified and orders received for IV zofran and toradol. NAD now.

## 2017-03-21 LAB — BASIC METABOLIC PANEL
ANION GAP: 8 (ref 5–15)
BUN: 17 mg/dL (ref 6–20)
CALCIUM: 8.8 mg/dL — AB (ref 8.9–10.3)
CO2: 25 mmol/L (ref 22–32)
Chloride: 100 mmol/L — ABNORMAL LOW (ref 101–111)
Creatinine, Ser: 0.79 mg/dL (ref 0.44–1.00)
Glucose, Bld: 95 mg/dL (ref 65–99)
Potassium: 3.8 mmol/L (ref 3.5–5.1)
Sodium: 133 mmol/L — ABNORMAL LOW (ref 135–145)

## 2017-03-21 NOTE — Progress Notes (Signed)
PROGRESS NOTE    [name] [name]  RXV:[id number] DOB: Mar 30, 1924 DOA: 03/18/2017 PCP: Darcus Austin, MD   No chief complaint on file.    Brief Narrative:  83 year old female history of prior CVA, possible atrial fibrillation versus SVT with implantable loop recorder, CAD, GERD, hypertension, diabetes who presented with right eye vision loss, left finger numbness and lip numbness. Patient admitted for CVA. Pending CIR  Assessment & Plan   Acute CVA -CT head showed no acute abnormality -MRI brain showed acute/early subacute infarction centered in the right anterior medial midbrain with involvement of the right cerebral peduncle. -CTA head: New short segment occlusion of the right PCA. No severe distal left P2 stenosis. No large vessel occlusion or shaking proximal stenosis in the anterior circulation. -LDL 118, hemoglobin A1c 5.3 -Echocardiogram 50-93%, LV diastolic function parameters were normal. No cardiac source of emboli. -carotid doppler 1-39% B/L ICA stenosis. Vertebral arteries patent with antegrade flow. -Continue statin, Plavix -Neurology consulted and appreciated- continue DAPT for 3 weeks, then plavix alone thereafter, continue statin. Follow up with Dr. Jannifer Franklin. -Discussed with Dr. Leonie Man, no documentation of AF since last stroke in 2015 -PT/OT recommended CIR -CIR consulted- potential candidate -will also consult SW for SNF  Essential hypertension -Allow for permissive hypertension  History of SVT versus atrial fibrillation -Patient has loop recorder implanted -discussed with neurology, loop interrogated, no PAF currently. Had 10 second seconds of AF in Nov 2016. Has had frequent intermittent SVTs.  -patient will need to follow up with Dr. Burt Knack, cardiologist  CAD -no complaints of chest pain -Continue Plavix, statin  GERD  -Continue H2 blocker   Fuch's disease -patient with left eye blindness  -continue eye drops  Hypokalemia -resolved, continue to monitor  BMP periodically  Headache  -possibly related to the above -No longer complaining of headache -continue Fioricet PRN  DVT Prophylaxis  lovenox  Code Status:  Full  Family Communication: Family at bedside  Disposition Plan: Admitted. Pending CIR   Consultants Neurology Inpatient rehab  Procedures  Echcoardiogram Carotid doppler  Antibiotics   Anti-infectives (From admission, onward)   None      Subjective:   [name] [name] seen and examined today.  No complaints today. Denies chest pain, shortness of breath, abdominal pain, N/V/D/C.   Objective:   Vitals:   03/20/17 1709 03/20/17 2157 03/21/17 0241 03/21/17 0533  BP: (!) 161/72 (!) 154/71 (!) 120/53 (!) 133/52  Pulse: 73 73 69 69  Resp: 18 18 18    Temp: 99.4 F (37.4 C) 99.5 F (37.5 C)  99.1 F (37.3 C)  TempSrc: Oral Oral  Oral  SpO2: 99% 94% 95% 94%    Intake/Output Summary (Last 24 hours) at 03/21/2017 0949 Last data filed at 03/20/2017 1700 Gross per 24 hour  Intake 560 ml  Output -  Net 560 ml   There were no vitals filed for this visit.  Exam (no change in exam from 03/20/2017)  General: Well developed, elderly, NAD  HEENT: NCAT, mucous membranes moist.   Cardiovascular: S1 S2 auscultated, no murmurs, RRR  Respiratory: Clear to auscultation bilaterally with equal chest rise, no wheezing  Abdomen: Soft, nontender, nondistended, + bowel sounds  Extremities: warm dry without cyanosis clubbing or edema  Neuro: AAOx3, left side weakness, otherwise nonfocal (history of left sided blindness)  Psych: Appropriate mood and affect, pleasant   Data Reviewed: I have personally reviewed following labs and imaging studies  CBC: Recent Labs  Lab 03/18/17 1545 03/18/17 1550 03/19/17 0456  WBC 5.0  --  5.5  NEUTROABS 2.5  --  3.6  HGB 15.2* 16.3* 14.3  HCT 47.1* 48.0* 43.2  MCV 98.5  --  95.8  PLT 180  --  876   Basic Metabolic Panel: Recent Labs  Lab 03/18/17 1545 03/18/17 1550  03/19/17 0456 03/20/17 0403 03/21/17 0409  NA 138 137 132* 129* 133*  K 3.7 4.0 3.3* 4.5 3.8  CL 101 99* 98* 93* 100*  CO2 24  --  25 26 25   GLUCOSE 165* 166* 118* 114* 95  BUN 16 23* 9 10 17   CREATININE 0.74 0.80 0.49 0.53 0.79  CALCIUM 9.2  --  8.7* 8.9 8.8*   GFR: CrCl cannot be calculated (Unknown ideal weight.). Liver Function Tests: Recent Labs  Lab 03/18/17 1545  AST 33  ALT 24  ALKPHOS 105  BILITOT 0.5  PROT 7.1  ALBUMIN 4.0   No results for input(s): LIPASE, AMYLASE in the last 168 hours. No results for input(s): AMMONIA in the last 168 hours. Coagulation Profile: Recent Labs  Lab 03/18/17 1545  INR 0.93   Cardiac Enzymes: No results for input(s): CKTOTAL, CKMB, CKMBINDEX, TROPONINI in the last 168 hours. BNP (last 3 results) No results for input(s): PROBNP in the last 8760 hours. HbA1C: Recent Labs    03/19/17 0456  HGBA1C 5.3   CBG: No results for input(s): GLUCAP in the last 168 hours. Lipid Profile: Recent Labs    03/19/17 0456  CHOL 197  HDL 62  LDLCALC 118*  TRIG 86  CHOLHDL 3.2   Thyroid Function Tests: No results for input(s): TSH, T4TOTAL, FREET4, T3FREE, THYROIDAB in the last 72 hours. Anemia Panel: No results for input(s): VITAMINB12, FOLATE, FERRITIN, TIBC, IRON, RETICCTPCT in the last 72 hours. Urine analysis:    Component Value Date/Time   COLORURINE YELLOW 12/19/2014 1830   APPEARANCEUR CLOUDY (A) 12/19/2014 1830   LABSPEC 1.011 12/19/2014 1830   PHURINE 6.0 12/19/2014 1830   GLUCOSEU NEGATIVE 12/19/2014 1830   HGBUR NEGATIVE 12/19/2014 1830   BILIRUBINUR NEGATIVE 12/19/2014 1830   KETONESUR NEGATIVE 12/19/2014 1830   PROTEINUR NEGATIVE 12/19/2014 1830   UROBILINOGEN 0.2 12/19/2014 1830   NITRITE NEGATIVE 12/19/2014 1830   LEUKOCYTESUR LARGE (A) 12/19/2014 1830   Sepsis Labs: @LABRCNTIP (procalcitonin:4,lacticidven:4)  )No results found for this or any previous visit (from the past 240 hour(s)).    Radiology  Studies: No results found.   Scheduled Meds: . artificial tears  1 application Both Eyes QHS  . aspirin EC  81 mg Oral Daily  . atorvastatin  40 mg Oral q1800  . clopidogrel  75 mg Oral Daily  . enoxaparin (LOVENOX) injection  40 mg Subcutaneous Q24H  . famotidine  20 mg Oral QHS  . multivitamin  1 tablet Oral QPC breakfast  . prednisoLONE acetate  1 drop Both Eyes QHS   Continuous Infusions:   LOS: 3 days   Time Spent in minutes   30 minutes  Cassady Turano D.O. on 03/21/2017 at 9:49 AM  Between 7am to 7pm - Pager - [phone]  After 7pm go to [email or web address] - password [email or web address]  And look for the night coverage person covering for me after hours  [facility] Office  [phone]

## 2017-03-22 NOTE — Progress Notes (Signed)
Occupational Therapy Treatment Patient Details Name: [name] [name] MRN: [id number] DOB: September 22, 1924 Today's Date: 03/22/2017    History of present illness 83 y.o. 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 CT angio head/neck showed new short segment occlusion of the right PCA at its origin. New severe distal left P2 stenosis.MRI showed: Acute/early subacute infarction centered in right anteromedial midbrain with involvement of right cerebral peduncle.    OT comments  Pt progressing towards established OT goals. Pt continues to demonstrate visual deficits with L visual field cut. Pt requiring cues throughout session to attend and locate items on L side of visual field during ADLs. With visual deficits, pt is at risk for falls. Pt highly motivate to return to PLOF and participate in therapy. Continue to recommend dc to CIR to optimize pt safety and independence. Will continue to follow acutely.    Follow Up Recommendations  CIR;Supervision/Assistance - 24 hour    Equipment Recommendations  Other (comment)(TBD at next venue)    Recommendations for Other Services PT consult;Rehab consult    Precautions / Restrictions Precautions Precautions: Fall Precaution Comments: L eye blindness Restrictions Weight Bearing Restrictions: No       Mobility Bed Mobility Overal bed mobility: Needs Assistance       Supine to sit: Min guard;HOB elevated     General bed mobility comments: Min guard for safety and HOB elevated  Transfers Overall transfer level: Needs assistance Equipment used: Rolling walker (2 wheeled) Transfers: Sit to/from Stand Sit to Stand: Min assist         General transfer comment: minA for steadying in standing with RW    Balance Overall  balance assessment: Needs assistance Sitting-balance support: Feet supported;Bilateral upper extremity supported Sitting balance-Leahy Scale: Fair   Postural control: Left lateral lean;Posterior lean Standing balance support: Bilateral upper extremity supported Standing balance-Leahy Scale: Fair Standing balance comment: able to achieve static standing with no UE support                           ADL either performed or assessed with clinical judgement   ADL Overall ADL's : Needs assistance/impaired Eating/Feeding: Minimal assistance;Sitting;Cueing for sequencing;Cueing for safety;Cueing for compensatory techinques Eating/Feeding Details (indicate cue type and reason): Pt requiring cues thorughout meal prep task to attend to L side of table  Grooming: Sitting;Min guard;Cueing for sequencing;Minimal assistance;Oral care;Brushing hair;Wash/dry face Grooming Details (indicate cue type and reason): Pt requiring cues throughout grooming to locate items on L side of sick. With cues, pt is able to locate items and cross midline.             Lower Body Dressing: Minimal assistance;Sit to/from stand Lower Body Dressing Details (indicate cue type and reason): Pt donning pants and shoes with Min A while sitting at EOB. Min A for balance and safety             Functional mobility during ADLs: Minimal assistance;Rolling walker General ADL Comments: Pt demonstrating increased functional performance. However, contineus to be a fall risk due to visual deficits and balance. Eduated pt and daughter on compensatory tehcniques for low vision and field cut deficits (i.e. anchors to L, contrast colors, and lighting).     Vision   Vision Assessment?:  Yes Eye Alignment: Within Functional Limits Visual Fields: Impaired-to be further tested in functional context;Other (comment)(Pt requiring L visual field cut and not attending to L side) Additional Comments: Pt with L visual field cut and  requiring cues throughout session to attend to L side of visual field. Pt also with L eye blindness further impacting deficits   Perception     Praxis      Cognition Arousal/Alertness: Lethargic;Awake/alert Behavior During Therapy: WFL for tasks assessed/performed Overall Cognitive Status: Impaired/Different from baseline Area of Impairment: Following commands;Safety/judgement;Problem solving                       Following Commands: Follows one step commands with increased time Safety/Judgement: Decreased awareness of safety;Decreased awareness of deficits   Problem Solving: Slow processing;Decreased initiation;Difficulty sequencing;Requires verbal cues;Requires tactile cues General Comments: Pt requiring increased time and cues for problem solving and attending to L side of visual field. Pt demonstrating slower processing than baseline         Exercises     Shoulder Instructions       General Comments daughter present throughout session    Pertinent Vitals/ Pain       Pain Assessment: No/denies pain  Home Living                                          Prior Functioning/Environment              Frequency  Min 2X/week        Progress Toward Goals  OT Goals(current goals can now be found in the care plan section)  Progress towards OT goals: Progressing toward goals  Acute Rehab OT Goals Patient Stated Goal: return to independence OT Goal Formulation: With patient/family Time For Goal Achievement: 04/02/17 Potential to Achieve Goals: Good ADL Goals Pt Will Perform Grooming: with supervision;sitting Pt Will Perform Upper Body Bathing: with supervision;sitting Pt Will Perform Lower Body Bathing: with min assist;sit to/from stand Pt Will Transfer to Toilet: with min assist;ambulating;bedside commode Pt Will Perform Toileting - Clothing Manipulation and hygiene: with min assist;sit to/from stand  Plan Discharge plan remains  appropriate    Co-evaluation                 AM-PAC PT "6 Clicks" Daily Activity     Outcome Measure   Help from another person eating meals?: A Little Help from another person taking care of personal grooming?: A Little Help from another person toileting, which includes using toliet, bedpan, or urinal?: A Lot Help from another person bathing (including washing, rinsing, drying)?: A Lot Help from another person to put on and taking off regular upper body clothing?: A Lot Help from another person to put on and taking off regular lower body clothing?: A Little 6 Click Score: 15    End of Session Equipment Utilized During Treatment: Rolling walker;Gait belt  OT Visit Diagnosis: Unsteadiness on feet (R26.81);Other abnormalities of gait and mobility (R26.89);Muscle weakness (generalized) (M62.81);Low vision, both eyes (H54.2);Hemiplegia and hemiparesis Hemiplegia - Right/Left: Left Hemiplegia - dominant/non-dominant: Non-Dominant Hemiplegia - caused by: Cerebral infarction   Activity Tolerance Patient tolerated treatment well   Patient Left with call bell/phone within reach;with family/visitor present;in chair   Nurse Communication Mobility status        Time: 2694-8546 OT Time Calculation (min): 36 min  Charges: OT General Charges $OT  Visit: 1 Visit OT Treatments $Self Care/Home Management : 23-37 mins  [place], OTR/L Acute Rehab Pager: [phone] Office: [place] 03/22/2017, 5:09 PM

## 2017-03-22 NOTE — Progress Notes (Addendum)
Rehab admissions - I have opened the case with [employer] requesting acute inpatient rehab admission. I met with patient and her daughter and gave them rehab booklets.  They are very interested in inpatient rehab admission.   I will await response from insurance case manager.  I do have beds on inpatient rehab if we hear back from insurance carrier later today.  Call me for questions.  #[phone]

## 2017-03-22 NOTE — Progress Notes (Signed)
SLP Cancellation Note  Patient Details Name: Roderick Sweezy MRN: [id number] DOB: Dec 26, 1924   Cancelled treatment:       Reason Eval/Treat Not Completed: Other (comment);Patient at procedure or test/unavailable(pt working with Pt at this time, will continue efforts)   Luanna Salk, [place] SLP [phone]

## 2017-03-22 NOTE — Care Management Important Message (Signed)
Important Message  Patient Details  Name: [name] [name] MRN: [id number] Date of Birth: 1924/10/14   Medicare Important Message Given:  Yes    [name] Montine Circle 03/22/2017, 1:54 PM

## 2017-03-22 NOTE — PMR Pre-admission (Signed)
PMR Admission Coordinator Pre-Admission Assessment  Patient: [name] [name] is an 83 y.o., female MRN: [id number] DOB: 1925/01/09 Height: 5'2" Weight:  171 lbs             Insurance Information HMO:      PPO:       PCP:       IPA:       80/20:       OTHER:   PRIMARY: [employer]      Policy#: [id number]      Subscriber: patient CM Name: Christinia Gully      Phone#: [phone]     Fax#: [phone] Pre-Cert#: [id number] for 10 days approved      Employer: Retired Benefits:  Phone #: [phone]     Name:  Lavena Stanford. Date: 03/23/17     Deduct: $0      Out of Pocket Max: $3400     Life Max: N/A CIR: $295 days 1-6       SNF:  $20 days 1-20; $160 days 21-160  Outpatient:  Medical necessity     Co-Pay: $15/session Home Health: 100%      Co-Pay: none DME: 80%     Co-Pay: 20% Providers: in network  Emergency Contact Information Contact Information    Name Relation Home Work Mobile   [place] Daughter   [phone]   Ronnika, Collett   [phone]   Lianne Moris Daughter   [phone]   [name],Mike Relative   [phone]     Current Medical History  Patient Admitting Diagnosis: Right anteromedial midbrain infarct   History of Present Illness: A 83 y.o.femalewith history of HTN, SVT, CAD, Fuch's disease with left eye blindness and decreased vision right eye, blance deficits; who was admitted on 03/18/17 with sudden loss of vision in right eye, left facial numbness and elevated BP.History taken from chart review and family.CTA head/neck showed new short segment occlusion of R-PCA at origin with new severe distal left P2 stenosis. Vision improved to baseline and tPA not given. MRI brainreviewed, showing right brainstem infarct. Per report, acute/early subacute infarct in right anteromedial midbrain with involvement of right cerebral peduncle and moderate chronic small vessel disease with moderate parenchymal brain volume loss.  Dr. Leonie Man recommended DAPT for stroke due  to SVD v/s HTN emergency. Loop recorder interrogated-- no episodic a fib but has had frequent intermittent SVTs.  Patient with resultant  balance deficits, left inattention, cognitive deficits with deficits in problem solving functional tasks as well as poor safety awareness. CIR recommended due to functional deficits.  Total: 5=NIH  Past Medical History  Past Medical History:  Diagnosis Date  . Arthritis    "thumbs, joints" (03/23/2017  . Basal cell carcinoma    "several; scattered over my face, hands, leg some cut off; some burned off"  . Blind left eye    a. Corneal transplant x3 with rejection  . CAD (coronary artery disease)    a. s/p NSTEMI 2015 treated conservatively; nuclear stress test low risk. b. Continued angina despite med rx 07/2015 - s/p io [place] Stent to ramus intermedius with mod LAD stenosis neg by FFR.  . Complication of anesthesia    "very sensitive to RX"  . Diverticulitis large intestine   . Diverticulosis   . Family history of adverse reaction to anesthesia    "we all get PONV" (1/1/201)  . Gastritis   . GERD (gastroesophageal reflux disease)   . Hayfever   . Helicobacter pylori (H. pylori) 05/22/02   RUT-Positive  .  Hypertension   . Myocardial infarction ([place]) 2015  . Squamous carcinoma    "several; scattered over my face, hands, leg some cut off; some burned off"  . Stroke [facility]) 2015   denies residual on 08/07/2015  . Stroke [facility]) 03/18/2017   "has effected her vision, balance, some memory" (03/23/2017)  . SVT (supraventricular tachycardia) (HCC)    a. seen by Dr. Caryl Comes - has loop recorder in. Patient has declined amiodarone due to side effect profile  . Varicose veins     Family History  family history includes Cancer in her brother; Heart attack in her brother, brother, brother, brother, and father; Parkinsonism in her brother; Stroke in her mother.  Prior Rehab/Hospitalizations: No recent rehab.  Has the patient had major surgery during 100 days  prior to admission? No  Current Medications   Current Facility-Administered Medications:  .  acetaminophen (TYLENOL) tablet 650 mg, 650 mg, Oral, Q4H PRN, 650 mg at 03/23/17 0020 **OR** acetaminophen (TYLENOL) solution 650 mg, 650 mg, Per Tube, Q4H PRN **OR** acetaminophen (TYLENOL) suppository 650 mg, 650 mg, Rectal, Q4H PRN, Alma Friendly, MD .  alum & mag hydroxide-simeth (MAALOX/MYLANTA) 200-200-20 MG/5ML suspension 30 mL, 30 mL, Oral, Q6H PRN, Schorr, Rhetta Mura, NP, 30 mL at 03/24/17 0058 .  artificial tears (LACRILUBE) ophthalmic ointment 1 application, 1 application, Both Eyes, QHS, Alma Friendly, MD, 1 application at 14/78/29 2136 .  aspirin EC tablet 81 mg, 81 mg, Oral, Daily, Rosalin Hawking, MD, 81 mg at 03/24/17 1035 .  atorvastatin (LIPITOR) tablet 40 mg, 40 mg, Oral, q1800, Alma Friendly, MD, 40 mg at 03/23/17 1717 .  butalbital-acetaminophen-caffeine (FIORICET, ESGIC) 50-325-40 MG per tablet 1 tablet, 1 tablet, Oral, Q4H PRN, Cristal Ford, DO, 1 tablet at 03/20/17 2136 .  clopidogrel (PLAVIX) tablet 75 mg, 75 mg, Oral, Daily, Alma Friendly, MD, 75 mg at 03/24/17 1035 .  enoxaparin (LOVENOX) injection 40 mg, 40 mg, Subcutaneous, Q24H, Alma Friendly, MD, 40 mg at 03/23/17 2120 .  famotidine (PEPCID) tablet 20 mg, 20 mg, Oral, QHS, Mikhail, [place], DO, 20 mg at 03/23/17 2121 .  labetalol (NORMODYNE,TRANDATE) injection 10 mg, 10 mg, Intravenous, Q8H PRN, Alma Friendly, MD .  morphine 2 MG/ML injection 1 mg, 1 mg, Intravenous, Q4H PRN, Cristal Ford, DO, 1 mg at 03/19/17 1701 .  multivitamin (PROSIGHT) tablet 1 tablet, 1 tablet, Oral, QPC breakfast, Alma Friendly, MD, 1 tablet at 03/24/17 0800 .  polyvinyl alcohol (LIQUIFILM TEARS) 1.4 % ophthalmic solution 1 drop, 1 drop, Both Eyes, BID PRN, Alma Friendly, MD, 1 drop at 03/21/17 2257 .  prednisoLONE acetate (PRED FORTE) 1 % ophthalmic suspension 1 drop, 1 drop, Both Eyes, QHS,  Alma Friendly, MD, 1 drop at 03/23/17 2121  Patients Current Diet: DIET DYS 3 Room service appropriate? Yes; Fluid consistency: Thin  Precautions / Restrictions Precautions Precautions: Fall Precaution Comments: L eye blindness, and decreased vision R eye Restrictions Weight Bearing Restrictions: No   Has the patient had 2 or more falls or a fall with injury in the past year?No  Prior Activity Level Limited Community (1-2x/wk): Went out 3-4 X a week with her daughter.  Home Assistive Devices / Equipment Home Assistive Devices/Equipment: Hearing aid, Eyeglasses Home Equipment: None  Prior Device Use: Indicate devices/aids used by the patient prior to current illness, exacerbation or injury? None  Prior Functional Level Prior Function Level of Independence: Independent Comments: does not drive but independent with all ADL  Self  Care: Did the patient need help bathing, dressing, using the toilet or eating?  Independent  Indoor Mobility: Did the patient need assistance with walking from room to room (with or without device)? Independent  Stairs: Did the patient need assistance with internal or external stairs (with or without device)? Independent  Functional Cognition: Did the patient need help planning regular tasks such as shopping or remembering to take medications? Independent  Current Functional Level Cognition  Arousal/Alertness: Awake/alert Overall Cognitive Status: Impaired/Different from baseline Orientation Level: Oriented X4 Following Commands: Follows one step commands with increased time Safety/Judgement: Decreased awareness of safety, Decreased awareness of deficits General Comments: reports sometimes forgetting she is in the hosptial and no idea how to find her room, continued decreased L side awareness Attention: Sustained Sustained Attention: Impaired Sustained Attention Impairment: Verbal basic, Functional basic Memory: Impaired Memory Impairment:  Retrieval deficit, Decreased short term memory Decreased Short Term Memory: Verbal basic, Functional basic Awareness: Appears intact Problem Solving: Impaired Problem Solving Impairment: Verbal basic, Functional basic Executive Function: Sequencing, Organizing Sequencing: Impaired Sequencing Impairment: Verbal basic, Functional basic Organizing: Impaired Organizing Impairment: Verbal basic, Functional basic Safety/Judgment: Appears intact    Extremity Assessment (includes Sensation/Coordination)  Upper Extremity Assessment: Defer to OT evaluation LUE Deficits / Details: Grossly 4/5, full AROM.   Lower Extremity Assessment: LLE deficits/detail, RLE deficits/detail RLE Deficits / Details: AROM WFL, strength grossly 4+/5 LLE Deficits / Details: AROM WFL, strength grossly 4+/5    ADLs  Overall ADL's : Needs assistance/impaired Eating/Feeding: Minimal assistance, Sitting, Cueing for sequencing, Cueing for safety, Cueing for compensatory techinques Eating/Feeding Details (indicate cue type and reason): Pt requiring cues thorughout meal prep task to attend to L side of table  Grooming: Sitting, Min guard, Cueing for sequencing, Minimal assistance, Oral care, Brushing hair, Wash/dry face Grooming Details (indicate cue type and reason): Pt requiring cues throughout grooming to locate items on L side of sick. With cues, pt is able to locate items and cross midline. Upper Body Bathing: Moderate assistance, Sitting Lower Body Bathing: Maximal assistance, Sit to/from stand Upper Body Dressing : Moderate assistance, Sitting Lower Body Dressing: Minimal assistance, Sit to/from stand Lower Body Dressing Details (indicate cue type and reason): Pt donning pants and shoes with Min A while sitting at EOB. Min A for balance and safety Toilet Transfer: Maximal assistance, Stand-pivot, BSC Toilet Transfer Details (indicate cue type and reason): Attempted with and without RW. Toileting- Clothing  Manipulation and Hygiene: Minimal assistance, Sitting/lateral lean Toileting - Clothing Manipulation Details (indicate cue type and reason): for peri care, min assist for balance Functional mobility during ADLs: Minimal assistance, Rolling walker General ADL Comments: Pt demonstrating increased functional performance. However, contineus to be a fall risk due to visual deficits and balance. Eduated pt and daughter on compensatory tehcniques for low vision and field cut deficits (i.e. anchors to L, contrast colors, and lighting).    Mobility  Overal bed mobility: Needs Assistance Bed Mobility: Supine to Sit, Sit to Supine Supine to sit: Min guard, HOB elevated Sit to supine: Mod assist General bed mobility comments: in recliner    Transfers  Overall transfer level: Needs assistance Equipment used: Rolling walker (2 wheeled) Transfers: Sit to/from Stand Sit to Stand: Min guard Stand pivot transfers: Max assist General transfer comment: cues for hand placement    Ambulation / Gait / Stairs / Wheelchair Mobility  Ambulation/Gait Ambulation/Gait assistance: Museum/gallery curator (Feet): 200 Feet Assistive device: Rolling walker (2 wheeled) Gait Pattern/deviations: Step-through pattern, Decreased stride length,  Shuffle, Drifts right/left General Gait Details: min A for balance with ambulation, navigating obstacles in room and for direction with preference for R side, cues for step length as initially shorter on L, demonstrates increased speed and some veering with walker Gait velocity: slowed Gait velocity interpretation: Below normal speed for age/gender    Posture / Balance Dynamic Sitting Balance Sitting balance - Comments: leaning to L in sitting at EOB, son supporing Balance Overall balance assessment: Needs assistance Sitting-balance support: Feet supported, Bilateral upper extremity supported Sitting balance-Leahy Scale: Fair Sitting balance - Comments: leaning to L in  sitting at EOB, son supporing Postural control: Left lateral lean, Posterior lean Standing balance support: Bilateral upper extremity supported Standing balance-Leahy Scale: Fair Standing balance comment: able to achieve static standing with no UE support    Special needs/care consideration BiPAP/CPAP No CPM No Continuous Drip IV No Dialysis No       Life Vest No Oxygen No Special Bed No Trach Size No Wound Vac (area) No     Skin Dry, thin skin that tears easily                           Bowel mgmt: Last BM 03/23/17 Bladder mgmt: Voiding up on BSC  Diabetic mgmt No    Previous Home Environment Living Arrangements: Alone  Lives With: Alone Available Help at Discharge: Family, Available PRN/intermittently Type of Home: House Home Layout: One level Home Access: Stairs to enter [employer] of Steps: 3 in front, 1 in back Bathroom Shower/Tub: Chiropodist: [place]ell: No  Discharge Living Setting Plans for Discharge Living Setting: Patient's home, Alone, House(Lives alone.) Type of Home at Discharge: House Discharge Home Layout: Multi-level(Split level home) Alternate Level Stairs-Number of Steps: 5-6 steps up to bedrooms/bathroom and 5-6 steps down to bedroom/full bath, laundry and family room. Discharge Home Access: Stairs to enter, Level entry Entrance Stairs-Number of Steps: 2 steps at the front and ramp at the back door. Does the patient have any problems obtaining your medications?: No  Social/Family/Support Systems Patient Roles: Parent(Has 2 daughters and 1 son.) Contact Information: [name] - daughter - [phone] Anticipated Caregiver: Family Ability/Limitations of Caregiver: Dtr [name] does not work.  Son works.  Another daughter lives in [place]. Caregiver Availability: Intermittent Discharge Plan Discussed with Primary Caregiver: Yes Is Caregiver In Agreement with Plan?: Yes Does Caregiver/Family have  Issues with Lodging/Transportation while Pt is in Rehab?: No  Goals/Additional Needs Patient/Family Goal for Rehab: PT/OT supervision to min assist, SLP supervision goals Expected length of stay: 20-25 days Cultural Considerations: [employer] Dietary Needs: Dys 3, thin liquids Equipment Needs: TBD Pt/Family Agrees to Admission and willing to participate: Yes Program Orientation Provided & Reviewed with Pt/Caregiver Including Roles  & Responsibilities: Yes  Decrease burden of Care through IP rehab admission: N/A  Possible need for SNF placement upon discharge: Not planned  Patient Condition: This patient's medical and functional status has changed since the consult dated: 03/19/17 in which the Rehabilitation Physician determined and documented that the patient's condition is appropriate for intensive rehabilitative care in an inpatient rehabilitation facility. See "History of Present Illness" (above) for medical update. Functional changes are: Currently requiring min assist to ambulate 200 feet RW. Patient's medical and functional status update has been discussed with the Rehabilitation physician and patient remains appropriate for inpatient rehabilitation. Will admit to inpatient rehab today.  Preadmission Screen Completed By:  Retta Diones, 03/24/2017 1:38  PM ______________________________________________________________________   Discussed status with Dr. Posey Pronto on 03/24/17 at 1341 and received telephone approval for admission today.  Admission Coordinator:  Retta Diones, time 1347/Date 03/24/17

## 2017-03-22 NOTE — Progress Notes (Signed)
Physical Therapy Treatment Patient Details Name: [name] [name] MRN: [id number] DOB: [birth year] Today's Date: 03/22/2017    History of Present Illness 83 y.o. 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 CT angio head/neck showed new short segment occlusion of the right PCA at its origin. New severe distal left P2 stenosis.MRI showed: Acute/early subacute infarction centered in right anteromedial midbrain with involvement of right cerebral peduncle.     PT Comments    Pt is making good progress towards her goals but continues to be limited by her decreased visual field and her decreased balance. Pt is minA for transfers and ambulates with minA with a RW, however occasionally experiences instability that requires modA for correction. PT continues to recommend CIR given pt's prior independent living and very supportive family that will be available to assist her with her eventual discharge home.    Follow Up Recommendations  CIR     Equipment Recommendations  Other (comment)(TBD)    Recommendations for Other Services Rehab consult     Precautions / Restrictions Precautions Precautions: Fall Restrictions Weight Bearing Restrictions: No    Mobility  Bed Mobility               General bed mobility comments: OOB in recliner on entry  Transfers Overall transfer level: Needs assistance Equipment used: Rolling walker (2 wheeled) Transfers: Sit to/from Stand Sit to Stand: Min assist         General transfer comment: minA for steadying in standing with RW  Ambulation/Gait Ambulation/Gait assistance: Mod assist;Min assist Ambulation Distance (Feet): 100 Feet Assistive device: Rolling walker (2 wheeled) Gait Pattern/deviations: Step-to  pattern;Decreased step length - right;Decreased step length - left;Leaning posteriorly;Shuffle Gait velocity: slowed Gait velocity interpretation: Below normal speed for age/gender General Gait Details: modA for maintaining balance with gait, pt experienced 3-4x LoB which required modA to correct, vc for sequencing, proximity to walker, decreased gait speed and increased BoS.      Modified Rankin (Stroke Patients Only) Modified Rankin (Stroke Patients Only) Pre-Morbid Rankin Score: No symptoms Modified Rankin: Moderately severe disability     Balance Overall balance assessment: Needs assistance Sitting-balance support: Feet supported;Bilateral upper extremity supported Sitting balance-Leahy Scale: Fair   Postural control: Left lateral lean;Posterior lean Standing balance support: Bilateral upper extremity supported Standing balance-Leahy Scale: Fair Standing balance comment: able to achieve static standing with no UE support                            Cognition Arousal/Alertness: Lethargic   Overall Cognitive Status: Impaired/Different from baseline Area of Impairment: Following commands;Safety/judgement;Problem solving                       Following Commands: Follows one step commands with increased time Safety/Judgement: Decreased awareness of safety;Decreased awareness of deficits   Problem Solving: Slow processing;Decreased initiation;Difficulty sequencing;Requires verbal cues;Requires tactile cues General Comments: Pt cognition is improved today, Pt continues to require cuing to maintain safety with mobility.      Exercises Other Exercises Other Exercises: Rhomberg standing 3x 30 sec Other Exercises: tandem stance R foot forward 3 x 30 sec Other Exercises: tandem stance with L foot forward, 3 x 20 sec  Pertinent Vitals/Pain Pain Assessment: No/denies pain    Home Living Family/patient expects to be discharged to:: Private  residence Living Arrangements: Alone Available Help at Discharge: Family;Available PRN/intermittently Type of Home: House Home Access: Stairs to enter   Home Layout: One level Home Equipment: None      Prior Function Level of Independence: Independent      Comments: does not drive but independent with all ADL   PT Goals (current goals can now be found in the care plan section) Acute Rehab PT Goals Patient Stated Goal: return to independence PT Goal Formulation: With patient/family Time For Goal Achievement: 04/02/17 Potential to Achieve Goals: Fair Progress towards PT goals: Progressing toward goals    Frequency    Min 4X/week      PT Plan Current plan remains appropriate       AM-PAC PT "6 Clicks" Daily Activity  Outcome Measure  Difficulty turning over in bed (including adjusting bedclothes, sheets and blankets)?: A Little Difficulty moving from lying on back to sitting on the side of the bed? : A Lot Difficulty sitting down on and standing up from a chair with arms (e.g., wheelchair, bedside commode, etc,.)?: Unable Help needed moving to and from a bed to chair (including a wheelchair)?: Total Help needed walking in hospital room?: Total Help needed climbing 3-5 steps with a railing? : Total 6 Click Score: 9    End of Session Equipment Utilized During Treatment: Gait belt Activity Tolerance: Patient tolerated treatment well Patient left: with call bell/phone within reach;with chair alarm set;with family/visitor present;in chair Nurse Communication: Mobility status PT Visit Diagnosis: Unsteadiness on feet (R26.81);Other abnormalities of gait and mobility (R26.89);Muscle weakness (generalized) (M62.81);Difficulty in walking, not elsewhere classified (R26.2);Other symptoms and signs involving the nervous system (R29.898)     Time: 6812-7517 PT Time Calculation (min) (ACUTE ONLY): 30 min  Charges:  $Gait Training: 8-22 mins $Therapeutic Exercise: 8-22  mins                    G Codes:       [name] [name] PT, DPT Acute Rehabilitation  [phone] Pager [phone]     [place] 03/22/2017, 11:21 AM

## 2017-03-22 NOTE — Progress Notes (Signed)
PROGRESS NOTE    [name] [name]  QBH:[id number] DOB: 28-Mar-1924 DOA: 03/18/2017 PCP: Darcus Austin, MD   No chief complaint on file.    Brief Narrative:  83 year old female history of prior CVA, possible atrial fibrillation versus SVT with implantable loop recorder, CAD, GERD, hypertension, diabetes who presented with right eye vision loss, left finger numbness and lip numbness. Patient admitted for CVA. Pending CIR  Assessment & Plan   Acute CVA -CT head showed no acute abnormality -MRI brain showed acute/early subacute infarction centered in the right anterior medial midbrain with involvement of the right cerebral peduncle. -CTA head: New short segment occlusion of the right PCA. No severe distal left P2 stenosis. No large vessel occlusion or shaking proximal stenosis in the anterior circulation. -LDL 118, hemoglobin A1c 5.3 -Echocardiogram 09-73%, LV diastolic function parameters were normal. No cardiac source of emboli. -carotid doppler 1-39% B/L ICA stenosis. Vertebral arteries patent with antegrade flow. -Continue statin, Plavix -Neurology consulted and appreciated- continue DAPT for 3 weeks, then plavix alone thereafter, continue statin. Follow up with Dr. Jannifer Franklin. -Discussed with Dr. Leonie Man, no documentation of AF since last stroke in 2015 -PT/OT recommended CIR -CIR consulted- potential candidate -Social work consulted for possible SNF  Essential hypertension -Allow for permissive hypertension  History of SVT versus atrial fibrillation -Patient has loop recorder implanted -discussed with neurology, loop interrogated, no PAF currently. Had 10 second seconds of AF in Nov 2016. Has had frequent intermittent SVTs.  -patient will need to follow up with Dr. Burt Knack, cardiologist  CAD -no complaints of chest pain -Continue Plavix, statin  GERD  -Continue H2 blocker   Fuch's disease -patient with left eye blindness  -continue eye drops -recommended patient follow up with  her ophthalmologist upon discharge as her prescription may have changed since her CVA  Hypokalemia -resolved, continue to monitor BMP periodically  Headache  -possibly related to the above -No longer complaining of headache -continue Fioricet PRN  DVT Prophylaxis  lovenox  Code Status:  Full  Family Communication: Family at bedside  Disposition Plan: Admitted. Pending CIR vs SNF  Consultants Neurology Inpatient rehab  Procedures  Echcoardiogram Carotid doppler  Antibiotics   Anti-infectives (From admission, onward)   None      Subjective:   Tyger Oka seen and examined today.  Wonders if her vision will improve. Stats she canot see well with her current glasses. Denies chest pain, shortness of breath, abdominal pain, N/V/D/C.   Objective:   Vitals:   03/21/17 2126 03/22/17 0152 03/22/17 0559 03/22/17 0936  BP: (!) 109/50 (!) 131/59 (!) 146/61 137/62  Pulse: 79 76 74 82  Resp: 18 18 19 20   Temp: 98.8 F (37.1 C) 98 F (36.7 C) 97.8 F (36.6 C)   TempSrc: Oral Oral Oral   SpO2: 95% 96% 100% 99%    Intake/Output Summary (Last 24 hours) at 03/22/2017 1035 Last data filed at 03/22/2017 0936 Gross per 24 hour  Intake 480 ml  Output -  Net 480 ml   There were no vitals filed for this visit.  Exam  General: Well developed, elderly, NAD, appears younger than stated age  84: NCAT, mucous membranes moist.   Cardiovascular: S1 S2 auscultated, RRR, no murmurs  Respiratory: Clear to auscultation bilaterally with equal chest rise  Abdomen: Soft, nontender, nondistended, + bowel sounds  Extremities: warm dry without cyanosis clubbing or edema  Neuro: AAOx3, mild left sided weakness, no new deficits  Psych: Pleasant, appropriate mood and affect  Data Reviewed: I  have personally reviewed following labs and imaging studies  CBC: Recent Labs  Lab 03/18/17 1545 03/18/17 1550 03/19/17 0456  WBC 5.0  --  5.5  NEUTROABS 2.5  --  3.6  HGB 15.2*  16.3* 14.3  HCT 47.1* 48.0* 43.2  MCV 98.5  --  95.8  PLT 180  --  222   Basic Metabolic Panel: Recent Labs  Lab 03/18/17 1545 03/18/17 1550 03/19/17 0456 03/20/17 0403 03/21/17 0409  NA 138 137 132* 129* 133*  K 3.7 4.0 3.3* 4.5 3.8  CL 101 99* 98* 93* 100*  CO2 24  --  25 26 25   GLUCOSE 165* 166* 118* 114* 95  BUN 16 23* 9 10 17   CREATININE 0.74 0.80 0.49 0.53 0.79  CALCIUM 9.2  --  8.7* 8.9 8.8*   GFR: CrCl cannot be calculated (Unknown ideal weight.). Liver Function Tests: Recent Labs  Lab 03/18/17 1545  AST 33  ALT 24  ALKPHOS 105  BILITOT 0.5  PROT 7.1  ALBUMIN 4.0   No results for input(s): LIPASE, AMYLASE in the last 168 hours. No results for input(s): AMMONIA in the last 168 hours. Coagulation Profile: Recent Labs  Lab 03/18/17 1545  INR 0.93   Cardiac Enzymes: No results for input(s): CKTOTAL, CKMB, CKMBINDEX, TROPONINI in the last 168 hours. BNP (last 3 results) No results for input(s): PROBNP in the last 8760 hours. HbA1C: No results for input(s): HGBA1C in the last 72 hours. CBG: No results for input(s): GLUCAP in the last 168 hours. Lipid Profile: No results for input(s): CHOL, HDL, LDLCALC, TRIG, CHOLHDL, LDLDIRECT in the last 72 hours. Thyroid Function Tests: No results for input(s): TSH, T4TOTAL, FREET4, T3FREE, THYROIDAB in the last 72 hours. Anemia Panel: No results for input(s): VITAMINB12, FOLATE, FERRITIN, TIBC, IRON, RETICCTPCT in the last 72 hours. Urine analysis:    Component Value Date/Time   COLORURINE YELLOW 12/19/2014 1830   APPEARANCEUR CLOUDY (A) 12/19/2014 1830   LABSPEC 1.011 12/19/2014 1830   PHURINE 6.0 12/19/2014 1830   GLUCOSEU NEGATIVE 12/19/2014 1830   HGBUR NEGATIVE 12/19/2014 1830   BILIRUBINUR NEGATIVE 12/19/2014 1830   KETONESUR NEGATIVE 12/19/2014 1830   PROTEINUR NEGATIVE 12/19/2014 1830   UROBILINOGEN 0.2 12/19/2014 1830   NITRITE NEGATIVE 12/19/2014 1830   LEUKOCYTESUR LARGE (A) 12/19/2014 1830    Sepsis Labs: @LABRCNTIP (procalcitonin:4,lacticidven:4)  )No results found for this or any previous visit (from the past 240 hour(s)).    Radiology Studies: No results found.   Scheduled Meds: . artificial tears  1 application Both Eyes QHS  . aspirin EC  81 mg Oral Daily  . atorvastatin  40 mg Oral q1800  . clopidogrel  75 mg Oral Daily  . enoxaparin (LOVENOX) injection  40 mg Subcutaneous Q24H  . famotidine  20 mg Oral QHS  . multivitamin  1 tablet Oral QPC breakfast  . prednisoLONE acetate  1 drop Both Eyes QHS   Continuous Infusions:   LOS: 4 days   Time Spent in minutes   30 minutes  Cyrena Kuchenbecker D.O. on 03/22/2017 at 10:35 AM  Between 7am to 7pm - Pager - [phone]  After 7pm go to [email or web address] - password [email or web address]  And look for the night coverage person covering for me after hours  [facility] Office  [phone]

## 2017-03-22 NOTE — Consult Note (Signed)
[facility] CM Primary Care Navigator  03/22/2017  [name] [name] 25-Aug-1924 [id number]   Met with patientand daughter [name] [name]) at the bedside to identify possible discharge needs.  Daughter reportsthat patient complained of sudden loss of vision to both eyes, stopped up ears, numbness to lips and left fingers. Patient also had an elevated BP of 224/91 which all led tothis admission.   Patient endorsesDr. Darcus Austin with [facility] as her primary care provider.    Patient states using [facility] pharmacy on [address] obtain medications without any problem.  Her daughter reports that patient has been managing her own medications at home using "pill box" system filled once a week and children are picking up her medications from the pharmacy.  Patient's transportation is being provided for by her daughter going to her doctors'appointments.  Daughter verbalized that patient's children will work it out in providing the care needs for patient once she gets back home.  [facility] by therapists prior to returning home.   Patient and daughter voiced understanding to call primary care provider's office when patient gets back home, for a post discharge follow-up within1-2weeksor sooner if needs arise. Patient letter (with PCP's contact number) was provided astheirreminder.   Discussed with patient and daughter regarding [facility] CM services available for health management at homebut both denied any needs or concerns at this point. Patient and daughterexpressed understandingof need to seek referral from primary care provider to [facility] care management if deemed necessary and appropriate for services in the future.   [facility] care management information was provided for future needs that she may have.  Noted order for EMMI Stroke calls in place for patient.   For  additional questions please contact:  Edwena Felty A. Kambree Krauss, BSN, RN-BC [facility] PRIMARY CARE Navigator Cell: [phone]

## 2017-03-23 ENCOUNTER — Other Ambulatory Visit: Payer: Self-pay

## 2017-03-23 ENCOUNTER — Encounter (HOSPITAL_COMMUNITY): Payer: Self-pay | Admitting: General Practice

## 2017-03-23 DIAGNOSIS — M1710 Unilateral primary osteoarthritis, unspecified knee: Secondary | ICD-10-CM

## 2017-03-23 LAB — BASIC METABOLIC PANEL
ANION GAP: 8 (ref 5–15)
BUN: 15 mg/dL (ref 6–20)
CALCIUM: 9 mg/dL (ref 8.9–10.3)
CO2: 29 mmol/L (ref 22–32)
Chloride: 100 mmol/L — ABNORMAL LOW (ref 101–111)
Creatinine, Ser: 0.66 mg/dL (ref 0.44–1.00)
GFR calc Af Amer: >60 mL/min (ref >60)
GLUCOSE: 91 mg/dL (ref 65–99)
Potassium: 3.7 mmol/L (ref 3.5–5.1)
Sodium: 137 mmol/L (ref 135–145)

## 2017-03-23 NOTE — Progress Notes (Signed)
PROGRESS NOTE    [name] [name]  JJO:[id number] DOB: 1924/04/18 DOA: 03/18/2017 PCP: Darcus Austin, MD   No chief complaint on file.    Brief Narrative:  84 year old female history of prior CVA, possible atrial fibrillation versus SVT with implantable loop recorder, CAD, GERD, hypertension, diabetes who presented with right eye vision loss, left finger numbness and lip numbness. Patient admitted for CVA. Pending CIR  Assessment & Plan   Acute CVA -CT head showed no acute abnormality -MRI brain showed acute/early subacute infarction centered in the right anterior medial midbrain with involvement of the right cerebral peduncle. -CTA head: New short segment occlusion of the right PCA. No severe distal left P2 stenosis. No large vessel occlusion or shaking proximal stenosis in the anterior circulation. -LDL 118, hemoglobin A1c 5.3 -Echocardiogram 16-01%, LV diastolic function parameters were normal. No cardiac source of emboli. -carotid doppler 1-39% B/L ICA stenosis. Vertebral arteries patent with antegrade flow. -Continue statin, Plavix -Neurology consulted and appreciated- continue DAPT for 3 weeks, then plavix alone thereafter, continue statin. Follow up with Dr. Jannifer Franklin. -Discussed with Dr. Leonie Man, no documentation of AF since last stroke in 2015 -PT/OT recommended CIR -CIR consulted- potential candidate, pending approval -Social work consulted for possible SNF  Essential hypertension -Allow for permissive hypertension  History of SVT versus atrial fibrillation -Patient has loop recorder implanted -discussed with neurology, loop interrogated, no PAF currently. Had 10 second seconds of AF in Nov 2016. Has had frequent intermittent SVTs.  -patient will need to follow up with Dr. Burt Knack, cardiologist  CAD -no complaints of chest pain -Continue Plavix, statin  GERD  -Continue H2 blocker   Fuch's disease -patient with left eye blindness  -continue eye drops -recommended patient  follow up with her ophthalmologist upon discharge as her prescription may have changed since her CVA  Hypokalemia -resolved, continue to monitor BMP periodically  Headache  -possibly related to the above -No longer complaining of headache -continue Fioricet PRN  DVT Prophylaxis  lovenox  Code Status:  Full  Family Communication: Family at bedside  Disposition Plan: Admitted. Pending CIR  Consultants Neurology Inpatient rehab  Procedures  Echcoardiogram Carotid doppler  Antibiotics   Anti-infectives (From admission, onward)   None      Subjective:   [name] [name] seen and examined today.  Would like her vision to get better. She currently denies chest pain, shortness of breath, abdominal pain, N/V/D/C.   Objective:   Vitals:   03/22/17 1303 03/22/17 1759 03/22/17 2206 03/23/17 1007  BP: 134/61 138/68 (!) 154/50 (!) 151/61  Pulse: 84 87 88 74  Resp: 20 20 18 19   Temp: 97.9 F (36.6 C)  98.7 F (37.1 C) 98 F (36.7 C)  TempSrc: Oral  Oral Oral  SpO2: 99% 96% 94% 98%    Intake/Output Summary (Last 24 hours) at 03/23/2017 1243 Last data filed at 03/23/2017 0732 Gross per 24 hour  Intake 720 ml  Output -  Net 720 ml   There were no vitals filed for this visit.  Exam (no change from exam on 03/22/2017)  General: Well developed, elderly, NAD, appears younger than stated age  64: NCAT, mucous membranes moist.   Cardiovascular: S1 S2 auscultated, RRR, no murmurs  Respiratory: Clear to auscultation bilaterally with equal chest rise  Abdomen: Soft, nontender, nondistended, + bowel sounds  Extremities: warm dry without cyanosis clubbing or edema  Neuro: AAOx3, mild left sided weakness, no new deficits  Psych: Pleasant, appropriate mood and affect  Data Reviewed: I have  personally reviewed following labs and imaging studies  CBC: Recent Labs  Lab 03/18/17 1545 03/18/17 1550 03/19/17 0456  WBC 5.0  --  5.5  NEUTROABS 2.5  --  3.6  HGB 15.2* 16.3*  14.3  HCT 47.1* 48.0* 43.2  MCV 98.5  --  95.8  PLT 180  --  376   Basic Metabolic Panel: Recent Labs  Lab 03/18/17 1545 03/18/17 1550 03/19/17 0456 03/20/17 0403 03/21/17 0409 03/23/17 0953  NA 138 137 132* 129* 133* 137  K 3.7 4.0 3.3* 4.5 3.8 3.7  CL 101 99* 98* 93* 100* 100*  CO2 24  --  25 26 25 29   GLUCOSE 165* 166* 118* 114* 95 91  BUN 16 23* 9 10 17 15   CREATININE 0.74 0.80 0.49 0.53 0.79 0.66  CALCIUM 9.2  --  8.7* 8.9 8.8* 9.0   GFR: CrCl cannot be calculated (Unknown ideal weight.). Liver Function Tests: Recent Labs  Lab 03/18/17 1545  AST 33  ALT 24  ALKPHOS 105  BILITOT 0.5  PROT 7.1  ALBUMIN 4.0   No results for input(s): LIPASE, AMYLASE in the last 168 hours. No results for input(s): AMMONIA in the last 168 hours. Coagulation Profile: Recent Labs  Lab 03/18/17 1545  INR 0.93   Cardiac Enzymes: No results for input(s): CKTOTAL, CKMB, CKMBINDEX, TROPONINI in the last 168 hours. BNP (last 3 results) No results for input(s): PROBNP in the last 8760 hours. HbA1C: No results for input(s): HGBA1C in the last 72 hours. CBG: No results for input(s): GLUCAP in the last 168 hours. Lipid Profile: No results for input(s): CHOL, HDL, LDLCALC, TRIG, CHOLHDL, LDLDIRECT in the last 72 hours. Thyroid Function Tests: No results for input(s): TSH, T4TOTAL, FREET4, T3FREE, THYROIDAB in the last 72 hours. Anemia Panel: No results for input(s): VITAMINB12, FOLATE, FERRITIN, TIBC, IRON, RETICCTPCT in the last 72 hours. Urine analysis:    Component Value Date/Time   COLORURINE YELLOW 12/19/2014 1830   APPEARANCEUR CLOUDY (A) 12/19/2014 1830   LABSPEC 1.011 12/19/2014 1830   PHURINE 6.0 12/19/2014 1830   GLUCOSEU NEGATIVE 12/19/2014 1830   HGBUR NEGATIVE 12/19/2014 1830   BILIRUBINUR NEGATIVE 12/19/2014 1830   KETONESUR NEGATIVE 12/19/2014 1830   PROTEINUR NEGATIVE 12/19/2014 1830   UROBILINOGEN 0.2 12/19/2014 1830   NITRITE NEGATIVE 12/19/2014 1830    LEUKOCYTESUR LARGE (A) 12/19/2014 1830   Sepsis Labs: @LABRCNTIP (procalcitonin:4,lacticidven:4)  )No results found for this or any previous visit (from the past 240 hour(s)).    Radiology Studies: No results found.   Scheduled Meds: . artificial tears  1 application Both Eyes QHS  . aspirin EC  81 mg Oral Daily  . atorvastatin  40 mg Oral q1800  . clopidogrel  75 mg Oral Daily  . enoxaparin (LOVENOX) injection  40 mg Subcutaneous Q24H  . famotidine  20 mg Oral QHS  . multivitamin  1 tablet Oral QPC breakfast  . prednisoLONE acetate  1 drop Both Eyes QHS   Continuous Infusions:   LOS: 5 days   Time Spent in minutes   30 minutes  Venson Ferencz D.O. on 03/23/2017 at 12:43 PM  Between 7am to 7pm - Pager - [phone]  After 7pm go to [email or web address] - password [email or web address]  And look for the night coverage person covering for me after hours  [facility] Office  [phone]

## 2017-03-24 ENCOUNTER — Encounter (HOSPITAL_COMMUNITY): Payer: Self-pay | Admitting: *Deleted

## 2017-03-24 ENCOUNTER — Inpatient Hospital Stay (HOSPITAL_COMMUNITY)
Admission: RE | Admit: 2017-03-24 | Discharge: 2017-04-03 | DRG: 057 | Disposition: A | Discharge status: Home Health | Payer: PPO | Source: Intra-hospital | Attending: Physical Medicine & Rehabilitation | Admitting: Physical Medicine & Rehabilitation

## 2017-03-24 ENCOUNTER — Other Ambulatory Visit: Payer: Self-pay | Admitting: Internal Medicine

## 2017-03-24 DIAGNOSIS — Z885 Allergy status to narcotic agent status: Secondary | ICD-10-CM

## 2017-03-24 DIAGNOSIS — Z961 Presence of intraocular lens: Secondary | ICD-10-CM | POA: Diagnosis present

## 2017-03-24 DIAGNOSIS — I252 Old myocardial infarction: Secondary | ICD-10-CM

## 2017-03-24 DIAGNOSIS — I6389 Other cerebral infarction: Secondary | ICD-10-CM | POA: Diagnosis present

## 2017-03-24 DIAGNOSIS — Z91048 Other nonmedicinal substance allergy status: Secondary | ICD-10-CM

## 2017-03-24 DIAGNOSIS — Z882 Allergy status to sulfonamides status: Secondary | ICD-10-CM | POA: Diagnosis not present

## 2017-03-24 DIAGNOSIS — K219 Gastro-esophageal reflux disease without esophagitis: Secondary | ICD-10-CM

## 2017-03-24 DIAGNOSIS — Z9109 Other allergy status, other than to drugs and biological substances: Secondary | ICD-10-CM

## 2017-03-24 DIAGNOSIS — Z79899 Other long term (current) drug therapy: Secondary | ICD-10-CM | POA: Diagnosis not present

## 2017-03-24 DIAGNOSIS — Z9842 Cataract extraction status, left eye: Secondary | ICD-10-CM | POA: Diagnosis not present

## 2017-03-24 DIAGNOSIS — H5440 Blindness, one eye, unspecified eye: Secondary | ICD-10-CM | POA: Diagnosis not present

## 2017-03-24 DIAGNOSIS — Z90710 Acquired absence of both cervix and uterus: Secondary | ICD-10-CM | POA: Diagnosis not present

## 2017-03-24 DIAGNOSIS — G8929 Other chronic pain: Secondary | ICD-10-CM | POA: Diagnosis present

## 2017-03-24 DIAGNOSIS — Z8679 Personal history of other diseases of the circulatory system: Secondary | ICD-10-CM | POA: Diagnosis not present

## 2017-03-24 DIAGNOSIS — G479 Sleep disorder, unspecified: Secondary | ICD-10-CM | POA: Diagnosis not present

## 2017-03-24 DIAGNOSIS — H53462 Homonymous bilateral field defects, left side: Secondary | ICD-10-CM | POA: Diagnosis not present

## 2017-03-24 DIAGNOSIS — H547 Unspecified visual loss: Secondary | ICD-10-CM

## 2017-03-24 DIAGNOSIS — I69319 Unspecified symptoms and signs involving cognitive functions following cerebral infarction: Secondary | ICD-10-CM | POA: Diagnosis not present

## 2017-03-24 DIAGNOSIS — Z96652 Presence of left artificial knee joint: Secondary | ICD-10-CM | POA: Diagnosis present

## 2017-03-24 DIAGNOSIS — Z91041 Radiographic dye allergy status: Secondary | ICD-10-CM | POA: Diagnosis not present

## 2017-03-24 DIAGNOSIS — I2510 Atherosclerotic heart disease of native coronary artery without angina pectoris: Secondary | ICD-10-CM | POA: Diagnosis not present

## 2017-03-24 DIAGNOSIS — R269 Unspecified abnormalities of gait and mobility: Secondary | ICD-10-CM | POA: Diagnosis not present

## 2017-03-24 DIAGNOSIS — I5033 Acute on chronic diastolic (congestive) heart failure: Secondary | ICD-10-CM | POA: Diagnosis not present

## 2017-03-24 DIAGNOSIS — I10 Essential (primary) hypertension: Secondary | ICD-10-CM | POA: Diagnosis not present

## 2017-03-24 DIAGNOSIS — H5340 Unspecified visual field defects: Secondary | ICD-10-CM

## 2017-03-24 DIAGNOSIS — E785 Hyperlipidemia, unspecified: Secondary | ICD-10-CM | POA: Diagnosis present

## 2017-03-24 DIAGNOSIS — M25551 Pain in right hip: Secondary | ICD-10-CM | POA: Diagnosis not present

## 2017-03-24 DIAGNOSIS — Z888 Allergy status to other drugs, medicaments and biological substances status: Secondary | ICD-10-CM | POA: Diagnosis not present

## 2017-03-24 DIAGNOSIS — H548 Legal blindness, as defined in USA: Secondary | ICD-10-CM | POA: Diagnosis not present

## 2017-03-24 DIAGNOSIS — Z7902 Long term (current) use of antithrombotics/antiplatelets: Secondary | ICD-10-CM | POA: Diagnosis not present

## 2017-03-24 DIAGNOSIS — Z881 Allergy status to other antibiotic agents status: Secondary | ICD-10-CM

## 2017-03-24 DIAGNOSIS — Z9841 Cataract extraction status, right eye: Secondary | ICD-10-CM | POA: Diagnosis not present

## 2017-03-24 DIAGNOSIS — E871 Hypo-osmolality and hyponatremia: Secondary | ICD-10-CM | POA: Diagnosis present

## 2017-03-24 DIAGNOSIS — H4901 Third [oculomotor] nerve palsy, right eye: Secondary | ICD-10-CM | POA: Diagnosis not present

## 2017-03-24 DIAGNOSIS — G4700 Insomnia, unspecified: Secondary | ICD-10-CM | POA: Diagnosis present

## 2017-03-24 DIAGNOSIS — I639 Cerebral infarction, unspecified: Secondary | ICD-10-CM | POA: Diagnosis not present

## 2017-03-24 MED ORDER — POLYVINYL ALCOHOL 1.4 % OP SOLN
1.0000 [drp] | Freq: Two times a day (BID) | OPHTHALMIC | Status: DC | PRN
  Administered 2017-03-25 – 2017-04-01 (×4): 1 [drp] via OPHTHALMIC
  Filled 2017-03-24: qty 15

## 2017-03-24 MED ORDER — ENOXAPARIN SODIUM 40 MG/0.4ML ~~LOC~~ SOLN
40.0000 mg | SUBCUTANEOUS | Status: DC
  Administered 2017-03-24 – 2017-04-02 (×10): 40 mg via SUBCUTANEOUS
  Filled 2017-03-24 (×10): qty 0.4

## 2017-03-24 MED ORDER — ALUM & MAG HYDROXIDE-SIMETH 200-200-20 MG/5ML PO SUSP
30.0000 mL | Freq: Four times a day (QID) | ORAL | Status: DC | PRN
  Administered 2017-03-24: 30 mL via ORAL
  Filled 2017-03-24: qty 30

## 2017-03-24 MED ORDER — TRAZODONE HCL 50 MG PO TABS
25.0000 mg | ORAL_TABLET | Freq: Every evening | ORAL | Status: DC | PRN
  Filled 2017-03-24: qty 1

## 2017-03-24 MED ORDER — FAMOTIDINE 20 MG PO TABS
20.0000 mg | ORAL_TABLET | Freq: Every day | ORAL | Status: DC
  Administered 2017-03-24 – 2017-04-02 (×10): 20 mg via ORAL
  Filled 2017-03-24 (×10): qty 1

## 2017-03-24 MED ORDER — PREDNISOLONE ACETATE 1 % OP SUSP
1.0000 [drp] | Freq: Every day | OPHTHALMIC | Status: DC
  Administered 2017-03-24 – 2017-04-02 (×10): 1 [drp] via OPHTHALMIC
  Filled 2017-03-24: qty 1

## 2017-03-24 MED ORDER — BISACODYL 10 MG RE SUPP: 10.0000 mg | Freq: Every day | RECTAL | Status: DC | PRN

## 2017-03-24 MED ORDER — DIPHENHYDRAMINE HCL 12.5 MG/5ML PO ELIX: 12.5000 mg | ORAL_SOLUTION | Freq: Four times a day (QID) | ORAL | Status: DC | PRN

## 2017-03-24 MED ORDER — ASPIRIN 81 MG PO TBEC: 81.0000 mg | DELAYED_RELEASE_TABLET | Freq: Every day | ORAL | 0 refills | Status: DC

## 2017-03-24 MED ORDER — GUAIFENESIN-DM 100-10 MG/5ML PO SYRP: 5.0000 mL | ORAL_SOLUTION | Freq: Four times a day (QID) | ORAL | Status: DC | PRN

## 2017-03-24 MED ORDER — ACETAMINOPHEN 325 MG PO TABS
325.0000 mg | ORAL_TABLET | ORAL | Status: DC | PRN
  Administered 2017-03-24 – 2017-03-29 (×5): 650 mg via ORAL
  Filled 2017-03-24 (×5): qty 2

## 2017-03-24 MED ORDER — ONDANSETRON HCL 4 MG PO TABS
4.0000 mg | ORAL_TABLET | Freq: Four times a day (QID) | ORAL | Status: DC | PRN
  Administered 2017-04-03: 4 mg via ORAL
  Filled 2017-03-24: qty 1

## 2017-03-24 MED ORDER — ALUMINUM HYDROXIDE GEL 320 MG/5ML PO SUSP
30.0000 mL | Freq: Four times a day (QID) | ORAL | Status: DC | PRN
  Filled 2017-03-24: qty 30

## 2017-03-24 MED ORDER — ONDANSETRON HCL 4 MG/2ML IJ SOLN: 4.0000 mg | Freq: Four times a day (QID) | INTRAMUSCULAR | Status: DC | PRN

## 2017-03-24 MED ORDER — ATORVASTATIN CALCIUM 40 MG PO TABS
40.0000 mg | ORAL_TABLET | Freq: Every day | ORAL | Status: DC
  Administered 2017-03-24 – 2017-03-26 (×3): 40 mg via ORAL
  Filled 2017-03-24 (×3): qty 1

## 2017-03-24 MED ORDER — PROSIGHT PO TABS
1.0000 | ORAL_TABLET | Freq: Every day | ORAL | Status: DC
  Administered 2017-03-25 – 2017-04-03 (×10): 1 via ORAL
  Filled 2017-03-24 (×10): qty 1

## 2017-03-24 MED ORDER — ARTIFICIAL TEARS OPHTHALMIC OINT
1.0000 "application " | TOPICAL_OINTMENT | Freq: Every day | OPHTHALMIC | Status: DC
  Administered 2017-03-24 – 2017-04-02 (×9): 1 via OPHTHALMIC
  Filled 2017-03-24: qty 3.5

## 2017-03-24 MED ORDER — POLYETHYLENE GLYCOL 3350 17 G PO PACK: 17.0000 g | PACK | Freq: Every day | ORAL | Status: DC | PRN

## 2017-03-24 MED ORDER — BUTALBITAL-APAP-CAFFEINE 50-325-40 MG PO TABS
1.0000 | ORAL_TABLET | ORAL | Status: DC | PRN
  Administered 2017-03-27: 1 via ORAL
  Filled 2017-03-24: qty 1

## 2017-03-24 MED ORDER — CLOPIDOGREL BISULFATE 75 MG PO TABS
75.0000 mg | ORAL_TABLET | Freq: Every day | ORAL | Status: DC
  Administered 2017-03-25 – 2017-04-03 (×10): 75 mg via ORAL
  Filled 2017-03-24 (×10): qty 1

## 2017-03-24 MED ORDER — FLEET ENEMA 7-19 GM/118ML RE ENEM: 1.0000 | ENEMA | Freq: Once | RECTAL | Status: DC | PRN

## 2017-03-24 MED ORDER — ATORVASTATIN CALCIUM 40 MG PO TABS: 40.0000 mg | ORAL_TABLET | Freq: Every day | ORAL | 0 refills | Status: DC

## 2017-03-24 MED ORDER — ASPIRIN EC 81 MG PO TBEC
81.0000 mg | DELAYED_RELEASE_TABLET | Freq: Every day | ORAL | Status: DC
  Administered 2017-03-25 – 2017-04-03 (×10): 81 mg via ORAL
  Filled 2017-03-24 (×10): qty 1

## 2017-03-24 NOTE — Progress Notes (Signed)
Rehab admissions - I have received a denial on acute inpatient rehab admission for today.  I have asked my rehab MD to do a peer to peer with [employer] insurance carrier.  I will follow up later today if we can overturn the denial.  Call me for questions.  #[phone]

## 2017-03-24 NOTE — Progress Notes (Signed)
Pt  D/C to rehab. No new concerns.  Report called to receiving RN

## 2017-03-24 NOTE — Progress Notes (Signed)
Patient received at 1635 via wheelchair. Patient alert and oriented with family present. Oriented patient and family to room and call bell system. Patient and family verbalized understanding of admission process. Continue with plan of care.  Mliss Sax

## 2017-03-24 NOTE — Progress Notes (Signed)
Pt complaining of indigestion, gas. Attempted to place order for Maalox as relayed in [facility] standing order set. Received allergy warning concerning yellow dye (Fluorescein). Called provider K. Schorr to check that Maalox was acceptable for this pt.  Order for Maalox was placed by provider.  Called pharmacy to further verify appropriateness and was reassured by [facility] that this med should not cause same reaction as eye dye listed in allergy list.  Will administer and continue to monitor.

## 2017-03-24 NOTE — Progress Notes (Signed)
OT Cancellation Note  Patient Details Name: [name] [name] MRN: [id number] DOB: 03-21-1925   Cancelled Treatment:    Reason Eval/Treat Not Completed: Other (comment)(Set to d/c to CIR today, will defer further therapy).  [place] 03/24/2017, 1:59 PM  Hulda Humphrey OTR/L [phone]

## 2017-03-24 NOTE — Progress Notes (Signed)
Physical Therapy Treatment Patient Details Name: [name] [name] MRN: [id number] DOB: [birth year] Today's Date: 03/24/2017    History of Present Illness 84 y.o. 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 CT angio head/neck showed new short segment occlusion of the right PCA at its origin. New severe distal left P2 stenosis.MRI showed: Acute/early subacute infarction centered in right anteromedial midbrain with involvement of right cerebral peduncle.     PT Comments    Patient progressing with ambulation distance, balance and LE strength.  Remains limited by decreased L side awareness, poor safety awareness, visual deficits with mod to max cues to navigate through tight spaces in room with walker safely.  Continue to feel she is appropriate for CIR level rehab at d/c to address issues and maximize safety for return to mod I at d/c.    Follow Up Recommendations  CIR     Equipment Recommendations  Other (comment)(TBA)    Recommendations for Other Services       Precautions / Restrictions Precautions Precautions: Fall Precaution Comments: L eye blindness, and decreased vision R eye Restrictions Weight Bearing Restrictions: No    Mobility  Bed Mobility               General bed mobility comments: in recliner  Transfers Overall transfer level: Needs assistance Equipment used: Rolling walker (2 wheeled) Transfers: Sit to/from Stand Sit to Stand: Min guard         General transfer comment: cues for hand placement  Ambulation/Gait Ambulation/Gait assistance: Min assist Ambulation Distance (Feet): 200 Feet Assistive device: Rolling walker (2 wheeled) Gait Pattern/deviations: Step-through pattern;Decreased stride length;Shuffle;Drifts right/left      General Gait Details: min A for balance with ambulation, navigating obstacles in room and for direction with preference for R side, cues for step length as initially shorter on L, demonstrates increased speed and some veering with walker   Stairs            Wheelchair Mobility    Modified Rankin (Stroke Patients Only) Modified Rankin (Stroke Patients Only) Pre-Morbid Rankin Score: No symptoms Modified Rankin: Moderately severe disability     Balance Overall balance assessment: Needs assistance   Sitting balance-Leahy Scale: Fair       Standing balance-Leahy Scale: Fair                              Cognition Arousal/Alertness: Awake/alert Behavior During Therapy: WFL for tasks assessed/performed Overall Cognitive Status: Impaired/Different from baseline Area of Impairment: Memory;Safety/judgement                     Memory: Decreased short-term memory   Safety/Judgement: Decreased awareness of safety;Decreased awareness of deficits     General Comments: reports sometimes forgetting she is in the hosptial and no idea how to find her room, continued decreased L side awareness      Exercises General Exercises - Lower Extremity Hip ABduction/ADduction: Strengthening;Both;10 reps;Standing Heel Raises: Strengthening;Both;10 reps;Standing Other Exercises Other Exercises: sit to stand x 5  Other Exercises: standing hamstring curls x 10 Other Exercises: forward step ups L foot first x 5 with rail    General Comments General comments (skin integrity, edema, etc.): daughter present throughout session; pt  asking about options if not able to go to rehab and educated on community based rehab facilities      Pertinent Vitals/Pain Pain Assessment: No/denies pain    Home Living                      Prior Function            PT Goals (current goals can now be found in the care plan section) Progress towards PT goals: Progressing toward  goals    Frequency    Min 4X/week      PT Plan Current plan remains appropriate    Co-evaluation              AM-PAC PT "6 Clicks" Daily Activity  Outcome Measure  Difficulty turning over in bed (including adjusting bedclothes, sheets and blankets)?: A Little Difficulty moving from lying on back to sitting on the side of the bed? : A Lot Difficulty sitting down on and standing up from a chair with arms (e.g., wheelchair, bedside commode, etc,.)?: Unable Help needed moving to and from a bed to chair (including a wheelchair)?: A Little Help needed walking in hospital room?: A Little Help needed climbing 3-5 steps with a railing? : A Lot 6 Click Score: 14    End of Session Equipment Utilized During Treatment: Gait belt Activity Tolerance: Patient tolerated treatment well Patient left: in chair;with call bell/phone within reach;with family/visitor present   PT Visit Diagnosis: Unsteadiness on feet (R26.81);Other abnormalities of gait and mobility (R26.89);Muscle weakness (generalized) (M62.81);Difficulty in walking, not elsewhere classified (R26.2);Other symptoms and signs involving the nervous system (U88.916)     Time: 9450-3888 PT Time Calculation (min) (ACUTE ONLY): 22 min  Charges:  $Gait Training: 8-22 mins                    G CodesMagda Kiel, [place] [phone] 03/24/2017    Reginia Naas 03/24/2017, 1:12 PM

## 2017-03-24 NOTE — NC FL2 (Signed)
[place] MEDICAID FL2 LEVEL OF CARE SCREENING TOOL     IDENTIFICATION  Patient Name: [name] [name] Birthdate: Mar 25, 1924 Sex: female Admission Date (Current Location): 03/18/2017  [facility] and [place] Number:  Herbalist and Address:  [facility], [place] [address] [place], [place] [address]      Provider Number: [id number]  Attending Physician Name and Address:  Cristal Ford, DO  Relative Name and Phone Number:       Current Level of Care: Hospital Recommended Level of Care: [place] Prior Approval Number:    Date Approved/Denied:   PASRR Number: [id number]  Discharge Plan: SNF    Current Diagnoses: Patient Active Problem List   Diagnosis Date Noted  . Stroke-like symptoms   . Benign essential HTN   . History of supraventricular tachycardia   . Hypokalemia   . Hyponatremia   . Stroke ([place]) 03/18/2017  . Other forms of angina pectoris ([place]) 08/07/2015  . Pre-diabetes   . Blind left eye- (Fuch's disease) 03/05/2015  . Diverticulitis 12/19/2014  . UTI (lower urinary tract infection) 12/19/2014  . Essential hypertension 12/19/2014  . CAD (coronary artery disease) 12/19/2014  . Diverticulitis of large intestine without perforation or abscess without bleeding   . History of Rt brain stroke Oct 2015 06/14/2014  . Headache 06/14/2014  . Malignant hypertension 01/20/2014  . Hypertensive urgency, malignant 01/20/2014  . NSTEMI Nov 2015- conservative Rx 01/20/2014  . Chest pain with moderate risk of acute coronary syndrome 01/19/2014  . Aphasia 01/04/2014  . Paresthesia 01/04/2014  . Acute CVA (cerebrovascular accident) ([place]) 01/04/2014  . LLQ abdominal pain 01/01/2014  . Generalized abdominal pain 01/01/2014  . History of diverticulitis 01/01/2014  . Diverticulitis of colon without hemorrhage 12/07/2013  . Diaphoresis 12/29/2012  . Anemia 12/28/2012  . Sepsis ([place]) 12/24/2012  . Cellulitis of leg, right  12/24/2012  . Pyuria 12/24/2012  . Hypoxemia 12/24/2012  . Acute on chronic diastolic heart failure ([place]) 12/24/2012  . Postoperative anemia due to acute blood loss 12/15/2012  . Sleep disturbance 10/03/2012  . Abdominal pulsatile mass 08/24/2012  . Depression 07/12/2012  . OA (osteoarthritis) of knee 06/27/2012  . History of PSVT 12/15/2011    Orientation RESPIRATION BLADDER Height & Weight     Self, Time, Situation, Place  Normal Continent Weight:   Height:     BEHAVIORAL SYMPTOMS/MOOD NEUROLOGICAL BOWEL NUTRITION STATUS      Continent Diet(mechanical soft)  AMBULATORY STATUS COMMUNICATION OF NEEDS Skin   Limited Assist Verbally Normal                       Personal Care Assistance Level of Assistance  Bathing, Feeding, Dressing Bathing Assistance: Limited assistance Feeding assistance: Independent Dressing Assistance: Limited assistance     Functional Limitations Info  Sight, Hearing, Speech Sight Info: Impaired Hearing Info: Impaired Speech Info: Adequate    SPECIAL CARE FACTORS FREQUENCY  PT (By licensed PT), OT (By licensed OT), Speech therapy     PT Frequency: 5x/wk OT Frequency: 5x/wk     Speech Therapy Frequency: 5x/wk      Contractures Contractures Info: Not present    Additional Factors Info  Code Status, Allergies Code Status Info: full Allergies Info: Contrast Media Iodinated Diagnostic Agents, Fluorescein, Other, Sulfa Antibiotics, Ultram Tramadol, Buprenex Buprenorphine Hcl, Buprenorphine Hcl, Keflex Cephalexin, Levofloxacin, Oxycodone-acetaminophen, Yellow Dye, Augmentin Amoxicillin-pot Clavulanate, Hydralazine Hcl, Levofloxacin, Percocet Oxycodone-acetaminophen, Tape, Zetia Ezetimibe  Current Medications (03/24/2017):  This is the current hospital active medication list Current Facility-Administered Medications  Medication Dose Route Frequency Provider Last Rate Last Dose  . acetaminophen (TYLENOL) tablet 650 mg  650 mg Oral  Q4H PRN Alma Friendly, MD   650 mg at 03/23/17 0020   Or  . acetaminophen (TYLENOL) solution 650 mg  650 mg Per Tube Q4H PRN Alma Friendly, MD       Or  . acetaminophen (TYLENOL) suppository 650 mg  650 mg Rectal Q4H PRN Alma Friendly, MD      . alum & mag hydroxide-simeth (MAALOX/MYLANTA) 200-200-20 MG/5ML suspension 30 mL  30 mL Oral Q6H PRN Schorr, Rhetta Mura, NP   30 mL at 03/24/17 0058  . artificial tears (LACRILUBE) ophthalmic ointment 1 application  1 application Both Eyes QHS Alma Friendly, MD   1 application at 78/67/54 2136  . aspirin EC tablet 81 mg  81 mg Oral Daily Rosalin Hawking, MD   81 mg at 03/24/17 1035  . atorvastatin (LIPITOR) tablet 40 mg  40 mg Oral q1800 Alma Friendly, MD   40 mg at 03/23/17 1717  . butalbital-acetaminophen-caffeine (FIORICET, ESGIC) 50-325-40 MG per tablet 1 tablet  1 tablet Oral Q4H PRN Cristal Ford, DO   1 tablet at 03/20/17 2136  . clopidogrel (PLAVIX) tablet 75 mg  75 mg Oral Daily Alma Friendly, MD   75 mg at 03/24/17 1035  . enoxaparin (LOVENOX) injection 40 mg  40 mg Subcutaneous Q24H Alma Friendly, MD   40 mg at 03/23/17 2120  . famotidine (PEPCID) tablet 20 mg  20 mg Oral QHS Mikhail, Velta Addison, DO   20 mg at 03/23/17 2121  . labetalol (NORMODYNE,TRANDATE) injection 10 mg  10 mg Intravenous Q8H PRN Alma Friendly, MD      . morphine 2 MG/ML injection 1 mg  1 mg Intravenous Q4H PRN Cristal Ford, DO   1 mg at 03/19/17 1701  . multivitamin (PROSIGHT) tablet 1 tablet  1 tablet Oral QPC breakfast Alma Friendly, MD   1 tablet at 03/24/17 0800  . polyvinyl alcohol (LIQUIFILM TEARS) 1.4 % ophthalmic solution 1 drop  1 drop Both Eyes BID PRN Alma Friendly, MD   1 drop at 03/21/17 2257  . prednisoLONE acetate (PRED FORTE) 1 % ophthalmic suspension 1 drop  1 drop Both Eyes QHS Alma Friendly, MD   1 drop at 03/23/17 2121     Discharge Medications: Please see discharge summary  for a list of discharge medications.  Relevant Imaging Results:  Relevant Lab Results:   Additional Information SS#: [id number]  Geralynn Ochs, LCSW

## 2017-03-24 NOTE — Progress Notes (Signed)
Rehab admissions - Our rehab MD has done a peer to peer review with the medical directory at [facility] and we now have approval for acute inpatient rehab admission for today.  Bed available and will admit to inpatient rehab today.  Call me for questions.  #[phone]

## 2017-03-24 NOTE — Progress Notes (Signed)
Retta Diones, RN  Rehab Admission Coordinator  Physical Medicine and Rehabilitation  PMR Pre-admission  Signed  Date of Service:  03/22/2017 1:49 PM       Related encounter: ED to Hosp-Admission (Current) from 03/18/2017 in [facility]      Signed            [] Hide copied text  [] Hover for details   PMR Admission Coordinator Pre-Admission Assessment  Patient: [name] [name] is an 84 y.o., female MRN: [id number] DOB: Apr 26, 1924 Height: 5'2" Weight:  171 lbs                                                                                                                                              Insurance Information HMO:      PPO:       PCP:       IPA:       80/20:       OTHER:   PRIMARY: [employer]      Policy#: [id number]      Subscriber: patient CM Name: Christinia Gully      Phone#: [phone]     Fax#: [phone] Pre-Cert#: [id number] for 10 days approved      Employer: Retired Benefits:  Phone #: [phone]     Name:  Lavena Stanford. Date: 03/23/17     Deduct: $0      Out of Pocket Max: $3400     Life Max: N/A CIR: $295 days 1-6       SNF:  $20 days 1-20; $160 days 21-160  Outpatient:  Medical necessity     Co-Pay: $15/session Home Health: 100%      Co-Pay: none DME: 80%     Co-Pay: 20% Providers: in network  Emergency Contact Information        Contact Information    Name Relation Home Work Mobile   [place] Daughter   [phone]   Mekia, Dipinto   [phone]   Lianne Moris Daughter   [phone]   [name],Mike Relative   [phone]     Current Medical History  Patient Admitting Diagnosis: Right anteromedial midbrain infarct   History of Present Illness: A 84 y.o.femalewith history of HTN, SVT, CAD,Fuch's disease withleft eye blindnessand decreased vision right eye, blance deficits;who was admitted on 03/18/17 with sudden loss of vision in right eye, left facial numbness and elevated  BP.History taken from chart review and family.CTA head/neck showed new short segment occlusion of R-PCA at origin with new severe distal left P2 stenosis. Vision improved to baseline and tPA not given. MRI brainreviewed, showing right brainstem infarct. Per report, acute/early subacute infarct in right anteromedial midbrain with involvement of right cerebral peduncle and moderate chronic small vessel disease with moderate parenchymal brain volume loss.Dr. Leonie Man recommended DAPT for stroke due to SVD v/s HTN emergency.Loop recorder interrogated-- no episodic a fib but  has had frequent intermittent SVTs.Patient with resultant balance deficits, left inattention, cognitive deficits with deficits in problem solving functional tasks as well as poor safety awareness. CIR recommended due to functional deficits.  Total: 5=NIH  Past Medical History      Past Medical History:  Diagnosis Date  . Arthritis    "thumbs, joints" (03/23/2017  . Basal cell carcinoma    "several; scattered over my face, hands, leg some cut off; some burned off"  . Blind left eye    a. Corneal transplant x3 with rejection  . CAD (coronary artery disease)    a. s/p NSTEMI 2015 treated conservatively; nuclear stress test low risk. b. Continued angina despite med rx 07/2015 - s/p io [facility] Stent to ramus intermedius with mod LAD stenosis neg by FFR.  . Complication of anesthesia    "very sensitive to RX"  . Diverticulitis large intestine   . Diverticulosis   . Family history of adverse reaction to anesthesia    "we all get PONV" (1/1/201)  . Gastritis   . GERD (gastroesophageal reflux disease)   . Hayfever   . Helicobacter pylori (H. pylori) 05/22/02   RUT-Positive  . Hypertension   . Myocardial infarction ([place]) 2015  . Squamous carcinoma    "several; scattered over my face, hands, leg some cut off; some burned off"  . Stroke [facility]) 2015   denies residual on 08/07/2015  . Stroke [facility]  03/18/2017   "has effected her vision, balance, some memory" (03/23/2017)  . SVT (supraventricular tachycardia) (HCC)    a. seen by Dr. Caryl Comes - has loop recorder in. Patient has declined amiodarone due to side effect profile  . Varicose veins     Family History  family history includes Cancer in her brother; Heart attack in her brother, brother, brother, brother, and father; Parkinsonism in her brother; Stroke in her mother.  Prior Rehab/Hospitalizations: No recent rehab.  Has the patient had major surgery during 100 days prior to admission? No  Current Medications   Current Facility-Administered Medications:  .  acetaminophen (TYLENOL) tablet 650 mg, 650 mg, Oral, Q4H PRN, 650 mg at 03/23/17 0020 **OR** acetaminophen (TYLENOL) solution 650 mg, 650 mg, Per Tube, Q4H PRN **OR** acetaminophen (TYLENOL) suppository 650 mg, 650 mg, Rectal, Q4H PRN, Alma Friendly, MD .  alum & mag hydroxide-simeth (MAALOX/MYLANTA) 200-200-20 MG/5ML suspension 30 mL, 30 mL, Oral, Q6H PRN, Schorr, Rhetta Mura, NP, 30 mL at 03/24/17 0058 .  artificial tears (LACRILUBE) ophthalmic ointment 1 application, 1 application, Both Eyes, QHS, Alma Friendly, MD, 1 application at 00/92/33 2136 .  aspirin EC tablet 81 mg, 81 mg, Oral, Daily, Rosalin Hawking, MD, 81 mg at 03/24/17 1035 .  atorvastatin (LIPITOR) tablet 40 mg, 40 mg, Oral, q1800, Alma Friendly, MD, 40 mg at 03/23/17 1717 .  butalbital-acetaminophen-caffeine (FIORICET, ESGIC) 50-325-40 MG per tablet 1 tablet, 1 tablet, Oral, Q4H PRN, Cristal Ford, DO, 1 tablet at 03/20/17 2136 .  clopidogrel (PLAVIX) tablet 75 mg, 75 mg, Oral, Daily, Alma Friendly, MD, 75 mg at 03/24/17 1035 .  enoxaparin (LOVENOX) injection 40 mg, 40 mg, Subcutaneous, Q24H, Alma Friendly, MD, 40 mg at 03/23/17 2120 .  famotidine (PEPCID) tablet 20 mg, 20 mg, Oral, QHS, Mikhail, [place], DO, 20 mg at 03/23/17 2121 .  labetalol (NORMODYNE,TRANDATE)  injection 10 mg, 10 mg, Intravenous, Q8H PRN, Alma Friendly, MD .  morphine 2 MG/ML injection 1 mg, 1 mg, Intravenous, Q4H PRN, Mikhail, Maryann, DO, 1 mg  at 03/19/17 1701 .  multivitamin (PROSIGHT) tablet 1 tablet, 1 tablet, Oral, QPC breakfast, Alma Friendly, MD, 1 tablet at 03/24/17 0800 .  polyvinyl alcohol (LIQUIFILM TEARS) 1.4 % ophthalmic solution 1 drop, 1 drop, Both Eyes, BID PRN, Alma Friendly, MD, 1 drop at 03/21/17 2257 .  prednisoLONE acetate (PRED FORTE) 1 % ophthalmic suspension 1 drop, 1 drop, Both Eyes, QHS, Alma Friendly, MD, 1 drop at 03/23/17 2121  Patients Current Diet: DIET DYS 3 Room service appropriate? Yes; Fluid consistency: Thin  Precautions / Restrictions Precautions Precautions: Fall Precaution Comments: L eye blindness, and decreased vision R eye Restrictions Weight Bearing Restrictions: No   Has the patient had 2 or more falls or a fall with injury in the past year?No  Prior Activity Level Limited Community (1-2x/wk): Went out 3-4 X a week with her daughter.  Home Assistive Devices / Equipment Home Assistive Devices/Equipment: Hearing aid, Eyeglasses Home Equipment: None  Prior Device Use: Indicate devices/aids used by the patient prior to current illness, exacerbation or injury? None  Prior Functional Level Prior Function Level of Independence: Independent Comments: does not drive but independent with all ADL  Self Care: Did the patient need help bathing, dressing, using the toilet or eating?  Independent  Indoor Mobility: Did the patient need assistance with walking from room to room (with or without device)? Independent  Stairs: Did the patient need assistance with internal or external stairs (with or without device)? Independent  Functional Cognition: Did the patient need help planning regular tasks such as shopping or remembering to take medications? Independent  Current Functional Level Cognition   Arousal/Alertness: Awake/alert Overall Cognitive Status: Impaired/Different from baseline Orientation Level: Oriented X4 Following Commands: Follows one step commands with increased time Safety/Judgement: Decreased awareness of safety, Decreased awareness of deficits General Comments: reports sometimes forgetting she is in the hosptial and no idea how to find her room, continued decreased L side awareness Attention: Sustained Sustained Attention: Impaired Sustained Attention Impairment: Verbal basic, Functional basic Memory: Impaired Memory Impairment: Retrieval deficit, Decreased short term memory Decreased Short Term Memory: Verbal basic, Functional basic Awareness: Appears intact Problem Solving: Impaired Problem Solving Impairment: Verbal basic, Functional basic Executive Function: Sequencing, Organizing Sequencing: Impaired Sequencing Impairment: Verbal basic, Functional basic Organizing: Impaired Organizing Impairment: Verbal basic, Functional basic Safety/Judgment: Appears intact    Extremity Assessment (includes Sensation/Coordination)  Upper Extremity Assessment: Defer to OT evaluation LUE Deficits / Details: Grossly 4/5, full AROM.   Lower Extremity Assessment: LLE deficits/detail, RLE deficits/detail RLE Deficits / Details: AROM WFL, strength grossly 4+/5 LLE Deficits / Details: AROM WFL, strength grossly 4+/5    ADLs  Overall ADL's : Needs assistance/impaired Eating/Feeding: Minimal assistance, Sitting, Cueing for sequencing, Cueing for safety, Cueing for compensatory techinques Eating/Feeding Details (indicate cue type and reason): Pt requiring cues thorughout meal prep task to attend to L side of table  Grooming: Sitting, Min guard, Cueing for sequencing, Minimal assistance, Oral care, Brushing hair, Wash/dry face Grooming Details (indicate cue type and reason): Pt requiring cues throughout grooming to locate items on L side of sick. With cues, pt is able to locate  items and cross midline. Upper Body Bathing: Moderate assistance, Sitting Lower Body Bathing: Maximal assistance, Sit to/from stand Upper Body Dressing : Moderate assistance, Sitting Lower Body Dressing: Minimal assistance, Sit to/from stand Lower Body Dressing Details (indicate cue type and reason): Pt donning pants and shoes with Min A while sitting at EOB. Min A for balance and safety Toilet Transfer:  Maximal assistance, Stand-pivot, [facility] Toilet Transfer Details (indicate cue type and reason): Attempted with and without RW. Toileting- Clothing Manipulation and Hygiene: Minimal assistance, Sitting/lateral lean Toileting - Clothing Manipulation Details (indicate cue type and reason): for peri care, min assist for balance Functional mobility during ADLs: Minimal assistance, Rolling walker General ADL Comments: Pt demonstrating increased functional performance. However, contineus to be a fall risk due to visual deficits and balance. Eduated pt and daughter on compensatory tehcniques for low vision and field cut deficits (i.e. anchors to L, contrast colors, and lighting).    Mobility  Overal bed mobility: Needs Assistance Bed Mobility: Supine to Sit, Sit to Supine Supine to sit: Min guard, HOB elevated Sit to supine: Mod assist General bed mobility comments: in recliner    Transfers  Overall transfer level: Needs assistance Equipment used: Rolling walker (2 wheeled) Transfers: Sit to/from Stand Sit to Stand: Min guard Stand pivot transfers: Max assist General transfer comment: cues for hand placement    Ambulation / Gait / Stairs / Wheelchair Mobility  Ambulation/Gait Ambulation/Gait assistance: Museum/gallery curator (Feet): 200 Feet Assistive device: Rolling walker (2 wheeled) Gait Pattern/deviations: Step-through pattern, Decreased stride length, Shuffle, Drifts right/left General Gait Details: min A for balance with ambulation, navigating obstacles in room and for  direction with preference for R side, cues for step length as initially shorter on L, demonstrates increased speed and some veering with walker Gait velocity: slowed Gait velocity interpretation: Below normal speed for age/gender    Posture / Balance Dynamic Sitting Balance Sitting balance - Comments: leaning to L in sitting at EOB, son supporing Balance Overall balance assessment: Needs assistance Sitting-balance support: Feet supported, Bilateral upper extremity supported Sitting balance-Leahy Scale: Fair Sitting balance - Comments: leaning to L in sitting at EOB, son supporing Postural control: Left lateral lean, Posterior lean Standing balance support: Bilateral upper extremity supported Standing balance-Leahy Scale: Fair Standing balance comment: able to achieve static standing with no UE support    Special needs/care consideration BiPAP/CPAP No CPM No Continuous Drip IV No Dialysis No       Life Vest No Oxygen No Special Bed No Trach Size No Wound Vac (area) No     Skin Dry, thin skin that tears easily                           Bowel mgmt: Last BM 03/23/17 Bladder mgmt: Voiding up on BSC  Diabetic mgmt No    Previous Home Environment Living Arrangements: Alone  Lives With: Alone Available Help at Discharge: Family, Available PRN/intermittently Type of Home: House Home Layout: One level Home Access: Stairs to enter [employer] of Steps: 3 in front, 1 in back Bathroom Shower/Tub: Chiropodist: Roe: No  Discharge Living Setting Plans for Discharge Living Setting: Patient's home, Alone, House(Lives alone.) Type of Home at Discharge: House Discharge Home Layout: Multi-level(Split level home) Alternate Level Stairs-Number of Steps: 5-6 steps up to bedrooms/bathroom and 5-6 steps down to bedroom/full bath, laundry and family room. Discharge Home Access: Stairs to enter, Level entry Entrance Stairs-Number of  Steps: 2 steps at the front and ramp at the back door. Does the patient have any problems obtaining your medications?: No  Social/Family/Support Systems Patient Roles: Parent(Has 2 daughters and 1 son.) Contact Information: [name] - daughter - [phone] Anticipated Caregiver: Family Ability/Limitations of Caregiver: Dtr [name] does not work.  Son works.  Another daughter lives in  [place]. Caregiver Availability: Intermittent Discharge Plan Discussed with Primary Caregiver: Yes Is Caregiver In Agreement with Plan?: Yes Does Caregiver/Family have Issues with Lodging/Transportation while Pt is in Rehab?: No  Goals/Additional Needs Patient/Family Goal for Rehab: PT/OT supervision to min assist, SLP supervision goals Expected length of stay: 20-25 days Cultural Considerations: [employer] Dietary Needs: Dys 3, thin liquids Equipment Needs: TBD Pt/Family Agrees to Admission and willing to participate: Yes Program Orientation Provided & Reviewed with Pt/Caregiver Including Roles  & Responsibilities: Yes  Decrease burden of Care through IP rehab admission: N/A  Possible need for SNF placement upon discharge: Not planned  Patient Condition: This patient's medical and functional status has changed since the consult dated: 03/19/17 in which the Rehabilitation Physician determined and documented that the patient's condition is appropriate for intensive rehabilitative care in an inpatient rehabilitation facility. See "History of Present Illness" (above) for medical update. Functional changes are: Currently requiring min assist to ambulate 200 feet RW. Patient's medical and functional status update has been discussed with the Rehabilitation physician and patient remains appropriate for inpatient rehabilitation. Will admit to inpatient rehab today.  Preadmission Screen Completed By:  Retta Diones, 03/24/2017 1:38 PM ______________________________________________________________________    Discussed status with Dr. Posey Pronto on 03/24/17 at 1341 and received telephone approval for admission today.  Admission Coordinator:  Retta Diones, time 1347/Date 03/24/17             Cosigned by: Jamse Arn, MD at 03/24/2017 2:04 PM  Revision History

## 2017-03-24 NOTE — Progress Notes (Signed)
Jamse Arn, MD  Physician  Physical Medicine and Rehabilitation  Consult Note  Signed  Date of Service:  03/19/2017 12:56 PM       Related encounter: ED to Hosp-Admission (Current) from 03/18/2017 in [facility] [facility]      Signed      Expand All Collapse All       [] Hide copied text  [] Hover for details        Physical Medicine and Rehabilitation Consult  Reason for Consult: stroke with functional deficits Referring Physician: Dr. Ree Kida    HPI: [name] [name] is a 84 y.o. female with history of HTN, SVT, CAD, left eye blindness who was admitted on 03/18/17 with sudden loss of vision in right eye, left facial numbness and elevated BP. History taken from chart review and family. CTA head/neck showed new short segment occlusion of R-PCA at origin with new severe distal left P2 stenosis. Vision improved to baseline and tPA not given. MRI brain reviewed, showing right brainstem infarct.  Per report, acute/early subacute infarct in right anteromedial midbrain with involvement of right cerebral peduncle and moderate chronic small vessel disease with moderate parenchymal brain volume loss. She was admitted for work up and OT evaluation this am revealed difficulty with sequencing ADL tasks, disorientation and mild left sided weakness with balance deficits. PTA, pt was independent at home, living by herself.  CIR recommended due to functional deficits.   Review of Systems  Constitutional: Positive for malaise/fatigue. Negative for chills and fever.  HENT: Negative for hearing loss and tinnitus.   Eyes: Negative for redness.       Blind in left eye and decreased vision right eye  Respiratory: Negative for shortness of breath.   Cardiovascular: Negative for chest pain and palpitations.  Gastrointestinal: Negative for abdominal pain and heartburn.  Genitourinary: Negative for dysuria.  Musculoskeletal: Positive for myalgias.  Neurological: Positive for  sensory change (LUE) and headaches (since admission).  Psychiatric/Behavioral: Positive for memory loss.  All other systems reviewed and are negative.         Past Medical History:  Diagnosis Date  . Arthritis    "thumbs, joints" (08/07/2015)  . Basal cell carcinoma    "several; scattered over my face, hands, leg some cut off; some burned off"  . Blind left eye    a. Corneal transplant x3 with rejection  . CAD (coronary artery disease)    a. s/p NSTEMI 2015 treated conservatively; nuclear stress test low risk. b. Continued angina despite med rx 07/2015 - s/p io [place] Stent to ramus intermedius with mod LAD stenosis neg by FFR.  Marland Kitchen Chronic right hip pain   . Complication of anesthesia    "very sensitive to RX"  . Diverticulitis large intestine   . Diverticulosis   . Gastritis   . GERD (gastroesophageal reflux disease)   . Hayfever   . Helicobacter pylori (H. pylori) 05/22/02   RUT-Positive  . Hypertension   . Myocardial infarction ([place]) 2015  . Pre-diabetes   . Squamous carcinoma    "several; scattered over my face, hands, leg some cut off; some burned off"  . Stroke [facility]) 2015   denies residual on 08/07/2015  . SVT (supraventricular tachycardia) (HCC)    a. seen by Dr. Caryl Comes - has loop recorder in. Patient has declined amiodarone due to side effect profile  . Varicose veins          Past Surgical History:  Procedure Laterality Date  . BASAL CELL  CARCINOMA EXCISION    . CARDIAC CATHETERIZATION N/A 08/07/2015   Procedure: Left Heart Cath and Coronary Angiography;  Surgeon: [name], MD;  Location: [place] [facility];  Service: Cardiovascular;  Laterality: N/A;  . CARDIAC CATHETERIZATION N/A 08/07/2015   Procedure: Coronary Stent Intervention;  Surgeon: [name], MD;  Location: [place] [facility];  Service: Cardiovascular;  Laterality: N/A;  . CARDIAC CATHETERIZATION N/A 08/07/2015   Procedure: Intravascular Pressure Wire/FFR Study;   Surgeon: [name], MD;  Location: [place] [facility];  Service: Cardiovascular;  Laterality: N/A;  . CATARACT EXTRACTION W/ INTRAOCULAR LENS  IMPLANT, BILATERAL Bilateral 2005  . CLOSED REDUCTION SHOULDER DISLOCATION Left 2016 X 2  . CORNEAL TRANSPLANT  right 2009, left 2009   both eyes, 3 in left eye, 1 in right eye  . CORONARY ANGIOPLASTY WITH STENT PLACEMENT  08/07/2015   "1 stent"  . EYE SURGERY    . JOINT REPLACEMENT    . [place]   "had gangrene in it"  . LOOP RECORDER IMPLANT N/A 01/05/2014   Procedure: LOOP RECORDER IMPLANT;  Surgeon: Coralyn Mark, MD;  Location: [place] [facility];  Service: Cardiovascular;  Laterality: N/A;  . SQUAMOUS CELL CARCINOMA EXCISION    . TONSILLECTOMY    . TOTAL ABDOMINAL HYSTERECTOMY  11/1983   "ovaries and all"  . TOTAL KNEE ARTHROPLASTY Left 06/27/2012   Procedure: LEFT TOTAL KNEE ARTHROPLASTY;  Surgeon: Gearlean Alf, MD;  Location: [facility];  Service: Orthopedics;  Laterality: Left;  . TOTAL KNEE ARTHROPLASTY Right 12/12/2012   Procedure: RIGHT TOTAL KNEE ARTHROPLASTY;  Surgeon: Gearlean Alf, MD;  Location: [facility];  Service: Orthopedics;  Laterality: Right;  . TUBAL LIGATION  1966         Family History  Problem Relation Age of Onset  . Stroke Mother   . Heart attack Father   . Heart attack Brother   . Cancer Brother   . Heart attack Brother   . Heart attack Brother   . Heart attack Brother   . Parkinsonism Brother     Social History: Lives alone and independent PTA.  She  reports that  has never smoked. she has never used smokeless tobacco. She reports that she does not drink alcohol or use drugs.             Allergies  Allergen Reactions  . Contrast Media [Iodinated Diagnostic Agents] Anaphylaxis  . Fluorescein Anaphylaxis, Shortness Of Breath, Itching and Swelling    Tongue became swollen  . Other Anaphylaxis, Shortness Of Breath, Swelling and Other (See Comments)     Yellow eye dye that is used to check retina (Fluorescein) Caused swelling of tongue  . Sulfa Antibiotics Rash  . Ultram [Tramadol] Hives  . Buprenex [Buprenorphine Hcl] Palpitations and Other (See Comments)    Heart raced  . Buprenorphine Hcl Other (See Comments)    Impacted blood pressure (high or low??)  . Keflex [Cephalexin] Diarrhea  . Levofloxacin Other (See Comments)    Reaction not recalled  . Oxycodone-Acetaminophen Nausea And Vomiting  . Yellow Dye Itching, Swelling and Other (See Comments)    Fluorescein (in eye drops) - Tongue became swollen  . Augmentin [Amoxicillin-Pot Clavulanate] Nausea And Vomiting  . Hydralazine Hcl Swelling, Palpitations and Rash    Patient developed facial flushing, swelling in legs.  . Levofloxacin Other (See Comments)    Reaction not recalled  . Percocet [Oxycodone-Acetaminophen] Nausea And Vomiting and Other (See Comments)    "  Felt drained," also  . Tape Other (See Comments)    Adhesive tape pulls off the skin  . Zetia [Ezetimibe] Other (See Comments)    Muscle weakness          Medications Prior to Admission  Medication Sig Dispense Refill  . amLODipine (NORVASC) 2.5 MG tablet Take 1 tablet (2.5 mg total) by mouth daily as needed (BLOOD PRESSURE). Only take if greater than 140 (Patient taking differently: Take 2.5 mg by mouth daily as needed (for blood pressure/take only if Systolic reading is greater than 180). ) 90 tablet 3  . clopidogrel (PLAVIX) 75 MG tablet take 1 tablet by mouth once daily (Patient taking differently: Take 75 mg by mouth once a day) 90 tablet 3  . hyoscyamine (LEVSIN, ANASPAZ) 0.125 MG tablet Take 1 tablet (0.125 mg total) by mouth every 4 (four) hours as needed for cramping. 30 tablet 4  . Magnesium 250 MG TABS Take 250 mg by mouth at bedtime.    . Melatonin 1 MG SUBL Place 1 mg under the tongue at bedtime.    . Multiple Vitamin (MULTI VITAMIN DAILY PO) Take 1 tablet by mouth every morning.     . Multiple Vitamins-Minerals (PRESERVISION AREDS 2) CAPS Take 1 capsule by mouth daily after breakfast.    . nebivolol (BYSTOLIC) 5 MG tablet Take one tablet by mouth daily.  If SBP is greater than 180 you can take an additional 5mg  daily as needed (Patient taking differently: Take 5-10 mg by mouth See admin instructions. 5 mg once a day at 6 PM and take an additional 5 mg once a day if needed if Systolic reading is greater than 180) 60 tablet 6  . nitroGLYCERIN (NITROSTAT) 0.4 MG SL tablet Place 1 tablet (0.4 mg total) under the tongue every 5 (five) minutes as needed for chest pain. 25 tablet 2  . Polyethyl Glycol-Propyl Glycol (SYSTANE ULTRA) 0.4-0.3 % SOLN Apply 1 drop to eye 2 (two) times daily as needed (for irritation).     Vladimir Faster Glycol-Propyl Glycol (SYSTANE) 0.4-0.3 % GEL ophthalmic gel Place 1 application into both eyes at bedtime.    . prednisoLONE acetate (PRED FORTE) 1 % ophthalmic suspension Place 1 drop into both eyes See admin instructions. 1 drop into both eyes at bedtime spaced 5 minutes apart from Systane gel    . ranitidine (ZANTAC) 150 MG tablet      Home: Home Living Family/patient expects to be discharged to:: Private residence Living Arrangements: Alone Available Help at Discharge: Family, Available PRN/intermittently Type of Home: House Home Access: Stairs to enter [employer] of Steps: 3 in front, 1 in back Home Layout: One level Bathroom Shower/Tub: Chiropodist: Standard Home Equipment: None  Functional History: Prior Function Level of Independence: Independent Comments: does not drive but independent with all ADL Functional Status:  Mobility: Bed Mobility Overal bed mobility: Needs Assistance Bed Mobility: Supine to Sit, Sit to Supine Supine to sit: Min assist Sit to supine: Min assist General bed mobility comments: Assist for trunk elevation to sitting and for LEs back to bed. Transfers Overall transfer  level: Needs assistance Equipment used: Rolling walker (2 wheeled), 1 person hand held assist Transfers: Sit to/from Stand, Stand Pivot Transfers Sit to Stand: Mod assist Stand pivot transfers: Max assist General transfer comment: Assist to boost up from EOBx1, BSC x1. Stand pivot to R side with max cues for sequencing and safety.  ADL: ADL Overall ADL's : Needs assistance/impaired Eating/Feeding: Minimal assistance,  Sitting Grooming: Moderate assistance, Sitting Upper Body Bathing: Moderate assistance, Sitting Lower Body Bathing: Maximal assistance, Sit to/from stand Upper Body Dressing : Moderate assistance, Sitting Lower Body Dressing: Maximal assistance, Sit to/from stand Toilet Transfer: Maximal assistance, Stand-pivot, BSC Toilet Transfer Details (indicate cue type and reason): Attempted with and without RW. Toileting- Clothing Manipulation and Hygiene: Minimal assistance, Sitting/lateral lean Toileting - Clothing Manipulation Details (indicate cue type and reason): for peri care, min assist for balance Functional mobility during ADLs: Maximal assistance(for stand pivot only)  Cognition: Cognition Overall Cognitive Status: Impaired/Different from baseline Orientation Level: Oriented X4 Cognition Arousal/Alertness: Lethargic Behavior During Therapy: Flat affect Overall Cognitive Status: Impaired/Different from baseline Area of Impairment: Orientation, Memory, Following commands, Safety/judgement, Problem solving Orientation Level: Disoriented to, Time, Place, Situation Memory: Decreased short-term memory Following Commands: Follows one step commands inconsistently, Follows one step commands with increased time Safety/Judgement: Decreased awareness of safety, Decreased awareness of deficits Problem Solving: Slow processing, Decreased initiation, Difficulty sequencing, Requires verbal cues, Requires tactile cues   Blood pressure (!) 184/71, pulse 70, temperature 97.6 F  (36.4 C), temperature source Oral, resp. rate 20, SpO2 99 %. Physical Exam  Nursing note and vitals reviewed. Constitutional: She is oriented to person, place, and time. She appears well-developed and well-nourished. No distress.  Lying in bed reaching for items in right field.   HENT:  Head: Normocephalic and atraumatic.  Mouth/Throat: Oropharynx is clear and moist.  Eyes: Right eye exhibits no discharge. Left eye exhibits no discharge.  Left ptosis.   Neck: Normal range of motion. Neck supple.  Cardiovascular: Normal rate and regular rhythm.  Respiratory: Effort normal and breath sounds normal. No stridor. She has no wheezes. She has no rales.  GI: Soft. Bowel sounds are normal. She exhibits no distension. There is no tenderness.  Musculoskeletal: She exhibits tenderness. She exhibits no edema.  Neurological: She is alert and oriented to person, place, and time.  Mild dysarthria.  Disoriented and distracted needing redirection.  She was able to follow simple motor commands without difficulty.  Sensation diminished to light touch left fingertips and lips Motor: 5/5 throughout Unable to assess ataxia/dysmetria due to visual deficits Some receptive deficits notes Confusion  Skin: Skin is warm and dry. She is not diaphoretic.  Psychiatric: She has a normal mood and affect. Her speech is tangential. She is slowed. Cognition and memory are impaired. She expresses impulsivity. She is inattentive.    LabResultsLast24Hours       Results for orders placed or performed during the hospital encounter of 03/18/17 (from the past 24 hour(s))  Protime-INR     Status: None   Collection Time: 03/18/17  3:45 PM  Result Value Ref Range   Prothrombin Time 12.4 11.4 - 15.2 seconds   INR 0.93   APTT     Status: None   Collection Time: 03/18/17  3:45 PM  Result Value Ref Range   aPTT 26 24 - 36 seconds  CBC     Status: Abnormal   Collection Time: 03/18/17  3:45 PM  Result Value Ref  Range   WBC 5.0 4.0 - 10.5 K/uL   RBC 4.78 3.87 - 5.11 MIL/uL   Hemoglobin 15.2 (H) 12.0 - 15.0 g/dL   HCT 47.1 (H) 36.0 - 46.0 %   MCV 98.5 78.0 - 100.0 fL   MCH 31.8 26.0 - 34.0 pg   MCHC 32.3 30.0 - 36.0 g/dL   RDW 13.4 11.5 - 15.5 %   Platelets 180 150 - 400  K/uL  Differential     Status: None   Collection Time: 03/18/17  3:45 PM  Result Value Ref Range   Neutrophils Relative % 50 %   Neutro Abs 2.5 1.7 - 7.7 K/uL   Lymphocytes Relative 42 %   Lymphs Abs 2.1 0.7 - 4.0 K/uL   Monocytes Relative 7 %   Monocytes Absolute 0.4 0.1 - 1.0 K/uL   Eosinophils Relative 1 %   Eosinophils Absolute 0.1 0.0 - 0.7 K/uL   Basophils Relative 0 %   Basophils Absolute 0.0 0.0 - 0.1 K/uL  Comprehensive metabolic panel     Status: Abnormal   Collection Time: 03/18/17  3:45 PM  Result Value Ref Range   Sodium 138 135 - 145 mmol/L   Potassium 3.7 3.5 - 5.1 mmol/L   Chloride 101 101 - 111 mmol/L   CO2 24 22 - 32 mmol/L   Glucose, Bld 165 (H) 65 - 99 mg/dL   BUN 16 6 - 20 mg/dL   Creatinine, Ser 0.74 0.44 - 1.00 mg/dL   Calcium 9.2 8.9 - 10.3 mg/dL   Total Protein 7.1 6.5 - 8.1 g/dL   Albumin 4.0 3.5 - 5.0 g/dL   AST 33 15 - 41 U/L   ALT 24 14 - 54 U/L   Alkaline Phosphatase 105 38 - 126 U/L   Total Bilirubin 0.5 0.3 - 1.2 mg/dL   GFR calc non Af Amer >60 >60 mL/min   GFR calc Af Amer >60 >60 mL/min   Anion gap 13 5 - 15  I-stat troponin, ED     Status: None   Collection Time: 03/18/17  3:49 PM  Result Value Ref Range   Troponin i, poc 0.00 0.00 - 0.08 ng/mL   Comment 3          I-Stat Chem 8, ED     Status: Abnormal   Collection Time: 03/18/17  3:50 PM  Result Value Ref Range   Sodium 137 135 - 145 mmol/L   Potassium 4.0 3.5 - 5.1 mmol/L   Chloride 99 (L) 101 - 111 mmol/L   BUN 23 (H) 6 - 20 mg/dL   Creatinine, Ser 0.80 0.44 - 1.00 mg/dL   Glucose, Bld 166 (H) 65 - 99 mg/dL   Calcium, Ion 1.14 (L) 1.15 - 1.40 mmol/L   TCO2 31 22  - 32 mmol/L   Hemoglobin 16.3 (H) 12.0 - 15.0 g/dL   HCT 48.0 (H) 36.0 - 46.0 %  Hemoglobin A1c     Status: None   Collection Time: 03/19/17  4:56 AM  Result Value Ref Range   Hgb A1c MFr Bld 5.3 4.8 - 5.6 %   Mean Plasma Glucose 105.41 mg/dL  CBC with Differential/Platelet     Status: None   Collection Time: 03/19/17  4:56 AM  Result Value Ref Range   WBC 5.5 4.0 - 10.5 K/uL   RBC 4.51 3.87 - 5.11 MIL/uL   Hemoglobin 14.3 12.0 - 15.0 g/dL   HCT 43.2 36.0 - 46.0 %   MCV 95.8 78.0 - 100.0 fL   MCH 31.7 26.0 - 34.0 pg   MCHC 33.1 30.0 - 36.0 g/dL   RDW 13.2 11.5 - 15.5 %   Platelets 170 150 - 400 K/uL   Neutrophils Relative % 66 %   Neutro Abs 3.6 1.7 - 7.7 K/uL   Lymphocytes Relative 25 %   Lymphs Abs 1.4 0.7 - 4.0 K/uL   Monocytes Relative 9 %   Monocytes  Absolute 0.5 0.1 - 1.0 K/uL   Eosinophils Relative 0 %   Eosinophils Absolute 0.0 0.0 - 0.7 K/uL   Basophils Relative 0 %   Basophils Absolute 0.0 0.0 - 0.1 K/uL  Basic metabolic panel     Status: Abnormal   Collection Time: 03/19/17  4:56 AM  Result Value Ref Range   Sodium 132 (L) 135 - 145 mmol/L   Potassium 3.3 (L) 3.5 - 5.1 mmol/L   Chloride 98 (L) 101 - 111 mmol/L   CO2 25 22 - 32 mmol/L   Glucose, Bld 118 (H) 65 - 99 mg/dL   BUN 9 6 - 20 mg/dL   Creatinine, Ser 0.49 0.44 - 1.00 mg/dL   Calcium 8.7 (L) 8.9 - 10.3 mg/dL   GFR calc non Af Amer >60 >60 mL/min   GFR calc Af Amer >60 >60 mL/min   Anion gap 9 5 - 15  Lipid panel     Status: Abnormal   Collection Time: 03/19/17  4:56 AM  Result Value Ref Range   Cholesterol 197 0 - 200 mg/dL   Triglycerides 86 <150 mg/dL   HDL 62 >40 mg/dL   Total CHOL/HDL Ratio 3.2 RATIO   VLDL 17 0 - 40 mg/dL   LDL Cholesterol 118 (H) 0 - 99 mg/dL      ImagingResults(Last48hours)  Ct Angio Head W Or Wo Contrast  Result Date: 03/18/2017 CLINICAL DATA:  Left-sided weakness and facial droop. EXAM: CT ANGIOGRAPHY HEAD AND NECK  TECHNIQUE: Multidetector CT imaging of the head and neck was performed using the standard protocol during bolus administration of intravenous contrast. Multiplanar CT image reconstructions and MIPs were obtained to evaluate the vascular anatomy. Carotid stenosis measurements (when applicable) are obtained utilizing NASCET criteria, using the distal internal carotid diameter as the denominator. CONTRAST:  50 mL Isovue 370 COMPARISON:  Head CT today. Head MRI/ MRA 01/04/2014. Soft tissue neck CT 10/12/2007. FINDINGS: CTA NECK FINDINGS Aortic arch: Standard 3 vessel aortic arch with mild atherosclerotic plaque. Widely patent brachiocephalic and subclavian arteries. Tortuous subclavian arteries. Right carotid system: Widely patent without evidence of stenosis or dissection. Small amount of calcified plaque at the carotid bifurcation and in the distal cervical ICA. Tortuous proximal common carotid artery. Left carotid system: Patent without evidence of stenosis or dissection. Tortuous common and internal carotid arteries. Partially retropharyngeal course of the ICA. Vertebral arteries: Patent without evidence of stenosis or dissection. Dominant left vertebral artery. Skeleton: Moderate cervical disc degeneration. Other neck: No mass or lymph node enlargement. Upper chest: Partially visualize mosaic attenuation in the lung apices which could reflect small vessel or airway disease. Review of the MIP images confirms the above findings CTA HEAD FINDINGS Anterior circulation: The internal carotid arteries are patent from skullbase to carotid termini. There is mild siphon atherosclerosis bilaterally without stenosis. ACAs and MCAs are patent with mild atherosclerotic irregularity but no significant proximal stenosis or proximal branch occlusion. No aneurysm. Posterior circulation: The intracranial vertebral arteries are widely patent to the basilar. The right PICA and left AICA are dominant. Patent SCA origins are seen  bilaterally. The basilar artery is widely patent. There is new occlusion of the right PCA at its origin over a length of 4 mm with reconstitution of the more distal P1 segment and beyond. The left PCA is patent without significant proximal stenosis, however there is a new moderate distal left P2 stenosis. Distal PCA branch vessel irregularity and attenuation are noted bilaterally. No aneurysm. Venous sinuses: Patent. Anatomic variants:  None. Delayed phase: Not performed. Review of the MIP images confirms the above findings IMPRESSION: 1. New short segment occlusion of the right PCA at its origin. 2. New severe distal left P2 stenosis. 3. No large vessel occlusion or significant proximal stenosis in the anterior circulation. 4. Widely patent cervical carotid and vertebral arteries. 5.  Aortic Atherosclerosis (ICD10-I70.0). These results were called by telephone at the time of interpretation on 03/18/2017 at 4:22 pm to Dr. Rory Percy, who verbally acknowledged these results. Electronically Signed   By: Logan Bores M.D.   On: 03/18/2017 16:38   Ct Angio Neck W Or Wo Contrast  Result Date: 03/18/2017 CLINICAL DATA:  Left-sided weakness and facial droop. EXAM: CT ANGIOGRAPHY HEAD AND NECK TECHNIQUE: Multidetector CT imaging of the head and neck was performed using the standard protocol during bolus administration of intravenous contrast. Multiplanar CT image reconstructions and MIPs were obtained to evaluate the vascular anatomy. Carotid stenosis measurements (when applicable) are obtained utilizing NASCET criteria, using the distal internal carotid diameter as the denominator. CONTRAST:  50 mL Isovue 370 COMPARISON:  Head CT today. Head MRI/ MRA 01/04/2014. Soft tissue neck CT 10/12/2007. FINDINGS: CTA NECK FINDINGS Aortic arch: Standard 3 vessel aortic arch with mild atherosclerotic plaque. Widely patent brachiocephalic and subclavian arteries. Tortuous subclavian arteries. Right carotid system: Widely patent  without evidence of stenosis or dissection. Small amount of calcified plaque at the carotid bifurcation and in the distal cervical ICA. Tortuous proximal common carotid artery. Left carotid system: Patent without evidence of stenosis or dissection. Tortuous common and internal carotid arteries. Partially retropharyngeal course of the ICA. Vertebral arteries: Patent without evidence of stenosis or dissection. Dominant left vertebral artery. Skeleton: Moderate cervical disc degeneration. Other neck: No mass or lymph node enlargement. Upper chest: Partially visualize mosaic attenuation in the lung apices which could reflect small vessel or airway disease. Review of the MIP images confirms the above findings CTA HEAD FINDINGS Anterior circulation: The internal carotid arteries are patent from skullbase to carotid termini. There is mild siphon atherosclerosis bilaterally without stenosis. ACAs and MCAs are patent with mild atherosclerotic irregularity but no significant proximal stenosis or proximal branch occlusion. No aneurysm. Posterior circulation: The intracranial vertebral arteries are widely patent to the basilar. The right PICA and left AICA are dominant. Patent SCA origins are seen bilaterally. The basilar artery is widely patent. There is new occlusion of the right PCA at its origin over a length of 4 mm with reconstitution of the more distal P1 segment and beyond. The left PCA is patent without significant proximal stenosis, however there is a new moderate distal left P2 stenosis. Distal PCA branch vessel irregularity and attenuation are noted bilaterally. No aneurysm. Venous sinuses: Patent. Anatomic variants: None. Delayed phase: Not performed. Review of the MIP images confirms the above findings IMPRESSION: 1. New short segment occlusion of the right PCA at its origin. 2. New severe distal left P2 stenosis. 3. No large vessel occlusion or significant proximal stenosis in the anterior circulation. 4. Widely  patent cervical carotid and vertebral arteries. 5.  Aortic Atherosclerosis (ICD10-I70.0). These results were called by telephone at the time of interpretation on 03/18/2017 at 4:22 pm to Dr. Rory Percy, who verbally acknowledged these results. Electronically Signed   By: Logan Bores M.D.   On: 03/18/2017 16:38   Mr Brain Wo Contrast  Result Date: 03/18/2017 CLINICAL DATA:  84 y/o F; stroke for follow-up. Left-sided weakness and facial droop. EXAM: MRI HEAD WITHOUT CONTRAST TECHNIQUE: Multiplanar, multiecho pulse  sequences of the brain and surrounding structures were obtained without intravenous contrast. COMPARISON:  03/18/2017 CT head and CT angiogram head. 12/25/2013 MRI head. FINDINGS: Brain: Focus of reduced diffusion spanning 12 mm centered in the right anteromedial midbrain and extending into the right cerebral peduncle (series 3, image 21 and 22). No associated hemorrhage or mass effect. Numerous nonspecific foci of T2 FLAIR hyperintense signal abnormality in subcortical and periventricular white matter are compatible with moderate chronic microvascular ischemic changes for age. Moderate brain parenchymal volume loss. Small chronic cortical infarcts are present in bilateral parietal lobes and the left frontal lobe. Vascular: Negative. Skull and upper cervical spine: Normal marrow signal. Sinuses/Orbits: Underpneumatized frontal sinuses. Mild mucosal thickening of sphenoid and ethmoid sinuses. Trace effusions and mastoid tips. Left phthisis bulbi. Right intra-ocular lens replacement. Other: None. IMPRESSION: 1. 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 No associated hemorrhage or mass effect. 2. Moderate chronic microvascular ischemic changes and moderate parenchymal volume loss of the brain. Small chronic cortical infarcts are present in bilateral parietal and left frontal lobes. These results will be called to the ordering  clinician or representative by the Radiologist Assistant, and communication documented in the PACS or zVision Dashboard. Electronically Signed   By: Kristine Garbe M.D.   On: 03/18/2017 22:14   Ct Head Code Stroke Wo Contrast`  Addendum Date: 03/18/2017   ADDENDUM REPORT: 03/18/2017 16:13 ADDENDUM: These results were called by telephone at the time of interpretation on 03/18/2017 at 4:13 pm to Dr. Rory Percy, who verbally acknowledged these results. Electronically Signed   By: Franchot Gallo M.D.   On: 03/18/2017 16:13   Result Date: 03/18/2017 CLINICAL DATA:  Code stroke.  Left-sided weakness, facial droop EXAM: CT HEAD WITHOUT CONTRAST TECHNIQUE: Contiguous axial images were obtained from the base of the skull through the vertex without intravenous contrast. COMPARISON:  MRI and CT 01/04/2014 FINDINGS: Brain: Mild atrophy. Negative for hydrocephalus. Patchy white matter hyperintensity bilaterally appears chronic and unchanged. Negative for hemorrhage, acute infarct, or mass lesion. Vascular: Negative for hyperdense vessel Skull: Negative Sinuses/Orbits: Negative Other: None ASPECTS ([place] Stroke Program Early CT Score) - Ganglionic level infarction (caudate, lentiform nuclei, internal capsule, insula, M1-M3 cortex): 7 - Supraganglionic infarction (M4-M6 cortex): 3 Total score (0-10 with 10 being normal): 10 IMPRESSION: 1. No acute abnormality. Atrophy and chronic microvascular ischemia in the white matter 2. ASPECTS is 10 Electronically Signed: By: Franchot Gallo M.D. On: 03/18/2017 15:56     Assessment/Plan: Diagnosis: right anteromedial midbrain Labs and images independently reviewed.  Records reviewed and summated above. Stroke: Continue secondary stroke prophylaxis and Risk Factor Modification listed below:   Antiplatelet therapy:   Blood Pressure Management:  Continue current medication with prn's with permisive HTN per primary team Statin Agent:    1. Does the need for close,  24 hr/day medical supervision in concert with the patient's rehab needs make it unreasonable for this patient to be served in a less intensive setting? Yes  2. Co-Morbidities requiring supervision/potential complications: HTN (monitor and provide prns in accordance with increased physical exertion and pain), SVT (cont to monitor HR with increased physical activity), CAD (cont meds, left eye blindness, hypokalemia (continue to monitor and replete as necessary), hyponatremia (cont to monitor, treat if necessary) 3. Due to bladder management, bowel management, safety, skin/wound care, disease management, medication administration, pain management and patient education, does the patient require 24 hr/day rehab nursing? Yes 4. Does the patient require coordinated care of  a physician, rehab nurse, PT (1-2 hrs/day, 5 days/week), OT (1-2 hrs/day, 5 days/week) and SLP (1-2 hrs/day, 5 days/week) to address physical and functional deficits in the context of the above medical diagnosis(es)? Yes Addressing deficits in the following areas: balance, endurance, locomotion, transferring, bathing, dressing, toileting, cognition, speech, swallowing and psychosocial support 5. Can the patient actively participate in an intensive therapy program of at least 3 hrs of therapy per day at least 5 days per week? Yes 6. The potential for patient to make measurable gains while on inpatient rehab is excellent 7. Anticipated functional outcomes upon discharge from inpatient rehab are supervision and min assist  with PT, supervision with OT, supervision with SLP. 8. Estimated rehab length of stay to reach the above functional goals is: 20-25 days. 9. Anticipated D/C setting: Home 10. Anticipated post D/C treatments: HH therapy and Home excercise program 11. Overall Rehab/Functional Prognosis: good  RECOMMENDATIONS: This patient's condition is appropriate for continued rehabilitative care in the following setting: Likely CIR.  Will  await PT eval. Patient has agreed to participate in recommended program. Yes Note that insurance prior authorization may be required for reimbursement for recommended care.  Comment: Rehab Admissions Coordinator to follow up.  Delice Lesch, MD, ABPMR [facility], [place] 03/19/2017          Revision History                   Routing History

## 2017-03-24 NOTE — Plan of Care (Signed)
No  acute events at this time. 

## 2017-03-24 NOTE — Care Management Note (Signed)
Case Management Note  Patient Details  Name: [name] [name] MRN: [id number] Date of Birth: [birth year]  Subjective/Objective:                    Action/Plan: Pt discharging to CIR today. No further needs per CM.  Expected Discharge Date:  03/24/17               Expected Discharge Plan:  [place]  In-House Referral:     Discharge planning Services  CM Consult  Post Acute Care Choice:    Choice offered to:     DME Arranged:    DME Agency:     HH Arranged:    HH Agency:     Status of Service:  Completed, signed off  If discussed at [employer], dates discussed:    Additional Comments:  Pollie Friar, RN 03/24/2017, 1:36 PM

## 2017-03-24 NOTE — Discharge Summary (Signed)
Physician Discharge Summary  [name] [name] VZD:[id number] DOB: 1925/02/03 DOA: 03/18/2017  PCP: [name] Austin, MD  Admit date: 03/18/2017 Discharge date: 03/24/2017  Time spent: 45 minutes  Recommendations for Outpatient Follow-up:  Patient will be discharged to inpatient rehab. Continue physical, occupational, and speech therapy.  Patient will need to follow up with primary care provider within one week of discharge.  Follow up with neurology, Dr. Jannifer Franklin. Follow up with cardiology. Patient should continue medications as prescribed.  Patient should follow a dysphagia 3 diet.   Discharge Diagnoses:  Acute CVA Essential hypertension History of SVT versus atrial fibrillation CAD GERD  Fuch's disease Hypokalemia Headache   Discharge Condition: Stable  Diet recommendation: dysphagia 3  There were no vitals filed for this visit.  History of present illness:  On 03/18/2017 by Dr. Lesia [name] [name] [name] is a 84 y.o. 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 Daughter also noted a mild L facial droop and also reported PTA, pt SBP has been >200 at home. Pt took an amlodipine and NTG and was brought to the ED. Pt reported associated headache, denied any extremity weakness, N/V/D/C, fever/chills, chest pain, SOB, abdominal pain. Patient admitted for further management   Hospital Course:  Acute CVA -CT head showed no acute abnormality -MRI brain showed acute/early subacute infarction centered in the right anterior medial midbrain with involvement of the right cerebral peduncle. -CTA head: New short segment occlusion of the right PCA. No severe distal left P2 stenosis. No large vessel occlusion or shaking proximal stenosis in the anterior circulation. -LDL 118,  hemoglobin A1c 5.3 -Echocardiogram 29-51%, LV diastolic function parameters were normal. No cardiac source of emboli. -carotid doppler 1-39% B/L ICA stenosis. Vertebral arteries patent with antegrade flow. -Continue statin, Plavix -Neurology consulted and appreciated- continue DAPT for 3 weeks, then plavix alone thereafter, continue statin. Follow up with Dr. Jannifer Franklin. -Discussed with Dr. Leonie Man, no documentation of AF since last stroke in 2015 -PT/OT recommended CIR -CIR consulted- potential candidate, pending approval -Social work consulted for possible SNF  Essential hypertension -Allow for permissive hypertension  History of SVT versus atrial fibrillation -Patient has loop recorder implanted -discussed with neurology, loop interrogated, no PAF currently. Had 10 second seconds of AF in Nov 2016. Has had frequent intermittent SVTs.  -patient will need to follow up with Dr. Burt Knack, cardiologist  CAD -no complaints of chest pain -Continue Plavix, statin  GERD  -Continue H2 blocker   Fuch's disease -patient with left eye blindness  -continue eye drops -recommended patient follow up with her ophthalmologist upon discharge as her prescription may have changed since her CVA  Hypokalemia -resolved, continue to monitor BMP periodically  Headache  -possibly related to above -no longer complaining of headache -was placed on fioricet PRN  Consultants Neurology Inpatient rehab  Procedures  Echcoardiogram Carotid doppler  Discharge Exam: Vitals:   03/24/17 0604 03/24/17 0945  BP: (!) 141/51 (!) 139/55  Pulse: 70 81  Resp: 18 20  Temp: 98.1 F (36.7 C) 98.7 F (37.1 C)  SpO2: 96% 97%   Complains of continued vision loss of the right eye. Denies chest pain, shortness of breath, abdominal pain, N/V/D/C.    General: Well developed, well nourished, NAD, appears younger than stated age  HEENT: NCAT, mucous membranes  moist.  Cardiovascular: S1 S2 auscultated, RRR,  no murmurs  Respiratory: Clear to auscultation bilaterally with equal chest rise  Abdomen: Soft, nontender, nondistended, + bowel sounds  Extremities: warm dry without cyanosis clubbing or edema  Neuro: AAOx3, mild left sided weakness, no new deficits   Psych: Pleasant, appropriate mood and affect  Discharge Instructions Discharge Instructions    Ambulatory referral to Neurology   Complete by:  As directed    An appointment is requested in approximately 6 weeks.   Discharge instructions   Complete by:  As directed    Patient will be discharged to inpatient rehab. Continue physical, occupational, and speech therapy.  Patient will need to follow up with primary care provider within one week of discharge.  Follow up with neurology, Dr. Jannifer Franklin. Follow up with cardiology. Patient should continue medications as prescribed.  Patient should follow a dysphagia 3 diet.     Allergies as of 03/24/2017      Reactions   Contrast Media [iodinated Diagnostic Agents] Anaphylaxis   Fluorescein Anaphylaxis, Shortness Of Breath, Itching, Swelling   Tongue became swollen   Other Anaphylaxis, Shortness Of Breath, Swelling, Other (See Comments)   Yellow eye dye that is used to check retina (Fluorescein) Caused swelling of tongue   Sulfa Antibiotics Rash   Ultram [tramadol] Hives   Buprenex [buprenorphine Hcl] Palpitations, Other (See Comments)   Heart raced   Buprenorphine Hcl Other (See Comments)   Impacted blood pressure (high or low??)   Keflex [cephalexin] Diarrhea   Levofloxacin Other (See Comments)   Reaction not recalled   Oxycodone-acetaminophen Nausea And Vomiting   Yellow Dye Itching, Swelling, Other (See Comments)   Fluorescein (in eye drops) - Tongue became swollen   Augmentin [amoxicillin-pot Clavulanate] Nausea And Vomiting   Hydralazine Hcl Swelling, Palpitations, Rash   Patient developed facial flushing, swelling in legs.   Levofloxacin Other (See Comments)   Reaction not recalled    Percocet [oxycodone-acetaminophen] Nausea And Vomiting, Other (See Comments)   "Felt drained," also   Tape Other (See Comments)   Adhesive tape pulls off the skin   Zetia [ezetimibe] Other (See Comments)   Muscle weakness      Medication List    TAKE these medications   amLODipine 2.5 MG tablet Commonly known as:  NORVASC Take 1 tablet (2.5 mg total) by mouth daily as needed (BLOOD PRESSURE). Only take if greater than 140 What changed:    reasons to take this  additional instructions   aspirin 81 MG EC tablet Take 1 tablet (81 mg total) by mouth daily. Start taking on:  03/25/2017   atorvastatin 40 MG tablet Commonly known as:  LIPITOR Take 1 tablet (40 mg total) by mouth daily at 6 PM.   clopidogrel 75 MG tablet Commonly known as:  PLAVIX take 1 tablet by mouth once daily What changed:    how much to take  how to take this  when to take this   hyoscyamine 0.125 MG tablet Commonly known as:  LEVSIN, ANASPAZ Take 1 tablet (0.125 mg total) by mouth every 4 (four) hours as needed for cramping.   Magnesium 250 MG Tabs Take 250 mg by mouth at bedtime.   Melatonin 1 MG Subl Place 1 mg under the tongue at bedtime.   MULTI VITAMIN DAILY PO Take 1 tablet by mouth every morning.   nebivolol 5 MG tablet Commonly known as:  BYSTOLIC Take one tablet by mouth daily.  If SBP is greater than 180 you  can take an additional 5mg  daily as needed What changed:    how much to take  how to take this  when to take this  additional instructions   nitroGLYCERIN 0.4 MG SL tablet Commonly known as:  NITROSTAT Place 1 tablet (0.4 mg total) under the tongue every 5 (five) minutes as needed for chest pain.   prednisoLONE acetate 1 % ophthalmic suspension Commonly known as:  PRED FORTE Place 1 drop into both eyes See admin instructions. 1 drop into both eyes at bedtime spaced 5 minutes apart from Systane gel   PRESERVISION AREDS 2 Caps Take 1 capsule by mouth daily after  breakfast.   ranitidine 150 MG tablet Commonly known as:  ZANTAC Take 1 tablet (150 mg total) by mouth at bedtime.   SYSTANE ULTRA 0.4-0.3 % Soln Generic drug:  Polyethyl Glycol-Propyl Glycol Apply 1 drop to eye 2 (two) times daily as needed (for irritation).   SYSTANE 0.4-0.3 % Gel ophthalmic gel Generic drug:  Polyethyl Glycol-Propyl Glycol Place 1 application into both eyes at bedtime.      Allergies  Allergen Reactions  . Contrast Media [Iodinated Diagnostic Agents] Anaphylaxis  . Fluorescein Anaphylaxis, Shortness Of Breath, Itching and Swelling    Tongue became swollen  . Other Anaphylaxis, Shortness Of Breath, Swelling and Other (See Comments)    Yellow eye dye that is used to check retina (Fluorescein) Caused swelling of tongue  . Sulfa Antibiotics Rash  . Ultram [Tramadol] Hives  . Buprenex [Buprenorphine Hcl] Palpitations and Other (See Comments)    Heart raced  . Buprenorphine Hcl Other (See Comments)    Impacted blood pressure (high or low??)  . Keflex [Cephalexin] Diarrhea  . Levofloxacin Other (See Comments)    Reaction not recalled  . Oxycodone-Acetaminophen Nausea And Vomiting  . Yellow Dye Itching, Swelling and Other (See Comments)    Fluorescein (in eye drops) - Tongue became swollen  . Augmentin [Amoxicillin-Pot Clavulanate] Nausea And Vomiting  . Hydralazine Hcl Swelling, Palpitations and Rash    Patient developed facial flushing, swelling in legs.  . Levofloxacin Other (See Comments)    Reaction not recalled  . Percocet [Oxycodone-Acetaminophen] Nausea And Vomiting and Other (See Comments)    "Felt drained," also  . Tape Other (See Comments)    Adhesive tape pulls off the skin  . Zetia [Ezetimibe] Other (See Comments)    Muscle weakness   Follow-up Information    Kathrynn Ducking, MD. Schedule an appointment as soon as possible for a visit in 6 week(s).   Specialty:  Neurology Contact information: [address] [place]  [address] [phone]        [name] Austin, MD. Schedule an appointment as soon as possible for a visit in 1 week(s).   Specialty:  Family Medicine Why:  Hospital follow up Contact information: [place] [address] [address] [phone]        [name], MD. Schedule an appointment as soon as possible for a visit.   Specialty:  Cardiology Why:  Hospital follow up Contact information: [address] [place] [address] [phone]            The results of significant diagnostics from this hospitalization (including imaging, microbiology, ancillary and laboratory) are listed below for reference.    Significant Diagnostic Studies: Ct Angio Head W Or Wo Contrast  Result Date: 03/18/2017 CLINICAL DATA:  Left-sided weakness and facial droop. EXAM: CT ANGIOGRAPHY HEAD AND NECK  TECHNIQUE: Multidetector CT imaging of the head and neck was performed using the standard protocol during bolus administration of intravenous contrast. Multiplanar CT image reconstructions and MIPs were obtained to evaluate the vascular anatomy. Carotid stenosis measurements (when applicable) are obtained utilizing NASCET criteria, using the distal internal carotid diameter as the denominator. CONTRAST:  50 mL Isovue 370 COMPARISON:  Head CT today. Head MRI/ MRA 01/04/2014. Soft tissue neck CT 10/12/2007. FINDINGS: CTA NECK FINDINGS Aortic arch: Standard 3 vessel aortic arch with mild atherosclerotic plaque. Widely patent brachiocephalic and subclavian arteries. Tortuous subclavian arteries. Right carotid system: Widely patent without evidence of stenosis or dissection. Small amount of calcified plaque at the carotid bifurcation and in the distal cervical ICA. Tortuous proximal common carotid artery. Left carotid system: Patent without evidence of stenosis or dissection. Tortuous common and internal carotid arteries. Partially retropharyngeal course of the ICA.  Vertebral arteries: Patent without evidence of stenosis or dissection. Dominant left vertebral artery. Skeleton: Moderate cervical disc degeneration. Other neck: No mass or lymph node enlargement. Upper chest: Partially visualize mosaic attenuation in the lung apices which could reflect small vessel or airway disease. Review of the MIP images confirms the above findings CTA HEAD FINDINGS Anterior circulation: The internal carotid arteries are patent from skullbase to carotid termini. There is mild siphon atherosclerosis bilaterally without stenosis. ACAs and MCAs are patent with mild atherosclerotic irregularity but no significant proximal stenosis or proximal branch occlusion. No aneurysm. Posterior circulation: The intracranial vertebral arteries are widely patent to the basilar. The right PICA and left AICA are dominant. Patent SCA origins are seen bilaterally. The basilar artery is widely patent. There is new occlusion of the right PCA at its origin over a length of 4 mm with reconstitution of the more distal P1 segment and beyond. The left PCA is patent without significant proximal stenosis, however there is a new moderate distal left P2 stenosis. Distal PCA branch vessel irregularity and attenuation are noted bilaterally. No aneurysm. Venous sinuses: Patent. Anatomic variants: None. Delayed phase: Not performed. Review of the MIP images confirms the above findings IMPRESSION: 1. New short segment occlusion of the right PCA at its origin. 2. New severe distal left P2 stenosis. 3. No large vessel occlusion or significant proximal stenosis in the anterior circulation. 4. Widely patent cervical carotid and vertebral arteries. 5.  Aortic Atherosclerosis (ICD10-I70.0). These results were called by telephone at the time of interpretation on 03/18/2017 at 4:22 pm to Dr. Rory Percy, who verbally acknowledged these results. Electronically Signed   By: Logan Bores M.D.   On: 03/18/2017 16:38   Ct Angio Neck W Or Wo  Contrast  Result Date: 03/18/2017 CLINICAL DATA:  Left-sided weakness and facial droop. EXAM: CT ANGIOGRAPHY HEAD AND NECK TECHNIQUE: Multidetector CT imaging of the head and neck was performed using the standard protocol during bolus administration of intravenous contrast. Multiplanar CT image reconstructions and MIPs were obtained to evaluate the vascular anatomy. Carotid stenosis measurements (when applicable) are obtained utilizing NASCET criteria, using the distal internal carotid diameter as the denominator. CONTRAST:  50 mL Isovue 370 COMPARISON:  Head CT today. Head MRI/ MRA 01/04/2014. Soft tissue neck CT 10/12/2007. FINDINGS: CTA NECK FINDINGS Aortic arch: Standard 3 vessel aortic arch with mild atherosclerotic plaque. Widely patent brachiocephalic and subclavian arteries. Tortuous subclavian arteries. Right carotid system: Widely patent without evidence of stenosis or dissection. Small amount of calcified plaque at the carotid bifurcation and in the distal cervical ICA. Tortuous proximal common carotid artery. Left carotid system:  Patent without evidence of stenosis or dissection. Tortuous common and internal carotid arteries. Partially retropharyngeal course of the ICA. Vertebral arteries: Patent without evidence of stenosis or dissection. Dominant left vertebral artery. Skeleton: Moderate cervical disc degeneration. Other neck: No mass or lymph node enlargement. Upper chest: Partially visualize mosaic attenuation in the lung apices which could reflect small vessel or airway disease. Review of the MIP images confirms the above findings CTA HEAD FINDINGS Anterior circulation: The internal carotid arteries are patent from skullbase to carotid termini. There is mild siphon atherosclerosis bilaterally without stenosis. ACAs and MCAs are patent with mild atherosclerotic irregularity but no significant proximal stenosis or proximal branch occlusion. No aneurysm. Posterior circulation: The intracranial  vertebral arteries are widely patent to the basilar. The right PICA and left AICA are dominant. Patent SCA origins are seen bilaterally. The basilar artery is widely patent. There is new occlusion of the right PCA at its origin over a length of 4 mm with reconstitution of the more distal P1 segment and beyond. The left PCA is patent without significant proximal stenosis, however there is a new moderate distal left P2 stenosis. Distal PCA branch vessel irregularity and attenuation are noted bilaterally. No aneurysm. Venous sinuses: Patent. Anatomic variants: None. Delayed phase: Not performed. Review of the MIP images confirms the above findings IMPRESSION: 1. New short segment occlusion of the right PCA at its origin. 2. New severe distal left P2 stenosis. 3. No large vessel occlusion or significant proximal stenosis in the anterior circulation. 4. Widely patent cervical carotid and vertebral arteries. 5.  Aortic Atherosclerosis (ICD10-I70.0). These results were called by telephone at the time of interpretation on 03/18/2017 at 4:22 pm to Dr. Rory Percy, who verbally acknowledged these results. Electronically Signed   By: Logan Bores M.D.   On: 03/18/2017 16:38   Mr Brain Wo Contrast  Result Date: 03/18/2017 CLINICAL DATA:  84 y/o F; stroke for follow-up. Left-sided weakness and facial droop. EXAM: MRI HEAD WITHOUT CONTRAST TECHNIQUE: Multiplanar, multiecho pulse sequences of the brain and surrounding structures were obtained without intravenous contrast. COMPARISON:  03/18/2017 CT head and CT angiogram head. 12/25/2013 MRI head. FINDINGS: Brain: Focus of reduced diffusion spanning 12 mm centered in the right anteromedial midbrain and extending into the right cerebral peduncle (series 3, image 21 and 22). No associated hemorrhage or mass effect. Numerous nonspecific foci of T2 FLAIR hyperintense signal abnormality in subcortical and periventricular white matter are compatible with moderate chronic microvascular  ischemic changes for age. Moderate brain parenchymal volume loss. Small chronic cortical infarcts are present in bilateral parietal lobes and the left frontal lobe. Vascular: Negative. Skull and upper cervical spine: Normal marrow signal. Sinuses/Orbits: Underpneumatized frontal sinuses. Mild mucosal thickening of sphenoid and ethmoid sinuses. Trace effusions and mastoid tips. Left phthisis bulbi. Right intra-ocular lens replacement. Other: None. IMPRESSION: 1. 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 No associated hemorrhage or mass effect. 2. Moderate chronic microvascular ischemic changes and moderate parenchymal volume loss of the brain. Small chronic cortical infarcts are present in bilateral parietal and left frontal lobes. These results will be called to the ordering clinician or representative by the Radiologist Assistant, and communication documented in the PACS or zVision Dashboard. Electronically Signed   By: Kristine Garbe M.D.   On: 03/18/2017 22:14   Ct Head Code Stroke Wo Contrast`  Addendum Date: 03/18/2017   ADDENDUM REPORT: 03/18/2017 16:13 ADDENDUM: These results were called by telephone at the time of interpretation  on 03/18/2017 at 4:13 pm to Dr. Rory Percy, who verbally acknowledged these results. Electronically Signed   By: Franchot Gallo M.D.   On: 03/18/2017 16:13   Result Date: 03/18/2017 CLINICAL DATA:  Code stroke.  Left-sided weakness, facial droop EXAM: CT HEAD WITHOUT CONTRAST TECHNIQUE: Contiguous axial images were obtained from the base of the skull through the vertex without intravenous contrast. COMPARISON:  MRI and CT 01/04/2014 FINDINGS: Brain: Mild atrophy. Negative for hydrocephalus. Patchy white matter hyperintensity bilaterally appears chronic and unchanged. Negative for hemorrhage, acute infarct, or mass lesion. Vascular: Negative for hyperdense vessel Skull: Negative Sinuses/Orbits:  Negative Other: None ASPECTS ([place] Stroke Program Early CT Score) - Ganglionic level infarction (caudate, lentiform nuclei, internal capsule, insula, M1-M3 cortex): 7 - Supraganglionic infarction (M4-M6 cortex): 3 Total score (0-10 with 10 being normal): 10 IMPRESSION: 1. No acute abnormality. Atrophy and chronic microvascular ischemia in the white matter 2. ASPECTS is 10 Electronically Signed: By: Franchot Gallo M.D. On: 03/18/2017 15:56    Microbiology: No results found for this or any previous visit (from the past 240 hour(s)).   Labs: Basic Metabolic Panel: Recent Labs  Lab 03/18/17 1545 03/18/17 1550 03/19/17 0456 03/20/17 0403 03/21/17 0409 03/23/17 0953  NA 138 137 132* 129* 133* 137  K 3.7 4.0 3.3* 4.5 3.8 3.7  CL 101 99* 98* 93* 100* 100*  CO2 24  --  25 26 25 29   GLUCOSE 165* 166* 118* 114* 95 91  BUN 16 23* 9 10 17 15   CREATININE 0.74 0.80 0.49 0.53 0.79 0.66  CALCIUM 9.2  --  8.7* 8.9 8.8* 9.0   Liver Function Tests: Recent Labs  Lab 03/18/17 1545  AST 33  ALT 24  ALKPHOS 105  BILITOT 0.5  PROT 7.1  ALBUMIN 4.0   No results for input(s): LIPASE, AMYLASE in the last 168 hours. No results for input(s): AMMONIA in the last 168 hours. CBC: Recent Labs  Lab 03/18/17 1545 03/18/17 1550 03/19/17 0456  WBC 5.0  --  5.5  NEUTROABS 2.5  --  3.6  HGB 15.2* 16.3* 14.3  HCT 47.1* 48.0* 43.2  MCV 98.5  --  95.8  PLT 180  --  170   Cardiac Enzymes: No results for input(s): CKTOTAL, CKMB, CKMBINDEX, TROPONINI in the last 168 hours. BNP: BNP (last 3 results) No results for input(s): BNP in the last 8760 hours.  ProBNP (last 3 results) No results for input(s): PROBNP in the last 8760 hours.  CBG: No results for input(s): GLUCAP in the last 168 hours.     Signed:  Cristal Ford  [facility] 03/24/2017, 1:30 PM

## 2017-03-25 ENCOUNTER — Inpatient Hospital Stay (HOSPITAL_COMMUNITY): Payer: PPO

## 2017-03-25 ENCOUNTER — Inpatient Hospital Stay (HOSPITAL_COMMUNITY): Payer: PPO | Admitting: Occupational Therapy

## 2017-03-25 ENCOUNTER — Inpatient Hospital Stay (HOSPITAL_COMMUNITY): Payer: PPO | Admitting: Speech Pathology

## 2017-03-25 DIAGNOSIS — K219 Gastro-esophageal reflux disease without esophagitis: Secondary | ICD-10-CM

## 2017-03-25 DIAGNOSIS — E871 Hypo-osmolality and hyponatremia: Secondary | ICD-10-CM

## 2017-03-25 DIAGNOSIS — I6389 Other cerebral infarction: Secondary | ICD-10-CM

## 2017-03-25 LAB — CBC WITH DIFFERENTIAL/PLATELET
Basophils Absolute: 0 10*3/uL (ref 0.0–0.1)
Basophils Relative: 1 %
Eosinophils Absolute: 0.1 10*3/uL (ref 0.0–0.7)
Eosinophils Relative: 2 %
HEMATOCRIT: 42.2 % (ref 36.0–46.0)
HEMOGLOBIN: 13.6 g/dL (ref 12.0–15.0)
LYMPHS ABS: 1.8 10*3/uL (ref 0.7–4.0)
LYMPHS PCT: 43 %
MCH: 31.8 pg (ref 26.0–34.0)
MCHC: 32.2 g/dL (ref 30.0–36.0)
MCV: 98.6 fL (ref 78.0–100.0)
MONOS PCT: 11 %
Monocytes Absolute: 0.5 10*3/uL (ref 0.1–1.0)
NEUTROS PCT: 43 %
Neutro Abs: 1.8 10*3/uL (ref 1.7–7.7)
Platelets: 170 10*3/uL (ref 150–400)
RBC: 4.28 MIL/uL (ref 3.87–5.11)
RDW: 13.5 % (ref 11.5–15.5)
WBC: 4.2 10*3/uL (ref 4.0–10.5)

## 2017-03-25 LAB — COMPREHENSIVE METABOLIC PANEL
ALK PHOS: 98 U/L (ref 38–126)
ALT: 38 U/L (ref 14–54)
ANION GAP: 5 (ref 5–15)
AST: 38 U/L (ref 15–41)
Albumin: 3 g/dL — ABNORMAL LOW (ref 3.5–5.0)
BILIRUBIN TOTAL: 0.5 mg/dL (ref 0.3–1.2)
BUN: 13 mg/dL (ref 6–20)
CALCIUM: 8.8 mg/dL — AB (ref 8.9–10.3)
CO2: 30 mmol/L (ref 22–32)
CREATININE: 0.61 mg/dL (ref 0.44–1.00)
Chloride: 103 mmol/L (ref 101–111)
Glucose, Bld: 96 mg/dL (ref 65–99)
Potassium: 4.3 mmol/L (ref 3.5–5.1)
SODIUM: 138 mmol/L (ref 135–145)
TOTAL PROTEIN: 5.6 g/dL — AB (ref 6.5–8.1)

## 2017-03-25 NOTE — Evaluation (Signed)
Occupational Therapy Assessment and Plan  Patient Details  Name: [name] [name] MRN: [id number] Date of Birth: 07/23/1924  OT Diagnosis: abnormal posture, cognitive deficits, muscle weakness (generalized) and coordination disorder Rehab Potential: Rehab Potential (ACUTE ONLY): Good ELOS: 7-10 days   Today's Date: 03/25/2017 OT Individual Time: 6195-0932 OT Individual Time Calculation (min): 73 min     Problem List:  Patient Active Problem List   Diagnosis Date Noted  . Gastroesophageal reflux disease   . Brainstem infarct, acute 03/24/2017  . Stroke-like symptoms   . Benign essential HTN   . History of supraventricular tachycardia   . Hypokalemia   . Hyponatremia   . Stroke ([place]) 03/18/2017  . Other forms of angina pectoris ([place]) 08/07/2015  . Pre-diabetes   . Blind left eye- (Fuch's disease) 03/05/2015  . Diverticulitis 12/19/2014  . UTI (lower urinary tract infection) 12/19/2014  . Essential hypertension 12/19/2014  . CAD (coronary artery disease) 12/19/2014  . Diverticulitis of large intestine without perforation or abscess without bleeding   . History of Rt brain stroke Oct 2015 06/14/2014  . Headache 06/14/2014  . Malignant hypertension 01/20/2014  . Hypertensive urgency, malignant 01/20/2014  . NSTEMI Nov 2015- conservative Rx 01/20/2014  . Chest pain with moderate risk of acute coronary syndrome 01/19/2014  . Aphasia 01/04/2014  . Paresthesia 01/04/2014  . Acute CVA (cerebrovascular accident) ([place]) 01/04/2014  . LLQ abdominal pain 01/01/2014  . Generalized abdominal pain 01/01/2014  . History of diverticulitis 01/01/2014  . Diverticulitis of colon without hemorrhage 12/07/2013  . Diaphoresis 12/29/2012  . Anemia 12/28/2012  . Sepsis ([place]) 12/24/2012  . Cellulitis of leg, right 12/24/2012  . Pyuria 12/24/2012  . Hypoxemia 12/24/2012  . Acute on chronic diastolic heart failure ([place]) 12/24/2012  . Postoperative anemia due to acute blood loss 12/15/2012  . Sleep  disturbance 10/03/2012  . Abdominal pulsatile mass 08/24/2012  . Depression 07/12/2012  . OA (osteoarthritis) of knee 06/27/2012  . History of PSVT 12/15/2011    Past Medical History:  Past Medical History:  Diagnosis Date  . Arthritis    "thumbs, joints" (03/23/2017  . Basal cell carcinoma    "several; scattered over my face, hands, leg some cut off; some burned off"  . Blind left eye    a. Corneal transplant x3 with rejection  . CAD (coronary artery disease)    a. s/p NSTEMI 2015 treated conservatively; nuclear stress test low risk. b. Continued angina despite med rx 07/2015 - s/p io [place] Stent to ramus intermedius with mod LAD stenosis neg by FFR.  . Complication of anesthesia    "very sensitive to RX"  . Diverticulitis large intestine   . Diverticulosis   . Family history of adverse reaction to anesthesia    "we all get PONV" (1/1/201)  . Gastritis   . GERD (gastroesophageal reflux disease)   . Hayfever   . Helicobacter pylori (H. pylori) 05/22/02   RUT-Positive  . Hypertension   . Myocardial infarction ([place]) 2015  . Squamous carcinoma    "several; scattered over my face, hands, leg some cut off; some burned off"  . Stroke [facility]) 2015   denies residual on 08/07/2015  . Stroke [facility]) 03/18/2017   "has effected her vision, balance, some memory" (03/23/2017)  . SVT (supraventricular tachycardia) ([facility])    a. seen by Dr. Caryl Comes - has loop recorder in. Patient has declined amiodarone due to side effect profile  . Varicose veins    Past Surgical History:  Past Surgical History:  Procedure Laterality Date  . BASAL CELL CARCINOMA EXCISION     "several; scattered over my face, hands, leg some cut off; some burned off"  . CARDIAC CATHETERIZATION N/A 08/07/2015   Procedure: Left Heart Cath and Coronary Angiography;  Surgeon: Michael Cooper, MD;  Location: [facility] INVASIVE CV LAB;  Service: Cardiovascular;  Laterality: N/A;  . CARDIAC CATHETERIZATION N/A 08/07/2015   Procedure: Coronary  Stent Intervention;  Surgeon: Michael Cooper, MD;  Location: [facility] INVASIVE CV LAB;  Service: Cardiovascular;  Laterality: N/A;  . CARDIAC CATHETERIZATION N/A 08/07/2015   Procedure: Intravascular Pressure Wire/FFR Study;  Surgeon: Michael Cooper, MD;  Location: [facility] INVASIVE CV LAB;  Service: Cardiovascular;  Laterality: N/A;  . CATARACT EXTRACTION W/ INTRAOCULAR LENS  IMPLANT, BILATERAL Bilateral 2005  . CLOSED REDUCTION SHOULDER DISLOCATION Left 2016 X 2  . CORNEAL TRANSPLANT Bilateral right 2009, left 2009    3 in left eye (last 2 failed), 1 in right eye  . DILATION AND CURETTAGE OF UTERUS    . EYE SURGERY    . JOINT REPLACEMENT    . LAPAROSCOPIC CHOLECYSTECTOMY  1993   "had gangrene in it"  . LOOP RECORDER IMPLANT N/A 01/05/2014   Procedure: LOOP RECORDER IMPLANT;  Surgeon: James D Allred, MD;  Location: [facility] CATH LAB;  Service: Cardiovascular;  Laterality: N/A;  . SQUAMOUS CELL CARCINOMA EXCISION     "several; scattered over my face, hands, leg some cut off; some burned off"  . TONSILLECTOMY    . TOTAL ABDOMINAL HYSTERECTOMY  11/1983   "ovaries and all"  . TOTAL KNEE ARTHROPLASTY Left 06/27/2012   Procedure: LEFT TOTAL KNEE ARTHROPLASTY;  Surgeon: Frank V Aluisio, MD;  Location: [facility] ORS;  Service: Orthopedics;  Laterality: Left;  . TOTAL KNEE ARTHROPLASTY Right 12/12/2012   Procedure: RIGHT TOTAL KNEE ARTHROPLASTY;  Surgeon: Frank V Aluisio, MD;  Location: [facility] ORS;  Service: Orthopedics;  Laterality: Right;  . TUBAL LIGATION  1966    Assessment & Plan Clinical Impression: Patient is a 84 y.o. year old female with history of HTN, SVT, CAD, Fuch's disease with left eye blindness and decreased vision right eye, blance deficits; who was admitted on 03/18/17 with sudden loss of vision in right eye, left facial numbness and elevated BP.History taken from chart review and family.CTA head/neck showed new short segment occlusion of R-PCA at origin with new severe distal left P2 stenosis. Vision improved  to baseline and tPA not given. MRI brainreviewed, showing right brainstem infarct. Per report, acute/early subacute infarct in right anteromedial midbrain with involvement of right cerebral peduncle and moderate chronic small vessel disease with moderate parenchymal brain volume loss.  Dr. Sethi recommended DAPT for stroke due to SVD v/s HTN emergency. Loop recorder interrogated-- no episodic a fib but has had frequent intermittent SVTs.  Patient with resultant  balance deficits, left inattention, cognitive deficits with deficits in problem solving functional tasks as well as poor safety awareness .  Patient transferred to CIR on 03/24/2017 .    Patient currently requires min with basic self-care skills and IADL secondary to muscle weakness, decreased cardiorespiratoy endurance, decreased coordination, field cut, decreased attention, decreased problem solving, decreased safety awareness and decreased memory and decreased sitting balance, decreased standing balance and decreased balance strategies.  Prior to hospitalization, patient could complete ADLs and IADLs with modified independent .  Patient will benefit from skilled intervention to increase independence with basic self-care skills prior to discharge home with care partner.  Anticipate patient will require 24 hour supervision and   follow up home health.  OT - End of Session Activity Tolerance: Decreased this session Endurance Deficit: Yes Endurance Deficit Description: multiple rest breaks secondary to fatigue with functional tasks OT Assessment Rehab Potential (ACUTE ONLY): Good OT Barriers to Discharge: Other (comments) OT Barriers to Discharge Comments: none known at this time OT Patient demonstrates impairments in the following area(s): Balance;Safety;Vision;Endurance;Motor;Pain;Cognition OT Basic ADL's Functional Problem(s): Grooming;Bathing;Dressing;Toileting OT Advanced ADL's Functional Problem(s): Simple Meal Preparation;Laundry OT  Transfers Functional Problem(s): Toilet;Tub/Shower OT Additional Impairment(s): None OT Plan OT Intensity: Minimum of 1-2 x/day, 45 to 90 minutes OT Frequency: 5 out of 7 days OT Duration/Estimated Length of Stay: 7-10 days OT Treatment/Interventions: Balance/vestibular training;Discharge planning;Pain management;Self Care/advanced ADL retraining;Therapeutic Activities;UE/LE Coordination activities;Cognitive remediation/compensation;Functional mobility training;Patient/family education;Therapeutic Exercise;Visual/perceptual remediation/compensation;Community reintegration;DME/adaptive equipment instruction;Psychosocial support;UE/LE Strength taining/ROM OT Self Feeding Anticipated Outcome(s): n/a OT Basic Self-Care Anticipated Outcome(s): supervision OT Toileting Anticipated Outcome(s): supervision OT Bathroom Transfers Anticipated Outcome(s): supervision OT Recommendation Recommendations for Other Services: Therapeutic Recreation consult Therapeutic Recreation Interventions: Pet therapy Patient destination: Home Follow Up Recommendations: Home health OT;24 hour supervision/assistance Equipment Recommended: To be determined   Skilled Therapeutic Intervention Upon entering the room, pt supine in bed with daughter present. Pt very motivated and requesting to shower this session. Pt ambulating with min HHA to bathroom for transfer onto TTB with steady assistance. Pt bathing from seated position with sit <>Stands to wash buttocks and peri area with steady assistance for balance. Pt ambulating to return to sit on EOB to don clothing items. Pt requires min cues with ambulating for safety secondary to visual loss. OT educated pt and caregiver on OT purpose, POC, goals, and need for 24/7 S at discharge for safety. Pt and her daughter verbalized understanding and agreement. Caregiver demonstrating safe handling techniques with patient and therefore checked off to assist pt with HHA to bathroom for  toileting needs. Pt seated in recliner chair at end of session with call bell and all needed items within reach.   OT Evaluation Precautions/Restrictions  Precautions Precautions: Fall Precaution Comments: L eye blindness, and decreased vision R eye Restrictions Weight Bearing Restrictions: No  Pain Pain Assessment Pain Assessment: No/denies pain Home Living/Prior Functioning Home Living Available Help at Discharge: Family, Available 24 hours/day Type of Home: House Home Access: Stairs to enter [employer] of Steps: 3 in front, 1 in back Home Layout: Two level, Able to live on main level with bedroom/bathroom Bathroom Shower/Tub: Gaffer, Door [employer]: Standard Additional Comments: split level home.  Lives With: Alone Prior Function Level of Independence: Independent with basic ADLs, Independent with homemaking with ambulation, Independent with gait, Independent with transfers Driving: No Vocation: Retired Leisure: Hobbies-yes (Comment) Comments: likes to work in garden Vision Baseline Vision/History: Wears glasses Wears Glasses: Reading only Patient Visual Report: Blurring of vision(R eye) Vision Assessment?: Yes Eye Alignment: Within Functional Limits Visual Fields: Impaired-to be further tested in functional context;Other (comment) Additional Comments: L visual field cut?? Pt able to scan to L and locate items with cuing Cognition Overall Cognitive Status: Impaired/Different from baseline Arousal/Alertness: Awake/alert Orientation Level: Person;Place;Situation Person: Oriented Place: Oriented Situation: Oriented Year: 2019 Month: January Day of Week: Correct Memory: Impaired Memory Impairment: Retrieval deficit;Decreased short term memory Decreased Short Term Memory: Verbal basic;Functional basic Immediate Memory Recall: Sock;Blue;Bed Memory Recall: Blue;Bed Memory Recall Blue: Without Cue Memory Recall Bed: Without Cue Attention:  Sustained Sustained Attention: Appears intact Awareness: Appears intact Problem Solving: Impaired Problem Solving Impairment: Verbal basic;Functional basic Safety/Judgment: Appears intact Sensation Sensation Light Touch:  Appears Intact Hot/Cold: Appears Intact Proprioception: Appears Intact Coordination Gross Motor Movements are Fluid and Coordinated: No Fine Motor Movements are Fluid and Coordinated: No Heel Shin Test: decreased coordination with heel to shin L on R as compared to R on L, intact to toe tapping Motor  Motor Motor: Within Functional Limits Motor - Skilled Clinical Observations: general weakness Mobility  Bed Mobility Bed Mobility: Supine to Sit Supine to Sit: 4: Min guard Supine to Sit Details: Verbal cues for safe use of DME/AE Transfers Sit to Stand: 4: Min guard Sit to Stand Details: Verbal cues for safe use of DME/AE;Verbal cues for precautions/safety  Trunk/Postural Assessment  Cervical Assessment Cervical Assessment: Within Functional Limits Thoracic Assessment Thoracic Assessment: Exceptions to WFL(kyphotic) Lumbar Assessment Lumbar Assessment: Within Functional Limits Postural Control Postural Control: Within Functional Limits  Balance Balance Balance Assessed: Yes Standardized Balance Assessment Standardized Balance Assessment: Berg Balance Test Berg Balance Test Sit to Stand: Able to stand  independently using hands Standing Unsupported: Able to stand 2 minutes with supervision Sitting with Back Unsupported but Feet Supported on Floor or Stool: Able to sit safely and securely 2 minutes Stand to Sit: Controls descent by using hands Transfers: Able to transfer with verbal cueing and /or supervision Standing Unsupported with Eyes Closed: Able to stand 10 seconds with supervision Standing Ubsupported with Feet Together: Needs help to attain position but able to stand for 30 seconds with feet together From Standing, Reach Forward with Outstretched  Arm: Can reach forward >12 cm safely (5") From Standing Position, Pick up Object from Floor: Able to pick up shoe, needs supervision From Standing Position, Turn to Look Behind Over each Shoulder: Looks behind one side only/other side shows less weight shift Turn 360 Degrees: Needs close supervision or verbal cueing Standing Unsupported, Alternately Place Feet on Step/Stool: Able to complete >2 steps/needs minimal assist Standing Unsupported, One Foot in Front: Able to take small step independently and hold 30 seconds Standing on One Leg: Unable to try or needs assist to prevent fall Total Score: 32 Dynamic Sitting Balance Dynamic Sitting - Balance Support: No upper extremity supported;Feet supported Dynamic Sitting - Level of Assistance: 5: Stand by assistance Sitting balance - Comments: sitting reaching to close car door Static Standing Balance Static Standing - Balance Support: No upper extremity supported;During functional activity Static Standing - Level of Assistance: 5: Stand by assistance Dynamic Standing Balance Dynamic Standing - Balance Support: No upper extremity supported Dynamic Standing - Level of Assistance: 4: Min assist Extremity/Trunk Assessment RUE Assessment RUE Assessment: Within Functional Limits LUE Assessment LUE Assessment: Within Functional Limits   See Function Navigator for Current Functional Status.   Refer to Care Plan for Long Term Goals  Recommendations for other services: Therapeutic Recreation  Pet therapy   Discharge Criteria: Patient will be discharged from OT if patient refuses treatment 3 consecutive times without medical reason, if treatment goals not met, if there is a change in medical status, if patient makes no progress towards goals or if patient is discharged from hospital.  The above assessment, treatment plan, treatment alternatives and goals were discussed and mutually agreed upon: by patient and by family  ,   P 03/25/2017, 12:34 PM  

## 2017-03-25 NOTE — H&P (Addendum)
Physical Medicine and Rehabilitation Admission H&P    CC: Stroke with functional deficits  HPI:  [name] [name] is a 84 y.o. female with history of HTN, SVT, CAD, Fuch's disease with left eye blindness and decreased vision right eye, blance deficits; who was admitted on 03/18/17 with sudden loss of vision in right eye, left facial numbness and elevated BP. History taken from chart review and family. CTA head/neck showed new short segment occlusion of R-PCA at origin with new severe distal left P2 stenosis. Vision improved to baseline and tPA not given. MRI brain reviewed, showing right brainstem infarct.  Per report, acute/early subacute infarct in right anteromedial midbrain with involvement of right cerebral peduncle and moderate chronic small vessel disease with moderate parenchymal brain volume loss.  Dr. Leonie Man recommended DAPT for stroke due to SVD v/s HTN emergency. Loop recorder interrogated-- no episodic a fib but has had frequent intermittent SVTs.  Patient with resultant  balance deficits, left inattention, cognitive deficits with deficits in problem solving functional tasks as well as poor safety awareness.   CIR recommended due to functional deficits.   Review of Systems  Constitutional: Negative for chills and fever.  HENT: Positive for hearing loss. Negative for tinnitus.   Eyes: Positive for blurred vision (blind in left eye and legally blind in right eye. Uses magnifying glass to read. ).  Respiratory: Negative for cough and shortness of breath.   Cardiovascular: Negative for chest pain and palpitations.  Gastrointestinal: Negative for constipation, heartburn and nausea.  Genitourinary: Negative for dysuria and urgency.  Musculoskeletal: Negative for back pain and myalgias.  Neurological: Positive for speech change and headaches (not as persistent). Negative for weakness.  Psychiatric/Behavioral: Negative for depression. The patient is not nervous/anxious.   All other systems  reviewed and are negative.     Past Medical History:  Diagnosis Date  . Arthritis    "thumbs, joints" (03/23/2017  . Basal cell carcinoma    "several; scattered over my face, hands, leg some cut off; some burned off"  . Blind left eye    a. Corneal transplant x3 with rejection  . CAD (coronary artery disease)    a. s/p NSTEMI 2015 treated conservatively; nuclear stress test low risk. b. Continued angina despite med rx 07/2015 - s/p io [place] Stent to ramus intermedius with mod LAD stenosis neg by FFR.  . Complication of anesthesia    "very sensitive to RX"  . Diverticulitis large intestine   . Diverticulosis   . Family history of adverse reaction to anesthesia    "we all get PONV" (1/1/201)  . Gastritis   . GERD (gastroesophageal reflux disease)   . Hayfever   . Helicobacter pylori (H. pylori) 05/22/02   RUT-Positive  . Hypertension   . Myocardial infarction ([place]) 2015  . Squamous carcinoma    "several; scattered over my face, hands, leg some cut off; some burned off"  . Stroke [facility]) 2015   denies residual on 08/07/2015  . Stroke [facility]) 03/18/2017   "has effected her vision, balance, some memory" (03/23/2017)  . SVT (supraventricular tachycardia) (HCC)    a. seen by Dr. Caryl Comes - has loop recorder in. Patient has declined amiodarone due to side effect profile  . Varicose veins     Past Surgical History:  Procedure Laterality Date  . BASAL CELL CARCINOMA EXCISION     "several; scattered over my face, hands, leg some cut off; some burned off"  . CARDIAC CATHETERIZATION N/A 08/07/2015   Procedure:  Left Heart Cath and Coronary Angiography;  Surgeon: [name], MD;  Location: [place] [facility];  Service: Cardiovascular;  Laterality: N/A;  . CARDIAC CATHETERIZATION N/A 08/07/2015   Procedure: Coronary Stent Intervention;  Surgeon: [name], MD;  Location: [place] [facility];  Service: Cardiovascular;  Laterality: N/A;  . CARDIAC CATHETERIZATION N/A 08/07/2015   Procedure:  Intravascular Pressure Wire/FFR Study;  Surgeon: [name], MD;  Location: [place] [facility];  Service: Cardiovascular;  Laterality: N/A;  . CATARACT EXTRACTION W/ INTRAOCULAR LENS  IMPLANT, BILATERAL Bilateral 2005  . CLOSED REDUCTION SHOULDER DISLOCATION Left 2016 X 2  . CORNEAL TRANSPLANT Bilateral right 2009, left 2009    3 in left eye (last 2 failed), 1 in right eye  . DILATION AND CURETTAGE OF UTERUS    . EYE SURGERY    . JOINT REPLACEMENT    . [place]   "had gangrene in it"  . LOOP RECORDER IMPLANT N/A 01/05/2014   Procedure: LOOP RECORDER IMPLANT;  Surgeon: Coralyn Mark, MD;  Location: [place] [facility]ATH [facility];  Service: Cardiovascular;  Laterality: N/A;  . SQUAMOUS CELL CARCINOMA EXCISION     "several; scattered over my face, hands, leg some cut off; some burned off"  . TONSILLECTOMY    . TOTAL ABDOMINAL HYSTERECTOMY  11/1983   "ovaries and all"  . TOTAL KNEE ARTHROPLASTY Left 06/27/2012   Procedure: LEFT TOTAL KNEE ARTHROPLASTY;  Surgeon: Gearlean Alf, MD;  Location: [facility];  Service: Orthopedics;  Laterality: Left;  . TOTAL KNEE ARTHROPLASTY Right 12/12/2012   Procedure: RIGHT TOTAL KNEE ARTHROPLASTY;  Surgeon: Gearlean Alf, MD;  Location: [facility];  Service: Orthopedics;  Laterality: Right;  . TUBAL LIGATION  1966    Family History  Problem Relation Age of Onset  . Stroke Mother   . Heart attack Father   . Heart attack Brother   . Cancer Brother   . Heart attack Brother   . Heart attack Brother   . Heart attack Brother   . Parkinsonism Brother     Social History:  Lives alone and independent PTA.  Still cooks? Daughter checks in multiple times during the day per son. She  reports that  has never smoked. she has never used smokeless tobacco. She reports that she does not drink alcohol or use drugs.    Allergies  Allergen Reactions  . Contrast Media [Iodinated Diagnostic Agents] Anaphylaxis  . Fluorescein Anaphylaxis, Shortness Of Breath,  Itching and Swelling    Tongue became swollen  . Other Anaphylaxis, Shortness Of Breath, Swelling and Other (See Comments)    Yellow eye dye that is used to check retina (Fluorescein) Caused swelling of tongue  . Sulfa Antibiotics Rash  . Ultram [Tramadol] Hives  . Buprenex [Buprenorphine Hcl] Palpitations and Other (See Comments)    Heart raced  . Buprenorphine Hcl Other (See Comments)    Impacted blood pressure (high or low??)  . Keflex [Cephalexin] Diarrhea  . Levofloxacin Other (See Comments)    Reaction not recalled  . Oxycodone-Acetaminophen Nausea And Vomiting  . Yellow Dye Itching, Swelling and Other (See Comments)    Fluorescein (in eye drops) - Tongue became swollen  . Augmentin [Amoxicillin-Pot Clavulanate] Nausea And Vomiting  . Hydralazine Hcl Swelling, Palpitations and Rash    Patient developed facial flushing, swelling in legs.  . Levofloxacin Other (See Comments)    Reaction not recalled  . Percocet [Oxycodone-Acetaminophen] Nausea And Vomiting and Other (See Comments)    "  Felt drained," also  . Tape Other (See Comments)    Adhesive tape pulls off the skin  . Zetia [Ezetimibe] Other (See Comments)    Muscle weakness    Medications Prior to Admission  Medication Sig Dispense Refill  . amLODipine (NORVASC) 2.5 MG tablet Take 1 tablet (2.5 mg total) by mouth daily as needed (BLOOD PRESSURE). Only take if greater than 140 (Patient taking differently: Take 2.5 mg by mouth daily as needed (for blood pressure/take only if Systolic reading is greater than 180). ) 90 tablet 3  . clopidogrel (PLAVIX) 75 MG tablet take 1 tablet by mouth once daily (Patient taking differently: Take 75 mg by mouth once a day) 90 tablet 3  . hyoscyamine (LEVSIN, ANASPAZ) 0.125 MG tablet Take 1 tablet (0.125 mg total) by mouth every 4 (four) hours as needed for cramping. 30 tablet 4  . Magnesium 250 MG TABS Take 250 mg by mouth at bedtime.    . Melatonin 1 MG SUBL Place 1 mg under the tongue at  bedtime.    . Multiple Vitamin (MULTI VITAMIN DAILY PO) Take 1 tablet by mouth every morning.    . Multiple Vitamins-Minerals (PRESERVISION AREDS 2) CAPS Take 1 capsule by mouth daily after breakfast.    . nebivolol (BYSTOLIC) 5 MG tablet Take one tablet by mouth daily.  If SBP is greater than 180 you can take an additional 4m daily as needed (Patient taking differently: Take 5-10 mg by mouth See admin instructions. 5 mg once a day at 6 PM and take an additional 5 mg once a day if needed if Systolic reading is greater than 180) 60 tablet 6  . nitroGLYCERIN (NITROSTAT) 0.4 MG SL tablet Place 1 tablet (0.4 mg total) under the tongue every 5 (five) minutes as needed for chest pain. 25 tablet 2  . Polyethyl Glycol-Propyl Glycol (SYSTANE ULTRA) 0.4-0.3 % SOLN Apply 1 drop to eye 2 (two) times daily as needed (for irritation).     .Vladimir FasterGlycol-Propyl Glycol (SYSTANE) 0.4-0.3 % GEL ophthalmic gel Place 1 application into both eyes at bedtime.    . prednisoLONE acetate (PRED FORTE) 1 % ophthalmic suspension Place 1 drop into both eyes See admin instructions. 1 drop into both eyes at bedtime spaced 5 minutes apart from Systane gel    . ranitidine (ZANTAC) 150 MG tablet Take 1 tablet (150 mg total) by mouth at bedtime. 90 tablet 3    Drug Regimen Review  Drug regimen was reviewed and remains appropriate with no significant issues identified  Home: Home Living Family/patient expects to be discharged to:: Private residence Living Arrangements: Alone Available Help at Discharge: Family, Available PRN/intermittently Type of Home: House Home Access: Stairs to enter [employer] Steps: 3 in front, 1 in back Home Layout: One level Bathroom Shower/Tub: TChiropodist Standard Home Equipment: None  Lives With: Alone   Functional History: Prior Function Level of Independence: Independent Comments: does not drive but independent with all ADL  Functional Status:    Mobility: Bed Mobility Overal bed mobility: Needs Assistance Bed Mobility: Supine to Sit, Sit to Supine Supine to sit: Min guard, HOB elevated Sit to supine: Mod assist General bed mobility comments: Min guard for safety and HOB elevated Transfers Overall transfer level: Needs assistance Equipment used: Rolling walker (2 wheeled) Transfers: Sit to/from Stand Sit to Stand: Min assist Stand pivot transfers: Max assist General transfer comment: minA for steadying in standing with RW Ambulation/Gait Ambulation/Gait assistance: Mod assist, Min  assist Ambulation Distance (Feet): 100 Feet Assistive device: Rolling walker (2 wheeled) Gait Pattern/deviations: Step-to pattern, Decreased step length - right, Decreased step length - left, Leaning posteriorly, Shuffle General Gait Details: modA for maintaining balance with gait, pt experienced 3-4x LoB which required modA to correct, vc for sequencing, proximity to walker, decreased gait speed and increased BoS.  Gait velocity: slowed Gait velocity interpretation: Below normal speed for age/gender    ADL: ADL Overall ADL's : Needs assistance/impaired Eating/Feeding: Minimal assistance, Sitting, Cueing for sequencing, Cueing for safety, Cueing for compensatory techinques Eating/Feeding Details (indicate cue type and reason): Pt requiring cues thorughout meal prep task to attend to L side of table  Grooming: Sitting, Min guard, Cueing for sequencing, Minimal assistance, Oral care, Brushing hair, Wash/dry face Grooming Details (indicate cue type and reason): Pt requiring cues throughout grooming to locate items on L side of sick. With cues, pt is able to locate items and cross midline. Upper Body Bathing: Moderate assistance, Sitting Lower Body Bathing: Maximal assistance, Sit to/from stand Upper Body Dressing : Moderate assistance, Sitting Lower Body Dressing: Minimal assistance, Sit to/from stand Lower Body Dressing Details (indicate cue type  and reason): Pt donning pants and shoes with Min A while sitting at EOB. Min A for balance and safety Toilet Transfer: Maximal assistance, Stand-pivot, BSC Toilet Transfer Details (indicate cue type and reason): Attempted with and without RW. Toileting- Clothing Manipulation and Hygiene: Minimal assistance, Sitting/lateral lean Toileting - Clothing Manipulation Details (indicate cue type and reason): for peri care, min assist for balance Functional mobility during ADLs: Minimal assistance, Rolling walker General ADL Comments: Pt demonstrating increased functional performance. However, contineus to be a fall risk due to visual deficits and balance. Eduated pt and daughter on compensatory tehcniques for low vision and field cut deficits (i.e. anchors to L, contrast colors, and lighting).  Cognition: Cognition Overall Cognitive Status: Impaired/Different from baseline Arousal/Alertness: Awake/alert Orientation Level: Oriented X4 Attention: Sustained Sustained Attention: Impaired Sustained Attention Impairment: Verbal basic, Functional basic Memory: Impaired Memory Impairment: Retrieval deficit, Decreased short term memory Decreased Short Term Memory: Verbal basic, Functional basic Awareness: Appears intact Problem Solving: Impaired Problem Solving Impairment: Verbal basic, Functional basic Executive Function: Sequencing, Organizing Sequencing: Impaired Sequencing Impairment: Verbal basic, Functional basic Organizing: Impaired Organizing Impairment: Verbal basic, Functional basic Safety/Judgment: Appears intact Cognition Arousal/Alertness: Lethargic, Awake/alert Behavior During Therapy: WFL for tasks assessed/performed Overall Cognitive Status: Impaired/Different from baseline Area of Impairment: Following commands, Safety/judgement, Problem solving Orientation Level: Disoriented to, Time, Place, Situation Memory: Decreased short-term memory Following Commands: Follows one step  commands with increased time Safety/Judgement: Decreased awareness of safety, Decreased awareness of deficits Problem Solving: Slow processing, Decreased initiation, Difficulty sequencing, Requires verbal cues, Requires tactile cues General Comments: Pt requiring increased time and cues for problem solving and attending to L side of visual field. Pt demonstrating slower processing than baseline    Blood pressure (!) 141/51, pulse 70, temperature 98.1 F (36.7 C), temperature source Oral, resp. rate 18, SpO2 96 %. Physical Exam  Nursing note and vitals reviewed. Constitutional: She is oriented to person, place, and time. She appears well-developed and well-nourished.  Up in bed. Alert and interactive.   HENT:  Head: Normocephalic and atraumatic.  Eyes: Right eye exhibits no discharge. Left eye exhibits no discharge. Right conjunctiva is not injected. Left eye exhibits abnormal extraocular motion. Left pupil is not reactive.  Neck: Normal range of motion. Neck supple.  Cardiovascular: Normal rate and regular rhythm.  Respiratory: Effort normal and breath  sounds normal. No respiratory distress. She has no wheezes.  GI: Soft. Bowel sounds are normal. She exhibits no distension. There is no tenderness.  Musculoskeletal: She exhibits no edema or tenderness.  Neurological: She is alert and oriented to person, place, and time.  Speech clear and not as tangential.  Left eye opaque with ptosis.   Left inattention but now can move right eye to midline. Able to follow simple motor commands but easily distracted.  Lacks insight into deficits.  ?No ataxia (limited by vision)  Skin: Skin is warm and dry. No erythema.  Psychiatric: She has a normal mood and affect. Her speech is tangential. She expresses impulsivity. She is inattentive.   Results for orders placed or performed during the hospital encounter of 03/18/17 (from the past 48 hour(s))  Basic metabolic panel     Status: Abnormal   Collection  Time: 03/23/17  9:53 AM  Result Value Ref Range   Sodium 137 135 - 145 mmol/L   Potassium 3.7 3.5 - 5.1 mmol/L   Chloride 100 (L) 101 - 111 mmol/L   CO2 29 22 - 32 mmol/L   Glucose, Bld 91 65 - 99 mg/dL   BUN 15 6 - 20 mg/dL   Creatinine, Ser 0.66 0.44 - 1.00 mg/dL   Calcium 9.0 8.9 - 10.3 mg/dL   GFR calc non Af Amer >60 >60 mL/min   GFR calc Af Amer >60 >60 mL/min    Comment: (NOTE) The eGFR has been calculated using the CKD EPI equation. This calculation has not been validated in all clinical situations. eGFR's persistently <60 mL/min signify possible Chronic Kidney Disease.    Anion gap 8 5 - 15   No results found.     Medical Problem List and Plan: 1.  Balance deficits, left inattention, cognitive deficits with deficits in problem solving functional tasks as well as poor safety awareness secondary to right anteromedial midbrain infarct.  2.  DVT Prophylaxis/Anticoagulation: Pharmaceutical: Lovenox 3. Chronic right hip pain/Headaches/Pain Management:  Tylenol prn. Will add local measures also.  4. Mood: LCSW to follow for evaluation and support.  5. Neuropsych: This patient is not fully capable of making decisions on her own behalf. 6. Skin/Wound Care: routine pressure relief measures 7. Fluids/Electrolytes/Nutrition: Monitor I/O. Check lytes in am.  8. HTN: Monitor blood pressure bid.  9. Hyponatremia: Improving. Will recheck in am.  10.  Dyslipidemia: on Lipitor.  11. H/o SVTs: Asymptomatic. To follow up with Dr. Burt Knack after discharge.  12. GERD: managed with pepcid.    Post Admission Physician Evaluation: 1. Preadmission assessment reviewed and changes made below. 2. Functional deficits secondary  to right anteromedial midbrain infarct. 3. Patient is admitted to receive collaborative, interdisciplinary care between the physiatrist, rehab nursing staff, and therapy team. 4. Patient's level of medical complexity and substantial therapy needs in context of that  medical necessity cannot be provided at a lesser intensity of care such as a SNF. 5. Patient has experienced substantial functional loss from his/her baseline which was documented above under the "Functional History" and "Functional Status" headings.  Judging by the patient's diagnosis, physical exam, and functional history, the patient has potential for functional progress which will result in measurable gains while on inpatient rehab.  These gains will be of substantial and practical use upon discharge  in facilitating mobility and self-care at the household level. 6. Physiatrist will provide 24 hour management of medical needs as well as oversight of the therapy plan/treatment and provide guidance as appropriate  regarding the interaction of the two. 7. 24 hour rehab nursing will assist with bladder management, safety, disease management, medication administration and patient education  and help integrate therapy concepts, techniques,education, etc. 8. PT will assess and treat for/with: Lower extremity strength, range of motion, stamina, balance, functional mobility, safety, adaptive techniques and equipment, coping skills, pain control, stroke education.   Goals are: Mod I/Supervision. 9. OT will assess and treat for/with: ADL's, functional mobility, safety, upper extremity strength, adaptive techniques and equipment, ego support, and community reintegration.   Goals are: Mod I/Supervision. Therapy may proceed with showering this patient. 10. SLP will assess and treat for/with: cognition, swallowing.  Goals are: Supervision. 11. Case Management and Social Worker will assess and treat for psychological issues and discharge planning. 12. Team conference will be held weekly to assess progress toward goals and to determine barriers to discharge. 13. Patient will receive at least 3 hours of therapy per day at least 5 days per week. 14. ELOS: 7-10 days.       15. Prognosis:  good  Delice Lesch, MD,  ABPMR Bary Leriche, [place] 03/24/2017

## 2017-03-25 NOTE — Progress Notes (Signed)
[place] PHYSICAL MEDICINE & REHABILITATION     PROGRESS NOTE    Subjective/Complaints: Slept well. "hungry" but doesn't like food consistency. Headaches are improving  ROS: pt denies nausea, vomiting, diarrhea, cough, shortness of breath or chest pain   Objective: Vital Signs: Blood pressure (!) 158/62, pulse 71, temperature 97.7 F (36.5 C), temperature source Oral, resp. rate 18, SpO2 97 %. No results found. Recent Labs    03/25/17 0633  WBC 4.2  HGB 13.6  HCT 42.2  PLT 170   Recent Labs    03/23/17 0953 03/25/17 0633  NA 137 138  K 3.7 4.3  CL 100* 103  GLUCOSE 91 96  BUN 15 13  CREATININE 0.66 0.61  CALCIUM 9.0 8.8*   CBG (last 3)  No results for input(s): GLUCAP in the last 72 hours.  Wt Readings from Last 3 Encounters:  10/01/16 77.6 kg (171 lb)  04/29/16 77 kg (169 lb 12.8 oz)  12/16/15 76.7 kg (169 lb)    Physical Exam:  Constitutional: She is oriented to person, place, and time. She appears well-developed and well-nourished.  Up in bed. Alert and interactive.   HENT:  Head: Normocephalic and atraumatic.  Eyes: Right eye exhibits no discharge. Left eye exhibits no discharge. Right conjunctiva is not injected. Left eye exhibits abnormal extraocular motion. Left pupil is not reactive.  Neck: Normal range of motion. Neck supple.  Cardiovascular: RRR  Respiratory: CTA Bilaterally without wheezes or rales. Normal effort .  GI: Soft. Bowel sounds are normal. She exhibits no distension. There is no tenderness.  Musculoskeletal: She exhibits no edema or tenderness.  Neurological: She is alert and oriented to person, place, and time.  Speech clear and not as tangential.  Left eye opaque with ptosis--no vision through eye Left inattention. ?left visual field cut, decreased visual acuity through right eye?Leodis [place] insight into deficits.  ?No ataxia (limited by vision)  Skin: Skin is warm and dry. No erythema.  Psychiatric: She has a normal mood and  affect. Her speech is tangential. She expresses impulsivity. She is inattentive.     Assessment/Plan: 1. Balance and functional deficits secondary to right midbrain infarct which require 3+ hours per day of interdisciplinary therapy in a comprehensive inpatient rehab setting. Physiatrist is providing close team supervision and 24 hour management of active medical problems listed below. Physiatrist and rehab team continue to assess barriers to discharge/monitor patient progress toward functional and medical goals.  Function:  Bathing Bathing position      Bathing parts      Bathing assist        Upper Body Dressing/Undressing Upper body dressing                    Upper body assist        Lower Body Dressing/Undressing Lower body dressing                                  Lower body assist        Toileting Toileting   Toileting steps completed by patient: Performs perineal hygiene Toileting steps completed by helper: Adjust clothing prior to toileting, Adjust clothing after toileting Toileting Assistive Devices: Grab bar or rail  Toileting assist Assist level: Touching or steadying assistance (Pt.75%)   Transfers Chair/bed transfer             Locomotion Ambulation  Wheelchair          Cognition Comprehension Comprehension assist level: Understands basic 90% of the time/cues < 10% of the time  Expression Expression assist level: Expresses basic 90% of the time/requires cueing < 10% of the time.  Social Interaction Social Interaction assist level: Interacts appropriately 90% of the time - Needs monitoring or encouragement for participation or interaction.  Problem Solving Problem solving assist level: Solves basic 90% of the time/requires cueing < 10% of the time  Memory Memory assist level: Recognizes or recalls 90% of the time/requires cueing < 10% of the time   Medical Problem List and Plan: 1.  Balance deficits, left  inattention, cognitive deficits with deficits in problem solving functional tasks as well as poor safety awareness secondary to right anteromedial midbrain infarct.  2.  DVT Prophylaxis/Anticoagulation: Pharmaceutical: Lovenox 3. Chronic right hip pain/Headaches/Pain Management:  Tylenol prn. Will add local measures also.  4. Mood: LCSW to follow for evaluation and support.  5. Neuropsych: This patient is not fully capable of making decisions on her own behalf. 6. Skin/Wound Care: routine pressure relief measures 7. Fluids/Electrolytes/Nutrition: Monitor I/O.  Good appetite.      -all other labs reviewed 8. HTN: Monitor blood pressure bid.  9. Hyponatremia: Improving. Sodium recovering 10.  Dyslipidemia: on Lipitor.  11. H/o SVTs: Asymptomatic. To follow up with Dr. Burt Knack after discharge.  12. GERD: managed with pepcid.    LOS (Days) 1 A [place] T, MD 03/25/2017 8:33 AM

## 2017-03-25 NOTE — Plan of Care (Signed)
LTG's established [employer], PT 03/25/2017

## 2017-03-25 NOTE — Evaluation (Signed)
Speech Language Pathology Assessment and Plan  Patient Details  Name: [name] [name] MRN: [id number] Date of Birth: 10-04-1924  SLP Diagnosis: Dysphagia;Cognitive Impairments  Rehab Potential: Excellent ELOS: 7-10 days    Today's Date: 03/25/2017 SLP Individual Time: 9798-9211 SLP Individual Time Calculation (min): 60 min   Problem List:  Patient Active Problem List   Diagnosis Date Noted  . Gastroesophageal reflux disease   . Brainstem infarct, acute 03/24/2017  . Stroke-like symptoms   . Benign essential HTN   . History of supraventricular tachycardia   . Hypokalemia   . Hyponatremia   . Stroke ([place]) 03/18/2017  . Other forms of angina pectoris ([place]) 08/07/2015  . Pre-diabetes   . Blind left eye- (Fuch's disease) 03/05/2015  . Diverticulitis 12/19/2014  . UTI (lower urinary tract infection) 12/19/2014  . Essential hypertension 12/19/2014  . CAD (coronary artery disease) 12/19/2014  . Diverticulitis of large intestine without perforation or abscess without bleeding   . History of Rt brain stroke Oct 2015 06/14/2014  . Headache 06/14/2014  . Malignant hypertension 01/20/2014  . Hypertensive urgency, malignant 01/20/2014  . NSTEMI Nov 2015- conservative Rx 01/20/2014  . Chest pain with moderate risk of acute coronary syndrome 01/19/2014  . Aphasia 01/04/2014  . Paresthesia 01/04/2014  . Acute CVA (cerebrovascular accident) ([place]) 01/04/2014  . LLQ abdominal pain 01/01/2014  . Generalized abdominal pain 01/01/2014  . History of diverticulitis 01/01/2014  . Diverticulitis of colon without hemorrhage 12/07/2013  . Diaphoresis 12/29/2012  . Anemia 12/28/2012  . Sepsis ([place]) 12/24/2012  . Cellulitis of leg, right 12/24/2012  . Pyuria 12/24/2012  . Hypoxemia 12/24/2012  . Acute on chronic diastolic heart failure ([place]) 12/24/2012  . Postoperative anemia due to acute blood loss 12/15/2012  . Sleep disturbance 10/03/2012  . Abdominal pulsatile mass 08/24/2012  . Depression  07/12/2012  . OA (osteoarthritis) of knee 06/27/2012  . History of PSVT 12/15/2011   Past Medical History:  Past Medical History:  Diagnosis Date  . Arthritis    "thumbs, joints" (03/23/2017  . Basal cell carcinoma    "several; scattered over my face, hands, leg some cut off; some burned off"  . Blind left eye    a. Corneal transplant x3 with rejection  . CAD (coronary artery disease)    a. s/p NSTEMI 2015 treated conservatively; nuclear stress test low risk. b. Continued angina despite med rx 07/2015 - s/p io [place] Stent to ramus intermedius with mod LAD stenosis neg by FFR.  . Complication of anesthesia    "very sensitive to RX"  . Diverticulitis large intestine   . Diverticulosis   . Family history of adverse reaction to anesthesia    "we all get PONV" (1/1/201)  . Gastritis   . GERD (gastroesophageal reflux disease)   . Hayfever   . Helicobacter pylori (H. pylori) 05/22/02   RUT-Positive  . Hypertension   . Myocardial infarction ([place]) 2015  . Squamous carcinoma    "several; scattered over my face, hands, leg some cut off; some burned off"  . Stroke [facility]) 2015   denies residual on 08/07/2015  . Stroke [facility]) 03/18/2017   "has effected her vision, balance, some memory" (03/23/2017)  . SVT (supraventricular tachycardia) ([facility])    a. seen by Dr. Caryl Comes - has loop recorder in. Patient has declined amiodarone due to side effect profile  . Varicose veins    Past Surgical History:  Past Surgical History:  Procedure Laterality Date  . BASAL CELL CARCINOMA EXCISION     "  several; scattered over my face, hands, leg some cut off; some burned off"  . CARDIAC CATHETERIZATION N/A 08/07/2015   Procedure: Left Heart Cath and Coronary Angiography;  Surgeon: [name], MD;  Location: [place] CV LAB;  Service: Cardiovascular;  Laterality: N/A;  . CARDIAC CATHETERIZATION N/A 08/07/2015   Procedure: Coronary Stent Intervention;  Surgeon: [name], MD;  Location: [place] CV LAB;   Service: Cardiovascular;  Laterality: N/A;  . CARDIAC CATHETERIZATION N/A 08/07/2015   Procedure: Intravascular Pressure Wire/FFR Study;  Surgeon: [name], MD;  Location: [place] CV LAB;  Service: Cardiovascular;  Laterality: N/A;  . CATARACT EXTRACTION W/ INTRAOCULAR LENS  IMPLANT, BILATERAL Bilateral 2005  . CLOSED REDUCTION SHOULDER DISLOCATION Left 2016 X 2  . CORNEAL TRANSPLANT Bilateral right 2009, left 2009    3 in left eye (last 2 failed), 1 in right eye  . DILATION AND CURETTAGE OF UTERUS    . EYE SURGERY    . JOINT REPLACEMENT    . [place]   "had gangrene in it"  . LOOP RECORDER IMPLANT N/A 01/05/2014   Procedure: LOOP RECORDER IMPLANT;  Surgeon: Coralyn Mark, MD;  Location: [place] CATH LAB;  Service: Cardiovascular;  Laterality: N/A;  . SQUAMOUS CELL CARCINOMA EXCISION     "several; scattered over my face, hands, leg some cut off; some burned off"  . TONSILLECTOMY    . TOTAL ABDOMINAL HYSTERECTOMY  11/1983   "ovaries and all"  . TOTAL KNEE ARTHROPLASTY Left 06/27/2012   Procedure: LEFT TOTAL KNEE ARTHROPLASTY;  Surgeon: Gearlean Alf, MD;  Location: [facility];  Service: Orthopedics;  Laterality: Left;  . TOTAL KNEE ARTHROPLASTY Right 12/12/2012   Procedure: RIGHT TOTAL KNEE ARTHROPLASTY;  Surgeon: Gearlean Alf, MD;  Location: [facility];  Service: Orthopedics;  Laterality: Right;  . TUBAL LIGATION  1966    Assessment / Plan / Recommendation Clinical Impression Patient is a 84 y.o.femalewith history of HTN, SVT, CAD, Fuch's disease with left eye blindness and decreased vision right eye, blance deficits; who was admitted on 03/18/17 with sudden loss of vision in right eye, left facial numbness and elevated BP.History taken from chart review and family.CTA head/neck showed new short segment occlusion of R-PCA at origin with new severe distal left P2 stenosis. Vision improved to baseline and tPA not given. MRI brainreviewed, showing right brainstem  infarct. Per report, acute/early subacute infarct in right anteromedial midbrain with involvement of right cerebral peduncle and moderate chronic small vessel disease with moderate parenchymal brain volume loss.  Dr. Leonie Man recommended DAPT for stroke due to SVD v/s HTN emergency. Loop recorder interrogated-- no episodic a fib but has had frequent intermittent SVTs.  Patient with resultant  balance deficits, left inattention, cognitive deficits with deficits in problem solving functional tasks as well as poor safety awareness. CIR recommended due to functional deficits and patient admitted 03/24/17.  Patient demonstrates mild cognitive impairments impacting attention, attention to left field of environment, problem solving and recall which impacts her ability to complete functional and familiar tasks safely. Patient also consumed Dys. 3 and regular textures and demonstrated efficient mastication but intermittent overt s/s of aspiration, suspect due to dryness of food.  Patient also consumed thin liquids via cup with subtle and intermittent delayed throat clearing. However, patient reports baseline allergies. Recommend patient continue current diet with close monitoring for tolerance and possible need for objective assessment. Patient would benefit from skilled SLP intervention to maximize her cognitive and swallowing function in  order to maximize her overall functional independence prior to discharge.     Skilled Therapeutic Interventions          Administered a BSE and cognitive-linguistic evaluation. Please see above for details.   SLP Assessment  Patient will need skilled Speech Lanaguage Pathology Services during CIR admission    Recommendations  SLP Diet Recommendations: Dysphagia 3 (Mech soft);Thin Liquid Administration via: Cup;No straw Medication Administration: Whole meds with liquid Supervision: Patient able to self feed Oral Care Recommendations: Oral care BID Patient destination:  Home Follow up Recommendations: 24 hour supervision/assistance;Outpatient SLP Equipment Recommended: None recommended by SLP    SLP Frequency 3 to 5 out of 7 days   SLP Duration  SLP Intensity  SLP Treatment/Interventions 7-10 days  Minumum of 1-2 x/day, 30 to 90 minutes  Cognitive remediation/compensation;Environmental controls;Internal/external aids;Cueing hierarchy;Dysphagia/aspiration precaution training;Functional tasks;Patient/family education;Therapeutic Activities    Pain Pain Assessment Pain Assessment: No/denies pain  Prior Functioning Type of Home: House  Lives With: Alone Available Help at Discharge: Family;Available 24 hours/day Vocation: Retired  Function:  Eating Eating   Modified Consistency Diet: Yes Eating Assist Level: Set up assist for;More than reasonable amount of time;Supervision or verbal cues   Eating Set Up Assist For: Opening containers       Cognition Comprehension Comprehension assist level: Understands basic 90% of the time/cues < 10% of the time  Expression   Expression assist level: Expresses basic 90% of the time/requires cueing < 10% of the time.  Social Interaction Social Interaction assist level: Interacts appropriately 90% of the time - Needs monitoring or encouragement for participation or interaction.  Problem Solving Problem solving assist level: Solves basic 75 - 89% of the time/requires cueing 10 - 24% of the time  Memory Memory assist level: Recognizes or recalls 75 - 89% of the time/requires cueing 10 - 24% of the time   Short Term Goals: Week 1: SLP Short Term Goal 1 (Week 1): STGs=LTGs  Refer to Care Plan for Long Term Goals  Recommendations for other services: Neuropsych  Discharge Criteria: Patient will be discharged from SLP if patient refuses treatment 3 consecutive times without medical reason, if treatment goals not met, if there is a change in medical status, if patient makes no progress towards goals or if  patient is discharged from hospital.  The above assessment, treatment plan, treatment alternatives and goals were discussed and mutually agreed upon: by patient and by family  ,  03/25/2017, 3:25 PM

## 2017-03-25 NOTE — Evaluation (Signed)
Physical Therapy Assessment and Plan  Patient Details  Name: [name] [name] MRN: [id number] Date of Birth: 06-18-1924  PT Diagnosis: Abnormality of gait, Coordination disorder and Impaired sensation Rehab Potential: Good ELOS: 7-10 days   Today's Date: 03/25/2017 PT Individual Time: 1430-1530 PT Individual Time Calculation (min): 60 min    Problem List:  Patient Active Problem List   Diagnosis Date Noted  . Gastroesophageal reflux disease   . Brainstem infarct, acute 03/24/2017  . Stroke-like symptoms   . Benign essential HTN   . History of supraventricular tachycardia   . Hypokalemia   . Hyponatremia   . Stroke ([place]) 03/18/2017  . Other forms of angina pectoris ([place]) 08/07/2015  . Pre-diabetes   . Blind left eye- (Fuch's disease) 03/05/2015  . Diverticulitis 12/19/2014  . UTI (lower urinary tract infection) 12/19/2014  . Essential hypertension 12/19/2014  . CAD (coronary artery disease) 12/19/2014  . Diverticulitis of large intestine without perforation or abscess without bleeding   . History of Rt brain stroke Oct 2015 06/14/2014  . Headache 06/14/2014  . Malignant hypertension 01/20/2014  . Hypertensive urgency, malignant 01/20/2014  . NSTEMI Nov 2015- conservative Rx 01/20/2014  . Chest pain with moderate risk of acute coronary syndrome 01/19/2014  . Aphasia 01/04/2014  . Paresthesia 01/04/2014  . Acute CVA (cerebrovascular accident) ([place]) 01/04/2014  . LLQ abdominal pain 01/01/2014  . Generalized abdominal pain 01/01/2014  . History of diverticulitis 01/01/2014  . Diverticulitis of colon without hemorrhage 12/07/2013  . Diaphoresis 12/29/2012  . Anemia 12/28/2012  . Sepsis ([place]) 12/24/2012  . Cellulitis of leg, right 12/24/2012  . Pyuria 12/24/2012  . Hypoxemia 12/24/2012  . Acute on chronic diastolic heart failure ([place]) 12/24/2012  . Postoperative anemia due to acute blood loss 12/15/2012  . Sleep disturbance 10/03/2012  . Abdominal pulsatile mass 08/24/2012   . Depression 07/12/2012  . OA (osteoarthritis) of knee 06/27/2012  . History of PSVT 12/15/2011    Past Medical History:  Past Medical History:  Diagnosis Date  . Arthritis    "thumbs, joints" (03/23/2017  . Basal cell carcinoma    "several; scattered over my face, hands, leg some cut off; some burned off"  . Blind left eye    a. Corneal transplant x3 with rejection  . CAD (coronary artery disease)    a. s/p NSTEMI 2015 treated conservatively; nuclear stress test low risk. b. Continued angina despite med rx 07/2015 - s/p io [place] Stent to ramus intermedius with mod LAD stenosis neg by FFR.  . Complication of anesthesia    "very sensitive to RX"  . Diverticulitis large intestine   . Diverticulosis   . Family history of adverse reaction to anesthesia    "we all get PONV" (1/1/201)  . Gastritis   . GERD (gastroesophageal reflux disease)   . Hayfever   . Helicobacter pylori (H. pylori) 05/22/02   RUT-Positive  . Hypertension   . Myocardial infarction ([place]) 2015  . Squamous carcinoma    "several; scattered over my face, hands, leg some cut off; some burned off"  . Stroke [facility]) 2015   denies residual on 08/07/2015  . Stroke [facility]) 03/18/2017   "has effected her vision, balance, some memory" (03/23/2017)  . SVT (supraventricular tachycardia) (H[facility])    a. seen by Dr. Caryl Comes - has loop recorder in. Patient has declined amiodarone due to side effect profile  . Varicose veins    Past Surgical History:  Past Surgical History:  Procedure Laterality Date  . BASAL CELL  CARCINOMA EXCISION     "several; scattered over my face, hands, leg some cut off; some burned off"  . CARDIAC CATHETERIZATION N/A 08/07/2015   Procedure: Left Heart Cath and Coronary Angiography;  Surgeon: [name], MD;  Location: [place] CV LAB;  Service: Cardiovascular;  Laterality: N/A;  . CARDIAC CATHETERIZATION N/A 08/07/2015   Procedure: Coronary Stent Intervention;  Surgeon: [name], MD;  Location: [place] CV LAB;  Service: Cardiovascular;  Laterality: N/A;  . CARDIAC CATHETERIZATION N/A 08/07/2015   Procedure: Intravascular Pressure Wire/FFR Study;  Surgeon: [name], MD;  Location: [place] CV LAB;  Service: Cardiovascular;  Laterality: N/A;  . CATARACT EXTRACTION W/ INTRAOCULAR LENS  IMPLANT, BILATERAL Bilateral 2005  . CLOSED REDUCTION SHOULDER DISLOCATION Left 2016 X 2  . CORNEAL TRANSPLANT Bilateral right 2009, left 2009    3 in left eye (last 2 failed), 1 in right eye  . DILATION AND CURETTAGE OF UTERUS    . EYE SURGERY    . JOINT REPLACEMENT    . [place]   "had gangrene in it"  . LOOP RECORDER IMPLANT N/A 01/05/2014   Procedure: LOOP RECORDER IMPLANT;  Surgeon: Coralyn Mark, MD;  Location: [place] CATH LAB;  Service: Cardiovascular;  Laterality: N/A;  . SQUAMOUS CELL CARCINOMA EXCISION     "several; scattered over my face, hands, leg some cut off; some burned off"  . TONSILLECTOMY    . TOTAL ABDOMINAL HYSTERECTOMY  11/1983   "ovaries and all"  . TOTAL KNEE ARTHROPLASTY Left 06/27/2012   Procedure: LEFT TOTAL KNEE ARTHROPLASTY;  Surgeon: Gearlean Alf, MD;  Location: [facility];  Service: Orthopedics;  Laterality: Left;  . TOTAL KNEE ARTHROPLASTY Right 12/12/2012   Procedure: RIGHT TOTAL KNEE ARTHROPLASTY;  Surgeon: Gearlean Alf, MD;  Location: [facility];  Service: Orthopedics;  Laterality: Right;  . TUBAL LIGATION  1966    Assessment & Plan Clinical Impression: Patient is a 84 y.o. year old female with recent admission to the hospital on Love a 84 y.o.femalewith history of HTN, SVT, CAD, Fuch's disease with left eye blindness and decreased vision right eye, blance deficits; who was admitted on 03/18/17 with sudden loss of vision in right eye, left facial numbness and elevated BP.CTA head/neck showed new short segment occlusion of R-PCA at origin with new severe distal left P2 stenosis. Vision improved to baseline and tPA not given. MRI  brainreviewed, showing right brainstem infarct. Per report, acute/early subacute infarct in right anteromedial midbrain with involvement of right cerebral peduncle and moderate chronic small vessel disease with moderate parenchymal brain volume loss.  Dr. Leonie Man recommended DAPT for stroke due to SVD v/s HTN emergency. Loop recorder interrogated-- no episodic a fib but has had frequent intermittent SVTs.  Patient with resultant  balance deficits, left inattention, cognitive deficits with deficits in problem solving functional tasks as well as poor safety awareness.  Patient transferred to CIR on 03/24/2017 .   Patient currently requires min with mobility secondary to decreased coordination and decreased visual acuity.  Prior to hospitalization, patient was independent  with mobility and lived with Alone in a House home.  Home access is 3 in front, ramp in backStairs to enter.  Patient will benefit from skilled PT intervention to maximize safe functional mobility and minimize fall risk for planned discharge home with 24 hour supervision.  Anticipate patient will benefit from follow up [place] at discharge.  PT - End of Session Activity Tolerance: Tolerates 30+ min  activity with multiple rests Endurance Deficit: Yes PT Assessment Rehab Potential (ACUTE/IP ONLY): Good PT Patient demonstrates impairments in the following area(s): Balance;Endurance;Motor;Perception;Sensory;Safety PT Transfers Functional Problem(s): Bed Mobility;Bed to Chair;Car;Furniture PT Locomotion Functional Problem(s): Ambulation;Stairs PT Plan PT Intensity: Minimum of 1-2 x/day ,45 to 90 minutes PT Frequency: 5 out of 7 days PT Duration Estimated Length of Stay: 7-10 days PT Treatment/Interventions: Ambulation/gait training;Stair training;Visual/perceptual remediation/compensation;Therapeutic Activities;Patient/family education;Balance/vestibular training;Functional electrical stimulation;Therapeutic Exercise;UE/LE Strength  taining/ROM;Functional mobility training;Neuromuscular re-education;UE/LE Coordination activities PT Transfers Anticipated Outcome(s): S PT Locomotion Anticipated Outcome(s): S PT Recommendation Follow Up Recommendations: Home health PT;24 hour supervision/assistance Patient destination: Home Equipment Recommended: Rolling walker with 5" wheels  Skilled Therapeutic Intervention Patient in room in supine with daughter present.  Performed supine to sit with S and daughter assisted pt to bathroom with appropriate cues and guarding techniques as reports OT had checked her off to assist.  Patient toileted and performed hygiene and donned pants with min guard A for balance.  Patient ambulated to therapy gym 150' with min A with HHA and increased speed mod cues for direction changes and for maneuvering around obstacles.  Patient participated in [place] balance assessment scored 32/56 and educated in fall risk.  Negotiated stairs with min a with bilateral rails and increased time due to vision deficits. Gait to ortho gym with RW and min A and mod cues for direction. Negotiated ramp and curb with RW and min A mod cues for safety when changing surfaces.  Patient performed car transfer min A and cues for safety with hand placement.  Performed Nu Step level 4 UE/LE's x 5 min for endurance training.  Patient ambulated to room with RW and mod cues for posture, proximity to walker and direction/obstacles. Noted with turning to L to enter room, pt with LOB to L and walker tipping up on one wheel min A to recover and cues for safety with increased time for turning.  Also noted L LE weakness/fatigue instrumental in LOB.  Patient sit to supine with min A for cues for positioning and left with all needs in reach.   PT Evaluation Precautions/Restrictions Precautions Precautions: Fall Precaution Comments: L eye blindness, and decreased vision R eye General    Pain Pain Assessment Pain Assessment: No/denies pain Home  Living/Prior Functioning Home Living Available Help at Discharge: Family;Available 24 hours/day Type of Home: House Home Access: Stairs to enter [employer] of Steps: 3 in front, ramp in back Home Layout: Multi-level(split level) Alternate Level Stairs-Number of Steps: 5-6 Alternate Level Stairs-Rails: Right Bathroom Shower/Tub: Tub/shower unit;Walk-in shower(walk in shower on bottom level, but normally takes shower on top level in tub shower) Bathroom Toilet: Standard  Lives With: Alone Prior Function Level of Independence: Independent with basic ADLs;Independent with homemaking with ambulation;Independent with gait;Independent with transfers  Able to Take Stairs?: Yes Driving: No Vocation: Retired Leisure: Hobbies-yes (Comment) Comments: likes to work in garden Vision/Perception  Vision - Assessment Eye Alignment: Within Functional Limits  Cognition Overall Cognitive Status: Impaired/Different from baseline Arousal/Alertness: Awake/alert Orientation Level: Oriented X4 Attention: Sustained Sustained Attention: Appears intact Memory: Impaired Memory Impairment: Retrieval deficit;Decreased short term memory Awareness: Appears intact Problem Solving: Impaired Problem Solving Impairment: Verbal basic;Functional basic Safety/Judgment: Appears intact Sensation Sensation Light Touch: Appears Intact Coordination Gross Motor Movements are Fluid and Coordinated: No Heel Shin Test: decreased coordination with heel to shin L on R as compared to R on L, intact to toe tapping Motor  Motor Motor: Within Functional Limits Motor - Skilled Clinical Observations: general  weakness  Mobility Bed Mobility Bed Mobility: Supine to Sit Supine to Sit: 4: Min guard Supine to Sit Details: Verbal cues for safe use of DME/AE Transfers Transfers: Yes Sit to Stand: 4: Min guard Sit to Stand Details: Verbal cues for safe use of DME/AE;Verbal cues for precautions/safety Stand Pivot  Transfers: 4: Min guard Stand Pivot Transfer Details: Verbal cues for technique Locomotion  Ambulation Ambulation: Yes Ambulation/Gait Assistance: 4: Min assist Ambulation Distance (Feet): 200 Feet Assistive device: Rolling walker;1 person hand held assist Ambulation/Gait Assistance Details: Verbal cues for precautions/safety;Verbal cues for technique;Verbal cues for safe use of DME/AE Ambulation/Gait Assistance Details: initially with HHA and increased speed, assist around obstacles and cues for direction; with RW cues for safer speed, for direction and obstacles Stairs / Additional Locomotion Stairs: Yes Stairs Assistance: 4: Min assist Stair Management Technique: Two rails;Step to pattern;Alternating pattern;Forwards Number of Stairs: 4 Height of Stairs: 6 Ramp: 4: Min assist Curb: 4: Min Administrator Mobility: No  Trunk/Postural Assessment  Cervical Assessment Cervical Assessment: Within Functional Limits Thoracic Assessment Thoracic Assessment: Exceptions to WFL(kyphosis) Lumbar Assessment Lumbar Assessment: Within Functional Limits Postural Control Postural Control: Within Functional Limits  Balance Balance Balance Assessed: Yes Standardized Balance Assessment Standardized Balance Assessment: Berg Balance Test Berg Balance Test Sit to Stand: Able to stand  independently using hands Standing Unsupported: Able to stand 2 minutes with supervision Sitting with Back Unsupported but Feet Supported on Floor or Stool: Able to sit safely and securely 2 minutes Stand to Sit: Controls descent by using hands Transfers: Able to transfer with verbal cueing and /or supervision Standing Unsupported with Eyes Closed: Able to stand 10 seconds with supervision Standing Ubsupported with Feet Together: Needs help to attain position but able to stand for 30 seconds with feet together From Standing, Reach Forward with Outstretched Arm: Can reach forward >12 cm safely  (5") From Standing Position, Pick up Object from Floor: Able to pick up shoe, needs supervision From Standing Position, Turn to Look Behind Over each Shoulder: Looks behind one side only/other side shows less weight shift Turn 360 Degrees: Needs close supervision or verbal cueing Standing Unsupported, Alternately Place Feet on Step/Stool: Able to complete >2 steps/needs minimal assist Standing Unsupported, One Foot in Front: Able to take small step independently and hold 30 seconds Standing on One Leg: Unable to try or needs assist to prevent fall Total Score: 32 Dynamic Sitting Balance Dynamic Sitting - Balance Support: No upper extremity supported;Feet supported Dynamic Sitting - Level of Assistance: 5: Stand by assistance Sitting balance - Comments: sitting reaching to close car door Static Standing Balance Static Standing - Balance Support: No upper extremity supported;During functional activity Static Standing - Level of Assistance: 5: Stand by assistance Dynamic Standing Balance Dynamic Standing - Balance Support: No upper extremity supported Dynamic Standing - Level of Assistance: 4: Min assist Extremity Assessment      RLE Assessment RLE Assessment: Within Functional Limits LLE Assessment LLE Assessment: Exceptions to WFL(strength testing wFl, but decreased coordination with heel to shin and noted functional weakness with fatigue during ambulation)   See Function Navigator for Current Functional Status.   Refer to Care Plan for Long Term Goals  Recommendations for other services: Therapeutic Recreation  Kitchen group  Discharge Criteria: Patient will be discharged from PT if patient refuses treatment 3 consecutive times without medical reason, if treatment goals not met, if there is a change in medical status, if patient makes no progress towards goals or if patient  is discharged from hospital.  The above assessment, treatment plan, treatment alternatives and goals were  discussed and mutually agreed upon: by patient  Reginia Naas 03/25/2017, 5:13 PM

## 2017-03-26 ENCOUNTER — Inpatient Hospital Stay (HOSPITAL_COMMUNITY): Payer: PPO | Admitting: Occupational Therapy

## 2017-03-26 ENCOUNTER — Inpatient Hospital Stay (HOSPITAL_COMMUNITY): Payer: PPO | Admitting: Speech Pathology

## 2017-03-26 ENCOUNTER — Inpatient Hospital Stay (HOSPITAL_COMMUNITY): Payer: PPO

## 2017-03-26 NOTE — Progress Notes (Signed)
[facility] PHYSICAL MEDICINE & REHABILITATION     PROGRESS NOTE    Subjective/Complaints: Patient slept well.  States that the headaches are gradually improving.  Had a good day with therapy as well yesterday  ROS: pt denies nausea, vomiting, diarrhea, cough, shortness of breath or chest pain   Objective: Vital Signs: Blood pressure (!) 165/72, pulse 87, temperature 99.2 F (37.3 C), temperature source Oral, resp. rate 17, height 5\' 3"  (1.6 m), weight 80.2 kg (176 lb 12.9 oz), SpO2 96 %. No results found. Recent Labs    03/25/17 0633  WBC 4.2  HGB 13.6  HCT 42.2  PLT 170   Recent Labs    03/23/17 0953 03/25/17 0633  NA 137 138  K 3.7 4.3  CL 100* 103  GLUCOSE 91 96  BUN 15 13  CREATININE 0.66 0.61  CALCIUM 9.0 8.8*   CBG (last 3)  No results for input(s): GLUCAP in the last 72 hours.  Wt Readings from Last 3 Encounters:  03/25/17 80.2 kg (176 lb 12.9 oz)  10/01/16 77.6 kg (171 lb)  04/29/16 77 kg (169 lb 12.8 oz)    Physical Exam:  Constitutional: She is oriented to person, place, and time. She appears well-developed and well-nourished.    No distress HENT:  Head: Normocephalic and atraumatic.  Eyes: Left eye opaque Neck: Normal range of motion. Neck supple.  Cardiovascular: RRR without murmur. No JVD  Respiratory: CTA Bilaterally without wheezes or rales. Normal effort .  GI: Soft. Bowel sounds are normal. She exhibits no distension. There is no tenderness.  Musculoskeletal: She exhibits no edema or tenderness.  Neurological: She is alert and oriented to person, place, and time.  Speech clear and fairly appropriate Left eye opaque with ptosis--no vision through eye Left inattention.  And likely left hemianopsia .  Does lack insight into deficits.  ?No ataxia (limited by vision)  Skin: Skin is warm and dry. No erythema.  Psychiatric: She has a normal mood and affect. Her speech is tangential. She expresses impulsivity. She is inattentive.      Assessment/Plan: 1. Balance and functional deficits secondary to right midbrain infarct which require 3+ hours per day of interdisciplinary therapy in a comprehensive inpatient rehab setting. Physiatrist is providing close team supervision and 24 hour management of active medical problems listed below. Physiatrist and rehab team continue to assess barriers to discharge/monitor patient progress toward functional and medical goals.  Function:  Bathing Bathing position   Position: Shower  Bathing parts Body parts bathed by patient: Right arm, Left arm, Chest, Abdomen, Front perineal area, Buttocks, Right upper leg, Left upper leg, Right lower leg, Left lower leg Body parts bathed by helper: Back  Bathing assist Assist Level: Touching or steadying assistance(Pt > 75%)      Upper Body Dressing/Undressing Upper body dressing   What is the patient wearing?: Bra, Button up shirt Bra - Perfomed by patient: Thread/unthread right bra strap, Thread/unthread left bra strap Bra - Perfomed by helper: Hook/unhook bra (pull down sports bra)     Button up shirt - Perfomed by patient: Thread/unthread right sleeve, Thread/unthread left sleeve, Pull shirt around back, Button/unbutton shirt      Upper body assist Assist Level: Touching or steadying assistance(Pt > 75%)      Lower Body Dressing/Undressing Lower body dressing   What is the patient wearing?: Underwear, Pants, Socks Underwear - Performed by patient: Thread/unthread right underwear leg, Thread/unthread left underwear leg, Pull underwear up/down   Pants- Performed by  patient: Thread/unthread right pants leg, Thread/unthread left pants leg, Pull pants up/down, Fasten/unfasten pants       Socks - Performed by patient: Don/doff right sock, Don/doff left sock                Lower body assist Assist for lower body dressing: Touching or steadying assistance (Pt > 75%)      Toileting Toileting Toileting activity did not occur:  No continent bowel/bladder event Toileting steps completed by patient: Performs perineal hygiene Toileting steps completed by helper: Adjust clothing prior to toileting, Adjust clothing after toileting Toileting Assistive Devices: Grab bar or rail  Toileting assist Assist level: Touching or steadying assistance (Pt.75%)   Transfers Chair/bed transfer   Chair/bed transfer method: Ambulatory Chair/bed transfer assist level: Touching or steadying assistance (Pt > 75%) Chair/bed transfer assistive device: Armrests, Medical sales representative     Max distance: 150 Assist level: Touching or steadying assistance (Pt > 75%)   Wheelchair          Cognition Comprehension Comprehension assist level: Understands basic 90% of the time/cues < 10% of the time  Expression Expression assist level: Expresses basic 90% of the time/requires cueing < 10% of the time.  Social Interaction Social Interaction assist level: Interacts appropriately 90% of the time - Needs monitoring or encouragement for participation or interaction.  Problem Solving Problem solving assist level: Solves basic 75 - 89% of the time/requires cueing 10 - 24% of the time  Memory Memory assist level: Recognizes or recalls 75 - 89% of the time/requires cueing 10 - 24% of the time   Medical Problem List and Plan: 1.  Balance deficits, left inattention, cognitive deficits with deficits in problem solving functional tasks as well as poor safety awareness secondary to right anteromedial midbrain infarct.       -Continue inpatient rehab therapies 2.  DVT Prophylaxis/Anticoagulation: Pharmaceutical: Lovenox 3. Chronic right hip pain/Headaches/Pain Management:  Tylenol prn.  Headaches gradually improving.  4. Mood: LCSW to follow for evaluation and support.  5. Neuropsych: This patient is not fully capable of making decisions on her own behalf. 6. Skin/Wound Care: routine pressure relief measures 7. Fluids/Electrolytes/Nutrition:  Monitor I/O.  Good appetite.      -Continue to encourage p.o. intake 8. HTN: Monitor blood pressure bid.  9. Hyponatremia: Improving. Sodium recovering 10.  Dyslipidemia: on Lipitor.  11. H/o SVTs: Asymptomatic. To follow up with Dr. Burt Knack after discharge.   12. GERD: managed with pepcid.    LOS (Days) 2 A [place] T, MD 03/26/2017 9:11 AM

## 2017-03-26 NOTE — IPOC Note (Signed)
Overall Plan of Care [facility]) Patient Details Name: [name] [name] MRN: [id number] DOB: 1924-08-31  Admitting Diagnosis: <principal problem not specified>brainstem infarct  Hospital Problems: Active Problems:   Brainstem infarct, acute     Functional Problem List: Nursing Endurance, Medication Management, Motor, Pain, Perception, Safety, Sensory  PT Balance, Endurance, Motor, Perception, Sensory, Safety  OT Balance, Safety, Vision, Endurance, Motor, Pain, Cognition  SLP Cognition  TR         Basic ADL's: OT Grooming, Bathing, Dressing, Toileting     Advanced  ADL's: OT Simple Meal Preparation, Laundry     Transfers: PT Bed Mobility, Bed to Chair, Car, Manufacturing systems engineer, Metallurgist: PT Ambulation, Stairs     Additional Impairments: OT None  SLP Social Cognition, Swallowing   Problem Solving, Memory, Attention  TR      Anticipated Outcomes Item Anticipated Outcome  Self Feeding n/a  Swallowing  Mod I   Basic self-care  supervision  Toileting  supervision   Bathroom Transfers supervision  Bowel/Bladder  Mod I   Transfers  S  Locomotion  S  Communication     Cognition  Supervision   Pain  less than 2  Safety/Judgment  mod I    Therapy Plan: PT Intensity: Minimum of 1-2 x/day ,45 to 90 minutes PT Frequency: 5 out of 7 days PT Duration Estimated Length of Stay: 7-10 days OT Intensity: Minimum of 1-2 x/day, 45 to 90 minutes OT Frequency: 5 out of 7 days OT Duration/Estimated Length of Stay: 7-10 days SLP Intensity: Minumum of 1-2 x/day, 30 to 90 minutes SLP Frequency: 3 to 5 out of 7 days SLP Duration/Estimated Length of Stay: 7-10 days    Team Interventions: Nursing Interventions Patient/Family Education, Disease Management/Prevention, Pain Management, Medication Management, Dysphagia/Aspiration Precaution Training, Cognitive Remediation/Compensation  PT interventions Ambulation/gait training, Stair training, Visual/perceptual  remediation/compensation, Therapeutic Activities, Patient/family education, Training and development officer, Functional electrical stimulation, Therapeutic Exercise, UE/LE Strength taining/ROM, Functional mobility training, Neuromuscular re-education, UE/LE Coordination activities  OT Interventions Balance/vestibular training, Discharge planning, Pain management, Self Care/advanced ADL retraining, Therapeutic Activities, UE/LE Coordination activities, Cognitive remediation/compensation, Functional mobility training, Patient/family education, Therapeutic Exercise, Visual/perceptual remediation/compensation, Community reintegration, DME/adaptive equipment instruction, Psychosocial support, UE/LE Strength taining/ROM  SLP Interventions Cognitive remediation/compensation, Environmental controls, Internal/external aids, Cueing hierarchy, Dysphagia/aspiration precaution training, Functional tasks, Patient/family education, Therapeutic Activities  TR Interventions    SW/CM Interventions Discharge Planning, Psychosocial Support, Patient/Family Education   Barriers to Discharge MD  Medical stability  Nursing Medication compliance    PT      OT Other (comments) none known at this time  SLP      SW       Team Discharge Planning: Destination: PT-Home ,OT- Home , SLP-Home Projected Follow-up: PT-Home health PT, 24 hour supervision/assistance, OT-  Home health OT, 24 hour supervision/assistance, SLP-24 hour supervision/assistance, Outpatient SLP Projected Equipment Needs: PT-Rolling walker with 5" wheels, OT- To be determined, SLP-None recommended by SLP Equipment Details: PT- , OT-  Patient/family involved in discharge planning: PT- Patient,  OT-Patient, Family member/caregiver, SLP-Patient, Family member/caregiver  MD ELOS: 7-10 days Medical Rehab Prognosis:  Excellent Assessment: The patient has been admitted for CIR therapies with the diagnosis of right brain stem infarct. The team will be addressing  functional mobility, strength, stamina, balance, safety, adaptive techniques and equipment, self-care, bowel and bladder mgt, patient and caregiver education, NMR, community reentry, cognition, communication. Goals have been set at supervision for mobility, self-care and cognition.    [name] [name].  [name] Plummer, MD, [facility]      See Team Conference Notes for weekly updates to the plan of care

## 2017-03-26 NOTE — Progress Notes (Signed)
Pt complained of heart palpitation radiating to mid back and back of head. Pt stated " it feels my chest and back is pounding hard." Patient denies headache, chest pain or dyspnea. Vitals signs checked. Pt reassured that staff are available if she needs anything. Daughter arrived at bedside and informed of patient complaint. Dr. Letta Pate informed. Will monitor.

## 2017-03-26 NOTE — Progress Notes (Signed)
Speech Language Pathology Daily Session Note  Patient Details  Name: [name] [name] MRN: [id number] Date of Birth: May 26, 1924  Today's Date: 03/26/2017 SLP Individual Time: 1100-1200 SLP Individual Time Calculation (min): 60 min  Short Term Goals: Week 1: SLP Short Term Goal 1 (Week 1): STGs=LTGs  Skilled Therapeutic Interventions: Skilled treatment session focused on cognitive and dysphagia goals. SLP facilitated session by providing supervision-Min A verbal cues for problem solving in regards to compensate for decreased visual acuity during a pattern making task. Patient also consumed lunch meal of Dys. 3 textures with thin liquids via cup and straw without overt s/s of aspiration and Mod I. However, patient reported she feels her swallowing feels better without straws, therefore, straws will be eliminated.  Recommend patient continue current diet with trials of regular textures. Patient left upright in recliner with all needs within reach. Continue with current plan of care.      Function:  Eating Eating   Modified Consistency Diet: Yes Eating Assist Level: Set up assist for;More than reasonable amount of time;Supervision or verbal cues   Eating Set Up Assist For: Opening containers       Cognition Comprehension Comprehension assist level: Understands basic 90% of the time/cues < 10% of the time  Expression   Expression assist level: Expresses basic 90% of the time/requires cueing < 10% of the time.  Social Interaction Social Interaction assist level: Interacts appropriately 90% of the time - Needs monitoring or encouragement for participation or interaction.  Problem Solving Problem solving assist level: Solves basic 75 - 89% of the time/requires cueing 10 - 24% of the time  Memory Memory assist level: Recognizes or recalls 75 - 89% of the time/requires cueing 10 - 24% of the time    Pain Pain Assessment Pain Assessment: No/denies pain  Therapy/Group: Individual  Therapy  Cam Harnden 03/26/2017, 3:09 PM

## 2017-03-26 NOTE — Progress Notes (Signed)
Occupational Therapy Session Note  Patient Details  Name: [name] [name] MRN: [id number] Date of Birth: [birth year]  Today's Date: 03/26/2017 OT Individual Time: 0315-9458 OT Individual Time Calculation (min): 64 min    Short Term Goals: Week 1:  OT Short Term Goal 1 (Week 1): STGs=LTGs secondary to estimated short LOS  Skilled Therapeutic Interventions/Progress Updates:    Pt greeted in bed eating breakfast. Educated her on using compensatory tactile strategies for opening containers, small condiment packets, and managing beverages. Pt able to do all of these things with supervision. Afterwards pt ambulated to dresser with HHA and retrieved clothing, relying on tactile sense/color discrimination to gather necessary items. Pt dressing EOB with overall steady assist. Min A for finding items on bed with little visual contrast. Pt completed oral care/grooming tasks while standing at sink for balance challenges. She required assist for accurate depth perception when applying toothpaste onto toothbrush. Once again, relied heavily on touch for meeting task demands. At end of tx pt ambulated with HHA to recliner. Pt left with all needs within reach and LEs elevated at session exit.   Therapy Documentation Precautions:  Precautions Precautions: Fall Precaution Comments: L eye blindness, and decreased vision R eye Restrictions Weight Bearing Restrictions: (P) No Pain: No c/o pain during tx  Pain Assessment Pain Score: 0-No pain ADL:     See Function Navigator for Current Functional Status.   Therapy/Group: Individual Therapy  [name] [name] A Roseana Rhine 03/26/2017, 12:12 PM

## 2017-03-26 NOTE — Progress Notes (Signed)
Social Work Social Work Assessment and Plan  Patient Details  Name: [name] [name] MRN: [id number] Date of Birth: 05-22-1924  Today's Date: 03/26/2017  Problem List:  Patient Active Problem List   Diagnosis Date Noted  . Gastroesophageal reflux disease   . Brainstem infarct, acute 03/24/2017  . Stroke-like symptoms   . Benign essential HTN   . History of supraventricular tachycardia   . Hypokalemia   . Hyponatremia   . Stroke ([place]) 03/18/2017  . Other forms of angina pectoris ([place]) 08/07/2015  . Pre-diabetes   . Blind left eye- (Fuch's disease) 03/05/2015  . Diverticulitis 12/19/2014  . UTI (lower urinary tract infection) 12/19/2014  . Essential hypertension 12/19/2014  . CAD (coronary artery disease) 12/19/2014  . Diverticulitis of large intestine without perforation or abscess without bleeding   . History of Rt brain stroke Oct 2015 06/14/2014  . Headache 06/14/2014  . Malignant hypertension 01/20/2014  . Hypertensive urgency, malignant 01/20/2014  . NSTEMI Nov 2015- conservative Rx 01/20/2014  . Chest pain with moderate risk of acute coronary syndrome 01/19/2014  . Aphasia 01/04/2014  . Paresthesia 01/04/2014  . Acute CVA (cerebrovascular accident) ([place]) 01/04/2014  . LLQ abdominal pain 01/01/2014  . Generalized abdominal pain 01/01/2014  . History of diverticulitis 01/01/2014  . Diverticulitis of colon without hemorrhage 12/07/2013  . Diaphoresis 12/29/2012  . Anemia 12/28/2012  . Sepsis ([place]) 12/24/2012  . Cellulitis of leg, right 12/24/2012  . Pyuria 12/24/2012  . Hypoxemia 12/24/2012  . Acute on chronic diastolic heart failure ([place]) 12/24/2012  . Postoperative anemia due to acute blood loss 12/15/2012  . Sleep disturbance 10/03/2012  . Abdominal pulsatile mass 08/24/2012  . Depression 07/12/2012  . OA (osteoarthritis) of knee 06/27/2012  . History of PSVT 12/15/2011   Past Medical History:  Past Medical History:  Diagnosis Date  . Arthritis    "thumbs,  joints" (03/23/2017  . Basal cell carcinoma    "several; scattered over my face, hands, leg some cut off; some burned off"  . Blind left eye    a. Corneal transplant x3 with rejection  . CAD (coronary artery disease)    a. s/p NSTEMI 2015 treated conservatively; nuclear stress test low risk. b. Continued angina despite med rx 07/2015 - s/p io [place] Stent to ramus intermedius with mod LAD stenosis neg by FFR.  . Complication of anesthesia    "very sensitive to RX"  . Diverticulitis large intestine   . Diverticulosis   . Family history of adverse reaction to anesthesia    "we all get PONV" (1/1/201)  . Gastritis   . GERD (gastroesophageal reflux disease)   . Hayfever   . Helicobacter pylori (H. pylori) 05/22/02   RUT-Positive  . Hypertension   . Myocardial infarction ([place]) 2015  . Squamous carcinoma    "several; scattered over my face, hands, leg some cut off; some burned off"  . Stroke [facility]) 2015   denies residual on 08/07/2015  . Stroke [facility]) 03/18/2017   "has effected her vision, balance, some memory" (03/23/2017)  . SVT (supraventricular tachycardia) ([facility])    a. seen by Dr. Caryl Comes - has loop recorder in. Patient has declined amiodarone due to side effect profile  . Varicose veins    Past Surgical History:  Past Surgical History:  Procedure Laterality Date  . BASAL CELL CARCINOMA EXCISION     "several; scattered over my face, hands, leg some cut off; some burned off"  . CARDIAC CATHETERIZATION N/A 08/07/2015   Procedure: Left Heart  Cath and Coronary Angiography;  Surgeon: [name], MD;  Location: [place] CV LAB;  Service: Cardiovascular;  Laterality: N/A;  . CARDIAC CATHETERIZATION N/A 08/07/2015   Procedure: Coronary Stent Intervention;  Surgeon: [name], MD;  Location: [place] CV LAB;  Service: Cardiovascular;  Laterality: N/A;  . CARDIAC CATHETERIZATION N/A 08/07/2015   Procedure: Intravascular Pressure Wire/FFR Study;  Surgeon: [name], MD;  Location: [place] CV LAB;  Service: Cardiovascular;  Laterality: N/A;  . CATARACT EXTRACTION W/ INTRAOCULAR LENS  IMPLANT, BILATERAL Bilateral 2005  . CLOSED REDUCTION SHOULDER DISLOCATION Left 2016 X 2  . CORNEAL TRANSPLANT Bilateral right 2009, left 2009    3 in left eye (last 2 failed), 1 in right eye  . DILATION AND CURETTAGE OF UTERUS    . EYE SURGERY    . JOINT REPLACEMENT    . [place]   "had gangrene in it"  . LOOP RECORDER IMPLANT N/A 01/05/2014   Procedure: LOOP RECORDER IMPLANT;  Surgeon: Coralyn Mark, MD;  Location: [place] CATH LAB;  Service: Cardiovascular;  Laterality: N/A;  . SQUAMOUS CELL CARCINOMA EXCISION     "several; scattered over my face, hands, leg some cut off; some burned off"  . TONSILLECTOMY    . TOTAL ABDOMINAL HYSTERECTOMY  11/1983   "ovaries and all"  . TOTAL KNEE ARTHROPLASTY Left 06/27/2012   Procedure: LEFT TOTAL KNEE ARTHROPLASTY;  Surgeon: Gearlean Alf, MD;  Location: [facility] ORS;  Service: Orthopedics;  Laterality: Left;  . TOTAL KNEE ARTHROPLASTY Right 12/12/2012   Procedure: RIGHT TOTAL KNEE ARTHROPLASTY;  Surgeon: Gearlean Alf, MD;  Location: [facility] ORS;  Service: Orthopedics;  Laterality: Right;  . TUBAL LIGATION  1966   Social History:  reports that  has never smoked. she has never used smokeless tobacco. She reports that she does not drink alcohol or use drugs.  Family / Support Systems Marital Status: Widow/Widower How Long?: ~11 yrs Patient Roles: (S) Parent, Other (Comment)(grandparent) Children: daughter, [name] [name] @ (C) [phone];  son, [name] [name] @ (C) [phone]; daughter, [name] [name] [facility]) @ [phone] Anticipated Caregiver: Family Ability/Limitations of Caregiver: Dtr [name] [name] does not work.  Son works.  Another daughter lives in [place]. Caregiver Availability: Intermittent Family Dynamics: Patient's daughter, [name] [name], at bedside and very supportive of patient as we complete the assessment and interview.   Patient and daughter note that they are a very close knit family he will very likely the patient has been as independent she is up until the stroke.  Social History Preferred language: English Religion: Baptist Cultural Background: None Read: Yes Write: Yes Employment Status: Retired Freight forwarder Issues: None Guardian/Conservator: None - per MD, patient is not fully capable of making decisions on her own behalf.  Defer to adult children.   Abuse/Neglect Abuse/Neglect Assessment Can Be Completed: Yes Physical Abuse: Denies Verbal Abuse: Denies Sexual Abuse: Denies Exploitation of patient/patient's resources: Denies Self-Neglect: Denies  Emotional Status Pt's affect, behavior adn adjustment status: Very pleasant, elderly woman lying in bed with daughter at bedside.  Please see assessment and interview without any difficulty.  He talks easily about her frustration with her limitations stroke and her loss of independence.  While pt seems to have a good awareness of her resulting deficits, she is reluctant to accept that she will need someone to stay with her 24/7 at discharge.  She and daughter both become tearful as they acknowledged that this will be a difficult life change for her as  well as for the family. Recent Psychosocial Issues: None Pyschiatric History: None Substance Abuse History: None  Patient / Family Perceptions, Expectations & Goals Pt/Family understanding of illness & functional limitations: Patient and daughter good, basic understanding of her stroke and the resulting deficits.  They report that they understand the reasons that patient will need 24/7 at home, however, they are concerned about how this will be done. Premorbid pt/family roles/activities: Patient was completely independent PTA and  still driving. Anticipated changes in roles/activities/participation: Patient is going to require 24/7 supervision per therapy goals and family is working together  to coordinate this care. Pt/family expectations/goals: Per pt, "I just want to be able to get back to living on my own if I can."  [employer]: None Premorbid Home Care/DME Agencies: None Transportation available at discharge: yes Resource referrals recommended: Neuropsychology  Discharge Planning Living Arrangements: Alone Support Systems: Children, Other relatives Type of Residence: Private residence Insurance Resources: Multimedia programmer (specify)([employer]) Financial Resources: Social Security Financial Screen Referred: No Living Expenses: Own Money Management: Patient Does the patient have any problems obtaining your medications?: No Home Management: Pt was independent PTA Patient/Family Preliminary Plans: Patient and daughter report their plan is for patient to return to her own home.  They are aware that treatment team  will be recommending 24/7.  Daughter reports that she will speak with her siblings and they will work Social Work Anticipated Follow Up Needs: HH/OP Expected length of stay: 7-10 days  Clinical Impression Very pleasant, elderly woman here following a CVA. She has been completely independent up until this event and talks openly and tearfully about her loss of independence.  She and daughter recognize that 24/7 support will be needed, however, pt does not want to have to have this and daughter not sure how family will cover.  Plan to refer for neuropsychology consult for added support for patient.  Social work to follow for discharge planning and support needs as well. [name] [name] 03/26/2017, 2:27 PM

## 2017-03-26 NOTE — Progress Notes (Signed)
Physical Therapy Session Note  Patient Details  Name: [name] [name] MRN: [id number] Date of Birth: [birth year]  Today's Date: 03/26/2017 PT Individual Time:Session1:  1030-1100; Session2:1515 1600 PT Individual Time Calculation (min): 30 min, 45 min  Short Term Goals: Week 1:  PT Short Term Goal 1 (Week 1): STG=LTG due to ELOS  Skilled Therapeutic Interventions/Progress Updates:    Session1: Patient in recliner in room.  Sit to stand minguard and ambulated with RW and min A 150' to gym with mod cues for speed, posture and proximity. Patient performed dynamic balance activities to include static standing with ball toss to self with feet apart and then closer together with S.  In parallel bars feet together 30 sec trials x 3.  Alternate taps to cones with one UE support, side stepping with min A no UE support, tandem walk fwd and back with UE support and minguard A.  Standing with min guard to S no UE support alternate taps to 4" step with cues for slower speed and increased focus.  Standing with R foot on 4" step for L standing balance.  Gait to room with min A with RW and cues for obstacles, turns/direction. Left in recliner with call button in reach.  Session2:Patient in bed requesting to check of her other daughter, Neoma Laming, to help her to the bathroom.  She demonstrated appropriate technique with no cues and checked off on care plan.  Patient ambulated with RW to therapy gym min A and mod cues for directions especially to L with assist for safety with L turns.  Patient seated in chair to work on visual recognition of colored bean bags and items pictured on bags.  Able to identify color, but not items pictured or letters on bags.  Discussed likely best vision is to R of R eye and to scan during walking turning head L to see better out of R side of R eye.  Patient ambulated with SPC x 80' & 90' x 2 over tile and carpeted surfaces with min A and increased time reported feeling more unsteady than with  walker.  Practiced transfers from various furniture in ADL apt with min A and increased time with cues for sitting near armrest on couch and scooting to edge of lower deep seat for ease of transfers.  Patient in kitchen to scan visually to find major appliances in kitchen with min cues.  Educated pt/family in need for 24/7 supervision upon d/c as well as in range for LOS.  Gait to room with RW and min A, discussed positioning inside walker for safest balance/position and demonstrated for pt/daughter.  Left pt in supine with daughter present and SW made aware of daughter request for list of sitter agencies.   Therapy Documentation Precautions:  Precautions Precautions: Fall Precaution Comments: L eye blindness, and decreased vision R eye Restrictions Weight Bearing Restrictions: (P) No Pain: Pain Assessment Pain Assessment: No/denies pain Pain Score: 0-No pain Mobility:   Locomotion :      See Function Navigator for Current Functional Status.   Therapy/Group: Individual Therapy  Reginia Naas 03/26/2017, 12:46 PM

## 2017-03-27 ENCOUNTER — Inpatient Hospital Stay (HOSPITAL_COMMUNITY): Payer: PPO | Admitting: Occupational Therapy

## 2017-03-27 ENCOUNTER — Inpatient Hospital Stay (HOSPITAL_COMMUNITY): Payer: PPO | Admitting: Physical Therapy

## 2017-03-27 ENCOUNTER — Inpatient Hospital Stay (HOSPITAL_COMMUNITY): Payer: PPO | Admitting: Speech Pathology

## 2017-03-27 DIAGNOSIS — H4901 Third [oculomotor] nerve palsy, right eye: Secondary | ICD-10-CM

## 2017-03-27 DIAGNOSIS — H53462 Homonymous bilateral field defects, left side: Secondary | ICD-10-CM

## 2017-03-27 MED ORDER — NON FORMULARY: 3.0000 mg | Freq: Every evening | Status: DC | PRN

## 2017-03-27 MED ORDER — MELATONIN 3 MG PO TABS
3.0000 mg | ORAL_TABLET | Freq: Two times a day (BID) | ORAL | Status: DC | PRN
  Administered 2017-03-27: 3 mg via ORAL
  Administered 2017-03-28: 1.5 mg via ORAL
  Administered 2017-03-29 – 2017-03-31 (×3): 3 mg via ORAL
  Administered 2017-04-01: 1.5 mg via ORAL
  Filled 2017-03-27 (×7): qty 1

## 2017-03-27 NOTE — Progress Notes (Signed)
Speech Language Pathology Daily Session Note  Patient Details  Name: [name] [name] MRN: [id number] Date of Birth: 05-14-1924  Today's Date: 03/27/2017 SLP Individual Time: 1045-1130 SLP Individual Time Calculation (min): 45 min  Short Term Goals: Week 1: SLP Short Term Goal 1 (Week 1): STGs=LTGs  Skilled Therapeutic Interventions: Skilled treatment session focused on cognitive and dysphagia goals. SLP facilitated session by providing Min A verbal cues for problem solving during basic self-care tasks. SLP also facilitated session by administering trials of regular textures. Patient consumed textures without overt s/s of aspiration and complete oral clearance. Recommend trial tray prior to upgrade. Patient transferred to bed at end of session due to fatigue. Patient left with alarm on and all needs within reach. Continue with current plan of care.      Function:  Eating Eating   Modified Consistency Diet: Yes Eating Assist Level: Set up assist for;More than reasonable amount of time;Supervision or verbal cues   Eating Set Up Assist For: Opening containers(explaining what she has and where)       Cognition Comprehension Comprehension assist level: Understands basic 90% of the time/cues < 10% of the time  Expression   Expression assist level: Expresses basic 90% of the time/requires cueing < 10% of the time.  Social Interaction Social Interaction assist level: Interacts appropriately 90% of the time - Needs monitoring or encouragement for participation or interaction.  Problem Solving Problem solving assist level: Solves basic 75 - 89% of the time/requires cueing 10 - 24% of the time  Memory Memory assist level: Recognizes or recalls 75 - 89% of the time/requires cueing 10 - 24% of the time    Pain No/Denies Pain   Therapy/Group: Individual Therapy  Kaimana Neuzil, [place] 03/27/2017, 3:14 PM

## 2017-03-27 NOTE — Progress Notes (Signed)
Physical Therapy Session Note  Patient Details  Name: [name] [name] MRN: [id number] Date of Birth: November 10, 1924  Today's Date: 03/27/2017 PT Individual Time: 1300-1400 PT Individual Time Calculation (min): 60 min   Short Term Goals: Week 1:  PT Short Term Goal 1 (Week 1): STG=LTG due to ELOS  Skilled Therapeutic Interventions/Progress Updates:  Pt performed bed mobility with S. Pt ambulated 150 feet x 2 and 200 feet x 2 with min A and verbal cues. Pt performed all transfers with c/s to min guard and verbal cues. In gym treatment focused on NMR utilizing cone taps and alternating cone taps 3 sets x 10 reps each. Pt returned to room following treatment and left sitting up in bed with call bell within reach.   Therapy Documentation Precautions:  Precautions Precautions: Fall Precaution Comments: L eye blindness, and decreased vision R eye Restrictions Weight Bearing Restrictions: No General: Pain: No c/o pain.   See Function Navigator for Current Functional Status.   Therapy/Group: Individual Therapy  Dub Amis 03/27/2017, 3:35 PM

## 2017-03-27 NOTE — Progress Notes (Signed)
Occupational Therapy Session Note  Patient Details  Name: [name] [name] MRN: [id number] Date of Birth: June 17, 1924  Today's Date: 03/27/2017 OT Individual Time: 7342-8768 and 1445-1530 OT Individual Time Calculation (min): 60 min and 45 min  Short Term Goals: Week 1:  OT Short Term Goal 1 (Week 1): STGs=LTGs secondary to estimated short LOS  Skilled Therapeutic Interventions/Progress Updates:    Pt greeted supine in bed with RN present. Just received medication. Agreeable to shower. She ambulated with HHA into bathroom, voided on toilet and then transferred to TTB with cues for locating DME. Pt bathing at sit<stand level with steady assist. Min cues for locating necessary items due to impaired vision. Afterwards pt dressing on toilet with overall steady assist for LB, Min A for hooking bra. Continued education regarding using tactile compensatory strategies for maximizing independence with self care. She then ambulated to recliner and donned hearing aides with setup. Pt was set up for breakfast and left with all needs within reach.   2nd Session 1:1 tx (45 min) Pt greeted supine in bed, requesting to have TV remote modified so she can watch TV in room. Retrieved new remote with larger buttons. Pt-OT collaboration with color coding remote so she can utilize TV controls. Teach back method completed to assess understanding with pt using new remote to navigate channels and change specific features with instruction. She still required min cuing at end of session, however was able to locate controls herself! Pt benefits from additional practice. Pt left supine in bed with all needs within reach.    Therapy Documentation Precautions:  Precautions Precautions: Fall Precaution Comments: L eye blindness, and decreased vision R eye Restrictions Weight Bearing Restrictions: No Pain: Pain Assessment Pain Assessment: No/denies pain Pain Score: 0-No pain ADL:      See Function Navigator for Current  Functional Status.  Therapy/Group: Individual Therapy  [name] 03/27/2017, 12:23 PM

## 2017-03-27 NOTE — Progress Notes (Signed)
[facility]     PROGRESS NOTE    Subjective/Complaints: Family requesting melatonin rather than trazodone.  Headache better after Tylenol  ROS: pt denies nausea, vomiting, diarrhea, cough, shortness of breath or chest pain   Objective: Vital Signs: Blood pressure (!) 142/76, pulse 69, temperature (!) 97.4 F (36.3 C), temperature source Oral, resp. rate 18, height 5\' 3"  (1.6 m), weight 80.2 kg (176 lb 12.9 oz), SpO2 99 %. No results found. Recent Labs    03/25/17 0633  WBC 4.2  HGB 13.6  HCT 42.2  PLT 170   Recent Labs    03/25/17 0633  NA 138  K 4.3  CL 103  GLUCOSE 96  BUN 13  CREATININE 0.61  CALCIUM 8.8*   CBG (last 3)  No results for input(s): GLUCAP in the last 72 hours.  Wt Readings from Last 3 Encounters:  03/25/17 80.2 kg (176 lb 12.9 oz)  10/01/16 77.6 kg (171 lb)  04/29/16 77 kg (169 lb 12.8 oz)    Physical Exam:  Constitutional: She is oriented to person, place, and time. She appears well-developed and well-nourished.    No distress HENT:  Head: Normocephalic and atraumatic.  Eyes: Left eye opaque Neck: Normal range of motion. Neck supple.  Cardiovascular: RRR without murmur. No JVD  Respiratory: CTA Bilaterally without wheezes or rales. Normal effort .  GI: Soft. Bowel sounds are normal. She exhibits no distension. There is no tenderness.  Musculoskeletal: She exhibits no edema or tenderness.  Neurological: She is alert and oriented to person, place, and time.  Speech clear and fairly appropriate Left eye opaque with ptosis--no vision through eye Left inattention.  And likely left hemianopsia .  Does lack insight into deficits.  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 No erythema.  Psychiatric: She has a normal mood and affect. Her speech is tangential. She expresses impulsivity. She is inattentive.     Assessment/Plan: 1. Balance  and functional deficits secondary to right midbrain infarct which require 3+ hours per day of interdisciplinary therapy in a comprehensive inpatient rehab setting. Physiatrist is providing close team supervision and 24 hour management of active medical problems listed below. Physiatrist and rehab team continue to assess barriers to discharge/monitor patient progress toward functional and medical goals.  Function:  Bathing Bathing position   Position: Shower  Bathing parts Body parts bathed by patient: Right arm, Left arm, Chest, Abdomen, Front perineal area, Buttocks, Right upper leg, Left upper leg, Right lower leg, Left lower leg Body parts bathed by helper: Back  Bathing assist Assist Level: Touching or steadying assistance(Pt > 75%)      Upper Body Dressing/Undressing Upper body dressing   What is the patient wearing?: Bra, Button up shirt Bra - Perfomed by patient: Thread/unthread right bra strap, Thread/unthread left bra strap Bra - Perfomed by helper: Hook/unhook bra (pull down sports bra)     Button up shirt - Perfomed by patient: Thread/unthread right sleeve, Thread/unthread left sleeve, Pull shirt around back, Button/unbutton shirt      Upper body assist Assist Level: Touching or steadying assistance(Pt > 75%)      Lower Body Dressing/Undressing Lower body dressing   What is the patient wearing?: Pants, Socks, Shoes Underwear - Performed by patient: Thread/unthread right underwear leg, Thread/unthread left underwear leg, Pull underwear up/down   Pants- Performed by patient: Thread/unthread right pants leg, Thread/unthread left pants leg,  Pull pants up/down, Fasten/unfasten pants       Socks - Performed by patient: Don/doff right sock, Don/doff left sock   Shoes - Performed by patient: Don/doff right shoe, Don/doff left shoe, Fasten right, Fasten left            Lower body assist Assist for lower body dressing: Touching or steadying assistance (Pt > 75%)       Toileting Toileting Toileting activity did not occur: No continent bowel/bladder event Toileting steps completed by patient: Adjust clothing prior to toileting, Performs perineal hygiene, Adjust clothing after toileting Toileting steps completed by helper: Adjust clothing prior to toileting, Adjust clothing after toileting Toileting Assistive Devices: Grab bar or rail  Toileting assist Assist level: Touching or steadying assistance (Pt.75%)   Transfers Chair/bed transfer   Chair/bed transfer method: Ambulatory Chair/bed transfer assist level: Touching or steadying assistance (Pt > 75%) Chair/bed transfer assistive device: Armrests, Walker, Librarian, academic     Max distance: 150 Assist level: Touching or steadying assistance (Pt > 75%)   Wheelchair          Cognition Comprehension Comprehension assist level: Understands basic 90% of the time/cues < 10% of the time  Expression Expression assist level: Expresses basic 90% of the time/requires cueing < 10% of the time.  Social Interaction Social Interaction assist level: Interacts appropriately 90% of the time - Needs monitoring or encouragement for participation or interaction.  Problem Solving Problem solving assist level: Solves basic 75 - 89% of the time/requires cueing 10 - 24% of the time  Memory Memory assist level: Recognizes or recalls 75 - 89% of the time/requires cueing 10 - 24% of the time   Medical Problem List and Plan: 1.  Balance deficits, left inattention, cognitive deficits with deficits in problem solving functional tasks as well as poor safety awareness secondary to right anteromedial midbrain infarct.       -Continue PT OT speech.  Discussed compensate for visual field cut by turning head towards left when looking at objects straight ahead of her 2.  DVT Prophylaxis/Anticoagulation: Pharmaceutical: Lovenox bleeding 3. Chronic right hip pain/Headaches/Pain Management:  Tylenol prn.  Headaches  gradually improving.  4. Mood: LCSW to follow for evaluation and support.  5. Neuropsych: This patient is not fully capable of making decisions on her own behalf. 6. Skin/Wound Care: routine pressure relief measures 7. Fluids/Electrolytes/Nutrition: Monitor I/O.  Good appetite.      -Continue to encourage p.o. intake 8. HTN: Monitor blood pressure bid.  Still with some lability continue to monitor Vitals:   03/27/17 0335 03/27/17 0506  BP: (!) 170/80 (!) 142/76  Pulse: 78 69  Resp: 18   Temp: (!) 97.4 F (36.3 C)   SpO2: 99%    9. Hyponatremia: Improving. Sodium recovering 10.  Dyslipidemia: on Lipitor.  11. H/o SVTs: Asymptomatic. To follow up with Dr. Burt Knack after discharge.   12. GERD: managed with pepcid.  13.  Insomnia, trial of melatonin  LOS (Days) 3 A FACE TO FACE EVALUATION WAS PERFORMED  Charlett Blake, MD 03/27/2017 11:29 AM

## 2017-03-27 NOTE — Plan of Care (Signed)
  Progressing Consults RH STROKE PATIENT EDUCATION Description See Patient Education module for education specifics  03/27/2017 1347 - Progressing by Brita Romp, RN RH SKIN INTEGRITY RH STG SKIN FREE OF INFECTION/BREAKDOWN Description No new breakdown prior to discharge with min assist  03/27/2017 1347 - Progressing by Brita Romp, RN RH STG MAINTAIN SKIN INTEGRITY WITH ASSISTANCE Description STG Maintain Skin Integrity With  Mod I   03/27/2017 1347 - Progressing by Brita Romp, RN RH SAFETY RH STG ADHERE TO SAFETY PRECAUTIONS W/ASSISTANCE/DEVICE Description STG Adhere to Safety Precautions With mod I   03/27/2017 1347 - Progressing by Brita Romp, RN RH COGNITION-NURSING RH STG USES MEMORY AIDS/STRATEGIES W/ASSIST TO PROBLEM SOLVE Description STG Uses Memory Aids/Strategies With mod I  to Problem Solve.  03/27/2017 1347 - Progressing by Brita Romp, RN RH STG ANTICIPATES NEEDS/CALLS FOR ASSIST W/ASSIST/CUES Description STG Anticipates Needs/Calls for Assist With mod I  /Cues.  03/27/2017 1347 - Progressing by Brita Romp, RN RH PAIN MANAGEMENT RH STG PAIN MANAGED AT OR BELOW PT'S PAIN GOAL Description Less than 2   03/27/2017 1347 - Progressing by Brita Romp, RN RH KNOWLEDGE DEFICIT RH STG INCREASE KNOWLEDGE OF HYPERTENSION Description Patient and family will be able to verbalized how to manage HTN with medications and food choices  03/27/2017 1347 - Progressing by Brita Romp, RN

## 2017-03-28 MED ORDER — NEBIVOLOL HCL 2.5 MG PO TABS
2.5000 mg | ORAL_TABLET | Freq: Every day | ORAL | Status: DC
  Administered 2017-03-28 – 2017-04-03 (×7): 2.5 mg via ORAL
  Filled 2017-03-28 (×7): qty 1

## 2017-03-28 NOTE — Plan of Care (Signed)
  Progressing Consults RH STROKE PATIENT EDUCATION Description See Patient Education module for education specifics  03/28/2017 1346 - Progressing by Brita Romp, RN RH SKIN INTEGRITY RH STG SKIN FREE OF INFECTION/BREAKDOWN Description No new breakdown prior to discharge with min assist  03/28/2017 1346 - Progressing by Brita Romp, RN RH STG MAINTAIN SKIN INTEGRITY WITH ASSISTANCE Description STG Maintain Skin Integrity With  Mod I   03/28/2017 1346 - Progressing by Brita Romp, RN RH SAFETY RH STG ADHERE TO SAFETY PRECAUTIONS W/ASSISTANCE/DEVICE Description STG Adhere to Safety Precautions With mod I   03/28/2017 1346 - Progressing by Brita Romp, RN RH COGNITION-NURSING RH STG USES MEMORY AIDS/STRATEGIES W/ASSIST TO PROBLEM SOLVE Description STG Uses Memory Aids/Strategies With mod I  to Problem Solve.  03/28/2017 1346 - Progressing by Brita Romp, RN RH STG ANTICIPATES NEEDS/CALLS FOR ASSIST W/ASSIST/CUES Description STG Anticipates Needs/Calls for Assist With mod I  /Cues.  03/28/2017 1346 - Progressing by Brita Romp, RN RH PAIN MANAGEMENT RH STG PAIN MANAGED AT OR BELOW PT'S PAIN GOAL Description Less than 2   03/28/2017 1346 - Progressing by Brita Romp, RN RH KNOWLEDGE DEFICIT RH STG INCREASE KNOWLEDGE OF HYPERTENSION Description Patient and family will be able to verbalized how to manage HTN with medications and food choices  03/28/2017 1346 - Progressing by Brita Romp, RN

## 2017-03-28 NOTE — Progress Notes (Signed)
[facility]     PROGRESS NOTE    Subjective/Complaints: Patient states that she has no pain complaints today.  She is learning to turn her head toward the left to scan objects  ROS: pt denies nausea, vomiting, diarrhea, cough, shortness of breath or chest pain   Objective: Vital Signs: Blood pressure (!) 152/69, pulse 75, temperature 97.9 F (36.6 C), temperature source Oral, resp. rate 18, height 5\' 3"  (1.6 m), weight 80.2 kg (176 lb 12.9 oz), SpO2 97 %. No results found. No results for input(s): WBC, HGB, HCT, PLT in the last 72 hours. No results for input(s): NA, K, CL, GLUCOSE, BUN, CREATININE, CALCIUM in the last 72 hours.  Invalid input(s): CO CBG (last 3)  No results for input(s): GLUCAP in the last 72 hours.  Wt Readings from Last 3 Encounters:  03/25/17 80.2 kg (176 lb 12.9 oz)  10/01/16 77.6 kg (171 lb)  04/29/16 77 kg (169 lb 12.8 oz)    Physical Exam:  Constitutional: She is oriented to person, place, and time. She appears well-developed and well-nourished.    No distress HENT:  Head: Normocephalic and atraumatic.  Eyes: Left eye opaque Neck: Normal range of motion. Neck supple.  Cardiovascular: RRR without murmur. No JVD  Respiratory: CTA Bilaterally without wheezes or rales. Normal effort .  GI: Soft. Bowel sounds are normal. She exhibits no distension. There is no tenderness.  Musculoskeletal: She exhibits no edema or tenderness.  Neurological: She is alert and oriented to person, place, and time.  Speech clear and fairly appropriate Left eye opaque with ptosis--no vision through eye Left inattention.  And likely left hemianopsia .  Does lack insight into deficits.  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 No erythema.  Psychiatric: She has a normal mood and affect. Her speech is tangential. She expresses impulsivity. She is inattentive.      Assessment/Plan: 1. Balance and functional deficits secondary to right midbrain infarct which require 3+ hours per day of interdisciplinary therapy in a comprehensive inpatient rehab setting. Physiatrist is providing close team supervision and 24 hour management of active medical problems listed below. Physiatrist and rehab team continue to assess barriers to discharge/monitor patient progress toward functional and medical goals.  Function:  Bathing Bathing position   Position: Shower  Bathing parts Body parts bathed by patient: Right arm, Left arm, Chest, Abdomen, Front perineal area, Buttocks, Right upper leg, Left upper leg, Right lower leg, Left lower leg Body parts bathed by helper: Back  Bathing assist Assist Level: Touching or steadying assistance(Pt > 75%)      Upper Body Dressing/Undressing Upper body dressing   What is the patient wearing?: Bra, Button up shirt Bra - Perfomed by patient: Thread/unthread right bra strap, Thread/unthread left bra strap Bra - Perfomed by helper: Hook/unhook bra (pull down sports bra)     Button up shirt - Perfomed by patient: Thread/unthread right sleeve, Thread/unthread left sleeve, Pull shirt around back, Button/unbutton shirt      Upper body assist Assist Level: Touching or steadying assistance(Pt > 75%)      Lower Body Dressing/Undressing Lower body dressing   What is the patient wearing?: Pants, Socks, Shoes, Underwear Underwear - Performed by patient: Thread/unthread right underwear leg, Thread/unthread left underwear leg, Pull underwear up/down   Pants- Performed by patient: Thread/unthread right pants leg, Thread/unthread left pants leg, Pull pants up/down, Fasten/unfasten pants  Socks - Performed by patient: Don/doff right sock, Don/doff left sock   Shoes - Performed by patient: Don/doff right shoe, Don/doff left shoe            Lower body assist Assist for lower body dressing: Touching or steadying assistance  (Pt > 75%)      Toileting Toileting Toileting activity did not occur: No continent bowel/bladder event Toileting steps completed by patient: Adjust clothing prior to toileting, Performs perineal hygiene, Adjust clothing after toileting Toileting steps completed by helper: Adjust clothing prior to toileting, Adjust clothing after toileting Toileting Assistive Devices: Grab bar or rail  Toileting assist Assist level: Supervision or verbal cues   Transfers Chair/bed transfer   Chair/bed transfer method: Ambulatory Chair/bed transfer assist level: Touching or steadying assistance (Pt > 75%) Chair/bed transfer assistive device: Armrests     Locomotion Ambulation     Max distance: 200 Assist level: Touching or steadying assistance (Pt > 75%)   Wheelchair          Cognition Comprehension Comprehension assist level: Understands basic 90% of the time/cues < 10% of the time  Expression Expression assist level: Expresses basic 90% of the time/requires cueing < 10% of the time.  Social Interaction Social Interaction assist level: Interacts appropriately 90% of the time - Needs monitoring or encouragement for participation or interaction.  Problem Solving Problem solving assist level: Solves basic 75 - 89% of the time/requires cueing 10 - 24% of the time  Memory Memory assist level: Recognizes or recalls 75 - 89% of the time/requires cueing 10 - 24% of the time   Medical Problem List and Plan: 1.  Balance deficits, left inattention, cognitive deficits with deficits in problem solving functional tasks as well as poor safety awareness secondary to right anteromedial midbrain infarct.       -Continue PT OT speech.  Discussed compensate for visual field cut by turning head towards left when looking at objects straight ahead of her  2.  DVT Prophylaxis/Anticoagulation: Pharmaceutical: Lovenox bleeding 3. Chronic right hip pain/Headaches/Pain Management:  Tylenol prn.  Headaches gradually  improving.  4. Mood: LCSW to follow for evaluation and support.  5. Neuropsych: This patient is not fully capable of making decisions on her own behalf. 6. Skin/Wound Care: routine pressure relief measures 7. Fluids/Electrolytes/Nutrition: Monitor I/O.  Good appetite.      -Continue to encourage p.o. intake 8. HTN: Monitor blood pressure bid.  Still with some lability continue to monitor On Amlodipine and Bystolic at home, BPs creeping up will resume Bystolic low dose Vitals:   03/28/17 0556 03/28/17 0621  BP: (!) 170/71 (!) 152/69  Pulse: 85 75  Resp: 18   Temp: 97.9 F (36.6 C)   SpO2: 97%    9. Hyponatremia: Resolved 10.  Dyslipidemia: on Lipitor.  11. H/o SVTs: Asymptomatic. To follow up with Dr. Burt Knack after discharge.   12. GERD: managed with pepcid.  13.  Insomnia, trial of melatonin  LOS (Days) 4 A FACE TO FACE EVALUATION WAS PERFORMED  Charlett Blake, MD 03/28/2017 11:27 AM

## 2017-03-29 ENCOUNTER — Inpatient Hospital Stay (HOSPITAL_COMMUNITY): Payer: PPO

## 2017-03-29 ENCOUNTER — Inpatient Hospital Stay (HOSPITAL_COMMUNITY): Payer: PPO | Admitting: Occupational Therapy

## 2017-03-29 NOTE — Progress Notes (Signed)
[facility] PHYSICAL MEDICINE & REHABILITATION     PROGRESS NOTE    Subjective/Complaints: Patient complains of her stomach being a little upset after breakfast.  She feels that something is "stuck" in her upper stomach.  Denies reflux or nausea.m  ROS: pt denies nausea, vomiting, diarrhea, cough, shortness of breath or chest pain   Objective: Vital Signs: Blood pressure (!) 153/57, pulse 79, temperature 98 F (36.7 C), temperature source Oral, resp. rate 18, height 5\' 3"  (1.6 m), weight 80.2 kg (176 lb 12.9 oz), SpO2 100 %. No results found. No results for input(s): WBC, HGB, HCT, PLT in the last 72 hours. No results for input(s): NA, K, CL, GLUCOSE, BUN, CREATININE, CALCIUM in the last 72 hours.  Invalid input(s): CO CBG (last 3)  No results for input(s): GLUCAP in the last 72 hours.  Wt Readings from Last 3 Encounters:  03/25/17 80.2 kg (176 lb 12.9 oz)  10/01/16 77.6 kg (171 lb)  04/29/16 77 kg (169 lb 12.8 oz)    Physical Exam:  Constitutional: She is oriented to person, place, and time. She appears well-developed and well-nourished.    No distress HENT:  Head: Normocephalic and atraumatic.  Eyes: Left eye opaque Neck: Normal range of motion. Neck supple.  Cardiovascular: RRR without murmur. No JVD   Respiratory: CTA Bilaterally without wheezes or rales. Normal effort .  GI: BS +, non-tender, non-distended   Musculoskeletal: She exhibits no edema or tenderness.  Neurological: She is alert and oriented to person, place, and time.  Speech clear and fairly appropriate Left eye opaque with ptosis--no vision through eye--and change Left inattention.  And likely left hemianopsia .    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 No erythema.  Psychiatric: She has a normal mood and affect. Her speech is tangential. She expresses impulsivity. She is inattentive.     Assessment/Plan: 1. Balance and  functional deficits secondary to right midbrain infarct which require 3+ hours per day of interdisciplinary therapy in a comprehensive inpatient rehab setting. Physiatrist is providing close team supervision and 24 hour management of active medical problems listed below. Physiatrist and rehab team continue to assess barriers to discharge/monitor patient progress toward functional and medical goals.  Function:  Bathing Bathing position   Position: Shower  Bathing parts Body parts bathed by patient: Right arm, Left arm, Chest, Abdomen, Front perineal area, Buttocks, Right upper leg, Left upper leg, Right lower leg, Left lower leg Body parts bathed by helper: Back  Bathing assist Assist Level: Touching or steadying assistance(Pt > 75%)      Upper Body Dressing/Undressing Upper body dressing   What is the patient wearing?: Bra, Button up shirt Bra - Perfomed by patient: Thread/unthread right bra strap, Thread/unthread left bra strap Bra - Perfomed by helper: Hook/unhook bra (pull down sports bra)     Button up shirt - Perfomed by patient: Thread/unthread right sleeve, Thread/unthread left sleeve, Pull shirt around back, Button/unbutton shirt      Upper body assist Assist Level: Touching or steadying assistance(Pt > 75%)      Lower Body Dressing/Undressing Lower body dressing   What is the patient wearing?: Pants, Socks, Shoes, Underwear Underwear - Performed by patient: Thread/unthread right underwear leg, Thread/unthread left underwear leg, Pull underwear up/down   Pants- Performed by patient: Thread/unthread right pants leg, Thread/unthread left pants leg, Pull pants up/down, Fasten/unfasten pants       Socks -  Performed by patient: Don/doff right sock, Don/doff left sock   Shoes - Performed by patient: Don/doff right shoe, Don/doff left shoe            Lower body assist Assist for lower body dressing: Touching or steadying assistance (Pt > 75%)      Toileting Toileting  Toileting activity did not occur: No continent bowel/bladder event Toileting steps completed by patient: Adjust clothing prior to toileting, Performs perineal hygiene, Adjust clothing after toileting Toileting steps completed by helper: Adjust clothing prior to toileting, Adjust clothing after toileting Toileting Assistive Devices: Grab bar or rail  Toileting assist Assist level: Supervision or verbal cues   Transfers Chair/bed transfer   Chair/bed transfer method: Ambulatory Chair/bed transfer assist level: Touching or steadying assistance (Pt > 75%) Chair/bed transfer assistive device: Armrests     Locomotion Ambulation     Max distance: 200 Assist level: Touching or steadying assistance (Pt > 75%)   Wheelchair          Cognition Comprehension Comprehension assist level: Understands basic 90% of the time/cues < 10% of the time  Expression Expression assist level: Expresses basic needs/ideas: With extra time/assistive device  Social Interaction Social Interaction assist level: Interacts appropriately 90% of the time - Needs monitoring or encouragement for participation or interaction.  Problem Solving Problem solving assist level: Solves basic 75 - 89% of the time/requires cueing 10 - 24% of the time  Memory Memory assist level: Recognizes or recalls 75 - 89% of the time/requires cueing 10 - 24% of the time   Medical Problem List and Plan: 1.  Balance deficits, left inattention, cognitive deficits with deficits in problem solving functional tasks as well as poor safety awareness secondary to right anteromedial midbrain infarct.       -Continue PT OT speech.     2.  DVT Prophylaxis/Anticoagulation: Pharmaceutical: Lovenox bleeding 3. Chronic right hip pain/Headaches/Pain Management:  Tylenol prn.  Headaches gradually improving.  4. Mood: LCSW to follow for evaluation and support.  5. Neuropsych: This patient is not fully capable of making decisions on her own behalf. 6.  Skin/Wound Care: routine pressure relief measures 7. Fluids/Electrolytes/Nutrition: Monitor I/O.  Good appetite.      -Continue to encourage p.o. intake 8. HTN: Monitor blood pressure bid.  Still with some lability continue to monitor On Amlodipine and Bystolic at home, Bystolic resumed due to gradual elevation of pressures Vitals:   03/28/17 1500 03/29/17 0417  BP: 127/62 (!) 153/57  Pulse: 91 79  Resp: 18 18  Temp: 98.2 F (36.8 C) 98 F (36.7 C)  SpO2: 98% 100%   9. Hyponatremia: Resolved 10.  Dyslipidemia: on Lipitor.  11. H/o SVTs: Asymptomatic. To follow up with Dr. Burt Knack after discharge.   12. GERD: managed with pepcid.  See no concerning GI signs on exam today 13.  Insomnia, trial of melatonin--continue  LOS (Days) 5 A FACE TO FACE EVALUATION WAS PERFORMED  Meredith Staggers, MD 03/29/2017 9:25 AM

## 2017-03-29 NOTE — Progress Notes (Signed)
Speech Language Pathology Daily Session Note  Patient Details  Name: [name] [name] MRN: [id number] Date of Birth: [birth year]  Today's Date: 03/29/2017 SLP Individual Time: 1430-1530 SLP Individual Time Calculation (min): 60 min  Short Term Goals: Week 1: SLP Short Term Goal 1 (Week 1): STGs=LTGs  Skilled Therapeutic Interventions: Skilled ST services focused on cognitive skills. SLP facilitated problem solving in mildly complex novel task, blink with strategy, pt required supervision A verbal cues for basic problem solving and Mod A verbal cues to utilize strategic planning. Pt demonstrated recall of rules with min-supervision A. Pt demonstrated functional problem solving utilzing the phone, contacting grandson, because unable to recall great granddaughter's name. Pt was left in room with call bell within reach. Recommend to continue skilled ST services.      Function:  Eating Eating                 Cognition Comprehension Comprehension assist level: Understands complex 90% of the time/cues 10% of the time  Expression   Expression assist level: Expresses complex ideas: With extra time/assistive device  Social Interaction Social Interaction assist level: Interacts appropriately 90% of the time - Needs monitoring or encouragement for participation or interaction.  Problem Solving Problem solving assist level: Solves basic 75 - 89% of the time/requires cueing 10 - 24% of the time;Solves basic 90% of the time/requires cueing < 10% of the time  Memory Memory assist level: Recognizes or recalls 75 - 89% of the time/requires cueing 10 - 24% of the time;Recognizes or recalls 90% of the time/requires cueing < 10% of the time    Pain Pain Assessment Pain Assessment: No/denies pain  Therapy/Group: Individual Therapy  Starsky Nanna  [facility] 03/29/2017, 3:37 PM

## 2017-03-29 NOTE — Care Management Note (Signed)
Inpatient Rehabilitation Center Individual Statement of Services  Patient Name:  [name] [name]  Date:  03/29/2017  Welcome to the [facility].  Our goal is to provide you with an individualized program based on your diagnosis and situation, designed to meet your specific needs.  With this comprehensive rehabilitation program, you will be expected to participate in at least 3 hours of rehabilitation therapies Monday-Friday, with modified therapy programming on the weekends.  Your rehabilitation program will include the following services:  Physical Therapy (PT), Occupational Therapy (OT), Speech Therapy (ST), 24 hour per day rehabilitation nursing, Therapeutic Recreaction (TR), Neuropsychology, Case Management (Social Worker), Rehabilitation Medicine, Nutrition Services and Pharmacy Services  Weekly team conferences will be held on Tuesdays to discuss your progress.  Your Social Worker will talk with you frequently to get your input and to update you on team discussions.  Team conferences with you and your family in attendance may also be held.  Expected length of stay: 7-10 days Overall anticipated outcome: supervision  Depending on your progress and recovery, your program may change. Your Social Worker will coordinate services and will keep you informed of any changes. Your Social Worker's name and contact numbers are listed  below.  The following services may also be recommended but are not provided by the [facility]:    [place] will be made to provide these services after discharge if needed.  Arrangements include referral to agencies that provide these services.  Your insurance has been verified to be:  [employer] Your primary doctor is:  Darcus Austin  Pertinent information will be shared with your doctor and your insurance company.  Social Worker:  [place], [place] or ([phone]   Information discussed with and copy given to patient by: Lennart Pall, 03/29/2017, 2:47 PM

## 2017-03-29 NOTE — Progress Notes (Addendum)
Physical Therapy Session Note  Patient Details  Name: Shalena Ezzell MRN: [id number] Date of Birth: [birth year]  Today's Date: 03/29/2017 PT Individual Time: 1300-1415 PT Individual Time Calculation (min): 75 min   Short Term Goals: Week 1:  PT Short Term Goal 1 (Week 1): STG=LTG due to ELOS  Skilled Therapeutic Interventions/Progress Updates:    Patient in room with daughter present.  Ambulated to therapy gym min A with RW mod cues for directions, proximity to walker and safety on turns.  Patient performed balance activities in parallel bars consisting of feet together, partial tandem, on foam, and on foam with reaching to touch target and giving and getting ball over opposite shoulders. Performed balance with one foot on foam and one on floor x 30 sec trials.  Patient ambulated with one hand hold A to simulate cane with min A x 150' with shorter step length and R hip retraction.  Side stepping along rail at ramp with 2 UE support, 1 UE support and finger tip support both directions.  Performed 5 min on Nu Step at level 3 UE/LE's for coordination and endurance training.  Patient in stairwell for training on simulated home set up stairway with single rail sideways two hands to one rail min A and cues for foot placement on steps (10 up/down, then 10 down/up).  Patient ambulated to room with RW and left in recliner all needs in reach.   Therapy Documentation Precautions:  Precautions Precautions: Fall Precaution Comments: L eye blindness, and decreased vision R eye Restrictions Weight Bearing Restrictions: No  Pain: Pain Assessment Pain Assessment: No/denies pain Pain Score: 0-No pain   See Function Navigator for Current Functional Status.   Therapy/Group: Individual Therapy  Reginia Naas 03/29/2017, 2:00 PM

## 2017-03-29 NOTE — Progress Notes (Signed)
Occupational Therapy Session Note  Patient Details  Name: [name] [name] MRN: [id number] Date of Birth: 1924/05/18  Today's Date: 03/29/2017 OT Individual Time: 4492-0100 OT Individual Time Calculation (min): 72 min    Short Term Goals: Week 1:  OT Short Term Goal 1 (Week 1): STGs=LTGs secondary to estimated short LOS  Skilled Therapeutic Interventions/Progress Updates:    Upon entering the room, pt supine in bed and reports need to use bathroom. Pt performed bed mobility with supervision and donned B shoes from seated position. Pt ambulating to bathroom with min HHA and min verbal cues secondary to decreased vision. Pt performed toilet needs with min A for balance during clothing management. Pt obtaining clothing items from dresser and bathing at shower level with min A for balance when washing buttocks. Pt crossing ankle to opposite knee to wash B lower legs and feet independently. Pt returning to sit on EOB to don clothing items and requiring rest break with increasing fatigue as session goes on. Pt standing at sink with supervision for grooming tasks. Pt returns to bed at end of session to rest. Bed alarm activated and call bell within reach.   Therapy Documentation Precautions:  Precautions Precautions: Fall Precaution Comments: L eye blindness, and decreased vision R eye Restrictions Weight Bearing Restrictions: No General:   Vital Signs:  Pain: Pain Assessment Pain Score: 0-No pain  See Function Navigator for Current Functional Status.   Therapy/Group: Individual Therapy  Gypsy Decant 03/29/2017, 12:35 PM

## 2017-03-30 ENCOUNTER — Encounter (HOSPITAL_COMMUNITY): Payer: PPO | Admitting: Psychology

## 2017-03-30 ENCOUNTER — Inpatient Hospital Stay (HOSPITAL_COMMUNITY): Payer: PPO | Admitting: Occupational Therapy

## 2017-03-30 ENCOUNTER — Inpatient Hospital Stay (HOSPITAL_COMMUNITY): Payer: PPO | Admitting: Physical Therapy

## 2017-03-30 ENCOUNTER — Inpatient Hospital Stay (HOSPITAL_COMMUNITY): Payer: PPO | Admitting: Speech Pathology

## 2017-03-30 NOTE — Progress Notes (Addendum)
Physical Therapy Session Note  Patient Details  Name: [name] [name] MRN: [id number] Date of Birth: December 04, 1924  Today's Date: 03/30/2017 PT Individual Time: 9563-8756 PT Individual Time Calculation (min): 58 min   Short Term Goals: Week 1:  PT Short Term Goal 1 (Week 1): STG=LTG due to ELOS  Skilled Therapeutic Interventions/Progress Updates:  Pt seen in conjunction with Recreational Therapist. Pt received in recliner in room and agreeable to tx. No c/o pain reported. Session focused on d/c planning and gait with various ADs. Pt ambulates room>gym with RW and min assist and gym>room with rollator and supervision. Pt trialed gait with SPC and small base quad cane with min assist and cuing for forward gaze. Pt trialed gait with rollator and only requires supervision. Pt requires instructional cuing and continued cuing throughout session for proper management of rollator brakes. Pt requires cuing throughout session for pathfinding and obstacle avoidance 2/2 impaired vision and unfamiliar environment. Discussed d/c plans with pt and educated her on PT recommendation of 24 hr supervision with pt reporting she is aware she needs this. Pt reports she has family in the area but is unsure of what best d/c location will be. Pt reported need to use restroom and did so with supervision; pt with continent void and able to perform peri care without assistance. At end of session pt requesting to lie down in bed. Pt left with bed alarm set & all needs within reach.   Therapy Documentation Precautions:  Precautions Precautions: Fall Precaution Comments: L eye blindness, and decreased vision R eye Restrictions Weight Bearing Restrictions: No  Vital Signs: BP = 151/87 mmHg RUE, sitting HR = 72 bpm  See Function Navigator for Current Functional Status.   Therapy/Group: Individual Therapy  Waunita Schooner 03/30/2017, 3:19 PM

## 2017-03-30 NOTE — Progress Notes (Signed)
[place] PHYSICAL MEDICINE & REHABILITATION     PROGRESS NOTE    Subjective/Complaints: Stomach better. Had questions about when vision, sensory symptoms would improve  ROS: pt denies nausea, vomiting, diarrhea, cough, shortness of breath or chest pain   Objective: Vital Signs: Blood pressure (!) 168/90, pulse 72, temperature 97.6 F (36.4 C), temperature source Oral, resp. rate 17, height 5\' 3"  (1.6 m), weight 80.2 kg (176 lb 12.9 oz), SpO2 97 %. No results found. No results for input(s): WBC, HGB, HCT, PLT in the last 72 hours. No results for input(s): NA, K, CL, GLUCOSE, BUN, CREATININE, CALCIUM in the last 72 hours.  Invalid input(s): CO CBG (last 3)  No results for input(s): GLUCAP in the last 72 hours.  Wt Readings from Last 3 Encounters:  03/25/17 80.2 kg (176 lb 12.9 oz)  10/01/16 77.6 kg (171 lb)  04/29/16 77 kg (169 lb 12.8 oz)    Physical Exam:  Constitutional: She is oriented to person, place, and time. She appears well-developed and well-nourished.    No distress HENT:  Head: Normocephalic and atraumatic.  Eyes: Left eye opaque Neck: Normal range of motion. Neck supple.  Cardiovascular: RRR without murmur. No JVD   Respiratory: CTA Bilaterally without wheezes or rales. Normal effort .  GI: BS +, non-tender, non-distended   Musculoskeletal: She exhibits no edema or tenderness.  Neurological: She is alert and oriented to person, place, and time.  Speech clear and fairly appropriate Left eye opaque with ptosis--no vision through eye--and change Left inattention.  And likely left hemianopsia, decreased visual acuity through OD .    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 No erythema.  Psychiatric: She has a normal mood and affect. Her speech is tangential. She expresses impulsivity. She is inattentive.     Assessment/Plan: 1. Balance and functional deficits secondary to right  midbrain infarct which require 3+ hours per day of interdisciplinary therapy in a comprehensive inpatient rehab setting. Physiatrist is providing close team supervision and 24 hour management of active medical problems listed below. Physiatrist and rehab team continue to assess barriers to discharge/monitor patient progress toward functional and medical goals.  Function:  Bathing Bathing position   Position: Shower  Bathing parts Body parts bathed by patient: Right arm, Left arm, Chest, Abdomen, Front perineal area, Buttocks, Right upper leg, Left upper leg, Right lower leg, Left lower leg Body parts bathed by helper: Back  Bathing assist Assist Level: Touching or steadying assistance(Pt > 75%)      Upper Body Dressing/Undressing Upper body dressing   What is the patient wearing?: Bra, Button up shirt Bra - Perfomed by patient: Thread/unthread right bra strap, Thread/unthread left bra strap Bra - Perfomed by helper: Hook/unhook bra (pull down sports bra)     Button up shirt - Perfomed by patient: Thread/unthread right sleeve, Thread/unthread left sleeve, Pull shirt around back, Button/unbutton shirt      Upper body assist Assist Level: Touching or steadying assistance(Pt > 75%)      Lower Body Dressing/Undressing Lower body dressing   What is the patient wearing?: Pants, Socks, Shoes, Underwear Underwear - Performed by patient: Thread/unthread right underwear leg, Thread/unthread left underwear leg, Pull underwear up/down   Pants- Performed by patient: Thread/unthread right pants leg, Thread/unthread left pants leg, Pull pants up/down, Fasten/unfasten pants       Socks - Performed by patient: Don/doff right sock, Don/doff left sock  Shoes - Performed by patient: Don/doff right shoe, Don/doff left shoe, Fasten right, Fasten left            Lower body assist Assist for lower body dressing: Touching or steadying assistance (Pt > 75%)      Toileting Toileting   Toileting  steps completed by patient: Adjust clothing prior to toileting, Performs perineal hygiene, Adjust clothing after toileting Toileting steps completed by helper: Adjust clothing prior to toileting, Adjust clothing after toileting Toileting Assistive Devices: Grab bar or rail  Toileting assist Assist level: Touching or steadying assistance (Pt.75%)   Transfers Chair/bed transfer   Chair/bed transfer method: Ambulatory Chair/bed transfer assist level: Touching or steadying assistance (Pt > 75%) Chair/bed transfer assistive device: Armrests     Locomotion Ambulation     Max distance: 200 Assist level: Touching or steadying assistance (Pt > 75%)   Wheelchair          Cognition Comprehension Comprehension assist level: Understands complex 90% of the time/cues 10% of the time  Expression Expression assist level: Expresses complex ideas: With extra time/assistive device  Social Interaction Social Interaction assist level: Interacts appropriately 90% of the time - Needs monitoring or encouragement for participation or interaction.  Problem Solving Problem solving assist level: Solves basic 75 - 89% of the time/requires cueing 10 - 24% of the time, Solves basic 90% of the time/requires cueing < 10% of the time  Memory Memory assist level: Recognizes or recalls 75 - 89% of the time/requires cueing 10 - 24% of the time, Recognizes or recalls 90% of the time/requires cueing < 10% of the time   Medical Problem List and Plan: 1.  Balance deficits, left inattention, cognitive deficits with deficits in problem solving functional tasks as well as poor safety awareness secondary to right anteromedial midbrain infarct.       -Continue PT OT speech. Team conf today   -reviewed prognosis/recovery with pt at length today    2.  DVT Prophylaxis/Anticoagulation: Pharmaceutical: Lovenox bleeding 3. Chronic right hip pain/Headaches/Pain Management:  Tylenol prn.  Headaches gradually improving.  4. Mood: LCSW  to follow for evaluation and support.  5. Neuropsych: This patient is not fully capable of making decisions on her own behalf. 6. Skin/Wound Care: routine pressure relief measures 7. Fluids/Electrolytes/Nutrition: Monitor I/O.  Good appetite.      -Continue to encourage p.o. intake 8. HTN: Monitor blood pressure bid.  Still with some lability continue to monitor On Amlodipine and Bystolic at home, Bystolic resumed due to gradual elevation of pressures Vitals:   03/30/17 0315 03/30/17 0836  BP: (!) 181/74 (!) 168/90  Pulse: 72   Resp: 17   Temp: 97.6 F (36.4 C)   SpO2: 97%     -may need further titration of regimen 9. Hyponatremia: Resolved 10.  Dyslipidemia: on Lipitor.  11. H/o SVTs: Asymptomatic. To follow up with Dr. Burt Knack after discharge.   12. GERD: managed with pepcid.  See no concerning GI signs on exam today 13.  Insomnia, trial of melatonin--continue  LOS (Days) 6 A FACE TO FACE EVALUATION WAS PERFORMED  Meredith Staggers, MD 03/30/2017 8:37 AM

## 2017-03-30 NOTE — Evaluation (Signed)
Recreational Therapy Assessment and Plan  Patient Details  Name: [name] [name] MRN: [id number] Date of Birth: Feb 17, 1925 Today's Date: 03/30/2017  Rehab Potential: Good ELOS: discharge 04/03/17   Assessment  Problem List:      Patient Active Problem List   Diagnosis Date Noted  . Gastroesophageal reflux disease   . Brainstem infarct, acute 03/24/2017  . Stroke-like symptoms   . Benign essential HTN   . History of supraventricular tachycardia   . Hypokalemia   . Hyponatremia   . Stroke ([place]) 03/18/2017  . Other forms of angina pectoris ([place]) 08/07/2015  . Pre-diabetes   . Blind left eye- (Fuch's disease) 03/05/2015  . Diverticulitis 12/19/2014  . UTI (lower urinary tract infection) 12/19/2014  . Essential hypertension 12/19/2014  . CAD (coronary artery disease) 12/19/2014  . Diverticulitis of large intestine without perforation or abscess without bleeding   . History of Rt brain stroke Oct 2015 06/14/2014  . Headache 06/14/2014  . Malignant hypertension 01/20/2014  . Hypertensive urgency, malignant 01/20/2014  . NSTEMI Nov 2015- conservative Rx 01/20/2014  . Chest pain with moderate risk of acute coronary syndrome 01/19/2014  . Aphasia 01/04/2014  . Paresthesia 01/04/2014  . Acute CVA (cerebrovascular accident) ([place]) 01/04/2014  . LLQ abdominal pain 01/01/2014  . Generalized abdominal pain 01/01/2014  . History of diverticulitis 01/01/2014  . Diverticulitis of colon without hemorrhage 12/07/2013  . Diaphoresis 12/29/2012  . Anemia 12/28/2012  . Sepsis ([place]) 12/24/2012  . Cellulitis of leg, right 12/24/2012  . Pyuria 12/24/2012  . Hypoxemia 12/24/2012  . Acute on chronic diastolic heart failure ([place]) 12/24/2012  . Postoperative anemia due to acute blood loss 12/15/2012  . Sleep disturbance 10/03/2012  . Abdominal pulsatile mass 08/24/2012  . Depression 07/12/2012  . OA (osteoarthritis) of knee 06/27/2012  . History of PSVT 12/15/2011    Past Medical  History:      Past Medical History:  Diagnosis Date  . Arthritis    "thumbs, joints" (03/23/2017  . Basal cell carcinoma    "several; scattered over my face, hands, leg some cut off; some burned off"  . Blind left eye    a. Corneal transplant x3 with rejection  . CAD (coronary artery disease)    a. s/p NSTEMI 2015 treated conservatively; nuclear stress test low risk. b. Continued angina despite med rx 07/2015 - s/p io Freedom Stent to ramus intermedius with mod LAD stenosis neg by FFR.  . Complication of anesthesia    "very sensitive to RX"  . Diverticulitis large intestine   . Diverticulosis   . Family history of adverse reaction to anesthesia    "we all get PONV" (1/1/201)  . Gastritis   . GERD (gastroesophageal reflux disease)   . Hayfever   . Helicobacter pylori (H. pylori) 05/22/02   RUT-Positive  . Hypertension   . Myocardial infarction ([place]) 2015  . Squamous carcinoma    "several; scattered over my face, hands, leg some cut off; some burned off"  . Stroke [facility]) 2015   denies residual on 08/07/2015  . Stroke [facility]) 03/18/2017   "has effected her vision, balance, some memory" (03/23/2017)  . SVT (supraventricular tachycardia) (HCC)    a. seen by Dr. Caryl Comes - has loop recorder in. Patient has declined amiodarone due to side effect profile  . Varicose veins    Past Surgical History:       Past Surgical History:  Procedure Laterality Date  . BASAL CELL CARCINOMA EXCISION     "several; scattered over  my face, hands, leg some cut off; some burned off"  . CARDIAC CATHETERIZATION N/A 08/07/2015   Procedure: Left Heart Cath and Coronary Angiography;  Surgeon: [name], MD;  Location: [place] CV LAB;  Service: Cardiovascular;  Laterality: N/A;  . CARDIAC CATHETERIZATION N/A 08/07/2015   Procedure: Coronary Stent Intervention;  Surgeon: [name], MD;  Location: [place] CV LAB;  Service: Cardiovascular;  Laterality: N/A;  . CARDIAC  CATHETERIZATION N/A 08/07/2015   Procedure: Intravascular Pressure Wire/FFR Study;  Surgeon: [name], MD;  Location: [place] CV LAB;  Service: Cardiovascular;  Laterality: N/A;  . CATARACT EXTRACTION W/ INTRAOCULAR LENS  IMPLANT, BILATERAL Bilateral 2005  . CLOSED REDUCTION SHOULDER DISLOCATION Left 2016 X 2  . CORNEAL TRANSPLANT Bilateral right 2009, left 2009    3 in left eye (last 2 failed), 1 in right eye  . DILATION AND CURETTAGE OF UTERUS    . EYE SURGERY    . JOINT REPLACEMENT    . [place]   "had gangrene in it"  . LOOP RECORDER IMPLANT N/A 01/05/2014   Procedure: LOOP RECORDER IMPLANT;  Surgeon: Coralyn Mark, MD;  Location: [place] CATH LAB;  Service: Cardiovascular;  Laterality: N/A;  . SQUAMOUS CELL CARCINOMA EXCISION     "several; scattered over my face, hands, leg some cut off; some burned off"  . TONSILLECTOMY    . TOTAL ABDOMINAL HYSTERECTOMY  11/1983   "ovaries and all"  . TOTAL KNEE ARTHROPLASTY Left 06/27/2012   Procedure: LEFT TOTAL KNEE ARTHROPLASTY;  Surgeon: Gearlean Alf, MD;  Location: [facility];  Service: Orthopedics;  Laterality: Left;  . TOTAL KNEE ARTHROPLASTY Right 12/12/2012   Procedure: RIGHT TOTAL KNEE ARTHROPLASTY;  Surgeon: Gearlean Alf, MD;  Location: [facility];  Service: Orthopedics;  Laterality: Right;  . TUBAL LIGATION  1966    Assessment & Plan Clinical Impression: Patient is a 84 y.o. year old female with recent admission to the hospital on [place] a 84 y.o.femalewith history of HTN, SVT, CAD, Fuch's disease with left eye blindness and decreased vision right eye, blance deficits; who was admitted on 03/18/17 with sudden loss of vision in right eye, left facial numbness and elevated BP.CTA head/neck showed new short segment occlusion of R-PCA at origin with new severe distal left P2 stenosis. Vision improved to baseline and tPA not given. MRI brainreviewed, showing right brainstem infarct.  Per report, acute/early subacute infarct in right anteromedial midbrain with involvement of right cerebral peduncle and moderate chronic small vessel disease with moderate parenchymal brain volume loss. Dr. Leonie Man recommended DAPT for stroke due to SVD v/s HTN emergency. Loop recorder interrogated-- no episodic a fib but has had frequent intermittent SVTs. Patient with resultant balance deficits, left inattention, cognitive deficits with deficits in problem solving functional tasks as well as poor safety awareness.  Patient transferred to CIR on 03/24/2017.   Pt presents with decreased activity tolerance, decreased functional mobility, decreased balance, decreased vision Limiting pt's independence with leisure/community pursuits.  Leisure History/Participation Premorbid leisure interest/current participation: [place] gardening;Community - Production designer, theatre/television/film;Nature - Vegetable gardening Other Leisure Interests: Television;Cooking/Baking;Housework Leisure Participation Style: With Family/Friends;Alone Psychosocial / Spiritual Spiritual Interests: Church;Womens'Men's Groups Does patient have pets?: No(used to have cats) Social interaction - Mood/Behavior: Cooperative Engineer, drilling for Education?: Yes Recreational Therapy Orientation Orientation -Reviewed with patient: Available activity resources Strengths/Weaknesses Patient Strengths/Abilities: Willingness to participate;Active premorbidly Patient weaknesses: Physical limitations TR Patient demonstrates impairments in the following area(s): Safety;Endurance;Motor  TR Additional Impairment(s): None  Plan Rec Therapy Plan Is patient appropriate for Therapeutic Recreation?: Yes Rehab Potential: Good Treatment times per week: Min 1 TR session/group >25 minutes during LOS Estimated Length of Stay: discharge 04/03/17 TR Treatment/Interventions: Adaptive equipment instruction;Therapeutic  exercise;1:1 session;Community reintegration;Recreation/leisure participation;UE/LE Coordination activities;Balance/vestibular training;Functional mobility training;Patient/family education;Therapeutic activities;Group participation (Comment)  Recommendations for other services: None   Discharge Criteria: Patient will be discharged from TR if patient refuses treatment 3 consecutive times without medical reason.  If treatment goals not met, if there is a change in medical status, if patient makes no progress towards goals or if patient is discharged from hospital.  The above assessment, treatment plan, treatment alternatives and goals were discussed and mutually agreed upon: by patient  [place] 03/30/2017, 3:48 PM

## 2017-03-30 NOTE — Consult Note (Signed)
Neuropsychological Consultation   Patient:   [name] [name]   DOB:   08-31-1924  MR Number:  [id number]  Location:  [place] [facility] [address] [id number] [place] [address] Dept: [phone] ZDG: [phone]           Date of Service:   /10/2017  Start Time:   3 PM End Time:   4 PM  Provider/Observer:  Ilean Skill, Psy.D.       Clinical Neuropsychologist       Billing Code/Service: [phone] 4 Units  Chief Complaint:    Shareka Casale is a 84 year old female with a history of hypertension, SVT, CAD, Fuch's disease with left eye blindness and decreased vision in her right eye.  She has had issues with balance as well.  The patient was admitted on 12/27+2018 after sudden loss of vision in the right eye.  She also describes left facial numbness and elevated blood pressure.  CTA of the head/neck showed new short segment occlusion of right PCA.  While in the emergency department her vision improved to baseline.  MRI revealed right brainstem infarct.  Specifically, the infarct was in the right anterior medial midbrain with involvement of the right cerebral penduncle.  There was also moderate chronic small vessel disease with moderate parenchymal brain volume loss.  The patient is continued to have balance deficits, left inattention, as well as cognitive deficits with problem solving a functional task and poor safety awareness.  Reason for Service:  Keyry Iracheta was referred for neuropsychological consultation due to coping issues and adjustment issues following her brainstem stroke.  Below is the HPI for the current admission.  HPI:  Avice Funchess a 84 y.o.femalewith history of HTN, SVT, CAD, Fuch's disease with left eye blindness and decreased vision right eye, blance deficits; who was admitted on 03/18/17 with sudden loss of vision in right eye, left facial numbness and elevated BP.History taken from chart review  and family.CTA head/neck showed new short segment occlusion of R-PCA at origin with new severe distal left P2 stenosis. Vision improved to baseline and tPA not given. MRI brainreviewed, showing right brainstem infarct. Per report, acute/early subacute infarct in right anteromedial midbrain with involvement of right cerebral peduncle and moderate chronic small vessel disease with moderate parenchymal brain volume loss.  Dr. Leonie Man recommended DAPT for stroke due to SVD v/s HTN emergency. Loop recorder interrogated-- no episodic a fib but has had frequent intermittent SVTs.  Patient with resultant  balance deficits, left inattention, cognitive deficits with deficits in problem solving functional tasks as well as poor safety awareness. CIR recommended due to functional deficits.  Current Status:  During the clinical interview today the patient was present along with her daughter.  The patient has improved significantly over the past week.  Her vision, however, does continue to be impaired in her right eye and she has no vision in her left eye due to prior eye disease.  The patient does have some deficits with regard to problem solving and other executive functioning including difficulties with safety awareness issues.  The patient's memory has improved.  The patient denies any depression or anxiety but does report that her confusion and awareness issues caused a lot of stress initially.  The patient's daughter reports that the patient did have some significant cognitive deficits initially and while these are improving they do persist.  There has been significant improvement in cognitive functioning and self awareness of safety issues.  Behavioral Observation: Elycia Woodside  presents as a 84 y.o.-year-old Right Caucasian Female who appeared her stated age. her dress was Appropriate and she was Well Groomed and her manners were Appropriate to the situation.  her participation was indicative of Appropriate and  Redirectable behaviors.  There were physical disabilities noted.  she displayed an appropriate level of cooperation and motivation.     Interactions:    Active Appropriate and Redirectable  Attention:   abnormal and attention span appeared shorter than expected for age  Memory:   abnormal; remote memory intact, recent memory impaired  Visuo-spatial:  not examined  Speech (Volume):  normal  Speech:   normal; normal  Thought Process:  Coherent and Tangential  Though Content:  WNL; not suicidal  Orientation:   person and situation  Judgment:   Poor  Planning:   Poor  Affect:    Anxious  Mood:    Anxious  Insight:   Lacking  Intelligence:   normal  Medical History:   Past Medical History:  Diagnosis Date  . Arthritis    "thumbs, joints" (03/23/2017  . Basal cell carcinoma    "several; scattered over my face, hands, leg some cut off; some burned off"  . Blind left eye    a. Corneal transplant x3 with rejection  . CAD (coronary artery disease)    a. s/p NSTEMI 2015 treated conservatively; nuclear stress test low risk. b. Continued angina despite med rx 07/2015 - s/p io Freedom Stent to ramus intermedius with mod LAD stenosis neg by FFR.  . Complication of anesthesia    "very sensitive to RX"  . Diverticulitis large intestine   . Diverticulosis   . Family history of adverse reaction to anesthesia    "we all get PONV" (1/1/201)  . Gastritis   . GERD (gastroesophageal reflux disease)   . Hayfever   . Helicobacter pylori (H. pylori) 05/22/02   RUT-Positive  . Hypertension   . Myocardial infarction ([place]) 2015  . Squamous carcinoma    "several; scattered over my face, hands, leg some cut off; some burned off"  . Stroke [facility]) 2015   denies residual on 08/07/2015  . Stroke [facility]) 03/18/2017   "has effected her vision, balance, some memory" (03/23/2017)  . SVT (supraventricular tachycardia) (HCC)    a. seen by Dr. Caryl Comes - has loop recorder in. Patient has declined amiodarone  due to side effect profile  . Varicose veins        Family Med/Psych History:  Family History  Problem Relation Age of Onset  . Stroke Mother   . Heart attack Father   . Heart attack Brother   . Cancer Brother   . Heart attack Brother   . Heart attack Brother   . Heart attack Brother   . Parkinsonism Brother     Risk of Suicide/Violence: virtually non-existent   Impression/DX:  Forrest Demuro is a 84 year old female with a history of hypertension, SVT, CAD, Fuch's disease with left eye blindness and decreased vision in her right eye.  She has had issues with balance as well.  The patient was admitted on 12/27+2018 after sudden loss of vision in the right eye.  She also describes left facial numbness and elevated blood pressure.  CTA of the head/neck showed new short segment occlusion of right PCA.  While in the emergency department her vision improved to baseline.  MRI revealed right brainstem infarct.  Specifically, the infarct was in the right anterior medial midbrain with involvement of  the right cerebral penduncle.  There was also moderate chronic small vessel disease with moderate parenchymal brain volume loss.  The patient is continued to have balance deficits, left inattention, as well as cognitive deficits with problem solving a functional task and poor safety awareness.  During the clinical interview today the patient was present along with her daughter.  The patient has improved significantly over the past week.  Her vision, however, does continue to be impaired in her right eye and she has no vision in her left eye due to prior eye disease.  The patient does have some deficits with regard to problem solving and other executive functioning including difficulties with safety awareness issues.  The patient's memory has improved.  The patient denies any depression or anxiety but does report that her confusion and awareness issues caused a lot of stress initially.  The patient's daughter  reports that the patient did have some significant cognitive deficits initially and while these are improving they do persist.  There has been significant improvement in cognitive functioning and self awareness of safety issues.  Diagnosis:   Brainstem stroke        Electronically Signed   _______________________ Ilean Skill, Psy.D.

## 2017-03-30 NOTE — Progress Notes (Signed)
Physical Therapy Note  Patient Details  Name: [name] [name] MRN: [id number] Date of Birth: [birth year] Today's Date: 03/30/2017    Time: 830-857 27 minutes  1:1 No c/o pain.  BP 168/90, RN aware.  Pt performs gait with RW with supervision for path finding and cues for safety with RW.  Standing balance with ball toss with narrow and wide BOS, tandem and semi tandem stance.  pt requires min/mod A for tandem stance, min A for narrow and semi tandem stance, close supervision for wide BOS.  Pt left in room with needs at hand.   Lashe Oliveira 03/30/2017, 8:58 AM

## 2017-03-30 NOTE — Progress Notes (Signed)
Speech Language Pathology Daily Session Note  Patient Details  Name: [name] [name] MRN: [id number] Date of Birth: June 03, 1924  Today's Date: 03/30/2017 SLP Individual Time: 2334-3568 SLP Individual Time Calculation (min): 60 min  Short Term Goals: Week 1: SLP Short Term Goal 1 (Week 1): STGs=LTGs  Skilled Therapeutic Interventions: Skilled treatment session focused on cognitive and dysphagia goals. SLP facilitated session by providing supervision verbal cues for problem solving during basic self-care tasks in regards to decreased visual acuity. SLP also facilitated session by providing skilled observation with upgraded lunch tray of regular textures with thin liquids. Patient consumed meal with Mod I and without overt s/s of aspiration, therefore, recommend patient upgrade to regular textures. Patient left upright in recliner with all needs within reach. Continue with current plan of care.      Function:  Eating Eating   Modified Consistency Diet: Yes Eating Assist Level: Set up assist for;More than reasonable amount of time;Swallowing techniques: self managed   Eating Set Up Assist For: Opening containers       Cognition Comprehension Comprehension assist level: Understands complex 90% of the time/cues 10% of the time  Expression   Expression assist level: Expresses complex ideas: With extra time/assistive device  Social Interaction Social Interaction assist level: Interacts appropriately 90% of the time - Needs monitoring or encouragement for participation or interaction.  Problem Solving Problem solving assist level: Solves basic 90% of the time/requires cueing < 10% of the time  Memory Memory assist level: Recognizes or recalls 90% of the time/requires cueing < 10% of the time    Pain No/Denies Pain   Therapy/Group: Individual Therapy  [name] 03/30/2017, 2:25 PM

## 2017-03-30 NOTE — Progress Notes (Signed)
Occupational Therapy Session Note  Patient Details  Name: [name] [name] MRN: [id number] Date of Birth: [birth year]  Today's Date: 03/30/2017 OT Individual Time: 8921-1941 OT Individual Time Calculation (min): 71 min    Short Term Goals: Week 1:  OT Short Term Goal 1 (Week 1): STGs=LTGs secondary to estimated short LOS  Skilled Therapeutic Interventions/Progress Updates:    Upon entering the room, pt seated in recliner chair and reports feeling unwell. Pt holding stomach and reports, " I hope I don't throw it all up." OT assisted pt with transfer back into bed with min HHA and provided with ginger ale. OT educating pt on current progress and plans for discharge. Pt reports feeling better and needing to use bathroom. Bed mobility performed with supervision and pt ambulating with min HHA into bathroom and performed toileting with supervision for safety. Pt standing at sink for grooming tasks at S/set up level for pt to locate items secondary to vision deficits. Pt seated on EOB with clothing placed beside her on bed and dressed with overall close supervision but needing assistance to hook bra. Pt returned to supine secondary to fatigue. Bed alarm activated and call bell within reach upon exiting the room.   Therapy Documentation Precautions:  Precautions Precautions: Fall Precaution Comments: L eye blindness, and decreased vision R eye Restrictions Weight Bearing Restrictions: No Vital Signs: Therapy Vitals BP: (!) 168/90  See Function Navigator for Current Functional Status.   Therapy/Group: Individual Therapy  Gypsy Decant 03/30/2017, 10:44 AM

## 2017-03-31 ENCOUNTER — Inpatient Hospital Stay (HOSPITAL_COMMUNITY): Payer: PPO

## 2017-03-31 ENCOUNTER — Inpatient Hospital Stay (HOSPITAL_COMMUNITY): Payer: PPO | Admitting: Occupational Therapy

## 2017-03-31 ENCOUNTER — Inpatient Hospital Stay (HOSPITAL_COMMUNITY): Payer: PPO | Admitting: Speech Pathology

## 2017-03-31 ENCOUNTER — Inpatient Hospital Stay (HOSPITAL_COMMUNITY): Payer: PPO | Admitting: Physical Therapy

## 2017-03-31 LAB — CBC
HCT: 42.6 % (ref 36.0–46.0)
Hemoglobin: 13.4 g/dL (ref 12.0–15.0)
MCH: 30.9 pg (ref 26.0–34.0)
MCHC: 31.5 g/dL (ref 30.0–36.0)
MCV: 98.2 fL (ref 78.0–100.0)
PLATELETS: 181 10*3/uL (ref 150–400)
RBC: 4.34 MIL/uL (ref 3.87–5.11)
RDW: 13 % (ref 11.5–15.5)
WBC: 4.7 10*3/uL (ref 4.0–10.5)

## 2017-03-31 NOTE — Plan of Care (Signed)
  Progressing Consults RH STROKE PATIENT EDUCATION Description See Patient Education module for education specifics  03/31/2017 1545 - Progressing by Brita Romp, RN RH SKIN INTEGRITY RH STG SKIN FREE OF INFECTION/BREAKDOWN Description No new breakdown prior to discharge with min assist  03/31/2017 1545 - Progressing by Brita Romp, RN RH STG MAINTAIN SKIN INTEGRITY WITH ASSISTANCE Description STG Maintain Skin Integrity With  Mod I   03/31/2017 1545 - Progressing by Brita Romp, RN RH SAFETY RH STG ADHERE TO SAFETY PRECAUTIONS W/ASSISTANCE/DEVICE Description STG Adhere to Safety Precautions With mod I   03/31/2017 1545 - Progressing by Brita Romp, RN RH COGNITION-NURSING RH STG USES MEMORY AIDS/STRATEGIES W/ASSIST TO PROBLEM SOLVE Description STG Uses Memory Aids/Strategies With mod I  to Problem Solve.  03/31/2017 1545 - Progressing by Brita Romp, RN RH STG ANTICIPATES NEEDS/CALLS FOR ASSIST W/ASSIST/CUES Description STG Anticipates Needs/Calls for Assist With mod I  /Cues.  03/31/2017 1545 - Progressing by Brita Romp, RN RH PAIN MANAGEMENT RH STG PAIN MANAGED AT OR BELOW PT'S PAIN GOAL Description Less than 2   03/31/2017 1545 - Progressing by Brita Romp, RN RH KNOWLEDGE DEFICIT RH STG INCREASE KNOWLEDGE OF HYPERTENSION Description Patient and family will be able to verbalized how to manage HTN with medications and food choices  03/31/2017 1545 - Progressing by Brita Romp, RN

## 2017-03-31 NOTE — Progress Notes (Signed)
Occupational Therapy Note  Patient Details  Name: [name] [name] MRN: [id number] Date of Birth: June 12, 1924  Today's Date: 03/31/2017 OT Individual Time: 1000-1030 OT Individual Time Calculation (min): 30 min   Pt denies pain Individual Therapy  Pt resting in bed upon arrival and agreeable to therapy.  OT intervention with focus on discharge planning and functional amb with Rollator in room.  Pt performed supine>sit EOB and amb to recliner at supervision level with min verbal cues to locate objects in room.  Pt employs compensatory strategies but is hindered by decreased vision in R eye. Pt remained in recliner with all needs within reach.    Leotis Shames [facility] 03/31/2017, 12:13 PM

## 2017-03-31 NOTE — Patient Care Conference (Signed)
Inpatient RehabilitationTeam Conference and Plan of Care Update Date: 03/30/2017   Time: 2:40 PM    Patient Name: [name] [name]      Medical Record Number: [id number]  Date of Birth: 1924-11-04 Sex: Female         Room/Bed: 8B16X/4H03U-88 Payor Info: Payor: [employer] / Plan: [employer] Must / Product Type: *No Product type* /    Admitting Diagnosis: R CVA  Admit Date/Time:  03/24/2017  4:32 PM Admission Comments: No comment available   Primary Diagnosis:  <principal problem not specified> Principal Problem: <principal problem not specified>  Patient Active Problem List   Diagnosis Date Noted  . Gastroesophageal reflux disease   . Brainstem infarct, acute 03/24/2017  . Stroke-like symptoms   . Benign essential HTN   . History of supraventricular tachycardia   . Hypokalemia   . Hyponatremia   . Stroke ([place]) 03/18/2017  . Other forms of angina pectoris ([place]) 08/07/2015  . Pre-diabetes   . Blind left eye- (Fuch's disease) 03/05/2015  . Diverticulitis 12/19/2014  . UTI (lower urinary tract infection) 12/19/2014  . Essential hypertension 12/19/2014  . CAD (coronary artery disease) 12/19/2014  . Diverticulitis of large intestine without perforation or abscess without bleeding   . History of Rt brain stroke Oct 2015 06/14/2014  . Headache 06/14/2014  . Malignant hypertension 01/20/2014  . Hypertensive urgency, malignant 01/20/2014  . NSTEMI Nov 2015- conservative Rx 01/20/2014  . Chest pain with moderate risk of acute coronary syndrome 01/19/2014  . Aphasia 01/04/2014  . Paresthesia 01/04/2014  . Acute CVA (cerebrovascular accident) ([place]) 01/04/2014  . LLQ abdominal pain 01/01/2014  . Generalized abdominal pain 01/01/2014  . History of diverticulitis 01/01/2014  . Diverticulitis of colon without hemorrhage 12/07/2013  . Diaphoresis 12/29/2012  . Anemia 12/28/2012  . Sepsis ([place]) 12/24/2012  . Cellulitis of leg, right 12/24/2012  . Pyuria 12/24/2012  . Hypoxemia  12/24/2012  . Acute on chronic diastolic heart failure ([place]) 12/24/2012  . Postoperative anemia due to acute blood loss 12/15/2012  . Sleep disturbance 10/03/2012  . Abdominal pulsatile mass 08/24/2012  . Depression 07/12/2012  . OA (osteoarthritis) of knee 06/27/2012  . History of PSVT 12/15/2011    Expected Discharge Date: Expected Discharge Date: 04/03/17  Team Members Present: Physician leading conference: Dr. Alger Simons Social Worker Present: Lennart Pall, LCSW Nurse Present: Blair Heys, RN PT Present: Lavone Nian, PT;Other (comment)(Cindy Walton, PT) OT Present: Benay Pillow, OT SLP Present: Weston Anna, SLP PPS Coordinator present : Daiva Nakayama, RN, CRRN     Current Status/Progress Goal Weekly Team Focus  Medical   Right midbrain infarct with left hemiparesis and mild inattention.   Maintain appropriate blood pressure and nutritional status  Educate regarding stroke risk and manage post stroke sequelae   Bowel/Bladder   Continent of bladder and bowel, LBM 03/29/2017  Maintain continence  Assess qs and prn tolieting needs and address with assistance up to BR X1, Has prn orders for Laxative, po Myralax, Suppository and Fleet prn   Swallow/Nutrition/ Hydration   regular textures with thin liquids, Mod I  Mod I  Goals Met    ADL's   S for grooming task, min A overall with HHA for functional transfers and balance, min verbal cues for secondary to decreased vision  supervision overall  ADL retraining, balanace, endurance, activity tolerance, pt/family edu, vision strategies   Mobility   min A to S with RW, stairs min A, transfers S  S overall  stairs, compensation for visual deficits,  balance   Communication             Safety/Cognition/ Behavioral Observations  Supervision-Min A  Supervision   compensation for visual impairments, recall    Pain   PRN Tylenol 650 mg for c/o headache rating discomfort 5/10 on pain scale with relief  states none  Assess QS and prn  medicate with follow uo noted    Skin   Skin intact  Maintain skin integrity  Assess and document any changes in skin integrity QS and prn, notify MD/PA, no rashes or discomfort verbalize    Rehab Goals Patient on target to meet rehab goals: Yes *See Care Plan and progress notes for long and short-term goals.     Barriers to Discharge  Current Status/Progress Possible Resolutions Date Resolved   Physician    Medical stability        n/a      Nursing                  PT                    OT                  SLP                SW                Discharge Planning/Teaching Needs:  Home with family to provide/ arrange 24/7 supervision vs possible short term SNF  Teaching to be completed prior to d/c.   Team Discussion:  Pt with significant visual deficits.  Cognition very clear and pt with good safety awareness.  Supervision with toileting and supervision/ set-up for ADLs.  Min assist for stairs for safety. Pt prefers rollator.  Revisions to Treatment Plan:  None    Continued Need for Acute Rehabilitation Level of Care: The patient requires daily medical management by a physician with specialized training in physical medicine and rehabilitation for the following conditions: Daily direction of a multidisciplinary physical rehabilitation program to ensure safe treatment while eliciting the highest outcome that is of practical value to the patient.: Yes Daily medical management of patient stability for increased activity during participation in an intensive rehabilitation regime.: Yes Daily analysis of laboratory values and/or radiology reports with any subsequent need for medication adjustment of medical intervention for : Neurological problems  Kamari Bilek 03/31/2017, 2:52 PM

## 2017-03-31 NOTE — Progress Notes (Signed)
Physical Therapy Session Note  Patient Details  Name: [name] [name] MRN: [id number] Date of Birth: 08-Dec-1924  Today's Date: 03/31/2017 PT Individual Time: 9201-0071 PT Individual Time Calculation (min): 56 min   Short Term Goals: Week 1:  PT Short Term Goal 1 (Week 1): STG=LTG due to ELOS  Skilled Therapeutic Interventions/Progress Updates:  Pt received in room with daughters present but exiting shortly after PT arrival. Pt denied c/o pain but reports being upset regarding need for caregivers upon d/c and therapist provided encouragement. Session focused on stair negotiation, gait, transfers, and d/c planning. Pt reported need to use restroom and completed continent void on toilet with min guard<>supervision for balance; pt able to manage clothing and perform peri hygiene without assistance from therapist. Pt ambulated throughout unit with supervision<>min guard assist with rollator; pt required min guard assist on occasion and significant verbal cuing 2/2 vision deficits. Pt negotiated 12 steps (6") with R ascending rail to simulate steps to downstairs bedroom; pt required min guard progressing to supervision for stair negotiation. Pt completed car transfer at low sedan height with rollator and cuing for technique. Pt reports she has a walk in shower downstairs with ~5 inch step to enter. Educated pt on various DME for showering (chair vs transfer bench) with pt practicing transfer bench and reporting she would like to use this. At end of session pt left in bed in room with all needs within reach & bed alarm set.   Therapy Documentation Precautions:  Precautions Precautions: Fall Precaution Comments: L eye blindness, and decreased vision R eye Restrictions Weight Bearing Restrictions: No   See Function Navigator for Current Functional Status.   Therapy/Group: Individual Therapy  Waunita Schooner 03/31/2017, 4:48 PM

## 2017-03-31 NOTE — Progress Notes (Signed)
Speech Language Pathology Daily Session Note  Patient Details  Name: Troi Bechtold MRN: [id number] Date of Birth: 1924/04/28  Today's Date: 03/31/2017 SLP Individual Time: 1030-1100 SLP Individual Time Calculation (min): 30 min  Short Term Goals: Week 1: SLP Short Term Goal 1 (Week 1): STGs=LTGs  Skilled Therapeutic Interventions: Skilled treatment session focused on cognitive goals. Patient recalled her current medications and their functions with extra time and verbally problem solved strategies to improve safety with medication administration due to visual deficits with supervision verbal cues. Patient left upright in wheelchair with all needs within reach. Continue with current plan of care.      Function:   Cognition Comprehension Comprehension assist level: Understands complex 90% of the time/cues 10% of the time  Expression   Expression assist level: Expresses complex ideas: With extra time/assistive device  Social Interaction Social Interaction assist level: Interacts appropriately 90% of the time - Needs monitoring or encouragement for participation or interaction.  Problem Solving Problem solving assist level: Solves basic 90% of the time/requires cueing < 10% of the time  Memory Memory assist level: Recognizes or recalls 90% of the time/requires cueing < 10% of the time    Pain No/Denies Pain   Therapy/Group: Individual Therapy  Gareth Fitzner 03/31/2017, 3:28 PM

## 2017-03-31 NOTE — Progress Notes (Signed)
Occupational Therapy Session Note  Patient Details  Name: [name] [name] MRN: [id number] Date of Birth: 1924/05/01  Today's Date: 03/31/2017 OT Individual Time: 1300-1408 OT Individual Time Calculation (min): 68 min    Short Term Goals: Week 1:  OT Short Term Goal 1 (Week 1): STGs=LTGs secondary to estimated short LOS  Skilled Therapeutic Interventions/Progress Updates:    Upon entering the room, pt supine in bed awaiting therapist arrival. Pt with no c/o pain this session. Pt requesting to shower and wash hair this session. Pt ambulating 10' into bathroom with rollator with supervision and min cues for navigation secondary to vision. Pt performed toilet transfer with supervision. Pt required steady assistance for balance while removing clothing from seated position on commode chair. Pt transferred onto TTB for bathing at shower level. Pt required steady assistance when standing in shower with grab bar while washing buttocks and peri area. Pt ambulating to bed for dressing. Pt required cues to locate items on bed needed for task. Pt crossing ankle to opposite knee to thread LB clothing. Pt taking seated rest break after getting dressed secondary to fatigue. Pt remained seated on EOB with set up for lunch tray and family members entering the room. Call bell and all needed items within reach upon exiting the room.   Therapy Documentation Precautions:  Precautions Precautions: Fall Precaution Comments: L eye blindness, and decreased vision R eye Restrictions Weight Bearing Restrictions: No General:   Vital Signs: Therapy Vitals Temp: (couldn't get because she just ate ice cream) Pulse Rate: 73 Resp: 18 BP: (!) 150/66 Patient Position (if appropriate): Lying Oxygen Therapy SpO2: 100 % O2 Device: Not Delivered Pain:   ADL:   Vision   Perception    Praxis   Exercises:   Other Treatments:    See Function Navigator for Current Functional Status.   Therapy/Group: Individual  Therapy  Gypsy Decant 03/31/2017, 4:23 PM

## 2017-03-31 NOTE — Progress Notes (Signed)
[facility] PHYSICAL MEDICINE & REHABILITATION     PROGRESS NOTE    Subjective/Complaints: Had a reasonable night's.  Unhappy with her breakfast tray as she did not get some items she wanted and condiments are missing.  She states that she hates to complain  ROS: pt denies nausea, vomiting, diarrhea, cough, shortness of breath or chest pain   Objective: Vital Signs: Blood pressure (!) 161/78, pulse 76, temperature (!) 97.5 F (36.4 C), temperature source Oral, resp. rate 17, height 5\' 3"  (1.6 m), weight 80.2 kg (176 lb 12.9 oz), SpO2 97 %. No results found. Recent Labs    03/31/17 0532  WBC 4.7  HGB 13.4  HCT 42.6  PLT 181   No results for input(s): NA, K, CL, GLUCOSE, BUN, CREATININE, CALCIUM in the last 72 hours.  Invalid input(s): CO CBG (last 3)  No results for input(s): GLUCAP in the last 72 hours.  Wt Readings from Last 3 Encounters:  03/25/17 80.2 kg (176 lb 12.9 oz)  10/01/16 77.6 kg (171 lb)  04/29/16 77 kg (169 lb 12.8 oz)    Physical Exam:  Constitutional: She is oriented to person, place, and time. She appears well-developed and well-nourished.    No distress HENT:  Head: Normocephalic and atraumatic.  Eyes: Left eye opaque Neck: Normal range of motion. Neck supple.  Cardiovascular: RRR without murmur. No JVD    Respiratory: CTA Bilaterally without wheezes or rales. Normal effort  GI: BS +, non-tender, non-distended   Musculoskeletal: She exhibits no edema or tenderness.  Neurological: She is alert and oriented to person, place, and time.  Speech clear and fairly appropriate Left eye opaque with ptosis--no vision through eye--  Left inattention.  And likely left hemianopsia, decreased visual acuity through OD .    May have partial cranial nerve III palsy limited limited eye adduction on the right.  No substantial visual changes noted  Skin: Skin is warm and dry. No erythema.  Psychiatric: She has a normal mood and affect. Her speech is tangential. She  expresses impulsivity. She is inattentive.     Assessment/Plan: 1. Balance and functional deficits secondary to right midbrain infarct which require 3+ hours per day of interdisciplinary therapy in a comprehensive inpatient rehab setting. Physiatrist is providing close team supervision and 24 hour management of active medical problems listed below. Physiatrist and rehab team continue to assess barriers to discharge/monitor patient progress toward functional and medical goals.  Function:  Bathing Bathing position   Position: Shower  Bathing parts Body parts bathed by patient: Right arm, Left arm, Chest, Abdomen, Front perineal area, Buttocks, Right upper leg, Left upper leg, Right lower leg, Left lower leg Body parts bathed by helper: Back  Bathing assist Assist Level: Touching or steadying assistance(Pt > 75%)      Upper Body Dressing/Undressing Upper body dressing   What is the patient wearing?: Bra, Button up shirt Bra - Perfomed by patient: Thread/unthread right bra strap, Thread/unthread left bra strap Bra - Perfomed by helper: Hook/unhook bra (pull down sports bra)     Button up shirt - Perfomed by patient: Thread/unthread right sleeve, Thread/unthread left sleeve, Pull shirt around back, Button/unbutton shirt      Upper body assist Assist Level: Touching or steadying assistance(Pt > 75%)      Lower Body Dressing/Undressing Lower body dressing   What is the patient wearing?: Pants, Socks, Shoes, Underwear Underwear - Performed by patient: Thread/unthread right underwear leg, Thread/unthread left underwear leg, Pull underwear up/down  Pants- Performed by patient: Thread/unthread right pants leg, Thread/unthread left pants leg, Pull pants up/down, Fasten/unfasten pants       Socks - Performed by patient: Don/doff right sock, Don/doff left sock   Shoes - Performed by patient: Don/doff right shoe, Don/doff left shoe, Fasten right, Fasten left            Lower body  assist Assist for lower body dressing: Supervision or verbal cues, Set up   Set up : To obtain clothing/put away  Toileting Toileting   Toileting steps completed by patient: Adjust clothing prior to toileting, Performs perineal hygiene, Adjust clothing after toileting Toileting steps completed by helper: Adjust clothing prior to toileting, Adjust clothing after toileting Toileting Assistive Devices: Grab bar or rail  Toileting assist Assist level: Supervision or verbal cues   Transfers Chair/bed transfer   Chair/bed transfer method: Ambulatory Chair/bed transfer assist level: Touching or steadying assistance (Pt > 75%) Chair/bed transfer assistive device: Armrests     Locomotion Ambulation     Max distance: 125 ft  Assist level: Supervision or verbal cues   Wheelchair          Cognition Comprehension Comprehension assist level: Understands complex 90% of the time/cues 10% of the time  Expression Expression assist level: Expresses complex ideas: With extra time/assistive device  Social Interaction Social Interaction assist level: Interacts appropriately 90% of the time - Needs monitoring or encouragement for participation or interaction.  Problem Solving Problem solving assist level: Solves basic 90% of the time/requires cueing < 10% of the time  Memory Memory assist level: Recognizes or recalls 90% of the time/requires cueing < 10% of the time   Medical Problem List and Plan: 1.  Balance deficits, left inattention, cognitive deficits with deficits in problem solving functional tasks as well as poor safety awareness secondary to right anteromedial midbrain infarct.       -Continue PT OT speech. Team conf today   -Reviewed needs for supervision once home.  Patient seems to have an understanding of such  2.  DVT Prophylaxis/Anticoagulation: Pharmaceutical: Lovenox bleeding 3. Chronic right hip pain/Headaches/Pain Management:  Tylenol prn.  Headaches gradually improving.  4.  Mood: LCSW to follow for evaluation and support.  5. Neuropsych: This patient is not fully capable of making decisions on her own behalf. 6. Skin/Wound Care: routine pressure relief measures 7. Fluids/Electrolytes/Nutrition: Monitor I/O.  Good appetite.      -Continue to encourage p.o. intake 8. HTN: Monitor blood pressure bid.  Still with some lability continue to monitor On Amlodipine and Bystolic at home, Bystolic resumed due to gradual elevation of pressures Vitals:   03/30/17 2148 03/31/17 0452  BP: (!) 147/72 (!) 161/78  Pulse: 78 76  Resp:  17  Temp:  (!) 97.5 F (36.4 C)  SpO2: 98% 97%    -may need further titration of regimen at some point but not now 9. Hyponatremia: Resolved 10.  Dyslipidemia: on Lipitor.  11. H/o SVTs: Asymptomatic. To follow up with Dr. Burt Knack after discharge.   12. GERD: managed with pepcid.  See no concerning GI signs on exam today 13.  Insomnia, trial of melatonin--continue  LOS (Days) 7 A FACE TO FACE EVALUATION WAS PERFORMED  Meredith Staggers, MD 03/31/2017 8:55 AM

## 2017-04-01 ENCOUNTER — Inpatient Hospital Stay (HOSPITAL_COMMUNITY): Payer: PPO | Admitting: *Deleted

## 2017-04-01 ENCOUNTER — Inpatient Hospital Stay (HOSPITAL_COMMUNITY): Payer: PPO | Admitting: Physical Therapy

## 2017-04-01 ENCOUNTER — Inpatient Hospital Stay (HOSPITAL_COMMUNITY): Payer: PPO | Admitting: Speech Pathology

## 2017-04-01 ENCOUNTER — Inpatient Hospital Stay (HOSPITAL_COMMUNITY): Payer: PPO | Admitting: Occupational Therapy

## 2017-04-01 MED ORDER — AMLODIPINE BESYLATE 5 MG PO TABS: 5.0000 mg | ORAL_TABLET | Freq: Every day | ORAL | Status: DC

## 2017-04-01 MED ORDER — AMLODIPINE BESYLATE 5 MG PO TABS
5.0000 mg | ORAL_TABLET | Freq: Every day | ORAL | Status: DC
  Filled 2017-04-01: qty 1

## 2017-04-01 MED ORDER — AMLODIPINE BESYLATE 5 MG PO TABS
5.0000 mg | ORAL_TABLET | Freq: Every day | ORAL | Status: DC
  Administered 2017-04-01 – 2017-04-02 (×2): 5 mg via ORAL
  Filled 2017-04-01: qty 1

## 2017-04-01 NOTE — Progress Notes (Signed)
Speech Language Pathology Daily Session Note  Patient Details  Name: Sumayyah Custodio MRN: [id number] Date of Birth: 06/27/1924  Today's Date: 04/01/2017 SLP Individual Time: 1430-1500 SLP Individual Time Calculation (min): 30 min  Short Term Goals: Week 1: SLP Short Term Goal 1 (Week 1): STGs=LTGs  Skilled Therapeutic Interventions: Skilled treatment session focused on family education with the patient's daughter in regards to strategies to utilize to maximize recall and safety 2/2 visual deficits at discharge. She verbalized understanding of all information and all questions answered at this time. Patient left upright in chair with daughter present. Continue with current plan of care.      Function:   Cognition Comprehension Comprehension assist level: Understands complex 90% of the time/cues 10% of the time  Expression   Expression assist level: Expresses complex ideas: With extra time/assistive device  Social Interaction Social Interaction assist level: Interacts appropriately 90% of the time - Needs monitoring or encouragement for participation or interaction.  Problem Solving Problem solving assist level: Solves basic 90% of the time/requires cueing < 10% of the time  Memory Memory assist level: Recognizes or recalls 90% of the time/requires cueing < 10% of the time    Pain No/Denies Pain   Therapy/Group: Individual Therapy  Susumu Hackler 04/01/2017, 3:32 PM

## 2017-04-01 NOTE — Progress Notes (Signed)
Physical Therapy Session Note  Patient Details  Name: [name] [name] MRN: [id number] Date of Birth: 08/15/1924  Today's Date: 04/01/2017 PT Individual Time: 1035-1101 and 4098-1191 PT Individual Time Calculation (min): 26 min and 60 min  Short Term Goals: Week 1:  PT Short Term Goal 1 (Week 1): STG=LTG due to ELOS  Skilled Therapeutic Interventions/Progress Updates:  Treatment 1: Pt received in room & agreeable to tx. Pt without c/o pain. Session focused on stair negotiation and management of rollator. Pt ambulates room<>gym with rollator and supervision requiring occasional cuing for obstacle avoidance 2/2 impaired vision. Therapist provided instructional cuing for proper placement and safety when transferring sit<>stand on rollator with pt return demonstrating multiple times. Pt is unable to correctly recall how to lock/unlock brakes on rollator but is able to figure this out with trial and error without assistance from therapist. Pt negotiated 8 steps with R ascending rail to simulate home environment with supervision only. At end of session pt left in bed with alarm set & all needs within reach.   Treatment 2: Pt received in room & agreeable to tx, denying c/o pain. Pt transferred to EOB & donned tennis shoes without assistance. Pt reported need to use restroom and ambulated within room & bathroom with rollator and supervision. Pt with continent void and performed peri hygiene without assistance. Pt ambulated room<>gym with rollator and supervision. Pt able to manage rollator brakes with trial and error as she is unable to recall positioning for lock/unlock. Pt negotiated obstacle course with supervision (weaving between cones and ambulating over uneven surface); therapist provided cuing and instruction for need to slow down and take her time during functional mobility 2/2 impaired vision and use of new AD. Pt completed floor transfer with min guard assistance; educated pt on instances when she  should call EMS if she were to experience a fall at home. Pt engaged in game of horseshoes while standing on airex foam with min assist for balance. Pt returned to room and therapist assisted her with setting up meal tray. Pt left sitting in recliner with all needs within reach.  Educated pt on home modifications (remove or secure throw rugs and cords on floor) to reduce fall risks.   Therapy Documentation Precautions:  Precautions Precautions: Fall Precaution Comments: L eye blindness, and decreased vision R eye Restrictions Weight Bearing Restrictions: No   See Function Navigator for Current Functional Status.   Therapy/Group: Individual Therapy  Waunita Schooner 04/01/2017, 4:47 PM

## 2017-04-01 NOTE — Progress Notes (Signed)
Occupational Therapy Session Note  Patient Details  Name: [name] [name] MRN: [id number] Date of Birth: [birth year]  Today's Date: 04/01/2017 OT Individual Time: 0867-6195 OT Individual Time Calculation (min): 73 min    Short Term Goals: Week 1:  OT Short Term Goal 1 (Week 1): STGs=LTGs secondary to estimated short LOS  Skilled Therapeutic Interventions/Progress Updates:    Upon entering the room, pt seated on EOB awaiting therapist. Pt requesting to use bathroom. Pt showing good safety awareness with rollator. Pt ambulating 10' to bathroom with rollator with supervision. Pt able to void and performed transfer, hygiene, and clothing management at overall supervision level. Pt performed UB bathing and grooming tasks while standing at sink for over 16 minutes with supervision overall for task with min cues to locate items secondary to visual deficits. Pt washing LB with sit <>stand as needed with overall supervision. Pt returned to sit on EOB secondary to fatigue. OT continues education on energy conservation and home set up for safety secondary to current deficits. Pt engaged in conversation and asked questions as needed. Pt returning to supine to rest at the end of session. Call bell and all needed items within reach upon exiting the room.   Therapy Documentation Precautions:  Precautions Precautions: Fall Precaution Comments: L eye blindness, and decreased vision R eye Restrictions Weight Bearing Restrictions: No General:   Vital Signs: Therapy Vitals BP: (!) 147/62 Patient Position (if appropriate): Lying Pain: Pain Assessment Pain Score: 0-No pain  See Function Navigator for Current Functional Status.   Therapy/Group: Individual Therapy  Gypsy Decant 04/01/2017, 12:33 PM

## 2017-04-01 NOTE — Progress Notes (Signed)
[place] PHYSICAL MEDICINE & REHABILITATION     PROGRESS NOTE    Subjective/Complaints: Up at edge of bed eating breakfast.  No new problems overnight  ROS: pt denies nausea, vomiting, diarrhea, cough, shortness of breath or chest pain   Objective: Vital Signs: Blood pressure (!) 173/66, pulse 77, temperature 97.9 F (36.6 C), temperature source Oral, resp. rate 17, height 5\' 3"  (1.6 m), weight 80.2 kg (176 lb 12.9 oz), SpO2 95 %. No results found. Recent Labs    03/31/17 0532  WBC 4.7  HGB 13.4  HCT 42.6  PLT 181   No results for input(s): NA, K, CL, GLUCOSE, BUN, CREATININE, CALCIUM in the last 72 hours.  Invalid input(s): CO CBG (last 3)  No results for input(s): GLUCAP in the last 72 hours.  Wt Readings from Last 3 Encounters:  03/25/17 80.2 kg (176 lb 12.9 oz)  10/01/16 77.6 kg (171 lb)  04/29/16 77 kg (169 lb 12.8 oz)    Physical Exam:  Constitutional: She is oriented to person, place, and time. She appears well-developed and well-nourished.    No distress HENT:  Head: Normocephalic and atraumatic.  Eyes: Left eye opaque Neck: Normal range of motion. Neck supple.  Cardiovascular: RRR without murmur. No JVD     Respiratory: CTA Bilaterally without wheezes or rales. Normal effort   GI: BS +, non-tender, non-distended   Musculoskeletal: She exhibits no edema or tenderness.  Neurological: She is alert and oriented to person, place, and time.  Speech clear and fairly appropriate Left eye opaque with ptosis--no vision through eye--  Left inattention.  And likely left hemianopsia, decreased visual acuity through OD .    May have partial cranial nerve III palsy limited limited eye adduction on the right.  No substantial visual changes noted  Skin: Skin is warm and dry. No erythema.  Psychiatric: She has a normal mood and affect. Her speech is tangential. She expresses impulsivity. She is inattentive.     Assessment/Plan: 1. Balance and functional deficits  secondary to right midbrain infarct which require 3+ hours per day of interdisciplinary therapy in a comprehensive inpatient rehab setting. Physiatrist is providing close team supervision and 24 hour management of active medical problems listed below. Physiatrist and rehab team continue to assess barriers to discharge/monitor patient progress toward functional and medical goals.  Function:  Bathing Bathing position   Position: Shower  Bathing parts Body parts bathed by patient: Right arm, Left arm, Chest, Abdomen, Front perineal area, Buttocks, Right upper leg, Left upper leg, Right lower leg, Left lower leg Body parts bathed by helper: Back  Bathing assist Assist Level: Supervision or verbal cues      Upper Body Dressing/Undressing Upper body dressing   What is the patient wearing?: Bra, Button up shirt Bra - Perfomed by patient: Thread/unthread right bra strap, Thread/unthread left bra strap Bra - Perfomed by helper: Hook/unhook bra (pull down sports bra)     Button up shirt - Perfomed by patient: Thread/unthread right sleeve, Thread/unthread left sleeve, Pull shirt around back, Button/unbutton shirt      Upper body assist Assist Level: Touching or steadying assistance(Pt > 75%)      Lower Body Dressing/Undressing Lower body dressing   What is the patient wearing?: Pants, Socks, Shoes, Underwear Underwear - Performed by patient: Thread/unthread right underwear leg, Thread/unthread left underwear leg, Pull underwear up/down   Pants- Performed by patient: Thread/unthread right pants leg, Thread/unthread left pants leg, Pull pants up/down, Fasten/unfasten pants  Socks - Performed by patient: Don/doff right sock, Don/doff left sock   Shoes - Performed by patient: Don/doff right shoe, Don/doff left shoe, Fasten right, Fasten left            Lower body assist Assist for lower body dressing: Supervision or verbal cues, Set up   Set up : To obtain clothing/put away   Toileting Toileting   Toileting steps completed by patient: Adjust clothing prior to toileting, Performs perineal hygiene, Adjust clothing after toileting Toileting steps completed by helper: Adjust clothing prior to toileting, Adjust clothing after toileting Toileting Assistive Devices: Grab bar or rail  Toileting assist Assist level: Touching or steadying assistance (Pt.75%)   Transfers Chair/bed transfer   Chair/bed transfer method: Ambulatory Chair/bed transfer assist level: Touching or steadying assistance (Pt > 75%) Chair/bed transfer assistive device: Armrests     Locomotion Ambulation     Max distance: 150 ft Assist level: Touching or steadying assistance (Pt > 75%)   Wheelchair          Cognition Comprehension Comprehension assist level: Understands complex 90% of the time/cues 10% of the time  Expression Expression assist level: Expresses complex ideas: With extra time/assistive device  Social Interaction Social Interaction assist level: Interacts appropriately 90% of the time - Needs monitoring or encouragement for participation or interaction.  Problem Solving Problem solving assist level: Solves basic 90% of the time/requires cueing < 10% of the time  Memory Memory assist level: Recognizes or recalls 90% of the time/requires cueing < 10% of the time   Medical Problem List and Plan: 1.  Balance deficits, left inattention, cognitive deficits with deficits in problem solving functional tasks as well as poor safety awareness secondary to right anteromedial midbrain infarct.       -Continue PT OT speech.    -Working towards Saturday discharge  2.  DVT Prophylaxis/Anticoagulation: Pharmaceutical: Lovenox bleeding 3. Chronic right hip pain/Headaches/Pain Management:  Tylenol prn.  Headaches gradually improving.  4. Mood: LCSW to follow for evaluation and support.  5. Neuropsych: This patient is not fully capable of making decisions on her own behalf. 6. Skin/Wound  Care: routine pressure relief measures 7. Fluids/Electrolytes/Nutrition: Monitor I/O.  Good appetite.      -Continue to encourage p.o. intake 8. HTN: Monitor blood pressure bid.  Still with some lability continue to monitor On Amlodipine and Bystolic at home, Bystolic resumed due to gradual elevation of pressures Vitals:   03/31/17 2200 04/01/17 0500  BP: (!) 158/72 (!) 173/66  Pulse: 70 77  Resp:  17  Temp:  97.9 F (36.6 C)  SpO2: 98% 95%    -Resume amlodipine at 5 mg daily  10.  Dyslipidemia: on Lipitor.  11. H/o SVTs: Asymptomatic. To follow up with Dr. Burt Knack after discharge.   12. GERD: managed with pepcid.  See no concerning GI signs on exam today 13.  Insomnia, trial of melatonin--some improvement  LOS (Days) 8 A FACE TO FACE EVALUATION WAS PERFORMED  Meredith Staggers, MD 04/01/2017 9:15 AM

## 2017-04-01 NOTE — Discharge Summary (Signed)
Physician Discharge Summary  Patient ID: [name] [name] MRN: [id number] DOB/AGE: 1924-07-02 84 y.o.  Admit date: 03/24/2017 Discharge date: 04/03/2017  Discharge Diagnoses:  Principal Problem:   Brainstem infarct, acute Active Problems:   Sleep disturbance   Essential hypertension   Blind left eye- (Fuch's disease)   Gastroesophageal reflux disease   Legally blind in right eye, as defined in [place]   Visual field cut- right   Discharged Condition: stable   Significant Diagnostic Studies: N/A   Labs:  Basic Metabolic Panel: BMP Latest Ref Rng & Units 03/25/2017 03/23/2017 03/21/2017  Glucose 65 - 99 mg/dL 96 91 95  BUN 6 - 20 mg/dL 13 15 17   Creatinine 0.44 - 1.00 mg/dL 0.61 0.66 0.79  Sodium 135 - 145 mmol/L 138 137 133(L)  Potassium 3.5 - 5.1 mmol/L 4.3 3.7 3.8  Chloride 101 - 111 mmol/L 103 100(L) 100(L)  CO2 22 - 32 mmol/L 30 29 25   Calcium 8.9 - 10.3 mg/dL 8.8(L) 9.0 8.8(L)    CBC: CBC Latest Ref Rng & Units 03/31/2017 03/25/2017 03/19/2017  WBC 4.0 - 10.5 K/uL 4.7 4.2 5.5  Hemoglobin 12.0 - 15.0 g/dL 13.4 13.6 14.3  Hematocrit 36.0 - 46.0 % 42.6 42.2 43.2  Platelets 150 - 400 K/uL 181 170 170    CBG: No results for input(s): GLUCAP in the last 168 hours.  Brief HPI:   [name] [name] a 84 y.o.femalewith history of HTN, SVT, CAD, Fuch's disease with left eye blindness and decreased vision right eye, blance deficits; who was admitted on 03/18/17 with sudden loss of vision in right eye, left facial numbness and elevated BP.CTA head/neck showed new short segment occlusion of R-PCA at origin with new severe distal left P2 stenosis. Vision improved to baseline and tPA not given. MRI brainreviewed, showing right brainstem infarct.  Dr. Leonie Man recommended DAPT for stroke due to SVD v/s HTN emergency. Loop recorder interrogated-- no episodic a fib but has had frequent intermittent SVTs.  Patient with resultant  balance deficits, left inattention, cognitive deficits with deficits in  problem solving functional tasks as well as poor safety awareness. CIR was recommended due to functional deficits.    Hospital Course: [name] [name] was admitted to rehab 03/24/2017 for inpatient therapies to consist of PT, ST and OT at least three hours five days a week. Past admission physiatrist, therapy team and rehab RN have worked together to provide customized collaborative inpatient rehab. She is tolerating DAPT and follow up CBC shows H/H and platelets to be stable. Her blood pressures were monitored on bid basis and continued to show lability. Norvasc and Bystolic were gradually resumed with improvement.  Heart rate has been stable with increase in activity. Po intake is good and she is continent of bowel and bladder.  Insomnia has improved with addition of melatonin. Atorvastatin was discontinued due to family reports of myalgias with statins in the past.  She has made good progress and supervision is recommended due to visual deficits. She will continue to receive follow up HHPT and  [place] [facility] after discharge.      Rehab course: During patient's stay in rehab weekly team conferences were held to monitor patient's progress, set goals and discuss barriers to discharge. At admission patient required min assist with basic self care tasks and mobility. She demonstrated mild cognitive deficits affecting recall and problem solving with basic functional tasks.  She has had improvement in activity tolerance, balance, postural control, as well as ability to compensate for  deficits.  She is able to complete ADL tasks with supervision. She requires supervision with transfers and supervision to steady assist to ambulate 200' with rollator.  She requires overall supervisory cues to complete functional and familiar tasks.  Family education was completed regarding all aspects of care and safety.    Disposition:  Home   Diet: Heart Healthy.   Special Instructions: 1. Needs 24 hours  supervision for safety.     Discharge Instructions    AMB Referral to [facility]   Complete by:  As directed    Please assign [employer] member to [place] for transition of care. Referral received from [facility]TA. High risk for readmission. Discharging from inpatient rehab unit on 04/03/17. She will have home health services as well. [facility] Care Management consent obtained. Daughter- [name]- [phone] to be contacted for post discharge calls. Please assess if [facility] LCSW is needed at later time. May need to look into ALF, PACE, or caregivers at later time. Daughter denied need for [facility] LCSW referral upon bedside visit. Call with questions. Thanks .Marthenia Rolling, [place], RN,BSN-THN [facility] BOFBPZW-[phone]   Reason for consult:  Please assign to [facility] RNCM   Expected date of contact:  1-3 days (reserved for hospital discharges)   Ambulatory referral to Physical Medicine Rehab   Complete by:  As directed    1-2 weeks transitional care appt     Allergies as of 04/03/2017      Reactions   Contrast Media [iodinated Diagnostic Agents] Anaphylaxis   Fluorescein Anaphylaxis, Shortness Of Breath, Itching, Swelling   Hydralazine Hcl Anaphylaxis, Swelling, Palpitations, Rash   Sulfa Antibiotics Rash   Ultram [tramadol] Hives   Yellow Dye Anaphylaxis, Itching, Swelling, Other (See Comments)   Fluorescein (in eye drops)   Buprenex [buprenorphine Hcl] Palpitations, Other (See Comments)   Keflex [cephalexin] Diarrhea   Lipitor [atorvastatin] Other (See Comments)   Muscle weakness   Augmentin [amoxicillin-pot Clavulanate] Nausea And Vomiting   Levofloxacin Other (See Comments)   Reaction not recalled   Percocet [oxycodone-acetaminophen] Nausea And Vomiting   Tape Other (See Comments)   Adhesive tape pulls off the skin   Zetia [ezetimibe] Other (See Comments)   Myalgias      Medication List    STOP taking these medications   atorvastatin 40  MG tablet Commonly known as:  LIPITOR   hyoscyamine 0.125 MG tablet Commonly known as:  LEVSIN, ANASPAZ     TAKE these medications   amLODipine 5 MG tablet Commonly known as:  NORVASC Take 1 tablet (5 mg total) by mouth daily with supper. What changed:    medication strength  how much to take  when to take this  reasons to take this  additional instructions   aspirin 81 MG EC tablet Take 1 tablet (81 mg total) by mouth daily.   clopidogrel 75 MG tablet Commonly known as:  PLAVIX Take 1 tablet (75 mg total) by mouth daily. What changed:    how much to take  how to take this  when to take this   Magnesium 250 MG Tabs Take 250 mg by mouth at bedtime.   Melatonin 1 MG Subl Place 1 mg under the tongue at bedtime.   MULTI VITAMIN DAILY PO Take 1 tablet by mouth every morning.   MUSCLE RUB 10-15 % Crea Apply 1 application topically 3 (three) times daily as needed for muscle pain.   nebivolol 2.5 MG tablet Commonly known as:  BYSTOLIC  Take 1 tablet (2.5 mg total) by mouth daily. What changed:    medication strength  how much to take  how to take this  when to take this  additional instructions   nitroGLYCERIN 0.4 MG SL tablet Commonly known as:  NITROSTAT Place 1 tablet (0.4 mg total) under the tongue every 5 (five) minutes as needed for chest pain.   prednisoLONE acetate 1 % ophthalmic suspension Commonly known as:  PRED FORTE Place 1 drop into both eyes See admin instructions. 1 drop into both eyes at bedtime spaced 5 minutes apart from Systane gel   PRESERVISION AREDS 2 Caps Take 1 capsule by mouth daily after breakfast.   ranitidine 150 MG tablet Commonly known as:  ZANTAC Take 1 tablet (150 mg total) by mouth at bedtime.   SYSTANE ULTRA 0.4-0.3 % Soln Generic drug:  Polyethyl Glycol-Propyl Glycol Apply 1 drop to eye 2 (two) times daily as needed (for irritation).   SYSTANE 0.4-0.3 % Gel ophthalmic gel Generic drug:  Polyethyl  Glycol-Propyl Glycol Place 1 application into both eyes at bedtime.      Follow-up Information    Meredith Staggers, MD Follow up.   Specialty:  Physical Medicine and Rehabilitation Why:  office will call you with follow up appointment Contact information: [address] [place] [address] [phone]        Kathrynn Ducking, MD. Call.   Specialty:  Neurology Why:  for follow up appointment in 4 weeks Contact information: [address] [place] [address] [phone]        [name], MD. Call.   Specialty:  Cardiology Why:  for follow up appointment Contact information: [address] [place] [address] [phone]        Darcus Austin, MD Follow up on 04/08/2017.   Specialty:  Family Medicine Why:  @ 2:00 pm (hospital follow up appt) Contact information: [place] [address] [phone]           Signed: Bary Leriche 04/05/2017, 10:07 AM

## 2017-04-01 NOTE — Progress Notes (Signed)
Social Work Patient ID: [name] [name], female   DOB: [birth year], 84 y.o.   MRN: [id number]  Met with patient and family at length yesterday to discuss discharge plans.  All are aware and agreed with targeted discharge date from team conference of 02/01/18.  They are aware that 24/7 supervision is recommended due to patient's visual deficits.  Answered questions from all about various support systems in the home including private duty care, family support and adult day programs.  They did ask about option of going to SNF and I attempted to explain that, with patient functioning at a supervision level, I would not anticipate that insurance would cover SNF but could pursue that if they wanted me to do so.  After discussion, decision made today that they want to proceed with discharge home.   They have engaged with a local private duty care agency who has assessed patient earlier today.  I will provide them with other community resource information that they can look into further after discharge (i.e. adult day programs, assisted living facilities, and respite programs).  Will arrange [facility] services and order DME.  Kymber Kosar, LCSW

## 2017-04-01 NOTE — Progress Notes (Signed)
  Recreational Therapy Discharge Summary Patient Details  Name: [name] [name] MRN: [id number] Date of Birth: September 02, 1924 Today's Date: 04/01/2017   Session Note: Pain:  No c/o  Session focused on discharge planning, community reintegration with specific emphasis on energy conservation techniques and compensatory strategies for decreased vision.  Pt expressed need for toileting.  Pt ambulated with rollator to the bathroom with supervision, min cues for safety due to low vision.  Pt completed toileting with supervision.  Goals met.  Goals:  Pt will identify opportunities for energy conservation with min questioning cues. Pt will list 2 ways to compensate for low vision with min questioning cues  Discharge Summary:  Comments on progress toward goals: Pt has made good progress during LOS and is ready for discharge home with family to provide supervision.  TR goals focused on safe community mobility, energy conservation, compensatory strategies for low vision, and activity analysis with potential modifications.  All goals met.  Reasons for discharge: discharge from hospital  Patient/family agrees with progress made and goals achieved: Yes  [name] 04/01/2017, 11:43 AM

## 2017-04-01 NOTE — Patient Care Conference (Signed)
Inpatient RehabilitationTeam Conference and Plan of Care Update Date: 03/30/2017   Time: 2:40 PM    Patient Name: [name] [name]      Medical Record Number: [id number]  Date of Birth: [birth year] Sex: Female         Room/Bed: 7Q25Z/5G38V-56 Payor Info: Payor: [employer] / Plan: [employer] / Product Type: *No Product type* /    Admitting Diagnosis: R CVA  Admit Date/Time:  03/24/2017  4:32 PM Admission Comments: No comment available   Primary Diagnosis:  <principal problem not specified> Principal Problem: <principal problem not specified>  Patient Active Problem List   Diagnosis Date Noted  . Gastroesophageal reflux disease   . Brainstem infarct, acute 03/24/2017  . Stroke-like symptoms   . Benign essential HTN   . History of supraventricular tachycardia   . Hypokalemia   . Hyponatremia   . Stroke ([place]) 03/18/2017  . Other forms of angina pectoris ([place]) 08/07/2015  . Pre-diabetes   . Blind left eye- (Fuch's disease) 03/05/2015  . Diverticulitis 12/19/2014  . UTI (lower urinary tract infection) 12/19/2014  . Essential hypertension 12/19/2014  . CAD (coronary artery disease) 12/19/2014  . Diverticulitis of large intestine without perforation or abscess without bleeding   . History of Rt brain stroke Oct 2015 06/14/2014  . Headache 06/14/2014  . Malignant hypertension 01/20/2014  . Hypertensive urgency, malignant 01/20/2014  . NSTEMI Nov 2015- conservative Rx 01/20/2014  . Chest pain with moderate risk of acute coronary syndrome 01/19/2014  . Aphasia 01/04/2014  . Paresthesia 01/04/2014  . Acute CVA (cerebrovascular accident) ([place]) 01/04/2014  . LLQ abdominal pain 01/01/2014  . Generalized abdominal pain 01/01/2014  . History of diverticulitis 01/01/2014  . Diverticulitis of colon without hemorrhage 12/07/2013  . Diaphoresis 12/29/2012  . Anemia 12/28/2012  . Sepsis ([place]) 12/24/2012  . Cellulitis of leg, right 12/24/2012  . Pyuria 12/24/2012  . Hypoxemia  12/24/2012  . Acute on chronic diastolic heart failure ([place]) 12/24/2012  . Postoperative anemia due to acute blood loss 12/15/2012  . Sleep disturbance 10/03/2012  . Abdominal pulsatile mass 08/24/2012  . Depression 07/12/2012  . OA (osteoarthritis) of knee 06/27/2012  . History of PSVT 12/15/2011    Expected Discharge Date: Expected Discharge Date: 04/03/17  Team Members Present: Physician leading conference: Dr. Alger Simons Social Worker Present: Lennart Pall, LCSW Nurse Present: Blair Heys, RN PT Present: Lavone Nian, PT;Other (comment)(Cindy Savannah, PT) OT Present: Benay Pillow, OT SLP Present: Weston Anna, SLP PPS Coordinator present : Daiva Nakayama, RN, CRRN     Current Status/Progress Goal Weekly Team Focus  Medical   Right midbrain infarct with left hemiparesis and mild inattention.   Maintain appropriate blood pressure and nutritional status  Educate regarding stroke risk and manage post stroke sequelae   Bowel/Bladder   Continent of bladder and bowel, LBM 03/29/2017  Maintain continence  Assess qs and prn tolieting needs and address with assistance up to BR X1, Has prn orders for Laxative, po Myralax, Suppository and Fleet prn   Swallow/Nutrition/ Hydration   regular textures with thin liquids, Mod I  Mod I  Goals Met    ADL's   S for grooming task, min A overall with HHA for functional transfers and balance, min verbal cues for secondary to decreased vision  supervision overall  ADL retraining, balanace, endurance, activity tolerance, pt/family edu, vision strategies   Mobility   min A to S with RW, stairs min A, transfers S  S overall  stairs, compensation for visual deficits,  balance   Communication             Safety/Cognition/ Behavioral Observations  Supervision-Min A  Supervision   compensation for visual impairments, recall    Pain   PRN Tylenol 650 mg for c/o headache rating discomfort 5/10 on pain scale with relief  states none  Assess QS and prn  medicate with follow uo noted    Skin   Skin intact  Maintain skin integrity  Assess and document any changes in skin integrity QS and prn, notify MD/PA, no rashes or discomfort verbalize    Rehab Goals Patient on target to meet rehab goals: Yes *See Care Plan and progress notes for long and short-term goals.     Barriers to Discharge  Current Status/Progress Possible Resolutions Date Resolved   Physician    Medical stability        n/a      Nursing                  PT                    OT                  SLP                SW                Discharge Planning/Teaching Needs:  Home with family to provide/ arrange 24/7 supervision vs possible short term SNF  Teaching to be completed prior to d/c.   Team Discussion:  Patient continues with significant visual deficits.  Cognition much improved with primary need for supervision because of visual issues.  Supervision for toileting and supervision to set up for ADLs.  Social worker reports family meeting tomorrow to make a confirmed discharge plan.  Revisions to Treatment Plan:  None    Continued Need for Acute Rehabilitation Level of Care: The patient requires daily medical management by a physician with specialized training in physical medicine and rehabilitation for the following conditions: Daily direction of a multidisciplinary physical rehabilitation program to ensure safe treatment while eliciting the highest outcome that is of practical value to the patient.: Yes Daily medical management of patient stability for increased activity during participation in an intensive rehabilitation regime.: Yes Daily analysis of laboratory values and/or radiology reports with any subsequent need for medication adjustment of medical intervention for : Neurological problems  Max Romano 04/01/2017, 4:48 PM

## 2017-04-02 ENCOUNTER — Inpatient Hospital Stay (HOSPITAL_COMMUNITY): Payer: PPO | Admitting: Physical Therapy

## 2017-04-02 ENCOUNTER — Inpatient Hospital Stay (HOSPITAL_COMMUNITY): Payer: PPO | Admitting: Occupational Therapy

## 2017-04-02 ENCOUNTER — Inpatient Hospital Stay (HOSPITAL_COMMUNITY): Payer: PPO | Admitting: Speech Pathology

## 2017-04-02 ENCOUNTER — Encounter: Payer: Self-pay | Admitting: *Deleted

## 2017-04-02 DIAGNOSIS — H547 Unspecified visual loss: Secondary | ICD-10-CM

## 2017-04-02 DIAGNOSIS — H5340 Unspecified visual field defects: Secondary | ICD-10-CM

## 2017-04-02 MED ORDER — TROLAMINE SALICYLATE 10 % EX CREA: TOPICAL_CREAM | Freq: Three times a day (TID) | CUTANEOUS | Status: DC

## 2017-04-02 MED ORDER — CLOPIDOGREL BISULFATE 75 MG PO TABS: 75.0000 mg | ORAL_TABLET | Freq: Every day | ORAL | 0 refills | Status: DC

## 2017-04-02 MED ORDER — MUSCLE RUB 10-15 % EX CREA
TOPICAL_CREAM | Freq: Three times a day (TID) | CUTANEOUS | Status: DC | PRN
  Filled 2017-04-02: qty 85

## 2017-04-02 MED ORDER — AMLODIPINE BESYLATE 5 MG PO TABS: 5.0000 mg | ORAL_TABLET | Freq: Every day | ORAL | 0 refills | Status: DC

## 2017-04-02 MED ORDER — MUSCLE RUB 10-15 % EX CREA: 1.0000 "application " | TOPICAL_CREAM | Freq: Three times a day (TID) | CUTANEOUS | 0 refills | Status: DC | PRN

## 2017-04-02 MED ORDER — NEBIVOLOL HCL 2.5 MG PO TABS: 2.5000 mg | ORAL_TABLET | Freq: Every day | ORAL | 0 refills | Status: DC

## 2017-04-02 NOTE — Plan of Care (Signed)
13/13 LTGs achieved 04/02/17

## 2017-04-02 NOTE — Progress Notes (Signed)
Speech Language Pathology Session Note & Discharge Summary  Patient Details  Name: [name] [name] MRN: [id number] Date of Birth: 03/05/1925  Today's Date: 04/02/2017 SLP Individual Time: 1430-1500 SLP Individual Time Calculation (min): 30 min   Skilled Therapeutic Interventions:  Skilled treatment session focused on cognitive goals. SLP facilitated session by re-administering the MoCA-BLIND. Patient scored 19/22 points with a score of 18 or above considered normal. Patient demonstrated mild deficits in short-term recall. Patient's daughter present and all questions were answered at this time. Patient will d/c home tomorrow.   Patient has met 5 of 5 long term goals.  Patient to discharge at overall Modified Independent;Supervision level.   Reasons goals not met: N/A    Clinical Impression/Discharge Summary: Patient has made excellent gains and has met 5 of 5 LTG's this admission. Currently, patient is consuming regular textures with thin liquids without overt s/s of aspiration with Mod I. Patient also requires overall supervision level cues to complete functional and familiar tasks safely in regards to problem solving, recall, attention and awareness. Function is exacerbated by visual deficits. Patient and family education is complete and patient will discharge home with 24 hour supervision. Patient would benefit from f/u SLP services to maximize her cognitive function and overall functional independence.   Care Partner:  Caregiver Able to Provide Assistance: Yes  Type of Caregiver Assistance: Physical;Cognitive  Recommendation:  24 hour supervision/assistance;Outpatient SLP  Rationale for SLP Follow Up: Maximize cognitive function and independence;Reduce caregiver burden   Equipment: N/A   Reasons for discharge: Treatment goals met;Discharged from hospital   Patient/Family Agrees with Progress Made and Goals Achieved: Yes   Function:  Cognition Comprehension Comprehension assist  level: Understands complex 90% of the time/cues 10% of the time  Expression   Expression assist level: Expresses complex ideas: With extra time/assistive device  Social Interaction Social Interaction assist level: Interacts appropriately with others with medication or extra time (anti-anxiety, antidepressant).  Problem Solving Problem solving assist level: Solves basic 90% of the time/requires cueing < 10% of the time  Memory Memory assist level: Recognizes or recalls 90% of the time/requires cueing < 10% of the time   ,  04/02/2017, 3:23 PM    

## 2017-04-02 NOTE — Discharge Instructions (Signed)
Inpatient Rehab Discharge Instructions  [name] [name] Discharge date and time:  04/03/17  Activities/Precautions/ Functional Status: Activity: no lifting, driving, or strenuous exercise for until cleared by MD Diet: cardiac diet and diabetic diet Wound Care: none needed   Functional status:  ___ No restrictions     ___ Walk up steps independently _X__ 24/7 supervision/assistance   ___ Walk up steps with assistance ___ Intermittent supervision/assistance  ___ Bathe/dress independently ___ Walk with walker     _X__ Bathe/dress with assistance ___ Walk Independently    ___ Shower independently ___ Walk with assistance    ___ Shower with assistance _X__ No alcohol     ___ Return to work/school ________   COMMUNITY REFERRALS UPON DISCHARGE:    Home Health:   PT     OT                       Agency:  [facility] Phone: [phone]   Medical Equipment/Items Ordered:  rollator walker and tub transfer bench                                                                   Agency/Supplier:  [facility] @ [phone]    SROKE/TIA DISCHARGE INSTRUCTIONS SMOKING Cigarette smoking nearly doubles your risk of having a stroke & is the single most alterable risk factor  If you smoke or have smoked in the last 12 months, you are advised to quit smoking for your health.  Most of the excess cardiovascular risk related to smoking disappears within a year of stopping.  Ask you doctor about anti-smoking medications  [place] Quit Line: 1-800-QUIT NOW  Free Smoking Cessation Classes [phone]  CHOLESTEROL Know your levels; limit fat & cholesterol in your diet  Lipid Panel     Component Value Date/Time   CHOL 197 03/19/2017 0456   TRIG 86 03/19/2017 0456   HDL 62 03/19/2017 0456   CHOLHDL 3.2 03/19/2017 0456   VLDL 17 03/19/2017 0456   LDLCALC 118 (H) 03/19/2017 0456      Many patients benefit from treatment even if their cholesterol is at goal.  Goal: Total Cholesterol (CHOL)  less than 160  Goal:  Triglycerides (TRIG) less than 150  Goal:  HDL greater than 40  Goal:  LDL (LDLCALC) less than 100   BLOOD PRESSURE American Stroke Association blood pressure target is less that 120/80 mm/Hg  Your discharge blood pressure is:  BP: (!) 144/59  Monitor your blood pressure  Limit your salt and alcohol intake  Many individuals will require more than one medication for high blood pressure  DIABETES (A1c is a blood sugar average for last 3 months) Goal HGBA1c is under 7% (HBGA1c is blood sugar average for last 3 months)  Diabetes:     Lab Results  Component Value Date   HGBA1C 5.3 03/19/2017     Your HGBA1c can be lowered with medications, healthy diet, and exercise.  Check your blood sugar as directed by your physician  Call your physician if you experience unexplained or low blood sugars.  PHYSICAL ACTIVITY/REHABILITATION Goal is 30 minutes at least 4 days per week  Activity: No driving, Therapies:  Return to work:   Activity decreases your risk of  heart attack and stroke and makes your heart stronger.  It helps control your weight and blood pressure; helps you relax and can improve your mood.  Participate in a regular exercise program.  Talk with your doctor about the best form of exercise for you (dancing, walking, swimming, cycling).  DIET/WEIGHT Goal is to maintain a healthy weight  Your discharge diet is: Diet regular Room service appropriate? Yes; Fluid consistency: Thin  liquids Your height is:  Height: 5\' 3"  (160 cm) Your current weight is: Weight: 80.2 kg (176 lb 12.9 oz) Your Body Mass Index (BMI) is:  BMI (Calculated): 31.33  Following the type of diet specifically designed for you will help prevent another stroke.  Your goal weight is: 141 lbs  Your goal Body Mass Index (BMI) is 19-24.  Healthy food habits can help reduce 3 risk factors for stroke:  High cholesterol, hypertension, and excess weight.  RESOURCES Stroke/Support Group:   Call [phone]   STROKE EDUCATION PROVIDED/REVIEWED AND GIVEN TO PATIENT Stroke warning signs and symptoms How to activate emergency medical system (call 911). Medications prescribed at discharge. Need for follow-up after discharge. Personal risk factors for stroke. Pneumonia vaccine given:  Flu vaccine given:  My questions have been answered, the writing is legible, and I understand these instructions.  I will adhere to these goals & educational materials that have been provided to me after my discharge from the hospital.     My questions have been answered and I understand these instructions. I will adhere to these goals and the provided educational materials after my discharge from the hospital.  Patient/Caregiver Signature _______________________________ Date __________  Clinician Signature _______________________________________ Date __________  Please bring this form and your medication list with you to all your follow-up doctor's appointments.

## 2017-04-02 NOTE — Progress Notes (Addendum)
[facility] PHYSICAL MEDICINE & REHABILITATION     PROGRESS NOTE    Subjective/Complaints: No new complaints this morning.  Just finished breakfast.  Still struggles with her vision as far as getting set up for breakfast but can manage with extra time.  Right knee is sore this morning after doing some work with PT to practice getting up from the floor.  He does feel better than it did last night  ROS: pt denies nausea, vomiting, diarrhea, cough, shortness of breath or chest pain   Objective: Vital Signs: Blood pressure (!) 147/53, pulse 70, temperature 97.8 F (36.6 C), temperature source Oral, resp. rate 18, height 5\' 3"  (1.6 m), weight 80.2 kg (176 lb 12.9 oz), SpO2 97 %. No results found. Recent Labs    03/31/17 0532  WBC 4.7  HGB 13.4  HCT 42.6  PLT 181   No results for input(s): NA, K, CL, GLUCOSE, BUN, CREATININE, CALCIUM in the last 72 hours.  Invalid input(s): CO CBG (last 3)  No results for input(s): GLUCAP in the last 72 hours.  Wt Readings from Last 3 Encounters:  03/25/17 80.2 kg (176 lb 12.9 oz)  10/01/16 77.6 kg (171 lb)  04/29/16 77 kg (169 lb 12.8 oz)    Physical Exam:  Constitutional: She is oriented to person, place, and time. She appears well-developed and well-nourished.    No distress HENT:  Head: Normocephalic and atraumatic.  Eyes: Left eye opaque Neck: Normal range of motion. Neck supple.  Cardiovascular: RRR without murmur. No JVD      Respiratory: CTA Bilaterally without wheezes or rales. Normal effort    GI: BS +, non-tender, non-distended   Musculoskeletal: She exhibits no edema or tenderness.  Neurological: She is alert and oriented to person, place, and time.  Reasonable insight and awareness Speech clear and fairly appropriate Left eye opaque with ptosis--no vision through eye--  Left inattention.  And likely left hemianopsia, decreased visual acuity through OD--this has not changed.      Skin: Skin is warm and dry. No erythema.   Psychiatric: Patient is pleasant and appropriate    Assessment/Plan: 1. Balance and functional deficits secondary to right midbrain infarct which require 3+ hours per day of interdisciplinary therapy in a comprehensive inpatient rehab setting. Physiatrist is providing close team supervision and 24 hour management of active medical problems listed below. Physiatrist and rehab team continue to assess barriers to discharge/monitor patient progress toward functional and medical goals.  Function:  Bathing Bathing position   Position: Standing at sink  Bathing parts Body parts bathed by patient: Right arm, Left arm, Chest, Abdomen, Front perineal area, Buttocks, Right upper leg, Left upper leg, Right lower leg, Left lower leg Body parts bathed by helper: Back  Bathing assist Assist Level: Supervision or verbal cues      Upper Body Dressing/Undressing Upper body dressing   What is the patient wearing?: Bra, Button up shirt Bra - Perfomed by patient: Thread/unthread right bra strap, Thread/unthread left bra strap, Hook/unhook bra (pull down sports bra) Bra - Perfomed by helper: Hook/unhook bra (pull down sports bra)     Button up shirt - Perfomed by patient: Thread/unthread right sleeve, Thread/unthread left sleeve, Pull shirt around back, Button/unbutton shirt      Upper body assist Assist Level: Supervision or verbal cues      Lower Body Dressing/Undressing Lower body dressing   What is the patient wearing?: Pants, Socks, Shoes, Underwear Underwear - Performed by patient: Thread/unthread right underwear leg,  Thread/unthread left underwear leg, Pull underwear up/down   Pants- Performed by patient: Thread/unthread right pants leg, Thread/unthread left pants leg, Pull pants up/down, Fasten/unfasten pants       Socks - Performed by patient: Don/doff right sock, Don/doff left sock   Shoes - Performed by patient: Don/doff right shoe, Don/doff left shoe, Fasten right, Fasten left             Lower body assist Assist for lower body dressing: Supervision or verbal cues, Set up   Set up : To obtain clothing/put away  Toileting Toileting   Toileting steps completed by patient: Adjust clothing prior to toileting, Performs perineal hygiene, Adjust clothing after toileting Toileting steps completed by helper: Adjust clothing prior to toileting, Adjust clothing after toileting Toileting Assistive Devices: Grab bar or rail  Toileting assist Assist level: Supervision or verbal cues   Transfers Chair/bed transfer   Chair/bed transfer method: Ambulatory Chair/bed transfer assist level: Supervision or verbal cues Chair/bed transfer assistive device: Armrests     Locomotion Ambulation     Max distance: 150 ft  Assist level: Supervision or verbal cues   Wheelchair          Cognition Comprehension Comprehension assist level: Understands complex 90% of the time/cues 10% of the time  Expression Expression assist level: Expresses complex ideas: With extra time/assistive device  Social Interaction Social Interaction assist level: Interacts appropriately 90% of the time - Needs monitoring or encouragement for participation or interaction.  Problem Solving Problem solving assist level: Solves basic 90% of the time/requires cueing < 10% of the time  Memory Memory assist level: Recognizes or recalls 90% of the time/requires cueing < 10% of the time   Medical Problem List and Plan: 1.  Balance deficits, left inattention, cognitive deficits with deficits in problem solving functional tasks as well as poor safety awareness secondary to right anteromedial midbrain infarct.       -Continue PT OT speech.    -Discharge home tomorrow   -Patient to see Rehab MD in the office for transitional care encounter in 1-2 weeks.   2.  DVT Prophylaxis/Anticoagulation: Pharmaceutical: Lovenox bleeding 3. Chronic right hip pain/Headaches/Pain Management:  Tylenol prn.  Headaches resolved   Ice  for right knee-contusion.  4. Mood: LCSW to follow for evaluation and support.  5. Neuropsych: This patient is not fully capable of making decisions on her own behalf. 6. Skin/Wound Care: routine pressure relief measures 7. Fluids/Electrolytes/Nutrition: Monitor I/O.  Good appetite.      -Continue to encourage p.o. intake 8. HTN: Monitor blood pressure bid.    On Amlodipine and Bystolic at home, Bystolic resumed due to gradual elevation of pressures Vitals:   04/01/17 2041 04/02/17 0410  BP: (!) 154/58 (!) 147/53  Pulse: 77 70  Resp: 18 18  Temp:  97.8 F (36.6 C)  SpO2:  97%    -Resumde amlodipine at 5 mg daily---continue at this dose 10.  Dyslipidemia: on Lipitor.  11. H/o SVTs: Asymptomatic. To follow up with Dr. Burt Knack after discharge.   12. GERD: managed with pepcid.  See no concerning GI signs on exam today 13.  Insomnia, trial of melatonin--some improvement  LOS (Days) 9 A FACE TO FACE EVALUATION WAS PERFORMED  Meredith Staggers, MD 04/02/2017 8:43 AM

## 2017-04-02 NOTE — Plan of Care (Signed)
  Consults [facility] STROKE PATIENT EDUCATION Description See Patient Education module for education specifics  04/02/2017 0611 - Progressing by Etheleen Nicks, RN   RH SKIN INTEGRITY RH STG SKIN FREE OF INFECTION/BREAKDOWN Description No new breakdown prior to discharge with min assist  04/02/2017 0611 - Progressing by Etheleen Nicks, RN RH STG MAINTAIN SKIN INTEGRITY WITH ASSISTANCE Description STG Maintain Skin Integrity With  Mod I   04/02/2017 1856 - Progressing by Etheleen Nicks, RN   RH SAFETY RH STG ADHERE TO SAFETY PRECAUTIONS W/ASSISTANCE/DEVICE Description STG Adhere to Safety Precautions With mod I   04/02/2017 0611 - Progressing by Etheleen Nicks, RN   RH COGNITION-NURSING RH STG USES MEMORY AIDS/STRATEGIES W/ASSIST TO PROBLEM SOLVE Description STG Uses Memory Aids/Strategies With mod I  to Problem Solve.  04/02/2017 3149 - Progressing by Etheleen Nicks, RN RH STG ANTICIPATES NEEDS/CALLS FOR ASSIST W/ASSIST/CUES Description STG Anticipates Needs/Calls for Assist With mod I  /Cues.  04/02/2017 7026 - Progressing by Etheleen Nicks, RN

## 2017-04-02 NOTE — Consult Note (Addendum)
   [facility] Med CM Inpatient Consult   04/02/2017  [name] [name] May 13, 1924 [id number]    [facility] Care Management referral received from [employer].  Chart reviewed. [name] [name] is currently on inpatient rehab and is slated for discharge home on tomorrow 04/03/16.  Spoke with inpatient rehab LCSW [facility]) about referral for [facility] Care Management.  Went to bedside to speak with [name] [name] and daughter, [name] [name], at bedside to discuss [place] Management services.  [name] [name] and daughter are agreeable to [place] Management program services. [facility] Care Management written consent obtained and folder provided. Explained that [place] Management will not interfere or replace services provided by home health.  [name] [name] lives alone. Daughter states they are trying to arrange caregivers to stay with patient at home.  Confirmed Primary Care MD is Dr. Inda Merlin. Denies concerns with medications. Denies concerns with transportation. Daughter provides transportation to MD appointments.  [name] [name] asks that her daughter, [name] [name] be contacted for post transition of care calls. [name] [name] contact number is [phone].  Discussed referral to [place]. Daughter, [name] [name], declines needing a [facility] LCSW referral at this time. Made both patient and daughter aware that referral can be made to [facility] LCSW upon transition of care calls if needed.  Will make referral to [place].  Made inpatient rehab aware [place] Management to follow post discharge.    Marthenia Rolling, MSN-Ed, RN,BSN [facility] Liaison [phone]

## 2017-04-02 NOTE — Progress Notes (Signed)
Physical Therapy Discharge Summary  Patient Details  Name: [name] [name] MRN: [id number] Date of Birth: 11/23/1924  Today's Date: 04/02/2017 PT Individual Time: 1117-1200 and 2585-2778 PT Individual Time Calculation (min): 43 min and 62 min    Patient has met 8 of 8 long term goals due to improved activity tolerance, improved balance, improved postural control, increased strength, ability to compensate for deficits, functional use of  left upper extremity and left lower extremity, improved attention, improved awareness and improved coordination.  Patient to discharge at an ambulatory level Supervision with a rollator.  Reasons goals not met: n/a  Recommendation:  Patient will benefit from ongoing skilled PT services in home health setting to continue to advance safe functional mobility, address ongoing impairments in safety awareness, balance, progress gait with LRAD, and minimize fall risk.  Equipment: rollator  Reasons for discharge: treatment goals met  Patient/family agrees with progress made and goals achieved: Yes  Skilled PT Treatment Treatment 1: Pt receved in bed & agreeable to tx, denying c/o pain at rest. Session focused on grad day activities with pt completing all tasks at supervision<>steady assist level with rollator; please see below & function tab for details. Pt continues to require occasional cuing to slow down and for obstacle avoidance 2/2 impaired vision. At end of the session pt completed toileting, peri and hand hygiene, and clothing management with supervision. Pt left sitting in recliner in room with all needs within reach & BLE elevated.   During session when pt was turning to exit car she slightly bumped forehead on car door but reported no pain at all. Pt without any visible adverse symptoms and continued through remainder of session without complaints. RN made aware.   Treatment 2: Pt received in room & agreeable to tx, without c/o pain. Pt ambulated  room<>gym with rollator and supervision. Pt completed Berg Balance Test & scored [phone]; educated pt on interpretation of score & current fall risk. Patient demonstrates increased fall risk as noted by score of 41/56 on Berg Balance Scale.  (<36= high risk for falls, close to 100%; 37-45 significant >80%; 46-51 moderate >50%; 52-55 lower >25%). Pt negotiated 12 steps (6") with 1 rail with supervision. Pt utilized nu-step on level 2 x 3 minutes + level 5 x 5 minutes + level 2 x 2 minutes for endurance training; pt reported 7/10 RPE. Pt returned to room and completed toileting with supervision and cuing for safety in unfamiliar environment 2/2 impaired vision. Pt returned to bed & therapist provided assistance setting pt up with meal tray. At end of session pt left in bed with all needs within reach & bed alarm set. Pt denied any questions or concerns regarding d/c home.     PT Discharge Precautions/Restrictions Precautions Precautions: Fall Precaution Comments: decreased vision R eye, blind L eye Restrictions Weight Bearing Restrictions: No  Vision/Perception  Pt legally blind in L eye, and impaired vision in R eye (worse following CVA).   Cognition Overall Cognitive Status: Impaired/Different from baseline Arousal/Alertness: Awake/alert Orientation Level: Oriented X4 Memory: Impaired Memory Impairment: Decreased recall of new information Safety/Judgment: Impaired Comments: requires cuing to slow down & adapt 2/2 new vision impairments  Sensation Sensation Light Touch: (pt reports numbness in 3rd & 4th digit on L hand and L side of bottom lip) Coordination Gross Motor Movements are Fluid and Coordinated: Yes   Motor  Motor Motor: Hemiplegia(left sided) Motor - Discharge Observations: general weakness   Mobility Bed Mobility Bed Mobility: Rolling Right;Rolling Left;Supine to Sit;Sit  to Supine(regular bed) Rolling Right: 6: Modified independent (Device/Increase time) Rolling Left:  6: Modified independent (Device/Increase time) Supine to Sit: 6: Modified independent (Device/Increase time) Sit to Supine: 6: Modified independent (Device/Increase time) Transfers Transfers: Yes Sit to Stand: 5: Supervision  Locomotion  Ambulation Ambulation: Yes Ambulation/Gait Assistance: 5: Supervision Ambulation Distance (Feet): 200 Feet Assistive device: Rollator Ambulation/Gait Assistance Details: cuing for obstacle avoidance 2/2 decreased vision Stairs / Additional Locomotion Stairs: Yes Stairs Assistance: 5: Supervision Stair Management Technique: One rail Right;One rail Left Number of Stairs: 12 Height of Stairs: 6(inches) Ramp: 5: Supervision(rollator) Wheelchair Mobility Wheelchair Mobility: No   Trunk/Postural Assessment  Cervical Assessment Cervical Assessment: (forward head) Thoracic Assessment Thoracic Assessment: (kyphosis) Postural Control Postural Control: Within Functional Limits   Balance Balance Balance Assessed: Yes Standardized Balance Assessment Standardized Balance Assessment: Berg Balance Test Berg Balance Test Sit to Stand: Able to stand without using hands and stabilize independently Standing Unsupported: Able to stand safely 2 minutes Sitting with Back Unsupported but Feet Supported on Floor or Stool: Able to sit safely and securely 2 minutes Stand to Sit: Sits safely with minimal use of hands Transfers: Able to transfer safely, minor use of hands Standing Unsupported with Eyes Closed: Able to stand 10 seconds with supervision Standing Ubsupported with Feet Together: Able to place feet together independently and stand for 1 minute with supervision From Standing, Reach Forward with Outstretched Arm: Can reach forward >12 cm safely (5") From Standing Position, Pick up Object from Floor: Able to pick up shoe, needs supervision From Standing Position, Turn to Look Behind Over each Shoulder: Looks behind from both sides and weight shifts  well Turn 360 Degrees: Needs close supervision or verbal cueing(but completes both turns) Standing Unsupported, Alternately Place Feet on Step/Stool: Able to complete 4 steps without aid or supervision(completes 8 steps with close supervision) Standing Unsupported, One Foot in Front: Able to take small step independently and hold 30 seconds Standing on One Leg: Unable to try or needs assist to prevent fall Total Score: 41    Extremity Assessment  RLE Assessment RLE Assessment: Within Functional Limits LLE Assessment LLE Assessment: Within Functional Limits   See Function Navigator for Current Functional Status.  Waunita Schooner 04/02/2017, 4:50 PM

## 2017-04-02 NOTE — Progress Notes (Addendum)
Occupational Therapy Discharge Summary  Patient Details  Name: [name] [name] MRN: [id number] Date of Birth: 1924-07-23  Today's Date: 04/02/2017 OT Individual Time: 9735-3299 OT Individual Time Calculation (min): 58 min   Patient has met 13 of 13 long term goals due to improved activity tolerance, improved balance, postural control, ability to compensate for deficits, improved attention, improved awareness and improved coordination.  Patient to discharge at overall Supervision level.  Family has not  been present during OT sessions. Patient's care partner, daughter, to provide the necessary physical and cognitive assistance at discharge.    All goals met.   Recommendation:  Patient will benefit from ongoing skilled OT services in home health setting to continue to advance functional skills in the area of BADL and iADL.  Equipment: TTB  Reasons for discharge: treatment goals met  Patient/family agrees with progress made and goals achieved: Yes  Skilled Therapeutic Intervention:  Pt greeted supine in bed, agreeable to tx. Pt using rollator to transfer to toilet and then to TTB to shower. Pt requiring max vcs for safe mgt of DME during transfers. Pt bathing at sit<stand level with cues for using compensatory strategies to locate necessary items. Afterwards, pt transferred to toilet to void bladder 2nd time, and then proceeded to dress. Pt requiring overall supervision for device mgt. Afterwards she completed grooming tasks at sink, sitting on rollator seat with Mod I. Pt transferred EOB with rollator, opted to remain sitting there in case she wanted to lie back down in a bit. Educated pt on environmental modifications she could implement in home to increase functional independence with laundry/meal prep, as long as she had supervision from family members. Pt left EOB with bed alarm activated and all needs within reach.   OT Discharge Precautions/Restrictions  Precautions Precautions:  Fall Precaution Comments: decreased vision R eye, blind L eye Restrictions Weight Bearing Restrictions: No Vital Signs Therapy Vitals Temp: 98.1 F (36.7 C) Temp Source: Oral Pulse Rate: 73 Resp: 18 BP: (!) 144/59 Patient Position (if appropriate): Sitting Oxygen Therapy SpO2: 97 % O2 Device: Not Delivered Pain No c/o pain during tx    ADL ADL ADL Comments: Please see functional navigator for ADL status Vision Baseline Vision/History: Legally blind(legally blind in L eye, decreased vision R eye) Wears Glasses: Reading only Patient Visual Report: Blurring of vision(Blurring of vision, Lt eye blindness) Additional Comments: Pt able to distinguish bright colors and shapes of items during self care tasks. Worked on using tactile compensatory strategies along with stated visual abilities to maximize her independence with BADL/IADL tasks Praxis Praxis: Intact Cognition Overall Cognitive Status: Impaired/Different from baseline Arousal/Alertness: Awake/alert Orientation Level: Oriented X4 Attention: Sustained Sustained Attention: Appears intact Memory: Impaired Awareness: Appears intact Problem Solving: Appears intact Safety/Judgment: Impaired Sensation Sensation Light Touch: Appears Intact Stereognosis: Not tested Hot/Cold: Appears Intact Proprioception: Appears Intact Coordination Gross Motor Movements are Fluid and Coordinated: Yes Fine Motor Movements are Fluid and Coordinated: Yes Coordination and Movement Description: WFL for completing BADL tasks at supervision level  Motor  Motor Motor: Within Functional Limits Motor - Discharge Observations: Global weakness, though improved from time of evaluation Mobility  Transfers Transfers: Sit to Stand;Stand to Sit Sit to Stand: 5: Supervision;From toilet Sit to Stand Details: Verbal cues for safe use of DME/AE;Verbal cues for precautions/safety Stand to Sit: To toilet;5: Supervision Stand to Sit Details (indicate cue  type and reason): Verbal cues for safe use of DME/AE;Verbal cues for precautions/safety  Trunk/Postural Assessment  Cervical Assessment Cervical Assessment: Within Functional  Limits Thoracic Assessment Thoracic Assessment: Exceptions to WFL(kyphotic) Lumbar Assessment Lumbar Assessment: Within Functional Limits Postural Control Postural Control: Within Functional Limits  Balance Balance Balance Assessed: Yes Dynamic Sitting Balance Dynamic Sitting - Balance Support: No upper extremity supported;Feet supported Dynamic Sitting - Level of Assistance: 5: Stand by assistance Sitting balance - Comments: bathing while seated on TTB Dynamic Standing Balance Dynamic Standing - Balance Support: Left upper extremity supported Dynamic Standing - Level of Assistance: 5: Stand by assistance(LB dressing + clothing mgt during toileting) Extremity/Trunk Assessment RUE Assessment RUE Assessment: Within Functional Limits LUE Assessment LUE Assessment: Within Functional Limits   See Function Navigator for Current Functional Status.  Shalla Bulluck A Kristyn Obyrne 04/02/2017, 4:14 PM

## 2017-04-03 DIAGNOSIS — I5033 Acute on chronic diastolic (congestive) heart failure: Secondary | ICD-10-CM

## 2017-04-03 DIAGNOSIS — H5440 Blindness, one eye, unspecified eye: Secondary | ICD-10-CM

## 2017-04-03 DIAGNOSIS — I10 Essential (primary) hypertension: Secondary | ICD-10-CM

## 2017-04-03 DIAGNOSIS — G479 Sleep disorder, unspecified: Secondary | ICD-10-CM

## 2017-04-03 DIAGNOSIS — H548 Legal blindness, as defined in USA: Secondary | ICD-10-CM

## 2017-04-03 DIAGNOSIS — Z8679 Personal history of other diseases of the circulatory system: Secondary | ICD-10-CM

## 2017-04-03 NOTE — Progress Notes (Signed)
[facility] PHYSICAL MEDICINE & REHABILITATION     PROGRESS NOTE    Subjective/Complaints: Patient seen lying in bed this morning. She states she slept well overnight. She states she is ready to go home today.  ROS: Denies CP, SOB, nausea, vomiting, diarrhea.  Objective: Vital Signs: Blood pressure (!) 158/69, pulse 77, temperature 97.9 F (36.6 C), temperature source Oral, resp. rate 18, height 5\' 3"  (1.6 m), weight 80.2 kg (176 lb 12.9 oz), SpO2 95 %. No results found. No results for input(s): WBC, HGB, HCT, PLT in the last 72 hours. No results for input(s): NA, K, CL, GLUCOSE, BUN, CREATININE, CALCIUM in the last 72 hours.  Invalid input(s): CO CBG (last 3)  No results for input(s): GLUCAP in the last 72 hours.  Wt Readings from Last 3 Encounters:  03/25/17 80.2 kg (176 lb 12.9 oz)  10/01/16 77.6 kg (171 lb)  04/29/16 77 kg (169 lb 12.8 oz)    Physical Exam:  Constitutional: She appears well-developed and well-nourished. NAD. HENT: Normocephalic and atraumatic.  Eyes: Left eye opaque. No discharge. Cardiovascular: RRR. No JVD      Respiratory: CTA Bilaterally. Normal effort    GI: BS +, non-distended   Musculoskeletal: She exhibits no edema or tenderness.  Neurological: She is alert and oriented.   Reasonable insight and awareness Speech clear and fairly appropriate Left inattention.   Motor: 5/5 grossly throughout Skin: Skin is warm and dry. No erythema.  Psychiatric: Patient is pleasant and appropriate  Assessment/Plan: 1. Balance and functional deficits secondary to right midbrain infarct which require 3+ hours per day of interdisciplinary therapy in a comprehensive inpatient rehab setting. Physiatrist is providing close team supervision and 24 hour management of active medical problems listed below. Physiatrist and rehab team continue to assess barriers to discharge/monitor patient progress toward functional and medical goals.  Function:  Bathing Bathing  position   Position: Shower  Bathing parts Body parts bathed by patient: Right arm, Left arm, Chest, Abdomen, Front perineal area, Buttocks, Right upper leg, Left upper leg, Right lower leg, Left lower leg Body parts bathed by helper: Back  Bathing assist Assist Level: Supervision or verbal cues      Upper Body Dressing/Undressing Upper body dressing   What is the patient wearing?: Button up shirt Bra - Perfomed by patient: Thread/unthread right bra strap, Thread/unthread left bra strap, Hook/unhook bra (pull down sports bra) Bra - Perfomed by helper: Hook/unhook bra (pull down sports bra)     Button up shirt - Perfomed by patient: Thread/unthread right sleeve, Thread/unthread left sleeve, Pull shirt around back, Button/unbutton shirt      Upper body assist Assist Level: Supervision or verbal cues, Set up   Set up : To obtain clothing/put away  Lower Body Dressing/Undressing Lower body dressing   What is the patient wearing?: Pants, Socks, Shoes, Underwear Underwear - Performed by patient: Thread/unthread right underwear leg, Thread/unthread left underwear leg, Pull underwear up/down   Pants- Performed by patient: Thread/unthread right pants leg, Thread/unthread left pants leg, Pull pants up/down, Fasten/unfasten pants       Socks - Performed by patient: Don/doff right sock, Don/doff left sock   Shoes - Performed by patient: Don/doff right shoe, Don/doff left shoe            Lower body assist Assist for lower body dressing: Supervision or verbal cues, Set up   Set up : To obtain clothing/put away  Toileting Toileting   Toileting steps completed by patient: Adjust clothing  prior to toileting, Performs perineal hygiene, Adjust clothing after toileting Toileting steps completed by helper: Adjust clothing prior to toileting, Adjust clothing after toileting Toileting Assistive Devices: Grab bar or rail  Toileting assist Assist level: Supervision or verbal cues    Transfers Chair/bed transfer   Chair/bed transfer method: Ambulatory Chair/bed transfer assist level: Supervision or verbal cues Chair/bed transfer assistive device: Walker(rollator)     Locomotion Ambulation     Max distance: >150 ft  Assist level: Supervision or verbal cues   Wheelchair Wheelchair activity did not occur: N/A        Cognition Comprehension Comprehension assist level: Understands complex 90% of the time/cues 10% of the time  Expression Expression assist level: Expresses complex ideas: With extra time/assistive device  Social Interaction Social Interaction assist level: Interacts appropriately with others with medication or extra time (anti-anxiety, antidepressant).  Problem Solving Problem solving assist level: Solves basic 90% of the time/requires cueing < 10% of the time  Memory Memory assist level: Recognizes or recalls 90% of the time/requires cueing < 10% of the time   Medical Problem List and Plan: 1.  Balance deficits, left inattention, cognitive deficits with deficits in problem solving functional tasks as well as poor safety awareness secondary to right anteromedial midbrain infarct.       Discharged today   -Patient to see Rehab MD in the office for transitional care encounter in 1-2 weeks.  2.  DVT Prophylaxis/Anticoagulation: Pharmaceutical: Lovenox bleeding 3. Chronic right hip pain/Headaches/Pain Management:  Tylenol prn.  Headaches resolved   Ice for right knee-contusion.  4. Mood: LCSW to follow for evaluation and support.  5. Neuropsych: This patient is ?not fully capable of making decisions on her own behalf. 6. Skin/Wound Care: routine pressure relief measures 7. Fluids/Electrolytes/Nutrition: Monitor I/O.  Good appetite.      -Continue to encourage p.o. intake 8. HTN: Monitor blood pressure bid.    On Amlodipine and Bystolic at home, Bystolic resumed due to gradual elevation of pressures Vitals:   04/02/17 1408 04/03/17 0244  BP: (!)  144/59 (!) 158/69  Pulse: 73 77  Resp: 18 18  Temp: 98.1 F (36.7 C) 97.9 F (36.6 C)  SpO2: 97% 95%    -Resumde amlodipine at 5 mg daily   Remain slightly elevated, will need ambulatory monitoring and adjustments 10.  Dyslipidemia: on Lipitor.  11. H/o SVTs: Asymptomatic. To follow up with Dr. Burt Knack after discharge.   12. GERD: managed with pepcid.  See no concerning GI signs on exam today 13.  Insomnia, trial of melatonin--some improvement  LOS (Days) 10 A FACE TO FACE EVALUATION WAS PERFORMED  Chey Rachels Lorie Phenix, MD 04/03/2017 10:29 AM

## 2017-04-03 NOTE — Progress Notes (Signed)
Pt in room with family receiving discharge instructions, in good mood and ready to leave.

## 2017-04-05 ENCOUNTER — Telehealth: Payer: Self-pay | Admitting: Cardiovascular Disease

## 2017-04-05 NOTE — Telephone Encounter (Signed)
New Message  Patients daughter is calling about appointment that is scheduled for 1/17. They are unable to keep the appointment and is insisting that the nurse work the patient in the schedule for another day. Please call.

## 2017-04-05 NOTE — Telephone Encounter (Signed)
Left message that Dr. Burt Knack is out of the office next week. Left message that they will be called Wednesday to arrange appointment with Dr. Burt Knack.

## 2017-04-05 NOTE — Progress Notes (Signed)
Social Work  Discharge Note  The overall goal for the admission was met for:   Discharge location: Yes - home with children and private duty covering 24/7 supervision  Length of Stay: Yes - 10 days  Discharge activity level: Yes - supervision  Home/community participation: Yes  Services provided included: MD, RD, PT, OT, SLP, RN, TR, Pharmacy, Neuropsych and SW  Financial Services: Private Insurance: [employer]  Follow-up services arranged: Home Health: PT, OT via [facility], DME: rollator walker, tub bench via [facility] and Patient/Family has no preference for HH/DME agencies  Comments (or additional information):  Patient/Family verbalized understanding of follow-up arrangements: Yes  Individual responsible for coordination of the follow-up plan: pt  Confirmed correct DME delivered: ,  04/05/2017    ,  

## 2017-04-06 ENCOUNTER — Other Ambulatory Visit: Payer: Self-pay

## 2017-04-06 NOTE — Patient Outreach (Addendum)
[facility]) Care Management  04/06/2017  [name] [name] [birth year] [id number]   84 year old with history of right brain stroke, HTN, SVT, Funch's disease, sepsis. Discharged from inpatient rehabilitation 1/2-1/12.   RNCM called for transition of care. Spoke with both client and her daughter([name] [name]) per client's permission. Ms. Linskey reports she is glad to be home. She reports she would be ok except she is not able to see now. She was living alone, but now is staying with her daughter.   Medications reviewed: Ms. Kevan Ny states that she has all client's medication. She states she was given parameters at discharge for the medication amlodipine and bystolic. Ms. Kevan Ny states that client has had to take the medication based on the parameters. She also reports that she is waiting on a call from Cardiologist nurse to discuss the parameters.  Daughter reports she is arranging office follow up visits now.  RNCM discussed the transition of care program. Client request telephonic follow up next week instead of a home visit.  Plan: transition of care call next week.  [name], RN, MSN, [place] Coordinator Cell: [phone]

## 2017-04-07 NOTE — Telephone Encounter (Signed)
[name] [name] states they will keep appointment tomorrow instead of moving to 1/30. She was grateful for call back.

## 2017-04-08 ENCOUNTER — Encounter: Payer: Self-pay | Admitting: Cardiovascular Disease

## 2017-04-08 ENCOUNTER — Ambulatory Visit: Payer: PPO | Admitting: Cardiovascular Disease

## 2017-04-08 VITALS — BP 152/72 | HR 79 | Ht 62.5 in | Wt 169.8 lb

## 2017-04-08 DIAGNOSIS — I2510 Atherosclerotic heart disease of native coronary artery without angina pectoris: Secondary | ICD-10-CM | POA: Diagnosis not present

## 2017-04-08 DIAGNOSIS — I6930 Unspecified sequelae of cerebral infarction: Secondary | ICD-10-CM | POA: Diagnosis not present

## 2017-04-08 DIAGNOSIS — I10 Essential (primary) hypertension: Secondary | ICD-10-CM | POA: Diagnosis not present

## 2017-04-08 DIAGNOSIS — R300 Dysuria: Secondary | ICD-10-CM | POA: Diagnosis not present

## 2017-04-08 DIAGNOSIS — N390 Urinary tract infection, site not specified: Secondary | ICD-10-CM | POA: Diagnosis not present

## 2017-04-08 DIAGNOSIS — R109 Unspecified abdominal pain: Secondary | ICD-10-CM | POA: Diagnosis not present

## 2017-04-08 MED ORDER — NEBIVOLOL HCL 2.5 MG PO TABS: 2.5000 mg | ORAL_TABLET | Freq: Every day | ORAL | 3 refills | Status: DC

## 2017-04-08 MED ORDER — AMLODIPINE BESYLATE 5 MG PO TABS: 5.0000 mg | ORAL_TABLET | Freq: Every day | ORAL | 3 refills | Status: DC

## 2017-04-08 NOTE — Patient Instructions (Addendum)
Medication Instructions:  Your medications have been refilled. Please continue amlodipine 5 mg daily. Please continue Bystolic 2.5 mg daily. You may take an addition 2.5 mg if the top number of your blood pressure is over 180.  Labwork: None  Testing/Procedures: None  Follow-Up: You have an appointment scheduled with Dr. Burt Knack on 07/01/2017 at 2:20PM. Please arrive 15 minutes prior to your appointment.  Any Other Special Instructions Will Be Listed Below (If Applicable).     If you need a refill on your cardiac medications before your next appointment, please call your pharmacy.

## 2017-04-08 NOTE — Progress Notes (Signed)
Cardiology Office Note Date:  04/08/2017   ID:  [name] [name], DOB 09-Oct-1924, MRN [id number]  PCP:  Darcus Austin, MD  Cardiologist:  [name], MD    Chief Complaint  Patient presents with  . Hospitalization Follow-up     History of Present Illness: [name] [name] is a 84 y.o. female who presents for follow-up of hypertension, SVT, and coronary artery disease. Patient had a non-ST elevation infarction in 2015 and she was treated conservatively. She returned with anginal chest pain in May 2017 and underwent stenting of severe stenosis in the ramus intermedius. She was noted to have moderate LAD stenosis with medical therapy recommended. FFR was done and was negative. Left circumflex is widely patent and dominant. LV function has been normal. She has had long-standing symptomatic palpitations.   The patient was hospitalized last month with sudden vision loss in the right eye.  She was diagnosed with an acute right brainstem stroke.  Her symptoms improved and TPA was not administered.  She had an extended stay in inpatient rehab and was just discharged last week.  Her loop recorder was interrogated and demonstrated no evidence of atrial fibrillation.  She has remained on dual antiplatelet therapy with aspirin and clopidogrel.  In review of her discharge summary, she has continued to have labile blood pressure readings.  She's here with her daughter today. There's some confusion about antihypertensive dosing. At present she's taking bystolic 2.5 mg daily and amlodipine 5 mg daily.  They bring in home blood pressure readings which are averaging approximately 150/70.  She has had some focal pleuritic discomfort in the left chest, but nothing that is bothering her too much.  No substernal pain or pressure.  No shortness of breath or leg swelling.  She is really struggling with her loss of independence related to progressive vision loss.  She is living with her daughter at present but having a tough  time getting around much because she cannot see well.  Past Medical History:  Diagnosis Date  . Arthritis    "thumbs, joints" (03/23/2017  . Basal cell carcinoma    "several; scattered over my face, hands, leg some cut off; some burned off"  . Blind left eye    a. Corneal transplant x3 with rejection  . CAD (coronary artery disease)    a. s/p NSTEMI 2015 treated conservatively; nuclear stress test low risk. b. Continued angina despite med rx 07/2015 - s/p io [place] Stent to ramus intermedius with mod LAD stenosis neg by FFR.  . Complication of anesthesia    "very sensitive to RX"  . Diverticulitis large intestine   . Diverticulosis   . Family history of adverse reaction to anesthesia    "we all get PONV" (1/1/201)  . Gastritis   . GERD (gastroesophageal reflux disease)   . Hayfever   . Helicobacter pylori (H. pylori) 05/22/02   RUT-Positive  . Hypertension   . Myocardial infarction ([place]) 2015  . Squamous carcinoma    "several; scattered over my face, hands, leg some cut off; some burned off"  . Stroke [facility]) 2015   denies residual on 08/07/2015  . Stroke [facility]) 03/18/2017   "has effected her vision, balance, some memory" (03/23/2017)  . SVT (supraventricular tachycardia) (HCC)    a. seen by Dr. Caryl Comes - has loop recorder in. Patient has declined amiodarone due to side effect profile  . Varicose veins     Past Surgical History:  Procedure Laterality Date  . BASAL CELL CARCINOMA EXCISION     "  several; scattered over my face, hands, leg some cut off; some burned off"  . CARDIAC CATHETERIZATION N/A 08/07/2015   Procedure: Left Heart Cath and Coronary Angiography;  Surgeon: [name], MD;  Location: [place] [facility];  Service: Cardiovascular;  Laterality: N/A;  . CARDIAC CATHETERIZATION N/A 08/07/2015   Procedure: Coronary Stent Intervention;  Surgeon: [name], MD;  Location: [place] [facility];  Service: Cardiovascular;  Laterality: N/A;  . CARDIAC CATHETERIZATION N/A  08/07/2015   Procedure: Intravascular Pressure Wire/FFR Study;  Surgeon: [name], MD;  Location: [place] [facility];  Service: Cardiovascular;  Laterality: N/A;  . CATARACT EXTRACTION W/ INTRAOCULAR LENS  IMPLANT, BILATERAL Bilateral 2005  . CLOSED REDUCTION SHOULDER DISLOCATION Left 2016 X 2  . CORNEAL TRANSPLANT Bilateral right 2009, left 2009    3 in left eye (last 2 failed), 1 in right eye  . DILATION AND CURETTAGE OF UTERUS    . EYE SURGERY    . JOINT REPLACEMENT    . [place]   "had gangrene in it"  . LOOP RECORDER IMPLANT N/A 01/05/2014   Procedure: LOOP RECORDER IMPLANT;  Surgeon: Coralyn Mark, MD;  Location: [place] [facility];  Service: Cardiovascular;  Laterality: N/A;  . SQUAMOUS CELL CARCINOMA EXCISION     "several; scattered over my face, hands, leg some cut off; some burned off"  . TONSILLECTOMY    . TOTAL ABDOMINAL HYSTERECTOMY  11/1983   "ovaries and all"  . TOTAL KNEE ARTHROPLASTY Left 06/27/2012   Procedure: LEFT TOTAL KNEE ARTHROPLASTY;  Surgeon: Gearlean Alf, MD;  Location: [facility];  Service: Orthopedics;  Laterality: Left;  . TOTAL KNEE ARTHROPLASTY Right 12/12/2012   Procedure: RIGHT TOTAL KNEE ARTHROPLASTY;  Surgeon: Gearlean Alf, MD;  Location: [facility];  Service: Orthopedics;  Laterality: Right;  . TUBAL LIGATION  1966    Current Outpatient Medications  Medication Sig Dispense Refill  . amLODipine (NORVASC) 5 MG tablet Take 1 tablet (5 mg total) by mouth daily with supper. 90 tablet 3  . aspirin EC 81 MG EC tablet Take 1 tablet (81 mg total) by mouth daily. 30 tablet 0  . clopidogrel (PLAVIX) 75 MG tablet Take 1 tablet (75 mg total) by mouth daily. 30 tablet 0  . Magnesium 250 MG TABS Take 250 mg by mouth at bedtime.    . Melatonin 1 MG SUBL Place 1 mg under the tongue at bedtime.    . Menthol-Methyl Salicylate (MUSCLE RUB) 10-15 % CREA Apply 1 application topically 3 (three) times daily as needed for muscle pain.  0  . Multiple  Vitamin (MULTI VITAMIN DAILY PO) Take 1 tablet by mouth every morning.    . Multiple Vitamins-Minerals (PRESERVISION AREDS 2) CAPS Take 1 capsule by mouth daily after breakfast.    . nebivolol (BYSTOLIC) 2.5 MG tablet Take 1 tablet (2.5 mg total) by mouth daily. Take an additional 2.5 mg for systolic blood pressure (the top number) over 180. 110 tablet 3  . nitroGLYCERIN (NITROSTAT) 0.4 MG SL tablet Place 1 tablet (0.4 mg total) under the tongue every 5 (five) minutes as needed for chest pain. 25 tablet 2  . Polyethyl Glycol-Propyl Glycol (SYSTANE ULTRA) 0.4-0.3 % SOLN Apply 1 drop to eye 2 (two) times daily as needed (for irritation).     Vladimir Faster Glycol-Propyl Glycol (SYSTANE) 0.4-0.3 % GEL ophthalmic gel Place 1 application into both eyes at bedtime.    . prednisoLONE acetate (PRED FORTE) 1 % ophthalmic suspension  Place 1 drop into both eyes See admin instructions. 1 drop into both eyes at bedtime spaced 5 minutes apart from Systane gel    . ranitidine (ZANTAC) 150 MG tablet Take 1 tablet (150 mg total) by mouth at bedtime. 90 tablet 3   No current facility-administered medications for this visit.     Allergies:   Contrast media [iodinated diagnostic agents]; Fluorescein; Hydralazine hcl; Sulfa antibiotics; Ultram [tramadol]; Yellow dye; Buprenex [buprenorphine hcl]; Keflex [cephalexin]; Lipitor [atorvastatin]; Augmentin [amoxicillin-pot clavulanate]; Levofloxacin; Percocet [oxycodone-acetaminophen]; Tape; and Zetia [ezetimibe]   Social History:  The patient  reports that  has never smoked. she has never used smokeless tobacco. She reports that she does not drink alcohol or use drugs.   Family History:  The patient's family history includes Cancer in her brother; Heart attack in her brother, brother, brother, brother, and father; Parkinsonism in her brother; Stroke in her mother.    ROS:  Please see the history of present illness.  All other systems are reviewed and negative.    PHYSICAL  EXAM: VS:  BP (!) 152/72   Pulse 79   Ht 5' 2.5" (1.588 m)   Wt 169 lb 12.8 oz (77 kg)   BMI 30.56 kg/m  , BMI Body mass index is 30.56 kg/m. GEN: Well nourished, well developed, pleasant elderly woman in no acute distress  HEENT: normal  Neck: no JVD, no masses. No carotid bruits Cardiac: RRR without murmur or gallop                Respiratory:  clear to auscultation bilaterally, normal work of breathing GI: soft, nontender, nondistended, + BS MS: no deformity or atrophy  Ext: no pretibial edema, pedal pulses 2+= bilaterally Skin: warm and dry, no rash Neuro:  Strength and sensation are intact Psych: euthymic mood, full affect  EKG:  EKG is ordered today.  Recent Labs: 03/25/2017: ALT 38; BUN 13; Creatinine, Ser 0.61; Potassium 4.3; Sodium 138 03/31/2017: Hemoglobin 13.4; Platelets 181   Lipid Panel     Component Value Date/Time   CHOL 197 03/24/2017 0456   TRIG 86 03-24-2017 0456   HDL 62 03/24/17 0456   CHOLHDL 3.2 03/24/2017 0456   VLDL 17 03-24-17 0456   LDLCALC 118 (H) 03-24-2017 0456      Wt Readings from Last 3 Encounters:  04/08/17 169 lb 12.8 oz (77 kg)  03/25/17 176 lb 12.9 oz (80.2 kg)  10/01/16 171 lb (77.6 kg)     Cardiac Studies Reviewed: Echocardiogram March 24, 2017: Study Conclusions  - Left ventricle: The cavity size was normal. Systolic function was   normal. The estimated ejection fraction was in the range of 55%   to 60%. Left ventricular diastolic function parameters were   normal. - Aortic valve: Mildly thickened, moderately calcified leaflets.   There was mild regurgitation. - Mitral valve: There was mild regurgitation. - Left atrium: The atrium was normal in size. - Right ventricle: The cavity size was normal. Wall thickness was   normal. Systolic function was normal. - Right atrium: The atrium was normal in size. - Tricuspid valve: There was mild regurgitation. - Pulmonic valve: There was no regurgitation. - Pericardium,  extracardiac: The pericardium was normal in   appearance.  Impressions:  - No cardiac source of emboli was indentified.  ASSESSMENT AND PLAN: 1.  Coronary artery disease, native vessel, without angina: The patient appears stable on her current medical program.  She continues on aspirin, clopidogrel, and a beta-blocker.  She is intolerant to  statins and ezetimibe.  2.  Labile hypertension: Blood pressure seems to have stabilized based on review of her home readings.  I have recommended that she continue on Bystolic 2.5 mg daily, amlodipine 5 mg daily, and as needed use of Bystolic 2.5 mg for systolic blood pressure greater than 180.  3.  Paroxysmal SVT: No recent heart palpitations.  She asks about whether to have her implantable loop recorder removed.  I will discuss with the EP team.  4.  Recurrent stroke: No atrial fibrillation on review of her loop recorder.  She is followed by neurology and treated with dual antiplatelet therapy using aspirin and clopidogrel.  Current medicines are reviewed with the patient today.  The patient does not have concerns regarding medicines.  Labs/ tests ordered today include:  No orders of the defined types were placed in this encounter.   Disposition:   FU 3 months  Signed, [name], MD  04/08/2017 5:42 PM    [facility] [place], [place], [place]  [address] Phone: [phone]; Fax: [phone]

## 2017-04-12 DIAGNOSIS — R2689 Other abnormalities of gait and mobility: Secondary | ICD-10-CM | POA: Diagnosis not present

## 2017-04-12 DIAGNOSIS — Z96653 Presence of artificial knee joint, bilateral: Secondary | ICD-10-CM | POA: Diagnosis not present

## 2017-04-12 DIAGNOSIS — I2510 Atherosclerotic heart disease of native coronary artery without angina pectoris: Secondary | ICD-10-CM | POA: Diagnosis not present

## 2017-04-12 DIAGNOSIS — Z85828 Personal history of other malignant neoplasm of skin: Secondary | ICD-10-CM | POA: Diagnosis not present

## 2017-04-12 DIAGNOSIS — Z8673 Personal history of transient ischemic attack (TIA), and cerebral infarction without residual deficits: Secondary | ICD-10-CM | POA: Diagnosis not present

## 2017-04-12 DIAGNOSIS — I6931 Attention and concentration deficit following cerebral infarction: Secondary | ICD-10-CM | POA: Diagnosis not present

## 2017-04-12 DIAGNOSIS — K219 Gastro-esophageal reflux disease without esophagitis: Secondary | ICD-10-CM | POA: Diagnosis not present

## 2017-04-12 DIAGNOSIS — Z95818 Presence of other cardiac implants and grafts: Secondary | ICD-10-CM | POA: Diagnosis not present

## 2017-04-12 DIAGNOSIS — H541213 Low vision right eye category 1, blindness left eye category 3: Secondary | ICD-10-CM | POA: Diagnosis not present

## 2017-04-12 DIAGNOSIS — I471 Supraventricular tachycardia: Secondary | ICD-10-CM | POA: Diagnosis not present

## 2017-04-12 DIAGNOSIS — I69398 Other sequelae of cerebral infarction: Secondary | ICD-10-CM | POA: Diagnosis not present

## 2017-04-12 DIAGNOSIS — I10 Essential (primary) hypertension: Secondary | ICD-10-CM | POA: Diagnosis not present

## 2017-04-12 DIAGNOSIS — K5790 Diverticulosis of intestine, part unspecified, without perforation or abscess without bleeding: Secondary | ICD-10-CM | POA: Diagnosis not present

## 2017-04-12 DIAGNOSIS — H1851 Endothelial corneal dystrophy: Secondary | ICD-10-CM | POA: Diagnosis not present

## 2017-04-14 ENCOUNTER — Telehealth: Payer: Self-pay | Admitting: Neurology

## 2017-04-14 ENCOUNTER — Other Ambulatory Visit: Payer: Self-pay

## 2017-04-14 NOTE — Patient Outreach (Addendum)
[facility]) Care Management  04/14/2017  [name] [name] Dec 31, 1924 [id number]   Assessment: 84 year old with history of right brain stroke, HTN, SVT, Funch's disease, sepsis. Discharged from inpatient rehabilitation 1/2-1/12.   RNCM called to complete transition of care. No answer. HIPPA compliant message left.  Plan: Await return call. Transition of care call next week if not return call.  [name], RN, MSN, [place] Coordinator Cell: [phone]

## 2017-04-14 NOTE — Patient Outreach (Signed)
[place] [facility]) Care Management  04/14/2017  Vondra Aldredge [birth year] [id number]   Subjective: "I cant see as much as I would like to".  Objective: none  Assessment:  84 year old with history of right brain stroke, HTN, SVT, Funch's disease, sepsis. Discharged from inpatient rehabilitation 1/2-1/12.   RNCM received return call from client's daughter. She reports client has been seen by cardiologist and primary care. She reports that client was started on prevacid for stomach issues and is concerned that it may be caused by the ASA. She reports that client has been weak and jittery since staring Prevacid. Ms. Kevan Ny states she has a call into the doctor and is awaiting a return call.  RNCM also spoke with Client who reports home health physical therapy and occupational therapy have started care.   Note: client received return call during this call and stated she would call RNCM back.  Plan: await return call and continue to follow. transition of care next week.  [name], RN, MSN, [place] Coordinator Cell: [phone]

## 2017-04-14 NOTE — Telephone Encounter (Signed)
Dr. Darcus Austin called concerning this patient, the patient still on aspirin and Plavix combination, she is having some stomach upset.  Okay to stop the aspirin.  She will remain on Plavix.

## 2017-04-21 ENCOUNTER — Other Ambulatory Visit: Payer: Self-pay

## 2017-04-21 NOTE — Patient Outreach (Signed)
[facility]) Care Management  04/21/2017  [name] [name] 08-18-1924 [id number]   Subjective: "I cant see to get around. It affected my eyes instead of my speech. Client reports she was told it may take 6 month to improve. "I have had a little change, but not much."  Objective: none  Assessment: 84 year old with history of right brain stroke (vision affected), HTN, SVT, Funch's disease, sepsis. Discharged from inpatient rehabilitation 1/2-1/12.  RNCM received return call. RNCM spoke with both client and her daughter, [name] [name]. [name] [name] reports she continue to have vision problems and is working with occupational therapist and speech therapist.   [name] [name] states clients aspirin was discontinued per neurologist and this has helped client's stomach issues some.   Daughter request follow up telephonically next week.  Plan: transition of care call next week.  [name] Silversmith, RN, MSN, [place] Coordinator Cell: [phone]

## 2017-04-21 NOTE — Patient Outreach (Signed)
[facility]) Care Management  04/21/2017  [name] [name] [birth year] [id number]   Assessment: 84 year old with history of right brain stroke, HTN, SVT, Funch's disease, sepsis. Discharged from inpatient rehabilitation 1/2-1/12.  RNCM called to complete transition of care call to [phone] and [phone]. No answer. HIPPA compliant message left.  Plan: await return call; follow up next week if no return call.  [name], RN, MSN, [place] Coordinator Cell: [phone]

## 2017-04-22 ENCOUNTER — Ambulatory Visit: Payer: PPO | Admitting: Cardiovascular Disease

## 2017-04-26 ENCOUNTER — Other Ambulatory Visit: Payer: Self-pay

## 2017-04-26 ENCOUNTER — Inpatient Hospital Stay: Payer: PPO | Admitting: Physical Medicine & Rehabilitation

## 2017-04-26 NOTE — Patient Outreach (Signed)
[facility]) Care Management  [place]  04/26/2017   [name] [name] July 15, 1924 [id number]  Subjective: "I think we are ok. We keep in contact with Dr. Inda Merlin and all".  Objective: none  Encounter Medications:  Outpatient Encounter Medications as of 04/26/2017  Medication Sig  . amLODipine (NORVASC) 5 MG tablet Take 1 tablet (5 mg total) by mouth daily with supper.  Marland Kitchen aspirin EC 81 MG EC tablet Take 1 tablet (81 mg total) by mouth daily.  . clopidogrel (PLAVIX) 75 MG tablet Take 1 tablet (75 mg total) by mouth daily.  . lansoprazole (PREVACID) 30 MG capsule Take 30 mg by mouth daily at 12 noon.  . Magnesium 250 MG TABS Take 250 mg by mouth at bedtime.  . Melatonin 1 MG SUBL Place 1 mg under the tongue at bedtime.  . Menthol-Methyl Salicylate (MUSCLE RUB) 10-15 % CREA Apply 1 application topically 3 (three) times daily as needed for muscle pain.  . Multiple Vitamin (MULTI VITAMIN DAILY PO) Take 1 tablet by mouth every morning.  . Multiple Vitamins-Minerals (PRESERVISION AREDS 2) CAPS Take 1 capsule by mouth daily after breakfast.  . nebivolol (BYSTOLIC) 2.5 MG tablet Take 1 tablet (2.5 mg total) by mouth daily. Take an additional 2.5 mg for systolic blood pressure (the top number) over 180.  . nitroGLYCERIN (NITROSTAT) 0.4 MG SL tablet Place 1 tablet (0.4 mg total) under the tongue every 5 (five) minutes as needed for chest pain.  Vladimir Faster Glycol-Propyl Glycol (SYSTANE ULTRA) 0.4-0.3 % SOLN Apply 1 drop to eye 2 (two) times daily as needed (for irritation).   Vladimir Faster Glycol-Propyl Glycol (SYSTANE) 0.4-0.3 % GEL ophthalmic gel Place 1 application into both eyes at bedtime.  . prednisoLONE acetate (PRED FORTE) 1 % ophthalmic suspension Place 1 drop into both eyes See admin instructions. 1 drop into both eyes at bedtime spaced 5 minutes apart from Systane gel  . ranitidine (ZANTAC) 150 MG tablet Take 1 tablet (150 mg total) by mouth at bedtime. (Patient not taking:  Reported on 04/14/2017)   No facility-administered encounter medications on file as of 04/26/2017.     Functional Status:  In your present state of health, do you have any difficulty performing the following activities: 04/21/2017 03/26/2017  Hearing? N -  Vision? Y -  Difficulty concentrating or making decisions? N -  Walking or climbing stairs? N N  Dressing or bathing? Y -  Doing errands, shopping? Y -  Some recent data might be hidden     Assessment: RNCM called to complete transition of care. Spoke with client's daughter, [name], primary caregiver reports client is not available at this time. She states that client was "ok and they were in contact with Dr. Inda Merlin.  Ms. Kevan Ny expresses the desire to discontinue program at this time. RNCM reinforced the purpose of the transition of care program and Ms. [name] expressed again "that they were ok".    RNCM reinforced that the 24 hour nurse advice line was still available and if client's needs change they could call or she has any questions she could call.   Plan: close case.  [name], RN, MSN, [place] Coordinator Cell: [phone]

## 2017-05-05 DIAGNOSIS — H3581 Retinal edema: Secondary | ICD-10-CM | POA: Diagnosis not present

## 2017-05-05 DIAGNOSIS — I639 Cerebral infarction, unspecified: Secondary | ICD-10-CM | POA: Diagnosis not present

## 2017-05-18 ENCOUNTER — Telehealth: Payer: Self-pay | Admitting: Cardiovascular Disease

## 2017-05-18 NOTE — Telephone Encounter (Signed)
New message    Pt c/o of Chest Pain: STAT if CP now or developed within 24 hours  1. Are you having CP right now? Yes  2. Are you experiencing any other symptoms (ex. SOB, nausea, vomiting, sweating)? no  3. How long have you been experiencing CP? Dull ache in chest started yesterday   4. Is your CP continuous or coming and going? Continuous   5. Have you taken Nitroglycerin? Yes - took 2 of them and it helped   bp started climbing up a couple days ago?

## 2017-05-18 NOTE — Telephone Encounter (Signed)
Patient and  Daughter [name] [name] (DPR on file) on the phone. Patient states that she started having a dull ache located in the center of her chest yesterday. She denies radiation, SOB, lightheadedness, dizziness, N/V, sweating, HAs or any other symptoms. She states that she took NTG x 2 yesterday and states that it eventually helped her to feel better. She states that her discomfort is better today and that her pain is 3/10 today. She states that she is more concerned with her BP being elevated. She states that it has been creeping up for the past several days. Patient's BP 173/69 HR 70 yesterday and BP 182/78 HR 69 today. Patient takes amlodipine 5 mg QD and bystolic 2.5 mg QD. Made patient aware that she can take extra bystolic 2.5 mg for systolics >774. Patient already has appointment with Dr. Burt Knack on Friday. Patient's daughter requesting patient be seen today or tomorrow. She states that she thinks if they wait until Friday the patient may have another stroke. She states that the patient is not having any stroke-like symptoms. Patient denies having any changes in vision or speech, weakness, or confusion. Offered to reschedule appointment for tomorrow with APP. She states that she wants her to see Dr. Burt Knack. Made her aware that Dr. Burt Knack is not in the office until Friday when the patient already has an appointment. She states that she will take the appointment tomorrow with Robbie Lis, PA at 10:00 AM. She states that if the patient's BP improves then she will call and cancel appointment. She states that she wants to keep appointment with Dr. Burt Knack on Friday. ER precautions reviewed.

## 2017-05-19 ENCOUNTER — Encounter: Payer: Self-pay | Admitting: Physician Assistant

## 2017-05-19 ENCOUNTER — Ambulatory Visit: Payer: PPO | Admitting: Physician Assistant

## 2017-05-19 VITALS — BP 118/68 | HR 75 | Ht 62.5 in | Wt 170.4 lb

## 2017-05-19 DIAGNOSIS — I471 Supraventricular tachycardia: Secondary | ICD-10-CM

## 2017-05-19 DIAGNOSIS — I10 Essential (primary) hypertension: Secondary | ICD-10-CM

## 2017-05-19 DIAGNOSIS — I2510 Atherosclerotic heart disease of native coronary artery without angina pectoris: Secondary | ICD-10-CM

## 2017-05-19 MED ORDER — PANTOPRAZOLE SODIUM 40 MG PO TBEC: 40.0000 mg | DELAYED_RELEASE_TABLET | Freq: Every day | ORAL | 3 refills | Status: DC

## 2017-05-19 NOTE — Progress Notes (Signed)
Cardiology Office Note    Date:  05/19/2017   ID:  [name] [name], DOB Aug 26, 1924, MRN [id number]  PCP:  [name] Austin, MD  Cardiologist:  Dr. Burt Knack  Chief Complaint: chest pain   History of Present Illness:   [name] [name] is a 84 y.o. female with hx of hypertension, SVT, CVA, and coronary artery disease added to my schedule for chest pain.   Patient had a non-ST elevation infarction in 2015 and she was treated conservatively. She returned with anginal chest pain in May 2017 and underwent stenting of severe stenosis in the ramus intermedius. She was noted to have moderate LAD stenosis with medical therapy recommended. FFR was done and was negative. Left circumflex is widely patent and dominant. LV function has been normal. She has had long-standing symptomatic palpitations.   Admitted 02/2017 with sudden vision loss in the right eye.  She was diagnosed with an acute right brainstem stroke.  Her symptoms improved and TPA was not administered.  has ILR.   Last seen by Dr. Burt Knack 04/08/17. Noted labile hypertension.   Added to my schedule for chest pain.  Patient episode no chest pain Monday evening.  She is unable to characterize pain.  She was sitting in a chair.  No associated shortness of breath, diaphoresis, nausea, vomiting or radiation.  She took sublingual nitroglycerin x 2 with very minimal improvement.  He eventually fall asleep.  Yesterday her pain gradually worsened.  Noted systolic blood pressure of 180.  She took extra by systolic with improvement of her symptoms but now are relieved.  At night she tried Tums with significant improvement.  She is still has a mild chest discomfort this morning.  Her aspirin discontinued by neurologist 3 weeks ago due to burning sensation on her stomach.  This pain is different than.  She unable to tell me that her symptoms is similar to prior angina when she has stenting.  No orthopnea, PND, syncope or lower extremity edema.   Past Medical History:    Diagnosis Date  . Arthritis    "thumbs, joints" (03/23/2017  . Basal cell carcinoma    "several; scattered over my face, hands, leg some cut off; some burned off"  . Blind left eye    a. Corneal transplant x3 with rejection  . CAD (coronary artery disease)    a. s/p NSTEMI 2015 treated conservatively; nuclear stress test low risk. b. Continued angina despite med rx 07/2015 - s/p io [facility] Stent to ramus intermedius with mod LAD stenosis neg by FFR.  . Complication of anesthesia    "very sensitive to RX"  . Diverticulitis large intestine   . Diverticulosis   . Family history of adverse reaction to anesthesia    "we all get PONV" (1/1/201)  . Gastritis   . GERD (gastroesophageal reflux disease)   . Hayfever   . Helicobacter pylori (H. pylori) 05/22/02   RUT-Positive  . Hypertension   . Myocardial infarction ([place]) 2015  . Squamous carcinoma    "several; scattered over my face, hands, leg some cut off; some burned off"  . Stroke [facility]) 2015   denies residual on 08/07/2015  . Stroke [facility]) 03/18/2017   "has effected her vision, balance, some memory" (03/23/2017)  . SVT (supraventricular tachycardia) ([facility])    a. seen by Dr. Caryl Comes - has loop recorder in. Patient has declined amiodarone due to side effect profile  . Varicose veins     Past Surgical History:  Procedure Laterality Date  . BASAL  CELL CARCINOMA EXCISION     "several; scattered over my face, hands, leg some cut off; some burned off"  . CARDIAC CATHETERIZATION N/A 08/07/2015   Procedure: Left Heart Cath and Coronary Angiography;  Surgeon: [name], MD;  Location: [place] [facility];  Service: Cardiovascular;  Laterality: N/A;  . CARDIAC CATHETERIZATION N/A 08/07/2015   Procedure: Coronary Stent Intervention;  Surgeon: [name], MD;  Location: [place] [facility];  Service: Cardiovascular;  Laterality: N/A;  . CARDIAC CATHETERIZATION N/A 08/07/2015   Procedure: Intravascular Pressure Wire/FFR Study;  Surgeon: [name], MD;  Location: [place] [facility];  Service: Cardiovascular;  Laterality: N/A;  . CATARACT EXTRACTION W/ INTRAOCULAR LENS  IMPLANT, BILATERAL Bilateral 2005  . CLOSED REDUCTION SHOULDER DISLOCATION Left 2016 X 2  . CORNEAL TRANSPLANT Bilateral right 2009, left 2009    3 in left eye (last 2 failed), 1 in right eye  . DILATION AND CURETTAGE OF UTERUS    . EYE SURGERY    . JOINT REPLACEMENT    . [place]   "had gangrene in it"  . LOOP RECORDER IMPLANT N/A 01/05/2014   Procedure: LOOP RECORDER IMPLANT;  Surgeon: Coralyn Mark, MD;  Location: [place] [facility];  Service: Cardiovascular;  Laterality: N/A;  . SQUAMOUS CELL CARCINOMA EXCISION     "several; scattered over my face, hands, leg some cut off; some burned off"  . TONSILLECTOMY    . TOTAL ABDOMINAL HYSTERECTOMY  11/1983   "ovaries and all"  . TOTAL KNEE ARTHROPLASTY Left 06/27/2012   Procedure: LEFT TOTAL KNEE ARTHROPLASTY;  Surgeon: Gearlean Alf, MD;  Location: [facility];  Service: Orthopedics;  Laterality: Left;  . TOTAL KNEE ARTHROPLASTY Right 12/12/2012   Procedure: RIGHT TOTAL KNEE ARTHROPLASTY;  Surgeon: Gearlean Alf, MD;  Location: [facility];  Service: Orthopedics;  Laterality: Right;  . TUBAL LIGATION  1966    Current Medications: Prior to Admission medications   Medication Sig Start Date End Date Taking? Authorizing Provider  amLODipine (NORVASC) 5 MG tablet Take 1 tablet (5 mg total) by mouth daily with supper. 04/08/17 04/03/18 Yes [name], MD  clopidogrel (PLAVIX) 75 MG tablet Take 1 tablet (75 mg total) by mouth daily. 04/02/17  Yes Love, Ivan Anchors, PA-C  Magnesium 250 MG TABS Take 250 mg by mouth at bedtime.   Yes [provider]  Melatonin 1 MG SUBL Place 1 mg under the tongue at bedtime.   Yes [provider]  Multiple Vitamin (MULTI VITAMIN DAILY PO) Take 1 tablet by mouth every morning.   Yes [provider]  Multiple Vitamins-Minerals (PRESERVISION AREDS 2)  CAPS Take 1 capsule by mouth daily after breakfast.   Yes [provider]  nebivolol (BYSTOLIC) 2.5 MG tablet Take 1 tablet (2.5 mg total) by mouth daily. Take an additional 2.5 mg for systolic blood pressure (the top number) over 180. 04/08/17 04/03/18 Yes [name], MD  nitroGLYCERIN (NITROSTAT) 0.4 MG SL tablet Place 1 tablet (0.4 mg total) under the tongue every 5 (five) minutes as needed for chest pain. 08/08/15  Yes Simmons, Brittainy M, PA-C  Polyethyl Glycol-Propyl Glycol (SYSTANE ULTRA) 0.4-0.3 % SOLN Apply 1 drop to eye 2 (two) times daily as needed (for irritation).    Yes [provider]  Polyethyl Glycol-Propyl Glycol (SYSTANE) 0.4-0.3 % GEL ophthalmic gel Place 1 application into both eyes at bedtime.   Yes [provider]  prednisoLONE acetate (PRED FORTE) 1 % ophthalmic suspension Place  1 drop into both eyes See admin instructions. 1 drop into both eyes at bedtime spaced 5 minutes apart from Systane gel   Yes [provider]  ranitidine (ZANTAC) 150 MG tablet Take 1 tablet (150 mg total) by mouth at bedtime. 11/06/16  Yes Armbruster, Carlota Raspberry, MD   Allergies:   Contrast media [iodinated diagnostic agents]; Fluorescein; Hydralazine hcl; Sulfa antibiotics; Ultram [tramadol]; Yellow dye; Buprenex [buprenorphine hcl]; Keflex [cephalexin]; Lipitor [atorvastatin]; Augmentin [amoxicillin-pot clavulanate]; Levofloxacin; Percocet [oxycodone-acetaminophen]; Tape; and Zetia [ezetimibe]   Social History   Socioeconomic History  . Marital status: Widowed    Spouse name: None  . Number of children: 3  . Years of education: None  . Highest education level: None  Social Needs  . Financial resource strain: None  . Food insecurity - worry: None  . Food insecurity - inability: None  . Transportation needs - medical: None  . Transportation needs - non-medical: None  Occupational History  . Occupation: retired  Tobacco Use  . Smoking status: Never Smoker    . Smokeless tobacco: Never Used  Substance and Sexual Activity  . Alcohol use: No  . Drug use: No  . Sexual activity: No  Other Topics Concern  . None  Social History Narrative   Patient is right handed   Patient lives alone.   Patient drinks 1 cup of coffee daily.     Family History:  The patient's family history includes Cancer in her brother; Heart attack in her brother, brother, brother, brother, and father; Parkinsonism in her brother; Stroke in her mother.   ROS:   Please see the history of present illness.    ROS All other systems reviewed and are negative.   PHYSICAL EXAM:   VS:  BP 118/68   Pulse 75   Ht 5' 2.5" (1.588 m)   Wt 170 lb 6.4 oz (77.3 kg)   SpO2 98%   BMI 30.67 kg/m    GEN: Well nourished, well developed, in no acute distress  HEENT: normal  Neck: no JVD, carotid bruits, or masses Cardiac: RRR; no murmurs, rubs, or gallops,no edema  Respiratory:  clear to auscultation bilaterally, normal work of breathing GI: soft, nontender, nondistended, + BS MS: no deformity or atrophy  Skin: warm and dry, no rash Neuro:  Alert and Oriented x 3, Strength and sensation are intact Psych: euthymic mood, full affect  Wt Readings from Last 3 Encounters:  05/19/17 170 lb 6.4 oz (77.3 kg)  04/08/17 169 lb 12.8 oz (77 kg)  03/25/17 176 lb 12.9 oz (80.2 kg)      Studies/Labs Reviewed:   EKG:  EKG is not  ordered today.   Recent Labs: 03/25/2017: ALT 38; BUN 13; Creatinine, Ser 0.61; Potassium 4.3; Sodium 138 03/31/2017: Hemoglobin 13.4; Platelets 181   Lipid Panel    Component Value Date/Time   CHOL 197 03/19/2017 0456   TRIG 86 03/19/2017 0456   HDL 62 03/19/2017 0456   CHOLHDL 3.2 03/19/2017 0456   VLDL 17 03/19/2017 0456   LDLCALC 118 (H) 03/19/2017 0456    Additional studies/ records that were reviewed today include:   Echocardiogram: 03/19/18 Study Conclusions  - Left ventricle: The cavity size was normal. Systolic function was   normal. The  estimated ejection fraction was in the range of 55%   to 60%. Left ventricular diastolic function parameters were   normal. - Aortic valve: Mildly thickened, moderately calcified leaflets.   There was mild regurgitation. - Mitral valve: There was  mild regurgitation. - Left atrium: The atrium was normal in size. - Right ventricle: The cavity size was normal. Wall thickness was   normal. Systolic function was normal. - Right atrium: The atrium was normal in size. - Tricuspid valve: There was mild regurgitation. - Pulmonic valve: There was no regurgitation. - Pericardium, extracardiac: The pericardium was normal in   appearance.  Impressions:     ASSESSMENT & PLAN:    1.  Chest pain -Minimally eased off with sublingual nitroglycerin however noted significant improvement after Tums last night.  She still has mild chest discomfort.  Aspirin discontinued by neurologist 3 weeks ago for burning sensation.  She denies blood in her stool or urine. -Her symptoms more consistent with a GI etiology.  She is unable to recall her prior angina when she had stenting.  Her symptoms does not excerbrated by minimal activity or laying down. -Continue Zantac.  Will add Protonix for trial basis.  Discussed evaluation with stress test with daughter who is present during office visit.  We both agree to defer evaluation until next office visit.  She will give [place] a call or go to ER if worsening of symptoms meanwhile.  2. CAD s/p stenting to [place] -As above.  3. Labile hypertension  -Blood pressure has been normal during office visit today.  Initial blood pressure 118/68.  Repeat check by my me 122/70.  No change in medication made today.  4. Recurrent stroke - Has loop recorder.  Continue Plavix.  Medication Adjustments/Labs and Tests Ordered: Current medicines are reviewed at length with the patient today.  Concerns regarding medicines are outlined above.  Medication changes, Labs and Tests ordered today  are listed in the Patient Instructions below. Patient Instructions   Medication Instructions:   START TAKING PROTONIX 40 MG ONCE A DAY   If you need a refill on your cardiac medications before your next appointment, please call your pharmacy.  Labwork: Your physician recommends that you continue on your current medications as directed. Please refer to the Current Medication list given to you today.    Testing/Procedures: NONE ORDERED  TODAY    Follow-Up:  WITH COPPER OR VIN IN 2 WEEKS   Any Other Special Instructions Will Be Listed Below (If Applicable).                                                                                                                                                      Jarrett Soho, [place]  05/19/2017 10:44 AM    [facility] [address], [place], [place]  [address] Phone: [phone]; Fax: [phone]

## 2017-05-19 NOTE — Patient Instructions (Addendum)
Medication Instructions:   START TAKING PROTONIX 40 MG ONCE A DAY   If you need a refill on your cardiac medications before your next appointment, please call your pharmacy.  Labwork: Your physician recommends that you continue on your current medications as directed. Please refer to the Current Medication list given to you today.    Testing/Procedures: NONE ORDERED  TODAY    Follow-Up:  WITH COPPER OR VIN IN 2 WEEKS   Any Other Special Instructions Will Be Listed Below (If Applicable).

## 2017-05-21 ENCOUNTER — Ambulatory Visit: Payer: PPO | Admitting: Cardiovascular Disease

## 2017-05-28 ENCOUNTER — Other Ambulatory Visit: Payer: Self-pay | Admitting: Cardiovascular Disease

## 2017-05-31 ENCOUNTER — Other Ambulatory Visit: Payer: Self-pay

## 2017-05-31 ENCOUNTER — Encounter: Payer: Self-pay | Admitting: Neurology

## 2017-05-31 ENCOUNTER — Ambulatory Visit (INDEPENDENT_AMBULATORY_CARE_PROVIDER_SITE_OTHER): Payer: PPO | Admitting: Neurology

## 2017-05-31 VITALS — BP 125/63 | HR 76 | Ht 62.5 in | Wt 175.0 lb

## 2017-05-31 DIAGNOSIS — I6302 Cerebral infarction due to thrombosis of basilar artery: Secondary | ICD-10-CM

## 2017-05-31 NOTE — Progress Notes (Signed)
Reason for visit: Cerebrovascular disease  Referring physician: [facility] Alfred is a 84 y.o. female  History of present illness:  Ms. Kloc is a 84 year old right-handed white female with a history of hypertension and supraventricular tachycardia.  The patient went into the hospital on 18 March 2017 with sudden loss of vision.  The patient also noted some numbness around the left mouth and in the left hand.  The patient was noted to have an acute or subacute right midbrain stroke.  CT angiogram of the head and neck showed an occlusion of the right posterior cerebral artery, distal stenosis of the P2 segment on the left.  The patient was placed on aspirin and Plavix after the stroke, she could not tolerate the aspirin as it caused stomach upset. The patient has stopped the 81 mg aspirin and she has remained on Plavix.  She was on Plavix at the time of the stroke.  The patient has continued to have a left face and left hand numbness, she has a left homonymous visual field deficit, but her vision was 20/80 corrected prior to the stroke event, and the patient is having a lot of difficulty seeing in general.  She indicates that her vision is as if she is looking through smoke.  The patient will be seen through neuro-ophthalmology at [facility] in the near future.  The patient denies any problems with speech or swallowing.  She has good strength in all 4 extremities.  She has had normal change in balance.  She comes to this office for an evaluation.  Past Medical History:  Diagnosis Date  . Arthritis    "thumbs, joints" (03/23/2017  . Basal cell carcinoma    "several; scattered over my face, hands, leg some cut off; some burned off"  . Blind left eye    a. Corneal transplant x3 with rejection  . CAD (coronary artery disease)    a. s/p NSTEMI 2015 treated conservatively; nuclear stress test low risk. b. Continued angina despite med rx 07/2015 - s/p io [place] Stent to ramus  intermedius with mod LAD stenosis neg by FFR.  . Complication of anesthesia    "very sensitive to RX"  . Diverticulitis large intestine   . Diverticulosis   . Family history of adverse reaction to anesthesia    "we all get PONV" (1/1/201)  . Gastritis   . GERD (gastroesophageal reflux disease)   . Hayfever   . Helicobacter pylori (H. pylori) 05/22/02   RUT-Positive  . Hypertension   . Myocardial infarction ([place]) 2015  . Squamous carcinoma    "several; scattered over my face, hands, leg some cut off; some burned off"  . Stroke [facility]) 2015   denies residual on 08/07/2015  . Stroke [facility]) 03/18/2017   "has effected her vision, balance, some memory" (03/23/2017)  . SVT (supraventricular tachycardia) (HCC)    a. seen by Dr. Caryl Comes - has loop recorder in. Patient has declined amiodarone due to side effect profile  . Varicose veins     Past Surgical History:  Procedure Laterality Date  . BASAL CELL CARCINOMA EXCISION     "several; scattered over my face, hands, leg some cut off; some burned off"  . CARDIAC CATHETERIZATION N/A 08/07/2015   Procedure: Left Heart Cath and Coronary Angiography;  Surgeon: [name], MD;  Location: [place] [facility];  Service: Cardiovascular;  Laterality: N/A;  . CARDIAC CATHETERIZATION N/A 08/07/2015   Procedure: Coronary Stent Intervention;  Surgeon: [name],  MD;  Location: [place] [facility];  Service: Cardiovascular;  Laterality: N/A;  . CARDIAC CATHETERIZATION N/A 08/07/2015   Procedure: Intravascular Pressure Wire/FFR Study;  Surgeon: [name], MD;  Location: [place] [facility];  Service: Cardiovascular;  Laterality: N/A;  . CATARACT EXTRACTION W/ INTRAOCULAR LENS  IMPLANT, BILATERAL Bilateral 2005  . CLOSED REDUCTION SHOULDER DISLOCATION Left 2016 X 2  . CORNEAL TRANSPLANT Bilateral right 2009, left 2009    3 in left eye (last 2 failed), 1 in right eye  . DILATION AND CURETTAGE OF UTERUS    . EYE SURGERY    . JOINT REPLACEMENT    .  [place]   "had gangrene in it"  . LOOP RECORDER IMPLANT N/A 01/05/2014   Procedure: LOOP RECORDER IMPLANT;  Surgeon: Coralyn Mark, MD;  Location: [place] [facility];  Service: Cardiovascular;  Laterality: N/A;  . SQUAMOUS CELL CARCINOMA EXCISION     "several; scattered over my face, hands, leg some cut off; some burned off"  . TONSILLECTOMY    . TOTAL ABDOMINAL HYSTERECTOMY  11/1983   "ovaries and all"  . TOTAL KNEE ARTHROPLASTY Left 06/27/2012   Procedure: LEFT TOTAL KNEE ARTHROPLASTY;  Surgeon: Gearlean Alf, MD;  Location: [facility];  Service: Orthopedics;  Laterality: Left;  . TOTAL KNEE ARTHROPLASTY Right 12/12/2012   Procedure: RIGHT TOTAL KNEE ARTHROPLASTY;  Surgeon: Gearlean Alf, MD;  Location: [facility];  Service: Orthopedics;  Laterality: Right;  . TUBAL LIGATION  1966    Family History  Problem Relation Age of Onset  . Stroke Mother   . Heart attack Father   . Heart attack Brother   . Cancer Brother   . Heart attack Brother   . Heart attack Brother   . Heart attack Brother   . Parkinsonism Brother     Social history:  reports that  has never smoked. she has never used smokeless tobacco. She reports that she does not drink alcohol or use drugs.  Medications:  Prior to Admission medications   Medication Sig Start Date End Date Taking? Authorizing Provider  amLODipine (NORVASC) 5 MG tablet Take 1 tablet (5 mg total) by mouth daily with supper. 04/08/17 04/03/18  [name], MD  clopidogrel (PLAVIX) 75 MG tablet Take 1 tablet (75 mg total) by mouth daily. 04/02/17   Love, Ivan Anchors, PA-C  Magnesium 250 MG TABS Take 250 mg by mouth at bedtime.    [provider]  Melatonin 1 MG SUBL Place 1 mg under the tongue at bedtime.    [provider]  Multiple Vitamin (MULTI VITAMIN DAILY PO) Take 1 tablet by mouth every morning.    [provider]  Multiple Vitamins-Minerals (PRESERVISION AREDS 2) CAPS Take 1 capsule by mouth daily after  breakfast.    [provider]  nebivolol (BYSTOLIC) 2.5 MG tablet Take 1 tablet (2.5 mg total) by mouth daily. Take an additional 2.5 mg for systolic blood pressure (the top number) over 180. 04/08/17 04/03/18  [name], MD  nitroGLYCERIN (NITROSTAT) 0.4 MG SL tablet Place 1 tablet (0.4 mg total) under the tongue every 5 (five) minutes as needed for chest pain. 08/08/15   Lyda Jester M, PA-C  pantoprazole (PROTONIX) 40 MG tablet Take 1 tablet (40 mg total) by mouth daily. 05/19/17   Bhagat, Crista Luria, PA  Polyethyl Glycol-Propyl Glycol (SYSTANE ULTRA) 0.4-0.3 % SOLN Apply 1 drop to eye 2 (two) times daily as needed (for irritation).     [provider]  Polyethyl Glycol-Propyl Glycol (SYSTANE) 0.4-0.3 % GEL ophthalmic gel Place 1 application into both eyes at bedtime.    [provider]  prednisoLONE acetate (PRED FORTE) 1 % ophthalmic suspension Place 1 drop into both eyes See admin instructions. 1 drop into both eyes at bedtime spaced 5 minutes apart from Systane gel    [provider]  ranitidine (ZANTAC) 150 MG tablet Take 1 tablet (150 mg total) by mouth at bedtime. 11/06/16   Armbruster, Carlota Raspberry, MD      Allergies  Allergen Reactions  . Contrast Media [Iodinated Diagnostic Agents] Anaphylaxis  . Fluorescein Anaphylaxis, Shortness Of Breath, Itching and Swelling  . Hydralazine Hcl Anaphylaxis, Swelling, Palpitations and Rash  . Sulfa Antibiotics Rash and Other (See Comments)    Other Reaction: red bumps  . Ultram [Tramadol] Hives  . Yellow Dye Anaphylaxis, Itching, Swelling and Other (See Comments)    Fluorescein (in eye drops)  . Buprenex [Buprenorphine Hcl] Palpitations and Other (See Comments)  . Keflex [Cephalexin] Diarrhea  . Lipitor [Atorvastatin] Other (See Comments)    Muscle weakness  . Augmentin [Amoxicillin-Pot Clavulanate] Nausea And Vomiting  . Levofloxacin Other (See Comments)    Reaction not recalled  . Percocet  [Oxycodone-Acetaminophen] Nausea And Vomiting  . Tape Other (See Comments)    Adhesive tape pulls off the skin  . Zetia [Ezetimibe] Other (See Comments)    Myalgias    ROS:  Out of a complete 14 system review of symptoms, the patient complains only of the following symptoms, and all other reviewed systems are negative.  Chest pain Ringing in the ears Blurred vision, loss of vision Flushing Joint pain Numbness  Height 5' 2.5" (1.588 m), weight 175 lb (79.4 kg).  Physical Exam  General: The patient is alert and cooperative at the time of the examination.  Eyes: Pupils are equal, round, and reactive to light. Discs are flat bilaterally.  Neck: The neck is supple, no carotid bruits are noted.  Respiratory: The respiratory examination is clear.  Cardiovascular: The cardiovascular examination reveals a regular rate and rhythm, no obvious murmurs or rubs are noted.  Skin: Extremities are without significant edema.  Neurologic Exam  Mental status: The patient is alert and oriented x 3 at the time of the examination. The patient has apparent normal recent and remote memory, with an apparently normal attention span and concentration ability.  Cranial nerves: Facial symmetry is present. There is good sensation of the face to pinprick and soft touch bilaterally. The strength of the facial muscles and the muscles to head turning and shoulder shrug are normal bilaterally. Speech is well enunciated, no aphasia or dysarthria is noted. Extraocular movements are full. Visual fields are notable for left homonymous visual field deficit with the right eye, the patient is blind in the left eye.. The tongue is midline, and the patient has symmetric elevation of the soft palate. No obvious hearing deficits are noted.  Motor: The motor testing reveals 5 over 5 strength of all 4 extremities. Good symmetric motor tone is noted throughout.  Sensory: Sensory testing is intact to pinprick, soft touch,  vibration sensation, and position sense on all 4 extremities. No evidence of extinction is noted.  Coordination: Cerebellar testing reveals good finger-nose-finger and heel-to-shin bilaterally.  Gait and station: Gait is minimally wide-based.  The patient can walk independently.  Tandem gait was not attempted.  Romberg is negative. No drift is seen.  Reflexes: Deep tendon reflexes are symmetric and normal bilaterally.  Toes are downgoing bilaterally.   MRI brain 03/18/17:  IMPRESSION: 1. 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 No associated hemorrhage or mass effect. 2. Moderate chronic microvascular ischemic changes and moderate parenchymal volume loss of the brain. Small chronic cortical infarcts are present in bilateral parietal and left frontal lobes.  * MRI scan images were reviewed online. I agree with the written report.   CTA head and neck 03/18/17:  IMPRESSION: 1. New short segment occlusion of the right PCA at its origin. 2. New severe distal left P2 stenosis. 3. No large vessel occlusion or significant proximal stenosis in the anterior circulation. 4. Widely patent cervical carotid and vertebral arteries. 5.  Aortic Atherosclerosis (ICD10-I70.0).   2D echo 03/19/17:  Study Conclusions  - Left ventricle: The cavity size was normal. Systolic function was   normal. The estimated ejection fraction was in the range of 55%   to 60%. Left ventricular diastolic function parameters were   normal. - Aortic valve: Mildly thickened, moderately calcified leaflets.   There was mild regurgitation. - Mitral valve: There was mild regurgitation. - Left atrium: The atrium was normal in size. - Right ventricle: The cavity size was normal. Wall thickness was   normal. Systolic function was normal. - Right atrium: The atrium was normal in size. - Tricuspid valve: There was mild regurgitation. - Pulmonic  valve: There was no regurgitation. - Pericardium, extracardiac: The pericardium was normal in   appearance.  Impressions:  - No cardiac source of emboli was indentified.   Assessment/Plan:  1.  Right midbrain stroke by MRI  2.  Visual field deficit, left homonymous  The right midbrain stroke in itself does not explain the visual field deficit.  The patient had a right posterior cerebral artery occlusion by CT angiogram, the patient may have extended a stroke into the right posterior cerebral artery distribution which would have resulted in a left homonymous visual field deficit.  The patient could not tolerate the aspirin and Plavix combination, she has come off of aspirin and she feels better.  The patient is monitoring her blood pressures, they have done better lately, she was having issues with labile blood pressures previously.  The patient will be seen through neuro-ophthalmology, I have indicated to the patient that her visual field deficit is not likely to fully clear.  She lives in a 3 level home, I am not sure that this is safe for her to live in independently.  I have recommended an independent living apartment at this time.  The patient will follow-up through this office if needed.  Jill Alexanders MD 05/31/2017 2:39 PM  [facility] [address] [place], [place] [address]  Phone [phone] Fax [phone]

## 2017-06-03 DIAGNOSIS — H00024 Hordeolum internum left upper eyelid: Secondary | ICD-10-CM | POA: Diagnosis not present

## 2017-06-05 DIAGNOSIS — S81812A Laceration without foreign body, left lower leg, initial encounter: Secondary | ICD-10-CM | POA: Diagnosis not present

## 2017-06-08 DIAGNOSIS — S81812D Laceration without foreign body, left lower leg, subsequent encounter: Secondary | ICD-10-CM | POA: Diagnosis not present

## 2017-06-18 ENCOUNTER — Ambulatory Visit: Payer: PPO | Admitting: Cardiovascular Disease

## 2017-06-24 DIAGNOSIS — H543 Unqualified visual loss, both eyes: Secondary | ICD-10-CM | POA: Diagnosis not present

## 2017-06-24 DIAGNOSIS — H53462 Homonymous bilateral field defects, left side: Secondary | ICD-10-CM | POA: Diagnosis not present

## 2017-06-24 DIAGNOSIS — H538 Other visual disturbances: Secondary | ICD-10-CM | POA: Diagnosis not present

## 2017-06-24 DIAGNOSIS — H1852 Epithelial (juvenile) corneal dystrophy: Secondary | ICD-10-CM | POA: Diagnosis not present

## 2017-06-24 DIAGNOSIS — Z961 Presence of intraocular lens: Secondary | ICD-10-CM | POA: Diagnosis not present

## 2017-06-24 DIAGNOSIS — H44522 Atrophy of globe, left eye: Secondary | ICD-10-CM | POA: Diagnosis not present

## 2017-06-24 DIAGNOSIS — H35371 Puckering of macula, right eye: Secondary | ICD-10-CM | POA: Diagnosis not present

## 2017-07-01 ENCOUNTER — Ambulatory Visit: Payer: PPO | Admitting: Cardiovascular Disease

## 2017-07-25 ENCOUNTER — Encounter: Payer: Self-pay | Admitting: Cardiovascular Disease

## 2017-07-26 ENCOUNTER — Telehealth: Payer: Self-pay | Admitting: Cardiovascular Disease

## 2017-07-26 NOTE — Telephone Encounter (Signed)
Patient's daughter did not read MyChart message before returning call.  She states the patient has been extremely tired lately and her BP has been running lower than usual. It has been around 120/60 when it usually is around 130s/70s. She requests to decrease amlodipine to 2.5 mg nightly. Informed her it is fine to decrease the medication. She understands to call if symptoms do not improve on decreased dose. Scheduled patient for overdue OV 5/31 with Dr. Burt Knack.  Patient's daughter was grateful for call and agrees with treatment plan.

## 2017-07-26 NOTE — Telephone Encounter (Signed)
New Message   Pt c/o BP issue:  1. What are your last 5 BP readings? 120/60 2. Are you having any other symptoms (ex. Dizziness, headache, blurred vision, passed out)? Very weak and fatigue. Patients states that her mother stayed in bed this weekend.  3. What is your medication issue? Patient daughter wants to know can her mother adjust or stop taking the amlodipine or the bystolic. Please call to discuss.

## 2017-07-28 ENCOUNTER — Telehealth: Payer: Self-pay | Admitting: Cardiovascular Disease

## 2017-07-28 MED ORDER — AMLODIPINE BESYLATE 2.5 MG PO TABS: 2.5000 mg | ORAL_TABLET | Freq: Every day | ORAL | 3 refills | Status: DC

## 2017-07-28 NOTE — Telephone Encounter (Signed)
New Message   Pt c/o medication issue:  1. Name of Medication: amLODipine (NORVASC) 5 MG tablet  2. How are you currently taking this medication (dosage and times per day)? Take 2.5 mg by mouth at bedtime  3. Are you having a reaction (difficulty breathing--STAT)? no  4. What is your medication issue? Pt's daughter is calling to see if she can get a prescription for the 2.5 mg instead of cutting the pill she has in half due to it crumpling and not cutting  And also would like to know when the weakness with start to subside. Please call

## 2017-07-28 NOTE — Telephone Encounter (Signed)
Patient is feeling a little better taking amlodipine 2.5 mg nightly. Discussed with Dr. Burt Knack - new Rx called in. [name] notified. She will call next week if patient is still fatigued. She was grateful for assistance.

## 2017-08-02 DIAGNOSIS — R350 Frequency of micturition: Secondary | ICD-10-CM | POA: Diagnosis not present

## 2017-08-09 ENCOUNTER — Encounter: Payer: Self-pay | Admitting: Cardiovascular Disease

## 2017-08-10 ENCOUNTER — Telehealth: Payer: Self-pay | Admitting: Cardiovascular Disease

## 2017-08-10 ENCOUNTER — Ambulatory Visit: Payer: PPO | Admitting: Physician Assistant

## 2017-08-10 ENCOUNTER — Ambulatory Visit (INDEPENDENT_AMBULATORY_CARE_PROVIDER_SITE_OTHER): Payer: PPO | Admitting: *Deleted

## 2017-08-10 ENCOUNTER — Encounter: Payer: Self-pay | Admitting: *Deleted

## 2017-08-10 ENCOUNTER — Encounter: Payer: Self-pay | Admitting: Physician Assistant

## 2017-08-10 VITALS — BP 120/66 | HR 87 | Ht 62.5 in | Wt 171.8 lb

## 2017-08-10 DIAGNOSIS — I25119 Atherosclerotic heart disease of native coronary artery with unspecified angina pectoris: Secondary | ICD-10-CM | POA: Diagnosis not present

## 2017-08-10 DIAGNOSIS — I10 Essential (primary) hypertension: Secondary | ICD-10-CM | POA: Diagnosis not present

## 2017-08-10 DIAGNOSIS — I471 Supraventricular tachycardia: Secondary | ICD-10-CM | POA: Diagnosis not present

## 2017-08-10 DIAGNOSIS — R5383 Other fatigue: Secondary | ICD-10-CM

## 2017-08-10 DIAGNOSIS — I639 Cerebral infarction, unspecified: Secondary | ICD-10-CM

## 2017-08-10 LAB — CUP PACEART INCLINIC DEVICE CHECK
Date Time Interrogation Session: 20190521164332
EVALUATION RHYTHM: 75
MDC IDC PG IMPLANT DT: 20151016

## 2017-08-10 MED ORDER — NEBIVOLOL HCL 2.5 MG PO TABS: 2.5000 mg | ORAL_TABLET | Freq: Two times a day (BID) | ORAL | 3 refills | Status: DC

## 2017-08-10 NOTE — Patient Instructions (Signed)
Medication Instructions:   1. Hold Norvasc until further advised by cardiology.  2. Increase Bystolic to 2.5 mg twice daily.  Labwork: Today: MAGNESIUM, BMET, CBC, TSH   Testing/Procedures: Your physician has requested that you have a lexiscan myoview. For further information please visit [email or web address]. Please follow instruction sheet, as given.    Follow-Up: Keep appointment with Dr. Burt Knack on 08-20-17.  Any Other Special Instructions Will Be Listed Below (If Applicable).  1. Check BP once a day. Record and bring to next office visit.  2. Call if blood pressure consistently 165/85 or higher. [phone]   If you need a refill on your cardiac medications before your next appointment, please call your pharmacy.

## 2017-08-10 NOTE — Telephone Encounter (Signed)
Follow Up:    Daughter says pt will be coming for her appt today.

## 2017-08-10 NOTE — Progress Notes (Signed)
Add in on clinic with [name] [name]. S/p stroke. Loop check in clinic. Battery status: EOS since 04/11/2017. R-waves 0.27mV. 0symptom episodes, 92 tachy episodes - hx of SVT, 0pause episodes, 0 brady episodes. 4AF episodes (<1% burden) - available EGMs show PACs

## 2017-08-10 NOTE — Progress Notes (Signed)
Cardiology Office Note:    Date:  08/10/2017   ID:  [name] [name], DOB 1924-07-03, MRN [id number]  PCP:  Darcus Austin, MD  Cardiologist:  [name], MD   Referring MD: Darcus Austin, MD   Chief Complaint  Patient presents with  . Palpitations  . Chest Pain    History of Present Illness:    [name] [name] is a 84 y.o. female with coronary artery disease, SVT, hypertension, recurrent stroke, s/p ILR.  She suffered a NSTEMI in 2015 and was treated conservatively.  She had recurrent angina in 07/2015 and was treated with stenting of the RI.  She had residual moderate LAD stenosis that was not hemodynamically significant by FFR and this was treated medically.  She was admitted December 2018 with a right brainstem stroke.  She has persistent visual field deficits. Her loop recorder has been negative for atrial fibrillation.  She saw Dr. Burt Knack in January 2019 after discharge from the hospital.  She was last seen in clinic by [facility], PA-C 05/19/17 for chest pain.  Her symptoms seemed to be consistent with a GI etiology.  She has been taken off of ASA due to dyspepsia.  She recently contacted our office due to symptomatic hypotension.  Her amlodipine dose was reduced.    [name] [name] returns for evaluation of weakness and palpitations.  She is here with her daughter.  Last week, she experienced 2 episodes of palpitations.  One episode awoke her in the middle of the night.  She has had some discomfort in her chest that she describes as a "hot" feeling.  She denies exertional symptoms.  She does note dyspnea with exertion.  She feels that this is worse since her stroke.  She denies syncope, orthopnea, PND or significant pedal edema.  Overall, she has felt fatigued.  She sometimes feels better after eating.  Her daughter notes that she sometimes has breakfast in the morning and goes back to bed.  She denies any fevers, cough, bleeding.  Prior CV studies:   The following studies were reviewed  today:  Carotid US 03/19/2017 Final Interpretation: Right Carotid: There is evidence in the right ICA of a 1-39% stenosis. Left Carotid: There is evidence in the left ICA of a 1-39% stenosis. Vertebrals: Both vertebral arteries were patent with antegrade flow.  Echo 03/11/2017 EF 07-37, normal diastolic function, mild AI, mild MR, mild TR  Cardiac catheterization 08/07/2015 LAD mid 65 (FFR 0.85) RI ostial 90 PCI: 2.5 x 14 mm BioFreedom Study Stent to the ostial RI  Myoview 11/15 1. No reversible ischemia or infarction. 2. Normal left ventricular wall motion. 3. Left ventricular ejection fraction 74% 4. Low-risk stress test findings*.  Echo 11/15 Normal LVF, no RWMA, mild AI  Carotid US 10/15 Bilateral ICA 1-39%  Abd [place] 6/14 No AAA   Past Medical History:  Diagnosis Date  . Arthritis    "thumbs, joints" (03/23/2017  . Basal cell carcinoma    "several; scattered over my face, hands, leg some cut off; some burned off"  . Blind left eye    a. Corneal transplant x3 with rejection  . CAD (coronary artery disease)    a. s/p NSTEMI 2015 treated conservatively; nuclear stress test low risk. b. Continued angina despite med rx 07/2015 - s/p io Freedom Stent to ramus intermedius with mod LAD stenosis neg by FFR.  . Complication of anesthesia    "very sensitive to RX"  . Diverticulitis large intestine   . Diverticulosis   .  Family history of adverse reaction to anesthesia    "we all get PONV" (1/1/201)  . Gastritis   . GERD (gastroesophageal reflux disease)   . Hayfever   . Helicobacter pylori (H. pylori) 05/22/02   RUT-Positive  . Hypertension   . Myocardial infarction ([place]) 2015  . Squamous carcinoma    "several; scattered over my face, hands, leg some cut off; some burned off"  . Stroke [facility]) 2015   denies residual on 08/07/2015  . Stroke [facility]) 03/18/2017   "has effected her vision, balance, some memory" (03/23/2017)  . SVT (supraventricular tachycardia) (HCC)    a.  seen by Dr. Caryl Comes - has loop recorder in. Patient has declined amiodarone due to side effect profile  . Varicose veins    Surgical Hx: The patient  has a past surgical history that includes Corneal transplant (Bilateral, right 2009, left 2009); Cataract extraction w/ intraocular lens  implant, bilateral (Bilateral, 2005); Total abdominal hysterectomy (11/1983); Total knee arthroplasty (Left, 06/27/2012); Total knee arthroplasty (Right, 12/12/2012); loop recorder implant (N/A, 01/05/2014); Eye surgery; Joint replacement; Tonsillectomy; Laparoscopic cholecystectomy (3335); Closed reduction shoulder dislocation (Left, 2016 X 2); Excision basal cell carcinoma; Squamous cell carcinoma excision; Tubal ligation (1966); Dilation and curettage of uterus; Cardiac catheterization (N/A, 08/07/2015); Cardiac catheterization (N/A, 08/07/2015); and Cardiac catheterization (N/A, 08/07/2015).   Current Medications: Current Meds  Medication Sig  . amLODipine (NORVASC) 2.5 MG tablet Take 1 tablet (2.5 mg total) by mouth at bedtime.  . clopidogrel (PLAVIX) 75 MG tablet Take 1 tablet (75 mg total) by mouth daily.  . Magnesium 250 MG TABS Take 250 mg by mouth at bedtime.  . Melatonin 1 MG SUBL Place 1 mg under the tongue at bedtime.  . Multiple Vitamin (MULTI VITAMIN DAILY PO) Take 1 tablet by mouth every morning.  . Multiple Vitamins-Minerals (PRESERVISION AREDS 2) CAPS Take 1 capsule by mouth daily after breakfast.  . nitroGLYCERIN (NITROSTAT) 0.4 MG SL tablet Place 1 tablet (0.4 mg total) under the tongue every 5 (five) minutes as needed for chest pain.  Vladimir Faster Glycol-Propyl Glycol (SYSTANE ULTRA) 0.4-0.3 % SOLN Apply 1 drop to eye 2 (two) times daily as needed (for irritation).   Vladimir Faster Glycol-Propyl Glycol (SYSTANE) 0.4-0.3 % GEL ophthalmic gel Place 1 application into both eyes at bedtime.  . prednisoLONE acetate (PRED FORTE) 1 % ophthalmic suspension Place 1 drop into both eyes See admin instructions. 1 drop  into both eyes at bedtime spaced 5 minutes apart from Systane gel  . ranitidine (ZANTAC) 150 MG tablet Take 1 tablet (150 mg total) by mouth at bedtime.  . [DISCONTINUED] nebivolol (BYSTOLIC) 2.5 MG tablet Take 1 tablet (2.5 mg total) by mouth daily. Take an additional 2.5 mg for systolic blood pressure (the top number) over 180.     Allergies:   Contrast media [iodinated diagnostic agents]; Fluorescein; Hydralazine hcl; Sulfa antibiotics; Ultram [tramadol]; Yellow dye; Buprenex [buprenorphine hcl]; Keflex [cephalexin]; Lipitor [atorvastatin]; Augmentin [amoxicillin-pot clavulanate]; Levofloxacin; Percocet [oxycodone-acetaminophen]; Tape; and Zetia [ezetimibe]   Social History   Tobacco Use  . Smoking status: Never Smoker  . Smokeless tobacco: Never Used  Substance Use Topics  . Alcohol use: No  . Drug use: No     Family Hx: The patient's family history includes Cancer in her brother; Heart attack in her brother, brother, brother, brother, and father; Parkinsonism in her brother; Stroke in her mother.  ROS:   Please see the history of present illness.    Review of Systems  Constitution:  Positive for malaise/fatigue.  Cardiovascular: Positive for palpitations.   All other systems reviewed and are negative.   EKGs/Labs/Other Test Reviewed:    EKG:  EKG is  ordered today.  The ekg ordered today demonstrates normal sinus rhythm, heart rate 75, normal axis, QTC 431, similar to prior tracing dated 04/29/2016  Recent Labs: 03/25/2017: ALT 38; BUN 13; Creatinine, Ser 0.61; Potassium 4.3; Sodium 138 03/31/2017: Hemoglobin 13.4; Platelets 181   Recent Lipid Panel Lab Results  Component Value Date/Time   CHOL 197 03/19/2017 04:56 AM   TRIG 86 03/19/2017 04:56 AM   HDL 62 03/19/2017 04:56 AM   CHOLHDL 3.2 03/19/2017 04:56 AM   LDLCALC 118 (H) 03/19/2017 04:56 AM    Physical Exam:    VS:  BP 120/66   Pulse 87   Ht 5' 2.5" (1.588 m)   Wt 171 lb 12 oz (77.9 kg)   SpO2 97%   BMI 30.91  kg/m     Wt Readings from Last 3 Encounters:  08/10/17 171 lb 12 oz (77.9 kg)  05/31/17 175 lb (79.4 kg)  05/19/17 170 lb 6.4 oz (77.3 kg)     Physical Exam  Constitutional: She is oriented to person, place, and time. She appears well-developed and well-nourished. No distress.  HENT:  Head: Normocephalic and atraumatic.  Neck: Neck supple.  Cardiovascular: Normal rate, regular rhythm, S1 normal, S2 normal and normal heart sounds.  No murmur heard. Pulmonary/Chest: Effort normal. She has no rales.  Abdominal: Soft. There is no hepatomegaly.  Musculoskeletal: She exhibits no edema.  Neurological: She is alert and oriented to person, place, and time.  Skin: Skin is warm and dry.    ASSESSMENT & PLAN:    SVT (supraventricular tachycardia) (HCC) Her loop recorder is at end-of-life.  However, we were able to interrogate it today.  On the date in question, she did have several episodes of tachycardia.  This appears to be supraventricular tachycardia.  Heart rate was about 150.  No atrial fibrillation was detected.  I suspect her palpitations were consistent with paroxysmal supraventricular tachycardia.  -Increase Bystolic to 2.5 mg twice daily  -Obtain BMET, magnesium, TSH  Coronary artery disease involving native coronary artery of native heart with angina pectoris [facility]) She is status post stenting to the RI in May 2017.  She did have residual disease in LAD with moderate stenosis.  This was negative by FFR.  She describes occasional chest pain.  She also describes fatigue.  Question if her symptoms may be anginal.  I have recommended proceeding with stress testing.  If her lab work demonstrates significant anemia or significant hypothyroidism, we can cancel her stress test.  Continue Plavix.  She is intolerant of statins and Zetia.  -Arrange a Lexiscan Myoview  Essential hypertension She typically feels better when her blood pressure is 150s over 70s.  She has had significant issues  since starting on amlodipine.  Her dose of amlodipine was recently reduced.  As I am increasing her beta-blocker dose, I will have her stop amlodipine for now.  -DC Amlodipine  Other fatigue Proceed with stress testing as noted.  Obtain labs today: BMET, magnesium, CBC, TSH.  If she has significant metabolic abnormalities, we can cancel her stress test.  -BMET, Mg2+, TSH, CBC   Dispo:  Return in about 10 days (around 08/20/2017) for Scheduled Follow Up, w/ Dr. Burt Knack.   Medication Adjustments/Labs and Tests Ordered: Current medicines are reviewed at length with the patient today.  Concerns regarding medicines  are outlined above.  Tests Ordered: Orders Placed This Encounter  Procedures  . Magnesium  . Basic metabolic panel  . CBC  . TSH  . MYOCARDIAL PERFUSION IMAGING  . EKG 12-Lead   Medication Changes: Meds ordered this encounter  Medications  . nebivolol (BYSTOLIC) 2.5 MG tablet    Sig: Take 1 tablet (2.5 mg total) by mouth 2 (two) times daily.    Dispense:  180 tablet    Refill:  3    Dose increase.    Signed, Richardson Dopp, PA-C  08/10/2017 4:39 PM    [facility] [place], [place], [place]  [address] Phone: [phone]; Fax: [phone]

## 2017-08-10 NOTE — Telephone Encounter (Signed)
Attempted to call both the patient's daughter and son on DPR form.  Left messages to call back ASAP to check on patient and to confirm or cancel appointment today.

## 2017-08-11 ENCOUNTER — Telehealth (HOSPITAL_COMMUNITY): Payer: Self-pay | Admitting: *Deleted

## 2017-08-11 ENCOUNTER — Telehealth: Payer: Self-pay | Admitting: *Deleted

## 2017-08-11 LAB — BASIC METABOLIC PANEL
BUN / CREAT RATIO: 28 (ref 12–28)
BUN: 20 mg/dL (ref 10–36)
CO2: 25 mmol/L (ref 20–29)
Calcium: 9 mg/dL (ref 8.7–10.3)
Chloride: 101 mmol/L (ref 96–106)
Creatinine, Ser: 0.71 mg/dL (ref 0.57–1.00)
GFR calc non Af Amer: 75 mL/min/{1.73_m2} (ref >59)
GFR, EST AFRICAN AMERICAN: 87 mL/min/{1.73_m2} (ref >59)
Glucose: 102 mg/dL — ABNORMAL HIGH (ref 65–99)
Potassium: 4.6 mmol/L (ref 3.5–5.2)
Sodium: 140 mmol/L (ref 134–144)

## 2017-08-11 LAB — CBC
HEMATOCRIT: 42.4 % (ref 34.0–46.6)
HEMOGLOBIN: 14.2 g/dL (ref 11.1–15.9)
MCH: 30.9 pg (ref 26.6–33.0)
MCHC: 33.5 g/dL (ref 31.5–35.7)
MCV: 92 fL (ref 79–97)
Platelets: 231 10*3/uL (ref 150–450)
RBC: 4.59 x10E6/uL (ref 3.77–5.28)
RDW: 13.4 % (ref 12.3–15.4)
WBC: 6.3 10*3/uL (ref 3.4–10.8)

## 2017-08-11 LAB — TSH: TSH: 2.32 u[IU]/mL (ref 0.450–4.500)

## 2017-08-11 LAB — MAGNESIUM: MAGNESIUM: 2.3 mg/dL (ref 1.6–2.3)

## 2017-08-11 NOTE — Telephone Encounter (Signed)
-----   Message from Liliane Shi, [place] sent at 08/11/2017  1:37 PM EDT ----- Magnesium normal.  Renal function, potassium normal.  Hemoglobin normal.  TSH normal. PLAN:  Continue current medications and follow up as planned.  Richardson Dopp, PA-C    08/11/2017 1:36 PM

## 2017-08-11 NOTE — Telephone Encounter (Signed)
Left message to go over lab results.  

## 2017-08-11 NOTE — Telephone Encounter (Signed)
Follow Up:; ° ° °Returning your call. °

## 2017-08-11 NOTE — Telephone Encounter (Signed)
Left message on voicemail per DPR in reference to upcoming appointment scheduled on 08/13/17 at 8:45 with detailed instructions given per Myocardial Perfusion Study Information Sheet for the test. LM to arrive 15 minutes early, and that it is imperative to arrive on time for appointment to keep from having the test rescheduled. If you need to cancel or reschedule your appointment, please call the office within 24 hours of your appointment. Failure to do so may result in a cancellation of your appointment, and a $50 no show fee. Phone number given for call back for any questions.

## 2017-08-11 NOTE — Telephone Encounter (Signed)
DPR ok to s/w pt's daughter [name] who has been notified of lab results for the pt by phone today with verbal understanding. Advised to continue with current Tx plan at this time. Daughter Renie Ora me for the call. I will forward a copy of results to DR. Darcus Austin as Juluis Rainier. Pt has stress test scheduled for 08/13/17.

## 2017-08-13 ENCOUNTER — Encounter: Payer: Self-pay | Admitting: Physician Assistant

## 2017-08-13 ENCOUNTER — Ambulatory Visit (HOSPITAL_COMMUNITY): Payer: PPO | Attending: Cardiovascular Disease

## 2017-08-13 DIAGNOSIS — I25119 Atherosclerotic heart disease of native coronary artery with unspecified angina pectoris: Secondary | ICD-10-CM | POA: Diagnosis not present

## 2017-08-13 DIAGNOSIS — I471 Supraventricular tachycardia: Secondary | ICD-10-CM

## 2017-08-13 LAB — MYOCARDIAL PERFUSION IMAGING
CHL CUP RESTING HR STRESS: 68 {beats}/min
LVDIAVOL: 88 mL (ref 46–106)
LVSYSVOL: 29 mL
Peak HR: 93 {beats}/min
RATE: 0.35
SDS: 4
SRS: 8
SSS: 11
TID: 0.96

## 2017-08-13 MED ORDER — TECHNETIUM TC 99M TETROFOSMIN IV KIT
31.6000 | PACK | Freq: Once | INTRAVENOUS | Status: AC | PRN
  Administered 2017-08-13: 31.6 via INTRAVENOUS
  Filled 2017-08-13: qty 32

## 2017-08-13 MED ORDER — REGADENOSON 0.4 MG/5ML IV SOLN
0.4000 mg | Freq: Once | INTRAVENOUS | Status: AC
  Administered 2017-08-13: 0.4 mg via INTRAVENOUS

## 2017-08-13 MED ORDER — TECHNETIUM TC 99M TETROFOSMIN IV KIT
10.2000 | PACK | Freq: Once | INTRAVENOUS | Status: AC | PRN
  Administered 2017-08-13: 10.2 via INTRAVENOUS
  Filled 2017-08-13: qty 11

## 2017-08-20 ENCOUNTER — Encounter: Payer: Self-pay | Admitting: Cardiovascular Disease

## 2017-08-20 ENCOUNTER — Ambulatory Visit: Payer: PPO | Admitting: Cardiovascular Disease

## 2017-08-20 VITALS — BP 134/70 | HR 75 | Ht 62.5 in | Wt 173.8 lb

## 2017-08-20 DIAGNOSIS — I471 Supraventricular tachycardia: Secondary | ICD-10-CM

## 2017-08-20 DIAGNOSIS — I2510 Atherosclerotic heart disease of native coronary artery without angina pectoris: Secondary | ICD-10-CM | POA: Diagnosis not present

## 2017-08-20 NOTE — Patient Instructions (Signed)
Medication Instructions:  Your provider recommends that you continue on your current medications as directed. Please refer to the Current Medication list given to you today.    Labwork: None  Testing/Procedures: None  Follow-Up: You have an appointment with Dr. Burt Knack on December 01, 2017 at 3:20PM.  Any Other Special Instructions Will Be Listed Below (If Applicable).     If you need a refill on your cardiac medications before your next appointment, please call your pharmacy.

## 2017-08-20 NOTE — Progress Notes (Signed)
Cardiology Office Note Date:  08/20/2017   ID:  [name] [name], DOB Nov 08, 1924, MRN [id number]  PCP:  Darcus Austin, MD  Cardiologist:  [name], MD    Chief Complaint  Patient presents with  . Palpitations  . Fatigue   History of Present Illness: [name] [name] is a 84 y.o. female who presents for follow-up of coronary artery disease, SVT, and hypertension.  She is here with her daughter today.  States that she feels "lousy."  She has had trouble with marked fatigue and weakness on an intermittent basis.  She was seen recently by Richardson Dopp who adjusted her medicines and she is had some improvement since that time.  Bisoprolol was doubled and her palpitations have improved.  She still feels very fatigued.  She brings in home blood pressure readings with a range of 131-174/57-74.  Heart rate is been in the 70s.  She has not had any recent chest pain.  She does complain of shortness of breath with activity.  No edema, orthopnea, or PND.   Past Medical History:  Diagnosis Date  . Arthritis    "thumbs, joints" (03/23/2017  . Basal cell carcinoma    "several; scattered over my face, hands, leg some cut off; some burned off"  . Blind left eye    a. Corneal transplant x3 with rejection  . CAD (coronary artery disease)    a. s/p NSTEMI 2015 treated conservatively; nuclear stress test low risk. b. Continued angina despite med rx 07/2015 - s/p Bio Freedom Stent to ramus intermedius with mod LAD stenosis neg by FFR. // Nuc study 5/19:  EF 67, no ischemia or scar; Low Risk   . Complication of anesthesia    "very sensitive to RX"  . Diverticulitis large intestine   . Diverticulosis   . Family history of adverse reaction to anesthesia    "we all get PONV" (1/1/201)  . Gastritis   . GERD (gastroesophageal reflux disease)   . Hayfever   . Helicobacter pylori (H. pylori) 05/22/02   RUT-Positive  . Hypertension   . Myocardial infarction ([place]) 2015  . Squamous carcinoma    "several;  scattered over my face, hands, leg some cut off; some burned off"  . Stroke [facility]) 2015   denies residual on 08/07/2015  . Stroke [facility]) 03/18/2017   "has effected her vision, balance, some memory" (03/23/2017)  . SVT (supraventricular tachycardia) (HCC)    a. seen by Dr. Caryl Comes - has loop recorder in. Patient has declined amiodarone due to side effect profile  . Varicose veins     Past Surgical History:  Procedure Laterality Date  . BASAL CELL CARCINOMA EXCISION     "several; scattered over my face, hands, leg some cut off; some burned off"  . CARDIAC CATHETERIZATION N/A 08/07/2015   Procedure: Left Heart Cath and Coronary Angiography;  Surgeon: [name], MD;  Location: [place] CV LAB;  Service: Cardiovascular;  Laterality: N/A;  . CARDIAC CATHETERIZATION N/A 08/07/2015   Procedure: Coronary Stent Intervention;  Surgeon: [name], MD;  Location: [place] CV LAB;  Service: Cardiovascular;  Laterality: N/A;  . CARDIAC CATHETERIZATION N/A 08/07/2015   Procedure: Intravascular Pressure Wire/FFR Study;  Surgeon: [name], MD;  Location: [place] CV LAB;  Service: Cardiovascular;  Laterality: N/A;  . CATARACT EXTRACTION W/ INTRAOCULAR LENS  IMPLANT, BILATERAL Bilateral 2005  . CLOSED REDUCTION SHOULDER DISLOCATION Left 2016 X 2  . CORNEAL TRANSPLANT Bilateral right 2009, left 2009    3 in  left eye (last 2 failed), 1 in right eye  . DILATION AND CURETTAGE OF UTERUS    . EYE SURGERY    . JOINT REPLACEMENT    . [place]   "had gangrene in it"  . LOOP RECORDER IMPLANT N/A 01/05/2014   Procedure: LOOP RECORDER IMPLANT;  Surgeon: Coralyn Mark, MD;  Location: [place] CATH LAB;  Service: Cardiovascular;  Laterality: N/A;  . SQUAMOUS CELL CARCINOMA EXCISION     "several; scattered over my face, hands, leg some cut off; some burned off"  . TONSILLECTOMY    . TOTAL ABDOMINAL HYSTERECTOMY  11/1983   "ovaries and all"  . TOTAL KNEE ARTHROPLASTY Left 06/27/2012    Procedure: LEFT TOTAL KNEE ARTHROPLASTY;  Surgeon: Gearlean Alf, MD;  Location: [facility];  Service: Orthopedics;  Laterality: Left;  . TOTAL KNEE ARTHROPLASTY Right 12/12/2012   Procedure: RIGHT TOTAL KNEE ARTHROPLASTY;  Surgeon: Gearlean Alf, MD;  Location: [facility];  Service: Orthopedics;  Laterality: Right;  . TUBAL LIGATION  1966    Current Outpatient Medications  Medication Sig Dispense Refill  . clopidogrel (PLAVIX) 75 MG tablet Take 1 tablet (75 mg total) by mouth daily. 30 tablet 0  . Magnesium 250 MG TABS Take 250 mg by mouth at bedtime.    . Melatonin 1 MG SUBL Place 1 mg under the tongue at bedtime.    . Multiple Vitamin (MULTI VITAMIN DAILY PO) Take 1 tablet by mouth every morning.    . Multiple Vitamins-Minerals (PRESERVISION AREDS 2) CAPS Take 1 capsule by mouth daily after breakfast.    . nebivolol (BYSTOLIC) 2.5 MG tablet Take 1 tablet (2.5 mg total) by mouth 2 (two) times daily. 180 tablet 3  . nitroGLYCERIN (NITROSTAT) 0.4 MG SL tablet Place 1 tablet (0.4 mg total) under the tongue every 5 (five) minutes as needed for chest pain. 25 tablet 2  . Polyethyl Glycol-Propyl Glycol (SYSTANE ULTRA) 0.4-0.3 % SOLN Apply 1 drop to eye 2 (two) times daily as needed (for irritation).     Vladimir Faster Glycol-Propyl Glycol (SYSTANE) 0.4-0.3 % GEL ophthalmic gel Place 1 application into both eyes at bedtime.    . prednisoLONE acetate (PRED FORTE) 1 % ophthalmic suspension Place 1 drop into both eyes See admin instructions. 1 drop into both eyes at bedtime spaced 5 minutes apart from Systane gel    . ranitidine (ZANTAC) 150 MG tablet Take 1 tablet (150 mg total) by mouth at bedtime. 90 tablet 3   No current facility-administered medications for this visit.     Allergies:   Contrast media [iodinated diagnostic agents]; Fluorescein; Hydralazine hcl; Sulfa antibiotics; Ultram [tramadol]; Yellow dye; Buprenex [buprenorphine hcl]; Keflex [cephalexin]; Lipitor [atorvastatin]; Augmentin  [amoxicillin-pot clavulanate]; Levofloxacin; Percocet [oxycodone-acetaminophen]; Tape; and Zetia [ezetimibe]   Social History:  The patient  reports that she has never smoked. She has never used smokeless tobacco. She reports that she does not drink alcohol or use drugs.   Family History:  The patient's  family history includes Cancer in her brother; Heart attack in her brother, brother, brother, brother, and father; Parkinsonism in her brother; Stroke in her mother.    ROS:  Please see the history of present illness.  Otherwise, review of systems is positive for hearing loss, vision loss, nausea, fatigue.  All other systems are reviewed and negative.    PHYSICAL EXAM: VS:  BP 134/70   Pulse 75   Ht 5' 2.5" (1.588 m)   Wt 173 lb 12.8  oz (78.8 kg)   SpO2 92%   BMI 31.28 kg/m  , BMI Body mass index is 31.28 kg/m. GEN: Pleasant elderly woman, in no acute distress  HEENT: normal  Neck: no JVD, no masses. No carotid bruits Cardiac: RRR without murmur or gallop                Respiratory:  clear to auscultation bilaterally, normal work of breathing GI: soft, nontender, nondistended, + BS MS: no deformity or atrophy  Ext: no pretibial edema, pedal pulses 2+= bilaterally Skin: warm and dry, no rash Neuro:  Strength and sensation are intact Psych: euthymic mood, full affect  EKG:  EKG is not ordered today.  Recent Labs: 03/25/2017: ALT 38 08/10/2017: BUN 20; Creatinine, Ser 0.71; Hemoglobin 14.2; Magnesium 2.3; Platelets 231; Potassium 4.6; Sodium 140; TSH 2.320   Lipid Panel     Component Value Date/Time   CHOL 197 03/19/2017 0456   TRIG 86 03/19/2017 0456   HDL 62 03/19/2017 0456   CHOLHDL 3.2 03/19/2017 0456   VLDL 17 03/19/2017 0456   LDLCALC 118 (H) 03/19/2017 0456      Wt Readings from Last 3 Encounters:  08/20/17 173 lb 12.8 oz (78.8 kg)  08/10/17 171 lb 12 oz (77.9 kg)  05/31/17 175 lb (79.4 kg)     Cardiac Studies Reviewed: Myocardial perfusion scan  08/13/2017: Study Highlights     Nuclear stress EF: 67%.  There was no ST segment deviation noted during stress.  The study is normal.  This is a low risk study.  The left ventricular ejection fraction is hyperdynamic (>65%).   Normal resting and stress perfusion. No ischemia or infarction EF 67%     ASSESSMENT AND PLAN: 1.  Paroxysmal SVT: The patient has had this documented on loop recorder data.  At the time of her recent visit, Bystolic was increased to 2.5 mg twice daily.  She has deferred a trial of amiodarone because of concern about side effects.  Seems improved with change of Bystolic.  Continue without further dose adjustment at this time.  She seems to be symptomatic over the years when her blood pressure is in the low normal range.  Need to avoid hypotension.  I discussed adequate fluid hydration with her at length today.  2.  Coronary artery disease, native vessel, with angina: Treated with clopidogrel alone.  Aspirin caused dyspepsia.  Normal nuclear perfusion study as outlined above.  This is reviewed today.  3.  Labile hypertension.  Blood pressure is reasonably controlled based on her home readings as well as her office reading today.  No changes recommended.  Current medicines are reviewed with the patient today.  The patient does not have concerns regarding medicines.  Labs/ tests ordered today include:  No orders of the defined types were placed in this encounter.   Disposition:   FU 3 months  Signed, [name], MD  08/20/2017 4:41 PM    [facility] [place], [place], [place]  [address] Phone: [phone]; Fax: [phone]

## 2017-08-23 ENCOUNTER — Other Ambulatory Visit: Payer: Self-pay | Admitting: Cardiovascular Disease

## 2017-08-23 DIAGNOSIS — H04129 Dry eye syndrome of unspecified lacrimal gland: Secondary | ICD-10-CM | POA: Diagnosis not present

## 2017-08-23 DIAGNOSIS — H5340 Unspecified visual field defects: Secondary | ICD-10-CM | POA: Diagnosis not present

## 2017-08-23 DIAGNOSIS — H40013 Open angle with borderline findings, low risk, bilateral: Secondary | ICD-10-CM | POA: Diagnosis not present

## 2017-08-23 DIAGNOSIS — H35031 Hypertensive retinopathy, right eye: Secondary | ICD-10-CM | POA: Diagnosis not present

## 2017-08-23 DIAGNOSIS — H353112 Nonexudative age-related macular degeneration, right eye, intermediate dry stage: Secondary | ICD-10-CM | POA: Diagnosis not present

## 2017-08-23 DIAGNOSIS — H35371 Puckering of macula, right eye: Secondary | ICD-10-CM | POA: Diagnosis not present

## 2017-08-23 DIAGNOSIS — H1852 Epithelial (juvenile) corneal dystrophy: Secondary | ICD-10-CM | POA: Diagnosis not present

## 2017-08-25 ENCOUNTER — Encounter: Payer: Self-pay | Admitting: *Deleted

## 2017-08-25 DIAGNOSIS — Z006 Encounter for examination for normal comparison and control in clinical research program: Secondary | ICD-10-CM

## 2017-08-25 NOTE — Progress Notes (Signed)
LEADERS [facility] II Research study month 24 telephone follow up completed. Patient has been doing well recently had OV with Dr. Burt Knack. She denies any adverse events since her CVA. I reviewed medications and she has stopped her Norvasc since I last spoke with her. I thanked her for her continued participation. Her next research required visit will be due between 05/23/18-09/20/18.

## 2017-09-01 DIAGNOSIS — H5440 Blindness, one eye, unspecified eye: Secondary | ICD-10-CM | POA: Diagnosis not present

## 2017-09-01 DIAGNOSIS — Z961 Presence of intraocular lens: Secondary | ICD-10-CM | POA: Diagnosis not present

## 2017-09-01 DIAGNOSIS — H04121 Dry eye syndrome of right lacrimal gland: Secondary | ICD-10-CM | POA: Diagnosis not present

## 2017-09-01 DIAGNOSIS — H538 Other visual disturbances: Secondary | ICD-10-CM | POA: Diagnosis not present

## 2017-09-01 DIAGNOSIS — H1852 Epithelial (juvenile) corneal dystrophy: Secondary | ICD-10-CM | POA: Diagnosis not present

## 2017-09-01 DIAGNOSIS — H5347 Heteronymous bilateral field defects: Secondary | ICD-10-CM | POA: Diagnosis not present

## 2017-09-10 DIAGNOSIS — H44522 Atrophy of globe, left eye: Secondary | ICD-10-CM | POA: Diagnosis not present

## 2017-09-10 DIAGNOSIS — H1851 Endothelial corneal dystrophy: Secondary | ICD-10-CM | POA: Diagnosis not present

## 2017-09-21 ENCOUNTER — Encounter (HOSPITAL_COMMUNITY): Payer: Self-pay | Admitting: Emergency Medicine

## 2017-09-21 ENCOUNTER — Emergency Department (HOSPITAL_COMMUNITY): Payer: PPO

## 2017-09-21 ENCOUNTER — Other Ambulatory Visit: Payer: Self-pay

## 2017-09-21 ENCOUNTER — Inpatient Hospital Stay (HOSPITAL_COMMUNITY)
Admission: EM | Admit: 2017-09-21 | Discharge: 2017-09-30 | DRG: 064 | Disposition: A | Discharge status: Home / Self Care | Payer: PPO | Attending: Internal Medicine | Admitting: Internal Medicine

## 2017-09-21 DIAGNOSIS — R404 Transient alteration of awareness: Secondary | ICD-10-CM | POA: Diagnosis not present

## 2017-09-21 DIAGNOSIS — H547 Unspecified visual loss: Secondary | ICD-10-CM | POA: Diagnosis not present

## 2017-09-21 DIAGNOSIS — Z9841 Cataract extraction status, right eye: Secondary | ICD-10-CM

## 2017-09-21 DIAGNOSIS — G40409 Other generalized epilepsy and epileptic syndromes, not intractable, without status epilepticus: Secondary | ICD-10-CM | POA: Diagnosis not present

## 2017-09-21 DIAGNOSIS — R519 Headache, unspecified: Secondary | ICD-10-CM | POA: Diagnosis present

## 2017-09-21 DIAGNOSIS — I471 Supraventricular tachycardia: Secondary | ICD-10-CM | POA: Diagnosis present

## 2017-09-21 DIAGNOSIS — R29810 Facial weakness: Secondary | ICD-10-CM | POA: Diagnosis present

## 2017-09-21 DIAGNOSIS — R402352 Coma scale, best motor response, localizes pain, at arrival to emergency department: Secondary | ICD-10-CM | POA: Diagnosis present

## 2017-09-21 DIAGNOSIS — I69391 Dysphagia following cerebral infarction: Secondary | ICD-10-CM | POA: Diagnosis not present

## 2017-09-21 DIAGNOSIS — R402142 Coma scale, eyes open, spontaneous, at arrival to emergency department: Secondary | ICD-10-CM | POA: Diagnosis present

## 2017-09-21 DIAGNOSIS — Z961 Presence of intraocular lens: Secondary | ICD-10-CM | POA: Diagnosis not present

## 2017-09-21 DIAGNOSIS — I351 Nonrheumatic aortic (valve) insufficiency: Secondary | ICD-10-CM | POA: Diagnosis not present

## 2017-09-21 DIAGNOSIS — I63431 Cerebral infarction due to embolism of right posterior cerebral artery: Principal | ICD-10-CM | POA: Diagnosis present

## 2017-09-21 DIAGNOSIS — R9401 Abnormal electroencephalogram [EEG]: Secondary | ICD-10-CM | POA: Diagnosis present

## 2017-09-21 DIAGNOSIS — I480 Paroxysmal atrial fibrillation: Secondary | ICD-10-CM

## 2017-09-21 DIAGNOSIS — G4700 Insomnia, unspecified: Secondary | ICD-10-CM | POA: Diagnosis not present

## 2017-09-21 DIAGNOSIS — I639 Cerebral infarction, unspecified: Secondary | ICD-10-CM | POA: Diagnosis present

## 2017-09-21 DIAGNOSIS — I10 Essential (primary) hypertension: Secondary | ICD-10-CM | POA: Diagnosis not present

## 2017-09-21 DIAGNOSIS — R4182 Altered mental status, unspecified: Secondary | ICD-10-CM | POA: Diagnosis not present

## 2017-09-21 DIAGNOSIS — I110 Hypertensive heart disease with heart failure: Secondary | ICD-10-CM | POA: Diagnosis not present

## 2017-09-21 DIAGNOSIS — H5347 Heteronymous bilateral field defects: Secondary | ICD-10-CM | POA: Diagnosis present

## 2017-09-21 DIAGNOSIS — R569 Unspecified convulsions: Secondary | ICD-10-CM | POA: Diagnosis not present

## 2017-09-21 DIAGNOSIS — E876 Hypokalemia: Secondary | ICD-10-CM | POA: Diagnosis not present

## 2017-09-21 DIAGNOSIS — H548 Legal blindness, as defined in USA: Secondary | ICD-10-CM | POA: Diagnosis not present

## 2017-09-21 DIAGNOSIS — I6602 Occlusion and stenosis of left middle cerebral artery: Secondary | ICD-10-CM | POA: Diagnosis not present

## 2017-09-21 DIAGNOSIS — I6310 Cerebral infarction due to embolism of unspecified precerebral artery: Secondary | ICD-10-CM

## 2017-09-21 DIAGNOSIS — G9389 Other specified disorders of brain: Secondary | ICD-10-CM | POA: Diagnosis present

## 2017-09-21 DIAGNOSIS — I5189 Other ill-defined heart diseases: Secondary | ICD-10-CM

## 2017-09-21 DIAGNOSIS — K219 Gastro-esophageal reflux disease without esophagitis: Secondary | ICD-10-CM | POA: Diagnosis not present

## 2017-09-21 DIAGNOSIS — Z882 Allergy status to sulfonamides status: Secondary | ICD-10-CM

## 2017-09-21 DIAGNOSIS — Z823 Family history of stroke: Secondary | ICD-10-CM

## 2017-09-21 DIAGNOSIS — I69320 Aphasia following cerebral infarction: Secondary | ICD-10-CM | POA: Diagnosis not present

## 2017-09-21 DIAGNOSIS — Z91041 Radiographic dye allergy status: Secondary | ICD-10-CM

## 2017-09-21 DIAGNOSIS — I6529 Occlusion and stenosis of unspecified carotid artery: Secondary | ICD-10-CM | POA: Diagnosis present

## 2017-09-21 DIAGNOSIS — Z515 Encounter for palliative care: Secondary | ICD-10-CM | POA: Diagnosis not present

## 2017-09-21 DIAGNOSIS — G9341 Metabolic encephalopathy: Secondary | ICD-10-CM | POA: Diagnosis not present

## 2017-09-21 DIAGNOSIS — I6300 Cerebral infarction due to thrombosis of unspecified precerebral artery: Secondary | ICD-10-CM | POA: Diagnosis not present

## 2017-09-21 DIAGNOSIS — H179 Unspecified corneal scar and opacity: Secondary | ICD-10-CM | POA: Diagnosis not present

## 2017-09-21 DIAGNOSIS — R402232 Coma scale, best verbal response, inappropriate words, at arrival to emergency department: Secondary | ICD-10-CM | POA: Diagnosis present

## 2017-09-21 DIAGNOSIS — R29714 NIHSS score 14: Secondary | ICD-10-CM | POA: Diagnosis present

## 2017-09-21 DIAGNOSIS — Z96653 Presence of artificial knee joint, bilateral: Secondary | ICD-10-CM | POA: Diagnosis present

## 2017-09-21 DIAGNOSIS — R531 Weakness: Secondary | ICD-10-CM | POA: Diagnosis not present

## 2017-09-21 DIAGNOSIS — Z85828 Personal history of other malignant neoplasm of skin: Secondary | ICD-10-CM

## 2017-09-21 DIAGNOSIS — I2510 Atherosclerotic heart disease of native coronary artery without angina pectoris: Secondary | ICD-10-CM | POA: Diagnosis not present

## 2017-09-21 DIAGNOSIS — Z8249 Family history of ischemic heart disease and other diseases of the circulatory system: Secondary | ICD-10-CM

## 2017-09-21 DIAGNOSIS — R52 Pain, unspecified: Secondary | ICD-10-CM | POA: Diagnosis not present

## 2017-09-21 DIAGNOSIS — R262 Difficulty in walking, not elsewhere classified: Secondary | ICD-10-CM | POA: Diagnosis not present

## 2017-09-21 DIAGNOSIS — Z7901 Long term (current) use of anticoagulants: Secondary | ICD-10-CM

## 2017-09-21 DIAGNOSIS — I63412 Cerebral infarction due to embolism of left middle cerebral artery: Secondary | ICD-10-CM | POA: Diagnosis not present

## 2017-09-21 DIAGNOSIS — G40309 Generalized idiopathic epilepsy and epileptic syndromes, not intractable, without status epilepticus: Secondary | ICD-10-CM | POA: Diagnosis not present

## 2017-09-21 DIAGNOSIS — Z888 Allergy status to other drugs, medicaments and biological substances status: Secondary | ICD-10-CM

## 2017-09-21 DIAGNOSIS — Z8679 Personal history of other diseases of the circulatory system: Secondary | ICD-10-CM | POA: Diagnosis not present

## 2017-09-21 DIAGNOSIS — I6340 Cerebral infarction due to embolism of unspecified cerebral artery: Secondary | ICD-10-CM | POA: Diagnosis present

## 2017-09-21 DIAGNOSIS — K5900 Constipation, unspecified: Secondary | ICD-10-CM | POA: Diagnosis not present

## 2017-09-21 DIAGNOSIS — R41841 Cognitive communication deficit: Secondary | ICD-10-CM | POA: Diagnosis not present

## 2017-09-21 DIAGNOSIS — Z885 Allergy status to narcotic agent status: Secondary | ICD-10-CM

## 2017-09-21 DIAGNOSIS — H5450 Low vision, one eye, unspecified eye: Secondary | ICD-10-CM | POA: Diagnosis not present

## 2017-09-21 DIAGNOSIS — I509 Heart failure, unspecified: Secondary | ICD-10-CM | POA: Diagnosis not present

## 2017-09-21 DIAGNOSIS — R51 Headache: Secondary | ICD-10-CM | POA: Diagnosis not present

## 2017-09-21 DIAGNOSIS — E785 Hyperlipidemia, unspecified: Secondary | ICD-10-CM | POA: Diagnosis not present

## 2017-09-21 DIAGNOSIS — Z7902 Long term (current) use of antithrombotics/antiplatelets: Secondary | ICD-10-CM

## 2017-09-21 DIAGNOSIS — Z111 Encounter for screening for respiratory tuberculosis: Secondary | ICD-10-CM | POA: Diagnosis not present

## 2017-09-21 DIAGNOSIS — M6281 Muscle weakness (generalized): Secondary | ICD-10-CM | POA: Diagnosis not present

## 2017-09-21 DIAGNOSIS — Z91048 Other nonmedicinal substance allergy status: Secondary | ICD-10-CM

## 2017-09-21 DIAGNOSIS — I25119 Atherosclerotic heart disease of native coronary artery with unspecified angina pectoris: Secondary | ICD-10-CM | POA: Diagnosis not present

## 2017-09-21 DIAGNOSIS — I252 Old myocardial infarction: Secondary | ICD-10-CM

## 2017-09-21 DIAGNOSIS — Z9842 Cataract extraction status, left eye: Secondary | ICD-10-CM

## 2017-09-21 DIAGNOSIS — R1312 Dysphagia, oropharyngeal phase: Secondary | ICD-10-CM | POA: Diagnosis not present

## 2017-09-21 DIAGNOSIS — R279 Unspecified lack of coordination: Secondary | ICD-10-CM | POA: Diagnosis not present

## 2017-09-21 DIAGNOSIS — Z743 Need for continuous supervision: Secondary | ICD-10-CM | POA: Diagnosis not present

## 2017-09-21 LAB — URINALYSIS, ROUTINE W REFLEX MICROSCOPIC
Bilirubin Urine: NEGATIVE
Glucose, UA: NEGATIVE mg/dL
Hgb urine dipstick: NEGATIVE
Ketones, ur: 20 mg/dL — AB
Leukocytes, UA: NEGATIVE
Nitrite: NEGATIVE
Protein, ur: NEGATIVE mg/dL
Specific Gravity, Urine: 1.013 (ref 1.005–1.030)
pH: 7 (ref 5.0–8.0)

## 2017-09-21 LAB — COMPREHENSIVE METABOLIC PANEL
ALT: 25 U/L (ref 0–44)
AST: 31 U/L (ref 15–41)
Albumin: 3.9 g/dL (ref 3.5–5.0)
Alkaline Phosphatase: 94 U/L (ref 38–126)
Anion gap: 11 (ref 5–15)
BUN: 11 mg/dL (ref 8–23)
CO2: 26 mmol/L (ref 22–32)
Calcium: 9 mg/dL (ref 8.9–10.3)
Chloride: 105 mmol/L (ref 98–111)
Creatinine, Ser: 0.65 mg/dL (ref 0.44–1.00)
GFR calc Af Amer: >60 mL/min (ref >60)
GFR calc non Af Amer: >60 mL/min (ref >60)
Glucose, Bld: 125 mg/dL — ABNORMAL HIGH (ref 70–99)
Potassium: 4.2 mmol/L (ref 3.5–5.1)
Sodium: 142 mmol/L (ref 135–145)
Total Bilirubin: 0.7 mg/dL (ref 0.3–1.2)
Total Protein: 6.5 g/dL (ref 6.5–8.1)

## 2017-09-21 LAB — CBC
HCT: 46 % (ref 36.0–46.0)
Hemoglobin: 14.5 g/dL (ref 12.0–15.0)
MCH: 30.3 pg (ref 26.0–34.0)
MCHC: 31.5 g/dL (ref 30.0–36.0)
MCV: 96.2 fL (ref 78.0–100.0)
Platelets: 197 10*3/uL (ref 150–400)
RBC: 4.78 MIL/uL (ref 3.87–5.11)
RDW: 13 % (ref 11.5–15.5)
WBC: 5.3 10*3/uL (ref 4.0–10.5)

## 2017-09-21 LAB — CBG MONITORING, ED: GLUCOSE-CAPILLARY: 116 mg/dL — AB (ref 70–99)

## 2017-09-21 LAB — DIFFERENTIAL
Abs Immature Granulocytes: 0 10*3/uL (ref 0.0–0.1)
Basophils Absolute: 0 10*3/uL (ref 0.0–0.1)
Basophils Relative: 1 %
Eosinophils Absolute: 0 10*3/uL (ref 0.0–0.7)
Eosinophils Relative: 0 %
Immature Granulocytes: 0 %
Lymphocytes Relative: 12 %
Lymphs Abs: 0.7 10*3/uL (ref 0.7–4.0)
Monocytes Absolute: 0.4 10*3/uL (ref 0.1–1.0)
Monocytes Relative: 8 %
Neutro Abs: 4.2 10*3/uL (ref 1.7–7.7)
Neutrophils Relative %: 79 %

## 2017-09-21 LAB — RAPID URINE DRUG SCREEN, HOSP PERFORMED
Amphetamines: NOT DETECTED
Benzodiazepines: NOT DETECTED
Cocaine: NOT DETECTED
Opiates: NOT DETECTED
Tetrahydrocannabinol: NOT DETECTED

## 2017-09-21 LAB — APTT: aPTT: 27 seconds (ref 24–36)

## 2017-09-21 LAB — I-STAT CHEM 8, ED
BUN: 13 mg/dL (ref 8–23)
Calcium, Ion: 1.1 mmol/L — ABNORMAL LOW (ref 1.15–1.40)
Chloride: 102 mmol/L (ref 98–111)
Creatinine, Ser: 0.6 mg/dL (ref 0.44–1.00)
Glucose, Bld: 121 mg/dL — ABNORMAL HIGH (ref 70–99)
HCT: 44 % (ref 36.0–46.0)
Hemoglobin: 15 g/dL (ref 12.0–15.0)
Potassium: 4.1 mmol/L (ref 3.5–5.1)
Sodium: 139 mmol/L (ref 135–145)
TCO2: 28 mmol/L (ref 22–32)

## 2017-09-21 LAB — I-STAT TROPONIN, ED: Troponin i, poc: 0 ng/mL (ref 0.00–0.08)

## 2017-09-21 LAB — PROTIME-INR
INR: 0.96
Prothrombin Time: 12.7 seconds (ref 11.4–15.2)

## 2017-09-21 LAB — ETHANOL: Alcohol, Ethyl (B): <10 mg/dL (ref <10)

## 2017-09-21 MED ORDER — MAGNESIUM OXIDE 400 (241.3 MG) MG PO TABS
200.0000 mg | ORAL_TABLET | Freq: Every day | ORAL | Status: DC
  Administered 2017-09-24 – 2017-09-29 (×6): 200 mg via ORAL
  Filled 2017-09-21 (×6): qty 1

## 2017-09-21 MED ORDER — CLOPIDOGREL BISULFATE 75 MG PO TABS: 75.0000 mg | ORAL_TABLET | Freq: Every day | ORAL | Status: DC

## 2017-09-21 MED ORDER — SODIUM CHLORIDE 0.9 % IV SOLN
INTRAVENOUS | Status: AC
  Administered 2017-09-21: 21:00:00 via INTRAVENOUS

## 2017-09-21 MED ORDER — ACETAMINOPHEN 650 MG RE SUPP: 650.0000 mg | RECTAL | Status: DC | PRN

## 2017-09-21 MED ORDER — FAMOTIDINE IN NACL 20-0.9 MG/50ML-% IV SOLN
20.0000 mg | Freq: Every day | INTRAVENOUS | Status: DC
  Administered 2017-09-21 – 2017-09-25 (×5): 20 mg via INTRAVENOUS
  Filled 2017-09-21 (×4): qty 50

## 2017-09-21 MED ORDER — SODIUM CHLORIDE 0.9 % IV BOLUS
500.0000 mL | Freq: Once | INTRAVENOUS | Status: AC
  Administered 2017-09-21: 500 mL via INTRAVENOUS

## 2017-09-21 MED ORDER — FENTANYL CITRATE (PF) 100 MCG/2ML IJ SOLN
25.0000 ug | INTRAMUSCULAR | Status: DC | PRN
  Administered 2017-09-21 – 2017-09-23 (×4): 50 ug via INTRAVENOUS
  Filled 2017-09-21 (×5): qty 2

## 2017-09-21 MED ORDER — ENOXAPARIN SODIUM 40 MG/0.4ML ~~LOC~~ SOLN
40.0000 mg | SUBCUTANEOUS | Status: DC
  Administered 2017-09-21 – 2017-09-28 (×8): 40 mg via SUBCUTANEOUS
  Filled 2017-09-21 (×9): qty 0.4

## 2017-09-21 MED ORDER — FENTANYL CITRATE (PF) 100 MCG/2ML IJ SOLN
50.0000 ug | Freq: Once | INTRAMUSCULAR | Status: DC
  Filled 2017-09-21: qty 2

## 2017-09-21 MED ORDER — ONDANSETRON HCL 4 MG/2ML IJ SOLN
4.0000 mg | Freq: Four times a day (QID) | INTRAMUSCULAR | Status: DC | PRN
  Administered 2017-09-21: 4 mg via INTRAVENOUS
  Filled 2017-09-21: qty 2

## 2017-09-21 MED ORDER — FENTANYL CITRATE (PF) 100 MCG/2ML IJ SOLN
50.0000 ug | Freq: Once | INTRAMUSCULAR | Status: AC
  Administered 2017-09-21: 50 ug via INTRAVENOUS

## 2017-09-21 MED ORDER — ACETAMINOPHEN 325 MG PO TABS: 650.0000 mg | ORAL_TABLET | ORAL | Status: DC | PRN

## 2017-09-21 MED ORDER — ONDANSETRON HCL 4 MG/2ML IJ SOLN
4.0000 mg | Freq: Once | INTRAMUSCULAR | Status: AC
  Administered 2017-09-21: 4 mg via INTRAVENOUS
  Filled 2017-09-21: qty 2

## 2017-09-21 MED ORDER — POLYVINYL ALCOHOL 1.4 % OP SOLN
1.0000 [drp] | Freq: Two times a day (BID) | OPHTHALMIC | Status: DC | PRN
  Filled 2017-09-21: qty 15

## 2017-09-21 MED ORDER — IOPAMIDOL (ISOVUE-370) INJECTION 76%
INTRAVENOUS | Status: AC
  Administered 2017-09-21: 90 mL
  Filled 2017-09-21: qty 100

## 2017-09-21 MED ORDER — PREDNISOLONE ACETATE 1 % OP SUSP
1.0000 [drp] | Freq: Every day | OPHTHALMIC | Status: DC
  Administered 2017-09-21 – 2017-09-29 (×9): 1 [drp] via OPHTHALMIC
  Filled 2017-09-21: qty 5

## 2017-09-21 MED ORDER — ONDANSETRON HCL 4 MG/2ML IJ SOLN
4.0000 mg | Freq: Once | INTRAMUSCULAR | Status: DC
  Filled 2017-09-21: qty 2

## 2017-09-21 MED ORDER — ATORVASTATIN CALCIUM 80 MG PO TABS: 80.0000 mg | ORAL_TABLET | Freq: Every day | ORAL | Status: DC

## 2017-09-21 MED ORDER — ARTIFICIAL TEARS OPHTHALMIC OINT
1.0000 "application " | TOPICAL_OINTMENT | Freq: Every day | OPHTHALMIC | Status: DC
  Administered 2017-09-21 – 2017-09-29 (×8): 1 via OPHTHALMIC
  Filled 2017-09-21: qty 3.5

## 2017-09-21 MED ORDER — STROKE: EARLY STAGES OF RECOVERY BOOK
Freq: Once | Status: AC
  Administered 2017-09-21: 21:00:00
  Filled 2017-09-21: qty 1

## 2017-09-21 MED ORDER — PROSIGHT PO TABS
1.0000 | ORAL_TABLET | Freq: Every day | ORAL | Status: DC
  Administered 2017-09-26 – 2017-09-30 (×4): 1 via ORAL
  Filled 2017-09-21 (×5): qty 1

## 2017-09-21 MED ORDER — ACETAMINOPHEN 160 MG/5ML PO SOLN: 650.0000 mg | ORAL | Status: DC | PRN

## 2017-09-21 NOTE — ED Notes (Signed)
Admitting hospitalist at the bedside

## 2017-09-21 NOTE — ED Notes (Signed)
Pt transported to CT perfusion can with this RN.

## 2017-09-21 NOTE — ED Notes (Signed)
Pt back from ct at this time and placed in gown. Pt son at bedside.

## 2017-09-21 NOTE — ED Triage Notes (Signed)
Son stated, she was completely normal last night. She had breakfast but had left the milk out. No weakness Talks but doesn't make sense.  Keeps holding her head. Blind on left eye

## 2017-09-21 NOTE — ED Notes (Signed)
Pt noted to be vomiting and nauseous. New orders for fentanyl and zofran ordered by EDP.

## 2017-09-21 NOTE — ED Notes (Signed)
Pt back in room from CT scan.

## 2017-09-21 NOTE — ED Notes (Signed)
Dr. Cheral Marker with neurology paged to [place] per her request

## 2017-09-21 NOTE — ED Notes (Signed)
Son reports that he spoke with the patient at 9pm last night. Critical care at the bedside.

## 2017-09-21 NOTE — H&P (Signed)
History and Physical    [name] [name] LOV:[id number] DOB: 03/29/1924 DOA: 09/21/2017  PCP: Darcus Austin, MD   Patient coming from: Home  Chief Complaint: Confusion, nonsensical speech, vision disturbance, headache   HPI: [name] [name] is a 84 y.o. female with medical history significant for coronary artery disease, hypertension, left eye blindness, and history of CVA with residual right-sided visual deficits now presenting to the emergency department for evaluation of confusion, nonsensical speech, vision disturbance, and headache.  Patient was in her usual state of health yesterday, shopping with her daughter and trying on close.  She seemed to remain in her usual state last night at approximately 9 PM when her daughter spoke with her by phone.  She was found this afternoon shortly before 2 PM to be confused, with nonsensical speech, bumping into things with apparent inability to see, and complaining of headache.  She reported feeling tired yesterday, but she commonly reports this and there was no other complaints recently.  She had a loop recorder after her last CVA, was noted to have paroxysmal SVT, but no atrial fibrillation.  ED Course: Upon arrival to the ED, patient is found to be afebrile, saturating well on room air, and with vitals otherwise stable.  EKG features a sinus rhythm with early R transition.  Chemistry panel and CBC are unremarkable, troponin is undetectable, UDS is negative, and urinalysis features ketones.  CT head is negative for acute intracranial abnormality, but notable for the interval development of a large area of encephalomalacia involving the right occipital region.  Neurology was consulted by the ED physician and recommended CTA head and neck with perfusion study which is concerning for an acute left M3 occlusion, likely embolic, with associated core infarct in the left temporal occipital region and 11 mm penumbra.  Neurologist recommended medical admission for further  evaluation and management of this.  Review of Systems:  All other systems reviewed and apart from HPI, are negative.  Past Medical History:  Diagnosis Date  . Arthritis    "thumbs, joints" (03/23/2017  . Basal cell carcinoma    "several; scattered over my face, hands, leg some cut off; some burned off"  . Blind left eye    a. Corneal transplant x3 with rejection  . CAD (coronary artery disease)    a. s/p NSTEMI 2015 treated conservatively; nuclear stress test low risk. b. Continued angina despite med rx 07/2015 - s/p Bio Freedom Stent to ramus intermedius with mod LAD stenosis neg by FFR. // Nuc study 5/19:  EF 67, no ischemia or scar; Low Risk   . Complication of anesthesia    "very sensitive to RX"  . Diverticulitis large intestine   . Diverticulosis   . Family history of adverse reaction to anesthesia    "we all get PONV" (1/1/201)  . Gastritis   . GERD (gastroesophageal reflux disease)   . Hayfever   . Helicobacter pylori (H. pylori) 05/22/02   RUT-Positive  . Hypertension   . Myocardial infarction ([place]) 2015  . Squamous carcinoma    "several; scattered over my face, hands, leg some cut off; some burned off"  . Stroke [facility]) 2015   denies residual on 08/07/2015  . Stroke [facility]) 03/18/2017   "has effected her vision, balance, some memory" (03/23/2017)  . SVT (supraventricular tachycardia) ([facility])    a. seen by Dr. Caryl Comes - has loop recorder in. Patient has declined amiodarone due to side effect profile  . Varicose veins     Past Surgical History:  Procedure Laterality Date  . BASAL CELL CARCINOMA EXCISION     "several; scattered over my face, hands, leg some cut off; some burned off"  . CARDIAC CATHETERIZATION N/A 08/07/2015   Procedure: Left Heart Cath and Coronary Angiography;  Surgeon: [name], MD;  Location: [place] CV [facility];  Service: Cardiovascular;  Laterality: N/A;  . CARDIAC CATHETERIZATION N/A 08/07/2015   Procedure: Coronary Stent Intervention;  Surgeon: [name], MD;  Location: [place] [facility];  Service: Cardiovascular;  Laterality: N/A;  . CARDIAC CATHETERIZATION N/A 08/07/2015   Procedure: Intravascular Pressure Wire/FFR Study;  Surgeon: [name], MD;  Location: [place] [facility];  Service: Cardiovascular;  Laterality: N/A;  . CATARACT EXTRACTION W/ INTRAOCULAR LENS  IMPLANT, BILATERAL Bilateral 2005  . CLOSED REDUCTION SHOULDER DISLOCATION Left 2016 X 2  . CORNEAL TRANSPLANT Bilateral right 2009, left 2009    3 in left eye (last 2 failed), 1 in right eye  . DILATION AND CURETTAGE OF UTERUS    . EYE SURGERY    . JOINT REPLACEMENT    . [place]   "had gangrene in it"  . LOOP RECORDER IMPLANT N/A 01/05/2014   Procedure: LOOP RECORDER IMPLANT;  Surgeon: Coralyn Mark, MD;  Location: [place] CATH LAB;  Service: Cardiovascular;  Laterality: N/A;  . SQUAMOUS CELL CARCINOMA EXCISION     "several; scattered over my face, hands, leg some cut off; some burned off"  . TONSILLECTOMY    . TOTAL ABDOMINAL HYSTERECTOMY  11/1983   "ovaries and all"  . TOTAL KNEE ARTHROPLASTY Left 06/27/2012   Procedure: LEFT TOTAL KNEE ARTHROPLASTY;  Surgeon: Gearlean Alf, MD;  Location: [facility];  Service: Orthopedics;  Laterality: Left;  . TOTAL KNEE ARTHROPLASTY Right 12/12/2012   Procedure: RIGHT TOTAL KNEE ARTHROPLASTY;  Surgeon: Gearlean Alf, MD;  Location: [facility];  Service: Orthopedics;  Laterality: Right;  . TUBAL LIGATION  1966     reports that she has never smoked. She has never used smokeless tobacco. She reports that she does not drink alcohol or use drugs.  Allergies  Allergen Reactions  . Contrast Media [Iodinated Diagnostic Agents] Anaphylaxis  . Fluorescein Anaphylaxis, Shortness Of Breath, Itching and Swelling  . Hydralazine Hcl Anaphylaxis, Swelling, Palpitations and Rash  . Sulfa Antibiotics Rash and Other (See Comments)    Other Reaction: red bumps  . Ultram [Tramadol] Hives  . Yellow Dye Anaphylaxis, Itching,  Swelling and Other (See Comments)    Fluorescein (in eye drops)  . Buprenex [Buprenorphine Hcl] Palpitations and Other (See Comments)  . Keflex [Cephalexin] Diarrhea  . Lipitor [Atorvastatin] Other (See Comments)    Muscle weakness  . Augmentin [Amoxicillin-Pot Clavulanate] Nausea And Vomiting  . Levofloxacin Other (See Comments)    Reaction not recalled  . Percocet [Oxycodone-Acetaminophen] Nausea And Vomiting  . Tape Other (See Comments)    Adhesive tape pulls off the skin  . Zetia [Ezetimibe] Other (See Comments)    Myalgias    Family History  Problem Relation Age of Onset  . Stroke Mother   . Heart attack Father   . Heart attack Brother   . Cancer Brother   . Heart attack Brother   . Heart attack Brother   . Heart attack Brother   . Parkinsonism Brother      Prior to Admission medications   Medication Sig Start Date End Date Taking? Authorizing Provider  clopidogrel (PLAVIX) 75 MG tablet TAKE 1 TABLET EVERY DAY 08/24/17  [name], MD  Magnesium 250 MG TABS Take 250 mg by mouth at bedtime.    [provider]  Melatonin 1 MG SUBL Place 1 mg under the tongue at bedtime.    [provider]  Multiple Vitamin (MULTI VITAMIN DAILY PO) Take 1 tablet by mouth every morning.    [provider]  Multiple Vitamins-Minerals (PRESERVISION AREDS 2) CAPS Take 1 capsule by mouth daily after breakfast.    [provider]  nebivolol (BYSTOLIC) 2.5 MG tablet Take 1 tablet (2.5 mg total) by mouth 2 (two) times daily. 08/10/17   Richardson Dopp T, PA-C  nitroGLYCERIN (NITROSTAT) 0.4 MG SL tablet Place 1 tablet (0.4 mg total) under the tongue every 5 (five) minutes as needed for chest pain. 08/08/15   Lyda Jester M, PA-C  Polyethyl Glycol-Propyl Glycol (SYSTANE ULTRA) 0.4-0.3 % SOLN Apply 1 drop to eye 2 (two) times daily as needed (for irritation).     [provider]  Polyethyl Glycol-Propyl Glycol (SYSTANE) 0.4-0.3 % GEL ophthalmic gel  Place 1 application into both eyes at bedtime.    [provider]  prednisoLONE acetate (PRED FORTE) 1 % ophthalmic suspension Place 1 drop into both eyes See admin instructions. 1 drop into both eyes at bedtime spaced 5 minutes apart from Systane gel    [provider]  ranitidine (ZANTAC) 150 MG tablet Take 1 tablet (150 mg total) by mouth at bedtime. 11/06/16   Yetta Flock, MD    Physical Exam: Vitals:   09/21/17 1600 09/21/17 1611 09/21/17 1615  BP: (!) 171/69 (!) 175/69 (!) 179/71  Pulse: 68 68 70  Resp: (!) 28 12 10   Temp:  98.2 F (36.8 C)   TempSrc:  Rectal   SpO2: 100% 100% 98%      Constitutional: No acute distress, in apparent discomfort  Eyes: PERTLA, lids and conjunctivae normal ENMT: Mucous membranes are moist. Posterior pharynx clear of any exudate or lesions.   Neck: normal, supple, no masses, no thyromegaly Respiratory: clear to auscultation bilaterally, no wheezing, no crackles. Normal respiratory effort.   Cardiovascular: S1 & S2 heard, regular rate and rhythm. No significant JVD. Abdomen: No distension, no tenderness, soft. Bowel sounds normal.  Musculoskeletal: no clubbing / cyanosis. No joint deformity upper and lower extremities.   Skin: no significant rashes, lesions, ulcers. Warm, dry, well-perfused. Neurologic: Gross vision deficits, no facial asymmetry. Receptive aphasia. Sensation intact, patellar DTRs intact. Moving all extremities.    Labs on Admission: I have personally reviewed following labs and imaging studies  CBC: Recent Labs  Lab 09/21/17 1425 09/21/17 1440  WBC 5.3  --   NEUTROABS 4.2  --   HGB 14.5 15.0  HCT 46.0 44.0  MCV 96.2  --   PLT 197  --    Basic Metabolic Panel: Recent Labs  Lab 09/21/17 1425 09/21/17 1440  NA 142 139  K 4.2 4.1  CL 105 102  CO2 26  --   GLUCOSE 125* 121*  BUN 11 13  CREATININE 0.65 0.60  CALCIUM 9.0  --    GFR: CrCl cannot be calculated (Unknown ideal  weight.). Liver Function Tests: Recent Labs  Lab 09/21/17 1425  AST 31  ALT 25  ALKPHOS 94  BILITOT 0.7  PROT 6.5  ALBUMIN 3.9   No results for input(s): LIPASE, AMYLASE in the last 168 hours. No results for input(s): AMMONIA in the last 168 hours. Coagulation Profile: Recent Labs  Lab 09/21/17 1425  INR 0.96  Cardiac Enzymes: No results for input(s): CKTOTAL, CKMB, CKMBINDEX, TROPONINI in the last 168 hours. BNP (last 3 results) No results for input(s): PROBNP in the last 8760 hours. HbA1C: No results for input(s): HGBA1C in the last 72 hours. CBG: Recent Labs  Lab 09/21/17 1435  GLUCAP 116*   Lipid Profile: No results for input(s): CHOL, HDL, LDLCALC, TRIG, CHOLHDL, LDLDIRECT in the last 72 hours. Thyroid Function Tests: No results for input(s): TSH, T4TOTAL, FREET4, T3FREE, THYROIDAB in the last 72 hours. Anemia Panel: No results for input(s): VITAMINB12, FOLATE, FERRITIN, TIBC, IRON, RETICCTPCT in the last 72 hours. Urine analysis:    Component Value Date/Time   COLORURINE YELLOW 09/21/2017 [place] 09/21/2017 1533   LABSPEC 1.013 09/21/2017 1533   PHURINE 7.0 09/21/2017 1533   GLUCOSEU NEGATIVE 09/21/2017 1533   HGBUR NEGATIVE 09/21/2017 1533   BILIRUBINUR NEGATIVE 09/21/2017 1533   KETONESUR 20 (A) 09/21/2017 1533   PROTEINUR NEGATIVE 09/21/2017 1533   UROBILINOGEN 0.2 12/19/2014 1830   NITRITE NEGATIVE 09/21/2017 1533   LEUKOCYTESUR NEGATIVE 09/21/2017 1533   Sepsis Labs: @LABRCNTIP (procalcitonin:4,lacticidven:4) )No results found for this or any previous visit (from the past 240 hour(s)).   Radiological Exams on Admission: Ct Angio Head W Or Wo Contrast  Result Date: 09/21/2017 CLINICAL DATA:  Initial evaluation for acute aphasia, stroke. EXAM: CT ANGIOGRAPHY HEAD AND NECK CT PERFUSION BRAIN TECHNIQUE: Multidetector CT imaging of the head and neck was performed using the standard protocol during bolus administration of intravenous  contrast. Multiplanar CT image reconstructions and MIPs were obtained to evaluate the vascular anatomy. Carotid stenosis measurements (when applicable) are obtained utilizing NASCET criteria, using the distal internal carotid diameter as the denominator. Multiphase CT imaging of the brain was performed following IV bolus contrast injection. Subsequent parametric perfusion maps were calculated using RAPID software. CONTRAST:  68mL ISOVUE-370 IOPAMIDOL (ISOVUE-370) INJECTION 76% COMPARISON:  Prior CT from earlier the same day. FINDINGS: CTA NECK FINDINGS Aortic arch: Visualized aortic arch ectatic with normal 3 vessel morphology. Mild atheromatous plaque within the aortic arch. No hemodynamically significant stenosis about the origin of the great vessels. Visualized subclavian arteries tortuous proximally but widely patent without stenosis. Right carotid system: Right common carotid artery tortuous proximally but widely patent to the bifurcation. A centric plaque about the bifurcation without hemodynamically significant stenosis. Right ICA mildly tortuous but widely patent to the skull base without stenosis, dissection, or occlusion. Left carotid system: Left common carotid artery tortuous proximally but widely patent to the bifurcation without stenosis. No significant atheromatous narrowing about the left bifurcation. Left ICA tortuous and partially medialized into the retropharyngeal space. Left ICA patent to the skull base without stenosis, dissection, or occlusion. Vertebral arteries: Both of the vertebral arteries arise from the subclavian arteries. Left vertebral artery dominant. Vertebral arteries tortuous proximally but widely patent to the skull base without stenosis, dissection, or occlusion. Skeleton: No acute osseous abnormality. No worrisome lytic or blastic osseous lesions. Other neck: No acute soft tissue abnormality within the neck. Salivary glands normal. Thyroid normal. No adenopathy. Upper chest:  Shotty subcentimeter lymph nodes noted within the visualized upper mediastinum. Scattered atelectatic changes present within the partially visualized lungs. Review of the MIP images confirms the above findings CTA HEAD FINDINGS Anterior circulation: Petrous segments widely patent bilaterally. Mild scattered atheromatous plaque within the cavernous/supraclinoid ICAs without stenosis. ICA termini widely patent. A1 segments patent bilaterally. Left A1 hypoplastic. Normal anterior communicating artery. Anterior cerebral arteries patent to their distal aspects without flow-limiting stenosis.  Left M1 patent without stenosis. Normal left MCA bifurcation. There is a proximal left M3 occlusion rising off the inferior division (series 8, image 96). Adjacent inferior M3 branch remains patent. Superior left MCA division remains patent although distally appears attenuated in somewhat irregular. Right M1 mildly irregular and diffusely narrowed without high-grade or flow-limiting stenosis. Normal right MCA bifurcation. No proximal right M2 occlusion. Distal small vessel atheromatous irregularity present throughout the right MCA branches. Posterior circulation: V4 segments patent to the vertebrobasilar junction without flow-limiting stenosis. Left vertebral artery dominant. Posterior inferior cerebral arteries patent bilaterally. Basilar widely patent to its distal aspect without stenosis. Superior cerebral arteries patent bilaterally. Both of the posterior cerebral arteries supplied via the basilar. Scattered atheromatous irregularity throughout the mid and distal PCAs without high-grade correctable stenosis. Venous sinuses: Not well assessed due to arterial timing of the contrast bolus. Anatomic variants: None significant.  No aneurysm. Delayed phase: Not performed. Review of the MIP images confirms the above findings CT Brain Perfusion Findings: CBF (<30%) Volume: 23mL Perfusion (Tmax>6.0s) volume: 74mL Mismatch Volume: 71mL  Infarction Location:Ischemic core infarct involves the left temporoccipital region. Fairly small amount of surrounding ischemic penumbra. IMPRESSION: 1. Acute left M3 occlusion, likely embolic. Associated core infarct involving the left temporoccipital region with relatively small volume 11 mL surrounding ischemic penumbra. 2. Atheromatous change involving the intracranial circulation, most notable at the right M1 segment and bilateral PCAs. No hemodynamically significant or correctable stenosis identified. 3. Mild for age atherosclerotic change about the carotid bifurcations without high-grade stenosis. 4. Diffuse tortuosity of the major arterial vasculature of the neck, suggesting chronic underlying hypertension. Critical Value/emergent results were called by telephone at the time of interpretation on 09/21/2017 at 5:59 pm to Dr. Cheral Marker, who verbally acknowledged these results. Electronically Signed   By: Jeannine Boga M.D.   On: 09/21/2017 18:30   Ct Head Wo Contrast  Result Date: 09/21/2017 CLINICAL DATA:  Altered mental status. EXAM: CT HEAD WITHOUT CONTRAST TECHNIQUE: Contiguous axial images were obtained from the base of the skull through the vertex without intravenous contrast. COMPARISON:  CT head and MRI brain 03/18/2017. FINDINGS: Brain: There is a new large area of encephalomalacia within the right occipital lobe consistent with an interval stroke (axial images 13 through 17/2). No evidence of acute intracranial hemorrhage, mass lesion, brain edema or extra-axial fluid collection. Patchy low-density in the periventricular white matter appears stable. The ventricles and subarachnoid spaces are appropriately sized for age. There is no CT evidence of acute cortical infarction. Vascular: Intracranial vascular calcifications. No hyperdense vessel identified. Skull: Negative for fracture or focal lesion. Sinuses/Orbits: The visualized paranasal sinuses and mastoid air cells are clear. Hypoplastic  frontal sinuses. No orbital abnormalities are seen. Other: None. IMPRESSION: 1. No acute intracranial findings. 2. Interval development of a large area of encephalomalacia in the right occipital lobe consistent with a stroke sustained over the last 6 months. This has no definite acute components but could be further evaluated with MRI if clinically warranted. Electronically Signed   By: Richardean Sale M.D.   On: 09/21/2017 14:58   Ct Angio Neck W Or Wo Contrast  Result Date: 09/21/2017 CLINICAL DATA:  Initial evaluation for acute aphasia, stroke. EXAM: CT ANGIOGRAPHY HEAD AND NECK CT PERFUSION BRAIN TECHNIQUE: Multidetector CT imaging of the head and neck was performed using the standard protocol during bolus administration of intravenous contrast. Multiplanar CT image reconstructions and MIPs were obtained to evaluate the vascular anatomy. Carotid stenosis measurements (when applicable) are obtained utilizing  NASCET criteria, using the distal internal carotid diameter as the denominator. Multiphase CT imaging of the brain was performed following IV bolus contrast injection. Subsequent parametric perfusion maps were calculated using RAPID software. CONTRAST:  96mL ISOVUE-370 IOPAMIDOL (ISOVUE-370) INJECTION 76% COMPARISON:  Prior CT from earlier the same day. FINDINGS: CTA NECK FINDINGS Aortic arch: Visualized aortic arch ectatic with normal 3 vessel morphology. Mild atheromatous plaque within the aortic arch. No hemodynamically significant stenosis about the origin of the great vessels. Visualized subclavian arteries tortuous proximally but widely patent without stenosis. Right carotid system: Right common carotid artery tortuous proximally but widely patent to the bifurcation. A centric plaque about the bifurcation without hemodynamically significant stenosis. Right ICA mildly tortuous but widely patent to the skull base without stenosis, dissection, or occlusion. Left carotid system: Left common carotid  artery tortuous proximally but widely patent to the bifurcation without stenosis. No significant atheromatous narrowing about the left bifurcation. Left ICA tortuous and partially medialized into the retropharyngeal space. Left ICA patent to the skull base without stenosis, dissection, or occlusion. Vertebral arteries: Both of the vertebral arteries arise from the subclavian arteries. Left vertebral artery dominant. Vertebral arteries tortuous proximally but widely patent to the skull base without stenosis, dissection, or occlusion. Skeleton: No acute osseous abnormality. No worrisome lytic or blastic osseous lesions. Other neck: No acute soft tissue abnormality within the neck. Salivary glands normal. Thyroid normal. No adenopathy. Upper chest: Shotty subcentimeter lymph nodes noted within the visualized upper mediastinum. Scattered atelectatic changes present within the partially visualized lungs. Review of the MIP images confirms the above findings CTA HEAD FINDINGS Anterior circulation: Petrous segments widely patent bilaterally. Mild scattered atheromatous plaque within the cavernous/supraclinoid ICAs without stenosis. ICA termini widely patent. A1 segments patent bilaterally. Left A1 hypoplastic. Normal anterior communicating artery. Anterior cerebral arteries patent to their distal aspects without flow-limiting stenosis. Left M1 patent without stenosis. Normal left MCA bifurcation. There is a proximal left M3 occlusion rising off the inferior division (series 8, image 96). Adjacent inferior M3 branch remains patent. Superior left MCA division remains patent although distally appears attenuated in somewhat irregular. Right M1 mildly irregular and diffusely narrowed without high-grade or flow-limiting stenosis. Normal right MCA bifurcation. No proximal right M2 occlusion. Distal small vessel atheromatous irregularity present throughout the right MCA branches. Posterior circulation: V4 segments patent to the  vertebrobasilar junction without flow-limiting stenosis. Left vertebral artery dominant. Posterior inferior cerebral arteries patent bilaterally. Basilar widely patent to its distal aspect without stenosis. Superior cerebral arteries patent bilaterally. Both of the posterior cerebral arteries supplied via the basilar. Scattered atheromatous irregularity throughout the mid and distal PCAs without high-grade correctable stenosis. Venous sinuses: Not well assessed due to arterial timing of the contrast bolus. Anatomic variants: None significant.  No aneurysm. Delayed phase: Not performed. Review of the MIP images confirms the above findings CT Brain Perfusion Findings: CBF (<30%) Volume: 26mL Perfusion (Tmax>6.0s) volume: 64mL Mismatch Volume: 61mL Infarction Location:Ischemic core infarct involves the left temporoccipital region. Fairly small amount of surrounding ischemic penumbra. IMPRESSION: 1. Acute left M3 occlusion, likely embolic. Associated core infarct involving the left temporoccipital region with relatively small volume 11 mL surrounding ischemic penumbra. 2. Atheromatous change involving the intracranial circulation, most notable at the right M1 segment and bilateral PCAs. No hemodynamically significant or correctable stenosis identified. 3. Mild for age atherosclerotic change about the carotid bifurcations without high-grade stenosis. 4. Diffuse tortuosity of the major arterial vasculature of the neck, suggesting chronic underlying hypertension. Critical Value/emergent results were  called by telephone at the time of interpretation on 09/21/2017 at 5:59 pm to Dr. Cheral Marker, who verbally acknowledged these results. Electronically Signed   By: Jeannine Boga M.D.   On: 09/21/2017 18:30   Ct Cerebral Perfusion W Contrast  Result Date: 09/21/2017 CLINICAL DATA:  Initial evaluation for acute aphasia, stroke. EXAM: CT ANGIOGRAPHY HEAD AND NECK CT PERFUSION BRAIN TECHNIQUE: Multidetector CT imaging of the  head and neck was performed using the standard protocol during bolus administration of intravenous contrast. Multiplanar CT image reconstructions and MIPs were obtained to evaluate the vascular anatomy. Carotid stenosis measurements (when applicable) are obtained utilizing NASCET criteria, using the distal internal carotid diameter as the denominator. Multiphase CT imaging of the brain was performed following IV bolus contrast injection. Subsequent parametric perfusion maps were calculated using RAPID software. CONTRAST:  26mL ISOVUE-370 IOPAMIDOL (ISOVUE-370) INJECTION 76% COMPARISON:  Prior CT from earlier the same day. FINDINGS: CTA NECK FINDINGS Aortic arch: Visualized aortic arch ectatic with normal 3 vessel morphology. Mild atheromatous plaque within the aortic arch. No hemodynamically significant stenosis about the origin of the great vessels. Visualized subclavian arteries tortuous proximally but widely patent without stenosis. Right carotid system: Right common carotid artery tortuous proximally but widely patent to the bifurcation. A centric plaque about the bifurcation without hemodynamically significant stenosis. Right ICA mildly tortuous but widely patent to the skull base without stenosis, dissection, or occlusion. Left carotid system: Left common carotid artery tortuous proximally but widely patent to the bifurcation without stenosis. No significant atheromatous narrowing about the left bifurcation. Left ICA tortuous and partially medialized into the retropharyngeal space. Left ICA patent to the skull base without stenosis, dissection, or occlusion. Vertebral arteries: Both of the vertebral arteries arise from the subclavian arteries. Left vertebral artery dominant. Vertebral arteries tortuous proximally but widely patent to the skull base without stenosis, dissection, or occlusion. Skeleton: No acute osseous abnormality. No worrisome lytic or blastic osseous lesions. Other neck: No acute soft tissue  abnormality within the neck. Salivary glands normal. Thyroid normal. No adenopathy. Upper chest: Shotty subcentimeter lymph nodes noted within the visualized upper mediastinum. Scattered atelectatic changes present within the partially visualized lungs. Review of the MIP images confirms the above findings CTA HEAD FINDINGS Anterior circulation: Petrous segments widely patent bilaterally. Mild scattered atheromatous plaque within the cavernous/supraclinoid ICAs without stenosis. ICA termini widely patent. A1 segments patent bilaterally. Left A1 hypoplastic. Normal anterior communicating artery. Anterior cerebral arteries patent to their distal aspects without flow-limiting stenosis. Left M1 patent without stenosis. Normal left MCA bifurcation. There is a proximal left M3 occlusion rising off the inferior division (series 8, image 96). Adjacent inferior M3 branch remains patent. Superior left MCA division remains patent although distally appears attenuated in somewhat irregular. Right M1 mildly irregular and diffusely narrowed without high-grade or flow-limiting stenosis. Normal right MCA bifurcation. No proximal right M2 occlusion. Distal small vessel atheromatous irregularity present throughout the right MCA branches. Posterior circulation: V4 segments patent to the vertebrobasilar junction without flow-limiting stenosis. Left vertebral artery dominant. Posterior inferior cerebral arteries patent bilaterally. Basilar widely patent to its distal aspect without stenosis. Superior cerebral arteries patent bilaterally. Both of the posterior cerebral arteries supplied via the basilar. Scattered atheromatous irregularity throughout the mid and distal PCAs without high-grade correctable stenosis. Venous sinuses: Not well assessed due to arterial timing of the contrast bolus. Anatomic variants: None significant.  No aneurysm. Delayed phase: Not performed. Review of the MIP images confirms the above findings CT Brain  Perfusion Findings: CBF (<  30%) Volume: 75mL Perfusion (Tmax>6.0s) volume: 52mL Mismatch Volume: 21mL Infarction Location:Ischemic core infarct involves the left temporoccipital region. Fairly small amount of surrounding ischemic penumbra. IMPRESSION: 1. Acute left M3 occlusion, likely embolic. Associated core infarct involving the left temporoccipital region with relatively small volume 11 mL surrounding ischemic penumbra. 2. Atheromatous change involving the intracranial circulation, most notable at the right M1 segment and bilateral PCAs. No hemodynamically significant or correctable stenosis identified. 3. Mild for age atherosclerotic change about the carotid bifurcations without high-grade stenosis. 4. Diffuse tortuosity of the major arterial vasculature of the neck, suggesting chronic underlying hypertension. Critical Value/emergent results were called by telephone at the time of interpretation on 09/21/2017 at 5:59 pm to Dr. Cheral Marker, who verbally acknowledged these results. Electronically Signed   By: Jeannine Boga M.D.   On: 09/21/2017 18:30    EKG: Independently reviewed. Sinus rhythm, early R transition.   Assessment/Plan  1. Acute CVA with aphasia  - Presents with aphasia, confusion, and headache, last confirmed normal at ~21:00 on 7/1  - CTA head and neck reveals acute M3 occlusion with associated infarct in posterior left temporal lobe with 1 mm penumbra  - Appreciate neurology eval and recs  - Check MRI brain, echocardiogram, fasting lipids, A1c  - Continue cardiac monitoring with frequent neuro checks and PT/OT/SLP evals  - Continue Plavix, start Lipitor 80 mg qHS    2. CAD  - No anginal complaints, troponin undetectable  - Continue Plavix, hold beta-blocker initial in light of acute ischemic CVA    3. Hypertension  - BP slightly elevated in ED  - Hold beta-blocker in acute phase of ischemic CVA     DVT prophylaxis: Lovenox Code Status: Full  Family Communication: Son and  daughters updated at bedside Consults called: Neurology Admission status: Inpatient    Vianne Bulls, MD [facility] Pager [phone]  If 7PM-7AM, please contact night-coverage [email or web address] Password [facility]  09/21/2017, 7:33 PM

## 2017-09-21 NOTE — ED Notes (Signed)
Patient linen changed and bathed due to dislodged purewick.

## 2017-09-21 NOTE — Progress Notes (Signed)
CTA shows left M3 occlusion, most likely not accessible by catheter per discussion with Radiologist.   CTP shows left posterior temporal lobe stroke with 11 cc penumbra, not sufficient to justify risk of clot retraction procedure.   Plan:  --Admit to hospitalist service for stroke work up to include the following:   --HgbA1c, fasting lipid panel --MRI of the brain without contrast --PT consult, OT consult, Speech consult --Echocardiogram --80 mg of Atorvistatin --Prophylactic therapy-Antiplatelet med: -- continue Plavix for now. Had a 10 second run of a-fib in the past that was not felt to militate strongly in favor of anticoagulation. May need to reconsider anticoagulation given stroke while on Plavix.  --Telemetry monitoring --Frequent neuro checks --NPO until passes stroke swallow screen --Stroke team to see in AM.   Electronically signed: Dr. Kerney Elbe

## 2017-09-21 NOTE — Consult Note (Addendum)
Requesting Physician: Dr. Vanita Panda    Chief Complaint: Possible code stroke  History obtained from: Family  HPI:                                                                                                                                         [name] [name] is an 84 y.o. female with history of hypertension, SVT, CHF, CAD, left eye legally blind and strokes on Plavix with residue left hemianopia who was last seen normal at 9 PM last night.  She lives by herself.  She is blind in her left eye.  And previous stroke left her with a left hemianopsia.  From what the family says they noted that this morning she had gotten up and it looks like she made breakfast however no one had spoken to her.  When family came to see her today they noted that she is very confused and did not understand what they were saying. Her words also did not make sense. Family also noted that the scene at home seemed that pt had difficulty with vision as there were evidence that she could not see things and knocked down picture frames and other stuff on the floor. They brought her to the hospital.  CT head no acute abnormality but chronic right PCA infarct.  On exam, pt was aphasic not able to understand commands or express herself.  In addition I could not get her to blink to threat on the right or left.  Due to this and the fact that she was still within the window for perfusion scan it was explained to the family that she would need a CTA head and neck and perfusion to see if any of the tissue is savable.  Pt was admitted 02/2018 for left facial droop and left lip numbness. CTA head and neck right PCA occlusion and left P2 stenosis.  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  LDL 118, A1c 5.3.  EF 55-60%.  Given stroke location and intracranial vascular stenosis, recommend DAPT for 3 weeks and then Plavix alone.  Continue Lipitor 40. PT/OT recommend  CIR.  She had stroke in 12/2013 with right arm weakness. MRI showed left high frontal cortex infarct.  MRA showed right PCA moderate stenosis.  Carotid Doppler, TTE, DVT negative.  LDL 91 elevated A1c 6.0.  Loop recorder placed.  In 01/2015, she was found to have A. fib lasting 10 seconds, however frequent intermittent SVTs.  She follows Dr. Burt Knack for SVT management, on bystolic. The most recent loop recorder interrogation showed 4AF episodes (<1% burden) - available EGMs show PACs.     Date last known well: Date: 09/20/2017 Time last known well: Time: 21:00 tPA Given: No: Out of the window Modified Rankin: Rankin Score=0   Past Medical History:  Diagnosis Date  . Arthritis    "thumbs, joints" (  03/23/2017  . Basal cell carcinoma    "several; scattered over my face, hands, leg some cut off; some burned off"  . Blind left eye    a. Corneal transplant x3 with rejection  . CAD (coronary artery disease)    a. s/p NSTEMI 2015 treated conservatively; nuclear stress test low risk. b. Continued angina despite med rx 07/2015 - s/p Bio Freedom Stent to ramus intermedius with mod LAD stenosis neg by FFR. // Nuc study 5/19:  EF 67, no ischemia or scar; Low Risk   . Complication of anesthesia    "very sensitive to RX"  . Diverticulitis large intestine   . Diverticulosis   . Family history of adverse reaction to anesthesia    "we all get PONV" (1/1/201)  . Gastritis   . GERD (gastroesophageal reflux disease)   . Hayfever   . Helicobacter pylori (H. pylori) 05/22/02   RUT-Positive  . Hypertension   . Myocardial infarction ([place]) 2015  . Squamous carcinoma    "several; scattered over my face, hands, leg some cut off; some burned off"  . Stroke [facility]) 2015   denies residual on 08/07/2015  . Stroke [facility]) 03/18/2017   "has effected her vision, balance, some memory" (03/23/2017)  . SVT (supraventricular tachycardia) ([facility])    a. seen by Dr. Caryl Comes - has loop recorder in. Patient has declined amiodarone  due to side effect profile  . Varicose veins     Past Surgical History:  Procedure Laterality Date  . BASAL CELL CARCINOMA EXCISION     "several; scattered over my face, hands, leg some cut off; some burned off"  . CARDIAC CATHETERIZATION N/A 08/07/2015   Procedure: Left Heart Cath and Coronary Angiography;  Surgeon: [name], MD;  Location: [place] CV [facility];  Service: Cardiovascular;  Laterality: N/A;  . CARDIAC CATHETERIZATION N/A 08/07/2015   Procedure: Coronary Stent Intervention;  Surgeon: [name], MD;  Location: [place] [facility];  Service: Cardiovascular;  Laterality: N/A;  . CARDIAC CATHETERIZATION N/A 08/07/2015   Procedure: Intravascular Pressure Wire/FFR Study;  Surgeon: [name], MD;  Location: [place] [facility];  Service: Cardiovascular;  Laterality: N/A;  . CATARACT EXTRACTION W/ INTRAOCULAR LENS  IMPLANT, BILATERAL Bilateral 2005  . CLOSED REDUCTION SHOULDER DISLOCATION Left 2016 X 2  . CORNEAL TRANSPLANT Bilateral right 2009, left 2009    3 in left eye (last 2 failed), 1 in right eye  . DILATION AND CURETTAGE OF UTERUS    . EYE SURGERY    . JOINT REPLACEMENT    . [place]   "had gangrene in it"  . LOOP RECORDER IMPLANT N/A 01/05/2014   Procedure: LOOP RECORDER IMPLANT;  Surgeon: Coralyn Mark, MD;  Location: [place] CATH LAB;  Service: Cardiovascular;  Laterality: N/A;  . SQUAMOUS CELL CARCINOMA EXCISION     "several; scattered over my face, hands, leg some cut off; some burned off"  . TONSILLECTOMY    . TOTAL ABDOMINAL HYSTERECTOMY  11/1983   "ovaries and all"  . TOTAL KNEE ARTHROPLASTY Left 06/27/2012   Procedure: LEFT TOTAL KNEE ARTHROPLASTY;  Surgeon: Gearlean Alf, MD;  Location: [facility];  Service: Orthopedics;  Laterality: Left;  . TOTAL KNEE ARTHROPLASTY Right 12/12/2012   Procedure: RIGHT TOTAL KNEE ARTHROPLASTY;  Surgeon: Gearlean Alf, MD;  Location: [facility];  Service: Orthopedics;  Laterality: Right;  . TUBAL  LIGATION  1966    Family History  Problem Relation Age of Onset  . Stroke Mother   .  Heart attack Father   . Heart attack Brother   . Cancer Brother   . Heart attack Brother   . Heart attack Brother   . Heart attack Brother   . Parkinsonism Brother      Social History:  reports that she has never smoked. She has never used smokeless tobacco. She reports that she does not drink alcohol or use drugs.  Allergies:  Allergies  Allergen Reactions  . Contrast Media [Iodinated Diagnostic Agents] Anaphylaxis  . Fluorescein Anaphylaxis, Shortness Of Breath, Itching and Swelling  . Hydralazine Hcl Anaphylaxis, Swelling, Palpitations and Rash  . Sulfa Antibiotics Rash and Other (See Comments)    Other Reaction: red bumps  . Ultram [Tramadol] Hives  . Yellow Dye Anaphylaxis, Itching, Swelling and Other (See Comments)    Fluorescein (in eye drops)  . Buprenex [Buprenorphine Hcl] Palpitations and Other (See Comments)  . Keflex [Cephalexin] Diarrhea  . Lipitor [Atorvastatin] Other (See Comments)    Muscle weakness  . Augmentin [Amoxicillin-Pot Clavulanate] Nausea And Vomiting  . Levofloxacin Other (See Comments)    Reaction not recalled  . Percocet [Oxycodone-Acetaminophen] Nausea And Vomiting  . Tape Other (See Comments)    Adhesive tape pulls off the skin  . Zetia [Ezetimibe] Other (See Comments)    Myalgias    Medications:                                                                                                                           Current Facility-Administered Medications  Medication Dose Route Frequency Provider Last Rate Last Dose  . fentaNYL (SUBLIMAZE) injection 50 mcg  50 mcg Intravenous Once Kirichenko, Lahoma Rocker, PA-C   Stopped at 09/21/17 1643  . ondansetron (ZOFRAN) injection 4 mg  4 mg Intravenous Once Kirichenko, Lahoma Rocker, PA-C   Stopped at 09/21/17 1645  . sodium chloride 0.9 % bolus 500 mL  500 mL Intravenous Once Jeannett Senior, PA-C 492 mL/hr at 09/21/17  1645 500 mL at 09/21/17 1645   Current Outpatient Medications  Medication Sig Dispense Refill  . clopidogrel (PLAVIX) 75 MG tablet TAKE 1 TABLET EVERY DAY 90 tablet 3  . Magnesium 250 MG TABS Take 250 mg by mouth at bedtime.    . Melatonin 1 MG SUBL Place 1 mg under the tongue at bedtime.    . Multiple Vitamin (MULTI VITAMIN DAILY PO) Take 1 tablet by mouth every morning.    . Multiple Vitamins-Minerals (PRESERVISION AREDS 2) CAPS Take 1 capsule by mouth daily after breakfast.    . nebivolol (BYSTOLIC) 2.5 MG tablet Take 1 tablet (2.5 mg total) by mouth 2 (two) times daily. 180 tablet 3  . nitroGLYCERIN (NITROSTAT) 0.4 MG SL tablet Place 1 tablet (0.4 mg total) under the tongue every 5 (five) minutes as needed for chest pain. 25 tablet 2  . Polyethyl Glycol-Propyl Glycol (SYSTANE ULTRA) 0.4-0.3 % SOLN Apply 1 drop to eye 2 (two) times daily as needed (for irritation).     Marland Kitchen  Polyethyl Glycol-Propyl Glycol (SYSTANE) 0.4-0.3 % GEL ophthalmic gel Place 1 application into both eyes at bedtime.    . prednisoLONE acetate (PRED FORTE) 1 % ophthalmic suspension Place 1 drop into both eyes See admin instructions. 1 drop into both eyes at bedtime spaced 5 minutes apart from Systane gel    . ranitidine (ZANTAC) 150 MG tablet Take 1 tablet (150 mg total) by mouth at bedtime. 90 tablet 3     ROS:                                                                                                                                       History obtained from unobtainable from patient due to mental status    General Examination:                                                                                                      Blood pressure (!) 179/71, pulse 70, temperature 98.2 F (36.8 C), temperature source Rectal, resp. rate 10, SpO2 98 %.  HEENT-  Normocephalic, no lesions, without obvious abnormality.  Normal external eye and conjunctiva.   Extremities- Warm, dry and intact Musculoskeletal-no joint  tenderness, deformity or swelling Skin-warm and dry, no hyperpigmentation, vitiligo, or suspicious lesions  Neurological Examination Mental Status: Patient is alert, complaining of a headache by pointing.  She is moaning and cannot follow any commands and cannot express herself. Cranial Nerves: II: Patient has a blind left eye, she already had a left hemianopsia now showing a right hemianopsia as she does not blink to threat III,IV, VI: Doll's appear intact on the right eye V,VII: Face is symmetrical VIII: She is very hard of hearing  Motor: Doing all extremities antigravity with 5/5 strength to noxious stimuli Sensory: As above Deep Tendon Reflexes: 1+ in the upper extremities no knee jerk and no ankle jerk Plantars: Right: downgoing   Left: downgoing Cerebellar: Not able to test Gait: Not tested   Lab Results: Basic Metabolic Panel: Recent Labs  Lab 09/21/17 1425 09/21/17 1440  NA 142 139  K 4.2 4.1  CL 105 102  CO2 26  --   GLUCOSE 125* 121*  BUN 11 13  CREATININE 0.65 0.60  CALCIUM 9.0  --     CBC: Recent Labs  Lab 09/21/17 1425 09/21/17 1440  WBC 5.3  --   NEUTROABS 4.2  --   HGB 14.5 15.0  HCT 46.0 44.0  MCV 96.2  --   PLT 197  --  Lipid Panel: No results for input(s): CHOL, TRIG, HDL, CHOLHDL, VLDL, LDLCALC in the last 168 hours.  CBG: Recent Labs  Lab 09/21/17 1435  GLUCAP 116*    Imaging: Ct Head Wo Contrast  Result Date: 09/21/2017 CLINICAL DATA:  Altered mental status. EXAM: CT HEAD WITHOUT CONTRAST TECHNIQUE: Contiguous axial images were obtained from the base of the skull through the vertex without intravenous contrast. COMPARISON:  CT head and MRI brain 03/18/2017. FINDINGS: Brain: There is a new large area of encephalomalacia within the right occipital lobe consistent with an interval stroke (axial images 13 through 17/2). No evidence of acute intracranial hemorrhage, mass lesion, brain edema or extra-axial fluid collection. Patchy  low-density in the periventricular white matter appears stable. The ventricles and subarachnoid spaces are appropriately sized for age. There is no CT evidence of acute cortical infarction. Vascular: Intracranial vascular calcifications. No hyperdense vessel identified. Skull: Negative for fracture or focal lesion. Sinuses/Orbits: The visualized paranasal sinuses and mastoid air cells are clear. Hypoplastic frontal sinuses. No orbital abnormalities are seen. Other: None. IMPRESSION: 1. No acute intracranial findings. 2. Interval development of a large area of encephalomalacia in the right occipital lobe consistent with a stroke sustained over the last 6 months. This has no definite acute components but could be further evaluated with MRI if clinically warranted. Electronically Signed   By: Richardean Sale M.D.   On: 09/21/2017 14:58    Assessment and plan discussed with with attending physician and they are in agreement.    Etta Quill PA-C [facility] [phone]  09/21/2017, 4:50 PM   Assessment: 84 y.o. female presenting with acute onset of cortical blindness and receptive aphasia, most likely secondary to left occipital lobe and posterior left temporal lobe stroke.  Stroke Risk Factors - As listed in PMHx  Recommend --STAT CTA head/neck with CTP  --Additional plan to be determined by results of the above scans  45 minutes spent in the emergent neurological evaluation and management of this critically ill acute stroke patient.  Electronically signed: Dr. Kerney Elbe

## 2017-09-21 NOTE — ED Provider Notes (Signed)
[facility] EMERGENCY DEPARTMENT Provider Note   CSN: [id number] Arrival date & time: 09/21/17  1406     History   Chief Complaint Chief Complaint  Patient presents with  . Aphasia  . Headache    HPI [name] [name] is a 84 y.o. female.  HPI [name] [name] is a 84 y.o. female with history of coronary artery disease, hypertension, CVA, SVT, presents to emergency department with complaint of altered mental status.  Patient lives alone, her daughter and son check on her daily.  She was last seen normal last night, daughter states she went shopping with her.  Patient at baseline is fully functional, lives alone, has normal speech,  able to take care of herself.  Today, patient's son came to her house and found her laying at home is soiled, which is unusual for her, unable to speak, holding her head complaining of a headache.  Patient speech is difficult to understand.  No known recent illnesses.  No new medication changes.  No other complaints.  Past Medical History:  Diagnosis Date  . Arthritis    "thumbs, joints" (03/23/2017  . Basal cell carcinoma    "several; scattered over my face, hands, leg some cut off; some burned off"  . Blind left eye    a. Corneal transplant x3 with rejection  . CAD (coronary artery disease)    a. s/p NSTEMI 2015 treated conservatively; nuclear stress test low risk. b. Continued angina despite med rx 07/2015 - s/p Bio Freedom Stent to ramus intermedius with mod LAD stenosis neg by FFR. // Nuc study 5/19:  EF 67, no ischemia or scar; Low Risk   . Complication of anesthesia    "very sensitive to RX"  . Diverticulitis large intestine   . Diverticulosis   . Family history of adverse reaction to anesthesia    "we all get PONV" (1/1/201)  . Gastritis   . GERD (gastroesophageal reflux disease)   . Hayfever   . Helicobacter pylori (H. pylori) 05/22/02   RUT-Positive  . Hypertension   . Myocardial infarction ([place]) 2015  . Squamous carcinoma    "several; scattered over my face, hands, leg some cut off; some burned off"  . Stroke [facility]) 2015   denies residual on 08/07/2015  . Stroke [facility]) 03/18/2017   "has effected her vision, balance, some memory" (03/23/2017)  . SVT (supraventricular tachycardia) (HCC)    a. seen by Dr. Caryl Comes - has loop recorder in. Patient has declined amiodarone due to side effect profile  . Varicose veins     Patient Active Problem List   Diagnosis Date Noted  . Legally blind in right eye, as defined in [place] 04/02/2017  . Visual field cut- right 04/02/2017  . Gastroesophageal reflux disease   . Brainstem infarct, acute ([place]) 03/24/2017  . Stroke-like symptoms   . History of supraventricular tachycardia   . Hypokalemia   . Hyponatremia   . Stroke ([place]) 03/18/2017  . Other forms of angina pectoris ([place]) 08/07/2015  . Pre-diabetes   . Blind left eye- (Fuch's disease) 03/05/2015  . Diverticulitis 12/19/2014  . UTI (lower urinary tract infection) 12/19/2014  . Essential hypertension 12/19/2014  . CAD (coronary artery disease) 12/19/2014  . Diverticulitis of large intestine without perforation or abscess without bleeding   . History of Rt brain stroke Oct 2015 06/14/2014  . Headache 06/14/2014  . Malignant hypertension 01/20/2014  . Hypertensive urgency, malignant 01/20/2014  . NSTEMI Nov 2015- conservative Rx 01/20/2014  . Chest  pain with moderate risk of acute coronary syndrome 01/19/2014  . Aphasia 01/04/2014  . Paresthesia 01/04/2014  . Acute CVA (cerebrovascular accident) ([place]) 01/04/2014  . LLQ abdominal pain 01/01/2014  . Generalized abdominal pain 01/01/2014  . History of diverticulitis 01/01/2014  . Diverticulitis of colon without hemorrhage 12/07/2013  . Diaphoresis 12/29/2012  . Anemia 12/28/2012  . Sepsis ([place]) 12/24/2012  . Cellulitis of leg, right 12/24/2012  . Pyuria 12/24/2012  . Hypoxemia 12/24/2012  . Acute on chronic diastolic heart failure ([place]) 12/24/2012  . Postoperative  anemia due to acute blood loss 12/15/2012  . Sleep disturbance 10/03/2012  . Abdominal pulsatile mass 08/24/2012  . Depression 07/12/2012  . OA (osteoarthritis) of knee 06/27/2012  . History of PSVT 12/15/2011    Past Surgical History:  Procedure Laterality Date  . BASAL CELL CARCINOMA EXCISION     "several; scattered over my face, hands, leg some cut off; some burned off"  . CARDIAC CATHETERIZATION N/A 08/07/2015   Procedure: Left Heart Cath and Coronary Angiography;  Surgeon: [name], MD;  Location: [facility] CV LAB;  Service: Cardiovascular;  Laterality: N/A;  . CARDIAC CATHETERIZATION N/A 08/07/2015   Procedure: Coronary Stent Intervention;  Surgeon: [name], MD;  Location: [facility] CV LAB;  Service: Cardiovascular;  Laterality: N/A;  . CARDIAC CATHETERIZATION N/A 08/07/2015   Procedure: Intravascular Pressure Wire/FFR Study;  Surgeon: [name], MD;  Location: [facility] CV LAB;  Service: Cardiovascular;  Laterality: N/A;  . CATARACT EXTRACTION W/ INTRAOCULAR LENS  IMPLANT, BILATERAL Bilateral 2005  . CLOSED REDUCTION SHOULDER DISLOCATION Left 2016 X 2  . CORNEAL TRANSPLANT Bilateral right 2009, left 2009    3 in left eye (last 2 failed), 1 in right eye  . DILATION AND CURETTAGE OF UTERUS    . EYE SURGERY    . JOINT REPLACEMENT    . [place]   "had gangrene in it"  . LOOP RECORDER IMPLANT N/A 01/05/2014   Procedure: LOOP RECORDER IMPLANT;  Surgeon: Coralyn Mark, MD;  Location: [facility] CATH LAB;  Service: Cardiovascular;  Laterality: N/A;  . SQUAMOUS CELL CARCINOMA EXCISION     "several; scattered over my face, hands, leg some cut off; some burned off"  . TONSILLECTOMY    . TOTAL ABDOMINAL HYSTERECTOMY  11/1983   "ovaries and all"  . TOTAL KNEE ARTHROPLASTY Left 06/27/2012   Procedure: LEFT TOTAL KNEE ARTHROPLASTY;  Surgeon: Gearlean Alf, MD;  Location: [facility];  Service: Orthopedics;  Laterality: Left;  . TOTAL KNEE ARTHROPLASTY  Right 12/12/2012   Procedure: RIGHT TOTAL KNEE ARTHROPLASTY;  Surgeon: Gearlean Alf, MD;  Location: [facility];  Service: Orthopedics;  Laterality: Right;  . TUBAL LIGATION  1966     OB History   None      Home Medications    Prior to Admission medications   Medication Sig Start Date End Date Taking? Authorizing Provider  clopidogrel (PLAVIX) 75 MG tablet TAKE 1 TABLET EVERY DAY 08/24/17   [name], MD  Magnesium 250 MG TABS Take 250 mg by mouth at bedtime.    [provider]  Melatonin 1 MG SUBL Place 1 mg under the tongue at bedtime.    [provider]  Multiple Vitamin (MULTI VITAMIN DAILY PO) Take 1 tablet by mouth every morning.    [provider]  Multiple Vitamins-Minerals (PRESERVISION AREDS 2) CAPS Take 1 capsule by mouth daily after breakfast.    [provider]  nebivolol (BYSTOLIC) 2.5  MG tablet Take 1 tablet (2.5 mg total) by mouth 2 (two) times daily. 08/10/17   Richardson Dopp T, PA-C  nitroGLYCERIN (NITROSTAT) 0.4 MG SL tablet Place 1 tablet (0.4 mg total) under the tongue every 5 (five) minutes as needed for chest pain. 08/08/15   Lyda Jester M, PA-C  Polyethyl Glycol-Propyl Glycol (SYSTANE ULTRA) 0.4-0.3 % SOLN Apply 1 drop to eye 2 (two) times daily as needed (for irritation).     [provider]  Polyethyl Glycol-Propyl Glycol (SYSTANE) 0.4-0.3 % GEL ophthalmic gel Place 1 application into both eyes at bedtime.    [provider]  prednisoLONE acetate (PRED FORTE) 1 % ophthalmic suspension Place 1 drop into both eyes See admin instructions. 1 drop into both eyes at bedtime spaced 5 minutes apart from Systane gel    [provider]  ranitidine (ZANTAC) 150 MG tablet Take 1 tablet (150 mg total) by mouth at bedtime. 11/06/16   Armbruster, Carlota Raspberry, MD    Family History Family History  Problem Relation Age of Onset  . Stroke Mother   . Heart attack Father   . Heart attack Brother   . Cancer Brother    . Heart attack Brother   . Heart attack Brother   . Heart attack Brother   . Parkinsonism Brother     Social History Social History   Tobacco Use  . Smoking status: Never Smoker  . Smokeless tobacco: Never Used  Substance Use Topics  . Alcohol use: No  . Drug use: No     Allergies   Contrast media [iodinated diagnostic agents]; Fluorescein; Hydralazine hcl; Sulfa antibiotics; Ultram [tramadol]; Yellow dye; Buprenex [buprenorphine hcl]; Keflex [cephalexin]; Lipitor [atorvastatin]; Augmentin [amoxicillin-pot clavulanate]; Levofloxacin; Percocet [oxycodone-acetaminophen]; Tape; and Zetia [ezetimibe]   Review of Systems Review of Systems  Unable to perform ROS: Mental status change     Physical Exam Updated Vital Signs There were no vitals taken for this visit.  Physical Exam  Constitutional: She appears well-developed and well-nourished.  Appears to be in pain, holding her head  HENT:  Head: Normocephalic and atraumatic.  Eyes: Conjunctivae and EOM are normal.  Left pupil nonreactive, chronic.  Right pupil is round, reactive to light and accommodation  Neck: Normal range of motion. Neck supple.  Cardiovascular: Normal rate, regular rhythm and normal heart sounds.  Pulmonary/Chest: Effort normal and breath sounds normal. No respiratory distress. She has no wheezes. She has no rales.  Abdominal: There is no tenderness.  Musculoskeletal: Normal range of motion. She exhibits no edema.  Neurological: She is alert.  Opens her eyes on command, responds but speech is incomprehensible.  Gaze seems to be deviated to the left.  Only follows certain commands.  Able to squeeze both hands.  Would not move her legs on the command, however ambulated to the car.  No pronator drift.  Skin: Skin is warm and dry.  Psychiatric: She has a normal mood and affect. Her behavior is normal.  Nursing note and vitals reviewed.    ED Treatments / Results  Labs (all labs ordered are listed, but  only abnormal results are displayed) Labs Reviewed  COMPREHENSIVE METABOLIC PANEL - Abnormal; Notable for the following components:      Result Value   Glucose, Bld 125 (*)    All other components within normal limits  I-STAT CHEM 8, ED - Abnormal; Notable for the following components:   Glucose, Bld 121 (*)    Calcium, Ion 1.10 (*)    All  other components within normal limits  CBG MONITORING, ED - Abnormal; Notable for the following components:   Glucose-Capillary 116 (*)    All other components within normal limits  ETHANOL  PROTIME-INR  APTT  CBC  DIFFERENTIAL  RAPID URINE DRUG SCREEN, HOSP PERFORMED  URINALYSIS, ROUTINE W REFLEX MICROSCOPIC  I-STAT TROPONIN, ED    EKG EKG Interpretation  Date/Time:  Tuesday September 21 2017 15:29:12 EDT Ventricular Rate:  68 PR Interval:    QRS Duration: 98 QT Interval:  436 QTC Calculation: 464 R Axis:   60 Text Interpretation:  Sinus rhythm Consider left atrial enlargement Abnormal R-wave progression, early transition No significant change since last tracing Confirmed by Dorie Rank [phone]) on 09/21/2017 3:40:17 PM   Radiology Ct Head Wo Contrast  Result Date: 09/21/2017 CLINICAL DATA:  Altered mental status. EXAM: CT HEAD WITHOUT CONTRAST TECHNIQUE: Contiguous axial images were obtained from the base of the skull through the vertex without intravenous contrast. COMPARISON:  CT head and MRI brain 03/18/2017. FINDINGS: Brain: There is a new large area of encephalomalacia within the right occipital lobe consistent with an interval stroke (axial images 13 through 17/2). No evidence of acute intracranial hemorrhage, mass lesion, brain edema or extra-axial fluid collection. Patchy low-density in the periventricular white matter appears stable. The ventricles and subarachnoid spaces are appropriately sized for age. There is no CT evidence of acute cortical infarction. Vascular: Intracranial vascular calcifications. No hyperdense vessel identified.  Skull: Negative for fracture or focal lesion. Sinuses/Orbits: The visualized paranasal sinuses and mastoid air cells are clear. Hypoplastic frontal sinuses. No orbital abnormalities are seen. Other: None. IMPRESSION: 1. No acute intracranial findings. 2. Interval development of a large area of encephalomalacia in the right occipital lobe consistent with a stroke sustained over the last 6 months. This has no definite acute components but could be further evaluated with MRI if clinically warranted. Electronically Signed   By: Richardean Sale M.D.   On: 09/21/2017 14:58    Procedures Procedures (including critical care time)  Medications Ordered in ED Medications  fentaNYL (SUBLIMAZE) injection 50 mcg (has no administration in time range)  ondansetron (ZOFRAN) injection 4 mg (has no administration in time range)  sodium chloride 0.9 % bolus 500 mL (has no administration in time range)     Initial Impression / Assessment and Plan / ED Course  I have reviewed the triage vital signs and the nursing notes.  Pertinent labs & imaging results that were available during my care of the patient were reviewed by me and considered in my medical decision making (see chart for details).     Intake acutely altered, last seen normal yesterday.  History of CVA, suspect may be the same.  Labs unremarkable.  CT head with no definite acute findings, interval development of large area of encephalomalacia in the right occipital lobe, consistent with a stroke sustained over the last 6 months.  Patient's last CVA was was 6 months ago.  Will get MRI, admit to the hospital.  We will also order her some fentanyl for her headache, she appears to be very uncomfortable and holding her head.  Will check temperature, patient appears to warm to the touch.  4:45 PM Spoke with neurology they will come by and see patient.  5:05 PM Patient was seen by neurology, they will perform perfusion study right now.  7:07 PM She had  perfusion studies done.  I spoke with Dr. Cheral Marker again, who explained to me that patient is not a  candidate for intervention due to the small size of the lesion.  I updated the family, will call [facility] for admission.  Patient appears to be in a lot of pain, moaning, screaming out, vomiting.  Family at bedside.  I did order her some fentanyl and Zofran earlier, however she did not receive it per neurology request in case of intervention.  7:27 PM Spoke with [facility], will admit.   Vitals:   09/21/17 1600 09/21/17 1611 09/21/17 1615  BP: (!) 171/69 (!) 175/69 (!) 179/71  Pulse: 68 68 70  Resp: (!) 28 12 10   Temp:  98.2 F (36.8 C)   TempSrc:  Rectal   SpO2: 100% 100% 98%     Final Clinical Impressions(s) / ED Diagnoses   Final diagnoses:  Cerebral infarction due to embolism of precerebral artery [facility])    ED Discharge Orders    None       Jeannett Senior, PA-C 09/21/17 1927    Carmin Muskrat, MD 09/25/17 [phone]

## 2017-09-21 NOTE — ED Provider Notes (Signed)
Patient placed in Quick Look pathway, seen and evaluated   Chief Complaint: altered mental status  HPI:   84 year old female with a past medical history stroke presents today with acute altered mental status. Patient's son is at bedside. He notes he had a full discussion with her last night she was in her usual state of health. He notes no significant deficits other than vision change in the left eye status post last stroke. He notes this morning she was acutely altered, with inappropriate speech, defecation on herself, and altered.  Patient reports her head hurts.  ROS: confusion (one)  Physical Exam:   Gen: distressed  Neuro: Awake   Skin: Warm    Focused Exam: patient unable to follow commands, speech is inappropriate, leaning forward in the exam chair   Initiation of care has begun. The patient has been counseled on the process, plan, and necessity for staying for the completion/evaluation, and the remainder of the medical screening examination    Okey Regal, Hershal Coria 09/21/17 1425    Fredia Sorrow, MD 09/23/17 [phone]

## 2017-09-22 ENCOUNTER — Other Ambulatory Visit: Payer: Self-pay

## 2017-09-22 ENCOUNTER — Inpatient Hospital Stay (HOSPITAL_COMMUNITY): Payer: PPO

## 2017-09-22 DIAGNOSIS — I63412 Cerebral infarction due to embolism of left middle cerebral artery: Secondary | ICD-10-CM

## 2017-09-22 DIAGNOSIS — I480 Paroxysmal atrial fibrillation: Secondary | ICD-10-CM

## 2017-09-22 DIAGNOSIS — I25119 Atherosclerotic heart disease of native coronary artery with unspecified angina pectoris: Secondary | ICD-10-CM

## 2017-09-22 DIAGNOSIS — R569 Unspecified convulsions: Secondary | ICD-10-CM

## 2017-09-22 DIAGNOSIS — R4182 Altered mental status, unspecified: Secondary | ICD-10-CM

## 2017-09-22 DIAGNOSIS — I639 Cerebral infarction, unspecified: Secondary | ICD-10-CM

## 2017-09-22 DIAGNOSIS — Z8679 Personal history of other diseases of the circulatory system: Secondary | ICD-10-CM

## 2017-09-22 LAB — BLOOD GAS, ARTERIAL
ACID-BASE EXCESS: 1.2 mmol/L (ref 0.0–2.0)
BICARBONATE: 25.2 mmol/L (ref 20.0–28.0)
DRAWN BY: 51806
FIO2: 28
O2 Content: 2 L/min
O2 SAT: 96.9 %
Patient temperature: 98.6
pCO2 arterial: 39.8 mmHg (ref 32.0–48.0)
pH, Arterial: 7.417 (ref 7.350–7.450)
pO2, Arterial: 88 mmHg (ref 83.0–108.0)

## 2017-09-22 LAB — LIPID PANEL
Cholesterol: 164 mg/dL (ref 0–200)
HDL: 58 mg/dL (ref >40)
LDL Cholesterol: 95 mg/dL (ref 0–99)
TRIGLYCERIDES: 54 mg/dL (ref <150)
Total CHOL/HDL Ratio: 2.8 RATIO
VLDL: 11 mg/dL (ref 0–40)

## 2017-09-22 LAB — HEMOGLOBIN A1C
HEMOGLOBIN A1C: 5.6 % (ref 4.8–5.6)
MEAN PLASMA GLUCOSE: 114.02 mg/dL

## 2017-09-22 LAB — GLUCOSE, CAPILLARY: GLUCOSE-CAPILLARY: 178 mg/dL — AB (ref 70–99)

## 2017-09-22 MED ORDER — SODIUM CHLORIDE 0.9 % IV SOLN
INTRAVENOUS | Status: DC
  Administered 2017-09-22 – 2017-09-24 (×3): via INTRAVENOUS

## 2017-09-22 MED ORDER — LORAZEPAM 2 MG/ML IJ SOLN
1.0000 mg | Freq: Once | INTRAMUSCULAR | Status: AC
  Administered 2017-09-22: 1 mg via INTRAVENOUS

## 2017-09-22 MED ORDER — NEBIVOLOL HCL 2.5 MG PO TABS
2.5000 mg | ORAL_TABLET | Freq: Every day | ORAL | Status: DC
  Filled 2017-09-22 (×3): qty 1

## 2017-09-22 MED ORDER — LORAZEPAM 2 MG/ML IJ SOLN
INTRAMUSCULAR | Status: AC
  Filled 2017-09-22: qty 1

## 2017-09-22 MED ORDER — LEVETIRACETAM IN NACL 1000 MG/100ML IV SOLN
1000.0000 mg | Freq: Once | INTRAVENOUS | Status: AC
  Administered 2017-09-22: 1000 mg via INTRAVENOUS
  Filled 2017-09-22: qty 100

## 2017-09-22 MED ORDER — ASPIRIN 300 MG RE SUPP
300.0000 mg | Freq: Every day | RECTAL | Status: DC
  Administered 2017-09-22 – 2017-09-24 (×3): 300 mg via RECTAL
  Filled 2017-09-22 (×4): qty 1

## 2017-09-22 MED ORDER — LEVETIRACETAM IN NACL 500 MG/100ML IV SOLN
500.0000 mg | Freq: Two times a day (BID) | INTRAVENOUS | Status: DC
  Administered 2017-09-22 – 2017-09-23 (×4): 500 mg via INTRAVENOUS
  Filled 2017-09-22 (×5): qty 100

## 2017-09-22 NOTE — Progress Notes (Signed)
PT Cancellation Note  Patient Details Name: Affie Gasner MRN: [id number] DOB: 1924/08/02   Cancelled Treatment:    Reason Eval/Treat Not Completed: Medical issues which prohibited therapy;Patient at procedure or test/unavailable  EEG in progress, RN reporting patient very lethargic, minimally responsive. Will follow.  Lanney Gins, PT, DPT 09/22/17 9:29 AM

## 2017-09-22 NOTE — CV Procedure (Signed)
2D echo attempted but patient in MRI

## 2017-09-22 NOTE — Progress Notes (Signed)
Shift event: RN paged NP because pt had witnessed seizure activity by daughter and the RN also saw some of the activity. Daughter states she thought her mom was nauseated because she made a noise with her throat, then her head went back, eyes deviated upwards, with rigid extremities and shaking of head. Neuro was on board for acute CVA, so RN paged them and they gave an order for 1mg  of Ativan.  S: history per daughter because pt is unable to participate in ROS due to mental status.  O: Elderly WF who appears fairly well and in NAD. She is unresponsive, non verbal, and does not follow commands. T 100.6 rectally. Sugar 178. BP 1 teens, HR in 80s now (had an episode of Afib with RVR and run of SVT with the seizure). SaO2 90% on , so switched to 45% ventimask and SaO2 now 98%. Respirations are even and unlabored. Neuro: her right eye reacts to light. Left eye has no pupil. Some spontaneous movement to right arm. Withdraws LEs to pain.  A/P: 1. Witnessed seizure activity-pt had Ativan so is post ictal and sedated. Will update to SDU for tonight. Neuro on board and ordered Keppra load. Will likely need EEG. 2. Acute CVA-neuro w/up in progress. On Plavix.  3. Afib with RVR/SVT-only associated with seizure activity. Now HR normal.  KJKG, NP [facility]  Total critical care time: 60 minutes Critical care time was exclusive of separately billable procedures and treating other patients. Critical care was necessary to treat or prevent imminent or life-threatening deterioration. Critical care was time spent personally by me on the following activities: development of treatment plan with patient and/or surrogate as well as nursing, discussions with consultants, evaluation of patient's response to treatment, examination of patient, obtaining history from patient or surrogate, ordering and performing treatments and interventions, ordering and review of laboratory studies, ordering and review of radiographic studies, pulse  oximetry and re-evaluation of patient's condition.

## 2017-09-22 NOTE — Progress Notes (Signed)
EEG complete - results pending 

## 2017-09-22 NOTE — Progress Notes (Addendum)
OT Cancellation Note  Patient Details Name: [name] [name] MRN: [id number] DOB: [birth year]   Cancelled Treatment:    Reason Eval/Treat Not Completed: Medical issues which prohibited therapy;Patient at procedure or test/ unavailable (EEG), spoke with RN and pt very lethargic, minimally responsive. Will follow.   Of note, per RN pt continues to have increased lethargy this PM and minimally responsive. Currently in MRI. Will continue to follow up for OT eval.  Lou Cal, OT Pager [phone] 09/22/2017   Raymondo Band 09/22/2017, 9:04 AM and 2:34 PM

## 2017-09-22 NOTE — Significant Event (Addendum)
Rapid Response Event Note  Overview: Neurologic   Initial Focused Assessment: Called by RNs about patient having a seizure. Per staff and family, patient appeared to be nauseated, patient's daughter witness the patient's head go back, eyes gazing upward, and bilaterally arm rigidity. Per nurse who was also present, patient's head was shaking. RNs also informed me that patient's HR was in the 190s (SVT or AF RVR).  RN informed me that patient was here for stroke workup and neurology was already consulted. I asked that they page the Neuro MD on call and informed the staff that I was on my way. TR[facility] NP was with me and we went to see patient.  Upon arrival, patient was unresponsive and not able to follow commands, per RN, patient has been like this. Patient withdraws from pain in BLE. At times patient would more right arm spontaneously. Skin flushed and felt very warm to touch, rectal temp of 100.8. SBP 110s, HR now in the 80s and regular, oxygen saturations were 90% on 2L [place]. Currently patient is protecting her airway. Blood sugar was 178.  Interventions: - Neuro MD ordered: Ativan 1mg  IV and Keppra 1,000mg  IV load now, then 500mg  Q12H. - Patient was transitioned to VM 40% 8L, saturations improved to 99%  Plan of Care: - RN to monitor neurologic and respiratory status.  Event Summary:    at    Call Time 0045 Arrival Time 0048 End Time 0145  [name] [name], [place]

## 2017-09-22 NOTE — Progress Notes (Signed)
PROGRESS NOTE        PATIENT DETAILS Name: [name] [name] Age: 84 y.o. Sex: female Date of Birth: 1924-07-12 Admit Date: 09/21/2017 Admitting Physician Vianne Bulls, MD [id number], Butch Penny, MD  Brief Narrative: Patient is a 84 y.o. female with history of left eye blindness, prior history of CVA on Plavix (loop recorder implanted), SVT with confusion, visual disturbances, aphasia.  CT angiogram brain and CT perfusion study confirmed a left M3 occlusion with acute infarct involving the temporal cortical area.  She was admitted to the hospitalist service, but then developed generalized tonic-clonic seizure and A. fib with RVR.  She was started on IV Keppra, she spontaneously converted back to sinus rhythm.  See below for further details  Subjective: Obtunded-does not respond to verbal stimuli-moves all 4 extremities and responds to vigorous sternal rub.  Received 1 mg of IV Ativan last night when she had a seizure.  Assessment/Plan: Acute CVA: Embolic-telemetry/twelve-lead EKG done last night confirmed atrial fibrillation.  Currently remains on antiplatelet agents along with statin-work-up in progress-MRI brain pending this morning.  CT angiogram of the neck did not show any major stenosis.  Await echo-we will await further recommendations from neurology.  Acute metabolic encephalopathy: Secondary to postictal state-and also from IV Ativan.  EEG negative for seizures.  ABG without hypercarbia.  Hopefully she will slowly respond as Ativan wears off from a system.  Neurology following.  PAF: Continue telemetry monitoring-brief run of A. fib last night when she had a seizures.  Will defer timing of initiation of anticoagulants to the neurology service.  History of paroxysmal SVT: Documented on loop recorder data (loop recorder implanted after CVA)-continue telemetry monitoring.  Generalized tonic-clonic seizure: Occurred last night-witnessed by nursing staff.  Given Ativan 1  mg x 1 and subsequently started on Keppra.  Remains obtunded this morning-probably from Ativan, EEG this morning without any obvious seizure-like activity.  Continue Keppra-await further recommendations from neurology.  Hypertension: Allow permissive hypertension-hold antihypertensives currently on hold.  CAD: Currently without any anginal symptoms.  DVT Prophylaxis: Prophylactic Lovenox   Code Status: Full code  Family Communication: Daughter at bedside  Disposition Plan: Remain inpatient-but will plan on Home health vs SNF on discharge  Antimicrobial agents: Anti-infectives (From admission, onward)   None     Procedures: Bibe  CONSULTS:  neurology  Time spent: 35 minutes-Greater than 50% of this time was spent in counseling, explanation of diagnosis, planning of further management, and coordination of care.  MEDICATIONS: Scheduled Meds: . artificial tears  1 application Both Eyes QHS  . atorvastatin  80 mg Oral q1800  . clopidogrel  75 mg Oral Daily  . enoxaparin (LOVENOX) injection  40 mg Subcutaneous Q24H  . fentaNYL (SUBLIMAZE) injection  50 mcg Intravenous Once  . magnesium oxide  200 mg Oral QHS  . multivitamin  1 tablet Oral QPC breakfast  . ondansetron (ZOFRAN) IV  4 mg Intravenous Once  . prednisoLONE acetate  1 drop Both Eyes QHS   Continuous Infusions: . famotidine (PEPCID) IV 20 mg (09/21/17 2152)  . levETIRAcetam 500 mg (09/22/17 0827)   PRN Meds:.acetaminophen **OR** acetaminophen (TYLENOL) oral liquid 160 mg/5 mL **OR** acetaminophen, fentaNYL (SUBLIMAZE) injection, ondansetron (ZOFRAN) IV, polyvinyl alcohol   PHYSICAL EXAM: Vital signs: Vitals:   09/22/17 0557 09/22/17 0819 09/22/17 1000 09/22/17 1131  BP: (!) 110/51 (!) 125/55 (!) 128/57 Marland Kitchen)  124/45  Pulse: 77 73 70 67  Resp: 16 20 20 20   Temp: 98.2 F (36.8 C) (!) 97.5 F (36.4 C) 98.3 F (36.8 C) 98.4 F (36.9 C)  TempSrc: Axillary Oral Axillary Oral  SpO2: 94% 93% 100% 99%  Weight:       Height:       Filed Weights   09/21/17 2055  Weight: 75.9 kg (167 lb 5.3 oz)   Body mass index is 29.64 kg/m.   General appearance: Obtunded-moves all 4 extremities and responds to sternal rub. Eyes:Pink conjunctiva, left cornea appears to be scarred/opaque HEENT: Atraumatic and Normocephalic Neck: supple Resp:Good air entry bilaterally, no added sounds  CVS: S1 S2 regular, no murmurs.  GI: Bowel sounds present, Non tender and not distended with no gaurding, rigidity or rebound.No organomegaly Extremities: B/L Lower Ext shows no edema, both legs are warm to touch Neurology: Difficult exam-but plantars are upgoing on the right, moves all 4 extremities in response to vigorous sternal rub. Musculoskeletal:No digital cyanosis Skin:No Rash, warm and dry Wounds:N/A  I have personally reviewed following labs and imaging studies  LABORATORY DATA: CBC: Recent Labs  Lab 09/21/17 1425 09/21/17 1440  WBC 5.3  --   NEUTROABS 4.2  --   HGB 14.5 15.0  HCT 46.0 44.0  MCV 96.2  --   PLT 197  --     Basic Metabolic Panel: Recent Labs  Lab 09/21/17 1425 09/21/17 1440  NA 142 139  K 4.2 4.1  CL 105 102  CO2 26  --   GLUCOSE 125* 121*  BUN 11 13  CREATININE 0.65 0.60  CALCIUM 9.0  --     GFR: Estimated Creatinine Clearance: 45.6 mL/min (by C-G formula based on SCr of 0.6 mg/dL).  Liver Function Tests: Recent Labs  Lab 09/21/17 1425  AST 31  ALT 25  ALKPHOS 94  BILITOT 0.7  PROT 6.5  ALBUMIN 3.9   No results for input(s): LIPASE, AMYLASE in the last 168 hours. No results for input(s): AMMONIA in the last 168 hours.  Coagulation Profile: Recent Labs  Lab 09/21/17 1425  INR 0.96    Cardiac Enzymes: No results for input(s): CKTOTAL, CKMB, CKMBINDEX, TROPONINI in the last 168 hours.  BNP (last 3 results) No results for input(s): PROBNP in the last 8760 hours.  HbA1C: Recent Labs    09/22/17 0441  HGBA1C 5.6    CBG: Recent Labs  Lab 09/21/17 1435  09/22/17 0047  GLUCAP 116* 178*    Lipid Profile: Recent Labs    09/22/17 0441  CHOL 164  HDL 58  LDLCALC 95  TRIG 54  CHOLHDL 2.8    Thyroid Function Tests: No results for input(s): TSH, T4TOTAL, FREET4, T3FREE, THYROIDAB in the last 72 hours.  Anemia Panel: No results for input(s): VITAMINB12, FOLATE, FERRITIN, TIBC, IRON, RETICCTPCT in the last 72 hours.  Urine analysis:    Component Value Date/Time   COLORURINE YELLOW 09/21/2017 [place] 09/21/2017 1533   LABSPEC 1.013 09/21/2017 1533   PHURINE 7.0 09/21/2017 1533   GLUCOSEU NEGATIVE 09/21/2017 1533   HGBUR NEGATIVE 09/21/2017 1533   BILIRUBINUR NEGATIVE 09/21/2017 1533   KETONESUR 20 (A) 09/21/2017 1533   PROTEINUR NEGATIVE 09/21/2017 1533   UROBILINOGEN 0.2 12/19/2014 1830   NITRITE NEGATIVE 09/21/2017 1533   LEUKOCYTESUR NEGATIVE 09/21/2017 1533    Sepsis Labs: Lactic Acid, Venous    Component Value Date/Time   LATICACIDVEN 1.05 12/23/2012 1836    MICROBIOLOGY: No results  found for this or any previous visit (from the past 240 hour(s)).  RADIOLOGY STUDIES/RESULTS: Ct Angio Head W Or Wo Contrast  Result Date: 09/21/2017 CLINICAL DATA:  Initial evaluation for acute aphasia, stroke. EXAM: CT ANGIOGRAPHY HEAD AND NECK CT PERFUSION BRAIN TECHNIQUE: Multidetector CT imaging of the head and neck was performed using the standard protocol during bolus administration of intravenous contrast. Multiplanar CT image reconstructions and MIPs were obtained to evaluate the vascular anatomy. Carotid stenosis measurements (when applicable) are obtained utilizing NASCET criteria, using the distal internal carotid diameter as the denominator. Multiphase CT imaging of the brain was performed following IV bolus contrast injection. Subsequent parametric perfusion maps were calculated using RAPID software. CONTRAST:  47mL ISOVUE-370 IOPAMIDOL (ISOVUE-370) INJECTION 76% COMPARISON:  Prior CT from earlier the same day.  FINDINGS: CTA NECK FINDINGS Aortic arch: Visualized aortic arch ectatic with normal 3 vessel morphology. Mild atheromatous plaque within the aortic arch. No hemodynamically significant stenosis about the origin of the great vessels. Visualized subclavian arteries tortuous proximally but widely patent without stenosis. Right carotid system: Right common carotid artery tortuous proximally but widely patent to the bifurcation. A centric plaque about the bifurcation without hemodynamically significant stenosis. Right ICA mildly tortuous but widely patent to the skull base without stenosis, dissection, or occlusion. Left carotid system: Left common carotid artery tortuous proximally but widely patent to the bifurcation without stenosis. No significant atheromatous narrowing about the left bifurcation. Left ICA tortuous and partially medialized into the retropharyngeal space. Left ICA patent to the skull base without stenosis, dissection, or occlusion. Vertebral arteries: Both of the vertebral arteries arise from the subclavian arteries. Left vertebral artery dominant. Vertebral arteries tortuous proximally but widely patent to the skull base without stenosis, dissection, or occlusion. Skeleton: No acute osseous abnormality. No worrisome lytic or blastic osseous lesions. Other neck: No acute soft tissue abnormality within the neck. Salivary glands normal. Thyroid normal. No adenopathy. Upper chest: Shotty subcentimeter lymph nodes noted within the visualized upper mediastinum. Scattered atelectatic changes present within the partially visualized lungs. Review of the MIP images confirms the above findings CTA HEAD FINDINGS Anterior circulation: Petrous segments widely patent bilaterally. Mild scattered atheromatous plaque within the cavernous/supraclinoid ICAs without stenosis. ICA termini widely patent. A1 segments patent bilaterally. Left A1 hypoplastic. Normal anterior communicating artery. Anterior cerebral arteries  patent to their distal aspects without flow-limiting stenosis. Left M1 patent without stenosis. Normal left MCA bifurcation. There is a proximal left M3 occlusion rising off the inferior division (series 8, image 96). Adjacent inferior M3 branch remains patent. Superior left MCA division remains patent although distally appears attenuated in somewhat irregular. Right M1 mildly irregular and diffusely narrowed without high-grade or flow-limiting stenosis. Normal right MCA bifurcation. No proximal right M2 occlusion. Distal small vessel atheromatous irregularity present throughout the right MCA branches. Posterior circulation: V4 segments patent to the vertebrobasilar junction without flow-limiting stenosis. Left vertebral artery dominant. Posterior inferior cerebral arteries patent bilaterally. Basilar widely patent to its distal aspect without stenosis. Superior cerebral arteries patent bilaterally. Both of the posterior cerebral arteries supplied via the basilar. Scattered atheromatous irregularity throughout the mid and distal PCAs without high-grade correctable stenosis. Venous sinuses: Not well assessed due to arterial timing of the contrast bolus. Anatomic variants: None significant.  No aneurysm. Delayed phase: Not performed. Review of the MIP images confirms the above findings CT Brain Perfusion Findings: CBF (<30%) Volume: 85mL Perfusion (Tmax>6.0s) volume: 10mL Mismatch Volume: 15mL Infarction Location:Ischemic core infarct involves the left temporoccipital region. Fairly small  amount of surrounding ischemic penumbra. IMPRESSION: 1. Acute left M3 occlusion, likely embolic. Associated core infarct involving the left temporoccipital region with relatively small volume 11 mL surrounding ischemic penumbra. 2. Atheromatous change involving the intracranial circulation, most notable at the right M1 segment and bilateral PCAs. No hemodynamically significant or correctable stenosis identified. 3. Mild for age  atherosclerotic change about the carotid bifurcations without high-grade stenosis. 4. Diffuse tortuosity of the major arterial vasculature of the neck, suggesting chronic underlying hypertension. Critical Value/emergent results were called by telephone at the time of interpretation on 09/21/2017 at 5:59 pm to Dr. Cheral Marker, who verbally acknowledged these results. Electronically Signed   By: Jeannine Boga M.D.   On: 09/21/2017 18:30   Ct Head Wo Contrast  Result Date: 09/21/2017 CLINICAL DATA:  Altered mental status. EXAM: CT HEAD WITHOUT CONTRAST TECHNIQUE: Contiguous axial images were obtained from the base of the skull through the vertex without intravenous contrast. COMPARISON:  CT head and MRI brain 03/18/2017. FINDINGS: Brain: There is a new large area of encephalomalacia within the right occipital lobe consistent with an interval stroke (axial images 13 through 17/2). No evidence of acute intracranial hemorrhage, mass lesion, brain edema or extra-axial fluid collection. Patchy low-density in the periventricular white matter appears stable. The ventricles and subarachnoid spaces are appropriately sized for age. There is no CT evidence of acute cortical infarction. Vascular: Intracranial vascular calcifications. No hyperdense vessel identified. Skull: Negative for fracture or focal lesion. Sinuses/Orbits: The visualized paranasal sinuses and mastoid air cells are clear. Hypoplastic frontal sinuses. No orbital abnormalities are seen. Other: None. IMPRESSION: 1. No acute intracranial findings. 2. Interval development of a large area of encephalomalacia in the right occipital lobe consistent with a stroke sustained over the last 6 months. This has no definite acute components but could be further evaluated with MRI if clinically warranted. Electronically Signed   By: Richardean Sale M.D.   On: 09/21/2017 14:58   Ct Angio Neck W Or Wo Contrast  Result Date: 09/21/2017 CLINICAL DATA:  Initial evaluation for  acute aphasia, stroke. EXAM: CT ANGIOGRAPHY HEAD AND NECK CT PERFUSION BRAIN TECHNIQUE: Multidetector CT imaging of the head and neck was performed using the standard protocol during bolus administration of intravenous contrast. Multiplanar CT image reconstructions and MIPs were obtained to evaluate the vascular anatomy. Carotid stenosis measurements (when applicable) are obtained utilizing NASCET criteria, using the distal internal carotid diameter as the denominator. Multiphase CT imaging of the brain was performed following IV bolus contrast injection. Subsequent parametric perfusion maps were calculated using RAPID software. CONTRAST:  73mL ISOVUE-370 IOPAMIDOL (ISOVUE-370) INJECTION 76% COMPARISON:  Prior CT from earlier the same day. FINDINGS: CTA NECK FINDINGS Aortic arch: Visualized aortic arch ectatic with normal 3 vessel morphology. Mild atheromatous plaque within the aortic arch. No hemodynamically significant stenosis about the origin of the great vessels. Visualized subclavian arteries tortuous proximally but widely patent without stenosis. Right carotid system: Right common carotid artery tortuous proximally but widely patent to the bifurcation. A centric plaque about the bifurcation without hemodynamically significant stenosis. Right ICA mildly tortuous but widely patent to the skull base without stenosis, dissection, or occlusion. Left carotid system: Left common carotid artery tortuous proximally but widely patent to the bifurcation without stenosis. No significant atheromatous narrowing about the left bifurcation. Left ICA tortuous and partially medialized into the retropharyngeal space. Left ICA patent to the skull base without stenosis, dissection, or occlusion. Vertebral arteries: Both of the vertebral arteries arise from the subclavian arteries. Left  vertebral artery dominant. Vertebral arteries tortuous proximally but widely patent to the skull base without stenosis, dissection, or occlusion.  Skeleton: No acute osseous abnormality. No worrisome lytic or blastic osseous lesions. Other neck: No acute soft tissue abnormality within the neck. Salivary glands normal. Thyroid normal. No adenopathy. Upper chest: Shotty subcentimeter lymph nodes noted within the visualized upper mediastinum. Scattered atelectatic changes present within the partially visualized lungs. Review of the MIP images confirms the above findings CTA HEAD FINDINGS Anterior circulation: Petrous segments widely patent bilaterally. Mild scattered atheromatous plaque within the cavernous/supraclinoid ICAs without stenosis. ICA termini widely patent. A1 segments patent bilaterally. Left A1 hypoplastic. Normal anterior communicating artery. Anterior cerebral arteries patent to their distal aspects without flow-limiting stenosis. Left M1 patent without stenosis. Normal left MCA bifurcation. There is a proximal left M3 occlusion rising off the inferior division (series 8, image 96). Adjacent inferior M3 branch remains patent. Superior left MCA division remains patent although distally appears attenuated in somewhat irregular. Right M1 mildly irregular and diffusely narrowed without high-grade or flow-limiting stenosis. Normal right MCA bifurcation. No proximal right M2 occlusion. Distal small vessel atheromatous irregularity present throughout the right MCA branches. Posterior circulation: V4 segments patent to the vertebrobasilar junction without flow-limiting stenosis. Left vertebral artery dominant. Posterior inferior cerebral arteries patent bilaterally. Basilar widely patent to its distal aspect without stenosis. Superior cerebral arteries patent bilaterally. Both of the posterior cerebral arteries supplied via the basilar. Scattered atheromatous irregularity throughout the mid and distal PCAs without high-grade correctable stenosis. Venous sinuses: Not well assessed due to arterial timing of the contrast bolus. Anatomic variants: None  significant.  No aneurysm. Delayed phase: Not performed. Review of the MIP images confirms the above findings CT Brain Perfusion Findings: CBF (<30%) Volume: 81mL Perfusion (Tmax>6.0s) volume: 35mL Mismatch Volume: 6mL Infarction Location:Ischemic core infarct involves the left temporoccipital region. Fairly small amount of surrounding ischemic penumbra. IMPRESSION: 1. Acute left M3 occlusion, likely embolic. Associated core infarct involving the left temporoccipital region with relatively small volume 11 mL surrounding ischemic penumbra. 2. Atheromatous change involving the intracranial circulation, most notable at the right M1 segment and bilateral PCAs. No hemodynamically significant or correctable stenosis identified. 3. Mild for age atherosclerotic change about the carotid bifurcations without high-grade stenosis. 4. Diffuse tortuosity of the major arterial vasculature of the neck, suggesting chronic underlying hypertension. Critical Value/emergent results were called by telephone at the time of interpretation on 09/21/2017 at 5:59 pm to Dr. Cheral Marker, who verbally acknowledged these results. Electronically Signed   By: Jeannine Boga M.D.   On: 09/21/2017 18:30   Ct Cerebral Perfusion W Contrast  Result Date: 09/21/2017 CLINICAL DATA:  Initial evaluation for acute aphasia, stroke. EXAM: CT ANGIOGRAPHY HEAD AND NECK CT PERFUSION BRAIN TECHNIQUE: Multidetector CT imaging of the head and neck was performed using the standard protocol during bolus administration of intravenous contrast. Multiplanar CT image reconstructions and MIPs were obtained to evaluate the vascular anatomy. Carotid stenosis measurements (when applicable) are obtained utilizing NASCET criteria, using the distal internal carotid diameter as the denominator. Multiphase CT imaging of the brain was performed following IV bolus contrast injection. Subsequent parametric perfusion maps were calculated using RAPID software. CONTRAST:  79mL  ISOVUE-370 IOPAMIDOL (ISOVUE-370) INJECTION 76% COMPARISON:  Prior CT from earlier the same day. FINDINGS: CTA NECK FINDINGS Aortic arch: Visualized aortic arch ectatic with normal 3 vessel morphology. Mild atheromatous plaque within the aortic arch. No hemodynamically significant stenosis about the origin of the great vessels. Visualized subclavian arteries tortuous  proximally but widely patent without stenosis. Right carotid system: Right common carotid artery tortuous proximally but widely patent to the bifurcation. A centric plaque about the bifurcation without hemodynamically significant stenosis. Right ICA mildly tortuous but widely patent to the skull base without stenosis, dissection, or occlusion. Left carotid system: Left common carotid artery tortuous proximally but widely patent to the bifurcation without stenosis. No significant atheromatous narrowing about the left bifurcation. Left ICA tortuous and partially medialized into the retropharyngeal space. Left ICA patent to the skull base without stenosis, dissection, or occlusion. Vertebral arteries: Both of the vertebral arteries arise from the subclavian arteries. Left vertebral artery dominant. Vertebral arteries tortuous proximally but widely patent to the skull base without stenosis, dissection, or occlusion. Skeleton: No acute osseous abnormality. No worrisome lytic or blastic osseous lesions. Other neck: No acute soft tissue abnormality within the neck. Salivary glands normal. Thyroid normal. No adenopathy. Upper chest: Shotty subcentimeter lymph nodes noted within the visualized upper mediastinum. Scattered atelectatic changes present within the partially visualized lungs. Review of the MIP images confirms the above findings CTA HEAD FINDINGS Anterior circulation: Petrous segments widely patent bilaterally. Mild scattered atheromatous plaque within the cavernous/supraclinoid ICAs without stenosis. ICA termini widely patent. A1 segments patent  bilaterally. Left A1 hypoplastic. Normal anterior communicating artery. Anterior cerebral arteries patent to their distal aspects without flow-limiting stenosis. Left M1 patent without stenosis. Normal left MCA bifurcation. There is a proximal left M3 occlusion rising off the inferior division (series 8, image 96). Adjacent inferior M3 branch remains patent. Superior left MCA division remains patent although distally appears attenuated in somewhat irregular. Right M1 mildly irregular and diffusely narrowed without high-grade or flow-limiting stenosis. Normal right MCA bifurcation. No proximal right M2 occlusion. Distal small vessel atheromatous irregularity present throughout the right MCA branches. Posterior circulation: V4 segments patent to the vertebrobasilar junction without flow-limiting stenosis. Left vertebral artery dominant. Posterior inferior cerebral arteries patent bilaterally. Basilar widely patent to its distal aspect without stenosis. Superior cerebral arteries patent bilaterally. Both of the posterior cerebral arteries supplied via the basilar. Scattered atheromatous irregularity throughout the mid and distal PCAs without high-grade correctable stenosis. Venous sinuses: Not well assessed due to arterial timing of the contrast bolus. Anatomic variants: None significant.  No aneurysm. Delayed phase: Not performed. Review of the MIP images confirms the above findings CT Brain Perfusion Findings: CBF (<30%) Volume: 67mL Perfusion (Tmax>6.0s) volume: 89mL Mismatch Volume: 24mL Infarction Location:Ischemic core infarct involves the left temporoccipital region. Fairly small amount of surrounding ischemic penumbra. IMPRESSION: 1. Acute left M3 occlusion, likely embolic. Associated core infarct involving the left temporoccipital region with relatively small volume 11 mL surrounding ischemic penumbra. 2. Atheromatous change involving the intracranial circulation, most notable at the right M1 segment and  bilateral PCAs. No hemodynamically significant or correctable stenosis identified. 3. Mild for age atherosclerotic change about the carotid bifurcations without high-grade stenosis. 4. Diffuse tortuosity of the major arterial vasculature of the neck, suggesting chronic underlying hypertension. Critical Value/emergent results were called by telephone at the time of interpretation on 09/21/2017 at 5:59 pm to Dr. Cheral Marker, who verbally acknowledged these results. Electronically Signed   By: Jeannine Boga M.D.   On: 09/21/2017 18:30     LOS: 1 day   Oren Binet, MD  [facility]  If 7PM-7AM, please contact night-coverage  Please page via [email or web address]-click on MD name and type text message  09/22/2017, 1:23 PM

## 2017-09-22 NOTE — Progress Notes (Addendum)
STROKE TEAM PROGRESS NOTE   INTERVAL HISTORY Her son is at the bedside.  She had a seizure during the night, treated with ativan and keppra. EEG pending. Pt not waking this am - will rouse a little, move all extremities, but not conversant or open eyes independently.  Vitals:   09/22/17 0200 09/22/17 0400 09/22/17 0557 09/22/17 0819  BP: (!) 120/52 (!) 141/68 (!) 110/51 (!) 125/55  Pulse: 83 85 77 73  Resp: 16 16 16 20   Temp:  98.1 F (36.7 C) 98.2 F (36.8 C) (!) 97.5 F (36.4 C)  TempSrc:  Axillary Axillary Oral  SpO2: 100% 100% 94% 93%  Weight:      Height:        CBC:  Recent Labs  Lab 09/21/17 1425 09/21/17 1440  WBC 5.3  --   NEUTROABS 4.2  --   HGB 14.5 15.0  HCT 46.0 44.0  MCV 96.2  --   PLT 197  --     Basic Metabolic Panel:  Recent Labs  Lab 09/21/17 1425 09/21/17 1440  NA 142 139  K 4.2 4.1  CL 105 102  CO2 26  --   GLUCOSE 125* 121*  BUN 11 13  CREATININE 0.65 0.60  CALCIUM 9.0  --    Lipid Panel:     Component Value Date/Time   CHOL 164 09/22/2017 0441   TRIG 54 09/22/2017 0441   HDL 58 09/22/2017 0441   CHOLHDL 2.8 09/22/2017 0441   VLDL 11 09/22/2017 0441   LDLCALC 95 09/22/2017 0441   HgbA1c:  Lab Results  Component Value Date   HGBA1C 5.6 09/22/2017   Urine Drug Screen:     Component Value Date/Time   LABOPIA NONE DETECTED 09/21/2017 1533   COCAINSCRNUR NONE DETECTED 09/21/2017 1533   LABBENZ NONE DETECTED 09/21/2017 1533   AMPHETMU NONE DETECTED 09/21/2017 1533   THCU NONE DETECTED 09/21/2017 1533   LABBARB (A) 09/21/2017 1533    Result not available. Reagent lot number recalled by manufacturer.    Alcohol Level     Component Value Date/Time   ETH <10 09/21/2017 1425    IMAGING Ct Angio Head W Or Wo Contrast  Result Date: 09/21/2017 CLINICAL DATA:  Initial evaluation for acute aphasia, stroke. EXAM: CT ANGIOGRAPHY HEAD AND NECK CT PERFUSION BRAIN TECHNIQUE: Multidetector CT imaging of the head and neck was performed  using the standard protocol during bolus administration of intravenous contrast. Multiplanar CT image reconstructions and MIPs were obtained to evaluate the vascular anatomy. Carotid stenosis measurements (when applicable) are obtained utilizing NASCET criteria, using the distal internal carotid diameter as the denominator. Multiphase CT imaging of the brain was performed following IV bolus contrast injection. Subsequent parametric perfusion maps were calculated using RAPID software. CONTRAST:  62m ISOVUE-370 IOPAMIDOL (ISOVUE-370) INJECTION 76% COMPARISON:  Prior CT from earlier the same day. FINDINGS: CTA NECK FINDINGS Aortic arch: Visualized aortic arch ectatic with normal 3 vessel morphology. Mild atheromatous plaque within the aortic arch. No hemodynamically significant stenosis about the origin of the great vessels. Visualized subclavian arteries tortuous proximally but widely patent without stenosis. Right carotid system: Right common carotid artery tortuous proximally but widely patent to the bifurcation. A centric plaque about the bifurcation without hemodynamically significant stenosis. Right ICA mildly tortuous but widely patent to the skull base without stenosis, dissection, or occlusion. Left carotid system: Left common carotid artery tortuous proximally but widely patent to the bifurcation without stenosis. No significant atheromatous narrowing about the left bifurcation. Left  ICA tortuous and partially medialized into the retropharyngeal space. Left ICA patent to the skull base without stenosis, dissection, or occlusion. Vertebral arteries: Both of the vertebral arteries arise from the subclavian arteries. Left vertebral artery dominant. Vertebral arteries tortuous proximally but widely patent to the skull base without stenosis, dissection, or occlusion. Skeleton: No acute osseous abnormality. No worrisome lytic or blastic osseous lesions. Other neck: No acute soft tissue abnormality within the neck.  Salivary glands normal. Thyroid normal. No adenopathy. Upper chest: Shotty subcentimeter lymph nodes noted within the visualized upper mediastinum. Scattered atelectatic changes present within the partially visualized lungs. Review of the MIP images confirms the above findings CTA HEAD FINDINGS Anterior circulation: Petrous segments widely patent bilaterally. Mild scattered atheromatous plaque within the cavernous/supraclinoid ICAs without stenosis. ICA termini widely patent. A1 segments patent bilaterally. Left A1 hypoplastic. Normal anterior communicating artery. Anterior cerebral arteries patent to their distal aspects without flow-limiting stenosis. Left M1 patent without stenosis. Normal left MCA bifurcation. There is a proximal left M3 occlusion rising off the inferior division (series 8, image 96). Adjacent inferior M3 branch remains patent. Superior left MCA division remains patent although distally appears attenuated in somewhat irregular. Right M1 mildly irregular and diffusely narrowed without high-grade or flow-limiting stenosis. Normal right MCA bifurcation. No proximal right M2 occlusion. Distal small vessel atheromatous irregularity present throughout the right MCA branches. Posterior circulation: V4 segments patent to the vertebrobasilar junction without flow-limiting stenosis. Left vertebral artery dominant. Posterior inferior cerebral arteries patent bilaterally. Basilar widely patent to its distal aspect without stenosis. Superior cerebral arteries patent bilaterally. Both of the posterior cerebral arteries supplied via the basilar. Scattered atheromatous irregularity throughout the mid and distal PCAs without high-grade correctable stenosis. Venous sinuses: Not well assessed due to arterial timing of the contrast bolus. Anatomic variants: None significant.  No aneurysm. Delayed phase: Not performed. Review of the MIP images confirms the above findings CT Brain Perfusion Findings: CBF (<30%)  Volume: 11m Perfusion (Tmax>6.0s) volume: 462mMismatch Volume: 1137mnfarction Location:Ischemic core infarct involves the left temporoccipital region. Fairly small amount of surrounding ischemic penumbra. IMPRESSION: 1. Acute left M3 occlusion, likely embolic. Associated core infarct involving the left temporoccipital region with relatively small volume 11 mL surrounding ischemic penumbra. 2. Atheromatous change involving the intracranial circulation, most notable at the right M1 segment and bilateral PCAs. No hemodynamically significant or correctable stenosis identified. 3. Mild for age atherosclerotic change about the carotid bifurcations without high-grade stenosis. 4. Diffuse tortuosity of the major arterial vasculature of the neck, suggesting chronic underlying hypertension. Critical Value/emergent results were called by telephone at the time of interpretation on 09/21/2017 at 5:59 pm to Dr. LinCheral Markerho verbally acknowledged these results. Electronically Signed   By: BenJeannine BogaD.   On: 09/21/2017 18:30   Ct Head Wo Contrast  Result Date: 09/21/2017 CLINICAL DATA:  Altered mental status. EXAM: CT HEAD WITHOUT CONTRAST TECHNIQUE: Contiguous axial images were obtained from the base of the skull through the vertex without intravenous contrast. COMPARISON:  CT head and MRI brain 03/18/2017. FINDINGS: Brain: There is a new large area of encephalomalacia within the right occipital lobe consistent with an interval stroke (axial images 13 through 17/2). No evidence of acute intracranial hemorrhage, mass lesion, brain edema or extra-axial fluid collection. Patchy low-density in the periventricular white matter appears stable. The ventricles and subarachnoid spaces are appropriately sized for age. There is no CT evidence of acute cortical infarction. Vascular: Intracranial vascular calcifications. No hyperdense vessel identified. Skull: Negative for  fracture or focal lesion. Sinuses/Orbits: The  visualized paranasal sinuses and mastoid air cells are clear. Hypoplastic frontal sinuses. No orbital abnormalities are seen. Other: None. IMPRESSION: 1. No acute intracranial findings. 2. Interval development of a large area of encephalomalacia in the right occipital lobe consistent with a stroke sustained over the last 6 months. This has no definite acute components but could be further evaluated with MRI if clinically warranted. Electronically Signed   By: Richardean Sale M.D.   On: 09/21/2017 14:58   Ct Angio Neck W Or Wo Contrast  Result Date: 09/21/2017 CLINICAL DATA:  Initial evaluation for acute aphasia, stroke. EXAM: CT ANGIOGRAPHY HEAD AND NECK CT PERFUSION BRAIN TECHNIQUE: Multidetector CT imaging of the head and neck was performed using the standard protocol during bolus administration of intravenous contrast. Multiplanar CT image reconstructions and MIPs were obtained to evaluate the vascular anatomy. Carotid stenosis measurements (when applicable) are obtained utilizing NASCET criteria, using the distal internal carotid diameter as the denominator. Multiphase CT imaging of the brain was performed following IV bolus contrast injection. Subsequent parametric perfusion maps were calculated using RAPID software. CONTRAST:  35m ISOVUE-370 IOPAMIDOL (ISOVUE-370) INJECTION 76% COMPARISON:  Prior CT from earlier the same day. FINDINGS: CTA NECK FINDINGS Aortic arch: Visualized aortic arch ectatic with normal 3 vessel morphology. Mild atheromatous plaque within the aortic arch. No hemodynamically significant stenosis about the origin of the great vessels. Visualized subclavian arteries tortuous proximally but widely patent without stenosis. Right carotid system: Right common carotid artery tortuous proximally but widely patent to the bifurcation. A centric plaque about the bifurcation without hemodynamically significant stenosis. Right ICA mildly tortuous but widely patent to the skull base without  stenosis, dissection, or occlusion. Left carotid system: Left common carotid artery tortuous proximally but widely patent to the bifurcation without stenosis. No significant atheromatous narrowing about the left bifurcation. Left ICA tortuous and partially medialized into the retropharyngeal space. Left ICA patent to the skull base without stenosis, dissection, or occlusion. Vertebral arteries: Both of the vertebral arteries arise from the subclavian arteries. Left vertebral artery dominant. Vertebral arteries tortuous proximally but widely patent to the skull base without stenosis, dissection, or occlusion. Skeleton: No acute osseous abnormality. No worrisome lytic or blastic osseous lesions. Other neck: No acute soft tissue abnormality within the neck. Salivary glands normal. Thyroid normal. No adenopathy. Upper chest: Shotty subcentimeter lymph nodes noted within the visualized upper mediastinum. Scattered atelectatic changes present within the partially visualized lungs. Review of the MIP images confirms the above findings CTA HEAD FINDINGS Anterior circulation: Petrous segments widely patent bilaterally. Mild scattered atheromatous plaque within the cavernous/supraclinoid ICAs without stenosis. ICA termini widely patent. A1 segments patent bilaterally. Left A1 hypoplastic. Normal anterior communicating artery. Anterior cerebral arteries patent to their distal aspects without flow-limiting stenosis. Left M1 patent without stenosis. Normal left MCA bifurcation. There is a proximal left M3 occlusion rising off the inferior division (series 8, image 96). Adjacent inferior M3 branch remains patent. Superior left MCA division remains patent although distally appears attenuated in somewhat irregular. Right M1 mildly irregular and diffusely narrowed without high-grade or flow-limiting stenosis. Normal right MCA bifurcation. No proximal right M2 occlusion. Distal small vessel atheromatous irregularity present throughout  the right MCA branches. Posterior circulation: V4 segments patent to the vertebrobasilar junction without flow-limiting stenosis. Left vertebral artery dominant. Posterior inferior cerebral arteries patent bilaterally. Basilar widely patent to its distal aspect without stenosis. Superior cerebral arteries patent bilaterally. Both of the posterior cerebral arteries supplied via the  basilar. Scattered atheromatous irregularity throughout the mid and distal PCAs without high-grade correctable stenosis. Venous sinuses: Not well assessed due to arterial timing of the contrast bolus. Anatomic variants: None significant.  No aneurysm. Delayed phase: Not performed. Review of the MIP images confirms the above findings CT Brain Perfusion Findings: CBF (<30%) Volume: 34m Perfusion (Tmax>6.0s) volume: 445mMismatch Volume: 1162mnfarction Location:Ischemic core infarct involves the left temporoccipital region. Fairly small amount of surrounding ischemic penumbra. IMPRESSION: 1. Acute left M3 occlusion, likely embolic. Associated core infarct involving the left temporoccipital region with relatively small volume 11 mL surrounding ischemic penumbra. 2. Atheromatous change involving the intracranial circulation, most notable at the right M1 segment and bilateral PCAs. No hemodynamically significant or correctable stenosis identified. 3. Mild for age atherosclerotic change about the carotid bifurcations without high-grade stenosis. 4. Diffuse tortuosity of the major arterial vasculature of the neck, suggesting chronic underlying hypertension. Critical Value/emergent results were called by telephone at the time of interpretation on 09/21/2017 at 5:59 pm to Dr. LinCheral Markerho verbally acknowledged these results. Electronically Signed   By: BenJeannine BogaD.   On: 09/21/2017 18:30   Ct Cerebral Perfusion W Contrast  Result Date: 09/21/2017 CLINICAL DATA:  Initial evaluation for acute aphasia, stroke. EXAM: CT ANGIOGRAPHY HEAD  AND NECK CT PERFUSION BRAIN TECHNIQUE: Multidetector CT imaging of the head and neck was performed using the standard protocol during bolus administration of intravenous contrast. Multiplanar CT image reconstructions and MIPs were obtained to evaluate the vascular anatomy. Carotid stenosis measurements (when applicable) are obtained utilizing NASCET criteria, using the distal internal carotid diameter as the denominator. Multiphase CT imaging of the brain was performed following IV bolus contrast injection. Subsequent parametric perfusion maps were calculated using RAPID software. CONTRAST:  39m52mOVUE-370 IOPAMIDOL (ISOVUE-370) INJECTION 76% COMPARISON:  Prior CT from earlier the same day. FINDINGS: CTA NECK FINDINGS Aortic arch: Visualized aortic arch ectatic with normal 3 vessel morphology. Mild atheromatous plaque within the aortic arch. No hemodynamically significant stenosis about the origin of the great vessels. Visualized subclavian arteries tortuous proximally but widely patent without stenosis. Right carotid system: Right common carotid artery tortuous proximally but widely patent to the bifurcation. A centric plaque about the bifurcation without hemodynamically significant stenosis. Right ICA mildly tortuous but widely patent to the skull base without stenosis, dissection, or occlusion. Left carotid system: Left common carotid artery tortuous proximally but widely patent to the bifurcation without stenosis. No significant atheromatous narrowing about the left bifurcation. Left ICA tortuous and partially medialized into the retropharyngeal space. Left ICA patent to the skull base without stenosis, dissection, or occlusion. Vertebral arteries: Both of the vertebral arteries arise from the subclavian arteries. Left vertebral artery dominant. Vertebral arteries tortuous proximally but widely patent to the skull base without stenosis, dissection, or occlusion. Skeleton: No acute osseous abnormality. No  worrisome lytic or blastic osseous lesions. Other neck: No acute soft tissue abnormality within the neck. Salivary glands normal. Thyroid normal. No adenopathy. Upper chest: Shotty subcentimeter lymph nodes noted within the visualized upper mediastinum. Scattered atelectatic changes present within the partially visualized lungs. Review of the MIP images confirms the above findings CTA HEAD FINDINGS Anterior circulation: Petrous segments widely patent bilaterally. Mild scattered atheromatous plaque within the cavernous/supraclinoid ICAs without stenosis. ICA termini widely patent. A1 segments patent bilaterally. Left A1 hypoplastic. Normal anterior communicating artery. Anterior cerebral arteries patent to their distal aspects without flow-limiting stenosis. Left M1 patent without stenosis. Normal left MCA bifurcation. There is a proximal left M3  occlusion rising off the inferior division (series 8, image 96). Adjacent inferior M3 branch remains patent. Superior left MCA division remains patent although distally appears attenuated in somewhat irregular. Right M1 mildly irregular and diffusely narrowed without high-grade or flow-limiting stenosis. Normal right MCA bifurcation. No proximal right M2 occlusion. Distal small vessel atheromatous irregularity present throughout the right MCA branches. Posterior circulation: V4 segments patent to the vertebrobasilar junction without flow-limiting stenosis. Left vertebral artery dominant. Posterior inferior cerebral arteries patent bilaterally. Basilar widely patent to its distal aspect without stenosis. Superior cerebral arteries patent bilaterally. Both of the posterior cerebral arteries supplied via the basilar. Scattered atheromatous irregularity throughout the mid and distal PCAs without high-grade correctable stenosis. Venous sinuses: Not well assessed due to arterial timing of the contrast bolus. Anatomic variants: None significant.  No aneurysm. Delayed phase: Not  performed. Review of the MIP images confirms the above findings CT Brain Perfusion Findings: CBF (<30%) Volume: 69m Perfusion (Tmax>6.0s) volume: 468mMismatch Volume: 1157mnfarction Location:Ischemic core infarct involves the left temporoccipital region. Fairly small amount of surrounding ischemic penumbra. IMPRESSION: 1. Acute left M3 occlusion, likely embolic. Associated core infarct involving the left temporoccipital region with relatively small volume 11 mL surrounding ischemic penumbra. 2. Atheromatous change involving the intracranial circulation, most notable at the right M1 segment and bilateral PCAs. No hemodynamically significant or correctable stenosis identified. 3. Mild for age atherosclerotic change about the carotid bifurcations without high-grade stenosis. 4. Diffuse tortuosity of the major arterial vasculature of the neck, suggesting chronic underlying hypertension. Critical Value/emergent results were called by telephone at the time of interpretation on 09/21/2017 at 5:59 pm to Dr. LinCheral Markerho verbally acknowledged these results. Electronically Signed   By: BenJeannine BogaD.   On: 09/21/2017 18:30   2D echocardiogram Pending  EEG  pending   PHYSICAL EXAM HEENT-  Normocephalic, no lesions, without obvious abnormality.  Normal external eye and conjunctiva.   Extremities- Warm, dry and intact Musculoskeletal-no joint tenderness, deformity or swelling Skin-warm and dry, no hyperpigmentation, vitiligo, or suspicious lesions Chest CTA, HRR  Neurological Examination Mental Status: Patient is stuporous. Mute. Reactive to stimulation but will not waken or maintain stimulated state. No obvious distress.  Cranial Nerves: II: blind left eye with cloudy cornea, done not blink to threat on the L or on the right.  III,IV, VI: Doll's appear intact on the right eye V,VII: mild R facial droop  Motor: Moves all extremities equally to stimulation/pain Sensory: withdraws to  pain Deep Tendon Reflexes: 1+ in the upper extremities no knee jerk and no ankle jerk Plantars: Right: downgoing                                Left: downgoing Cerebellar: Not able to test Gait: Not tested   ASSESSMENT/PLAN [name] a [age]. female with history of hypertension, SVT, CHF, CAD, legally blind left eye, prior strokes on Plavix with loop recorder that has shown 10 seconds of A. fib in past presenting with global aphasia and right homonymous hemianopsia.   Stroke:  left temporal infarct felt to be embolic secondary to atrial fibrillation   CT head no acute findings.  Interval right occipital stroke past 6 months.  CTA head & neck with perfusion  Acute L M3 occlusion. Associated left temporal occipital infarct with surrounding penumbra.  Intracranial atherosclerosis are M1 and the PCAs.  Carotid atherosclerosis.  Tortuosity of vessels in the neck  MRI  pending   2D Echo  pending   LDL 95  HgbA1c 5.6  Lovenox 40 mg subcu daily for VTE prophylaxis Diet Order           Diet NPO time specified  Diet effective now           clopidogrel 75 mg daily prior to admission, now on clopidogrel 75 mg daily. Given inability to swallow, will change plavix to aspirin suppository. Long term recommendation for anticoagulation with Eliquis (see below under atrial fibrillation )   Therapy recommendations:  pending   Disposition:  pending   Seizure   New onset during the night 7/3  seizure felt to be secondary to current stroke given temporal location  treated with Ativan and Keppra   Today remains sleepy, difficulty to arrouse  EEG pending  ABG normal (lethargy not respiratory related)  Atrial fibrillation  Loop recorder has shown 10 seconds of atrial fibrillation in the past.    Most recent reading in June automatic reading showed atrial fibrillation, physician determined PACs   Reported atrial fibrillation with RVR/SVT associated with seizure activity    loop recorder interrogation - unable to link to loop recorder. Anticipate it may be at end-of-life as it was placed in the Fall of 2015. Last interrogated in May 2019  Anticoagulation with Eliquis recommended, however, stroke of moderate size precludes initiation of Eliquis at this time  Hypertension  Variable but within limits . Permissive hypertension (OK if < 220/120) but gradually normalize in 5-7 days . Long-term BP goal normotensive  Hyperlipidemia  Home meds: No statin  Now on Lipitor 80 mg daily  LDL 95, goal < 70  Continue statin at discharge  Diabetes type II  HgbA1c 5.6, goal < 7.0  Controlled  Other Stroke Risk Factors  Advanced age  Hx stroke/TIA  12/2013-small L frontal cortical infarct (R arm weakness).  Embolic.  Etiology unclear.  R PCA moderate stenosis.  Loop recorder placed.  01/2015-loop recorder shows 10 seconds atrial fibrillation, SVT.  Not placed on anticoagulation  02/2017-right midbrain infarct (L facial droop and left lip numbness) secondary to small vessel disease.  CTA head and neck showed a right PE ICA occlusion and left P2 stenosis.  Old left frontal and bilateral parietal infarcts.  Placed on DAPT to Plavix alone.  Since 02/2017-interval development of left homonymous hemianopia.  CT now shows old not present right PCA infarct in December MRI  Family hx stroke (mother)  Coronary artery disease - MI, stent  Hospital day # [place], MSN, APRN, ANVP-BC, AGPCNP-BC Advanced Practice Stroke Nurse [place] for Schedule & Pager information 09/22/2017 2:50 PM   ATTENDING NOTE: I reviewed above note and agree with the assessment and plan. I have made any additions or clarifications directly to the above note. Pt was seen and examined.   84 y.o. female with history of hypertension, SVT, CHF, CAD, left eye legally blind and strokes on Plavix with residue left hemianopia admitted for  global aphasia and  right hemianopia.  Due to left eye blindness and old left hemianopia and a new right hemianopia, patient basically blind at this time.  CT head no acute abnormality but chronic right PCA infarct. CTA head and neck showed left M3 cut off and  CT perfusion showed left temporoparietal cortical infarct without salvageable tissue.  Overnight, patient had a seizure activity treated with Ativan and Keppra.  Patient still somnolent and obtunded this morning, ABG  negative, EEG diffuse slowing with occasional focal slowing over the left temporal region.  MRI not able to perform due to agitation.  2D echo pending.  LDL 95 and A1c 5.6.   She had stroke in 12/2013 at left high frontal cortex infarct. MRA showed right PCA moderate stenosis. Carotid Doppler, TTE, DVT negative. LDL 91 elevated A1c 6.0. Loop recorder placed. In 01/2015, she was found to have A. fib lasting 10 seconds, however frequent intermittent SVTs. She follows Dr. Burt Knack for SVT management, on bystolic. The most recent loop recorder interrogation showed 4AF episodes (<1% burden) - available EGMs show PACs.  She was admitted in 02/2018 for left facial droop and left lip numbness. CTA head and neck right PCA occlusion and left P2 stenosis. 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 LDL 118, A1c 5.3. EF 55-60%. Put on DAPT for 3 weeks and then Plavix alone. Continue Lipitor 40.  However, as per family, patient developed left hemianopia with the stroke.  Current CT showed right chronic PCA infarct, likely related to last stroke.  During the seizure activity overnight, patient developed atrial fibrillation on telemetry monitoring, which spontaneously converted to normal sinus rhythm after seizure.  Cardiology consulted, agree with PAF, recommended anticoagulation with Eliquis once appropriate for stroke prevention.  Currently on aspirin PR due to n.p.o. Status. Will  follow.  Rosalin Hawking, MD PhD Stroke Neurology 09/22/2017 4:30 PM  I spent  35 minutes in total face-to-face time with the patient, more than 50% of which was spent in counseling and coordination of care, reviewing test results, images and medication, and discussing the diagnosis of recurrent stroke, seizure, PAF, treatment plan and potential prognosis. This patient's care requiresreview of multiple databases, neurological assessment, discussion with family, other specialists and medical decision making of high complexity.    To contact Stroke Continuity provider, please refer to [email or web address] After hours, contact General Neurology

## 2017-09-22 NOTE — Procedures (Signed)
ELECTROENCEPHALOGRAM REPORT  Date of Study: 09/22/2017  Patient's Name: Mirai Greenwood MRN: [id number] Date of Birth: [birth year]  Referring Provider: Dr. Oren Binet  Clinical History: This is a 84 year old woman with altered mental status.  Medications: Keppra  Technical Summary: A multichannel digital EEG recording measured by the international 10-20 system with electrodes applied with paste and impedances below 5000 ohms performed as portable with EKG monitoring in an unresponsive patient.  Hyperventilation and photic stimulation were not performed.  The digital EEG was referentially recorded, reformatted, and digitally filtered in a variety of bipolar and referential montages for optimal display.   Description: The patient is unresponsive during the recording.  There is no clear posterior dominant rhythm seen. The background consists of a moderate amount of 4-7 Hz theta and occasional diffuse 2-3 Hz delta slowing. There is occasional additional focal theta and delta slowing over the left temporal region. During drowsiness and sleep, poorly formed vertex waves are seen. With noxious stimulation, there is slight increase in faster frequencies. Hyperventilation and photic stimulation were not performed.  There were no epileptiform discharges or electrographic seizures seen.    EKG lead was unremarkable.  Impression: This EEG is abnormal due to the presence of: 1. Moderate diffuse background slowing 2. Occasional focal slowing over the left temporal region  Clinical Correlation of the above findings indicates diffuse cerebral dysfunction that is non-specific in etiology and can be seen with hypoxic/ischemic injury, toxic/metabolic encephalopathies, neurodegenerative disorders, or medication effect. Focal slowing over the left temporal region indicates focal cerebral dysfunction in this region suggestive of underlying structural or physiologic abnormality. The absence of epileptiform  discharges does not rule out a clinical diagnosis of epilepsy.  Clinical correlation is advised.   Ellouise Newer, M.D.

## 2017-09-22 NOTE — Progress Notes (Signed)
SLP Cancellation Note  Patient Details Name: [name] [name] MRN: [id number] DOB: [birth year]   Cancelled treatment:       Reason Eval/Treat Not Completed: Patient's level of consciousness. Per noted pt not appropriate for SLP assessment at this time.   Herbie [place], [place] CCC-SLP [phone]  [name] 09/22/2017, 2:12 PM

## 2017-09-22 NOTE — Progress Notes (Signed)
Patient appears somnolent, able to move extremities involuntarily. MD aware of pt's mental status this AM. Will continue to monitor closely.   Ave Filter, RN

## 2017-09-22 NOTE — Progress Notes (Signed)
Attempted MRI. Patient was trying to sit up, take gown off, and get out of bed. Unable to complete exam at this time. Dr Erlinda Hong and RN notified. Patient sent back.

## 2017-09-22 NOTE — Consult Note (Addendum)
Cardiology Consultation:   Patient ID: [name] [name]; [id number]; 1924/04/13   Admit date: 09/21/2017 Date of Consult: 09/22/2017  Primary Care Provider: Darcus Austin, MD Primary Cardiologist: [name], MD Primary Electrophysiologist:  None   Patient Profile:   [name] [name] is a 84 y.o. female with a PMH of CAD s/p NSTEMI 2015 (medical management), HTN, SVT, CVA with subsequent left eye blindness, who presented to the ED 09/21/17 with confusion, aphasia, and vision loss, and was found to have an acute embolic stroke, and is being seen today for the evaluation of atrial fibrillation at the request of Dr. Sloan Leiter.  History of Present Illness:   [name] [name] was in her usual state of health until sometime during the day on 09/21/17. Family reports visiting her in the early afternoon and upon entering the house, they found her standing with arms outstretched as if she couldn't see and was trying to feel her way around the house. They noted aphasia and confusion prompting them to bring her to the emergency room.   She was last seen by Dr. Burt Knack outpatient 08/20/17 and reported feeling "lousy". She reported marked fatigue and weaknss, and DOE. She was without chest pain, LE edema, orthopnea, or PND complaints. She had undergone a recent NST 07/2017 which was without ischemia. Her Bystolic was increased the visit prior to improve her paroxysmal SVT control. She was recommended to return to clinic in 3 months. Her loop recorder was also interrogated 07/2017 and noted 4 episodes of AF with <1% burden.   At the time of this evaluation patient is quite somnolent after seizure activity overnight and receiving IV ativan. Family reports she has lost vision in her right eye now, having lost vision in her left eye 2/2 CVA earlier this year. Prior to current stroke she was living alone.   Hospital course: BP elevated on arrival, improved to 141/63 now (permissive HTN) and tachycardic briefly around 12:40am;  otherwise VSS. Labs notable for electrolytes wnl, Cr 0.65, LFTs wnl, Hgb 14.5, PLT 197, Trop negative x1, Utox negative, Etoh negative. CTA Head/neck revealed acute L M3 occlusion (likely embolic). EEG abnormal. MRI pending. Neurology following, suspect acute embolic stroke is likely 2/2 atrial fibrillation. Cardiology asked to evaluate patient for atrial fibrillation.  Past Medical History:  Diagnosis Date  . Arthritis    "thumbs, joints" (03/23/2017  . Basal cell carcinoma    "several; scattered over my face, hands, leg some cut off; some burned off"  . Blind left eye    a. Corneal transplant x3 with rejection  . CAD (coronary artery disease)    a. s/p NSTEMI 2015 treated conservatively; nuclear stress test low risk. b. Continued angina despite med rx 07/2015 - s/p Bio Freedom Stent to ramus intermedius with mod LAD stenosis neg by FFR. // Nuc study 5/19:  EF 67, no ischemia or scar; Low Risk   . Complication of anesthesia    "very sensitive to RX"  . Diverticulitis large intestine   . Diverticulosis   . Family history of adverse reaction to anesthesia    "we all get PONV" (1/1/201)  . Gastritis   . GERD (gastroesophageal reflux disease)   . Hayfever   . Helicobacter pylori (H. pylori) 05/22/02   RUT-Positive  . Hypertension   . Myocardial infarction ([place]) 2015  . Squamous carcinoma    "several; scattered over my face, hands, leg some cut off; some burned off"  . Stroke [facility]) 2015   denies residual on 08/07/2015  .  Stroke [facility]) 03/18/2017   "has effected her vision, balance, some memory" (03/23/2017)  . SVT (supraventricular tachycardia) (HCC)    a. seen by Dr. Caryl Comes - has loop recorder in. Patient has declined amiodarone due to side effect profile  . Varicose veins     Past Surgical History:  Procedure Laterality Date  . BASAL CELL CARCINOMA EXCISION     "several; scattered over my face, hands, leg some cut off; some burned off"  . CARDIAC CATHETERIZATION N/A 08/07/2015    Procedure: Left Heart Cath and Coronary Angiography;  Surgeon: [name], MD;  Location: [place] CV LAB;  Service: Cardiovascular;  Laterality: N/A;  . CARDIAC CATHETERIZATION N/A 08/07/2015   Procedure: Coronary Stent Intervention;  Surgeon: [name], MD;  Location: [place] CV LAB;  Service: Cardiovascular;  Laterality: N/A;  . CARDIAC CATHETERIZATION N/A 08/07/2015   Procedure: Intravascular Pressure Wire/FFR Study;  Surgeon: [name], MD;  Location: [place] CV LAB;  Service: Cardiovascular;  Laterality: N/A;  . CATARACT EXTRACTION W/ INTRAOCULAR LENS  IMPLANT, BILATERAL Bilateral 2005  . CLOSED REDUCTION SHOULDER DISLOCATION Left 2016 X 2  . CORNEAL TRANSPLANT Bilateral right 2009, left 2009    3 in left eye (last 2 failed), 1 in right eye  . DILATION AND CURETTAGE OF UTERUS    . EYE SURGERY    . JOINT REPLACEMENT    . [place]   "had gangrene in it"  . LOOP RECORDER IMPLANT N/A 01/05/2014   Procedure: LOOP RECORDER IMPLANT;  Surgeon: Coralyn Mark, MD;  Location: [facility]ATH LAB;  Service: Cardiovascular;  Laterality: N/A;  . SQUAMOUS CELL CARCINOMA EXCISION     "several; scattered over my face, hands, leg some cut off; some burned off"  . TONSILLECTOMY    . TOTAL ABDOMINAL HYSTERECTOMY  11/1983   "ovaries and all"  . TOTAL KNEE ARTHROPLASTY Left 06/27/2012   Procedure: LEFT TOTAL KNEE ARTHROPLASTY;  Surgeon: Gearlean Alf, MD;  Location: [facility];  Service: Orthopedics;  Laterality: Left;  . TOTAL KNEE ARTHROPLASTY Right 12/12/2012   Procedure: RIGHT TOTAL KNEE ARTHROPLASTY;  Surgeon: Gearlean Alf, MD;  Location: [facility];  Service: Orthopedics;  Laterality: Right;  . TUBAL LIGATION  1966     Home Medications:  Prior to Admission medications   Medication Sig Start Date End Date Taking? Authorizing Provider  Magnesium 250 MG TABS Take 250 mg by mouth at bedtime.   Yes [provider]  Multiple Vitamin (MULTI VITAMIN DAILY PO)  Take 1 tablet by mouth every morning.   Yes [provider]  Multiple Vitamins-Minerals (PRESERVISION AREDS 2) CAPS Take 1 capsule by mouth 2 (two) times daily.    Yes [provider]  nebivolol (BYSTOLIC) 2.5 MG tablet Take 1 tablet (2.5 mg total) by mouth 2 (two) times daily. 08/10/17  Yes Weaver, Scott T, PA-C  nitroGLYCERIN (NITROSTAT) 0.4 MG SL tablet Place 1 tablet (0.4 mg total) under the tongue every 5 (five) minutes as needed for chest pain. 08/08/15  Yes Rosita Fire, Brittainy M, PA-C  pantoprazole (PROTONIX) 40 MG tablet Take 40 mg by mouth daily.    Yes [provider]  Polyethyl Glycol-Propyl Glycol (SYSTANE ULTRA) 0.4-0.3 % SOLN Apply 1 drop to eye See admin instructions. Twice daily as needed and at bedtime for irritation   Yes [provider]  prednisoLONE acetate (PRED FORTE) 1 % ophthalmic suspension Place 1 drop into both eyes See admin instructions. 1 drop into both eyes  at bedtime spaced 5 minutes apart from Systane gel   Yes [provider]  PRESCRIPTION MEDICATION Place 1 drop into the right eye 4 (four) times daily. [place] [id number]   Laray Anger MD  09/13/17 Serum, Autologous 50% drop   Yes [provider]  Melatonin 1 MG SUBL Place 1 mg under the tongue at bedtime.    [provider]    Inpatient Medications: Scheduled Meds: . artificial tears  1 application Both Eyes QHS  . atorvastatin  80 mg Oral q1800  . clopidogrel  75 mg Oral Daily  . enoxaparin (LOVENOX) injection  40 mg Subcutaneous Q24H  . fentaNYL (SUBLIMAZE) injection  50 mcg Intravenous Once  . magnesium oxide  200 mg Oral QHS  . multivitamin  1 tablet Oral QPC breakfast  . ondansetron (ZOFRAN) IV  4 mg Intravenous Once  . prednisoLONE acetate  1 drop Both Eyes QHS   Continuous Infusions: . famotidine (PEPCID) IV 20 mg (09/21/17 2152)  . levETIRAcetam 500 mg (09/22/17 0827)   PRN Meds: acetaminophen **OR** acetaminophen  (TYLENOL) oral liquid 160 mg/5 mL **OR** acetaminophen, fentaNYL (SUBLIMAZE) injection, ondansetron (ZOFRAN) IV, polyvinyl alcohol  Allergies:    Allergies  Allergen Reactions  . Contrast Media [Iodinated Diagnostic Agents] Anaphylaxis  . Fluorescein Anaphylaxis, Shortness Of Breath, Itching and Swelling  . Hydralazine Hcl Anaphylaxis, Swelling, Palpitations and Rash  . Sulfa Antibiotics Rash and Other (See Comments)    Other Reaction: red bumps  . Ultram [Tramadol] Hives  . Yellow Dye Anaphylaxis, Itching, Swelling and Other (See Comments)    Fluorescein (in eye drops)  . Buprenex [Buprenorphine Hcl] Palpitations and Other (See Comments)  . Keflex [Cephalexin] Diarrhea  . Lipitor [Atorvastatin] Other (See Comments)    Muscle weakness  . Restasis [Cyclosporine] Swelling    redness of face with slight swelling   . Augmentin [Amoxicillin-Pot Clavulanate] Nausea And Vomiting  . Levofloxacin Other (See Comments)    Reaction not recalled  . Morphine And Related Other (See Comments)    agitation   . Percocet [Oxycodone-Acetaminophen] Nausea And Vomiting  . Tape Other (See Comments)    Adhesive tape pulls off the skin Able to tolerate paper tape  . Zetia [Ezetimibe] Other (See Comments)    Myalgias    Social History:   Social History   Socioeconomic History  . Marital status: Widowed    Spouse name: Not on file  . Number of children: 3  . Years of education: Some college  . Highest education level: Not on file  Occupational History  . Occupation: Retired  Scientific laboratory technician  . Financial resource strain: Not on file  . Food insecurity:    Worry: Not on file    Inability: Not on file  . Transportation needs:    Medical: Not on file    Non-medical: Not on file  Tobacco Use  . Smoking status: Never Smoker  . Smokeless tobacco: Never Used  Substance and Sexual Activity  . Alcohol use: No  . Drug use: No  . Sexual activity: Never  Lifestyle  . Physical activity:    Days per  week: Not on file    Minutes per session: Not on file  . Stress: Not on file  Relationships  . Social connections:    Talks on phone: Not on file    Gets together: Not on file    Attends religious service: Not on file    Active member of club or organization: Not  on file    Attends meetings of clubs or organizations: Not on file    Relationship status: Not on file  . Intimate partner violence:    Fear of current or ex partner: Not on file    Emotionally abused: Not on file    Physically abused: Not on file    Forced sexual activity: Not on file  Other Topics Concern  . Not on file  Social History Narrative   Patient is right handed   Patient lives alone.   Patient drinks 1 cup of coffee daily.    Family History:    Family History  Problem Relation Age of Onset  . Stroke Mother   . Heart attack Father   . Heart attack Brother   . Cancer Brother   . Heart attack Brother   . Heart attack Brother   . Heart attack Brother   . Parkinsonism Brother      ROS:  Please see the history of present illness.   All other ROS reviewed and negative.     Physical Exam/Data:   Vitals:   09/22/17 0557 09/22/17 0819 09/22/17 1000 09/22/17 1131  BP: (!) 110/51 (!) 125/55 (!) 128/57 (!) 124/45  Pulse: 77 73 70 67  Resp: 16 20 20 20   Temp: 98.2 F (36.8 C) (!) 97.5 F (36.4 C) 98.3 F (36.8 C) 98.4 F (36.9 C)  TempSrc: Axillary Oral Axillary Oral  SpO2: 94% 93% 100% 99%  Weight:      Height:        Intake/Output Summary (Last 24 hours) at 09/22/2017 1345 Last data filed at 09/22/2017 1200 Gross per 24 hour  Intake 760 ml  Output -  Net 760 ml   Filed Weights   09/21/17 2055  Weight: 167 lb 5.3 oz (75.9 kg)   Body mass index is 29.64 kg/m.  General:  Well nourished, well developed elderly female laying in bed in no acute distress. Sleeping and not interactive. Moans to sternal rub.  HEENT: atraumatic, unable to open her eyes Neck: no JVD Vascular: No carotid bruits;  distal pulses 2+ bilaterally Cardiac:  normal S1, S2; RRR; no murmurs, gallops, or rubs Lungs:  clear to auscultation bilaterally, no wheezing, rhonchi or rales anteriorly Abd: NABS, soft, nontender, no hepatomegaly Ext: no edema Musculoskeletal:  No deformities, BUE and BLE strength normal and equal Skin: warm and dry  Neuro:  CNs 2-12 intact, no focal abnormalities noted Psych:  Normal affect   EKG:  The EKG was personally reviewed and demonstrates:  Atrial fibrillation with RVR, rate 139 Telemetry:  Telemetry was personally reviewed and demonstrates:  Primarily NSR with episodes of SVT/atrial fibrillation with RVR  Relevant CV Studies:  Nuclear stress test 07/2017:  Nuclear stress EF: 67%.  There was no ST segment deviation noted during stress.  The study is normal.  This is a low risk study.  The left ventricular ejection fraction is hyperdynamic (>65%).   Normal resting and stress perfusion. No ischemia or infarction EF 67%   Loop recorder device interrogation 07/2017: Loop check in clinic. Battery status: EOS since 04/11/2017. Rwaves 0.34mV. 0symptom episodes, 92 tachy episodes - hx of SVT, 0pause episodes, 0 brady episodes. 4AF episodes (<1% burden) - available EGMs show PACs.  Laboratory Data:  Chemistry Recent Labs  Lab 09/21/17 1425 09/21/17 1440  NA 142 139  K 4.2 4.1  CL 105 102  CO2 26  --   GLUCOSE 125* 121*  BUN 11 13  CREATININE  0.65 0.60  CALCIUM 9.0  --   GFRNONAA >60  --   GFRAA >60  --   ANIONGAP 11  --     Recent Labs  Lab 09/21/17 1425  PROT 6.5  ALBUMIN 3.9  AST 31  ALT 25  ALKPHOS 94  BILITOT 0.7   Hematology Recent Labs  Lab 09/21/17 1425 09/21/17 1440  WBC 5.3  --   RBC 4.78  --   HGB 14.5 15.0  HCT 46.0 44.0  MCV 96.2  --   MCH 30.3  --   MCHC 31.5  --   RDW 13.0  --   PLT 197  --    Cardiac EnzymesNo results for input(s): TROPONINI in the last 168 hours.  Recent Labs  Lab 09/21/17 1438  TROPIPOC 0.00      BNPNo results for input(s): BNP, PROBNP in the last 168 hours.  DDimer No results for input(s): DDIMER in the last 168 hours.  Radiology/Studies:  Ct Angio Head W Or Wo Contrast  Result Date: 09/21/2017 CLINICAL DATA:  Initial evaluation for acute aphasia, stroke. EXAM: CT ANGIOGRAPHY HEAD AND NECK CT PERFUSION BRAIN TECHNIQUE: Multidetector CT imaging of the head and neck was performed using the standard protocol during bolus administration of intravenous contrast. Multiplanar CT image reconstructions and MIPs were obtained to evaluate the vascular anatomy. Carotid stenosis measurements (when applicable) are obtained utilizing NASCET criteria, using the distal internal carotid diameter as the denominator. Multiphase CT imaging of the brain was performed following IV bolus contrast injection. Subsequent parametric perfusion maps were calculated using RAPID software. CONTRAST:  103mL ISOVUE-370 IOPAMIDOL (ISOVUE-370) INJECTION 76% COMPARISON:  Prior CT from earlier the same day. FINDINGS: CTA NECK FINDINGS Aortic arch: Visualized aortic arch ectatic with normal 3 vessel morphology. Mild atheromatous plaque within the aortic arch. No hemodynamically significant stenosis about the origin of the great vessels. Visualized subclavian arteries tortuous proximally but widely patent without stenosis. Right carotid system: Right common carotid artery tortuous proximally but widely patent to the bifurcation. A centric plaque about the bifurcation without hemodynamically significant stenosis. Right ICA mildly tortuous but widely patent to the skull base without stenosis, dissection, or occlusion. Left carotid system: Left common carotid artery tortuous proximally but widely patent to the bifurcation without stenosis. No significant atheromatous narrowing about the left bifurcation. Left ICA tortuous and partially medialized into the retropharyngeal space. Left ICA patent to the skull base without stenosis, dissection, or  occlusion. Vertebral arteries: Both of the vertebral arteries arise from the subclavian arteries. Left vertebral artery dominant. Vertebral arteries tortuous proximally but widely patent to the skull base without stenosis, dissection, or occlusion. Skeleton: No acute osseous abnormality. No worrisome lytic or blastic osseous lesions. Other neck: No acute soft tissue abnormality within the neck. Salivary glands normal. Thyroid normal. No adenopathy. Upper chest: Shotty subcentimeter lymph nodes noted within the visualized upper mediastinum. Scattered atelectatic changes present within the partially visualized lungs. Review of the MIP images confirms the above findings CTA HEAD FINDINGS Anterior circulation: Petrous segments widely patent bilaterally. Mild scattered atheromatous plaque within the cavernous/supraclinoid ICAs without stenosis. ICA termini widely patent. A1 segments patent bilaterally. Left A1 hypoplastic. Normal anterior communicating artery. Anterior cerebral arteries patent to their distal aspects without flow-limiting stenosis. Left M1 patent without stenosis. Normal left MCA bifurcation. There is a proximal left M3 occlusion rising off the inferior division (series 8, image 96). Adjacent inferior M3 branch remains patent. Superior left MCA division remains patent although distally appears attenuated in  somewhat irregular. Right M1 mildly irregular and diffusely narrowed without high-grade or flow-limiting stenosis. Normal right MCA bifurcation. No proximal right M2 occlusion. Distal small vessel atheromatous irregularity present throughout the right MCA branches. Posterior circulation: V4 segments patent to the vertebrobasilar junction without flow-limiting stenosis. Left vertebral artery dominant. Posterior inferior cerebral arteries patent bilaterally. Basilar widely patent to its distal aspect without stenosis. Superior cerebral arteries patent bilaterally. Both of the posterior cerebral arteries  supplied via the basilar. Scattered atheromatous irregularity throughout the mid and distal PCAs without high-grade correctable stenosis. Venous sinuses: Not well assessed due to arterial timing of the contrast bolus. Anatomic variants: None significant.  No aneurysm. Delayed phase: Not performed. Review of the MIP images confirms the above findings CT Brain Perfusion Findings: CBF (<30%) Volume: 65mL Perfusion (Tmax>6.0s) volume: 63mL Mismatch Volume: 81mL Infarction Location:Ischemic core infarct involves the left temporoccipital region. Fairly small amount of surrounding ischemic penumbra. IMPRESSION: 1. Acute left M3 occlusion, likely embolic. Associated core infarct involving the left temporoccipital region with relatively small volume 11 mL surrounding ischemic penumbra. 2. Atheromatous change involving the intracranial circulation, most notable at the right M1 segment and bilateral PCAs. No hemodynamically significant or correctable stenosis identified. 3. Mild for age atherosclerotic change about the carotid bifurcations without high-grade stenosis. 4. Diffuse tortuosity of the major arterial vasculature of the neck, suggesting chronic underlying hypertension. Critical Value/emergent results were called by telephone at the time of interpretation on 09/21/2017 at 5:59 pm to Dr. Cheral Marker, who verbally acknowledged these results. Electronically Signed   By: Jeannine Boga M.D.   On: 09/21/2017 18:30   Ct Head Wo Contrast  Result Date: 09/21/2017 CLINICAL DATA:  Altered mental status. EXAM: CT HEAD WITHOUT CONTRAST TECHNIQUE: Contiguous axial images were obtained from the base of the skull through the vertex without intravenous contrast. COMPARISON:  CT head and MRI brain 03/18/2017. FINDINGS: Brain: There is a new large area of encephalomalacia within the right occipital lobe consistent with an interval stroke (axial images 13 through 17/2). No evidence of acute intracranial hemorrhage, mass lesion, brain  edema or extra-axial fluid collection. Patchy low-density in the periventricular white matter appears stable. The ventricles and subarachnoid spaces are appropriately sized for age. There is no CT evidence of acute cortical infarction. Vascular: Intracranial vascular calcifications. No hyperdense vessel identified. Skull: Negative for fracture or focal lesion. Sinuses/Orbits: The visualized paranasal sinuses and mastoid air cells are clear. Hypoplastic frontal sinuses. No orbital abnormalities are seen. Other: None. IMPRESSION: 1. No acute intracranial findings. 2. Interval development of a large area of encephalomalacia in the right occipital lobe consistent with a stroke sustained over the last 6 months. This has no definite acute components but could be further evaluated with MRI if clinically warranted. Electronically Signed   By: Richardean Sale M.D.   On: 09/21/2017 14:58   Ct Angio Neck W Or Wo Contrast  Result Date: 09/21/2017 CLINICAL DATA:  Initial evaluation for acute aphasia, stroke. EXAM: CT ANGIOGRAPHY HEAD AND NECK CT PERFUSION BRAIN TECHNIQUE: Multidetector CT imaging of the head and neck was performed using the standard protocol during bolus administration of intravenous contrast. Multiplanar CT image reconstructions and MIPs were obtained to evaluate the vascular anatomy. Carotid stenosis measurements (when applicable) are obtained utilizing NASCET criteria, using the distal internal carotid diameter as the denominator. Multiphase CT imaging of the brain was performed following IV bolus contrast injection. Subsequent parametric perfusion maps were calculated using RAPID software. CONTRAST:  84mL ISOVUE-370 IOPAMIDOL (ISOVUE-370) INJECTION 76% COMPARISON:  Prior CT from earlier the same day. FINDINGS: CTA NECK FINDINGS Aortic arch: Visualized aortic arch ectatic with normal 3 vessel morphology. Mild atheromatous plaque within the aortic arch. No hemodynamically significant stenosis about the  origin of the great vessels. Visualized subclavian arteries tortuous proximally but widely patent without stenosis. Right carotid system: Right common carotid artery tortuous proximally but widely patent to the bifurcation. A centric plaque about the bifurcation without hemodynamically significant stenosis. Right ICA mildly tortuous but widely patent to the skull base without stenosis, dissection, or occlusion. Left carotid system: Left common carotid artery tortuous proximally but widely patent to the bifurcation without stenosis. No significant atheromatous narrowing about the left bifurcation. Left ICA tortuous and partially medialized into the retropharyngeal space. Left ICA patent to the skull base without stenosis, dissection, or occlusion. Vertebral arteries: Both of the vertebral arteries arise from the subclavian arteries. Left vertebral artery dominant. Vertebral arteries tortuous proximally but widely patent to the skull base without stenosis, dissection, or occlusion. Skeleton: No acute osseous abnormality. No worrisome lytic or blastic osseous lesions. Other neck: No acute soft tissue abnormality within the neck. Salivary glands normal. Thyroid normal. No adenopathy. Upper chest: Shotty subcentimeter lymph nodes noted within the visualized upper mediastinum. Scattered atelectatic changes present within the partially visualized lungs. Review of the MIP images confirms the above findings CTA HEAD FINDINGS Anterior circulation: Petrous segments widely patent bilaterally. Mild scattered atheromatous plaque within the cavernous/supraclinoid ICAs without stenosis. ICA termini widely patent. A1 segments patent bilaterally. Left A1 hypoplastic. Normal anterior communicating artery. Anterior cerebral arteries patent to their distal aspects without flow-limiting stenosis. Left M1 patent without stenosis. Normal left MCA bifurcation. There is a proximal left M3 occlusion rising off the inferior division (series 8,  image 96). Adjacent inferior M3 branch remains patent. Superior left MCA division remains patent although distally appears attenuated in somewhat irregular. Right M1 mildly irregular and diffusely narrowed without high-grade or flow-limiting stenosis. Normal right MCA bifurcation. No proximal right M2 occlusion. Distal small vessel atheromatous irregularity present throughout the right MCA branches. Posterior circulation: V4 segments patent to the vertebrobasilar junction without flow-limiting stenosis. Left vertebral artery dominant. Posterior inferior cerebral arteries patent bilaterally. Basilar widely patent to its distal aspect without stenosis. Superior cerebral arteries patent bilaterally. Both of the posterior cerebral arteries supplied via the basilar. Scattered atheromatous irregularity throughout the mid and distal PCAs without high-grade correctable stenosis. Venous sinuses: Not well assessed due to arterial timing of the contrast bolus. Anatomic variants: None significant.  No aneurysm. Delayed phase: Not performed. Review of the MIP images confirms the above findings CT Brain Perfusion Findings: CBF (<30%) Volume: 102mL Perfusion (Tmax>6.0s) volume: 69mL Mismatch Volume: 31mL Infarction Location:Ischemic core infarct involves the left temporoccipital region. Fairly small amount of surrounding ischemic penumbra. IMPRESSION: 1. Acute left M3 occlusion, likely embolic. Associated core infarct involving the left temporoccipital region with relatively small volume 11 mL surrounding ischemic penumbra. 2. Atheromatous change involving the intracranial circulation, most notable at the right M1 segment and bilateral PCAs. No hemodynamically significant or correctable stenosis identified. 3. Mild for age atherosclerotic change about the carotid bifurcations without high-grade stenosis. 4. Diffuse tortuosity of the major arterial vasculature of the neck, suggesting chronic underlying hypertension. Critical  Value/emergent results were called by telephone at the time of interpretation on 09/21/2017 at 5:59 pm to Dr. Cheral Marker, who verbally acknowledged these results. Electronically Signed   By: Jeannine Boga M.D.   On: 09/21/2017 18:30   Ct Cerebral Perfusion W Contrast  Result Date: 09/21/2017 CLINICAL DATA:  Initial evaluation for acute aphasia, stroke. EXAM: CT ANGIOGRAPHY HEAD AND NECK CT PERFUSION BRAIN TECHNIQUE: Multidetector CT imaging of the head and neck was performed using the standard protocol during bolus administration of intravenous contrast. Multiplanar CT image reconstructions and MIPs were obtained to evaluate the vascular anatomy. Carotid stenosis measurements (when applicable) are obtained utilizing NASCET criteria, using the distal internal carotid diameter as the denominator. Multiphase CT imaging of the brain was performed following IV bolus contrast injection. Subsequent parametric perfusion maps were calculated using RAPID software. CONTRAST:  41mL ISOVUE-370 IOPAMIDOL (ISOVUE-370) INJECTION 76% COMPARISON:  Prior CT from earlier the same day. FINDINGS: CTA NECK FINDINGS Aortic arch: Visualized aortic arch ectatic with normal 3 vessel morphology. Mild atheromatous plaque within the aortic arch. No hemodynamically significant stenosis about the origin of the great vessels. Visualized subclavian arteries tortuous proximally but widely patent without stenosis. Right carotid system: Right common carotid artery tortuous proximally but widely patent to the bifurcation. A centric plaque about the bifurcation without hemodynamically significant stenosis. Right ICA mildly tortuous but widely patent to the skull base without stenosis, dissection, or occlusion. Left carotid system: Left common carotid artery tortuous proximally but widely patent to the bifurcation without stenosis. No significant atheromatous narrowing about the left bifurcation. Left ICA tortuous and partially medialized into the  retropharyngeal space. Left ICA patent to the skull base without stenosis, dissection, or occlusion. Vertebral arteries: Both of the vertebral arteries arise from the subclavian arteries. Left vertebral artery dominant. Vertebral arteries tortuous proximally but widely patent to the skull base without stenosis, dissection, or occlusion. Skeleton: No acute osseous abnormality. No worrisome lytic or blastic osseous lesions. Other neck: No acute soft tissue abnormality within the neck. Salivary glands normal. Thyroid normal. No adenopathy. Upper chest: Shotty subcentimeter lymph nodes noted within the visualized upper mediastinum. Scattered atelectatic changes present within the partially visualized lungs. Review of the MIP images confirms the above findings CTA HEAD FINDINGS Anterior circulation: Petrous segments widely patent bilaterally. Mild scattered atheromatous plaque within the cavernous/supraclinoid ICAs without stenosis. ICA termini widely patent. A1 segments patent bilaterally. Left A1 hypoplastic. Normal anterior communicating artery. Anterior cerebral arteries patent to their distal aspects without flow-limiting stenosis. Left M1 patent without stenosis. Normal left MCA bifurcation. There is a proximal left M3 occlusion rising off the inferior division (series 8, image 96). Adjacent inferior M3 branch remains patent. Superior left MCA division remains patent although distally appears attenuated in somewhat irregular. Right M1 mildly irregular and diffusely narrowed without high-grade or flow-limiting stenosis. Normal right MCA bifurcation. No proximal right M2 occlusion. Distal small vessel atheromatous irregularity present throughout the right MCA branches. Posterior circulation: V4 segments patent to the vertebrobasilar junction without flow-limiting stenosis. Left vertebral artery dominant. Posterior inferior cerebral arteries patent bilaterally. Basilar widely patent to its distal aspect without  stenosis. Superior cerebral arteries patent bilaterally. Both of the posterior cerebral arteries supplied via the basilar. Scattered atheromatous irregularity throughout the mid and distal PCAs without high-grade correctable stenosis. Venous sinuses: Not well assessed due to arterial timing of the contrast bolus. Anatomic variants: None significant.  No aneurysm. Delayed phase: Not performed. Review of the MIP images confirms the above findings CT Brain Perfusion Findings: CBF (<30%) Volume: 56mL Perfusion (Tmax>6.0s) volume: 65mL Mismatch Volume: 88mL Infarction Location:Ischemic core infarct involves the left temporoccipital region. Fairly small amount of surrounding ischemic penumbra. IMPRESSION: 1. Acute left M3 occlusion, likely embolic. Associated core infarct involving the left temporoccipital region with relatively  small volume 11 mL surrounding ischemic penumbra. 2. Atheromatous change involving the intracranial circulation, most notable at the right M1 segment and bilateral PCAs. No hemodynamically significant or correctable stenosis identified. 3. Mild for age atherosclerotic change about the carotid bifurcations without high-grade stenosis. 4. Diffuse tortuosity of the major arterial vasculature of the neck, suggesting chronic underlying hypertension. Critical Value/emergent results were called by telephone at the time of interpretation on 09/21/2017 at 5:59 pm to Dr. Cheral Marker, who verbally acknowledged these results. Electronically Signed   By: Jeannine Boga M.D.   On: 09/21/2017 18:30    Assessment and Plan:   1. Paroxysmal atrial fibrillation: patient was admitted with acute embolic stroke. Noted to have a brief episode of atrial fibrillation with RVR during seizure activity overnight and spontaneously converted to NSR. Has had evidence of afib on prior loop recorder interrogations, most recently 07/2017 with 4 episodes of AF (<1% burden) which were thought to be 2/2 PACs on rhythm strip  review. Attempts to interrogate loop recorder today were unsuccessful as device has likely reached end of life.  - This patients CHA2DS2-VASc Score and unadjusted Ischemic Stroke Rate (% per year) is equal to 11.2 % stroke rate/year from a score of 7 Above score calculated as 1 point each if present [CHF, HTN, DM, Vascular=MI/PAD/Aortic Plaque, Age if 65-74, or Female] Above score calculated as 2 points each if present [Age > 75, or Stroke/TIA/TE] - She would ultimately benefit from being on an anticoagulant (Eliquis 5mg  BID) given increased stroke risk. Recommend starting once cleared by Neurology (currently on hold given size of stroke). Can likely stop ASA with initiation of eliquis. - Will restart bystolic 2.5mg  daily to minimize tachyarrhythmia burdern - Echo pending  2. Acute embolic stroke: thought to be 2/2 atrial fibrillation. MRI pending. Neurology following - she was started on plavix, ASA, and statin. Not started on anticoagulation due to extent of stroke - Continue management per primary team/ neurology  3. CAD s/p NSTEMI 2015: medically managed at that time. Had a recent NST 07/2017 which was without ischemia. Now on plavix and statin given recurrent stroke - Continue ASA, plavix, and statin  4. HTN: BP elevated on presentation but improved today. Allowing permissive HTN given recent stroke.  - Continue to monitor closely  5. SVT: Has had intermittent episodes on telemetry  - Will restart bystolic 2.5mg  daily to minimize tachyarrhythmia burdern  6. HLD: LDL 95 this admission. Started on atorvastatin - Continue statin   For questions or updates, please contact [place] Please consult [email or web address] for contact info under Cardiology/STEMI.   Signed, Abigail Butts, PA-C  09/22/2017 1:45 PM [phone]  I have seen and examined the patient along with Abigail Butts, PA-C .  I have reviewed the chart, notes and new data.  I agree with PA's note.  Key new  complaints: Sedated, unable to awake.  History, obtained from chart patient's son. Key examination changes: Asleep.  Normal cardiovascular exam. Key new findings / data: Electrocardiogram demonstrates atrial fibrillation rapid ventricular response.  Review of telemetry shows episodes of paroxysmal atrial fibrillation lasting several minutes, as well as multiple episodes of rapid regular atrial tachycardia  PLAN: Meets criteria for long-term chronic oral anticoagulation.  This will be started when considered safe by neurology, to avoid risk of hemorrhagic stroke transformation.  Additional considerations depend on her neurological recovery, quality of life, safety of anticoagulation considering her recent limitations, end-stage and frailty.  Sanda Klein, MD, [place] [phone] 09/22/2017,  4:16 PM

## 2017-09-23 ENCOUNTER — Inpatient Hospital Stay (HOSPITAL_COMMUNITY): Payer: PPO

## 2017-09-23 DIAGNOSIS — I351 Nonrheumatic aortic (valve) insufficiency: Secondary | ICD-10-CM

## 2017-09-23 LAB — BASIC METABOLIC PANEL
ANION GAP: 10 (ref 5–15)
BUN: 13 mg/dL (ref 8–23)
CO2: 25 mmol/L (ref 22–32)
Calcium: 8.4 mg/dL — ABNORMAL LOW (ref 8.9–10.3)
Chloride: 103 mmol/L (ref 98–111)
Creatinine, Ser: 0.72 mg/dL (ref 0.44–1.00)
GFR calc Af Amer: >60 mL/min (ref >60)
GLUCOSE: 89 mg/dL (ref 70–99)
Potassium: 3.3 mmol/L — ABNORMAL LOW (ref 3.5–5.1)
Sodium: 138 mmol/L (ref 135–145)

## 2017-09-23 LAB — ECHOCARDIOGRAM COMPLETE
Height: 63 in
Weight: 2677.27 oz

## 2017-09-23 MED ORDER — METOPROLOL TARTRATE 5 MG/5ML IV SOLN
5.0000 mg | Freq: Once | INTRAVENOUS | Status: AC
  Administered 2017-09-23: 5 mg via INTRAVENOUS
  Filled 2017-09-23: qty 5

## 2017-09-23 MED ORDER — POTASSIUM CHLORIDE 10 MEQ/100ML IV SOLN
10.0000 meq | INTRAVENOUS | Status: AC
  Administered 2017-09-23 (×2): 10 meq via INTRAVENOUS
  Filled 2017-09-23 (×2): qty 100

## 2017-09-23 MED ORDER — RISPERIDONE 0.5 MG PO TABS: 0.2500 mg | ORAL_TABLET | Freq: Every day | ORAL | Status: DC

## 2017-09-23 MED ORDER — METOPROLOL TARTRATE 5 MG/5ML IV SOLN
2.5000 mg | Freq: Three times a day (TID) | INTRAVENOUS | Status: DC
  Administered 2017-09-23 – 2017-09-24 (×4): 2.5 mg via INTRAVENOUS
  Filled 2017-09-23 (×3): qty 5

## 2017-09-23 MED ORDER — METOPROLOL TARTRATE 5 MG/5ML IV SOLN
5.0000 mg | Freq: Once | INTRAVENOUS | Status: DC
  Filled 2017-09-23: qty 5

## 2017-09-23 NOTE — Evaluation (Signed)
Physical Therapy Evaluation Patient Details Name: [name] [name] MRN: [id number] DOB: 01-12-1925 Today's Date: 09/23/2017   History of Present Illness  84 y.o. female with a PMH of CAD s/p NSTEMI 2015 (medical management), HTN, SVT, CVA with subsequent left eye blindness, who presented to the ED 09/21/17 with confusion, aphasia, and vision loss, and was found to have an acute embolic stroke  Clinical Impression  Orders received for PT evaluation. Patient demonstrates deficits in functional mobility as indicated below. Will benefit from continued skilled PT to address deficits and maximize function. Will see as indicated and progress as tolerated.  Prior to admission patient was extremely independent, likes gardening and was planning to attend a wedding for granddaughter this Saturday. Given prior level of function, good family support, and current deficits, feel patient will potentially need further rehabilitation. Recommend CIR consult at this time.    Follow Up Recommendations CIR    Equipment Recommendations  (TBD)    Recommendations for Other Services Rehab consult     Precautions / Restrictions Precautions Precautions: Fall      Mobility  Bed Mobility Overal bed mobility: Needs Assistance Bed Mobility: Rolling;Supine to Sit;Sit to Supine Rolling: Mod assist   Supine to sit: +2 for physical assistance;Max assist Sit to supine: +2 for physical assistance;Max assist   General bed mobility comments: Increased physical assist for bed mobility due to patient lethargy. Assist for rolling (hygiene and pericare performed) patient was able to flex knee with assist and performed second roll to the right side with min assist and cues. Assist to elevate trunk to upright and to return trunk  Transfers Overall transfer level: Needs assistance Equipment used: 2 person hand held assist Transfers: Sit to/from Stand Sit to Stand: Min assist;+2 physical assistance;+2 safety/equipment          General transfer comment: +2 min assist to power up to standing, modest initiation from therapist and patient able to carry out task performance.  Ambulation/Gait             General Gait Details: unable to perform despite attempts to weight shift and initiate  Stairs            Wheelchair Mobility    Modified Rankin (Stroke Patients Only) Modified Rankin (Stroke Patients Only) Pre-Morbid Rankin Score: No symptoms Modified Rankin: Severe disability     Balance Overall balance assessment: Needs assistance   Sitting balance-Leahy Scale: Fair Sitting balance - Comments: min guard for safety and stability in sitting for the majority of the session at EOB   Standing balance support: During functional activity;Bilateral upper extremity supported Standing balance-Leahy Scale: Poor Standing balance comment: reliance on UE support to maintain upright, eyes close throughout                             Pertinent Vitals/Pain Pain Assessment: Faces Pain Score: 0-No pain    Home Living Family/patient expects to be discharged to:: Unsure Living Arrangements: Alone Available Help at Discharge: Family;Available 24 hours/day Type of Home: House Home Access: Stairs to enter   [employer] of Steps: 3 in front, ramp in back Home Layout: Multi-level(split level) Home Equipment: None Additional Comments: split level home.    Prior Function           Comments: likes to work in garden     Journalist, newspaper   Dominant Hand: Right    Extremity/Trunk Assessment   Upper Extremity Assessment Upper Extremity Assessment: Difficult  to assess due to impaired cognition(active movement noted in BUEs)    Lower Extremity Assessment Lower Extremity Assessment: Difficult to assess due to impaired cognition(unable to test but noted MAE, able to maintain wt bearing)       Communication   Communication: HOH(visual deficits)  Cognition Arousal/Alertness:  Lethargic Behavior During Therapy: Flat affect Overall Cognitive Status: Difficult to assess                                        General Comments      Exercises     Assessment/Plan    PT Assessment Patient needs continued PT services  PT Problem List Decreased strength;Decreased activity tolerance;Decreased balance;Decreased mobility;Decreased cognition;Decreased safety awareness       PT Treatment Interventions DME instruction;Gait training;Stair training;Functional mobility training;Therapeutic activities;Therapeutic exercise;Balance training;Neuromuscular re-education;Patient/family education    PT Goals (Current goals can be found in the Care Plan section)  Acute Rehab PT Goals Patient Stated Goal: none stated PT Goal Formulation: With patient/family Time For Goal Achievement: 10/07/17 Potential to Achieve Goals: Good    Frequency Min 4X/week   Barriers to discharge        Co-evaluation PT/OT/SLP Co-Evaluation/Treatment: Yes Reason for Co-Treatment: For patient/therapist safety PT goals addressed during session: Mobility/safety with mobility OT goals addressed during session: ADL's and self-care       AM-PAC PT "6 Clicks" Daily Activity  Outcome Measure Difficulty turning over in bed (including adjusting bedclothes, sheets and blankets)?: Unable Difficulty moving from lying on back to sitting on the side of the bed? : Unable Difficulty sitting down on and standing up from a chair with arms (e.g., wheelchair, bedside commode, etc,.)?: Unable Help needed moving to and from a bed to chair (including a wheelchair)?: A Lot Help needed walking in hospital room?: A Lot Help needed climbing 3-5 steps with a railing? : Total 6 Click Score: 8    End of Session Equipment Utilized During Treatment: Gait belt Activity Tolerance: Patient limited by lethargy Patient left: in bed;with call bell/phone within reach;with bed alarm set;with family/visitor  present Nurse Communication: Mobility status PT Visit Diagnosis: Difficulty in walking, not elsewhere classified (R26.2);Other symptoms and signs involving the nervous system (R29.898)    Time: 4665-9935 PT Time Calculation (min) (ACUTE ONLY): 24 min   Charges:   PT Evaluation $PT Eval Moderate Complexity: 1 Mod     PT G Codes:        Alben Deeds, PT DPT  Board Certified Neurologic Specialist [place] 09/23/2017, 1:14 PM

## 2017-09-23 NOTE — Plan of Care (Signed)
Patient has minimal speech and is nonverbal majority of the time. Patient appears to be drowsy on assessment but opens her eyes intermittently. Patient's daughter [name] is at bedside. Patient maintains in the high 90s on room air. Patient has been sinus rhythm on the monitor. Will continue to monitor throughout shift.

## 2017-09-23 NOTE — Progress Notes (Signed)
  Echocardiogram 2D Echocardiogram has been performed.  Tracie Dore G Sanad Fearnow 09/23/2017, 2:46 PM

## 2017-09-23 NOTE — Progress Notes (Addendum)
STROKE TEAM PROGRESS NOTE   INTERVAL HISTORY Her son and daughter are at the bedside.  Pt had agitation overnight and was given fentanyl. During round today, pt was drowsy and sleepy, not able to wake up but able to move spontaneously in all extremities with stimulation. She was able to work with PT/OT to stand at bedside earlier.   Vitals:   09/23/17 0753 09/23/17 1130 09/23/17 1518 09/23/17 1606  BP: (!) 125/96 119/66 (!) 137/54 (!) 126/56  Pulse: (!) 139 77 64 63  Resp:  20  20  Temp: 99.1 F (37.3 C) 98.5 F (36.9 C) 98.6 F (37 C) 98.4 F (36.9 C)  TempSrc: Oral Oral Oral Oral  SpO2: 95% 97% 92% 93%  Weight:      Height:        CBC:  Recent Labs  Lab 09/21/17 1425 09/21/17 1440  WBC 5.3  --   NEUTROABS 4.2  --   HGB 14.5 15.0  HCT 46.0 44.0  MCV 96.2  --   PLT 197  --     Basic Metabolic Panel:  Recent Labs  Lab 09/21/17 1425 09/21/17 1440 09/23/17 0721  NA 142 139 138  K 4.2 4.1 3.3*  CL 105 102 103  CO2 26  --  25  GLUCOSE 125* 121* 89  BUN 11 13 13   CREATININE 0.65 0.60 0.72  CALCIUM 9.0  --  8.4*   Lipid Panel:     Component Value Date/Time   CHOL 164 09/22/2017 0441   TRIG 54 09/22/2017 0441   HDL 58 09/22/2017 0441   CHOLHDL 2.8 09/22/2017 0441   VLDL 11 09/22/2017 0441   LDLCALC 95 09/22/2017 0441   HgbA1c:  Lab Results  Component Value Date   HGBA1C 5.6 09/22/2017   Urine Drug Screen:     Component Value Date/Time   LABOPIA NONE DETECTED 09/21/2017 1533   COCAINSCRNUR NONE DETECTED 09/21/2017 1533   LABBENZ NONE DETECTED 09/21/2017 1533   AMPHETMU NONE DETECTED 09/21/2017 1533   THCU NONE DETECTED 09/21/2017 1533   LABBARB (A) 09/21/2017 1533    Result not available. Reagent lot number recalled by manufacturer.    Alcohol Level     Component Value Date/Time   ETH <10 09/21/2017 1425    IMAGING Ct Angio Head W Or Wo Contrast  Result Date: 09/21/2017 CLINICAL DATA:  Initial evaluation for acute aphasia, stroke. EXAM: CT  ANGIOGRAPHY HEAD AND NECK CT PERFUSION BRAIN TECHNIQUE: Multidetector CT imaging of the head and neck was performed using the standard protocol during bolus administration of intravenous contrast. Multiplanar CT image reconstructions and MIPs were obtained to evaluate the vascular anatomy. Carotid stenosis measurements (when applicable) are obtained utilizing NASCET criteria, using the distal internal carotid diameter as the denominator. Multiphase CT imaging of the brain was performed following IV bolus contrast injection. Subsequent parametric perfusion maps were calculated using RAPID software. CONTRAST:  72m ISOVUE-370 IOPAMIDOL (ISOVUE-370) INJECTION 76% COMPARISON:  Prior CT from earlier the same day. FINDINGS: CTA NECK FINDINGS Aortic arch: Visualized aortic arch ectatic with normal 3 vessel morphology. Mild atheromatous plaque within the aortic arch. No hemodynamically significant stenosis about the origin of the great vessels. Visualized subclavian arteries tortuous proximally but widely patent without stenosis. Right carotid system: Right common carotid artery tortuous proximally but widely patent to the bifurcation. A centric plaque about the bifurcation without hemodynamically significant stenosis. Right ICA mildly tortuous but widely patent to the skull base without stenosis, dissection, or occlusion. Left carotid  system: Left common carotid artery tortuous proximally but widely patent to the bifurcation without stenosis. No significant atheromatous narrowing about the left bifurcation. Left ICA tortuous and partially medialized into the retropharyngeal space. Left ICA patent to the skull base without stenosis, dissection, or occlusion. Vertebral arteries: Both of the vertebral arteries arise from the subclavian arteries. Left vertebral artery dominant. Vertebral arteries tortuous proximally but widely patent to the skull base without stenosis, dissection, or occlusion. Skeleton: No acute osseous  abnormality. No worrisome lytic or blastic osseous lesions. Other neck: No acute soft tissue abnormality within the neck. Salivary glands normal. Thyroid normal. No adenopathy. Upper chest: Shotty subcentimeter lymph nodes noted within the visualized upper mediastinum. Scattered atelectatic changes present within the partially visualized lungs. Review of the MIP images confirms the above findings CTA HEAD FINDINGS Anterior circulation: Petrous segments widely patent bilaterally. Mild scattered atheromatous plaque within the cavernous/supraclinoid ICAs without stenosis. ICA termini widely patent. A1 segments patent bilaterally. Left A1 hypoplastic. Normal anterior communicating artery. Anterior cerebral arteries patent to their distal aspects without flow-limiting stenosis. Left M1 patent without stenosis. Normal left MCA bifurcation. There is a proximal left M3 occlusion rising off the inferior division (series 8, image 96). Adjacent inferior M3 branch remains patent. Superior left MCA division remains patent although distally appears attenuated in somewhat irregular. Right M1 mildly irregular and diffusely narrowed without high-grade or flow-limiting stenosis. Normal right MCA bifurcation. No proximal right M2 occlusion. Distal small vessel atheromatous irregularity present throughout the right MCA branches. Posterior circulation: V4 segments patent to the vertebrobasilar junction without flow-limiting stenosis. Left vertebral artery dominant. Posterior inferior cerebral arteries patent bilaterally. Basilar widely patent to its distal aspect without stenosis. Superior cerebral arteries patent bilaterally. Both of the posterior cerebral arteries supplied via the basilar. Scattered atheromatous irregularity throughout the mid and distal PCAs without high-grade correctable stenosis. Venous sinuses: Not well assessed due to arterial timing of the contrast bolus. Anatomic variants: None significant.  No aneurysm.  Delayed phase: Not performed. Review of the MIP images confirms the above findings CT Brain Perfusion Findings: CBF (<30%) Volume: 34m Perfusion (Tmax>6.0s) volume: 421mMismatch Volume: 1163mnfarction Location:Ischemic core infarct involves the left temporoccipital region. Fairly small amount of surrounding ischemic penumbra. IMPRESSION: 1. Acute left M3 occlusion, likely embolic. Associated core infarct involving the left temporoccipital region with relatively small volume 11 mL surrounding ischemic penumbra. 2. Atheromatous change involving the intracranial circulation, most notable at the right M1 segment and bilateral PCAs. No hemodynamically significant or correctable stenosis identified. 3. Mild for age atherosclerotic change about the carotid bifurcations without high-grade stenosis. 4. Diffuse tortuosity of the major arterial vasculature of the neck, suggesting chronic underlying hypertension. Critical Value/emergent results were called by telephone at the time of interpretation on 09/21/2017 at 5:59 pm to Dr. LinCheral Markerho verbally acknowledged these results. Electronically Signed   By: BenJeannine BogaD.   On: 09/21/2017 18:30   Ct Angio Neck W Or Wo Contrast  Result Date: 09/21/2017 CLINICAL DATA:  Initial evaluation for acute aphasia, stroke. EXAM: CT ANGIOGRAPHY HEAD AND NECK CT PERFUSION BRAIN TECHNIQUE: Multidetector CT imaging of the head and neck was performed using the standard protocol during bolus administration of intravenous contrast. Multiplanar CT image reconstructions and MIPs were obtained to evaluate the vascular anatomy. Carotid stenosis measurements (when applicable) are obtained utilizing NASCET criteria, using the distal internal carotid diameter as the denominator. Multiphase CT imaging of the brain was performed following IV bolus contrast injection. Subsequent parametric perfusion maps  were calculated using RAPID software. CONTRAST:  36m ISOVUE-370 IOPAMIDOL (ISOVUE-370)  INJECTION 76% COMPARISON:  Prior CT from earlier the same day. FINDINGS: CTA NECK FINDINGS Aortic arch: Visualized aortic arch ectatic with normal 3 vessel morphology. Mild atheromatous plaque within the aortic arch. No hemodynamically significant stenosis about the origin of the great vessels. Visualized subclavian arteries tortuous proximally but widely patent without stenosis. Right carotid system: Right common carotid artery tortuous proximally but widely patent to the bifurcation. A centric plaque about the bifurcation without hemodynamically significant stenosis. Right ICA mildly tortuous but widely patent to the skull base without stenosis, dissection, or occlusion. Left carotid system: Left common carotid artery tortuous proximally but widely patent to the bifurcation without stenosis. No significant atheromatous narrowing about the left bifurcation. Left ICA tortuous and partially medialized into the retropharyngeal space. Left ICA patent to the skull base without stenosis, dissection, or occlusion. Vertebral arteries: Both of the vertebral arteries arise from the subclavian arteries. Left vertebral artery dominant. Vertebral arteries tortuous proximally but widely patent to the skull base without stenosis, dissection, or occlusion. Skeleton: No acute osseous abnormality. No worrisome lytic or blastic osseous lesions. Other neck: No acute soft tissue abnormality within the neck. Salivary glands normal. Thyroid normal. No adenopathy. Upper chest: Shotty subcentimeter lymph nodes noted within the visualized upper mediastinum. Scattered atelectatic changes present within the partially visualized lungs. Review of the MIP images confirms the above findings CTA HEAD FINDINGS Anterior circulation: Petrous segments widely patent bilaterally. Mild scattered atheromatous plaque within the cavernous/supraclinoid ICAs without stenosis. ICA termini widely patent. A1 segments patent bilaterally. Left A1 hypoplastic.  Normal anterior communicating artery. Anterior cerebral arteries patent to their distal aspects without flow-limiting stenosis. Left M1 patent without stenosis. Normal left MCA bifurcation. There is a proximal left M3 occlusion rising off the inferior division (series 8, image 96). Adjacent inferior M3 branch remains patent. Superior left MCA division remains patent although distally appears attenuated in somewhat irregular. Right M1 mildly irregular and diffusely narrowed without high-grade or flow-limiting stenosis. Normal right MCA bifurcation. No proximal right M2 occlusion. Distal small vessel atheromatous irregularity present throughout the right MCA branches. Posterior circulation: V4 segments patent to the vertebrobasilar junction without flow-limiting stenosis. Left vertebral artery dominant. Posterior inferior cerebral arteries patent bilaterally. Basilar widely patent to its distal aspect without stenosis. Superior cerebral arteries patent bilaterally. Both of the posterior cerebral arteries supplied via the basilar. Scattered atheromatous irregularity throughout the mid and distal PCAs without high-grade correctable stenosis. Venous sinuses: Not well assessed due to arterial timing of the contrast bolus. Anatomic variants: None significant.  No aneurysm. Delayed phase: Not performed. Review of the MIP images confirms the above findings CT Brain Perfusion Findings: CBF (<30%) Volume: 326mPerfusion (Tmax>6.0s) volume: 4863mismatch Volume: 36m21mfarction Location:Ischemic core infarct involves the left temporoccipital region. Fairly small amount of surrounding ischemic penumbra. IMPRESSION: 1. Acute left M3 occlusion, likely embolic. Associated core infarct involving the left temporoccipital region with relatively small volume 11 mL surrounding ischemic penumbra. 2. Atheromatous change involving the intracranial circulation, most notable at the right M1 segment and bilateral PCAs. No hemodynamically  significant or correctable stenosis identified. 3. Mild for age atherosclerotic change about the carotid bifurcations without high-grade stenosis. 4. Diffuse tortuosity of the major arterial vasculature of the neck, suggesting chronic underlying hypertension. Critical Value/emergent results were called by telephone at the time of interpretation on 09/21/2017 at 5:59 pm to Dr. LindCheral Markero verbally acknowledged these results. Electronically Signed   By: BenjMarland Kitchen  Jeannine Boga M.D.   On: 09/21/2017 18:30   Ct Cerebral Perfusion W Contrast  Result Date: 09/21/2017 CLINICAL DATA:  Initial evaluation for acute aphasia, stroke. EXAM: CT ANGIOGRAPHY HEAD AND NECK CT PERFUSION BRAIN TECHNIQUE: Multidetector CT imaging of the head and neck was performed using the standard protocol during bolus administration of intravenous contrast. Multiplanar CT image reconstructions and MIPs were obtained to evaluate the vascular anatomy. Carotid stenosis measurements (when applicable) are obtained utilizing NASCET criteria, using the distal internal carotid diameter as the denominator. Multiphase CT imaging of the brain was performed following IV bolus contrast injection. Subsequent parametric perfusion maps were calculated using RAPID software. CONTRAST:  50m ISOVUE-370 IOPAMIDOL (ISOVUE-370) INJECTION 76% COMPARISON:  Prior CT from earlier the same day. FINDINGS: CTA NECK FINDINGS Aortic arch: Visualized aortic arch ectatic with normal 3 vessel morphology. Mild atheromatous plaque within the aortic arch. No hemodynamically significant stenosis about the origin of the great vessels. Visualized subclavian arteries tortuous proximally but widely patent without stenosis. Right carotid system: Right common carotid artery tortuous proximally but widely patent to the bifurcation. A centric plaque about the bifurcation without hemodynamically significant stenosis. Right ICA mildly tortuous but widely patent to the skull base without stenosis,  dissection, or occlusion. Left carotid system: Left common carotid artery tortuous proximally but widely patent to the bifurcation without stenosis. No significant atheromatous narrowing about the left bifurcation. Left ICA tortuous and partially medialized into the retropharyngeal space. Left ICA patent to the skull base without stenosis, dissection, or occlusion. Vertebral arteries: Both of the vertebral arteries arise from the subclavian arteries. Left vertebral artery dominant. Vertebral arteries tortuous proximally but widely patent to the skull base without stenosis, dissection, or occlusion. Skeleton: No acute osseous abnormality. No worrisome lytic or blastic osseous lesions. Other neck: No acute soft tissue abnormality within the neck. Salivary glands normal. Thyroid normal. No adenopathy. Upper chest: Shotty subcentimeter lymph nodes noted within the visualized upper mediastinum. Scattered atelectatic changes present within the partially visualized lungs. Review of the MIP images confirms the above findings CTA HEAD FINDINGS Anterior circulation: Petrous segments widely patent bilaterally. Mild scattered atheromatous plaque within the cavernous/supraclinoid ICAs without stenosis. ICA termini widely patent. A1 segments patent bilaterally. Left A1 hypoplastic. Normal anterior communicating artery. Anterior cerebral arteries patent to their distal aspects without flow-limiting stenosis. Left M1 patent without stenosis. Normal left MCA bifurcation. There is a proximal left M3 occlusion rising off the inferior division (series 8, image 96). Adjacent inferior M3 branch remains patent. Superior left MCA division remains patent although distally appears attenuated in somewhat irregular. Right M1 mildly irregular and diffusely narrowed without high-grade or flow-limiting stenosis. Normal right MCA bifurcation. No proximal right M2 occlusion. Distal small vessel atheromatous irregularity present throughout the right  MCA branches. Posterior circulation: V4 segments patent to the vertebrobasilar junction without flow-limiting stenosis. Left vertebral artery dominant. Posterior inferior cerebral arteries patent bilaterally. Basilar widely patent to its distal aspect without stenosis. Superior cerebral arteries patent bilaterally. Both of the posterior cerebral arteries supplied via the basilar. Scattered atheromatous irregularity throughout the mid and distal PCAs without high-grade correctable stenosis. Venous sinuses: Not well assessed due to arterial timing of the contrast bolus. Anatomic variants: None significant.  No aneurysm. Delayed phase: Not performed. Review of the MIP images confirms the above findings CT Brain Perfusion Findings: CBF (<30%) Volume: 370mPerfusion (Tmax>6.0s) volume: 4874mismatch Volume: 26m64mfarction Location:Ischemic core infarct involves the left temporoccipital region. Fairly small amount of surrounding ischemic penumbra. IMPRESSION: 1. Acute  left M3 occlusion, likely embolic. Associated core infarct involving the left temporoccipital region with relatively small volume 11 mL surrounding ischemic penumbra. 2. Atheromatous change involving the intracranial circulation, most notable at the right M1 segment and bilateral PCAs. No hemodynamically significant or correctable stenosis identified. 3. Mild for age atherosclerotic change about the carotid bifurcations without high-grade stenosis. 4. Diffuse tortuosity of the major arterial vasculature of the neck, suggesting chronic underlying hypertension. Critical Value/emergent results were called by telephone at the time of interpretation on 09/21/2017 at 5:59 pm to Dr. Cheral Marker, who verbally acknowledged these results. Electronically Signed   By: Jeannine Boga M.D.   On: 09/21/2017 18:30   2D echocardiogram pending  EEG  Impression: This EEG is abnormal due to the presence of: 1. Moderate diffuse background slowing 2. Occasional focal  slowing over the left temporal region Clinical Correlation of the above findings indicates diffuse cerebral dysfunction that is non-specific in etiology and can be seen with hypoxic/ischemic injury, toxic/metabolic encephalopathies, neurodegenerative disorders, or medication effect. Focal slowing over the left temporal region indicates focal cerebral dysfunction in this region suggestive of underlying structural or physiologic abnormality. The absence of epileptiform discharges does not rule out a clinical diagnosis of epilepsy.  Clinical correlation is advised.  MRI pending - not able to perform due to noncooperation   PHYSICAL EXAM HEENT-  Normocephalic, no lesions, without obvious abnormality.  Normal external eye and conjunctiva.   Extremities- Warm, dry and intact Musculoskeletal-no joint tenderness, deformity or swelling Skin-warm and dry, no hyperpigmentation, vitiligo, or suspicious lesions Chest CTA, HRR  Neurological Examination Mental Status: Patient is stuporous. Mute. Reactive to stimulation but will not waken or maintain stimulated state. No obvious distress.  Cranial Nerves: II: blind left eye with cloudy cornea, done not blink to threat on the L or on the right.  III,IV, VI: Doll's appear intact on the right eye V,VII: mild R facial droop  Motor: Moves all extremities equally to stimulation/pain Sensory: withdraws to pain Deep Tendon Reflexes: 1+ in the upper extremities no knee jerk and no ankle jerk Plantars: Right: downgoing                                Left: downgoing Cerebellar: Not able to test Gait: Not tested   ASSESSMENT/PLAN [name] [name] is a 84 y.o. female with history of hypertension, SVT, CHF, CAD, legally blind left eye, prior strokes on Plavix with loop recorder that has shown 10 seconds of A. fib in past presenting with global aphasia and right homonymous hemianopsia.   Stroke:  left temporal infarct felt to be embolic secondary to atrial  fibrillation   Resultant - global aphasia, right eye cortical blindness (acute right hemianopia and chronic left hemianopia), left eye chronic blindness  CT head no acute findings.  Interval right occipital stroke past 6 months.  CTA head & neck with perfusion  Acute L M3 occlusion. Associated left temporal parietal infarct with surrounding penumbra.  Intracranial atherosclerosis are M1 and the PCAs.  Carotid atherosclerosis.  Tortuosity of vessels in the neck  MRI  Pending - not cooperative  2D Echo  pending   LDL 95  HgbA1c 5.6  Lovenox 40 mg subcu daily for VTE prophylaxis  Diet NPO  clopidogrel 75 mg daily prior to admission, now on aspirin suppository. Long term recommendation for anticoagulation with Eliquis  Therapy recommendations:  pending   Disposition:  pending   Seizure  New onset during the night 7/3  seizure felt to be secondary to current stroke given temporal location  treated with Ativan and Keppra   Continue keppra  EEG diffuse slow with occasional left temporal focal slowing, no seizure activity  ABG normal (lethargy not respiratory related)  Atrial fibrillation  Loop recorder has shown 10 seconds of atrial fibrillation in 01/2015.    Most recent reading in June automatic reading showed atrial fibrillation, physician determined PACs   Reported atrial fibrillation with RVR/SVT associated with seizure activity   loop recorder interrogation - unable to link to loop recorder. Anticipate it may be at end-of-life as it was placed in the Fall of 2015. Last interrogated in May 2019  Consider Eliquis in 5-7 days post stroke given moderate size   Hypertension  Variable but within limits . Permissive hypertension (OK if < 220/120) but gradually normalize in 5-7 days . Long-term BP goal normotensive  Hyperlipidemia  Home meds: No statin  LDL 95, goal < 70  Add statin once po access  Continue statin at discharge  Other Stroke Risk  Factors  Advanced age  Hx stroke/TIA  12/2013-small L frontal cortical infarct (R arm weakness).  Embolic.  Etiology unclear.  R PCA moderate stenosis.  Loop recorder placed.  01/2015-loop recorder shows 10 seconds atrial fibrillation, SVT.  Not placed on anticoagulation  02/2017-right midbrain infarct (L facial droop and left lip numbness) secondary to small vessel disease.  CTA head and neck showed a right PE ICA occlusion and left P2 stenosis.  Old left frontal and bilateral parietal infarcts.  Placed on DAPT to Plavix alone.  Since 02/2017-interval development of left homonymous hemianopia.  CT now shows old not present right PCA infarct in December MRI  Family hx stroke (mother)  Coronary artery disease - MI, stent  Hospital day # 2  Rosalin Hawking, MD PhD Stroke Neurology 09/23/2017 4:57 PM  To contact Stroke Continuity provider, please refer to [email or web address] After hours, contact General Neurology

## 2017-09-23 NOTE — Progress Notes (Signed)
SLP Cancellation Note  Patient Details Name: Haille Pardi MRN: [id number] DOB: 11-01-1924   Cancelled treatment:       Reason Eval/Treat Not Completed: Fatigue/lethargy limiting ability to participate. Returned to attempt swallow and speech/language evals.  Pt obtunded, unable to participate.  Granddaughter, who is SLP, is at bedside. Will continue efforts.    Juan Quam Laurice 09/23/2017, 1:50 PM

## 2017-09-23 NOTE — Progress Notes (Signed)
Rehab Admissions Coordinator Note:  Patient was screened by Cleatrice Burke for appropriateness for an Inpatient Acute Rehab Consult.  At this time, we are recommending Inpatient Rehab consult.  Cleatrice Burke 09/23/2017, 2:27 PM  I can be reached at [phone].

## 2017-09-23 NOTE — Progress Notes (Signed)
PROGRESS NOTE        PATIENT DETAILS Name: [name] [name] Age: 84 y.o. Sex: female Date of Birth: 1924-04-28 Admit Date: 09/21/2017 Admitting Physician Vianne Bulls, MD JOA:CZYSA, Butch Penny, MD  Brief Narrative: Patient is a 84 y.o. female with history of left eye blindness, prior history of CVA on Plavix (loop recorder implanted), SVT with confusion, visual disturbances, sensory aphasia.  CT angiogram brain and CT perfusion study confirmed a left M3 occlusion with acute infarct involving the temporal cortical area.  She was admitted to the hospitalist service, but then developed generalized tonic-clonic seizure and A. fib with RVR.  She was started on IV Keppra, she spontaneously converted back to sinus rhythm.  See below for further details  Subjective: Became confused last night-requiring IV fentanyl.  Lethargic/obtunded again this morning.  Assessment/Plan: Acute CVA: Likely embolic-telemetry evaluation/twelve-lead EKG confirms atrial fibrillation.  CT angiogram of the neck with no stenosis, awaiting echo.  LDL 95, A1c 5.6.  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  She otherwise seems to be moving all 4 extremities in response to pain.  MRI of the brain is still pending.  Await further input from a neurology but overall a very difficult situation in this 84 year old patient.  Long discussion with daughter and son at bedside-hard to prognosticate without complete neurological evaluation-but clearly if she is developed blindness and sensory aphasia-prognosis may not be that great.  Family considering DNR  status.  Acute metabolic encephalopathy: Initially felt to be from postictal state and Ativan that she received yesterday morning.  She became very delirious last night per family and was attempting to get out of bed.  She currently is obtunded due to narcotics that she received last night.  EEG on 7/3 was negative for seizures.  ABG without hypercarbia.  Continue to provide supportive care-if she is able to resume oral intake-we can order low-dose Risperdal or Seroquel at night to minimize delirium as much as possible   PAF: Continues to have runs of A. fib with RVR-start low-dose IV Lopressor for now.  No oral intake possible as she is n.p.o. and obtunded-has not yet had a swallow evaluation.  Although she clearly needs a long-term anticoagulation-if she has significant blindness, sensory aphasia-and issues with delirium-may not be a good long-term candidate.  Family aware of these issues.    History of paroxysmal SVT: Documented on loop recorder data (loop recorder implanted after CVA)-continue telemetry monitoring.  Generalized tonic-clonic seizure: Occurred on 7/3-after she was admitted-currently on Keppra.  She remains obtunded this morning after she received fentanyl.  EEG on 7/3 did not show seizure-like activity.  Neurology following.   Hypertension: Allow permissive hypertension-hold antihypertensives currently on hold.  CAD: Currently without any anginal symptoms.  Left eye blindness: She has corneal scarring-unfortunately she may have developed significant visual deficits from this acute CVA.  Advanced directive/palliative care: Full code for now-Long discussion with family at bedside this morning-they are aware that the patient may end up with some significant neurological deficits (mostly visual-possibly speech)-but very hard to  prognosticate without good neurological exam/evaluation by rehab services.  She is acutely ill and at risk of worsening during this hospital stay.  Have asked  family to see if they agree with a DNR order.  We discussed hospice in case she does not improve enough-but clearly felt that we needed to see how she does over the next few days.  We will continue to engage with family.  DVT Prophylaxis: Prophylactic Lovenox   Code Status: Full code  Family Communication: Daughter and son at bedside  Disposition Plan: Remain inpatient-await further work-up/evaluation from rehab services-see how she does before deciding on disposition.  Antimicrobial agents: Anti-infectives (From admission, onward)   None     Procedures: Bibe  CONSULTS:  neurology  Time spent: 35 minutes-Greater than 50% of this time was spent in counseling, explanation of diagnosis, planning of further management, and coordination of care.  MEDICATIONS: Scheduled Meds: . artificial tears  1 application Both Eyes QHS  . aspirin  300 mg Rectal Daily  . atorvastatin  80 mg Oral q1800  . enoxaparin (LOVENOX) injection  40 mg Subcutaneous Q24H  . fentaNYL (SUBLIMAZE) injection  50 mcg Intravenous Once  . magnesium oxide  200 mg Oral QHS  . metoprolol tartrate  2.5 mg Intravenous Q8H  . multivitamin  1 tablet Oral QPC breakfast  . nebivolol  2.5 mg Oral Daily  . ondansetron (ZOFRAN) IV  4 mg Intravenous Once  . prednisoLONE acetate  1 drop Both Eyes QHS   Continuous Infusions: . sodium chloride 40 mL/hr at 09/22/17 1612  . famotidine (PEPCID) IV 20 mg (09/22/17 2139)  . levETIRAcetam 500 mg (09/23/17 0821)  . potassium chloride 10 mEq (09/23/17 1017)   PRN Meds:.acetaminophen **OR** acetaminophen (TYLENOL) oral liquid 160 mg/5 mL **OR** acetaminophen, fentaNYL (SUBLIMAZE) injection, ondansetron (ZOFRAN) IV, polyvinyl alcohol   PHYSICAL EXAM: Vital signs: Vitals:   09/22/17 2344 09/23/17 0110 09/23/17 0329 09/23/17 0753  BP: (!) 155/60 (!) 153/60 (!) 143/60 (!) 125/96  Pulse: 68 71 74 (!) 139  Resp: 18 16 18    Temp: 98.9 F (37.2 C)  100.1 F (37.8 C) 99.1 F  (37.3 C)  TempSrc: Oral  Axillary Oral  SpO2: 100% 97% 94% 95%  Weight:      Height:       Filed Weights   09/21/17 2055  Weight: 75.9 kg (167 lb 5.3 oz)   Body mass index is 29.64 kg/m.   General appearance: Obtunded-only responds to painful stimuli by moving all 4 extremities HEENT: Atraumatic and Normocephalic Neck: supple, no JVD. Resp:Good air entry bilaterally,no rales or rhonchi CVS: S1 S2 regular, no murmurs.  GI: Bowel sounds present, Non tender and not distended with no gaurding, rigidity or rebound. Extremities: B/L Lower Ext shows no edema, both legs are warm to touch Neurology: Appears to move all 4 extremities in response to pain. Psychiatric: Normal judgment and insight. Normal mood. Musculoskeletal:No digital cyanosis Skin:No Rash, warm and dry Wounds:N/A  I have personally reviewed following labs and imaging studies  LABORATORY DATA: CBC: Recent Labs  Lab 09/21/17 1425 09/21/17 1440  WBC 5.3  --   NEUTROABS 4.2  --   HGB 14.5 15.0  HCT 46.0 44.0  MCV 96.2  --   PLT 197  --     Basic Metabolic Panel: Recent Labs  Lab 09/21/17 1425 09/21/17 1440 09/23/17 0721  NA 142 139 138  K 4.2 4.1 3.3*  CL 105 102 103  CO2 26  --  25  GLUCOSE 125* 121* 89  BUN 11 13 13   CREATININE 0.65 0.60 0.72  CALCIUM 9.0  --  8.4*    GFR: Estimated Creatinine Clearance: 45.6 mL/min (by C-G formula based on SCr of 0.72 mg/dL).  Liver Function Tests: Recent Labs  Lab 09/21/17 1425  AST 31  ALT 25  ALKPHOS 94  BILITOT 0.7  PROT 6.5  ALBUMIN 3.9   No results for input(s): LIPASE, AMYLASE in the last 168 hours. No results for input(s): AMMONIA in the last 168 hours.  Coagulation Profile: Recent Labs  Lab 09/21/17 1425  INR 0.96    Cardiac Enzymes: No results for input(s): CKTOTAL, CKMB, CKMBINDEX, TROPONINI in the last 168 hours.  BNP (last 3 results) No results for input(s): PROBNP in the last 8760 hours.  HbA1C: Recent Labs     09/22/17 0441  HGBA1C 5.6    CBG: Recent Labs  Lab 09/21/17 1435 09/22/17 0047  GLUCAP 116* 178*    Lipid Profile: Recent Labs    09/22/17 0441  CHOL 164  HDL 58  LDLCALC 95  TRIG 54  CHOLHDL 2.8    Thyroid Function Tests: No results for input(s): TSH, T4TOTAL, FREET4, T3FREE, THYROIDAB in the last 72 hours.  Anemia Panel: No results for input(s): VITAMINB12, FOLATE, FERRITIN, TIBC, IRON, RETICCTPCT in the last 72 hours.  Urine analysis:    Component Value Date/Time   COLORURINE YELLOW 09/21/2017 [place] 09/21/2017 1533   LABSPEC 1.013 09/21/2017 1533   PHURINE 7.0 09/21/2017 1533   GLUCOSEU NEGATIVE 09/21/2017 1533   HGBUR NEGATIVE 09/21/2017 1533   BILIRUBINUR NEGATIVE 09/21/2017 1533   KETONESUR 20 (A) 09/21/2017 1533   PROTEINUR NEGATIVE 09/21/2017 1533   UROBILINOGEN 0.2 12/19/2014 1830   NITRITE NEGATIVE 09/21/2017 1533   LEUKOCYTESUR NEGATIVE 09/21/2017 1533    Sepsis Labs: Lactic Acid, Venous    Component Value Date/Time   LATICACIDVEN 1.05 12/23/2012 1836    MICROBIOLOGY: No results found for this or any previous visit (from the past 240 hour(s)).  RADIOLOGY STUDIES/RESULTS: Ct Angio Head W Or Wo Contrast  Result Date: 09/21/2017 CLINICAL DATA:  Initial evaluation for acute aphasia, stroke. EXAM: CT ANGIOGRAPHY HEAD AND NECK CT PERFUSION BRAIN TECHNIQUE: Multidetector CT imaging of the head and neck was performed using the standard protocol during bolus administration of intravenous contrast. Multiplanar CT image reconstructions and MIPs were obtained to evaluate the vascular anatomy. Carotid stenosis measurements (when applicable) are obtained utilizing NASCET criteria, using the distal internal carotid diameter as the denominator. Multiphase CT imaging of the brain was performed following IV bolus contrast injection. Subsequent parametric perfusion maps were calculated using RAPID software. CONTRAST:  50mL ISOVUE-370 IOPAMIDOL  (ISOVUE-370) INJECTION 76% COMPARISON:  Prior CT from earlier the same day. FINDINGS: CTA NECK FINDINGS Aortic arch: Visualized aortic arch ectatic with normal 3 vessel morphology. Mild atheromatous plaque within the aortic arch. No hemodynamically significant stenosis about the origin of the great vessels. Visualized subclavian arteries tortuous proximally but widely patent without stenosis. Right carotid system: Right common carotid artery tortuous proximally but widely patent to the bifurcation. A centric plaque about the bifurcation without hemodynamically significant stenosis. Right ICA mildly tortuous but widely patent to the skull base without stenosis, dissection, or occlusion. Left carotid system: Left common carotid artery tortuous proximally but widely patent to the bifurcation without stenosis. No significant atheromatous narrowing about the left bifurcation. Left ICA tortuous and partially medialized into the retropharyngeal space. Left ICA patent to the skull base  without stenosis, dissection, or occlusion. Vertebral arteries: Both of the vertebral arteries arise from the subclavian arteries. Left vertebral artery dominant. Vertebral arteries tortuous proximally but widely patent to the skull base without stenosis, dissection, or occlusion. Skeleton: No acute osseous abnormality. No worrisome lytic or blastic osseous lesions. Other neck: No acute soft tissue abnormality within the neck. Salivary glands normal. Thyroid normal. No adenopathy. Upper chest: Shotty subcentimeter lymph nodes noted within the visualized upper mediastinum. Scattered atelectatic changes present within the partially visualized lungs. Review of the MIP images confirms the above findings CTA HEAD FINDINGS Anterior circulation: Petrous segments widely patent bilaterally. Mild scattered atheromatous plaque within the cavernous/supraclinoid ICAs without stenosis. ICA termini widely patent. A1 segments patent bilaterally. Left A1  hypoplastic. Normal anterior communicating artery. Anterior cerebral arteries patent to their distal aspects without flow-limiting stenosis. Left M1 patent without stenosis. Normal left MCA bifurcation. There is a proximal left M3 occlusion rising off the inferior division (series 8, image 96). Adjacent inferior M3 branch remains patent. Superior left MCA division remains patent although distally appears attenuated in somewhat irregular. Right M1 mildly irregular and diffusely narrowed without high-grade or flow-limiting stenosis. Normal right MCA bifurcation. No proximal right M2 occlusion. Distal small vessel atheromatous irregularity present throughout the right MCA branches. Posterior circulation: V4 segments patent to the vertebrobasilar junction without flow-limiting stenosis. Left vertebral artery dominant. Posterior inferior cerebral arteries patent bilaterally. Basilar widely patent to its distal aspect without stenosis. Superior cerebral arteries patent bilaterally. Both of the posterior cerebral arteries supplied via the basilar. Scattered atheromatous irregularity throughout the mid and distal PCAs without high-grade correctable stenosis. Venous sinuses: Not well assessed due to arterial timing of the contrast bolus. Anatomic variants: None significant.  No aneurysm. Delayed phase: Not performed. Review of the MIP images confirms the above findings CT Brain Perfusion Findings: CBF (<30%) Volume: 32mL Perfusion (Tmax>6.0s) volume: 76mL Mismatch Volume: 45mL Infarction Location:Ischemic core infarct involves the left temporoccipital region. Fairly small amount of surrounding ischemic penumbra. IMPRESSION: 1. Acute left M3 occlusion, likely embolic. Associated core infarct involving the left temporoccipital region with relatively small volume 11 mL surrounding ischemic penumbra. 2. Atheromatous change involving the intracranial circulation, most notable at the right M1 segment and bilateral PCAs. No  hemodynamically significant or correctable stenosis identified. 3. Mild for age atherosclerotic change about the carotid bifurcations without high-grade stenosis. 4. Diffuse tortuosity of the major arterial vasculature of the neck, suggesting chronic underlying hypertension. Critical Value/emergent results were called by telephone at the time of interpretation on 09/21/2017 at 5:59 pm to Dr. Cheral Marker, who verbally acknowledged these results. Electronically Signed   By: Jeannine Boga M.D.   On: 09/21/2017 18:30   Ct Head Wo Contrast  Result Date: 09/21/2017 CLINICAL DATA:  Altered mental status. EXAM: CT HEAD WITHOUT CONTRAST TECHNIQUE: Contiguous axial images were obtained from the base of the skull through the vertex without intravenous contrast. COMPARISON:  CT head and MRI brain 03/18/2017. FINDINGS: Brain: There is a new large area of encephalomalacia within the right occipital lobe consistent with an interval stroke (axial images 13 through 17/2). No evidence of acute intracranial hemorrhage, mass lesion, brain edema or extra-axial fluid collection. Patchy low-density in the periventricular white matter appears stable. The ventricles and subarachnoid spaces are appropriately sized for age. There is no CT evidence of acute cortical infarction. Vascular: Intracranial vascular calcifications. No hyperdense vessel identified. Skull: Negative for fracture or focal lesion. Sinuses/Orbits: The visualized paranasal sinuses and mastoid air cells are clear. Hypoplastic  frontal sinuses. No orbital abnormalities are seen. Other: None. IMPRESSION: 1. No acute intracranial findings. 2. Interval development of a large area of encephalomalacia in the right occipital lobe consistent with a stroke sustained over the last 6 months. This has no definite acute components but could be further evaluated with MRI if clinically warranted. Electronically Signed   By: Richardean Sale M.D.   On: 09/21/2017 14:58   Ct Angio Neck W  Or Wo Contrast  Result Date: 09/21/2017 CLINICAL DATA:  Initial evaluation for acute aphasia, stroke. EXAM: CT ANGIOGRAPHY HEAD AND NECK CT PERFUSION BRAIN TECHNIQUE: Multidetector CT imaging of the head and neck was performed using the standard protocol during bolus administration of intravenous contrast. Multiplanar CT image reconstructions and MIPs were obtained to evaluate the vascular anatomy. Carotid stenosis measurements (when applicable) are obtained utilizing NASCET criteria, using the distal internal carotid diameter as the denominator. Multiphase CT imaging of the brain was performed following IV bolus contrast injection. Subsequent parametric perfusion maps were calculated using RAPID software. CONTRAST:  50mL ISOVUE-370 IOPAMIDOL (ISOVUE-370) INJECTION 76% COMPARISON:  Prior CT from earlier the same day. FINDINGS: CTA NECK FINDINGS Aortic arch: Visualized aortic arch ectatic with normal 3 vessel morphology. Mild atheromatous plaque within the aortic arch. No hemodynamically significant stenosis about the origin of the great vessels. Visualized subclavian arteries tortuous proximally but widely patent without stenosis. Right carotid system: Right common carotid artery tortuous proximally but widely patent to the bifurcation. A centric plaque about the bifurcation without hemodynamically significant stenosis. Right ICA mildly tortuous but widely patent to the skull base without stenosis, dissection, or occlusion. Left carotid system: Left common carotid artery tortuous proximally but widely patent to the bifurcation without stenosis. No significant atheromatous narrowing about the left bifurcation. Left ICA tortuous and partially medialized into the retropharyngeal space. Left ICA patent to the skull base without stenosis, dissection, or occlusion. Vertebral arteries: Both of the vertebral arteries arise from the subclavian arteries. Left vertebral artery dominant. Vertebral arteries tortuous proximally  but widely patent to the skull base without stenosis, dissection, or occlusion. Skeleton: No acute osseous abnormality. No worrisome lytic or blastic osseous lesions. Other neck: No acute soft tissue abnormality within the neck. Salivary glands normal. Thyroid normal. No adenopathy. Upper chest: Shotty subcentimeter lymph nodes noted within the visualized upper mediastinum. Scattered atelectatic changes present within the partially visualized lungs. Review of the MIP images confirms the above findings CTA HEAD FINDINGS Anterior circulation: Petrous segments widely patent bilaterally. Mild scattered atheromatous plaque within the cavernous/supraclinoid ICAs without stenosis. ICA termini widely patent. A1 segments patent bilaterally. Left A1 hypoplastic. Normal anterior communicating artery. Anterior cerebral arteries patent to their distal aspects without flow-limiting stenosis. Left M1 patent without stenosis. Normal left MCA bifurcation. There is a proximal left M3 occlusion rising off the inferior division (series 8, image 96). Adjacent inferior M3 branch remains patent. Superior left MCA division remains patent although distally appears attenuated in somewhat irregular. Right M1 mildly irregular and diffusely narrowed without high-grade or flow-limiting stenosis. Normal right MCA bifurcation. No proximal right M2 occlusion. Distal small vessel atheromatous irregularity present throughout the right MCA branches. Posterior circulation: V4 segments patent to the vertebrobasilar junction without flow-limiting stenosis. Left vertebral artery dominant. Posterior inferior cerebral arteries patent bilaterally. Basilar widely patent to its distal aspect without stenosis. Superior cerebral arteries patent bilaterally. Both of the posterior cerebral arteries supplied via the basilar. Scattered atheromatous irregularity throughout the mid and distal PCAs without high-grade correctable stenosis. Venous sinuses: Not  well  assessed due to arterial timing of the contrast bolus. Anatomic variants: None significant.  No aneurysm. Delayed phase: Not performed. Review of the MIP images confirms the above findings CT Brain Perfusion Findings: CBF (<30%) Volume: 47mL Perfusion (Tmax>6.0s) volume: 35mL Mismatch Volume: 61mL Infarction Location:Ischemic core infarct involves the left temporoccipital region. Fairly small amount of surrounding ischemic penumbra. IMPRESSION: 1. Acute left M3 occlusion, likely embolic. Associated core infarct involving the left temporoccipital region with relatively small volume 11 mL surrounding ischemic penumbra. 2. Atheromatous change involving the intracranial circulation, most notable at the right M1 segment and bilateral PCAs. No hemodynamically significant or correctable stenosis identified. 3. Mild for age atherosclerotic change about the carotid bifurcations without high-grade stenosis. 4. Diffuse tortuosity of the major arterial vasculature of the neck, suggesting chronic underlying hypertension. Critical Value/emergent results were called by telephone at the time of interpretation on 09/21/2017 at 5:59 pm to Dr. Cheral Marker, who verbally acknowledged these results. Electronically Signed   By: Jeannine Boga M.D.   On: 09/21/2017 18:30   Ct Cerebral Perfusion W Contrast  Result Date: 09/21/2017 CLINICAL DATA:  Initial evaluation for acute aphasia, stroke. EXAM: CT ANGIOGRAPHY HEAD AND NECK CT PERFUSION BRAIN TECHNIQUE: Multidetector CT imaging of the head and neck was performed using the standard protocol during bolus administration of intravenous contrast. Multiplanar CT image reconstructions and MIPs were obtained to evaluate the vascular anatomy. Carotid stenosis measurements (when applicable) are obtained utilizing NASCET criteria, using the distal internal carotid diameter as the denominator. Multiphase CT imaging of the brain was performed following IV bolus contrast injection. Subsequent  parametric perfusion maps were calculated using RAPID software. CONTRAST:  61mL ISOVUE-370 IOPAMIDOL (ISOVUE-370) INJECTION 76% COMPARISON:  Prior CT from earlier the same day. FINDINGS: CTA NECK FINDINGS Aortic arch: Visualized aortic arch ectatic with normal 3 vessel morphology. Mild atheromatous plaque within the aortic arch. No hemodynamically significant stenosis about the origin of the great vessels. Visualized subclavian arteries tortuous proximally but widely patent without stenosis. Right carotid system: Right common carotid artery tortuous proximally but widely patent to the bifurcation. A centric plaque about the bifurcation without hemodynamically significant stenosis. Right ICA mildly tortuous but widely patent to the skull base without stenosis, dissection, or occlusion. Left carotid system: Left common carotid artery tortuous proximally but widely patent to the bifurcation without stenosis. No significant atheromatous narrowing about the left bifurcation. Left ICA tortuous and partially medialized into the retropharyngeal space. Left ICA patent to the skull base without stenosis, dissection, or occlusion. Vertebral arteries: Both of the vertebral arteries arise from the subclavian arteries. Left vertebral artery dominant. Vertebral arteries tortuous proximally but widely patent to the skull base without stenosis, dissection, or occlusion. Skeleton: No acute osseous abnormality. No worrisome lytic or blastic osseous lesions. Other neck: No acute soft tissue abnormality within the neck. Salivary glands normal. Thyroid normal. No adenopathy. Upper chest: Shotty subcentimeter lymph nodes noted within the visualized upper mediastinum. Scattered atelectatic changes present within the partially visualized lungs. Review of the MIP images confirms the above findings CTA HEAD FINDINGS Anterior circulation: Petrous segments widely patent bilaterally. Mild scattered atheromatous plaque within the  cavernous/supraclinoid ICAs without stenosis. ICA termini widely patent. A1 segments patent bilaterally. Left A1 hypoplastic. Normal anterior communicating artery. Anterior cerebral arteries patent to their distal aspects without flow-limiting stenosis. Left M1 patent without stenosis. Normal left MCA bifurcation. There is a proximal left M3 occlusion rising off the inferior division (series 8, image 96). Adjacent inferior M3 branch remains patent.  Superior left MCA division remains patent although distally appears attenuated in somewhat irregular. Right M1 mildly irregular and diffusely narrowed without high-grade or flow-limiting stenosis. Normal right MCA bifurcation. No proximal right M2 occlusion. Distal small vessel atheromatous irregularity present throughout the right MCA branches. Posterior circulation: V4 segments patent to the vertebrobasilar junction without flow-limiting stenosis. Left vertebral artery dominant. Posterior inferior cerebral arteries patent bilaterally. Basilar widely patent to its distal aspect without stenosis. Superior cerebral arteries patent bilaterally. Both of the posterior cerebral arteries supplied via the basilar. Scattered atheromatous irregularity throughout the mid and distal PCAs without high-grade correctable stenosis. Venous sinuses: Not well assessed due to arterial timing of the contrast bolus. Anatomic variants: None significant.  No aneurysm. Delayed phase: Not performed. Review of the MIP images confirms the above findings CT Brain Perfusion Findings: CBF (<30%) Volume: 58mL Perfusion (Tmax>6.0s) volume: 67mL Mismatch Volume: 22mL Infarction Location:Ischemic core infarct involves the left temporoccipital region. Fairly small amount of surrounding ischemic penumbra. IMPRESSION: 1. Acute left M3 occlusion, likely embolic. Associated core infarct involving the left temporoccipital region with relatively small volume 11 mL surrounding ischemic penumbra. 2. Atheromatous  change involving the intracranial circulation, most notable at the right M1 segment and bilateral PCAs. No hemodynamically significant or correctable stenosis identified. 3. Mild for age atherosclerotic change about the carotid bifurcations without high-grade stenosis. 4. Diffuse tortuosity of the major arterial vasculature of the neck, suggesting chronic underlying hypertension. Critical Value/emergent results were called by telephone at the time of interpretation on 09/21/2017 at 5:59 pm to Dr. Cheral Marker, who verbally acknowledged these results. Electronically Signed   By: Jeannine Boga M.D.   On: 09/21/2017 18:30     LOS: 2 days   Oren Binet, MD  [facility]  If 7PM-7AM, please contact night-coverage  Please page via [email or web address]-click on MD name and type text message  09/23/2017, 11:00 AM

## 2017-09-23 NOTE — Evaluation (Signed)
SLP Cancellation Note  Patient Details Name: [name] [name] MRN: [id number] DOB: November 16, 1924   Cancelled treatment:       Reason Eval/Treat Not Completed: Other (comment)(pt going for MRI at this time, will continue efforts)   Macario Golds 09/23/2017, 8:32 AM   Luanna Salk, [place] [facility] SLP [phone]

## 2017-09-23 NOTE — Progress Notes (Signed)
Patient noted more alert with eyes open for a short period of time then closed again attempting to get out of bed. Pt able to use BSC wth staffs, able to follow commands with eyes closed. Peri care and oral care provided. Pt is now back in bed resting quietly. Family at bedside.   Ave Filter, RN

## 2017-09-23 NOTE — Progress Notes (Signed)
Progress Note  Patient Name: [name] [name] Date of Encounter: 09/23/2017  Primary Cardiologist: [name], MD   Subjective   The patient has been sleeping all day. Family in the room (son and daughter in law)  Inpatient Medications    Scheduled Meds: . artificial tears  1 application Both Eyes QHS  . aspirin  300 mg Rectal Daily  . atorvastatin  80 mg Oral q1800  . enoxaparin (LOVENOX) injection  40 mg Subcutaneous Q24H  . fentaNYL (SUBLIMAZE) injection  50 mcg Intravenous Once  . magnesium oxide  200 mg Oral QHS  . metoprolol tartrate  2.5 mg Intravenous Q8H  . multivitamin  1 tablet Oral QPC breakfast  . nebivolol  2.5 mg Oral Daily  . ondansetron (ZOFRAN) IV  4 mg Intravenous Once  . prednisoLONE acetate  1 drop Both Eyes QHS   Continuous Infusions: . sodium chloride 40 mL/hr at 09/22/17 1612  . famotidine (PEPCID) IV 20 mg (09/22/17 2139)  . levETIRAcetam 500 mg (09/23/17 0821)  . potassium chloride     PRN Meds: acetaminophen **OR** acetaminophen (TYLENOL) oral liquid 160 mg/5 mL **OR** acetaminophen, fentaNYL (SUBLIMAZE) injection, ondansetron (ZOFRAN) IV, polyvinyl alcohol   Vital Signs    Vitals:   09/22/17 2344 09/23/17 0110 09/23/17 0329 09/23/17 0753  BP: (!) 155/60 (!) 153/60 (!) 143/60 (!) 125/96  Pulse: 68 71 74 (!) 139  Resp: 18 16 18    Temp: 98.9 F (37.2 C)  100.1 F (37.8 C) 99.1 F (37.3 C)  TempSrc: Oral  Axillary Oral  SpO2: 100% 97% 94% 95%  Weight:      Height:        Intake/Output Summary (Last 24 hours) at 09/23/2017 1009 Last data filed at 09/23/2017 0900 Gross per 24 hour  Intake 856.77 ml  Output -  Net 856.77 ml   Filed Weights   09/21/17 2055  Weight: 167 lb 5.3 oz (75.9 kg)    Telemetry    SR--> a-fib with RVR this am 4:30-8:30 am, now in SR with rates in 70' - Personally Reviewed  ECG    None new - Personally Reviewed  Physical Exam  sleeping GEN: No acute distress.   Neck: No JVD Cardiac: RRR, no murmurs,  rubs, or gallops.  Respiratory: Clear to auscultation bilaterally. GI: Soft, nontender, non-distended  MS: No edema; No deformity. Neuro: somnolent, doesn't awake on physical exam   Labs    Chemistry Recent Labs  Lab 09/21/17 1425 09/21/17 1440 09/23/17 0721  NA 142 139 138  K 4.2 4.1 3.3*  CL 105 102 103  CO2 26  --  25  GLUCOSE 125* 121* 89  BUN 11 13 13   CREATININE 0.65 0.60 0.72  CALCIUM 9.0  --  8.4*  PROT 6.5  --   --   ALBUMIN 3.9  --   --   AST 31  --   --   ALT 25  --   --   ALKPHOS 94  --   --   BILITOT 0.7  --   --   GFRNONAA >60  --  >60  GFRAA >60  --  >60  ANIONGAP 11  --  10     Hematology Recent Labs  Lab 09/21/17 1425 09/21/17 1440  WBC 5.3  --   RBC 4.78  --   HGB 14.5 15.0  HCT 46.0 44.0  MCV 96.2  --   MCH 30.3  --   MCHC 31.5  --  RDW 13.0  --   PLT 197  --     Cardiac EnzymesNo results for input(s): TROPONINI in the last 168 hours.  Recent Labs  Lab 09/21/17 1438  TROPIPOC 0.00     BNPNo results for input(s): BNP, PROBNP in the last 168 hours.   DDimer No results for input(s): DDIMER in the last 168 hours.   Radiology    Ct Angio Head W Or Wo Contrast  Result Date: 09/21/2017 CLINICAL DATA:  Initial evaluation for acute aphasia, stroke. EXAM: CT ANGIOGRAPHY HEAD AND NECK CT PERFUSION BRAIN TECHNIQUE: Multidetector CT imaging of the head and neck was performed using the standard protocol during bolus administration of intravenous contrast. Multiplanar CT image reconstructions and MIPs were obtained to evaluate the vascular anatomy. Carotid stenosis measurements (when applicable) are obtained utilizing NASCET criteria, using the distal internal carotid diameter as the denominator. Multiphase CT imaging of the brain was performed following IV bolus contrast injection. Subsequent parametric perfusion maps were calculated using RAPID software. CONTRAST:  67mL ISOVUE-370 IOPAMIDOL (ISOVUE-370) INJECTION 76% COMPARISON:  Prior CT from  earlier the same day. FINDINGS: CTA NECK FINDINGS Aortic arch: Visualized aortic arch ectatic with normal 3 vessel morphology. Mild atheromatous plaque within the aortic arch. No hemodynamically significant stenosis about the origin of the great vessels. Visualized subclavian arteries tortuous proximally but widely patent without stenosis. Right carotid system: Right common carotid artery tortuous proximally but widely patent to the bifurcation. A centric plaque about the bifurcation without hemodynamically significant stenosis. Right ICA mildly tortuous but widely patent to the skull base without stenosis, dissection, or occlusion. Left carotid system: Left common carotid artery tortuous proximally but widely patent to the bifurcation without stenosis. No significant atheromatous narrowing about the left bifurcation. Left ICA tortuous and partially medialized into the retropharyngeal space. Left ICA patent to the skull base without stenosis, dissection, or occlusion. Vertebral arteries: Both of the vertebral arteries arise from the subclavian arteries. Left vertebral artery dominant. Vertebral arteries tortuous proximally but widely patent to the skull base without stenosis, dissection, or occlusion. Skeleton: No acute osseous abnormality. No worrisome lytic or blastic osseous lesions. Other neck: No acute soft tissue abnormality within the neck. Salivary glands normal. Thyroid normal. No adenopathy. Upper chest: Shotty subcentimeter lymph nodes noted within the visualized upper mediastinum. Scattered atelectatic changes present within the partially visualized lungs. Review of the MIP images confirms the above findings CTA HEAD FINDINGS Anterior circulation: Petrous segments widely patent bilaterally. Mild scattered atheromatous plaque within the cavernous/supraclinoid ICAs without stenosis. ICA termini widely patent. A1 segments patent bilaterally. Left A1 hypoplastic. Normal anterior communicating artery.  Anterior cerebral arteries patent to their distal aspects without flow-limiting stenosis. Left M1 patent without stenosis. Normal left MCA bifurcation. There is a proximal left M3 occlusion rising off the inferior division (series 8, image 96). Adjacent inferior M3 branch remains patent. Superior left MCA division remains patent although distally appears attenuated in somewhat irregular. Right M1 mildly irregular and diffusely narrowed without high-grade or flow-limiting stenosis. Normal right MCA bifurcation. No proximal right M2 occlusion. Distal small vessel atheromatous irregularity present throughout the right MCA branches. Posterior circulation: V4 segments patent to the vertebrobasilar junction without flow-limiting stenosis. Left vertebral artery dominant. Posterior inferior cerebral arteries patent bilaterally. Basilar widely patent to its distal aspect without stenosis. Superior cerebral arteries patent bilaterally. Both of the posterior cerebral arteries supplied via the basilar. Scattered atheromatous irregularity throughout the mid and distal PCAs without high-grade correctable stenosis. Venous sinuses: Not well assessed  due to arterial timing of the contrast bolus. Anatomic variants: None significant.  No aneurysm. Delayed phase: Not performed. Review of the MIP images confirms the above findings CT Brain Perfusion Findings: CBF (<30%) Volume: 75mL Perfusion (Tmax>6.0s) volume: 28mL Mismatch Volume: 9mL Infarction Location:Ischemic core infarct involves the left temporoccipital region. Fairly small amount of surrounding ischemic penumbra. IMPRESSION: 1. Acute left M3 occlusion, likely embolic. Associated core infarct involving the left temporoccipital region with relatively small volume 11 mL surrounding ischemic penumbra. 2. Atheromatous change involving the intracranial circulation, most notable at the right M1 segment and bilateral PCAs. No hemodynamically significant or correctable stenosis  identified. 3. Mild for age atherosclerotic change about the carotid bifurcations without high-grade stenosis. 4. Diffuse tortuosity of the major arterial vasculature of the neck, suggesting chronic underlying hypertension. Critical Value/emergent results were called by telephone at the time of interpretation on 09/21/2017 at 5:59 pm to Dr. Cheral Marker, who verbally acknowledged these results. Electronically Signed   By: Jeannine Boga M.D.   On: 09/21/2017 18:30   Ct Head Wo Contrast  Result Date: 09/21/2017 CLINICAL DATA:  Altered mental status. EXAM: CT HEAD WITHOUT CONTRAST TECHNIQUE: Contiguous axial images were obtained from the base of the skull through the vertex without intravenous contrast. COMPARISON:  CT head and MRI brain 03/18/2017. FINDINGS: Brain: There is a new large area of encephalomalacia within the right occipital lobe consistent with an interval stroke (axial images 13 through 17/2). No evidence of acute intracranial hemorrhage, mass lesion, brain edema or extra-axial fluid collection. Patchy low-density in the periventricular white matter appears stable. The ventricles and subarachnoid spaces are appropriately sized for age. There is no CT evidence of acute cortical infarction. Vascular: Intracranial vascular calcifications. No hyperdense vessel identified. Skull: Negative for fracture or focal lesion. Sinuses/Orbits: The visualized paranasal sinuses and mastoid air cells are clear. Hypoplastic frontal sinuses. No orbital abnormalities are seen. Other: None. IMPRESSION: 1. No acute intracranial findings. 2. Interval development of a large area of encephalomalacia in the right occipital lobe consistent with a stroke sustained over the last 6 months. This has no definite acute components but could be further evaluated with MRI if clinically warranted. Electronically Signed   By: Richardean Sale M.D.   On: 09/21/2017 14:58   Ct Angio Neck W Or Wo Contrast  Result Date: 09/21/2017 CLINICAL  DATA:  Initial evaluation for acute aphasia, stroke. EXAM: CT ANGIOGRAPHY HEAD AND NECK CT PERFUSION BRAIN TECHNIQUE: Multidetector CT imaging of the head and neck was performed using the standard protocol during bolus administration of intravenous contrast. Multiplanar CT image reconstructions and MIPs were obtained to evaluate the vascular anatomy. Carotid stenosis measurements (when applicable) are obtained utilizing NASCET criteria, using the distal internal carotid diameter as the denominator. Multiphase CT imaging of the brain was performed following IV bolus contrast injection. Subsequent parametric perfusion maps were calculated using RAPID software. CONTRAST:  27mL ISOVUE-370 IOPAMIDOL (ISOVUE-370) INJECTION 76% COMPARISON:  Prior CT from earlier the same day. FINDINGS: CTA NECK FINDINGS Aortic arch: Visualized aortic arch ectatic with normal 3 vessel morphology. Mild atheromatous plaque within the aortic arch. No hemodynamically significant stenosis about the origin of the great vessels. Visualized subclavian arteries tortuous proximally but widely patent without stenosis. Right carotid system: Right common carotid artery tortuous proximally but widely patent to the bifurcation. A centric plaque about the bifurcation without hemodynamically significant stenosis. Right ICA mildly tortuous but widely patent to the skull base without stenosis, dissection, or occlusion. Left carotid system: Left common carotid  artery tortuous proximally but widely patent to the bifurcation without stenosis. No significant atheromatous narrowing about the left bifurcation. Left ICA tortuous and partially medialized into the retropharyngeal space. Left ICA patent to the skull base without stenosis, dissection, or occlusion. Vertebral arteries: Both of the vertebral arteries arise from the subclavian arteries. Left vertebral artery dominant. Vertebral arteries tortuous proximally but widely patent to the skull base without  stenosis, dissection, or occlusion. Skeleton: No acute osseous abnormality. No worrisome lytic or blastic osseous lesions. Other neck: No acute soft tissue abnormality within the neck. Salivary glands normal. Thyroid normal. No adenopathy. Upper chest: Shotty subcentimeter lymph nodes noted within the visualized upper mediastinum. Scattered atelectatic changes present within the partially visualized lungs. Review of the MIP images confirms the above findings CTA HEAD FINDINGS Anterior circulation: Petrous segments widely patent bilaterally. Mild scattered atheromatous plaque within the cavernous/supraclinoid ICAs without stenosis. ICA termini widely patent. A1 segments patent bilaterally. Left A1 hypoplastic. Normal anterior communicating artery. Anterior cerebral arteries patent to their distal aspects without flow-limiting stenosis. Left M1 patent without stenosis. Normal left MCA bifurcation. There is a proximal left M3 occlusion rising off the inferior division (series 8, image 96). Adjacent inferior M3 branch remains patent. Superior left MCA division remains patent although distally appears attenuated in somewhat irregular. Right M1 mildly irregular and diffusely narrowed without high-grade or flow-limiting stenosis. Normal right MCA bifurcation. No proximal right M2 occlusion. Distal small vessel atheromatous irregularity present throughout the right MCA branches. Posterior circulation: V4 segments patent to the vertebrobasilar junction without flow-limiting stenosis. Left vertebral artery dominant. Posterior inferior cerebral arteries patent bilaterally. Basilar widely patent to its distal aspect without stenosis. Superior cerebral arteries patent bilaterally. Both of the posterior cerebral arteries supplied via the basilar. Scattered atheromatous irregularity throughout the mid and distal PCAs without high-grade correctable stenosis. Venous sinuses: Not well assessed due to arterial timing of the contrast  bolus. Anatomic variants: None significant.  No aneurysm. Delayed phase: Not performed. Review of the MIP images confirms the above findings CT Brain Perfusion Findings: CBF (<30%) Volume: 4mL Perfusion (Tmax>6.0s) volume: 67mL Mismatch Volume: 37mL Infarction Location:Ischemic core infarct involves the left temporoccipital region. Fairly small amount of surrounding ischemic penumbra. IMPRESSION: 1. Acute left M3 occlusion, likely embolic. Associated core infarct involving the left temporoccipital region with relatively small volume 11 mL surrounding ischemic penumbra. 2. Atheromatous change involving the intracranial circulation, most notable at the right M1 segment and bilateral PCAs. No hemodynamically significant or correctable stenosis identified. 3. Mild for age atherosclerotic change about the carotid bifurcations without high-grade stenosis. 4. Diffuse tortuosity of the major arterial vasculature of the neck, suggesting chronic underlying hypertension. Critical Value/emergent results were called by telephone at the time of interpretation on 09/21/2017 at 5:59 pm to Dr. Cheral Marker, who verbally acknowledged these results. Electronically Signed   By: Jeannine Boga M.D.   On: 09/21/2017 18:30   Ct Cerebral Perfusion W Contrast  Result Date: 09/21/2017 CLINICAL DATA:  Initial evaluation for acute aphasia, stroke. EXAM: CT ANGIOGRAPHY HEAD AND NECK CT PERFUSION BRAIN TECHNIQUE: Multidetector CT imaging of the head and neck was performed using the standard protocol during bolus administration of intravenous contrast. Multiplanar CT image reconstructions and MIPs were obtained to evaluate the vascular anatomy. Carotid stenosis measurements (when applicable) are obtained utilizing NASCET criteria, using the distal internal carotid diameter as the denominator. Multiphase CT imaging of the brain was performed following IV bolus contrast injection. Subsequent parametric perfusion maps were calculated using RAPID  software.  CONTRAST:  14mL ISOVUE-370 IOPAMIDOL (ISOVUE-370) INJECTION 76% COMPARISON:  Prior CT from earlier the same day. FINDINGS: CTA NECK FINDINGS Aortic arch: Visualized aortic arch ectatic with normal 3 vessel morphology. Mild atheromatous plaque within the aortic arch. No hemodynamically significant stenosis about the origin of the great vessels. Visualized subclavian arteries tortuous proximally but widely patent without stenosis. Right carotid system: Right common carotid artery tortuous proximally but widely patent to the bifurcation. A centric plaque about the bifurcation without hemodynamically significant stenosis. Right ICA mildly tortuous but widely patent to the skull base without stenosis, dissection, or occlusion. Left carotid system: Left common carotid artery tortuous proximally but widely patent to the bifurcation without stenosis. No significant atheromatous narrowing about the left bifurcation. Left ICA tortuous and partially medialized into the retropharyngeal space. Left ICA patent to the skull base without stenosis, dissection, or occlusion. Vertebral arteries: Both of the vertebral arteries arise from the subclavian arteries. Left vertebral artery dominant. Vertebral arteries tortuous proximally but widely patent to the skull base without stenosis, dissection, or occlusion. Skeleton: No acute osseous abnormality. No worrisome lytic or blastic osseous lesions. Other neck: No acute soft tissue abnormality within the neck. Salivary glands normal. Thyroid normal. No adenopathy. Upper chest: Shotty subcentimeter lymph nodes noted within the visualized upper mediastinum. Scattered atelectatic changes present within the partially visualized lungs. Review of the MIP images confirms the above findings CTA HEAD FINDINGS Anterior circulation: Petrous segments widely patent bilaterally. Mild scattered atheromatous plaque within the cavernous/supraclinoid ICAs without stenosis. ICA termini widely  patent. A1 segments patent bilaterally. Left A1 hypoplastic. Normal anterior communicating artery. Anterior cerebral arteries patent to their distal aspects without flow-limiting stenosis. Left M1 patent without stenosis. Normal left MCA bifurcation. There is a proximal left M3 occlusion rising off the inferior division (series 8, image 96). Adjacent inferior M3 branch remains patent. Superior left MCA division remains patent although distally appears attenuated in somewhat irregular. Right M1 mildly irregular and diffusely narrowed without high-grade or flow-limiting stenosis. Normal right MCA bifurcation. No proximal right M2 occlusion. Distal small vessel atheromatous irregularity present throughout the right MCA branches. Posterior circulation: V4 segments patent to the vertebrobasilar junction without flow-limiting stenosis. Left vertebral artery dominant. Posterior inferior cerebral arteries patent bilaterally. Basilar widely patent to its distal aspect without stenosis. Superior cerebral arteries patent bilaterally. Both of the posterior cerebral arteries supplied via the basilar. Scattered atheromatous irregularity throughout the mid and distal PCAs without high-grade correctable stenosis. Venous sinuses: Not well assessed due to arterial timing of the contrast bolus. Anatomic variants: None significant.  No aneurysm. Delayed phase: Not performed. Review of the MIP images confirms the above findings CT Brain Perfusion Findings: CBF (<30%) Volume: 16mL Perfusion (Tmax>6.0s) volume: 1mL Mismatch Volume: 74mL Infarction Location:Ischemic core infarct involves the left temporoccipital region. Fairly small amount of surrounding ischemic penumbra. IMPRESSION: 1. Acute left M3 occlusion, likely embolic. Associated core infarct involving the left temporoccipital region with relatively small volume 11 mL surrounding ischemic penumbra. 2. Atheromatous change involving the intracranial circulation, most notable at the  right M1 segment and bilateral PCAs. No hemodynamically significant or correctable stenosis identified. 3. Mild for age atherosclerotic change about the carotid bifurcations without high-grade stenosis. 4. Diffuse tortuosity of the major arterial vasculature of the neck, suggesting chronic underlying hypertension. Critical Value/emergent results were called by telephone at the time of interpretation on 09/21/2017 at 5:59 pm to Dr. Cheral Marker, who verbally acknowledged these results. Electronically Signed   By: Jeannine Boga M.D.   On:  09/21/2017 18:30    Cardiac Studies   Echo is pending    Patient Profile     84 y.o. female with a PMH of CAD s/p NSTEMI 2015 (medical management), HTN, SVT, CVA with subsequent left eye blindness, who presented to the ED 09/21/17 with confusion, aphasia, and vision loss, and was found to have an acute embolic stroke, and is being seen today for the evaluation of atrial fibrillation at the request of Dr. Sloan Leiter.  Assessment & Plan    1. Paroxysmal atrial fibrillation: patient was admitted with acute embolic stroke. Noted to have a brief episode of atrial fibrillation with RVR during seizure activity overnight and spontaneously converted to NSR.  She had another episode of a-fib with RVR lasting 4 hours and spontaneous cardioversion this am, now in SR.  Has had evidence of afib on prior loop recorder interrogations, most recently 07/2017 with 4 episodes of AF (<1% burden) which were thought to be 2/2 PACs on rhythm strip review.  - This patients CHA2DS2-VASc Score and unadjusted Ischemic Stroke Rate (% per year) is equal to 11.2 % stroke rate/year from a score of 7 Above score calculated as 1 point each if present [CHF, HTN, DM, Vascular=MI/PAD/Aortic Plaque, Age if 65-74, or Female] Above score calculated as 2 points each if present [Age > 75, or Stroke/TIA/TE]  - She would ultimately benefit from being on an anticoagulant (Eliquis 5mg  BID) given increased stroke  risk. Prior to this stroke she lived alone and was fully independent, no prior falls. Recommend starting once cleared by Neurology (currently on hold given size of stroke). Can likely stop ASA with initiation of eliquis.  - Resterted bystolic 2.5mg  daily to minimize tachyarrhythmia burden, another episode despite that, I would suggest switching to cardizem once echo done and LVEF normal, or potentially amiodarone given her age, no room for increasing Bystolic, BP 867 and recent stroke - replace potassium  - Echo pending  2. Acute embolic stroke: thought to be 2/2 atrial fibrillation. MRI pending. Neurology following - she was started on plavix, ASA, and statin. Not started on anticoagulation due to extent of stroke - Continue management per primary team/ neurology  3. CAD s/p NSTEMI 2015: medically managed at that time. Had a recent NST 07/2017 which was without ischemia. Now on plavix and statin given recurrent stroke - Continue ASA, plavix, and statin  4. HTN: BP elevated on presentation but improved today. Allowing permissive HTN given recent stroke.  - Continue to monitor closely  5. SVT: Has had intermittent episodes on telemetry  - Will restart bystolic 2.5mg  daily to minimize tachyarrhythmia burdern  6. HLD: LDL 95 this admission. Started on atorvastatin - Continue statin  For questions or updates, please contact [place] Please consult [email or web address] for contact info under Cardiology/STEMI.      Signed, Ena Dawley, MD  09/23/2017, 10:09 AM

## 2017-09-23 NOTE — Evaluation (Signed)
Occupational Therapy Evaluation Patient Details Name: [name] [name] MRN: [id number] DOB: Dec 26, 1924 Today's Date: 09/23/2017    History of Present Illness 84 y.o. female with a PMH of CAD s/p NSTEMI 2015 (medical management), HTN, SVT, CVA with subsequent left eye blindness, who presented to the ED 09/21/17 with confusion, aphasia, and vision loss, and was found to have an acute embolic stroke   Clinical Impression   PTA Pt lived alone and was mod I for ADL and functional transfers. Despite visual deficits at baseline (most recent in December per Son) she was an active gardener, got out in the community with assist from family and fixed her own meals etc. Evaluation limited at this time suspect to medication. Pt currently total A for ADL but following one step commands for movement 50% of the time, min A +2 for safety for sit <>stand transfer. Pt will benefit from skilled OT in the acute setting and afterwards to maximize safety and independence in ADL/IADL and functional transfers. Given prior level of function, good family support, and current deficits, feel patient will potentially need further rehabilitation. Recommend CIR consult at this time.    Follow Up Recommendations  CIR    Equipment Recommendations  Other (comment)(defer to next venue of care)    Recommendations for Other Services       Precautions / Restrictions Precautions Precautions: Fall      Mobility Bed Mobility Overal bed mobility: Needs Assistance Bed Mobility: Rolling;Supine to Sit;Sit to Supine Rolling: Mod assist   Supine to sit: +2 for physical assistance;Max assist Sit to supine: +2 for physical assistance;Max assist   General bed mobility comments: Increased physical assist for bed mobility due to patient lethargy. Assist for rolling (hygiene and pericare performed) patient was able to flex knee with assist and performed second roll to the right side with min assist and cues. Assist to elevate trunk to  upright and to return trunk  Transfers Overall transfer level: Needs assistance Equipment used: 2 person hand held assist Transfers: Sit to/from Stand Sit to Stand: Min assist;+2 physical assistance;+2 safety/equipment         General transfer comment: +2 min assist to power up to standing, modest initiation from therapist and patient able to carry out task performance.    Balance Overall balance assessment: Needs assistance   Sitting balance-Leahy Scale: Fair Sitting balance - Comments: min guard for safety and stability in sitting for the majority of the session at EOB   Standing balance support: During functional activity;Bilateral upper extremity supported Standing balance-Leahy Scale: Poor Standing balance comment: reliance on UE support to maintain upright, eyes close throughout                           ADL either performed or assessed with clinical judgement   ADL Overall ADL's : Needs assistance/impaired     Grooming: Total assistance Grooming Details (indicate cue type and reason): hand over hand assist                               General ADL Comments: currently total A due to arousal     Vision Baseline Vision/History: Legally blind(in Left eye) Vision Assessment?: Vision impaired- to be further tested in functional context Additional Comments: Pt is blind in L eye from sx gone awry, and then had "eye stroke" and was left with L hemianopsia per son. Pt could not open  eyes throughout session despite cues and attempts of therapist to open eyes     Perception     Praxis      Pertinent Vitals/Pain Pain Assessment: Faces Pain Score: 0-No pain Faces Pain Scale: No hurt Pain Intervention(s): Monitored during session     Hand Dominance Right   Extremity/Trunk Assessment Upper Extremity Assessment Upper Extremity Assessment: Difficult to assess due to impaired cognition(active movement noted in BUEs)   Lower Extremity  Assessment Lower Extremity Assessment: Defer to PT evaluation       Communication Communication Communication: HOH(visual deficits)   Cognition Arousal/Alertness: Lethargic Behavior During Therapy: Flat affect Overall Cognitive Status: Difficult to assess                                     General Comments  Pt's son and daughter in law in room throughout session    Exercises     Shoulder Instructions      Home Living Family/patient expects to be discharged to:: Unsure Living Arrangements: Alone Available Help at Discharge: Family;Available 24 hours/day Type of Home: House Home Access: Stairs to enter [employer] of Steps: 3 in front, ramp in back   Home Layout: Multi-level(split level) Alternate Level Stairs-Number of Steps: 5-6 Alternate Level Stairs-Rails: Right Bathroom Shower/Tub: Tub/shower unit;Walk-in shower(walk in shower on bottom level, but normally takes shower on top level in tub shower)   Bathroom Toilet: Standard     Home Equipment: None   Additional Comments: split level home.      Prior Functioning/Environment Level of Independence: Independent        Comments: likes to work in garden        OT Problem List: Decreased activity tolerance;Impaired balance (sitting and/or standing);Decreased cognition      OT Treatment/Interventions: Self-care/ADL training;Neuromuscular education;DME and/or AE instruction;Therapeutic activities;Visual/perceptual remediation/compensation;Patient/family education;Balance training    OT Goals(Current goals can be found in the care plan section) Acute Rehab OT Goals Patient Stated Goal: none stated OT Goal Formulation: Patient unable to participate in goal setting Time For Goal Achievement: 10/07/17 Potential to Achieve Goals: Good ADL Goals Pt Will Perform Grooming: with modified independence;sitting Pt Will Transfer to Toilet: with min assist;ambulating Pt Will Perform Toileting -  Clothing Manipulation and hygiene: with min guard assist;sit to/from stand Additional ADL Goal #1: Pt will verbalize 3 ways of compensating for visual deficits during ADL in independent level  OT Frequency: Min 3X/week   Barriers to D/C:            Co-evaluation PT/OT/SLP Co-Evaluation/Treatment: Yes Reason for Co-Treatment: For patient/therapist safety;Necessary to address cognition/behavior during functional activity PT goals addressed during session: Mobility/safety with mobility OT goals addressed during session: ADL's and self-care      AM-PAC PT "6 Clicks" Daily Activity     Outcome Measure Help from another person eating meals?: Total Help from another person taking care of personal grooming?: Total Help from another person toileting, which includes using toliet, bedpan, or urinal?: Total Help from another person bathing (including washing, rinsing, drying)?: Total Help from another person to put on and taking off regular upper body clothing?: Total Help from another person to put on and taking off regular lower body clothing?: Total 6 Click Score: 6   End of Session Nurse Communication: Mobility status  Activity Tolerance: Patient limited by lethargy Patient left: in bed;with call bell/phone within reach;with family/visitor present;with nursing/sitter in room;Other (comment)(all 4  rails up)  OT Visit Diagnosis: Other abnormalities of gait and mobility (R26.89);Unsteadiness on feet (R26.81);Other symptoms and signs involving cognitive function;Low vision, both eyes (H54.2);Cognitive communication deficit (R41.841) Symptoms and signs involving cognitive functions: Cerebral infarction                Time: 3014-9969 OT Time Calculation (min): 25 min Charges:  OT General Charges $OT Visit: 1 Visit OT Evaluation $OT Eval Moderate Complexity: 1 Mod G-Codes:     Hulda Humphrey OTR/L [place] 09/23/2017, 1:44 PM

## 2017-09-24 DIAGNOSIS — I6310 Cerebral infarction due to embolism of unspecified precerebral artery: Secondary | ICD-10-CM

## 2017-09-24 DIAGNOSIS — I5189 Other ill-defined heart diseases: Secondary | ICD-10-CM

## 2017-09-24 DIAGNOSIS — I480 Paroxysmal atrial fibrillation: Secondary | ICD-10-CM

## 2017-09-24 DIAGNOSIS — I10 Essential (primary) hypertension: Secondary | ICD-10-CM

## 2017-09-24 DIAGNOSIS — E876 Hypokalemia: Secondary | ICD-10-CM

## 2017-09-24 DIAGNOSIS — H547 Unspecified visual loss: Secondary | ICD-10-CM

## 2017-09-24 MED ORDER — PRAVASTATIN SODIUM 20 MG PO TABS
20.0000 mg | ORAL_TABLET | Freq: Every day | ORAL | Status: DC
  Administered 2017-09-25 – 2017-09-29 (×5): 20 mg via ORAL
  Filled 2017-09-24 (×5): qty 1

## 2017-09-24 MED ORDER — METOPROLOL TARTRATE 5 MG/5ML IV SOLN
5.0000 mg | Freq: Once | INTRAVENOUS | Status: AC
  Administered 2017-09-24: 5 mg via INTRAVENOUS

## 2017-09-24 MED ORDER — SODIUM CHLORIDE 0.9 % IV SOLN: 200.0000 mg | Freq: Two times a day (BID) | INTRAVENOUS | Status: DC

## 2017-09-24 MED ORDER — NEBIVOLOL HCL 2.5 MG PO TABS
2.5000 mg | ORAL_TABLET | Freq: Two times a day (BID) | ORAL | Status: DC
  Administered 2017-09-24 – 2017-09-30 (×11): 2.5 mg via ORAL
  Filled 2017-09-24 (×13): qty 1

## 2017-09-24 MED ORDER — SODIUM CHLORIDE 0.9 % IV SOLN
100.0000 mg | Freq: Two times a day (BID) | INTRAVENOUS | Status: DC
  Administered 2017-09-24 – 2017-09-29 (×10): 100 mg via INTRAVENOUS
  Filled 2017-09-24 (×12): qty 10

## 2017-09-24 MED ORDER — NEBIVOLOL HCL 5 MG PO TABS: 5.0000 mg | ORAL_TABLET | Freq: Two times a day (BID) | ORAL | Status: DC

## 2017-09-24 MED ORDER — NEBIVOLOL HCL 2.5 MG PO TABS: 2.5000 mg | ORAL_TABLET | Freq: Two times a day (BID) | ORAL | Status: DC

## 2017-09-24 MED ORDER — DILTIAZEM LOAD VIA INFUSION
20.0000 mg | Freq: Once | INTRAVENOUS | Status: AC
  Administered 2017-09-24: 20 mg via INTRAVENOUS
  Filled 2017-09-24: qty 20

## 2017-09-24 MED ORDER — METOPROLOL TARTRATE 5 MG/5ML IV SOLN
INTRAVENOUS | Status: AC
  Administered 2017-09-24: 5 mg via INTRAVENOUS
  Filled 2017-09-24: qty 5

## 2017-09-24 MED ORDER — SODIUM CHLORIDE 0.9 % IV SOLN
200.0000 mg | Freq: Once | INTRAVENOUS | Status: AC
  Administered 2017-09-24: 200 mg via INTRAVENOUS
  Filled 2017-09-24: qty 20

## 2017-09-24 MED ORDER — ATORVASTATIN CALCIUM 10 MG PO TABS: 20.0000 mg | ORAL_TABLET | Freq: Every day | ORAL | Status: DC

## 2017-09-24 MED ORDER — ASPIRIN EC 325 MG PO TBEC
325.0000 mg | DELAYED_RELEASE_TABLET | Freq: Every day | ORAL | Status: DC
  Administered 2017-09-25 – 2017-09-29 (×4): 325 mg via ORAL
  Filled 2017-09-24 (×5): qty 1

## 2017-09-24 MED ORDER — DILTIAZEM HCL-DEXTROSE 100-5 MG/100ML-% IV SOLN (PREMIX)
5.0000 mg/h | INTRAVENOUS | Status: DC
  Administered 2017-09-24: 5 mg/h via INTRAVENOUS
  Filled 2017-09-24 (×2): qty 100

## 2017-09-24 NOTE — Progress Notes (Signed)
PROGRESS NOTE        PATIENT DETAILS Name: [name] [name] Age: 84 y.o. Sex: female Date of Birth: 1924-07-10 Admit Date: 09/21/2017 Admitting Physician Vianne Bulls, MD EXH:[id number], Butch Penny, MD  Brief Narrative: Patient is a 84 y.o. female with history of left eye blindness (corneal scarring), prior history of CVA on Plavix (loop recorder implanted-SVT detected as outpatient), presented with confusion, visual disturbances, sensory aphasia.  CT angiogram brain and CT perfusion study confirmed a left M3 occlusion with acute infarct involving the temporal cortical area.  She was admitted to the hospitalist service, but then developed generalized tonic-clonic seizure and A. fib with RVR.  She was started on IV Keppra, she spontaneously converted back to sinus rhythm.  Hospital course was complicated by delirium-and excessive sedation after using benzodiazepines (had seizure) and narcotics.  See below for further details  Subjective: Much more awake and alert compared to the past few days.  Had a uneventful night.  She does not follow any of my commands-but is able to follow commands of her daughter.  When her daughter asks her to squeeze my fingers patient is able to do so.  Concerned that she is still somewhat sedated  Assessment/Plan: Acute CVA: Thought to be embolic-atrial fibrillation confirmed on EKG and telemetry monitoring this admission.  CT angiogram of the neck did not show any stenosis, 2D echocardiogram with preserved EF and without any embolic source.  LDL 95, A1c 5.6.  She unfortunately she was already blind in her left eye from corneal scarring, she now appears to have significant amount of right eye cortical blindness and motor aphasia from acute CVA.  She is able to move all 4 extremities.  Currently on aspirin, and high intensity statin.Neurology to decide timing to start anticoagulation.  Evaluated by rehab services-and felt to require CIR who has been consulted.    Acute metabolic encephalopathy: Initially felt to be from postictal state and Ativan that she received yesterday morning.  She became very delirious on 7/4 night and received fentanyl.  This a.m.-she is much better-,-following some commands.  EEG on 7/3 negative for seizures, ABG without hypercarbia.  Will attempt to minimize benzodiazepines and Ativan.    PAF: Maintaining sinus rhythm since yesterday-remains on low-dose IV Lopressor for rate control.  Once speech therapy evaluates-and started on a diet-we will resume Bystolic.  Although she ideally needs long-term anticoagulation, she is now probably blind in both eyes, has issues with delirium-and may need to see how she does for the next few days before actually starting anticoagulation.  She is at significant risk of falls.  Cardiology following.  History of paroxysmal SVT: Documented on loop recorder data (loop recorder implanted after CVA)-continue telemetry monitoring.  Remains on beta-blockers  Generalized tonic-clonic seizure: Occurred on 7/3-after she was admitted-usually started on Keppra-she still is somewhat sedated this morning-I have switched over to Vimpat. EEG on 7/3 did not show seizure-like activity.  Neurology following.   Hypertension: Allow some amount of permissive hypertension-tolerating low-dose IV Lopressor to control her heart rate.  CAD: Currently without any anginal symptoms.  Left eye blindness: She has corneal scarring-unfortunately she may have developed significant visual deficits in her right side from this acute CVA.  Advanced directive/palliative care: Full code for now-the long discussion with daughter and son on 7/4 and 7/5.  Family is still in consultation of whether  to change her to a DNR status.  They however would like to see how she does with rehab services before making any firm decisions.  Family aware that she probably will have some amount of significant visual, speech-and perhaps some amount of  cognitive dysfunction as a result of acute CVA.  Her prognosis is guarded at this time  DVT Prophylaxis: Prophylactic Lovenox   Code Status: Full code  Family Communication: Daughter and son at bedside  Disposition Plan: Remain inpatient-CIR or SNF on discharge  Antimicrobial agents: Anti-infectives (From admission, onward)   None     Procedures: None  CONSULTS:  neurology  Time spent: 64 minutes-Greater than 50% of this time was spent in counseling, explanation of diagnosis, planning of further management, and coordination of care.  MEDICATIONS: Scheduled Meds: . artificial tears  1 application Both Eyes QHS  . aspirin  300 mg Rectal Daily  . atorvastatin  80 mg Oral q1800  . enoxaparin (LOVENOX) injection  40 mg Subcutaneous Q24H  . fentaNYL (SUBLIMAZE) injection  50 mcg Intravenous Once  . magnesium oxide  200 mg Oral QHS  . metoprolol tartrate  2.5 mg Intravenous Q8H  . multivitamin  1 tablet Oral QPC breakfast  . nebivolol  2.5 mg Oral Daily  . ondansetron (ZOFRAN) IV  4 mg Intravenous Once  . prednisoLONE acetate  1 drop Both Eyes QHS   Continuous Infusions: . sodium chloride 40 mL/hr at 09/23/17 1444  . famotidine (PEPCID) IV Stopped (09/24/17 0747)  . lacosamide (VIMPAT) IV     PRN Meds:.acetaminophen **OR** acetaminophen (TYLENOL) oral liquid 160 mg/5 mL **OR** acetaminophen, fentaNYL (SUBLIMAZE) injection, ondansetron (ZOFRAN) IV, polyvinyl alcohol   PHYSICAL EXAM: Vital signs: Vitals:   09/24/17 0104 09/24/17 0532 09/24/17 0820 09/24/17 1132  BP: (!) 154/59 (!) 145/53 (!) 162/85   Pulse: 68 65 78 74  Resp: 16 16 20 20   Temp: 99 F (37.2 C) 98.4 F (36.9 C) 98.6 F (37 C) 97.8 F (36.6 C)  TempSrc: Axillary Axillary Axillary Axillary  SpO2: 94% 97% 97% 100%  Weight:      Height:       Filed Weights   09/21/17 2055  Weight: 75.9 kg (167 lb 5.3 oz)   Body mass index is 29.64 kg/m.   General appearance:Awake-does not open eyes.  Did not  follow any of my commands, but once her family asked her to squeeze my fingers she did follow their commands. HEENT: Atraumatic and Normocephalic Neck: supple, no JVD. Resp:Good air entry bilaterally,no rales or rhonchi CVS: S1 S2 regular, no murmurs.  GI: Bowel sounds present, Non tender and not distended with no gaurding, rigidity or rebound. Extremities: B/L Lower Ext shows no edema, both legs are warm to touch Neurology:  Non focal-pushing me away with both hands-moving her legs. Musculoskeletal:No digital cyanosis Skin:No Rash, warm and dry Wounds:N/A  I have personally reviewed following labs and imaging studies  LABORATORY DATA: CBC: Recent Labs  Lab 09/21/17 1425 09/21/17 1440  WBC 5.3  --   NEUTROABS 4.2  --   HGB 14.5 15.0  HCT 46.0 44.0  MCV 96.2  --   PLT 197  --     Basic Metabolic Panel: Recent Labs  Lab 09/21/17 1425 09/21/17 1440 09/23/17 0721  NA 142 139 138  K 4.2 4.1 3.3*  CL 105 102 103  CO2 26  --  25  GLUCOSE 125* 121* 89  BUN 11 13 13   CREATININE 0.65 0.60 0.72  CALCIUM  9.0  --  8.4*    GFR: Estimated Creatinine Clearance: 45.6 mL/min (by C-G formula based on SCr of 0.72 mg/dL).  Liver Function Tests: Recent Labs  Lab 09/21/17 1425  AST 31  ALT 25  ALKPHOS 94  BILITOT 0.7  PROT 6.5  ALBUMIN 3.9   No results for input(s): LIPASE, AMYLASE in the last 168 hours. No results for input(s): AMMONIA in the last 168 hours.  Coagulation Profile: Recent Labs  Lab 09/21/17 1425  INR 0.96    Cardiac Enzymes: No results for input(s): CKTOTAL, CKMB, CKMBINDEX, TROPONINI in the last 168 hours.  BNP (last 3 results) No results for input(s): PROBNP in the last 8760 hours.  HbA1C: Recent Labs    09/22/17 0441  HGBA1C 5.6    CBG: Recent Labs  Lab 09/21/17 1435 09/22/17 0047  GLUCAP 116* 178*    Lipid Profile: Recent Labs    09/22/17 0441  CHOL 164  HDL 58  LDLCALC 95  TRIG 54  CHOLHDL 2.8    Thyroid Function  Tests: No results for input(s): TSH, T4TOTAL, FREET4, T3FREE, THYROIDAB in the last 72 hours.  Anemia Panel: No results for input(s): VITAMINB12, FOLATE, FERRITIN, TIBC, IRON, RETICCTPCT in the last 72 hours.  Urine analysis:    Component Value Date/Time   COLORURINE YELLOW 09/21/2017 [place] 09/21/2017 1533   LABSPEC 1.013 09/21/2017 1533   PHURINE 7.0 09/21/2017 1533   GLUCOSEU NEGATIVE 09/21/2017 1533   HGBUR NEGATIVE 09/21/2017 1533   BILIRUBINUR NEGATIVE 09/21/2017 1533   KETONESUR 20 (A) 09/21/2017 1533   PROTEINUR NEGATIVE 09/21/2017 1533   UROBILINOGEN 0.2 12/19/2014 1830   NITRITE NEGATIVE 09/21/2017 1533   LEUKOCYTESUR NEGATIVE 09/21/2017 1533    Sepsis Labs: Lactic Acid, Venous    Component Value Date/Time   LATICACIDVEN 1.05 12/23/2012 1836    MICROBIOLOGY: No results found for this or any previous visit (from the past 240 hour(s)).  RADIOLOGY STUDIES/RESULTS: Ct Angio Head W Or Wo Contrast  Result Date: 09/21/2017 CLINICAL DATA:  Initial evaluation for acute aphasia, stroke. EXAM: CT ANGIOGRAPHY HEAD AND NECK CT PERFUSION BRAIN TECHNIQUE: Multidetector CT imaging of the head and neck was performed using the standard protocol during bolus administration of intravenous contrast. Multiplanar CT image reconstructions and MIPs were obtained to evaluate the vascular anatomy. Carotid stenosis measurements (when applicable) are obtained utilizing NASCET criteria, using the distal internal carotid diameter as the denominator. Multiphase CT imaging of the brain was performed following IV bolus contrast injection. Subsequent parametric perfusion maps were calculated using RAPID software. CONTRAST:  2mL ISOVUE-370 IOPAMIDOL (ISOVUE-370) INJECTION 76% COMPARISON:  Prior CT from earlier the same day. FINDINGS: CTA NECK FINDINGS Aortic arch: Visualized aortic arch ectatic with normal 3 vessel morphology. Mild atheromatous plaque within the aortic arch. No  hemodynamically significant stenosis about the origin of the great vessels. Visualized subclavian arteries tortuous proximally but widely patent without stenosis. Right carotid system: Right common carotid artery tortuous proximally but widely patent to the bifurcation. A centric plaque about the bifurcation without hemodynamically significant stenosis. Right ICA mildly tortuous but widely patent to the skull base without stenosis, dissection, or occlusion. Left carotid system: Left common carotid artery tortuous proximally but widely patent to the bifurcation without stenosis. No significant atheromatous narrowing about the left bifurcation. Left ICA tortuous and partially medialized into the retropharyngeal space. Left ICA patent to the skull base without stenosis, dissection, or occlusion. Vertebral arteries: Both of the vertebral arteries arise from the subclavian  arteries. Left vertebral artery dominant. Vertebral arteries tortuous proximally but widely patent to the skull base without stenosis, dissection, or occlusion. Skeleton: No acute osseous abnormality. No worrisome lytic or blastic osseous lesions. Other neck: No acute soft tissue abnormality within the neck. Salivary glands normal. Thyroid normal. No adenopathy. Upper chest: Shotty subcentimeter lymph nodes noted within the visualized upper mediastinum. Scattered atelectatic changes present within the partially visualized lungs. Review of the MIP images confirms the above findings CTA HEAD FINDINGS Anterior circulation: Petrous segments widely patent bilaterally. Mild scattered atheromatous plaque within the cavernous/supraclinoid ICAs without stenosis. ICA termini widely patent. A1 segments patent bilaterally. Left A1 hypoplastic. Normal anterior communicating artery. Anterior cerebral arteries patent to their distal aspects without flow-limiting stenosis. Left M1 patent without stenosis. Normal left MCA bifurcation. There is a proximal left M3  occlusion rising off the inferior division (series 8, image 96). Adjacent inferior M3 branch remains patent. Superior left MCA division remains patent although distally appears attenuated in somewhat irregular. Right M1 mildly irregular and diffusely narrowed without high-grade or flow-limiting stenosis. Normal right MCA bifurcation. No proximal right M2 occlusion. Distal small vessel atheromatous irregularity present throughout the right MCA branches. Posterior circulation: V4 segments patent to the vertebrobasilar junction without flow-limiting stenosis. Left vertebral artery dominant. Posterior inferior cerebral arteries patent bilaterally. Basilar widely patent to its distal aspect without stenosis. Superior cerebral arteries patent bilaterally. Both of the posterior cerebral arteries supplied via the basilar. Scattered atheromatous irregularity throughout the mid and distal PCAs without high-grade correctable stenosis. Venous sinuses: Not well assessed due to arterial timing of the contrast bolus. Anatomic variants: None significant.  No aneurysm. Delayed phase: Not performed. Review of the MIP images confirms the above findings CT Brain Perfusion Findings: CBF (<30%) Volume: 57mL Perfusion (Tmax>6.0s) volume: 88mL Mismatch Volume: 66mL Infarction Location:Ischemic core infarct involves the left temporoccipital region. Fairly small amount of surrounding ischemic penumbra. IMPRESSION: 1. Acute left M3 occlusion, likely embolic. Associated core infarct involving the left temporoccipital region with relatively small volume 11 mL surrounding ischemic penumbra. 2. Atheromatous change involving the intracranial circulation, most notable at the right M1 segment and bilateral PCAs. No hemodynamically significant or correctable stenosis identified. 3. Mild for age atherosclerotic change about the carotid bifurcations without high-grade stenosis. 4. Diffuse tortuosity of the major arterial vasculature of the neck,  suggesting chronic underlying hypertension. Critical Value/emergent results were called by telephone at the time of interpretation on 09/21/2017 at 5:59 pm to Dr. Cheral Marker, who verbally acknowledged these results. Electronically Signed   By: Jeannine Boga M.D.   On: 09/21/2017 18:30   Ct Head Wo Contrast  Result Date: 09/21/2017 CLINICAL DATA:  Altered mental status. EXAM: CT HEAD WITHOUT CONTRAST TECHNIQUE: Contiguous axial images were obtained from the base of the skull through the vertex without intravenous contrast. COMPARISON:  CT head and MRI brain 03/18/2017. FINDINGS: Brain: There is a new large area of encephalomalacia within the right occipital lobe consistent with an interval stroke (axial images 13 through 17/2). No evidence of acute intracranial hemorrhage, mass lesion, brain edema or extra-axial fluid collection. Patchy low-density in the periventricular white matter appears stable. The ventricles and subarachnoid spaces are appropriately sized for age. There is no CT evidence of acute cortical infarction. Vascular: Intracranial vascular calcifications. No hyperdense vessel identified. Skull: Negative for fracture or focal lesion. Sinuses/Orbits: The visualized paranasal sinuses and mastoid air cells are clear. Hypoplastic frontal sinuses. No orbital abnormalities are seen. Other: None. IMPRESSION: 1. No acute intracranial findings. 2.  Interval development of a large area of encephalomalacia in the right occipital lobe consistent with a stroke sustained over the last 6 months. This has no definite acute components but could be further evaluated with MRI if clinically warranted. Electronically Signed   By: Richardean Sale M.D.   On: 09/21/2017 14:58   Ct Angio Neck W Or Wo Contrast  Result Date: 09/21/2017 CLINICAL DATA:  Initial evaluation for acute aphasia, stroke. EXAM: CT ANGIOGRAPHY HEAD AND NECK CT PERFUSION BRAIN TECHNIQUE: Multidetector CT imaging of the head and neck was performed  using the standard protocol during bolus administration of intravenous contrast. Multiplanar CT image reconstructions and MIPs were obtained to evaluate the vascular anatomy. Carotid stenosis measurements (when applicable) are obtained utilizing NASCET criteria, using the distal internal carotid diameter as the denominator. Multiphase CT imaging of the brain was performed following IV bolus contrast injection. Subsequent parametric perfusion maps were calculated using RAPID software. CONTRAST:  78mL ISOVUE-370 IOPAMIDOL (ISOVUE-370) INJECTION 76% COMPARISON:  Prior CT from earlier the same day. FINDINGS: CTA NECK FINDINGS Aortic arch: Visualized aortic arch ectatic with normal 3 vessel morphology. Mild atheromatous plaque within the aortic arch. No hemodynamically significant stenosis about the origin of the great vessels. Visualized subclavian arteries tortuous proximally but widely patent without stenosis. Right carotid system: Right common carotid artery tortuous proximally but widely patent to the bifurcation. A centric plaque about the bifurcation without hemodynamically significant stenosis. Right ICA mildly tortuous but widely patent to the skull base without stenosis, dissection, or occlusion. Left carotid system: Left common carotid artery tortuous proximally but widely patent to the bifurcation without stenosis. No significant atheromatous narrowing about the left bifurcation. Left ICA tortuous and partially medialized into the retropharyngeal space. Left ICA patent to the skull base without stenosis, dissection, or occlusion. Vertebral arteries: Both of the vertebral arteries arise from the subclavian arteries. Left vertebral artery dominant. Vertebral arteries tortuous proximally but widely patent to the skull base without stenosis, dissection, or occlusion. Skeleton: No acute osseous abnormality. No worrisome lytic or blastic osseous lesions. Other neck: No acute soft tissue abnormality within the neck.  Salivary glands normal. Thyroid normal. No adenopathy. Upper chest: Shotty subcentimeter lymph nodes noted within the visualized upper mediastinum. Scattered atelectatic changes present within the partially visualized lungs. Review of the MIP images confirms the above findings CTA HEAD FINDINGS Anterior circulation: Petrous segments widely patent bilaterally. Mild scattered atheromatous plaque within the cavernous/supraclinoid ICAs without stenosis. ICA termini widely patent. A1 segments patent bilaterally. Left A1 hypoplastic. Normal anterior communicating artery. Anterior cerebral arteries patent to their distal aspects without flow-limiting stenosis. Left M1 patent without stenosis. Normal left MCA bifurcation. There is a proximal left M3 occlusion rising off the inferior division (series 8, image 96). Adjacent inferior M3 branch remains patent. Superior left MCA division remains patent although distally appears attenuated in somewhat irregular. Right M1 mildly irregular and diffusely narrowed without high-grade or flow-limiting stenosis. Normal right MCA bifurcation. No proximal right M2 occlusion. Distal small vessel atheromatous irregularity present throughout the right MCA branches. Posterior circulation: V4 segments patent to the vertebrobasilar junction without flow-limiting stenosis. Left vertebral artery dominant. Posterior inferior cerebral arteries patent bilaterally. Basilar widely patent to its distal aspect without stenosis. Superior cerebral arteries patent bilaterally. Both of the posterior cerebral arteries supplied via the basilar. Scattered atheromatous irregularity throughout the mid and distal PCAs without high-grade correctable stenosis. Venous sinuses: Not well assessed due to arterial timing of the contrast bolus. Anatomic variants: None significant.  No  aneurysm. Delayed phase: Not performed. Review of the MIP images confirms the above findings CT Brain Perfusion Findings: CBF (<30%)  Volume: 24mL Perfusion (Tmax>6.0s) volume: 93mL Mismatch Volume: 44mL Infarction Location:Ischemic core infarct involves the left temporoccipital region. Fairly small amount of surrounding ischemic penumbra. IMPRESSION: 1. Acute left M3 occlusion, likely embolic. Associated core infarct involving the left temporoccipital region with relatively small volume 11 mL surrounding ischemic penumbra. 2. Atheromatous change involving the intracranial circulation, most notable at the right M1 segment and bilateral PCAs. No hemodynamically significant or correctable stenosis identified. 3. Mild for age atherosclerotic change about the carotid bifurcations without high-grade stenosis. 4. Diffuse tortuosity of the major arterial vasculature of the neck, suggesting chronic underlying hypertension. Critical Value/emergent results were called by telephone at the time of interpretation on 09/21/2017 at 5:59 pm to Dr. Cheral Marker, who verbally acknowledged these results. Electronically Signed   By: Jeannine Boga M.D.   On: 09/21/2017 18:30   Ct Cerebral Perfusion W Contrast  Result Date: 09/21/2017 CLINICAL DATA:  Initial evaluation for acute aphasia, stroke. EXAM: CT ANGIOGRAPHY HEAD AND NECK CT PERFUSION BRAIN TECHNIQUE: Multidetector CT imaging of the head and neck was performed using the standard protocol during bolus administration of intravenous contrast. Multiplanar CT image reconstructions and MIPs were obtained to evaluate the vascular anatomy. Carotid stenosis measurements (when applicable) are obtained utilizing NASCET criteria, using the distal internal carotid diameter as the denominator. Multiphase CT imaging of the brain was performed following IV bolus contrast injection. Subsequent parametric perfusion maps were calculated using RAPID software. CONTRAST:  72mL ISOVUE-370 IOPAMIDOL (ISOVUE-370) INJECTION 76% COMPARISON:  Prior CT from earlier the same day. FINDINGS: CTA NECK FINDINGS Aortic arch: Visualized aortic  arch ectatic with normal 3 vessel morphology. Mild atheromatous plaque within the aortic arch. No hemodynamically significant stenosis about the origin of the great vessels. Visualized subclavian arteries tortuous proximally but widely patent without stenosis. Right carotid system: Right common carotid artery tortuous proximally but widely patent to the bifurcation. A centric plaque about the bifurcation without hemodynamically significant stenosis. Right ICA mildly tortuous but widely patent to the skull base without stenosis, dissection, or occlusion. Left carotid system: Left common carotid artery tortuous proximally but widely patent to the bifurcation without stenosis. No significant atheromatous narrowing about the left bifurcation. Left ICA tortuous and partially medialized into the retropharyngeal space. Left ICA patent to the skull base without stenosis, dissection, or occlusion. Vertebral arteries: Both of the vertebral arteries arise from the subclavian arteries. Left vertebral artery dominant. Vertebral arteries tortuous proximally but widely patent to the skull base without stenosis, dissection, or occlusion. Skeleton: No acute osseous abnormality. No worrisome lytic or blastic osseous lesions. Other neck: No acute soft tissue abnormality within the neck. Salivary glands normal. Thyroid normal. No adenopathy. Upper chest: Shotty subcentimeter lymph nodes noted within the visualized upper mediastinum. Scattered atelectatic changes present within the partially visualized lungs. Review of the MIP images confirms the above findings CTA HEAD FINDINGS Anterior circulation: Petrous segments widely patent bilaterally. Mild scattered atheromatous plaque within the cavernous/supraclinoid ICAs without stenosis. ICA termini widely patent. A1 segments patent bilaterally. Left A1 hypoplastic. Normal anterior communicating artery. Anterior cerebral arteries patent to their distal aspects without flow-limiting  stenosis. Left M1 patent without stenosis. Normal left MCA bifurcation. There is a proximal left M3 occlusion rising off the inferior division (series 8, image 96). Adjacent inferior M3 branch remains patent. Superior left MCA division remains patent although distally appears attenuated in somewhat irregular. Right M1 mildly  irregular and diffusely narrowed without high-grade or flow-limiting stenosis. Normal right MCA bifurcation. No proximal right M2 occlusion. Distal small vessel atheromatous irregularity present throughout the right MCA branches. Posterior circulation: V4 segments patent to the vertebrobasilar junction without flow-limiting stenosis. Left vertebral artery dominant. Posterior inferior cerebral arteries patent bilaterally. Basilar widely patent to its distal aspect without stenosis. Superior cerebral arteries patent bilaterally. Both of the posterior cerebral arteries supplied via the basilar. Scattered atheromatous irregularity throughout the mid and distal PCAs without high-grade correctable stenosis. Venous sinuses: Not well assessed due to arterial timing of the contrast bolus. Anatomic variants: None significant.  No aneurysm. Delayed phase: Not performed. Review of the MIP images confirms the above findings CT Brain Perfusion Findings: CBF (<30%) Volume: 67mL Perfusion (Tmax>6.0s) volume: 27mL Mismatch Volume: 44mL Infarction Location:Ischemic core infarct involves the left temporoccipital region. Fairly small amount of surrounding ischemic penumbra. IMPRESSION: 1. Acute left M3 occlusion, likely embolic. Associated core infarct involving the left temporoccipital region with relatively small volume 11 mL surrounding ischemic penumbra. 2. Atheromatous change involving the intracranial circulation, most notable at the right M1 segment and bilateral PCAs. No hemodynamically significant or correctable stenosis identified. 3. Mild for age atherosclerotic change about the carotid bifurcations  without high-grade stenosis. 4. Diffuse tortuosity of the major arterial vasculature of the neck, suggesting chronic underlying hypertension. Critical Value/emergent results were called by telephone at the time of interpretation on 09/21/2017 at 5:59 pm to Dr. Cheral Marker, who verbally acknowledged these results. Electronically Signed   By: Jeannine Boga M.D.   On: 09/21/2017 18:30     LOS: 3 days   Oren Binet, MD  [facility]  If 7PM-7AM, please contact night-coverage  Please page via [email or web address]-click on MD name and type text message  09/24/2017, 11:41 AM

## 2017-09-24 NOTE — Progress Notes (Addendum)
Progress Note  Patient Name: [name] [name] Date of Encounter: 09/24/2017  Primary Cardiologist: [name], MD   Subjective   Patient sitting up in bedside chair. Minimally interactive. Son at bedside reports she was able to stand with PT today. She has made a few attempts to communicate by speaking single words. Son states she is still confused. Was cleared for CLD today but is not consistently taking oral foods.   Inpatient Medications    Scheduled Meds: . artificial tears  1 application Both Eyes QHS  . aspirin  300 mg Rectal Daily  . atorvastatin  80 mg Oral q1800  . enoxaparin (LOVENOX) injection  40 mg Subcutaneous Q24H  . fentaNYL (SUBLIMAZE) injection  50 mcg Intravenous Once  . magnesium oxide  200 mg Oral QHS  . metoprolol tartrate  2.5 mg Intravenous Q8H  . multivitamin  1 tablet Oral QPC breakfast  . nebivolol  2.5 mg Oral Daily  . ondansetron (ZOFRAN) IV  4 mg Intravenous Once  . prednisoLONE acetate  1 drop Both Eyes QHS   Continuous Infusions: . sodium chloride 40 mL/hr at 09/23/17 1444  . famotidine (PEPCID) IV Stopped (09/24/17 0747)  . lacosamide (VIMPAT) IV     PRN Meds: acetaminophen **OR** acetaminophen (TYLENOL) oral liquid 160 mg/5 mL **OR** acetaminophen, fentaNYL (SUBLIMAZE) injection, ondansetron (ZOFRAN) IV, polyvinyl alcohol   Vital Signs    Vitals:   09/24/17 0104 09/24/17 0532 09/24/17 0820 09/24/17 1132  BP: (!) 154/59 (!) 145/53 (!) 162/85   Pulse: 68 65 78 74  Resp: 16 16 20 20   Temp: 99 F (37.2 C) 98.4 F (36.9 C) 98.6 F (37 C) 97.8 F (36.6 C)  TempSrc: Axillary Axillary Axillary Axillary  SpO2: 94% 97% 97% 100%  Weight:      Height:        Intake/Output Summary (Last 24 hours) at 09/24/2017 1142 Last data filed at 09/24/2017 1040 Gross per 24 hour  Intake 905.11 ml  Output 1 ml  Net 904.11 ml   Filed Weights   09/21/17 2055  Weight: 75.9 kg (167 lb 5.3 oz)    Telemetry    No evidence of atrial fibrillation since  yesterday around 8am; sinus rhythm with occasional PVCs - Personally Reviewed   Physical Exam   GEN: Sitting upright in bedside chair in no acute distress. Does not respond to questions or instructions Neck: No JVD, no carotid bruits Cardiac: RRR, no murmurs, rubs, or gallops.  Respiratory: Clear to auscultation bilaterally, no wheezes/ rales/ rhonchi GI: NABS, Soft, nontender, non-distended  MS: No edema; No deformity. Neuro:  Nonfocal, moving all extremities spontaneously Psych: Normal affect   Labs    Chemistry Recent Labs  Lab 09/21/17 1425 09/21/17 1440 09/23/17 0721  NA 142 139 138  K 4.2 4.1 3.3*  CL 105 102 103  CO2 26  --  25  GLUCOSE 125* 121* 89  BUN 11 13 13   CREATININE 0.65 0.60 0.72  CALCIUM 9.0  --  8.4*  PROT 6.5  --   --   ALBUMIN 3.9  --   --   AST 31  --   --   ALT 25  --   --   ALKPHOS 94  --   --   BILITOT 0.7  --   --   GFRNONAA >60  --  >60  GFRAA >60  --  >60  ANIONGAP 11  --  10     Hematology Recent Labs  Lab 09/21/17  1425 09/21/17 1440  WBC 5.3  --   RBC 4.78  --   HGB 14.5 15.0  HCT 46.0 44.0  MCV 96.2  --   MCH 30.3  --   MCHC 31.5  --   RDW 13.0  --   PLT 197  --     Cardiac EnzymesNo results for input(s): TROPONINI in the last 168 hours.  Recent Labs  Lab 09/21/17 1438  TROPIPOC 0.00     BNPNo results for input(s): BNP, PROBNP in the last 168 hours.   DDimer No results for input(s): DDIMER in the last 168 hours.   Radiology    No results found.  Cardiac Studies   Echo 09/23/17: Study Conclusions  - Left ventricle: The cavity size was normal. Wall thickness was   normal. Systolic function was normal. The estimated ejection   fraction was in the range of 60% to 65%. Wall motion was normal;   there were no regional wall motion abnormalities. Features are   consistent with a pseudonormal left ventricular filling pattern,   with concomitant abnormal relaxation and increased filling   pressure (grade 2  diastolic dysfunction). - Aortic valve: Mildly calcified annulus. Trileaflet; mildly   calcified leaflets. There was mild regurgitation. - Mitral valve: There was trivial regurgitation. - Left atrium: The atrium was at the upper limits of normal in   size. - Right atrium: Central venous pressure (est): 3 mm Hg. - Atrial septum: No defect or patent foramen ovale was identified. - Tricuspid valve: There was mild regurgitation. - Pulmonary arteries: PA peak pressure: 30 mm Hg (S). - Pericardium, extracardiac: There was no pericardial effusion.  Nuclear stress test 07/2017:  Nuclear stress EF: 67%.  There was no ST segment deviation noted during stress.  The study is normal.  This is a low risk study.  The left ventricular ejection fraction is hyperdynamic (>65%).  Normal resting and stress perfusion. No ischemia or infarction EF 67%   Loop recorder device interrogation 07/2017: Loop check in clinic. Battery status: EOS since 04/11/2017. Rwaves 0.85mV. 0symptom episodes, 92 tachy episodes - hx of SVT, 0pause episodes, 0 brady episodes. 4AF episodes (<1% burden) - available EGMs show PACs.   Patient Profile     84 y.o. female with a PMH of CAD s/p NSTEMI 2015 (medical management), HTN, SVT, CVA with subsequent left eye blindness, who presented to the ED 09/21/17 with confusion, aphasia, and vision loss, and was found to have an acute embolic stroke, and is being followed by Cardiology for the evaluation of atrial fibrillation     Assessment & Plan    1. Paroxysmal atrial fibrillation: admitted with acute embolic stroke. Found to have brief episode of Afib RVR during seizure activity 7/3. Unfortunately loop recorder has reached end of life and were unable to interrogate this admission. She has had intermittent episodes of Afib this admission. Echo 09/23/17 with EF 60-65%, no wall motion abnormalities, and G2DD. She was started on IV metoprolol 2.5mg  q8h for rate control yesterday  given inability to tolerate po in current state.  - Recommend starting eliquis 5mg  BID for stroke prevention once cleared by neurology. On hold given size of stroke. Per neurology, can likely start 5-7 days post-stroke. - Her CHA2DS2-VASc Score is 7(HTN, Vasc, Female, Age>75, and prior stroke) - Recommend transition to home bystolic for rate control when consistently taking po medications. Has been intolerant to alternative BBlockers in the past  2. Acute embolic stroke: thought to be 2/2 atrial fibrillation.  Neurology following. Planning to initiate anticoagulation with eliquis 5mg  BID 5-7 days post-stroke - Continue ASA, plavix, and statin - can likely discontinue ASA with initiation of eliquis to minimize bleeding - Continue management per primary team/ neurology  3. CAD s/p NSTEMI 2015: medically managed at that time. NST 07/2017 without ischemia. Echo 09/23/17 with EF 60-65%, no wall motion abnormalities, and G2DD.  - Continue ASA, plavix, and statin - can likely discontinue ASA with initiation of eliquis to minimize bleeding  4. HTN: BP elevated - allowing permissive HTN given recent stroke.  - Continue to monitor closely - Recommend transition to home bystolic when consistently taking po medications. Has been intolerant to alternative BBlockers in the past  5. SVT: Has had intermittent episodes on telemetry; none in the past 24 hours.  - Continue IV metoprolol to minimize tachyarrhythmias - Recommend transition to home bystolic for rate control when consistently taking po medications. Has been intolerant to alternative BBlockers in the past  6. HLD: LDL 95 this admission. Started on atorvastatin - Continue statin     For questions or updates, please contact [place] Please consult [email or web address] for contact info under Cardiology/STEMI.      Signed, Abigail Butts, PA-C  09/24/2017, 11:42 AM   [phone]  I have seen and examined the patient along with Abigail Butts, PA-C.  I have reviewed the chart, notes and new data.  I agree with PA/NP's note.  Key new complaints: Reviewed old records.  Her palpitations were generally well controlled with Bystolic.  Other beta-blockers caused excessive fatigue. Key examination changes: Currently in normal rhythm, no signs of hypervolemia. Key new findings / data: Telemetry shows occasional relatively brief episodes of paroxysmal atrial fibrillation (approximately 1 hour), with rapid ventricular response.  She did not appear to be hemodynamically or symptomatically decompensated during atrial fibrillation.  PLAN: When she is consistently taking oral medications, plan to restart treatment with Bystolic and to initiate an oral anticoagulant.  Safety from falls and injuries will be a big concern due to her visual impairment.  Nevertheless, at this point the risk of recurrent stroke appears to outweigh the risk of bleeding complications.  Once she starts taking an oral anticoagulant I would stop not only aspirin but also stop the clopidogrel.  It has been about 4 years since her episode of acute myocardial infarction and she has preserved left ventricular systolic function and no evidence of ischemia on a very recent nuclear stress test.  The benefit of antiplatelet agents appears to be less than the risk of bleeding on combination therapy with oral anticoagulants.  [facility] will sign off.   Medication Recommendations: Restart Bystolic 2.5 mg p.o. once daily and start Eliquis 5 mg p.o. twice daily Other recommendations (labs, testing, etc):  n/a Follow up as an outpatient: Keep appointment December 01, 2017 with Dr. Loretha Stapler, MD, [facility] [phone] 09/24/2017, 12:44 PM

## 2017-09-24 NOTE — Progress Notes (Signed)
MEDICATION RELATED NOTE   Pharmacy Re:  Pravastatin  Allergies  Allergen Reactions  . Contrast Media [Iodinated Diagnostic Agents] Anaphylaxis  . Fluorescein Anaphylaxis, Shortness Of Breath, Itching and Swelling  . Hydralazine Hcl Anaphylaxis, Swelling, Palpitations and Rash  . Sulfa Antibiotics Rash and Other (See Comments)    Other Reaction: red bumps  . Ultram [Tramadol] Hives  . Yellow Dye Anaphylaxis, Itching, Swelling and Other (See Comments)    Fluorescein (in eye drops)  . Buprenex [Buprenorphine Hcl] Palpitations and Other (See Comments)  . Keflex [Cephalexin] Diarrhea  . Lipitor [Atorvastatin] Other (See Comments)    Muscle weakness  . Restasis [Cyclosporine] Swelling    redness of face with slight swelling   . Augmentin [Amoxicillin-Pot Clavulanate] Nausea And Vomiting  . Levofloxacin Other (See Comments)    Reaction not recalled  . Morphine And Related Other (See Comments)    agitation   . Percocet [Oxycodone-Acetaminophen] Nausea And Vomiting  . Tape Other (See Comments)    Adhesive tape pulls off the skin Able to tolerate paper tape  . Zetia [Ezetimibe] Other (See Comments)    Myalgias   Labs: Recent Labs    09/23/17 0721  CREATININE 0.72   Assessment: Patient with allergies to Atorvastatin and Ezetimibe.  Spoke with Dr. Erlinda Hong, will try lower potency statin (Pravastatin) to see if she can tolerate this.  Plan:  Pravastatin 20mg  daily Will ask nurse to monitor patient while hospitalized for muscle weakness/myalgia.  Rober Minion, PharmD., MS Clinical Pharmacist Pager:  [phone] Thank you for allowing pharmacy to be part of this patients care team. 09/24/2017,5:20 PM

## 2017-09-24 NOTE — Evaluation (Signed)
Speech Language Pathology Evaluation Patient Details Name: [name] [name] MRN: [id number] DOB: 14-Oct-1924 Today's Date: 09/24/2017 Time: 7001-7494 SLP Time Calculation (min) (ACUTE ONLY): 10 min  Problem List:  Patient Active Problem List   Diagnosis Date Noted  . Seizures ([place])   . PAF (paroxysmal atrial fibrillation) ([place])   . Cerebral embolism with cerebral infarction 09/21/2017  . Acute CVA (cerebrovascular accident) ([place]) 09/21/2017  . Blind 04/02/2017  . Visual field cut- right 04/02/2017  . Gastroesophageal reflux disease   . Pre-diabetes   . Blind left eye- (Fuch's disease) 03/05/2015  . Essential hypertension 12/19/2014  . CAD (coronary artery disease) 12/19/2014  . History of Rt brain stroke Oct 2015 06/14/2014  . Headache 06/14/2014  . Malignant hypertension 01/20/2014  . Aphasia 01/04/2014  . Paresthesia 01/04/2014  . Generalized abdominal pain 01/01/2014  . History of diverticulitis 01/01/2014  . Diaphoresis 12/29/2012  . Sleep disturbance 10/03/2012  . Depression 07/12/2012  . OA (osteoarthritis) of knee 06/27/2012  . History of PSVT 12/15/2011   Past Medical History:  Past Medical History:  Diagnosis Date  . Arthritis    "thumbs, joints" (03/23/2017  . Basal cell carcinoma    "several; scattered over my face, hands, leg some cut off; some burned off"  . Blind left eye    a. Corneal transplant x3 with rejection  . CAD (coronary artery disease)    a. s/p NSTEMI 2015 treated conservatively; nuclear stress test low risk. b. Continued angina despite med rx 07/2015 - s/p Bio Freedom Stent to ramus intermedius with mod LAD stenosis neg by FFR. // Nuc study 5/19:  EF 67, no ischemia or scar; Low Risk   . Complication of anesthesia    "very sensitive to RX"  . Diverticulitis large intestine   . Diverticulosis   . Family history of adverse reaction to anesthesia    "we all get PONV" (1/1/201)  . Gastritis   . GERD (gastroesophageal reflux disease)   . Hayfever   .  Helicobacter pylori (H. pylori) 05/22/02   RUT-Positive  . Hypertension   . Myocardial infarction ([place]) 2015  . Squamous carcinoma    "several; scattered over my face, hands, leg some cut off; some burned off"  . Stroke [facility]) 2015   denies residual on 08/07/2015  . Stroke [facility]) 03/18/2017   "has effected her vision, balance, some memory" (03/23/2017)  . SVT (supraventricular tachycardia) ([facility])    a. seen by Dr. Caryl Comes - has loop recorder in. Patient has declined amiodarone due to side effect profile  . Varicose veins    Past Surgical History:  Past Surgical History:  Procedure Laterality Date  . BASAL CELL CARCINOMA EXCISION     "several; scattered over my face, hands, leg some cut off; some burned off"  . CARDIAC CATHETERIZATION N/A 08/07/2015   Procedure: Left Heart Cath and Coronary Angiography;  Surgeon: [name], MD;  Location: [place] [facility];  Service: Cardiovascular;  Laterality: N/A;  . CARDIAC CATHETERIZATION N/A 08/07/2015   Procedure: Coronary Stent Intervention;  Surgeon: [name], MD;  Location: [place] [facility];  Service: Cardiovascular;  Laterality: N/A;  . CARDIAC CATHETERIZATION N/A 08/07/2015   Procedure: Intravascular Pressure Wire/FFR Study;  Surgeon: [name], MD;  Location: [place] [facility];  Service: Cardiovascular;  Laterality: N/A;  . CATARACT EXTRACTION W/ INTRAOCULAR LENS  IMPLANT, BILATERAL Bilateral 2005  . CLOSED REDUCTION SHOULDER DISLOCATION Left 2016 X 2  . CORNEAL TRANSPLANT Bilateral right 2009, left 2009  3 in left eye (last 2 failed), 1 in right eye  . DILATION AND CURETTAGE OF UTERUS    . EYE SURGERY    . JOINT REPLACEMENT    . [place]   "had gangrene in it"  . LOOP RECORDER IMPLANT N/A 01/05/2014   Procedure: LOOP RECORDER IMPLANT;  Surgeon: Coralyn Mark, MD;  Location: [place] [facility];  Service: Cardiovascular;  Laterality: N/A;  . SQUAMOUS CELL CARCINOMA EXCISION     "several; scattered over my  face, hands, leg some cut off; some burned off"  . TONSILLECTOMY    . TOTAL ABDOMINAL HYSTERECTOMY  11/1983   "ovaries and all"  . TOTAL KNEE ARTHROPLASTY Left 06/27/2012   Procedure: LEFT TOTAL KNEE ARTHROPLASTY;  Surgeon: Gearlean Alf, MD;  Location: [facility];  Service: Orthopedics;  Laterality: Left;  . TOTAL KNEE ARTHROPLASTY Right 12/12/2012   Procedure: RIGHT TOTAL KNEE ARTHROPLASTY;  Surgeon: Gearlean Alf, MD;  Location: [facility];  Service: Orthopedics;  Laterality: Right;  . TUBAL LIGATION  1966   HPI:  [name] [name] is a 84 y.o. female with medical history significant for coronary artery disease, hypertension, left eye blindness, and history of CVA with residual right-sided visual deficits. Presents with aphasia, confusion, and headache, CTA head and neck reveals acute M3 occlusion with associated infarct in posterior left temporal lobe with 1 mm penumbra. Pt with a witnessed seizure overnight.    Assessment / Plan / Recommendation Clinical Impression  Pt presents for evaluation with eyes closed but awake. Per son's report, pt has received medication which contributed to pt sleeping "for 2 days." At this time, pt presents with significant cognitive impairments and likely global aphasia. Pt's verbal output contains language of confusion vs jargon and receptively she is not able to follow any directions. Pt is not able to focus attention to task, requires Total A hand over hand to hold cup and demonstrates overall significant confusion as evidenced by removing oxygen and attempting to remove other lines. Pt decreased task tolerance when SLP attempts to place cup in hand and requires gentle Total verbal cues and mutliple attempts by SLP or pt demonstrated increased agitation. Recommend ST follow acutely for cognitive-linguistic therapy.     SLP Assessment  SLP Recommendation/Assessment: Patient needs continued Speech Lanaguage Pathology Services SLP Visit Diagnosis: Cognitive communication  deficit (R41.841)    Follow Up Recommendations  Inpatient Rehab    Frequency and Duration min 2x/week  2 weeks      SLP Evaluation Cognition  Overall Cognitive Status: Difficult to assess Arousal/Alertness: Awake/alert(but eyes remained closed) Orientation Level: Oriented to person Attention: Focused Focused Attention: Impaired Focused Attention Impairment: Verbal basic;Functional basic Problem Solving: (overall general confusion) Safety/Judgment: Impaired Comments: d/t confusion, inability to open eyes, follow directions, taking Oxygen off       Comprehension  Auditory Comprehension Overall Auditory Comprehension: Impaired(likely related to cognition)    Expression Expression Primary Mode of Expression: Verbal Verbal Expression Overall Verbal Expression: Impaired(language of confusion vs. jargon) Pragmatics: Impairment Written Expression Dominant Hand: Right   Oral / Motor  Oral Motor/Sensory Function Overall Oral Motor/Sensory Function: (unable to assess d/t cognition) Motor Speech Motor Planning: (no observed)   GO                    [name] [name] 09/24/2017, 10:31 AM

## 2017-09-24 NOTE — Care Management Note (Signed)
Case Management Note  Patient Details  Name: [name] [name] MRN: [id number] Date of Birth: 02-Mar-1925  Subjective/Objective:           Pt admitted with CVA. She is from home alone.          Action/Plan: PT/OT recommending CIR. CIR unsure of tolerance. CM following for d/c disposition.   Expected Discharge Date:                  Expected Discharge Plan:  [place]  In-House Referral:     Discharge planning Services  CM Consult  Post Acute Care Choice:    Choice offered to:     DME Arranged:    DME Agency:     HH Arranged:    [place] Agency:     Status of Service:  In process, will continue to follow  If discussed at Long Length of Stay Meetings, dates discussed:    Additional Comments:  Pollie Friar, RN 09/24/2017, 2:33 PM

## 2017-09-24 NOTE — Evaluation (Signed)
Clinical/Bedside Swallow Evaluation Patient Details  Name: [name] [name] MRN: [id number] Date of Birth: 1924-10-19  Today's Date: 09/24/2017 Time: SLP Start Time (ACUTE ONLY): 3557 SLP Stop Time (ACUTE ONLY): 0845 SLP Time Calculation (min) (ACUTE ONLY): 10 min  Past Medical History:  Past Medical History:  Diagnosis Date  . Arthritis    "thumbs, joints" (03/23/2017  . Basal cell carcinoma    "several; scattered over my face, hands, leg some cut off; some burned off"  . Blind left eye    a. Corneal transplant x3 with rejection  . CAD (coronary artery disease)    a. s/p NSTEMI 2015 treated conservatively; nuclear stress test low risk. b. Continued angina despite med rx 07/2015 - s/p Bio Freedom Stent to ramus intermedius with mod LAD stenosis neg by FFR. // Nuc study 5/19:  EF 67, no ischemia or scar; Low Risk   . Complication of anesthesia    "very sensitive to RX"  . Diverticulitis large intestine   . Diverticulosis   . Family history of adverse reaction to anesthesia    "we all get PONV" (1/1/201)  . Gastritis   . GERD (gastroesophageal reflux disease)   . Hayfever   . Helicobacter pylori (H. pylori) 05/22/02   RUT-Positive  . Hypertension   . Myocardial infarction ([place]) 2015  . Squamous carcinoma    "several; scattered over my face, hands, leg some cut off; some burned off"  . Stroke [facility]) 2015   denies residual on 08/07/2015  . Stroke [facility]) 03/18/2017   "has effected her vision, balance, some memory" (03/23/2017)  . SVT (supraventricular tachycardia) (HCC)    a. seen by Dr. Caryl Comes - has loop recorder in. Patient has declined amiodarone due to side effect profile  . Varicose veins    Past Surgical History:  Past Surgical History:  Procedure Laterality Date  . BASAL CELL CARCINOMA EXCISION     "several; scattered over my face, hands, leg some cut off; some burned off"  . CARDIAC CATHETERIZATION N/A 08/07/2015   Procedure: Left Heart Cath and Coronary Angiography;  Surgeon:  [name], MD;  Location: [facility];  Service: Cardiovascular;  Laterality: N/A;  . CARDIAC CATHETERIZATION N/A 08/07/2015   Procedure: Coronary Stent Intervention;  Surgeon: [name], MD;  Location: [place] [facility];  Service: Cardiovascular;  Laterality: N/A;  . CARDIAC CATHETERIZATION N/A 08/07/2015   Procedure: Intravascular Pressure Wire/FFR Study;  Surgeon: [name], MD;  Location: [place] [facility];  Service: Cardiovascular;  Laterality: N/A;  . CATARACT EXTRACTION W/ INTRAOCULAR LENS  IMPLANT, BILATERAL Bilateral 2005  . CLOSED REDUCTION SHOULDER DISLOCATION Left 2016 X 2  . CORNEAL TRANSPLANT Bilateral right 2009, left 2009    3 in left eye (last 2 failed), 1 in right eye  . DILATION AND CURETTAGE OF UTERUS    . EYE SURGERY    . JOINT REPLACEMENT    . [place]   "had gangrene in it"  . LOOP RECORDER IMPLANT N/A 01/05/2014   Procedure: LOOP RECORDER IMPLANT;  Surgeon: Coralyn Mark, MD;  Location: [facility];  Service: Cardiovascular;  Laterality: N/A;  . SQUAMOUS CELL CARCINOMA EXCISION     "several; scattered over my face, hands, leg some cut off; some burned off"  . TONSILLECTOMY    . TOTAL ABDOMINAL HYSTERECTOMY  11/1983   "ovaries and all"  . TOTAL KNEE ARTHROPLASTY Left 06/27/2012   Procedure: LEFT TOTAL KNEE ARTHROPLASTY;  Surgeon: Gearlean Alf, MD;  Location: [facility];  Service: Orthopedics;  Laterality: Left;  . TOTAL KNEE ARTHROPLASTY Right 12/12/2012   Procedure: RIGHT TOTAL KNEE ARTHROPLASTY;  Surgeon: Gearlean Alf, MD;  Location: [facility];  Service: Orthopedics;  Laterality: Right;  . TUBAL LIGATION  1966   HPI:  [name] [name] is a 84 y.o. female with medical history significant for coronary artery disease, hypertension, left eye blindness, and history of CVA with residual right-sided visual deficits. Presents with aphasia, confusion, and headache, CTA head and neck reveals acute M3 occlusion with associated infarct  in posterior left temporal lobe with 1 mm penumbra. Pt with a witnessed seizure overnight.    Assessment / Plan / Recommendation Clinical Impression  Pt presents with cognitively based dysphagia c/b by inability to open mouth without prefunctional Total A hand over hand to place cup in pt's hand and bring to mouth. Pt consumed 8 oz thin water via cup with subtle and intermittent delayed throat clearing. Per chart review, at baseline (03/2017) but presents with subtle thorat clears. With Total A for spoon in hand, pt recieved half bolus applesauce with no immediate oral manipulation. Pt is not responsive to directions to open mouth, liquid wash offered and received but largely unable to observe safety with puree. Therefore recommend upgrading to clear liquid diet via cup, Total A for cup in hand, no straw and medicine via alternate means. ST will continue to follow acutely for further diet upgrade.  SLP Visit Diagnosis: Dysphagia, unspecified (R13.10)    Aspiration Risk  Severe aspiration risk;Risk for inadequate nutrition/hydration(d/t cognition deficits)    Diet Recommendation (clear liquid diet)   Liquid Administration via: Cup Medication Administration: Via alternative means Supervision: Full supervision/cueing for compensatory strategies;Staff to assist with self feeding Compensations: Minimize environmental distractions;Slow rate;Small sips/bites(Total A to place cup in pt's hand and bring to mouth) Postural Changes: Seated upright at 90 degrees    Other  Recommendations Oral Care Recommendations: Oral care QID   Follow up Recommendations Inpatient Rehab      Frequency and Duration min 2x/week  2 weeks       Prognosis Prognosis for Safe Diet Advancement: Good Barriers to Reach Goals: Cognitive deficits      Swallow Study   General Date of Onset: 09/21/17 HPI: [name] [name] is a 84 y.o. female with medical history significant for coronary artery disease, hypertension, left eye  blindness, and history of CVA with residual right-sided visual deficits. Presents with aphasia, confusion, and headache, CTA head and neck reveals acute M3 occlusion with associated infarct in posterior left temporal lobe with 1 mm penumbra. Pt with a witnessed seizure overnight.  Type of Study: Bedside Swallow Evaluation Previous Swallow Assessment: 03/2017 - regular with thin liquids as well as subtle delayed throat clears with thins Diet Prior to this Study: NPO Temperature Spikes Noted: No Respiratory Status: Room air History of Recent Intubation: No Behavior/Cognition: Requires cueing;Doesn't follow directions Oral Cavity Assessment: Within Functional Limits Oral Care Completed by SLP: No Oral Cavity - Dentition: Adequate natural dentition Vision: Impaired for self-feeding Self-Feeding Abilities: Total assist Patient Positioning: Upright in bed Baseline Vocal Quality: Normal Volitional Cough: Cognitively unable to elicit Volitional Swallow: Unable to elicit    Oral/Motor/Sensory Function Overall Oral Motor/Sensory Function: (unable to assess d/t cognition)   Ice Chips Ice chips: Not tested Other Comments: attempted but pt unable to open mouth with isolated trials of ice chips at lips   Thin Liquid Thin Liquid: Within functional limits Presentation: Cup Other Comments: Total hand  over hand to bring cup to mouth    Nectar Thick Nectar Thick Liquid: Not tested   Honey Thick Honey Thick Liquid: Not tested   Puree Puree: Impaired Other Comments: attempted with hand over hand, pt received bolus but unable to evaluate safety d/t cognition   Solid   GO   Solid: Not tested        [name] [name] 09/24/2017,9:55 AM

## 2017-09-24 NOTE — Progress Notes (Signed)
Physical Therapy Treatment Patient Details Name: [name] [name] MRN: [id number] DOB: 23-May-1924 Today's Date: 09/24/2017    History of Present Illness 84 y.o. female with a PMH of CAD s/p NSTEMI 2015 (medical management), HTN, SVT, CVA with subsequent left eye blindness, who presented to the ED 09/21/17 with confusion, aphasia, and vision loss, and was found to have an acute embolic stroke    PT Comments    Pt more alert today. Decreased assist required for mobility. Flat affect, nonverbal during session. Unable to follow verbal commands. Participates in mobility after initiation from therapist. +2 mod assist supine to sit. +2 min assist sit to stand and +2 min assist SPT bed to recliner. Pt in recliner with feet elevated at end of session. Chair alarm in place and son present in room.    Follow Up Recommendations  CIR     Equipment Recommendations  (TBD)    Recommendations for Other Services Rehab consult     Precautions / Restrictions Precautions Precautions: Fall    Mobility  Bed Mobility Overal bed mobility: Needs Assistance Bed Mobility: Rolling Rolling: Mod assist   Supine to sit: +2 for physical assistance;Mod assist     General bed mobility comments: Pt participating in mobility after initiated by therapist.  Transfers Overall transfer level: Needs assistance Equipment used: 2 person hand held assist Transfers: Sit to/from Stand;Stand Pivot Transfers Sit to Stand: +2 physical assistance;Min assist Stand pivot transfers: +2 physical assistance;Min assist       General transfer comment: assist to power up after initiation from therapist. Pt able to take small, pivot steps toward right for bed to recliner transfer.  Ambulation/Gait             General Gait Details: pivot steps bed to recliner. Pt weight bearing BLE symmetrically.    Stairs             Wheelchair Mobility    Modified Rankin (Stroke Patients Only) Modified Rankin (Stroke Patients  Only) Pre-Morbid Rankin Score: No symptoms Modified Rankin: Severe disability     Balance Overall balance assessment: Needs assistance Sitting-balance support: Bilateral upper extremity supported;Feet supported Sitting balance-Leahy Scale: Fair Sitting balance - Comments: min guard assist to sit EOB     Standing balance-Leahy Scale: Poor Standing balance comment: reliant on external support from therapist                            Cognition Arousal/Alertness: Awake/alert Behavior During Therapy: Flat affect Overall Cognitive Status: Difficult to assess                                 General Comments: not following commands, nonverbal      Exercises      General Comments        Pertinent Vitals/Pain Pain Assessment: Faces Pain Score: 0-No pain Faces Pain Scale: No hurt    Home Living                      Prior Function            PT Goals (current goals can now be found in the care plan section) Acute Rehab PT Goals Patient Stated Goal: none stated PT Goal Formulation: With patient/family Time For Goal Achievement: 10/07/17 Potential to Achieve Goals: Good Progress towards PT goals: Progressing toward goals    Frequency  Min 4X/week      PT Plan Current plan remains appropriate    Co-evaluation              AM-PAC PT "6 Clicks" Daily Activity  Outcome Measure  Difficulty turning over in bed (including adjusting bedclothes, sheets and blankets)?: Unable Difficulty moving from lying on back to sitting on the side of the bed? : Unable Difficulty sitting down on and standing up from a chair with arms (e.g., wheelchair, bedside commode, etc,.)?: Unable Help needed moving to and from a bed to chair (including a wheelchair)?: A Lot Help needed walking in hospital room?: A Lot Help needed climbing 3-5 steps with a railing? : Total 6 Click Score: 8    End of Session Equipment Utilized During Treatment: Gait  belt Activity Tolerance: Patient tolerated treatment well Patient left: in chair;with call bell/phone within reach;with chair alarm set;with family/visitor present Nurse Communication: Mobility status PT Visit Diagnosis: Difficulty in walking, not elsewhere classified (R26.2);Other symptoms and signs involving the nervous system (T77.116)     Time: 5790-3833 PT Time Calculation (min) (ACUTE ONLY): 19 min  Charges:  $Therapeutic Activity: 8-22 mins                    G Codes:       Lorrin Goodell, PT  Office # [phone] Pager [phone]    Lorriane Shire 09/24/2017, 10:03 AM

## 2017-09-24 NOTE — Progress Notes (Signed)
Inpatient Rehabilitation  Met with patient and son at bedside to discuss team's recommendation for IP Rehab.  Shared booklets, insurance verification letter, and answered initial questions.  Plan to follow along for timing of medical readiness, therapy tolerance, and insurance approval prior to potential IP Rehab admission. Will follow up Monday 09/27/17.  Discussed with nurse case manager.  Call if questions.    Carmelia Roller., CCC/SLP Admission Coordinator  [facility]  Cell [phone]

## 2017-09-24 NOTE — Consult Note (Signed)
Physical Medicine and Rehabilitation Consult Reason for Consult: Decreased functional mobility with aphasia Referring Physician: [facility]   HPI: [name] [name] is a 84 y.o. right-handed female with history of CAD status post NSTEMI/SVT, left eye blindness, hypertension, CVA with loop recorder with residual right side visual deficits and received inpatient rehab services January 2019 and discharged to home supervision ambulating 200 feet.  Per chart review and son, patient lives alone.  Split level home with 3 steps to entry she does have a ramp in the back of the house.  Family assistance as needed.  Presented 09/21/2017 with altered mental status, headache and aphasia as well as questionable seizure.  Troponin negative.  Cranial CT scan reviewed, unremarkable for acute intracranial process. CT angiogram of head and neck showed acute left M3 a occlusion, likely embolic.  Associated core infarct involving the left temporal occipital region.  MRI of the brain pending.  Echocardiogram with ejection fraction of 18% grade 2 diastolic dysfunction.  EEG showed some occasional focal slowing over the left temporal region.  No seizure activity.  Patient remains on [place] for seizure prophylaxis.  Currently maintained on aspirin for CVA prophylaxis.  Subcutaneous Lovenox for DVT prophylaxis.  Physical and occupational therapy evaluations completed with recommendations of physical medicine rehab consult.   Review of Systems  Unable to perform ROS: Language   Past Medical History:  Diagnosis Date  . Arthritis    "thumbs, joints" (03/23/2017  . Basal cell carcinoma    "several; scattered over my face, hands, leg some cut off; some burned off"  . Blind left eye    a. Corneal transplant x3 with rejection  . CAD (coronary artery disease)    a. s/p NSTEMI 2015 treated conservatively; nuclear stress test low risk. b. Continued angina despite med rx 07/2015 - s/p Bio Freedom Stent to ramus intermedius with mod LAD  stenosis neg by FFR. // Nuc study 5/19:  EF 67, no ischemia or scar; Low Risk   . Complication of anesthesia    "very sensitive to RX"  . Diverticulitis large intestine   . Diverticulosis   . Family history of adverse reaction to anesthesia    "we all get PONV" (1/1/201)  . Gastritis   . GERD (gastroesophageal reflux disease)   . Hayfever   . Helicobacter pylori (H. pylori) 05/22/02   RUT-Positive  . Hypertension   . Myocardial infarction ([place]) 2015  . Squamous carcinoma    "several; scattered over my face, hands, leg some cut off; some burned off"  . Stroke [facility]) 2015   denies residual on 08/07/2015  . Stroke [facility]) 03/18/2017   "has effected her vision, balance, some memory" (03/23/2017)  . SVT (supraventricular tachycardia) (HCC)    a. seen by Dr. Caryl Comes - has loop recorder in. Patient has declined amiodarone due to side effect profile  . Varicose veins    Past Surgical History:  Procedure Laterality Date  . BASAL CELL CARCINOMA EXCISION     "several; scattered over my face, hands, leg some cut off; some burned off"  . CARDIAC CATHETERIZATION N/A 08/07/2015   Procedure: Left Heart Cath and Coronary Angiography;  Surgeon: [name], MD;  Location: [place] CV LAB;  Service: Cardiovascular;  Laterality: N/A;  . CARDIAC CATHETERIZATION N/A 08/07/2015   Procedure: Coronary Stent Intervention;  Surgeon: [name], MD;  Location: [place] CV LAB;  Service: Cardiovascular;  Laterality: N/A;  . CARDIAC CATHETERIZATION N/A 08/07/2015   Procedure: Intravascular Pressure Wire/FFR  Study;  Surgeon: [name], MD;  Location: [place] CV LAB;  Service: Cardiovascular;  Laterality: N/A;  . CATARACT EXTRACTION W/ INTRAOCULAR LENS  IMPLANT, BILATERAL Bilateral 2005  . CLOSED REDUCTION SHOULDER DISLOCATION Left 2016 X 2  . CORNEAL TRANSPLANT Bilateral right 2009, left 2009    3 in left eye (last 2 failed), 1 in right eye  . DILATION AND CURETTAGE OF UTERUS    . EYE SURGERY    .  JOINT REPLACEMENT    . [place]   "had gangrene in it"  . LOOP RECORDER IMPLANT N/A 01/05/2014   Procedure: LOOP RECORDER IMPLANT;  Surgeon: Coralyn Mark, MD;  Location: [place] CATH LAB;  Service: Cardiovascular;  Laterality: N/A;  . SQUAMOUS CELL CARCINOMA EXCISION     "several; scattered over my face, hands, leg some cut off; some burned off"  . TONSILLECTOMY    . TOTAL ABDOMINAL HYSTERECTOMY  11/1983   "ovaries and all"  . TOTAL KNEE ARTHROPLASTY Left 06/27/2012   Procedure: LEFT TOTAL KNEE ARTHROPLASTY;  Surgeon: Gearlean Alf, MD;  Location: [facility];  Service: Orthopedics;  Laterality: Left;  . TOTAL KNEE ARTHROPLASTY Right 12/12/2012   Procedure: RIGHT TOTAL KNEE ARTHROPLASTY;  Surgeon: Gearlean Alf, MD;  Location: [facility] OR[facility];  Service: Orthopedics;  Laterality: Right;  . TUBAL LIGATION  1966   Family History  Problem Relation Age of Onset  . Stroke Mother   . Heart attack Father   . Heart attack Brother   . Cancer Brother   . Heart attack Brother   . Heart attack Brother   . Heart attack Brother   . Parkinsonism Brother    Social History:  reports that she has never smoked. She has never used smokeless tobacco. She reports that she does not drink alcohol or use drugs. Allergies:  Allergies  Allergen Reactions  . Contrast Media [Iodinated Diagnostic Agents] Anaphylaxis  . Fluorescein Anaphylaxis, Shortness Of Breath, Itching and Swelling  . Hydralazine Hcl Anaphylaxis, Swelling, Palpitations and Rash  . Sulfa Antibiotics Rash and Other (See Comments)    Other Reaction: red bumps  . Ultram [Tramadol] Hives  . Yellow Dye Anaphylaxis, Itching, Swelling and Other (See Comments)    Fluorescein (in eye drops)  . Buprenex [Buprenorphine Hcl] Palpitations and Other (See Comments)  . Keflex [Cephalexin] Diarrhea  . Lipitor [Atorvastatin] Other (See Comments)    Muscle weakness  . Restasis [Cyclosporine] Swelling    redness of face with slight swelling   .  Augmentin [Amoxicillin-Pot Clavulanate] Nausea And Vomiting  . Levofloxacin Other (See Comments)    Reaction not recalled  . Morphine And Related Other (See Comments)    agitation   . Percocet [Oxycodone-Acetaminophen] Nausea And Vomiting  . Tape Other (See Comments)    Adhesive tape pulls off the skin Able to tolerate paper tape  . Zetia [Ezetimibe] Other (See Comments)    Myalgias   Medications Prior to Admission  Medication Sig Dispense Refill  . Magnesium 250 MG TABS Take 250 mg by mouth at bedtime.    . Multiple Vitamin (MULTI VITAMIN DAILY PO) Take 1 tablet by mouth every morning.    . Multiple Vitamins-Minerals (PRESERVISION AREDS 2) CAPS Take 1 capsule by mouth 2 (two) times daily.     . nebivolol (BYSTOLIC) 2.5 MG tablet Take 1 tablet (2.5 mg total) by mouth 2 (two) times daily. 180 tablet 3  . nitroGLYCERIN (NITROSTAT) 0.4 MG SL tablet Place 1 tablet (0.4 mg  total) under the tongue every 5 (five) minutes as needed for chest pain. 25 tablet 2  . pantoprazole (PROTONIX) 40 MG tablet Take 40 mg by mouth daily.     Vladimir Faster Glycol-Propyl Glycol (SYSTANE ULTRA) 0.4-0.3 % SOLN Apply 1 drop to eye See admin instructions. Twice daily as needed and at bedtime for irritation    . prednisoLONE acetate (PRED FORTE) 1 % ophthalmic suspension Place 1 drop into both eyes See admin instructions. 1 drop into both eyes at bedtime spaced 5 minutes apart from Systane gel    . PRESCRIPTION MEDICATION Place 1 drop into the right eye 4 (four) times daily. [place] [id number]   Laray Anger MD  09/13/17 Serum, Autologous 50% drop    . Melatonin 1 MG SUBL Place 1 mg under the tongue at bedtime.      Home: Home Living Family/patient expects to be discharged to:: Unsure Living Arrangements: Alone Available Help at Discharge: Family, Available 24 hours/day Type of Home: House Home Access: Stairs to enter [employer] of Steps: 3 in front, ramp in back Home Layout:  Multi-level(split level) Alternate Level Stairs-Number of Steps: 5-6 Alternate Level Stairs-Rails: Right Bathroom Shower/Tub: Tub/shower unit, Walk-in shower(walk in shower on bottom level, but normally takes shower on top level in tub shower) Bathroom Toilet: Standard Home Equipment: None Additional Comments: split level home.  Functional History: Prior Function Level of Independence: Independent Comments: likes to work in garden Functional Status:  Mobility: Bed Mobility Overal bed mobility: Needs Assistance Bed Mobility: Rolling, Supine to Sit, Sit to Supine Rolling: Mod assist Supine to sit: +2 for physical assistance, Max assist Sit to supine: +2 for physical assistance, Max assist General bed mobility comments: Increased physical assist for bed mobility due to patient lethargy. Assist for rolling (hygiene and pericare performed) patient was able to flex knee with assist and performed second roll to the right side with min assist and cues. Assist to elevate trunk to upright and to return trunk Transfers Overall transfer level: Needs assistance Equipment used: 2 person hand held assist Transfers: Sit to/from Stand Sit to Stand: Min assist, +2 physical assistance, +2 safety/equipment General transfer comment: +2 min assist to power up to standing, modest initiation from therapist and patient able to carry out task performance. Ambulation/Gait General Gait Details: unable to perform despite attempts to weight shift and initiate    ADL: ADL Overall ADL's : Needs assistance/impaired Grooming: Total assistance Grooming Details (indicate cue type and reason): hand over hand assist General ADL Comments: currently total A due to arousal  Cognition: Cognition Overall Cognitive Status: Difficult to assess Orientation Level: Other (comment)(UTA/Patient nonverbal) Cognition Arousal/Alertness: Lethargic Behavior During Therapy: Flat affect Overall Cognitive Status: Difficult to  assess Difficult to assess due to: Hard of hearing/deaf, Level of arousal  Blood pressure (!) 145/53, pulse 65, temperature 98.4 F (36.9 C), temperature source Axillary, resp. rate 16, height 5\' 3"  (1.6 m), weight 75.9 kg (167 lb 5.3 oz), SpO2 97 %. Physical Exam  Vitals reviewed. Constitutional: She appears well-developed.  Frail  HENT:  Head: Normocephalic and atraumatic.  Eyes: Right eye exhibits no discharge. Left eye exhibits no discharge.  Pupils sluggish to light  Neck: Normal range of motion. Neck supple. No thyromegaly present.  Cardiovascular: Normal rate and regular rhythm.  Respiratory: Effort normal and breath sounds normal. No respiratory distress.  GI: Soft. Bowel sounds are normal. She exhibits no distension.  Musculoskeletal:  No edema or tenderness in extremities  Neurological:  Lethargic  Poor sitting balance She remained nonverbal throughout exam.   Not following commands Limited spontaneous movement of upper extremities.  Skin: Skin is warm and dry.  Psychiatric:  Unable to assess due to mentation    Results for orders placed or performed during the hospital encounter of 09/21/17 (from the past 24 hour(s))  Basic metabolic panel     Status: Abnormal   Collection Time: 09/23/17  7:21 AM  Result Value Ref Range   Sodium 138 135 - 145 mmol/L   Potassium 3.3 (L) 3.5 - 5.1 mmol/L   Chloride 103 98 - 111 mmol/L   CO2 25 22 - 32 mmol/L   Glucose, Bld 89 70 - 99 mg/dL   BUN 13 8 - 23 mg/dL   Creatinine, Ser 0.72 0.44 - 1.00 mg/dL   Calcium 8.4 (L) 8.9 - 10.3 mg/dL   GFR calc non Af Amer >60 >60 mL/min   GFR calc Af Amer >60 >60 mL/min   Anion gap 10 5 - 15   No results found.  Assessment/Plan: Diagnosis: left CVA Labs and images (see above) independently reviewed.  Records reviewed and summated above. Stroke: Continue secondary stroke prophylaxis and Risk Factor Modification listed below:   Antiplatelet therapy:   Blood Pressure Management:   Continue current medication with prn's with permisive HTN per primary team Statin Agent:   ?hemiparesis: Motor recovery: Fluoxetine  1. Does the need for close, 24 hr/day medical supervision in concert with the patient's rehab needs make it unreasonable for this patient to be served in a less intensive setting? Yes  2. Co-Morbidities requiring supervision/potential complications: diastolic dysfunction (monitor for signs/symptoms of fluid overload), CAD status post NSTEMI/SVT (cont meds), left eye blindness, HTN (monitor and provide prns in accordance with increased physical exertion and pain), history of CVA with loop recorder with residual right side visual deficits, hypokalemia (continue to monitor and replete as necessary)  3. Due to safety, disease management and patient education, does the patient require 24 hr/day rehab nursing? Yes 4. Does the patient require coordinated care of a physician, rehab nurse, PT (1-2 hrs/day, 5 days/week), OT (1-2 hrs/day, 5 days/week) and SLP (1-2 hrs/day, 5 days/week) to address physical and functional deficits in the context of the above medical diagnosis(es)? Yes Addressing deficits in the following areas: balance, endurance, locomotion, strength, transferring, bowel/bladder control, bathing, dressing, feeding, grooming, toileting, cognition, speech, language, swallowing and psychosocial support 5. Can the patient actively participate in an intensive therapy program of at least 3 hrs of therapy per day at least 5 days per week? No 6. The potential for patient to make measurable gains while on inpatient rehab is excellent 7. Anticipated functional outcomes upon discharge from inpatient rehab are min assist  with PT, min assist with OT, mod assist and max assist with SLP. 8. Estimated rehab length of stay to reach the above functional goals is: 20-25 days. 9. Anticipated D/C setting: Other 10. Anticipated post D/C treatments: SNF 11. Overall Rehab/Functional  Prognosis: good and fair  RECOMMENDATIONS: This patient's condition is appropriate for continued rehabilitative care in the following setting: Will await completion of medical workup and monitor for improvement in ability to participate in therapies.  At this point, pt limited in meaningful interaction and ability to carry over information.  Will cont to follow for progress. Patient has agreed to participate in recommended program. Potentially Note that insurance prior authorization may be required for reimbursement for recommended care.  Comment: Rehab Admissions Coordinator to follow up.  I have personally performed a face to face diagnostic evaluation, including, but not limited to relevant history and physical exam findings, of this patient and developed relevant assessment and plan.  Additionally, I have reviewed and concur with the physician assistant's documentation above.   Delice Lesch, MD, ABPMR Lavon Paganini Angiulli, PA-C 09/24/2017

## 2017-09-24 NOTE — Plan of Care (Signed)
Patient stable, discussed POC with patient, denies question/concerns at this time.  

## 2017-09-24 NOTE — Progress Notes (Addendum)
STROKE TEAM PROGRESS NOTE   INTERVAL HISTORY Her son is at the bedside.  Pt up in the chair, awake, alert. eyes closed. Not following verbal commands. Did have auto responses when working with therapy per son. Passed swallow, on thin liquids. IM changing keppra to vimpat given lethargic state, could be related, though she did receive both ativan and fentanyl and is sensitive to meds per son. Currently, cared for by dtr at home. Family is discussing post-hospital plans. CIR to see.   Vitals:   09/24/17 0532 09/24/17 0820 09/24/17 1132 09/24/17 1200  BP: (!) 145/53 (!) 162/85  109/72  Pulse: 65 78 74   Resp: 16 20 20    Temp: 98.4 F (36.9 C) 98.6 F (37 C) 97.8 F (36.6 C)   TempSrc: Axillary Axillary Axillary   SpO2: 97% 97% 100%   Weight:      Height:        CBC:  Recent Labs  Lab 09/21/17 1425 09/21/17 1440  WBC 5.3  --   NEUTROABS 4.2  --   HGB 14.5 15.0  HCT 46.0 44.0  MCV 96.2  --   PLT 197  --     Basic Metabolic Panel:  Recent Labs  Lab 09/21/17 1425 09/21/17 1440 09/23/17 0721  NA 142 139 138  K 4.2 4.1 3.3*  CL 105 102 103  CO2 26  --  25  GLUCOSE 125* 121* 89  BUN 11 13 13   CREATININE 0.65 0.60 0.72  CALCIUM 9.0  --  8.4*   Lipid Panel:     Component Value Date/Time   CHOL 164 09/22/2017 0441   TRIG 54 09/22/2017 0441   HDL 58 09/22/2017 0441   CHOLHDL 2.8 09/22/2017 0441   VLDL 11 09/22/2017 0441   LDLCALC 95 09/22/2017 0441   HgbA1c:  Lab Results  Component Value Date   HGBA1C 5.6 09/22/2017   Urine Drug Screen:     Component Value Date/Time   LABOPIA NONE DETECTED 09/21/2017 1533   COCAINSCRNUR NONE DETECTED 09/21/2017 1533   LABBENZ NONE DETECTED 09/21/2017 1533   AMPHETMU NONE DETECTED 09/21/2017 1533   THCU NONE DETECTED 09/21/2017 1533   LABBARB (A) 09/21/2017 1533    Result not available. Reagent lot number recalled by manufacturer.    Alcohol Level     Component Value Date/Time   ETH <10 09/21/2017 1425    IMAGING No  results found.  2D echocardiogram - Left ventricle: The cavity size was normal. Wall thickness was normal. Systolic function was normal. The estimated ejection fraction was in the range of 60% to 65%. Wall motion was normal; there were no regional wall motion abnormalities. Features are consistent with a pseudonormal left ventricular filling pattern, with concomitant abnormal relaxation and increased filling pressure (grade 2 diastolic dysfunction). - Aortic valve: Mildly calcified annulus. Trileaflet; mildly calcified leaflets. There was mild regurgitation. - Mitral valve: There was trivial regurgitation. - Left atrium: The atrium was at the upper limits of normal in size. - Right atrium: Central venous pressure (est): 3 mm Hg. - Atrial septum: No defect or patent foramen ovale was identified. - Tricuspid valve: There was mild regurgitation. - Pulmonary arteries: PA peak pressure: 30 mm Hg (S). - Pericardium, extracardiac: There was no pericardial effusion.  EEG  Impression: This EEG is abnormal due to the presence of: 1. Moderate diffuse background slowing 2. Occasional focal slowing over the left temporal region Clinical Correlation of the above findings indicates diffuse cerebral dysfunction that is non-specific  in etiology and can be seen with hypoxic/ischemic injury, toxic/metabolic encephalopathies, neurodegenerative disorders, or medication effect. Focal slowing over the left temporal region indicates focal cerebral dysfunction in this region suggestive of underlying structural or physiologic abnormality. The absence of epileptiform discharges does not rule out a clinical diagnosis of epilepsy.  Clinical correlation is advised.  MRI pending - not able to perform due to noncooperation. Canceled. Will not change treatment.   PHYSICAL EXAM HEENT-  Normocephalic, no lesions, without obvious abnormality.  Normal external eye and conjunctiva.   Extremities- Warm, dry and  intact Musculoskeletal-no joint tenderness, deformity or swelling Skin-warm and dry, no hyperpigmentation, vitiligo, or suspicious lesions Chest CTA, HRR  Neurological Examination Mental Status: Patient is awake, alert. Limited speech "okay" "thank you". Does not follow commands. Fidgety.  Cranial Nerves: II: L eye closed, will spontaneously open R. blind left eye with cloudy cornea, done not blink to threat on the L or on the right.  III,IV, VI: Doll's appear intact on the right eye V,VII: mild R facial droop  Motor: Moves all extremities equally, no weakness noted (unable to cooperate with exam) Sensory: withdraws to pain Deep Tendon Reflexes: 1+ in the upper extremities no knee jerk and no ankle jerk Plantars: Right: downgoing                                Left: downgoing Cerebellar: Not able to test Gait: Not tested   ASSESSMENT/PLAN [name] is a 84 y.o. female with history of hypertension, SVT, CHF, CAD, legally blind left eye, prior strokes on Plavix with loop recorder that has shown 10 seconds of A. fib in past presenting with global aphasia and right homonymous hemianopsia.   Stroke:  left temporal infarct felt to be embolic secondary to atrial fibrillation   Resultant - global aphasia, right eye cortical blindness (acute right hemianopia and chronic left hemianopia), left eye chronic blindness  CT head no acute findings.  Interval right occipital stroke past 6 months.  CTA head & neck with perfusion  Acute L M3 occlusion. Associated left temporal parietal infarct with surrounding penumbra.  Intracranial atherosclerosis are M1 and the PCAs.  Carotid atherosclerosis.  Tortuosity of vessels in the neck  MRI  - not cooperative - canceled   2D Echo  EF 60-65%. No source of embolus   LDL 95  HgbA1c 5.6  Lovenox 40 mg subcu daily for VTE prophylaxis  Diet clear liquids  clopidogrel 75 mg daily prior to admission, now on aspirin suppository (will keep  suppository as SLP recommends to keeps meds via alternative routes at this time). Long term recommendation for anticoagulation with Eliquis 5-7 days post stroke (09/29/17)  Therapy recommendations:  CIR. May need to consider SNF. Family considering options based on pt progression  Disposition:  pending   Seizure   New onset during the night 7/3  seizure felt to be secondary to current stroke given temporal location  treated with Ativan and Keppra   keppra changed to Vimpat given to see if it improves lethargy  EEG diffuse slow with occasional left temporal focal slowing, no seizure activity  ABG normal (lethargy not respiratory related)  Atrial fibrillation  Loop recorder has shown 10 seconds of atrial fibrillation in 01/2015.    Most recent reading in June automatic reading showed atrial fibrillation, physician determined PACs   Reported atrial fibrillation with RVR/SVT associated with seizure activity   loop recorder interrogation -  unable to link to loop recorder. Anticipate it may be at end-of-life as it was placed in the Fall of 2015. Last interrogated in May 2019  Consider Eliquis in 5-7 days (09/29/17) post stroke given moderate size   Hypertension  Variable but within limits . Permissive hypertension (OK if < 220/120) but gradually normalize in 5-7 days . Long-term BP goal normotensive  Hyperlipidemia  Home meds: No statin  LDL 95, goal < 70  On lipitor 20  Continue statin at discharge  Other Stroke Risk Factors  Advanced age  Hx stroke/TIA  12/2013-small L frontal cortical infarct (R arm weakness).  Embolic.  Etiology unclear.  R PCA moderate stenosis.  Loop recorder placed.  01/2015-loop recorder shows 10 seconds atrial fibrillation, SVT.  Not placed on anticoagulation  02/2017-right midbrain infarct (L facial droop and left lip numbness) secondary to small vessel disease.  CTA head and neck showed a right PE ICA occlusion and left P2 stenosis.  Old  left frontal and bilateral parietal infarcts.  Placed on DAPT to Plavix alone.  Since 02/2017-interval development of left homonymous hemianopia.  CT now shows old not present right PCA infarct in December MRI  Family hx stroke (mother)  Coronary artery disease - MI, stent  Hospital day # [place], MSN, APRN, ANVP-BC, AGPCNP-BC Advanced Practice Stroke Nurse [place] for Schedule & Pager information 09/24/2017 2:00 PM  To contact Stroke Continuity provider, please refer to [email or web address] After hours, contact General Neurology    ATTENDING NOTE: I reviewed above note and agree with the assessment and plan. I have made any additions or clarifications directly to the above note. Pt was seen and examined.   Pt improved neurologically. Much calmer than before. No agitation but still drowsy sleepy. As per son, she worked with PT/OT today able to sit in chair for a while. Still has global aphasia and cortical blindness. PT/OT recommend CIR. Currently passed swallow on clear liquid diet. On ASA and pravastatin now (side effect of weakness from lipitor in the past). Due to afib, recommend to start anticoagulation with eliquis 5-7 days post stroke (07/30/17).   Pt still drowsy, concerning for side effects from keppra. Agree to switch keppra to vimpat. Discussed with Dr. Sloan Leiter.   Rosalin Hawking, MD PhD Stroke Neurology 09/24/2017 5:09 PM

## 2017-09-25 DIAGNOSIS — I2510 Atherosclerotic heart disease of native coronary artery without angina pectoris: Secondary | ICD-10-CM

## 2017-09-25 LAB — BASIC METABOLIC PANEL
ANION GAP: 12 (ref 5–15)
BUN: 15 mg/dL (ref 8–23)
CALCIUM: 8.6 mg/dL — AB (ref 8.9–10.3)
CO2: 24 mmol/L (ref 22–32)
Chloride: 103 mmol/L (ref 98–111)
Creatinine, Ser: 0.66 mg/dL (ref 0.44–1.00)
GLUCOSE: 175 mg/dL — AB (ref 70–99)
Potassium: 2.8 mmol/L — ABNORMAL LOW (ref 3.5–5.1)
Sodium: 139 mmol/L (ref 135–145)

## 2017-09-25 LAB — MAGNESIUM: MAGNESIUM: 1.9 mg/dL (ref 1.7–2.4)

## 2017-09-25 MED ORDER — HALOPERIDOL LACTATE 5 MG/ML IJ SOLN
2.0000 mg | Freq: Four times a day (QID) | INTRAMUSCULAR | Status: DC | PRN
  Administered 2017-09-25 (×2): 2 mg via INTRAVENOUS
  Filled 2017-09-25 (×2): qty 1

## 2017-09-25 MED ORDER — SODIUM CHLORIDE 0.9 % IV SOLN
INTRAVENOUS | Status: DC
  Administered 2017-09-25: 12:00:00 via INTRAVENOUS
  Filled 2017-09-25 (×2): qty 1000

## 2017-09-25 MED ORDER — HALOPERIDOL LACTATE 5 MG/ML IJ SOLN
5.0000 mg | Freq: Once | INTRAMUSCULAR | Status: DC
  Filled 2017-09-25: qty 1

## 2017-09-25 MED ORDER — POTASSIUM CHLORIDE 10 MEQ/100ML IV SOLN: 10.0000 meq | INTRAVENOUS | Status: DC

## 2017-09-25 MED ORDER — MAGNESIUM SULFATE IN D5W 1-5 GM/100ML-% IV SOLN
1.0000 g | Freq: Once | INTRAVENOUS | Status: AC
  Administered 2017-09-25: 1 g via INTRAVENOUS
  Filled 2017-09-25: qty 100

## 2017-09-25 MED ORDER — LORAZEPAM 2 MG/ML IJ SOLN: 0.5000 mg | Freq: Four times a day (QID) | INTRAMUSCULAR | Status: DC | PRN

## 2017-09-25 MED ORDER — SODIUM CHLORIDE 0.9 % IV SOLN
INTRAVENOUS | Status: AC
  Administered 2017-09-25 (×2): via INTRAVENOUS
  Filled 2017-09-25: qty 1000

## 2017-09-25 MED ORDER — DILTIAZEM HCL 60 MG PO TABS
60.0000 mg | ORAL_TABLET | Freq: Four times a day (QID) | ORAL | Status: DC
  Administered 2017-09-25: 60 mg via ORAL
  Filled 2017-09-25 (×2): qty 1

## 2017-09-25 MED ORDER — POTASSIUM CHLORIDE CRYS ER 20 MEQ PO TBCR
40.0000 meq | EXTENDED_RELEASE_TABLET | Freq: Once | ORAL | Status: AC
  Administered 2017-09-25: 40 meq via ORAL
  Filled 2017-09-25: qty 2

## 2017-09-25 MED ORDER — SODIUM CHLORIDE 0.9 % IV SOLN: INTRAVENOUS | Status: DC

## 2017-09-25 MED ORDER — HALOPERIDOL LACTATE 5 MG/ML IJ SOLN
5.0000 mg | Freq: Once | INTRAMUSCULAR | Status: AC
  Administered 2017-09-25: 5 mg via INTRAMUSCULAR
  Filled 2017-09-25: qty 1

## 2017-09-25 MED ORDER — DILTIAZEM HCL 30 MG PO TABS
60.0000 mg | ORAL_TABLET | Freq: Four times a day (QID) | ORAL | Status: DC
  Administered 2017-09-25 – 2017-09-30 (×18): 60 mg via ORAL
  Filled 2017-09-25 (×2): qty 2
  Filled 2017-09-25: qty 1
  Filled 2017-09-25: qty 2
  Filled 2017-09-25: qty 1
  Filled 2017-09-25 (×2): qty 2
  Filled 2017-09-25: qty 1
  Filled 2017-09-25 (×9): qty 2
  Filled 2017-09-25 (×2): qty 1
  Filled 2017-09-25: qty 2
  Filled 2017-09-25: qty 1
  Filled 2017-09-25: qty 2

## 2017-09-25 NOTE — Progress Notes (Addendum)
STROKE TEAM PROGRESS NOTE   HISTORY [name] [name] is an 84 y.o. female with history of hypertension, SVT, CHF, CAD, left eye legally blind and strokes on Plavix with residue left hemianopia who was last seen normal at 9 PM last night.  She lives by herself.  She is blind in her left eye. And previous stroke left her with a left hemianopsia.  From what the family says they noted that this morning she had gotten up and it looks like she made breakfast however no one had spoken to her.  When family came to see her today they noted that she is very confused and did not understand what they were saying. Her words also did not make sense. Family also noted that the scene at home seemed that pt had difficulty with vision as there were evidence that she could not see things and knocked down picture frames and other stuff on the floor. They brought her to the hospital.  CT head no acute abnormality but chronic right PCA infarct.  On exam, pt was aphasic not able to understand commands or express herself.  In addition I could not get her to blink to threat on the right or left.  Due to this and the fact that she was still within the window for perfusion scan it was explained to the family that she would need a CTA head and neck and perfusion to see if any of the tissue is savable.  Pt was admitted 02/2018 for left facial droop and left lip numbness. CTA head and neck right PCA occlusion and left P2 stenosis. 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 LDL 118, A1c 5.3. EF 55-60%.Given stroke location and intracranial vascular stenosis, recommend DAPT for 3 weeks and then Plavix alone. Continue Lipitor 40. PT/OT recommend CIR.  She had stroke in 12/2013 with right arm weakness. MRI showed left high frontal cortex infarct. MRA showed right PCA moderate stenosis. Carotid Doppler, TTE, DVT negative. LDL 91 elevated A1c 6.0. Loop recorder  placed. In 01/2015, she was found to have A. fib lasting 10 seconds, however frequent intermittent SVTs. She follows Dr. Burt Knack for SVT management, on bystolic. The most recent loop recorder interrogation showed 4AF episodes (<1% burden) - available EGMs show PACs.    Date last known well: Date: 09/20/2017 Time last known well: Time: 21:00 tPA Given: No: Out of the window Modified Rankin: Rankin Score=0      INTERVAL HISTORY Pt up in the chair, awake, alert. eyes closed.  following some commands like squeezing hands but not smiling  Vitals:   09/25/17 0350 09/25/17 0907 09/25/17 0943 09/25/17 1254  BP: (!) 125/49 (!) 148/58 (!) 154/62 (!) 163/73  Pulse: 73 66  85  Resp: (!) 21  20   Temp:  97.8 F (36.6 C)  98.2 F (36.8 C)  TempSrc:  Oral  Axillary  SpO2: 92% 97%  95%  Weight:      Height:        CBC:  Recent Labs  Lab 09/21/17 1425 09/21/17 1440  WBC 5.3  --   NEUTROABS 4.2  --   HGB 14.5 15.0  HCT 46.0 44.0  MCV 96.2  --   PLT 197  --     Basic Metabolic Panel:  Recent Labs  Lab 09/23/17 0721 09/25/17 0449 09/25/17 0843  NA 138 139  --   K 3.3* 2.8*  --   CL 103 103  --  CO2 25 24  --   GLUCOSE 89 175*  --   BUN 13 15  --   CREATININE 0.72 0.66  --   CALCIUM 8.4* 8.6*  --   MG  --   --  1.9   Lipid Panel:     Component Value Date/Time   CHOL 164 09/22/2017 0441   TRIG 54 09/22/2017 0441   HDL 58 09/22/2017 0441   CHOLHDL 2.8 09/22/2017 0441   VLDL 11 09/22/2017 0441   LDLCALC 95 09/22/2017 0441   HgbA1c:  Lab Results  Component Value Date   HGBA1C 5.6 09/22/2017   Urine Drug Screen:     Component Value Date/Time   LABOPIA NONE DETECTED 09/21/2017 1533   COCAINSCRNUR NONE DETECTED 09/21/2017 1533   LABBENZ NONE DETECTED 09/21/2017 1533   AMPHETMU NONE DETECTED 09/21/2017 1533   THCU NONE DETECTED 09/21/2017 1533   LABBARB (A) 09/21/2017 1533    Result not available. Reagent lot number recalled by manufacturer.    Alcohol  Level     Component Value Date/Time   ETH <10 09/21/2017 1425    IMAGING  Ct Angio Head W Or Wo Contrast Ct Angio Neck W Or Wo Contrast Ct Cerebral Perfusion W Contrast 09/21/2017 IMPRESSION:  1. Acute left M3 occlusion, likely embolic. Associated core infarct involving the left temporoccipital region with relatively small volume 11 mL surrounding ischemic penumbra.  2. Atheromatous change involving the intracranial circulation, most notable at the right M1 segment and bilateral PCAs. No hemodynamically significant or correctable stenosis identified.  3. Mild for age atherosclerotic change about the carotid bifurcations without high-grade stenosis.  4. Diffuse tortuosity of the major arterial vasculature of the neck, suggesting chronic underlying hypertension.     Ct Head Wo Contrast 09/21/2017 IMPRESSION:  1. No acute intracranial findings.  2. Interval development of a large area of encephalomalacia in the right occipital lobe consistent with a stroke sustained over the last 6 months.      2D echocardiogram - Left ventricle: The cavity size was normal. Wall thickness was normal. Systolic function was normal. The estimated ejection fraction was in the range of 60% to 65%. Wall motion was normal; there were no regional wall motion abnormalities. Features are consistent with a pseudonormal left ventricular filling pattern, with concomitant abnormal relaxation and increased filling pressure (grade 2 diastolic dysfunction). - Aortic valve: Mildly calcified annulus. Trileaflet; mildly calcified leaflets. There was mild regurgitation. - Mitral valve: There was trivial regurgitation. - Left atrium: The atrium was at the upper limits of normal in size. - Right atrium: Central venous pressure (est): 3 mm Hg. - Atrial septum: No defect or patent foramen ovale was identified. - Tricuspid valve: There was mild regurgitation. - Pulmonary arteries: PA peak pressure: 30 mm Hg (S). - Pericardium,  extracardiac: There was no pericardial effusion.  EEG  Impression: This EEG is abnormal due to the presence of: 1. Moderate diffuse background slowing 2. Occasional focal slowing over the left temporal region Clinical Correlation of the above findings indicates diffuse cerebral dysfunction that is non-specific in etiology and can be seen with hypoxic/ischemic injury, toxic/metabolic encephalopathies, neurodegenerative disorders, or medication effect. Focal slowing over the left temporal region indicates focal cerebral dysfunction in this region suggestive of underlying structural or physiologic abnormality. The absence of epileptiform discharges does not rule out a clinical diagnosis of epilepsy.  Clinical correlation is advised.  MRI pending - not able to perform due to noncooperation. Canceled. Will not change treatment.  PHYSICAL EXAM HEENT-  Normocephalic, no lesions, without obvious abnormality.  Normal external eye and conjunctiva.   Extremities- Warm, dry and intact Musculoskeletal-no joint tenderness, deformity or swelling Skin-warm and dry, no hyperpigmentation, vitiligo, or suspicious lesions Chest CTA, HRR  Neurological Examination Mental Status: Patient is awake, alert. Following limited commands only due to aphasia, squeezed both hands but did not smile Cranial Nerves: II: L eye closed, will spontaneously open R. blind left eye with cloudy cornea, done not blink to threat on the L or on the right.  III,IV, VI: Doll's appear intact on the right eye V,VII: mild R facial droop  Motor: Moves all extremities equally, no weakness noted  Sensory: withdrws all 4 extremities Deep Tendon Reflexes: 1+ in the upper extremities no knee jerk and no ankle jerk Plantars: Right: downgoing                                Left: downgoing Cerebellar: Not able to test Gait: Not tested   ASSESSMENT/PLAN Ms. Zanna Hawn is a 84 y.o. female with history of hypertension, SVT, CHF, CAD,  legally blind left eye, prior strokes on Plavix with loop recorder that has shown 10 seconds of A. fib in past presenting with global aphasia and right homonymous hemianopsia.   Stroke:  left temporal infarct felt to be embolic secondary to atrial fibrillation   Resultant - global aphasia, right eye cortical blindness (acute right hemianopia and chronic left hemianopia), left eye chronic blindness  CT head no acute findings.  Interval right occipital stroke past 6 months.  CTA head & neck with perfusion  Acute L M3 occlusion. Associated left temporal parietal infarct with surrounding penumbra.  Intracranial atherosclerosis are M1 and the PCAs.  Carotid atherosclerosis.  Tortuosity of vessels in the neck  MRI  - not cooperative - canceled   2D Echo  EF 60-65%. No source of embolus   LDL 95  HgbA1c 5.6  Lovenox 40 mg subcu daily for VTE prophylaxis  Diet clear liquids  clopidogrel 75 mg daily prior to admission, now on aspirin suppository (will keep suppository as SLP recommends to keeps meds via alternative routes at this time). Long term recommendation for anticoagulation with Eliquis 5-7 days post stroke (09/29/17)  Therapy recommendations:  CIR. May need to consider SNF. Family considering options based on pt progression  Disposition:  pending   Seizure   New onset during the night 7/3  seizure felt to be secondary to current stroke given temporal location  treated with Ativan and Keppra   Keppra changed to Vimpat given to see if it improves lethargy  EEG diffuse slow with occasional left temporal focal slowing, no seizure activity  ABG normal (lethargy not respiratory related)  Atrial fibrillation  Loop recorder has shown 10 seconds of atrial fibrillation in 01/2015.    Most recent reading in June automatic reading showed atrial fibrillation, physician determined PACs   Reported atrial fibrillation with RVR/SVT associated with seizure activity   loop recorder  interrogation - unable to link to loop recorder. Anticipate it may be at end-of-life as it was placed in the Fall of 2015. Last interrogated in May 2019  Start Eliquis in 5-7 days (09/29/17) post stroke given moderate size   Hypertension  Variable but within limits . Permissive hypertension (OK if < 220/120) but gradually normalize in 5-7 days . Long-term BP goal normotensive  Hyperlipidemia  Home meds: No statin  LDL  95, goal < 70  On lipitor 20  Continue statin at discharge  Other Stroke Risk Factors  Advanced age  Hx stroke/TIA  12/2013-small L frontal cortical infarct (R arm weakness).  Embolic.  Etiology unclear.  R PCA moderate stenosis.  Loop recorder placed.  01/2015-loop recorder shows 10 seconds atrial fibrillation, SVT.  Not placed on anticoagulation  02/2017-right midbrain infarct (L facial droop and left lip numbness) secondary to small vessel disease.  CTA head and neck showed a right PE ICA occlusion and left P2 stenosis.  Old left frontal and bilateral parietal infarcts.  Placed on DAPT to Plavix alone.  Since 02/2017-interval development of left homonymous hemianopia.  CT now shows old not present right PCA infarct in December MRI  Family hx stroke (mother)  Coronary artery disease - MI, stent    ATTENDING NOTE: I reviewed above note and agree with the assessment and plan. I have made any additions or clarifications directly to the above note. Pt was seen and examined.   Pt improved neurologically. Now following commands and speaking. Due to afib, recommend to start anticoagulation with eliquis 5-7 days post stroke (07/30/17).   Pt still drowsiness slightly improved after switching keppra to vimpat.   Please call neurology with any questions

## 2017-09-25 NOTE — Progress Notes (Signed)
Patient UTA follow commands, nurse unable to complete this evening assessment. She is very agitated. Patient received Haldol to help with agitation. Will continue to monitor.

## 2017-09-25 NOTE — Progress Notes (Signed)
SLP Cancellation Note  Patient Details Name: Justa Hatchell MRN: [id number] DOB: [birth year]   Cancelled treatment:       Reason Eval/Treat Not Completed: Pain limiting ability to participate. Will continue efforts.  Deneise Lever, [place], [place] Speech-Language Pathologist [phone]    Aliene Altes 09/25/2017, 10:38 AM

## 2017-09-25 NOTE — Progress Notes (Signed)
Social visit:  Patient well-known to me from the outpatient setting.  Spoke with her family at length today at the bedside.  Appreciate the care of Dr. Candiss Norse and the consultants who are seeing her.  [name] [name] 09/25/2017 2:19 PM

## 2017-09-25 NOTE — Progress Notes (Addendum)
PROGRESS NOTE        PATIENT DETAILS Name: [name] [name] Age: 84 y.o. Sex: female Date of Birth: Mar 19, 1925 Admit Date: 09/21/2017 Admitting Physician Vianne Bulls, MD IRJ:JOACZ, Butch Penny, MD  Brief Narrative: Patient is a 84 y.o. female with history of left eye blindness (corneal scarring), prior history of CVA on Plavix (loop recorder implanted-SVT detected as outpatient), presented with confusion, visual disturbances, sensory aphasia.  CT angiogram brain and CT perfusion study confirmed a left M3 occlusion with acute infarct involving the temporal cortical area.  She was admitted to the hospitalist service, but then developed generalized tonic-clonic seizure and A. fib with RVR.  She was started on IV Keppra, she spontaneously converted back to sinus rhythm.  Hospital course was complicated by delirium-and excessive sedation after using benzodiazepines (had seizure) and narcotics.  See below for further details  Subjective:  Somnolent in bed, unable to answer questions or follow commands.  Family friend bedside.  Assessment/Plan:  Acute CVA: Thought to be embolic-atrial fibrillation confirmed on EKG and telemetry monitoring this admission.  CT angiogram of the neck did not show any stenosis, 2D echocardiogram with preserved EF and without any embolic source.  LDL 95, A1c 5.6.  She unfortunately she was already blind in her left eye from corneal scarring, she now appears to have significant amount of right eye cortical blindness and motor aphasia from acute CVA.  She is able to move all 4 extremities.  Currently on aspirin, and high intensity statin, as per neurology start Eliquis on 09/29/2017 and continue with high intensity statin.  Acute metabolic encephalopathy: Initially felt to be from postictal state and Ativan that she received yesterday morning.  She became very delirious on 7/4 night and received fentanyl.  This a.m.-she is much better-,-following some  commands.  EEG on 7/3 negative for seizures, ABG without hypercarbia.  Will attempt to minimize benzodiazepines and Ativan.    PAF with [place] vas 2 score of 7: Maintaining sinus rhythm since yesterday-remains on low-dose IV Lopressor for rate control.  We will switch to Bystolic at the next 1 to 2 days, start Eliquis on 09/29/2017.  History of paroxysmal SVT: Documented on loop recorder data (loop recorder implanted after CVA)-continue telemetry monitoring.  Remains on beta-blockers  Generalized tonic-clonic seizure: Occurred on 7/3-after she was admitted-usually started on Keppra-she still is somewhat sedated this morning-I have switched over to Vimpat. EEG on 7/3 did not show seizure-like activity.  Neurology following.   Hypertension: Allow some amount of permissive hypertension-tolerating low-dose IV Lopressor to control her heart rate.  CAD: Currently without any anginal symptoms.  Left eye blindness: She has corneal scarring-unfortunately she may have developed significant visual deficits in her right side from this acute CVA.  Advanced directive/palliative care: Full code for now-the long discussion with daughter and son on 7/4 and 7/5.  Family is still in consultation of whether to change her to a DNR status.  They however would like to see how she does with rehab services before making any firm decisions.  Family aware that she probably will have some amount of significant visual, speech-and perhaps some amount of cognitive dysfunction as a result of acute CVA.  Her prognosis is guarded at this time  Delirium/encephalopathy.  Encephalopathy due to condition of CVA, blindness and unfamiliar setting, minimize narcotics and benzodiazepines, low-dose Haldol as needed ordered.  Hypokalemia.  Replaced IV.    DVT Prophylaxis: Prophylactic Lovenox   Code Status: Full code  Family Communication: Family friend at bedside  Disposition Plan: Remain inpatient-CIR or SNF on  discharge  Antimicrobial agents: Anti-infectives (From admission, onward)   None     Procedures: None  CONSULTS:  neurology  Time spent: 25 minutes-Greater than 50% of this time was spent in counseling, explanation of diagnosis, planning of further management, and coordination of care.  MEDICATIONS: Scheduled Meds: . artificial tears  1 application Both Eyes QHS  . aspirin EC  325 mg Oral Daily  . diltiazem  60 mg Oral Q6H  . enoxaparin (LOVENOX) injection  40 mg Subcutaneous Q24H  . magnesium oxide  200 mg Oral QHS  . multivitamin  1 tablet Oral QPC breakfast  . nebivolol  2.5 mg Oral BID  . ondansetron (ZOFRAN) IV  4 mg Intravenous Once  . pravastatin  20 mg Oral q1800  . prednisoLONE acetate  1 drop Both Eyes QHS   Continuous Infusions: . famotidine (PEPCID) IV Stopped (09/24/17 2336)  . lacosamide (VIMPAT) IV Stopped (09/25/17 1004)  . 0.9 % sodium chloride with kcl 100 mL/hr at 09/25/17 1201   PRN Meds:.acetaminophen **OR** acetaminophen (TYLENOL) oral liquid 160 mg/5 mL **OR** acetaminophen, ondansetron (ZOFRAN) IV, polyvinyl alcohol   PHYSICAL EXAM: Vital signs: Vitals:   09/25/17 0347 09/25/17 0350 09/25/17 0907 09/25/17 0943  BP: (!) 125/49 (!) 125/49 (!) 148/58 (!) 154/62  Pulse: 83 73 66   Resp: 18 (!) 21  20  Temp: 97.9 F (36.6 C)  97.8 F (36.6 C)   TempSrc: Axillary  Oral   SpO2: 94% 92% 97%   Weight:      Height:       Filed Weights   09/21/17 2055  Weight: 75.9 kg (167 lb 5.3 oz)   Body mass index is 29.64 kg/m.   Exam   Somnolent and tired, unable to follow commands, got agitated last night and did not sleep, cooperative neuro exam [place].AT,PERRAL Supple Neck,No JVD, No cervical lymphadenopathy appriciated.  Symmetrical Chest wall movement, Good air movement bilaterally, CTAB RRR,No Gallops, Rubs or new Murmurs, No Parasternal Heave +ve B.Sounds, Abd Soft, No tenderness, No organomegaly appriciated, No rebound - guarding or  rigidity. No Cyanosis, Clubbing or edema, No new Rash or bruise  I have personally reviewed following labs and imaging studies  LABORATORY DATA: CBC: Recent Labs  Lab 09/21/17 1425 09/21/17 1440  WBC 5.3  --   NEUTROABS 4.2  --   HGB 14.5 15.0  HCT 46.0 44.0  MCV 96.2  --   PLT 197  --     Basic Metabolic Panel: Recent Labs  Lab 09/21/17 1425 09/21/17 1440 09/23/17 0721 09/25/17 0449 09/25/17 0843  NA 142 139 138 139  --   K 4.2 4.1 3.3* 2.8*  --   CL 105 102 103 103  --   CO2 26  --  25 24  --   GLUCOSE 125* 121* 89 175*  --   BUN 11 13 13 15   --   CREATININE 0.65 0.60 0.72 0.66  --   CALCIUM 9.0  --  8.4* 8.6*  --   MG  --   --   --   --  1.9    GFR: Estimated Creatinine Clearance: 45.6 mL/min (by C-G formula based on SCr of 0.66 mg/dL).  Liver Function Tests: Recent Labs  Lab 09/21/17 1425  AST 31  ALT 25  ALKPHOS 94  BILITOT 0.7  PROT 6.5  ALBUMIN 3.9   No results for input(s): LIPASE, AMYLASE in the last 168 hours. No results for input(s): AMMONIA in the last 168 hours.  Coagulation Profile: Recent Labs  Lab 09/21/17 1425  INR 0.96    Cardiac Enzymes: No results for input(s): CKTOTAL, CKMB, CKMBINDEX, TROPONINI in the last 168 hours.  BNP (last 3 results) No results for input(s): PROBNP in the last 8760 hours.  HbA1C: No results for input(s): HGBA1C in the last 72 hours.  CBG: Recent Labs  Lab 09/21/17 1435 09/22/17 0047  GLUCAP 116* 178*    Lipid Profile: No results for input(s): CHOL, HDL, LDLCALC, TRIG, CHOLHDL, LDLDIRECT in the last 72 hours.  Thyroid Function Tests: No results for input(s): TSH, T4TOTAL, FREET4, T3FREE, THYROIDAB in the last 72 hours.  Anemia Panel: No results for input(s): VITAMINB12, FOLATE, FERRITIN, TIBC, IRON, RETICCTPCT in the last 72 hours.  Urine analysis:    Component Value Date/Time   COLORURINE YELLOW 09/21/2017 [place] 09/21/2017 1533   LABSPEC 1.013 09/21/2017 1533    PHURINE 7.0 09/21/2017 1533   GLUCOSEU NEGATIVE 09/21/2017 1533   HGBUR NEGATIVE 09/21/2017 1533   BILIRUBINUR NEGATIVE 09/21/2017 1533   KETONESUR 20 (A) 09/21/2017 1533   PROTEINUR NEGATIVE 09/21/2017 1533   UROBILINOGEN 0.2 12/19/2014 1830   NITRITE NEGATIVE 09/21/2017 1533   LEUKOCYTESUR NEGATIVE 09/21/2017 1533    Sepsis Labs: Lactic Acid, Venous    Component Value Date/Time   LATICACIDVEN 1.05 12/23/2012 1836    MICROBIOLOGY: No results found for this or any previous visit (from the past 240 hour(s)).  RADIOLOGY STUDIES/RESULTS: Ct Angio Head W Or Wo Contrast  Result Date: 09/21/2017 CLINICAL DATA:  Initial evaluation for acute aphasia, stroke. EXAM: CT ANGIOGRAPHY HEAD AND NECK CT PERFUSION BRAIN TECHNIQUE: Multidetector CT imaging of the head and neck was performed using the standard protocol during bolus administration of intravenous contrast. Multiplanar CT image reconstructions and MIPs were obtained to evaluate the vascular anatomy. Carotid stenosis measurements (when applicable) are obtained utilizing NASCET criteria, using the distal internal carotid diameter as the denominator. Multiphase CT imaging of the brain was performed following IV bolus contrast injection. Subsequent parametric perfusion maps were calculated using RAPID software. CONTRAST:  35mL ISOVUE-370 IOPAMIDOL (ISOVUE-370) INJECTION 76% COMPARISON:  Prior CT from earlier the same day. FINDINGS: CTA NECK FINDINGS Aortic arch: Visualized aortic arch ectatic with normal 3 vessel morphology. Mild atheromatous plaque within the aortic arch. No hemodynamically significant stenosis about the origin of the great vessels. Visualized subclavian arteries tortuous proximally but widely patent without stenosis. Right carotid system: Right common carotid artery tortuous proximally but widely patent to the bifurcation. A centric plaque about the bifurcation without hemodynamically significant stenosis. Right ICA mildly tortuous  but widely patent to the skull base without stenosis, dissection, or occlusion. Left carotid system: Left common carotid artery tortuous proximally but widely patent to the bifurcation without stenosis. No significant atheromatous narrowing about the left bifurcation. Left ICA tortuous and partially medialized into the retropharyngeal space. Left ICA patent to the skull base without stenosis, dissection, or occlusion. Vertebral arteries: Both of the vertebral arteries arise from the subclavian arteries. Left vertebral artery dominant. Vertebral arteries tortuous proximally but widely patent to the skull base without stenosis, dissection, or occlusion. Skeleton: No acute osseous abnormality. No worrisome lytic or blastic osseous lesions. Other neck: No acute soft tissue abnormality within the neck. Salivary glands normal. Thyroid normal. No adenopathy. Upper  chest: Shotty subcentimeter lymph nodes noted within the visualized upper mediastinum. Scattered atelectatic changes present within the partially visualized lungs. Review of the MIP images confirms the above findings CTA HEAD FINDINGS Anterior circulation: Petrous segments widely patent bilaterally. Mild scattered atheromatous plaque within the cavernous/supraclinoid ICAs without stenosis. ICA termini widely patent. A1 segments patent bilaterally. Left A1 hypoplastic. Normal anterior communicating artery. Anterior cerebral arteries patent to their distal aspects without flow-limiting stenosis. Left M1 patent without stenosis. Normal left MCA bifurcation. There is a proximal left M3 occlusion rising off the inferior division (series 8, image 96). Adjacent inferior M3 branch remains patent. Superior left MCA division remains patent although distally appears attenuated in somewhat irregular. Right M1 mildly irregular and diffusely narrowed without high-grade or flow-limiting stenosis. Normal right MCA bifurcation. No proximal right M2 occlusion. Distal small vessel  atheromatous irregularity present throughout the right MCA branches. Posterior circulation: V4 segments patent to the vertebrobasilar junction without flow-limiting stenosis. Left vertebral artery dominant. Posterior inferior cerebral arteries patent bilaterally. Basilar widely patent to its distal aspect without stenosis. Superior cerebral arteries patent bilaterally. Both of the posterior cerebral arteries supplied via the basilar. Scattered atheromatous irregularity throughout the mid and distal PCAs without high-grade correctable stenosis. Venous sinuses: Not well assessed due to arterial timing of the contrast bolus. Anatomic variants: None significant.  No aneurysm. Delayed phase: Not performed. Review of the MIP images confirms the above findings CT Brain Perfusion Findings: CBF (<30%) Volume: 81mL Perfusion (Tmax>6.0s) volume: 47mL Mismatch Volume: 64mL Infarction Location:Ischemic core infarct involves the left temporoccipital region. Fairly small amount of surrounding ischemic penumbra. IMPRESSION: 1. Acute left M3 occlusion, likely embolic. Associated core infarct involving the left temporoccipital region with relatively small volume 11 mL surrounding ischemic penumbra. 2. Atheromatous change involving the intracranial circulation, most notable at the right M1 segment and bilateral PCAs. No hemodynamically significant or correctable stenosis identified. 3. Mild for age atherosclerotic change about the carotid bifurcations without high-grade stenosis. 4. Diffuse tortuosity of the major arterial vasculature of the neck, suggesting chronic underlying hypertension. Critical Value/emergent results were called by telephone at the time of interpretation on 09/21/2017 at 5:59 pm to Dr. Cheral Marker, who verbally acknowledged these results. Electronically Signed   By: Jeannine Boga M.D.   On: 09/21/2017 18:30   Ct Head Wo Contrast  Result Date: 09/21/2017 CLINICAL DATA:  Altered mental status. EXAM: CT HEAD  WITHOUT CONTRAST TECHNIQUE: Contiguous axial images were obtained from the base of the skull through the vertex without intravenous contrast. COMPARISON:  CT head and MRI brain 03/18/2017. FINDINGS: Brain: There is a new large area of encephalomalacia within the right occipital lobe consistent with an interval stroke (axial images 13 through 17/2). No evidence of acute intracranial hemorrhage, mass lesion, brain edema or extra-axial fluid collection. Patchy low-density in the periventricular white matter appears stable. The ventricles and subarachnoid spaces are appropriately sized for age. There is no CT evidence of acute cortical infarction. Vascular: Intracranial vascular calcifications. No hyperdense vessel identified. Skull: Negative for fracture or focal lesion. Sinuses/Orbits: The visualized paranasal sinuses and mastoid air cells are clear. Hypoplastic frontal sinuses. No orbital abnormalities are seen. Other: None. IMPRESSION: 1. No acute intracranial findings. 2. Interval development of a large area of encephalomalacia in the right occipital lobe consistent with a stroke sustained over the last 6 months. This has no definite acute components but could be further evaluated with MRI if clinically warranted. Electronically Signed   By: Richardean Sale M.D.   On:  09/21/2017 14:58   Ct Angio Neck W Or Wo Contrast  Result Date: 09/21/2017 CLINICAL DATA:  Initial evaluation for acute aphasia, stroke. EXAM: CT ANGIOGRAPHY HEAD AND NECK CT PERFUSION BRAIN TECHNIQUE: Multidetector CT imaging of the head and neck was performed using the standard protocol during bolus administration of intravenous contrast. Multiplanar CT image reconstructions and MIPs were obtained to evaluate the vascular anatomy. Carotid stenosis measurements (when applicable) are obtained utilizing NASCET criteria, using the distal internal carotid diameter as the denominator. Multiphase CT imaging of the brain was performed following IV bolus  contrast injection. Subsequent parametric perfusion maps were calculated using RAPID software. CONTRAST:  5mL ISOVUE-370 IOPAMIDOL (ISOVUE-370) INJECTION 76% COMPARISON:  Prior CT from earlier the same day. FINDINGS: CTA NECK FINDINGS Aortic arch: Visualized aortic arch ectatic with normal 3 vessel morphology. Mild atheromatous plaque within the aortic arch. No hemodynamically significant stenosis about the origin of the great vessels. Visualized subclavian arteries tortuous proximally but widely patent without stenosis. Right carotid system: Right common carotid artery tortuous proximally but widely patent to the bifurcation. A centric plaque about the bifurcation without hemodynamically significant stenosis. Right ICA mildly tortuous but widely patent to the skull base without stenosis, dissection, or occlusion. Left carotid system: Left common carotid artery tortuous proximally but widely patent to the bifurcation without stenosis. No significant atheromatous narrowing about the left bifurcation. Left ICA tortuous and partially medialized into the retropharyngeal space. Left ICA patent to the skull base without stenosis, dissection, or occlusion. Vertebral arteries: Both of the vertebral arteries arise from the subclavian arteries. Left vertebral artery dominant. Vertebral arteries tortuous proximally but widely patent to the skull base without stenosis, dissection, or occlusion. Skeleton: No acute osseous abnormality. No worrisome lytic or blastic osseous lesions. Other neck: No acute soft tissue abnormality within the neck. Salivary glands normal. Thyroid normal. No adenopathy. Upper chest: Shotty subcentimeter lymph nodes noted within the visualized upper mediastinum. Scattered atelectatic changes present within the partially visualized lungs. Review of the MIP images confirms the above findings CTA HEAD FINDINGS Anterior circulation: Petrous segments widely patent bilaterally. Mild scattered atheromatous  plaque within the cavernous/supraclinoid ICAs without stenosis. ICA termini widely patent. A1 segments patent bilaterally. Left A1 hypoplastic. Normal anterior communicating artery. Anterior cerebral arteries patent to their distal aspects without flow-limiting stenosis. Left M1 patent without stenosis. Normal left MCA bifurcation. There is a proximal left M3 occlusion rising off the inferior division (series 8, image 96). Adjacent inferior M3 branch remains patent. Superior left MCA division remains patent although distally appears attenuated in somewhat irregular. Right M1 mildly irregular and diffusely narrowed without high-grade or flow-limiting stenosis. Normal right MCA bifurcation. No proximal right M2 occlusion. Distal small vessel atheromatous irregularity present throughout the right MCA branches. Posterior circulation: V4 segments patent to the vertebrobasilar junction without flow-limiting stenosis. Left vertebral artery dominant. Posterior inferior cerebral arteries patent bilaterally. Basilar widely patent to its distal aspect without stenosis. Superior cerebral arteries patent bilaterally. Both of the posterior cerebral arteries supplied via the basilar. Scattered atheromatous irregularity throughout the mid and distal PCAs without high-grade correctable stenosis. Venous sinuses: Not well assessed due to arterial timing of the contrast bolus. Anatomic variants: None significant.  No aneurysm. Delayed phase: Not performed. Review of the MIP images confirms the above findings CT Brain Perfusion Findings: CBF (<30%) Volume: 66mL Perfusion (Tmax>6.0s) volume: 109mL Mismatch Volume: 5mL Infarction Location:Ischemic core infarct involves the left temporoccipital region. Fairly small amount of surrounding ischemic penumbra. IMPRESSION: 1. Acute left M3 occlusion,  likely embolic. Associated core infarct involving the left temporoccipital region with relatively small volume 11 mL surrounding ischemic penumbra.  2. Atheromatous change involving the intracranial circulation, most notable at the right M1 segment and bilateral PCAs. No hemodynamically significant or correctable stenosis identified. 3. Mild for age atherosclerotic change about the carotid bifurcations without high-grade stenosis. 4. Diffuse tortuosity of the major arterial vasculature of the neck, suggesting chronic underlying hypertension. Critical Value/emergent results were called by telephone at the time of interpretation on 09/21/2017 at 5:59 pm to Dr. Cheral Marker, who verbally acknowledged these results. Electronically Signed   By: Jeannine Boga M.D.   On: 09/21/2017 18:30   Ct Cerebral Perfusion W Contrast  Result Date: 09/21/2017 CLINICAL DATA:  Initial evaluation for acute aphasia, stroke. EXAM: CT ANGIOGRAPHY HEAD AND NECK CT PERFUSION BRAIN TECHNIQUE: Multidetector CT imaging of the head and neck was performed using the standard protocol during bolus administration of intravenous contrast. Multiplanar CT image reconstructions and MIPs were obtained to evaluate the vascular anatomy. Carotid stenosis measurements (when applicable) are obtained utilizing NASCET criteria, using the distal internal carotid diameter as the denominator. Multiphase CT imaging of the brain was performed following IV bolus contrast injection. Subsequent parametric perfusion maps were calculated using RAPID software. CONTRAST:  67mL ISOVUE-370 IOPAMIDOL (ISOVUE-370) INJECTION 76% COMPARISON:  Prior CT from earlier the same day. FINDINGS: CTA NECK FINDINGS Aortic arch: Visualized aortic arch ectatic with normal 3 vessel morphology. Mild atheromatous plaque within the aortic arch. No hemodynamically significant stenosis about the origin of the great vessels. Visualized subclavian arteries tortuous proximally but widely patent without stenosis. Right carotid system: Right common carotid artery tortuous proximally but widely patent to the bifurcation. A centric plaque about the  bifurcation without hemodynamically significant stenosis. Right ICA mildly tortuous but widely patent to the skull base without stenosis, dissection, or occlusion. Left carotid system: Left common carotid artery tortuous proximally but widely patent to the bifurcation without stenosis. No significant atheromatous narrowing about the left bifurcation. Left ICA tortuous and partially medialized into the retropharyngeal space. Left ICA patent to the skull base without stenosis, dissection, or occlusion. Vertebral arteries: Both of the vertebral arteries arise from the subclavian arteries. Left vertebral artery dominant. Vertebral arteries tortuous proximally but widely patent to the skull base without stenosis, dissection, or occlusion. Skeleton: No acute osseous abnormality. No worrisome lytic or blastic osseous lesions. Other neck: No acute soft tissue abnormality within the neck. Salivary glands normal. Thyroid normal. No adenopathy. Upper chest: Shotty subcentimeter lymph nodes noted within the visualized upper mediastinum. Scattered atelectatic changes present within the partially visualized lungs. Review of the MIP images confirms the above findings CTA HEAD FINDINGS Anterior circulation: Petrous segments widely patent bilaterally. Mild scattered atheromatous plaque within the cavernous/supraclinoid ICAs without stenosis. ICA termini widely patent. A1 segments patent bilaterally. Left A1 hypoplastic. Normal anterior communicating artery. Anterior cerebral arteries patent to their distal aspects without flow-limiting stenosis. Left M1 patent without stenosis. Normal left MCA bifurcation. There is a proximal left M3 occlusion rising off the inferior division (series 8, image 96). Adjacent inferior M3 branch remains patent. Superior left MCA division remains patent although distally appears attenuated in somewhat irregular. Right M1 mildly irregular and diffusely narrowed without high-grade or flow-limiting  stenosis. Normal right MCA bifurcation. No proximal right M2 occlusion. Distal small vessel atheromatous irregularity present throughout the right MCA branches. Posterior circulation: V4 segments patent to the vertebrobasilar junction without flow-limiting stenosis. Left vertebral artery dominant. Posterior inferior cerebral arteries patent bilaterally.  Basilar widely patent to its distal aspect without stenosis. Superior cerebral arteries patent bilaterally. Both of the posterior cerebral arteries supplied via the basilar. Scattered atheromatous irregularity throughout the mid and distal PCAs without high-grade correctable stenosis. Venous sinuses: Not well assessed due to arterial timing of the contrast bolus. Anatomic variants: None significant.  No aneurysm. Delayed phase: Not performed. Review of the MIP images confirms the above findings CT Brain Perfusion Findings: CBF (<30%) Volume: 68mL Perfusion (Tmax>6.0s) volume: 52mL Mismatch Volume: 32mL Infarction Location:Ischemic core infarct involves the left temporoccipital region. Fairly small amount of surrounding ischemic penumbra. IMPRESSION: 1. Acute left M3 occlusion, likely embolic. Associated core infarct involving the left temporoccipital region with relatively small volume 11 mL surrounding ischemic penumbra. 2. Atheromatous change involving the intracranial circulation, most notable at the right M1 segment and bilateral PCAs. No hemodynamically significant or correctable stenosis identified. 3. Mild for age atherosclerotic change about the carotid bifurcations without high-grade stenosis. 4. Diffuse tortuosity of the major arterial vasculature of the neck, suggesting chronic underlying hypertension. Critical Value/emergent results were called by telephone at the time of interpretation on 09/21/2017 at 5:59 pm to Dr. Cheral Marker, who verbally acknowledged these results. Electronically Signed   By: Jeannine Boga M.D.   On: 09/21/2017 18:30     LOS: 4  days   Lala Lund, MD  [facility]  If 7PM-7AM, please contact night-coverage  Please page via [email or web address]-click on MD name and type text message  09/25/2017, 12:22 PM

## 2017-09-26 LAB — BASIC METABOLIC PANEL
Anion gap: 6 (ref 5–15)
BUN: 9 mg/dL (ref 8–23)
CHLORIDE: 109 mmol/L (ref 98–111)
CO2: 27 mmol/L (ref 22–32)
CREATININE: 0.62 mg/dL (ref 0.44–1.00)
Calcium: 8.8 mg/dL — ABNORMAL LOW (ref 8.9–10.3)
GFR calc Af Amer: >60 mL/min (ref >60)
GFR calc non Af Amer: >60 mL/min (ref >60)
Glucose, Bld: 112 mg/dL — ABNORMAL HIGH (ref 70–99)
Potassium: 3.8 mmol/L (ref 3.5–5.1)
Sodium: 142 mmol/L (ref 135–145)

## 2017-09-26 LAB — MAGNESIUM: Magnesium: 2 mg/dL (ref 1.7–2.4)

## 2017-09-26 MED ORDER — LACTATED RINGERS IV SOLN: INTRAVENOUS | Status: AC

## 2017-09-26 MED ORDER — FAMOTIDINE 20 MG PO TABS
20.0000 mg | ORAL_TABLET | Freq: Every day | ORAL | Status: DC
  Administered 2017-09-26 – 2017-09-29 (×4): 20 mg via ORAL
  Filled 2017-09-26 (×4): qty 1

## 2017-09-26 MED ORDER — HALOPERIDOL LACTATE 5 MG/ML IJ SOLN
5.0000 mg | Freq: Four times a day (QID) | INTRAMUSCULAR | Status: DC | PRN
  Administered 2017-09-26 – 2017-09-29 (×4): 5 mg via INTRAVENOUS
  Filled 2017-09-26 (×4): qty 1

## 2017-09-26 NOTE — Progress Notes (Signed)
  Speech Language Pathology Treatment: Dysphagia  Patient Details Name: [name] [name] MRN: [id number] DOB: Nov 25, 1924 Today's Date: 09/26/2017 Time: 5830-9407 SLP Time Calculation (min) (ACUTE ONLY): 20 min  Assessment / Plan / Recommendation Clinical Impression  Patient seen for follow-up for dysphagia. Assessed with upgraded texture trials, with RN administering medications crushed in applesauce. Pt keeps her eyes closed, but opens mouth with tactile stimulation, accepting bolus and with appearance of swift oral transit, timely swallow initiation. When SLP placed cup in hand, pt independently brings to her lips, taking small sips without cues (cup and straw), with no overt signs of aspiration. Pt requires total A to self feed purees via spoon, repeatedly attempting to drink from applesauce container. With mod verbal, tactile cues, consumes 2 oz with assistance with no overt signs of aspiration. Recommend advancement to dys 1 (puree), continue thin liquids, with full supervision and assistance, assisting pt with self-feeding as much as possible. May give meds crushed in puree. Daughter-in-law present and SLP educated re: strategies and swallow precautions. Will follow up for tolerance.     HPI HPI: [name] [name] is a 84 y.o. female with medical history significant for coronary artery disease, hypertension, left eye blindness, and history of CVA with residual right-sided visual deficits. Presents with aphasia, confusion, and headache, CTA head and neck reveals acute M3 occlusion with associated infarct in posterior left temporal lobe with 1 mm penumbra. Pt with a witnessed seizure overnight.       SLP Plan  Continue with current plan of care       Recommendations  Diet recommendations: Dysphagia 1 (puree);Thin liquid Liquids provided via: Cup;Straw Medication Administration: Crushed with puree Supervision: Full supervision/cueing for compensatory strategies;Staff to assist with self  feeding Compensations: Minimize environmental distractions;Slow rate;Small sips/bites                Oral Care Recommendations: Oral care BID Follow up Recommendations: Inpatient Rehab SLP Visit Diagnosis: Dysphagia, unspecified (R13.10) Plan: Continue with current plan of care       [place], [place], [place] Speech-Language Pathologist [phone]  Aliene Altes 09/26/2017, 8:42 AM

## 2017-09-26 NOTE — Progress Notes (Signed)
PROGRESS NOTE        PATIENT DETAILS Name: [name] [name] Age: 84 y.o. Sex: female Date of Birth: January 24, 1925 Admit Date: 09/21/2017 Admitting Physician Vianne Bulls, MD [id number], Butch Penny, MD  Brief Narrative: Patient is a 84 y.o. female with history of left eye blindness (corneal scarring), prior history of CVA on Plavix (loop recorder implanted-SVT detected as outpatient), presented with confusion, visual disturbances, sensory aphasia.  CT angiogram brain and CT perfusion study confirmed a left M3 occlusion with acute infarct involving the temporal cortical area.  She was admitted to the hospitalist service, but then developed generalized tonic-clonic seizure and A. fib with RVR.  She was started on IV Keppra, she spontaneously converted back to sinus rhythm.  Hospital course was complicated by delirium-and excessive sedation after using benzodiazepines (had seizure) and narcotics.  See below for further details  Subjective:  Somnolent in bed, unable to answer questions or follow commands.  Family friend bedside.  Assessment/Plan:  Acute CVA: Thought to be embolic-atrial fibrillation confirmed on EKG and telemetry monitoring this admission.  CT angiogram of the neck did not show any stenosis, 2D echocardiogram with preserved EF and without any embolic source.  LDL 95, A1c 5.6.  She unfortunately she was already blind in her left eye from corneal scarring, she now appears to have significant amount of right eye cortical blindness and motor aphasia from acute CVA.  She is able to move all 4 extremities.  Currently on aspirin, and high intensity statin, as per neurology start Eliquis on 09/29/2017 and continue with high intensity statin.    Long-term prognosis does not look good due to advanced age, now almost totally blind as she had left eye blindness from before acute vision loss in the right eye, deconditioning and other comorbidities.  Explained this to the son bedside  in detail on 09/26/2017.  For now full code.  Acute metabolic encephalopathy: Initially felt to be from postictal state and Ativan that she received yesterday morning.  She became very delirious on 7/4 night and received fentanyl.  This a.m.-she is much better-,-following some commands.  EEG on 7/3 negative for seizures, ABG without hypercarbia.  Will attempt to minimize benzodiazepines and Ativan.    PAF with [place] vas 2 score of 7: Maintaining sinus rhythm since yesterday-remains on low-dose IV Lopressor for rate control.  Been started on low-dose oral Bystolic, start Eliquis on 09/29/2017.  History of paroxysmal SVT: Documented on loop recorder data (loop recorder implanted after CVA)-continue telemetry monitoring.  Remains on beta-blockers  Generalized tonic-clonic seizure: Occurred on 7/3-after she was admitted-usually started on Keppra-she still is somewhat sedated this morning-I have switched over to Vimpat. EEG on 7/3 did not show seizure-like activity.  Neurology following.   Hypertension: Allow some amount of permissive hypertension-tolerating low-dose IV Lopressor to control her heart rate.  Low-dose oral beta-blocker continued.  CAD: Currently without any anginal symptoms.  Left eye blindness: She has corneal scarring-unfortunately she may have developed significant visual deficits in her right side from this acute CVA.  Advanced directive/palliative care: Full code for now-the long discussion with daughter and son on 7/4 and 7/5.  Family is still in consultation of whether to change her to a DNR status.  They however would like to see how she does with rehab services before making any firm decisions.  Family aware that she probably  will have some amount of significant visual, speech-and perhaps some amount of cognitive dysfunction as a result of acute CVA.  Her prognosis is guarded at this time  Delirium/encephalopathy.  Encephalopathy due to condition of CVA, blindness and unfamiliar  setting, minimize narcotics and benzodiazepines, low-dose Haldol as needed ordered.  Hypokalemia.  Replaced IV.    DVT Prophylaxis: Prophylactic Lovenox   Code Status: Full code  Family Communication: Family friend at bedside, son bedside  Disposition Plan: Remain inpatient, SNF on discharge  Antimicrobial agents: Anti-infectives (From admission, onward)   None     Procedures: None  CONSULTS:  neurology  Time spent: 52 minutes-Greater than 50% of this time was spent in counseling, explanation of diagnosis, planning of further management, and coordination of care.  MEDICATIONS: Scheduled Meds: . artificial tears  1 application Both Eyes QHS  . aspirin EC  325 mg Oral Daily  . diltiazem  60 mg Oral Q6H  . enoxaparin (LOVENOX) injection  40 mg Subcutaneous Q24H  . magnesium oxide  200 mg Oral QHS  . multivitamin  1 tablet Oral QPC breakfast  . nebivolol  2.5 mg Oral BID  . ondansetron (ZOFRAN) IV  4 mg Intravenous Once  . pravastatin  20 mg Oral q1800  . prednisoLONE acetate  1 drop Both Eyes QHS   Continuous Infusions: . famotidine (PEPCID) IV 20 mg (09/25/17 2330)  . lacosamide (VIMPAT) IV 100 mg (09/26/17 1115)  . lactated ringers     PRN Meds:.acetaminophen **OR** acetaminophen (TYLENOL) oral liquid 160 mg/5 mL **OR** acetaminophen, haloperidol lactate, ondansetron (ZOFRAN) IV, polyvinyl alcohol   PHYSICAL EXAM: Vital signs: Vitals:   09/25/17 2018 09/25/17 2325 09/26/17 0432 09/26/17 1314  BP: (!) 175/70 (!) 116/58 (!) 119/48 (!) 149/59  Pulse: 78 66 63   Resp: 20 (!) 26 19   Temp: 97.8 F (36.6 C) 97.6 F (36.4 C) 97.7 F (36.5 C)   TempSrc: Axillary Axillary Axillary   SpO2: 99% 100% 94%   Weight:      Height:       Filed Weights   09/21/17 2055  Weight: 75.9 kg (167 lb 5.3 oz)   Body mass index is 29.64 kg/m.   Exam   Somnolent and tired, unable to follow commands, got agitated last night and did not sleep, cooperative neuro  exam [place].AT,PERRAL Supple Neck,No JVD, No cervical lymphadenopathy appriciated.  Symmetrical Chest wall movement, Good air movement bilaterally, CTAB RRR,No Gallops, Rubs or new Murmurs, No Parasternal Heave +ve B.Sounds, Abd Soft, No tenderness, No organomegaly appriciated, No rebound - guarding or rigidity. No Cyanosis, Clubbing or edema, No new Rash or bruise  I have personally reviewed following labs and imaging studies  LABORATORY DATA: CBC: Recent Labs  Lab 09/21/17 1425 09/21/17 1440  WBC 5.3  --   NEUTROABS 4.2  --   HGB 14.5 15.0  HCT 46.0 44.0  MCV 96.2  --   PLT 197  --     Basic Metabolic Panel: Recent Labs  Lab 09/21/17 1425 09/21/17 1440 09/23/17 0721 09/25/17 0449 09/25/17 0843 09/26/17 0737  NA 142 139 138 139  --  142  K 4.2 4.1 3.3* 2.8*  --  3.8  CL 105 102 103 103  --  109  CO2 26  --  25 24  --  27  GLUCOSE 125* 121* 89 175*  --  112*  BUN 11 13 13 15   --  9  CREATININE 0.65 0.60 0.72 0.66  --  0.62  CALCIUM 9.0  --  8.4* 8.6*  --  8.8*  MG  --   --   --   --  1.9 2.0    GFR: Estimated Creatinine Clearance: 45.6 mL/min (by C-G formula based on SCr of 0.62 mg/dL).  Liver Function Tests: Recent Labs  Lab 09/21/17 1425  AST 31  ALT 25  ALKPHOS 94  BILITOT 0.7  PROT 6.5  ALBUMIN 3.9   No results for input(s): LIPASE, AMYLASE in the last 168 hours. No results for input(s): AMMONIA in the last 168 hours.  Coagulation Profile: Recent Labs  Lab 09/21/17 1425  INR 0.96    Cardiac Enzymes: No results for input(s): CKTOTAL, CKMB, CKMBINDEX, TROPONINI in the last 168 hours.  BNP (last 3 results) No results for input(s): PROBNP in the last 8760 hours.  HbA1C: No results for input(s): HGBA1C in the last 72 hours.  CBG: Recent Labs  Lab 09/21/17 1435 09/22/17 0047  GLUCAP 116* 178*    Lipid Profile: No results for input(s): CHOL, HDL, LDLCALC, TRIG, CHOLHDL, LDLDIRECT in the last 72 hours.  Thyroid Function Tests: No  results for input(s): TSH, T4TOTAL, FREET4, T3FREE, THYROIDAB in the last 72 hours.  Anemia Panel: No results for input(s): VITAMINB12, FOLATE, FERRITIN, TIBC, IRON, RETICCTPCT in the last 72 hours.  Urine analysis:    Component Value Date/Time   COLORURINE YELLOW 09/21/2017 [place] 09/21/2017 1533   LABSPEC 1.013 09/21/2017 1533   PHURINE 7.0 09/21/2017 1533   GLUCOSEU NEGATIVE 09/21/2017 1533   HGBUR NEGATIVE 09/21/2017 1533   BILIRUBINUR NEGATIVE 09/21/2017 1533   KETONESUR 20 (A) 09/21/2017 1533   PROTEINUR NEGATIVE 09/21/2017 1533   UROBILINOGEN 0.2 12/19/2014 1830   NITRITE NEGATIVE 09/21/2017 1533   LEUKOCYTESUR NEGATIVE 09/21/2017 1533    Sepsis Labs: Lactic Acid, Venous    Component Value Date/Time   LATICACIDVEN 1.05 12/23/2012 1836    MICROBIOLOGY: No results found for this or any previous visit (from the past 240 hour(s)).  RADIOLOGY STUDIES/RESULTS: Ct Angio Head W Or Wo Contrast  Result Date: 09/21/2017 CLINICAL DATA:  Initial evaluation for acute aphasia, stroke. EXAM: CT ANGIOGRAPHY HEAD AND NECK CT PERFUSION BRAIN TECHNIQUE: Multidetector CT imaging of the head and neck was performed using the standard protocol during bolus administration of intravenous contrast. Multiplanar CT image reconstructions and MIPs were obtained to evaluate the vascular anatomy. Carotid stenosis measurements (when applicable) are obtained utilizing NASCET criteria, using the distal internal carotid diameter as the denominator. Multiphase CT imaging of the brain was performed following IV bolus contrast injection. Subsequent parametric perfusion maps were calculated using RAPID software. CONTRAST:  69mL ISOVUE-370 IOPAMIDOL (ISOVUE-370) INJECTION 76% COMPARISON:  Prior CT from earlier the same day. FINDINGS: CTA NECK FINDINGS Aortic arch: Visualized aortic arch ectatic with normal 3 vessel morphology. Mild atheromatous plaque within the aortic arch. No hemodynamically  significant stenosis about the origin of the great vessels. Visualized subclavian arteries tortuous proximally but widely patent without stenosis. Right carotid system: Right common carotid artery tortuous proximally but widely patent to the bifurcation. A centric plaque about the bifurcation without hemodynamically significant stenosis. Right ICA mildly tortuous but widely patent to the skull base without stenosis, dissection, or occlusion. Left carotid system: Left common carotid artery tortuous proximally but widely patent to the bifurcation without stenosis. No significant atheromatous narrowing about the left bifurcation. Left ICA tortuous and partially medialized into the retropharyngeal space. Left ICA patent to the skull base without stenosis, dissection, or occlusion.  Vertebral arteries: Both of the vertebral arteries arise from the subclavian arteries. Left vertebral artery dominant. Vertebral arteries tortuous proximally but widely patent to the skull base without stenosis, dissection, or occlusion. Skeleton: No acute osseous abnormality. No worrisome lytic or blastic osseous lesions. Other neck: No acute soft tissue abnormality within the neck. Salivary glands normal. Thyroid normal. No adenopathy. Upper chest: Shotty subcentimeter lymph nodes noted within the visualized upper mediastinum. Scattered atelectatic changes present within the partially visualized lungs. Review of the MIP images confirms the above findings CTA HEAD FINDINGS Anterior circulation: Petrous segments widely patent bilaterally. Mild scattered atheromatous plaque within the cavernous/supraclinoid ICAs without stenosis. ICA termini widely patent. A1 segments patent bilaterally. Left A1 hypoplastic. Normal anterior communicating artery. Anterior cerebral arteries patent to their distal aspects without flow-limiting stenosis. Left M1 patent without stenosis. Normal left MCA bifurcation. There is a proximal left M3 occlusion rising off  the inferior division (series 8, image 96). Adjacent inferior M3 branch remains patent. Superior left MCA division remains patent although distally appears attenuated in somewhat irregular. Right M1 mildly irregular and diffusely narrowed without high-grade or flow-limiting stenosis. Normal right MCA bifurcation. No proximal right M2 occlusion. Distal small vessel atheromatous irregularity present throughout the right MCA branches. Posterior circulation: V4 segments patent to the vertebrobasilar junction without flow-limiting stenosis. Left vertebral artery dominant. Posterior inferior cerebral arteries patent bilaterally. Basilar widely patent to its distal aspect without stenosis. Superior cerebral arteries patent bilaterally. Both of the posterior cerebral arteries supplied via the basilar. Scattered atheromatous irregularity throughout the mid and distal PCAs without high-grade correctable stenosis. Venous sinuses: Not well assessed due to arterial timing of the contrast bolus. Anatomic variants: None significant.  No aneurysm. Delayed phase: Not performed. Review of the MIP images confirms the above findings CT Brain Perfusion Findings: CBF (<30%) Volume: 71mL Perfusion (Tmax>6.0s) volume: 67mL Mismatch Volume: 49mL Infarction Location:Ischemic core infarct involves the left temporoccipital region. Fairly small amount of surrounding ischemic penumbra. IMPRESSION: 1. Acute left M3 occlusion, likely embolic. Associated core infarct involving the left temporoccipital region with relatively small volume 11 mL surrounding ischemic penumbra. 2. Atheromatous change involving the intracranial circulation, most notable at the right M1 segment and bilateral PCAs. No hemodynamically significant or correctable stenosis identified. 3. Mild for age atherosclerotic change about the carotid bifurcations without high-grade stenosis. 4. Diffuse tortuosity of the major arterial vasculature of the neck, suggesting chronic  underlying hypertension. Critical Value/emergent results were called by telephone at the time of interpretation on 09/21/2017 at 5:59 pm to Dr. Cheral Marker, who verbally acknowledged these results. Electronically Signed   By: Jeannine Boga M.D.   On: 09/21/2017 18:30   Ct Head Wo Contrast  Result Date: 09/21/2017 CLINICAL DATA:  Altered mental status. EXAM: CT HEAD WITHOUT CONTRAST TECHNIQUE: Contiguous axial images were obtained from the base of the skull through the vertex without intravenous contrast. COMPARISON:  CT head and MRI brain 03/18/2017. FINDINGS: Brain: There is a new large area of encephalomalacia within the right occipital lobe consistent with an interval stroke (axial images 13 through 17/2). No evidence of acute intracranial hemorrhage, mass lesion, brain edema or extra-axial fluid collection. Patchy low-density in the periventricular white matter appears stable. The ventricles and subarachnoid spaces are appropriately sized for age. There is no CT evidence of acute cortical infarction. Vascular: Intracranial vascular calcifications. No hyperdense vessel identified. Skull: Negative for fracture or focal lesion. Sinuses/Orbits: The visualized paranasal sinuses and mastoid air cells are clear. Hypoplastic frontal sinuses. No orbital abnormalities  are seen. Other: None. IMPRESSION: 1. No acute intracranial findings. 2. Interval development of a large area of encephalomalacia in the right occipital lobe consistent with a stroke sustained over the last 6 months. This has no definite acute components but could be further evaluated with MRI if clinically warranted. Electronically Signed   By: Richardean Sale M.D.   On: 09/21/2017 14:58   Ct Angio Neck W Or Wo Contrast  Result Date: 09/21/2017 CLINICAL DATA:  Initial evaluation for acute aphasia, stroke. EXAM: CT ANGIOGRAPHY HEAD AND NECK CT PERFUSION BRAIN TECHNIQUE: Multidetector CT imaging of the head and neck was performed using the standard  protocol during bolus administration of intravenous contrast. Multiplanar CT image reconstructions and MIPs were obtained to evaluate the vascular anatomy. Carotid stenosis measurements (when applicable) are obtained utilizing NASCET criteria, using the distal internal carotid diameter as the denominator. Multiphase CT imaging of the brain was performed following IV bolus contrast injection. Subsequent parametric perfusion maps were calculated using RAPID software. CONTRAST:  10mL ISOVUE-370 IOPAMIDOL (ISOVUE-370) INJECTION 76% COMPARISON:  Prior CT from earlier the same day. FINDINGS: CTA NECK FINDINGS Aortic arch: Visualized aortic arch ectatic with normal 3 vessel morphology. Mild atheromatous plaque within the aortic arch. No hemodynamically significant stenosis about the origin of the great vessels. Visualized subclavian arteries tortuous proximally but widely patent without stenosis. Right carotid system: Right common carotid artery tortuous proximally but widely patent to the bifurcation. A centric plaque about the bifurcation without hemodynamically significant stenosis. Right ICA mildly tortuous but widely patent to the skull base without stenosis, dissection, or occlusion. Left carotid system: Left common carotid artery tortuous proximally but widely patent to the bifurcation without stenosis. No significant atheromatous narrowing about the left bifurcation. Left ICA tortuous and partially medialized into the retropharyngeal space. Left ICA patent to the skull base without stenosis, dissection, or occlusion. Vertebral arteries: Both of the vertebral arteries arise from the subclavian arteries. Left vertebral artery dominant. Vertebral arteries tortuous proximally but widely patent to the skull base without stenosis, dissection, or occlusion. Skeleton: No acute osseous abnormality. No worrisome lytic or blastic osseous lesions. Other neck: No acute soft tissue abnormality within the neck. Salivary glands  normal. Thyroid normal. No adenopathy. Upper chest: Shotty subcentimeter lymph nodes noted within the visualized upper mediastinum. Scattered atelectatic changes present within the partially visualized lungs. Review of the MIP images confirms the above findings CTA HEAD FINDINGS Anterior circulation: Petrous segments widely patent bilaterally. Mild scattered atheromatous plaque within the cavernous/supraclinoid ICAs without stenosis. ICA termini widely patent. A1 segments patent bilaterally. Left A1 hypoplastic. Normal anterior communicating artery. Anterior cerebral arteries patent to their distal aspects without flow-limiting stenosis. Left M1 patent without stenosis. Normal left MCA bifurcation. There is a proximal left M3 occlusion rising off the inferior division (series 8, image 96). Adjacent inferior M3 branch remains patent. Superior left MCA division remains patent although distally appears attenuated in somewhat irregular. Right M1 mildly irregular and diffusely narrowed without high-grade or flow-limiting stenosis. Normal right MCA bifurcation. No proximal right M2 occlusion. Distal small vessel atheromatous irregularity present throughout the right MCA branches. Posterior circulation: V4 segments patent to the vertebrobasilar junction without flow-limiting stenosis. Left vertebral artery dominant. Posterior inferior cerebral arteries patent bilaterally. Basilar widely patent to its distal aspect without stenosis. Superior cerebral arteries patent bilaterally. Both of the posterior cerebral arteries supplied via the basilar. Scattered atheromatous irregularity throughout the mid and distal PCAs without high-grade correctable stenosis. Venous sinuses: Not well assessed due to arterial  timing of the contrast bolus. Anatomic variants: None significant.  No aneurysm. Delayed phase: Not performed. Review of the MIP images confirms the above findings CT Brain Perfusion Findings: CBF (<30%) Volume: 65mL  Perfusion (Tmax>6.0s) volume: 62mL Mismatch Volume: 55mL Infarction Location:Ischemic core infarct involves the left temporoccipital region. Fairly small amount of surrounding ischemic penumbra. IMPRESSION: 1. Acute left M3 occlusion, likely embolic. Associated core infarct involving the left temporoccipital region with relatively small volume 11 mL surrounding ischemic penumbra. 2. Atheromatous change involving the intracranial circulation, most notable at the right M1 segment and bilateral PCAs. No hemodynamically significant or correctable stenosis identified. 3. Mild for age atherosclerotic change about the carotid bifurcations without high-grade stenosis. 4. Diffuse tortuosity of the major arterial vasculature of the neck, suggesting chronic underlying hypertension. Critical Value/emergent results were called by telephone at the time of interpretation on 09/21/2017 at 5:59 pm to Dr. Cheral Marker, who verbally acknowledged these results. Electronically Signed   By: Jeannine Boga M.D.   On: 09/21/2017 18:30   Ct Cerebral Perfusion W Contrast  Result Date: 09/21/2017 CLINICAL DATA:  Initial evaluation for acute aphasia, stroke. EXAM: CT ANGIOGRAPHY HEAD AND NECK CT PERFUSION BRAIN TECHNIQUE: Multidetector CT imaging of the head and neck was performed using the standard protocol during bolus administration of intravenous contrast. Multiplanar CT image reconstructions and MIPs were obtained to evaluate the vascular anatomy. Carotid stenosis measurements (when applicable) are obtained utilizing NASCET criteria, using the distal internal carotid diameter as the denominator. Multiphase CT imaging of the brain was performed following IV bolus contrast injection. Subsequent parametric perfusion maps were calculated using RAPID software. CONTRAST:  77mL ISOVUE-370 IOPAMIDOL (ISOVUE-370) INJECTION 76% COMPARISON:  Prior CT from earlier the same day. FINDINGS: CTA NECK FINDINGS Aortic arch: Visualized aortic arch ectatic  with normal 3 vessel morphology. Mild atheromatous plaque within the aortic arch. No hemodynamically significant stenosis about the origin of the great vessels. Visualized subclavian arteries tortuous proximally but widely patent without stenosis. Right carotid system: Right common carotid artery tortuous proximally but widely patent to the bifurcation. A centric plaque about the bifurcation without hemodynamically significant stenosis. Right ICA mildly tortuous but widely patent to the skull base without stenosis, dissection, or occlusion. Left carotid system: Left common carotid artery tortuous proximally but widely patent to the bifurcation without stenosis. No significant atheromatous narrowing about the left bifurcation. Left ICA tortuous and partially medialized into the retropharyngeal space. Left ICA patent to the skull base without stenosis, dissection, or occlusion. Vertebral arteries: Both of the vertebral arteries arise from the subclavian arteries. Left vertebral artery dominant. Vertebral arteries tortuous proximally but widely patent to the skull base without stenosis, dissection, or occlusion. Skeleton: No acute osseous abnormality. No worrisome lytic or blastic osseous lesions. Other neck: No acute soft tissue abnormality within the neck. Salivary glands normal. Thyroid normal. No adenopathy. Upper chest: Shotty subcentimeter lymph nodes noted within the visualized upper mediastinum. Scattered atelectatic changes present within the partially visualized lungs. Review of the MIP images confirms the above findings CTA HEAD FINDINGS Anterior circulation: Petrous segments widely patent bilaterally. Mild scattered atheromatous plaque within the cavernous/supraclinoid ICAs without stenosis. ICA termini widely patent. A1 segments patent bilaterally. Left A1 hypoplastic. Normal anterior communicating artery. Anterior cerebral arteries patent to their distal aspects without flow-limiting stenosis. Left M1  patent without stenosis. Normal left MCA bifurcation. There is a proximal left M3 occlusion rising off the inferior division (series 8, image 96). Adjacent inferior M3 branch remains patent. Superior left MCA division remains  patent although distally appears attenuated in somewhat irregular. Right M1 mildly irregular and diffusely narrowed without high-grade or flow-limiting stenosis. Normal right MCA bifurcation. No proximal right M2 occlusion. Distal small vessel atheromatous irregularity present throughout the right MCA branches. Posterior circulation: V4 segments patent to the vertebrobasilar junction without flow-limiting stenosis. Left vertebral artery dominant. Posterior inferior cerebral arteries patent bilaterally. Basilar widely patent to its distal aspect without stenosis. Superior cerebral arteries patent bilaterally. Both of the posterior cerebral arteries supplied via the basilar. Scattered atheromatous irregularity throughout the mid and distal PCAs without high-grade correctable stenosis. Venous sinuses: Not well assessed due to arterial timing of the contrast bolus. Anatomic variants: None significant.  No aneurysm. Delayed phase: Not performed. Review of the MIP images confirms the above findings CT Brain Perfusion Findings: CBF (<30%) Volume: 7mL Perfusion (Tmax>6.0s) volume: 52mL Mismatch Volume: 59mL Infarction Location:Ischemic core infarct involves the left temporoccipital region. Fairly small amount of surrounding ischemic penumbra. IMPRESSION: 1. Acute left M3 occlusion, likely embolic. Associated core infarct involving the left temporoccipital region with relatively small volume 11 mL surrounding ischemic penumbra. 2. Atheromatous change involving the intracranial circulation, most notable at the right M1 segment and bilateral PCAs. No hemodynamically significant or correctable stenosis identified. 3. Mild for age atherosclerotic change about the carotid bifurcations without high-grade  stenosis. 4. Diffuse tortuosity of the major arterial vasculature of the neck, suggesting chronic underlying hypertension. Critical Value/emergent results were called by telephone at the time of interpretation on 09/21/2017 at 5:59 pm to Dr. Cheral Marker, who verbally acknowledged these results. Electronically Signed   By: Jeannine Boga M.D.   On: 09/21/2017 18:30     LOS: 5 days   Lala Lund, MD  [facility]  If 7PM-7AM, please contact night-coverage  Please page via [email or web address]-click on MD name and type text message  09/26/2017, 1:35 PM

## 2017-09-26 NOTE — Progress Notes (Signed)
Daughter [name] called and requested PRN Ativan for pt. RN told daughter MD would be contacted. PRN Ativan was ordered by on call MD. Family friend at bedside overnight. Will continue to monitor.

## 2017-09-27 NOTE — Progress Notes (Signed)
PROGRESS NOTE        PATIENT DETAILS Name: [name] [name] Age: 84 y.o. Sex: female Date of Birth: 01/21/1925 Admit Date: 09/21/2017 Admitting Physician Vianne Bulls, MD [id number], Butch Penny, MD  Brief Narrative: Patient is a 84 y.o. female with history of left eye blindness (corneal scarring), prior history of CVA on Plavix (loop recorder implanted-SVT detected as outpatient), presented with confusion, visual disturbances, sensory aphasia.  CT angiogram brain and CT perfusion study confirmed a left M3 occlusion with acute infarct involving the temporal cortical area.  She was admitted to the hospitalist service, but then developed generalized tonic-clonic seizure and A. fib with RVR.  She was started on IV Keppra, she spontaneously converted back to sinus rhythm.  Hospital course was complicated by delirium-and excessive sedation after using benzodiazepines (had seizure) and narcotics.  See below for further details  Subjective:  Somnolent in bed, unable to answer questions or follow commands.  Daughter and granddaughter bedside.  Assessment/Plan:  Acute CVA: Thought to be embolic-atrial fibrillation confirmed on EKG and telemetry monitoring this admission.  CT angiogram of the neck did not show any stenosis, 2D echocardiogram with preserved EF and without any embolic source.  LDL 95, A1c 5.6.  She unfortunately she was already blind in her left eye from corneal scarring, she now appears to have significant amount of right eye cortical blindness and motor aphasia from acute CVA.  She is able to move all 4 extremities.  Currently on aspirin, and high intensity statin, as per neurology start Eliquis on 09/29/2017 and continue with high intensity statin.    Long-term prognosis does not look good due to advanced age, now almost totally blind as she had left eye blindness from before acute vision loss in the right eye, deconditioning and other comorbidities.  Explained this to  the son bedside in detail on 09/26/2017.  For now full code.  Acute metabolic encephalopathy: Initially felt to be from postictal state and Ativan that she received yesterday morning.  She became very delirious on 7/4 night and received fentanyl.  This a.m.-she is much better-,-following some commands.  EEG on 7/3 negative for seizures, ABG without hypercarbia.  Will attempt to minimize benzodiazepines and Ativan.    PAF with [place] vas 2 score of 7: Maintaining sinus rhythm since yesterday-remains on low-dose IV Lopressor for rate control.  Been started on low-dose oral Bystolic, start Eliquis on 09/29/2017.  History of paroxysmal SVT: Documented on loop recorder data (loop recorder implanted after CVA)-continue telemetry monitoring.  Remains on beta-blockers  Dysphasia due to combination of CVA, blindness and metabolic encephalopathy.  Speech following.  Currently on dysphagia 1 diet, remains high risk for aspiration.  We will continue to monitor.    Generalized tonic-clonic seizure: Occurred on 7/3-after she was admitted-usually started on Keppra-she still is somewhat sedated this morning-I have switched over to Vimpat. EEG on 7/3 did not show seizure-like activity.  Neurology following.   Hypertension: Allow some amount of permissive hypertension-tolerating low-dose IV Lopressor to control her heart rate.  Low-dose oral beta-blocker continued.  CAD: Currently without any anginal symptoms.  Left eye blindness: She has corneal scarring-unfortunately she may have developed significant visual deficits in her right side from this acute CVA.  Advanced directive/palliative care: Full code for now-the long discussion with daughter and son on 7/4 and 7/5.  Family is still in  consultation of whether to change her to a DNR status.  They however would like to see how she does with rehab services before making any firm decisions.  Family aware that she probably will have some amount of significant visual,  speech-and perhaps some amount of cognitive dysfunction as a result of acute CVA.  Her prognosis is guarded at this time  Delirium/encephalopathy.  Encephalopathy due to condition of CVA, blindness and unfamiliar setting, minimize narcotics and benzodiazepines, low-dose Haldol as needed ordered.  Hypokalemia.  Replaced IV and stable.    DVT Prophylaxis: Prophylactic Lovenox   Code Status: Full code  Family Communication: Family friend at bedside, son bedside  Disposition Plan: Remain inpatient, SNF on discharge  Antimicrobial agents: Anti-infectives (From admission, onward)   None     Procedures: None  CONSULTS:  neurology  Time spent: 80 minutes-Greater than 50% of this time was spent in counseling, explanation of diagnosis, planning of further management, and coordination of care.  MEDICATIONS: Scheduled Meds: . artificial tears  1 application Both Eyes QHS  . aspirin EC  325 mg Oral Daily  . diltiazem  60 mg Oral Q6H  . enoxaparin (LOVENOX) injection  40 mg Subcutaneous Q24H  . famotidine  20 mg Oral QHS  . magnesium oxide  200 mg Oral QHS  . multivitamin  1 tablet Oral QPC breakfast  . nebivolol  2.5 mg Oral BID  . ondansetron (ZOFRAN) IV  4 mg Intravenous Once  . pravastatin  20 mg Oral q1800  . prednisoLONE acetate  1 drop Both Eyes QHS   Continuous Infusions: . lacosamide (VIMPAT) IV 100 mg (09/27/17 1037)  . lactated ringers     PRN Meds:.acetaminophen **OR** acetaminophen (TYLENOL) oral liquid 160 mg/5 mL **OR** acetaminophen, haloperidol lactate, ondansetron (ZOFRAN) IV, polyvinyl alcohol   PHYSICAL EXAM: Vital signs: Vitals:   09/26/17 1954 09/26/17 2328 09/27/17 0335 09/27/17 0826  BP: (!) 171/67 (!) 146/75 129/61 (!) 151/69  Pulse: 95 72 66 77  Resp: (!) 23 (!) 25 18   Temp: 98.5 F (36.9 C) 98.2 F (36.8 C) (!) 97.4 F (36.3 C) 97.8 F (36.6 C)  TempSrc: Oral Axillary Axillary Axillary  SpO2: 96% 95% 98% 98%  Weight:      Height:        Filed Weights   09/21/17 2055  Weight: 75.9 kg (167 lb 5.3 oz)   Body mass index is 29.64 kg/m.   Exam   Awake but appears to have delirium, unable to cooperate with neuro exam [place].AT,PERRAL Supple Neck,No JVD, No cervical lymphadenopathy appriciated.  Symmetrical Chest wall movement, Good air movement bilaterally, CTAB RRR,No Gallops, Rubs or new Murmurs, No Parasternal Heave +ve B.Sounds, Abd Soft, No tenderness, No organomegaly appriciated, No rebound - guarding or rigidity. No Cyanosis, Clubbing or edema, No new Rash or bruise  I have personally reviewed following labs and imaging studies  LABORATORY DATA: CBC: Recent Labs  Lab 09/21/17 1425 09/21/17 1440  WBC 5.3  --   NEUTROABS 4.2  --   HGB 14.5 15.0  HCT 46.0 44.0  MCV 96.2  --   PLT 197  --     Basic Metabolic Panel: Recent Labs  Lab 09/21/17 1425 09/21/17 1440 09/23/17 0721 09/25/17 0449 09/25/17 0843 09/26/17 0737  NA 142 139 138 139  --  142  K 4.2 4.1 3.3* 2.8*  --  3.8  CL 105 102 103 103  --  109  CO2 26  --  25 24  --  27  GLUCOSE 125* 121* 89 175*  --  112*  BUN 11 13 13 15   --  9  CREATININE 0.65 0.60 0.72 0.66  --  0.62  CALCIUM 9.0  --  8.4* 8.6*  --  8.8*  MG  --   --   --   --  1.9 2.0    GFR: Estimated Creatinine Clearance: 45.6 mL/min (by C-G formula based on SCr of 0.62 mg/dL).  Liver Function Tests: Recent Labs  Lab 09/21/17 1425  AST 31  ALT 25  ALKPHOS 94  BILITOT 0.7  PROT 6.5  ALBUMIN 3.9   No results for input(s): LIPASE, AMYLASE in the last 168 hours. No results for input(s): AMMONIA in the last 168 hours.  Coagulation Profile: Recent Labs  Lab 09/21/17 1425  INR 0.96    Cardiac Enzymes: No results for input(s): CKTOTAL, CKMB, CKMBINDEX, TROPONINI in the last 168 hours.  BNP (last 3 results) No results for input(s): PROBNP in the last 8760 hours.  HbA1C: No results for input(s): HGBA1C in the last 72 hours.  CBG: Recent Labs  Lab 09/21/17 1435  09/22/17 0047  GLUCAP 116* 178*    Lipid Profile: No results for input(s): CHOL, HDL, LDLCALC, TRIG, CHOLHDL, LDLDIRECT in the last 72 hours.  Thyroid Function Tests: No results for input(s): TSH, T4TOTAL, FREET4, T3FREE, THYROIDAB in the last 72 hours.  Anemia Panel: No results for input(s): VITAMINB12, FOLATE, FERRITIN, TIBC, IRON, RETICCTPCT in the last 72 hours.  Urine analysis:    Component Value Date/Time   COLORURINE YELLOW 09/21/2017 [place] 09/21/2017 1533   LABSPEC 1.013 09/21/2017 1533   PHURINE 7.0 09/21/2017 1533   GLUCOSEU NEGATIVE 09/21/2017 1533   HGBUR NEGATIVE 09/21/2017 1533   BILIRUBINUR NEGATIVE 09/21/2017 1533   KETONESUR 20 (A) 09/21/2017 1533   PROTEINUR NEGATIVE 09/21/2017 1533   UROBILINOGEN 0.2 12/19/2014 1830   NITRITE NEGATIVE 09/21/2017 1533   LEUKOCYTESUR NEGATIVE 09/21/2017 1533    Sepsis Labs: Lactic Acid, Venous    Component Value Date/Time   LATICACIDVEN 1.05 12/23/2012 1836    MICROBIOLOGY: No results found for this or any previous visit (from the past 240 hour(s)).  RADIOLOGY STUDIES/RESULTS: Ct Angio Head W Or Wo Contrast  Result Date: 09/21/2017 CLINICAL DATA:  Initial evaluation for acute aphasia, stroke. EXAM: CT ANGIOGRAPHY HEAD AND NECK CT PERFUSION BRAIN TECHNIQUE: Multidetector CT imaging of the head and neck was performed using the standard protocol during bolus administration of intravenous contrast. Multiplanar CT image reconstructions and MIPs were obtained to evaluate the vascular anatomy. Carotid stenosis measurements (when applicable) are obtained utilizing NASCET criteria, using the distal internal carotid diameter as the denominator. Multiphase CT imaging of the brain was performed following IV bolus contrast injection. Subsequent parametric perfusion maps were calculated using RAPID software. CONTRAST:  29mL ISOVUE-370 IOPAMIDOL (ISOVUE-370) INJECTION 76% COMPARISON:  Prior CT from earlier the same  day. FINDINGS: CTA NECK FINDINGS Aortic arch: Visualized aortic arch ectatic with normal 3 vessel morphology. Mild atheromatous plaque within the aortic arch. No hemodynamically significant stenosis about the origin of the great vessels. Visualized subclavian arteries tortuous proximally but widely patent without stenosis. Right carotid system: Right common carotid artery tortuous proximally but widely patent to the bifurcation. A centric plaque about the bifurcation without hemodynamically significant stenosis. Right ICA mildly tortuous but widely patent to the skull base without stenosis, dissection, or occlusion. Left carotid system: Left common carotid artery tortuous proximally but widely patent to the bifurcation  without stenosis. No significant atheromatous narrowing about the left bifurcation. Left ICA tortuous and partially medialized into the retropharyngeal space. Left ICA patent to the skull base without stenosis, dissection, or occlusion. Vertebral arteries: Both of the vertebral arteries arise from the subclavian arteries. Left vertebral artery dominant. Vertebral arteries tortuous proximally but widely patent to the skull base without stenosis, dissection, or occlusion. Skeleton: No acute osseous abnormality. No worrisome lytic or blastic osseous lesions. Other neck: No acute soft tissue abnormality within the neck. Salivary glands normal. Thyroid normal. No adenopathy. Upper chest: Shotty subcentimeter lymph nodes noted within the visualized upper mediastinum. Scattered atelectatic changes present within the partially visualized lungs. Review of the MIP images confirms the above findings CTA HEAD FINDINGS Anterior circulation: Petrous segments widely patent bilaterally. Mild scattered atheromatous plaque within the cavernous/supraclinoid ICAs without stenosis. ICA termini widely patent. A1 segments patent bilaterally. Left A1 hypoplastic. Normal anterior communicating artery. Anterior cerebral  arteries patent to their distal aspects without flow-limiting stenosis. Left M1 patent without stenosis. Normal left MCA bifurcation. There is a proximal left M3 occlusion rising off the inferior division (series 8, image 96). Adjacent inferior M3 branch remains patent. Superior left MCA division remains patent although distally appears attenuated in somewhat irregular. Right M1 mildly irregular and diffusely narrowed without high-grade or flow-limiting stenosis. Normal right MCA bifurcation. No proximal right M2 occlusion. Distal small vessel atheromatous irregularity present throughout the right MCA branches. Posterior circulation: V4 segments patent to the vertebrobasilar junction without flow-limiting stenosis. Left vertebral artery dominant. Posterior inferior cerebral arteries patent bilaterally. Basilar widely patent to its distal aspect without stenosis. Superior cerebral arteries patent bilaterally. Both of the posterior cerebral arteries supplied via the basilar. Scattered atheromatous irregularity throughout the mid and distal PCAs without high-grade correctable stenosis. Venous sinuses: Not well assessed due to arterial timing of the contrast bolus. Anatomic variants: None significant.  No aneurysm. Delayed phase: Not performed. Review of the MIP images confirms the above findings CT Brain Perfusion Findings: CBF (<30%) Volume: 13mL Perfusion (Tmax>6.0s) volume: 51mL Mismatch Volume: 66mL Infarction Location:Ischemic core infarct involves the left temporoccipital region. Fairly small amount of surrounding ischemic penumbra. IMPRESSION: 1. Acute left M3 occlusion, likely embolic. Associated core infarct involving the left temporoccipital region with relatively small volume 11 mL surrounding ischemic penumbra. 2. Atheromatous change involving the intracranial circulation, most notable at the right M1 segment and bilateral PCAs. No hemodynamically significant or correctable stenosis identified. 3. Mild for  age atherosclerotic change about the carotid bifurcations without high-grade stenosis. 4. Diffuse tortuosity of the major arterial vasculature of the neck, suggesting chronic underlying hypertension. Critical Value/emergent results were called by telephone at the time of interpretation on 09/21/2017 at 5:59 pm to Dr. Cheral Marker, who verbally acknowledged these results. Electronically Signed   By: Jeannine Boga M.D.   On: 09/21/2017 18:30   Ct Head Wo Contrast  Result Date: 09/21/2017 CLINICAL DATA:  Altered mental status. EXAM: CT HEAD WITHOUT CONTRAST TECHNIQUE: Contiguous axial images were obtained from the base of the skull through the vertex without intravenous contrast. COMPARISON:  CT head and MRI brain 03/18/2017. FINDINGS: Brain: There is a new large area of encephalomalacia within the right occipital lobe consistent with an interval stroke (axial images 13 through 17/2). No evidence of acute intracranial hemorrhage, mass lesion, brain edema or extra-axial fluid collection. Patchy low-density in the periventricular white matter appears stable. The ventricles and subarachnoid spaces are appropriately sized for age. There is no CT evidence of acute cortical infarction.  Vascular: Intracranial vascular calcifications. No hyperdense vessel identified. Skull: Negative for fracture or focal lesion. Sinuses/Orbits: The visualized paranasal sinuses and mastoid air cells are clear. Hypoplastic frontal sinuses. No orbital abnormalities are seen. Other: None. IMPRESSION: 1. No acute intracranial findings. 2. Interval development of a large area of encephalomalacia in the right occipital lobe consistent with a stroke sustained over the last 6 months. This has no definite acute components but could be further evaluated with MRI if clinically warranted. Electronically Signed   By: Richardean Sale M.D.   On: 09/21/2017 14:58   Ct Angio Neck W Or Wo Contrast  Result Date: 09/21/2017 CLINICAL DATA:  Initial evaluation  for acute aphasia, stroke. EXAM: CT ANGIOGRAPHY HEAD AND NECK CT PERFUSION BRAIN TECHNIQUE: Multidetector CT imaging of the head and neck was performed using the standard protocol during bolus administration of intravenous contrast. Multiplanar CT image reconstructions and MIPs were obtained to evaluate the vascular anatomy. Carotid stenosis measurements (when applicable) are obtained utilizing NASCET criteria, using the distal internal carotid diameter as the denominator. Multiphase CT imaging of the brain was performed following IV bolus contrast injection. Subsequent parametric perfusion maps were calculated using RAPID software. CONTRAST:  65mL ISOVUE-370 IOPAMIDOL (ISOVUE-370) INJECTION 76% COMPARISON:  Prior CT from earlier the same day. FINDINGS: CTA NECK FINDINGS Aortic arch: Visualized aortic arch ectatic with normal 3 vessel morphology. Mild atheromatous plaque within the aortic arch. No hemodynamically significant stenosis about the origin of the great vessels. Visualized subclavian arteries tortuous proximally but widely patent without stenosis. Right carotid system: Right common carotid artery tortuous proximally but widely patent to the bifurcation. A centric plaque about the bifurcation without hemodynamically significant stenosis. Right ICA mildly tortuous but widely patent to the skull base without stenosis, dissection, or occlusion. Left carotid system: Left common carotid artery tortuous proximally but widely patent to the bifurcation without stenosis. No significant atheromatous narrowing about the left bifurcation. Left ICA tortuous and partially medialized into the retropharyngeal space. Left ICA patent to the skull base without stenosis, dissection, or occlusion. Vertebral arteries: Both of the vertebral arteries arise from the subclavian arteries. Left vertebral artery dominant. Vertebral arteries tortuous proximally but widely patent to the skull base without stenosis, dissection, or  occlusion. Skeleton: No acute osseous abnormality. No worrisome lytic or blastic osseous lesions. Other neck: No acute soft tissue abnormality within the neck. Salivary glands normal. Thyroid normal. No adenopathy. Upper chest: Shotty subcentimeter lymph nodes noted within the visualized upper mediastinum. Scattered atelectatic changes present within the partially visualized lungs. Review of the MIP images confirms the above findings CTA HEAD FINDINGS Anterior circulation: Petrous segments widely patent bilaterally. Mild scattered atheromatous plaque within the cavernous/supraclinoid ICAs without stenosis. ICA termini widely patent. A1 segments patent bilaterally. Left A1 hypoplastic. Normal anterior communicating artery. Anterior cerebral arteries patent to their distal aspects without flow-limiting stenosis. Left M1 patent without stenosis. Normal left MCA bifurcation. There is a proximal left M3 occlusion rising off the inferior division (series 8, image 96). Adjacent inferior M3 branch remains patent. Superior left MCA division remains patent although distally appears attenuated in somewhat irregular. Right M1 mildly irregular and diffusely narrowed without high-grade or flow-limiting stenosis. Normal right MCA bifurcation. No proximal right M2 occlusion. Distal small vessel atheromatous irregularity present throughout the right MCA branches. Posterior circulation: V4 segments patent to the vertebrobasilar junction without flow-limiting stenosis. Left vertebral artery dominant. Posterior inferior cerebral arteries patent bilaterally. Basilar widely patent to its distal aspect without stenosis. Superior cerebral arteries patent  bilaterally. Both of the posterior cerebral arteries supplied via the basilar. Scattered atheromatous irregularity throughout the mid and distal PCAs without high-grade correctable stenosis. Venous sinuses: Not well assessed due to arterial timing of the contrast bolus. Anatomic variants:  None significant.  No aneurysm. Delayed phase: Not performed. Review of the MIP images confirms the above findings CT Brain Perfusion Findings: CBF (<30%) Volume: 41mL Perfusion (Tmax>6.0s) volume: 12mL Mismatch Volume: 12mL Infarction Location:Ischemic core infarct involves the left temporoccipital region. Fairly small amount of surrounding ischemic penumbra. IMPRESSION: 1. Acute left M3 occlusion, likely embolic. Associated core infarct involving the left temporoccipital region with relatively small volume 11 mL surrounding ischemic penumbra. 2. Atheromatous change involving the intracranial circulation, most notable at the right M1 segment and bilateral PCAs. No hemodynamically significant or correctable stenosis identified. 3. Mild for age atherosclerotic change about the carotid bifurcations without high-grade stenosis. 4. Diffuse tortuosity of the major arterial vasculature of the neck, suggesting chronic underlying hypertension. Critical Value/emergent results were called by telephone at the time of interpretation on 09/21/2017 at 5:59 pm to Dr. Cheral Marker, who verbally acknowledged these results. Electronically Signed   By: Jeannine Boga M.D.   On: 09/21/2017 18:30   Ct Cerebral Perfusion W Contrast  Result Date: 09/21/2017 CLINICAL DATA:  Initial evaluation for acute aphasia, stroke. EXAM: CT ANGIOGRAPHY HEAD AND NECK CT PERFUSION BRAIN TECHNIQUE: Multidetector CT imaging of the head and neck was performed using the standard protocol during bolus administration of intravenous contrast. Multiplanar CT image reconstructions and MIPs were obtained to evaluate the vascular anatomy. Carotid stenosis measurements (when applicable) are obtained utilizing NASCET criteria, using the distal internal carotid diameter as the denominator. Multiphase CT imaging of the brain was performed following IV bolus contrast injection. Subsequent parametric perfusion maps were calculated using RAPID software. CONTRAST:  49mL  ISOVUE-370 IOPAMIDOL (ISOVUE-370) INJECTION 76% COMPARISON:  Prior CT from earlier the same day. FINDINGS: CTA NECK FINDINGS Aortic arch: Visualized aortic arch ectatic with normal 3 vessel morphology. Mild atheromatous plaque within the aortic arch. No hemodynamically significant stenosis about the origin of the great vessels. Visualized subclavian arteries tortuous proximally but widely patent without stenosis. Right carotid system: Right common carotid artery tortuous proximally but widely patent to the bifurcation. A centric plaque about the bifurcation without hemodynamically significant stenosis. Right ICA mildly tortuous but widely patent to the skull base without stenosis, dissection, or occlusion. Left carotid system: Left common carotid artery tortuous proximally but widely patent to the bifurcation without stenosis. No significant atheromatous narrowing about the left bifurcation. Left ICA tortuous and partially medialized into the retropharyngeal space. Left ICA patent to the skull base without stenosis, dissection, or occlusion. Vertebral arteries: Both of the vertebral arteries arise from the subclavian arteries. Left vertebral artery dominant. Vertebral arteries tortuous proximally but widely patent to the skull base without stenosis, dissection, or occlusion. Skeleton: No acute osseous abnormality. No worrisome lytic or blastic osseous lesions. Other neck: No acute soft tissue abnormality within the neck. Salivary glands normal. Thyroid normal. No adenopathy. Upper chest: Shotty subcentimeter lymph nodes noted within the visualized upper mediastinum. Scattered atelectatic changes present within the partially visualized lungs. Review of the MIP images confirms the above findings CTA HEAD FINDINGS Anterior circulation: Petrous segments widely patent bilaterally. Mild scattered atheromatous plaque within the cavernous/supraclinoid ICAs without stenosis. ICA termini widely patent. A1 segments patent  bilaterally. Left A1 hypoplastic. Normal anterior communicating artery. Anterior cerebral arteries patent to their distal aspects without flow-limiting stenosis. Left M1 patent without  stenosis. Normal left MCA bifurcation. There is a proximal left M3 occlusion rising off the inferior division (series 8, image 96). Adjacent inferior M3 branch remains patent. Superior left MCA division remains patent although distally appears attenuated in somewhat irregular. Right M1 mildly irregular and diffusely narrowed without high-grade or flow-limiting stenosis. Normal right MCA bifurcation. No proximal right M2 occlusion. Distal small vessel atheromatous irregularity present throughout the right MCA branches. Posterior circulation: V4 segments patent to the vertebrobasilar junction without flow-limiting stenosis. Left vertebral artery dominant. Posterior inferior cerebral arteries patent bilaterally. Basilar widely patent to its distal aspect without stenosis. Superior cerebral arteries patent bilaterally. Both of the posterior cerebral arteries supplied via the basilar. Scattered atheromatous irregularity throughout the mid and distal PCAs without high-grade correctable stenosis. Venous sinuses: Not well assessed due to arterial timing of the contrast bolus. Anatomic variants: None significant.  No aneurysm. Delayed phase: Not performed. Review of the MIP images confirms the above findings CT Brain Perfusion Findings: CBF (<30%) Volume: 31mL Perfusion (Tmax>6.0s) volume: 28mL Mismatch Volume: 8mL Infarction Location:Ischemic core infarct involves the left temporoccipital region. Fairly small amount of surrounding ischemic penumbra. IMPRESSION: 1. Acute left M3 occlusion, likely embolic. Associated core infarct involving the left temporoccipital region with relatively small volume 11 mL surrounding ischemic penumbra. 2. Atheromatous change involving the intracranial circulation, most notable at the right M1 segment and  bilateral PCAs. No hemodynamically significant or correctable stenosis identified. 3. Mild for age atherosclerotic change about the carotid bifurcations without high-grade stenosis. 4. Diffuse tortuosity of the major arterial vasculature of the neck, suggesting chronic underlying hypertension. Critical Value/emergent results were called by telephone at the time of interpretation on 09/21/2017 at 5:59 pm to Dr. Cheral Marker, who verbally acknowledged these results. Electronically Signed   By: Jeannine Boga M.D.   On: 09/21/2017 18:30     LOS: 6 days   Lala Lund, MD  [facility]  If 7PM-7AM, please contact night-coverage  Please page via [email or web address] [email or web address]-click on MD name and type text message  09/27/2017, 11:16 AM

## 2017-09-27 NOTE — Progress Notes (Signed)
Inpatient Rehabilitation  Await updated therapy notes to determine most appropriate post acute rehab venue.  Call if questions.   Carmelia Roller., CCC/SLP Admission Coordinator  [place]  Cell [phone]

## 2017-09-27 NOTE — Progress Notes (Addendum)
Occupational Therapy Treatment Patient Details Name: [name] [name] MRN: [id number] DOB: [birth year] Today's Date: 09/27/2017    History of present illness 84 y.o. female with a PMH of CAD s/p NSTEMI 2015 (medical management), HTN, SVT, CVA with subsequent left eye blindness, who presented to the ED 09/21/17 with confusion, aphasia, and vision loss, and was found to have an acute embolic stroke   OT comments  Pt progressing towards OT goals this session. Pt completing room level functional mobility with overall minA+2 using HHA; requiring maxA for toileting ADLs. Pt noted to have delayed processing, but given increased time and intermittent tactile cues pt following simple commands throughout session and attempting to assist with ADL tasks as able. Pt's current level of assist partly due to Pt's continued visual deficits. Pt also noted to intermittently provide one word responses appropriately when asked a question. Given pt's level of independence prior to this admission, feel pt remains an appropriate candidate for CIR level services at time of discharge. Will continue to follow acutely to progress pt towards established OT goals.    Follow Up Recommendations  CIR;Supervision/Assistance - 24 hour    Equipment Recommendations  3 in 1 bedside commode          Precautions / Restrictions Precautions Precautions: Fall Restrictions Weight Bearing Restrictions: No       Mobility Bed Mobility Overal bed mobility: Needs Assistance Bed Mobility: Rolling Rolling: Min assist   Supine to sit: +2 for physical assistance;Min assist     General bed mobility comments: min assist for initiation of trunk to upright and rotation to EOB. Increased time and effort to perform. Feel visual deficits may be impacting ability to perform task  Transfers Overall transfer level: Needs assistance Equipment used: 2 person hand held assist Transfers: Sit to/from Stand Sit to Stand: +2 physical assistance;Min  assist         General transfer comment: Min assist to power up to stand from bed and from toilet. Increased time and effort for stability and safety    Balance Overall balance assessment: Needs assistance Sitting-balance support: Bilateral upper extremity supported;Feet supported Sitting balance-Leahy Scale: Fair Sitting balance - Comments: min guard assist to sit EOB   Standing balance support: During functional activity;Bilateral upper extremity supported Standing balance-Leahy Scale: Poor Standing balance comment: reliant on external support from therapist                           ADL either performed or assessed with clinical judgement   ADL Overall ADL's : Needs assistance/impaired                         Toilet Transfer: Minimal assistance;+2 for safety/equipment;+2 for physical assistance;Ambulation;Regular Toilet;Grab bars   Toileting- Clothing Manipulation and Hygiene: Moderate assistance;+2 for physical assistance;+2 for safety/equipment;Sit to/from stand Toileting - Clothing Manipulation Details (indicate cue type and reason): pt noted to have soiled brief at start of session; ambulated to bathroom for further toileting; assist for doffing/donning new brief and for peri-care; pt assisting to hold front of gown and attempting to assist with pulling front of brief up when donning new one     Functional mobility during ADLs: Minimal assistance;+2 for physical assistance;+2 for safety/equipment(HHA )       Vision   Additional Comments: pt requires increased assist and guidance for mobility due to low vision, overall maintaining eyes closed this session, though noted to seek grab bar  appropriately when in bathroom for toileting; will continue to assess               Cognition Arousal/Alertness: Awake/alert Behavior During Therapy: Flat affect Overall Cognitive Status: Impaired/Different from baseline Area of Impairment: Following  commands;Safety/judgement;Awareness;Problem solving                       Following Commands: Follows one step commands with increased time   Awareness: Intellectual Problem Solving: Slow processing;Decreased initiation;Difficulty sequencing;Requires verbal cues;Requires tactile cues General Comments: patient demonstrating a 7 to 10 second delay in command following this session but shows improvements in overall consistency and ability to carry out task performance upon command        Exercises     Shoulder Instructions       General Comments      Pertinent Vitals/ Pain       Pain Assessment: Faces Faces Pain Scale: No hurt Pain Intervention(s): Monitored during session  Home Living                                          Prior Functioning/Environment              Frequency  Min 3X/week        Progress Toward Goals  OT Goals(current goals can now be found in the care plan section)  Progress towards OT goals: Progressing toward goals  Acute Rehab OT Goals Patient Stated Goal: none stated OT Goal Formulation: Patient unable to participate in goal setting Time For Goal Achievement: 10/07/17 Potential to Achieve Goals: Good  Plan Discharge plan remains appropriate    Co-evaluation    PT/OT/SLP Co-Evaluation/Treatment: Yes Reason for Co-Treatment: For patient/therapist safety PT goals addressed during session: Mobility/safety with mobility OT goals addressed during session: ADL's and self-care      AM-PAC PT "6 Clicks" Daily Activity     Outcome Measure   Help from another person eating meals?: A Lot Help from another person taking care of personal grooming?: A Lot Help from another person toileting, which includes using toliet, bedpan, or urinal?: A Lot Help from another person bathing (including washing, rinsing, drying)?: A Lot Help from another person to put on and taking off regular upper body clothing?: A Lot Help from  another person to put on and taking off regular lower body clothing?: A Lot 6 Click Score: 12    End of Session Equipment Utilized During Treatment: Gait belt  OT Visit Diagnosis: Other abnormalities of gait and mobility (R26.89);Unsteadiness on feet (R26.81);Other symptoms and signs involving cognitive function;Low vision, both eyes (H54.2);Cognitive communication deficit (R41.841) Symptoms and signs involving cognitive functions: Cerebral infarction   Activity Tolerance Patient tolerated treatment well   Patient Left in chair;with call bell/phone within reach;with chair alarm set;with family/visitor present   Nurse Communication Mobility status        Time: 6803-2122 OT Time Calculation (min): 32 min  Charges: OT General Charges $OT Visit: 1 Visit OT Treatments $Self Care/Home Management : 8-22 mins  Lou Cal, OT Pager [phone] 09/27/2017    Raymondo Band 09/27/2017, 2:02 PM

## 2017-09-27 NOTE — Progress Notes (Signed)
Inpatient Rehabilitation  Reviewed updated therapy notes and discussed anticipated outcomes with daughter, [name].  She expressed concerns about her ability to care for her mom at home given the severity of both her visual and language deficits.  She cared for her mom for about 3 months after her last IP Rehab stay and acknowledged that she knows this would now be long term and she would like to do it of she could.  She will meet with her siblings and requested I initiate insurance authorization in hopes of her ability to make progress so that daughter can care for her again after IP Rehab.  Discussed with nurse case manager and CSW.  Call if questions.   Carmelia Roller., CCC/SLP Admission Coordinator  [place]  Cell [phone]

## 2017-09-27 NOTE — Progress Notes (Signed)
  Speech Language Pathology Treatment: Dysphagia;Cognitive-Linquistic  Patient Details Name: [name] [name] MRN: [id number] DOB: December 22, 1924 Today's Date: 09/27/2017 Time: 5859-2924 SLP Time Calculation (min) (ACUTE ONLY): 19 min  Assessment / Plan / Recommendation Clinical Impression  Skilled observation with intake of PO's including puree/thin via cup/straw with min verbal/tactile cues for bolus size/attention; pt's limited vision requires hand over hand cueing for intake which assists with bolus size as well.  Pt observed to have a delayed throat clearing and immediate cough likely d/t attention during intake; vocal quality remained good throughout trial. Pt's processing delay and mod aphasia require mod verbal/tactile cues during intake/communicative tasks as neologisms present and fluent aphasia within speaking tasks; discussed use of gestures/alternative compensatory strategies, simple language and/or questioning pt to clarify wants/needs with daughter as communication has been significantly impacted since recent CVA; ST will con't to f/u for cognition/aphasia/dysphagia during acute stay; recommend CIR.  HPI HPI: [name] [name] is a 84 y.o. female with medical history significant for coronary artery disease, hypertension, left eye blindness, and history of CVA with residual right-sided visual deficits. Presents with aphasia, confusion, and headache, CTA head and neck reveals acute M3 occlusion with associated infarct in posterior left temporal lobe with 1 mm penumbra. Pt with a witnessed seizure overnight.       SLP Plan  Continue with current plan of care       Recommendations  Diet recommendations: Thin liquid;Dysphagia 1 (puree) Liquids provided via: Cup;Straw Medication Administration: Crushed with puree Supervision: Full supervision/cueing for compensatory strategies;Staff to assist with self feeding Compensations: Minimize environmental distractions;Slow rate;Small sips/bites Postural  Changes and/or Swallow Maneuvers: Seated upright 90 degrees                General recommendations: Rehab consult Oral Care Recommendations: Oral care BID Follow up Recommendations: Inpatient Rehab SLP Visit Diagnosis: Dysphagia, unspecified (R13.10) Plan: Continue with current plan of care                       Elvina Sidle, M.S., CCC-SLP 09/27/2017, 2:32 PM

## 2017-09-27 NOTE — Progress Notes (Signed)
Physical Therapy Treatment Patient Details Name: [name] [name] MRN: [id number] DOB: 1924/10/22 Today's Date: 09/27/2017    History of Present Illness 84 y.o. female with a PMH of CAD s/p NSTEMI 2015 (medical management), HTN, SVT, CVA with subsequent left eye blindness, who presented to the ED 09/21/17 with confusion, aphasia, and vision loss, and was found to have an acute embolic stroke    PT Comments    Patient seen for activity progression with noted improvements in level of alertness, ability to follow commands and carry out task performance. Patient required 2 person assist but was able to ambulate in room. Do feel that visual impairments are affecting mobility. Patient following single steps commands with improved consistency but does show an approximate 7-10 second processing delay at times. Minimally verbal during session but was able to answer appropriately when asked if she was comfortable in the chair as well as other various questions during session. Given improvements in performance and arousal continue to feel comprehensive inpatient therapies would be very beneficial. CIR remains appropriate.  Follow Up Recommendations  CIR     Equipment Recommendations  (TBD)    Recommendations for Other Services Rehab consult     Precautions / Restrictions Precautions Precautions: Fall Restrictions Weight Bearing Restrictions: No    Mobility  Bed Mobility Overal bed mobility: Needs Assistance Bed Mobility: Rolling Rolling: Min assist   Supine to sit: +2 for physical assistance;Min assist     General bed mobility comments: min assist for initiation of trunk to upright and rotation to EOB. Increased time and effort to perform. Feel visual deficits may be impacting ability to perform task  Transfers Overall transfer level: Needs assistance Equipment used: 2 person hand held assist Transfers: Sit to/from Stand;Stand Pivot Transfers Sit to Stand: +2 physical assistance;Min  assist         General transfer comment: Min assist to power up to stand from bed and from toilet. Increased time and effort for stability and safety  Ambulation/Gait Ambulation/Gait assistance: +2 physical assistance;+2 safety/equipment;Min assist;Mod assist Gait Distance (Feet): 24 Feet Assistive device: 2 person hand held assist   Gait velocity: decreased Gait velocity interpretation: <1.31 ft/sec, indicative of household ambulator General Gait Details: very guarded and slow gait, bilateral assist for guidance and sequencing. Patient initially required more assist for balance but once moving progressed to min assist with multi modal cues to compensate for visual deficits   Stairs             Wheelchair Mobility    Modified Rankin (Stroke Patients Only) Modified Rankin (Stroke Patients Only) Pre-Morbid Rankin Score: No symptoms Modified Rankin: Severe disability     Balance Overall balance assessment: Needs assistance Sitting-balance support: Bilateral upper extremity supported;Feet supported Sitting balance-Leahy Scale: Fair Sitting balance - Comments: min guard assist to sit EOB   Standing balance support: During functional activity;Bilateral upper extremity supported Standing balance-Leahy Scale: Poor Standing balance comment: reliant on external support from therapist                            Cognition Arousal/Alertness: Awake/alert Behavior During Therapy: Flat affect Overall Cognitive Status: Impaired/Different from baseline Area of Impairment: Following commands;Safety/judgement;Awareness;Problem solving                       Following Commands: Follows one step commands with increased time   Awareness: Intellectual Problem Solving: Slow processing;Decreased initiation;Difficulty sequencing;Requires verbal cues;Requires tactile cues General Comments:  patient demonstrating a 7 to 10 second delay in command following this session  but shows improvements in overall consistency and ability to carry out task performance upon command      Exercises      General Comments        Pertinent Vitals/Pain Pain Assessment: Faces Faces Pain Scale: No hurt Pain Intervention(s): Monitored during session    Home Living                      Prior Function            PT Goals (current goals can now be found in the care plan section) Acute Rehab PT Goals Patient Stated Goal: none stated PT Goal Formulation: With patient/family Time For Goal Achievement: 10/07/17 Potential to Achieve Goals: Good Progress towards PT goals: Progressing toward goals    Frequency    Min 4X/week      PT Plan Current plan remains appropriate    Co-evaluation PT/OT/SLP Co-Evaluation/Treatment: Yes Reason for Co-Treatment: For patient/therapist safety PT goals addressed during session: Mobility/safety with mobility OT goals addressed during session: ADL's and self-care      AM-PAC PT "6 Clicks" Daily Activity  Outcome Measure  Difficulty turning over in bed (including adjusting bedclothes, sheets and blankets)?: Unable Difficulty moving from lying on back to sitting on the side of the bed? : Unable Difficulty sitting down on and standing up from a chair with arms (e.g., wheelchair, bedside commode, etc,.)?: Unable Help needed moving to and from a bed to chair (including a wheelchair)?: A Little Help needed walking in hospital room?: A Lot Help needed climbing 3-5 steps with a railing? : A Lot 6 Click Score: 10    End of Session Equipment Utilized During Treatment: Gait belt Activity Tolerance: Patient tolerated treatment well Patient left: in chair;with call bell/phone within reach;with chair alarm set;with family/visitor present Nurse Communication: Mobility status PT Visit Diagnosis: Difficulty in walking, not elsewhere classified (R26.2);Other symptoms and signs involving the nervous system (R29.898)      Time: 5945-8592 PT Time Calculation (min) (ACUTE ONLY): 32 min  Charges:  $Therapeutic Activity: 8-22 mins                    G Codes:       Alben Deeds, PT DPT  Board Certified Neurologic Specialist [place] 09/27/2017, 1:26 PM

## 2017-09-28 LAB — CBC
HCT: 47 % — ABNORMAL HIGH (ref 36.0–46.0)
Hemoglobin: 14.9 g/dL (ref 12.0–15.0)
MCH: 30.3 pg (ref 26.0–34.0)
MCHC: 31.7 g/dL (ref 30.0–36.0)
MCV: 95.5 fL (ref 78.0–100.0)
PLATELETS: 176 10*3/uL (ref 150–400)
RBC: 4.92 MIL/uL (ref 3.87–5.11)
RDW: 13.1 % (ref 11.5–15.5)
WBC: 5 10*3/uL (ref 4.0–10.5)

## 2017-09-28 LAB — BASIC METABOLIC PANEL
Anion gap: 10 (ref 5–15)
BUN: 10 mg/dL (ref 8–23)
CALCIUM: 9 mg/dL (ref 8.9–10.3)
CO2: 29 mmol/L (ref 22–32)
Chloride: 102 mmol/L (ref 98–111)
Creatinine, Ser: 0.56 mg/dL (ref 0.44–1.00)
GLUCOSE: 106 mg/dL — AB (ref 70–99)
Potassium: 3.2 mmol/L — ABNORMAL LOW (ref 3.5–5.1)
Sodium: 141 mmol/L (ref 135–145)

## 2017-09-28 MED ORDER — LACOSAMIDE 100 MG PO TABS: 100.0000 mg | ORAL_TABLET | Freq: Two times a day (BID) | ORAL | Status: DC

## 2017-09-28 MED ORDER — POTASSIUM CHLORIDE 10 MEQ/100ML IV SOLN
10.0000 meq | INTRAVENOUS | Status: AC
  Administered 2017-09-28 (×4): 10 meq via INTRAVENOUS
  Filled 2017-09-28 (×4): qty 100

## 2017-09-28 MED ORDER — PRAVASTATIN SODIUM 20 MG PO TABS: 20.0000 mg | ORAL_TABLET | Freq: Every day | ORAL | Status: DC

## 2017-09-28 MED ORDER — ARTIFICIAL TEARS OPHTHALMIC OINT: 1.0000 "application " | TOPICAL_OINTMENT | Freq: Every day | OPHTHALMIC | Status: DC

## 2017-09-28 MED ORDER — DILTIAZEM HCL 60 MG PO TABS: 60.0000 mg | ORAL_TABLET | Freq: Four times a day (QID) | ORAL | Status: DC

## 2017-09-28 MED ORDER — APIXABAN 5 MG PO TABS: 5.0000 mg | ORAL_TABLET | Freq: Two times a day (BID) | ORAL | Status: DC

## 2017-09-28 NOTE — Discharge Summary (Signed)
[name] [name] HYW:[id number] DOB: 1924-07-03 DOA: 09/21/2017  PCP: Darcus Austin, MD  Admit date: 09/21/2017  Discharge date: 09/28/2017  Admitted From: Home   Disposition:  CIR/SNF   Recommendations for Outpatient Follow-up:   Follow up with PCP in 1-2 weeks  PCP Please obtain BMP/CBC, 2 view CXR in 1week,  (see Discharge instructions)   PCP Please follow up on the following pending results:    Home Health: None   Equipment/Devices: None  Consultations: Neuro, Cards Discharge Condition: Fair   CODE STATUS: Full   Diet Recommendation: Dysphagia 1 diet   Chief Complaint  Patient presents with  . Aphasia  . Headache     Brief history of present illness from the day of admission and additional interim summary    Patient is a 84 y.o. female with history of left eye blindness (corneal scarring), prior history of CVA on Plavix (loop recorder implanted-SVT detected as outpatient), presented with confusion, visual disturbances, sensory aphasia.  CT angiogram brain and CT perfusion study confirmed a left M3 occlusion with acute infarct involving the temporal cortical area.  She was admitted to the hospitalist service, but then developed generalized tonic-clonic seizure and A. fib with RVR.  She was started on IV Keppra, she spontaneously converted back to sinus rhythm.  Hospital course was complicated by delirium-and excessive sedation after using benzodiazepines (had seizure) and narcotics.  See below for further details                                                                   Hospital Course    Acute CVA: Thought to be embolic-atrial fibrillation confirmed on EKG and telemetry monitoring this admission.  CT angiogram of the neck did not show any stenosis, 2D echocardiogram with preserved EF and without any  embolic source.  LDL 95, A1c 5.6.  She unfortunately she was already blind in her left eye from corneal scarring, she now appears to have significant amount of right eye cortical blindness and motor aphasia from acute CVA.  She is able to move all 4 extremities.  Currently on aspirin, and high intensity statin, as per neurology start Eliquis on 09/29/2017 and continue with high intensity statin.    Long-term prognosis does not look good due to advanced age, now almost totally blind as she had left eye blindness from before acute vision loss in the right eye, deconditioning and other comorbidities.  Explained this to the son & daughter bedside in detail on 09/26/2017.  For now full code.  Acute metabolic encephalopathy: Initially felt to be from postictal state and Ativan that she received yesterday morning.  She became very delirious on 7/4 night and received fentanyl.  This a.m.-she is much better-,-following some commands.  EEG on 7/3 negative for seizures, ABG without hypercarbia. Minimize  benzodiazepines and Ativan.    PAF with [place] vas 2 score of 7: Maintaining sinus rhythm since yesterday-remains on low-dose IV Lopressor for rate control.  Been started on low-dose oral Bystolic, start Eliquis on 09/29/2017.  History of paroxysmal SVT: Documented on loop recorder data (loop recorder implanted after CVA)-continue telemetry monitoring.  Remains on beta-blockers  Dysphasia due to combination of CVA, blindness and metabolic encephalopathy.  Speech following.  Currently on dysphagia 1 diet, remains high risk for aspiration.  We will continue to monitor.    Generalized tonic-clonic seizure: Occurred on 7/3-after she was admitted-usually started on Keppra-she still is somewhat sedated this morning-I have switched over to Vimpat. EEG on 7/3 did not show seizure-like activity.  Neurology following.  Clinically stable continue Vimpat upon discharge.  Hypertension: Allow some amount of permissive  hypertension-tolerating low-dose IV Lopressor to control her heart rate.  Low-dose oral beta-blocker continued.  May increase beta-blocker dose around 09/30/2017 if needed to regulate blood pressure tightly.  CAD: Currently without any anginal symptoms.  Left eye blindness: She has corneal scarring-unfortunately she may have developed significant visual deficits in her right side from this acute CVA.  Hypokalemia.  Replaced IV and stable.    Discharge diagnosis     Principal Problem:   Cerebral embolism with cerebral infarction Active Problems:   History of PSVT   Headache   Essential hypertension   CAD (coronary artery disease)   Blind   Acute CVA (cerebrovascular accident) ([place])   Seizures ([place])   PAF (paroxysmal atrial fibrillation) ([place])   Diastolic dysfunction   Hypokalemia    Discharge instructions    Discharge Instructions    Ambulatory referral to Neurology   Complete by:  As directed    Follow up with stroke clinic NP (Jessica Vanschaick or Cecille Rubin, if both not available, consider Dr. Antony Contras, Dr. Bess Harvest, or Dr. Sarina Ill) at [facility] in about 4 weeks. - anticipate d/c to rehab or SNF in a few days   Discharge instructions   Complete by:  As directed    Follow with Primary MD Darcus Austin, MD in 7 days   Get CBC, CMP,  checked  by Primary MD or SNF MD in 5-7 days   Activity: As tolerated with Full fall precautions use walker/cane & assistance as needed  Disposition CIR/SNF  Diet:   Dysphagia 1 diet with feeding assistance and aspiration precautions.  For Heart failure patients - Check your Weight same time everyday, if you gain over 2 pounds, or you develop in leg swelling, experience more shortness of breath or chest pain, call your Primary MD immediately. Follow Cardiac Low Salt Diet and 1.5 lit/day fluid restriction.   Increase activity slowly   Complete by:  As directed       Discharge Medications    Allergies as of 09/28/2017      Reactions   Contrast Media [iodinated Diagnostic Agents] Anaphylaxis   Fluorescein Anaphylaxis, Shortness Of Breath, Itching, Swelling   Hydralazine Hcl Anaphylaxis, Swelling, Palpitations, Rash   Sulfa Antibiotics Rash, Other (See Comments)   Other Reaction: red bumps   Ultram [tramadol] Hives   Yellow Dye Anaphylaxis, Itching, Swelling, Other (See Comments)   Fluorescein (in eye drops)   Buprenex [buprenorphine Hcl] Palpitations, Other (See Comments)   Keflex [cephalexin] Diarrhea   Lipitor [atorvastatin] Other (See Comments)   Muscle weakness   Restasis [cyclosporine] Swelling   redness of face with slight swelling    Augmentin [amoxicillin-pot Clavulanate]  Nausea And Vomiting   Levofloxacin Other (See Comments)   Reaction not recalled   Morphine And Related Other (See Comments)   agitation    Percocet [oxycodone-acetaminophen] Nausea And Vomiting   Tape Other (See Comments)   Adhesive tape pulls off the skin Able to tolerate paper tape   Zetia [ezetimibe] Other (See Comments)   Myalgias      Medication List    STOP taking these medications   SYSTANE 0.4-0.3 % Gel ophthalmic gel Generic drug:  Polyethyl Glycol-Propyl Glycol Replaced by:  artificial tears Oint ophthalmic ointment You also have another medication with the same name that you need to continue taking as instructed.     TAKE these medications   apixaban 5 MG Tabs tablet Commonly known as:  ELIQUIS Take 1 tablet (5 mg total) by mouth 2 (two) times daily. Start taking on:  09/29/2017   artificial tears Oint ophthalmic ointment Commonly known as:  [place] 1 application into both eyes at bedtime. Replaces:  SYSTANE 0.4-0.3 % Gel ophthalmic gel   diltiazem 60 MG tablet Commonly known as:  CARDIZEM Take 1 tablet (60 mg total) by mouth every 6 (six) hours.   Lacosamide 100 MG Tabs Commonly known as:  VIMPAT Take 1 tablet (100 mg total) by mouth 2 (two) times  daily.   Magnesium 250 MG Tabs Take 250 mg by mouth at bedtime.   Melatonin 1 MG Subl Place 1 mg under the tongue at bedtime.   MULTI VITAMIN DAILY PO Take 1 tablet by mouth every morning.   nebivolol 2.5 MG tablet Commonly known as:  BYSTOLIC Take 1 tablet (2.5 mg total) by mouth 2 (two) times daily.   nitroGLYCERIN 0.4 MG SL tablet Commonly known as:  NITROSTAT Place 1 tablet (0.4 mg total) under the tongue every 5 (five) minutes as needed for chest pain.   pantoprazole 40 MG tablet Commonly known as:  PROTONIX Take 40 mg by mouth daily.   pravastatin 20 MG tablet Commonly known as:  PRAVACHOL Take 1 tablet (20 mg total) by mouth daily at 6 PM.   prednisoLONE acetate 1 % ophthalmic suspension Commonly known as:  PRED FORTE Place 1 drop into both eyes See admin instructions. 1 drop into both eyes at bedtime spaced 5 minutes apart from Systane gel   PRESCRIPTION MEDICATION Place 1 drop into the right eye 4 (four) times daily. [place] [id number]   Laray Anger MD  09/13/17 Serum, Autologous 50% drop   PRESERVISION AREDS 2 Caps Take 1 capsule by mouth 2 (two) times daily.   SYSTANE ULTRA 0.4-0.3 % Soln Generic drug:  Polyethyl Glycol-Propyl Glycol Apply 1 drop to eye See admin instructions. Twice daily as needed and at bedtime for irritation What changed:  Another medication with the same name was removed. Continue taking this medication, and follow the directions you see here.       Follow-up Information    [name], MD Follow up on 12/01/2017.   Specialty:  Cardiology Why:  Please arrive 15 minutes early for your 3:20pm appointment. Contact information: [address] [place] [address] [phone]        [facility] Follow up in 4 week(s).   Specialty:  Neurology Why:  stroke clinic. office will call with appt date and time. Contact information: [address] [place] [phone]       Darcus Austin, MD. Schedule an appointment as soon as  possible for a visit in 1 week(s).   Specialty:  Family Medicine Contact information: [place] [address] [phone]           Major procedures and Radiology Reports - PLEASE review detailed and final reports thoroughly  -        Ct Angio Head W Or Wo Contrast  Result Date: 09/21/2017 CLINICAL DATA:  Initial evaluation for acute aphasia, stroke. EXAM: CT ANGIOGRAPHY HEAD AND NECK CT PERFUSION BRAIN TECHNIQUE: Multidetector CT imaging of the head and neck was performed using the standard protocol during bolus administration of intravenous contrast. Multiplanar CT image reconstructions and MIPs were obtained to evaluate the vascular anatomy. Carotid stenosis measurements (when applicable) are obtained utilizing NASCET criteria, using the distal internal carotid diameter as the denominator. Multiphase CT imaging of the brain was performed following IV bolus contrast injection. Subsequent parametric perfusion maps were calculated using RAPID software. CONTRAST:  48mL ISOVUE-370 IOPAMIDOL (ISOVUE-370) INJECTION 76% COMPARISON:  Prior CT from earlier the same day. FINDINGS: CTA NECK FINDINGS Aortic arch: Visualized aortic arch ectatic with normal 3 vessel morphology. Mild atheromatous plaque within the aortic arch. No hemodynamically significant stenosis about the origin of the great vessels. Visualized subclavian arteries tortuous proximally but widely patent without stenosis. Right carotid system: Right common carotid artery tortuous proximally but widely patent to the bifurcation. A centric plaque about the bifurcation without hemodynamically significant stenosis. Right ICA mildly tortuous but widely patent to the skull base without stenosis, dissection, or occlusion. Left carotid system: Left common carotid artery tortuous proximally but widely patent to the bifurcation  without stenosis. No significant atheromatous narrowing about the left bifurcation. Left ICA tortuous and partially medialized into the retropharyngeal space. Left ICA patent to the skull base without stenosis, dissection, or occlusion. Vertebral arteries: Both of the vertebral arteries arise from the subclavian arteries. Left vertebral artery dominant. Vertebral arteries tortuous proximally but widely patent to the skull base without stenosis, dissection, or occlusion. Skeleton: No acute osseous abnormality. No worrisome lytic or blastic osseous lesions. Other neck: No acute soft tissue abnormality within the neck. Salivary glands normal. Thyroid normal. No adenopathy. Upper chest: Shotty subcentimeter lymph nodes noted within the visualized upper mediastinum. Scattered atelectatic changes present within the partially visualized lungs. Review of the MIP images confirms the above findings CTA HEAD FINDINGS Anterior circulation: Petrous segments widely patent bilaterally. Mild scattered atheromatous plaque within the cavernous/supraclinoid ICAs without stenosis. ICA termini widely patent. A1 segments patent bilaterally. Left A1 hypoplastic. Normal anterior communicating artery. Anterior cerebral arteries patent to their distal aspects without flow-limiting stenosis. Left M1 patent without stenosis. Normal left MCA bifurcation. There is a proximal left M3 occlusion rising off the inferior division (series 8, image 96). Adjacent inferior M3 branch remains patent. Superior left MCA division remains patent although distally appears attenuated in somewhat irregular. Right M1 mildly irregular and diffusely narrowed without high-grade or flow-limiting stenosis. Normal right MCA bifurcation. No proximal right M2 occlusion. Distal small vessel atheromatous irregularity present throughout the right MCA branches. Posterior circulation: V4 segments patent to the vertebrobasilar junction without flow-limiting stenosis. Left  vertebral artery dominant. Posterior inferior cerebral arteries patent bilaterally. Basilar widely patent to its distal aspect without stenosis. Superior cerebral arteries patent bilaterally. Both of the posterior cerebral arteries supplied via the basilar. Scattered atheromatous irregularity throughout the mid and distal PCAs without high-grade correctable stenosis. Venous sinuses: Not well assessed due to arterial timing of the contrast bolus. Anatomic variants: None significant.  No aneurysm. Delayed phase: Not performed. Review of the MIP images confirms the above findings CT Brain Perfusion Findings: CBF (<30%) Volume: 5mL Perfusion (Tmax>6.0s) volume: 45mL Mismatch Volume: 46mL Infarction Location:Ischemic core infarct involves the left temporoccipital region. Fairly small amount of surrounding ischemic penumbra. IMPRESSION: 1. Acute left M3 occlusion, likely embolic. Associated core infarct involving the left temporoccipital region with relatively small volume 11 mL surrounding ischemic penumbra. 2. Atheromatous change involving the intracranial circulation, most notable at the right M1 segment and bilateral PCAs. No hemodynamically significant or correctable stenosis identified. 3. Mild for age atherosclerotic change about the carotid bifurcations without high-grade stenosis. 4. Diffuse tortuosity of the major arterial vasculature of the neck, suggesting chronic underlying hypertension. Critical Value/emergent results were called by telephone at the time of interpretation on 09/21/2017 at 5:59 pm to Dr. Cheral Marker, who verbally acknowledged these results. Electronically Signed   By: Jeannine Boga M.D.   On: 09/21/2017 18:30   Ct Head Wo Contrast  Result Date: 09/21/2017 CLINICAL DATA:  Altered mental status. EXAM: CT HEAD WITHOUT CONTRAST TECHNIQUE: Contiguous axial images were obtained from the base of the skull through the vertex without intravenous contrast. COMPARISON:  CT head and MRI brain  03/18/2017. FINDINGS: Brain: There is a new large area of encephalomalacia within the right occipital lobe consistent with an interval stroke (axial images 13 through 17/2). No evidence of acute intracranial hemorrhage, mass lesion, brain edema or extra-axial fluid collection. Patchy low-density in the periventricular white matter appears stable. The ventricles and subarachnoid spaces are appropriately sized for age. There is no CT evidence of acute cortical infarction. Vascular: Intracranial vascular calcifications. No hyperdense vessel identified. Skull: Negative for fracture or focal lesion. Sinuses/Orbits: The visualized paranasal sinuses and mastoid air cells are clear. Hypoplastic frontal sinuses. No orbital abnormalities are seen. Other: None. IMPRESSION: 1. No acute intracranial findings. 2. Interval development of a large area of encephalomalacia in the right occipital lobe consistent with a stroke sustained over the last 6 months. This has no definite acute components but could be further evaluated with MRI if clinically warranted. Electronically Signed   By: Richardean Sale M.D.   On: 09/21/2017 14:58   Ct Angio Neck W Or Wo Contrast  Result Date: 09/21/2017 CLINICAL DATA:  Initial evaluation for acute aphasia, stroke. EXAM: CT ANGIOGRAPHY HEAD AND NECK CT PERFUSION BRAIN TECHNIQUE: Multidetector CT imaging of the head and neck was performed using the standard protocol during bolus administration of intravenous contrast. Multiplanar CT image reconstructions and MIPs were obtained to evaluate the vascular anatomy. Carotid stenosis measurements (when applicable) are obtained utilizing NASCET criteria, using the distal internal carotid diameter as the denominator. Multiphase CT imaging of the brain was performed following IV bolus contrast injection. Subsequent parametric perfusion maps were calculated using RAPID software. CONTRAST:  63mL ISOVUE-370 IOPAMIDOL (ISOVUE-370) INJECTION 76% COMPARISON:  Prior  CT from earlier the same day. FINDINGS: CTA NECK FINDINGS Aortic arch: Visualized aortic arch ectatic with normal 3 vessel morphology. Mild atheromatous plaque within the aortic arch. No hemodynamically significant stenosis about the origin of the great vessels. Visualized subclavian arteries tortuous proximally but widely patent without stenosis. Right carotid system: Right common carotid artery tortuous proximally but widely patent to the bifurcation. A centric plaque about the bifurcation without hemodynamically significant stenosis. Right ICA mildly tortuous but widely patent to the skull base without stenosis, dissection, or occlusion. Left carotid system: Left common carotid artery tortuous proximally but widely patent to the bifurcation without stenosis. No significant  atheromatous narrowing about the left bifurcation. Left ICA tortuous and partially medialized into the retropharyngeal space. Left ICA patent to the skull base without stenosis, dissection, or occlusion. Vertebral arteries: Both of the vertebral arteries arise from the subclavian arteries. Left vertebral artery dominant. Vertebral arteries tortuous proximally but widely patent to the skull base without stenosis, dissection, or occlusion. Skeleton: No acute osseous abnormality. No worrisome lytic or blastic osseous lesions. Other neck: No acute soft tissue abnormality within the neck. Salivary glands normal. Thyroid normal. No adenopathy. Upper chest: Shotty subcentimeter lymph nodes noted within the visualized upper mediastinum. Scattered atelectatic changes present within the partially visualized lungs. Review of the MIP images confirms the above findings CTA HEAD FINDINGS Anterior circulation: Petrous segments widely patent bilaterally. Mild scattered atheromatous plaque within the cavernous/supraclinoid ICAs without stenosis. ICA termini widely patent. A1 segments patent bilaterally. Left A1 hypoplastic. Normal anterior communicating artery.  Anterior cerebral arteries patent to their distal aspects without flow-limiting stenosis. Left M1 patent without stenosis. Normal left MCA bifurcation. There is a proximal left M3 occlusion rising off the inferior division (series 8, image 96). Adjacent inferior M3 branch remains patent. Superior left MCA division remains patent although distally appears attenuated in somewhat irregular. Right M1 mildly irregular and diffusely narrowed without high-grade or flow-limiting stenosis. Normal right MCA bifurcation. No proximal right M2 occlusion. Distal small vessel atheromatous irregularity present throughout the right MCA branches. Posterior circulation: V4 segments patent to the vertebrobasilar junction without flow-limiting stenosis. Left vertebral artery dominant. Posterior inferior cerebral arteries patent bilaterally. Basilar widely patent to its distal aspect without stenosis. Superior cerebral arteries patent bilaterally. Both of the posterior cerebral arteries supplied via the basilar. Scattered atheromatous irregularity throughout the mid and distal PCAs without high-grade correctable stenosis. Venous sinuses: Not well assessed due to arterial timing of the contrast bolus. Anatomic variants: None significant.  No aneurysm. Delayed phase: Not performed. Review of the MIP images confirms the above findings CT Brain Perfusion Findings: CBF (<30%) Volume: 52mL Perfusion (Tmax>6.0s) volume: 61mL Mismatch Volume: 60mL Infarction Location:Ischemic core infarct involves the left temporoccipital region. Fairly small amount of surrounding ischemic penumbra. IMPRESSION: 1. Acute left M3 occlusion, likely embolic. Associated core infarct involving the left temporoccipital region with relatively small volume 11 mL surrounding ischemic penumbra. 2. Atheromatous change involving the intracranial circulation, most notable at the right M1 segment and bilateral PCAs. No hemodynamically significant or correctable stenosis  identified. 3. Mild for age atherosclerotic change about the carotid bifurcations without high-grade stenosis. 4. Diffuse tortuosity of the major arterial vasculature of the neck, suggesting chronic underlying hypertension. Critical Value/emergent results were called by telephone at the time of interpretation on 09/21/2017 at 5:59 pm to Dr. Cheral Marker, who verbally acknowledged these results. Electronically Signed   By: Jeannine Boga M.D.   On: 09/21/2017 18:30   Ct Cerebral Perfusion W Contrast  Result Date: 09/21/2017 CLINICAL DATA:  Initial evaluation for acute aphasia, stroke. EXAM: CT ANGIOGRAPHY HEAD AND NECK CT PERFUSION BRAIN TECHNIQUE: Multidetector CT imaging of the head and neck was performed using the standard protocol during bolus administration of intravenous contrast. Multiplanar CT image reconstructions and MIPs were obtained to evaluate the vascular anatomy. Carotid stenosis measurements (when applicable) are obtained utilizing NASCET criteria, using the distal internal carotid diameter as the denominator. Multiphase CT imaging of the brain was performed following IV bolus contrast injection. Subsequent parametric perfusion maps were calculated using RAPID software. CONTRAST:  63mL ISOVUE-370 IOPAMIDOL (ISOVUE-370) INJECTION 76% COMPARISON:  Prior CT from earlier  the same day. FINDINGS: CTA NECK FINDINGS Aortic arch: Visualized aortic arch ectatic with normal 3 vessel morphology. Mild atheromatous plaque within the aortic arch. No hemodynamically significant stenosis about the origin of the great vessels. Visualized subclavian arteries tortuous proximally but widely patent without stenosis. Right carotid system: Right common carotid artery tortuous proximally but widely patent to the bifurcation. A centric plaque about the bifurcation without hemodynamically significant stenosis. Right ICA mildly tortuous but widely patent to the skull base without stenosis, dissection, or occlusion. Left  carotid system: Left common carotid artery tortuous proximally but widely patent to the bifurcation without stenosis. No significant atheromatous narrowing about the left bifurcation. Left ICA tortuous and partially medialized into the retropharyngeal space. Left ICA patent to the skull base without stenosis, dissection, or occlusion. Vertebral arteries: Both of the vertebral arteries arise from the subclavian arteries. Left vertebral artery dominant. Vertebral arteries tortuous proximally but widely patent to the skull base without stenosis, dissection, or occlusion. Skeleton: No acute osseous abnormality. No worrisome lytic or blastic osseous lesions. Other neck: No acute soft tissue abnormality within the neck. Salivary glands normal. Thyroid normal. No adenopathy. Upper chest: Shotty subcentimeter lymph nodes noted within the visualized upper mediastinum. Scattered atelectatic changes present within the partially visualized lungs. Review of the MIP images confirms the above findings CTA HEAD FINDINGS Anterior circulation: Petrous segments widely patent bilaterally. Mild scattered atheromatous plaque within the cavernous/supraclinoid ICAs without stenosis. ICA termini widely patent. A1 segments patent bilaterally. Left A1 hypoplastic. Normal anterior communicating artery. Anterior cerebral arteries patent to their distal aspects without flow-limiting stenosis. Left M1 patent without stenosis. Normal left MCA bifurcation. There is a proximal left M3 occlusion rising off the inferior division (series 8, image 96). Adjacent inferior M3 branch remains patent. Superior left MCA division remains patent although distally appears attenuated in somewhat irregular. Right M1 mildly irregular and diffusely narrowed without high-grade or flow-limiting stenosis. Normal right MCA bifurcation. No proximal right M2 occlusion. Distal small vessel atheromatous irregularity present throughout the right MCA branches. Posterior  circulation: V4 segments patent to the vertebrobasilar junction without flow-limiting stenosis. Left vertebral artery dominant. Posterior inferior cerebral arteries patent bilaterally. Basilar widely patent to its distal aspect without stenosis. Superior cerebral arteries patent bilaterally. Both of the posterior cerebral arteries supplied via the basilar. Scattered atheromatous irregularity throughout the mid and distal PCAs without high-grade correctable stenosis. Venous sinuses: Not well assessed due to arterial timing of the contrast bolus. Anatomic variants: None significant.  No aneurysm. Delayed phase: Not performed. Review of the MIP images confirms the above findings CT Brain Perfusion Findings: CBF (<30%) Volume: 47mL Perfusion (Tmax>6.0s) volume: 72mL Mismatch Volume: 68mL Infarction Location:Ischemic core infarct involves the left temporoccipital region. Fairly small amount of surrounding ischemic penumbra. IMPRESSION: 1. Acute left M3 occlusion, likely embolic. Associated core infarct involving the left temporoccipital region with relatively small volume 11 mL surrounding ischemic penumbra. 2. Atheromatous change involving the intracranial circulation, most notable at the right M1 segment and bilateral PCAs. No hemodynamically significant or correctable stenosis identified. 3. Mild for age atherosclerotic change about the carotid bifurcations without high-grade stenosis. 4. Diffuse tortuosity of the major arterial vasculature of the neck, suggesting chronic underlying hypertension. Critical Value/emergent results were called by telephone at the time of interpretation on 09/21/2017 at 5:59 pm to Dr. Cheral Marker, who verbally acknowledged these results. Electronically Signed   By: Jeannine Boga M.D.   On: 09/21/2017 18:30    Micro Results     No results found  for this or any previous visit (from the past 240 hour(s)).  Today   Subjective    Darlisha Kelm is in bed sleeping, was agitated and  received Haldol early this morning and is sleeping, family wants her to rest.   Objective   Blood pressure (!) 161/78, pulse 78, temperature 98.3 F (36.8 C), temperature source Oral, resp. rate 18, height 5\' 3"  (1.6 m), weight 75.9 kg (167 lb 5.3 oz), SpO2 98 %.   Intake/Output Summary (Last 24 hours) at 09/28/2017 1105 Last data filed at 09/28/2017 0951 Gross per 24 hour  Intake 390 ml  Output -  Net 390 ml    Exam Sleeping, in no distress,  [place].AT,PERRAL Supple Neck,No JVD, No cervical lymphadenopathy appriciated.  Symmetrical Chest wall movement, Good air movement bilaterally, CTAB RRR,No Gallops,Rubs or new Murmurs, No Parasternal Heave +ve B.Sounds, Abd Soft, Non tender, No organomegaly appriciated, No rebound -guarding or rigidity. No Cyanosis, Clubbing or edema, No new Rash or bruise   Data Review   CBC w Diff:  Lab Results  Component Value Date   WBC 5.0 09/28/2017   HGB 14.9 09/28/2017   HGB 14.2 08/10/2017   HCT 47.0 (H) 09/28/2017   HCT 42.4 08/10/2017   PLT 176 09/28/2017   PLT 231 08/10/2017   LYMPHOPCT 12 09/21/2017   MONOPCT 8 09/21/2017   EOSPCT 0 09/21/2017   BASOPCT 1 09/21/2017    CMP:  Lab Results  Component Value Date   NA 141 09/28/2017   NA 140 08/10/2017   K 3.2 (L) 09/28/2017   CL 102 09/28/2017   CO2 29 09/28/2017   BUN 10 09/28/2017   BUN 20 08/10/2017   CREATININE 0.56 09/28/2017   PROT 6.5 09/21/2017   ALBUMIN 3.9 09/21/2017   BILITOT 0.7 09/21/2017   ALKPHOS 94 09/21/2017   AST 31 09/21/2017   ALT 25 09/21/2017  .  Lab Results  Component Value Date   HGBA1C 5.6 09/22/2017   Lab Results  Component Value Date   CHOL 164 09/22/2017   HDL 58 09/22/2017   LDLCALC 95 09/22/2017   TRIG 54 09/22/2017   CHOLHDL 2.8 09/22/2017    Total Time in preparing paper work, data evaluation and todays exam - 57 minutes  Lala Lund M.D on 09/28/2017 at 11:05 AM  [facility]   Office  [phone]

## 2017-09-28 NOTE — Progress Notes (Signed)
Inpatient Rehabilitation  Received a denial from [facility], due to patient being more appropriate for SNF level of post acute rehab.  Notified daughter at bedside and team.  Will sign off at this time.  Call if questions.   Carmelia Roller., CCC/SLP Admission Coordinator  [place]  Cell [phone]

## 2017-09-28 NOTE — Progress Notes (Signed)
  Speech Language Pathology Treatment: Cognitive-Linquistic;Dysphagia  Patient Details Name: [name] [name] MRN: [id number] DOB: 02/23/1925 Today's Date: 09/28/2017 Time: 5697-9480 SLP Time Calculation (min) (ACUTE ONLY): 28 min  Assessment / Plan / Recommendation Clinical Impression  Pt was seen for skilled ST targeting goals for communication and dysphagia.  Pt had delayed responses when answering therapist's questions, even with simple social exchanges.  When pt did verbalize, it was fluent jargon and pt had little awareness of her verbal errors despite max to total multimodal cues.  Pt was able to feed herself a functional snack of dys 1, thin liquids following initial hand over hand assist initiation and sequencing.   Pt had immediate coughing following both cup and straw sips of thin liquids which daughter reports to be baseline.  Pt remains afebrile, sats WFL on room air, and lungs clear to auscultation per chart review.  Therefore, recommend that pt remain on currently prescribed diet with close ST follow up for management of safe diet progression.  Pt was left in bed with bed alarm set and daughter at bedside.  Continue per current plan of care.    HPI HPI: [name] [name] is a 84 y.o. female with medical history significant for coronary artery disease, hypertension, left eye blindness, and history of CVA with residual right-sided visual deficits. Presents with aphasia, confusion, and headache, CTA head and neck reveals acute M3 occlusion with associated infarct in posterior left temporal lobe with 1 mm penumbra. Pt with a witnessed seizure overnight.       SLP Plan  Continue with current plan of care       Recommendations  Diet recommendations: Dysphagia 1 (puree);Thin liquid Liquids provided via: Cup;Straw Medication Administration: Crushed with puree Supervision: Full supervision/cueing for compensatory strategies;Staff to assist with self feeding Compensations: Minimize environmental  distractions;Slow rate;Small sips/bites Postural Changes and/or Swallow Maneuvers: Seated upright 90 degrees                Oral Care Recommendations: Oral care BID Follow up Recommendations: Inpatient Rehab;Skilled Nursing facility SLP Visit Diagnosis: Dysphagia, unspecified (R13.10) Plan: Continue with current plan of care       GO                PageSelinda [name] 09/28/2017, 3:37 PM

## 2017-09-28 NOTE — NC FL2 (Signed)
[place] MEDICAID FL2 LEVEL OF CARE SCREENING TOOL     IDENTIFICATION  Patient Name: [name] [name] Birthdate: [birth year] Sex: female Admission Date (Current Location): 09/21/2017  [facility] and [place] Number:  Herbalist and Address:  [facility], [place] [address], [place], [place] [address]      Provider Number: [id number]  Attending Physician Name and Address:  Thurnell Lose, MD  Relative Name and Phone Number:       Current Level of Care: Hospital Recommended Level of Care: [place] Prior Approval Number:    Date Approved/Denied:   PASRR Number: [id number]  Discharge Plan: SNF    Current Diagnoses: Patient Active Problem List   Diagnosis Date Noted  . Diastolic dysfunction   . Hypokalemia   . Seizures ([place])   . PAF (paroxysmal atrial fibrillation) ([place])   . Cerebral embolism with cerebral infarction 09/21/2017  . Acute CVA (cerebrovascular accident) ([place]) 09/21/2017  . Blind 04/02/2017  . Visual field cut- right 04/02/2017  . Gastroesophageal reflux disease   . Pre-diabetes   . Blind left eye- (Fuch's disease) 03/05/2015  . Essential hypertension 12/19/2014  . CAD (coronary artery disease) 12/19/2014  . History of Rt brain stroke Oct 2015 06/14/2014  . Headache 06/14/2014  . Malignant hypertension 01/20/2014  . Aphasia 01/04/2014  . Paresthesia 01/04/2014  . Generalized abdominal pain 01/01/2014  . History of diverticulitis 01/01/2014  . Diaphoresis 12/29/2012  . Sleep disturbance 10/03/2012  . Depression 07/12/2012  . OA (osteoarthritis) of knee 06/27/2012  . History of PSVT 12/15/2011    Orientation RESPIRATION BLADDER Height & Weight     Self  Normal Incontinent Weight: 167 lb 5.3 oz (75.9 kg) Height:  5\' 3"  (160 cm)  BEHAVIORAL SYMPTOMS/MOOD NEUROLOGICAL BOWEL NUTRITION STATUS      Incontinent Diet(puree solids)  AMBULATORY STATUS COMMUNICATION OF NEEDS Skin   Extensive Assist Verbally  Normal                       Personal Care Assistance Level of Assistance  Bathing, Feeding, Dressing Bathing Assistance: Maximum assistance Feeding assistance: Limited assistance Dressing Assistance: Maximum assistance     Functional Limitations Info  Sight, Hearing, Speech Sight Info: Impaired(blind) Hearing Info: Adequate Speech Info: Adequate    SPECIAL CARE FACTORS FREQUENCY  PT (By licensed PT), OT (By licensed OT), Speech therapy     PT Frequency: 5x/wk OT Frequency: 5x/wk     Speech Therapy Frequency: 5x/wk      Contractures Contractures Info: Not present    Additional Factors Info  Code Status, Allergies Code Status Info: Full Allergies Info: Contrast Media Iodinated Diagnostic Agents, Fluorescein, Hydralazine Hcl, Sulfa Antibiotics, Ultram Tramadol, Yellow Dye, Buprenex Buprenorphine Hcl, Keflex Cephalexin, Lipitor Atorvastatin, Restasis Cyclosporine, Augmentin Amoxicillin-pot Clavulanate, Levofloxacin, Morphine And Related, Percocet Oxycodone-acetaminophen, Tape, Zetia Ezetimibe           Current Medications (09/28/2017):  This is the current hospital active medication list Current Facility-Administered Medications  Medication Dose Route Frequency Provider Last Rate Last Dose  . acetaminophen (TYLENOL) tablet 650 mg  650 mg Oral Q4H PRN Opyd, Ilene Qua, MD       Or  . acetaminophen (TYLENOL) solution 650 mg  650 mg Per Tube Q4H PRN Opyd, Ilene Qua, MD       Or  . acetaminophen (TYLENOL) suppository 650 mg  650 mg Rectal Q4H PRN Opyd, Ilene Qua, MD      . artificial  tears (LACRILUBE) ophthalmic ointment 1 application  1 application Both Eyes QHS Opyd, Ilene Qua, MD   1 application at 09/62/83 2105  . aspirin EC tablet 325 mg  325 mg Oral Daily Rosalin Hawking, MD   325 mg at 09/28/17 1023  . diltiazem (CARDIZEM) tablet 60 mg  60 mg Oral Q6H Thurnell Lose, MD   60 mg at 09/28/17 1023  . enoxaparin (LOVENOX) injection 40 mg  40 mg Subcutaneous Q24H Opyd,  Ilene Qua, MD   40 mg at 09/27/17 2105  . famotidine (PEPCID) tablet 20 mg  20 mg Oral QHS Thurnell Lose, MD   20 mg at 09/27/17 2104  . haloperidol lactate (HALDOL) injection 5 mg  5 mg Intravenous Q6H PRN Thurnell Lose, MD   5 mg at 09/27/17 2150  . lacosamide (VIMPAT) 100 mg in sodium chloride 0.9 % 25 mL IVPB  100 mg Intravenous Q12H Jonetta Osgood, MD   Stopped at 09/28/17 1137  . magnesium oxide (MAG-OX) tablet 200 mg  200 mg Oral QHS Opyd, Ilene Qua, MD   200 mg at 09/27/17 2103  . multivitamin (PROSIGHT) tablet 1 tablet  1 tablet Oral QPC breakfast Opyd, Ilene Qua, MD   1 tablet at 09/27/17 0915  . nebivolol (BYSTOLIC) tablet 2.5 mg  2.5 mg Oral BID Jonetta Osgood, MD   2.5 mg at 09/28/17 1022  . ondansetron (ZOFRAN) injection 4 mg  4 mg Intravenous Once Opyd, Ilene Qua, MD   Stopped at 09/21/17 1645  . ondansetron (ZOFRAN) injection 4 mg  4 mg Intravenous Q6H PRN Opyd, Ilene Qua, MD   4 mg at 09/21/17 2153  . polyvinyl alcohol (LIQUIFILM TEARS) 1.4 % ophthalmic solution 1 drop  1 drop Both Eyes BID PRN Opyd, Ilene Qua, MD      . pravastatin (PRAVACHOL) tablet 20 mg  20 mg Oral [id number] Rosalin Hawking, MD   20 mg at 09/27/17 2104  . prednisoLONE acetate (PRED FORTE) 1 % ophthalmic suspension 1 drop  1 drop Both Eyes QHS Opyd, Ilene Qua, MD   1 drop at 09/27/17 2105     Discharge Medications: Please see discharge summary for a list of discharge medications.  Relevant Imaging Results:  Relevant Lab Results:   Additional Information SS#: [id number]  Geralynn Ochs, LCSW

## 2017-09-29 ENCOUNTER — Inpatient Hospital Stay (HOSPITAL_COMMUNITY): Payer: PPO | Admitting: Occupational Therapy

## 2017-09-29 ENCOUNTER — Inpatient Hospital Stay (HOSPITAL_COMMUNITY): Payer: PPO | Admitting: Physical Therapy

## 2017-09-29 ENCOUNTER — Inpatient Hospital Stay (HOSPITAL_COMMUNITY): Payer: PPO | Admitting: Speech Pathology

## 2017-09-29 MED ORDER — NON FORMULARY: 3.0000 mg | Freq: Every day | Status: DC

## 2017-09-29 MED ORDER — LACOSAMIDE 50 MG PO TABS
100.0000 mg | ORAL_TABLET | Freq: Two times a day (BID) | ORAL | Status: DC
  Administered 2017-09-29 – 2017-09-30 (×2): 100 mg via ORAL
  Filled 2017-09-29 (×2): qty 2

## 2017-09-29 MED ORDER — APIXABAN 5 MG PO TABS
5.0000 mg | ORAL_TABLET | Freq: Two times a day (BID) | ORAL | Status: DC
Start: 2017-09-29 — End: 2017-09-30
  Administered 2017-09-29 – 2017-09-30 (×3): 5 mg via ORAL
  Filled 2017-09-29 (×3): qty 1

## 2017-09-29 MED ORDER — MELATONIN 3 MG PO TABS
3.0000 mg | ORAL_TABLET | Freq: Every day | ORAL | Status: DC
  Administered 2017-09-29: 3 mg via ORAL
  Filled 2017-09-29: qty 1

## 2017-09-29 NOTE — Consult Note (Addendum)
   [facility] CM Inpatient Consult   09/29/2017  [name] [name] September 09, 1924 [id number]    Patient screened for potential [facility] Care Management program. [name] [name] was engaged by [facility] in the past.  Martin Majestic to bedside to speak with patient and daughter. [name] [name] was resting soundly. Spoke with daughter [name] [name] about [place] [facility] re-engagement and [facility] follow up while at [facility].   [name] [name] pleasantly declines. States she still has [facility]'s number to contact if needed in the future.   Will make inpatient RNCM aware [facility] services were declined.   Marthenia Rolling, MSN-Ed, RN,BSN [facility] Liaison [phone]

## 2017-09-29 NOTE — Progress Notes (Addendum)
Occupational Therapy Treatment Patient Details Name: [name] [name] MRN: [id number] DOB: October 07, 1924 Today's Date: 09/29/2017    History of present illness 84 y.o. female with a PMH of CAD s/p NSTEMI 2015 (medical management), HTN, SVT, CVA with subsequent left eye blindness, who presented to the ED 09/21/17 with confusion, aphasia, and vision loss, and was found to have an acute embolic stroke   OT comments  Pt requiring increased assist with mobility this session, suspect due to increased lethargy from medications. Pt completing room level functional mobility initially with modA+2 (HHA), though increasing to maxA+2 and requiring max multimodal cues for LE placement as pt approaching toilet for transfer. Pt requiring max-totalA for UB/LB ADLs at this time. Noted CIR denial per chart review, d/c recommendations have been updated to reflect. Recommend SNF level therapy services at time of discharge to maximize her safety and independence with ADLs and mobility prior to return home. Will continue to follow acutely to progress pt towards established OT goals.   Follow Up Recommendations  SNF;Supervision/Assistance - 24 hour    Equipment Recommendations  3 in 1 bedside commode          Precautions / Restrictions Precautions Precautions: Fall Precaution Comments: bil visual deficits Restrictions Weight Bearing Restrictions: No       Mobility Bed Mobility Overal bed mobility: Needs Assistance Bed Mobility: Rolling;Sidelying to Sit Rolling: +2 for physical assistance;+2 for safety/equipment;Max assist Sidelying to sit: Max assist;+2 for physical assistance;+2 for safety/equipment       General bed mobility comments: cues to initiate moving LEs towards EOB; assist for LEs over EOB and to elevate trunk into sitting   Transfers Overall transfer level: Needs assistance Equipment used: 2 person hand held assist   Sit to Stand: Mod assist;+2 physical assistance;+2 safety/equipment          General transfer comment: assist to power up and steady with +2 HHA, R knee block initially; multimodal cues for hand placement on therapists arms due to low vision; stood from EOB and toilet     Balance Overall balance assessment: Needs assistance Sitting-balance support: Bilateral upper extremity supported;Feet supported Sitting balance-Leahy Scale: Fair Sitting balance - Comments: min guard assist to sit EOB   Standing balance support: During functional activity;Bilateral upper extremity supported Standing balance-Leahy Scale: Poor Standing balance comment: reliant on external support from therapist                           ADL either performed or assessed with clinical judgement   ADL Overall ADL's : Needs assistance/impaired                 Upper Body Dressing : Bed level;Maximal assistance Upper Body Dressing Details (indicate cue type and reason): doffing/donning new gown      Toilet Transfer: Moderate assistance;+2 for physical assistance;+2 for safety/equipment;Ambulation;Regular Toilet;Maximal assistance Toilet Transfer Details (indicate cue type and reason): increased assist for mobility when approaching toilet this session Toileting- Clothing Manipulation and Hygiene: +2 for physical assistance;+2 for safety/equipment;Sit to/from stand;Maximal assistance       Functional mobility during ADLs: Moderate assistance;+2 for physical assistance;+2 for safety/equipment;Maximal assistance General ADL Comments: pt with decreased arousal this session; able to ambulate to bathroom with mod-maxA+2 though requiring increased cues for LE placement when approaching toilet, pt tending to leave LE lifted (standing on one foot) requiring cues to place foot appropriately; brought recliner to doorway of bathroom for transfer to chair due to increased assist  with mobility      Vision   Additional Comments: pt tending to maintain R eye open this session, does not track  or make eye contact               Cognition Arousal/Alertness: Awake/alert Behavior During Therapy: Flat affect Overall Cognitive Status: Impaired/Different from baseline Area of Impairment: Following commands;Safety/judgement;Awareness;Problem solving                       Following Commands: Follows one step commands inconsistently;Follows one step commands with increased time   Awareness: Intellectual Problem Solving: Slow processing;Decreased initiation;Difficulty sequencing;Requires verbal cues;Requires tactile cues General Comments: increased delay in processing this session; requires max multimodal cues                           Pertinent Vitals/ Pain       Pain Assessment: Faces Faces Pain Scale: No hurt Pain Intervention(s): Monitored during session                                                          Frequency  Min 3X/week        Progress Toward Goals  OT Goals(current goals can now be found in the care plan section)  Progress towards OT goals: Progressing toward goals  Acute Rehab OT Goals Patient Stated Goal: none stated OT Goal Formulation: Patient unable to participate in goal setting Time For Goal Achievement: 10/07/17 Potential to Achieve Goals: Good  Plan Discharge plan needs to be updated    Co-evaluation    PT/OT/SLP Co-Evaluation/Treatment: Yes Reason for Co-Treatment: For patient/therapist safety;To address functional/ADL transfers;Complexity of the patient's impairments (multi-system involvement)   OT goals addressed during session: ADL's and self-care      AM-PAC PT "6 Clicks" Daily Activity     Outcome Measure   Help from another person eating meals?: A Lot Help from another person taking care of personal grooming?: A Lot Help from another person toileting, which includes using toliet, bedpan, or urinal?: A Lot Help from another person bathing (including washing, rinsing, drying)?: A  Lot Help from another person to put on and taking off regular upper body clothing?: A Lot Help from another person to put on and taking off regular lower body clothing?: A Lot 6 Click Score: 12    End of Session Equipment Utilized During Treatment: Gait belt  OT Visit Diagnosis: Other abnormalities of gait and mobility (R26.89);Unsteadiness on feet (R26.81);Other symptoms and signs involving cognitive function;Low vision, both eyes (H54.2);Cognitive communication deficit (R41.841) Symptoms and signs involving cognitive functions: Cerebral infarction   Activity Tolerance Patient tolerated treatment well;Patient limited by fatigue   Patient Left in chair;with call bell/phone within reach;with chair alarm set;with family/visitor present   Nurse Communication Mobility status;Other (comment)(IV leaking )        Time: 9326-7124 OT Time Calculation (min): 28 min  Charges: OT General Charges $OT Visit: 1 Visit OT Treatments $Self Care/Home Management : 8-22 mins  Lou Cal, OT Pager [phone] 09/29/2017    Raymondo Band 09/29/2017, 9:32 AM

## 2017-09-29 NOTE — Progress Notes (Signed)
[facility] PROGRESS NOTE  [name] [name] FTD:[id number] DOB: [birth year] DOA: 09/21/2017  PCP: Darcus Austin, MD  Brief History/Interval Summary: Patient is a 84 y.o. female with history of left eye blindness (corneal scarring), prior history of CVA on Plavix (loop recorder implanted-SVT detected as outpatient), presented with confusion, visual disturbances, sensory aphasia.  CT angiogram brain and CT perfusion study confirmed a left M3 occlusion with acute infarct involving the temporal cortical area.  She was admitted to the hospitalist service, but then developed generalized tonic-clonic seizure and A. fib with RVR.  She was started on IV Keppra, she spontaneously converted back to sinus rhythm.  Hospital course was complicated by delirium-and excessive sedation after using benzodiazepines (had seizure) and narcotics.  See below for further details  Reason for Visit: Acute stroke  Consultants: Neurology  Procedures:   Transthoracic echocardiogram Study Conclusions  - Left ventricle: The cavity size was normal. Wall thickness was   normal. Systolic function was normal. The estimated ejection   fraction was in the range of 60% to 65%. Wall motion was normal;   there were no regional wall motion abnormalities. Features are   consistent with a pseudonormal left ventricular filling pattern,   with concomitant abnormal relaxation and increased filling   pressure (grade 2 diastolic dysfunction). - Aortic valve: Mildly calcified annulus. Trileaflet; mildly   calcified leaflets. There was mild regurgitation. - Mitral valve: There was trivial regurgitation. - Left atrium: The atrium was at the upper limits of normal in   size. - Right atrium: Central venous pressure (est): 3 mm Hg. - Atrial septum: No defect or patent foramen ovale was identified. - Tricuspid valve: There was mild regurgitation. - Pulmonary arteries: PA peak pressure: 30 mm Hg (S). - Pericardium, extracardiac: There  was no pericardial effusion.   Antibiotics: None  Subjective/Interval History: Noted to be lethargic this morning.  Daughter is at the bedside.  Patient also very hard of hearing.  Not very communicative.  Daughter mentioned that patient was given Haldol around 3:00 this morning.  ROS: Unable to do  Objective:  Vital Signs  Vitals:   09/29/17 0016 09/29/17 0400 09/29/17 0750 09/29/17 1156  BP: (!) 168/79 (!) 148/72  (!) 144/75  Pulse: 69 68 72 73  Resp: 18   18  Temp: 97.7 F (36.5 C) 98 F (36.7 C) 98.2 F (36.8 C) 97.7 F (36.5 C)  TempSrc: Axillary Oral Oral Oral  SpO2: 97% 98% 98% 98%  Weight:      Height:       No intake or output data in the 24 hours ending 09/29/17 1406 Filed Weights   09/21/17 2055  Weight: 75.9 kg (167 lb 5.3 oz)    General appearance: alert, appears stated age, distracted and no distress Head: Normocephalic, without obvious abnormality, atraumatic Resp: clear to auscultation bilaterally Cardio: regular rate and rhythm, S1, S2 normal, no murmur, click, rub or gallop GI: soft, non-tender; bowel sounds normal; no masses,  no organomegaly Extremities: extremities normal, atraumatic, no cyanosis or edema Poorly responsive.  Reevaluated later in the day and was noted to be much more awake alert.  Lab Results:  Data Reviewed: I have personally reviewed following labs and imaging studies  CBC: Recent Labs  Lab 09/28/17 0400  WBC 5.0  HGB 14.9  HCT 47.0*  MCV 95.5  PLT 062    Basic Metabolic Panel: Recent Labs  Lab 09/23/17 0721 09/25/17 0449 09/25/17 0843 09/26/17 0737 09/28/17 0400  NA 138 139  --  142 141  K 3.3* 2.8*  --  3.8 3.2*  CL 103 103  --  109 102  CO2 25 24  --  27 29  GLUCOSE 89 175*  --  112* 106*  BUN 13 15  --  9 10  CREATININE 0.72 0.66  --  0.62 0.56  CALCIUM 8.4* 8.6*  --  8.8* 9.0  MG  --   --  1.9 2.0  --     GFR: Estimated Creatinine Clearance: 45.6 mL/min (by C-G formula based on SCr of 0.56  mg/dL).    Radiology Studies: No results found.   Medications:  Scheduled: . apixaban  5 mg Oral BID  . artificial tears  1 application Both Eyes QHS  . diltiazem  60 mg Oral Q6H  . famotidine  20 mg Oral QHS  . magnesium oxide  200 mg Oral QHS  . multivitamin  1 tablet Oral QPC breakfast  . nebivolol  2.5 mg Oral BID  . ondansetron (ZOFRAN) IV  4 mg Intravenous Once  . pravastatin  20 mg Oral q1800  . prednisoLONE acetate  1 drop Both Eyes QHS   Continuous: . lacosamide (VIMPAT) IV 100 mg (09/29/17 0929)   ALP:[id number] **OR** acetaminophen (TYLENOL) oral liquid 160 mg/5 mL **OR** acetaminophen, ondansetron (ZOFRAN) IV, polyvinyl alcohol  Assessment/Plan:   Lethargy/somnolence Most likely medication induced.  Patient was given 5 mg of Haldol around 3:00 this morning for agitation.  Patient reevaluated later in the day.  Much more awake alert compared to earlier today.  Will watch her for another night.  Acute CVA Thought to be embolic-atrial fibrillation confirmed on EKG and telemetry monitoring this admission. CT angiogram of the neck did not show any stenosis, 2D echocardiogram with preserved EF and without any embolic source. LDL 95, A1c 5.6. She unfortunately she was already blind in her left eye from corneal scarring, she now appears to have significant amount of right eye cortical blindness and motor aphasia from acute CVA. She is able to move all 4 extremities. Currently on aspirin, and high intensity statin,as per neurology started Eliquis on 09/29/2017 and continue with high intensity statin.   Long-term prognosis does not look good due to advanced age, now almost totally blind as she had left eye blindness from before acute vision loss in the right eye, deconditioning and other comorbidities. Explained this to the son & daughter bedside in detail on 09/26/2017. For now full code.  Acute metabolic encephalopathy Initially felt to be from postictal state and  Ativan that she received yesterday morning. She became very delirious on 7/4 night and received fentanyl. Again noted to be lethargic this morning.  She was given Haldol 5 mg last night.  We will stop this medication.  Avoid all sedative agents.  EEG on 7/3 negative for seizures, ABG without hypercarbia. Minimize benzodiazepines and Ativan.   PAF with [place] vas 2 score of 7 Maintaining sinus rhythm. Started on low-dose oral Bystolic, started Eliquis on 09/29/2017.  History of paroxysmal SVT Documented on loop recorder data (loop recorder implanted after CVA)-continue telemetry monitoring. Remains on beta-blockers  Dysphasia due to combination of CVA, blindness and metabolic encephalopathy Speech following. Currently ondysphagia 1 diet, remains high risk for aspiration. We will continue to monitor.   Generalized tonic-clonic seizure: Occurred on 7/3-after she was admitted-usually started on Keppra-she was somewhat sedated and so was switched over to Vimpat. EEG on 7/3 did not show seizure-like activity. Neurology following.  Clinically stable continue Vimpat upon  discharge.  Will change to oral.  Essential hypertension Was allowed some amount of permissive hypertension. Low-dose oral beta-blocker continued.    CAD Currently without any anginal symptoms.  Left eye blindness She has corneal scarring-unfortunately she may have developed significant visual deficits in her right side from this acute CVA.  Hypokalemia This was repleted  DVT Prophylaxis: Eliquis    Code Status: Full Code  Family Communication: Discussed with patient's daughter Disposition Plan: Plan for discharge to skilled nursing facility for short-term rehab on 7/11    LOS: 8 days   [facility] Pager [phone] 09/29/2017, 2:06 PM  If 7PM-7AM, please contact night-coverage at [email or web address], password [facility]

## 2017-09-29 NOTE — Progress Notes (Addendum)
UPDATE 12:10 PM:  CSW met with patient's daughter, [name], and they have chosen [facility]. CSW to continue to follow.  Laveda Abbe, LCSW Clinical Social Worker [phone]     CSW met with patient's daughter, [name], at bedside to discuss facility options. Patient's family are still unsure about where to send the patient. Patient's daughter kept asking CSW to make a recommendation and tell her what to do.  CSW to touch base with family again in a little while to discuss options.  Laveda Abbe,  Clinical Social Worker [phone]

## 2017-09-29 NOTE — Progress Notes (Signed)
[place] for apixaban Indication: atrial fibrillation  Allergies  Allergen Reactions  . Contrast Media [Iodinated Diagnostic Agents] Anaphylaxis  . Fluorescein Anaphylaxis, Shortness Of Breath, Itching and Swelling  . Hydralazine Hcl Anaphylaxis, Swelling, Palpitations and Rash  . Sulfa Antibiotics Rash and Other (See Comments)    Other Reaction: red bumps  . Ultram [Tramadol] Hives  . Yellow Dye Anaphylaxis, Itching, Swelling and Other (See Comments)    Fluorescein (in eye drops)  . Buprenex [Buprenorphine Hcl] Palpitations and Other (See Comments)  . Keflex [Cephalexin] Diarrhea  . Lipitor [Atorvastatin] Other (See Comments)    Muscle weakness  . Restasis [Cyclosporine] Swelling    redness of face with slight swelling   . Augmentin [Amoxicillin-Pot Clavulanate] Nausea And Vomiting  . Levofloxacin Other (See Comments)    Reaction not recalled  . Morphine And Related Other (See Comments)    agitation   . Percocet [Oxycodone-Acetaminophen] Nausea And Vomiting  . Tape Other (See Comments)    Adhesive tape pulls off the skin Able to tolerate paper tape  . Zetia [Ezetimibe] Other (See Comments)    Myalgias    Patient Measurements: Height: 5\' 3"  (160 cm) Weight: 167 lb 5.3 oz (75.9 kg) IBW/kg (Calculated) : 52.4  Labs: Recent Labs    09/28/17 0400  HGB 14.9  HCT 47.0*  PLT 176  CREATININE 0.56    Estimated Creatinine Clearance: 45.6 mL/min (by C-G formula based on SCr of 0.56 mg/dL).  Assessment: 84 yo female to begin apixaban for Afib/secondary stroke prevention. She is >84 y/o, >65 kg, and SCr <1.5 - she qualifies for normal dosing. No bleeding noted, CBC is normal.  Plan:  Apixaban 5 mg PO bid Stop aspirin per MD consult Stop Lovenox Education complete Pharmacy signing off but will continue to follow peripherally   Renold Genta, PharmD, BCPS Clinical Pharmacist Clinical phone for 09/29/2017 until 3p is [phone] Please  check AMION for all Pharmacist numbers by unit 09/29/2017 1:22 PM

## 2017-09-29 NOTE — Care Management Important Message (Signed)
Important Message  Patient Details  Name: [name] [name] MRN: [id number] Date of Birth: 1925/03/02   Medicare Important Message Given:  Yes    Keiji Melland Montine Circle 09/29/2017, 9:55 AM

## 2017-09-29 NOTE — Progress Notes (Signed)
Physical Therapy Treatment Patient Details Name: [name] [name] MRN: [id number] DOB: 09/26/1924 Today's Date: 09/29/2017    History of Present Illness Pt is a 84 y.o. female with a PMH of CAD s/p NSTEMI 2015 (medical management), HTN, SVT, CVA with subsequent left eye blindness, who presented to the ED 09/21/17 with confusion, aphasia, and vision loss, and was found to have an acute embolic stroke    PT Comments    Pt making slow progress with functional mobility and continues to require two person physical assistance for bed mobility, transfers and short distance ambulation. Pt also requiring multimodal cueing for sequencing and initiation of task. D/C recommendations updated as pt was denied for CIR per chart review. PT will continue to follow acutely to progress mobility as tolerated. Pt would continue to benefit from skilled physical therapy services at this time while admitted and after d/c to address the below listed limitations in order to improve overall safety and independence with functional mobility.   Follow Up Recommendations  SNF     Equipment Recommendations  None recommended by PT    Recommendations for Other Services       Precautions / Restrictions Precautions Precautions: Fall Precaution Comments: bil visual deficits Restrictions Weight Bearing Restrictions: No    Mobility  Bed Mobility Overal bed mobility: Needs Assistance Bed Mobility: Rolling;Sidelying to Sit Rolling: +2 for physical assistance;+2 for safety/equipment;Max assist Sidelying to sit: Max assist;+2 for physical assistance;+2 for safety/equipment       General bed mobility comments: cues to initiate moving LEs towards EOB; assist for LEs over EOB and to elevate trunk into sitting   Transfers Overall transfer level: Needs assistance Equipment used: 2 person hand held assist Transfers: Sit to/from Stand Sit to Stand: Mod assist;+2 physical assistance;+2 safety/equipment         General  transfer comment: increased time, pt able to initiate anterior weight shift, mod A x2 to power into standing x1 from EOB and x1 from toilet  Ambulation/Gait Ambulation/Gait assistance: Mod assist;+2 physical assistance;+2 safety/equipment Gait Distance (Feet): 30 Feet Assistive device: 2 person hand held assist Gait Pattern/deviations: Step-through pattern;Decreased step length - right;Decreased step length - left;Decreased stride length;Shuffle;Scissoring;Narrow base of support Gait velocity: decreased Gait velocity interpretation: <1.31 ft/sec, indicative of household ambulator General Gait Details: pt required multimodal cueing for initiation and continuity of ambulation within her room; [facility] with mod A and tactile cueing for sequencing with LE movement (especially with side stepping towards toilet).    Stairs             Wheelchair Mobility    Modified Rankin (Stroke Patients Only)       Balance Overall balance assessment: Needs assistance Sitting-balance support: Feet supported Sitting balance-Leahy Scale: Fair Sitting balance - Comments: min guard for safety at EOB   Standing balance support: During functional activity;Bilateral upper extremity supported Standing balance-Leahy Scale: Poor Standing balance comment: reliant on external support from therapist                            Cognition Arousal/Alertness: Lethargic Behavior During Therapy: Flat affect Overall Cognitive Status: Impaired/Different from baseline Area of Impairment: Following commands;Safety/judgement;Awareness;Problem solving                       Following Commands: Follows one step commands inconsistently;Follows one step commands with increased time Safety/Judgement: Decreased awareness of safety;Decreased awareness of deficits Awareness: Intellectual Problem Solving: Slow processing;Decreased initiation;Difficulty  sequencing;Requires verbal cues;Requires tactile  cues General Comments: increased delay in processing this session; requires max multimodal cues       Exercises      General Comments        Pertinent Vitals/Pain Pain Assessment: Faces Faces Pain Scale: No hurt Pain Intervention(s): Monitored during session    Home Living                      Prior Function            PT Goals (current goals can now be found in the care plan section) Acute Rehab PT Goals Patient Stated Goal: none stated PT Goal Formulation: With patient/family Time For Goal Achievement: 10/07/17 Potential to Achieve Goals: Good Progress towards PT goals: Progressing toward goals    Frequency    Min 4X/week      PT Plan Current plan remains appropriate;Discharge plan needs to be updated    Co-evaluation PT/OT/SLP Co-Evaluation/Treatment: Yes Reason for Co-Treatment: Complexity of the patient's impairments (multi-system involvement);For patient/therapist safety;To address functional/ADL transfers PT goals addressed during session: Mobility/safety with mobility;Balance;Proper use of DME;Strengthening/ROM OT goals addressed during session: ADL's and self-care      AM-PAC PT "6 Clicks" Daily Activity  Outcome Measure  Difficulty turning over in bed (including adjusting bedclothes, sheets and blankets)?: Unable Difficulty moving from lying on back to sitting on the side of the bed? : Unable Difficulty sitting down on and standing up from a chair with arms (e.g., wheelchair, bedside commode, etc,.)?: Unable Help needed moving to and from a bed to chair (including a wheelchair)?: A Little Help needed walking in hospital room?: A Lot Help needed climbing 3-5 steps with a railing? : A Lot 6 Click Score: 10    End of Session Equipment Utilized During Treatment: Gait belt Activity Tolerance: Patient tolerated treatment well Patient left: in chair;with call bell/phone within reach;with chair alarm set;with family/visitor present Nurse  Communication: Mobility status PT Visit Diagnosis: Difficulty in walking, not elsewhere classified (R26.2);Other symptoms and signs involving the nervous system (Z76.734)     Time: 1937-9024 PT Time Calculation (min) (ACUTE ONLY): 28 min  Charges:  $Gait Training: 8-22 mins                    G Codes:       [place], [place], [place] 09/29/2017, 9:58 AM

## 2017-09-29 NOTE — Progress Notes (Signed)
CSW met with patient's daughter, Sheila, and spoke with patient's other daughter, [name], over phone to discuss plans now that insurance has denied placement. Patient's daughters are looking at [facility] and [facility], but would like the night to review and do more research.   CSW contacted on-call nurse with [facility] to initiate insurance authorization request for SNF placement.   CSW to continue to follow.   , LCSW Clinical Social Worker [phone]  

## 2017-09-29 NOTE — Clinical Social Work Note (Signed)
Clinical Social Work Assessment  Patient Details  Name: [name] [name] MRN: [id number] Date of Birth: 24-Nov-1924  Date of referral:  09/28/17               Reason for consult:  Facility Placement                Permission sought to share information with:  Facility Sport and exercise psychologist, Family Supports Permission granted to share information::  Yes, Verbal Permission Granted  Name::     [name] [name]  Agency::  SNF  Relationship::  Daughters  Contact Information:     Housing/Transportation Living arrangements for the past 2 months:  Single Family Home Source of Information:  Adult Children Patient Interpreter Needed:  None Criminal Activity/Legal Involvement Pertinent to Current Situation/Hospitalization:  No - Comment as needed Significant Relationships:  Adult Children, Other Family Members Lives with:  Self Do you feel safe going back to the place where you live?  Yes Need for family participation in patient care:  Yes (Comment)  Care giving concerns:  Patient from home alone but will need short term SNF at discharge.    Social Worker assessment / plan:  CSW met with patient's daughter, [name] [name], at bedside to discuss plans in case [facility] denies CIR admission. CSW provided information on options available, SNF placement vs home with home health. CSW provided facility list for review. CSW to fax out referral and follow up.  Employment status:  Retired Nurse, adult PT Recommendations:  Inpatient [place] / Referral to community resources:  [place]  Patient/Family's Response to care:  Patient's family is agreeable to SNF placement.  Patient/Family's Understanding of and Emotional Response to Diagnosis, Current Treatment, and Prognosis:  Patient's family is overwhelmed with this whole situation and upset at the state that their mother is in. Patient's family wants her to get as much therapy as possible to gain back  as many skills as she can. Patient's family will review facilities and update CSW with choices.  Emotional Assessment Appearance:  Appears stated age Attitude/Demeanor/Rapport:  Unable to Assess Affect (typically observed):  Unable to Assess Orientation:  Oriented to Self Alcohol / Substance use:  Not Applicable Psych involvement (Current and /or in the community):  No (Comment)  Discharge Needs  Concerns to be addressed:  Care Coordination Readmission within the last 30 days:  No Current discharge risk:  Dependent with Mobility, Lives alone, Physical Impairment, Cognitively Impaired Barriers to Discharge:  Continued Medical Work up, [place], [place] 09/29/2017, 10:49 AM

## 2017-09-30 DIAGNOSIS — H5450 Low vision, one eye, unspecified eye: Secondary | ICD-10-CM | POA: Diagnosis not present

## 2017-09-30 DIAGNOSIS — M6281 Muscle weakness (generalized): Secondary | ICD-10-CM | POA: Diagnosis not present

## 2017-09-30 DIAGNOSIS — Z111 Encounter for screening for respiratory tuberculosis: Secondary | ICD-10-CM | POA: Diagnosis not present

## 2017-09-30 DIAGNOSIS — Z743 Need for continuous supervision: Secondary | ICD-10-CM | POA: Diagnosis not present

## 2017-09-30 DIAGNOSIS — R52 Pain, unspecified: Secondary | ICD-10-CM | POA: Diagnosis not present

## 2017-09-30 DIAGNOSIS — G9341 Metabolic encephalopathy: Secondary | ICD-10-CM | POA: Diagnosis not present

## 2017-09-30 DIAGNOSIS — I10 Essential (primary) hypertension: Secondary | ICD-10-CM | POA: Diagnosis not present

## 2017-09-30 DIAGNOSIS — I639 Cerebral infarction, unspecified: Secondary | ICD-10-CM | POA: Diagnosis not present

## 2017-09-30 DIAGNOSIS — I471 Supraventricular tachycardia: Secondary | ICD-10-CM | POA: Diagnosis not present

## 2017-09-30 DIAGNOSIS — K5900 Constipation, unspecified: Secondary | ICD-10-CM | POA: Diagnosis not present

## 2017-09-30 DIAGNOSIS — I63412 Cerebral infarction due to embolism of left middle cerebral artery: Secondary | ICD-10-CM | POA: Diagnosis not present

## 2017-09-30 DIAGNOSIS — I2510 Atherosclerotic heart disease of native coronary artery without angina pectoris: Secondary | ICD-10-CM | POA: Diagnosis not present

## 2017-09-30 DIAGNOSIS — G40309 Generalized idiopathic epilepsy and epileptic syndromes, not intractable, without status epilepticus: Secondary | ICD-10-CM | POA: Diagnosis not present

## 2017-09-30 DIAGNOSIS — R41841 Cognitive communication deficit: Secondary | ICD-10-CM | POA: Diagnosis not present

## 2017-09-30 DIAGNOSIS — R112 Nausea with vomiting, unspecified: Secondary | ICD-10-CM | POA: Diagnosis not present

## 2017-09-30 DIAGNOSIS — I6389 Other cerebral infarction: Secondary | ICD-10-CM | POA: Diagnosis not present

## 2017-09-30 DIAGNOSIS — R1312 Dysphagia, oropharyngeal phase: Secondary | ICD-10-CM | POA: Diagnosis not present

## 2017-09-30 DIAGNOSIS — R404 Transient alteration of awareness: Secondary | ICD-10-CM | POA: Diagnosis not present

## 2017-09-30 DIAGNOSIS — G4089 Other seizures: Secondary | ICD-10-CM | POA: Diagnosis not present

## 2017-09-30 DIAGNOSIS — E785 Hyperlipidemia, unspecified: Secondary | ICD-10-CM | POA: Diagnosis not present

## 2017-09-30 DIAGNOSIS — K219 Gastro-esophageal reflux disease without esophagitis: Secondary | ICD-10-CM | POA: Diagnosis not present

## 2017-09-30 DIAGNOSIS — I69320 Aphasia following cerebral infarction: Secondary | ICD-10-CM | POA: Diagnosis not present

## 2017-09-30 DIAGNOSIS — R109 Unspecified abdominal pain: Secondary | ICD-10-CM | POA: Diagnosis not present

## 2017-09-30 DIAGNOSIS — R279 Unspecified lack of coordination: Secondary | ICD-10-CM | POA: Diagnosis not present

## 2017-09-30 DIAGNOSIS — R531 Weakness: Secondary | ICD-10-CM | POA: Diagnosis not present

## 2017-09-30 DIAGNOSIS — R262 Difficulty in walking, not elsewhere classified: Secondary | ICD-10-CM | POA: Diagnosis not present

## 2017-09-30 DIAGNOSIS — H5442A3 Blindness left eye category 3, normal vision right eye: Secondary | ICD-10-CM | POA: Diagnosis not present

## 2017-09-30 DIAGNOSIS — E876 Hypokalemia: Secondary | ICD-10-CM | POA: Diagnosis not present

## 2017-09-30 DIAGNOSIS — I480 Paroxysmal atrial fibrillation: Secondary | ICD-10-CM | POA: Diagnosis not present

## 2017-09-30 DIAGNOSIS — R1310 Dysphagia, unspecified: Secondary | ICD-10-CM | POA: Diagnosis not present

## 2017-09-30 DIAGNOSIS — G4700 Insomnia, unspecified: Secondary | ICD-10-CM | POA: Diagnosis not present

## 2017-09-30 DIAGNOSIS — I69391 Dysphagia following cerebral infarction: Secondary | ICD-10-CM | POA: Diagnosis not present

## 2017-09-30 LAB — BASIC METABOLIC PANEL
ANION GAP: 8 (ref 5–15)
BUN: 11 mg/dL (ref 8–23)
CO2: 31 mmol/L (ref 22–32)
Calcium: 9.5 mg/dL (ref 8.9–10.3)
Chloride: 101 mmol/L (ref 98–111)
Creatinine, Ser: 0.73 mg/dL (ref 0.44–1.00)
GFR calc Af Amer: >60 mL/min (ref >60)
GFR calc non Af Amer: >60 mL/min (ref >60)
Glucose, Bld: 110 mg/dL — ABNORMAL HIGH (ref 70–99)
POTASSIUM: 3.8 mmol/L (ref 3.5–5.1)
Sodium: 140 mmol/L (ref 135–145)

## 2017-09-30 NOTE — Progress Notes (Signed)
  Speech Language Pathology Treatment: Dysphagia;Cognitive-Linquistic  Patient Details Name: [name] [name] MRN: [id number] DOB: 01/24/1925 Today's Date: 09/30/2017 Time: 6759-1638 SLP Time Calculation (min) (ACUTE ONLY): 30 min  Assessment / Plan / Recommendation Clinical Impression  Pt responsive primarily to tactile cues. SLP able to facilitate PO intake by given pt a cup to hold while total assist feeding with a spoon, pressure needed to mandible to encourage opening of mouth. Pt also taking sips with a straw or cup with throat clearing and cough; gentle tactile cues to limit bolus size seemed to reduce airway invasion events. Suspect premature spillage. Continue current diet.  Pt is hard of hearing, aphasic and blind with cognitive impairment as well. Today she is calm but occasionally perseverative. Pt demonstrated automatic verbalizations with daughter when saying goodbye. Pt became much more verbal while holding a phone to her ear, but comprehension absent, no meaningful verbalizations, could not repeat. Pt responsive to prosody and intonation only. Counting with MIT yielded no response, pt hummed with singing but did not sing along. Discussed techniques with daughter and granddaughter.    HPI HPI: [name] [name] is a 84 y.o. female with medical history significant for coronary artery disease, hypertension, left eye blindness, and history of CVA with residual right-sided visual deficits. Presents with aphasia, confusion, and headache, CTA head and neck reveals acute M3 occlusion with associated infarct in posterior left temporal lobe with 1 mm penumbra. Pt with a witnessed seizure overnight.       SLP Plan  Continue with current plan of care       Recommendations  Diet recommendations: Dysphagia 1 (puree);Thin liquid Liquids provided via: Cup;Straw Medication Administration: Crushed with puree Supervision: Full supervision/cueing for compensatory strategies;Staff to assist with self  feeding Compensations: Minimize environmental distractions;Slow rate;Small sips/bites Postural Changes and/or Swallow Maneuvers: Seated upright 90 degrees                Oral Care Recommendations: Oral care BID Follow up Recommendations: Skilled Nursing facility SLP Visit Diagnosis: Dysphagia, unspecified (R13.10) Plan: Continue with current plan of care       GO                May [place]ment, Katherene Ponto 09/30/2017, 9:57 AM

## 2017-09-30 NOTE — Clinical Social Work Placement (Addendum)
Nurse to call report to [phone], Room 602B  Transport set for 2:00 PM     CLINICAL SOCIAL WORK PLACEMENT  NOTE  Date:  09/30/2017  Patient Details  Name: [name] [name] MRN: [id number] Date of Birth: 1924-09-14  Clinical Social Work is seeking post-discharge placement for this patient at the [place] level of care (*CSW will initial, date and re-position this form in  chart as items are completed):  Yes   Patient/family provided with [facility] Work Department's list of facilities offering this level of care within the geographic area requested by the patient (or if unable, by the patient's family).  Yes   Patient/family informed of their freedom to choose among providers that offer the needed level of care, that participate in Medicare, Medicaid or managed care program needed by the patient, have an available bed and are willing to accept the patient.  Yes   Patient/family informed of [facility]'s ownership interest in [facility] and [facility], as well as of the fact that they are under no obligation to receive care at these facilities.  PASRR submitted to EDS on 09/28/17     PASRR number received on 09/28/17     Existing PASRR number confirmed on       FL2 transmitted to all facilities in geographic area requested by pt/family on 09/28/17     FL2 transmitted to all facilities within larger geographic area on       Patient informed that his/her managed care company has contracts with or will negotiate with certain facilities, including the following:        Yes   Patient/family informed of bed offers received.  Patient chooses bed at [facility]     Physician recommends and patient chooses bed at      Patient to be transferred to [facility] on 09/30/17.  Patient to be transferred to facility by [facility]     Patient family notified on 09/30/17 of transfer.  Name of family member notified:  [name]     PHYSICIAN        Additional Comment:    _______________________________________________ Geralynn Ochs, LCSW 09/30/2017, 11:29 AM

## 2017-09-30 NOTE — Progress Notes (Signed)
Physical Therapy Treatment Patient Details Name: [name] [name] MRN: [id number] DOB: 1925/02/23 Today's Date: 09/30/2017    History of Present Illness Pt is a 84 y.o. female with a PMH of CAD s/p NSTEMI 2015 (medical management), HTN, SVT, CVA with subsequent left eye blindness, who presented to the ED 09/21/17 with confusion, aphasia, and vision loss, and was found to have an acute embolic stroke    PT Comments    Pt has been sitting in recliner all morning and is very tired on entry. Pt answers yes to all questions asked of her, and will not open eyes during session. Pt follows commands very inconsistently and is unable to participate in exercises seated in recliner. Pt is modAx2 for sit>stand and stand step transfer to bed and maxAx2 for laying back down in bed.  D/c planning remains appropriate.   Follow Up Recommendations  SNF     Equipment Recommendations  None recommended by PT    Recommendations for Other Services       Precautions / Restrictions Precautions Precautions: Fall Precaution Comments: bil visual deficits Restrictions Weight Bearing Restrictions: No    Mobility  Bed Mobility Overal bed mobility: Needs Assistance Bed Mobility: Rolling;Sidelying to Sit     Supine to sit: Max assist;+2 for physical assistance     General bed mobility comments: max Ax2  for trunk to bed and LE management into bed   Transfers Overall transfer level: Needs assistance Equipment used: 2 person hand held assist Transfers: Sit to/from Stand Sit to Stand: Mod assist;+2 physical assistance;+2 safety/equipment         General transfer comment: increased time, vc for scooting to edge of recliner, modAx2 for power up into standing   Ambulation/Gait Ambulation/Gait assistance: Mod assist;+2 physical assistance Gait Distance (Feet): 2 Feet Assistive device: 2 person hand held assist Gait Pattern/deviations: Decreased step length - right;Decreased step length - left;Shuffle;Narrow  base of support;Step-to pattern Gait velocity: decreased Gait velocity interpretation: <1.31 ft/sec, indicative of household ambulator General Gait Details: pt lethargic from sitting up in chair and only able to stand step transfer back to bed      Balance Overall balance assessment: Needs assistance Sitting-balance support: Feet supported Sitting balance-Leahy Scale: Fair Sitting balance - Comments: min guard for safety at edge of recliner    Standing balance support: During functional activity;Bilateral upper extremity supported Standing balance-Leahy Scale: Poor Standing balance comment: requires therapist assist to maintain balance, eyes closed througout                             Cognition Arousal/Alertness: Lethargic Behavior During Therapy: Flat affect Overall Cognitive Status: Impaired/Different from baseline Area of Impairment: Following commands;Safety/judgement;Awareness;Problem solving                       Following Commands: Follows one step commands inconsistently;Follows one step commands with increased time Safety/Judgement: Decreased awareness of safety;Decreased awareness of deficits Awareness: Intellectual Problem Solving: Slow processing;Decreased initiation;Difficulty sequencing;Requires verbal cues;Requires tactile cues           General Comments General comments (skin integrity, edema, etc.): grand daughter present during session, VSS      Pertinent Vitals/Pain Pain Assessment: Faces Faces Pain Scale: No hurt           PT Goals (current goals can now be found in the care plan section) Acute Rehab PT Goals PT Goal Formulation: With patient/family Time For Goal Achievement: 10/07/17  Potential to Achieve Goals: Good Progress towards PT goals: Progressing toward goals    Frequency    Min 4X/week      PT Plan Current plan remains appropriate;Discharge plan needs to be updated       AM-PAC PT "6 Clicks" Daily  Activity  Outcome Measure  Difficulty turning over in bed (including adjusting bedclothes, sheets and blankets)?: Unable Difficulty moving from lying on back to sitting on the side of the bed? : Unable Difficulty sitting down on and standing up from a chair with arms (e.g., wheelchair, bedside commode, etc,.)?: Unable Help needed moving to and from a bed to chair (including a wheelchair)?: A Little Help needed walking in hospital room?: A Lot Help needed climbing 3-5 steps with a railing? : A Lot 6 Click Score: 10    End of Session Equipment Utilized During Treatment: Gait belt Activity Tolerance: Patient tolerated treatment well Patient left: in chair;with call bell/phone within reach;with chair alarm set;with family/visitor present Nurse Communication: Mobility status PT Visit Diagnosis: Difficulty in walking, not elsewhere classified (R26.2);Other symptoms and signs involving the nervous system (F74.944)     Time: 9675-9163 PT Time Calculation (min) (ACUTE ONLY): 14 min  Charges:  $Therapeutic Activity: 8-22 mins                    G Codes:       Hannah Strader B. Migdalia Dk PT, DPT Acute Rehabilitation  [phone] Pager [phone]     [place] 09/30/2017, 10:38 AM

## 2017-09-30 NOTE — Care Management Note (Signed)
Case Management Note  Patient Details  Name: [name] [name] MRN: [id number] Date of Birth: Jul 22, 1924  Subjective/Objective:                    Action/Plan: Pt is discharging to [facility] today. CM signing off.   Expected Discharge Date:  09/30/17               Expected Discharge Plan:  [name]  In-House Referral:     Discharge planning Services  CM Consult  Post Acute Care Choice:    Choice offered to:     DME Arranged:    DME Agency:     HH Arranged:    HH Agency:     Status of Service:  Completed, signed off  If discussed at [employer], dates discussed:    Additional Comments:  Pollie Friar, RN 09/30/2017, 11:35 AM

## 2017-09-30 NOTE — Discharge Instructions (Signed)
Follow with Primary MD Darcus Austin, MD in 7 days   Get CBC, CMP,  checked  by Primary MD or SNF MD in 5-7 days   Activity: As tolerated with Full fall precautions use walker/cane & assistance as needed  Diet:   Dysphagia 1 diet with feeding assistance and aspiration precautions.  For Heart failure patients - Check your Weight same time everyday, if you gain over 2 pounds, or you develop in leg swelling, experience more shortness of breath or chest pain, call your Primary MD immediately. Follow Cardiac Low Salt Diet and 1.5 lit/day fluid restriction.   Information on my medicine - ELIQUIS (apixaban)  This medication education was reviewed with me or my healthcare representative as part of my discharge preparation.  The pharmacist that spoke with me during my hospital stay was:  ANNA K LOVE, [place]  Why was Eliquis prescribed for you? Eliquis was prescribed for you to reduce the risk of a blood clot forming that can cause a stroke if you have a medical condition called atrial fibrillation (a type of irregular heartbeat).  What do You need to know about Eliquis ? Take your Eliquis TWICE DAILY - one tablet in the morning and one tablet in the evening with or without food. If you have difficulty swallowing the tablet whole please discuss with your pharmacist how to take the medication safely.  Take Eliquis exactly as prescribed by your doctor and DO NOT stop taking Eliquis without talking to the doctor who prescribed the medication.  Stopping may increase your risk of developing a stroke.  Refill your prescription before you run out.  After discharge, you should have regular check-up appointments with your healthcare provider that is prescribing your Eliquis.  In the future your dose may need to be changed if your kidney function or weight changes by a significant amount or as you get older.  What do you do if you miss a dose? If you miss a dose, take it as soon as you remember on the same  day and resume taking twice daily.  Do not take more than one dose of ELIQUIS at the same time to make up a missed dose.  Important Safety Information A possible side effect of Eliquis is bleeding. You should call your healthcare provider right away if you experience any of the following: ? Bleeding from an injury or your nose that does not stop. ? Unusual colored urine (red or dark brown) or unusual colored stools (red or black). ? Unusual bruising for unknown reasons. ? A serious fall or if you hit your head (even if there is no bleeding).  Some medicines may interact with Eliquis and might increase your risk of bleeding or clotting while on Eliquis. To help avoid this, consult your healthcare provider or pharmacist prior to using any new prescription or non-prescription medications, including herbals, vitamins, non-steroidal anti-inflammatory drugs (NSAIDs) and supplements.  This website has more information on Eliquis (apixaban): http://www.eliquis.com/eliquis/home

## 2017-09-30 NOTE — Progress Notes (Signed)
Attempted to call [facility] to report but answering service verbalize that nursing staff is unavailable at this time. [facility]  Transport to disposition. Family aware. All belongings sent with family.   Ave Filter, RN

## 2017-09-30 NOTE — Discharge Summary (Signed)
[facility]  Physician Discharge Summary   Patient ID: Latausha Flamm MRN: [id number] DOB/AGE: 09-20-1924 84 y.o.  Admit date: 09/21/2017 Discharge date: 09/30/2017  PCP: Darcus Austin, MD  DISCHARGE DIAGNOSES:  Acute stroke  RECOMMENDATIONS FOR OUTPATIENT FOLLOW UP: 1. CBC and basic metabolic panel in 1 week 2. Use aspiration precautions. 3. Patient also needs to be followed by speech therapy for her dysphagia.   DISCHARGE CONDITION: fair  Diet recommendation: Dysphagia 1 diet with thin liquids  Filed Weights   09/21/17 2055  Weight: 75.9 kg (167 lb 5.3 oz)    INITIAL HISTORY: Patient is a84 y.o.female with history of left eye blindness (corneal scarring), prior history of CVA on Plavix (loop recorder implanted-SVT detected as outpatient), presented with confusion, visual disturbances, sensory aphasia. CT angiogram brain and CT perfusion study confirmed a left M3 occlusion with acute infarct involving the temporal cortical area. She was admitted to the hospitalist service, but then developed generalized tonic-clonic seizure and A. fib with RVR. She was started on IV Keppra, she spontaneously converted back to sinus rhythm. Hospital course was complicated by delirium-and excessive sedation after using benzodiazepines (had seizure) and narcotics. See below for further details  Consultations:  Neurology  Procedures:  Transthoracic echocardiogram Study Conclusions  - Left ventricle: The cavity size was normal. Wall thickness was normal. Systolic function was normal. The estimated ejection fraction was in the range of 60% to 65%. Wall motion was normal; there were no regional wall motion abnormalities. Features are consistent with a pseudonormal left ventricular filling pattern, with concomitant abnormal relaxation and increased filling pressure (grade 2 diastolic dysfunction). - Aortic valve: Mildly calcified annulus. Trileaflet;  mildly calcified leaflets. There was mild regurgitation. - Mitral valve: There was trivial regurgitation. - Left atrium: The atrium was at the upper limits of normal in size. - Right atrium: Central venous pressure (est): 3 mm Hg. - Atrial septum: No defect or patent foramen ovale was identified. - Tricuspid valve: There was mild regurgitation. - Pulmonary arteries: PA peak pressure: 30 mm Hg (S). - Pericardium, extracardiac: There was no pericardial effusion.     HOSPITAL COURSE:   Acute metabolic encephalopathy/Lethargy/somnolence Initially felt to be from postictal state and Ativan. She became very delirious on 7/4 night and received fentanyl. Again noted to be lethargic on the morning of 7/10 after she was given Haldol the previous night.  All sedative agents were discontinued. EEG on 7/3 negative for seizures, ABG without hypercarbia. She is doing much better this morning.  Daughter is at the bedside.  Patient is eating breakfast.  Avoid sedating agents going forward.  Use calming measures first if she gets agitated.  Acute CVA Thought to be embolic-atrial fibrillation confirmed on EKG and telemetry monitoring this admission. CT angiogram of the neck did not show any stenosis, 2D echocardiogram with preserved EF and without any embolic source. LDL 95, A1c 5.6. She unfortunately she was already blind in her left eye from corneal scarring, she now appears to have significant amount of right eye cortical blindness and motor aphasia from acute CVA. She is able to move all 4 extremities. She is seen by neurology.  Started on Eliquis and high intensity statin. Long-term prognosis does not look good due to advanced age, now almost totally blind as she had left eye blindness from before acute vision loss in the right eye, deconditioning and other comorbidities. Explained this to the son& daughterbedside in detail on 09/26/2017. For now full code.  Paroxysmal atrial fibrillation  with  Mali vas 2 score of 7 Maintaining sinus rhythm. Started on low-dose oral Bystolic, started Eliquis on 09/29/2017.  History of paroxysmal SVT Documented on loop recorder data (loop recorder implanted after CVA)-continue telemetry monitoring. Remains on beta-blockers.  Dysphasia due to combination of CVA, blindness and metabolic encephalopathy Speech following. Currently ondysphagia 1 diet, remains high risk for aspiration.   Generalized tonic-clonic seizure: Occurred on 7/3-after she was admitted-usually started on Keppra-she was somewhat sedated and so was switched over to Vimpat. EEG on 7/3 did not show seizure-like activity. Stable on Vimpat.  Changed to oral.  Continue.   Essential hypertension Was allowed some amount of permissive hypertension. Low-dose oral beta-blocker continued.  CAD Currently without any anginal symptoms.  Left eye blindness She has corneal scarring-unfortunately she may have developed significant visual deficits in her right side from this acute CVA.  Hypokalemia This was repleted.  Levels are normal this morning.  Overall stable.  Okay for discharge to skilled nursing facility for short-term rehab.     PERTINENT LABS:  The results of significant diagnostics from this hospitalization (including imaging, microbiology, ancillary and laboratory) are listed below for reference.      Labs: Basic Metabolic Panel: Recent Labs  Lab 09/25/17 0449 09/25/17 0843 09/26/17 0737 09/28/17 0400 09/30/17 0819  NA 139  --  142 141 140  K 2.8*  --  3.8 3.2* 3.8  CL 103  --  109 102 101  CO2 24  --  27 29 31   GLUCOSE 175*  --  112* 106* 110*  BUN 15  --  9 10 11   CREATININE 0.66  --  0.62 0.56 0.73  CALCIUM 8.6*  --  8.8* 9.0 9.5  MG  --  1.9 2.0  --   --    CBC: Recent Labs  Lab 09/28/17 0400  WBC 5.0  HGB 14.9  HCT 47.0*  MCV 95.5  PLT 176     IMAGING STUDIES Ct Angio Head W Or Wo Contrast  Result Date:  09/21/2017 CLINICAL DATA:  Initial evaluation for acute aphasia, stroke. EXAM: CT ANGIOGRAPHY HEAD AND NECK CT PERFUSION BRAIN TECHNIQUE: Multidetector CT imaging of the head and neck was performed using the standard protocol during bolus administration of intravenous contrast. Multiplanar CT image reconstructions and MIPs were obtained to evaluate the vascular anatomy. Carotid stenosis measurements (when applicable) are obtained utilizing NASCET criteria, using the distal internal carotid diameter as the denominator. Multiphase CT imaging of the brain was performed following IV bolus contrast injection. Subsequent parametric perfusion maps were calculated using RAPID software. CONTRAST:  31mL ISOVUE-370 IOPAMIDOL (ISOVUE-370) INJECTION 76% COMPARISON:  Prior CT from earlier the same day. FINDINGS: CTA NECK FINDINGS Aortic arch: Visualized aortic arch ectatic with normal 3 vessel morphology. Mild atheromatous plaque within the aortic arch. No hemodynamically significant stenosis about the origin of the great vessels. Visualized subclavian arteries tortuous proximally but widely patent without stenosis. Right carotid system: Right common carotid artery tortuous proximally but widely patent to the bifurcation. A centric plaque about the bifurcation without hemodynamically significant stenosis. Right ICA mildly tortuous but widely patent to the skull base without stenosis, dissection, or occlusion. Left carotid system: Left common carotid artery tortuous proximally but widely patent to the bifurcation without stenosis. No significant atheromatous narrowing about the left bifurcation. Left ICA tortuous and partially medialized into the retropharyngeal space. Left ICA patent to the skull base without stenosis, dissection, or occlusion. Vertebral arteries: Both of the vertebral arteries arise from the subclavian arteries.  Left vertebral artery dominant. Vertebral arteries tortuous proximally but widely patent to the skull  base without stenosis, dissection, or occlusion. Skeleton: No acute osseous abnormality. No worrisome lytic or blastic osseous lesions. Other neck: No acute soft tissue abnormality within the neck. Salivary glands normal. Thyroid normal. No adenopathy. Upper chest: Shotty subcentimeter lymph nodes noted within the visualized upper mediastinum. Scattered atelectatic changes present within the partially visualized lungs. Review of the MIP images confirms the above findings CTA HEAD FINDINGS Anterior circulation: Petrous segments widely patent bilaterally. Mild scattered atheromatous plaque within the cavernous/supraclinoid ICAs without stenosis. ICA termini widely patent. A1 segments patent bilaterally. Left A1 hypoplastic. Normal anterior communicating artery. Anterior cerebral arteries patent to their distal aspects without flow-limiting stenosis. Left M1 patent without stenosis. Normal left MCA bifurcation. There is a proximal left M3 occlusion rising off the inferior division (series 8, image 96). Adjacent inferior M3 branch remains patent. Superior left MCA division remains patent although distally appears attenuated in somewhat irregular. Right M1 mildly irregular and diffusely narrowed without high-grade or flow-limiting stenosis. Normal right MCA bifurcation. No proximal right M2 occlusion. Distal small vessel atheromatous irregularity present throughout the right MCA branches. Posterior circulation: V4 segments patent to the vertebrobasilar junction without flow-limiting stenosis. Left vertebral artery dominant. Posterior inferior cerebral arteries patent bilaterally. Basilar widely patent to its distal aspect without stenosis. Superior cerebral arteries patent bilaterally. Both of the posterior cerebral arteries supplied via the basilar. Scattered atheromatous irregularity throughout the mid and distal PCAs without high-grade correctable stenosis. Venous sinuses: Not well assessed due to arterial timing of  the contrast bolus. Anatomic variants: None significant.  No aneurysm. Delayed phase: Not performed. Review of the MIP images confirms the above findings CT Brain Perfusion Findings: CBF (<30%) Volume: 22mL Perfusion (Tmax>6.0s) volume: 5mL Mismatch Volume: 68mL Infarction Location:Ischemic core infarct involves the left temporoccipital region. Fairly small amount of surrounding ischemic penumbra. IMPRESSION: 1. Acute left M3 occlusion, likely embolic. Associated core infarct involving the left temporoccipital region with relatively small volume 11 mL surrounding ischemic penumbra. 2. Atheromatous change involving the intracranial circulation, most notable at the right M1 segment and bilateral PCAs. No hemodynamically significant or correctable stenosis identified. 3. Mild for age atherosclerotic change about the carotid bifurcations without high-grade stenosis. 4. Diffuse tortuosity of the major arterial vasculature of the neck, suggesting chronic underlying hypertension. Critical Value/emergent results were called by telephone at the time of interpretation on 09/21/2017 at 5:59 pm to Dr. Cheral Marker, who verbally acknowledged these results. Electronically Signed   By: Jeannine Boga M.D.   On: 09/21/2017 18:30   Ct Head Wo Contrast  Result Date: 09/21/2017 CLINICAL DATA:  Altered mental status. EXAM: CT HEAD WITHOUT CONTRAST TECHNIQUE: Contiguous axial images were obtained from the base of the skull through the vertex without intravenous contrast. COMPARISON:  CT head and MRI brain 03/18/2017. FINDINGS: Brain: There is a new large area of encephalomalacia within the right occipital lobe consistent with an interval stroke (axial images 13 through 17/2). No evidence of acute intracranial hemorrhage, mass lesion, brain edema or extra-axial fluid collection. Patchy low-density in the periventricular white matter appears stable. The ventricles and subarachnoid spaces are appropriately sized for age. There is no CT  evidence of acute cortical infarction. Vascular: Intracranial vascular calcifications. No hyperdense vessel identified. Skull: Negative for fracture or focal lesion. Sinuses/Orbits: The visualized paranasal sinuses and mastoid air cells are clear. Hypoplastic frontal sinuses. No orbital abnormalities are seen. Other: None. IMPRESSION: 1. No acute intracranial findings. 2.  Interval development of a large area of encephalomalacia in the right occipital lobe consistent with a stroke sustained over the last 6 months. This has no definite acute components but could be further evaluated with MRI if clinically warranted. Electronically Signed   By: Richardean Sale M.D.   On: 09/21/2017 14:58   Ct Angio Neck W Or Wo Contrast  Result Date: 09/21/2017 CLINICAL DATA:  Initial evaluation for acute aphasia, stroke. EXAM: CT ANGIOGRAPHY HEAD AND NECK CT PERFUSION BRAIN TECHNIQUE: Multidetector CT imaging of the head and neck was performed using the standard protocol during bolus administration of intravenous contrast. Multiplanar CT image reconstructions and MIPs were obtained to evaluate the vascular anatomy. Carotid stenosis measurements (when applicable) are obtained utilizing NASCET criteria, using the distal internal carotid diameter as the denominator. Multiphase CT imaging of the brain was performed following IV bolus contrast injection. Subsequent parametric perfusion maps were calculated using RAPID software. CONTRAST:  65mL ISOVUE-370 IOPAMIDOL (ISOVUE-370) INJECTION 76% COMPARISON:  Prior CT from earlier the same day. FINDINGS: CTA NECK FINDINGS Aortic arch: Visualized aortic arch ectatic with normal 3 vessel morphology. Mild atheromatous plaque within the aortic arch. No hemodynamically significant stenosis about the origin of the great vessels. Visualized subclavian arteries tortuous proximally but widely patent without stenosis. Right carotid system: Right common carotid artery tortuous proximally but widely  patent to the bifurcation. A centric plaque about the bifurcation without hemodynamically significant stenosis. Right ICA mildly tortuous but widely patent to the skull base without stenosis, dissection, or occlusion. Left carotid system: Left common carotid artery tortuous proximally but widely patent to the bifurcation without stenosis. No significant atheromatous narrowing about the left bifurcation. Left ICA tortuous and partially medialized into the retropharyngeal space. Left ICA patent to the skull base without stenosis, dissection, or occlusion. Vertebral arteries: Both of the vertebral arteries arise from the subclavian arteries. Left vertebral artery dominant. Vertebral arteries tortuous proximally but widely patent to the skull base without stenosis, dissection, or occlusion. Skeleton: No acute osseous abnormality. No worrisome lytic or blastic osseous lesions. Other neck: No acute soft tissue abnormality within the neck. Salivary glands normal. Thyroid normal. No adenopathy. Upper chest: Shotty subcentimeter lymph nodes noted within the visualized upper mediastinum. Scattered atelectatic changes present within the partially visualized lungs. Review of the MIP images confirms the above findings CTA HEAD FINDINGS Anterior circulation: Petrous segments widely patent bilaterally. Mild scattered atheromatous plaque within the cavernous/supraclinoid ICAs without stenosis. ICA termini widely patent. A1 segments patent bilaterally. Left A1 hypoplastic. Normal anterior communicating artery. Anterior cerebral arteries patent to their distal aspects without flow-limiting stenosis. Left M1 patent without stenosis. Normal left MCA bifurcation. There is a proximal left M3 occlusion rising off the inferior division (series 8, image 96). Adjacent inferior M3 branch remains patent. Superior left MCA division remains patent although distally appears attenuated in somewhat irregular. Right M1 mildly irregular and diffusely  narrowed without high-grade or flow-limiting stenosis. Normal right MCA bifurcation. No proximal right M2 occlusion. Distal small vessel atheromatous irregularity present throughout the right MCA branches. Posterior circulation: V4 segments patent to the vertebrobasilar junction without flow-limiting stenosis. Left vertebral artery dominant. Posterior inferior cerebral arteries patent bilaterally. Basilar widely patent to its distal aspect without stenosis. Superior cerebral arteries patent bilaterally. Both of the posterior cerebral arteries supplied via the basilar. Scattered atheromatous irregularity throughout the mid and distal PCAs without high-grade correctable stenosis. Venous sinuses: Not well assessed due to arterial timing of the contrast bolus. Anatomic variants: None significant.  No  aneurysm. Delayed phase: Not performed. Review of the MIP images confirms the above findings CT Brain Perfusion Findings: CBF (<30%) Volume: 65mL Perfusion (Tmax>6.0s) volume: 68mL Mismatch Volume: 9mL Infarction Location:Ischemic core infarct involves the left temporoccipital region. Fairly small amount of surrounding ischemic penumbra. IMPRESSION: 1. Acute left M3 occlusion, likely embolic. Associated core infarct involving the left temporoccipital region with relatively small volume 11 mL surrounding ischemic penumbra. 2. Atheromatous change involving the intracranial circulation, most notable at the right M1 segment and bilateral PCAs. No hemodynamically significant or correctable stenosis identified. 3. Mild for age atherosclerotic change about the carotid bifurcations without high-grade stenosis. 4. Diffuse tortuosity of the major arterial vasculature of the neck, suggesting chronic underlying hypertension. Critical Value/emergent results were called by telephone at the time of interpretation on 09/21/2017 at 5:59 pm to Dr. Cheral Marker, who verbally acknowledged these results. Electronically Signed   By: Jeannine Boga M.D.   On: 09/21/2017 18:30   Ct Cerebral Perfusion W Contrast  Result Date: 09/21/2017 CLINICAL DATA:  Initial evaluation for acute aphasia, stroke. EXAM: CT ANGIOGRAPHY HEAD AND NECK CT PERFUSION BRAIN TECHNIQUE: Multidetector CT imaging of the head and neck was performed using the standard protocol during bolus administration of intravenous contrast. Multiplanar CT image reconstructions and MIPs were obtained to evaluate the vascular anatomy. Carotid stenosis measurements (when applicable) are obtained utilizing NASCET criteria, using the distal internal carotid diameter as the denominator. Multiphase CT imaging of the brain was performed following IV bolus contrast injection. Subsequent parametric perfusion maps were calculated using RAPID software. CONTRAST:  72mL ISOVUE-370 IOPAMIDOL (ISOVUE-370) INJECTION 76% COMPARISON:  Prior CT from earlier the same day. FINDINGS: CTA NECK FINDINGS Aortic arch: Visualized aortic arch ectatic with normal 3 vessel morphology. Mild atheromatous plaque within the aortic arch. No hemodynamically significant stenosis about the origin of the great vessels. Visualized subclavian arteries tortuous proximally but widely patent without stenosis. Right carotid system: Right common carotid artery tortuous proximally but widely patent to the bifurcation. A centric plaque about the bifurcation without hemodynamically significant stenosis. Right ICA mildly tortuous but widely patent to the skull base without stenosis, dissection, or occlusion. Left carotid system: Left common carotid artery tortuous proximally but widely patent to the bifurcation without stenosis. No significant atheromatous narrowing about the left bifurcation. Left ICA tortuous and partially medialized into the retropharyngeal space. Left ICA patent to the skull base without stenosis, dissection, or occlusion. Vertebral arteries: Both of the vertebral arteries arise from the subclavian arteries. Left  vertebral artery dominant. Vertebral arteries tortuous proximally but widely patent to the skull base without stenosis, dissection, or occlusion. Skeleton: No acute osseous abnormality. No worrisome lytic or blastic osseous lesions. Other neck: No acute soft tissue abnormality within the neck. Salivary glands normal. Thyroid normal. No adenopathy. Upper chest: Shotty subcentimeter lymph nodes noted within the visualized upper mediastinum. Scattered atelectatic changes present within the partially visualized lungs. Review of the MIP images confirms the above findings CTA HEAD FINDINGS Anterior circulation: Petrous segments widely patent bilaterally. Mild scattered atheromatous plaque within the cavernous/supraclinoid ICAs without stenosis. ICA termini widely patent. A1 segments patent bilaterally. Left A1 hypoplastic. Normal anterior communicating artery. Anterior cerebral arteries patent to their distal aspects without flow-limiting stenosis. Left M1 patent without stenosis. Normal left MCA bifurcation. There is a proximal left M3 occlusion rising off the inferior division (series 8, image 96). Adjacent inferior M3 branch remains patent. Superior left MCA division remains patent although distally appears attenuated in somewhat irregular. Right M1 mildly irregular  and diffusely narrowed without high-grade or flow-limiting stenosis. Normal right MCA bifurcation. No proximal right M2 occlusion. Distal small vessel atheromatous irregularity present throughout the right MCA branches. Posterior circulation: V4 segments patent to the vertebrobasilar junction without flow-limiting stenosis. Left vertebral artery dominant. Posterior inferior cerebral arteries patent bilaterally. Basilar widely patent to its distal aspect without stenosis. Superior cerebral arteries patent bilaterally. Both of the posterior cerebral arteries supplied via the basilar. Scattered atheromatous irregularity throughout the mid and distal PCAs  without high-grade correctable stenosis. Venous sinuses: Not well assessed due to arterial timing of the contrast bolus. Anatomic variants: None significant.  No aneurysm. Delayed phase: Not performed. Review of the MIP images confirms the above findings CT Brain Perfusion Findings: CBF (<30%) Volume: 7mL Perfusion (Tmax>6.0s) volume: 68mL Mismatch Volume: 68mL Infarction Location:Ischemic core infarct involves the left temporoccipital region. Fairly small amount of surrounding ischemic penumbra. IMPRESSION: 1. Acute left M3 occlusion, likely embolic. Associated core infarct involving the left temporoccipital region with relatively small volume 11 mL surrounding ischemic penumbra. 2. Atheromatous change involving the intracranial circulation, most notable at the right M1 segment and bilateral PCAs. No hemodynamically significant or correctable stenosis identified. 3. Mild for age atherosclerotic change about the carotid bifurcations without high-grade stenosis. 4. Diffuse tortuosity of the major arterial vasculature of the neck, suggesting chronic underlying hypertension. Critical Value/emergent results were called by telephone at the time of interpretation on 09/21/2017 at 5:59 pm to Dr. Cheral Marker, who verbally acknowledged these results. Electronically Signed   By: Jeannine Boga M.D.   On: 09/21/2017 18:30    DISCHARGE EXAMINATION: Vitals:   09/29/17 1156 09/30/17 0112 09/30/17 0610 09/30/17 0724  BP: (!) 144/75 135/76 140/66 (!) 157/90  Pulse: 73 78 74 72  Resp: 18 16 18 16   Temp: 97.7 F (36.5 C) (!) 97.4 F (36.3 C) 97.6 F (36.4 C) (!) 97.5 F (36.4 C)  TempSrc: Oral Oral Oral Axillary  SpO2: 98% 99% 96% 97%  Weight:      Height:       General appearance: alert, cooperative, appears stated age and no distress Resp: clear to auscultation bilaterally Cardio: regular rate and rhythm, S1, S2 normal, no murmur, click, rub or gallop GI: soft, non-tender; bowel sounds normal; no masses,  no  organomegaly  DISPOSITION: SNF  Discharge Instructions    Ambulatory referral to Neurology   Complete by:  As directed    Follow up with stroke clinic NP (Jessica Vanschaick or Cecille Rubin, if both not available, consider Dr. Antony Contras, Dr. Bess Harvest, or Dr. Sarina Ill) at [facility] in about 4 weeks. - anticipate d/c to rehab or SNF in a few days   Discharge instructions   Complete by:  As directed    Follow with Primary MD Darcus Austin, MD in 7 days   Get CBC, CMP,  checked  by Primary MD or SNF MD in 5-7 days   Activity: As tolerated with Full fall precautions use walker/cane & assistance as needed  Disposition CIR/SNF  Diet:   Dysphagia 1 diet with feeding assistance and aspiration precautions.  For Heart failure patients - Check your Weight same time everyday, if you gain over 2 pounds, or you develop in leg swelling, experience more shortness of breath or chest pain, call your Primary MD immediately. Follow Cardiac Low Salt Diet and 1.5 lit/day fluid restriction.   Increase activity slowly   Complete by:  As directed         Allergies as  of 09/30/2017      Reactions   Contrast Media [iodinated Diagnostic Agents] Anaphylaxis   Fluorescein Anaphylaxis, Shortness Of Breath, Itching, Swelling   Hydralazine Hcl Anaphylaxis, Swelling, Palpitations, Rash   Sulfa Antibiotics Rash, Other (See Comments)   Other Reaction: red bumps   Ultram [tramadol] Hives   Yellow Dye Anaphylaxis, Itching, Swelling, Other (See Comments)   Fluorescein (in eye drops)   Buprenex [buprenorphine Hcl] Palpitations, Other (See Comments)   Keflex [cephalexin] Diarrhea   Lipitor [atorvastatin] Other (See Comments)   Muscle weakness   Restasis [cyclosporine] Swelling   redness of face with slight swelling    Augmentin [amoxicillin-pot Clavulanate] Nausea And Vomiting   Levofloxacin Other (See Comments)   Reaction not recalled   Morphine And Related Other (See  Comments)   agitation    Percocet [oxycodone-acetaminophen] Nausea And Vomiting   Tape Other (See Comments)   Adhesive tape pulls off the skin Able to tolerate paper tape   Zetia [ezetimibe] Other (See Comments)   Myalgias      Medication List    STOP taking these medications   SYSTANE 0.4-0.3 % Gel ophthalmic gel Generic drug:  Polyethyl Glycol-Propyl Glycol Replaced by:  artificial tears Oint ophthalmic ointment You also have another medication with the same name that you need to continue taking as instructed.     TAKE these medications   apixaban 5 MG Tabs tablet Commonly known as:  ELIQUIS Take 1 tablet (5 mg total) by mouth 2 (two) times daily.   artificial tears Oint ophthalmic ointment Commonly known as:  LACRILUBE Place 1 application into both eyes at bedtime. Replaces:  SYSTANE 0.4-0.3 % Gel ophthalmic gel   diltiazem 60 MG tablet Commonly known as:  CARDIZEM Take 1 tablet (60 mg total) by mouth every 6 (six) hours.   Lacosamide 100 MG Tabs Commonly known as:  VIMPAT Take 1 tablet (100 mg total) by mouth 2 (two) times daily.   Magnesium 250 MG Tabs Take 250 mg by mouth at bedtime.   Melatonin 1 MG Subl Place 1 mg under the tongue at bedtime.   MULTI VITAMIN DAILY PO Take 1 tablet by mouth every morning.   nebivolol 2.5 MG tablet Commonly known as:  BYSTOLIC Take 1 tablet (2.5 mg total) by mouth 2 (two) times daily.   nitroGLYCERIN 0.4 MG SL tablet Commonly known as:  NITROSTAT Place 1 tablet (0.4 mg total) under the tongue every 5 (five) minutes as needed for chest pain.   pantoprazole 40 MG tablet Commonly known as:  PROTONIX Take 40 mg by mouth daily.   pravastatin 20 MG tablet Commonly known as:  PRAVACHOL Take 1 tablet (20 mg total) by mouth daily at 6 PM.   prednisoLONE acetate 1 % ophthalmic suspension Commonly known as:  PRED FORTE Place 1 drop into both eyes See admin instructions. 1 drop into both eyes at bedtime spaced 5 minutes apart  from Systane gel   PRESCRIPTION MEDICATION Place 1 drop into the right eye 4 (four) times daily. [place] [id number]   Laray Anger MD  09/13/17 Serum, Autologous 50% drop   PRESERVISION AREDS 2 Caps Take 1 capsule by mouth 2 (two) times daily.   SYSTANE ULTRA 0.4-0.3 % Soln Generic drug:  Polyethyl Glycol-Propyl Glycol Apply 1 drop to eye See admin instructions. Twice daily as needed and at bedtime for irritation What changed:  Another medication with the same name was removed. Continue taking this medication, and follow the directions you  see here.         Contact information for follow-up providers    [name], MD Follow up on 12/01/2017.   Specialty:  Cardiology Why:  Please arrive 15 minutes early for your 3:20pm appointment. Contact information: [address] [place] [address] [phone]        [facility] Follow up in 4 week(s).   Specialty:  Neurology Why:  stroke clinic. office will call with appt date and time. Contact information: [address] [place] [phone]       Darcus Austin, MD. Schedule an appointment as soon as possible for a visit in 1 week(s).   Specialty:  Family Medicine Contact information: [place] [address] [phone]            Contact information for after-discharge care    Destination    [facility] .   Service:  Skilled Nursing Contact information: [address] [place] [phone]                  TOTAL DISCHARGE TIME: 35 mins  [facility] Pager [phone]  09/30/2017, 9:46 AM

## 2017-10-01 DIAGNOSIS — I2510 Atherosclerotic heart disease of native coronary artery without angina pectoris: Secondary | ICD-10-CM | POA: Diagnosis not present

## 2017-10-01 DIAGNOSIS — H5442A3 Blindness left eye category 3, normal vision right eye: Secondary | ICD-10-CM | POA: Diagnosis not present

## 2017-10-01 DIAGNOSIS — R1310 Dysphagia, unspecified: Secondary | ICD-10-CM | POA: Diagnosis not present

## 2017-10-01 DIAGNOSIS — E876 Hypokalemia: Secondary | ICD-10-CM | POA: Diagnosis not present

## 2017-10-01 DIAGNOSIS — I480 Paroxysmal atrial fibrillation: Secondary | ICD-10-CM | POA: Diagnosis not present

## 2017-10-01 DIAGNOSIS — I10 Essential (primary) hypertension: Secondary | ICD-10-CM | POA: Diagnosis not present

## 2017-10-01 DIAGNOSIS — M6281 Muscle weakness (generalized): Secondary | ICD-10-CM | POA: Diagnosis not present

## 2017-10-01 DIAGNOSIS — I471 Supraventricular tachycardia: Secondary | ICD-10-CM | POA: Diagnosis not present

## 2017-10-01 DIAGNOSIS — G9341 Metabolic encephalopathy: Secondary | ICD-10-CM | POA: Diagnosis not present

## 2017-10-01 DIAGNOSIS — G4089 Other seizures: Secondary | ICD-10-CM | POA: Diagnosis not present

## 2017-10-01 DIAGNOSIS — K219 Gastro-esophageal reflux disease without esophagitis: Secondary | ICD-10-CM | POA: Diagnosis not present

## 2017-10-01 DIAGNOSIS — I6389 Other cerebral infarction: Secondary | ICD-10-CM | POA: Diagnosis not present

## 2017-10-04 DIAGNOSIS — M6281 Muscle weakness (generalized): Secondary | ICD-10-CM | POA: Diagnosis not present

## 2017-10-04 DIAGNOSIS — I471 Supraventricular tachycardia: Secondary | ICD-10-CM | POA: Diagnosis not present

## 2017-10-04 DIAGNOSIS — I480 Paroxysmal atrial fibrillation: Secondary | ICD-10-CM | POA: Diagnosis not present

## 2017-10-04 DIAGNOSIS — I6389 Other cerebral infarction: Secondary | ICD-10-CM | POA: Diagnosis not present

## 2017-10-04 DIAGNOSIS — K219 Gastro-esophageal reflux disease without esophagitis: Secondary | ICD-10-CM | POA: Diagnosis not present

## 2017-10-04 DIAGNOSIS — G9341 Metabolic encephalopathy: Secondary | ICD-10-CM | POA: Diagnosis not present

## 2017-10-04 DIAGNOSIS — I2510 Atherosclerotic heart disease of native coronary artery without angina pectoris: Secondary | ICD-10-CM | POA: Diagnosis not present

## 2017-10-04 DIAGNOSIS — H5442A3 Blindness left eye category 3, normal vision right eye: Secondary | ICD-10-CM | POA: Diagnosis not present

## 2017-10-04 DIAGNOSIS — I10 Essential (primary) hypertension: Secondary | ICD-10-CM | POA: Diagnosis not present

## 2017-10-04 DIAGNOSIS — R1310 Dysphagia, unspecified: Secondary | ICD-10-CM | POA: Diagnosis not present

## 2017-10-04 DIAGNOSIS — E876 Hypokalemia: Secondary | ICD-10-CM | POA: Diagnosis not present

## 2017-10-04 DIAGNOSIS — G4089 Other seizures: Secondary | ICD-10-CM | POA: Diagnosis not present

## 2017-10-05 DIAGNOSIS — I480 Paroxysmal atrial fibrillation: Secondary | ICD-10-CM | POA: Diagnosis not present

## 2017-10-05 DIAGNOSIS — H5442A3 Blindness left eye category 3, normal vision right eye: Secondary | ICD-10-CM | POA: Diagnosis not present

## 2017-10-05 DIAGNOSIS — I2510 Atherosclerotic heart disease of native coronary artery without angina pectoris: Secondary | ICD-10-CM | POA: Diagnosis not present

## 2017-10-05 DIAGNOSIS — R1310 Dysphagia, unspecified: Secondary | ICD-10-CM | POA: Diagnosis not present

## 2017-10-05 DIAGNOSIS — M6281 Muscle weakness (generalized): Secondary | ICD-10-CM | POA: Diagnosis not present

## 2017-10-05 DIAGNOSIS — G9341 Metabolic encephalopathy: Secondary | ICD-10-CM | POA: Diagnosis not present

## 2017-10-05 DIAGNOSIS — E785 Hyperlipidemia, unspecified: Secondary | ICD-10-CM | POA: Diagnosis not present

## 2017-10-05 DIAGNOSIS — I6389 Other cerebral infarction: Secondary | ICD-10-CM | POA: Diagnosis not present

## 2017-10-05 DIAGNOSIS — G4089 Other seizures: Secondary | ICD-10-CM | POA: Diagnosis not present

## 2017-10-05 DIAGNOSIS — K219 Gastro-esophageal reflux disease without esophagitis: Secondary | ICD-10-CM | POA: Diagnosis not present

## 2017-10-05 DIAGNOSIS — E876 Hypokalemia: Secondary | ICD-10-CM | POA: Diagnosis not present

## 2017-10-05 DIAGNOSIS — I10 Essential (primary) hypertension: Secondary | ICD-10-CM | POA: Diagnosis not present

## 2017-10-08 DIAGNOSIS — H5442A3 Blindness left eye category 3, normal vision right eye: Secondary | ICD-10-CM | POA: Diagnosis not present

## 2017-10-08 DIAGNOSIS — K219 Gastro-esophageal reflux disease without esophagitis: Secondary | ICD-10-CM | POA: Diagnosis not present

## 2017-10-08 DIAGNOSIS — I2510 Atherosclerotic heart disease of native coronary artery without angina pectoris: Secondary | ICD-10-CM | POA: Diagnosis not present

## 2017-10-08 DIAGNOSIS — G4089 Other seizures: Secondary | ICD-10-CM | POA: Diagnosis not present

## 2017-10-08 DIAGNOSIS — G4700 Insomnia, unspecified: Secondary | ICD-10-CM | POA: Diagnosis not present

## 2017-10-08 DIAGNOSIS — I480 Paroxysmal atrial fibrillation: Secondary | ICD-10-CM | POA: Diagnosis not present

## 2017-10-08 DIAGNOSIS — G9341 Metabolic encephalopathy: Secondary | ICD-10-CM | POA: Diagnosis not present

## 2017-10-08 DIAGNOSIS — I10 Essential (primary) hypertension: Secondary | ICD-10-CM | POA: Diagnosis not present

## 2017-10-08 DIAGNOSIS — M6281 Muscle weakness (generalized): Secondary | ICD-10-CM | POA: Diagnosis not present

## 2017-10-08 DIAGNOSIS — R1310 Dysphagia, unspecified: Secondary | ICD-10-CM | POA: Diagnosis not present

## 2017-10-08 DIAGNOSIS — I6389 Other cerebral infarction: Secondary | ICD-10-CM | POA: Diagnosis not present

## 2017-10-08 DIAGNOSIS — E785 Hyperlipidemia, unspecified: Secondary | ICD-10-CM | POA: Diagnosis not present

## 2017-10-13 DIAGNOSIS — I480 Paroxysmal atrial fibrillation: Secondary | ICD-10-CM | POA: Diagnosis not present

## 2017-10-13 DIAGNOSIS — K219 Gastro-esophageal reflux disease without esophagitis: Secondary | ICD-10-CM | POA: Diagnosis not present

## 2017-10-13 DIAGNOSIS — R112 Nausea with vomiting, unspecified: Secondary | ICD-10-CM | POA: Diagnosis not present

## 2017-10-13 DIAGNOSIS — R109 Unspecified abdominal pain: Secondary | ICD-10-CM | POA: Diagnosis not present

## 2017-10-14 DIAGNOSIS — K219 Gastro-esophageal reflux disease without esophagitis: Secondary | ICD-10-CM | POA: Diagnosis not present

## 2017-10-14 DIAGNOSIS — I6389 Other cerebral infarction: Secondary | ICD-10-CM | POA: Diagnosis not present

## 2017-10-14 DIAGNOSIS — R1310 Dysphagia, unspecified: Secondary | ICD-10-CM | POA: Diagnosis not present

## 2017-10-14 DIAGNOSIS — I480 Paroxysmal atrial fibrillation: Secondary | ICD-10-CM | POA: Diagnosis not present

## 2017-10-14 DIAGNOSIS — G4089 Other seizures: Secondary | ICD-10-CM | POA: Diagnosis not present

## 2017-10-14 DIAGNOSIS — E876 Hypokalemia: Secondary | ICD-10-CM | POA: Diagnosis not present

## 2017-10-14 DIAGNOSIS — M6281 Muscle weakness (generalized): Secondary | ICD-10-CM | POA: Diagnosis not present

## 2017-10-14 DIAGNOSIS — I471 Supraventricular tachycardia: Secondary | ICD-10-CM | POA: Diagnosis not present

## 2017-10-14 DIAGNOSIS — G9341 Metabolic encephalopathy: Secondary | ICD-10-CM | POA: Diagnosis not present

## 2017-10-14 DIAGNOSIS — I10 Essential (primary) hypertension: Secondary | ICD-10-CM | POA: Diagnosis not present

## 2017-10-14 DIAGNOSIS — I2510 Atherosclerotic heart disease of native coronary artery without angina pectoris: Secondary | ICD-10-CM | POA: Diagnosis not present

## 2017-10-14 DIAGNOSIS — H5442A3 Blindness left eye category 3, normal vision right eye: Secondary | ICD-10-CM | POA: Diagnosis not present

## 2017-10-16 DIAGNOSIS — R1312 Dysphagia, oropharyngeal phase: Secondary | ICD-10-CM | POA: Diagnosis not present

## 2017-10-16 DIAGNOSIS — I69391 Dysphagia following cerebral infarction: Secondary | ICD-10-CM | POA: Diagnosis not present

## 2017-10-16 DIAGNOSIS — H540X33 Blindness right eye category 3, blindness left eye category 3: Secondary | ICD-10-CM | POA: Diagnosis not present

## 2017-10-16 DIAGNOSIS — I480 Paroxysmal atrial fibrillation: Secondary | ICD-10-CM | POA: Diagnosis not present

## 2017-10-16 DIAGNOSIS — K219 Gastro-esophageal reflux disease without esophagitis: Secondary | ICD-10-CM | POA: Diagnosis not present

## 2017-10-16 DIAGNOSIS — G40309 Generalized idiopathic epilepsy and epileptic syndromes, not intractable, without status epilepticus: Secondary | ICD-10-CM | POA: Diagnosis not present

## 2017-10-16 DIAGNOSIS — F419 Anxiety disorder, unspecified: Secondary | ICD-10-CM | POA: Diagnosis not present

## 2017-10-16 DIAGNOSIS — I69320 Aphasia following cerebral infarction: Secondary | ICD-10-CM | POA: Diagnosis not present

## 2017-10-16 DIAGNOSIS — I2510 Atherosclerotic heart disease of native coronary artery without angina pectoris: Secondary | ICD-10-CM | POA: Diagnosis not present

## 2017-10-16 DIAGNOSIS — M1991 Primary osteoarthritis, unspecified site: Secondary | ICD-10-CM | POA: Diagnosis not present

## 2017-10-16 DIAGNOSIS — I69398 Other sequelae of cerebral infarction: Secondary | ICD-10-CM | POA: Diagnosis not present

## 2017-10-16 DIAGNOSIS — I252 Old myocardial infarction: Secondary | ICD-10-CM | POA: Diagnosis not present

## 2017-10-16 DIAGNOSIS — I10 Essential (primary) hypertension: Secondary | ICD-10-CM | POA: Diagnosis not present

## 2017-10-18 ENCOUNTER — Emergency Department (HOSPITAL_BASED_OUTPATIENT_CLINIC_OR_DEPARTMENT_OTHER): Payer: PPO

## 2017-10-18 ENCOUNTER — Other Ambulatory Visit: Payer: Self-pay

## 2017-10-18 ENCOUNTER — Encounter (HOSPITAL_BASED_OUTPATIENT_CLINIC_OR_DEPARTMENT_OTHER): Payer: Self-pay | Admitting: *Deleted

## 2017-10-18 ENCOUNTER — Emergency Department (HOSPITAL_BASED_OUTPATIENT_CLINIC_OR_DEPARTMENT_OTHER)
Admission: EM | Admit: 2017-10-18 | Discharge: 2017-10-18 | Disposition: A | Discharge status: Home / Self Care | Payer: PPO | Attending: Emergency Medicine | Admitting: Emergency Medicine

## 2017-10-18 DIAGNOSIS — R0989 Other specified symptoms and signs involving the circulatory and respiratory systems: Secondary | ICD-10-CM

## 2017-10-18 DIAGNOSIS — Z79899 Other long term (current) drug therapy: Secondary | ICD-10-CM | POA: Diagnosis not present

## 2017-10-18 DIAGNOSIS — J029 Acute pharyngitis, unspecified: Secondary | ICD-10-CM | POA: Diagnosis present

## 2017-10-18 DIAGNOSIS — Z8673 Personal history of transient ischemic attack (TIA), and cerebral infarction without residual deficits: Secondary | ICD-10-CM | POA: Diagnosis not present

## 2017-10-18 DIAGNOSIS — I10 Essential (primary) hypertension: Secondary | ICD-10-CM | POA: Diagnosis not present

## 2017-10-18 DIAGNOSIS — R1310 Dysphagia, unspecified: Secondary | ICD-10-CM | POA: Diagnosis not present

## 2017-10-18 DIAGNOSIS — M795 Residual foreign body in soft tissue: Secondary | ICD-10-CM

## 2017-10-18 DIAGNOSIS — R0602 Shortness of breath: Secondary | ICD-10-CM | POA: Diagnosis not present

## 2017-10-18 DIAGNOSIS — I493 Ventricular premature depolarization: Secondary | ICD-10-CM | POA: Diagnosis not present

## 2017-10-18 DIAGNOSIS — I2510 Atherosclerotic heart disease of native coronary artery without angina pectoris: Secondary | ICD-10-CM | POA: Diagnosis not present

## 2017-10-18 LAB — CBC WITH DIFFERENTIAL/PLATELET
Basophils Absolute: 0 10*3/uL (ref 0.0–0.1)
Basophils Relative: 0 %
EOS ABS: 0.1 10*3/uL (ref 0.0–0.7)
Eosinophils Relative: 1 %
HCT: 43.7 % (ref 36.0–46.0)
Hemoglobin: 14.6 g/dL (ref 12.0–15.0)
Lymphocytes Relative: 34 %
Lymphs Abs: 2.2 10*3/uL (ref 0.7–4.0)
MCH: 31.3 pg (ref 26.0–34.0)
MCHC: 33.4 g/dL (ref 30.0–36.0)
MCV: 93.6 fL (ref 78.0–100.0)
Monocytes Absolute: 1 10*3/uL (ref 0.1–1.0)
Monocytes Relative: 14 %
NEUTROS PCT: 51 %
Neutro Abs: 3.4 10*3/uL (ref 1.7–7.7)
Platelets: 179 10*3/uL (ref 150–400)
RBC: 4.67 MIL/uL (ref 3.87–5.11)
RDW: 13.9 % (ref 11.5–15.5)
WBC: 6.6 10*3/uL (ref 4.0–10.5)

## 2017-10-18 LAB — BASIC METABOLIC PANEL
ANION GAP: 10 (ref 5–15)
BUN: 25 mg/dL — ABNORMAL HIGH (ref 8–23)
CO2: 26 mmol/L (ref 22–32)
CREATININE: 0.72 mg/dL (ref 0.44–1.00)
Calcium: 9.1 mg/dL (ref 8.9–10.3)
Chloride: 97 mmol/L — ABNORMAL LOW (ref 98–111)
GFR calc Af Amer: >60 mL/min (ref >60)
GFR calc non Af Amer: >60 mL/min (ref >60)
Glucose, Bld: 124 mg/dL — ABNORMAL HIGH (ref 70–99)
Potassium: 4.1 mmol/L (ref 3.5–5.1)
Sodium: 133 mmol/L — ABNORMAL LOW (ref 135–145)

## 2017-10-18 LAB — TROPONIN I

## 2017-10-18 MED ORDER — SUCRALFATE 1 GM/10ML PO SUSP: 1.0000 g | Freq: Three times a day (TID) | ORAL | 0 refills | Status: DC

## 2017-10-18 MED ORDER — SUCRALFATE 1 GM/10ML PO SUSP
1.0000 g | Freq: Three times a day (TID) | ORAL | Status: DC
  Administered 2017-10-18: 1 g via ORAL
  Filled 2017-10-18: qty 10

## 2017-10-18 MED ORDER — LIDOCAINE VISCOUS HCL 2 % MT SOLN
15.0000 mL | Freq: Once | OROMUCOSAL | Status: AC
  Administered 2017-10-18: 15 mL via OROMUCOSAL
  Filled 2017-10-18: qty 15

## 2017-10-18 MED ORDER — LORAZEPAM 2 MG/ML IJ SOLN
0.5000 mg | Freq: Once | INTRAMUSCULAR | Status: AC
  Administered 2017-10-18: 0.5 mg via INTRAVENOUS
  Filled 2017-10-18: qty 1

## 2017-10-18 MED ORDER — SUCRALFATE 1 G PO TABS
1.0000 g | ORAL_TABLET | Freq: Once | ORAL | Status: AC
  Administered 2017-10-18: 1 g via ORAL
  Filled 2017-10-18: qty 1

## 2017-10-18 MED ORDER — ALUM & MAG HYDROXIDE-SIMETH 200-200-20 MG/5ML PO SUSP
15.0000 mL | Freq: Once | ORAL | Status: AC
  Administered 2017-10-18: 15 mL via ORAL
  Filled 2017-10-18: qty 30

## 2017-10-18 NOTE — ED Notes (Signed)
Pt very anxious- per family not verbal due to previous stroke. Pt rocking back and forth, moaning and holding throat. Pt able to speak "please". Pt able to swallow GI cocktail with no difficulties, coughing, or SOB. Pt SpO2 98%. Pt able to be reassured by family at bedside.

## 2017-10-18 NOTE — ED Triage Notes (Signed)
Tonight she started holding her neck and appeared to not be able to swallow. She is able to drink water without difficulty. She did not eat dinner tonight. She is moaning and rocking while sitting in the wheelchair.

## 2017-10-18 NOTE — Patient Outreach (Addendum)
[facility]) Care Management  10/18/2017  Seylah Wernert 04/12/1924 [id number]  Transition of Care Referral Referral Date: 10/18/17 Referral Source: [facility] Discharge Report Discharge Date: 10/14/17 Diagnosis: cerebral embolism Discharge Facility: [facility]  Telephone Screen Referral Date: 10/18/17 Referral Source: [facility] UM Dept. Referral Reason: " member discharged abruptly from SNF to home with family, facility unsure if [facility] was ordered, member originally admitted due to recent stroke" Insurance: HTA   Outreach attempt #  1 to patient. No answer after multiple rings and unable to leave message. RN CM contacted daughter-[name] (DPR on file). Daughter voices that patient is doing fairly well. She states that she has only been home a few days. She is getting HHPT( caregiver unable to recall agency name). She reports that therapist was out Saturday to see patient and will be coming 2x/week. She reports that patient is up walking and moving around.Caregiver reports that her main concerned is to get patient set up with speech therapist as patient's stroke affected her speech. She is unsure if facility made ST referral but states he did mention it to them. Daughter advised to discuss this with PCP during follow up appt . She has not had a chance to make appt but will do so soon. Daughter confirmed that she has all of patient's meds and has already contacted MD regarding Eliquis as patient reports abdominal pain when she takes meds. No issues with transportation. Daughter familiar with [facility] services as she has had services in the past. She does not feel like they need [facility] services at this time.Patient has very supportive and involved family assisting her with all her needs. Daughter shares that patient did not leave facility AMA. She states that she gave them a one day notice that she as taking patient home due to the poor care that patient was receiving at  facility. Caregivers shares with RN CM that she has "a list of concerns" that they encountered with facility while there. She reports that she notified several staff members(iincluding  PT, RN coordinator and MD) regarding her issues and concerns but nothing happened. Daughter reports she was so concerned regarding her mother being there that she arranged and paid for 24 hr caregivers to be there with patient to ensure her needs were being met.       Plan: RN CM will close case at this time.    Enzo Montgomery, RN,BSN,CCM [facility] Telephonic Care Management Coordinator Direct Phone: [phone] Toll Free: [phone] Fax: [phone]

## 2017-10-18 NOTE — Discharge Instructions (Addendum)
You were brought into the emergency department tonight for left-sided neck pain.  you had x-rays EKG and blood work.  This pain is likely related to acid reflux and you seemed improved after some medication to help with that.  We are sending you home with a prescription for that but it will be important to follow-up with your doctor and return if any worsening symptoms.

## 2017-10-18 NOTE — ED Provider Notes (Signed)
[facility] EMERGENCY DEPARTMENT Provider Note   CSN: [id number] Arrival date & time: 10/18/17  2102     History   Chief Complaint No chief complaint on file.   HPI [name] [name] is a 84 y.o. female.  She is brought in by her family after she acutely had some distress tonight pointing to the left side of her neck.  Noted she seemed very agitated by this and she was a little sweaty.  Patient has difficulty communicating as she is recently had a stroke that left her mostly blind and with an expressive aphasia.  They initially tried a nitro without any relief and the patient has been drinking water here without any difficulty.  It is doubtful that she choked on anything as she is only had an Ensure for dinner.  She is been agitated before but never this much.  She has been troubled by reflux after they put her on Eliquis.  They noted this because she would belch a lot.  She has been off this medication since Wednesday.  She has not been eating much.  Level 5 caveat secondary to difficulty communicating /stroke  The history is provided by a relative.  Sore Throat  This is a new problem. The current episode started 1 to 2 hours ago. The problem occurs constantly. The problem has not changed since onset.Pertinent negatives include no chest pain, no abdominal pain, no headaches and no shortness of breath.    Past Medical History:  Diagnosis Date  . Arthritis    "thumbs, joints" (03/23/2017  . Basal cell carcinoma    "several; scattered over my face, hands, leg some cut off; some burned off"  . Blind left eye    a. Corneal transplant x3 with rejection  . CAD (coronary artery disease)    a. s/p NSTEMI 2015 treated conservatively; nuclear stress test low risk. b. Continued angina despite med rx 07/2015 - s/p Bio Freedom Stent to ramus intermedius with mod LAD stenosis neg by FFR. // Nuc study 5/19:  EF 67, no ischemia or scar; Low Risk   . Complication of anesthesia    "very sensitive to  RX"  . Diverticulitis large intestine   . Diverticulosis   . Family history of adverse reaction to anesthesia    "we all get PONV" (1/1/201)  . Gastritis   . GERD (gastroesophageal reflux disease)   . Hayfever   . Helicobacter pylori (H. pylori) 05/22/02   RUT-Positive  . Hypertension   . Myocardial infarction ([place]) 2015  . Squamous carcinoma    "several; scattered over my face, hands, leg some cut off; some burned off"  . Stroke [facility]) 2015   denies residual on 08/07/2015  . Stroke [facility]) 03/18/2017   "has effected her vision, balance, some memory" (03/23/2017)  . SVT (supraventricular tachycardia) (HCC)    a. seen by Dr. Caryl Comes - has loop recorder in. Patient has declined amiodarone due to side effect profile  . Varicose veins     Patient Active Problem List   Diagnosis Date Noted  . Diastolic dysfunction   . Hypokalemia   . Seizures ([place])   . PAF (paroxysmal atrial fibrillation) ([place])   . Cerebral embolism with cerebral infarction 09/21/2017  . Acute CVA (cerebrovascular accident) ([place]) 09/21/2017  . Blind 04/02/2017  . Visual field cut- right 04/02/2017  . Gastroesophageal reflux disease   . Pre-diabetes   . Blind left eye- (Fuch's disease) 03/05/2015  . Essential hypertension 12/19/2014  . CAD (coronary  artery disease) 12/19/2014  . History of Rt brain stroke Oct 2015 06/14/2014  . Headache 06/14/2014  . Malignant hypertension 01/20/2014  . Aphasia 01/04/2014  . Paresthesia 01/04/2014  . Generalized abdominal pain 01/01/2014  . History of diverticulitis 01/01/2014  . Diaphoresis 12/29/2012  . Sleep disturbance 10/03/2012  . Depression 07/12/2012  . OA (osteoarthritis) of knee 06/27/2012  . History of PSVT 12/15/2011    Past Surgical History:  Procedure Laterality Date  . BASAL CELL CARCINOMA EXCISION     "several; scattered over my face, hands, leg some cut off; some burned off"  . CARDIAC CATHETERIZATION N/A 08/07/2015   Procedure: Left Heart Cath and Coronary  Angiography;  Surgeon: [name], MD;  Location: [facility];  Service: Cardiovascular;  Laterality: N/A;  . CARDIAC CATHETERIZATION N/A 08/07/2015   Procedure: Coronary Stent Intervention;  Surgeon: [name], MD;  Location: [facility] CV LAB;  Service: Cardiovascular;  Laterality: N/A;  . CARDIAC CATHETERIZATION N/A 08/07/2015   Procedure: Intravascular Pressure Wire/FFR Study;  Surgeon: [name], MD;  Location: [facility] CV LAB;  Service: Cardiovascular;  Laterality: N/A;  . CATARACT EXTRACTION W/ INTRAOCULAR LENS  IMPLANT, BILATERAL Bilateral 2005  . CLOSED REDUCTION SHOULDER DISLOCATION Left 2016 X 2  . CORNEAL TRANSPLANT Bilateral right 2009, left 2009    3 in left eye (last 2 failed), 1 in right eye  . DILATION AND CURETTAGE OF UTERUS    . EYE SURGERY    . JOINT REPLACEMENT    . [place]   "had gangrene in it"  . LOOP RECORDER IMPLANT N/A 01/05/2014   Procedure: LOOP RECORDER IMPLANT;  Surgeon: Coralyn Mark, MD;  Location: [facility] CATH LAB;  Service: Cardiovascular;  Laterality: N/A;  . SQUAMOUS CELL CARCINOMA EXCISION     "several; scattered over my face, hands, leg some cut off; some burned off"  . TONSILLECTOMY    . TOTAL ABDOMINAL HYSTERECTOMY  11/1983   "ovaries and all"  . TOTAL KNEE ARTHROPLASTY Left 06/27/2012   Procedure: LEFT TOTAL KNEE ARTHROPLASTY;  Surgeon: Gearlean Alf, MD;  Location: [facility];  Service: Orthopedics;  Laterality: Left;  . TOTAL KNEE ARTHROPLASTY Right 12/12/2012   Procedure: RIGHT TOTAL KNEE ARTHROPLASTY;  Surgeon: Gearlean Alf, MD;  Location: [facility];  Service: Orthopedics;  Laterality: Right;  . TUBAL LIGATION  1966     OB History   None      Home Medications    Prior to Admission medications   Medication Sig Start Date End Date Taking? Authorizing Provider  apixaban (ELIQUIS) 5 MG TABS tablet Take 1 tablet (5 mg total) by mouth 2 (two) times daily. 09/29/17   Thurnell Lose, MD  artificial  tears (LACRILUBE) OINT ophthalmic ointment Place 1 application into both eyes at bedtime. 09/28/17   Thurnell Lose, MD  diltiazem (CARDIZEM) 60 MG tablet Take 1 tablet (60 mg total) by mouth every 6 (six) hours. 09/28/17   Thurnell Lose, MD  Lacosamide (VIMPAT) 100 MG TABS Take 1 tablet (100 mg total) by mouth 2 (two) times daily. 09/28/17   Thurnell Lose, MD  Magnesium 250 MG TABS Take 250 mg by mouth at bedtime.    [provider]  Melatonin 1 MG SUBL Place 1 mg under the tongue at bedtime.    [provider]  Multiple Vitamin (MULTI VITAMIN DAILY PO) Take 1 tablet by mouth every morning.    [provider]  Multiple Vitamins-Minerals (  PRESERVISION AREDS 2) CAPS Take 1 capsule by mouth 2 (two) times daily.     [provider]  nebivolol (BYSTOLIC) 2.5 MG tablet Take 1 tablet (2.5 mg total) by mouth 2 (two) times daily. 08/10/17   Richardson Dopp T, PA-C  nitroGLYCERIN (NITROSTAT) 0.4 MG SL tablet Place 1 tablet (0.4 mg total) under the tongue every 5 (five) minutes as needed for chest pain. 08/08/15   Lyda Jester M, PA-C  pantoprazole (PROTONIX) 40 MG tablet Take 40 mg by mouth daily.     [provider]  Polyethyl Glycol-Propyl Glycol (SYSTANE ULTRA) 0.4-0.3 % SOLN Apply 1 drop to eye See admin instructions. Twice daily as needed and at bedtime for irritation    [provider]  pravastatin (PRAVACHOL) 20 MG tablet Take 1 tablet (20 mg total) by mouth daily at 6 PM. 09/28/17   Thurnell Lose, MD  prednisoLONE acetate (PRED FORTE) 1 % ophthalmic suspension Place 1 drop into both eyes See admin instructions. 1 drop into both eyes at bedtime spaced 5 minutes apart from Systane gel    [provider]  PRESCRIPTION MEDICATION Place 1 drop into the right eye 4 (four) times daily. [facility] RX [id number]   Laray Anger MD  09/13/17 Serum, Autologous 50% drop    [provider]    Family History Family  History  Problem Relation Age of Onset  . Stroke Mother   . Heart attack Father   . Heart attack Brother   . Cancer Brother   . Heart attack Brother   . Heart attack Brother   . Heart attack Brother   . Parkinsonism Brother     Social History Social History   Tobacco Use  . Smoking status: Never Smoker  . Smokeless tobacco: Never Used  Substance Use Topics  . Alcohol use: No  . Drug use: No     Allergies   Contrast media [iodinated diagnostic agents]; Fluorescein; Hydralazine hcl; Sulfa antibiotics; Ultram [tramadol]; Yellow dye; Buprenex [buprenorphine hcl]; Keflex [cephalexin]; Lipitor [atorvastatin]; Restasis [cyclosporine]; Augmentin [amoxicillin-pot clavulanate]; Levofloxacin; Morphine and related; Percocet [oxycodone-acetaminophen]; Tape; and Zetia [ezetimibe]   Review of Systems Review of Systems  Unable to perform ROS: Patient nonverbal  Respiratory: Negative for shortness of breath.   Cardiovascular: Negative for chest pain.  Gastrointestinal: Negative for abdominal pain.  Neurological: Negative for headaches.     Physical Exam Updated Vital Signs BP 109/68   Pulse 76   Temp 97.9 F (36.6 C) (Oral)   Resp (!) 24   Ht 5\' 3"  (1.6 m)   Wt 75.8 kg (167 lb)   SpO2 95%   BMI 29.58 kg/m   Physical Exam  Constitutional: She appears well-developed and well-nourished.  HENT:  Head: Normocephalic and atraumatic.  Right Ear: External ear normal.  Left Ear: External ear normal.  Nose: Nose normal.  Mouth/Throat: Oropharynx is clear and moist.  Eyes: Conjunctivae are normal. Right eye exhibits no discharge. Left eye exhibits no discharge.  Neck: Normal range of motion. Neck supple. No tracheal deviation present.  No stridor, able to swallow water without any difficulty.  Normal voice strength.  Cardiovascular: Normal rate, regular rhythm, normal heart sounds and intact distal pulses.  Pulmonary/Chest: Effort normal. No stridor. She has no wheezes. She has no  rales.  Abdominal: Soft. She exhibits no mass. There is no tenderness. There is no guarding.  Musculoskeletal: Normal range of motion. She exhibits no deformity.  Neurological: She is alert. GCS eye subscore  is 4. GCS verbal subscore is 5. GCS motor subscore is 6.  Patient is alert and moving all extremities.  She is blind.  She has some inconsistent speech but is difficult to understand some of it.  Skin: Skin is warm and dry. Capillary refill takes less than 2 seconds.  Psychiatric: She has a normal mood and affect.  Nursing note and vitals reviewed.    ED Treatments / Results  Labs (all labs ordered are listed, but only abnormal results are displayed) Labs Reviewed  BASIC METABOLIC PANEL - Abnormal; Notable for the following components:      Result Value   Sodium 133 (*)    Chloride 97 (*)    Glucose, Bld 124 (*)    BUN 25 (*)    All other components within normal limits  CBC WITH DIFFERENTIAL/PLATELET  TROPONIN I    EKG EKG Interpretation  Date/Time:  Monday October 18 2017 21:40:03 EDT Ventricular Rate:  76 PR Interval:    QRS Duration: 104 QT Interval:  411 QTC Calculation: 463 R Axis:   48 Text Interpretation:  Sinus rhythm Ventricular premature complex sinus has replaced afib from prior ecg 7/19 Confirmed by Aletta Edouard [phone]) on 10/18/2017 9:50:16 PM Also confirmed by Aletta Edouard [phone]), editor Lynder Parents [phone])  on 10/19/2017 9:20:28 AM   Radiology Dg Neck Soft Tissue  Result Date: 10/18/2017 CLINICAL DATA:  Foreign body sensation in throat EXAM: NECK SOFT TISSUES - 1+ VIEW COMPARISON:  None. FINDINGS: Limited evaluation due to overlying soft tissues. There is no evidence of retropharyngeal soft tissue swelling or epiglottic enlargement. The cervical airway is unremarkable and no radio-opaque foreign body identified. IMPRESSION: Limited evaluation. No radiopaque foreign body is seen.  Otherwise negative. Electronically Signed   By: Julian Hy  M.D.   On: 10/18/2017 22:26   Dg Chest Port 1 View  Result Date: 10/18/2017 CLINICAL DATA:  Shortness of breath, foreign body sensation EXAM: PORTABLE CHEST 1 VIEW COMPARISON:  03/05/2015 FINDINGS: Increased interstitial markings. No focal consolidation. No pleural effusion or pneumothorax. The heart is normal in size. Loop recorder overlying the left hemithorax. Degenerative changes of the thoracic spine. IMPRESSION: No evidence of acute cardiopulmonary disease. Electronically Signed   By: Julian Hy M.D.   On: 10/18/2017 22:26    Procedures Procedures (including critical care time)  Medications Ordered in ED Medications  alum & mag hydroxide-simeth (MAALOX/MYLANTA) 200-200-20 MG/5ML suspension 15 mL (has no administration in time range)  lidocaine (XYLOCAINE) 2 % viscous mouth solution 15 mL (has no administration in time range)     Initial Impression / Assessment and Plan / ED Course  I have reviewed the triage vital signs and the nursing notes.  Pertinent labs & imaging results that were available during my care of the patient were reviewed by me and considered in my medical decision making (see chart for details).  Clinical Course as of Oct 19 1005  Mon Oct 18, 2017  2220 Initial triage note said foreign body but it does not sound like there is any concern from the family that there is a foreign body involved.  She still is quite restless and continues to play in her throat.  The GI cocktail does not seem to really change this at all.  Her lab works back and her troponin is normal and there is no other obvious findings.  Her EKG is also unremarkable   [MB]  2221 Family is agreeable if we try to give her a  little bit of Ativan to see if we can calm her down.  I had plain film imaging of her neck and chest although she is so restless and doubting were getting any significant quality.   [MB]  2222 Patient is beginning to belch now so I still think this is most likely some sort of  GI issue and may be some esophageal spasm.  I was going to give some glucagon but family asked if he would just try the Ativan first and see if that helps.   [MB]  2302 Patient seems a little more comfortable after the Ativan.  I am also ordering her some Carafate to see if that helps with her symptoms.   [MB]    Clinical Course User Index [MB] Hayden Rasmussen, MD    Patient seems more comfortable and wants to go home. Family wants to take her home. Will prescribe carafate and encouraged them to followup with pcp and possibly GI.   Final Clinical Impressions(s) / ED Diagnoses   Final diagnoses:  Odynophagia    ED Discharge Orders        Ordered    sucralfate (CARAFATE) 1 GM/10ML suspension  3 times daily with meals & bedtime     10/18/17 2338       Hayden Rasmussen, MD 10/19/17 1009

## 2017-10-18 NOTE — ED Notes (Signed)
ED Provider at bedside. 

## 2017-10-19 DIAGNOSIS — I69391 Dysphagia following cerebral infarction: Secondary | ICD-10-CM | POA: Diagnosis not present

## 2017-10-19 DIAGNOSIS — I252 Old myocardial infarction: Secondary | ICD-10-CM | POA: Diagnosis not present

## 2017-10-19 DIAGNOSIS — I10 Essential (primary) hypertension: Secondary | ICD-10-CM | POA: Diagnosis not present

## 2017-10-19 DIAGNOSIS — M1991 Primary osteoarthritis, unspecified site: Secondary | ICD-10-CM | POA: Diagnosis not present

## 2017-10-19 DIAGNOSIS — I2510 Atherosclerotic heart disease of native coronary artery without angina pectoris: Secondary | ICD-10-CM | POA: Diagnosis not present

## 2017-10-19 DIAGNOSIS — R1312 Dysphagia, oropharyngeal phase: Secondary | ICD-10-CM | POA: Diagnosis not present

## 2017-10-19 DIAGNOSIS — I69320 Aphasia following cerebral infarction: Secondary | ICD-10-CM | POA: Diagnosis not present

## 2017-10-19 DIAGNOSIS — K219 Gastro-esophageal reflux disease without esophagitis: Secondary | ICD-10-CM | POA: Diagnosis not present

## 2017-10-19 DIAGNOSIS — F419 Anxiety disorder, unspecified: Secondary | ICD-10-CM | POA: Diagnosis not present

## 2017-10-19 DIAGNOSIS — G40309 Generalized idiopathic epilepsy and epileptic syndromes, not intractable, without status epilepticus: Secondary | ICD-10-CM | POA: Diagnosis not present

## 2017-10-19 DIAGNOSIS — I480 Paroxysmal atrial fibrillation: Secondary | ICD-10-CM | POA: Diagnosis not present

## 2017-10-19 DIAGNOSIS — I69398 Other sequelae of cerebral infarction: Secondary | ICD-10-CM | POA: Diagnosis not present

## 2017-10-19 DIAGNOSIS — H540X33 Blindness right eye category 3, blindness left eye category 3: Secondary | ICD-10-CM | POA: Diagnosis not present

## 2017-10-20 DIAGNOSIS — F411 Generalized anxiety disorder: Secondary | ICD-10-CM | POA: Diagnosis not present

## 2017-10-20 DIAGNOSIS — N390 Urinary tract infection, site not specified: Secondary | ICD-10-CM | POA: Diagnosis not present

## 2017-10-20 DIAGNOSIS — R109 Unspecified abdominal pain: Secondary | ICD-10-CM | POA: Diagnosis not present

## 2017-11-01 DIAGNOSIS — I69391 Dysphagia following cerebral infarction: Secondary | ICD-10-CM | POA: Diagnosis not present

## 2017-11-01 DIAGNOSIS — I2510 Atherosclerotic heart disease of native coronary artery without angina pectoris: Secondary | ICD-10-CM | POA: Diagnosis not present

## 2017-11-01 DIAGNOSIS — I10 Essential (primary) hypertension: Secondary | ICD-10-CM | POA: Diagnosis not present

## 2017-11-01 DIAGNOSIS — R1312 Dysphagia, oropharyngeal phase: Secondary | ICD-10-CM | POA: Diagnosis not present

## 2017-11-01 DIAGNOSIS — G40309 Generalized idiopathic epilepsy and epileptic syndromes, not intractable, without status epilepticus: Secondary | ICD-10-CM | POA: Diagnosis not present

## 2017-11-01 DIAGNOSIS — I252 Old myocardial infarction: Secondary | ICD-10-CM | POA: Diagnosis not present

## 2017-11-01 DIAGNOSIS — K219 Gastro-esophageal reflux disease without esophagitis: Secondary | ICD-10-CM | POA: Diagnosis not present

## 2017-11-01 DIAGNOSIS — F419 Anxiety disorder, unspecified: Secondary | ICD-10-CM | POA: Diagnosis not present

## 2017-11-01 DIAGNOSIS — H540X33 Blindness right eye category 3, blindness left eye category 3: Secondary | ICD-10-CM | POA: Diagnosis not present

## 2017-11-01 DIAGNOSIS — I69320 Aphasia following cerebral infarction: Secondary | ICD-10-CM | POA: Diagnosis not present

## 2017-11-01 DIAGNOSIS — I69398 Other sequelae of cerebral infarction: Secondary | ICD-10-CM | POA: Diagnosis not present

## 2017-11-01 DIAGNOSIS — I480 Paroxysmal atrial fibrillation: Secondary | ICD-10-CM | POA: Diagnosis not present

## 2017-11-01 DIAGNOSIS — M1991 Primary osteoarthritis, unspecified site: Secondary | ICD-10-CM | POA: Diagnosis not present

## 2017-11-02 ENCOUNTER — Encounter: Payer: Self-pay | Admitting: Adult Health

## 2017-11-02 ENCOUNTER — Ambulatory Visit: Payer: PPO | Admitting: Adult Health

## 2017-11-02 VITALS — BP 140/72 | HR 71 | Wt 166.7 lb

## 2017-11-02 DIAGNOSIS — F0151 Vascular dementia with behavioral disturbance: Secondary | ICD-10-CM | POA: Diagnosis not present

## 2017-11-02 DIAGNOSIS — F01518 Vascular dementia, unspecified severity, with other behavioral disturbance: Secondary | ICD-10-CM

## 2017-11-02 DIAGNOSIS — I10 Essential (primary) hypertension: Secondary | ICD-10-CM | POA: Diagnosis not present

## 2017-11-02 DIAGNOSIS — I6302 Cerebral infarction due to thrombosis of basilar artery: Secondary | ICD-10-CM

## 2017-11-02 MED ORDER — DIVALPROEX SODIUM 125 MG PO CSDR: 125.0000 mg | DELAYED_RELEASE_CAPSULE | Freq: Two times a day (BID) | ORAL | 0 refills | Status: DC

## 2017-11-02 NOTE — Progress Notes (Signed)
[facility] [address] [place]. [place] [address] [phone]       OFFICE FOLLOW UP NOTE  [name] Date of Birth:  Jul 18, 1924 Medical Record Number:  [id number]   Reason for Referral:  hospital stroke follow up  CHIEF COMPLAINT:  Chief Complaint  Patient presents with  . Follow-up    left temporal infarct felt to be embolic secondary to atrial fibrillation hospital follow up room 9 pt with, Pt was taken off eliquis due to abdomen pain, they spoke with Dr .Posey Pronto and he was aware, she is back plavix pt is with daughter  [name]     HPI: [name] [name] is being seen today for initial visit in the office for left temporal infarct on 09/21/17. History obtained from patient, son and chart review. Reviewed all radiology images and labs personally.  [name] is a 84 y.o. female with history of hypertension, SVT, CHF, CAD, legally blind left eye, prior strokes with hemianopia on Plavix with loop recorder that has shown 10 seconds of A. fib in past presenting with global aphasia and right homonymous hemianopsia.  CT head reviewed and was negative for acute findings but did show right occipital infarct that occurred in 02/2017.  CTA head and neck showed acute left M3 occlusion, associated left temporoparietal infarct with surrounding penumbra, intracranial atherosclerosis or M1 in the PCAs, carotid atherosclerosis and tuberosity of vessels in the neck.  MRI was unable to be performed due to uncooperation outpatient.  2D echo showed an EF of 60 to 65% without cardiac source of embolus.  LDL 95 and his patient was on statin PTA recommended to start Lipitor 20 mg daily. Patient was on plavix 75mg  PTA and recommended start Eliquis 5-7 days post stroke due to atrial fibrillation that was seen on telemetry during new onset seizure on 09/22/17 and recommended Vimpat at discharge. HTN stable and recommended long term BP goal. A1c satisfactory at 5.6.  Patient was discharged to  [facility] for continued therapies.   Patient is being seen today for hospital follow-up and is accompanied by her daughter.  She has been discharged from [facility] and is currently living with her daughter where she receives 24-hour care.  She continues to have visual impairment along with excessive memory loss with behavioral concerns.  Per daughter, patient did not have any memory loss prior to the stroke as she was able to do all activities independently.  The daughter has seen a slight improvement in her memory since hospital admission but she has increased concern with continuous agitation that can be worse in the evening but at times can start first thing in the morning and it will continue throughout the day.  PCP did start on Zoloft along with as needed to temazepam.  Daughter has been giving this as needed with increased agitation and she does feel as though it has been working well as it does not make patient lethargic but does decrease the amount of agitation and behavioral issues. Patient continues to receive home speech therapy but daughter is questioning how beneficial this is due to her new onset memory loss and agitation.  Throughout appointment, patient was very paranoid in regards to being unable to see papers that were under the laptop and was getting agitated for not understanding the conversation that was occurring and was continuously hitting her daughter with a pamphlet.  Per daughter, patient is showing minimal amount of agitation than what she normally would be at home.  Difficult to redirect patient during increased agitation.  During assessment, patient was unable to cooperate due to not understanding directions.  Patient unable to state her name, birthdate or current date.  Unable to assess level of expressive and/or receptive aphasia or accurate level of memory loss. During admission at [facility], [name] [name] was stopped due to GI complaints with resolution.  At this time, it was  recommended to restart Plavix and patient is experiencing mild bruising but no bleeding.  Daughter is questioning how she can have loop recorder placed for over 3 years which did not show atrial fibrillation but this was found during hospital admission and questioning accuracy of this finding.  Pravastatin was also stopped due to variety as possible side effects during admission to [facility].  Patient was discharged on Cardizem after hospital admission but this was not continued once patient was discharged home from [facility].  It was also recommended for patient to be on Vimpat at discharge for seizure activity but patient was not discharged home on this after [facility] admission.  Daughter is unaware of any seizure-like activity that occurred during [facility] and denies any type of seizure activity since she has been home.  Blood pressure today satisfactory 140/72.  Denies new or worsening stroke/TIA symptoms.    ROS:   14 system review of systems performed and negative with exception of appetite change, abdominal pain, nausea, memory loss, speech difficulty, agitation, confusion, depression, and nervous/anxious  PMH:  Past Medical History:  Diagnosis Date  . Arthritis    "thumbs, joints" (03/23/2017  . Basal cell carcinoma    "several; scattered over my face, hands, leg some cut off; some burned off"  . Blind left eye    a. Corneal transplant x3 with rejection  . CAD (coronary artery disease)    a. s/p NSTEMI 2015 treated conservatively; nuclear stress test low risk. b. Continued angina despite med rx 07/2015 - s/p Bio Freedom Stent to ramus intermedius with mod LAD stenosis neg by FFR. // Nuc study 5/19:  EF 67, no ischemia or scar; Low Risk   . Complication of anesthesia    "very sensitive to RX"  . Diverticulitis large intestine   . Diverticulosis   . Family history of adverse reaction to anesthesia    "we all get PONV" (1/1/201)  . Gastritis   . GERD (gastroesophageal reflux disease)    . Hayfever   . Helicobacter pylori (H. pylori) 05/22/02   RUT-Positive  . Hypertension   . Myocardial infarction ([facility]) 2015  . Squamous carcinoma    "several; scattered over my face, hands, leg some cut off; some burned off"  . Stroke [facility]) 2015   denies residual on 08/07/2015  . Stroke [facility]) 03/18/2017   "has effected her vision, balance, some memory" (03/23/2017)  . SVT (supraventricular tachycardia) (HCC)    a. seen by Dr. Caryl Comes - has loop recorder in. Patient has declined amiodarone due to side effect profile  . Varicose veins     PSH:  Past Surgical History:  Procedure Laterality Date  . BASAL CELL CARCINOMA EXCISION     "several; scattered over my face, hands, leg some cut off; some burned off"  . CARDIAC CATHETERIZATION N/A 08/07/2015   Procedure: Left Heart Cath and Coronary Angiography;  Surgeon: [name], MD;  Location: [place] [facility];  Service: Cardiovascular;  Laterality: N/A;  . CARDIAC CATHETERIZATION N/A 08/07/2015   Procedure: Coronary Stent Intervention;  Surgeon: [name], MD;  Location: [place] [facility];  Service: Cardiovascular;  Laterality: N/A;  . CARDIAC CATHETERIZATION N/A 08/07/2015   Procedure: Intravascular Pressure Wire/FFR Study;  Surgeon: [name], MD;  Location: [place] [facility];  Service: Cardiovascular;  Laterality: N/A;  . CATARACT EXTRACTION W/ INTRAOCULAR LENS  IMPLANT, BILATERAL Bilateral 2005  . CLOSED REDUCTION SHOULDER DISLOCATION Left 2016 X 2  . CORNEAL TRANSPLANT Bilateral right 2009, left 2009    3 in left eye (last 2 failed), 1 in right eye  . DILATION AND CURETTAGE OF UTERUS    . EYE SURGERY    . JOINT REPLACEMENT    . [place]   "had gangrene in it"  . LOOP RECORDER IMPLANT N/A 01/05/2014   Procedure: LOOP RECORDER IMPLANT;  Surgeon: Coralyn Mark, MD;  Location: [place] CATH LAB;  Service: Cardiovascular;  Laterality: N/A;  . SQUAMOUS CELL CARCINOMA EXCISION     "several; scattered over  my face, hands, leg some cut off; some burned off"  . TONSILLECTOMY    . TOTAL ABDOMINAL HYSTERECTOMY  11/1983   "ovaries and all"  . TOTAL KNEE ARTHROPLASTY Left 06/27/2012   Procedure: LEFT TOTAL KNEE ARTHROPLASTY;  Surgeon: Gearlean Alf, MD;  Location: [facility];  Service: Orthopedics;  Laterality: Left;  . TOTAL KNEE ARTHROPLASTY Right 12/12/2012   Procedure: RIGHT TOTAL KNEE ARTHROPLASTY;  Surgeon: Gearlean Alf, MD;  Location: [facility];  Service: Orthopedics;  Laterality: Right;  . TUBAL LIGATION  1966    Social History:  Social History   Socioeconomic History  . Marital status: Widowed    Spouse name: Not on file  . Number of children: 3  . Years of education: Some college  . Highest education level: Not on file  Occupational History  . Occupation: Retired  Scientific laboratory technician  . Financial resource strain: Not on file  . Food insecurity:    Worry: Not on file    Inability: Not on file  . Transportation needs:    Medical: Not on file    Non-medical: Not on file  Tobacco Use  . Smoking status: Never Smoker  . Smokeless tobacco: Never Used  Substance and Sexual Activity  . Alcohol use: No  . Drug use: No  . Sexual activity: Never  Lifestyle  . Physical activity:    Days per week: Not on file    Minutes per session: Not on file  . Stress: Not on file  Relationships  . Social connections:    Talks on phone: Not on file    Gets together: Not on file    Attends religious service: Not on file    Active member of club or organization: Not on file    Attends meetings of clubs or organizations: Not on file    Relationship status: Not on file  . Intimate partner violence:    Fear of current or ex partner: Not on file    Emotionally abused: Not on file    Physically abused: Not on file    Forced sexual activity: Not on file  Other Topics Concern  . Not on file  Social History Narrative   Patient is right handed   Patient lives alone.   Patient drinks 1 cup of coffee daily.      Family History:  Family History  Problem Relation Age of Onset  . Stroke Mother   . Heart attack Father   . Heart attack Brother   . Cancer Brother   . Heart attack Brother   .  Heart attack Brother   . Heart attack Brother   . Parkinsonism Brother     Medications:   Current Outpatient Medications on File Prior to Visit  Medication Sig Dispense Refill  . artificial tears (LACRILUBE) OINT ophthalmic ointment Place 1 application into both eyes at bedtime.    . clopidogrel (PLAVIX) 75 MG tablet take 1 tablet by mouth once daily    . Magnesium 250 MG TABS Take 250 mg by mouth at bedtime.    . Melatonin 1 MG SUBL Place 1 mg under the tongue at bedtime.    . Multiple Vitamins-Minerals (PRESERVISION AREDS 2) CAPS Take 1 capsule by mouth 2 (two) times daily.     . nebivolol (BYSTOLIC) 2.5 MG tablet Take 1 tablet (2.5 mg total) by mouth 2 (two) times daily. 180 tablet 3  . nitroGLYCERIN (NITROSTAT) 0.4 MG SL tablet Place 1 tablet (0.4 mg total) under the tongue every 5 (five) minutes as needed for chest pain. 25 tablet 2  . pantoprazole (PROTONIX) 40 MG tablet Take 40 mg by mouth daily.     Vladimir Faster Glycol-Propyl Glycol (SYSTANE ULTRA) 0.4-0.3 % SOLN Apply 1 drop to eye See admin instructions. Twice daily as needed and at bedtime for irritation    . prednisoLONE acetate (PRED FORTE) 1 % ophthalmic suspension Place 1 drop into both eyes See admin instructions. 1 drop into both eyes at bedtime spaced 5 minutes apart from Systane gel    . PRESCRIPTION MEDICATION Place 1 drop into the right eye 4 (four) times daily. [place] [id number]   Laray Anger MD  09/13/17 Serum, Autologous 50% drop    . ranitidine (ZANTAC) 150 MG tablet Take 150 mg by mouth at bedtime.  1  . sertraline (ZOLOFT) 50 MG tablet TAKE 1/2 TABLET BY MOUTH FOR 4 DAYS,THEN 1 TABLET DAILY  1  . sucralfate (CARAFATE) 1 GM/10ML suspension Take 10 mLs (1 g total) by mouth 4 (four) times daily -  with meals and  at bedtime. 420 mL 0   No current facility-administered medications on file prior to visit.     Allergies:   Allergies  Allergen Reactions  . Contrast Media [Iodinated Diagnostic Agents] Anaphylaxis  . Fluorescein Anaphylaxis, Shortness Of Breath, Itching and Swelling  . Hydralazine Hcl Anaphylaxis, Swelling, Palpitations and Rash  . Sulfa Antibiotics Rash and Other (See Comments)    Other Reaction: red bumps  . Ultram [Tramadol] Hives  . Yellow Dye Anaphylaxis, Itching, Swelling and Other (See Comments)    Fluorescein (in eye drops)  . Buprenex [Buprenorphine Hcl] Palpitations and Other (See Comments)  . Keflex [Cephalexin] Diarrhea  . Lipitor [Atorvastatin] Other (See Comments)    Muscle weakness  . Restasis [Cyclosporine] Swelling    redness of face with slight swelling   . Augmentin [Amoxicillin-Pot Clavulanate] Nausea And Vomiting  . Levofloxacin Other (See Comments)    Reaction not recalled  . Morphine And Related Other (See Comments)    agitation   . Percocet [Oxycodone-Acetaminophen] Nausea And Vomiting  . Tape Other (See Comments)    Adhesive tape pulls off the skin Able to tolerate paper tape  . Zetia [Ezetimibe] Other (See Comments)    Myalgias     Physical Exam  Vitals:   11/02/17 1433  BP: 140/72  Pulse: 71  Weight: 166 lb 11.2 oz (75.6 kg)   Body mass index is 29.53 kg/m. No exam data present  General: well developed, well nourished, seated, agitated throughout appointment with paranoid  behaviors; difficult to redirect Head: head normocephalic and atraumatic.   Neck: supple with no carotid or supraclavicular bruits Cardiovascular: regular rate and rhythm, no murmurs Musculoskeletal: no deformity Skin:  no rash/petichiae Vascular:  Normal pulses all extremities  Neurologic Exam Mental Status: Awake and fully alert.  Unable to assess orientation level or residual expressive and/or receptive aphasia.  Agitated throughout appointment with paranoid  behaviors and unable to redirect. Cranial Nerves: Fundoscopic exam unable to perform due to uncooperation.  Pupils equal, briskly reactive to light. Extraocular movements and visual fields unable to accurately assess due to uncooperation patient. Hearing intact.  Blind left eye with cloudy cornea.  Facial sensation intact. Face, tongue, palate moves normally and symmetrically.  Motor: Normal bulk and tone. Normal strength in all tested extremity muscles. Sensory.: intact to touch , pinprick , position and vibratory sensation.  Coordination: Rapid alternating movements, Finger-to-nose and heel-to-shin unable to assess. Gait and Station: Arises from chair without difficulty. Stance is normal. Gait demonstrates normal stride length and balance .  Reflexes: 1+ and symmetric. Toes downgoing.    NIHSS  8 Modified Rankin  2 HAS-BLED 2 CHA2DS2-VASc 6   Diagnostic Data (Labs, Imaging, Testing)   CT head wo contrast 09/21/17 IMPRESSION: 1. No acute intracranial findings. 2. Interval development of a large area of encephalomalacia in the right occipital lobe consistent with a stroke sustained over the last 6 months. This has no definite acute components but could be further evaluated with MRI if clinically warranted.  Ct angio head w or wo contrast Ct angio neck w or wo contrast Ct cerebral perfusion w contrast 09/21/17 IMPRESSION: 1. Acute left M3 occlusion, likely embolic. Associated core infarct involving the left temporoccipital region with relatively small volume 11 mL surrounding ischemic penumbra. 2. Atheromatous change involving the intracranial circulation, most notable at the right M1 segment and bilateral PCAs. No hemodynamically significant or correctable stenosis identified. 3. Mild for age atherosclerotic change about the carotid bifurcations without high-grade stenosis. 4. Diffuse tortuosity of the major arterial vasculature of the neck, suggesting chronic underlying  hypertension.  MRI brain Not performed due to incorporation  of patient - would not change intervention  2D echocardiogram - Left ventricle: The cavity size was normal. Wall thickness wasnormal. Systolic function was normal. The estimated ejectionfraction was in the range of 60% to 65%. Wall motion was normal;there were no regional wall motion abnormalities. Features areconsistent with a pseudonormal left ventricular filling pattern,with concomitant abnormal relaxation and increased fillingpressure (grade 2 diastolic dysfunction). - Aortic valve: Mildly calcified annulus. Trileaflet; mildlycalcified leaflets. There was mild regurgitation. - Mitral valve: There was trivial regurgitation. - Left atrium: The atrium was at the upper limits of normal insize. - Right atrium: Central venous pressure (est): 3 mm Hg. - Atrial septum: No defect or patent foramen ovale was identified. - Tricuspid valve: There was mild regurgitation. - Pulmonary arteries: PA peak pressure: 30 mm Hg (S). - Pericardium, extracardiac: There was no pericardial effusion.  EEG  Impression: This EEG is abnormal due tothe presence of: 1. Moderatediffuse backgroundslowing 2. Occasional focal slowing over the left temporal region Clinical Correlation of the above findings indicates diffuse cerebral dysfunction that is non-specific in etiology and can be seen with hypoxic/ischemic injury, toxic/metabolic encephalopathies, neurodegenerative disorders, or medication effect.Focal slowing over the left temporal region indicates focal cerebral dysfunction in this region suggestive of underlying structural or physiologic abnormality.The absence of epileptiform discharges does not rule out a clinical diagnosis of epilepsy. Clinical correlation is advised.  ASSESSMENT: [name] [name] is a 84 y.o. year old female here with left temporal infarct on 09/21/2017 secondary to new onset atrial fibrillation not on AC. Vascular  risk factors include HTN, HLD and new onset AF.     PLAN: -Continue clopidogrel 75 mg daily for secondary stroke prevention -after speaking with Dr. Leonie Man, recommend to start depakote 125mg  twice a day for 7 days and then increase to 250mg  twice a day for behavioral issues - may consider EEG in future -follow up with cardiologist in regards to [facility] and AF management -F/u with PCP regarding your HLD and HTN management -continue to monitor BP at home -Maintain strict control of hypertension with blood pressure goal below 130/90, diabetes with hemoglobin A1c goal below 6.5% and cholesterol with LDL cholesterol (bad cholesterol) goal below 70 mg/dL. I also advised the patient to eat a healthy diet with plenty of whole grains, cereals, fruits and vegetables, exercise regularly and maintain ideal body weight.  Follow up in 3 months or call earlier if needed   Greater than 50% of time during this 25 minute visit was spent on counseling,explanation of diagnosis of left temporal infarct, reviewing risk factor management of AF, HTN, and HLD, planning of further management, discussion with patient and family and coordination of care    Venancio Poisson, [facility]  [facility] [address] [place], [place] [address]  Phone [phone] Fax [phone]

## 2017-11-02 NOTE — Patient Instructions (Signed)
Continue clopidogrel 75 mg daily for secondary stroke prevention  Continue to follow up with PCP regarding cholesterol and blood pressure management   Continue to follow up with cardiologist as scheduled   Continue to monitor blood pressure at home  Maintain strict control of hypertension with blood pressure goal below 130/90, diabetes with hemoglobin A1c goal below 6.5% and cholesterol with LDL cholesterol (bad cholesterol) goal below 70 mg/dL. I also advised the patient to eat a healthy diet with plenty of whole grains, cereals, fruits and vegetables, exercise regularly and maintain ideal body weight.  Followup in the future with me in 3 months or call earlier if needed       Thank you for coming to see [place] at [facility]. I hope we have been able to provide you high quality care today.  You may receive a patient satisfaction survey over the next few weeks. We would appreciate your feedback and comments so that we may continue to improve ourselves and the health of our patients.

## 2017-11-03 ENCOUNTER — Telehealth: Payer: Self-pay

## 2017-11-03 NOTE — Telephone Encounter (Signed)
[name] Poisson, NP  Marval Regal, RN        Please call patients daughter letting her know that we will start on Depakote 125mg  twice a day for 7 days. If tolerating well, please advise to call office and we will increase dose to 250mg  twice a day. This will help with her behavioral issues. Thank you.

## 2017-11-03 NOTE — Telephone Encounter (Signed)
Revised. 

## 2017-11-03 NOTE — Telephone Encounter (Addendum)
Rn call patients daughter [name] about [name] Billow NP and Dr Leonie Man recommend about depakote for her behavior issues after reviewing the chart after last visit. RN stated pt will start with 125mg  capsule twice a day for 7 days. If patient is tolerating increase to 250mg  bid. Rn stated to daughter if pt will not swallow pills sometimes she can ask the pharmacy can the capsule sprinkles be in apple sauce, protein shake, ice cream, or food. The daughter ask can the pt take this with the zoloft that was prescribed by pts PCP. RN stated per [name] Billow NP it can be use together because the depakote is for behavior issues. RN stated to daughter to give the medication at least two weeks to show if its effective. She wanted to know if it would make her mom like she use to be. Rn stated there is no guarantee it would change her personality and behavior back how she was before. Rn stated it may have decrease some of the behavior antics,and issues she is having now. RN remind the daughter that the medication may or may not give her side effects. Rn stated it can cause pt to be tired, sleepy a lot, dizzy,and drowsiness issues. RN also stress that it affects patients differently. The daughter wanted to look the name of the medication so she can tell her other siblings. RN stated when she looks it it,another usage for the medication is seizures. Rn stated the medication can be use for headache, seizures,and behavior issues. RN stated the rx was already sent to the pharmacy. RN recommend daughter call back in two weeks to see the effect of the medication. The daughter appreciate the call, and verbalized understand. She will call back for any concerns.

## 2017-11-04 NOTE — Telephone Encounter (Signed)
Pts daughter [name] requesting a call stating she is wanting to discuss the pt going on medication Risterdal?(Spelling per sandra) due to pts stomach issues with prior medications. Please call to advise

## 2017-11-04 NOTE — Telephone Encounter (Signed)
RN spoke with [name] about mom maybe could try restoril. The daughter stated her other siblings spoke with a family friend whos mom was on the medication. RN stated Janett Billow NP and Dr.SEthi want to to start depakote asap for behavior management.RN ask if patient started the medication today. [name] stated they have not pick up the medication yet. Her siblings were concern that the depakote may cause her stomach issues. Rn stated if pt has not started the depakote we dont know what side effects she will have.RN stated all medications have side effects. RN stated if pt has a weak stomach when taking meds, she may need to take it with food or a snack. The daughter stated she was just asking because we are the experts,and they just had concerns. The daughter once again ask about the dosage, and how long she will be on it. RN stated 125mg  bid for 7 days, if pt tolerates increase to 250mg  in the am,and 250mg  in the pm. RN stated to give the medication at least two weeks to get in her system. Rn advise daughter to call us anytime if she has concerns. She verbalized understanding.

## 2017-11-04 NOTE — Progress Notes (Signed)
I agree with the above plan 

## 2017-11-08 DIAGNOSIS — H540X33 Blindness right eye category 3, blindness left eye category 3: Secondary | ICD-10-CM | POA: Diagnosis not present

## 2017-11-08 DIAGNOSIS — R1312 Dysphagia, oropharyngeal phase: Secondary | ICD-10-CM | POA: Diagnosis not present

## 2017-11-08 DIAGNOSIS — F419 Anxiety disorder, unspecified: Secondary | ICD-10-CM | POA: Diagnosis not present

## 2017-11-08 DIAGNOSIS — I69320 Aphasia following cerebral infarction: Secondary | ICD-10-CM | POA: Diagnosis not present

## 2017-11-08 DIAGNOSIS — I69398 Other sequelae of cerebral infarction: Secondary | ICD-10-CM | POA: Diagnosis not present

## 2017-11-08 DIAGNOSIS — I252 Old myocardial infarction: Secondary | ICD-10-CM | POA: Diagnosis not present

## 2017-11-08 DIAGNOSIS — K219 Gastro-esophageal reflux disease without esophagitis: Secondary | ICD-10-CM | POA: Diagnosis not present

## 2017-11-08 DIAGNOSIS — I480 Paroxysmal atrial fibrillation: Secondary | ICD-10-CM | POA: Diagnosis not present

## 2017-11-08 DIAGNOSIS — G40309 Generalized idiopathic epilepsy and epileptic syndromes, not intractable, without status epilepticus: Secondary | ICD-10-CM | POA: Diagnosis not present

## 2017-11-08 DIAGNOSIS — I10 Essential (primary) hypertension: Secondary | ICD-10-CM | POA: Diagnosis not present

## 2017-11-08 DIAGNOSIS — I2510 Atherosclerotic heart disease of native coronary artery without angina pectoris: Secondary | ICD-10-CM | POA: Diagnosis not present

## 2017-11-08 DIAGNOSIS — I69391 Dysphagia following cerebral infarction: Secondary | ICD-10-CM | POA: Diagnosis not present

## 2017-11-08 DIAGNOSIS — M1991 Primary osteoarthritis, unspecified site: Secondary | ICD-10-CM | POA: Diagnosis not present

## 2017-11-10 DIAGNOSIS — N390 Urinary tract infection, site not specified: Secondary | ICD-10-CM | POA: Diagnosis not present

## 2017-11-10 DIAGNOSIS — K219 Gastro-esophageal reflux disease without esophagitis: Secondary | ICD-10-CM | POA: Diagnosis not present

## 2017-11-10 DIAGNOSIS — F411 Generalized anxiety disorder: Secondary | ICD-10-CM | POA: Diagnosis not present

## 2017-11-10 DIAGNOSIS — I6930 Unspecified sequelae of cerebral infarction: Secondary | ICD-10-CM | POA: Diagnosis not present

## 2017-11-10 DIAGNOSIS — I10 Essential (primary) hypertension: Secondary | ICD-10-CM | POA: Diagnosis not present

## 2017-11-18 ENCOUNTER — Encounter: Payer: Self-pay | Admitting: Gastroenterology

## 2017-11-18 ENCOUNTER — Other Ambulatory Visit (INDEPENDENT_AMBULATORY_CARE_PROVIDER_SITE_OTHER): Payer: PPO

## 2017-11-18 ENCOUNTER — Encounter

## 2017-11-18 ENCOUNTER — Ambulatory Visit: Payer: PPO | Admitting: Gastroenterology

## 2017-11-18 VITALS — BP 132/68 | HR 69 | Ht 62.5 in | Wt 170.0 lb

## 2017-11-18 DIAGNOSIS — R142 Eructation: Secondary | ICD-10-CM | POA: Diagnosis not present

## 2017-11-18 DIAGNOSIS — R1013 Epigastric pain: Secondary | ICD-10-CM | POA: Diagnosis not present

## 2017-11-18 LAB — BASIC METABOLIC PANEL
BUN: 20 mg/dL (ref 6–23)
CO2: 32 mEq/L (ref 19–32)
Calcium: 9.2 mg/dL (ref 8.4–10.5)
Chloride: 100 mEq/L (ref 96–112)
Creatinine, Ser: 0.74 mg/dL (ref 0.40–1.20)
GFR: 78.22 mL/min (ref >60.00)
Glucose, Bld: 106 mg/dL — ABNORMAL HIGH (ref 70–99)
POTASSIUM: 3.7 meq/L (ref 3.5–5.1)
SODIUM: 137 meq/L (ref 135–145)

## 2017-11-18 MED ORDER — RANITIDINE HCL 300 MG PO TABS: 300.0000 mg | ORAL_TABLET | Freq: Every day | ORAL | 5 refills | Status: DC

## 2017-11-18 MED ORDER — PANTOPRAZOLE SODIUM 40 MG PO TBEC: 40.0000 mg | DELAYED_RELEASE_TABLET | Freq: Two times a day (BID) | ORAL | 3 refills | Status: DC

## 2017-11-18 MED ORDER — RANITIDINE HCL 300 MG PO TABS: 300.0000 mg | ORAL_TABLET | Freq: Two times a day (BID) | ORAL | 5 refills | Status: DC

## 2017-11-18 NOTE — Progress Notes (Signed)
11/18/2017 [name] [name] [id number] 01/12/1925   HISTORY OF PRESENT ILLNESS: This is a 84 year old female who is a patient of Dr. Doyne Keel.  She is known here for issues with recurrent diverticulitis over the past few years.  Anyway, she is here today with her daughter and granddaughter.  Apparently she had a stroke in July and now has worsened vision and expressive aphasia.  They said that since her stroke or shortly after she has been complaining of a lot of upper abdominal pain, grabbing her stomach in that area constantly and even moaning and yelling out at times.  Also has been belching a lot.  She is on pantoprazole 40 mg daily.  They also have Carafate suspension prescribed for 4 times daily but are only apparently using that on occasion and she has Zantac as needed as well.  She was prescribed Eliquis after her stroke and they thought that initially that was causing her pain so they begged her physicians to change it.  She is now on Plavix and the pain has continued.  She is belching today here in the office and gripping her epigastrium.   Past Medical History:  Diagnosis Date  . Arthritis    "thumbs, joints" (03/23/2017  . Basal cell carcinoma    "several; scattered over my face, hands, leg some cut off; some burned off"  . Blind left eye    a. Corneal transplant x3 with rejection  . CAD (coronary artery disease)    a. s/p NSTEMI 2015 treated conservatively; nuclear stress test low risk. b. Continued angina despite med rx 07/2015 - s/p Bio Freedom Stent to ramus intermedius with mod LAD stenosis neg by FFR. // Nuc study 5/19:  EF 67, no ischemia or scar; Low Risk   . Complication of anesthesia    "very sensitive to RX"  . Diverticulitis large intestine   . Diverticulosis   . Family history of adverse reaction to anesthesia    "we all get PONV" (1/1/201)  . Gastritis   . GERD (gastroesophageal reflux disease)   . Hayfever   . Helicobacter pylori (H. pylori) 05/22/02   RUT-Positive  . Hypertension   . Myocardial infarction ([place]) 2015  . Squamous carcinoma    "several; scattered over my face, hands, leg some cut off; some burned off"  . Stroke [facility]) 2015   denies residual on 08/07/2015  . Stroke [facility]) 03/18/2017   "has effected her vision, balance, some memory" (03/23/2017)  . SVT (supraventricular tachycardia) ([facility])    a. seen by Dr. Caryl Comes - has loop recorder in. Patient has declined amiodarone due to side effect profile  . Varicose veins    Past Surgical History:  Procedure Laterality Date  . BASAL CELL CARCINOMA EXCISION     "several; scattered over my face, hands, leg some cut off; some burned off"  . CARDIAC CATHETERIZATION N/A 08/07/2015   Procedure: Left Heart Cath and Coronary Angiography;  Surgeon: [name], MD;  Location: [place] CV LAB;  Service: Cardiovascular;  Laterality: N/A;  . CARDIAC CATHETERIZATION N/A 08/07/2015   Procedure: Coronary Stent Intervention;  Surgeon: [name], MD;  Location: [place] CV LAB;  Service: Cardiovascular;  Laterality: N/A;  . CARDIAC CATHETERIZATION N/A 08/07/2015   Procedure: Intravascular Pressure Wire/FFR Study;  Surgeon: [name], MD;  Location: [place] CV LAB;  Service: Cardiovascular;  Laterality: N/A;  . CATARACT EXTRACTION W/ INTRAOCULAR LENS  IMPLANT, BILATERAL Bilateral 2005  . CLOSED REDUCTION SHOULDER DISLOCATION Left 2016  X 2  . CORNEAL TRANSPLANT Bilateral right 2009, left 2009    3 in left eye (last 2 failed), 1 in right eye  . DILATION AND CURETTAGE OF UTERUS    . EYE SURGERY    . JOINT REPLACEMENT    . [place]   "had gangrene in it"  . LOOP RECORDER IMPLANT N/A 01/05/2014   Procedure: LOOP RECORDER IMPLANT;  Surgeon: Coralyn Mark, MD;  Location: [place] CATH LAB;  Service: Cardiovascular;  Laterality: N/A;  . SQUAMOUS CELL CARCINOMA EXCISION     "several; scattered over my face, hands, leg some cut off; some burned off"  . TONSILLECTOMY      . TOTAL ABDOMINAL HYSTERECTOMY  11/1983   "ovaries and all"  . TOTAL KNEE ARTHROPLASTY Left 06/27/2012   Procedure: LEFT TOTAL KNEE ARTHROPLASTY;  Surgeon: Gearlean Alf, MD;  Location: [facility];  Service: Orthopedics;  Laterality: Left;  . TOTAL KNEE ARTHROPLASTY Right 12/12/2012   Procedure: RIGHT TOTAL KNEE ARTHROPLASTY;  Surgeon: Gearlean Alf, MD;  Location: [facility];  Service: Orthopedics;  Laterality: Right;  . TUBAL LIGATION  1966    reports that she has never smoked. She has never used smokeless tobacco. She reports that she does not drink alcohol or use drugs. family history includes Cancer in her brother; Heart attack in her brother, brother, brother, brother, and father; Parkinsonism in her brother; Stroke in her mother. Allergies  Allergen Reactions  . Contrast Media [Iodinated Diagnostic Agents] Anaphylaxis  . Fluorescein Anaphylaxis, Shortness Of Breath, Itching and Swelling  . Hydralazine Hcl Anaphylaxis, Swelling, Palpitations and Rash  . Sulfa Antibiotics Rash and Other (See Comments)    Other Reaction: red bumps  . Ultram [Tramadol] Hives  . Yellow Dye Anaphylaxis, Itching, Swelling and Other (See Comments)    Fluorescein (in eye drops)  . Buprenex [Buprenorphine Hcl] Palpitations and Other (See Comments)  . Keflex [Cephalexin] Diarrhea  . Lipitor [Atorvastatin] Other (See Comments)    Muscle weakness  . Restasis [Cyclosporine] Swelling    redness of face with slight swelling   . Augmentin [Amoxicillin-Pot Clavulanate] Nausea And Vomiting  . Levofloxacin Other (See Comments)    Reaction not recalled  . Morphine And Related Other (See Comments)    agitation   . Percocet [Oxycodone-Acetaminophen] Nausea And Vomiting  . Tape Other (See Comments)    Adhesive tape pulls off the skin Able to tolerate paper tape  . Zetia [Ezetimibe] Other (See Comments)    Myalgias      Outpatient Encounter Medications as of 11/18/2017  Medication Sig  . clopidogrel (PLAVIX) 75 MG  tablet take 1 tablet by mouth once daily  . Multiple Vitamins-Minerals (PRESERVISION AREDS 2) CAPS Take 1 capsule by mouth 2 (two) times daily.   . nebivolol (BYSTOLIC) 2.5 MG tablet Take 1 tablet (2.5 mg total) by mouth 2 (two) times daily.  . nitroGLYCERIN (NITROSTAT) 0.4 MG SL tablet Place 1 tablet (0.4 mg total) under the tongue every 5 (five) minutes as needed for chest pain.  . prednisoLONE acetate (PRED FORTE) 1 % ophthalmic suspension Place 1 drop into both eyes See admin instructions. 1 drop into both eyes at bedtime spaced 5 minutes apart from Systane gel  . ranitidine (ZANTAC) 75 MG tablet Take 75 mg by mouth as needed for heartburn (1/2 tablet as needed).  . sertraline (ZOLOFT) 50 MG tablet TAKE 1/2 TABLET BY MOUTH FOR 4 DAYS,THEN 1 TABLET DAILY  . sucralfate (CARAFATE) 1 GM/10ML suspension  Take 10 mLs (1 g total) by mouth 4 (four) times daily -  with meals and at bedtime.  . TURMERIC PO Take by mouth 2 (two) times daily at 10 AM and 5 PM.  . [DISCONTINUED] artificial tears (LACRILUBE) OINT ophthalmic ointment Place 1 application into both eyes at bedtime.  . [DISCONTINUED] divalproex (DEPAKOTE SPRINKLES) 125 MG capsule Take 1 capsule (125 mg total) by mouth 2 (two) times daily.  . [DISCONTINUED] Magnesium 250 MG TABS Take 250 mg by mouth at bedtime.  . [DISCONTINUED] Melatonin 1 MG SUBL Place 1 mg under the tongue at bedtime.  . [DISCONTINUED] pantoprazole (PROTONIX) 40 MG tablet Take 40 mg by mouth daily.   . [DISCONTINUED] Polyethyl Glycol-Propyl Glycol (SYSTANE ULTRA) 0.4-0.3 % SOLN Apply 1 drop to eye See admin instructions. Twice daily as needed and at bedtime for irritation  . [DISCONTINUED] PRESCRIPTION MEDICATION Place 1 drop into the right eye 4 (four) times daily. [place] [id number]   Laray Anger MD  09/13/17 Serum, Autologous 50% drop  . [DISCONTINUED] ranitidine (ZANTAC) 150 MG tablet Take 150 mg by mouth at bedtime.   No facility-administered encounter  medications on file as of 11/18/2017.      REVIEW OF SYSTEMS  : All other systems reviewed and negative except where noted in the History of Present Illness.   PHYSICAL EXAM: BP 132/68   Pulse 69   Ht 5' 2.5" (1.588 m)   Wt 170 lb (77.1 kg)   BMI 30.60 kg/m  General: Well developed white female in no acute distress Head: Normocephalic and atraumatic Eyes:  Sclerae anicteric, conjunctiva pink. Ears: Normal auditory acuity Lungs: Clear throughout to auscultation; no increased WOB. Heart: Regular rate and rhythm; no M/R/G. Abdomen: Soft, non-distended.  BS present.  Mild to moderate epigastric TTP. Musculoskeletal: Symmetrical with no gross deformities  Skin: No lesions on visible extremities Extremities: No edema  Neurological: Expressive aphasia noted.  ASSESSMENT AND PLAN: *Epigastric abdominal pain and belching: Patient had a recent stroke and now has expressive aphasia.  Complaining of epigastric abdominal pain constantly and moaning with a lot of belching per family.  Question reflux and reflux related issues versus ulcer, etc.  We will treat her empirically for those issues.  She is on pantoprazole 40 mg daily and I am going to increase that to twice a day.  We will also add Zantac 300 mg daily at bedtime.  She also has Carafate suspension 4 times daily, which they have only been using periodically.  I have recommended using that regularly for now.  We will check a CT scan of the abdomen with contrast.   CC:  Darcus Austin, MD

## 2017-11-18 NOTE — Progress Notes (Signed)
Agree with assessment and plan as outlined. She is higher than average risk for endoscopy in light of recent events, agree with maximizing PPI right now and agree with cross sectional imaging of the abdomen / pelvis with CT to further evaluate. If she can't wait to have this done as outpatient and symptoms progress, she will need to go to the ED for imaging.

## 2017-11-18 NOTE — Patient Instructions (Addendum)
Take Carafate four times a day as prescribed.   Take Zantac 300 mg at bedtime.   Take Pantoprazole 40 mg twice a day before breakfast and dinner.    Your provider has requested that you go to the basement level for lab work before leaving today. Press "B" on the elevator. The lab is located at the first door on the left as you exit the elevator.  You have been scheduled for a CT scan of the abdomen and pelvis at [place] are scheduled on 11-26-17 at 3:00 pm. You should arrive 15 minutes prior to your appointment time for registration. Please follow the written instructions below on the day of your exam:  WARNING: IF YOU ARE ALLERGIC TO IODINE/X-RAY DYE, PLEASE NOTIFY RADIOLOGY IMMEDIATELY AT [phone]! YOU WILL BE GIVEN A 13 HOUR PREMEDICATION PREP.  1) Do not eat or drink anything after 11 am (4 hours prior to your test) 2) You have been given 2 bottles of oral contrast to drink. The solution may taste better if refrigerated, but do NOT add ice or any other liquid to this solution. Shake well before drinking.    Drink 1 bottle of contrast @ 1:00 pm (2 hours prior to your exam)  Drink 1 bottle of contrast @ 2:00 pm (1 hour prior to your exam)  You may take any medications as prescribed with a small amount of water except for the following: Metformin, Glucophage, Glucovance, Avandamet, Riomet, Fortamet, Actoplus Met, Janumet, Glumetza or Metaglip. The above medications must be held the day of the exam AND 48 hours after the exam.  The purpose of you drinking the oral contrast is to aid in the visualization of your intestinal tract. The contrast solution may cause some diarrhea. Before your exam is started, you will be given a small amount of fluid to drink. Depending on your individual set of symptoms, you may also receive an intravenous injection of x-ray contrast/dye. Plan on being at [facility] for 30 minutes or longer, depending on the type of exam you are  having performed.  This test typically takes 30-45 minutes to complete.  If you have any questions regarding your exam or if you need to reschedule, you may call the CT department at [phone] between the hours of 8:00 am and 5:00 pm, Monday-Friday.  ________________________________________________________________________

## 2017-11-20 ENCOUNTER — Other Ambulatory Visit: Payer: Self-pay | Admitting: Adult Health

## 2017-11-23 ENCOUNTER — Telehealth: Payer: Self-pay

## 2017-11-23 ENCOUNTER — Other Ambulatory Visit: Payer: Self-pay | Admitting: Adult Health

## 2017-11-23 ENCOUNTER — Telehealth: Payer: Self-pay | Admitting: Gastroenterology

## 2017-11-23 MED ORDER — DIVALPROEX SODIUM ER 250 MG PO TB24: 250.0000 mg | ORAL_TABLET | Freq: Every day | ORAL | 4 refills | Status: DC

## 2017-11-23 MED ORDER — DIVALPROEX SODIUM ER 250 MG PO TB24: 250.0000 mg | ORAL_TABLET | Freq: Two times a day (BID) | ORAL | 4 refills | Status: DC

## 2017-11-23 NOTE — Addendum Note (Signed)
Addended by: Venancio Poisson on: 11/23/2017 03:20 PM   Modules accepted: Orders

## 2017-11-23 NOTE — Telephone Encounter (Signed)
RN call patients daughter [name] about needing refill on depakote. Rn ask how was pt doing on depakote.The daughter stated the pt is sleeping a lot. [name] stated pts PCP prescribed xanax for agitation, and behavior ir needed. [name] stated her mom is still taking the depakote 125 mg bid, she has not increase to 250mg  bid. The daughter stated pt just started taking the depakote on 11/19/2017. [name] explain pt saw her PCP, and GI doctor. She wanted to make sure the pt could tolerate it with all of her GI symptoms. Pts daughter was put on hold while Rn consulted with Janett Billow NP.  Rn consulted with Janett Billow NP about pt was prescribed depkote on 11/02/2017 and just started her first dose on 11/19/2017.RN also told JEssica NP, the pts PCP prescribed xanax  prn If needed. Per Janett Billow NP she recommend if the pt does not take xanax until she is at the dosage  of 250mg  twice a day of depakote.  Janett Billow NP wants to see how well the depakote will help the patients behavior. Janett Billow NP recommend for daughter to only give the xanax if its highly needed.Rn reported the daughter stated her mom was sleeping a lot. She has notice a difference in her behavior since being on depakote. Rn spoke with daughter and gave her Janett Billow NP recommendations about the xanax prescribed by the PCP, and to continue titration of the depakote of 250mg  bid. The daughter needed a refill for the 250mg  after Friday. The daughter will call us back if pt is not tolerating the 250mg  bid dosage. She verbalized understanding of Jessica NP recommendations.

## 2017-11-23 NOTE — Telephone Encounter (Signed)
Rn call [name] [name] back that pt rx of depakote was sent to her pharmacy. Rn stated pt will just take one pill of 250mg  in the am, and 250mg  in the pm to make total of 500mg . The daughter verbalized understanding.

## 2017-11-24 NOTE — Telephone Encounter (Signed)
Left message for patient's daughter to call office. 

## 2017-11-24 NOTE — Telephone Encounter (Signed)
Spoke to patients daughter she denies patient is allergic to contrast, she states she is only allergic to yellow dye. Radiology still would like to know if your would like to 13 hour pre-medicate her? Please advise.

## 2017-11-25 ENCOUNTER — Ambulatory Visit (HOSPITAL_COMMUNITY): Admission: RE | Admit: 2017-11-25 | Payer: PPO | Source: Ambulatory Visit

## 2017-11-25 MED ORDER — QUETIAPINE FUMARATE 25 MG PO TABS: 25.0000 mg | ORAL_TABLET | Freq: Every day | ORAL | 3 refills | Status: DC

## 2017-11-25 NOTE — Telephone Encounter (Signed)
I guess it would not hurt to pre-medicate her just in case.

## 2017-11-25 NOTE — Telephone Encounter (Signed)
Rn call patients daughter [name] [name] about her moms behavior is getting worse. [name] [name] stated pt did not want to go back in the house yesterday.She did not recognize her own son. She is hitting,and the screaming at the sitter,and her other daughter. [name] [name] stated she is frustrated because her mom was normal prior to the stroke. Rn stated Janett Billow Np made some recommendations to her medications. Rn requested [name] [name] write down all the medication recommendations, and once Janett Billow NP state pt should stop:  1. Rn stated to daughter stop the Xanax and temazepam because benzodiazepines could make symptoms of dementia behaviors worse. The daughter verbalized understanding.  2. Continue the depakote of 125 titration till Friday twice a day. Pick up the 250mg  of depakote twice a day, start Saturday. The daughter verbalized understanding.  3. Start seroquel 25mg  at night time until the full dose of depakote is able to achieve. COntinue seroquel and depakote for a week to get the level achieve. Janett Billow NP may consider seroquel on as needed basis. Also continue long term care if safety concerns are a issue. The daughter verbalized understanding,and is very frustrated. The daughter will pick up all the medications today because of pending weather. RN transferred the call to [facility] NP for further information.

## 2017-11-25 NOTE — Telephone Encounter (Signed)
Pts daughter [name] requesting a call, stating that last night and this morning the pt has "been out of control" screaming at the sitter and her daughter wanting to go home. [name] requesting a call if she should increase pts Depakote or prescribed a second medication. Please call to advise

## 2017-11-25 NOTE — Telephone Encounter (Signed)
Spoke to daughter, [name], regarding continued concerns of her mother's dementia and ongoing behaviors.  Daughter is in tears due to continued frustrations and feeling of defeat as she is unsure how to further handle these behaviors.  She states that her mother is manageable up until around 2 or 3 PM in the afternoon.  She will start to become slightly more aggressive and the daughter will then give her a dose of Xanax which was prescribed by PCP.  She feels as though this has been helping her stating "she is back to her normal, sweet self" approximately 1 hour after taking.  A couple hours later though, patient starts to have increased aggression that involves screaming, hitting and being undirectable.  Daughter also stated that when she tries to not give her mother's Xanax, she will still become very agitated and aggressive.  Advised daughter to obtain Seroquel 25 mg today so this can be started tonight and to try to avoid administering Xanax.  Daughter was also advised that if these behaviors continue, she may need to call EMS so that her mother can be brought to the hospital where she can be safe and can be given the appropriate medications needed in order to calm her down while being monitored.  Daughter becomes tearful due to not wanting to have to bring her mother to the hospital as she feels as though this is not what her mother would want.  Advised the daughter that by calling EMS for additional support is to help keep her and her mother both safe.  Also spoke to daughter regarding possible need for higher level of care.  Explained to her that the hopes are when she gets to Depakote 250 mg twice daily, she will start coming down where she may only need Seroquel as needed but at this time to continue to give Seroquel daily along with continuing titration of Depakote.  Daughter verbalized understanding and appreciated call.  She was advised to call with any further questions or concerns.

## 2017-11-25 NOTE — Telephone Encounter (Addendum)
Please advise daughter to continue depakote at current titration schedule. In the mean time please advise to stop Xanax and temazepam as these benzodiazepines  could make symptoms of dementia behaviors worse. I will prescribe Seroquel 25mg  nightly until full dose of Depakote is able to be achieved (recent QTc 463 on 10/18/17). Please continue Seroquel until 1 week after full dose depakote level achieved. We can consider using seroquel on an as needed basis after that time. If behaviors continue, daughter may need to consider higher level of care in the interm versus long term due to safety concerns.

## 2017-11-25 NOTE — Telephone Encounter (Signed)
Left message on patients voicemail to call office.   

## 2017-11-25 NOTE — Addendum Note (Signed)
Addended by: Venancio Poisson on: 11/25/2017 10:29 AM   Modules accepted: Orders

## 2017-11-25 NOTE — Telephone Encounter (Signed)
[name],[name]((daughter on DPR) has called asking for a call back from [place] re: update on pt's medication

## 2017-11-29 ENCOUNTER — Telehealth: Payer: Self-pay | Admitting: Cardiovascular Disease

## 2017-11-29 NOTE — Telephone Encounter (Signed)
Agree. thanks

## 2017-11-29 NOTE — Telephone Encounter (Signed)
° °  Pt c/o BP issue: STAT if pt c/o blurred vision, one-sided weakness or slurred speech  1. What are your last 5 BP readings? 204/91 HR 75  2. Are you having any other symptoms (ex. Dizziness, headache, blurred vision, passed out)? NO  3. What is your BP issue? Daughter wants to know if patient should take Amlodipine

## 2017-11-29 NOTE — Telephone Encounter (Signed)
Spoke to patient's daughter (DPR) who called to inform us that the patient's BP was 204/91 HR 75.  She is asymptomatic and has a f/u appt 9/11.    In the time being, I informed daughter to have patient take an extra Bystolic 2.5 mg now 5pm and then her evening dose later.  I also told her to call us in the am with an update.  She verbalized understanding.

## 2017-12-01 ENCOUNTER — Encounter: Payer: Self-pay | Admitting: Cardiovascular Disease

## 2017-12-01 ENCOUNTER — Ambulatory Visit: Payer: PPO | Admitting: Cardiovascular Disease

## 2017-12-01 VITALS — BP 132/72 | HR 70 | Ht 62.5 in | Wt 168.2 lb

## 2017-12-01 DIAGNOSIS — I10 Essential (primary) hypertension: Secondary | ICD-10-CM

## 2017-12-01 DIAGNOSIS — I2510 Atherosclerotic heart disease of native coronary artery without angina pectoris: Secondary | ICD-10-CM | POA: Diagnosis not present

## 2017-12-01 MED ORDER — AMLODIPINE BESYLATE 5 MG PO TABS: 5.0000 mg | ORAL_TABLET | Freq: Every day | ORAL | 3 refills | Status: DC

## 2017-12-01 NOTE — Patient Instructions (Addendum)
Medication Instructions:  1) START AMLODIPINE 5 mg daily  Labwork: None  Testing/Procedures: None  Follow-Up: Your provider recommends that you schedule a follow-up appointment AS NEEDED with Dr. Burt Knack.

## 2017-12-01 NOTE — Progress Notes (Signed)
Cardiology Office Note:    Date:  12/01/2017   ID:  [name] [name], DOB 1925-01-06, MRN [id number]  PCP:  Darcus Austin, MD  Cardiologist:  [name], MD  Electrophysiologist:  None   Referring MD: Darcus Austin, MD   Chief Complaint  Patient presents with  . Follow-up    stroke   History of Present Illness:    [name] [name] is a 84 y.o. female with a hx of coronary artery disease, labile hypertension, and SVT.  She is been followed for many years.  Unfortunately she suffered a large stroke in July 2019, presenting with confusion, visual disturbances, and sensory aphasia.  During her hospitalization she had tonic-clonic seizures and atrial fibrillation with RVR.  She had hospital delirium and a fairly prolonged hospitalization.  She is here today with her daughter.  Her daughter is now living with her and taking care of her 80 hours a week.  There is a caregiver at nighttime.  The patient does not recognize most of her family anymore.  She is agitated and angry much of the time.  She is really not able to communicate at all today.  History is obtained from the daughter.  She is checked her blood pressure at times and it is always elevated, sometimes with systolic readings greater than 200 mmHg.  Past Medical History:  Diagnosis Date  . Arthritis    "thumbs, joints" (03/23/2017  . Basal cell carcinoma    "several; scattered over my face, hands, leg some cut off; some burned off"  . Blind left eye    a. Corneal transplant x3 with rejection  . CAD (coronary artery disease)    a. s/p NSTEMI 2015 treated conservatively; nuclear stress test low risk. b. Continued angina despite med rx 07/2015 - s/p Bio Freedom Stent to ramus intermedius with mod LAD stenosis neg by FFR. // Nuc study 5/19:  EF 67, no ischemia or scar; Low Risk   . Complication of anesthesia    "very sensitive to RX"  . Diverticulitis large intestine   . Diverticulosis   . Family history of adverse reaction to anesthesia    "we all get PONV" (1/1/201)  . Gastritis   . GERD (gastroesophageal reflux disease)   . Hayfever   . Helicobacter pylori (H. pylori) 05/22/02   RUT-Positive  . Hypertension   . Myocardial infarction ([place]) 2015  . Squamous carcinoma    "several; scattered over my face, hands, leg some cut off; some burned off"  . Stroke [facility]) 2015   denies residual on 08/07/2015  . Stroke [facility]) 03/18/2017   "has effected her vision, balance, some memory" (03/23/2017)  . SVT (supraventricular tachycardia) (HCC)    a. seen by Dr. Caryl Comes - has loop recorder in. Patient has declined amiodarone due to side effect profile  . Varicose veins     Past Surgical History:  Procedure Laterality Date  . BASAL CELL CARCINOMA EXCISION     "several; scattered over my face, hands, leg some cut off; some burned off"  . CARDIAC CATHETERIZATION N/A 08/07/2015   Procedure: Left Heart Cath and Coronary Angiography;  Surgeon: [name], MD;  Location: [place] CV LAB;  Service: Cardiovascular;  Laterality: N/A;  . CARDIAC CATHETERIZATION N/A 08/07/2015   Procedure: Coronary Stent Intervention;  Surgeon: [name], MD;  Location: [place] CV [facility];  Service: Cardiovascular;  Laterality: N/A;  . CARDIAC CATHETERIZATION N/A 08/07/2015   Procedure: Intravascular Pressure Wire/FFR Study;  Surgeon: [name], MD;  Location:  [place] INVASIVE CV LAB;  Service: Cardiovascular;  Laterality: N/A;  . CATARACT EXTRACTION W/ INTRAOCULAR LENS  IMPLANT, BILATERAL Bilateral 2005  . CLOSED REDUCTION SHOULDER DISLOCATION Left 2016 X 2  . CORNEAL TRANSPLANT Bilateral right 2009, left 2009    3 in left eye (last 2 failed), 1 in right eye  . DILATION AND CURETTAGE OF UTERUS    . EYE SURGERY    . JOINT REPLACEMENT    . [place]   "had gangrene in it"  . LOOP RECORDER IMPLANT N/A 01/05/2014   Procedure: LOOP RECORDER IMPLANT;  Surgeon: Coralyn Mark, MD;  Location: [place] CATH LAB;  Service: Cardiovascular;   Laterality: N/A;  . SQUAMOUS CELL CARCINOMA EXCISION     "several; scattered over my face, hands, leg some cut off; some burned off"  . TONSILLECTOMY    . TOTAL ABDOMINAL HYSTERECTOMY  11/1983   "ovaries and all"  . TOTAL KNEE ARTHROPLASTY Left 06/27/2012   Procedure: LEFT TOTAL KNEE ARTHROPLASTY;  Surgeon: Gearlean Alf, MD;  Location: [facility];  Service: Orthopedics;  Laterality: Left;  . TOTAL KNEE ARTHROPLASTY Right 12/12/2012   Procedure: RIGHT TOTAL KNEE ARTHROPLASTY;  Surgeon: Gearlean Alf, MD;  Location: [facility];  Service: Orthopedics;  Laterality: Right;  . TUBAL LIGATION  1966    Current Medications: Current Meds  Medication Sig  . clopidogrel (PLAVIX) 75 MG tablet take 1 tablet by mouth once daily  . divalproex (DEPAKOTE ER) 250 MG 24 hr tablet Take 1 tablet (250 mg total) by mouth 2 (two) times daily.  . nebivolol (BYSTOLIC) 2.5 MG tablet Take 1 tablet (2.5 mg total) by mouth 2 (two) times daily. (Patient taking differently: Take 2.5 mg by mouth 3 (three) times daily. )  . nitroGLYCERIN (NITROSTAT) 0.4 MG SL tablet Place 1 tablet (0.4 mg total) under the tongue every 5 (five) minutes as needed for chest pain.  . pantoprazole (PROTONIX) 40 MG tablet Take 1 tablet (40 mg total) by mouth 2 (two) times daily before a meal.  . prednisoLONE acetate (PRED FORTE) 1 % ophthalmic suspension Place 1 drop into both eyes See admin instructions. 1 drop into both eyes at bedtime spaced 5 minutes apart from Systane gel  . QUEtiapine (SEROQUEL) 25 MG tablet Take 1 tablet (25 mg total) by mouth at bedtime.  . sertraline (ZOLOFT) 50 MG tablet TAKE 1/2 TABLET BY MOUTH FOR 4 DAYS,THEN 1 TABLET DAILY  . sucralfate (CARAFATE) 1 GM/10ML suspension Take 10 mLs (1 g total) by mouth 4 (four) times daily -  with meals and at bedtime.     Allergies:   Fluorescein; Hydralazine hcl; Sulfa antibiotics; Ultram [tramadol]; Yellow dye; Buprenex [buprenorphine hcl]; Keflex [cephalexin]; Lipitor [atorvastatin];  Restasis [cyclosporine]; Augmentin [amoxicillin-pot clavulanate]; Levofloxacin; Morphine and related; Percocet [oxycodone-acetaminophen]; Tape; and Zetia [ezetimibe]   Social History   Socioeconomic History  . Marital status: Widowed    Spouse name: Not on file  . Number of children: 3  . Years of education: Some college  . Highest education level: Not on file  Occupational History  . Occupation: Retired  Scientific laboratory technician  . Financial resource strain: Not on file  . Food insecurity:    Worry: Not on file    Inability: Not on file  . Transportation needs:    Medical: Not on file    Non-medical: Not on file  Tobacco Use  . Smoking status: Never Smoker  . Smokeless tobacco: Never Used  Substance and Sexual Activity  .  Alcohol use: No  . Drug use: No  . Sexual activity: Never  Lifestyle  . Physical activity:    Days per week: Not on file    Minutes per session: Not on file  . Stress: Not on file  Relationships  . Social connections:    Talks on phone: Not on file    Gets together: Not on file    Attends religious service: Not on file    Active member of club or organization: Not on file    Attends meetings of clubs or organizations: Not on file    Relationship status: Not on file  Other Topics Concern  . Not on file  Social History Narrative   Patient is right handed   Patient lives alone.   Patient drinks 1 cup of coffee daily.     Family History: The patient's family history includes Cancer in her brother; Heart attack in her brother, brother, brother, brother, and father; Parkinsonism in her brother; Stroke in her mother.  ROS:   Please see the history of present illness.    ROS not obtainable from patient.   EKGs/Labs/Other Studies Reviewed:    The following studies were reviewed today: 2D echocardiogram 09/23/2017: Study Conclusions  - Left ventricle: The cavity size was normal. Wall thickness was   normal. Systolic function was normal. The estimated  ejection   fraction was in the range of 60% to 65%. Wall motion was normal;   there were no regional wall motion abnormalities. Features are   consistent with a pseudonormal left ventricular filling pattern,   with concomitant abnormal relaxation and increased filling   pressure (grade 2 diastolic dysfunction). - Aortic valve: Mildly calcified annulus. Trileaflet; mildly   calcified leaflets. There was mild regurgitation. - Mitral valve: There was trivial regurgitation. - Left atrium: The atrium was at the upper limits of normal in   size. - Right atrium: Central venous pressure (est): 3 mm Hg. - Atrial septum: No defect or patent foramen ovale was identified. - Tricuspid valve: There was mild regurgitation. - Pulmonary arteries: PA peak pressure: 30 mm Hg (S). - Pericardium, extracardiac: There was no pericardial effusion.  EKG:  EKG is not ordered today.    Recent Labs: 08/10/2017: TSH 2.320 09/21/2017: ALT 25 09/26/2017: Magnesium 2.0 10/18/2017: Hemoglobin 14.6; Platelets 179 11/18/2017: BUN 20; Creatinine, Ser 0.74; Potassium 3.7; Sodium 137  Recent Lipid Panel    Component Value Date/Time   CHOL 164 09/22/2017 0441   TRIG 54 09/22/2017 0441   HDL 58 09/22/2017 0441   CHOLHDL 2.8 09/22/2017 0441   VLDL 11 09/22/2017 0441   LDLCALC 95 09/22/2017 0441    Physical Exam:    VS:  BP 132/72   Pulse 70   Ht 5' 2.5" (1.588 m)   Wt 168 lb 3.2 oz (76.3 kg)   SpO2 96%   BMI 30.27 kg/m     Wt Readings from Last 3 Encounters:  12/01/17 168 lb 3.2 oz (76.3 kg)  11/18/17 170 lb (77.1 kg)  11/02/17 166 lb 11.2 oz (75.6 kg)     GEN: Elderly woman, agitated, but in no acute distress HEENT: Normal NECK: No JVD; No carotid bruits LYMPHATICS: No lymphadenopathy CARDIAC: RRR, no murmurs, rubs, gallops RESPIRATORY:  Clear to auscultation without rales, wheezing or rhonchi  ABDOMEN: Soft, non-tender, non-distended MUSCULOSKELETAL:  No edema; No deformity  SKIN: Warm and  dry NEUROLOGIC:  Alert and oriented x 3 PSYCHIATRIC:  Normal affect   ASSESSMENT:  1. Coronary artery disease involving native coronary artery of native heart without angina pectoris   2. Malignant hypertension    PLAN:    In order of problems listed above:  1. The patient is treated with clopidogrel and nebivolol.  She does not appear to be having any angina 2. Her blood pressure is uncontrolled.  She should continue on nebivolol twice daily and I will add amlodipine 5 mg daily.  Because of her level of agitation and will be difficult to monitor her blood pressure.  She usually does not allow her daughter to check her blood pressure.  Unfortunately she is severely debilitated after her stroke. I will plan to see her back as needed since it is difficult to bring her into an office environment.   Medication Adjustments/Labs and Tests Ordered: Current medicines are reviewed at length with the patient today.  Concerns regarding medicines are outlined above.  No orders of the defined types were placed in this encounter.  Meds ordered this encounter  Medications  . amLODipine (NORVASC) 5 MG tablet    Sig: Take 1 tablet (5 mg total) by mouth daily.    Dispense:  90 tablet    Refill:  3    Patient Instructions  Medication Instructions:  1) START AMLODIPINE 5 mg daily  Labwork: None  Testing/Procedures: None  Follow-Up: Your provider recommends that you schedule a follow-up appointment AS NEEDED with Dr. Burt Knack.   Signed, [name], MD  12/01/2017 4:08 PM    [facility]

## 2017-12-03 ENCOUNTER — Telehealth: Payer: Self-pay | Admitting: Adult Health

## 2017-12-03 DIAGNOSIS — F01518 Vascular dementia, unspecified severity, with other behavioral disturbance: Secondary | ICD-10-CM

## 2017-12-03 DIAGNOSIS — F0151 Vascular dementia with behavioral disturbance: Secondary | ICD-10-CM

## 2017-12-03 NOTE — Telephone Encounter (Signed)
Called daughter [name] (on [place]) to see how patient has been tolerating Seroquel 25 mg along with increased dose of Depakote 250 mg twice daily.  She states that she has been doing slightly better but is still having occasional outbursts.  She appears to be tolerating medications well without side effects.  Advised daughter that we can increase Depakote dosing if need be or potentially Seroquel dosing if need be.  Daughter states that she would like to see how the weekend goes and will call on Monday if change in medication dosage is needed.  Daughter appreciated the call and no further questions at this time.

## 2017-12-06 MED ORDER — DIVALPROEX SODIUM ER 250 MG PO TB24: ORAL_TABLET | ORAL | 4 refills | Status: DC

## 2017-12-06 NOTE — Telephone Encounter (Signed)
Return [place] call regarding Seroquel and Depakote medications.  She does state that the Seroquel has been helping her mother sleep through the night but due to this, she has been incontinent of urine.  Recommended for possible decrease of Seroquel to 12.5 but [name] would like to continue current dose at this time as her mother has been obtaining adequate night sleep.  Also recommended to limit fluid intake prior to patient going to bed along with ensuring she is voiding prior to bed.  Recommend increase Depakote from 250 twice daily to 500 mg in the morning and 250 mg in the evening.  She has seen some improvement with her mother and states after taking the Depakote, she is stable until approximately 1 PM in the afternoon and then becomes very agitated, yelling and crying.  Recommended larger dose in the morning in hopes of decreasing afternoon agitation.  Psychiatry referral placed for recommendations on treatment options for continued management of her vascular dementia behaviors.  [name] is aware and is in agreement to this.  No further questions or concerns at this time and appreciated the phone call.  Plan: Depakote 500 mg (2 tabs) in the morning and continue to 50 mg in the evening Continue Seroquel 25 mg at night Psychiatry referral placed

## 2017-12-06 NOTE — Telephone Encounter (Signed)
[name] patient's daughter calling back to discuss Seroquel and Depakote. Taking Seroquel she thinks makes patient too sleepy or the combination of both medications. Please call and discuss.

## 2017-12-06 NOTE — Addendum Note (Signed)
Addended by: Venancio Poisson on: 12/06/2017 03:45 PM   Modules accepted: Orders

## 2017-12-06 NOTE — Telephone Encounter (Signed)
Spoke with patient's daughter [name], she mentioned that when her mom takes the Seroquel at bedtime that she is she knocked out that she doesn't wake up to go the bathroom or by the time she realizes that she needs to go to the restroom she has already wet herself. [name] also mention that her mother is still having some outburst(yelling at people and crying), she stated that their was some improvement. [name] would like to speak with you personally.

## 2017-12-08 NOTE — Telephone Encounter (Signed)
Spoke to [place] regarding recent concerns.  Recommended to decrease Seroquel dose from 25 mg to 12.5 mg.  Plan on reevaluating on 12/13/2017 to see if these behaviors continue.  If so we will stop Seroquel completely.  Unsure if these new behaviors are related to Seroquel or Depakote but it was favored to decrease Seroquel dose of her Depakote.  Daughter is in agreement to this plan and will call earlier if needed with questions or concerns.

## 2017-12-08 NOTE — Telephone Encounter (Signed)
Rn spoke with daughter [name] [name] about her moms behavior being upset.The RN ask her to give examples. The daughter stated her mom yells at the staff who is caring for her. She is always saying "I want to go home, please please, where is my home" The daughter states pt is in her home with care with family members helping,and caregivers. The pt is not hitting, or fighting family or staff.The daughter states she just yells,and pleading.The daughter states the staff,and family try to redirect the pt back to reality. [name] [name] stated the staff is always calling her at work when pt has a yelling outburst.The family always tell the  pt' [place] you are in your home".THe pt continues to repeat herself,and is crying at times. [name] [name] wanted to know if the seroquel could be the cause or should it be adjusted. Rn stated message will be sent to [facility] NP. [name] [name] verbalized understanding.

## 2017-12-08 NOTE — Telephone Encounter (Signed)
[name] [name] telephone [phone] -- fax [phone]   where Urgent referral was sent . Office is still processing.  Called and spoke to daughter and relayed it may be a couple more days for insurance. Daughter understood I gave her telephone as well.   Janett Billow , Daughter still want's you to call her when you can.

## 2017-12-08 NOTE — Telephone Encounter (Signed)
Pts daughter [name] requesting a call stating that when the pt wakes up and throughout the day she is very upset unsure if this is related to QUEtiapine (SEROQUEL) 25 MG tablet or the change in Depakote. Please advise

## 2017-12-09 ENCOUNTER — Telehealth: Payer: Self-pay | Admitting: Cardiovascular Disease

## 2017-12-09 NOTE — Telephone Encounter (Signed)
As decreased dose did not help with behaviors, these behaviors are most likely dementia related. I would recommend to restart Seroquel 25mg  at night but also use a dose as needed in the morning/early afternoon for increased agitation. If daughter in agreement, I will place order for additional Seroquel as needed.

## 2017-12-09 NOTE — Telephone Encounter (Signed)
Pts daughter called stating she is still having difficulty with the pt. Requesting a call back to discuss  behaviors that are related to Seroquel or Depakote

## 2017-12-09 NOTE — Telephone Encounter (Signed)
thx

## 2017-12-09 NOTE — Telephone Encounter (Signed)
Rn call patients daughter [name] back about he moms behaviors increasing. RN ask [name] to write down the new change of seroquel orders and dosage change. RN stated per Janett Billow NP note, a decrease dose did not help with behaviors, these behaviors are most likely dementia related. Janett Billow Np recommend to restart 25 mg seroqeul at night, and use a dosage as needed in the morning or early afternoon for increase behavior issues,and agitation. The daughter wrote down the new seroquel instructions. The daughter stated there was a nurse at the house with her mom,and wanting to know if pt can have a injection for the agitation like ativan. Rn requested to speak with the nurse at pts house. The nurse at the house ask If Janett Billow NP can write orders for IM injection ativan.The home nurse stated she can give the injection if she have orders. RN stated per Janett Billow IM injections for agitation is not recommended for pts in outpatient. THe nurse stated she can give it her. The home RN verbalized understanding no IM injections will be order. [name] pts daughter got back on the phone, and stated she only 14 pills  Rn stated Janett Billow NP will send a new seroquel order. While on the phone with the daughter, and nurse pt could be heard banging on the door, and yelling,and talking loud in the background.[name] verbalized everything in this phone message.

## 2017-12-09 NOTE — Telephone Encounter (Signed)
New Message   Pt c/o medication issue:  1. Name of Medication: CBD   2. How are you currently taking this medication (dosage and times per day)?   3. Are you having a reaction (difficulty breathing--STAT)?   4. What is your medication issue? Patients daughter is calling because the patients has been a bit more agitated and dealing with anxiety here lately. They are working with another provider to see about starting patient on a CBD and they would like to discuss with the provider.

## 2017-12-10 MED ORDER — QUETIAPINE FUMARATE 25 MG PO TABS: ORAL_TABLET | ORAL | 3 refills | Status: DC

## 2017-12-10 NOTE — Telephone Encounter (Signed)
Left message on patients voicemail to call office.   

## 2017-12-10 NOTE — Addendum Note (Signed)
Addended by: Venancio Poisson on: 12/10/2017 07:30 AM   Modules accepted: Orders

## 2017-12-10 NOTE — Telephone Encounter (Signed)
Order placed for seroquel 25mg  night and 1 tab PRN during the day

## 2017-12-13 ENCOUNTER — Emergency Department (HOSPITAL_COMMUNITY): Payer: PPO

## 2017-12-13 ENCOUNTER — Encounter (HOSPITAL_COMMUNITY): Payer: Self-pay | Admitting: Emergency Medicine

## 2017-12-13 ENCOUNTER — Emergency Department (HOSPITAL_COMMUNITY)
Admission: EM | Admit: 2017-12-13 | Discharge: 2017-12-15 | Disposition: A | Discharge status: Home / Self Care | Payer: PPO | Attending: Emergency Medicine | Admitting: Emergency Medicine

## 2017-12-13 DIAGNOSIS — I2510 Atherosclerotic heart disease of native coronary artery without angina pectoris: Secondary | ICD-10-CM | POA: Diagnosis not present

## 2017-12-13 DIAGNOSIS — Z96653 Presence of artificial knee joint, bilateral: Secondary | ICD-10-CM | POA: Insufficient documentation

## 2017-12-13 DIAGNOSIS — F29 Unspecified psychosis not due to a substance or known physiological condition: Secondary | ICD-10-CM | POA: Diagnosis not present

## 2017-12-13 DIAGNOSIS — F0151 Vascular dementia with behavioral disturbance: Secondary | ICD-10-CM | POA: Diagnosis not present

## 2017-12-13 DIAGNOSIS — F01518 Vascular dementia, unspecified severity, with other behavioral disturbance: Secondary | ICD-10-CM

## 2017-12-13 DIAGNOSIS — F918 Other conduct disorders: Secondary | ICD-10-CM | POA: Diagnosis not present

## 2017-12-13 DIAGNOSIS — R4689 Other symptoms and signs involving appearance and behavior: Secondary | ICD-10-CM

## 2017-12-13 DIAGNOSIS — R4182 Altered mental status, unspecified: Secondary | ICD-10-CM | POA: Diagnosis not present

## 2017-12-13 DIAGNOSIS — R451 Restlessness and agitation: Secondary | ICD-10-CM | POA: Diagnosis not present

## 2017-12-13 DIAGNOSIS — Z85828 Personal history of other malignant neoplasm of skin: Secondary | ICD-10-CM | POA: Diagnosis not present

## 2017-12-13 DIAGNOSIS — I10 Essential (primary) hypertension: Secondary | ICD-10-CM | POA: Diagnosis not present

## 2017-12-13 LAB — CBC WITH DIFFERENTIAL/PLATELET
BASOS PCT: 0 %
Basophils Absolute: 0 10*3/uL (ref 0.0–0.1)
EOS ABS: 0 10*3/uL (ref 0.0–0.7)
Eosinophils Relative: 0 %
HCT: 46.4 % — ABNORMAL HIGH (ref 36.0–46.0)
Hemoglobin: 15.1 g/dL — ABNORMAL HIGH (ref 12.0–15.0)
Lymphocytes Relative: 17 %
Lymphs Abs: 1 10*3/uL (ref 0.7–4.0)
MCH: 31.4 pg (ref 26.0–34.0)
MCHC: 32.5 g/dL (ref 30.0–36.0)
MCV: 96.5 fL (ref 78.0–100.0)
MONOS PCT: 8 %
Monocytes Absolute: 0.4 10*3/uL (ref 0.1–1.0)
Neutro Abs: 4.1 10*3/uL (ref 1.7–7.7)
Neutrophils Relative %: 75 %
Platelets: 199 10*3/uL (ref 150–400)
RBC: 4.81 MIL/uL (ref 3.87–5.11)
RDW: 14.3 % (ref 11.5–15.5)
WBC: 5.6 10*3/uL (ref 4.0–10.5)

## 2017-12-13 LAB — COMPREHENSIVE METABOLIC PANEL
ALT: 20 U/L (ref 0–44)
ANION GAP: 10 (ref 5–15)
AST: 28 U/L (ref 15–41)
Albumin: 4.1 g/dL (ref 3.5–5.0)
Alkaline Phosphatase: 83 U/L (ref 38–126)
BUN: 16 mg/dL (ref 8–23)
CO2: 29 mmol/L (ref 22–32)
Calcium: 9.2 mg/dL (ref 8.9–10.3)
Chloride: 102 mmol/L (ref 98–111)
Creatinine, Ser: 0.57 mg/dL (ref 0.44–1.00)
GFR calc Af Amer: >60 mL/min (ref >60)
GFR calc non Af Amer: >60 mL/min (ref >60)
Glucose, Bld: 99 mg/dL (ref 70–99)
POTASSIUM: 4.3 mmol/L (ref 3.5–5.1)
SODIUM: 141 mmol/L (ref 135–145)
Total Bilirubin: 0.6 mg/dL (ref 0.3–1.2)
Total Protein: 7.1 g/dL (ref 6.5–8.1)

## 2017-12-13 LAB — RAPID URINE DRUG SCREEN, HOSP PERFORMED
AMPHETAMINES: NOT DETECTED
BARBITURATES: NOT DETECTED
BENZODIAZEPINES: POSITIVE — AB
COCAINE: NOT DETECTED
Opiates: NOT DETECTED
TETRAHYDROCANNABINOL: NOT DETECTED

## 2017-12-13 LAB — URINALYSIS, ROUTINE W REFLEX MICROSCOPIC
Bilirubin Urine: NEGATIVE
GLUCOSE, UA: NEGATIVE mg/dL
HGB URINE DIPSTICK: NEGATIVE
KETONES UR: NEGATIVE mg/dL
Leukocytes, UA: NEGATIVE
Nitrite: NEGATIVE
PROTEIN: NEGATIVE mg/dL
Specific Gravity, Urine: 1.004 — ABNORMAL LOW (ref 1.005–1.030)
pH: 8 (ref 5.0–8.0)

## 2017-12-13 LAB — VALPROIC ACID LEVEL: Valproic Acid Lvl: 62 ug/mL (ref 50.0–100.0)

## 2017-12-13 MED ORDER — HALOPERIDOL LACTATE 5 MG/ML IJ SOLN
5.0000 mg | Freq: Once | INTRAMUSCULAR | Status: AC
  Administered 2017-12-13: 5 mg via INTRAMUSCULAR
  Filled 2017-12-13: qty 1

## 2017-12-13 MED ORDER — ZIPRASIDONE MESYLATE 20 MG IM SOLR
10.0000 mg | Freq: Once | INTRAMUSCULAR | Status: AC
  Administered 2017-12-13: 10 mg via INTRAMUSCULAR
  Filled 2017-12-13: qty 20

## 2017-12-13 MED ORDER — LACTATED RINGERS IV BOLUS
1000.0000 mL | Freq: Once | INTRAVENOUS | Status: AC
  Administered 2017-12-13: 1000 mL via INTRAVENOUS

## 2017-12-13 NOTE — ED Provider Notes (Signed)
Emergency Department Provider Note   I have reviewed the triage vital signs and the nursing notes.   HISTORY  Chief Complaint Aggressive Behavior   HPI [name] [name] is a 84 y.o. female with multiple medical problems documented below the presents to the emergency department today with aggressive behavior.  She is coming by her 2 daughters and states that she had a stroke a couple months ago and since that time she is a progressive worsening aggressiveness.  She is been started on Seroquel and Depakote however does not seem to be helping.  Over the last week it has been progressively worsened to the point where she is leaving the house and walking around in traffic yelling and delusional and paranoid that the medications are not working for her.  She refuses to go home but her cardiology medicines.  PCP sent her here for further evaluation.  Apparently none this was present prior to the stroke. No known infectious symptoms. Daughter at bedside is HCPOA.  No other associated or modifying symptoms.    Past Medical History:  Diagnosis Date  . Arthritis    "thumbs, joints" (03/23/2017  . Basal cell carcinoma    "several; scattered over my face, hands, leg some cut off; some burned off"  . Blind left eye    a. Corneal transplant x3 with rejection  . CAD (coronary artery disease)    a. s/p NSTEMI 2015 treated conservatively; nuclear stress test low risk. b. Continued angina despite med rx 07/2015 - s/p Bio Freedom Stent to ramus intermedius with mod LAD stenosis neg by FFR. // Nuc study 5/19:  EF 67, no ischemia or scar; Low Risk   . Complication of anesthesia    "very sensitive to RX"  . Diverticulitis large intestine   . Diverticulosis   . Family history of adverse reaction to anesthesia    "we all get PONV" (1/1/201)  . Gastritis   . GERD (gastroesophageal reflux disease)   . Hayfever   . Helicobacter pylori (H. pylori) 05/22/02   RUT-Positive  . Hypertension   . Myocardial  infarction ([place]) 2015  . Squamous carcinoma    "several; scattered over my face, hands, leg some cut off; some burned off"  . Stroke [facility]) 2015   denies residual on 08/07/2015  . Stroke [facility]) 03/18/2017   "has effected her vision, balance, some memory" (03/23/2017)  . SVT (supraventricular tachycardia) (HCC)    a. seen by Dr. Caryl Comes - has loop recorder in. Patient has declined amiodarone due to side effect profile  . Varicose veins     Patient Active Problem List   Diagnosis Date Noted  . Epigastric pain 11/18/2017  . Belching 11/18/2017  . Diastolic dysfunction   . Hypokalemia   . Seizures ([place])   . PAF (paroxysmal atrial fibrillation) ([place])   . Cerebral embolism with cerebral infarction 09/21/2017  . Acute CVA (cerebrovascular accident) ([place]) 09/21/2017  . Blind 04/02/2017  . Visual field cut- right 04/02/2017  . Gastroesophageal reflux disease   . Pre-diabetes   . Blind left eye- (Fuch's disease) 03/05/2015  . Essential hypertension 12/19/2014  . CAD (coronary artery disease) 12/19/2014  . History of Rt brain stroke Oct 2015 06/14/2014  . Headache 06/14/2014  . Malignant hypertension 01/20/2014  . Aphasia 01/04/2014  . Paresthesia 01/04/2014  . Generalized abdominal pain 01/01/2014  . History of diverticulitis 01/01/2014  . Diaphoresis 12/29/2012  . Sleep disturbance 10/03/2012  . Depression 07/12/2012  . OA (osteoarthritis) of knee 06/27/2012  .  History of PSVT 12/15/2011    Past Surgical History:  Procedure Laterality Date  . BASAL CELL CARCINOMA EXCISION     "several; scattered over my face, hands, leg some cut off; some burned off"  . CARDIAC CATHETERIZATION N/A 08/07/2015   Procedure: Left Heart Cath and Coronary Angiography;  Surgeon: [name], MD;  Location: [place] [facility];  Service: Cardiovascular;  Laterality: N/A;  . CARDIAC CATHETERIZATION N/A 08/07/2015   Procedure: Coronary Stent Intervention;  Surgeon: [name], MD;  Location: [place]  [facility];  Service: Cardiovascular;  Laterality: N/A;  . CARDIAC CATHETERIZATION N/A 08/07/2015   Procedure: Intravascular Pressure Wire/FFR Study;  Surgeon: [name], MD;  Location: [place] [facility];  Service: Cardiovascular;  Laterality: N/A;  . CATARACT EXTRACTION W/ INTRAOCULAR LENS  IMPLANT, BILATERAL Bilateral 2005  . CLOSED REDUCTION SHOULDER DISLOCATION Left 2016 X 2  . CORNEAL TRANSPLANT Bilateral right 2009, left 2009    3 in left eye (last 2 failed), 1 in right eye  . DILATION AND CURETTAGE OF UTERUS    . EYE SURGERY    . JOINT REPLACEMENT    . [place]   "had gangrene in it"  . LOOP RECORDER IMPLANT N/A 01/05/2014   Procedure: LOOP RECORDER IMPLANT;  Surgeon: Coralyn Mark, MD;  Location: [place] C[facility];  Service: Cardiovascular;  Laterality: N/A;  . SQUAMOUS CELL CARCINOMA EXCISION     "several; scattered over my face, hands, leg some cut off; some burned off"  . TONSILLECTOMY    . TOTAL ABDOMINAL HYSTERECTOMY  11/1983   "ovaries and all"  . TOTAL KNEE ARTHROPLASTY Left 06/27/2012   Procedure: LEFT TOTAL KNEE ARTHROPLASTY;  Surgeon: Gearlean Alf, MD;  Location: [facility];  Service: Orthopedics;  Laterality: Left;  . TOTAL KNEE ARTHROPLASTY Right 12/12/2012   Procedure: RIGHT TOTAL KNEE ARTHROPLASTY;  Surgeon: Gearlean Alf, MD;  Location: [facility];  Service: Orthopedics;  Laterality: Right;  . TUBAL LIGATION  1966    Current Outpatient Rx  . Order #: [id number] Class: Historical Med  . Order #: [id number] Class: Normal  . Order #: [id number] Class: Historical Med  . Order #: [id number] Class: Normal  . Order #: [id number] Class: Historical Med  . Order #: [id number] Class: Normal  . Order #: [id number] Class: Normal  . Order #: [id number] Class: Historical Med  . Order #: [id number] Class: Normal  . Order #: [id number] Class: Historical Med  . Order #: [id number] Class: Normal  . Order #: [id number] Class: Normal    Allergies Fluorescein; Hydralazine hcl; Sulfa  antibiotics; Ultram [tramadol]; Yellow dye; Buprenex [buprenorphine hcl]; Keflex [cephalexin]; Lipitor [atorvastatin]; Restasis [cyclosporine]; Augmentin [amoxicillin-pot clavulanate]; Levofloxacin; Morphine and related; Percocet [oxycodone-acetaminophen]; Tape; and Zetia [ezetimibe]  Family History  Problem Relation Age of Onset  . Stroke Mother   . Heart attack Father   . Heart attack Brother   . Cancer Brother   . Heart attack Brother   . Heart attack Brother   . Heart attack Brother   . Parkinsonism Brother     Social History Social History   Tobacco Use  . Smoking status: Never Smoker  . Smokeless tobacco: Never Used  Substance Use Topics  . Alcohol use: No  . Drug use: No    Review of Systems  All other systems negative except as documented in the HPI. All pertinent positives and negatives as reviewed in the HPI. ____________________________________________   PHYSICAL EXAM:  VITAL SIGNS: ED Triage Vitals  Enc Vitals Group     BP  12/13/17 1152 (!) 169/72     Pulse Rate 12/13/17 1152 90     Resp 12/13/17 1152 19     Temp 12/13/17 1407 98 F (36.7 C)     Temp Source 12/13/17 1407 Axillary     SpO2 12/13/17 1152 97 %    Constitutional: Alert and disoriented. Easily distracted and agitated.  Eyes: L eye hazy and decreased EOM Head: Atraumatic. Nose: No congestion/rhinnorhea. Mouth/Throat: Mucous membranes are moist.  Oropharynx non-erythematous. Neck: No stridor.  No meningeal signs.   Cardiovascular: Normal rate, regular rhythm. Good peripheral circulation. Grossly normal heart sounds.   Respiratory: Normal respiratory effort.  No retractions. Lungs CTAB. Gastrointestinal: Soft and nontender. No distention.  Musculoskeletal: No lower extremity tenderness nor edema. No gross deformities of extremities. Neurologic:  Normal speech and language. No gross focal neurologic deficits are appreciated aside from left eye immobility and haziness..  Skin:  Skin is warm,  dry and intact. No rash noted.  ____________________________________________   LABS (all labs ordered are listed, but only abnormal results are displayed)  Labs Reviewed  CBC WITH DIFFERENTIAL/PLATELET - Abnormal; Notable for the following components:      Result Value   Hemoglobin 15.1 (*)    HCT 46.4 (*)    All other components within normal limits  RAPID URINE DRUG SCREEN, HOSP PERFORMED - Abnormal; Notable for the following components:   Benzodiazepines POSITIVE (*)    All other components within normal limits  URINALYSIS, ROUTINE W REFLEX MICROSCOPIC - Abnormal; Notable for the following components:   Specific Gravity, Urine 1.004 (*)    All other components within normal limits  COMPREHENSIVE METABOLIC PANEL  VALPROIC ACID LEVEL   ____________________________________________  EKG   EKG Interpretation  Date/Time:  Monday December 13 2017 13:57:11 EDT Ventricular Rate:  82 PR Interval:    QRS Duration: 94 QT Interval:  396 QTC Calculation: 463 R Axis:   57 Text Interpretation:  Sinus rhythm No significant change since last tracing Confirmed by Merrily Pew [phone]) on 12/13/2017 3:01:02 PM       ____________________________________________  RADIOLOGY  Ct Head Wo Contrast  Result Date: 12/13/2017 CLINICAL DATA:  Altered mental status. EXAM: CT HEAD WITHOUT CONTRAST TECHNIQUE: Contiguous axial images were obtained from the base of the skull through the vertex without intravenous contrast. COMPARISON:  09/21/2017 and 03/18/2017 FINDINGS: Brain: Ventricles and cisterns as well as CSF spaces are within normal. Exam demonstrates evidence of patient's old right occipital infarct. There is also evidence of old left MCA infarct. Evidence of chronic ischemic microvascular disease. No mass, mass effect, shift of midline structures or acute hemorrhage. No evidence of acute infarction. Remainder of the exam is unchanged. Vascular: No hyperdense vessel or unexpected calcification.  Skull: Normal. Negative for fracture or focal lesion. Sinuses/Orbits: No acute finding. Other: None. IMPRESSION: No acute intracranial findings. Old left MCA and right occipital infarct. Chronic ischemic microvascular disease. Electronically Signed   By: Marin Olp M.D.   On: 12/13/2017 16:06    ____________________________________________  INITIAL IMPRESSION / ASSESSMENT AND PLAN / ED COURSE Evaluate for medical causes for the patient's aggression, paranoia and delusions however may end up needing [facility] psych consultation.  Medical workup unremarkable. Will consult TTS, however may need ALF/SNF placement if not candidate for geri-psych.   Pertinent labs & imaging results that were available during my care of the patient were reviewed by me and considered in my medical decision making (see chart for details).  ____________________________________________  FINAL CLINICAL IMPRESSION(S) /  ED DIAGNOSES  Final diagnoses:  Aggressive behavior  Agitation  Psychosis, unspecified psychosis type ([place])    MEDICATIONS GIVEN DURING THIS VISIT:  Medications  ziprasidone (GEODON) injection 10 mg (10 mg Intramuscular Given 12/13/17 1344)  lactated ringers bolus 1,000 mL (0 mLs Intravenous Stopped 12/13/17 1523)     NEW OUTPATIENT MEDICATIONS STARTED DURING THIS VISIT:  New Prescriptions   No medications on file    Note:  This note was prepared with assistance of Dragon voice recognition software. Occasional wrong-word or sound-a-like substitutions may have occurred due to the inherent limitations of voice recognition software.   Merrily Pew, MD 12/13/17 [phone]

## 2017-12-13 NOTE — ED Triage Notes (Signed)
Pt brought in by family that has been staying with her since the stroke couple months ago, for more AMS and aggressive behavior.

## 2017-12-13 NOTE — BH Assessment (Signed)
Assessment Note  [name] [name] is an 84 y.o. female that presents this date with altered mental status. Patient is unable to participate in the assessment due to current impairment and information to complete assessment was provided by relative [name] [name] Son [place]) who was present. Per collateral patient suffered a stroke two months ago and behavioral issues have worsened since then to include increased aggression and paranoia. Per relative patient has a POA (Daughter [name] [name]) who has been assisting with patient care since patient has ben decompensating. Patient currently resides in her home with 24 hour health care since her stroke. Patient has no prior history of any mental disorder prior to stroke. Patient has never attempted self harm or seems (per collateral) to be responding to any internal stimuli or AVH. Patient does believe at times, that there are strangers in her home although relative states patient does not seem to be "seeing or hearing things." Patient requires assistance with most ADL's although can still ambulate with assistance due to unsteady gait. Patient has been receiving OP services from [facility] MD at [place] who assist with medication management for worsening symptoms of aggression although relative present states "they are not working" as evidenced by patient's worsening behaviors. Patient is observed to be drowsy and will not respond to this writer's questions. Collateral from relative presents reports patient is not oriented to time/place and has been highly disorganized since her stroke. Per notes, "Patient presents with multiple medical problems to the emergency department today with aggressive behavior. She is coming by her 2 daughters and states that she had a stroke a couple months ago and since that time she is a progressive worsening aggressiveness. She is been started on Seroquel and Depakote however does not seem to be helping. Over the last week it has been  progressively worsened to the point where she is leaving the house and walking around in traffic yelling and delusional and paranoid that the medications are not working for her. Apparently none this was present prior to the stroke". Case was staffed with Romilda Garret FNP who recommended patient be monitored and observed for safety and will be seen by psychiatry in the a.m.      Diagnosis: Deferred   Past Medical History:  Past Medical History:  Diagnosis Date  . Arthritis    "thumbs, joints" (03/23/2017  . Basal cell carcinoma    "several; scattered over my face, hands, leg some cut off; some burned off"  . Blind left eye    a. Corneal transplant x3 with rejection  . CAD (coronary artery disease)    a. s/p NSTEMI 2015 treated conservatively; nuclear stress test low risk. b. Continued angina despite med rx 07/2015 - s/p Bio Freedom Stent to ramus intermedius with mod LAD stenosis neg by FFR. // Nuc study 5/19:  EF 67, no ischemia or scar; Low Risk   . Complication of anesthesia    "very sensitive to RX"  . Diverticulitis large intestine   . Diverticulosis   . Family history of adverse reaction to anesthesia    "we all get PONV" (1/1/201)  . Gastritis   . GERD (gastroesophageal reflux disease)   . Hayfever   . Helicobacter pylori (H. pylori) 05/22/02   RUT-Positive  . Hypertension   . Myocardial infarction ([place]) 2015  . Squamous carcinoma    "several; scattered over my face, hands, leg some cut off; some burned off"  . Stroke [facility]) 2015   denies residual on 08/07/2015  .  Stroke [facility]) 03/18/2017   "has effected her vision, balance, some memory" (03/23/2017)  . SVT (supraventricular tachycardia) (HCC)    a. seen by Dr. Caryl Comes - has loop recorder in. Patient has declined amiodarone due to side effect profile  . Varicose veins     Past Surgical History:  Procedure Laterality Date  . BASAL CELL CARCINOMA EXCISION     "several; scattered over my face, hands, leg some cut off; some burned off"   . CARDIAC CATHETERIZATION N/A 08/07/2015   Procedure: Left Heart Cath and Coronary Angiography;  Surgeon: [name], MD;  Location: [place] CV LAB;  Service: Cardiovascular;  Laterality: N/A;  . CARDIAC CATHETERIZATION N/A 08/07/2015   Procedure: Coronary Stent Intervention;  Surgeon: [name], MD;  Location: [place] CV LAB;  Service: Cardiovascular;  Laterality: N/A;  . CARDIAC CATHETERIZATION N/A 08/07/2015   Procedure: Intravascular Pressure Wire/FFR Study;  Surgeon: [name], MD;  Location: [place] CV LAB;  Service: Cardiovascular;  Laterality: N/A;  . CATARACT EXTRACTION W/ INTRAOCULAR LENS  IMPLANT, BILATERAL Bilateral 2005  . CLOSED REDUCTION SHOULDER DISLOCATION Left 2016 X 2  . CORNEAL TRANSPLANT Bilateral right 2009, left 2009    3 in left eye (last 2 failed), 1 in right eye  . DILATION AND CURETTAGE OF UTERUS    . EYE SURGERY    . JOINT REPLACEMENT    . [place]   "had gangrene in it"  . LOOP RECORDER IMPLANT N/A 01/05/2014   Procedure: LOOP RECORDER IMPLANT;  Surgeon: Coralyn Mark, MD;  Location: [place] CATH LAB;  Service: Cardiovascular;  Laterality: N/A;  . SQUAMOUS CELL CARCINOMA EXCISION     "several; scattered over my face, hands, leg some cut off; some burned off"  . TONSILLECTOMY    . TOTAL ABDOMINAL HYSTERECTOMY  11/1983   "ovaries and all"  . TOTAL KNEE ARTHROPLASTY Left 06/27/2012   Procedure: LEFT TOTAL KNEE ARTHROPLASTY;  Surgeon: Gearlean Alf, MD;  [facility];  Service: Orthopedics;  Laterality: Left;  . TOTAL KNEE ARTHROPLASTY Right 12/12/2012   Procedure: RIGHT TOTAL KNEE ARTHROPLASTY;  Surgeon: Gearlean Alf, MD;  Location: [facility];  Service: Orthopedics;  Laterality: Right;  . TUBAL LIGATION  1966    Family History:  Family History  Problem Relation Age of Onset  . Stroke Mother   . Heart attack Father   . Heart attack Brother   . Cancer Brother   . Heart attack Brother   . Heart attack Brother    . Heart attack Brother   . Parkinsonism Brother     Social History:  reports that she has never smoked. She has never used smokeless tobacco. She reports that she does not drink alcohol or use drugs.  Additional Social History:  Alcohol / Drug Use Pain Medications: See MAR Prescriptions: See MAR Over the Counter: See MAR History of alcohol / drug use?: No history of alcohol / drug abuse Longest period of sobriety (when/how long): NA Negative Consequences of Use: (Denies) Withdrawal Symptoms: (Denies)  CIWA: CIWA-Ar BP: 117/69 Pulse Rate: 81 COWS:    Allergies:  Allergies  Allergen Reactions  . Fluorescein Anaphylaxis, Shortness Of Breath, Itching and Swelling  . Hydralazine Hcl Anaphylaxis, Swelling, Palpitations and Rash  . Sulfa Antibiotics Rash and Other (See Comments)    Other Reaction: red bumps  . Ultram [Tramadol] Hives  . Yellow Dye Anaphylaxis, Itching, Swelling and Other (See Comments)    Fluorescein (in eye drops)  .  Buprenex [Buprenorphine Hcl] Palpitations and Other (See Comments)  . Keflex [Cephalexin] Diarrhea  . Lipitor [Atorvastatin] Other (See Comments)    Muscle weakness  . Restasis [Cyclosporine] Swelling    redness of face with slight swelling   . Augmentin [Amoxicillin-Pot Clavulanate] Nausea And Vomiting  . Levofloxacin Other (See Comments)    Reaction not recalled  . Morphine And Related Other (See Comments)    agitation   . Percocet [Oxycodone-Acetaminophen] Nausea And Vomiting  . Tape Other (See Comments)    Adhesive tape pulls off the skin Able to tolerate paper tape  . Zetia [Ezetimibe] Other (See Comments)    Myalgias    Home Medications:  (Not in a hospital admission)  OB/GYN Status:  No LMP recorded. Patient has had a hysterectomy.  General Assessment Data [facility] of [facility]: [facility] TTS Assessment: In system Is this a Tele or Face-to-Face Assessment?: Face-to-Face Is this an Initial Assessment or a Re-assessment for this  encounter?: Initial Assessment Patient Accompanied by:: (NA) Language Other than English: No Living Arrangements: (Alone with health care ) What gender do you identify as?: Female Marital status: Widowed [place] name: NA Pregnancy Status: No Living Arrangements: Alone Can pt return to current living arrangement?: No Admission Status: Voluntary Is patient capable of signing voluntary admission?: Yes Referral Source: Self/Family/Friend Insurance type: Systems developer Exam ([place]) Medical Exam completed: Yes  Crisis Care Plan Living Arrangements: Alone Legal Guardian: (NA) Name of Psychiatrist: None Name of Therapist: None  Education Status Is patient currently in school?: No Is the patient employed, unemployed or receiving disability?: Unemployed  Risk to self with the past 6 months Suicidal Ideation: No Has patient been a risk to self within the past 6 months prior to admission? : No Suicidal Intent: No Has patient had any suicidal intent within the past 6 months prior to admission? : No Is patient at risk for suicide?: No Suicidal Plan?: No Has patient had any suicidal plan within the past 6 months prior to admission? : No Access to Means: No What has been your use of drugs/alcohol within the last 12 months?: Denies Previous Attempts/Gestures: No How many times?: 0 Other Self Harm Risks: NA Triggers for Past Attempts: Unknown Intentional Self Injurious Behavior: None Family Suicide History: No Recent stressful life event(s): (NA) Persecutory voices/beliefs?: No Depression: (UTA) Depression Symptoms: (UTA) Substance abuse history and/or treatment for substance abuse?: No Suicide prevention information given to non-admitted patients: Not applicable  Risk to Others within the past 6 months Homicidal Ideation: No Does patient have any lifetime risk of violence toward others beyond the six months prior to admission? : No Thoughts of Harm to Others:  No Current Homicidal Intent: No Current Homicidal Plan: No Access to Homicidal Means: No Identified Victim: NA History of harm to others?: No Assessment of Violence: None Noted Violent Behavior Description: NA Does patient have access to weapons?: No Criminal Charges Pending?: No Does patient have a court date: No Is patient on probation?: No  Psychosis Hallucinations: None noted Delusions: None noted  Mental Status Report Appearance/Hygiene: Unremarkable Eye Contact: Unable to Assess Motor Activity: Unremarkable Speech: Unable to assess Level of Consciousness: Sleeping Mood: (UTA) Affect: (UTA) Anxiety Level: (UTA) Thought Processes: (UTA) Judgement: (UTA) Orientation: (UTA) Obsessive Compulsive Thoughts/Behaviors: (UTA)  Cognitive Functioning Concentration: Unable to Assess Memory: Unable to Assess Is patient IDD: No Insight: Unable to Assess Impulse Control: Unable to Assess Appetite: Fair(Per collateral) Have you had any weight changes? : No Change  Sleep: No Change Total Hours of Sleep: 7(Per collateral ) Vegetative Symptoms: None  ADLScreening [facility] Assessment Services) Patient's cognitive ability adequate to safely complete daily activities?: No Patient able to express need for assistance with ADLs?: Yes Independently performs ADLs?: No  Prior Inpatient Therapy Prior Inpatient Therapy: No  Prior Outpatient Therapy Prior Outpatient Therapy: Yes Prior Therapy Dates: Ongoing Prior Therapy Facilty/Provider(s): In[facility] Mer[facility] Reason for Treatment: Med mang Does patient have an ACCT team?: No Does patient have Intensive In-House Services?  : No Does patient have [facility] services? : No Does patient have P4CC services?: No  ADL Screening (condition at time of admission) Patient's cognitive ability adequate to safely complete daily activities?: No Is the patient deaf or have difficulty hearing?: Yes Does the patient have difficulty seeing, even when wearing  glasses/contacts?: Yes Does the patient have difficulty concentrating, remembering, or making decisions?: Yes Patient able to express need for assistance with ADLs?: Yes Does the patient have difficulty dressing or bathing?: Yes Independently performs ADLs?: No Communication: Needs assistance Is this a change from baseline?: Pre-admission baseline Dressing (OT): Needs assistance Is this a change from baseline?: Pre-admission baseline Grooming: Needs assistance Is this a change from baseline?: Pre-admission baseline Feeding: Needs assistance Is this a change from baseline?: Pre-admission baseline Bathing: Needs assistance Is this a change from baseline?: Pre-admission baseline Toileting: Needs assistance Is this a change from baseline?: Pre-admission baseline In/Out Bed: Needs assistance Is this a change from baseline?: Pre-admission baseline Walks in Home: Independent Does the patient have difficulty walking or climbing stairs?: Yes Weakness of Legs: Both Weakness of Arms/Hands: Both  Home Assistive Devices/Equipment Home Assistive Devices/Equipment: None  Therapy Consults (therapy consults require a physician order) PT Evaluation Needed: No OT Evalulation Needed: No SLP Evaluation Needed: No Abuse/Neglect Assessment (Assessment to be complete while patient is alone) Physical Abuse: Denies Verbal Abuse: Denies Sexual Abuse: Denies Exploitation of patient/patient's resources: Denies Self-Neglect: Denies Values / Beliefs Cultural Requests During Hospitalization: None Spiritual Requests During Hospitalization: None Consults Spiritual Care Consult Needed: No Social Work Consult Needed: No Regulatory affairs officer (For Healthcare) Does Patient Have a Medical Advance Directive?: No Would patient like information on creating a medical advance directive?: No - Patient declined          Disposition: Case was staffed with Romilda Garret FNP who recommended patient be monitored and observed  for safety and will be seen by psychiatry in the a.m.     Disposition Initial Assessment Completed for this Encounter: Yes Disposition of Patient: (Observe and monitor) Patient refused recommended treatment: (UTA) Mode of transportation if patient is discharged?: (Unk)  On Site Evaluation by:   Reviewed with Physician:    Mamie Nick 12/13/2017 6:19 PM

## 2017-12-13 NOTE — BH Assessment (Signed)
[place] Assessment Progress Note   Case was staffed with Romilda Garret FNP who recommended patient be monitored and observed for safety and will be seen by psychiatry in the a.m.

## 2017-12-13 NOTE — ED Notes (Addendum)
Unable to get temp, pt will not let staff.

## 2017-12-13 NOTE — ED Notes (Signed)
Bed: WA20 Expected date:  Expected time:  Means of arrival:  Comments: Hold for triage 4

## 2017-12-13 NOTE — Telephone Encounter (Signed)
Per Dr. Burt Knack, informed patient's daughter that it is her choice - she can do whatever makes her most comfortable.  She was grateful for call back.

## 2017-12-13 NOTE — ED Notes (Signed)
Bed: [facility]4 Expected date:  Expected time:  Means of arrival:  Comments: 

## 2017-12-14 DIAGNOSIS — I10 Essential (primary) hypertension: Secondary | ICD-10-CM | POA: Diagnosis not present

## 2017-12-14 DIAGNOSIS — F0151 Vascular dementia with behavioral disturbance: Secondary | ICD-10-CM | POA: Diagnosis not present

## 2017-12-14 DIAGNOSIS — F01518 Vascular dementia, unspecified severity, with other behavioral disturbance: Secondary | ICD-10-CM | POA: Diagnosis present

## 2017-12-14 MED ORDER — OLANZAPINE 5 MG PO TBDP: 2.5000 mg | ORAL_TABLET | Freq: Every day | ORAL | Status: DC

## 2017-12-14 MED ORDER — CLONIDINE HCL 0.1 MG PO TABS: 0.1000 mg | ORAL_TABLET | Freq: Once | ORAL | Status: DC

## 2017-12-14 MED ORDER — STERILE WATER FOR INJECTION IJ SOLN
INTRAMUSCULAR | Status: AC
  Administered 2017-12-14: 1.5 mL
  Filled 2017-12-14: qty 10

## 2017-12-14 MED ORDER — ZIPRASIDONE MESYLATE 20 MG IM SOLR
20.0000 mg | INTRAMUSCULAR | Status: AC | PRN
  Administered 2017-12-14: 20 mg via INTRAMUSCULAR
  Filled 2017-12-14: qty 20

## 2017-12-14 MED ORDER — QUETIAPINE FUMARATE 25 MG PO TABS
25.0000 mg | ORAL_TABLET | Freq: Two times a day (BID) | ORAL | Status: DC
  Filled 2017-12-14: qty 1

## 2017-12-14 MED ORDER — CLOPIDOGREL BISULFATE 75 MG PO TABS
75.0000 mg | ORAL_TABLET | Freq: Every day | ORAL | Status: DC
  Administered 2017-12-15: 75 mg via ORAL
  Filled 2017-12-14 (×2): qty 1

## 2017-12-14 MED ORDER — CLONIDINE HCL 0.3 MG/24HR TD PTWK
0.3000 mg | MEDICATED_PATCH | TRANSDERMAL | Status: DC
  Filled 2017-12-14: qty 1

## 2017-12-14 MED ORDER — SUCRALFATE 1 GM/10ML PO SUSP
1.0000 g | Freq: Three times a day (TID) | ORAL | Status: DC
  Filled 2017-12-14 (×7): qty 10

## 2017-12-14 MED ORDER — ZIPRASIDONE MESYLATE 20 MG IM SOLR
10.0000 mg | Freq: Once | INTRAMUSCULAR | Status: AC
  Administered 2017-12-14: 10 mg via INTRAMUSCULAR
  Filled 2017-12-14: qty 20

## 2017-12-14 MED ORDER — ONDANSETRON HCL 4 MG PO TABS: 4.0000 mg | ORAL_TABLET | Freq: Three times a day (TID) | ORAL | Status: DC | PRN

## 2017-12-14 MED ORDER — DIVALPROEX SODIUM ER 250 MG PO TB24
250.0000 mg | ORAL_TABLET | Freq: Every day | ORAL | Status: DC
  Administered 2017-12-14 – 2017-12-15 (×2): 250 mg via ORAL
  Filled 2017-12-14 (×3): qty 1

## 2017-12-14 MED ORDER — LORAZEPAM 1 MG PO TABS
1.0000 mg | ORAL_TABLET | ORAL | Status: AC | PRN
  Administered 2017-12-14: 1 mg via ORAL
  Filled 2017-12-14 (×2): qty 1

## 2017-12-14 MED ORDER — HALOPERIDOL LACTATE 5 MG/ML IJ SOLN
5.0000 mg | Freq: Once | INTRAMUSCULAR | Status: AC
  Administered 2017-12-14: 5 mg via INTRAVENOUS
  Filled 2017-12-14: qty 1

## 2017-12-14 MED ORDER — STERILE WATER FOR INJECTION IJ SOLN
INTRAMUSCULAR | Status: AC
  Filled 2017-12-14: qty 10

## 2017-12-14 MED ORDER — CLONIDINE ORAL SUSPENSION 10 MCG/ML
0.1000 mg | Freq: Once | ORAL | Status: DC
  Filled 2017-12-14: qty 10

## 2017-12-14 MED ORDER — AMLODIPINE BESYLATE 5 MG PO TABS
5.0000 mg | ORAL_TABLET | Freq: Every day | ORAL | Status: DC
  Administered 2017-12-14: 5 mg via ORAL
  Filled 2017-12-14 (×2): qty 1

## 2017-12-14 MED ORDER — NEBIVOLOL HCL 2.5 MG PO TABS
2.5000 mg | ORAL_TABLET | Freq: Two times a day (BID) | ORAL | Status: DC
  Administered 2017-12-14 – 2017-12-15 (×3): 2.5 mg via ORAL
  Filled 2017-12-14 (×5): qty 1

## 2017-12-14 MED ORDER — SERTRALINE HCL 50 MG PO TABS
50.0000 mg | ORAL_TABLET | Freq: Every day | ORAL | Status: DC
  Administered 2017-12-14 – 2017-12-15 (×2): 50 mg via ORAL
  Filled 2017-12-14 (×3): qty 1

## 2017-12-14 MED ORDER — ZOLPIDEM TARTRATE 5 MG PO TABS
5.0000 mg | ORAL_TABLET | Freq: Every evening | ORAL | Status: DC | PRN
  Administered 2017-12-14: 5 mg via ORAL
  Filled 2017-12-14: qty 1

## 2017-12-14 MED ORDER — RISPERIDONE 1 MG PO TBDP: 2.0000 mg | ORAL_TABLET | Freq: Three times a day (TID) | ORAL | Status: DC | PRN

## 2017-12-14 MED ORDER — LABETALOL HCL 5 MG/ML IV SOLN: 10.0000 mg | Freq: Once | INTRAVENOUS | Status: DC

## 2017-12-14 MED ORDER — CLONIDINE HCL 0.2 MG/24HR TD PTWK
0.2000 mg | MEDICATED_PATCH | TRANSDERMAL | Status: DC
  Administered 2017-12-14: 0.2 mg via TRANSDERMAL
  Filled 2017-12-14: qty 1

## 2017-12-14 NOTE — BH Assessment (Addendum)
[facility] Assessment Progress Note  Per Buford Dresser, DO, this pt requires psychiatric hospitalization at this time.  Dr Mariea Clonts also finds that pt meets criteria for IVC, which she has initiated.  IVC documents have been faxed to [facility], and at [facility] confirms receipt.  He has since faxed Findings and Custody Order to this Probation officer.  At 13:15 I called [employer] and spoke to Company secretary, who took demographic information, agreeing to dispatch law enforcement to fill out Return of Service.  Law enforcement then presented at [facility], completing Return of Service.  This Probation officer will seek placement for this pt.   Jalene Mullet, [place] [phone]   Addendum:  The following facilities have been contacted, with results as noted:  Beds available, information sent, decision pending:  [place]   At capacity:  [place], [place] Coordinator [phone]
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
## 2017-12-14 NOTE — ED Provider Notes (Signed)
Patient continues to be combative and refusing medications.  Her blood pressure is creeping up and she has refused oral medications.  I have consulted medicine team to come and see the patient, perhaps they will recommend antihypertensive in a patch form for the patient.   Varney Biles, MD 12/14/17 1556

## 2017-12-14 NOTE — Consult Note (Signed)
[facility] Medical Consultation  [name] [name] [id number]:[id number] DOB: 21-Jun-1924 DOA: 12/13/2017 PCP: Darcus Austin, MD   Requesting physician: Dr. Kathrynn Humble ED Date of consultation: 12/14/2017 Reason for consultation: HTN  HPI:  84 year old female with prior CVAs, hypertension, advanced dementia, coronary artery disease, who is being admitted for behavioral disturbances as well as aggression and currently is hospitalized in the psych hold in the emergency room.  She has been refusing her oral medications and she has persistently high blood pressure and we are asked to consult and help with blood pressure management.  Patient has underlying dementia, mumbling and appears quite restless on my evaluation and she is unable to contribute anything to the story.  Review of Systems:  Unable to obtain review of systems due to underlying dementia   Impression/Recommendations Principal Problem:   Vascular dementia with behavior disturbance  1. Hypertension -discussed with the nurse at bedside, she occasionally does take her oral meds if the question source.  Will place on clonidine patch, will provide clonidine liquid formulation x1 currently his blood pressures in the 448 systolic. 2. Dementia with aggressive behavior -psych following, recommended inpatient geropsychiatric hospitalization. Continue meds as able per psych  I will followup again tomorrow. Please contact me if I can be of assistance in the meanwhile. Thank you for this consultation.   Past Medical History:  Diagnosis Date  . Arthritis    "thumbs, joints" (03/23/2017  . Basal cell carcinoma    "several; scattered over my face, hands, leg some cut off; some burned off"  . Blind left eye    a. Corneal transplant x3 with rejection  . CAD (coronary artery disease)    a. s/p NSTEMI 2015 treated conservatively; nuclear stress test low risk. b. Continued angina despite med rx 07/2015 - s/p Bio Freedom Stent to ramus intermedius with  mod LAD stenosis neg by FFR. // Nuc study 5/19:  EF 67, no ischemia or scar; Low Risk   . Complication of anesthesia    "very sensitive to RX"  . Diverticulitis large intestine   . Diverticulosis   . Family history of adverse reaction to anesthesia    "we all get PONV" (1/1/201)  . Gastritis   . GERD (gastroesophageal reflux disease)   . Hayfever   . Helicobacter pylori (H. pylori) 05/22/02   RUT-Positive  . Hypertension   . Myocardial infarction ([place]) 2015  . Squamous carcinoma    "several; scattered over my face, hands, leg some cut off; some burned off"  . Stroke [facility]) 2015   denies residual on 08/07/2015  . Stroke [facility]) 03/18/2017   "has effected her vision, balance, some memory" (03/23/2017)  . SVT (supraventricular tachycardia) (H[facility])    a. seen by Dr. Caryl Comes - has loop recorder in. Patient has declined amiodarone due to side effect profile  . Varicose veins    Past Surgical History:  Procedure Laterality Date  . BASAL CELL CARCINOMA EXCISION     "several; scattered over my face, hands, leg some cut off; some burned off"  . CARDIAC CATHETERIZATION N/A 08/07/2015   Procedure: Left Heart Cath and Coronary Angiography;  Surgeon: [name], MD;  Location: [place] CV LAB;  Service: Cardiovascular;  Laterality: N/A;  . CARDIAC CATHETERIZATION N/A 08/07/2015   Procedure: Coronary Stent Intervention;  Surgeon: [name], MD;  Location: [place] CV LAB;  Service: Cardiovascular;  Laterality: N/A;  . CARDIAC CATHETERIZATION N/A 08/07/2015   Procedure: Intravascular Pressure Wire/FFR Study;  Surgeon: [name], MD;  Location: [place] CV LAB;  Service: Cardiovascular;  Laterality: N/A;  . CATARACT EXTRACTION W/ INTRAOCULAR LENS  IMPLANT, BILATERAL Bilateral 2005  . CLOSED REDUCTION SHOULDER DISLOCATION Left 2016 X 2  . CORNEAL TRANSPLANT Bilateral right 2009, left 2009    3 in left eye (last 2 failed), 1 in right eye  . DILATION AND CURETTAGE OF UTERUS    . EYE  SURGERY    . JOINT REPLACEMENT    . [place]   "had gangrene in it"  . LOOP RECORDER IMPLANT N/A 01/05/2014   Procedure: LOOP RECORDER IMPLANT;  Surgeon: Coralyn Mark, MD;  Location: [place] CATH LAB;  Service: Cardiovascular;  Laterality: N/A;  . SQUAMOUS CELL CARCINOMA EXCISION     "several; scattered over my face, hands, leg some cut off; some burned off"  . TONSILLECTOMY    . TOTAL ABDOMINAL HYSTERECTOMY  11/1983   "ovaries and all"  . TOTAL KNEE ARTHROPLASTY Left 06/27/2012   Procedure: LEFT TOTAL KNEE ARTHROPLASTY;  Surgeon: Gearlean Alf, MD;  Location: [facility];  Service: Orthopedics;  Laterality: Left;  . TOTAL KNEE ARTHROPLASTY Right 12/12/2012   Procedure: RIGHT TOTAL KNEE ARTHROPLASTY;  Surgeon: Gearlean Alf, MD;  Location: [facility];  Service: Orthopedics;  Laterality: Right;  . TUBAL LIGATION  1966   Social History:  reports that she has never smoked. She has never used smokeless tobacco. She reports that she does not drink alcohol or use drugs.  Allergies  Allergen Reactions  . Fluorescein Anaphylaxis, Shortness Of Breath, Itching and Swelling  . Hydralazine Hcl Anaphylaxis, Swelling, Palpitations and Rash  . Sulfa Antibiotics Rash and Other (See Comments)    Other Reaction: red bumps  . Ultram [Tramadol] Hives  . Yellow Dye Anaphylaxis, Itching, Swelling and Other (See Comments)    Fluorescein (in eye drops)  . Buprenex [Buprenorphine Hcl] Palpitations and Other (See Comments)  . Keflex [Cephalexin] Diarrhea  . Lipitor [Atorvastatin] Other (See Comments)    Muscle weakness  . Restasis [Cyclosporine] Swelling    redness of face with slight swelling   . Augmentin [Amoxicillin-Pot Clavulanate] Nausea And Vomiting  . Levofloxacin Other (See Comments)    Reaction not recalled  . Morphine And Related Other (See Comments)    agitation   . Percocet [Oxycodone-Acetaminophen] Nausea And Vomiting  . Tape Other (See Comments)    Adhesive tape pulls off  the skin Able to tolerate paper tape  . Zetia [Ezetimibe] Other (See Comments)    Myalgias   Family History  Problem Relation Age of Onset  . Stroke Mother   . Heart attack Father   . Heart attack Brother   . Cancer Brother   . Heart attack Brother   . Heart attack Brother   . Heart attack Brother   . Parkinsonism Brother     Prior to Admission medications   Medication Sig Start Date End Date Taking? Authorizing Provider  ALPRAZolam (XANAX) 0.5 MG tablet Take 0.25-0.5 mg by mouth every 8 (eight) hours as needed. 12/10/17  Yes [provider]  amLODipine (NORVASC) 5 MG tablet Take 1 tablet (5 mg total) by mouth daily. 12/01/17 11/26/18 Yes [name], MD  clopidogrel (PLAVIX) 75 MG tablet take 1 tablet by mouth once daily 12/10/14  Yes [provider]  divalproex (DEPAKOTE ER) 250 MG 24 hr tablet Please give 2 tablets (500mg ) in the morning and 1 tablet (250mg ) in the evening 12/06/17  Yes Venancio Poisson, NP  Multiple Vitamins-Minerals (  PRESERVISION AREDS PO) Take 1 tablet by mouth daily.   Yes [provider]  nebivolol (BYSTOLIC) 2.5 MG tablet Take 1 tablet (2.5 mg total) by mouth 2 (two) times daily. Patient taking differently: Take 2.5 mg by mouth 3 (three) times daily.  08/10/17  Yes Weaver, Scott T, PA-C  pantoprazole (PROTONIX) 40 MG tablet Take 1 tablet (40 mg total) by mouth 2 (two) times daily before a meal. 11/18/17  Yes Zehr, Janett Billow D, PA-C  prednisoLONE acetate (PRED FORTE) 1 % ophthalmic suspension Place 1 drop into both eyes See admin instructions. 1 drop into both eyes at bedtime spaced 5 minutes apart from Systane gel   Yes [provider]  QUEtiapine (SEROQUEL) 25 MG tablet 1 tab during the day as needed and 1 tab nightly Patient taking differently: Take 25-50 mg by mouth 2 (two) times daily. 1 tab during the day as needed and 1 tab nightly 12/10/17  Yes Vanschaick, Janett Billow, NP  sertraline (ZOLOFT) 50 MG tablet Take 50 mg by mouth  daily.  10/28/17  Yes [provider]  sucralfate (CARAFATE) 1 GM/10ML suspension Take 10 mLs (1 g total) by mouth 4 (four) times daily -  with meals and at bedtime. 10/18/17  Yes Hayden Rasmussen, MD  nitroGLYCERIN (NITROSTAT) 0.4 MG SL tablet Place 1 tablet (0.4 mg total) under the tongue every 5 (five) minutes as needed for chest pain. 08/08/15   Consuelo Pandy, PA-C   Physical Exam: Blood pressure (!) 202/108, pulse 83, temperature 98 F (36.7 C), temperature source Axillary, resp. rate 20, SpO2 93 %. Vitals:   12/14/17 0745 12/14/17 0800  BP:  (!) 202/108  Pulse: 86 83  Resp:    Temp:    SpO2: 95% 93%     General:  agitated  Cardiovascular: hits my hand and stethoscope and refuses exam  Labs on Admission:  Basic Metabolic Panel: Recent Labs  Lab 12/13/17 1326  NA 141  K 4.3  CL 102  CO2 29  GLUCOSE 99  BUN 16  CREATININE 0.57  CALCIUM 9.2   Liver Function Tests: Recent Labs  Lab 12/13/17 1326  AST 28  ALT 20  ALKPHOS 83  BILITOT 0.6  PROT 7.1  ALBUMIN 4.1   No results for input(s): LIPASE, AMYLASE in the last 168 hours. No results for input(s): AMMONIA in the last 168 hours. CBC: Recent Labs  Lab 12/13/17 1326  WBC 5.6  NEUTROABS 4.1  HGB 15.1*  HCT 46.4*  MCV 96.5  PLT 199   Cardiac Enzymes: No results for input(s): CKTOTAL, CKMB, CKMBINDEX, TROPONINI in the last 168 hours. BNP: Invalid input(s): POCBNP CBG: No results for input(s): GLUCAP in the last 168 hours.  Radiological Exams on Admission: Dg Chest 1 View  Result Date: 12/13/2017 CLINICAL DATA:  Altered mental status and aggressive behavior. EXAM: CHEST  1 VIEW COMPARISON:  Chest x-ray dated 10/18/2017. FINDINGS: Heart size and mediastinal contours appear stable. Loop recorder in place. Lungs are clear. No pleural effusion or pneumothorax seen. No acute or suspicious osseous finding. IMPRESSION: No active disease.  No evidence of pneumonia or pulmonary edema. Electronically  Signed   By: Franki Cabot M.D.   On: 12/13/2017 17:10   Ct Head Wo Contrast  Result Date: 12/13/2017 CLINICAL DATA:  Altered mental status. EXAM: CT HEAD WITHOUT CONTRAST TECHNIQUE: Contiguous axial images were obtained from the base of the skull through the vertex without intravenous contrast. COMPARISON:  09/21/2017 and 03/18/2017 FINDINGS: Brain: Ventricles and  cisterns as well as CSF spaces are within normal. Exam demonstrates evidence of patient's old right occipital infarct. There is also evidence of old left MCA infarct. Evidence of chronic ischemic microvascular disease. No mass, mass effect, shift of midline structures or acute hemorrhage. No evidence of acute infarction. Remainder of the exam is unchanged. Vascular: No hyperdense vessel or unexpected calcification. Skull: Normal. Negative for fracture or focal lesion. Sinuses/Orbits: No acute finding. Other: None. IMPRESSION: No acute intracranial findings. Old left MCA and right occipital infarct. Chronic ischemic microvascular disease. Electronically Signed   By: Marin Olp M.D.   On: 12/13/2017 16:06    [facility] Pager [phone]  If 7PM-7AM, please contact night-coverage [email or web address] Password [facility] 12/14/2017, 3:14 PM

## 2017-12-14 NOTE — ED Notes (Signed)
Daughter at bedside. Patient still aggressive with sitter and daughter.

## 2017-12-14 NOTE — ED Notes (Signed)
Patient getting increasingly aggressive and trying to get out of the bed. MD made aware. Patient refuses PO medication for agitation and blood pressure.

## 2017-12-15 DIAGNOSIS — I10 Essential (primary) hypertension: Secondary | ICD-10-CM | POA: Diagnosis not present

## 2017-12-15 DIAGNOSIS — F0151 Vascular dementia with behavioral disturbance: Secondary | ICD-10-CM | POA: Diagnosis not present

## 2017-12-15 MED ORDER — AMLODIPINE BESYLATE 5 MG PO TABS
10.0000 mg | ORAL_TABLET | Freq: Every day | ORAL | Status: DC
  Administered 2017-12-15: 10 mg via ORAL
  Filled 2017-12-15: qty 2

## 2017-12-15 MED ORDER — OLANZAPINE 5 MG PO TBDP: 2.5000 mg | ORAL_TABLET | Freq: Every day | ORAL | 0 refills | Status: DC

## 2017-12-15 MED ORDER — CLONIDINE 0.2 MG/24HR TD PTWK: 0.2000 mg | MEDICATED_PATCH | TRANSDERMAL | 0 refills | Status: DC

## 2017-12-15 MED ORDER — ENSURE ENLIVE PO LIQD
237.0000 mL | Freq: Once | ORAL | Status: AC
  Administered 2017-12-15: 237 mL via ORAL
  Filled 2017-12-15: qty 237

## 2017-12-15 MED ORDER — TRAZODONE HCL 50 MG PO TABS: 50.0000 mg | ORAL_TABLET | Freq: Every day | ORAL | Status: DC

## 2017-12-15 NOTE — [facility] Counselor (Signed)
12/15/17- Pt's daughter states she will pick the Pt at 1630.  Lorenza Cambridge, [facility] Triage Specialist

## 2017-12-15 NOTE — Discharge Instructions (Addendum)
For your behavioral health needs, you are advised to follow up with your regular outpatient providers.  If it is determined that you would benefit from seeing a psychiatrist, consider contacting one of the providers listed below to ask about scheduling an intake appointment:       Leanord Hawking, MD      [facility]      [address], [place], [place] [address]      [phone]       Norma Fredrickson, MD      [facility]      [address]      [place], [place] [address]      [phone]  A support group may be helpful to your family members in understanding dementia and the challenges that it presents.  To find a support group in your area, visit the following website:       [email or web address]

## 2017-12-15 NOTE — Consult Note (Addendum)
[facility] Psych ED Discharge  12/15/2017 11:13 AM [name] [name]  MRN:  [id number] Principal Problem: Vascular dementia with behavior disturbance Discharge Diagnoses:  Patient Active Problem List   Diagnosis Date Noted  . Vascular dementia with behavior disturbance [F01.51]   . Epigastric pain [R10.13] 11/18/2017  . Belching [R14.2] 11/18/2017  . Diastolic dysfunction [Y60.63]   . Hypokalemia [E87.6]   . Seizures ([place]) [R56.9]   . PAF (paroxysmal atrial fibrillation) ([place]) [I48.0]   . Cerebral embolism with cerebral infarction [I63.40] 09/21/2017  . Acute CVA (cerebrovascular accident) ([place]) [I63.9] 09/21/2017  . Blind [H54.7] 04/02/2017  . Visual field cut- right [H53.40] 04/02/2017  . Gastroesophageal reflux disease [K21.9]   . Pre-diabetes [R73.03]   . Blind left eye- (Fuch's disease) [H54.40] 03/05/2015  . Essential hypertension [I10] 12/19/2014  . CAD (coronary artery disease) [I25.10] 12/19/2014  . History of Rt brain stroke Oct 2015 [Z86.73] 06/14/2014  . Headache [R51] 06/14/2014  . Malignant hypertension [I10] 01/20/2014  . Aphasia [R47.01] 01/04/2014  . Paresthesia [R20.2] 01/04/2014  . Generalized abdominal pain [R10.84] 01/01/2014  . History of diverticulitis [Z87.19] 01/01/2014  . Diaphoresis [R61] 12/29/2012  . Sleep disturbance [G47.9] 10/03/2012  . Depression [F32.9] 07/12/2012  . OA (osteoarthritis) of knee [M17.10] 06/27/2012  . History of PSVT [Z86.79] 12/15/2011    Subjective: Pt was seen and chart reviewed with treatment team and Dr Mariea Clonts. Pt has been calm and cooperative in the emergency room. Her medications were adjusted and she has been medication compliant while in the ED with the exception of her blood pressure medication. She was refusing to take the BP pills so she was placed on a Clonodine patch 0.2 mg once weekly. Pt suffered a CVA in July 2019 and according to her family her behavior has been disorganized and combative. Pt is followed by a neurologist and  will be given resources for a psychiatrist she can see as an outpatient. Pt's UDS and BAL are negative, EKG reviewed and is WNL, urine does not show evidence of a UTI.  Pt is stable and psychiatrically clear for discharge.   Total Time spent with patient: 30 minutes  Past Psychiatric History: As above  Past Medical History:  Past Medical History:  Diagnosis Date  . Arthritis    "thumbs, joints" (03/23/2017  . Basal cell carcinoma    "several; scattered over my face, hands, leg some cut off; some burned off"  . Blind left eye    a. Corneal transplant x3 with rejection  . CAD (coronary artery disease)    a. s/p NSTEMI 2015 treated conservatively; nuclear stress test low risk. b. Continued angina despite med rx 07/2015 - s/p Bio Freedom Stent to ramus intermedius with mod LAD stenosis neg by FFR. // Nuc study 5/19:  EF 67, no ischemia or scar; Low Risk   . Complication of anesthesia    "very sensitive to RX"  . Diverticulitis large intestine   . Diverticulosis   . Family history of adverse reaction to anesthesia    "we all get PONV" (1/1/201)  . Gastritis   . GERD (gastroesophageal reflux disease)   . Hayfever   . Helicobacter pylori (H. pylori) 05/22/02   RUT-Positive  . Hypertension   . Myocardial infarction ([place]) 2015  . Squamous carcinoma    "several; scattered over my face, hands, leg some cut off; some burned off"  . Stroke [facility]) 2015   denies residual on 08/07/2015  . Stroke [facility]) 03/18/2017   "has effected her vision,  balance, some memory" (03/23/2017)  . SVT (supraventricular tachycardia) (HCC)    a. seen by Dr. Caryl Comes - has loop recorder in. Patient has declined amiodarone due to side effect profile  . Varicose veins    Past Surgical History:  Procedure Laterality Date  . BASAL CELL CARCINOMA EXCISION     "several; scattered over my face, hands, leg some cut off; some burned off"  . CARDIAC CATHETERIZATION N/A 08/07/2015   Procedure: Left Heart Cath and Coronary Angiography;   Surgeon: [name], MD;  Location: [place] CV LAB;  Service: Cardiovascular;  Laterality: N/A;  . CARDIAC CATHETERIZATION N/A 08/07/2015   Procedure: Coronary Stent Intervention;  Surgeon: [name], MD;  Location: [place] CV LAB;  Service: Cardiovascular;  Laterality: N/A;  . CARDIAC CATHETERIZATION N/A 08/07/2015   Procedure: Intravascular Pressure Wire/FFR Study;  Surgeon: [name], MD;  Location: [place] CV LAB;  Service: Cardiovascular;  Laterality: N/A;  . CATARACT EXTRACTION W/ INTRAOCULAR LENS  IMPLANT, BILATERAL Bilateral 2005  . CLOSED REDUCTION SHOULDER DISLOCATION Left 2016 X 2  . CORNEAL TRANSPLANT Bilateral right 2009, left 2009    3 in left eye (last 2 failed), 1 in right eye  . DILATION AND CURETTAGE OF UTERUS    . EYE SURGERY    . JOINT REPLACEMENT    . [place]   "had gangrene in it"  . LOOP RECORDER IMPLANT N/A 01/05/2014   Procedure: LOOP RECORDER IMPLANT;  Surgeon: Coralyn Mark, MD;  Location: [place] CATH LAB;  Service: Cardiovascular;  Laterality: N/A;  . SQUAMOUS CELL CARCINOMA EXCISION     "several; scattered over my face, hands, leg some cut off; some burned off"  . TONSILLECTOMY    . TOTAL ABDOMINAL HYSTERECTOMY  11/1983   "ovaries and all"  . TOTAL KNEE ARTHROPLASTY Left 06/27/2012   Procedure: LEFT TOTAL KNEE ARTHROPLASTY;  Surgeon: Gearlean Alf, MD;  Location: [facility];  Service: Orthopedics;  Laterality: Left;  . TOTAL KNEE ARTHROPLASTY Right 12/12/2012   Procedure: RIGHT TOTAL KNEE ARTHROPLASTY;  Surgeon: Gearlean Alf, MD;  Location: [facility];  Service: Orthopedics;  Laterality: Right;  . TUBAL LIGATION  1966   Family History:  Family History  Problem Relation Age of Onset  . Stroke Mother   . Heart attack Father   . Heart attack Brother   . Cancer Brother   . Heart attack Brother   . Heart attack Brother   . Heart attack Brother   . Parkinsonism Brother    Family Psychiatric  History: As stated  above.  Social History:  Social History   Substance and Sexual Activity  Alcohol Use No    Social History   Substance and Sexual Activity  Drug Use No   Social History   Socioeconomic History  . Marital status: Widowed    Spouse name: Not on file  . Number of children: 3  . Years of education: Some college  . Highest education level: Not on file  Occupational History  . Occupation: Retired  Scientific laboratory technician  . Financial resource strain: Not on file  . Food insecurity:    Worry: Not on file    Inability: Not on file  . Transportation needs:    Medical: Not on file    Non-medical: Not on file  Tobacco Use  . Smoking status: Never Smoker  . Smokeless tobacco: Never Used  Substance and Sexual Activity  . Alcohol use: No  . Drug use: No  .  Sexual activity: Never  Lifestyle  . Physical activity:    Days per week: Not on file    Minutes per session: Not on file  . Stress: Not on file  Relationships  . Social connections:    Talks on phone: Not on file    Gets together: Not on file    Attends religious service: Not on file    Active member of club or organization: Not on file    Attends meetings of clubs or organizations: Not on file    Relationship status: Not on file  Other Topics Concern  . Not on file  Social History Narrative   Patient is right handed   Patient lives alone.   Patient drinks 1 cup of coffee daily.    Has this patient used any form of tobacco in the last 30 days? (Cigarettes, Smokeless Tobacco, Cigars, and/or Pipes) Prescription not provided because: Patient does not use tobacco.  Current Medications: Current Facility-Administered Medications  Medication Dose Route Frequency Provider Last Rate Last Dose  . amLODipine (NORVASC) tablet 10 mg  10 mg Oral Daily Raiford Noble [place], DO   10 mg at 12/15/17 1017  . cloNIDine (CATAPRES - Dosed in mg/24 hr) patch 0.2 mg  0.2 mg Transdermal Weekly Caren Griffins, MD   0.2 mg at 12/14/17 1602  .  cloNIDine (CATAPRES) tablet 0.1 mg  0.1 mg Oral Once Caren Griffins, MD      . clopidogrel (PLAVIX) tablet 75 mg  75 mg Oral Daily Kathrynn Humble, Ankit, MD   75 mg at 12/15/17 0951  . divalproex (DEPAKOTE ER) 24 hr tablet 250 mg  250 mg Oral Daily Nanavati, Ankit, MD   250 mg at 12/15/17 0952  . nebivolol (BYSTOLIC) tablet 2.5 mg  2.5 mg Oral BID Varney Biles, MD   2.5 mg at 12/15/17 0950  . OLANZapine zydis (ZYPREXA) disintegrating tablet 2.5 mg  2.5 mg Oral QHS Buford Dresser J, DO      . ondansetron [facility]) tablet 4 mg  4 mg Oral Q8H PRN Kathrynn Humble, Ankit, MD      . sertraline (ZOLOFT) tablet 50 mg  50 mg Oral Daily Kathrynn Humble, Ankit, MD   50 mg at 12/15/17 0953  . sucralfate (CARAFATE) 1 GM/10ML suspension 1 g  1 g Oral TID WC & HS Nanavati, Ankit, MD      . traZODone (DESYREL) tablet 50 mg  50 mg Oral QHS Ethelene Hal, NP       Current Outpatient Medications  Medication Sig Dispense Refill  . ALPRAZolam (XANAX) 0.5 MG tablet Take 0.25-0.5 mg by mouth every 8 (eight) hours as needed.  1  . amLODipine (NORVASC) 5 MG tablet Take 1 tablet (5 mg total) by mouth daily. 90 tablet 3  . clopidogrel (PLAVIX) 75 MG tablet take 1 tablet by mouth once daily    . divalproex (DEPAKOTE ER) 250 MG 24 hr tablet Please give 2 tablets (500mg ) in the morning and 1 tablet (250mg ) in the evening 90 tablet 4  . Multiple Vitamins-Minerals (PRESERVISION AREDS PO) Take 1 tablet by mouth daily.    . nebivolol (BYSTOLIC) 2.5 MG tablet Take 1 tablet (2.5 mg total) by mouth 2 (two) times daily. (Patient taking differently: Take 2.5 mg by mouth 3 (three) times daily. ) 180 tablet 3  . pantoprazole (PROTONIX) 40 MG tablet Take 1 tablet (40 mg total) by mouth 2 (two) times daily before a meal. 60 tablet 3  . prednisoLONE acetate (PRED FORTE)  1 % ophthalmic suspension Place 1 drop into both eyes See admin instructions. 1 drop into both eyes at bedtime spaced 5 minutes apart from Systane gel    . QUEtiapine  (SEROQUEL) 25 MG tablet 1 tab during the day as needed and 1 tab nightly (Patient taking differently: Take 25-50 mg by mouth 2 (two) times daily. 1 tab during the day as needed and 1 tab nightly) 60 tablet 3  . sertraline (ZOLOFT) 50 MG tablet Take 50 mg by mouth daily.   1  . sucralfate (CARAFATE) 1 GM/10ML suspension Take 10 mLs (1 g total) by mouth 4 (four) times daily -  with meals and at bedtime. 420 mL 0  . nitroGLYCERIN (NITROSTAT) 0.4 MG SL tablet Place 1 tablet (0.4 mg total) under the tongue every 5 (five) minutes as needed for chest pain. 25 tablet 2    Musculoskeletal: Strength & Muscle Tone: within normal limits Gait & Station: normal Patient leans: N/A  Psychiatric Specialty Exam: Physical Exam  Nursing note and vitals reviewed. Constitutional: She appears well-developed.  HENT:  Head: Normocephalic and atraumatic.  Neck: Normal range of motion.  Neurological: She is alert.  Oriented to person.  Psychiatric: Her speech is normal. Judgment and thought content normal. She is withdrawn. Cognition and memory are impaired. She exhibits a depressed mood.    Review of Systems  Psychiatric/Behavioral: Positive for depression and memory loss.  All other systems reviewed and are negative.   Blood pressure (!) 183/89, pulse 78, temperature 98.4 F (36.9 C), temperature source Oral, resp. rate 19, SpO2 93 %.There is no height or weight on file to calculate BMI.  General Appearance: Casual  Eye Contact:  Minimal  Speech:  Slow  Volume:  Decreased  Mood:  Depressed  Affect:  Congruent  Thought Process:  Disorganized  Orientation:  Other:  UTA Pt has dementia  Thought Content:  UTA Pt has dementia  Suicidal Thoughts:  UTA Pt has dementia  Homicidal Thoughts:  UTA Pt has dementia  Memory:  Immediate;   UTA Pt has dementia Recent;   UTA Pt has dementia Remote;   UTA Pt has dementia  Judgement:  Other:  UTA Pt has dementia  Insight:  UTA Pt has dementia  Psychomotor Activity:   Decreased  Concentration:  Concentration: Poor and Attention Span: Poor  Recall:  UTA Pt has dementia  Fund of Knowledge:  UTA Pt has dementia  Language:  UTA Pt has dementia  Akathisia:  No  Handed:  Right  AIMS (if indicated):     Assets:  Catering manager Housing  ADL's:  Impaired  Cognition:  Impaired,  Severe  Sleep:   Fair     Demographic Factors:  Age 43 or older and Caucasian  Loss Factors: Decline in physical health  Historical Factors: Impulsivity  Risk Reduction Factors:   Living with another person, especially a relative  Continued Clinical Symptoms:  Depression:   Impulsivity  Cognitive Features That Contribute To Risk:  Loss of executive function    Suicide Risk:  Minimal: No identifiable suicidal ideation.  Patients presenting with no risk factors but with morbid ruminations; may be classified as minimal risk based on the severity of the depressive symptoms    Plan Of Care/Follow-up recommendations:  Activity:  as tolerated Diet:  Heart healthy  Disposition: Take all medications as prescribed. Your medications were changed in the emergency room: -Zyprexa 2.5 mg at bedtime for mood control -Clonidine patch 0.2 mg once weekly transdermal for  blood pressure control Keep all follow-up appointments as scheduled.  Do not consume alcohol or use illegal drugs while on prescription medications. Report any adverse effects from your medications to your primary care provider promptly.  In the event of recurrent symptoms or worsening symptoms, call 911, a crisis hotline, or go to the nearest emergency department for evaluation.   Ethelene Hal, NP 12/15/2017, 11:13 AM   Patient seen face-to-face for psychiatric evaluation, chart reviewed and case discussed with the physician extender and developed treatment plan. Reviewed the information documented and agree with the treatment plan.  Buford Dresser, DO 12/15/17 10:34 PM

## 2017-12-15 NOTE — Progress Notes (Addendum)
CSW aware patient has been psychiatrically and medically cleared for discharge. CSW spoke to daughter, [name] [name] [phone], regarding patient disposition. Patient's daughter expressed concerns with patient being discharged. Per daughter, she would like patient to be sent to [facility] for further stabilization. CSW explained that psychiatry team feels patient no longer meets admission criteria, as patient is more pleasant and alert today and is not a danger to herself or others. CSW informed daughter that we could look into getting patient set up with home health services and could provide resources for ALF/Memory Care units.   Per daughter, patient's neurologist made changes to medications and she is still having behaviors. CSW explained that she could express daughter's concerns to psych team but that there was no guarantee that their recommendation would change. CSW expressed concerns to psych team who stated that patient has improved and that patient remains psychiatrically cleared. Per psych team, patient's behaviors are due to her vascular dementia.   TTS, Brandy, reached out to family and reiterated that patient was psychiatrically cleared and that the recommendation was to follow up with outpatient resources. Per [name] [name], patient's daughter stated she would not pick up patient. [name] [name] explained that if daughter refuses to pick up patient, APS would have to be called. Per daughter, she would follow up after she has reached out to her brother and sister.  1:48pm- CSW informed by TTS that patient's daughter would be by to pick up patient at 4:30pm. CSW to leave handoff for 2nd shift CSW.  [name] [name], [place] Work Department  [employer]  [phone]

## 2017-12-15 NOTE — BH Assessment (Addendum)
[facility] Assessment Progress Note  Per Buford Dresser, DO, this pt does not require psychiatric hospitalization at this time.  Pt presents under IVC initiated by Dr Mariea Clonts, which she has now rescinded.  Pt it to be discharged from [facility] with recommendation to continue treatment with her regular outpatient providers.  This has been included in pt's discharge instructions.  Ollen Barges, LCSW agrees to facilitate pt's return to the community.  Pt's nurse, Geni Bers, has been notified.  Jalene Mullet, MA Triage Specialist [phone]   Addendum:  At the request of pt's family, referral information for area psychiatrists has been included in pt's discharge instructions.  Pt's nurse, Geni Bers, has been notified.  Jalene Mullet, [place] Coordinator [phone]

## 2017-12-15 NOTE — Progress Notes (Signed)
PROGRESS NOTE    [name] [name]  OYD:[id number] DOB: Aug 15, 1924 DOA: 12/13/2017 PCP: Darcus Austin, MD   Brief Narrative:  The patient is a 84 year old female with prior CVAs, hypertension, advanced dementia, coronary artery disease, who is being admitted for behavioral disturbances as well as aggression and currently is hospitalized in the psych hold in the emergency room.  She has been refusing her oral medications and she has persistently high blood pressure and we are asked to consult and help with blood pressure management. Patient has underlying dementia and history is severely limited and unable to be obtained from the patient.  On my evaluation today patient was somnolent and did not interact however just prior she was agitated and trying to get out of bed.  **TRH will sign off the case and please do not hesitate to call us back if any other questions or issues arise that we can assist with.   Assessment & Plan:   Principal Problem:   Vascular dementia with behavior disturbance  Hypertension  -Discussed with the nurse at bedside and was able to take her po medications crushed in apple sauce with family urging -Increased Amlodipine to 10 mg p.o. Daily -Continue with Nebivolol 2.5 mg p.o. twice daily -Started clonidine patch 0.2 mg transdermally over 7 days; was given clonidine 0.1 mg p.o. once yesterday afternoon -Continue monitor blood pressure per protocol -Blood pressure is improved since increasing Amlodipine and addition of Clonidine  Vascular Dementia with Aggressive Behavior  -Psych following, recommended inpatient geropsychiatric hospitalization.  -Continue meds as able per psych; she is currently on divalproex 250 mils p.o. daily, olanzapine 2.5 mill grams p.o. nightly, sertraline 50 mg p.o. daily -Patient received haloperidol lactate 5 mg IV once yesterday  History of CVA -Continue with Clopidogrel 75 mg p.o. daily  Sleep Disturbances -Continue with Trazodone 50 mg  p.o. nightly  DVT prophylaxis: Per Primary Code Status: FULL CODE Family Communication: No family present at bedside Disposition Plan: Per Primary  Consultants:   [facility] (Primary)   Procedures: None   Antimicrobials:  Anti-infectives (From admission, onward)   None     Subjective: Seen and examined at bedside and patient would not provide a subjective history.  On examination she was somnolent and would open her eyes but did not speak to me.  Nursing states that she was very agitated earlier this morning and had just calm down.  She was able to take her medications by mouth (crushed in apple sauce) this morning at the direction of her family.  Blood pressure is improved currently.  Objective: Vitals:   12/14/17 1515 12/14/17 1755 12/15/17 0648 12/15/17 1133  BP: (!) 172/96 (!) 156/74 (!) 183/89 130/73  Pulse: 89 79 78 91  Resp: 20 20 19 18   Temp:   98.4 F (36.9 C) 98.5 F (36.9 C)  TempSrc:   Oral   SpO2: 94% 94% 93% 92%   No intake or output data in the 24 hours ending 12/15/17 1249 There were no vitals filed for this visit.  Examination: Physical Exam:  Constitutional: WN/WD elderly in NAD and appears calm and comfortable and somnolent. History severely limited due to Dementia.  Eyes: Lids and conjunctivae normal, sclerae anicteric  ENMT: External Ears, Nose appear normal. Neck: Appears normal, supple, no visible cervical masses, normal ROM, no appreciable thyromegaly; no JVD Respiratory: Diminished to auscultation bilaterally, no wheezing, rales, rhonchi or crackles. Unlabored Breathing  Cardiovascular: RRR; Has a 2/6 Murmur. S1 and S2 auscultated. No appreciable extremity  edema.  Abdomen: Soft, non-tender, non-distended. No masses palpated. No appreciable hepatosplenomegaly. Bowel sounds positive x4.  GU: Deferred. Musculoskeletal: No clubbing / cyanosis of digits/nails. No joint deformity upper and lower extremities Skin: No rashes, lesions, ulcers  on a limited skin eval. No induration; Warm and dry.  Neurologic: Somnolent and only opens eyes to examination. Does not interact but per nursing she has done this and has behavioral issues.   Psychiatric: Impaired judgment and insight. Unable to properly assess mood given that she was sleeping but was combative and agitated yesterday.    Data Reviewed: I have personally reviewed following labs and imaging studies  CBC: Recent Labs  Lab 12/13/17 1326  WBC 5.6  NEUTROABS 4.1  HGB 15.1*  HCT 46.4*  MCV 96.5  PLT 626   Basic Metabolic Panel: Recent Labs  Lab 12/13/17 1326  NA 141  K 4.3  CL 102  CO2 29  GLUCOSE 99  BUN 16  CREATININE 0.57  CALCIUM 9.2   GFR: Estimated Creatinine Clearance: 45.2 mL/min (by C-G formula based on SCr of 0.57 mg/dL). Liver Function Tests: Recent Labs  Lab 12/13/17 1326  AST 28  ALT 20  ALKPHOS 83  BILITOT 0.6  PROT 7.1  ALBUMIN 4.1   No results for input(s): LIPASE, AMYLASE in the last 168 hours. No results for input(s): AMMONIA in the last 168 hours. Coagulation Profile: No results for input(s): INR, PROTIME in the last 168 hours. Cardiac Enzymes: No results for input(s): CKTOTAL, CKMB, CKMBINDEX, TROPONINI in the last 168 hours. BNP (last 3 results) No results for input(s): PROBNP in the last 8760 hours. HbA1C: No results for input(s): HGBA1C in the last 72 hours. CBG: No results for input(s): GLUCAP in the last 168 hours. Lipid Profile: No results for input(s): CHOL, HDL, LDLCALC, TRIG, CHOLHDL, LDLDIRECT in the last 72 hours. Thyroid Function Tests: No results for input(s): TSH, T4TOTAL, FREET4, T3FREE, THYROIDAB in the last 72 hours. Anemia Panel: No results for input(s): VITAMINB12, FOLATE, FERRITIN, TIBC, IRON, RETICCTPCT in the last 72 hours. Sepsis Labs: No results for input(s): PROCALCITON, LATICACIDVEN in the last 168 hours.  No results found for this or any previous visit (from the past 240 hour(s)).   Radiology  Studies: Dg Chest 1 View  Result Date: 12/13/2017 CLINICAL DATA:  Altered mental status and aggressive behavior. EXAM: CHEST  1 VIEW COMPARISON:  Chest x-ray dated 10/18/2017. FINDINGS: Heart size and mediastinal contours appear stable. Loop recorder in place. Lungs are clear. No pleural effusion or pneumothorax seen. No acute or suspicious osseous finding. IMPRESSION: No active disease.  No evidence of pneumonia or pulmonary edema. Electronically Signed   By: Franki Cabot M.D.   On: 12/13/2017 17:10   Ct Head Wo Contrast  Result Date: 12/13/2017 CLINICAL DATA:  Altered mental status. EXAM: CT HEAD WITHOUT CONTRAST TECHNIQUE: Contiguous axial images were obtained from the base of the skull through the vertex without intravenous contrast. COMPARISON:  09/21/2017 and 03/18/2017 FINDINGS: Brain: Ventricles and cisterns as well as CSF spaces are within normal. Exam demonstrates evidence of patient's old right occipital infarct. There is also evidence of old left MCA infarct. Evidence of chronic ischemic microvascular disease. No mass, mass effect, shift of midline structures or acute hemorrhage. No evidence of acute infarction. Remainder of the exam is unchanged. Vascular: No hyperdense vessel or unexpected calcification. Skull: Normal. Negative for fracture or focal lesion. Sinuses/Orbits: No acute finding. Other: None. IMPRESSION: No acute intracranial findings. Old left MCA and right  occipital infarct. Chronic ischemic microvascular disease. Electronically Signed   By: Marin Olp M.D.   On: 12/13/2017 16:06   Scheduled Meds: . amLODipine  10 mg Oral Daily  . cloNIDine  0.2 mg Transdermal Weekly  . cloNIDine  0.1 mg Oral Once  . clopidogrel  75 mg Oral Daily  . divalproex  250 mg Oral Daily  . nebivolol  2.5 mg Oral BID  . OLANZapine zydis  2.5 mg Oral QHS  . sertraline  50 mg Oral Daily  . sucralfate  1 g Oral TID WC & HS  . traZODone  50 mg Oral QHS   Continuous Infusions:   LOS: 0 days    Kerney Elbe, DO [facility] PAGER is on AMION  If 7PM-7AM, please contact night-coverage [email or web address] Password [email or web address] 12/15/2017, 12:49 PM

## 2017-12-16 ENCOUNTER — Telehealth: Payer: Self-pay | Admitting: Neurology

## 2017-12-16 NOTE — Telephone Encounter (Signed)
Talk with Dr. Inda Merlin.  The patient has had significant cerebrovascular disease, she is now severely aphasic and agitated.  The patient has been placed on Zyprexa and alprazolam.  They are moving forward to trying to get her to an extended care facility.  The daughter is a current caretaker and she is having difficulty managing her, she will get agitated in the middle of the night.  I think that the diagnosis of a vascular dementia is reasonable at this time.  The patient will need an FL to form to get the transition to an extended care facility.

## 2017-12-22 NOTE — Telephone Encounter (Signed)
Pts daughter [name] requesting a call, stating the pt has been taking medication as prescribed but is still very difficult to control. Requesting a call to discuss further

## 2017-12-23 ENCOUNTER — Telehealth: Payer: Self-pay | Admitting: Adult Health

## 2017-12-23 NOTE — Telephone Encounter (Signed)
I have called and left message [facility] telephone [phone] -- fax [phone] asking them to call me back to see if if can get patient's apt moved up to an asap.

## 2017-12-24 NOTE — Telephone Encounter (Signed)
I called and spoke with [name] [name] and made her aware. She stated that the hospital stopped her seroquel and started her on Olanzapine 5mg  1/2 tablet at night, but she is wondering if she could also give her 1/2 tablet in the AM? She also mentioned that she has spoken with a few memory care facilities and has been told that the patient would not be able to stay there if she is aggressive and has behavioral issues. She is asking if we can please try to get her referred elsewhere to try and speed things along?

## 2017-12-24 NOTE — Telephone Encounter (Signed)
Cox, Dana R 21 hours ago (9:25 AM)      I have called and left message [facility] telephone [phone] -- fax [phone] asking them to call me back to see if if can get patient's apt moved up to an asap.       Documentation      Please notify daughter that we have attempted to reach out to provider in order to schedule appointment with psychiatry. I would not recommend changing any medications at this time as they were recently changed during hospital admission. If she continues to have behaviors of acting out or agressiveness, I would recommend that she returns to ED in order to ensure safety to herself and others. If we continue to be unable to reach psychiatry, I will place referral to a different psychiatrist. Daughter can also attempt to call office to schedule appointment at [phone]. Thank you.

## 2017-12-24 NOTE — Telephone Encounter (Signed)
Attempted to [facility] in order to have appointment scheduled.  Office personnel stated that daughter needs to call registration office in order to obtain further information such as insurance, current address and phone number prior to patient being able to be scheduled.  Registration number was provided by receptionist and given to Belinda Block, CMA in order to provide to daughter to call.  Receptionist did state that they are scheduling appointments starting in November and unsure if any other office will be able to see patient within 4-week.  As olanzapine was started during hospital admission, at this time I would not recommend any change in this medication and wait until she is able to be seen by psychiatry.  Please advise daughter that if patient is unable to receive appointment within 4 to 6-week period, to call office and we will attempt to look into any other offices are able to see her sooner.  I could also place referral to social work for further assistance regarding placement of patient at appropriate facility.

## 2017-12-24 NOTE — Telephone Encounter (Signed)
I spoke with [name] and advised her of Jessica's recommendations and provided her with the number to call, [phone]. She states that her son is getting married in 2 weeks and she is desperate for something that will help keep her mother calm that day so that she doesn't have another caregiver quit. She is declining social work at this time because she mentioned again that the places she called or visited all told her that the behavioral issues must be resolved in order for her to stay there. I tried explaining to her that we just don't feel comfortable making any more changes to psych meds, in fear of making things worse. She asked if I could please ask Janett Billow again. She may need to hear it from you directly. She can be reached at [phone].

## 2017-12-27 ENCOUNTER — Other Ambulatory Visit: Payer: Self-pay | Admitting: Adult Health

## 2017-12-27 DIAGNOSIS — F0151 Vascular dementia with behavioral disturbance: Secondary | ICD-10-CM

## 2017-12-27 DIAGNOSIS — F01518 Vascular dementia, unspecified severity, with other behavioral disturbance: Secondary | ICD-10-CM

## 2017-12-27 NOTE — Telephone Encounter (Signed)
Noted. Thank you for the assistance with Ms. Steger.

## 2017-12-27 NOTE — Telephone Encounter (Signed)
I  Have called and spoke to daughter [name] [phone] I  had a long conversation .  I spent several days trying to find a placement for patient.   Daughter is going to take patient to [facility] in [place] today.  [place]   Also Advised daughter she needs to return Dr. Eino Farber office call because they have tried to call her. [phone] .   Relayed to Daughter to Janett Billow Is placing a Referral for social work for her Mother which would be really good for her mother because they can help with resources .   Relayed she really needs needs to get her mother in with Phychiatric because they would be able to help with those medications . Jessica can't . Patient' daughter understood all details and she was very grateful.  She will call me back if she needs to.

## 2017-12-29 ENCOUNTER — Other Ambulatory Visit: Payer: Self-pay

## 2017-12-29 ENCOUNTER — Other Ambulatory Visit: Payer: Self-pay | Admitting: *Deleted

## 2017-12-29 ENCOUNTER — Emergency Department (HOSPITAL_COMMUNITY): Payer: PPO

## 2017-12-29 ENCOUNTER — Encounter (HOSPITAL_COMMUNITY): Payer: Self-pay

## 2017-12-29 ENCOUNTER — Telehealth: Payer: Self-pay | Admitting: Adult Health

## 2017-12-29 ENCOUNTER — Emergency Department (HOSPITAL_COMMUNITY)
Admission: EM | Admit: 2017-12-29 | Discharge: 2017-12-29 | Disposition: A | Discharge status: Home / Self Care | Payer: PPO | Attending: Emergency Medicine | Admitting: Emergency Medicine

## 2017-12-29 DIAGNOSIS — I10 Essential (primary) hypertension: Secondary | ICD-10-CM | POA: Diagnosis not present

## 2017-12-29 DIAGNOSIS — Z79899 Other long term (current) drug therapy: Secondary | ICD-10-CM | POA: Diagnosis not present

## 2017-12-29 DIAGNOSIS — W228XXA Striking against or struck by other objects, initial encounter: Secondary | ICD-10-CM | POA: Insufficient documentation

## 2017-12-29 DIAGNOSIS — S81812A Laceration without foreign body, left lower leg, initial encounter: Secondary | ICD-10-CM | POA: Diagnosis not present

## 2017-12-29 DIAGNOSIS — Y999 Unspecified external cause status: Secondary | ICD-10-CM | POA: Insufficient documentation

## 2017-12-29 DIAGNOSIS — Z96653 Presence of artificial knee joint, bilateral: Secondary | ICD-10-CM | POA: Diagnosis not present

## 2017-12-29 DIAGNOSIS — I480 Paroxysmal atrial fibrillation: Secondary | ICD-10-CM | POA: Diagnosis not present

## 2017-12-29 DIAGNOSIS — I2510 Atherosclerotic heart disease of native coronary artery without angina pectoris: Secondary | ICD-10-CM | POA: Diagnosis not present

## 2017-12-29 DIAGNOSIS — Y929 Unspecified place or not applicable: Secondary | ICD-10-CM | POA: Insufficient documentation

## 2017-12-29 DIAGNOSIS — Z7902 Long term (current) use of antithrombotics/antiplatelets: Secondary | ICD-10-CM | POA: Insufficient documentation

## 2017-12-29 DIAGNOSIS — Y9301 Activity, walking, marching and hiking: Secondary | ICD-10-CM | POA: Insufficient documentation

## 2017-12-29 MED ORDER — LIDOCAINE HCL (PF) 1 % IJ SOLN
30.0000 mL | Freq: Once | INTRAMUSCULAR | Status: AC
  Administered 2017-12-29: 30 mL
  Filled 2017-12-29: qty 30

## 2017-12-29 MED ORDER — PENTAFLUOROPROP-TETRAFLUOROETH EX AERO
INHALATION_SPRAY | Freq: Once | CUTANEOUS | Status: AC
  Administered 2017-12-29: 19:00:00 via TOPICAL
  Filled 2017-12-29: qty 103.5

## 2017-12-29 NOTE — Telephone Encounter (Signed)
Den[facility] [phone] family is refusing home care. They were looking for placement to a nursing facility, the social worker with [facility] has already met with the family regarding this. FYI °

## 2017-12-29 NOTE — Telephone Encounter (Signed)
Okay to refill? [facility] has medication associated with [facility]. Please advise. Thanks, MI

## 2017-12-29 NOTE — Telephone Encounter (Signed)
Noted. Thank you again for your assistance.

## 2017-12-29 NOTE — ED Provider Notes (Signed)
[facility] DEPT Provider Note   CSN: [id number] Arrival date & time: 12/29/17  1808     History   Chief Complaint Chief Complaint  Patient presents with  . Laceration    HPI [name] [name] is a 84 y.o. female.  Pt presents to the ED today with a laceration to her left lower leg.  The pt had a stroke a few months ago and does not walk well.  The pt was walking up the stairs with her care giver and scraped her leg on a brick step.  The pt did not fall or have any other injury.  The family said she may be on coumadin.  Med list on file just shows plavix.  Family thinks last tet[facility] was this [facility] at [facility].     Past Medical History:  Diagnosis Date  . Arthritis    "thumbs, joints" (03/23/2017  . Basal cell carcinoma    "several; scattered over my face, hands, leg some cut off; some burned off"  . Blind left eye    a. Corneal transplant x3 with rejection  . CAD (coronary artery disease)    a. s/p NSTEMI 2015 treated conservatively; nuclear stress test low risk. b. Continued angina despite med rx 07/2015 - s/p Bio Freedom Stent to ramus intermedius with mod LAD stenosis neg by FFR. // Nuc study 5/19:  EF 67, no ischemia or scar; Low Risk   . Complication of anesthesia    "very sensitive to RX"  . Diverticulitis large intestine   . Diverticulosis   . Family history of adverse reaction to anesthesia    "we all get PONV" (1/1/201)  . Gastritis   . GERD (gastroesophageal reflux disease)   . Hayfever   . Helicobacter pylori (H. pylori) 05/22/02   RUT-Positive  . Hypertension   . Myocardial infarction ([place]) 2015  . Squamous carcinoma    "several; scattered over my face, hands, leg some cut off; some burned off"  . Stroke [facility]) 2015   denies residual on 08/07/2015  . Stroke [facility]) 03/18/2017   "has effected her vision, balance, some memory" (03/23/2017)  . SVT (supraventricular tachycardia) (HCC)    a. seen by Dr. Caryl Comes - has loop recorder in. Patient  has declined amiodarone due to side effect profile  . Varicose veins     Patient Active Problem List   Diagnosis Date Noted  . Vascular dementia with behavior disturbance ([place])   . Epigastric pain 11/18/2017  . Belching 11/18/2017  . Diastolic dysfunction   . Hypokalemia   . Seizures ([place])   . PAF (paroxysmal atrial fibrillation) ([place])   . Cerebral embolism with cerebral infarction 09/21/2017  . Acute CVA (cerebrovascular accident) ([place]) 09/21/2017  . Blind 04/02/2017  . Visual field cut- right 04/02/2017  . Gastroesophageal reflux disease   . Pre-diabetes   . Blind left eye- (Fuch's disease) 03/05/2015  . Essential hypertension 12/19/2014  . CAD (coronary artery disease) 12/19/2014  . History of Rt brain stroke Oct 2015 06/14/2014  . Headache 06/14/2014  . Malignant hypertension 01/20/2014  . Aphasia 01/04/2014  . Paresthesia 01/04/2014  . Generalized abdominal pain 01/01/2014  . History of diverticulitis 01/01/2014  . Diaphoresis 12/29/2012  . Sleep disturbance 10/03/2012  . Depression 07/12/2012  . OA (osteoarthritis) of knee 06/27/2012  . History of PSVT 12/15/2011    Past Surgical History:  Procedure Laterality Date  . BASAL CELL CARCINOMA EXCISION     "several; scattered over my face, hands,  leg some cut off; some burned off"  . CARDIAC CATHETERIZATION N/A 08/07/2015   Procedure: Left Heart Cath and Coronary Angiography;  Surgeon: [name], MD;  Location: [facility] CV LAB;  Service: Cardiovascular;  Laterality: N/A;  . CARDIAC CATHETERIZATION N/A 08/07/2015   Procedure: Coronary Stent Intervention;  Surgeon: [name], MD;  Location: [place] [facility];  Service: Cardiovascular;  Laterality: N/A;  . CARDIAC CATHETERIZATION N/A 08/07/2015   Procedure: Intravascular Pressure Wire/FFR Study;  Surgeon: [name], MD;  Location: [facility];  Service: Cardiovascular;  Laterality: N/A;  . CATARACT EXTRACTION W/ INTRAOCULAR LENS  IMPLANT, BILATERAL  Bilateral 2005  . CLOSED REDUCTION SHOULDER DISLOCATION Left 2016 X 2  . CORNEAL TRANSPLANT Bilateral right 2009, left 2009    3 in left eye (last 2 failed), 1 in right eye  . DILATION AND CURETTAGE OF UTERUS    . EYE SURGERY    . JOINT REPLACEMENT    . [place]   "had gangrene in it"  . LOOP RECORDER IMPLANT N/A 01/05/2014   Procedure: LOOP RECORDER IMPLANT;  Surgeon: Coralyn Mark, MD;  Location: [facility] CATH LAB;  Service: Cardiovascular;  Laterality: N/A;  . SQUAMOUS CELL CARCINOMA EXCISION     "several; scattered over my face, hands, leg some cut off; some burned off"  . TONSILLECTOMY    . TOTAL ABDOMINAL HYSTERECTOMY  11/1983   "ovaries and all"  . TOTAL KNEE ARTHROPLASTY Left 06/27/2012   Procedure: LEFT TOTAL KNEE ARTHROPLASTY;  Surgeon: Gearlean Alf, MD;  Location: [facility];  Service: Orthopedics;  Laterality: Left;  . TOTAL KNEE ARTHROPLASTY Right 12/12/2012   Procedure: RIGHT TOTAL KNEE ARTHROPLASTY;  Surgeon: Gearlean Alf, MD;  Location: [facility];  Service: Orthopedics;  Laterality: Right;  . TUBAL LIGATION  1966     OB History   None      Home Medications    Prior to Admission medications   Medication Sig Start Date End Date Taking? Authorizing Provider  ALPRAZolam (XANAX) 0.5 MG tablet Take 0.25-0.5 mg by mouth every 8 (eight) hours as needed. 12/10/17   [provider]  amLODipine (NORVASC) 5 MG tablet Take 1 tablet (5 mg total) by mouth daily. 12/01/17 11/26/18  [name], MD  cloNIDine (CATAPRES - DOSED IN MG/24 HR) 0.2 mg/24hr patch Place 1 patch (0.2 mg total) onto the skin once a week. 12/21/17   Ethelene Hal, NP  clopidogrel (PLAVIX) 75 MG tablet take 1 tablet by mouth once daily 12/10/14   [provider]  divalproex (DEPAKOTE ER) 250 MG 24 hr tablet Please give 2 tablets (500mg ) in the morning and 1 tablet (250mg ) in the evening 12/06/17   Venancio Poisson, NP  Multiple Vitamins-Minerals (PRESERVISION AREDS  PO) Take 1 tablet by mouth daily.    [provider]  nebivolol (BYSTOLIC) 2.5 MG tablet Take 1 tablet (2.5 mg total) by mouth 2 (two) times daily. Patient taking differently: Take 2.5 mg by mouth 3 (three) times daily.  08/10/17   Richardson Dopp T, PA-C  nitroGLYCERIN (NITROSTAT) 0.4 MG SL tablet Place 1 tablet (0.4 mg total) under the tongue every 5 (five) minutes as needed for chest pain. 08/08/15   Lyda Jester M, PA-C  OLANZapine zydis (ZYPREXA) 5 MG disintegrating tablet Take 0.5 tablets (2.5 mg total) by mouth at bedtime. 12/15/17   Ethelene Hal, NP  pantoprazole (PROTONIX) 40 MG tablet Take 1 tablet (40 mg total) by mouth 2 (two)  times daily before a meal. 11/18/17   Zehr, Janett Billow D, PA-C  prednisoLONE acetate (PRED FORTE) 1 % ophthalmic suspension Place 1 drop into both eyes See admin instructions. 1 drop into both eyes at bedtime spaced 5 minutes apart from Systane gel    [provider]  QUEtiapine (SEROQUEL) 25 MG tablet 1 tab during the day as needed and 1 tab nightly Patient taking differently: Take 25-50 mg by mouth 2 (two) times daily. 1 tab during the day as needed and 1 tab nightly 12/10/17   Venancio Poisson, NP  sertraline (ZOLOFT) 50 MG tablet Take 50 mg by mouth daily.  10/28/17   [provider]  sucralfate (CARAFATE) 1 GM/10ML suspension Take 10 mLs (1 g total) by mouth 4 (four) times daily -  with meals and at bedtime. 10/18/17   Hayden Rasmussen, MD    Family History Family History  Problem Relation Age of Onset  . Stroke Mother   . Heart attack Father   . Heart attack Brother   . Cancer Brother   . Heart attack Brother   . Heart attack Brother   . Heart attack Brother   . Parkinsonism Brother     Social History Social History   Tobacco Use  . Smoking status: Never Smoker  . Smokeless tobacco: Never Used  Substance Use Topics  . Alcohol use: No  . Drug use: No     Allergies   Fluorescein; Hydralazine hcl; Sulfa  antibiotics; Ultram [tramadol]; Yellow dye; Buprenex [buprenorphine hcl]; Keflex [cephalexin]; Lipitor [atorvastatin]; Restasis [cyclosporine]; Augmentin [amoxicillin-pot clavulanate]; Levofloxacin; Morphine and related; Percocet [oxycodone-acetaminophen]; Tape; and Zetia [ezetimibe]   Review of Systems Review of Systems  Skin: Positive for wound.  All other systems reviewed and are negative.    Physical Exam Updated Vital Signs BP 136/80 (BP Location: Left Arm)   Pulse 82   Resp 16   SpO2 97%   Physical Exam  Constitutional: She appears well-developed and well-nourished.  HENT:  Right Ear: External ear normal.  Left Ear: External ear normal.  Nose: Nose normal.  Mouth/Throat: Oropharynx is clear and moist.  Eyes:  Blind in left eye, ptosis left eye  Neck: Normal range of motion. Neck supple.  Cardiovascular: Normal rate, regular rhythm, normal heart sounds and intact distal pulses.  Pulmonary/Chest: Effort normal and breath sounds normal.  Abdominal: Soft. Bowel sounds are normal.  Musculoskeletal: Normal range of motion.  Neurological: She is alert.  Unable to assess orientation.  Word salad.  Psychiatric: She has a normal mood and affect.  Nursing note and vitals reviewed.    ED Treatments / Results  Labs (all labs ordered are listed, but only abnormal results are displayed) Labs Reviewed - No data to display  EKG None  Radiology Dg Tibia/fibula Left  Result Date: 12/29/2017 CLINICAL DATA:  Pt presents with an avulsion on her L lower leg after scraping it on a brick step. Bleeding not well controlled. Pt takes coumadin. EDP made aware EXAM: LEFT TIBIA AND FIBULA - 2 VIEW COMPARISON:  101415 foot radiographs. FINDINGS: Left knee arthroplasty. Extensive clothing artifact. Probable soft tissue injury about the lateral anterior distal lower extremity with overlying bandage. No acute fracture or dislocation. No radiopaque foreign object. Tiny Achilles spur. IMPRESSION:  No acute osseous abnormality. Electronically Signed   By: Abigail Miyamoto M.D.   On: 12/29/2017 20:03    Procedures .Marland KitchenLaceration Repair Date/Time: 12/29/2017 7:56 PM Performed by: Isla Pence, MD Authorized by: Isla Pence, MD  Consent:    Consent obtained:  Verbal   Consent given by:  Guardian   Risks discussed:  Infection, pain, poor cosmetic result, poor wound healing and need for additional repair   Alternatives discussed:  No treatment Anesthesia (see MAR for exact dosages):    Anesthesia method:  Topical application and local infiltration   Local anesthetic:  Lidocaine 1% w/o epi Laceration details:    Location:  Leg   Leg location:  L lower leg Repair type:    Repair type:  Intermediate Pre-procedure details:    Preparation:  Patient was prepped and draped in usual sterile fashion Exploration:    Contaminated: no   Treatment:    Area cleansed with:  Saline   Amount of cleaning:  Standard   Irrigation solution:  Sterile saline   Irrigation method:  Syringe Skin repair:    Repair method:  Steri-Strips Post-procedure details:    Dressing:  Non-adherent dressing and sterile dressing   Patient tolerance of procedure:  Tolerated well, no immediate complications Comments:     I initially tried to suture wound, but skin would just tear.  Steri strips brought skin close together, but not perfectly.  Family aware.  I did recommend visiting the wound care center.  Amb referral made.   (including critical care time)  Medications Ordered in ED Medications  lidocaine (PF) (XYLOCAINE) 1 % injection 30 mL (30 mLs Infiltration Given 12/29/17 1901)  pentafluoroprop-tetrafluoroeth (GEBAUERS) aerosol ( Topical Given 12/29/17 1901)     Initial Impression / Assessment and Plan / ED Course  I have reviewed the triage vital signs and the nursing notes.  Pertinent labs & imaging results that were available during my care of the patient were reviewed by me and considered in my  medical decision making (see chart for details).    Pt does not have a fracture.  She is d/c home with her family.  F/u with wound care.  Final Clinical Impressions(s) / ED Diagnoses   Final diagnoses:  Laceration of left lower extremity, initial encounter    ED Discharge Orders         Ordered    AMB referral to wound care center     12/29/17 1954           Isla Pence, MD 12/29/17 2016

## 2017-12-29 NOTE — ED Triage Notes (Signed)
Pt presents with an avulsion on her L lower leg after scraping it on a brick step. Bleeding not well controlled. Pt takes coumadin. EDP made aware.

## 2017-12-29 NOTE — ED Notes (Signed)
Pt wound dressed with tefla, 4x4, kerlex and coban

## 2017-12-29 NOTE — Telephone Encounter (Addendum)
[name] [name]  has given her resources [facility]. That Can Help with Her mom.  Daughter was asking for other medication changes to be made. Langley Gauss Relayed she can't do anything with RX changes.   Daughter states she want's to put everything on hold right now because her son is getting married.

## 2017-12-29 NOTE — Telephone Encounter (Signed)
I called daughter back and asked her if she took her mother to [place] . She did not take her. Daughter has Not called [facility] . Back and scheduled apt for mother yet either.  Daughter states she is ok for now she is focusing on her son getting married . Daughter states her mother has been good the last few days.     I relayed to patient's daughter again we will see her in follow up and Jessica can't  Right any RX and Order's for Long term care . Daughter understood . I asked her to please follow up on the apt's and she had there telephone number's .

## 2017-12-30 MED ORDER — CLOPIDOGREL BISULFATE 75 MG PO TABS: 75.0000 mg | ORAL_TABLET | Freq: Every day | ORAL | 3 refills | Status: DC

## 2017-12-30 NOTE — Telephone Encounter (Signed)
Spoke with patients daughter [name] and she is questioning why this would need to be deferred to neurology if Dr Burt Knack has always refilled it. She would like a call back today at [phone] as patient is almost out of medication. Thanks, MI

## 2018-01-03 ENCOUNTER — Other Ambulatory Visit: Payer: Self-pay | Admitting: Cardiovascular Disease

## 2018-01-03 DIAGNOSIS — S81819A Laceration without foreign body, unspecified lower leg, initial encounter: Secondary | ICD-10-CM | POA: Diagnosis not present

## 2018-01-03 DIAGNOSIS — F0151 Vascular dementia with behavioral disturbance: Secondary | ICD-10-CM | POA: Diagnosis not present

## 2018-01-09 ENCOUNTER — Other Ambulatory Visit: Payer: Self-pay | Admitting: Gastroenterology

## 2018-01-10 ENCOUNTER — Telehealth: Payer: Self-pay

## 2018-01-10 NOTE — Telephone Encounter (Signed)
Rec'd refill request for zantac 150 mg from [facility] [address]. Called pt to discuss.  Pt's daughter indicated that her mother has had stroke and can no longer communicate. She also said they do not need the refill and has told [facility] this before.  Script refused as inappropriate.

## 2018-01-12 DIAGNOSIS — F29 Unspecified psychosis not due to a substance or known physiological condition: Secondary | ICD-10-CM | POA: Diagnosis not present

## 2018-01-13 DIAGNOSIS — F29 Unspecified psychosis not due to a substance or known physiological condition: Secondary | ICD-10-CM | POA: Diagnosis not present

## 2018-01-14 DIAGNOSIS — Z5189 Encounter for other specified aftercare: Secondary | ICD-10-CM | POA: Diagnosis not present

## 2018-01-15 ENCOUNTER — Emergency Department (HOSPITAL_COMMUNITY)
Admission: EM | Admit: 2018-01-15 | Discharge: 2018-01-15 | Disposition: A | Discharge status: Home / Self Care | Payer: PPO | Attending: Emergency Medicine | Admitting: Emergency Medicine

## 2018-01-15 ENCOUNTER — Emergency Department (HOSPITAL_COMMUNITY): Payer: PPO

## 2018-01-15 DIAGNOSIS — Z85828 Personal history of other malignant neoplasm of skin: Secondary | ICD-10-CM | POA: Insufficient documentation

## 2018-01-15 DIAGNOSIS — F0390 Unspecified dementia without behavioral disturbance: Secondary | ICD-10-CM | POA: Diagnosis not present

## 2018-01-15 DIAGNOSIS — Y999 Unspecified external cause status: Secondary | ICD-10-CM | POA: Diagnosis not present

## 2018-01-15 DIAGNOSIS — Y929 Unspecified place or not applicable: Secondary | ICD-10-CM | POA: Diagnosis not present

## 2018-01-15 DIAGNOSIS — W01198A Fall on same level from slipping, tripping and stumbling with subsequent striking against other object, initial encounter: Secondary | ICD-10-CM | POA: Diagnosis not present

## 2018-01-15 DIAGNOSIS — I252 Old myocardial infarction: Secondary | ICD-10-CM | POA: Insufficient documentation

## 2018-01-15 DIAGNOSIS — Y9301 Activity, walking, marching and hiking: Secondary | ICD-10-CM | POA: Insufficient documentation

## 2018-01-15 DIAGNOSIS — Z79899 Other long term (current) drug therapy: Secondary | ICD-10-CM | POA: Insufficient documentation

## 2018-01-15 DIAGNOSIS — S0011XA Contusion of right eyelid and periocular area, initial encounter: Secondary | ICD-10-CM | POA: Insufficient documentation

## 2018-01-15 DIAGNOSIS — S0990XA Unspecified injury of head, initial encounter: Secondary | ICD-10-CM | POA: Diagnosis not present

## 2018-01-15 DIAGNOSIS — S80211A Abrasion, right knee, initial encounter: Secondary | ICD-10-CM | POA: Diagnosis not present

## 2018-01-15 DIAGNOSIS — S60511A Abrasion of right hand, initial encounter: Secondary | ICD-10-CM | POA: Diagnosis not present

## 2018-01-15 DIAGNOSIS — I10 Essential (primary) hypertension: Secondary | ICD-10-CM | POA: Diagnosis not present

## 2018-01-15 DIAGNOSIS — S199XXA Unspecified injury of neck, initial encounter: Secondary | ICD-10-CM | POA: Diagnosis not present

## 2018-01-15 DIAGNOSIS — I2510 Atherosclerotic heart disease of native coronary artery without angina pectoris: Secondary | ICD-10-CM | POA: Diagnosis not present

## 2018-01-15 DIAGNOSIS — W19XXXA Unspecified fall, initial encounter: Secondary | ICD-10-CM

## 2018-01-15 LAB — I-STAT CHEM 8, ED
BUN: 15 mg/dL (ref 8–23)
CREATININE: 0.5 mg/dL (ref 0.44–1.00)
Calcium, Ion: 1.13 mmol/L — ABNORMAL LOW (ref 1.15–1.40)
Chloride: 103 mmol/L (ref 98–111)
Glucose, Bld: 109 mg/dL — ABNORMAL HIGH (ref 70–99)
HEMATOCRIT: 38 % (ref 36.0–46.0)
Hemoglobin: 12.9 g/dL (ref 12.0–15.0)
POTASSIUM: 3.9 mmol/L (ref 3.5–5.1)
Sodium: 143 mmol/L (ref 135–145)
TCO2: 32 mmol/L (ref 22–32)

## 2018-01-15 MED ORDER — LORAZEPAM 2 MG/ML IJ SOLN
1.0000 mg | Freq: Once | INTRAMUSCULAR | Status: AC
  Administered 2018-01-15: 1 mg via INTRAMUSCULAR
  Filled 2018-01-15: qty 1

## 2018-01-15 MED ORDER — LORAZEPAM 2 MG/ML IJ SOLN
0.5000 mg | Freq: Once | INTRAMUSCULAR | Status: AC
  Administered 2018-01-15: 0.5 mg via INTRAVENOUS
  Filled 2018-01-15: qty 1

## 2018-01-15 NOTE — Discharge Instructions (Addendum)
CT scans today showed no no brain injury.  [name] [name] should avoid hitting her head again to prevent further injury.  Otherwise she can resume her normal activities.  Wash the abrasion on her right hand with soap and water daily.  Signs of infection include redness around the wound, swelling, drainage from the wound or fever.  Return or see her primary care physician if concern for any reason

## 2018-01-15 NOTE — ED Provider Notes (Signed)
[place] DEPT Provider Note   CSN: [id number] Arrival date & time: 01/15/18  1113     History   Chief Complaint Chief Complaint  Patient presents with  . Fall  . Head Injury  Level 5 caveat memory impairment.  History is obtained from patient's daughter who accompanies her  HPI [name] [name] is a 84 y.o. female.  HPI Tripped and fell while walking 9 AM today hitting her head on the doorway.  She also suffered a abrasion to her right hand as result of fall.  Daughter reports the patient does not answer questions at baseline.  And is agitated at baseline.  She is behaving as her normal self since the event. Past Medical History:  Diagnosis Date  . Arthritis    "thumbs, joints" (03/23/2017  . Basal cell carcinoma    "several; scattered over my face, hands, leg some cut off; some burned off"  . Blind left eye    a. Corneal transplant x3 with rejection  . CAD (coronary artery disease)    a. s/p NSTEMI 2015 treated conservatively; nuclear stress test low risk. b. Continued angina despite med rx 07/2015 - s/p Bio Freedom Stent to ramus intermedius with mod LAD stenosis neg by FFR. // Nuc study 5/19:  EF 67, no ischemia or scar; Low Risk   . Complication of anesthesia    "very sensitive to RX"  . Diverticulitis large intestine   . Diverticulosis   . Family history of adverse reaction to anesthesia    "we all get PONV" (1/1/201)  . Gastritis   . GERD (gastroesophageal reflux disease)   . Hayfever   . Helicobacter pylori (H. pylori) 05/22/02   RUT-Positive  . Hypertension   . Myocardial infarction ([place]) 2015  . Squamous carcinoma    "several; scattered over my face, hands, leg some cut off; some burned off"  . Stroke [facility]) 2015   denies residual on 08/07/2015  . Stroke [facility]) 03/18/2017   "has effected her vision, balance, some memory" (03/23/2017)  . SVT (supraventricular tachycardia) (HCC)    a. seen by Dr. Caryl Comes - has loop recorder in. Patient has  declined amiodarone due to side effect profile  . Varicose veins     Patient Active Problem List   Diagnosis Date Noted  . Vascular dementia with behavior disturbance ([place])   . Epigastric pain 11/18/2017  . Belching 11/18/2017  . Diastolic dysfunction   . Hypokalemia   . Seizures ([place])   . PAF (paroxysmal atrial fibrillation) ([place])   . Cerebral embolism with cerebral infarction 09/21/2017  . Acute CVA (cerebrovascular accident) ([place]) 09/21/2017  . Blind 04/02/2017  . Visual field cut- right 04/02/2017  . Gastroesophageal reflux disease   . Pre-diabetes   . Blind left eye- (Fuch's disease) 03/05/2015  . Essential hypertension 12/19/2014  . CAD (coronary artery disease) 12/19/2014  . History of Rt brain stroke Oct 2015 06/14/2014  . Headache 06/14/2014  . Malignant hypertension 01/20/2014  . Aphasia 01/04/2014  . Paresthesia 01/04/2014  . Generalized abdominal pain 01/01/2014  . History of diverticulitis 01/01/2014  . Diaphoresis 12/29/2012  . Sleep disturbance 10/03/2012  . Depression 07/12/2012  . OA (osteoarthritis) of knee 06/27/2012  . History of PSVT 12/15/2011    Past Surgical History:  Procedure Laterality Date  . BASAL CELL CARCINOMA EXCISION     "several; scattered over my face, hands, leg some cut off; some burned off"  . CARDIAC CATHETERIZATION N/A 08/07/2015   Procedure: Left  Heart Cath and Coronary Angiography;  Surgeon: [name], MD;  Location: [place] CV LAB;  Service: Cardiovascular;  Laterality: N/A;  . CARDIAC CATHETERIZATION N/A 08/07/2015   Procedure: Coronary Stent Intervention;  Surgeon: [name], MD;  Location: [place] CV LAB;  Service: Cardiovascular;  Laterality: N/A;  . CARDIAC CATHETERIZATION N/A 08/07/2015   Procedure: Intravascular Pressure Wire/FFR Study;  Surgeon: [name], MD;  Location: [place] CV LAB;  Service: Cardiovascular;  Laterality: N/A;  . CATARACT EXTRACTION W/ INTRAOCULAR LENS  IMPLANT, BILATERAL  Bilateral 2005  . CLOSED REDUCTION SHOULDER DISLOCATION Left 2016 X 2  . CORNEAL TRANSPLANT Bilateral right 2009, left 2009    3 in left eye (last 2 failed), 1 in right eye  . DILATION AND CURETTAGE OF UTERUS    . EYE SURGERY    . JOINT REPLACEMENT    . [place]   "had gangrene in it"  . LOOP RECORDER IMPLANT N/A 01/05/2014   Procedure: LOOP RECORDER IMPLANT;  Surgeon: Coralyn Mark, MD;  Location: [place] CATH LAB;  Service: Cardiovascular;  Laterality: N/A;  . SQUAMOUS CELL CARCINOMA EXCISION     "several; scattered over my face, hands, leg some cut off; some burned off"  . TONSILLECTOMY    . TOTAL ABDOMINAL HYSTERECTOMY  11/1983   "ovaries and all"  . TOTAL KNEE ARTHROPLASTY Left 06/27/2012   Procedure: LEFT TOTAL KNEE ARTHROPLASTY;  Surgeon: Gearlean Alf, MD;  Location: [facility];  Service: Orthopedics;  Laterality: Left;  . TOTAL KNEE ARTHROPLASTY Right 12/12/2012   Procedure: RIGHT TOTAL KNEE ARTHROPLASTY;  Surgeon: Gearlean Alf, MD;  Location: [facility];  Service: Orthopedics;  Laterality: Right;  . TUBAL LIGATION  1966     OB History   None      Home Medications    Prior to Admission medications   Medication Sig Start Date End Date Taking? Authorizing Provider  ALPRAZolam (XANAX) 0.5 MG tablet Take 0.25-0.5 mg by mouth every 8 (eight) hours as needed. 12/10/17  Yes [provider]  amLODipine (NORVASC) 5 MG tablet Take 1 tablet (5 mg total) by mouth daily. 12/01/17 11/26/18 Yes [name], MD  cloNIDine (CATAPRES - DOSED IN MG/24 HR) 0.2 mg/24hr patch PLACE 1 PATCH ONTO THE SKIN ONCE A WEEK. Patient taking differently: Place 0.2 mg onto the skin once a week.  01/03/18  Yes [name], MD  clopidogrel (PLAVIX) 75 MG tablet Take 1 tablet (75 mg total) by mouth daily. 12/30/17  Yes [name], MD  divalproex (DEPAKOTE ER) 250 MG 24 hr tablet Please give 2 tablets (500mg ) in the morning and 1 tablet (250mg ) in the evening Patient taking  differently: Take 250 mg by mouth 4 (four) times daily.  12/06/17  Yes Venancio Poisson, NP  donepezil (ARICEPT) 5 MG tablet Take 5 mg by mouth daily. 01/13/18  Yes [provider]  nebivolol (BYSTOLIC) 2.5 MG tablet Take 1 tablet (2.5 mg total) by mouth 2 (two) times daily. 08/10/17  Yes Weaver, Scott T, PA-C  nitroGLYCERIN (NITROSTAT) 0.4 MG SL tablet Place 1 tablet (0.4 mg total) under the tongue every 5 (five) minutes as needed for chest pain. 08/08/15  Yes Rosita Fire, Brittainy M, PA-C  pantoprazole (PROTONIX) 40 MG tablet Take 1 tablet (40 mg total) by mouth 2 (two) times daily before a meal. 11/18/17  Yes Zehr, Janett Billow D, PA-C  prednisoLONE acetate (PRED FORTE) 1 % ophthalmic suspension Place 1 drop into both eyes See admin instructions. 1  drop into both eyes at bedtime spaced 5 minutes apart from Systane gel   Yes [provider]  risperiDONE (RISPERDAL) 0.5 MG tablet Take 0.5 mg by mouth 3 (three) times daily. 01/13/18  Yes [provider]  sertraline (ZOLOFT) 50 MG tablet Take 50 mg by mouth daily.  10/28/17  Yes [provider]    Family History Family History  Problem Relation Age of Onset  . Stroke Mother   . Heart attack Father   . Heart attack Brother   . Cancer Brother   . Heart attack Brother   . Heart attack Brother   . Heart attack Brother   . Parkinsonism Brother     Social History Social History   Tobacco Use  . Smoking status: Never Smoker  . Smokeless tobacco: Never Used  Substance Use Topics  . Alcohol use: No  . Drug use: No     Allergies   Fluorescein; Hydralazine hcl; Sulfa antibiotics; Ultram [tramadol]; Yellow dye; Buprenex [buprenorphine hcl]; Keflex [cephalexin]; Lipitor [atorvastatin]; Restasis [cyclosporine]; Augmentin [amoxicillin-pot clavulanate]; Levofloxacin; Morphine and related; Percocet [oxycodone-acetaminophen]; Tape; and Zetia [ezetimibe]   Review of Systems Review of Systems  Skin: Positive for wound.        Bruising at right periorbital area.  Chronic wound on left lower extremity.  Abrasion to right knee  Hematological: Bruises/bleeds easily.     Physical Exam Updated Vital Signs BP 129/68 (BP Location: Left Arm)   Pulse 72   Resp 18   SpO2 98%   Physical Exam  Constitutional:  Chronically ill-appearing.  Appears agitated.    HENT:  Right sided periorbital ecchymosis and swelling  Eyes: EOM are normal.  Neck: Neck supple. No tracheal deviation present. No thyromegaly present.  Cardiovascular: Normal rate and regular rhythm.  No murmur heard. Pulmonary/Chest: Effort normal and breath sounds normal.  Abdominal: Soft. Bowel sounds are normal. She exhibits no distension. There is no tenderness.  Musculoskeletal: Normal range of motion. She exhibits no edema or tenderness.  5/5 overall moves all extremities well.  Somewhat combative.  Neurological: She is alert. Coordination normal.  Skin: Skin is warm and dry. No rash noted.  Right upper extremity there is a 1 cm curvilinear abrasion to the dorsum of the hand without soft tissue swelling.  Left lower extremity there is a chronic appearing wound at the distal aspect of the shin.  Psychiatric: She has a normal mood and affect.  Nursing note and vitals reviewed.    ED Treatments / Results  Labs (all labs ordered are listed, but only abnormal results are displayed) Labs Reviewed - No data to display  EKG None  Radiology No results found.  Procedures Procedures (including critical care time)  Medications Ordered in ED Medications  LORazepam (ATIVAN) injection 1 mg (1 mg Intramuscular Given 01/15/18 1250)    Patient administered Ativan as she she will be unable to cooperate with imaging agitation.  5 PM patient was resting comfortably.  Alert appears in no distress.  She will be driven home by her son. Results for orders placed or performed during the hospital encounter of 01/15/18  I-stat chem 8, ed  Result Value Ref  Range   Sodium 143 135 - 145 mmol/L   Potassium 3.9 3.5 - 5.1 mmol/L   Chloride 103 98 - 111 mmol/L   BUN 15 8 - 23 mg/dL   Creatinine, Ser 0.50 0.44 - 1.00 mg/dL   Glucose, Bld 109 (H) 70 - 99 mg/dL   Calcium,  Ion 1.13 (L) 1.15 - 1.40 mmol/L   TCO2 32 22 - 32 mmol/L   Hemoglobin 12.9 12.0 - 15.0 g/dL   HCT 38.0 36.0 - 46.0 %   Dg Tibia/fibula Left  Result Date: 12/29/2017 CLINICAL DATA:  Pt presents with an avulsion on her L lower leg after scraping it on a brick step. Bleeding not well controlled. Pt takes coumadin. EDP made aware EXAM: LEFT TIBIA AND FIBULA - 2 VIEW COMPARISON:  101415 foot radiographs. FINDINGS: Left knee arthroplasty. Extensive clothing artifact. Probable soft tissue injury about the lateral anterior distal lower extremity with overlying bandage. No acute fracture or dislocation. No radiopaque foreign object. Tiny Achilles spur. IMPRESSION: No acute osseous abnormality. Electronically Signed   By: Abigail Miyamoto M.D.   On: 12/29/2017 20:03   Ct Head Wo Contrast  Result Date: 01/15/2018 CLINICAL DATA:  [place] today and hit head. Laceration above right eye with bruising and hematoma. EXAM: CT HEAD WITHOUT CONTRAST CT CERVICAL SPINE WITHOUT CONTRAST TECHNIQUE: Multidetector CT imaging of the head and cervical spine was performed following the standard protocol without intravenous contrast. Multiplanar CT image reconstructions of the cervical spine were also generated. COMPARISON:  Multiple prior head CTs. FINDINGS: CT HEAD FINDINGS Brain: Remote left MCA and right occipital infarct with encephalomalacia. Stable age related cerebral atrophy, ventriculomegaly and periventricular white matter disease. No extra-axial fluid collections are identified. No CT findings for acute hemispheric infarction or intracranial hemorrhage. No mass lesions. The brainstem and cerebellum are normal. Vascular: No hyperdense vessel or unexpected calcification. Skull: No acute skull fracture or bone lesion.  Sinuses/Orbits: The paranasal sinuses and mastoid air cells are clear. The globes are intact. Other: No scalp lesions or hematoma. CT CERVICAL SPINE FINDINGS Alignment: Normal Skull base and vertebrae: No acute fractures identified. The skull base C1 and C1-2 articulations are maintained. Moderate degenerative changes at C1-2. The facets are normally aligned. Advanced facet disease but no fractures. Soft tissues and spinal canal: The spinal canal is generous. No visible canal hematoma. No abnormal prevertebral soft tissue swelling. Disc levels: No large disc protrusions, spinal or significant foraminal stenosis. Upper chest: The lung apices are grossly clear. There is marked tortuosity and calcification of the thoracic aorta noted. Other: No neck masses or adenopathy. IMPRESSION: 1. Remote infarcts but no acute intracranial abnormality or skull fracture. 2. Degenerative changes involving the cervical spine but no acute cervical spine fracture Electronically Signed   By: Marijo Sanes M.D.   On: 01/15/2018 16:51   Ct Cervical Spine Wo Contrast  Result Date: 01/15/2018 CLINICAL DATA:  [place] [address] today and hit head. Laceration above right eye with bruising and hematoma. EXAM: CT HEAD WITHOUT CONTRAST CT CERVICAL SPINE WITHOUT CONTRAST TECHNIQUE: Multidetector CT imaging of the head and cervical spine was performed following the standard protocol without intravenous contrast. Multiplanar CT image reconstructions of the cervical spine were also generated. COMPARISON:  Multiple prior head CTs. FINDINGS: CT HEAD FINDINGS Brain: Remote left MCA and right occipital infarct with encephalomalacia. Stable age related cerebral atrophy, ventriculomegaly and periventricular white matter disease. No extra-axial fluid collections are identified. No CT findings for acute hemispheric infarction or intracranial hemorrhage. No mass lesions. The brainstem and cerebellum are normal. Vascular: No hyperdense vessel or unexpected  calcification. Skull: No acute skull fracture or bone lesion. Sinuses/Orbits: The paranasal sinuses and mastoid air cells are clear. The globes are intact. Other: No scalp lesions or hematoma. CT CERVICAL SPINE FINDINGS Alignment: Normal Skull base and vertebrae: No acute fractures identified.  The skull base C1 and C1-2 articulations are maintained. Moderate degenerative changes at C1-2. The facets are normally aligned. Advanced facet disease but no fractures. Soft tissues and spinal canal: The spinal canal is generous. No visible canal hematoma. No abnormal prevertebral soft tissue swelling. Disc levels: No large disc protrusions, spinal or significant foraminal stenosis. Upper chest: The lung apices are grossly clear. There is marked tortuosity and calcification of the thoracic aorta noted. Other: No neck masses or adenopathy. IMPRESSION: 1. Remote infarcts but no acute intracranial abnormality or skull fracture. 2. Degenerative changes involving the cervical spine but no acute cervical spine fracture Electronically Signed   By: Marijo Sanes M.D.   On: 01/15/2018 16:51  No evidence of intracranial injury Initial Impression / Assessment and Plan / ED Course  I have reviewed the triage vital signs and the nursing notes.  Pertinent labs & imaging results that were available during my care of the patient were reviewed by me and considered in my medical decision making (see chart for details).     Plan Home with family.  Head injury instructions  Final Clinical Impressions(s) / ED Diagnoses  Diagnoses #1 fall #2 Minor closed head injury #3 abrasion to right hand #4 chronic wound left leg Final diagnoses:  None    ED Discharge Orders    None       Orlie Dakin, MD 01/15/18 1704

## 2018-01-15 NOTE — ED Triage Notes (Signed)
Pt fell today and hit her head. Has laceration above right eye with bruising and hematoma. Pt is on Plavix.  Pt also hurt her right hand.

## 2018-01-18 ENCOUNTER — Telehealth: Payer: Self-pay | Admitting: Cardiovascular Disease

## 2018-01-18 ENCOUNTER — Encounter (HOSPITAL_BASED_OUTPATIENT_CLINIC_OR_DEPARTMENT_OTHER): Payer: Self-pay

## 2018-01-18 ENCOUNTER — Encounter (HOSPITAL_BASED_OUTPATIENT_CLINIC_OR_DEPARTMENT_OTHER): Payer: PPO | Attending: Internal Medicine

## 2018-01-18 DIAGNOSIS — I69318 Other symptoms and signs involving cognitive functions following cerebral infarction: Secondary | ICD-10-CM | POA: Insufficient documentation

## 2018-01-18 DIAGNOSIS — I69398 Other sequelae of cerebral infarction: Secondary | ICD-10-CM | POA: Diagnosis not present

## 2018-01-18 DIAGNOSIS — L97822 Non-pressure chronic ulcer of other part of left lower leg with fat layer exposed: Secondary | ICD-10-CM | POA: Diagnosis not present

## 2018-01-18 DIAGNOSIS — D692 Other nonthrombocytopenic purpura: Secondary | ICD-10-CM | POA: Diagnosis not present

## 2018-01-18 DIAGNOSIS — I252 Old myocardial infarction: Secondary | ICD-10-CM | POA: Diagnosis not present

## 2018-01-18 DIAGNOSIS — Z85828 Personal history of other malignant neoplasm of skin: Secondary | ICD-10-CM | POA: Insufficient documentation

## 2018-01-18 DIAGNOSIS — I87332 Chronic venous hypertension (idiopathic) with ulcer and inflammation of left lower extremity: Secondary | ICD-10-CM | POA: Insufficient documentation

## 2018-01-18 DIAGNOSIS — F0150 Vascular dementia without behavioral disturbance: Secondary | ICD-10-CM | POA: Diagnosis not present

## 2018-01-18 DIAGNOSIS — G40909 Epilepsy, unspecified, not intractable, without status epilepticus: Secondary | ICD-10-CM | POA: Diagnosis not present

## 2018-01-18 DIAGNOSIS — I2510 Atherosclerotic heart disease of native coronary artery without angina pectoris: Secondary | ICD-10-CM | POA: Diagnosis not present

## 2018-01-18 DIAGNOSIS — S61411A Laceration without foreign body of right hand, initial encounter: Secondary | ICD-10-CM | POA: Insufficient documentation

## 2018-01-18 DIAGNOSIS — Z96659 Presence of unspecified artificial knee joint: Secondary | ICD-10-CM | POA: Diagnosis not present

## 2018-01-18 DIAGNOSIS — S81802A Unspecified open wound, left lower leg, initial encounter: Secondary | ICD-10-CM | POA: Diagnosis not present

## 2018-01-18 DIAGNOSIS — X58XXXA Exposure to other specified factors, initial encounter: Secondary | ICD-10-CM | POA: Insufficient documentation

## 2018-01-18 DIAGNOSIS — H547 Unspecified visual loss: Secondary | ICD-10-CM | POA: Diagnosis not present

## 2018-01-18 DIAGNOSIS — R4182 Altered mental status, unspecified: Secondary | ICD-10-CM | POA: Diagnosis not present

## 2018-01-18 DIAGNOSIS — I872 Venous insufficiency (chronic) (peripheral): Secondary | ICD-10-CM | POA: Diagnosis not present

## 2018-01-18 NOTE — Telephone Encounter (Signed)
Patient's daughter called back. Patient is confused and daughter is having a hard time getting patient's to take her medications. Patient has not taken any of her medications since Saturday and her BP's have been 100/55, 118//68, 96/50 and the most recent 119/61 with HR 85. Patient HR has been in the 80's. Encouraged patient's daughter to check BP before she attempts to give patient medications, and if her SBP is lower then 114 to hold BP medications. Patient also has been taking xanax for agitation, and not eating. Informed patient's daughter this is probably contributing to her BP being low. Informed patient's daughter when patient starts eating more that her BP should come back up.  Will forward message to Dr. Burt Knack for further advisement.

## 2018-01-18 NOTE — Telephone Encounter (Signed)
Pt c/o BP issue:  1. What are your last 5 BP readings? 100/55 118/68 96/50  2. Are you having any other symptoms (ex. Dizziness, headache, blurred vision, passed out)? Patient fell on Saturday and hit her hand. 3. What is your medication issue? Take Medication BP

## 2018-01-18 NOTE — Telephone Encounter (Signed)
Left message for daughter to call back.  

## 2018-01-18 NOTE — Telephone Encounter (Signed)
Called patient's daughter and gave her Dr. Antionette Char parameters for holding blood pressure medications. Patient's daughter verbalized understanding.

## 2018-01-18 NOTE — Telephone Encounter (Signed)
I agree - would hold blood pressure medicine unless she is seeing BP's > 130/85. thanks

## 2018-01-19 ENCOUNTER — Other Ambulatory Visit: Payer: Self-pay | Admitting: Cardiovascular Disease

## 2018-01-20 MED ORDER — CLONIDINE 0.2 MG/24HR TD PTWK: MEDICATED_PATCH | TRANSDERMAL | 3 refills | Status: DC

## 2018-01-20 NOTE — Addendum Note (Signed)
Addended by: Derl Barrow on: 01/20/2018 10:20 AM   Modules accepted: Orders

## 2018-01-25 ENCOUNTER — Encounter (HOSPITAL_BASED_OUTPATIENT_CLINIC_OR_DEPARTMENT_OTHER): Payer: PPO | Attending: Internal Medicine

## 2018-01-25 DIAGNOSIS — I87332 Chronic venous hypertension (idiopathic) with ulcer and inflammation of left lower extremity: Secondary | ICD-10-CM | POA: Insufficient documentation

## 2018-01-25 DIAGNOSIS — L98492 Non-pressure chronic ulcer of skin of other sites with fat layer exposed: Secondary | ICD-10-CM | POA: Insufficient documentation

## 2018-01-25 DIAGNOSIS — F0390 Unspecified dementia without behavioral disturbance: Secondary | ICD-10-CM | POA: Diagnosis not present

## 2018-01-25 DIAGNOSIS — S81802A Unspecified open wound, left lower leg, initial encounter: Secondary | ICD-10-CM | POA: Diagnosis not present

## 2018-01-25 DIAGNOSIS — L97822 Non-pressure chronic ulcer of other part of left lower leg with fat layer exposed: Secondary | ICD-10-CM | POA: Diagnosis not present

## 2018-01-25 DIAGNOSIS — S61401A Unspecified open wound of right hand, initial encounter: Secondary | ICD-10-CM | POA: Diagnosis not present

## 2018-01-25 DIAGNOSIS — I252 Old myocardial infarction: Secondary | ICD-10-CM | POA: Insufficient documentation

## 2018-01-25 DIAGNOSIS — I2510 Atherosclerotic heart disease of native coronary artery without angina pectoris: Secondary | ICD-10-CM | POA: Diagnosis not present

## 2018-01-27 DIAGNOSIS — F29 Unspecified psychosis not due to a substance or known physiological condition: Secondary | ICD-10-CM | POA: Diagnosis not present

## 2018-02-01 DIAGNOSIS — S81802A Unspecified open wound, left lower leg, initial encounter: Secondary | ICD-10-CM | POA: Diagnosis not present

## 2018-02-01 DIAGNOSIS — S61401A Unspecified open wound of right hand, initial encounter: Secondary | ICD-10-CM | POA: Diagnosis not present

## 2018-02-01 DIAGNOSIS — I87332 Chronic venous hypertension (idiopathic) with ulcer and inflammation of left lower extremity: Secondary | ICD-10-CM | POA: Diagnosis not present

## 2018-02-02 ENCOUNTER — Ambulatory Visit: Payer: PPO | Admitting: Adult Health

## 2018-02-02 DIAGNOSIS — I10 Essential (primary) hypertension: Secondary | ICD-10-CM | POA: Diagnosis not present

## 2018-02-02 DIAGNOSIS — I6930 Unspecified sequelae of cerebral infarction: Secondary | ICD-10-CM | POA: Diagnosis not present

## 2018-02-02 DIAGNOSIS — T148XXA Other injury of unspecified body region, initial encounter: Secondary | ICD-10-CM | POA: Diagnosis not present

## 2018-02-02 DIAGNOSIS — F0151 Vascular dementia with behavioral disturbance: Secondary | ICD-10-CM | POA: Diagnosis not present

## 2018-02-08 DIAGNOSIS — I87332 Chronic venous hypertension (idiopathic) with ulcer and inflammation of left lower extremity: Secondary | ICD-10-CM | POA: Diagnosis not present

## 2018-02-08 DIAGNOSIS — S81802A Unspecified open wound, left lower leg, initial encounter: Secondary | ICD-10-CM | POA: Diagnosis not present

## 2018-02-08 DIAGNOSIS — I872 Venous insufficiency (chronic) (peripheral): Secondary | ICD-10-CM | POA: Diagnosis not present

## 2018-02-10 DIAGNOSIS — F29 Unspecified psychosis not due to a substance or known physiological condition: Secondary | ICD-10-CM | POA: Diagnosis not present

## 2018-02-22 ENCOUNTER — Encounter (HOSPITAL_BASED_OUTPATIENT_CLINIC_OR_DEPARTMENT_OTHER): Payer: PPO | Attending: Internal Medicine

## 2018-02-22 DIAGNOSIS — I2510 Atherosclerotic heart disease of native coronary artery without angina pectoris: Secondary | ICD-10-CM | POA: Insufficient documentation

## 2018-02-22 DIAGNOSIS — L97821 Non-pressure chronic ulcer of other part of left lower leg limited to breakdown of skin: Secondary | ICD-10-CM | POA: Insufficient documentation

## 2018-02-22 DIAGNOSIS — S81802A Unspecified open wound, left lower leg, initial encounter: Secondary | ICD-10-CM | POA: Diagnosis not present

## 2018-02-22 DIAGNOSIS — F0390 Unspecified dementia without behavioral disturbance: Secondary | ICD-10-CM | POA: Insufficient documentation

## 2018-02-22 DIAGNOSIS — I252 Old myocardial infarction: Secondary | ICD-10-CM | POA: Diagnosis not present

## 2018-02-22 DIAGNOSIS — I87332 Chronic venous hypertension (idiopathic) with ulcer and inflammation of left lower extremity: Secondary | ICD-10-CM | POA: Insufficient documentation

## 2018-02-24 DIAGNOSIS — F29 Unspecified psychosis not due to a substance or known physiological condition: Secondary | ICD-10-CM | POA: Diagnosis not present

## 2018-03-03 DIAGNOSIS — H02834 Dermatochalasis of left upper eyelid: Secondary | ICD-10-CM | POA: Diagnosis not present

## 2018-03-03 DIAGNOSIS — H02831 Dermatochalasis of right upper eyelid: Secondary | ICD-10-CM | POA: Diagnosis not present

## 2018-03-07 ENCOUNTER — Telehealth: Payer: Self-pay | Admitting: Adult Health

## 2018-03-07 NOTE — Telephone Encounter (Signed)
As patient had not presented with the symptoms previously and these are new symptoms for patient, it is recommended for patient to be evaluated in the emergency room to rule out potential stroke.  In 09/2017, patient presented to ED with visual loss and was found to have occipital infarct.  She did not present with any type of weakness or was not found to have weakness at prior appointment.

## 2018-03-07 NOTE — Telephone Encounter (Signed)
Pts daughter Libby Maw [place]) requesting a call stating the pt has not "felt herself", also that she hasn't been picking her left foot up as much as she use too, also that while drinking coffee this morning she noticed the pt wiping her month a lot from spilling it. Please advise.

## 2018-03-08 NOTE — Telephone Encounter (Signed)
Pt daughter(on [name],[name] [phone] ) has called to speak back with RN Katrina.  She is asking for a call

## 2018-03-08 NOTE — Telephone Encounter (Signed)
RN call daughter back about call about her mom dragging her feet not picking it up. [name] [name] stated a nurse was at her moms house and saw her wobbling and drooling.The pt was taking risperidone that was prescribed by the geriatric psychiatrist Dr. Vale Haven.The pt has been seeing him for the past month. The daughter stated since taking the risperidone she was having side effects, and contributed it to that medication.She did call  Dr.Blankmon office to report that. [name] [name] stated she does not think its a stroke to see ED. [name] [name] stated her mom is doing well and the geriatric psych MD is helping a lot. Rn stated last appt was cancel because it was not needed. [name] [name] did not reschedule states mom will be seeing PCP, and geriatric psychiatric.Message will be sent to [facility] NP for [place].

## 2018-03-08 NOTE — Telephone Encounter (Signed)
LEFT detail message for [name] pts daughter. VM was left that per JEssica NP note and review of the chart these are new symptoms with the left foot not picking up. RN left message pt needs to be evaluate at the nearest emergency room to rule out potential stroke. Rn left contact number for daughter to call back if she had any questions.

## 2018-03-09 NOTE — Telephone Encounter (Signed)
Noted! Thank you

## 2018-03-10 DIAGNOSIS — F29 Unspecified psychosis not due to a substance or known physiological condition: Secondary | ICD-10-CM | POA: Diagnosis not present

## 2018-03-10 DIAGNOSIS — I872 Venous insufficiency (chronic) (peripheral): Secondary | ICD-10-CM | POA: Diagnosis not present

## 2018-03-10 DIAGNOSIS — I87332 Chronic venous hypertension (idiopathic) with ulcer and inflammation of left lower extremity: Secondary | ICD-10-CM | POA: Diagnosis not present

## 2018-03-10 DIAGNOSIS — S81802A Unspecified open wound, left lower leg, initial encounter: Secondary | ICD-10-CM | POA: Diagnosis not present

## 2018-03-10 DIAGNOSIS — F0390 Unspecified dementia without behavioral disturbance: Secondary | ICD-10-CM | POA: Diagnosis not present

## 2018-04-21 ENCOUNTER — Encounter (HOSPITAL_COMMUNITY): Payer: Self-pay | Admitting: General Practice

## 2018-04-21 ENCOUNTER — Observation Stay (HOSPITAL_COMMUNITY)
Admission: EM | Admit: 2018-04-21 | Discharge: 2018-04-22 | Disposition: A | Discharge status: Home / Self Care | Payer: PPO | Attending: Internal Medicine | Admitting: Internal Medicine

## 2018-04-21 ENCOUNTER — Emergency Department (HOSPITAL_COMMUNITY): Payer: PPO

## 2018-04-21 ENCOUNTER — Other Ambulatory Visit: Payer: Self-pay

## 2018-04-21 DIAGNOSIS — I2510 Atherosclerotic heart disease of native coronary artery without angina pectoris: Secondary | ICD-10-CM | POA: Diagnosis not present

## 2018-04-21 DIAGNOSIS — I4892 Unspecified atrial flutter: Secondary | ICD-10-CM | POA: Diagnosis not present

## 2018-04-21 DIAGNOSIS — Z85828 Personal history of other malignant neoplasm of skin: Secondary | ICD-10-CM | POA: Insufficient documentation

## 2018-04-21 DIAGNOSIS — I480 Paroxysmal atrial fibrillation: Secondary | ICD-10-CM | POA: Diagnosis not present

## 2018-04-21 DIAGNOSIS — I4891 Unspecified atrial fibrillation: Secondary | ICD-10-CM | POA: Diagnosis not present

## 2018-04-21 DIAGNOSIS — H5440 Blindness, one eye, unspecified eye: Secondary | ICD-10-CM | POA: Diagnosis present

## 2018-04-21 DIAGNOSIS — F0151 Vascular dementia with behavioral disturbance: Secondary | ICD-10-CM | POA: Insufficient documentation

## 2018-04-21 DIAGNOSIS — N390 Urinary tract infection, site not specified: Secondary | ICD-10-CM | POA: Insufficient documentation

## 2018-04-21 DIAGNOSIS — F01518 Vascular dementia, unspecified severity, with other behavioral disturbance: Secondary | ICD-10-CM | POA: Diagnosis present

## 2018-04-21 DIAGNOSIS — H5462 Unqualified visual loss, left eye, normal vision right eye: Secondary | ICD-10-CM | POA: Insufficient documentation

## 2018-04-21 DIAGNOSIS — K219 Gastro-esophageal reflux disease without esophagitis: Secondary | ICD-10-CM | POA: Diagnosis not present

## 2018-04-21 DIAGNOSIS — I639 Cerebral infarction, unspecified: Secondary | ICD-10-CM | POA: Diagnosis not present

## 2018-04-21 DIAGNOSIS — R202 Paresthesia of skin: Secondary | ICD-10-CM | POA: Diagnosis not present

## 2018-04-21 DIAGNOSIS — R54 Age-related physical debility: Secondary | ICD-10-CM | POA: Insufficient documentation

## 2018-04-21 DIAGNOSIS — I252 Old myocardial infarction: Secondary | ICD-10-CM | POA: Insufficient documentation

## 2018-04-21 DIAGNOSIS — R569 Unspecified convulsions: Secondary | ICD-10-CM | POA: Diagnosis not present

## 2018-04-21 DIAGNOSIS — R531 Weakness: Secondary | ICD-10-CM | POA: Diagnosis not present

## 2018-04-21 DIAGNOSIS — I959 Hypotension, unspecified: Secondary | ICD-10-CM | POA: Diagnosis not present

## 2018-04-21 DIAGNOSIS — I10 Essential (primary) hypertension: Secondary | ICD-10-CM | POA: Diagnosis not present

## 2018-04-21 DIAGNOSIS — Z7902 Long term (current) use of antithrombotics/antiplatelets: Secondary | ICD-10-CM | POA: Insufficient documentation

## 2018-04-21 DIAGNOSIS — H5340 Unspecified visual field defects: Secondary | ICD-10-CM | POA: Diagnosis present

## 2018-04-21 DIAGNOSIS — R4020 Unspecified coma: Secondary | ICD-10-CM | POA: Diagnosis not present

## 2018-04-21 DIAGNOSIS — I69320 Aphasia following cerebral infarction: Secondary | ICD-10-CM | POA: Diagnosis not present

## 2018-04-21 DIAGNOSIS — R000 Tachycardia, unspecified: Secondary | ICD-10-CM | POA: Diagnosis not present

## 2018-04-21 DIAGNOSIS — R0789 Other chest pain: Secondary | ICD-10-CM | POA: Diagnosis present

## 2018-04-21 HISTORY — DX: Unspecified atrial fibrillation: I48.91

## 2018-04-21 LAB — CBC WITH DIFFERENTIAL/PLATELET
ABS IMMATURE GRANULOCYTES: 0.01 10*3/uL (ref 0.00–0.07)
BASOS ABS: 0.1 10*3/uL (ref 0.0–0.1)
Basophils Relative: 1 %
Eosinophils Absolute: 0.1 10*3/uL (ref 0.0–0.5)
Eosinophils Relative: 1 %
HCT: 45.3 % (ref 36.0–46.0)
HEMOGLOBIN: 14.5 g/dL (ref 12.0–15.0)
Immature Granulocytes: 0 %
LYMPHS ABS: 1.3 10*3/uL (ref 0.7–4.0)
LYMPHS PCT: 18 %
MCH: 30.7 pg (ref 26.0–34.0)
MCHC: 32 g/dL (ref 30.0–36.0)
MCV: 95.8 fL (ref 80.0–100.0)
MONO ABS: 0.7 10*3/uL (ref 0.1–1.0)
Monocytes Relative: 10 %
NEUTROS ABS: 4.9 10*3/uL (ref 1.7–7.7)
Neutrophils Relative %: 70 %
Platelets: 210 10*3/uL (ref 150–400)
RBC: 4.73 MIL/uL (ref 3.87–5.11)
RDW: 13.1 % (ref 11.5–15.5)
WBC: 7 10*3/uL (ref 4.0–10.5)
nRBC: 0 % (ref 0.0–0.2)

## 2018-04-21 LAB — BASIC METABOLIC PANEL
Anion gap: 8 (ref 5–15)
BUN: 21 mg/dL (ref 8–23)
CO2: 26 mmol/L (ref 22–32)
Calcium: 8.9 mg/dL (ref 8.9–10.3)
Chloride: 104 mmol/L (ref 98–111)
Creatinine, Ser: 0.95 mg/dL (ref 0.44–1.00)
GFR calc Af Amer: >60 mL/min (ref >60)
GFR, EST NON AFRICAN AMERICAN: 52 mL/min — AB (ref >60)
Glucose, Bld: 139 mg/dL — ABNORMAL HIGH (ref 70–99)
Potassium: 4.1 mmol/L (ref 3.5–5.1)
Sodium: 138 mmol/L (ref 135–145)

## 2018-04-21 LAB — MAGNESIUM: Magnesium: 2.2 mg/dL (ref 1.7–2.4)

## 2018-04-21 LAB — URINALYSIS, ROUTINE W REFLEX MICROSCOPIC
Bilirubin Urine: NEGATIVE
Glucose, UA: NEGATIVE mg/dL
Hgb urine dipstick: NEGATIVE
Ketones, ur: NEGATIVE mg/dL
Nitrite: NEGATIVE
Protein, ur: NEGATIVE mg/dL
Specific Gravity, Urine: 1.009 (ref 1.005–1.030)
pH: 6 (ref 5.0–8.0)

## 2018-04-21 LAB — I-STAT TROPONIN, ED: Troponin i, poc: 0.06 ng/mL (ref 0.00–0.08)

## 2018-04-21 MED ORDER — ALBUTEROL SULFATE (2.5 MG/3ML) 0.083% IN NEBU: 2.5000 mg | INHALATION_SOLUTION | RESPIRATORY_TRACT | Status: DC | PRN

## 2018-04-21 MED ORDER — DILTIAZEM HCL 30 MG PO TABS
30.0000 mg | ORAL_TABLET | Freq: Four times a day (QID) | ORAL | Status: DC
  Administered 2018-04-21 – 2018-04-22 (×3): 30 mg via ORAL
  Filled 2018-04-21 (×4): qty 1

## 2018-04-21 MED ORDER — ONDANSETRON HCL 4 MG/2ML IJ SOLN: 4.0000 mg | Freq: Four times a day (QID) | INTRAMUSCULAR | Status: DC | PRN

## 2018-04-21 MED ORDER — CLONAZEPAM 0.25 MG PO TBDP
0.2500 mg | ORAL_TABLET | Freq: Every day | ORAL | Status: DC
  Administered 2018-04-22: 0.25 mg via ORAL
  Filled 2018-04-21: qty 1

## 2018-04-21 MED ORDER — SODIUM CHLORIDE 0.9% FLUSH
3.0000 mL | Freq: Two times a day (BID) | INTRAVENOUS | Status: DC
  Administered 2018-04-21 – 2018-04-22 (×2): 3 mL via INTRAVENOUS

## 2018-04-21 MED ORDER — CLONAZEPAM 0.5 MG PO TABS: 0.2500 mg | ORAL_TABLET | Freq: Every day | ORAL | Status: DC | PRN

## 2018-04-21 MED ORDER — NITROGLYCERIN 0.4 MG SL SUBL: 0.4000 mg | SUBLINGUAL_TABLET | SUBLINGUAL | Status: DC | PRN

## 2018-04-21 MED ORDER — TRAZODONE HCL 50 MG PO TABS
25.0000 mg | ORAL_TABLET | Freq: Every day | ORAL | Status: DC
  Administered 2018-04-21: 25 mg via ORAL
  Filled 2018-04-21: qty 1

## 2018-04-21 MED ORDER — SODIUM CHLORIDE 0.9% FLUSH: 3.0000 mL | INTRAVENOUS | Status: DC | PRN

## 2018-04-21 MED ORDER — CLONAZEPAM 0.25 MG PO TBDP
0.5000 mg | ORAL_TABLET | Freq: Every day | ORAL | Status: DC
  Administered 2018-04-21: 0.5 mg via ORAL
  Filled 2018-04-21: qty 2

## 2018-04-21 MED ORDER — CLONAZEPAM 0.5 MG PO TABS: 0.2500 mg | ORAL_TABLET | Freq: Every day | ORAL | Status: DC

## 2018-04-21 MED ORDER — POLYETHYLENE GLYCOL 3350 17 G PO PACK: 17.0000 g | PACK | Freq: Every day | ORAL | Status: DC | PRN

## 2018-04-21 MED ORDER — CLONAZEPAM 0.5 MG PO TABS: 0.2500 mg | ORAL_TABLET | Freq: Two times a day (BID) | ORAL | Status: DC | PRN

## 2018-04-21 MED ORDER — DONEPEZIL HCL 5 MG PO TABS
7.5000 mg | ORAL_TABLET | Freq: Every day | ORAL | Status: DC
  Administered 2018-04-22: 7.5 mg via ORAL
  Filled 2018-04-21: qty 2

## 2018-04-21 MED ORDER — SODIUM CHLORIDE 0.9 % IV SOLN
250.0000 mL | INTRAVENOUS | Status: DC | PRN
  Administered 2018-04-22: 250 mL via INTRAVENOUS

## 2018-04-21 MED ORDER — METOPROLOL TARTRATE 5 MG/5ML IV SOLN
2.5000 mg | INTRAVENOUS | Status: DC | PRN
  Administered 2018-04-21: 2.5 mg via INTRAVENOUS
  Filled 2018-04-21: qty 5

## 2018-04-21 MED ORDER — SODIUM CHLORIDE 0.9 % IV BOLUS
1000.0000 mL | Freq: Once | INTRAVENOUS | Status: AC
  Administered 2018-04-21: 1000 mL via INTRAVENOUS

## 2018-04-21 MED ORDER — ENOXAPARIN SODIUM 40 MG/0.4ML ~~LOC~~ SOLN
40.0000 mg | SUBCUTANEOUS | Status: DC
  Administered 2018-04-21: 40 mg via SUBCUTANEOUS
  Filled 2018-04-21: qty 0.4

## 2018-04-21 MED ORDER — CLOPIDOGREL BISULFATE 75 MG PO TABS
75.0000 mg | ORAL_TABLET | Freq: Every day | ORAL | Status: DC
  Administered 2018-04-22: 75 mg via ORAL
  Filled 2018-04-21: qty 1

## 2018-04-21 MED ORDER — RISPERIDONE 1 MG PO TABS
2.0000 mg | ORAL_TABLET | Freq: Every day | ORAL | Status: DC
  Administered 2018-04-21: 2 mg via ORAL
  Filled 2018-04-21: qty 2

## 2018-04-21 MED ORDER — SERTRALINE HCL 50 MG PO TABS
150.0000 mg | ORAL_TABLET | Freq: Every day | ORAL | Status: DC
  Administered 2018-04-22: 150 mg via ORAL
  Filled 2018-04-21: qty 1

## 2018-04-21 MED ORDER — CLONAZEPAM 0.25 MG PO TBDP: 0.2500 mg | ORAL_TABLET | Freq: Every day | ORAL | Status: DC | PRN

## 2018-04-21 MED ORDER — ONDANSETRON HCL 4 MG PO TABS: 4.0000 mg | ORAL_TABLET | Freq: Four times a day (QID) | ORAL | Status: DC | PRN

## 2018-04-21 MED ORDER — PREDNISOLONE ACETATE 1 % OP SUSP
1.0000 [drp] | Freq: Every day | OPHTHALMIC | Status: DC
  Administered 2018-04-21: 1 [drp] via OPHTHALMIC
  Filled 2018-04-21: qty 5

## 2018-04-21 MED ORDER — CLONAZEPAM 0.5 MG PO TABS: 0.5000 mg | ORAL_TABLET | Freq: Every day | ORAL | Status: DC

## 2018-04-21 NOTE — Progress Notes (Signed)
Patient arrived to unit

## 2018-04-21 NOTE — ED Provider Notes (Signed)
[facility] EMERGENCY DEPARTMENT Provider Note   CSN: [id number] Arrival date & time: 04/21/18  1122     History   Chief Complaint Chief Complaint  Patient presents with  . Tachycardia    HPI [name] [name] is a 85 y.o. female.  Patient is here with caregiver who states that she has been tachycardic over the last 2 days.  Has a history of SVT.  Patient has worn a loop recorder in the past but no longer does the loop recorder transmit.  She has been complaining of pain throughout.  Patient is demented.  Denies any chest pain, shortness of breath upon my evaluation.  No infectious symptoms, no fever at home.  The history is provided by a caregiver.  Illness  Location:  General Severity:  Mild Onset quality:  Gradual Timing:  Constant Progression:  Unchanged Chronicity:  Recurrent Associated symptoms: chest pain   Associated symptoms: no abdominal pain, no cough, no ear pain, no fever, no rash, no shortness of breath, no sore throat and no vomiting     Past Medical History:  Diagnosis Date  . Arthritis    "thumbs, joints" (03/23/2017  . Basal cell carcinoma    "several; scattered over my face, hands, leg some cut off; some burned off"  . Blind left eye    a. Corneal transplant x3 with rejection  . CAD (coronary artery disease)    a. s/p NSTEMI 2015 treated conservatively; nuclear stress test low risk. b. Continued angina despite med rx 07/2015 - s/p Bio Freedom Stent to ramus intermedius with mod LAD stenosis neg by FFR. // Nuc study 5/19:  EF 67, no ischemia or scar; Low Risk   . Complication of anesthesia    "very sensitive to RX"  . Diverticulitis large intestine   . Diverticulosis   . Family history of adverse reaction to anesthesia    "we all get PONV" (1/1/201)  . Gastritis   . GERD (gastroesophageal reflux disease)   . Hayfever   . Helicobacter pylori (H. pylori) 05/22/02   RUT-Positive  . Hypertension   . Myocardial infarction ([place]) 2015  .  Squamous carcinoma    "several; scattered over my face, hands, leg some cut off; some burned off"  . Stroke [facility]) 2015   denies residual on 08/07/2015  . Stroke [facility]) 03/18/2017   "has effected her vision, balance, some memory" (03/23/2017)  . SVT (supraventricular tachycardia) (HCC)    a. seen by Dr. Caryl Comes - has loop recorder in. Patient has declined amiodarone due to side effect profile  . Varicose veins     Patient Active Problem List   Diagnosis Date Noted  . Atrial fibrillation with RVR ([place]) 04/21/2018  . Vascular dementia with behavior disturbance ([place])   . Epigastric pain 11/18/2017  . Belching 11/18/2017  . Diastolic dysfunction   . Hypokalemia   . Seizures ([place])   . PAF (paroxysmal atrial fibrillation) ([place])   . Cerebral embolism with cerebral infarction 09/21/2017  . Acute CVA (cerebrovascular accident) ([place]) 09/21/2017  . Blind 04/02/2017  . Visual field cut- right 04/02/2017  . Gastroesophageal reflux disease   . Pre-diabetes   . Blind left eye- (Fuch's disease) 03/05/2015  . Essential hypertension 12/19/2014  . CAD (coronary artery disease) 12/19/2014  . History of Rt brain stroke Oct 2015 06/14/2014  . Headache 06/14/2014  . Malignant hypertension 01/20/2014  . Aphasia 01/04/2014  . Paresthesia 01/04/2014  . Generalized abdominal pain 01/01/2014  . History of diverticulitis  01/01/2014  . Diaphoresis 12/29/2012  . Sleep disturbance 10/03/2012  . Depression 07/12/2012  . OA (osteoarthritis) of knee 06/27/2012  . History of PSVT 12/15/2011    Past Surgical History:  Procedure Laterality Date  . BASAL CELL CARCINOMA EXCISION     "several; scattered over my face, hands, leg some cut off; some burned off"  . CARDIAC CATHETERIZATION N/A 08/07/2015   Procedure: Left Heart Cath and Coronary Angiography;  Surgeon: [name], MD;  Location: [facility] CV LAB;  Service: Cardiovascular;  Laterality: N/A;  . CARDIAC CATHETERIZATION N/A 08/07/2015   Procedure:  Coronary Stent Intervention;  Surgeon: [name], MD;  Location: [facility] CV LAB;  Service: Cardiovascular;  Laterality: N/A;  . CARDIAC CATHETERIZATION N/A 08/07/2015   Procedure: Intravascular Pressure Wire/FFR Study;  Surgeon: [name], MD;  Location: [facility] CV LAB;  Service: Cardiovascular;  Laterality: N/A;  . CATARACT EXTRACTION W/ INTRAOCULAR LENS  IMPLANT, BILATERAL Bilateral 2005  . CLOSED REDUCTION SHOULDER DISLOCATION Left 2016 X 2  . CORNEAL TRANSPLANT Bilateral right 2009, left 2009    3 in left eye (last 2 failed), 1 in right eye  . DILATION AND CURETTAGE OF UTERUS    . EYE SURGERY    . JOINT REPLACEMENT    . [place]   "had gangrene in it"  . LOOP RECORDER IMPLANT N/A 01/05/2014   Procedure: LOOP RECORDER IMPLANT;  Surgeon: Coralyn Mark, MD;  Location: [facility] CATH LAB;  Service: Cardiovascular;  Laterality: N/A;  . SQUAMOUS CELL CARCINOMA EXCISION     "several; scattered over my face, hands, leg some cut off; some burned off"  . TONSILLECTOMY    . TOTAL ABDOMINAL HYSTERECTOMY  11/1983   "ovaries and all"  . TOTAL KNEE ARTHROPLASTY Left 06/27/2012   Procedure: LEFT TOTAL KNEE ARTHROPLASTY;  Surgeon: Gearlean Alf, MD;  Location: [facility];  Service: Orthopedics;  Laterality: Left;  . TOTAL KNEE ARTHROPLASTY Right 12/12/2012   Procedure: RIGHT TOTAL KNEE ARTHROPLASTY;  Surgeon: Gearlean Alf, MD;  Location: [facility];  Service: Orthopedics;  Laterality: Right;  . TUBAL LIGATION  1966     OB History   No obstetric history on file.      Home Medications    Prior to Admission medications   Medication Sig Start Date End Date Taking? Authorizing Provider  clonazePAM (KLONOPIN) 0.5 MG tablet Take 0.25-5 mg by mouth See admin instructions. Take 1/2 tablet (0.25mg ) in the morning, and 1 tablet (.5mg ) at night May take additional 1/2 tablet (.25mg ) in the afternoon if needed 03/10/18  Yes [provider]  clopidogrel (PLAVIX) 75 MG  tablet Take 1 tablet (75 mg total) by mouth daily. Patient taking differently: Take 75 mg by mouth every morning.  12/30/17  Yes [name], MD  donepezil (ARICEPT) 5 MG tablet Take 7.5 mg by mouth every morning.  01/13/18  Yes [provider]  nitroGLYCERIN (NITROSTAT) 0.4 MG SL tablet Place 1 tablet (0.4 mg total) under the tongue every 5 (five) minutes as needed for chest pain. 08/08/15  Yes Rosita Fire, Brittainy M, PA-C  prednisoLONE acetate (PRED FORTE) 1 % ophthalmic suspension Place 1 drop into both eyes See admin instructions. 1 drop into both eyes at bedtime spaced 5 minutes apart from Systane gel   Yes [provider]  risperiDONE (RISPERDAL) 2 MG tablet Take 2 mg by mouth at bedtime.  01/13/18  Yes [provider]  sertraline (ZOLOFT) 100 MG tablet Take 150 mg  by mouth every morning.  10/28/17  Yes [provider]  traZODone (DESYREL) 50 MG tablet Take 25 mg by mouth at bedtime. 03/10/18  Yes [provider]    Family History Family History  Problem Relation Age of Onset  . Stroke Mother   . Heart attack Father   . Heart attack Brother   . Cancer Brother   . Heart attack Brother   . Heart attack Brother   . Heart attack Brother   . Parkinsonism Brother     Social History Social History   Tobacco Use  . Smoking status: Never Smoker  . Smokeless tobacco: Never Used  Substance Use Topics  . Alcohol use: No  . Drug use: No     Allergies   Fluorescein; Hydralazine hcl; Sulfa antibiotics; Ultram [tramadol]; Yellow dye; Buprenex [buprenorphine hcl]; Keflex [cephalexin]; Lipitor [atorvastatin]; Restasis [cyclosporine]; Augmentin [amoxicillin-pot clavulanate]; Levofloxacin; Morphine and related; Percocet [oxycodone-acetaminophen]; Tape; and Zetia [ezetimibe]   Review of Systems Review of Systems  Constitutional: Negative for chills and fever.  HENT: Negative for ear pain and sore throat.   Eyes: Negative for pain and visual  disturbance.  Respiratory: Negative for cough and shortness of breath.   Cardiovascular: Positive for chest pain. Negative for palpitations.  Gastrointestinal: Negative for abdominal pain and vomiting.  Genitourinary: Negative for dysuria and hematuria.  Musculoskeletal: Positive for arthralgias. Negative for back pain.  Skin: Negative for color change and rash.  Neurological: Negative for seizures and syncope.  All other systems reviewed and are negative.    Physical Exam Updated Vital Signs BP (!) 106/94   Pulse (!) 146   Temp (!) 97.5 F (36.4 C) (Oral)   Resp 13   SpO2 93%   Physical Exam Vitals signs and nursing note reviewed.  Constitutional:      General: She is not in acute distress.    Appearance: She is well-developed.  HENT:     Head: Normocephalic and atraumatic.     Nose: Nose normal.     Mouth/Throat:     Mouth: Mucous membranes are moist.  Eyes:     Extraocular Movements: Extraocular movements intact.     Conjunctiva/sclera: Conjunctivae normal.     Pupils: Pupils are equal, round, and reactive to light.  Neck:     Musculoskeletal: Normal range of motion and neck supple.  Cardiovascular:     Rate and Rhythm: Tachycardia present. Rhythm irregular.     Heart sounds: No murmur.  Pulmonary:     Effort: Pulmonary effort is normal. No respiratory distress.     Breath sounds: Normal breath sounds.  Abdominal:     Palpations: Abdomen is soft.     Tenderness: There is no abdominal tenderness.  Skin:    General: Skin is warm and dry.  Neurological:     General: No focal deficit present.     Mental Status: She is alert. Mental status is at baseline.     Cranial Nerves: No cranial nerve deficit.     Sensory: No sensory deficit.     Motor: No weakness.  Psychiatric:        Mood and Affect: Mood normal.      ED Treatments / Results  Labs (all labs ordered are listed, but only abnormal results are displayed) Labs Reviewed  BASIC METABOLIC PANEL -  Abnormal; Notable for the following components:      Result Value   Glucose, Bld 139 (*)    GFR calc non Af Amer 52 (*)  All other components within normal limits  URINALYSIS, ROUTINE W REFLEX MICROSCOPIC - Abnormal; Notable for the following components:   Leukocytes, UA MODERATE (*)    Bacteria, UA RARE (*)    All other components within normal limits  URINE CULTURE  CBC WITH DIFFERENTIAL/PLATELET  MAGNESIUM  I-STAT TROPONIN, ED    EKG EKG Interpretation  Date/Time:  Thursday April 21 2018 11:31:29 EST Ventricular Rate:  140 PR Interval:    QRS Duration: 94 QT Interval:  351 QTC Calculation: 536 R Axis:   107 Text Interpretation:  Atrial flutter with predominant 2:1 AV block Probable lateral infarct, age indeterminate Prolonged QT interval Confirmed by Lennice Sites [phone]) on 04/21/2018 11:56:41 AM   Radiology Ct Head Wo Contrast  Result Date: 04/21/2018 CLINICAL DATA:  Altered level of consciousness, history stroke, coronary artery disease post and high, hypertension, blindness LEFT eye EXAM: CT HEAD WITHOUT CONTRAST TECHNIQUE: Contiguous axial images were obtained from the base of the skull through the vertex without intravenous contrast. Sagittal and coronal MPR images reconstructed from axial data set. COMPARISON:  01/15/2018 FINDINGS: Brain: Generalized atrophy. Normal ventricular morphology. No midline shift or mass effect. Small vessel chronic ischemic changes of deep cerebral white matter. Large old LEFT temporoparietal infarct. Old RIGHT occipital infarct. Old RIGHT thalamic infarct. No intracranial hemorrhage, mass lesion, or evidence of acute infarction. No extra-axial fluid collections. Vascular: Minimal atherosclerotic calcifications of internal carotid arteries at skull base. No hyperdense vessels. Skull: Intact Sinuses/Orbits: Clear Other: N/A IMPRESSION: Atrophy with small vessel chronic ischemic changes of deep cerebral white matter. Old LEFT temporoparietal,  RIGHT occipital and RIGHT thalamic infarcts. No acute intracranial abnormalities. Electronically Signed   By: Lavonia Dana M.D.   On: 04/21/2018 14:37   Dg Chest Portable 1 View  Result Date: 04/21/2018 CLINICAL DATA:  Tachycardia EXAM: PORTABLE CHEST 1 VIEW COMPARISON:  December 13, 2017 FINDINGS: The heart size and mediastinal contours are stable. The heart size is enlarged. The aorta is tortuous. Both lungs are clear. The visualized skeletal structures are stable. IMPRESSION: No active cardiopulmonary disease.  Cardiomegaly. Electronically Signed   By: Abelardo Diesel M.D.   On: 04/21/2018 13:11    Procedures Procedures (including critical care time)  Medications Ordered in ED Medications  diltiazem (CARDIZEM) tablet 30 mg (has no administration in time range)  sodium chloride 0.9 % bolus 1,000 mL (0 mLs Intravenous Stopped 04/21/18 1412)     Initial Impression / Assessment and Plan / ED Course  I have reviewed the triage vital signs and the nursing notes.  Pertinent labs & imaging results that were available during my care of the patient were reviewed by me and considered in my medical decision making (see chart for details).     [name] [name] is a 85 year old female with history of SVT, CAD, stroke, dementia who presents to the ED with tachycardia.  Patient with tachycardia in the 140s upon my evaluation.  Patient otherwise with unremarkable vitals.  Initial EKG shows likely atrial flutter with RVR.  However telemetry in the room shows some episodes of possibly atrial fibrillation versus SVT.  She has had some chest pain, body aches but otherwise denies any urinary symptoms.  Appears to be neurologically at her baseline.  Cardiology consulted for evaluation and management.  She has been on amiodarone in the past for SVT/atrial fibrillation.  She used to be on Eliquis but had a GI bleed and is no longer a candidate for anticoagulation.  Will evaluate to look for source  of possibly driving  this process.  Urinalysis overall unremarkable.  No significant anemia, electrolyte abnormality, kidney injury.  Electrolytes within normal limits.  Chest x-ray showed no signs of pneumonia, pneumothorax, pleural effusion.  Troponin within normal limits.  Urine culture added.  CT scan of the head showed no acute findings.  Patient was seen by cardiology and they will start oral diltiazem.  Patient to be admitted to hospitalist for further care.  Hemodynamically stable throughout my care.  This chart was dictated using voice recognition software.  Despite best efforts to proofread,  errors can occur which can change the documentation meaning.    Final Clinical Impressions(s) / ED Diagnoses   Final diagnoses:  Atrial flutter, paroxysmal [facility])    ED Discharge Orders    None       Lennice Sites, DO 04/21/18 1635

## 2018-04-21 NOTE — ED Notes (Signed)
Dr. Ronnald Nian aware of hr 120-140's ; per md ,  Cardiology will see patient and decide course of treatment

## 2018-04-21 NOTE — ED Triage Notes (Signed)
Pt BIB [facility] for eval of R leg/arm tingling onset 1000. Pt w/ hx of multip CVA, last in June of 2019. Known blindness s/p CVA. Pt endorses R leg/arm pain on arrival "but it is better" EMS reports negative stroke scale, no new neurologic deficits at this time. Pt is extremely HOH.

## 2018-04-21 NOTE — Consult Note (Addendum)
Cardiology Consultation:   Patient ID: [name] [name] MRN: [id number]; DOB: [birth year]  Admit date: 04/21/2018 Date of Consult: 04/21/2018  Primary Care Provider: Darcus Austin, MD (Inactive) Primary Cardiologist: [name], MD  Primary Electrophysiologist:  None    Patient Profile:   [name] [name] is a 85 y.o. female with a hx of CAD and NSTEMI 2015 (medical management), LHC 07/2015 with stent to RI, negative FFR of moderately diseased LAD with recommendations to continue plavix indefinitely, low risk NST 07/2017 with continued angina, labile HTN, SVT, and multiple CVAs with left eye blindness who is being seen today for the evaluation of chest pain and Aflutter at the request of Dr. Ronnald Nian.  History of Present Illness:   [name] [name] was last seen by Dr. Burt Knack in clinic 11/2017 as hospital follow up after her large stroke in July 2019 and prolonged hospital course. At that visit, she was unable to communicate and was being cared for 24/7. Norvasc was added to her regimen as her daughter reported high systolic pressures in the 200s, at times. She has a history of SVT with previous loop recorder in place (followed by Dr. Caryl Comes). At last interrogation 08/10/17, she continued to have bouts of symptomatic SVT, but no Afib was detected (ILR at end of life).   She presents to [facility] today with onset of right arm and leg tingling, which was improved on arrival. EMS reported negative stroke scale. Daughter was not at bedside during my exam. Head CT pending. Pt dementia prevents her from communicating well with this provider. Telemetry with what appears to be atrial flutter in the 120-140s. Home medications include norvasc, 0.2 mg clonidine patch, plavix, and bystolic.   On re-examination, daughter at bedside states pt had chest pain last evening and was given nitro x 1 and pt states chest pain was relieved. Pt slept well overnight, but was lethargic and difficult to wake this morning. She had hypotension  and noted to have a fast hear rate and was brought to the ER by her daughter.    Past Medical History:  Diagnosis Date  . Arthritis    "thumbs, joints" (03/23/2017  . Basal cell carcinoma    "several; scattered over my face, hands, leg some cut off; some burned off"  . Blind left eye    a. Corneal transplant x3 with rejection  . CAD (coronary artery disease)    a. s/p NSTEMI 2015 treated conservatively; nuclear stress test low risk. b. Continued angina despite med rx 07/2015 - s/p Bio Freedom Stent to ramus intermedius with mod LAD stenosis neg by FFR. // Nuc study 5/19:  EF 67, no ischemia or scar; Low Risk   . Complication of anesthesia    "very sensitive to RX"  . Diverticulitis large intestine   . Diverticulosis   . Family history of adverse reaction to anesthesia    "we all get PONV" (1/1/201)  . Gastritis   . GERD (gastroesophageal reflux disease)   . Hayfever   . Helicobacter pylori (H. pylori) 05/22/02   RUT-Positive  . Hypertension   . Myocardial infarction ([place]) 2015  . Squamous carcinoma    "several; scattered over my face, hands, leg some cut off; some burned off"  . Stroke [facility]) 2015   denies residual on 08/07/2015  . Stroke [facility]) 03/18/2017   "has effected her vision, balance, some memory" (03/23/2017)  . SVT (supraventricular tachycardia) (HCC)    a. seen by Dr. Caryl Comes - has loop recorder in. Patient has declined amiodarone  due to side effect profile  . Varicose veins     Past Surgical History:  Procedure Laterality Date  . BASAL CELL CARCINOMA EXCISION     "several; scattered over my face, hands, leg some cut off; some burned off"  . CARDIAC CATHETERIZATION N/A 08/07/2015   Procedure: Left Heart Cath and Coronary Angiography;  Surgeon: [name], MD;  Location: [place] CV LAB;  Service: Cardiovascular;  Laterality: N/A;  . CARDIAC CATHETERIZATION N/A 08/07/2015   Procedure: Coronary Stent Intervention;  Surgeon: [name], MD;  Location: [place] CV  LAB;  Service: Cardiovascular;  Laterality: N/A;  . CARDIAC CATHETERIZATION N/A 08/07/2015   Procedure: Intravascular Pressure Wire/FFR Study;  Surgeon: [name], MD;  Location: [place] CV LAB;  Service: Cardiovascular;  Laterality: N/A;  . CATARACT EXTRACTION W/ INTRAOCULAR LENS  IMPLANT, BILATERAL Bilateral 2005  . CLOSED REDUCTION SHOULDER DISLOCATION Left 2016 X 2  . CORNEAL TRANSPLANT Bilateral right 2009, left 2009    3 in left eye (last 2 failed), 1 in right eye  . DILATION AND CURETTAGE OF UTERUS    . EYE SURGERY    . JOINT REPLACEMENT    . [place]   "had gangrene in it"  . LOOP RECORDER IMPLANT N/A 01/05/2014   Procedure: LOOP RECORDER IMPLANT;  Surgeon: Coralyn Mark, MD;  Location: [place] CATH LAB;  Service: Cardiovascular;  Laterality: N/A;  . SQUAMOUS CELL CARCINOMA EXCISION     "several; scattered over my face, hands, leg some cut off; some burned off"  . TONSILLECTOMY    . TOTAL ABDOMINAL HYSTERECTOMY  11/1983   "ovaries and all"  . TOTAL KNEE ARTHROPLASTY Left 06/27/2012   Procedure: LEFT TOTAL KNEE ARTHROPLASTY;  Surgeon: Gearlean Alf, MD;  Location: [facility];  Service: Orthopedics;  Laterality: Left;  . TOTAL KNEE ARTHROPLASTY Right 12/12/2012   Procedure: RIGHT TOTAL KNEE ARTHROPLASTY;  Surgeon: Gearlean Alf, MD;  Location: [facility];  Service: Orthopedics;  Laterality: Right;  . TUBAL LIGATION  1966     Home Medications:  Prior to Admission medications   Medication Sig Start Date End Date Taking? Authorizing Provider  ALPRAZolam (XANAX) 0.5 MG tablet Take 0.25-0.5 mg by mouth every 8 (eight) hours as needed. 12/10/17   [provider]  amLODipine (NORVASC) 5 MG tablet Take 1 tablet (5 mg total) by mouth daily. 12/01/17 11/26/18  [name], MD  cloNIDine (CATAPRES - DOSED IN MG/24 HR) 0.2 mg/24hr patch PLACE 1 PATCH ONTO THE SKIN ONCE A WEEK. 01/20/18   [name], MD  clopidogrel (PLAVIX) 75 MG tablet Take 1 tablet (75  mg total) by mouth daily. 12/30/17   [name], MD  divalproex (DEPAKOTE ER) 250 MG 24 hr tablet Please give 2 tablets (500mg ) in the morning and 1 tablet (250mg ) in the evening Patient taking differently: Take 250 mg by mouth 4 (four) times daily.  12/06/17   Venancio Poisson, NP  donepezil (ARICEPT) 5 MG tablet Take 5 mg by mouth daily. 01/13/18   [provider]  nebivolol (BYSTOLIC) 2.5 MG tablet Take 1 tablet (2.5 mg total) by mouth 2 (two) times daily. 08/10/17   Richardson Dopp T, PA-C  nitroGLYCERIN (NITROSTAT) 0.4 MG SL tablet Place 1 tablet (0.4 mg total) under the tongue every 5 (five) minutes as needed for chest pain. 08/08/15   Lyda Jester M, PA-C  pantoprazole (PROTONIX) 40 MG tablet Take 1 tablet (40 mg total) by mouth 2 (two) times daily before  a meal. 11/18/17   Zehr, Janett Billow D, PA-C  prednisoLONE acetate (PRED FORTE) 1 % ophthalmic suspension Place 1 drop into both eyes See admin instructions. 1 drop into both eyes at bedtime spaced 5 minutes apart from Systane gel    [provider]  risperiDONE (RISPERDAL) 0.5 MG tablet Take 0.5 mg by mouth 3 (three) times daily. 01/13/18   [provider]  sertraline (ZOLOFT) 50 MG tablet Take 50 mg by mouth daily.  10/28/17   [provider]    Inpatient Medications: Scheduled Meds:  Continuous Infusions: . sodium chloride 1,000 mL (04/21/18 1311)   PRN Meds:   Allergies:    Allergies  Allergen Reactions  . Fluorescein Anaphylaxis, Shortness Of Breath, Itching and Swelling  . Hydralazine Hcl Anaphylaxis, Swelling, Palpitations and Rash  . Sulfa Antibiotics Rash and Other (See Comments)    Other Reaction: red bumps  . Ultram [Tramadol] Hives  . Yellow Dye Anaphylaxis, Itching, Swelling and Other (See Comments)    Fluorescein (in eye drops)  . Buprenex [Buprenorphine Hcl] Palpitations and Other (See Comments)  . Keflex [Cephalexin] Diarrhea  . Lipitor [Atorvastatin] Other (See Comments)      Muscle weakness  . Restasis [Cyclosporine] Swelling    redness of face with slight swelling   . Augmentin [Amoxicillin-Pot Clavulanate] Nausea And Vomiting  . Levofloxacin Other (See Comments)    Reaction not recalled  . Morphine And Related Other (See Comments)    agitation   . Percocet [Oxycodone-Acetaminophen] Nausea And Vomiting  . Tape Other (See Comments)    Adhesive tape pulls off the skin Able to tolerate paper tape  . Zetia [Ezetimibe] Other (See Comments)    Myalgias    Social History:   Social History   Socioeconomic History  . Marital status: Widowed    Spouse name: Not on file  . Number of children: 3  . Years of education: Some college  . Highest education level: Not on file  Occupational History  . Occupation: Retired  Scientific laboratory technician  . Financial resource strain: Not on file  . Food insecurity:    Worry: Not on file    Inability: Not on file  . Transportation needs:    Medical: Not on file    Non-medical: Not on file  Tobacco Use  . Smoking status: Never Smoker  . Smokeless tobacco: Never Used  Substance and Sexual Activity  . Alcohol use: No  . Drug use: No  . Sexual activity: Never  Lifestyle  . Physical activity:    Days per week: Not on file    Minutes per session: Not on file  . Stress: Not on file  Relationships  . Social connections:    Talks on phone: Not on file    Gets together: Not on file    Attends religious service: Not on file    Active member of club or organization: Not on file    Attends meetings of clubs or organizations: Not on file    Relationship status: Not on file  . Intimate partner violence:    Fear of current or ex partner: Not on file    Emotionally abused: Not on file    Physically abused: Not on file    Forced sexual activity: Not on file  Other Topics Concern  . Not on file  Social History Narrative   Patient is right handed   Patient lives alone.   Patient drinks 1 cup of coffee daily.    Family  History:    Family History  Problem Relation Age of Onset  . Stroke Mother   . Heart attack Father   . Heart attack Brother   . Cancer Brother   . Heart attack Brother   . Heart attack Brother   . Heart attack Brother   . Parkinsonism Brother      ROS:  Please see the history of present illness.   All other ROS reviewed and negative.     Physical Exam/Data:   Vitals:   04/21/18 1125 04/21/18 1129 04/21/18 1300  BP:  98/66 111/63  Pulse:  (!) 142 (!) 136  Resp:  19 17  Temp:  (!) 97.5 F (36.4 C)   TempSrc:  Oral   SpO2: 99% 91% 92%   No intake or output data in the 24 hours ending 04/21/18 1348 Last 3 Weights 12/01/2017 11/18/2017 11/02/2017  Weight (lbs) 168 lb 3.2 oz 170 lb 166 lb 11.2 oz  Weight (kg) 76.295 kg 77.111 kg 75.615 kg     There is no height or weight on file to calculate BMI.  General:  Elderly, well-appearing female HEENT: normal Neck: no JVD Cardiac:  Irregular rhythm, tachycardic rate Lungs:  Respirations unlabored, exam difficult Abd: soft, + bowel sounds Ext: no edema, B pulses Musculoskeletal:  No deformities, BUE and BLE strength normal and equal Skin: warm and dry  Neuro:  Dementia, left eye blindness, aphasia Psych:  pleasant  EKG:  The EKG was personally reviewed and demonstrates:  Atrial flutter Telemetry:  Telemetry was personally reviewed and demonstrates:  Atrial fllutter  Relevant CV Studies:  Echo 03/19/17: Study Conclusions - Left ventricle: The cavity size was normal. Systolic function was   normal. The estimated ejection fraction was in the range of 55%   to 60%. Left ventricular diastolic function parameters were   normal. - Aortic valve: Mildly thickened, moderately calcified leaflets.   There was mild regurgitation. - Mitral valve: There was mild regurgitation. - Left atrium: The atrium was normal in size. - Right ventricle: The cavity size was normal. Wall thickness was   normal. Systolic function was normal. -  Right atrium: The atrium was normal in size. - Tricuspid valve: There was mild regurgitation. - Pulmonic valve: There was no regurgitation. - Pericardium, extracardiac: The pericardium was normal in   appearance.  Impressions: - No cardiac source of emboli was indentified.   Myoview 08/13/17:  Nuclear stress EF: 67%.  There was no ST segment deviation noted during stress.  The study is normal.  This is a low risk study.  The left ventricular ejection fraction is hyperdynamic (>65%).   Normal resting and stress perfusion. No ischemia or infarction EF 67%   Laboratory Data:  Chemistry Recent Labs  Lab 04/21/18 1201  NA 138  K 4.1  CL 104  CO2 26  GLUCOSE 139*  BUN 21  CREATININE 0.95  CALCIUM 8.9  GFRNONAA 52*  GFRAA >60  ANIONGAP 8    No results for input(s): PROT, ALBUMIN, AST, ALT, ALKPHOS, BILITOT in the last 168 hours. Hematology Recent Labs  Lab 04/21/18 1201  WBC 7.0  RBC 4.73  HGB 14.5  HCT 45.3  MCV 95.8  MCH 30.7  MCHC 32.0  RDW 13.1  PLT 210   Cardiac EnzymesNo results for input(s): TROPONINI in the last 168 hours.  Recent Labs  Lab 04/21/18 1225  TROPIPOC 0.06    BNPNo results for input(s): BNP, PROBNP in the last 168 hours.  DDimer No  results for input(s): DDIMER in the last 168 hours.  Radiology/Studies:  Dg Chest Portable 1 View  Result Date: 04/21/2018 CLINICAL DATA:  Tachycardia EXAM: PORTABLE CHEST 1 VIEW COMPARISON:  December 13, 2017 FINDINGS: The heart size and mediastinal contours are stable. The heart size is enlarged. The aorta is tortuous. Both lungs are clear. The visualized skeletal structures are stable. IMPRESSION: No active cardiopulmonary disease.  Cardiomegaly. Electronically Signed   By: Abelardo Diesel M.D.   On: 04/21/2018 13:11    Assessment and Plan:   1. Chest pain - POC troponin negative - EKG with Atrial flutter vs coarse Afib in the 140s - per nursing, patient has not complained of chest pain since  she arrived in ER - CXR clear   2. Atrial flutter vs coarse Afib - EKG with atrial flutter, telemetry with rates in the 120-140s - This patients CHA2DS2-VASc Score and unadjusted Ischemic Stroke Rate (% per year) is equal to 9.7 % stroke rate/year from a score of 6 (HTN, MI, age2, stroke2) - however not an anticoagulation candidate, has had stomach pain and bleeding previously on eliquis; also fall risk given her partial blindness  - will need to monitor BP as it has been labile in the past - will discuss with attending AV nodal agents vs amiodarone for rate/rhythm control   3. HTN - pressures well-controlled 111/63 - current home medications include bystolic, norvasc, and clonidine patch    For questions or updates, please contact [place] Please consult [email or web address] for contact info under     Signed, Ledora Bottcher, PA  04/21/2018 1:48 PM As above, patient seen and examined.  Briefly she is a 85 year old female with past medical history of coronary artery disease, prior large CVA resulting in blindness and some dysarthria, dementia, hypertension, supraventricular tachycardia with atrial flutter and chest pain.  Patient has significant dementia.  She lives at home with 24-hour care.  She must be led around because of blindness and has fallen previously.  She also apparently had GI bleed with Eliquis in the past.  By report she had chest pain last evening and was given sublingual nitroglycerin with resolution of symptoms.  Her heart rate was also elevated and her systolic blood pressure decreased.  Therefore her daughter brought her to the emergency room.  Patient presently denies chest pain or dyspnea.  No palpitations.  On exam patient does respond but is at times incoherent.  She is blind in her left eye.  She is tachycardic and irregular. Electrocardiogram shows probable atrial flutter versus coarse atrial fibrillation.  Lateral infarct.  1 atrial flutter versus coarse  atrial fibrillation with rapid ventricular response-patient's heart rate is elevated.  Systolic blood pressure is 110.  We will discontinue clonidine, amlodipine and Bystolic.  Begin Cardizem 30 mg by mouth every 6 hours.  Follow heart rate and advance regimen as needed. CHADSvasc 7.  However she has multiple high risk features for anticoagulation including blindness, prior falls and history of GI bleed on apixaban.  Long discussion with patient's daughter.  I discussed the risks and benefits of anticoagulation.  I explained that the risk of not anticoagulating would be CVA.  She understands.  We have elected not to anticoagulate as we feel risk outweighs benefit.  Check TSH.  Recent echocardiogram in July showed normal LV function.  Our goal will be rate control if possible.  She is not a good candidate for any invasive procedures.  2 no CODE BLUE status  3 hypertension-patient's  blood pressure is borderline.  I have discontinued her antihypertensives to allow room for addition of Cardizem.  Follow blood pressure and adjust regimen as needed.  4 history of CVA/dementia  5 history of coronary artery disease-continue Plavix.  Kirk Ruths, MD

## 2018-04-21 NOTE — Progress Notes (Signed)
   Vital Signs MEWS/VS Documentation      04/21/2018 1400 04/21/2018 1600 04/21/2018 1708 04/21/2018 1725   MEWS Score:  4  4  4  3    MEWS Score Color:  Red  Red  Red  Yellow   Resp:  (!) 24  13  -  18   Pulse:  (!) 140  (!) 146  -  (!) 152   BP:  117/87  (!) 106/94  -  (!) 143/78   Temp:  -  -  -  97.9 F (36.6 C)   O2 Device:  -  -  -  Room Air           Ren Aspinall T Jrue Yambao 04/21/2018,5:55 PM

## 2018-04-21 NOTE — Discharge Summary (Deleted)
HISTORY AND PHYSICAL       PATIENT DETAILS Name: [name] [name] Age: 85 y.o. Sex: female Date of Birth: 02-13-1925 Admit Date: 04/21/2018 [id number], Butch Penny, MD (Inactive)   Patient coming from: Home with 24/7 care   CHIEF COMPLAINT:  Tachycardia.  HPI: [name] [name] is a 85 y.o. 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  Apparently, yesterday evening-patient indicated that she was having chest pain.  She took 2 tablets of nitroglycerin with relief.  Vital signs taken at home showed heart rate in the 130s, and the blood pressure reportedly was in the 33A systolic.  Patient went to bed, this morning she was hard to arouse and she felt lethargic.  Found to be persistently tachycardic by her caregivers at home-and subsequently brought to the emergency room.  In serum, patient was found to be in A. fib with RVR.  Cardiology was consulted and the hospitalist service was asked to admit this patient for further evaluation.  Note-due to advanced dementia-expressive aphasia-patient is not able to participate in the history taking process.  Most of this history is obtained after discussion with the patient's daughter at bedside.  There is no history of fever, nausea, vomiting, shortness of breath.  ED Course:  Cardiology consulted-started on oral Cardizem.  Note: Lives at: Home with 24/7 care. Mobility: Somewhat ambulatory-needs to hold onto things to ambulate. Chronic Indwelling Foley:no   REVIEW OF SYSTEMS: (Obtained from daughter) Constitutional:   No  weight loss, night sweats,  Fevers  HEENT:    No headaches, Dysphagia  Cardio-vascular: No Orthopnea, PND,lower extremity edema,  GI:  No  heartburn, indigestion, abdominal pain, nausea  Resp: No shortness of breath, cough  Skin:  No rash or lesions.  GU:  No dysuria, change in color of urine  Endocrine: No heat intolerance  Psych: No change in mood or affect.    ALLERGIES:   Allergies  Allergen Reactions  . Fluorescein Anaphylaxis, Shortness Of Breath, Itching and Swelling  . Hydralazine Hcl Anaphylaxis, Swelling, Palpitations and Rash  . Sulfa Antibiotics Rash and Other (See Comments)    Other Reaction: red bumps  . Ultram [Tramadol] Hives  . Yellow Dye Anaphylaxis, Itching, Swelling and Other (See Comments)    Fluorescein (in eye drops)  . Buprenex [Buprenorphine Hcl] Palpitations and Other (See Comments)  . Keflex [Cephalexin] Diarrhea  . Lipitor [Atorvastatin] Other (See Comments)    Muscle weakness  . Restasis [Cyclosporine] Swelling    redness of face with slight swelling   . Augmentin [Amoxicillin-Pot Clavulanate] Nausea And Vomiting  . Levofloxacin Other (See Comments)    Reaction not recalled  . Morphine And Related Other (See Comments)    agitation   . Percocet [Oxycodone-Acetaminophen] Nausea And Vomiting  . Tape Other (See Comments)    Adhesive tape pulls off the skin Able to tolerate paper tape  . Zetia [Ezetimibe] Other (See Comments)    Myalgias    PAST MEDICAL HISTORY: Past Medical History:  Diagnosis Date  . Arthritis    "thumbs, joints" (03/23/2017  . Basal cell carcinoma    "several; scattered over my face, hands, leg some cut off; some burned off"  . Blind left eye    a. Corneal transplant x3  with rejection  . CAD (coronary artery disease)    a. s/p NSTEMI 2015 treated conservatively; nuclear stress test low risk. b. Continued angina despite med rx 07/2015 - s/p Bio Freedom Stent to ramus intermedius with mod LAD stenosis neg by FFR. // Nuc study 5/19:  EF 67, no ischemia or scar; Low Risk   . Complication of anesthesia    "very sensitive to RX"  . Diverticulitis large  intestine   . Diverticulosis   . Family history of adverse reaction to anesthesia    "we all get PONV" (1/1/201)  . Gastritis   . GERD (gastroesophageal reflux disease)   . Hayfever   . Helicobacter pylori (H. pylori) 05/22/02   RUT-Positive  . Hypertension   . Myocardial infarction ([place]) 2015  . Squamous carcinoma    "several; scattered over my face, hands, leg some cut off; some burned off"  . Stroke [facility]) 2015   denies residual on 08/07/2015  . Stroke [facility]) 03/18/2017   "has effected her vision, balance, some memory" (03/23/2017)  . SVT (supraventricular tachycardia) (HCC)    a. seen by Dr. Caryl Comes - has loop recorder in. Patient has declined amiodarone due to side effect profile  . Varicose veins     PAST SURGICAL HISTORY: Past Surgical History:  Procedure Laterality Date  . BASAL CELL CARCINOMA EXCISION     "several; scattered over my face, hands, leg some cut off; some burned off"  . CARDIAC CATHETERIZATION N/A 08/07/2015   Procedure: Left Heart Cath and Coronary Angiography;  Surgeon: [name], MD;  Location: [place] [facility];  Service: Cardiovascular;  Laterality: N/A;  . CARDIAC CATHETERIZATION N/A 08/07/2015   Procedure: Coronary Stent Intervention;  Surgeon: [name], MD;  Location: [place] [facility];  Service: Cardiovascular;  Laterality: N/A;  . CARDIAC CATHETERIZATION N/A 08/07/2015   Procedure: Intravascular Pressure Wire/FFR Study;  Surgeon: [name], MD;  Location: [place] [facility];  Service: Cardiovascular;  Laterality: N/A;  . CATARACT EXTRACTION W/ INTRAOCULAR LENS  IMPLANT, BILATERAL Bilateral 2005  . CLOSED REDUCTION SHOULDER DISLOCATION Left 2016 X 2  . CORNEAL TRANSPLANT Bilateral right 2009, left 2009    3 in left eye (last 2 failed), 1 in right eye  . DILATION AND CURETTAGE OF UTERUS    . EYE SURGERY    . JOINT REPLACEMENT    . [place]   "had gangrene in it"  . LOOP RECORDER IMPLANT N/A 01/05/2014    Procedure: LOOP RECORDER IMPLANT;  Surgeon: Coralyn Mark, MD;  Location: [place] C[facility];  Service: Cardiovascular;  Laterality: N/A;  . SQUAMOUS CELL CARCINOMA EXCISION     "several; scattered over my face, hands, leg some cut off; some burned off"  . TONSILLECTOMY    . TOTAL ABDOMINAL HYSTERECTOMY  11/1983   "ovaries and all"  . TOTAL KNEE ARTHROPLASTY Left 06/27/2012   Procedure: LEFT TOTAL KNEE ARTHROPLASTY;  Surgeon: Gearlean Alf, MD;  Location: [facility];  Service: Orthopedics;  Laterality: Left;  . TOTAL KNEE ARTHROPLASTY Right 12/12/2012   Procedure: RIGHT TOTAL KNEE ARTHROPLASTY;  Surgeon: Gearlean Alf, MD;  Location: [facility];  Service: Orthopedics;  Laterality: Right;  . TUBAL LIGATION  1966    MEDICATIONS AT HOME: Prior to Admission medications   Medication Sig Start Date End Date Taking? Authorizing Provider  clonazePAM (KLONOPIN) 0.5 MG tablet Take 0.25-5 mg by mouth See admin instructions. Take 1/2 tablet (0.25mg ) in the morning, and 1 tablet (.5mg )  at night May take additional 1/2 tablet (.25mg ) in the afternoon if needed 03/10/18  Yes [provider]  clopidogrel (PLAVIX) 75 MG tablet Take 1 tablet (75 mg total) by mouth daily. Patient taking differently: Take 75 mg by mouth every morning.  12/30/17  Yes [name], MD  donepezil (ARICEPT) 5 MG tablet Take 7.5 mg by mouth every morning.  01/13/18  Yes [provider]  nitroGLYCERIN (NITROSTAT) 0.4 MG SL tablet Place 1 tablet (0.4 mg total) under the tongue every 5 (five) minutes as needed for chest pain. 08/08/15  Yes Rosita Fire, Brittainy M, PA-C  prednisoLONE acetate (PRED FORTE) 1 % ophthalmic suspension Place 1 drop into both eyes See admin instructions. 1 drop into both eyes at bedtime spaced 5 minutes apart from Systane gel   Yes [provider]  risperiDONE (RISPERDAL) 2 MG tablet Take 2 mg by mouth at bedtime.  01/13/18  Yes [provider]  sertraline (ZOLOFT) 100 MG tablet Take 150 mg  by mouth every morning.  10/28/17  Yes [provider]  traZODone (DESYREL) 50 MG tablet Take 25 mg by mouth at bedtime. 03/10/18  Yes [provider]    FAMILY HISTORY: Family History  Problem Relation Age of Onset  . Stroke Mother   . Heart attack Father   . Heart attack Brother   . Cancer Brother   . Heart attack Brother   . Heart attack Brother   . Heart attack Brother   . Parkinsonism Brother      SOCIAL HISTORY:  reports that she has never smoked. She has never used smokeless tobacco. She reports that she does not drink alcohol or use drugs.  PHYSICAL EXAM: Blood pressure (!) 106/94, pulse (!) 146, temperature (!) 97.5 F (36.4 C), temperature source Oral, resp. rate 13, SpO2 93 %.  General appearance :Awake, alert, not in any distress.  Very frail appearing-chronically sick looking. HEENT: Atraumatic and Normocephalic Neck: supple, no JVD.  Resp:Good air entry bilaterally, no added sounds  CVS: S1 S2 irregular-tachycardic GI: Bowel sounds present, Non tender and not distended with no gaurding, rigidity or rebound. Extremities: B/L Lower Ext shows no edema, both legs are warm to touch Neurology: Difficult exam-but seems to be moving all 4 extremities. Musculoskeletal:gait appears to be normal.No digital cyanosis Skin:No Rash, warm and dry Wounds:N/A  LABS ON ADMISSION:  I have personally reviewed following labs and imaging studies  CBC: Recent Labs  Lab 04/21/18 1201  WBC 7.0  NEUTROABS 4.9  HGB 14.5  HCT 45.3  MCV 95.8  PLT 671    Basic Metabolic Panel: Recent Labs  Lab 04/21/18 1201  NA 138  K 4.1  CL 104  CO2 26  GLUCOSE 139*  BUN 21  CREATININE 0.95  CALCIUM 8.9  MG 2.2    GFR: CrCl cannot be calculated (Unknown ideal weight.).  Liver Function Tests: No results for input(s): AST, ALT, ALKPHOS, BILITOT, PROT, ALBUMIN in the last 168 hours. No results for input(s): LIPASE, AMYLASE in the last 168 hours. No results for  input(s): AMMONIA in the last 168 hours.  Coagulation Profile: No results for input(s): INR, PROTIME in the last 168 hours.  Cardiac Enzymes: No results for input(s): CKTOTAL, CKMB, CKMBINDEX, TROPONINI in the last 168 hours.  BNP (last 3 results) No results for input(s): PROBNP in the last 8760 hours.  HbA1C: No results for input(s): HGBA1C in the last 72 hours.  CBG: No results for input(s): GLUCAP in the last 168  hours.  Lipid Profile: No results for input(s): CHOL, HDL, LDLCALC, TRIG, CHOLHDL, LDLDIRECT in the last 72 hours.  Thyroid Function Tests: No results for input(s): TSH, T4TOTAL, FREET4, T3FREE, THYROIDAB in the last 72 hours.  Anemia Panel: No results for input(s): VITAMINB12, FOLATE, FERRITIN, TIBC, IRON, RETICCTPCT in the last 72 hours.  Urine analysis:    Component Value Date/Time   COLORURINE YELLOW 04/21/2018 [place] 04/21/2018 1201   LABSPEC 1.009 04/21/2018 1201   PHURINE 6.0 04/21/2018 1201   GLUCOSEU NEGATIVE 04/21/2018 1201   HGBUR NEGATIVE 04/21/2018 [place] 04/21/2018 [place] 04/21/2018 [place] 04/21/2018 1201   UROBILINOGEN 0.2 12/19/2014 1830   NITRITE NEGATIVE 04/21/2018 1201   LEUKOCYTESUR MODERATE (A) 04/21/2018 1201    Sepsis Labs: Lactic Acid, Venous    Component Value Date/Time   LATICACIDVEN 1.05 12/23/2012 1836     Microbiology: No results found for this or any previous visit (from the past 240 hour(s)).    RADIOLOGIC STUDIES ON ADMISSION: Ct Head Wo Contrast  Result Date: 04/21/2018 CLINICAL DATA:  Altered level of consciousness, history stroke, coronary artery disease post and high, hypertension, blindness LEFT eye EXAM: CT HEAD WITHOUT CONTRAST TECHNIQUE: Contiguous axial images were obtained from the base of the skull through the vertex without intravenous contrast. Sagittal and coronal MPR images reconstructed from axial data set. COMPARISON:   01/15/2018 FINDINGS: Brain: Generalized atrophy. Normal ventricular morphology. No midline shift or mass effect. Small vessel chronic ischemic changes of deep cerebral white matter. Large old LEFT temporoparietal infarct. Old RIGHT occipital infarct. Old RIGHT thalamic infarct. No intracranial hemorrhage, mass lesion, or evidence of acute infarction. No extra-axial fluid collections. Vascular: Minimal atherosclerotic calcifications of internal carotid arteries at skull base. No hyperdense vessels. Skull: Intact Sinuses/Orbits: Clear Other: N/A IMPRESSION: Atrophy with small vessel chronic ischemic changes of deep cerebral white matter. Old LEFT temporoparietal, RIGHT occipital and RIGHT thalamic infarcts. No acute intracranial abnormalities. Electronically Signed   By: Lavonia Dana M.D.   On: 04/21/2018 14:37   Dg Chest Portable 1 View  Result Date: 04/21/2018 CLINICAL DATA:  Tachycardia EXAM: PORTABLE CHEST 1 VIEW COMPARISON:  December 13, 2017 FINDINGS: The heart size and mediastinal contours are stable. The heart size is enlarged. The aorta is tortuous. Both lungs are clear. The visualized skeletal structures are stable. IMPRESSION: No active cardiopulmonary disease.  Cardiomegaly. Electronically Signed   By: Abelardo Diesel M.D.   On: 04/21/2018 13:11   I have personally reviewed images of chest xray-no consolidation  EKG:  Personally reviewed.  A. fib with RVR  ASSESSMENT AND PLAN: Paroxysmal A. fib with RVR: We will attempt rate control with Cardizem as recommended by cardiology.  Patient is very frail-has had prior falls-has had GI bleeding when she was recently started on Eliquis-hence not a good long-term candidate for further anticoagulation.  Long discussion with the patient's daughter at bedside-continue Plavix.  Monitor in cardiac telemetry-and gently optimize rate control with Cardizem-we will utilize IV Lopressor as needed.  History of CVA with residual expressive aphasia and right visual  field cut: Continue Plavix.  History of CAD: Left chest pain yesterday-but troponin is negative.  Continue supportive care-follow-up.  Hypertension: We will hold other antihypertensives as recommended by cardiology-to allow room for titration of Cardizem.  Dementia with behavioral disturbances: At risk for further delirium during this hospital stay-continue Aricept, Risperdal, Zoloft and trazodone.  Resume Klonopin per home regimen.  Deconditioning/debility/frailty  in the setting of advanced dementia: Lives at home with 24/7 care.  Just about able to walk with some assistance.  Long discussion with daughter-okay for gentle medical treatment.  DNR in place.  Further plan will depend as patient's clinical course evolves and further radiologic and laboratory data become available. Patient will be monitored closely.  Above noted plan was discussed with daughter face to face at bedside, she was in agreement.   CONSULTS: None  DVT Prophylaxis: Prophylactic Lovenox   Code Status: DNR (golden yellow DNR form/paperwork at bedside)  Disposition Plan:  Discharge back home possibly in 1-2 days, depending on clinical course  Admission status: Observation going to tele  Total time spent  55 minutes.Greater than 50% of this time was spent in counseling, explanation of diagnosis, planning of further management, and coordination of care.  Oren Binet [facility] Pager [phone]  If 7PM-7AM, please contact night-coverage [email or web address] 04/21/2018, 4:19 PM

## 2018-04-22 DIAGNOSIS — I4891 Unspecified atrial fibrillation: Secondary | ICD-10-CM | POA: Diagnosis not present

## 2018-04-22 DIAGNOSIS — I10 Essential (primary) hypertension: Secondary | ICD-10-CM | POA: Diagnosis not present

## 2018-04-22 DIAGNOSIS — I4892 Unspecified atrial flutter: Secondary | ICD-10-CM | POA: Diagnosis not present

## 2018-04-22 LAB — BASIC METABOLIC PANEL
Anion gap: 8 (ref 5–15)
BUN: 19 mg/dL (ref 8–23)
CO2: 27 mmol/L (ref 22–32)
Calcium: 8.7 mg/dL — ABNORMAL LOW (ref 8.9–10.3)
Chloride: 103 mmol/L (ref 98–111)
Creatinine, Ser: 0.76 mg/dL (ref 0.44–1.00)
GFR calc Af Amer: >60 mL/min (ref >60)
GFR calc non Af Amer: >60 mL/min (ref >60)
Glucose, Bld: 109 mg/dL — ABNORMAL HIGH (ref 70–99)
POTASSIUM: 3.9 mmol/L (ref 3.5–5.1)
Sodium: 138 mmol/L (ref 135–145)

## 2018-04-22 LAB — CBC
HCT: 40.6 % (ref 36.0–46.0)
HEMOGLOBIN: 12.6 g/dL (ref 12.0–15.0)
MCH: 29.4 pg (ref 26.0–34.0)
MCHC: 31 g/dL (ref 30.0–36.0)
MCV: 94.6 fL (ref 80.0–100.0)
Platelets: 184 10*3/uL (ref 150–400)
RBC: 4.29 MIL/uL (ref 3.87–5.11)
RDW: 13.4 % (ref 11.5–15.5)
WBC: 6.2 10*3/uL (ref 4.0–10.5)
nRBC: 0 % (ref 0.0–0.2)

## 2018-04-22 LAB — URINE CULTURE: Culture: 20000 — AB

## 2018-04-22 MED ORDER — SODIUM CHLORIDE 0.9 % IV SOLN
1.0000 g | INTRAVENOUS | Status: DC
  Administered 2018-04-22: 1 g via INTRAVENOUS
  Filled 2018-04-22: qty 10

## 2018-04-22 MED ORDER — LOPERAMIDE HCL 2 MG PO CAPS
2.0000 mg | ORAL_CAPSULE | ORAL | Status: DC | PRN
  Administered 2018-04-22: 2 mg via ORAL
  Filled 2018-04-22: qty 1

## 2018-04-22 MED ORDER — DILTIAZEM HCL ER COATED BEADS 180 MG PO CP24: 180.0000 mg | ORAL_CAPSULE | Freq: Every day | ORAL | 0 refills | Status: DC

## 2018-04-22 MED ORDER — CEFIXIME 400 MG PO CAPS: 400.0000 mg | ORAL_CAPSULE | Freq: Every day | ORAL | 0 refills | Status: DC

## 2018-04-22 MED ORDER — DILTIAZEM HCL ER COATED BEADS 180 MG PO CP24
180.0000 mg | ORAL_CAPSULE | Freq: Every day | ORAL | Status: DC
  Administered 2018-04-22: 180 mg via ORAL
  Filled 2018-04-22: qty 1

## 2018-04-22 MED ORDER — DILTIAZEM HCL ER COATED BEADS 120 MG PO CP24: 120.0000 mg | ORAL_CAPSULE | Freq: Every day | ORAL | Status: DC

## 2018-04-22 NOTE — Consult Note (Signed)
   [facility] CM Inpatient Consult   04/22/2018  [name] [name] 02/07/1925 [id number]   Patient screened for potential [place] Management services are needed . Chart review reveals patient has 24 hour caregivers and her for Atrial Fib with RVR.  Patient with Advanced dementia and has 24 hour care givers.  HX with [facility] follow up in the past.  Patient already transitioned home.  Will follow with General EMMI.  PCP: Darcus Austin, this office provides the transition of care calls.   For questions contact:   Natividad Brood, RN BSN [facility] Liaison  [phone] business mobile phone Toll free office [phone]

## 2018-04-22 NOTE — Progress Notes (Signed)
   04/22/18 0040  ECG Monitoring  QRS interval 0.08  ECG Heart Rate 67  Cardiac Rhythm NSR (converted to SR; Criselda Starke, RN notified)  PR interval 0.2  QT interval 0.41  QTc interval 0.46  Pt converted back to NSR.

## 2018-04-22 NOTE — Care Management Note (Signed)
Case Management Note  Patient Details  Name: [name] [name] MRN: [id number] Date of Birth: 12-01-1924  Subjective/Objective:      Atrial Fib             Action/Plan: Patient lives at home with family has 24 hr caregivers at home; CM talked to sitter at the bedside and voicemail left with patient's daughter [name] to introduce myself and to see if patient had other needs.   Expected Discharge Date:  04/22/18               Expected Discharge Plan:   Home with 24 hr caregivers  Status of Service:   Completed  Sherrilyn Rist [phone] 04/22/2018, 11:43 AM

## 2018-04-22 NOTE — Progress Notes (Addendum)
Progress Note  Patient Name: [name] [name] Date of Encounter: 04/22/2018  Primary Cardiologist: [name], MD   Subjective   Patient is sleepy this morning.  Her daughter states that she has not woken up yet.  She is currently not answering my questions with appropriate answers.  She appears quite comfortable.  Inpatient Medications    Scheduled Meds: . clonazepam  0.25 mg Oral Daily   And  . clonazepam  0.5 mg Oral QHS  . clopidogrel  75 mg Oral Daily  . diltiazem  30 mg Oral Q6H  . donepezil  7.5 mg Oral Daily  . enoxaparin (LOVENOX) injection  40 mg Subcutaneous Q24H  . prednisoLONE acetate  1 drop Both Eyes QHS  . risperiDONE  2 mg Oral QHS  . sertraline  150 mg Oral Daily  . sodium chloride flush  3 mL Intravenous Q12H  . traZODone  25 mg Oral QHS   Continuous Infusions: . sodium chloride     PRN Meds: sodium chloride, albuterol, clonazepam, metoprolol tartrate, nitroGLYCERIN, ondansetron **OR** ondansetron (ZOFRAN) IV, polyethylene glycol, sodium chloride flush   Vital Signs    Vitals:   04/22/18 0044 04/22/18 0616 04/22/18 0617 04/22/18 0842  BP: (!) 115/53 (!) 124/45  (!) 145/68  Pulse: 71 68  75  Resp: 16 18  20   Temp: 97.9 F (36.6 C) 98 F (36.7 C)  97.7 F (36.5 C)  TempSrc:  Oral  Oral  SpO2: 94% 90%  95%  Weight:   72.1 kg   Height:        Intake/Output Summary (Last 24 hours) at 04/22/2018 0855 Last data filed at 04/22/2018 0113 Gross per 24 hour  Intake 1479.99 ml  Output -  Net 1479.99 ml   Last 3 Weights 04/22/2018 04/21/2018 12/01/2017  Weight (lbs) 158 lb 15.2 oz 160 lb 7.9 oz 168 lb 3.2 oz  Weight (kg) 72.1 kg 72.8 kg 76.295 kg      Telemetry    Converted to sinus rhythm at about midnight with rates of 60s-80s- Personally Reviewed  ECG    No new tracings- Personally Reviewed  Physical Exam   GEN: No acute distress.   Neck: No JVD Cardiac: RRR, no murmurs, rubs, or gallops.  Respiratory: Clear to auscultation  bilaterally. GI: Soft, nontender, non-distended  MS: No edema; No deformity. Neuro:   Patient with advanced dementia, minimally conversant this morning Psych: Blunted affect   Labs    Chemistry Recent Labs  Lab 04/21/18 1201 04/22/18 0551  NA 138 138  K 4.1 3.9  CL 104 103  CO2 26 27  GLUCOSE 139* 109*  BUN 21 19  CREATININE 0.95 0.76  CALCIUM 8.9 8.7*  GFRNONAA 52* >60  GFRAA >60 >60  ANIONGAP 8 8     Hematology Recent Labs  Lab 04/21/18 1201 04/22/18 0551  WBC 7.0 6.2  RBC 4.73 4.29  HGB 14.5 12.6  HCT 45.3 40.6  MCV 95.8 94.6  MCH 30.7 29.4  MCHC 32.0 31.0  RDW 13.1 13.4  PLT 210 184    Cardiac EnzymesNo results for input(s): TROPONINI in the last 168 hours.  Recent Labs  Lab 04/21/18 1225  TROPIPOC 0.06     BNPNo results for input(s): BNP, PROBNP in the last 168 hours.   DDimer No results for input(s): DDIMER in the last 168 hours.   Radiology    Ct Head Wo Contrast  Result Date: 04/21/2018 CLINICAL DATA:  Altered level of consciousness, history stroke, coronary  artery disease post and high, hypertension, blindness LEFT eye EXAM: CT HEAD WITHOUT CONTRAST TECHNIQUE: Contiguous axial images were obtained from the base of the skull through the vertex without intravenous contrast. Sagittal and coronal MPR images reconstructed from axial data set. COMPARISON:  01/15/2018 FINDINGS: Brain: Generalized atrophy. Normal ventricular morphology. No midline shift or mass effect. Small vessel chronic ischemic changes of deep cerebral white matter. Large old LEFT temporoparietal infarct. Old RIGHT occipital infarct. Old RIGHT thalamic infarct. No intracranial hemorrhage, mass lesion, or evidence of acute infarction. No extra-axial fluid collections. Vascular: Minimal atherosclerotic calcifications of internal carotid arteries at skull base. No hyperdense vessels. Skull: Intact Sinuses/Orbits: Clear Other: N/A IMPRESSION: Atrophy with small vessel chronic ischemic changes  of deep cerebral white matter. Old LEFT temporoparietal, RIGHT occipital and RIGHT thalamic infarcts. No acute intracranial abnormalities. Electronically Signed   By: Lavonia Dana M.D.   On: 04/21/2018 14:37   Dg Chest Portable 1 View  Result Date: 04/21/2018 CLINICAL DATA:  Tachycardia EXAM: PORTABLE CHEST 1 VIEW COMPARISON:  December 13, 2017 FINDINGS: The heart size and mediastinal contours are stable. The heart size is enlarged. The aorta is tortuous. Both lungs are clear. The visualized skeletal structures are stable. IMPRESSION: No active cardiopulmonary disease.  Cardiomegaly. Electronically Signed   By: Abelardo Diesel M.D.   On: 04/21/2018 13:11    Cardiac Studies   Echo 03/19/17: Study Conclusions - Left ventricle: The cavity size was normal. Systolic function was normal. The estimated ejection fraction was in the range of 55% to 60%. Left ventricular diastolic function parameters were normal. - Aortic valve: Mildly thickened, moderately calcified leaflets. There was mild regurgitation. - Mitral valve: There was mild regurgitation. - Left atrium: The atrium was normal in size. - Right ventricle: The cavity size was normal. Wall thickness was normal. Systolic function was normal. - Right atrium: The atrium was normal in size. - Tricuspid valve: There was mild regurgitation. - Pulmonic valve: There was no regurgitation. - Pericardium, extracardiac: The pericardium was normal in appearance.  Impressions: - No cardiac source of emboli was indentified.   Myoview 08/13/17:  Nuclear stress EF: 67%.  There was no ST segment deviation noted during stress.  The study is normal.  This is a low risk study.  The left ventricular ejection fraction is hyperdynamic (>65%).  Normal resting and stress perfusion. No ischemia or infarction EF 67%  Patient Profile     85 y.o. female with a hx of CAD and NSTEMI 2015 (medical management), LHC 07/2015 with stent to RI,  negative FFR of moderately diseased LAD with recommendations to continue plavix indefinitely, low risk NST 07/2017 with continued angina, labile HTN, SVT, dementia and multiple CVAs with left eye blindness who is being seen for the evaluation of chest pain and Aflutter  Assessment & Plan    1. Chest pain -POC troponin negative. -With an episode of chest pain at home relieved with nitroglycerin.  No chest pain since that time. -Patient's daughter has not noted any signs of or complaints of chest pain.  2.  Atrial flutter versus coarse A. Fib -Admitted with a flutter versus coarse A. fib with RVR and soft blood pressure.  Clonidine amlodipine and Bystolic discontinued.  Cardizem 30 mg p.o. every 6 hours initiated. -This patients CHA2DS2-VASc Score and unadjusted Ischemic Stroke Rate (% per year) is equal to 9.7 % stroke rate/year from a score of 6 (HTN, MI, age2, stroke2), however patient has multiple high risk features for anticoagulation including blindness,  prior falls and history of GI bleed on apixaban.  Dr. Stanford Breed discussed risk/benefit of anticoagulation with family and they have elected not to anticoagulate.  -Currently goal is rate control -Rhythm converted to sinus rhythm at about midnight last night with rate 60s-80s. -Consolidate diltiazem to 120 mg CD daily  3.  Hypertension -Blood pressure medications discontinued to make room for diltiazem.  Blood pressure has been mostly well controlled, mildly elevated this morning at 145/68. -We will continue with current therapy  4.  No CODE BLUE status  5.  History of CVA/dementia  6.  History of coronary artery disease -Continues on Plavix     For questions or updates, please contact [place] Please consult [email or web address] for contact info under        Signed, Daune Perch, NP  04/22/2018, 8:55 AM   As above, patient seen and examined.  She is confused this morning but denies dyspnea or chest pain.  She has converted to  sinus rhythm.  We will change Cardizem to 180 mg daily.  Her remaining blood pressure medications have been discontinued including clonidine, Bystolic and amlodipine.  Follow blood pressure at home and adjust regimen as needed.  We have elected not to anticoagulate given overall risk including patient being blind, risk of fall and prior GI bleed.  Daughter in agreement.  Patient can be discharged from a cardiac standpoint.  [facility] will sign off.   Medication Recommendations: Cardiac medications as outlined in MAR. Other recommendations (labs, testing, etc): No additional testing needed. Follow up as an outpatient: Dr. Burt Knack 8 weeks.  Kirk Ruths, MD

## 2018-04-22 NOTE — Evaluation (Signed)
Physical Therapy Evaluation Patient Details Name: [name] [name] MRN: [id number] DOB: [birth year] Today's Date: 04/22/2018   History of Present Illness  Patient is a 85 y/o female who presents with RUE/LE tingling and chest pain. Found to be in [place]. PMH includes dementia, CAd s/p NSTEMI 2015 (medical management), HTN, DVT, CVA with left eye blindness,   Clinical Impression  Patient tolerated bed mobility, transfers and gait training with Min A for balance/safety. Pt has 24/7 caregivers at home and requires assist for all aspects of care (ADLs and ambulation). Caregiver assisted with mobility due to pt's preference, visual deficits, cognition and familiarity. Mobility safe and at baseline per caregiver. HR stable throughout. Pt does not require skilled therapy services as pt functioning at baseline and has 24/7 assist at home. Discharge from therapy.     Follow Up Recommendations No PT follow up;Supervision for mobility/OOB;Supervision/Assistance - 24 hour    Equipment Recommendations  None recommended by PT    Recommendations for Other Services       Precautions / Restrictions Precautions Precautions: Fall Restrictions Weight Bearing Restrictions: No      Mobility  Bed Mobility Overal bed mobility: Needs Assistance Bed Mobility: Supine to Sit;Sit to Supine     Supine to sit: Min assist;HOB elevated Sit to supine: Min assist;HOB elevated   General bed mobility comments: Caregiver assisted pt with bed mobility as she does at home, Min A for trunk.  Transfers Overall transfer level: Needs assistance Equipment used: None Transfers: Sit to/from Stand Sit to Stand: Min assist         General transfer comment: Assist to power to standing with caregiver assisting with transfer per request (pt does better with familiar people).  Ambulation/Gait Ambulation/Gait assistance: Min assist Gait Distance (Feet): 150 Feet Assistive device: 1 person hand held assist Gait  Pattern/deviations: Step-through pattern;Decreased stride length;Shuffle     General Gait Details: Mildly unsteady gait with caregiver providing HHA x2 in front of pt due to visual deficits. Safe with assist.   Stairs            Wheelchair Mobility    Modified Rankin (Stroke Patients Only)       Balance Overall balance assessment: Needs assistance Sitting-balance support: Feet supported;No upper extremity supported Sitting balance-Leahy Scale: Fair     Standing balance support: During functional activity Standing balance-Leahy Scale: Poor Standing balance comment: Requires UE support in standing and with dynamic tasks.                             Pertinent Vitals/Pain Pain Assessment: Faces Faces Pain Scale: No hurt    Home Living Family/patient expects to be discharged to:: Private residence Living Arrangements: Children Available Help at Discharge: Family;Available 24 hours/day;Personal care attendant Type of Home: House Home Access: Stairs to enter   [employer] of Steps: 3 in front, ramp in back Home Layout: Multi-level(split level) Home Equipment: Shower seat      Prior Function Level of Independence: Needs assistance   Gait / Transfers Assistance Needed: has assist for walking from daughter or caregiver always  ADL's / Homemaking Assistance Needed: assist for ADLs from daughter/caregiver.   Comments: has 24/7 caregivers     Hand Dominance   Dominant Hand: Right    Extremity/Trunk Assessment   Upper Extremity Assessment Upper Extremity Assessment: Defer to OT evaluation    Lower Extremity Assessment Lower Extremity Assessment: Generalized weakness(but functional)    Cervical / Trunk Assessment  Cervical / Trunk Assessment: Kyphotic  Communication   Communication: HOH(visual deficits)  Cognition Arousal/Alertness: Awake/alert Behavior During Therapy: Flat affect Overall Cognitive Status: History of cognitive  impairments - at baseline                                        General Comments General comments (skin integrity, edema, etc.): Caregiver present during session and participated in evaluation.    Exercises     Assessment/Plan    PT Assessment Patent does not need any further PT services  PT Problem List         PT Treatment Interventions      PT Goals (Current goals can be found in the Care Plan section)  Acute Rehab PT Goals Patient Stated Goal: to go home per pt and caregiver PT Goal Formulation: All assessment and education complete, DC therapy    Frequency     Barriers to discharge        Co-evaluation               AM-PAC PT "6 Clicks" Mobility  Outcome Measure Help needed turning from your back to your side while in a flat bed without using bedrails?: A Little Help needed moving from lying on your back to sitting on the side of a flat bed without using bedrails?: A Little Help needed moving to and from a bed to a chair (including a wheelchair)?: A Little Help needed standing up from a chair using your arms (e.g., wheelchair or bedside chair)?: A Little Help needed to walk in hospital room?: A Little Help needed climbing 3-5 steps with a railing? : A Little 6 Click Score: 18    End of Session Equipment Utilized During Treatment: Gait belt Activity Tolerance: Patient tolerated treatment well Patient left: in bed;with call bell/phone within reach;with family/visitor present Nurse Communication: Mobility status PT Visit Diagnosis: Muscle weakness (generalized) (M62.81);Difficulty in walking, not elsewhere classified (R26.2)    Time: 1005-1020 PT Time Calculation (min) (ACUTE ONLY): 15 min   Charges:   PT Evaluation $PT Eval Low Complexity: 1 Low          Wray Kearns, PT, DPT Acute Rehabilitation Services Pager [phone] Office [place] 04/22/2018, 11:30 AM

## 2018-04-22 NOTE — Discharge Instructions (Signed)
Follow with Primary MD Darcus Austin, MD (Inactive) in 7 days    Activity: As tolerated with Full fall precautions use walker/cane & assistance as needed   Disposition Home    Diet: Heart Healthy  , with feeding assistance and aspiration precautions.  For Heart failure patients - Check your Weight same time everyday, if you gain over 2 pounds, or you develop in leg swelling, experience more shortness of breath or chest pain, call your Primary MD immediately. Follow Cardiac Low Salt Diet and 1.5 lit/day fluid restriction.   On your next visit with your primary care physician please Get Medicines reviewed and adjusted.   Please request your Prim.MD to go over all Hospital Tests and Procedure/Radiological results at the follow up, please get all Hospital records sent to your Prim MD by signing hospital release before you go home.   If you experience worsening of your admission symptoms, develop shortness of breath, life threatening emergency, suicidal or homicidal thoughts you must seek medical attention immediately by calling 911 or calling your MD immediately  if symptoms less severe.  You Must read complete instructions/literature along with all the possible adverse reactions/side effects for all the Medicines you take and that have been prescribed to you. Take any new Medicines after you have completely understood and accpet all the possible adverse reactions/side effects.   Do not drive, operating heavy machinery, perform activities at heights, swimming or participation in water activities or provide baby sitting services if your were admitted for syncope or siezures until you have seen by Primary MD or a Neurologist and advised to do so again.  Do not drive when taking Pain medications.    Do not take more than prescribed Pain, Sleep and Anxiety Medications  Special Instructions: If you have smoked or chewed Tobacco  in the last 2 yrs please stop smoking, stop any regular Alcohol   and or any Recreational drug use.  Wear Seat belts while driving.   Please note  You were cared for by a hospitalist during your hospital stay. If you have any questions about your discharge medications or the care you received while you were in the hospital after you are discharged, you can call the unit and asked to speak with the hospitalist on call if the hospitalist that took care of you is not available. Once you are discharged, your primary care physician will handle any further medical issues. Please note that NO REFILLS for any discharge medications will be authorized once you are discharged, as it is imperative that you return to your primary care physician (or establish a relationship with a primary care physician if you do not have one) for your aftercare needs so that they can reassess your need for medications and monitor your lab values.

## 2018-04-22 NOTE — H&P (Signed)
HISTORY AND PHYSICAL       PATIENT DETAILS Name: [name] [name] Age: 85 y.o. Sex: female Date of Birth: 1925/02/18 Admit Date: 04/21/2018 PXT:GGYIR, Butch Penny, MD (Inactive)   Patient coming from: Home with 24/7 care   CHIEF COMPLAINT:  Tachycardia.  HPI: [name] [name] is a 85 y.o. 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  Apparently, yesterday evening-patient indicated that she was having chest pain.  She took 2 tablets of nitroglycerin with relief.  Vital signs taken at home showed heart rate in the 130s, and the blood pressure reportedly was in the 48N systolic.  Patient went to bed, this morning she was hard to arouse and she felt lethargic.  Found to be persistently tachycardic by her caregivers at home-and subsequently brought to the emergency room.  In serum, patient was found to be in A. fib with RVR.  Cardiology was consulted and the hospitalist service was asked to admit this patient for further evaluation.  Note-due to advanced dementia-expressive aphasia-patient is not able to participate in the history taking process.  Most of this history is obtained after discussion with the patient's daughter at bedside.  There is no history of fever, nausea, vomiting, shortness of breath.  ED Course:  Cardiology consulted-started on oral Cardizem.  Note: Lives at: Home with 24/7 care. Mobility: Somewhat ambulatory-needs to hold onto things to ambulate. Chronic Indwelling Foley:no   REVIEW OF SYSTEMS: (Obtained from daughter) Constitutional:   No  weight loss, night sweats,  Fevers  HEENT:    No headaches, Dysphagia  Cardio-vascular: No Orthopnea, PND,lower extremity edema,  GI:  No  heartburn, indigestion, abdominal pain, nausea  Resp: No shortness of breath, cough  Skin:  No rash or lesions.  GU:  No dysuria, change in color of urine  Endocrine: No heat intolerance  Psych: No change in mood or affect.    ALLERGIES:   Allergies  Allergen Reactions  . Fluorescein Anaphylaxis, Shortness Of Breath, Itching and Swelling  . Hydralazine Hcl Anaphylaxis, Swelling, Palpitations and Rash  . Sulfa Antibiotics Rash and Other (See Comments)    Other Reaction: red bumps  . Ultram [Tramadol] Hives  . Yellow Dye Anaphylaxis, Itching, Swelling and Other (See Comments)    Fluorescein (in eye drops)  . Buprenex [Buprenorphine Hcl] Palpitations and Other (See Comments)  . Keflex [Cephalexin] Diarrhea  . Lipitor [Atorvastatin] Other (See Comments)    Muscle weakness  . Restasis [Cyclosporine] Swelling    redness of face with slight swelling   . Augmentin [Amoxicillin-Pot Clavulanate] Nausea And Vomiting  . Levofloxacin Other (See Comments)    Reaction not recalled  . Morphine And Related Other (See Comments)    agitation   . Percocet [Oxycodone-Acetaminophen] Nausea And Vomiting  . Tape Other (See Comments)    Adhesive tape pulls off the skin Able to tolerate paper tape  . Zetia [Ezetimibe] Other (See Comments)    Myalgias    PAST MEDICAL HISTORY: Past Medical History:  Diagnosis Date  . Arthritis    "thumbs, joints" (03/23/2017  . Atrial fibrillation ([place]) 04/21/2018  . Basal cell carcinoma    "several; scattered over my face, hands, leg some cut off; some burned off"  . Blind left eye  a. Corneal transplant x3 with rejection  . CAD (coronary artery disease)    a. s/p NSTEMI 2015 treated conservatively; nuclear stress test low risk. b. Continued angina despite med rx 07/2015 - s/p Bio Freedom Stent to ramus intermedius with mod LAD stenosis neg by FFR. // Nuc study 5/19:  EF 67, no ischemia or scar; Low Risk   . Complication of anesthesia    "very  sensitive to RX"  . Diverticulitis large intestine   . Diverticulosis   . Family history of adverse reaction to anesthesia    "we all get PONV" (1/1/201)  . Gastritis   . GERD (gastroesophageal reflux disease)   . Hayfever   . Helicobacter pylori (H. pylori) 05/22/02   RUT-Positive  . Hypertension   . Myocardial infarction ([place]) 2015  . Squamous carcinoma    "several; scattered over my face, hands, leg some cut off; some burned off"  . Stroke [facility]) 2015   denies residual on 08/07/2015  . Stroke [facility]) 03/18/2017   "has effected her vision, balance, some memory" (03/23/2017)  . SVT (supraventricular tachycardia) ([facility])    a. seen by Dr. Caryl Comes - has loop recorder in. Patient has declined amiodarone due to side effect profile  . Varicose veins     PAST SURGICAL HISTORY: Past Surgical History:  Procedure Laterality Date  . BASAL CELL CARCINOMA EXCISION     "several; scattered over my face, hands, leg some cut off; some burned off"  . CARDIAC CATHETERIZATION N/A 08/07/2015   Procedure: Left Heart Cath and Coronary Angiography;  Surgeon: [name], MD;  Location: [place] [facility];  Service: Cardiovascular;  Laterality: N/A;  . CARDIAC CATHETERIZATION N/A 08/07/2015   Procedure: Coronary Stent Intervention;  Surgeon: [name], MD;  Location: [place] [facility];  Service: Cardiovascular;  Laterality: N/A;  . CARDIAC CATHETERIZATION N/A 08/07/2015   Procedure: Intravascular Pressure Wire/FFR Study;  Surgeon: [name], MD;  Location: [place] [facility];  Service: Cardiovascular;  Laterality: N/A;  . CATARACT EXTRACTION W/ INTRAOCULAR LENS  IMPLANT, BILATERAL Bilateral 2005  . CLOSED REDUCTION SHOULDER DISLOCATION Left 2016 X 2  . CORNEAL TRANSPLANT Bilateral right 2009, left 2009    3 in left eye (last 2 failed), 1 in right eye  . DILATION AND CURETTAGE OF UTERUS    . EYE SURGERY    . JOINT REPLACEMENT    . [place]   "had gangrene in it"  . LOOP  RECORDER IMPLANT N/A 01/05/2014   Procedure: LOOP RECORDER IMPLANT;  Surgeon: Coralyn Mark, MD;  Location: [place] [facility];  Service: Cardiovascular;  Laterality: N/A;  . SQUAMOUS CELL CARCINOMA EXCISION     "several; scattered over my face, hands, leg some cut off; some burned off"  . TONSILLECTOMY    . TOTAL ABDOMINAL HYSTERECTOMY  11/1983   "ovaries and all"  . TOTAL KNEE ARTHROPLASTY Left 06/27/2012   Procedure: LEFT TOTAL KNEE ARTHROPLASTY;  Surgeon: Gearlean Alf, MD;  Location: [facility];  Service: Orthopedics;  Laterality: Left;  . TOTAL KNEE ARTHROPLASTY Right 12/12/2012   Procedure: RIGHT TOTAL KNEE ARTHROPLASTY;  Surgeon: Gearlean Alf, MD;  Location: [facility];  Service: Orthopedics;  Laterality: Right;  . TUBAL LIGATION  1966    MEDICATIONS AT HOME: Prior to Admission medications   Medication Sig Start Date End Date Taking? Authorizing Provider  clonazePAM (KLONOPIN) 0.5 MG tablet Take 0.25-5 mg by mouth See admin instructions. Take 1/2 tablet (0.25mg ) in the morning,  and 1 tablet (.5mg ) at night May take additional 1/2 tablet (.25mg ) in the afternoon if needed 03/10/18  Yes [provider]  clopidogrel (PLAVIX) 75 MG tablet Take 1 tablet (75 mg total) by mouth daily. Patient taking differently: Take 75 mg by mouth every morning.  12/30/17  Yes [name], MD  donepezil (ARICEPT) 5 MG tablet Take 7.5 mg by mouth every morning.  01/13/18  Yes [provider]  nitroGLYCERIN (NITROSTAT) 0.4 MG SL tablet Place 1 tablet (0.4 mg total) under the tongue every 5 (five) minutes as needed for chest pain. 08/08/15  Yes Rosita Fire, Brittainy M, PA-C  prednisoLONE acetate (PRED FORTE) 1 % ophthalmic suspension Place 1 drop into both eyes See admin instructions. 1 drop into both eyes at bedtime spaced 5 minutes apart from Systane gel   Yes [provider]  risperiDONE (RISPERDAL) 2 MG tablet Take 2 mg by mouth at bedtime.  01/13/18  Yes [provider]  sertraline  (ZOLOFT) 100 MG tablet Take 150 mg by mouth every morning.  10/28/17  Yes [provider]  traZODone (DESYREL) 50 MG tablet Take 25 mg by mouth at bedtime. 03/10/18  Yes [provider]    FAMILY HISTORY: Family History  Problem Relation Age of Onset  . Stroke Mother   . Heart attack Father   . Heart attack Brother   . Cancer Brother   . Heart attack Brother   . Heart attack Brother   . Heart attack Brother   . Parkinsonism Brother      SOCIAL HISTORY:  reports that she has never smoked. She has never used smokeless tobacco. She reports that she does not drink alcohol or use drugs.  PHYSICAL EXAM: Blood pressure (!) 174/86, pulse 71, temperature 98.5 F (36.9 C), temperature source Oral, resp. rate 20, height 5\' 2"  (1.575 m), weight 72.1 kg, SpO2 96 %.  General appearance :Awake, alert, not in any distress.  Very frail appearing-chronically sick looking. HEENT: Atraumatic and Normocephalic Neck: supple, no JVD.  Resp:Good air entry bilaterally, no added sounds  CVS: S1 S2 irregular-tachycardic GI: Bowel sounds present, Non tender and not distended with no gaurding, rigidity or rebound. Extremities: B/L Lower Ext shows no edema, both legs are warm to touch Neurology: Difficult exam-but seems to be moving all 4 extremities. Musculoskeletal:gait appears to be normal.No digital cyanosis Skin:No Rash, warm and dry Wounds:N/A  LABS ON ADMISSION:  I have personally reviewed following labs and imaging studies  CBC: Recent Labs  Lab 04/21/18 1201 04/22/18 0551  WBC 7.0 6.2  NEUTROABS 4.9  --   HGB 14.5 12.6  HCT 45.3 40.6  MCV 95.8 94.6  PLT 210 856    Basic Metabolic Panel: Recent Labs  Lab 04/21/18 1201 04/22/18 0551  NA 138 138  K 4.1 3.9  CL 104 103  CO2 26 27  GLUCOSE 139* 109*  BUN 21 19  CREATININE 0.95 0.76  CALCIUM 8.9 8.7*  MG 2.2  --     GFR: Estimated Creatinine Clearance: 42.6 mL/min (by C-G formula based on SCr of 0.76  mg/dL).  Liver Function Tests: No results for input(s): AST, ALT, ALKPHOS, BILITOT, PROT, ALBUMIN in the last 168 hours. No results for input(s): LIPASE, AMYLASE in the last 168 hours. No results for input(s): AMMONIA in the last 168 hours.  Coagulation Profile: No results for input(s): INR, PROTIME in the last 168 hours.  Cardiac Enzymes: No results for input(s): CKTOTAL, CKMB, CKMBINDEX, TROPONINI in the last  168 hours.  BNP (last 3 results) No results for input(s): PROBNP in the last 8760 hours.  HbA1C: No results for input(s): HGBA1C in the last 72 hours.  CBG: No results for input(s): GLUCAP in the last 168 hours.  Lipid Profile: No results for input(s): CHOL, HDL, LDLCALC, TRIG, CHOLHDL, LDLDIRECT in the last 72 hours.  Thyroid Function Tests: No results for input(s): TSH, T4TOTAL, FREET4, T3FREE, THYROIDAB in the last 72 hours.  Anemia Panel: No results for input(s): VITAMINB12, FOLATE, FERRITIN, TIBC, IRON, RETICCTPCT in the last 72 hours.  Urine analysis:    Component Value Date/Time   COLORURINE YELLOW 04/21/2018 1201   APPEARANCEUR CLEAR 04/21/2018 1201   LABSPEC 1.009 04/21/2018 1201   PHURINE 6.0 04/21/2018 1201   GLUCOSEU NEGATIVE 04/21/2018 1201   HGBUR NEGATIVE 04/21/2018 [place] 04/21/2018 [place] 04/21/2018 1201   PROTEINUR NEGATIVE 04/21/2018 1201   UROBILINOGEN 0.2 12/19/2014 1830   NITRITE NEGATIVE 04/21/2018 1201   LEUKOCYTESUR MODERATE (A) 04/21/2018 1201    Sepsis Labs: Lactic Acid, Venous    Component Value Date/Time   LATICACIDVEN 1.05 12/23/2012 1836     Microbiology: Recent Results (from the past 240 hour(s))  Urine culture     Status: Abnormal   Collection Time: 04/21/18  2:00 PM  Result Value Ref Range Status   Specimen Description URINE, CLEAN CATCH  Final   Special Requests   Final    NONE Performed at [facility], [place] [address], [place],  [address]    Culture (A)   Final    20,000 COLONIES/mL MULTIPLE SPECIES PRESENT, SUGGEST RECOLLECTION   Report Status 04/22/2018 FINAL  Final      RADIOLOGIC STUDIES ON ADMISSION: Ct Head Wo Contrast  Result Date: 04/21/2018 CLINICAL DATA:  Altered level of consciousness, history stroke, coronary artery disease post and high, hypertension, blindness LEFT eye EXAM: CT HEAD WITHOUT CONTRAST TECHNIQUE: Contiguous axial images were obtained from the base of the skull through the vertex without intravenous contrast. Sagittal and coronal MPR images reconstructed from axial data set. COMPARISON:  01/15/2018 FINDINGS: Brain: Generalized atrophy. Normal ventricular morphology. No midline shift or mass effect. Small vessel chronic ischemic changes of deep cerebral white matter. Large old LEFT temporoparietal infarct. Old RIGHT occipital infarct. Old RIGHT thalamic infarct. No intracranial hemorrhage, mass lesion, or evidence of acute infarction. No extra-axial fluid collections. Vascular: Minimal atherosclerotic calcifications of internal carotid arteries at skull base. No hyperdense vessels. Skull: Intact Sinuses/Orbits: Clear Other: N/A IMPRESSION: Atrophy with small vessel chronic ischemic changes of deep cerebral white matter. Old LEFT temporoparietal, RIGHT occipital and RIGHT thalamic infarcts. No acute intracranial abnormalities. Electronically Signed   By: Lavonia Dana M.D.   On: 04/21/2018 14:37   Dg Chest Portable 1 View  Result Date: 04/21/2018 CLINICAL DATA:  Tachycardia EXAM: PORTABLE CHEST 1 VIEW COMPARISON:  December 13, 2017 FINDINGS: The heart size and mediastinal contours are stable. The heart size is enlarged. The aorta is tortuous. Both lungs are clear. The visualized skeletal structures are stable. IMPRESSION: No active cardiopulmonary disease.  Cardiomegaly. Electronically Signed   By: Abelardo Diesel M.D.   On: 04/21/2018 13:11   I have personally reviewed images of chest xray-no consolidation  EKG:  Personally  reviewed.  A. fib with RVR  ASSESSMENT AND PLAN: Paroxysmal A. fib with RVR: We will attempt rate control with Cardizem as recommended by cardiology.  Patient is very frail-has had prior falls-has had GI bleeding  when she was recently started on Eliquis-hence not a good long-term candidate for further anticoagulation.  Long discussion with the patient's daughter at bedside-continue Plavix.  Monitor in cardiac telemetry-and gently optimize rate control with Cardizem-we will utilize IV Lopressor as needed.  History of CVA with residual expressive aphasia and right visual field cut: Continue Plavix.  History of CAD: Left chest pain yesterday-but troponin is negative.  Continue supportive care-follow-up.  Hypertension: We will hold other antihypertensives as recommended by cardiology-to allow room for titration of Cardizem.  Dementia with behavioral disturbances: At risk for further delirium during this hospital stay-continue Aricept, Risperdal, Zoloft and trazodone.  Resume Klonopin per home regimen.  Deconditioning/debility/frailty in the setting of advanced dementia: Lives at home with 24/7 care.  Just about able to walk with some assistance.  Long discussion with daughter-okay for gentle medical treatment.  DNR in place.  Further plan will depend as patient's clinical course evolves and further radiologic and laboratory data become available. Patient will be monitored closely.  Above noted plan was discussed with daughter face to face at bedside, she was in agreement.   CONSULTS: None  DVT Prophylaxis: Prophylactic Lovenox   Code Status: DNR (golden yellow DNR form/paperwork at bedside)  Disposition Plan:  Discharge back home possibly in 1-2 days, depending on clinical course  Admission status: Observation going to tele  Total time spent  55 minutes.Greater than 50% of this time was spent in counseling, explanation of diagnosis, planning of further management, and coordination of  care.  Oren Binet [facility] Pager [phone]  If 7PM-7AM, please contact night-coverage [email or web address] 04/22/2018, 3:59 PM

## 2018-04-22 NOTE — Discharge Summary (Signed)
[name] [name], is a 85 y.o. female  DOB 1924-10-04  MRN [id number].  Admission date:  04/21/2018  Admitting Physician  Jonetta Osgood, MD  Discharge Date:  04/22/2018   Primary MD  Darcus Austin, MD (Inactive)  Recommendations for primary care physician for things to follow:  -Follow on the final results of urine cultures   Admission Diagnosis  Atrial flutter, paroxysmal [facility]) [I48.92]   Discharge Diagnosis  Atrial flutter, paroxysmal ([place]) [I48.92]   Principal Problem:   Atrial fibrillation with RVR ([place]) Active Problems:   Essential hypertension   Blind left eye- (Fuch's disease)   Visual field cut- right   Acute CVA (cerebrovascular accident) ([place])   Seizures ([place])   Vascular dementia with behavior disturbance ([place])      Past Medical History:  Diagnosis Date  . Arthritis    "thumbs, joints" (03/23/2017  . Atrial fibrillation ([place]) 04/21/2018  . Basal cell carcinoma    "several; scattered over my face, hands, leg some cut off; some burned off"  . Blind left eye    a. Corneal transplant x3 with rejection  . CAD (coronary artery disease)    a. s/p NSTEMI 2015 treated conservatively; nuclear stress test low risk. b. Continued angina despite med rx 07/2015 - s/p Bio Freedom Stent to ramus intermedius with mod LAD stenosis neg by FFR. // Nuc study 5/19:  EF 67, no ischemia or scar; Low Risk   . Complication of anesthesia    "very sensitive to RX"  . Diverticulitis large intestine   . Diverticulosis   . Family history of adverse reaction to anesthesia    "we all get PONV" (1/1/201)  . Gastritis   . GERD (gastroesophageal reflux disease)   . Hayfever   . Helicobacter pylori (H. pylori) 05/22/02   RUT-Positive  . Hypertension   . Myocardial infarction ([place]) 2015  . Squamous carcinoma    "several; scattered over my face, hands, leg some cut off; some burned off"  . Stroke [facility]) 2015   denies residual on 08/07/2015  . Stroke [facility]) 03/18/2017   "has effected her vision, balance, some memory" (03/23/2017)  . SVT (supraventricular tachycardia) (H[facility])    a. seen by Dr. Caryl Comes - has loop recorder in. Patient has declined amiodarone due to side effect profile  . Varicose veins     Past Surgical History:  Procedure Laterality Date  . BASAL CELL CARCINOMA EXCISION     "several; scattered over my face, hands, leg some cut off; some burned off"  . CARDIAC CATHETERIZATION N/A 08/07/2015   Procedure: Left Heart Cath and Coronary Angiography;  Surgeon: [name], MD;  Location: [place] CV LAB;  Service: Cardiovascular;  Laterality: N/A;  . CARDIAC CATHETERIZATION N/A 08/07/2015   Procedure: Coronary Stent Intervention;  Surgeon: [name], MD;  Location: [place] CV LAB;  Service: Cardiovascular;  Laterality: N/A;  . CARDIAC CATHETERIZATION N/A 08/07/2015   Procedure: Intravascular Pressure Wire/FFR Study;  Surgeon: [name], MD;  Location: [place] CV LAB;  Service:  Cardiovascular;  Laterality: N/A;  . CATARACT EXTRACTION W/ INTRAOCULAR LENS  IMPLANT, BILATERAL Bilateral 2005  . CLOSED REDUCTION SHOULDER DISLOCATION Left 2016 X 2  . CORNEAL TRANSPLANT Bilateral right 2009, left 2009    3 in left eye (last 2 failed), 1 in right eye  . DILATION AND CURETTAGE OF UTERUS    . EYE SURGERY    . JOINT REPLACEMENT    . [place]   "had gangrene in it"  . LOOP RECORDER IMPLANT N/A 01/05/2014   Procedure: LOOP RECORDER IMPLANT;  Surgeon: Coralyn Mark, MD;  Location: [place] CATH LAB;  Service: Cardiovascular;  Laterality: N/A;  . SQUAMOUS CELL CARCINOMA EXCISION     "several; scattered over my face, hands, leg some cut off; some burned off"  . TONSILLECTOMY    . TOTAL ABDOMINAL HYSTERECTOMY  11/1983   "ovaries and all"  . TOTAL KNEE ARTHROPLASTY Left 06/27/2012   Procedure: LEFT TOTAL KNEE ARTHROPLASTY;  Surgeon: Gearlean Alf, MD;  Location: [facility]  ORS;  Service: Orthopedics;  Laterality: Left;  . TOTAL KNEE ARTHROPLASTY Right 12/12/2012   Procedure: RIGHT TOTAL KNEE ARTHROPLASTY;  Surgeon: Gearlean Alf, MD;  Location: [facility] ORS;  Service: Orthopedics;  Laterality: Right;  . TUBAL LIGATION  1966       History of present illness and  Hospital Course:     Kindly see H&P for history of present illness and admission details, please review complete Labs, Consult reports and Test reports for all details in brief  HPI  from the history and physical done on the day of admission 04/21/2018 [name] [name] is a 85 y.o. 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  Apparently, yesterday evening-patient indicated that she was having chest pain.  She took 2 tablets of nitroglycerin with relief.  Vital signs taken at home showed heart rate in the 130s, and the blood pressure reportedly was in the 50K systolic.  Patient went to bed, this morning she was hard to arouse and she felt lethargic.  Found to be persistently tachycardic by her caregivers at home-and subsequently brought to the emergency room.  In serum, patient was found to be in A. fib with RVR.  Cardiology was consulted and the hospitalist service was asked to admit this patient for further evaluation.  Note-due to advanced dementia-expressive aphasia-patient is not able to participate in the history taking process.  Most of this history is obtained after discussion with the patient's daughter at bedside.  There is no history of fever, nausea, vomiting, shortness of breath.  ED Course:  Cardiology consulted-started on oral Cardizem.   Hospital Course   Paroxysmal A. fib with RVR -Heart rate uncontrolled on  presentation, she did require Cardizem drip, she did convert to normal sinus rhythm overnight, her Cardizem has been consolidated to Cardizem CD 180 mg oral daily, currently she is normal sinus rhythm on telemetry, heart rate is controlled. - CHA2DS2-VASc Score and unadjusted Ischemic Stroke Rate (% per year) is equal to9.7 % stroke rate/year from a score of 6(HTN, MI, age2, stroke2), however patient has multiple high risk features for anticoagulation including blindness, prior falls and history of GI bleed on apixaban.  Cardiology discussed risk/benefit of anticoagulation with family and they have elected not  to anticoagulate.   UTI -Is quite demented, cannot provide any reliable complaints of dysuria or polyuria, UA is positive, will be given Rocephin 1 dose before discharge, and to continue another 2 days of Suprax for total of 3 days treatment  History of CVA, nonhemorrhagic, with residual expressive aphasia and right visual field cut: Continue Plavix.  History of CAD: Left chest pain yesterday-but troponin is negative.  Continue supportive care-follow-up.  Hypertension: We will hold other antihypertensives as recommended by cardiology-to allow room for  Cardizem.  Dementia with behavioral disturbances: At risk for further delirium during this hospital stay-continue Aricept, Risperdal, Zoloft and trazodone.  Resume Klonopin per home regimen.  Deconditioning/debility/frailty in the setting of advanced dementia: Lives at home with 24/7 care.  Just about able to walk with some assistance.    Discharge Condition:  stable   Follow UP  Follow-up Information    Richardson Dopp T, PA-C Follow up.   Specialties:  Cardiology, Physician Assistant Why:  Cardiology hospital follow-up on 06/10/2018 at 11:45 AM.  Please arrive 10 minutes early for check-in. Contact information: [address] [address] [phone]             Discharge Instructions  and   Discharge Medications    Discharge Instructions    Discharge instructions   Complete by:  As directed    Follow with Primary MD Darcus Austin, MD (Inactive) in 7 days    Activity: As tolerated with Full fall precautions use walker/cane & assistance as needed   Disposition Home    Diet: Heart Healthy  , with feeding assistance and aspiration precautions.  For Heart failure patients - Check your Weight same time everyday, if you gain over 2 pounds, or you develop in leg swelling, experience more shortness of breath or chest pain, call your Primary MD immediately. Follow Cardiac Low Salt Diet and 1.5 lit/day fluid restriction.   On your next visit with your primary care physician please Get Medicines reviewed and adjusted.   Please request your Prim.MD to go over all Hospital Tests and Procedure/Radiological results at the follow up, please get all Hospital records sent to your Prim MD by signing hospital release before you go home.   If you experience worsening of your admission symptoms, develop shortness of breath, life threatening emergency, suicidal or homicidal thoughts you must seek medical attention immediately by calling 911 or calling your MD immediately  if symptoms less severe.  You Must read complete instructions/literature along with all the possible adverse reactions/side effects for all the Medicines you take and that have been prescribed to you. Take any new Medicines after you have completely understood and accpet all the possible adverse reactions/side effects.   Do not drive, operating heavy machinery, perform activities at heights, swimming or participation in water activities or provide baby sitting services if your were admitted for syncope or siezures until you have seen by Primary MD or a Neurologist and advised to do so again.  Do not drive when taking Pain medications.    Do not take more than prescribed Pain, Sleep and Anxiety Medications  Special  Instructions: If you have smoked or chewed Tobacco  in the last 2 yrs please stop smoking, stop any regular Alcohol  and or any Recreational drug use.  Wear Seat belts while driving.   Please note  You were cared for by a hospitalist during your hospital stay. If you have any questions about your discharge medications or the  care you received while you were in the hospital after you are discharged, you can call the unit and asked to speak with the hospitalist on call if the hospitalist that took care of you is not available. Once you are discharged, your primary care physician will handle any further medical issues. Please note that NO REFILLS for any discharge medications will be authorized once you are discharged, as it is imperative that you return to your primary care physician (or establish a relationship with a primary care physician if you do not have one) for your aftercare needs so that they can reassess your need for medications and monitor your lab values.   Increase activity slowly   Complete by:  As directed      Allergies as of 04/22/2018      Reactions   Fluorescein Anaphylaxis, Shortness Of Breath, Itching, Swelling   Hydralazine Hcl Anaphylaxis, Swelling, Palpitations, Rash   Sulfa Antibiotics Rash, Other (See Comments)   Other Reaction: red bumps   Ultram [tramadol] Hives   Yellow Dye Anaphylaxis, Itching, Swelling, Other (See Comments)   Fluorescein (in eye drops)   Buprenex [buprenorphine Hcl] Palpitations, Other (See Comments)   Keflex [cephalexin] Diarrhea   Lipitor [atorvastatin] Other (See Comments)   Muscle weakness   Restasis [cyclosporine] Swelling   redness of face with slight swelling    Augmentin [amoxicillin-pot Clavulanate] Nausea And Vomiting   Levofloxacin Other (See Comments)   Reaction not recalled   Morphine And Related Other (See Comments)   agitation    Percocet [oxycodone-acetaminophen] Nausea And Vomiting   Tape Other (See Comments)   Adhesive  tape pulls off the skin Able to tolerate paper tape   Zetia [ezetimibe] Other (See Comments)   Myalgias      Medication List    TAKE these medications   cefixime 400 MG Caps capsule Commonly known as:  SUPRAX Take 1 capsule (400 mg total) by mouth daily. Start taking on:  April 23, 2018   clonazePAM 0.5 MG tablet Commonly known as:  KLONOPIN Take 0.25-5 mg by mouth See admin instructions. Take 1/2 tablet (0.25mg ) in the morning, and 1 tablet (.5mg ) at night May take additional 1/2 tablet (.25mg ) in the afternoon if needed   clopidogrel 75 MG tablet Commonly known as:  PLAVIX Take 1 tablet (75 mg total) by mouth daily. What changed:  when to take this   diltiazem 180 MG 24 hr capsule Commonly known as:  CARDIZEM CD Take 1 capsule (180 mg total) by mouth daily.   donepezil 5 MG tablet Commonly known as:  ARICEPT Take 7.5 mg by mouth every morning.   nitroGLYCERIN 0.4 MG SL tablet Commonly known as:  NITROSTAT Place 1 tablet (0.4 mg total) under the tongue every 5 (five) minutes as needed for chest pain.   prednisoLONE acetate 1 % ophthalmic suspension Commonly known as:  PRED FORTE Place 1 drop into both eyes See admin instructions. 1 drop into both eyes at bedtime spaced 5 minutes apart from Systane gel   risperiDONE 2 MG tablet Commonly known as:  RISPERDAL Take 2 mg by mouth at bedtime.   sertraline 100 MG tablet Commonly known as:  ZOLOFT Take 150 mg by mouth every morning.   traZODone 50 MG tablet Commonly known as:  DESYREL Take 25 mg by mouth at bedtime.         Diet and Activity recommendation: See Discharge Instructions above   Consults obtained -  Cardiology   Major procedures and  Radiology Reports - PLEASE review detailed and final reports for all details, in brief -    Ct Head Wo Contrast  Result Date: 04/21/2018 CLINICAL DATA:  Altered level of consciousness, history stroke, coronary artery disease post and high, hypertension, blindness  LEFT eye EXAM: CT HEAD WITHOUT CONTRAST TECHNIQUE: Contiguous axial images were obtained from the base of the skull through the vertex without intravenous contrast. Sagittal and coronal MPR images reconstructed from axial data set. COMPARISON:  01/15/2018 FINDINGS: Brain: Generalized atrophy. Normal ventricular morphology. No midline shift or mass effect. Small vessel chronic ischemic changes of deep cerebral white matter. Large old LEFT temporoparietal infarct. Old RIGHT occipital infarct. Old RIGHT thalamic infarct. No intracranial hemorrhage, mass lesion, or evidence of acute infarction. No extra-axial fluid collections. Vascular: Minimal atherosclerotic calcifications of internal carotid arteries at skull base. No hyperdense vessels. Skull: Intact Sinuses/Orbits: Clear Other: N/A IMPRESSION: Atrophy with small vessel chronic ischemic changes of deep cerebral white matter. Old LEFT temporoparietal, RIGHT occipital and RIGHT thalamic infarcts. No acute intracranial abnormalities. Electronically Signed   By: Lavonia Dana M.D.   On: 04/21/2018 14:37   Dg Chest Portable 1 View  Result Date: 04/21/2018 CLINICAL DATA:  Tachycardia EXAM: PORTABLE CHEST 1 VIEW COMPARISON:  December 13, 2017 FINDINGS: The heart size and mediastinal contours are stable. The heart size is enlarged. The aorta is tortuous. Both lungs are clear. The visualized skeletal structures are stable. IMPRESSION: No active cardiopulmonary disease.  Cardiomegaly. Electronically Signed   By: Abelardo Diesel M.D.   On: 04/21/2018 13:11    Micro Results     No results found for this or any previous visit (from the past 240 hour(s)).     Today   Subjective:   [place] demented, cannot provide any complaints, caregiver at bedside reports diarrhea this morning, otherwise no significant events .  Objective:   Blood pressure (!) 145/68, pulse 75, temperature 97.7 F (36.5 C), temperature source Oral, resp. rate 20, height 5\' 2"  (1.575  m), weight 72.1 kg, SpO2 95 %.   Intake/Output Summary (Last 24 hours) at 04/22/2018 1058 Last data filed at 04/22/2018 1033 Gross per 24 hour  Intake 1719.99 ml  Output -  Net 1719.99 ml    Exam Awake, confused, demented, cannot answer questions appropriately Patient is legally blind Symmetrical Chest wall movement, Good air movement bilaterally, CTAB RRR,No Gallops,Rubs or new Murmurs, No Parasternal Heave +ve B.Sounds, Abd Soft, Non tender, , No rebound -guarding or rigidity. No Cyanosis, Clubbing or edema, No new Rash or bruise  Data Review   CBC w Diff:  Lab Results  Component Value Date   WBC 6.2 04/22/2018   HGB 12.6 04/22/2018   HGB 14.2 08/10/2017   HCT 40.6 04/22/2018   HCT 42.4 08/10/2017   PLT 184 04/22/2018   PLT 231 08/10/2017   LYMPHOPCT 18 04/21/2018   MONOPCT 10 04/21/2018   EOSPCT 1 04/21/2018   BASOPCT 1 04/21/2018    CMP:  Lab Results  Component Value Date   NA 138 04/22/2018   NA 140 08/10/2017   K 3.9 04/22/2018   CL 103 04/22/2018   CO2 27 04/22/2018   BUN 19 04/22/2018   BUN 20 08/10/2017   CREATININE 0.76 04/22/2018   PROT 7.1 12/13/2017   ALBUMIN 4.1 12/13/2017   BILITOT 0.6 12/13/2017   ALKPHOS 83 12/13/2017   AST 28 12/13/2017   ALT 20 12/13/2017  .   Total Time in preparing paper work, data evaluation and  todays exam - 24 minutes  Phillips Climes M.D on 04/22/2018 at 10:58 AM  [facility]   Office  [phone]

## 2018-04-26 ENCOUNTER — Emergency Department (HOSPITAL_COMMUNITY): Payer: PPO

## 2018-04-26 ENCOUNTER — Encounter (HOSPITAL_COMMUNITY): Payer: Self-pay

## 2018-04-26 ENCOUNTER — Inpatient Hospital Stay (HOSPITAL_COMMUNITY)
Admission: EM | Admit: 2018-04-26 | Discharge: 2018-05-11 | DRG: 100 | Disposition: A | Discharge status: Hospice | Payer: PPO | Attending: Family Medicine | Admitting: Family Medicine

## 2018-04-26 DIAGNOSIS — R4020 Unspecified coma: Secondary | ICD-10-CM | POA: Diagnosis not present

## 2018-04-26 DIAGNOSIS — R1310 Dysphagia, unspecified: Secondary | ICD-10-CM | POA: Diagnosis present

## 2018-04-26 DIAGNOSIS — Z8719 Personal history of other diseases of the digestive system: Secondary | ICD-10-CM

## 2018-04-26 DIAGNOSIS — I252 Old myocardial infarction: Secondary | ICD-10-CM

## 2018-04-26 DIAGNOSIS — I69311 Memory deficit following cerebral infarction: Secondary | ICD-10-CM

## 2018-04-26 DIAGNOSIS — G9341 Metabolic encephalopathy: Secondary | ICD-10-CM

## 2018-04-26 DIAGNOSIS — H5440 Blindness, one eye, unspecified eye: Secondary | ICD-10-CM | POA: Diagnosis present

## 2018-04-26 DIAGNOSIS — J984 Other disorders of lung: Secondary | ICD-10-CM | POA: Diagnosis not present

## 2018-04-26 DIAGNOSIS — G40901 Epilepsy, unspecified, not intractable, with status epilepticus: Secondary | ICD-10-CM | POA: Diagnosis not present

## 2018-04-26 DIAGNOSIS — M19042 Primary osteoarthritis, left hand: Secondary | ICD-10-CM | POA: Diagnosis present

## 2018-04-26 DIAGNOSIS — H539 Unspecified visual disturbance: Secondary | ICD-10-CM | POA: Diagnosis not present

## 2018-04-26 DIAGNOSIS — Z66 Do not resuscitate: Secondary | ICD-10-CM | POA: Diagnosis present

## 2018-04-26 DIAGNOSIS — R000 Tachycardia, unspecified: Secondary | ICD-10-CM | POA: Diagnosis not present

## 2018-04-26 DIAGNOSIS — T17908A Unspecified foreign body in respiratory tract, part unspecified causing other injury, initial encounter: Secondary | ICD-10-CM

## 2018-04-26 DIAGNOSIS — A419 Sepsis, unspecified organism: Secondary | ICD-10-CM | POA: Diagnosis not present

## 2018-04-26 DIAGNOSIS — Z91048 Other nonmedicinal substance allergy status: Secondary | ICD-10-CM

## 2018-04-26 DIAGNOSIS — G40801 Other epilepsy, not intractable, with status epilepticus: Secondary | ICD-10-CM | POA: Diagnosis not present

## 2018-04-26 DIAGNOSIS — T86840 Corneal transplant rejection: Secondary | ICD-10-CM | POA: Diagnosis not present

## 2018-04-26 DIAGNOSIS — E872 Acidosis: Secondary | ICD-10-CM | POA: Diagnosis present

## 2018-04-26 DIAGNOSIS — Z885 Allergy status to narcotic agent status: Secondary | ICD-10-CM

## 2018-04-26 DIAGNOSIS — R0902 Hypoxemia: Secondary | ICD-10-CM | POA: Diagnosis not present

## 2018-04-26 DIAGNOSIS — I2699 Other pulmonary embolism without acute cor pulmonale: Secondary | ICD-10-CM | POA: Diagnosis not present

## 2018-04-26 DIAGNOSIS — Z8673 Personal history of transient ischemic attack (TIA), and cerebral infarction without residual deficits: Secondary | ICD-10-CM | POA: Diagnosis not present

## 2018-04-26 DIAGNOSIS — R404 Transient alteration of awareness: Secondary | ICD-10-CM | POA: Diagnosis not present

## 2018-04-26 DIAGNOSIS — Z888 Allergy status to other drugs, medicaments and biological substances status: Secondary | ICD-10-CM

## 2018-04-26 DIAGNOSIS — Z87892 Personal history of anaphylaxis: Secondary | ICD-10-CM

## 2018-04-26 DIAGNOSIS — F0151 Vascular dementia with behavioral disturbance: Secondary | ICD-10-CM | POA: Diagnosis present

## 2018-04-26 DIAGNOSIS — E7220 Disorder of urea cycle metabolism, unspecified: Secondary | ICD-10-CM | POA: Diagnosis not present

## 2018-04-26 DIAGNOSIS — I480 Paroxysmal atrial fibrillation: Secondary | ICD-10-CM | POA: Diagnosis present

## 2018-04-26 DIAGNOSIS — Z6831 Body mass index (BMI) 31.0-31.9, adult: Secondary | ICD-10-CM

## 2018-04-26 DIAGNOSIS — Z820 Family history of epilepsy and other diseases of the nervous system: Secondary | ICD-10-CM

## 2018-04-26 DIAGNOSIS — R569 Unspecified convulsions: Secondary | ICD-10-CM | POA: Diagnosis not present

## 2018-04-26 DIAGNOSIS — I10 Essential (primary) hypertension: Secondary | ICD-10-CM | POA: Diagnosis present

## 2018-04-26 DIAGNOSIS — R402431 Glasgow coma scale score 3-8, in the field [EMT or ambulance]: Secondary | ICD-10-CM | POA: Diagnosis not present

## 2018-04-26 DIAGNOSIS — F0150 Vascular dementia without behavioral disturbance: Secondary | ICD-10-CM | POA: Diagnosis present

## 2018-04-26 DIAGNOSIS — Z9049 Acquired absence of other specified parts of digestive tract: Secondary | ICD-10-CM

## 2018-04-26 DIAGNOSIS — R0682 Tachypnea, not elsewhere classified: Secondary | ICD-10-CM | POA: Diagnosis not present

## 2018-04-26 DIAGNOSIS — D62 Acute posthemorrhagic anemia: Secondary | ICD-10-CM | POA: Diagnosis not present

## 2018-04-26 DIAGNOSIS — I440 Atrioventricular block, first degree: Secondary | ICD-10-CM | POA: Diagnosis not present

## 2018-04-26 DIAGNOSIS — I69398 Other sequelae of cerebral infarction: Secondary | ICD-10-CM | POA: Diagnosis not present

## 2018-04-26 DIAGNOSIS — Z9842 Cataract extraction status, left eye: Secondary | ICD-10-CM

## 2018-04-26 DIAGNOSIS — H543 Unqualified visual loss, both eyes: Secondary | ICD-10-CM | POA: Diagnosis not present

## 2018-04-26 DIAGNOSIS — R4182 Altered mental status, unspecified: Secondary | ICD-10-CM | POA: Diagnosis not present

## 2018-04-26 DIAGNOSIS — G40911 Epilepsy, unspecified, intractable, with status epilepticus: Secondary | ICD-10-CM | POA: Diagnosis not present

## 2018-04-26 DIAGNOSIS — Z7902 Long term (current) use of antithrombotics/antiplatelets: Secondary | ICD-10-CM

## 2018-04-26 DIAGNOSIS — Z8249 Family history of ischemic heart disease and other diseases of the circulatory system: Secondary | ICD-10-CM

## 2018-04-26 DIAGNOSIS — J9811 Atelectasis: Secondary | ICD-10-CM | POA: Diagnosis not present

## 2018-04-26 DIAGNOSIS — I6523 Occlusion and stenosis of bilateral carotid arteries: Secondary | ICD-10-CM | POA: Diagnosis not present

## 2018-04-26 DIAGNOSIS — R509 Fever, unspecified: Secondary | ICD-10-CM

## 2018-04-26 DIAGNOSIS — K922 Gastrointestinal hemorrhage, unspecified: Secondary | ICD-10-CM | POA: Diagnosis not present

## 2018-04-26 DIAGNOSIS — Z85828 Personal history of other malignant neoplasm of skin: Secondary | ICD-10-CM

## 2018-04-26 DIAGNOSIS — Z882 Allergy status to sulfonamides status: Secondary | ICD-10-CM

## 2018-04-26 DIAGNOSIS — H5462 Unqualified visual loss, left eye, normal vision right eye: Secondary | ICD-10-CM | POA: Diagnosis present

## 2018-04-26 DIAGNOSIS — I2510 Atherosclerotic heart disease of native coronary artery without angina pectoris: Secondary | ICD-10-CM | POA: Diagnosis not present

## 2018-04-26 DIAGNOSIS — G40211 Localization-related (focal) (partial) symptomatic epilepsy and epileptic syndromes with complex partial seizures, intractable, with status epilepticus: Secondary | ICD-10-CM | POA: Diagnosis not present

## 2018-04-26 DIAGNOSIS — Z881 Allergy status to other antibiotic agents status: Secondary | ICD-10-CM

## 2018-04-26 DIAGNOSIS — I6602 Occlusion and stenosis of left middle cerebral artery: Secondary | ICD-10-CM | POA: Diagnosis not present

## 2018-04-26 DIAGNOSIS — J690 Pneumonitis due to inhalation of food and vomit: Secondary | ICD-10-CM | POA: Diagnosis not present

## 2018-04-26 DIAGNOSIS — Z823 Family history of stroke: Secondary | ICD-10-CM

## 2018-04-26 DIAGNOSIS — F01518 Vascular dementia, unspecified severity, with other behavioral disturbance: Secondary | ICD-10-CM | POA: Diagnosis present

## 2018-04-26 DIAGNOSIS — J9601 Acute respiratory failure with hypoxia: Secondary | ICD-10-CM | POA: Diagnosis not present

## 2018-04-26 DIAGNOSIS — R64 Cachexia: Secondary | ICD-10-CM | POA: Diagnosis present

## 2018-04-26 DIAGNOSIS — I82409 Acute embolism and thrombosis of unspecified deep veins of unspecified lower extremity: Secondary | ICD-10-CM | POA: Diagnosis not present

## 2018-04-26 DIAGNOSIS — Z961 Presence of intraocular lens: Secondary | ICD-10-CM | POA: Diagnosis present

## 2018-04-26 DIAGNOSIS — Z79899 Other long term (current) drug therapy: Secondary | ICD-10-CM

## 2018-04-26 DIAGNOSIS — Z87440 Personal history of urinary (tract) infections: Secondary | ICD-10-CM

## 2018-04-26 DIAGNOSIS — H47611 Cortical blindness, right side of brain: Secondary | ICD-10-CM | POA: Diagnosis not present

## 2018-04-26 DIAGNOSIS — Z90710 Acquired absence of both cervix and uterus: Secondary | ICD-10-CM

## 2018-04-26 DIAGNOSIS — Z9851 Tubal ligation status: Secondary | ICD-10-CM

## 2018-04-26 DIAGNOSIS — Z9841 Cataract extraction status, right eye: Secondary | ICD-10-CM

## 2018-04-26 DIAGNOSIS — Z96653 Presence of artificial knee joint, bilateral: Secondary | ICD-10-CM | POA: Diagnosis present

## 2018-04-26 DIAGNOSIS — H5347 Heteronymous bilateral field defects: Secondary | ICD-10-CM | POA: Diagnosis present

## 2018-04-26 DIAGNOSIS — R918 Other nonspecific abnormal finding of lung field: Secondary | ICD-10-CM | POA: Diagnosis not present

## 2018-04-26 DIAGNOSIS — M19041 Primary osteoarthritis, right hand: Secondary | ICD-10-CM | POA: Diagnosis present

## 2018-04-26 DIAGNOSIS — I4891 Unspecified atrial fibrillation: Secondary | ICD-10-CM | POA: Diagnosis not present

## 2018-04-26 LAB — PROTIME-INR
INR: 1.08
Prothrombin Time: 14 seconds (ref 11.4–15.2)

## 2018-04-26 LAB — COMPREHENSIVE METABOLIC PANEL
ALT: 18 U/L (ref 0–44)
AST: 47 U/L — ABNORMAL HIGH (ref 15–41)
Albumin: 3.5 g/dL (ref 3.5–5.0)
Alkaline Phosphatase: 86 U/L (ref 38–126)
Anion gap: 18 — ABNORMAL HIGH (ref 5–15)
BUN: 18 mg/dL (ref 8–23)
CO2: 18 mmol/L — AB (ref 22–32)
Calcium: 9.1 mg/dL (ref 8.9–10.3)
Chloride: 102 mmol/L (ref 98–111)
Creatinine, Ser: 0.82 mg/dL (ref 0.44–1.00)
GFR calc Af Amer: >60 mL/min (ref >60)
GFR calc non Af Amer: >60 mL/min (ref >60)
Glucose, Bld: 161 mg/dL — ABNORMAL HIGH (ref 70–99)
Potassium: 4.9 mmol/L (ref 3.5–5.1)
SODIUM: 138 mmol/L (ref 135–145)
Total Bilirubin: 1.5 mg/dL — ABNORMAL HIGH (ref 0.3–1.2)
Total Protein: 6.3 g/dL — ABNORMAL LOW (ref 6.5–8.1)

## 2018-04-26 LAB — DIFFERENTIAL
Abs Immature Granulocytes: 0.02 10*3/uL (ref 0.00–0.07)
BASOS ABS: 0 10*3/uL (ref 0.0–0.1)
BASOS PCT: 1 %
Eosinophils Absolute: 0.1 10*3/uL (ref 0.0–0.5)
Eosinophils Relative: 1 %
Immature Granulocytes: 0 %
Lymphocytes Relative: 24 %
Lymphs Abs: 1.5 10*3/uL (ref 0.7–4.0)
Monocytes Absolute: 0.6 10*3/uL (ref 0.1–1.0)
Monocytes Relative: 9 %
NEUTROS PCT: 65 %
Neutro Abs: 4.1 10*3/uL (ref 1.7–7.7)

## 2018-04-26 LAB — CBC
HCT: 43.7 % (ref 36.0–46.0)
Hemoglobin: 13.4 g/dL (ref 12.0–15.0)
MCH: 29.9 pg (ref 26.0–34.0)
MCHC: 30.7 g/dL (ref 30.0–36.0)
MCV: 97.5 fL (ref 80.0–100.0)
PLATELETS: 209 10*3/uL (ref 150–400)
RBC: 4.48 MIL/uL (ref 3.87–5.11)
RDW: 13.3 % (ref 11.5–15.5)
WBC: 6.3 10*3/uL (ref 4.0–10.5)
nRBC: 0 % (ref 0.0–0.2)

## 2018-04-26 LAB — LACTIC ACID, PLASMA
LACTIC ACID, VENOUS: 3.5 mmol/L — AB (ref 0.5–1.9)
Lactic Acid, Venous: 4.3 mmol/L (ref 0.5–1.9)

## 2018-04-26 LAB — CBG MONITORING, ED: GLUCOSE-CAPILLARY: 137 mg/dL — AB (ref 70–99)

## 2018-04-26 LAB — I-STAT TROPONIN, ED: Troponin i, poc: 0 ng/mL (ref 0.00–0.08)

## 2018-04-26 LAB — ETHANOL: Alcohol, Ethyl (B): <10 mg/dL (ref <10)

## 2018-04-26 LAB — APTT: aPTT: 27 seconds (ref 24–36)

## 2018-04-26 LAB — I-STAT CREATININE, ED: CREATININE: 0.5 mg/dL (ref 0.44–1.00)

## 2018-04-26 MED ORDER — SODIUM CHLORIDE 0.9 % IV SOLN
200.0000 mg | Freq: Once | INTRAVENOUS | Status: AC
  Administered 2018-04-26: 200 mg via INTRAVENOUS
  Filled 2018-04-26: qty 20

## 2018-04-26 MED ORDER — SODIUM CHLORIDE 0.9 % IV SOLN
100.0000 mg | Freq: Two times a day (BID) | INTRAVENOUS | Status: DC
  Administered 2018-04-27 – 2018-05-04 (×15): 100 mg via INTRAVENOUS
  Filled 2018-04-26 (×18): qty 10

## 2018-04-26 MED ORDER — ONDANSETRON HCL 4 MG PO TABS: 4.0000 mg | ORAL_TABLET | Freq: Four times a day (QID) | ORAL | Status: DC | PRN

## 2018-04-26 MED ORDER — LORAZEPAM 2 MG/ML IJ SOLN
1.0000 mg | Freq: Four times a day (QID) | INTRAMUSCULAR | Status: DC | PRN
  Filled 2018-04-26: qty 1

## 2018-04-26 MED ORDER — LABETALOL HCL 5 MG/ML IV SOLN: 10.0000 mg | INTRAVENOUS | Status: DC | PRN

## 2018-04-26 MED ORDER — SODIUM CHLORIDE 0.9 % IV SOLN
20.0000 mg | Freq: Once | INTRAVENOUS | Status: DC
  Filled 2018-04-26: qty 2

## 2018-04-26 MED ORDER — ONDANSETRON HCL 4 MG/2ML IJ SOLN: 4.0000 mg | Freq: Four times a day (QID) | INTRAMUSCULAR | Status: DC | PRN

## 2018-04-26 MED ORDER — SODIUM CHLORIDE 0.9 % IV SOLN
100.0000 mg | Freq: Once | INTRAVENOUS | Status: AC
  Administered 2018-04-27: 100 mg via INTRAVENOUS
  Filled 2018-04-26: qty 10

## 2018-04-26 MED ORDER — LORAZEPAM 2 MG/ML IJ SOLN
INTRAMUSCULAR | Status: AC
  Filled 2018-04-26: qty 1

## 2018-04-26 MED ORDER — ACETAMINOPHEN 650 MG RE SUPP
650.0000 mg | Freq: Four times a day (QID) | RECTAL | Status: DC | PRN
  Administered 2018-04-27 – 2018-05-08 (×2): 650 mg via RECTAL
  Filled 2018-04-26 (×2): qty 1

## 2018-04-26 MED ORDER — VALPROATE SODIUM 500 MG/5ML IV SOLN
2000.0000 mg | Freq: Once | INTRAVENOUS | Status: AC
  Administered 2018-04-26: 2000 mg via INTRAVENOUS
  Filled 2018-04-26: qty 20

## 2018-04-26 MED ORDER — IOPAMIDOL (ISOVUE-370) INJECTION 76%
100.0000 mL | Freq: Once | INTRAVENOUS | Status: AC | PRN
  Administered 2018-04-26: 100 mL via INTRAVENOUS

## 2018-04-26 MED ORDER — VALPROATE SODIUM 500 MG/5ML IV SOLN
500.0000 mg | Freq: Once | INTRAVENOUS | Status: AC
  Administered 2018-04-27: 500 mg via INTRAVENOUS
  Filled 2018-04-26: qty 5

## 2018-04-26 MED ORDER — LABETALOL HCL 5 MG/ML IV SOLN
5.0000 mg | INTRAVENOUS | Status: DC | PRN
  Filled 2018-04-26: qty 4

## 2018-04-26 MED ORDER — PREDNISOLONE ACETATE 1 % OP SUSP
1.0000 [drp] | Freq: Every day | OPHTHALMIC | Status: DC
  Administered 2018-04-27 – 2018-05-10 (×15): 1 [drp] via OPHTHALMIC
  Filled 2018-04-26: qty 5

## 2018-04-26 MED ORDER — ACETAMINOPHEN 325 MG PO TABS
650.0000 mg | ORAL_TABLET | Freq: Four times a day (QID) | ORAL | Status: DC | PRN
  Administered 2018-05-08 – 2018-05-10 (×3): 650 mg via ORAL
  Filled 2018-04-26 (×3): qty 2

## 2018-04-26 MED ORDER — SODIUM CHLORIDE 0.9 % IV BOLUS
500.0000 mL | Freq: Once | INTRAVENOUS | Status: AC
  Administered 2018-04-27: 500 mL via INTRAVENOUS

## 2018-04-26 MED ORDER — VALPROATE SODIUM 500 MG/5ML IV SOLN
500.0000 mg | Freq: Three times a day (TID) | INTRAVENOUS | Status: DC
  Administered 2018-04-27 – 2018-05-04 (×22): 500 mg via INTRAVENOUS
  Filled 2018-04-26 (×25): qty 5

## 2018-04-26 MED ORDER — IOPAMIDOL (ISOVUE-370) INJECTION 76%
INTRAVENOUS | Status: AC
  Filled 2018-04-26: qty 100

## 2018-04-26 MED ORDER — ENOXAPARIN SODIUM 40 MG/0.4ML ~~LOC~~ SOLN
40.0000 mg | Freq: Every day | SUBCUTANEOUS | Status: DC
  Administered 2018-04-27 – 2018-05-01 (×6): 40 mg via SUBCUTANEOUS
  Filled 2018-04-26 (×6): qty 0.4

## 2018-04-26 NOTE — Progress Notes (Signed)
Preliminary LTM EEG Note:  New LTM hooked up for 29F with AMS.  EEG shows rapid spikes in left occipital region without clinical signs aside from confusion. This pattern is concerning for a focal subclinical status epilepticus pattern.

## 2018-04-26 NOTE — ED Provider Notes (Signed)
[facility] EMERGENCY DEPARTMENT Provider Note   CSN: [id number] Arrival date & time: 04/26/18  1735     History   Chief Complaint Chief Complaint  Patient presents with  . Code Stroke    HPI [name] [name] is a 85 y.o. female. Level 5 caveat due to altered mental status. HPI Patient came in as a code stroke.  Last normal at 315.  Reportedly had taken a nap.  Had seizure at home and then another seizure for EMS.  Reportedly tonic-clonic for EMS.  Eyes deviated to left.  Chronically blind in left eye however.  Nonverbal for me and will not follow commands.  Met by Dr.Xu from neurology and myself at the bridge and taken emergently to CT scan.  Patient had nasal and oral airway in place.  Minimal gag reflex.  However reviewing notes from just a few days ago she is a DNR and would not want intubation.  Otherwise patient would have been intubated immediately upon arrival.  Had had Versed by EMS and in CT scan we ordered Vimpat. Past Medical History:  Diagnosis Date  . Arthritis    "thumbs, joints" (03/23/2017  . Atrial fibrillation ([place]) 04/21/2018  . Basal cell carcinoma    "several; scattered over my face, hands, leg some cut off; some burned off"  . Blind left eye    a. Corneal transplant x3 with rejection  . CAD (coronary artery disease)    a. s/p NSTEMI 2015 treated conservatively; nuclear stress test low risk. b. Continued angina despite med rx 07/2015 - s/p Bio Freedom Stent to ramus intermedius with mod LAD stenosis neg by FFR. // Nuc study 5/19:  EF 67, no ischemia or scar; Low Risk   . Complication of anesthesia    "very sensitive to RX"  . Diverticulitis large intestine   . Diverticulosis   . Family history of adverse reaction to anesthesia    "we all get PONV" (1/1/201)  . Gastritis   . GERD (gastroesophageal reflux disease)   . Hayfever   . Helicobacter pylori (H. pylori) 05/22/02   RUT-Positive  . Hypertension   . Myocardial infarction ([place]) 2015  .  Squamous carcinoma    "several; scattered over my face, hands, leg some cut off; some burned off"  . Stroke [facility]) 2015   denies residual on 08/07/2015  . Stroke [facility]) 03/18/2017   "has effected her vision, balance, some memory" (03/23/2017)  . SVT (supraventricular tachycardia) (HCC)    a. seen by Dr. Caryl Comes - has loop recorder in. Patient has declined amiodarone due to side effect profile  . Varicose veins     Patient Active Problem List   Diagnosis Date Noted  . Atrial fibrillation with RVR ([place]) 04/21/2018  . Vascular dementia with behavior disturbance ([place])   . Epigastric pain 11/18/2017  . Belching 11/18/2017  . Diastolic dysfunction   . Hypokalemia   . Seizures ([place])   . PAF (paroxysmal atrial fibrillation) ([place])   . Cerebral embolism with cerebral infarction 09/21/2017  . Acute CVA (cerebrovascular accident) ([place]) 09/21/2017  . Blind 04/02/2017  . Visual field cut- right 04/02/2017  . Gastroesophageal reflux disease   . Pre-diabetes   . Blind left eye- (Fuch's disease) 03/05/2015  . Essential hypertension 12/19/2014  . CAD (coronary artery disease) 12/19/2014  . History of Rt brain stroke Oct 2015 06/14/2014  . Headache 06/14/2014  . Malignant hypertension 01/20/2014  . Aphasia 01/04/2014  . Paresthesia 01/04/2014  . Generalized abdominal pain  01/01/2014  . History of diverticulitis 01/01/2014  . Diaphoresis 12/29/2012  . Sleep disturbance 10/03/2012  . Depression 07/12/2012  . OA (osteoarthritis) of knee 06/27/2012  . History of PSVT 12/15/2011    Past Surgical History:  Procedure Laterality Date  . BASAL CELL CARCINOMA EXCISION     "several; scattered over my face, hands, leg some cut off; some burned off"  . CARDIAC CATHETERIZATION N/A 08/07/2015   Procedure: Left Heart Cath and Coronary Angiography;  Surgeon: [name], MD;  Location: [facility] CV LAB;  Service: Cardiovascular;  Laterality: N/A;  . CARDIAC CATHETERIZATION N/A 08/07/2015   Procedure:  Coronary Stent Intervention;  Surgeon: [name], MD;  Location: [facility] CV LAB;  Service: Cardiovascular;  Laterality: N/A;  . CARDIAC CATHETERIZATION N/A 08/07/2015   Procedure: Intravascular Pressure Wire/FFR Study;  Surgeon: [name], MD;  Location: [place] CV LAB;  Service: Cardiovascular;  Laterality: N/A;  . CATARACT EXTRACTION W/ INTRAOCULAR LENS  IMPLANT, BILATERAL Bilateral 2005  . CLOSED REDUCTION SHOULDER DISLOCATION Left 2016 X 2  . CORNEAL TRANSPLANT Bilateral right 2009, left 2009    3 in left eye (last 2 failed), 1 in right eye  . DILATION AND CURETTAGE OF UTERUS    . EYE SURGERY    . JOINT REPLACEMENT    . [place]   "had gangrene in it"  . LOOP RECORDER IMPLANT N/A 01/05/2014   Procedure: LOOP RECORDER IMPLANT;  Surgeon: Coralyn Mark, MD;  Location: [place] CATH LAB;  Service: Cardiovascular;  Laterality: N/A;  . SQUAMOUS CELL CARCINOMA EXCISION     "several; scattered over my face, hands, leg some cut off; some burned off"  . TONSILLECTOMY    . TOTAL ABDOMINAL HYSTERECTOMY  11/1983   "ovaries and all"  . TOTAL KNEE ARTHROPLASTY Left 06/27/2012   Procedure: LEFT TOTAL KNEE ARTHROPLASTY;  Surgeon: Gearlean Alf, MD;  Location: [facility];  Service: Orthopedics;  Laterality: Left;  . TOTAL KNEE ARTHROPLASTY Right 12/12/2012   Procedure: RIGHT TOTAL KNEE ARTHROPLASTY;  Surgeon: Gearlean Alf, MD;  Location: [facility];  Service: Orthopedics;  Laterality: Right;  . TUBAL LIGATION  1966     OB History   No obstetric history on file.      Home Medications    Prior to Admission medications   Medication Sig Start Date End Date Taking? Authorizing Provider  cefixime (SUPRAX) 400 MG CAPS capsule Take 1 capsule (400 mg total) by mouth daily. 04/23/18   Elgergawy, Silver Huguenin, MD  clonazePAM (KLONOPIN) 0.5 MG tablet Take 0.25-5 mg by mouth See admin instructions. Take 1/2 tablet (0.40m) in the morning, and 1 tablet (.554m at night May take  additional 1/2 tablet (.2557min the afternoon if needed 03/10/18   [provider]  clopidogrel (PLAVIX) 75 MG tablet Take 1 tablet (75 mg total) by mouth daily. Patient taking differently: Take 75 mg by mouth every morning.  12/30/17   CooSherren MochaD  diltiazem (CARDIZEM CD) 180 MG 24 hr capsule Take 1 capsule (180 mg total) by mouth daily. 04/22/18   Elgergawy, DawSilver HugueninD  donepezil (ARICEPT) 5 MG tablet Take 7.5 mg by mouth every morning.  01/13/18   [provider]  nitroGLYCERIN (NITROSTAT) 0.4 MG SL tablet Place 1 tablet (0.4 mg total) under the tongue every 5 (five) minutes as needed for chest pain. 08/08/15   SimLyda Jester PA-C  prednisoLONE acetate (PRED FORTE) 1 % ophthalmic suspension Place 1 drop into  both eyes See admin instructions. 1 drop into both eyes at bedtime spaced 5 minutes apart from Systane gel    [provider]  risperiDONE (RISPERDAL) 2 MG tablet Take 2 mg by mouth at bedtime.  01/13/18   [provider]  sertraline (ZOLOFT) 100 MG tablet Take 150 mg by mouth every morning.  10/28/17   [provider]  traZODone (DESYREL) 50 MG tablet Take 25 mg by mouth at bedtime. 03/10/18   [provider]    Family History Family History  Problem Relation Age of Onset  . Stroke Mother   . Heart attack Father   . Heart attack Brother   . Cancer Brother   . Heart attack Brother   . Heart attack Brother   . Heart attack Brother   . Parkinsonism Brother     Social History Social History   Tobacco Use  . Smoking status: Never Smoker  . Smokeless tobacco: Never Used  Substance Use Topics  . Alcohol use: No  . Drug use: No     Allergies   Fluorescein; Hydralazine hcl; Sulfa antibiotics; Ultram [tramadol]; Yellow dye; Buprenex [buprenorphine hcl]; Keflex [cephalexin]; Lipitor [atorvastatin]; Restasis [cyclosporine]; Augmentin [amoxicillin-pot clavulanate]; Levofloxacin; Morphine and related; Percocet  [oxycodone-acetaminophen]; Tape; and Zetia [ezetimibe]   Review of Systems Review of Systems  Unable to perform ROS: Patient unresponsive     Physical Exam Updated Vital Signs BP (!) 209/91   Pulse 98   Resp (!) 33   Wt 78.2 kg   SpO2 95%   BMI 31.53 kg/m   Physical Exam Constitutional:      Appearance: She is not diaphoretic.  HENT:     Head: Atraumatic.     Comments: Bilateral nasal trumpets in.  Oral airway also in place.  Patient not gagging against airway. Eyes:     Comments: Right eye deviated medially.  Chronically blind left eye.  Cardiovascular:     Rate and Rhythm: Regular rhythm.  Pulmonary:     Comments: Mildly harsh breath sounds throughout. Abdominal:     Tenderness: There is no abdominal tenderness.  Musculoskeletal:        General: No deformity.  Skin:    General: Skin is warm.  Neurological:     Comments: Nonverbal.  No gag reflex intact.  Right eye deviated medially.  No response to pain.  Nonverbal.  Breathing spontaneously.      ED Treatments / Results  Labs (all labs ordered are listed, but only abnormal results are displayed) Labs Reviewed  COMPREHENSIVE METABOLIC PANEL - Abnormal; Notable for the following components:      Result Value   CO2 18 (*)    Glucose, Bld 161 (*)    Total Protein 6.3 (*)    AST 47 (*)    Total Bilirubin 1.5 (*)    Anion gap 18 (*)    All other components within normal limits  LACTIC ACID, PLASMA - Abnormal; Notable for the following components:   Lactic Acid, Venous 3.5 (*)    All other components within normal limits  CBG MONITORING, ED - Abnormal; Notable for the following components:   Glucose-Capillary 137 (*)    All other components within normal limits  ETHANOL  PROTIME-INR  APTT  CBC  DIFFERENTIAL  RAPID URINE DRUG SCREEN, HOSP PERFORMED  URINALYSIS, ROUTINE W REFLEX MICROSCOPIC  LACTIC ACID, PLASMA  I-STAT TROPONIN, ED  I-STAT CREATININE, ED    EKG None  Radiology Ct Angio Head W Or  Wo Contrast  Result Date: 04/26/2018 CLINICAL DATA:  85 y/o F; altered mental status, left-sided gaze, bilateral weakness. EXAM: CT ANGIOGRAPHY HEAD AND NECK CT PERFUSION BRAIN TECHNIQUE: Multidetector CT imaging of the head and neck was performed using the standard protocol during bolus administration of intravenous contrast. Multiplanar CT image reconstructions and MIPs were obtained to evaluate the vascular anatomy. Carotid stenosis measurements (when applicable) are obtained utilizing NASCET criteria, using the distal internal carotid diameter as the denominator. Multiphase CT imaging of the brain was performed following IV bolus contrast injection. Subsequent parametric perfusion maps were calculated using RAPID software. CONTRAST:  152m ISOVUE-370 IOPAMIDOL (ISOVUE-370) INJECTION 76% COMPARISON:  04/21/2018 in 01/15/2018 CT head. 09/21/2017 CT angiogram neck, CT angiogram head, CT perfusion head. 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 Small chronic infarctions in the cerebellum, right hemi pons, bilateral thalami. Small chronic cortical infarction in left posterior frontal lobe. Nonspecific white matter hypodensities compatible with chronic microvascular ischemic changes and volume loss of the brain are stable. No acute stroke, hemorrhage, mass effect, extra-axial collection, hydrocephalus, or herniation identified. Vascular: As below. Sinuses/Orbits: No acute finding. Other: Bilateral intra-ocular lens replacement. ASPECTS ([place] Early CT Score) - Ganglionic level infarction (caudate, lentiform nuclei, internal capsule, insula, M1-M3 cortex): 7 - Supraganglionic infarction (M4-M6 cortex): 3 Total score (0-10 with 10 being normal): 10 Review of the MIP images confirms the above findings CTA NECK FINDINGS Aortic arch: Standard branching. Imaged portion shows no evidence of aneurysm or dissection. No significant stenosis of the  major arch vessel origins. Mild calcific atherosclerosis. Right carotid system: No evidence of dissection, stenosis (50% or greater) or occlusion. Non stenotic calcified plaque of the carotid bifurcation. Left carotid system: No evidence of dissection, stenosis (50% or greater) or occlusion. Non stenotic calcified plaque of the carotid bifurcation. Vertebral arteries: Left dominant. No evidence of dissection, stenosis (50% or greater) or occlusion. Skeleton: Negative. Other neck: Negative. Upper chest: Negative. Review of the MIP images confirms the above findings CTA HEAD FINDINGS Anterior circulation: No high-grade proximal stenosis, proximal occlusion, aneurysm, or vascular malformation. The left MCA inferior division M3 branch is reconstituted, but with persistent proximal stenosis. Posterior circulation: No significant stenosis, proximal occlusion, aneurysm, or vascular malformation. There multiple segments of mild stenosis in the anterior and posterior intracranial circulation compatible with atherosclerosis, similar to prior CTA of the head. Venous sinuses: As permitted by contrast timing, patent. Anatomic variants: None significant. Delayed phase: No abnormal intracranial enhancement. Review of the MIP images confirms the above findings CT Brain Perfusion Findings: CBF (<30%) Volume: 063mPerfusion (Tmax>6.0s) volume: 37m12mthis is localized to the falx in likely misregistration artifact. Mismatch Volume: 0mL36mfarction Location:Negative. Small focus of mildly decreased cerebral blood flow and increased cerebral blood volume within the left parietal lobe. IMPRESSION: CT head: 1. No acute intracranial abnormality identified. 2. ASPECTS is 10. 3. Multiple stable chronic infarctions, microvascular ischemic changes, and volume loss of the brain. CTA neck: 1. Patent carotid and vertebral arteries. No dissection, aneurysm, or hemodynamically significant stenosis by NASCET criteria. 2. Mild calcific atherosclerosis  of the carotid bifurcations in the aorta. CTA head: 1. No new large vessel occlusion, high-grade stenosis, aneurysm, or vascular malformation. 2. Reconstitution of left MCA inferior division M3 branch, persistent stenosis of the branch origin. 3. Intracranial atherosclerosis with multiple segments of mild stenosis in the anterior and posterior circulation. CT perfusion head: 1. No acute infarction or ischemic penumbra by standard CT perfusion criteria. 2.  Small focus of mildly decreased cerebral blood flow and increased cerebral blood volume in the left parietal lobe compatible with oligemia without ischemia. These results were called by telephone at the time of interpretation on 04/26/2018 at 6:30 pm to Dr. Cheral Marker, who verbally acknowledged these results. Electronically Signed   By: Kristine Garbe M.D.   On: 04/26/2018 18:33   Ct Angio Neck W Or Wo Contrast  Result Date: 04/26/2018 CLINICAL DATA:  85 y/o F; altered mental status, left-sided gaze, bilateral weakness. EXAM: CT ANGIOGRAPHY HEAD AND NECK CT PERFUSION BRAIN TECHNIQUE: Multidetector CT imaging of the head and neck was performed using the standard protocol during bolus administration of intravenous contrast. Multiplanar CT image reconstructions and MIPs were obtained to evaluate the vascular anatomy. Carotid stenosis measurements (when applicable) are obtained utilizing NASCET criteria, using the distal internal carotid diameter as the denominator. Multiphase CT imaging of the brain was performed following IV bolus contrast injection. Subsequent parametric perfusion maps were calculated using RAPID software. CONTRAST:  162m ISOVUE-370 IOPAMIDOL (ISOVUE-370) INJECTION 76% COMPARISON:  04/21/2018 in 01/15/2018 CT head. 09/21/2017 CT angiogram neck, CT angiogram head, CT perfusion head. 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 Small chronic infarctions in the cerebellum, right hemi  pons, bilateral thalami. Small chronic cortical infarction in left posterior frontal lobe. Nonspecific white matter hypodensities compatible with chronic microvascular ischemic changes and volume loss of the brain are stable. No acute stroke, hemorrhage, mass effect, extra-axial collection, hydrocephalus, or herniation identified. Vascular: As below. Sinuses/Orbits: No acute finding. Other: Bilateral intra-ocular lens replacement. ASPECTS ([place]roke [place] Early CT Score) - Ganglionic level infarction (caudate, lentiform nuclei, internal capsule, insula, M1-M3 cortex): 7 - Supraganglionic infarction (M4-M6 cortex): 3 Total score (0-10 with 10 being normal): 10 Review of the MIP images confirms the above findings CTA NECK FINDINGS Aortic arch: Standard branching. Imaged portion shows no evidence of aneurysm or dissection. No significant stenosis of the major arch vessel origins. Mild calcific atherosclerosis. Right carotid system: No evidence of dissection, stenosis (50% or greater) or occlusion. Non stenotic calcified plaque of the carotid bifurcation. Left carotid system: No evidence of dissection, stenosis (50% or greater) or occlusion. Non stenotic calcified plaque of the carotid bifurcation. Vertebral arteries: Left dominant. No evidence of dissection, stenosis (50% or greater) or occlusion. Skeleton: Negative. Other neck: Negative. Upper chest: Negative. Review of the MIP images confirms the above findings CTA HEAD FINDINGS Anterior circulation: No high-grade proximal stenosis, proximal occlusion, aneurysm, or vascular malformation. The left MCA inferior division M3 branch is reconstituted, but with persistent proximal stenosis. Posterior circulation: No significant stenosis, proximal occlusion, aneurysm, or vascular malformation. There multiple segments of mild stenosis in the anterior and posterior intracranial circulation compatible with atherosclerosis, similar to prior CTA of the head. Venous  sinuses: As permitted by contrast timing, patent. Anatomic variants: None significant. Delayed phase: No abnormal intracranial enhancement. Review of the MIP images confirms the above findings CT Brain Perfusion Findings: CBF (<30%) Volume: 0478mPerfusion (Tmax>6.0s) volume: 78m17mthis is localized to the falx in likely misregistration artifact. Mismatch Volume: 0mL38mfarction Location:Negative. Small focus of mildly decreased cerebral blood flow and increased cerebral blood volume within the left parietal lobe. IMPRESSION: CT head: 1. No acute intracranial abnormality identified. 2. ASPECTS is 10. 3. Multiple stable chronic infarctions, microvascular ischemic changes, and volume loss of the brain. CTA neck: 1. Patent carotid and vertebral arteries. No dissection, aneurysm, or hemodynamically significant stenosis by NASCET criteria. 2. Mild calcific  atherosclerosis of the carotid bifurcations in the aorta. CTA head: 1. No new large vessel occlusion, high-grade stenosis, aneurysm, or vascular malformation. 2. Reconstitution of left MCA inferior division M3 branch, persistent stenosis of the branch origin. 3. Intracranial atherosclerosis with multiple segments of mild stenosis in the anterior and posterior circulation. CT perfusion head: 1. No acute infarction or ischemic penumbra by standard CT perfusion criteria. 2. Small focus of mildly decreased cerebral blood flow and increased cerebral blood volume in the left parietal lobe compatible with oligemia without ischemia. These results were called by telephone at the time of interpretation on 04/26/2018 at 6:30 pm to Dr. Cheral Marker, who verbally acknowledged these results. Electronically Signed   By: Kristine Garbe M.D.   On: 04/26/2018 18:33   Ct Cerebral Perfusion W Contrast  Result Date: 04/26/2018 CLINICAL DATA:  85 y/o F; altered mental status, left-sided gaze, bilateral weakness. EXAM: CT ANGIOGRAPHY HEAD AND NECK CT PERFUSION BRAIN TECHNIQUE:  Multidetector CT imaging of the head and neck was performed using the standard protocol during bolus administration of intravenous contrast. Multiplanar CT image reconstructions and MIPs were obtained to evaluate the vascular anatomy. Carotid stenosis measurements (when applicable) are obtained utilizing NASCET criteria, using the distal internal carotid diameter as the denominator. Multiphase CT imaging of the brain was performed following IV bolus contrast injection. Subsequent parametric perfusion maps were calculated using RAPID software. CONTRAST:  149m ISOVUE-370 IOPAMIDOL (ISOVUE-370) INJECTION 76% COMPARISON:  04/21/2018 in 01/15/2018 CT head. 09/21/2017 CT angiogram neck, CT angiogram head, CT perfusion head. 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 Small chronic infarctions in the cerebellum, right hemi pons, bilateral thalami. Small chronic cortical infarction in left posterior frontal lobe. Nonspecific white matter hypodensities compatible with chronic microvascular ischemic changes and volume loss of the brain are stable. No acute stroke, hemorrhage, mass effect, extra-axial collection, hydrocephalus, or herniation identified. Vascular: As below. Sinuses/Orbits: No acute finding. Other: Bilateral intra-ocular lens replacement. ASPECTS ([place]roke [place] Early CT Score) - Ganglionic level infarction (caudate, lentiform nuclei, internal capsule, insula, M1-M3 cortex): 7 - Supraganglionic infarction (M4-M6 cortex): 3 Total score (0-10 with 10 being normal): 10 Review of the MIP images confirms the above findings CTA NECK FINDINGS Aortic arch: Standard branching. Imaged portion shows no evidence of aneurysm or dissection. No significant stenosis of the major arch vessel origins. Mild calcific atherosclerosis. Right carotid system: No evidence of dissection, stenosis (50% or greater) or occlusion. Non stenotic calcified plaque of the carotid  bifurcation. Left carotid system: No evidence of dissection, stenosis (50% or greater) or occlusion. Non stenotic calcified plaque of the carotid bifurcation. Vertebral arteries: Left dominant. No evidence of dissection, stenosis (50% or greater) or occlusion. Skeleton: Negative. Other neck: Negative. Upper chest: Negative. Review of the MIP images confirms the above findings CTA HEAD FINDINGS Anterior circulation: No high-grade proximal stenosis, proximal occlusion, aneurysm, or vascular malformation. The left MCA inferior division M3 branch is reconstituted, but with persistent proximal stenosis. Posterior circulation: No significant stenosis, proximal occlusion, aneurysm, or vascular malformation. There multiple segments of mild stenosis in the anterior and posterior intracranial circulation compatible with atherosclerosis, similar to prior CTA of the head. Venous sinuses: As permitted by contrast timing, patent. Anatomic variants: None significant. Delayed phase: No abnormal intracranial enhancement. Review of the MIP images confirms the above findings CT Brain Perfusion Findings: CBF (<30%) Volume: 073mPerfusion (Tmax>6.0s) volume: 17m73mthis is localized to the falx in likely misregistration artifact. Mismatch Volume: 0mL89mfarction Location:Negative.  Small focus of mildly decreased cerebral blood flow and increased cerebral blood volume within the left parietal lobe. IMPRESSION: CT head: 1. No acute intracranial abnormality identified. 2. ASPECTS is 10. 3. Multiple stable chronic infarctions, microvascular ischemic changes, and volume loss of the brain. CTA neck: 1. Patent carotid and vertebral arteries. No dissection, aneurysm, or hemodynamically significant stenosis by NASCET criteria. 2. Mild calcific atherosclerosis of the carotid bifurcations in the aorta. CTA head: 1. No new large vessel occlusion, high-grade stenosis, aneurysm, or vascular malformation. 2. Reconstitution of left MCA inferior division M3  branch, persistent stenosis of the branch origin. 3. Intracranial atherosclerosis with multiple segments of mild stenosis in the anterior and posterior circulation. CT perfusion head: 1. No acute infarction or ischemic penumbra by standard CT perfusion criteria. 2. Small focus of mildly decreased cerebral blood flow and increased cerebral blood volume in the left parietal lobe compatible with oligemia without ischemia. These results were called by telephone at the time of interpretation on 04/26/2018 at 6:30 pm to Dr. Cheral Marker, who verbally acknowledged these results. Electronically Signed   By: Kristine Garbe M.D.   On: 04/26/2018 18:33   Dg Chest Portable 1 View  Result Date: 04/26/2018 CLINICAL DATA:  85 year old female with altered mental status. EXAM: PORTABLE CHEST 1 VIEW COMPARISON:  Portable chest 04/21/2018 and earlier. FINDINGS: Portable AP semi upright view at 1930 hours. The patient is rotated to the left. Cardiomegaly and mediastinal contours are stable. Stable left chest cardiac event recorder. Allowing for portable technique the lungs are clear. No pneumothorax. Paucity of bowel gas in the upper abdomen. Stable cholecystectomy clips. IMPRESSION: No acute cardiopulmonary abnormality. Electronically Signed   By: Genevie Ann M.D.   On: 04/26/2018 19:51   Ct Head Code Stroke Wo Contrast  Result Date: 04/26/2018 CLINICAL DATA:  85 y/o F; altered mental status, left-sided gaze, bilateral weakness. EXAM: CT ANGIOGRAPHY HEAD AND NECK CT PERFUSION BRAIN TECHNIQUE: Multidetector CT imaging of the head and neck was performed using the standard protocol during bolus administration of intravenous contrast. Multiplanar CT image reconstructions and MIPs were obtained to evaluate the vascular anatomy. Carotid stenosis measurements (when applicable) are obtained utilizing NASCET criteria, using the distal internal carotid diameter as the denominator. Multiphase CT imaging of the brain was performed following  IV bolus contrast injection. Subsequent parametric perfusion maps were calculated using RAPID software. CONTRAST:  150m ISOVUE-370 IOPAMIDOL (ISOVUE-370) INJECTION 76% COMPARISON:  04/21/2018 in 01/15/2018 CT head. 09/21/2017 CT angiogram neck, CT angiogram head, CT perfusion head. 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 Small chronic infarctions in the cerebellum, right hemi pons, bilateral thalami. Small chronic cortical infarction in left posterior frontal lobe. Nonspecific white matter hypodensities compatible with chronic microvascular ischemic changes and volume loss of the brain are stable. No acute stroke, hemorrhage, mass effect, extra-axial collection, hydrocephalus, or herniation identified. Vascular: As below. Sinuses/Orbits: No acute finding. Other: Bilateral intra-ocular lens replacement. ASPECTS (ABlumStroke Program Early CT Score) - Ganglionic level infarction (caudate, lentiform nuclei, internal capsule, insula, M1-M3 cortex): 7 - Supraganglionic infarction (M4-M6 cortex): 3 Total score (0-10 with 10 being normal): 10 Review of the MIP images confirms the above findings CTA NECK FINDINGS Aortic arch: Standard branching. Imaged portion shows no evidence of aneurysm or dissection. No significant stenosis of the major arch vessel origins. Mild calcific atherosclerosis. Right carotid system: No evidence of dissection, stenosis (50% or greater) or occlusion. Non stenotic calcified plaque of the carotid bifurcation. Left carotid  system: No evidence of dissection, stenosis (50% or greater) or occlusion. Non stenotic calcified plaque of the carotid bifurcation. Vertebral arteries: Left dominant. No evidence of dissection, stenosis (50% or greater) or occlusion. Skeleton: Negative. Other neck: Negative. Upper chest: Negative. Review of the MIP images confirms the above findings CTA HEAD FINDINGS Anterior circulation: No high-grade proximal  stenosis, proximal occlusion, aneurysm, or vascular malformation. The left MCA inferior division M3 branch is reconstituted, but with persistent proximal stenosis. Posterior circulation: No significant stenosis, proximal occlusion, aneurysm, or vascular malformation. There multiple segments of mild stenosis in the anterior and posterior intracranial circulation compatible with atherosclerosis, similar to prior CTA of the head. Venous sinuses: As permitted by contrast timing, patent. Anatomic variants: None significant. Delayed phase: No abnormal intracranial enhancement. Review of the MIP images confirms the above findings CT Brain Perfusion Findings: CBF (<30%) Volume: 69m Perfusion (Tmax>6.0s) volume: 421m this is localized to the falx in likely misregistration artifact. Mismatch Volume: 74m25mnfarction Location:Negative. Small focus of mildly decreased cerebral blood flow and increased cerebral blood volume within the left parietal lobe. IMPRESSION: CT head: 1. No acute intracranial abnormality identified. 2. ASPECTS is 10. 3. Multiple stable chronic infarctions, microvascular ischemic changes, and volume loss of the brain. CTA neck: 1. Patent carotid and vertebral arteries. No dissection, aneurysm, or hemodynamically significant stenosis by NASCET criteria. 2. Mild calcific atherosclerosis of the carotid bifurcations in the aorta. CTA head: 1. No new large vessel occlusion, high-grade stenosis, aneurysm, or vascular malformation. 2. Reconstitution of left MCA inferior division M3 branch, persistent stenosis of the branch origin. 3. Intracranial atherosclerosis with multiple segments of mild stenosis in the anterior and posterior circulation. CT perfusion head: 1. No acute infarction or ischemic penumbra by standard CT perfusion criteria. 2. Small focus of mildly decreased cerebral blood flow and increased cerebral blood volume in the left parietal lobe compatible with oligemia without ischemia. These results were  called by telephone at the time of interpretation on 04/26/2018 at 6:30 pm to Dr. LinCheral Markerho verbally acknowledged these results. Electronically Signed   By: LanKristine GarbeD.   On: 04/26/2018 18:33    Procedures Procedures (including critical care time)  Medications Ordered in ED Medications  iopamidol (ISOVUE-370) 76 % injection (has no administration in time range)  lacosamide (VIMPAT) 100 mg in sodium chloride 0.9 % 25 mL IVPB (has no administration in time range)  LORazepam (ATIVAN) injection 1 mg (has no administration in time range)  valproate (DEPACON) 2,000 mg in dextrose 5 % 50 mL IVPB (2,000 mg Intravenous New Bag/Given 04/26/18 2009)  valproate (DEPACON) 500 mg in dextrose 5 % 50 mL IVPB (has no administration in time range)  LORazepam (ATIVAN) 2 MG/ML injection (has no administration in time range)  lacosamide (VIMPAT) 200 mg in sodium chloride 0.9 % 25 mL IVPB (0 mg Intravenous Stopped 04/26/18 1912)  iopamidol (ISOVUE-370) 76 % injection 100 mL (100 mLs Intravenous Contrast Given 04/26/18 1750)     Initial Impression / Assessment and Plan / ED Course  I have reviewed the triage vital signs and the nursing notes.  Pertinent labs & imaging results that were available during my care of the patient were reviewed by me and considered in my medical decision making (see chart for details).     Patient came in as a code stroke.  Met by neurology and myself.  Initially thought seizure versus stroke.  Vimpat emergently ordered.  CT scans reassuring overall.  Taken to MRI but had another CT  over there.  Dr. Erlinda Hong had had more discussions about CODE STATUS and patient remains DNR DNI.  After Vimpat did have some improvement in mental status and would look around some but still nonverbal and would not follow commands.  Later had another tonic-clonic seizure.  Dr. Leonel Ramsay had taken over care of the patient.  Loaded on Depakote.  Patient will be monitored overnight with  antiepileptics.  If no response tomorrow potentially could go to comfort care but will go to stepdown tonight for further treatment.   CRITICAL CARE Performed by: Davonna Belling Total critical care time: 30 minutes Critical care time was exclusive of separately billable procedures and treating other patients. Critical care was necessary to treat or prevent imminent or life-threatening deterioration. Critical care was time spent personally by me on the following activities: development of treatment plan with patient and/or surrogate as well as nursing, discussions with consultants, evaluation of patient's response to treatment, examination of patient, obtaining history from patient or surrogate, ordering and performing treatments and interventions, ordering and review of laboratory studies, ordering and review of radiographic studies, pulse oximetry and re-evaluation of patient's condition.   Final Clinical Impressions(s) / ED Diagnoses   Final diagnoses:  Partial symptomatic epilepsy with complex partial seizures, intractable, with status epilepticus [facility])    ED Discharge Orders    None       Davonna Belling, MD 04/26/18 2021

## 2018-04-26 NOTE — ED Notes (Signed)
Pt had seizure witnessed by family for approximately 30-45 sec. Pt unresponsive at this time, no seizure activity noted. MD Leonel Ramsay made aware

## 2018-04-26 NOTE — Progress Notes (Signed)
LTM EEG is setup - no initial skin breakdown. Push button tested.  RN educated on how to transfer pt if she gets a room.

## 2018-04-26 NOTE — ED Triage Notes (Addendum)
Pt arrives via [facility] as a code stroke with LSN [id number]. Pt had a grand mal seizure at home, no hx of. Pt was 85% on RA on ems arrival with a left sided tremor and left gaze. Pt had 1 minute seizure with EMS. EMS gave 2.5 of versed.

## 2018-04-26 NOTE — Consult Note (Signed)
Neurology Consultation Note  Consult Requested by: Dr. Alvino Chapel  Reason for Consult: seizure  Consult Date: 04/26/18  The history was obtained from the daughter and EMS and chart.  During history and examination, all items were able to obtain unless otherwise noted.  History of Present Illness:  Clary Boulais is a 85 y.o. Caucasian female with PMH of stroke, afib not on AC, GIB, seizure, CHF, left eye blind, right eye cortical blindness, CAD presented with seizure and left gaze.   Pt lives with her daughter, [place], and was at her baseline, she was having nap around 1515 when she was found to have seizure during the sleep. Daughter stated that she had all four extremities tensed up followed by shaking jerking whole body. EMS called. On arrival, pt not seizing anymore but had left gaze. Not moving extremities bilaterally. Code stroke called. En route, pt had another seizure with all extremities pointing to air, tonic followed by clonic jerking. Lasted about 2 min. She received 2.5mg  versed. On ER arrival, pt gurgling, not able to protect her airway. However, she is DNR/DRI. No intubation performed. She had CT showed no bleeding or acute change. CTA head and neck no LVO. CTP essentially negative. While attempting with stat limited MRI, pt had her 3 rd seizure lasting 1.47min. no ativan given due to fear of breathing compromise. Stat vimpat 200mg  IV given. Pt clinically no more seizure and eyes opening more than before and eyes in the middle position with rolling to right intermittently. LTM EEG is being hooked up.   Patient had multiple stroke/TIA in the past including 12/2013 with small left frontal cortical infarct put on loop recorder, 01/2015 loop recorder showed 10 seconds A. fib.  02/2017 right midbrain infarct, old left frontal and bilateral parietal infarct.  09/2017 patient admitted for Left temporal infarct, CT also showed old right PCA infarct.  Patient had chronic left eye blindness, however also  developed right eye cortical blindness in 09/2017 due to bilateral hemianopia.  She was put on Eliquis 5 to 7 days post stroke.  However, she developed GI bleeding after discharge, after risk-benefit discussion, Eliquis was discontinued and she was put back on Plavix.  She also developed seizure on 09/22/2017, not able to tolerating Keppra due to lethargy, started on Vimpat.  However, on this presentation, patient was not on Vimpat as home meds.  Patient was just admitted on 04/21/2018 for A. fib RVR and UTI, had Cardizem IV and antibiotic treatment.  No anticoagulation after risk/benefit discussion.  She was discharged second day.  As per daughter, patient baseline was able to speak and respond to questions, need help for daily activity and ambulation, also with cognitive impairment.  Has 24/7 caregiver at home.  LSN: [id number] tPA Given: No: seizure, likely not stroke  Past Medical History:  Diagnosis Date  . Arthritis    "thumbs, joints" (03/23/2017  . Atrial fibrillation ([place]) 04/21/2018  . Basal cell carcinoma    "several; scattered over my face, hands, leg some cut off; some burned off"  . Blind left eye    a. Corneal transplant x3 with rejection  . CAD (coronary artery disease)    a. s/p NSTEMI 2015 treated conservatively; nuclear stress test low risk. b. Continued angina despite med rx 07/2015 - s/p Bio Freedom Stent to ramus intermedius with mod LAD stenosis neg by FFR. // Nuc study 5/19:  EF 67, no ischemia or scar; Low Risk   . Complication of anesthesia    "very sensitive  to RX"  . Diverticulitis large intestine   . Diverticulosis   . Family history of adverse reaction to anesthesia    "we all get PONV" (1/1/201)  . Gastritis   . GERD (gastroesophageal reflux disease)   . Hayfever   . Helicobacter pylori (H. pylori) 05/22/02   RUT-Positive  . Hypertension   . Myocardial infarction ([place]) 2015  . Squamous carcinoma    "several; scattered over my face, hands, leg some cut off; some  burned off"  . Stroke [facility]) 2015   denies residual on 08/07/2015  . Stroke [facility]) 03/18/2017   "has effected her vision, balance, some memory" (03/23/2017)  . SVT (supraventricular tachycardia) ([facility])    a. seen by Dr. Caryl Comes - has loop recorder in. Patient has declined amiodarone due to side effect profile  . Varicose veins     Past Surgical History:  Procedure Laterality Date  . BASAL CELL CARCINOMA EXCISION     "several; scattered over my face, hands, leg some cut off; some burned off"  . CARDIAC CATHETERIZATION N/A 08/07/2015   Procedure: Left Heart Cath and Coronary Angiography;  Surgeon: [name], MD;  Location: [place] CV [facility];  Service: Cardiovascular;  Laterality: N/A;  . CARDIAC CATHETERIZATION N/A 08/07/2015   Procedure: Coronary Stent Intervention;  Surgeon: [name], MD;  Location: [place] CV [facility];  Service: Cardiovascular;  Laterality: N/A;  . CARDIAC CATHETERIZATION N/A 08/07/2015   Procedure: Intravascular Pressure Wire/FFR Study;  Surgeon: [name], MD;  Location: [place] CV [facility];  Service: Cardiovascular;  Laterality: N/A;  . CATARACT EXTRACTION W/ INTRAOCULAR LENS  IMPLANT, BILATERAL Bilateral 2005  . CLOSED REDUCTION SHOULDER DISLOCATION Left 2016 X 2  . CORNEAL TRANSPLANT Bilateral right 2009, left 2009    3 in left eye (last 2 failed), 1 in right eye  . DILATION AND CURETTAGE OF UTERUS    . EYE SURGERY    . JOINT REPLACEMENT    . [place]   "had gangrene in it"  . LOOP RECORDER IMPLANT N/A 01/05/2014   Procedure: LOOP RECORDER IMPLANT;  Surgeon: Coralyn Mark, MD;  Location: [place] CATH LAB;  Service: Cardiovascular;  Laterality: N/A;  . SQUAMOUS CELL CARCINOMA EXCISION     "several; scattered over my face, hands, leg some cut off; some burned off"  . TONSILLECTOMY    . TOTAL ABDOMINAL HYSTERECTOMY  11/1983   "ovaries and all"  . TOTAL KNEE ARTHROPLASTY Left 06/27/2012   Procedure: LEFT TOTAL KNEE ARTHROPLASTY;  Surgeon:  Gearlean Alf, MD;  Location: [facility];  Service: Orthopedics;  Laterality: Left;  . TOTAL KNEE ARTHROPLASTY Right 12/12/2012   Procedure: RIGHT TOTAL KNEE ARTHROPLASTY;  Surgeon: Gearlean Alf, MD;  Location: [facility];  Service: Orthopedics;  Laterality: Right;  . TUBAL LIGATION  1966    Family History  Problem Relation Age of Onset  . Stroke Mother   . Heart attack Father   . Heart attack Brother   . Cancer Brother   . Heart attack Brother   . Heart attack Brother   . Heart attack Brother   . Parkinsonism Brother     Social History:  reports that she has never smoked. She has never used smokeless tobacco. She reports that she does not drink alcohol or use drugs.  Allergies:  Allergies  Allergen Reactions  . Fluorescein Anaphylaxis, Shortness Of Breath, Itching and Swelling  . Hydralazine Hcl Anaphylaxis, Swelling, Palpitations and Rash  . Sulfa Antibiotics Rash and  Other (See Comments)    Other Reaction: red bumps  . Ultram [Tramadol] Hives  . Yellow Dye Anaphylaxis, Itching, Swelling and Other (See Comments)    Fluorescein (in eye drops)  . Buprenex [Buprenorphine Hcl] Palpitations and Other (See Comments)  . Keflex [Cephalexin] Diarrhea  . Lipitor [Atorvastatin] Other (See Comments)    Muscle weakness  . Restasis [Cyclosporine] Swelling    redness of face with slight swelling   . Augmentin [Amoxicillin-Pot Clavulanate] Nausea And Vomiting  . Levofloxacin Other (See Comments)    Reaction not recalled  . Morphine And Related Other (See Comments)    agitation   . Percocet [Oxycodone-Acetaminophen] Nausea And Vomiting  . Tape Other (See Comments)    Adhesive tape pulls off the skin Able to tolerate paper tape  . Zetia [Ezetimibe] Other (See Comments)    Myalgias    No current facility-administered medications on file prior to encounter.    Current Outpatient Medications on File Prior to Encounter  Medication Sig Dispense Refill  . cefixime (SUPRAX) 400 MG CAPS  capsule Take 1 capsule (400 mg total) by mouth daily. 2 capsule 0  . clonazePAM (KLONOPIN) 0.5 MG tablet Take 0.25-5 mg by mouth See admin instructions. Take 1/2 tablet (0.25mg ) in the morning, and 1 tablet (.5mg ) at night May take additional 1/2 tablet (.25mg ) in the afternoon if needed    . clopidogrel (PLAVIX) 75 MG tablet Take 1 tablet (75 mg total) by mouth daily. (Patient taking differently: Take 75 mg by mouth every morning. ) 90 tablet 3  . diltiazem (CARDIZEM CD) 180 MG 24 hr capsule Take 1 capsule (180 mg total) by mouth daily. 30 capsule 0  . donepezil (ARICEPT) 5 MG tablet Take 7.5 mg by mouth every morning.     . nitroGLYCERIN (NITROSTAT) 0.4 MG SL tablet Place 1 tablet (0.4 mg total) under the tongue every 5 (five) minutes as needed for chest pain. 25 tablet 2  . prednisoLONE acetate (PRED FORTE) 1 % ophthalmic suspension Place 1 drop into both eyes See admin instructions. 1 drop into both eyes at bedtime spaced 5 minutes apart from Systane gel    . risperiDONE (RISPERDAL) 2 MG tablet Take 2 mg by mouth at bedtime.     . sertraline (ZOLOFT) 100 MG tablet Take 150 mg by mouth every morning.   1  . traZODone (DESYREL) 50 MG tablet Take 25 mg by mouth at bedtime.      Review of Systems: A full ROS was attempted today and was not able to be performed due to AMS  Physical Examination: Pulse Rate:  [118] 118 (02/04 1737) Resp:  [22] 22 (02/04 1737) BP: (198)/(86) 198/86 (02/04 1737) SpO2:  [98 %-100 %] 100 % (02/04 1834) Weight:  [78.2 kg] 78.2 kg (02/04 1835)  General - cachectic, well developed, not responsive.    Ophthalmologic - fundi not visualized due to noncooperation.    Cardiovascular - regular rate and rhythm  Neuro - not responding to voice and pain, eyes closed, not following commands. With forced eye opening, eyes in left gaze position, left eye corneal right out, right pupil 2 mm, not reactive to light, not blinking to visual threat, doll's eyes absent, not tracking.  gag and cough absent.  Labored breathing, with gurgling sound.  Facial symmetric.  Tongue midline in mouth. On pain stimulation, no movement of all extremities. DTR diminished and no babinski. Sensation, coordination and gait not tested.   Data Reviewed: Ct Angio Head W Or  Wo Contrast  Result Date: 04/26/2018 CLINICAL DATA:  85 y/o F; altered mental status, left-sided gaze, bilateral weakness. EXAM: CT ANGIOGRAPHY HEAD AND NECK CT PERFUSION BRAIN TECHNIQUE: Multidetector CT imaging of the head and neck was performed using the standard protocol during bolus administration of intravenous contrast. Multiplanar CT image reconstructions and MIPs were obtained to evaluate the vascular anatomy. Carotid stenosis measurements (when applicable) are obtained utilizing NASCET criteria, using the distal internal carotid diameter as the denominator. Multiphase CT imaging of the brain was performed following IV bolus contrast injection. Subsequent parametric perfusion maps were calculated using RAPID software. CONTRAST:  127mL ISOVUE-370 IOPAMIDOL (ISOVUE-370) INJECTION 76% COMPARISON:  04/21/2018 in 01/15/2018 CT head. 09/21/2017 CT angiogram neck, CT angiogram head, CT perfusion head. 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 Small chronic infarctions in the cerebellum, right hemi pons, bilateral thalami. Small chronic cortical infarction in left posterior frontal lobe. Nonspecific white matter hypodensities compatible with chronic microvascular ischemic changes and volume loss of the brain are stable. No acute stroke, hemorrhage, mass effect, extra-axial collection, hydrocephalus, or herniation identified. Vascular: As below. Sinuses/Orbits: No acute finding. Other: Bilateral intra-ocular lens replacement. ASPECTS ([place] Stroke Program Early CT Score) - Ganglionic level infarction (caudate, lentiform nuclei, internal capsule, insula, M1-M3 cortex): 7 -  Supraganglionic infarction (M4-M6 cortex): 3 Total score (0-10 with 10 being normal): 10 Review of the MIP images confirms the above findings CTA NECK FINDINGS Aortic arch: Standard branching. Imaged portion shows no evidence of aneurysm or dissection. No significant stenosis of the major arch vessel origins. Mild calcific atherosclerosis. Right carotid system: No evidence of dissection, stenosis (50% or greater) or occlusion. Non stenotic calcified plaque of the carotid bifurcation. Left carotid system: No evidence of dissection, stenosis (50% or greater) or occlusion. Non stenotic calcified plaque of the carotid bifurcation. Vertebral arteries: Left dominant. No evidence of dissection, stenosis (50% or greater) or occlusion. Skeleton: Negative. Other neck: Negative. Upper chest: Negative. Review of the MIP images confirms the above findings CTA HEAD FINDINGS Anterior circulation: No high-grade proximal stenosis, proximal occlusion, aneurysm, or vascular malformation. The left MCA inferior division M3 branch is reconstituted, but with persistent proximal stenosis. Posterior circulation: No significant stenosis, proximal occlusion, aneurysm, or vascular malformation. There multiple segments of mild stenosis in the anterior and posterior intracranial circulation compatible with atherosclerosis, similar to prior CTA of the head. Venous sinuses: As permitted by contrast timing, patent. Anatomic variants: None significant. Delayed phase: No abnormal intracranial enhancement. Review of the MIP images confirms the above findings CT Brain Perfusion Findings: CBF (<30%) Volume: 71mL Perfusion (Tmax>6.0s) volume: 31mL, this is localized to the falx in likely misregistration artifact. Mismatch Volume: 49mL Infarction Location:Negative. Small focus of mildly decreased cerebral blood flow and increased cerebral blood volume within the left parietal lobe. IMPRESSION: CT head: 1. No acute intracranial abnormality identified. 2.  ASPECTS is 10. 3. Multiple stable chronic infarctions, microvascular ischemic changes, and volume loss of the brain. CTA neck: 1. Patent carotid and vertebral arteries. No dissection, aneurysm, or hemodynamically significant stenosis by NASCET criteria. 2. Mild calcific atherosclerosis of the carotid bifurcations in the aorta. CTA head: 1. No new large vessel occlusion, high-grade stenosis, aneurysm, or vascular malformation. 2. Reconstitution of left MCA inferior division M3 branch, persistent stenosis of the branch origin. 3. Intracranial atherosclerosis with multiple segments of mild stenosis in the anterior and posterior circulation. CT perfusion head: 1. No acute infarction or ischemic penumbra by standard CT perfusion criteria. 2.  Small focus of mildly decreased cerebral blood flow and increased cerebral blood volume in the left parietal lobe compatible with oligemia without ischemia. These results were called by telephone at the time of interpretation on 04/26/2018 at 6:30 pm to Dr. Cheral Marker, who verbally acknowledged these results. Electronically Signed   By: Kristine Garbe M.D.   On: 04/26/2018 18:33   Ct Head Wo Contrast  Result Date: 04/21/2018 CLINICAL DATA:  Altered level of consciousness, history stroke, coronary artery disease post and high, hypertension, blindness LEFT eye EXAM: CT HEAD WITHOUT CONTRAST TECHNIQUE: Contiguous axial images were obtained from the base of the skull through the vertex without intravenous contrast. Sagittal and coronal MPR images reconstructed from axial data set. COMPARISON:  01/15/2018 FINDINGS: Brain: Generalized atrophy. Normal ventricular morphology. No midline shift or mass effect. Small vessel chronic ischemic changes of deep cerebral white matter. Large old LEFT temporoparietal infarct. Old RIGHT occipital infarct. Old RIGHT thalamic infarct. No intracranial hemorrhage, mass lesion, or evidence of acute infarction. No extra-axial fluid collections.  Vascular: Minimal atherosclerotic calcifications of internal carotid arteries at skull base. No hyperdense vessels. Skull: Intact Sinuses/Orbits: Clear Other: N/A IMPRESSION: Atrophy with small vessel chronic ischemic changes of deep cerebral white matter. Old LEFT temporoparietal, RIGHT occipital and RIGHT thalamic infarcts. No acute intracranial abnormalities. Electronically Signed   By: Lavonia Dana M.D.   On: 04/21/2018 14:37   Ct Angio Neck W Or Wo Contrast  Result Date: 04/26/2018 CLINICAL DATA:  85 y/o F; altered mental status, left-sided gaze, bilateral weakness. EXAM: CT ANGIOGRAPHY HEAD AND NECK CT PERFUSION BRAIN TECHNIQUE: Multidetector CT imaging of the head and neck was performed using the standard protocol during bolus administration of intravenous contrast. Multiplanar CT image reconstructions and MIPs were obtained to evaluate the vascular anatomy. Carotid stenosis measurements (when applicable) are obtained utilizing NASCET criteria, using the distal internal carotid diameter as the denominator. Multiphase CT imaging of the brain was performed following IV bolus contrast injection. Subsequent parametric perfusion maps were calculated using RAPID software. CONTRAST:  132mL ISOVUE-370 IOPAMIDOL (ISOVUE-370) INJECTION 76% COMPARISON:  04/21/2018 in 01/15/2018 CT head. 09/21/2017 CT angiogram neck, CT angiogram head, CT perfusion head. 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 Small chronic infarctions in the cerebellum, right hemi pons, bilateral thalami. Small chronic cortical infarction in left posterior frontal lobe. Nonspecific white matter hypodensities compatible with chronic microvascular ischemic changes and volume loss of the brain are stable. No acute stroke, hemorrhage, mass effect, extra-axial collection, hydrocephalus, or herniation identified. Vascular: As below. Sinuses/Orbits: No acute finding. Other: Bilateral intra-ocular  lens replacement. ASPECTS ([place] Stroke Program Early CT Score) - Ganglionic level infarction (caudate, lentiform nuclei, internal capsule, insula, M1-M3 cortex): 7 - Supraganglionic infarction (M4-M6 cortex): 3 Total score (0-10 with 10 being normal): 10 Review of the MIP images confirms the above findings CTA NECK FINDINGS Aortic arch: Standard branching. Imaged portion shows no evidence of aneurysm or dissection. No significant stenosis of the major arch vessel origins. Mild calcific atherosclerosis. Right carotid system: No evidence of dissection, stenosis (50% or greater) or occlusion. Non stenotic calcified plaque of the carotid bifurcation. Left carotid system: No evidence of dissection, stenosis (50% or greater) or occlusion. Non stenotic calcified plaque of the carotid bifurcation. Vertebral arteries: Left dominant. No evidence of dissection, stenosis (50% or greater) or occlusion. Skeleton: Negative. Other neck: Negative. Upper chest: Negative. Review of the MIP images confirms the above findings CTA HEAD FINDINGS Anterior circulation: No high-grade proximal  stenosis, proximal occlusion, aneurysm, or vascular malformation. The left MCA inferior division M3 branch is reconstituted, but with persistent proximal stenosis. Posterior circulation: No significant stenosis, proximal occlusion, aneurysm, or vascular malformation. There multiple segments of mild stenosis in the anterior and posterior intracranial circulation compatible with atherosclerosis, similar to prior CTA of the head. Venous sinuses: As permitted by contrast timing, patent. Anatomic variants: None significant. Delayed phase: No abnormal intracranial enhancement. Review of the MIP images confirms the above findings CT Brain Perfusion Findings: CBF (<30%) Volume: 71mL Perfusion (Tmax>6.0s) volume: 16mL, this is localized to the falx in likely misregistration artifact. Mismatch Volume: 37mL Infarction Location:Negative. Small focus of mildly  decreased cerebral blood flow and increased cerebral blood volume within the left parietal lobe. IMPRESSION: CT head: 1. No acute intracranial abnormality identified. 2. ASPECTS is 10. 3. Multiple stable chronic infarctions, microvascular ischemic changes, and volume loss of the brain. CTA neck: 1. Patent carotid and vertebral arteries. No dissection, aneurysm, or hemodynamically significant stenosis by NASCET criteria. 2. Mild calcific atherosclerosis of the carotid bifurcations in the aorta. CTA head: 1. No new large vessel occlusion, high-grade stenosis, aneurysm, or vascular malformation. 2. Reconstitution of left MCA inferior division M3 branch, persistent stenosis of the branch origin. 3. Intracranial atherosclerosis with multiple segments of mild stenosis in the anterior and posterior circulation. CT perfusion head: 1. No acute infarction or ischemic penumbra by standard CT perfusion criteria. 2. Small focus of mildly decreased cerebral blood flow and increased cerebral blood volume in the left parietal lobe compatible with oligemia without ischemia. These results were called by telephone at the time of interpretation on 04/26/2018 at 6:30 pm to Dr. Cheral Marker, who verbally acknowledged these results. Electronically Signed   By: Kristine Garbe M.D.   On: 04/26/2018 18:33   Ct Cerebral Perfusion W Contrast  Result Date: 04/26/2018 CLINICAL DATA:  85 y/o F; altered mental status, left-sided gaze, bilateral weakness. EXAM: CT ANGIOGRAPHY HEAD AND NECK CT PERFUSION BRAIN TECHNIQUE: Multidetector CT imaging of the head and neck was performed using the standard protocol during bolus administration of intravenous contrast. Multiplanar CT image reconstructions and MIPs were obtained to evaluate the vascular anatomy. Carotid stenosis measurements (when applicable) are obtained utilizing NASCET criteria, using the distal internal carotid diameter as the denominator. Multiphase CT imaging of the brain was  performed following IV bolus contrast injection. Subsequent parametric perfusion maps were calculated using RAPID software. CONTRAST:  145mL ISOVUE-370 IOPAMIDOL (ISOVUE-370) INJECTION 76% COMPARISON:  04/21/2018 in 01/15/2018 CT head. 09/21/2017 CT angiogram neck, CT angiogram head, CT perfusion head. 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 Small chronic infarctions in the cerebellum, right hemi pons, bilateral thalami. Small chronic cortical infarction in left posterior frontal lobe. Nonspecific white matter hypodensities compatible with chronic microvascular ischemic changes and volume loss of the brain are stable. No acute stroke, hemorrhage, mass effect, extra-axial collection, hydrocephalus, or herniation identified. Vascular: As below. Sinuses/Orbits: No acute finding. Other: Bilateral intra-ocular lens replacement. ASPECTS ([place] Stroke Program Early CT Score) - Ganglionic level infarction (caudate, lentiform nuclei, internal capsule, insula, M1-M3 cortex): 7 - Supraganglionic infarction (M4-M6 cortex): 3 Total score (0-10 with 10 being normal): 10 Review of the MIP images confirms the above findings CTA NECK FINDINGS Aortic arch: Standard branching. Imaged portion shows no evidence of aneurysm or dissection. No significant stenosis of the major arch vessel origins. Mild calcific atherosclerosis. Right carotid system: No evidence of dissection, stenosis (50% or greater) or occlusion. Non stenotic  calcified plaque of the carotid bifurcation. Left carotid system: No evidence of dissection, stenosis (50% or greater) or occlusion. Non stenotic calcified plaque of the carotid bifurcation. Vertebral arteries: Left dominant. No evidence of dissection, stenosis (50% or greater) or occlusion. Skeleton: Negative. Other neck: Negative. Upper chest: Negative. Review of the MIP images confirms the above findings CTA HEAD FINDINGS Anterior circulation: No  high-grade proximal stenosis, proximal occlusion, aneurysm, or vascular malformation. The left MCA inferior division M3 branch is reconstituted, but with persistent proximal stenosis. Posterior circulation: No significant stenosis, proximal occlusion, aneurysm, or vascular malformation. There multiple segments of mild stenosis in the anterior and posterior intracranial circulation compatible with atherosclerosis, similar to prior CTA of the head. Venous sinuses: As permitted by contrast timing, patent. Anatomic variants: None significant. Delayed phase: No abnormal intracranial enhancement. Review of the MIP images confirms the above findings CT Brain Perfusion Findings: CBF (<30%) Volume: 66mL Perfusion (Tmax>6.0s) volume: 30mL, this is localized to the falx in likely misregistration artifact. Mismatch Volume: 62mL Infarction Location:Negative. Small focus of mildly decreased cerebral blood flow and increased cerebral blood volume within the left parietal lobe. IMPRESSION: CT head: 1. No acute intracranial abnormality identified. 2. ASPECTS is 10. 3. Multiple stable chronic infarctions, microvascular ischemic changes, and volume loss of the brain. CTA neck: 1. Patent carotid and vertebral arteries. No dissection, aneurysm, or hemodynamically significant stenosis by NASCET criteria. 2. Mild calcific atherosclerosis of the carotid bifurcations in the aorta. CTA head: 1. No new large vessel occlusion, high-grade stenosis, aneurysm, or vascular malformation. 2. Reconstitution of left MCA inferior division M3 branch, persistent stenosis of the branch origin. 3. Intracranial atherosclerosis with multiple segments of mild stenosis in the anterior and posterior circulation. CT perfusion head: 1. No acute infarction or ischemic penumbra by standard CT perfusion criteria. 2. Small focus of mildly decreased cerebral blood flow and increased cerebral blood volume in the left parietal lobe compatible with oligemia without  ischemia. These results were called by telephone at the time of interpretation on 04/26/2018 at 6:30 pm to Dr. Cheral Marker, who verbally acknowledged these results. Electronically Signed   By: Kristine Garbe M.D.   On: 04/26/2018 18:33   Dg Chest Portable 1 View  Result Date: 04/21/2018 CLINICAL DATA:  Tachycardia EXAM: PORTABLE CHEST 1 VIEW COMPARISON:  December 13, 2017 FINDINGS: The heart size and mediastinal contours are stable. The heart size is enlarged. The aorta is tortuous. Both lungs are clear. The visualized skeletal structures are stable. IMPRESSION: No active cardiopulmonary disease.  Cardiomegaly. Electronically Signed   By: Abelardo Diesel M.D.   On: 04/21/2018 13:11   Ct Head Code Stroke Wo Contrast  Result Date: 04/26/2018 CLINICAL DATA:  85 y/o F; altered mental status, left-sided gaze, bilateral weakness. EXAM: CT ANGIOGRAPHY HEAD AND NECK CT PERFUSION BRAIN TECHNIQUE: Multidetector CT imaging of the head and neck was performed using the standard protocol during bolus administration of intravenous contrast. Multiplanar CT image reconstructions and MIPs were obtained to evaluate the vascular anatomy. Carotid stenosis measurements (when applicable) are obtained utilizing NASCET criteria, using the distal internal carotid diameter as the denominator. Multiphase CT imaging of the brain was performed following IV bolus contrast injection. Subsequent parametric perfusion maps were calculated using RAPID software. CONTRAST:  127mL ISOVUE-370 IOPAMIDOL (ISOVUE-370) INJECTION 76% COMPARISON:  04/21/2018 in 01/15/2018 CT head. 09/21/2017 CT angiogram neck, CT angiogram head, CT perfusion head. 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 Small chronic infarctions in the cerebellum, right  hemi pons, bilateral thalami. Small chronic cortical infarction in left posterior frontal lobe. Nonspecific white matter hypodensities compatible with  chronic microvascular ischemic changes and volume loss of the brain are stable. No acute stroke, hemorrhage, mass effect, extra-axial collection, hydrocephalus, or herniation identified. Vascular: As below. Sinuses/Orbits: No acute finding. Other: Bilateral intra-ocular lens replacement. ASPECTS (Parkman Stroke Program Early CT Score) - Ganglionic level infarction (caudate, lentiform nuclei, internal capsule, insula, M1-M3 cortex): 7 - Supraganglionic infarction (M4-M6 cortex): 3 Total score (0-10 with 10 being normal): 10 Review of the MIP images confirms the above findings CTA NECK FINDINGS Aortic arch: Standard branching. Imaged portion shows no evidence of aneurysm or dissection. No significant stenosis of the major arch vessel origins. Mild calcific atherosclerosis. Right carotid system: No evidence of dissection, stenosis (50% or greater) or occlusion. Non stenotic calcified plaque of the carotid bifurcation. Left carotid system: No evidence of dissection, stenosis (50% or greater) or occlusion. Non stenotic calcified plaque of the carotid bifurcation. Vertebral arteries: Left dominant. No evidence of dissection, stenosis (50% or greater) or occlusion. Skeleton: Negative. Other neck: Negative. Upper chest: Negative. Review of the MIP images confirms the above findings CTA HEAD FINDINGS Anterior circulation: No high-grade proximal stenosis, proximal occlusion, aneurysm, or vascular malformation. The left MCA inferior division M3 branch is reconstituted, but with persistent proximal stenosis. Posterior circulation: No significant stenosis, proximal occlusion, aneurysm, or vascular malformation. There multiple segments of mild stenosis in the anterior and posterior intracranial circulation compatible with atherosclerosis, similar to prior CTA of the head. Venous sinuses: As permitted by contrast timing, patent. Anatomic variants: None significant. Delayed phase: No abnormal intracranial enhancement. Review of the  MIP images confirms the above findings CT Brain Perfusion Findings: CBF (<30%) Volume: 75mL Perfusion (Tmax>6.0s) volume: 25mL, this is localized to the falx in likely misregistration artifact. Mismatch Volume: 67mL Infarction Location:Negative. Small focus of mildly decreased cerebral blood flow and increased cerebral blood volume within the left parietal lobe. IMPRESSION: CT head: 1. No acute intracranial abnormality identified. 2. ASPECTS is 10. 3. Multiple stable chronic infarctions, microvascular ischemic changes, and volume loss of the brain. CTA neck: 1. Patent carotid and vertebral arteries. No dissection, aneurysm, or hemodynamically significant stenosis by NASCET criteria. 2. Mild calcific atherosclerosis of the carotid bifurcations in the aorta. CTA head: 1. No new large vessel occlusion, high-grade stenosis, aneurysm, or vascular malformation. 2. Reconstitution of left MCA inferior division M3 branch, persistent stenosis of the branch origin. 3. Intracranial atherosclerosis with multiple segments of mild stenosis in the anterior and posterior circulation. CT perfusion head: 1. No acute infarction or ischemic penumbra by standard CT perfusion criteria. 2. Small focus of mildly decreased cerebral blood flow and increased cerebral blood volume in the left parietal lobe compatible with oligemia without ischemia. These results were called by telephone at the time of interpretation on 04/26/2018 at 6:30 pm to Dr. Cheral Marker, who verbally acknowledged these results. Electronically Signed   By: Kristine Garbe M.D.   On: 04/26/2018 18:33    Assessment: 85 y.o. female with PMH of stroke, afib not on AC, GIB, seizure, CHF, left eye blind, right eye cortical blindness, CAD presented with seizure and left gaze.  She had one seizure at home, one seizure with EMS, and one seizure in ER.  Currently postictal, lack of response, risk of airway.  Long-term EEG is being hooked up to rule out NCSE.  Patient DNR/DNI, not  intubated, try to avoid excessive sedation for airway protection.  Her presentation of left  gaze likely due to postictal state, instead of new stroke although she has high risk for embolic stroke due to A. fib not on AC.  CTs, CTA head and neck, CT perfusion unrevealing.  Received Vimpat 200 mg IV load.  Plan: - left gaze likely due to postictal state - Long-term EEG - Status post Vimpat 200 IV load, continue Vimpat 100 mg twice daily.  May consider more AEDs based on long-term EEG finding. - DNR/DNI, try to avoid excessive sedation given risk of airway - discussed with daughter, POA, regarding [place], she is still not deciding and would like to discuss with other family members. - Recommend to admit to medicine on stepdown unit, discussed with Dr. Alvino Chapel and Dr. Birder Robson.  Thank you for this consultation and allowing [place] to participate in the care of this patient.  This patient is critically ill due to status epilepticus, history of stroke, A. fib RVR, risk of airway and at significant risk of neurological worsening, death form aspiration, heart failure, status epilepticus, cardiac arrest, respiratory failure. This patient's care requires constant monitoring of vital signs, hemodynamics, respiratory and cardiac monitoring, review of multiple databases, neurological assessment, discussion with family, other specialists and medical decision making of high complexity. I spent 50 minutes of neurocritical care time in the care of this patient.  Rosalin Hawking, MD PhD Stroke Neurology 04/26/2018 7:50 PM

## 2018-04-26 NOTE — Progress Notes (Addendum)
EEG with pattern concerning for focal status epilepticus.  I had another lengthy discussion with family regarding further treatment options.  I discussed further treatment of antiepileptics which would be indicated regardless of her feelings of aggressiveness in the setting of partial status, however if she continues to have seizures despite treatment, I discussed if she would want interventions that would necessitate intubation.  I discussed that with medications I am using for treatment, she could become more sedated which could prevent her from breathing and this could be fatal.  She has been clear in the past that she would not want to be kept alive on machines in any situation and therefore family does not feel that she would want intubation in the setting.  I will load her with IV Depakote and she may stop seizing over the next few hours.  If she continues in status epilepticus, with each day this would become less likely to have a good outcome and transition to comfort care would need to be strongly considered.  Roland Rack, MD [facility] [phone]  If 7pm- 7am, please page neurology on call as listed in [place].

## 2018-04-26 NOTE — Progress Notes (Signed)
CRITICAL VALUE ALERT  Critical Value:  lACTIC ACID LEVEL 4.3  Date & Time Notied:  04/26/2018, 2348  Provider Notified: NP Forrest Moron  Orders Received/Actions taken: No new orders

## 2018-04-26 NOTE — H&P (Signed)
History and Physical    [name] [name] VQQ:[id number] DOB: Jul 25, 1924 DOA: 04/26/2018  PCP: Darcus Austin, MD (Inactive)  Patient coming from: Home  I have personally briefly reviewed patient's old medical records in [place]  Chief Complaint: Seizures  HPI: [name] [name] is a 85 y.o. female with medical history significant of PAF, stroke, vascular dementia, blind in L eye, minimal vision in R, HTN.  Patient had seizure at home, second seizure for EMS.  Eyes deviated to left.   ED Course: Patient having ongoing episodes of seizures while in ED.  Given Ativan.  CTA head and neck neg for perfusion deficit or emergent large vessel occlusion.  EEG performed which confirms NCSE.  Neurology saw patient.  Loaded with IV depacote, see their note.  Patient now having episodes of convulsive SE as well at time of my exam.   Review of Systems: Unable to perform due to AMS  Past Medical History:  Diagnosis Date  . Arthritis    "thumbs, joints" (03/23/2017  . Atrial fibrillation ([place]) 04/21/2018  . Basal cell carcinoma    "several; scattered over my face, hands, leg some cut off; some burned off"  . Blind left eye    a. Corneal transplant x3 with rejection  . CAD (coronary artery disease)    a. s/p NSTEMI 2015 treated conservatively; nuclear stress test low risk. b. Continued angina despite med rx 07/2015 - s/p Bio Freedom Stent to ramus intermedius with mod LAD stenosis neg by FFR. // Nuc study 5/19:  EF 67, no ischemia or scar; Low Risk   . Complication of anesthesia    "very sensitive to RX"  . Diverticulitis large intestine   . Diverticulosis   . Family history of adverse reaction to anesthesia    "we all get PONV" (1/1/201)  . Gastritis   . GERD (gastroesophageal reflux disease)   . Hayfever   . Helicobacter pylori (H. pylori) 05/22/02   RUT-Positive  . Hypertension   . Myocardial infarction ([place]) 2015  . Squamous carcinoma    "several; scattered over my face, hands, leg some  cut off; some burned off"  . Stroke [facility]) 2015   denies residual on 08/07/2015  . Stroke [facility]) 03/18/2017   "has effected her vision, balance, some memory" (03/23/2017)  . SVT (supraventricular tachycardia) (HCC)    a. seen by Dr. Caryl Comes - has loop recorder in. Patient has declined amiodarone due to side effect profile  . Varicose veins     Past Surgical History:  Procedure Laterality Date  . BASAL CELL CARCINOMA EXCISION     "several; scattered over my face, hands, leg some cut off; some burned off"  . CARDIAC CATHETERIZATION N/A 08/07/2015   Procedure: Left Heart Cath and Coronary Angiography;  Surgeon: [name], MD;  Location: [place] CV [facility];  Service: Cardiovascular;  Laterality: N/A;  . CARDIAC CATHETERIZATION N/A 08/07/2015   Procedure: Coronary Stent Intervention;  Surgeon: [name], MD;  Location: [place] [facility];  Service: Cardiovascular;  Laterality: N/A;  . CARDIAC CATHETERIZATION N/A 08/07/2015   Procedure: Intravascular Pressure Wire/FFR Study;  Surgeon: [name], MD;  Location: [place] [facility];  Service: Cardiovascular;  Laterality: N/A;  . CATARACT EXTRACTION W/ INTRAOCULAR LENS  IMPLANT, BILATERAL Bilateral 2005  . CLOSED REDUCTION SHOULDER DISLOCATION Left 2016 X 2  . CORNEAL TRANSPLANT Bilateral right 2009, left 2009    3 in left eye (last 2 failed), 1 in right eye  . DILATION AND CURETTAGE OF  UTERUS    . EYE SURGERY    . JOINT REPLACEMENT    . [place]   "had gangrene in it"  . LOOP RECORDER IMPLANT N/A 01/05/2014   Procedure: LOOP RECORDER IMPLANT;  Surgeon: Coralyn Mark, MD;  Location: [place] CATH LAB;  Service: Cardiovascular;  Laterality: N/A;  . SQUAMOUS CELL CARCINOMA EXCISION     "several; scattered over my face, hands, leg some cut off; some burned off"  . TONSILLECTOMY    . TOTAL ABDOMINAL HYSTERECTOMY  11/1983   "ovaries and all"  . TOTAL KNEE ARTHROPLASTY Left 06/27/2012   Procedure: LEFT TOTAL KNEE  ARTHROPLASTY;  Surgeon: Gearlean Alf, MD;  Location: [facility];  Service: Orthopedics;  Laterality: Left;  . TOTAL KNEE ARTHROPLASTY Right 12/12/2012   Procedure: RIGHT TOTAL KNEE ARTHROPLASTY;  Surgeon: Gearlean Alf, MD;  Location: [facility];  Service: Orthopedics;  Laterality: Right;  . TUBAL LIGATION  1966     reports that she has never smoked. She has never used smokeless tobacco. She reports that she does not drink alcohol or use drugs.  Allergies  Allergen Reactions  . Fluorescein Anaphylaxis, Shortness Of Breath, Itching and Swelling  . Hydralazine Hcl Anaphylaxis, Swelling, Palpitations and Rash  . Sulfa Antibiotics Rash and Other (See Comments)    Other Reaction: red bumps  . Ultram [Tramadol] Hives  . Yellow Dye Anaphylaxis, Itching, Swelling and Other (See Comments)    Fluorescein (in eye drops)  . Buprenex [Buprenorphine Hcl] Palpitations and Other (See Comments)  . Keflex [Cephalexin] Diarrhea  . Lipitor [Atorvastatin] Other (See Comments)    Muscle weakness  . Restasis [Cyclosporine] Swelling    redness of face with slight swelling   . Augmentin [Amoxicillin-Pot Clavulanate] Nausea And Vomiting  . Levofloxacin Other (See Comments)    Reaction not recalled  . Morphine And Related Other (See Comments)    agitation   . Percocet [Oxycodone-Acetaminophen] Nausea And Vomiting  . Tape Other (See Comments)    Adhesive tape pulls off the skin Able to tolerate paper tape  . Zetia [Ezetimibe] Other (See Comments)    Myalgias    Family History  Problem Relation Age of Onset  . Stroke Mother   . Heart attack Father   . Heart attack Brother   . Cancer Brother   . Heart attack Brother   . Heart attack Brother   . Heart attack Brother   . Parkinsonism Brother      Prior to Admission medications   Medication Sig Start Date End Date Taking? Authorizing Provider  cefixime (SUPRAX) 400 MG CAPS capsule Take 1 capsule (400 mg total) by mouth daily. 04/23/18   Elgergawy, Silver Huguenin, MD  clonazePAM (KLONOPIN) 0.5 MG tablet Take 0.25-5 mg by mouth See admin instructions. Take 1/2 tablet (0.25mg ) in the morning, and 1 tablet (.5mg ) at night May take additional 1/2 tablet (.25mg ) in the afternoon if needed 03/10/18   [provider]  clopidogrel (PLAVIX) 75 MG tablet Take 1 tablet (75 mg total) by mouth daily. Patient taking differently: Take 75 mg by mouth every morning.  12/30/17   [name], MD  diltiazem (CARDIZEM CD) 180 MG 24 hr capsule Take 1 capsule (180 mg total) by mouth daily. 04/22/18   Elgergawy, Silver Huguenin, MD  donepezil (ARICEPT) 5 MG tablet Take 7.5 mg by mouth every morning.  01/13/18   [provider]  nitroGLYCERIN (NITROSTAT) 0.4 MG SL tablet Place 1 tablet (0.4 mg total)  under the tongue every 5 (five) minutes as needed for chest pain. 08/08/15   Lyda Jester M, PA-C  prednisoLONE acetate (PRED FORTE) 1 % ophthalmic suspension Place 1 drop into both eyes See admin instructions. 1 drop into both eyes at bedtime spaced 5 minutes apart from Systane gel    [provider]  risperiDONE (RISPERDAL) 2 MG tablet Take 2 mg by mouth at bedtime.  01/13/18   [provider]  sertraline (ZOLOFT) 100 MG tablet Take 150 mg by mouth every morning.  10/28/17   [provider]  traZODone (DESYREL) 50 MG tablet Take 25 mg by mouth at bedtime. 03/10/18   [provider]    Physical Exam: Vitals:   04/26/18 1900 04/26/18 1930 04/26/18 2000 04/26/18 2015  BP: (!) 167/82 (!) 190/84 (!) 209/91 (!) 177/84  Pulse: 92 83 98 80  Resp: 20 19 (!) 33 (!) 27  SpO2: 100% 100% 95% 100%  Weight:        Constitutional: cachectic, non responsive. Eyes: PERRL, lids and conjunctivae normal ENMT: Mucous membranes are moist. Posterior pharynx clear of any exudate or lesions.Normal dentition.  Neck: normal, supple, no masses, no thyromegaly Respiratory: Coarse breath sounds, worrisome for aspiration. Cardiovascular: Regular rate  and rhythm, no murmurs / rubs / gallops. No extremity edema. 2+ pedal pulses. No carotid bruits.  Abdomen: no tenderness, no masses palpated. No hepatosplenomegaly. Bowel sounds positive.  Musculoskeletal: no clubbing / cyanosis. No joint deformity upper and lower extremities. Good ROM, no contractures. Normal muscle tone.  Skin: no rashes, lesions, ulcers. No induration Neurologic: GCS 3 at the moment., eyes in L gaze position. Psychiatric: GCS 3   Labs on Admission: I have personally reviewed following labs and imaging studies  CBC: Recent Labs  Lab 04/21/18 1201 04/22/18 0551 04/26/18 1747  WBC 7.0 6.2 6.3  NEUTROABS 4.9  --  4.1  HGB 14.5 12.6 13.4  HCT 45.3 40.6 43.7  MCV 95.8 94.6 97.5  PLT 210 184 220   Basic Metabolic Panel: Recent Labs  Lab 04/21/18 1201 04/22/18 0551 04/26/18 1747  NA 138 138 138  K 4.1 3.9 4.9  CL 104 103 102  CO2 26 27 18*  GLUCOSE 139* 109* 161*  BUN 21 19 18   CREATININE 0.95 0.76 0.82  0.50  CALCIUM 8.9 8.7* 9.1  MG 2.2  --   --    GFR: Estimated Creatinine Clearance: 43.2 mL/min (by C-G formula based on SCr of 0.82 mg/dL). Liver Function Tests: Recent Labs  Lab 04/26/18 1747  AST 47*  ALT 18  ALKPHOS 86  BILITOT 1.5*  PROT 6.3*  ALBUMIN 3.5   No results for input(s): LIPASE, AMYLASE in the last 168 hours. No results for input(s): AMMONIA in the last 168 hours. Coagulation Profile: Recent Labs  Lab 04/26/18 1747  INR 1.08   Cardiac Enzymes: No results for input(s): CKTOTAL, CKMB, CKMBINDEX, TROPONINI in the last 168 hours. BNP (last 3 results) No results for input(s): PROBNP in the last 8760 hours. HbA1C: No results for input(s): HGBA1C in the last 72 hours. CBG: Recent Labs  Lab 04/26/18 1737  GLUCAP 137*   Lipid Profile: No results for input(s): CHOL, HDL, LDLCALC, TRIG, CHOLHDL, LDLDIRECT in the last 72 hours. Thyroid Function Tests: No results for input(s): TSH, T4TOTAL, FREET4, T3FREE, THYROIDAB in the  last 72 hours. Anemia Panel: No results for input(s): VITAMINB12, FOLATE, FERRITIN, TIBC, IRON, RETICCTPCT in the last 72 hours. Urine analysis:    Component Value  Date/Time   COLORURINE YELLOW 04/21/2018 [place] 04/21/2018 1201   LABSPEC 1.009 04/21/2018 1201   PHURINE 6.0 04/21/2018 1201   GLUCOSEU NEGATIVE 04/21/2018 1201   [place] 04/21/2018 [place] 04/21/2018 [place] 04/21/2018 1201   PROTEINUR NEGATIVE 04/21/2018 1201   UROBILINOGEN 0.2 12/19/2014 1830   NITRITE NEGATIVE 04/21/2018 1201   LEUKOCYTESUR MODERATE (A) 04/21/2018 1201    Radiological Exams on Admission: Ct Angio Head W Or Wo Contrast  Result Date: 04/26/2018 CLINICAL DATA:  85 y/o F; altered mental status, left-sided gaze, bilateral weakness. EXAM: CT ANGIOGRAPHY HEAD AND NECK CT PERFUSION BRAIN TECHNIQUE: Multidetector CT imaging of the head and neck was performed using the standard protocol during bolus administration of intravenous contrast. Multiplanar CT image reconstructions and MIPs were obtained to evaluate the vascular anatomy. Carotid stenosis measurements (when applicable) are obtained utilizing NASCET criteria, using the distal internal carotid diameter as the denominator. Multiphase CT imaging of the brain was performed following IV bolus contrast injection. Subsequent parametric perfusion maps were calculated using RAPID software. CONTRAST:  149mL ISOVUE-370 IOPAMIDOL (ISOVUE-370) INJECTION 76% COMPARISON:  04/21/2018 in 01/15/2018 CT head. 09/21/2017 CT angiogram neck, CT angiogram head, CT perfusion head. 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 Small chronic infarctions in the cerebellum, right hemi pons, bilateral thalami. Small chronic cortical infarction in left posterior frontal lobe. Nonspecific white matter hypodensities compatible with chronic microvascular ischemic changes and  volume loss of the brain are stable. No acute stroke, hemorrhage, mass effect, extra-axial collection, hydrocephalus, or herniation identified. Vascular: As below. Sinuses/Orbits: No acute finding. Other: Bilateral intra-ocular lens replacement. ASPECTS ([place] Stroke Program Early CT Score) - Ganglionic level infarction (caudate, lentiform nuclei, internal capsule, insula, M1-M3 cortex): 7 - Supraganglionic infarction (M4-M6 cortex): 3 Total score (0-10 with 10 being normal): 10 Review of the MIP images confirms the above findings CTA NECK FINDINGS Aortic arch: Standard branching. Imaged portion shows no evidence of aneurysm or dissection. No significant stenosis of the major arch vessel origins. Mild calcific atherosclerosis. Right carotid system: No evidence of dissection, stenosis (50% or greater) or occlusion. Non stenotic calcified plaque of the carotid bifurcation. Left carotid system: No evidence of dissection, stenosis (50% or greater) or occlusion. Non stenotic calcified plaque of the carotid bifurcation. Vertebral arteries: Left dominant. No evidence of dissection, stenosis (50% or greater) or occlusion. Skeleton: Negative. Other neck: Negative. Upper chest: Negative. Review of the MIP images confirms the above findings CTA HEAD FINDINGS Anterior circulation: No high-grade proximal stenosis, proximal occlusion, aneurysm, or vascular malformation. The left MCA inferior division M3 branch is reconstituted, but with persistent proximal stenosis. Posterior circulation: No significant stenosis, proximal occlusion, aneurysm, or vascular malformation. There multiple segments of mild stenosis in the anterior and posterior intracranial circulation compatible with atherosclerosis, similar to prior CTA of the head. Venous sinuses: As permitted by contrast timing, patent. Anatomic variants: None significant. Delayed phase: No abnormal intracranial enhancement. Review of the MIP images confirms the above findings CT  Brain Perfusion Findings: CBF (<30%) Volume: 83mL Perfusion (Tmax>6.0s) volume: 57mL, this is localized to the falx in likely misregistration artifact. Mismatch Volume: 49mL Infarction Location:Negative. Small focus of mildly decreased cerebral blood flow and increased cerebral blood volume within the left parietal lobe. IMPRESSION: CT head: 1. No acute intracranial abnormality identified. 2. ASPECTS is 10. 3. Multiple stable chronic infarctions, microvascular ischemic changes, and volume loss of the brain. CTA neck: 1.  Patent carotid and vertebral arteries. No dissection, aneurysm, or hemodynamically significant stenosis by NASCET criteria. 2. Mild calcific atherosclerosis of the carotid bifurcations in the aorta. CTA head: 1. No new large vessel occlusion, high-grade stenosis, aneurysm, or vascular malformation. 2. Reconstitution of left MCA inferior division M3 branch, persistent stenosis of the branch origin. 3. Intracranial atherosclerosis with multiple segments of mild stenosis in the anterior and posterior circulation. CT perfusion head: 1. No acute infarction or ischemic penumbra by standard CT perfusion criteria. 2. Small focus of mildly decreased cerebral blood flow and increased cerebral blood volume in the left parietal lobe compatible with oligemia without ischemia. These results were called by telephone at the time of interpretation on 04/26/2018 at 6:30 pm to Dr. Cheral Marker, who verbally acknowledged these results. Electronically Signed   By: Kristine Garbe M.D.   On: 04/26/2018 18:33   Ct Angio Neck W Or Wo Contrast  Result Date: 04/26/2018 CLINICAL DATA:  85 y/o F; altered mental status, left-sided gaze, bilateral weakness. EXAM: CT ANGIOGRAPHY HEAD AND NECK CT PERFUSION BRAIN TECHNIQUE: Multidetector CT imaging of the head and neck was performed using the standard protocol during bolus administration of intravenous contrast. Multiplanar CT image reconstructions and MIPs were obtained to  evaluate the vascular anatomy. Carotid stenosis measurements (when applicable) are obtained utilizing NASCET criteria, using the distal internal carotid diameter as the denominator. Multiphase CT imaging of the brain was performed following IV bolus contrast injection. Subsequent parametric perfusion maps were calculated using RAPID software. CONTRAST:  164mL ISOVUE-370 IOPAMIDOL (ISOVUE-370) INJECTION 76% COMPARISON:  04/21/2018 in 01/15/2018 CT head. 09/21/2017 CT angiogram neck, CT angiogram head, CT perfusion head. 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 Small chronic infarctions in the cerebellum, right hemi pons, bilateral thalami. Small chronic cortical infarction in left posterior frontal lobe. Nonspecific white matter hypodensities compatible with chronic microvascular ischemic changes and volume loss of the brain are stable. No acute stroke, hemorrhage, mass effect, extra-axial collection, hydrocephalus, or herniation identified. Vascular: As below. Sinuses/Orbits: No acute finding. Other: Bilateral intra-ocular lens replacement. ASPECTS ([place] Stroke Program Early CT Score) - Ganglionic level infarction (caudate, lentiform nuclei, internal capsule, insula, M1-M3 cortex): 7 - Supraganglionic infarction (M4-M6 cortex): 3 Total score (0-10 with 10 being normal): 10 Review of the MIP images confirms the above findings CTA NECK FINDINGS Aortic arch: Standard branching. Imaged portion shows no evidence of aneurysm or dissection. No significant stenosis of the major arch vessel origins. Mild calcific atherosclerosis. Right carotid system: No evidence of dissection, stenosis (50% or greater) or occlusion. Non stenotic calcified plaque of the carotid bifurcation. Left carotid system: No evidence of dissection, stenosis (50% or greater) or occlusion. Non stenotic calcified plaque of the carotid bifurcation. Vertebral arteries: Left dominant. No evidence  of dissection, stenosis (50% or greater) or occlusion. Skeleton: Negative. Other neck: Negative. Upper chest: Negative. Review of the MIP images confirms the above findings CTA HEAD FINDINGS Anterior circulation: No high-grade proximal stenosis, proximal occlusion, aneurysm, or vascular malformation. The left MCA inferior division M3 branch is reconstituted, but with persistent proximal stenosis. Posterior circulation: No significant stenosis, proximal occlusion, aneurysm, or vascular malformation. There multiple segments of mild stenosis in the anterior and posterior intracranial circulation compatible with atherosclerosis, similar to prior CTA of the head. Venous sinuses: As permitted by contrast timing, patent. Anatomic variants: None significant. Delayed phase: No abnormal intracranial enhancement. Review of the MIP images confirms the above findings CT Brain Perfusion Findings: CBF (<30%) Volume:  63mL Perfusion (Tmax>6.0s) volume: 74mL, this is localized to the falx in likely misregistration artifact. Mismatch Volume: 71mL Infarction Location:Negative. Small focus of mildly decreased cerebral blood flow and increased cerebral blood volume within the left parietal lobe. IMPRESSION: CT head: 1. No acute intracranial abnormality identified. 2. ASPECTS is 10. 3. Multiple stable chronic infarctions, microvascular ischemic changes, and volume loss of the brain. CTA neck: 1. Patent carotid and vertebral arteries. No dissection, aneurysm, or hemodynamically significant stenosis by NASCET criteria. 2. Mild calcific atherosclerosis of the carotid bifurcations in the aorta. CTA head: 1. No new large vessel occlusion, high-grade stenosis, aneurysm, or vascular malformation. 2. Reconstitution of left MCA inferior division M3 branch, persistent stenosis of the branch origin. 3. Intracranial atherosclerosis with multiple segments of mild stenosis in the anterior and posterior circulation. CT perfusion head: 1. No acute  infarction or ischemic penumbra by standard CT perfusion criteria. 2. Small focus of mildly decreased cerebral blood flow and increased cerebral blood volume in the left parietal lobe compatible with oligemia without ischemia. These results were called by telephone at the time of interpretation on 04/26/2018 at 6:30 pm to Dr. Cheral Marker, who verbally acknowledged these results. Electronically Signed   By: Kristine Garbe M.D.   On: 04/26/2018 18:33   Ct Cerebral Perfusion W Contrast  Result Date: 04/26/2018 CLINICAL DATA:  85 y/o F; altered mental status, left-sided gaze, bilateral weakness. EXAM: CT ANGIOGRAPHY HEAD AND NECK CT PERFUSION BRAIN TECHNIQUE: Multidetector CT imaging of the head and neck was performed using the standard protocol during bolus administration of intravenous contrast. Multiplanar CT image reconstructions and MIPs were obtained to evaluate the vascular anatomy. Carotid stenosis measurements (when applicable) are obtained utilizing NASCET criteria, using the distal internal carotid diameter as the denominator. Multiphase CT imaging of the brain was performed following IV bolus contrast injection. Subsequent parametric perfusion maps were calculated using RAPID software. CONTRAST:  157mL ISOVUE-370 IOPAMIDOL (ISOVUE-370) INJECTION 76% COMPARISON:  04/21/2018 in 01/15/2018 CT head. 09/21/2017 CT angiogram neck, CT angiogram head, CT perfusion head. 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 Small chronic infarctions in the cerebellum, right hemi pons, bilateral thalami. Small chronic cortical infarction in left posterior frontal lobe. Nonspecific white matter hypodensities compatible with chronic microvascular ischemic changes and volume loss of the brain are stable. No acute stroke, hemorrhage, mass effect, extra-axial collection, hydrocephalus, or herniation identified. Vascular: As below. Sinuses/Orbits: No acute finding.  Other: Bilateral intra-ocular lens replacement. ASPECTS ([place] Stroke Program Early CT Score) - Ganglionic level infarction (caudate, lentiform nuclei, internal capsule, insula, M1-M3 cortex): 7 - Supraganglionic infarction (M4-M6 cortex): 3 Total score (0-10 with 10 being normal): 10 Review of the MIP images confirms the above findings CTA NECK FINDINGS Aortic arch: Standard branching. Imaged portion shows no evidence of aneurysm or dissection. No significant stenosis of the major arch vessel origins. Mild calcific atherosclerosis. Right carotid system: No evidence of dissection, stenosis (50% or greater) or occlusion. Non stenotic calcified plaque of the carotid bifurcation. Left carotid system: No evidence of dissection, stenosis (50% or greater) or occlusion. Non stenotic calcified plaque of the carotid bifurcation. Vertebral arteries: Left dominant. No evidence of dissection, stenosis (50% or greater) or occlusion. Skeleton: Negative. Other neck: Negative. Upper chest: Negative. Review of the MIP images confirms the above findings CTA HEAD FINDINGS Anterior circulation: No high-grade proximal stenosis, proximal occlusion, aneurysm, or vascular malformation. The left MCA inferior division M3 branch is reconstituted, but with persistent proximal stenosis. Posterior circulation:  No significant stenosis, proximal occlusion, aneurysm, or vascular malformation. There multiple segments of mild stenosis in the anterior and posterior intracranial circulation compatible with atherosclerosis, similar to prior CTA of the head. Venous sinuses: As permitted by contrast timing, patent. Anatomic variants: None significant. Delayed phase: No abnormal intracranial enhancement. Review of the MIP images confirms the above findings CT Brain Perfusion Findings: CBF (<30%) Volume: 69mL Perfusion (Tmax>6.0s) volume: 7mL, this is localized to the falx in likely misregistration artifact. Mismatch Volume: 51mL Infarction  Location:Negative. Small focus of mildly decreased cerebral blood flow and increased cerebral blood volume within the left parietal lobe. IMPRESSION: CT head: 1. No acute intracranial abnormality identified. 2. ASPECTS is 10. 3. Multiple stable chronic infarctions, microvascular ischemic changes, and volume loss of the brain. CTA neck: 1. Patent carotid and vertebral arteries. No dissection, aneurysm, or hemodynamically significant stenosis by NASCET criteria. 2. Mild calcific atherosclerosis of the carotid bifurcations in the aorta. CTA head: 1. No new large vessel occlusion, high-grade stenosis, aneurysm, or vascular malformation. 2. Reconstitution of left MCA inferior division M3 branch, persistent stenosis of the branch origin. 3. Intracranial atherosclerosis with multiple segments of mild stenosis in the anterior and posterior circulation. CT perfusion head: 1. No acute infarction or ischemic penumbra by standard CT perfusion criteria. 2. Small focus of mildly decreased cerebral blood flow and increased cerebral blood volume in the left parietal lobe compatible with oligemia without ischemia. These results were called by telephone at the time of interpretation on 04/26/2018 at 6:30 pm to Dr. Cheral Marker, who verbally acknowledged these results. Electronically Signed   By: Kristine Garbe M.D.   On: 04/26/2018 18:33   Dg Chest Portable 1 View  Result Date: 04/26/2018 CLINICAL DATA:  85 year old female with altered mental status. EXAM: PORTABLE CHEST 1 VIEW COMPARISON:  Portable chest 04/21/2018 and earlier. FINDINGS: Portable AP semi upright view at 1930 hours. The patient is rotated to the left. Cardiomegaly and mediastinal contours are stable. Stable left chest cardiac event recorder. Allowing for portable technique the lungs are clear. No pneumothorax. Paucity of bowel gas in the upper abdomen. Stable cholecystectomy clips. IMPRESSION: No acute cardiopulmonary abnormality. Electronically Signed   By: Genevie Ann M.D.   On: 04/26/2018 19:51   Ct Head Code Stroke Wo Contrast  Result Date: 04/26/2018 CLINICAL DATA:  85 y/o F; altered mental status, left-sided gaze, bilateral weakness. EXAM: CT ANGIOGRAPHY HEAD AND NECK CT PERFUSION BRAIN TECHNIQUE: Multidetector CT imaging of the head and neck was performed using the standard protocol during bolus administration of intravenous contrast. Multiplanar CT image reconstructions and MIPs were obtained to evaluate the vascular anatomy. Carotid stenosis measurements (when applicable) are obtained utilizing NASCET criteria, using the distal internal carotid diameter as the denominator. Multiphase CT imaging of the brain was performed following IV bolus contrast injection. Subsequent parametric perfusion maps were calculated using RAPID software. CONTRAST:  141mL ISOVUE-370 IOPAMIDOL (ISOVUE-370) INJECTION 76% COMPARISON:  04/21/2018 in 01/15/2018 CT head. 09/21/2017 CT angiogram neck, CT angiogram head, CT perfusion head. 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 Small chronic infarctions in the cerebellum, right hemi pons, bilateral thalami. Small chronic cortical infarction in left posterior frontal lobe. Nonspecific white matter hypodensities compatible with chronic microvascular ischemic changes and volume loss of the brain are stable. No acute stroke, hemorrhage, mass effect, extra-axial collection, hydrocephalus, or herniation identified. Vascular: As below. Sinuses/Orbits: No acute finding. Other: Bilateral intra-ocular lens replacement. ASPECTS [facility] Stroke Program Early CT Score) -  Ganglionic level infarction (caudate, lentiform nuclei, internal capsule, insula, M1-M3 cortex): 7 - Supraganglionic infarction (M4-M6 cortex): 3 Total score (0-10 with 10 being normal): 10 Review of the MIP images confirms the above findings CTA NECK FINDINGS Aortic arch: Standard branching. Imaged portion shows no evidence of  aneurysm or dissection. No significant stenosis of the major arch vessel origins. Mild calcific atherosclerosis. Right carotid system: No evidence of dissection, stenosis (50% or greater) or occlusion. Non stenotic calcified plaque of the carotid bifurcation. Left carotid system: No evidence of dissection, stenosis (50% or greater) or occlusion. Non stenotic calcified plaque of the carotid bifurcation. Vertebral arteries: Left dominant. No evidence of dissection, stenosis (50% or greater) or occlusion. Skeleton: Negative. Other neck: Negative. Upper chest: Negative. Review of the MIP images confirms the above findings CTA HEAD FINDINGS Anterior circulation: No high-grade proximal stenosis, proximal occlusion, aneurysm, or vascular malformation. The left MCA inferior division M3 branch is reconstituted, but with persistent proximal stenosis. Posterior circulation: No significant stenosis, proximal occlusion, aneurysm, or vascular malformation. There multiple segments of mild stenosis in the anterior and posterior intracranial circulation compatible with atherosclerosis, similar to prior CTA of the head. Venous sinuses: As permitted by contrast timing, patent. Anatomic variants: None significant. Delayed phase: No abnormal intracranial enhancement. Review of the MIP images confirms the above findings CT Brain Perfusion Findings: CBF (<30%) Volume: 56mL Perfusion (Tmax>6.0s) volume: 13mL, this is localized to the falx in likely misregistration artifact. Mismatch Volume: 20mL Infarction Location:Negative. Small focus of mildly decreased cerebral blood flow and increased cerebral blood volume within the left parietal lobe. IMPRESSION: CT head: 1. No acute intracranial abnormality identified. 2. ASPECTS is 10. 3. Multiple stable chronic infarctions, microvascular ischemic changes, and volume loss of the brain. CTA neck: 1. Patent carotid and vertebral arteries. No dissection, aneurysm, or hemodynamically significant stenosis  by NASCET criteria. 2. Mild calcific atherosclerosis of the carotid bifurcations in the aorta. CTA head: 1. No new large vessel occlusion, high-grade stenosis, aneurysm, or vascular malformation. 2. Reconstitution of left MCA inferior division M3 branch, persistent stenosis of the branch origin. 3. Intracranial atherosclerosis with multiple segments of mild stenosis in the anterior and posterior circulation. CT perfusion head: 1. No acute infarction or ischemic penumbra by standard CT perfusion criteria. 2. Small focus of mildly decreased cerebral blood flow and increased cerebral blood volume in the left parietal lobe compatible with oligemia without ischemia. These results were called by telephone at the time of interpretation on 04/26/2018 at 6:30 pm to Dr. Cheral Marker, who verbally acknowledged these results. Electronically Signed   By: Kristine Garbe M.D.   On: 04/26/2018 18:33    EKG: Independently reviewed.  Assessment/Plan Principal Problem:   Non-convulsive status epilepticus [facility]) Active Problems:   History of Rt brain stroke Oct 2015   Essential hypertension   Blind left eye- (Fuch's disease)   PAF (paroxysmal atrial fibrillation) (HCC)   Vascular dementia with behavior disturbance (HCC)    1. Status epilepticus - 1. Patient is DNR/DNI, confirmed with family 2. Trying IV depakote load 3. Tele monitor 4. Cont EEG monitoring 5. If persists tomorrow then may end up going more towards the palliative care route 6. Concern for aspiration even if she does improve overnight. 7. See neurology consult and progress notes 2. HTN - 1. PRN labetalol ordered for BPs 3. PAF - currently NSR, having to hold  4. Vascular dementia - 1. Fairly advanced at baseline it seems 2. Having to hold PO meds  DVT prophylaxis: Lovenox  Code Status: DNR/DNI Family Communication: Family at bedside Disposition Plan: TBD Consults called: Neuro Admission status: Admit to inpatient  Severity of  Illness: The appropriate patient status for this patient is INPATIENT. Inpatient status is judged to be reasonable and necessary in order to provide the required intensity of service to ensure the patient's safety. The patient's presenting symptoms, physical exam findings, and initial radiographic and laboratory data in the context of their chronic comorbidities is felt to place them at high risk for further clinical deterioration. Furthermore, it is not anticipated that the patient will be medically stable for discharge from the hospital within 2 midnights of admission. The following factors support the patient status of inpatient.   " The patient's presenting symptoms include Seizures. " The worrisome physical exam findings include recurrent seizures, AMS, not protecting airway. " The initial radiographic and laboratory data are worrisome because of NCSE with episodes of convulsions. " The chronic co-morbidities include advanced vascular dementia.   * I certify that at the point of admission it is my clinical judgment that the patient will require inpatient hospital care spanning beyond 2 midnights from the point of admission due to high intensity of service, high risk for further deterioration and high frequency of surveillance required.Etta Quill DO [facility] Pager [phone] Only works nights!  If 7AM-7PM, please contact the primary day team physician taking care of patient  [email or web address] Password [facility]  04/26/2018, 8:58 PM

## 2018-04-26 NOTE — ED Notes (Signed)
OPA removed by MD Pickering. Per Neurologist, bilateral NPAs to remain in place. Pt's family at bedside

## 2018-04-27 ENCOUNTER — Other Ambulatory Visit: Payer: Self-pay

## 2018-04-27 ENCOUNTER — Inpatient Hospital Stay (HOSPITAL_COMMUNITY): Payer: PPO

## 2018-04-27 LAB — CBC WITH DIFFERENTIAL/PLATELET
Abs Immature Granulocytes: 0.06 10*3/uL (ref 0.00–0.07)
BASOS ABS: 0 10*3/uL (ref 0.0–0.1)
Basophils Relative: 0 %
Eosinophils Absolute: 0 10*3/uL (ref 0.0–0.5)
Eosinophils Relative: 0 %
HCT: 41 % (ref 36.0–46.0)
Hemoglobin: 13 g/dL (ref 12.0–15.0)
Immature Granulocytes: 0 %
LYMPHS PCT: 6 %
Lymphs Abs: 0.9 10*3/uL (ref 0.7–4.0)
MCH: 29.6 pg (ref 26.0–34.0)
MCHC: 31.7 g/dL (ref 30.0–36.0)
MCV: 93.4 fL (ref 80.0–100.0)
Monocytes Absolute: 0.7 10*3/uL (ref 0.1–1.0)
Monocytes Relative: 5 %
NEUTROS ABS: 11.9 10*3/uL — AB (ref 1.7–7.7)
Neutrophils Relative %: 89 %
Platelets: 194 10*3/uL (ref 150–400)
RBC: 4.39 MIL/uL (ref 3.87–5.11)
RDW: 13.2 % (ref 11.5–15.5)
WBC: 13.6 10*3/uL — ABNORMAL HIGH (ref 4.0–10.5)
nRBC: 0 % (ref 0.0–0.2)

## 2018-04-27 LAB — BASIC METABOLIC PANEL
ANION GAP: 11 (ref 5–15)
BUN: 9 mg/dL (ref 8–23)
CALCIUM: 8.6 mg/dL — AB (ref 8.9–10.3)
CO2: 25 mmol/L (ref 22–32)
Chloride: 100 mmol/L (ref 98–111)
Creatinine, Ser: 0.83 mg/dL (ref 0.44–1.00)
Glucose, Bld: 136 mg/dL — ABNORMAL HIGH (ref 70–99)
Potassium: 4.1 mmol/L (ref 3.5–5.1)
Sodium: 136 mmol/L (ref 135–145)

## 2018-04-27 LAB — LACTIC ACID, PLASMA
LACTIC ACID, VENOUS: 1.5 mmol/L (ref 0.5–1.9)
Lactic Acid, Venous: 3.1 mmol/L (ref 0.5–1.9)

## 2018-04-27 LAB — MRSA PCR SCREENING: MRSA by PCR: NEGATIVE

## 2018-04-27 MED ORDER — ACETAMINOPHEN 650 MG RE SUPP
325.0000 mg | Freq: Once | RECTAL | Status: AC
  Administered 2018-04-27: 325 mg via RECTAL
  Filled 2018-04-27: qty 1

## 2018-04-27 MED ORDER — ACETAMINOPHEN 325 MG PO TABS: 325.0000 mg | ORAL_TABLET | Freq: Once | ORAL | Status: DC

## 2018-04-27 MED ORDER — METOPROLOL TARTRATE 5 MG/5ML IV SOLN: 5.0000 mg | INTRAVENOUS | Status: DC | PRN

## 2018-04-27 MED ORDER — VANCOMYCIN HCL 10 G IV SOLR
1250.0000 mg | Freq: Once | INTRAVENOUS | Status: AC
  Administered 2018-04-27: 1250 mg via INTRAVENOUS
  Filled 2018-04-27: qty 1250

## 2018-04-27 MED ORDER — METOPROLOL TARTRATE 5 MG/5ML IV SOLN
5.0000 mg | INTRAVENOUS | Status: DC | PRN
  Administered 2018-04-28 – 2018-05-06 (×6): 5 mg via INTRAVENOUS
  Filled 2018-04-27 (×8): qty 5

## 2018-04-27 MED ORDER — SODIUM CHLORIDE 0.9 % IV BOLUS
500.0000 mL | Freq: Once | INTRAVENOUS | Status: AC
  Administered 2018-04-27: 500 mL via INTRAVENOUS

## 2018-04-27 MED ORDER — SODIUM CHLORIDE 0.9 % IV SOLN
1.0000 g | Freq: Two times a day (BID) | INTRAVENOUS | Status: DC
  Filled 2018-04-27: qty 1

## 2018-04-27 MED ORDER — VANCOMYCIN HCL 500 MG IV SOLR
500.0000 mg | INTRAVENOUS | Status: DC
  Filled 2018-04-27: qty 500

## 2018-04-27 MED ORDER — SODIUM CHLORIDE 0.9 % IV SOLN
INTRAVENOUS | Status: DC
  Administered 2018-04-27 – 2018-04-30 (×4): via INTRAVENOUS

## 2018-04-27 MED ORDER — PIPERACILLIN-TAZOBACTAM 3.375 G IVPB
3.3750 g | Freq: Three times a day (TID) | INTRAVENOUS | Status: DC
  Administered 2018-04-27 – 2018-05-01 (×13): 3.375 g via INTRAVENOUS
  Filled 2018-04-27 (×15): qty 50

## 2018-04-27 NOTE — Progress Notes (Signed)
I&O cath returned 600 ml

## 2018-04-27 NOTE — Progress Notes (Signed)
MD aware of vital signs and Mews score, no new orders, family at bed side

## 2018-04-27 NOTE — Progress Notes (Signed)
CRITICAL VALUE ALERT  Critical Value: Lactic acid = 3.1  Date & Time Notied: 04/25/2018, 2446  Provider Notified: Tylene Fantasia  Orders Received/Actions taken

## 2018-04-27 NOTE — Progress Notes (Signed)
PROGRESS NOTE    [name] [name]   ZOX:[id number]  DOB: Jan 22, 1925  DOA: 04/26/2018 PCP: Darcus Austin, MD (Inactive)   Brief Narrative:  [name] [name]  is a 85 y.o. female with medical history significant of PAF, stroke, vascular dementia, blind in L eye, minimal vision in R, HTN who presented from home for a seizure (tonic clonic) noted to have another seizure by EMS and given Versed. She continued to have seizures with eyes being deviated to the left and was given Vimpat. Had another tonic clonic seizure and then given Depakote load after which her seizures resolved. She was noted to be subsequently lethargic and hypoxic. According to her daughter and her aid she had notable aspiration that morning at home when drinking water. She also has dementia and had been awake for about 2 days prior to this.   She was admitted from 04/21/18-04/22/18 for A-fib with RVR and was started on Cardizem.   Subjective: Lethargic.     Assessment & Plan:   Principal Problem:   Non-convulsive status epilepticus  - CT head > Multiple stable chronic infarctions, microvascular ischemic changes, and volume loss of the brain. - CTA head/neck- no significant new stenosis - resolved after Depakote load- neuro following- on Vimpat Q 12 hrs - no prior h/ o seizure but has had a CVA and was not sleeping lately- may have also aspirated at home causing further stress  Active Problems: Lethargy - likely post ictal- may also be due to sedative effects of AED - follow - start IVF while lethargic  Acute hypoxic respiratory failure, low grade fever 100.4, leukocytosis, hypotension (88/44) > sepsis - noted aspiration at home when she coughed multiple times on water- may have also aspirated during seizures - left perihilar opacity noted on CXR - cont zosyn- MRSA PCR neg- d/c Vancomycin - blood cultures pending - have discussed with daughter that the next 24-48 hrs will be critical to see if she turns around- have  discussed that I will also call palliative care  Lactic acidosis - due to seizures and hypoxia- improving    History of Rt brain stroke Oct 2015     PAF (paroxysmal atrial fibrillation)   - hold Cardizem as lethargic- start IV Metoprolol PRN - takes Plavix only for prior CVA and is not on other anticoagulation    Blind left eye- (Fuch's disease)    Vascular dementia   - holding Aricept, Zoloft, Risperdal, Clonopin and Trazodone for now   Time spent in minutes: 35 min DVT prophylaxis: Lovenox Code Status: DNR Family Communication: daughter and son in law Disposition Plan: cont to follow for improvement- will ask for a palliative care consult in case she does not improve Consultants:   neuro Procedures:   EEG Antimicrobials:  Anti-infectives (From admission, onward)   Start     Dose/Rate Route Frequency Ordered Stop   04/28/18 0600  vancomycin (VANCOCIN) 500 mg in sodium chloride 0.9 % 100 mL IVPB     500 mg 100 mL/hr over 60 Minutes Intravenous Every 24 hours 04/27/18 0548     04/27/18 0600  vancomycin (VANCOCIN) 1,250 mg in sodium chloride 0.9 % 250 mL IVPB     1,250 mg 166.7 mL/hr over 90 Minutes Intravenous  Once 04/27/18 0548 04/27/18 0859   04/27/18 0600  ceFEPIme (MAXIPIME) 1 g in sodium chloride 0.9 % 100 mL IVPB  Status:  Discontinued     1 g 200 mL/hr over 30 Minutes Intravenous Every 12 hours 04/27/18 0548 04/27/18  0554   04/27/18 0600  piperacillin-tazobactam (ZOSYN) IVPB 3.375 g     3.375 g 12.5 mL/hr over 240 Minutes Intravenous Every 8 hours 04/27/18 0554         Objective: Vitals:   04/27/18 0816 04/27/18 1131 04/27/18 1337 04/27/18 1625  BP: (!) 88/44  (!) 106/57 (!) 128/56  Pulse:   75 91  Resp:   (!) 26 (!) 21  Temp:  100.2 F (37.9 C) 98.5 F (36.9 C) 98.8 F (37.1 C)  TempSrc:   Axillary Axillary  SpO2:   99% 97%  Weight:        Intake/Output Summary (Last 24 hours) at 04/27/2018 1719 Last data filed at 04/27/2018 1500 Gross per 24 hour    Intake 1750.09 ml  Output 600 ml  Net 1150.09 ml   Filed Weights   04/26/18 1700 04/26/18 1835  Weight: 78.2 kg 78.2 kg    Examination: General exam: lethargic, responds to pain HEENT: opacity on left eye- right eye pupil reactive- no scleral icterus  Respiratory system: Clear to auscultation. Respiratory effort normal. Cardiovascular system: S1 & S2 heard, RRR.   Gastrointestinal system: Abdomen soft, non-tender, nondistended. Normal bowel sounds. Central nervous system: lethargic- extremities flaccid Extremities: No cyanosis, clubbing or edema Skin: No rashes or ulcers Psychiatry:  Cannot assess    Data Reviewed: I have personally reviewed following labs and imaging studies  CBC: Recent Labs  Lab 04/21/18 1201 04/22/18 0551 04/26/18 1747 04/27/18 0322  WBC 7.0 6.2 6.3 13.6*  NEUTROABS 4.9  --  4.1 11.9*  HGB 14.5 12.6 13.4 13.0  HCT 45.3 40.6 43.7 41.0  MCV 95.8 94.6 97.5 93.4  PLT 210 184 209 824   Basic Metabolic Panel: Recent Labs  Lab 04/21/18 1201 04/22/18 0551 04/26/18 1747 04/27/18 0322  NA 138 138 138 136  K 4.1 3.9 4.9 4.1  CL 104 103 102 100  CO2 26 27 18* 25  GLUCOSE 139* 109* 161* 136*  BUN 21 19 18 9   CREATININE 0.95 0.76 0.82  0.50 0.83  CALCIUM 8.9 8.7* 9.1 8.6*  MG 2.2  --   --   --    GFR: Estimated Creatinine Clearance: 42.7 mL/min (by C-G formula based on SCr of 0.83 mg/dL). Liver Function Tests: Recent Labs  Lab 04/26/18 1747  AST 47*  ALT 18  ALKPHOS 86  BILITOT 1.5*  PROT 6.3*  ALBUMIN 3.5   No results for input(s): LIPASE, AMYLASE in the last 168 hours. No results for input(s): AMMONIA in the last 168 hours. Coagulation Profile: Recent Labs  Lab 04/26/18 1747  INR 1.08   Cardiac Enzymes: No results for input(s): CKTOTAL, CKMB, CKMBINDEX, TROPONINI in the last 168 hours. BNP (last 3 results) No results for input(s): PROBNP in the last 8760 hours. HbA1C: No results for input(s): HGBA1C in the last 72  hours. CBG: Recent Labs  Lab 04/26/18 1737  GLUCAP 137*   Lipid Profile: No results for input(s): CHOL, HDL, LDLCALC, TRIG, CHOLHDL, LDLDIRECT in the last 72 hours. Thyroid Function Tests: No results for input(s): TSH, T4TOTAL, FREET4, T3FREE, THYROIDAB in the last 72 hours. Anemia Panel: No results for input(s): VITAMINB12, FOLATE, FERRITIN, TIBC, IRON, RETICCTPCT in the last 72 hours. Urine analysis:    Component Value Date/Time   COLORURINE YELLOW 04/21/2018 [place] 04/21/2018 1201   LABSPEC 1.009 04/21/2018 1201   PHURINE 6.0 04/21/2018 1201   GLUCOSEU NEGATIVE 04/21/2018 1201   [place] 04/21/2018 1201  BILIRUBINUR NEGATIVE 04/21/2018 [place] 04/21/2018 [place] 04/21/2018 1201   UROBILINOGEN 0.2 12/19/2014 1830   NITRITE NEGATIVE 04/21/2018 1201   LEUKOCYTESUR MODERATE (A) 04/21/2018 1201   Sepsis Labs: @LABRCNTIP (procalcitonin:4,lacticidven:4) ) Recent Results (from the past 240 hour(s))  Urine culture     Status: Abnormal   Collection Time: 04/21/18  2:00 PM  Result Value Ref Range Status   Specimen Description URINE, CLEAN CATCH  Final   Special Requests   Final    NONE Performed at [facility], [place] [address], [place], [place] [address]    Culture (A)  Final    20,000 COLONIES/mL MULTIPLE SPECIES PRESENT, SUGGEST RECOLLECTION   Report Status 04/22/2018 FINAL  Final  MRSA PCR Screening     Status: None   Collection Time: 04/27/18 10:41 AM  Result Value Ref Range Status   MRSA by PCR NEGATIVE NEGATIVE Final    Comment:        The GeneXpert MRSA Assay (FDA approved for NASAL specimens only), is one component of a comprehensive MRSA colonization surveillance program. It is not intended to diagnose MRSA infection nor to guide or monitor treatment for MRSA infections. Performed at [facility], [place] [address], [place], [place] [address]          Radiology Studies: Ct Angio  Head W Or Wo Contrast  Result Date: 04/26/2018 CLINICAL DATA:  85 y/o F; altered mental status, left-sided gaze, bilateral weakness. EXAM: CT ANGIOGRAPHY HEAD AND NECK CT PERFUSION BRAIN TECHNIQUE: Multidetector CT imaging of the head and neck was performed using the standard protocol during bolus administration of intravenous contrast. Multiplanar CT image reconstructions and MIPs were obtained to evaluate the vascular anatomy. Carotid stenosis measurements (when applicable) are obtained utilizing NASCET criteria, using the distal internal carotid diameter as the denominator. Multiphase CT imaging of the brain was performed following IV bolus contrast injection. Subsequent parametric perfusion maps were calculated using RAPID software. CONTRAST:  145mL ISOVUE-370 IOPAMIDOL (ISOVUE-370) INJECTION 76% COMPARISON:  04/21/2018 in 01/15/2018 CT head. 09/21/2017 CT angiogram neck, CT angiogram head, CT perfusion head. 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 Small chronic infarctions in the cerebellum, right hemi pons, bilateral thalami. Small chronic cortical infarction in left posterior frontal lobe. Nonspecific white matter hypodensities compatible with chronic microvascular ischemic changes and volume loss of the brain are stable. No acute stroke, hemorrhage, mass effect, extra-axial collection, hydrocephalus, or herniation identified. Vascular: As below. Sinuses/Orbits: No acute finding. Other: Bilateral intra-ocular lens replacement. ASPECTS ([place] Stroke Program Early CT Score) - Ganglionic level infarction (caudate, lentiform nuclei, internal capsule, insula, M1-M3 cortex): 7 - Supraganglionic infarction (M4-M6 cortex): 3 Total score (0-10 with 10 being normal): 10 Review of the MIP images confirms the above findings CTA NECK FINDINGS Aortic arch: Standard branching. Imaged portion shows no evidence of aneurysm or dissection. No significant stenosis  of the major arch vessel origins. Mild calcific atherosclerosis. Right carotid system: No evidence of dissection, stenosis (50% or greater) or occlusion. Non stenotic calcified plaque of the carotid bifurcation. Left carotid system: No evidence of dissection, stenosis (50% or greater) or occlusion. Non stenotic calcified plaque of the carotid bifurcation. Vertebral arteries: Left dominant. No evidence of dissection, stenosis (50% or greater) or occlusion. Skeleton: Negative. Other neck: Negative. Upper chest: Negative. Review of the MIP images confirms the above findings CTA HEAD FINDINGS Anterior circulation: No high-grade proximal stenosis, proximal occlusion, aneurysm, or vascular  malformation. The left MCA inferior division M3 branch is reconstituted, but with persistent proximal stenosis. Posterior circulation: No significant stenosis, proximal occlusion, aneurysm, or vascular malformation. There multiple segments of mild stenosis in the anterior and posterior intracranial circulation compatible with atherosclerosis, similar to prior CTA of the head. Venous sinuses: As permitted by contrast timing, patent. Anatomic variants: None significant. Delayed phase: No abnormal intracranial enhancement. Review of the MIP images confirms the above findings CT Brain Perfusion Findings: CBF (<30%) Volume: 70mL Perfusion (Tmax>6.0s) volume: 38mL, this is localized to the falx in likely misregistration artifact. Mismatch Volume: 40mL Infarction Location:Negative. Small focus of mildly decreased cerebral blood flow and increased cerebral blood volume within the left parietal lobe. IMPRESSION: CT head: 1. No acute intracranial abnormality identified. 2. ASPECTS is 10. 3. Multiple stable chronic infarctions, microvascular ischemic changes, and volume loss of the brain. CTA neck: 1. Patent carotid and vertebral arteries. No dissection, aneurysm, or hemodynamically significant stenosis by NASCET criteria. 2. Mild calcific  atherosclerosis of the carotid bifurcations in the aorta. CTA head: 1. No new large vessel occlusion, high-grade stenosis, aneurysm, or vascular malformation. 2. Reconstitution of left MCA inferior division M3 branch, persistent stenosis of the branch origin. 3. Intracranial atherosclerosis with multiple segments of mild stenosis in the anterior and posterior circulation. CT perfusion head: 1. No acute infarction or ischemic penumbra by standard CT perfusion criteria. 2. Small focus of mildly decreased cerebral blood flow and increased cerebral blood volume in the left parietal lobe compatible with oligemia without ischemia. These results were called by telephone at the time of interpretation on 04/26/2018 at 6:30 pm to Dr. Cheral Marker, who verbally acknowledged these results. Electronically Signed   By: Kristine Garbe M.D.   On: 04/26/2018 18:33   Ct Angio Neck W Or Wo Contrast  Result Date: 04/26/2018 CLINICAL DATA:  85 y/o F; altered mental status, left-sided gaze, bilateral weakness. EXAM: CT ANGIOGRAPHY HEAD AND NECK CT PERFUSION BRAIN TECHNIQUE: Multidetector CT imaging of the head and neck was performed using the standard protocol during bolus administration of intravenous contrast. Multiplanar CT image reconstructions and MIPs were obtained to evaluate the vascular anatomy. Carotid stenosis measurements (when applicable) are obtained utilizing NASCET criteria, using the distal internal carotid diameter as the denominator. Multiphase CT imaging of the brain was performed following IV bolus contrast injection. Subsequent parametric perfusion maps were calculated using RAPID software. CONTRAST:  166mL ISOVUE-370 IOPAMIDOL (ISOVUE-370) INJECTION 76% COMPARISON:  04/21/2018 in 01/15/2018 CT head. 09/21/2017 CT angiogram neck, CT angiogram head, CT perfusion head. 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 Small chronic infarctions in the cerebellum,  right hemi pons, bilateral thalami. Small chronic cortical infarction in left posterior frontal lobe. Nonspecific white matter hypodensities compatible with chronic microvascular ischemic changes and volume loss of the brain are stable. No acute stroke, hemorrhage, mass effect, extra-axial collection, hydrocephalus, or herniation identified. Vascular: As below. Sinuses/Orbits: No acute finding. Other: Bilateral intra-ocular lens replacement. ASPECTS ([place] Stroke Program Early CT Score) - Ganglionic level infarction (caudate, lentiform nuclei, internal capsule, insula, M1-M3 cortex): 7 - Supraganglionic infarction (M4-M6 cortex): 3 Total score (0-10 with 10 being normal): 10 Review of the MIP images confirms the above findings CTA NECK FINDINGS Aortic arch: Standard branching. Imaged portion shows no evidence of aneurysm or dissection. No significant stenosis of the major arch vessel origins. Mild calcific atherosclerosis. Right carotid system: No evidence of dissection, stenosis (50% or greater) or occlusion. Non stenotic calcified plaque of the  carotid bifurcation. Left carotid system: No evidence of dissection, stenosis (50% or greater) or occlusion. Non stenotic calcified plaque of the carotid bifurcation. Vertebral arteries: Left dominant. No evidence of dissection, stenosis (50% or greater) or occlusion. Skeleton: Negative. Other neck: Negative. Upper chest: Negative. Review of the MIP images confirms the above findings CTA HEAD FINDINGS Anterior circulation: No high-grade proximal stenosis, proximal occlusion, aneurysm, or vascular malformation. The left MCA inferior division M3 branch is reconstituted, but with persistent proximal stenosis. Posterior circulation: No significant stenosis, proximal occlusion, aneurysm, or vascular malformation. There multiple segments of mild stenosis in the anterior and posterior intracranial circulation compatible with atherosclerosis, similar to prior CTA of the head.  Venous sinuses: As permitted by contrast timing, patent. Anatomic variants: None significant. Delayed phase: No abnormal intracranial enhancement. Review of the MIP images confirms the above findings CT Brain Perfusion Findings: CBF (<30%) Volume: 39mL Perfusion (Tmax>6.0s) volume: 74mL, this is localized to the falx in likely misregistration artifact. Mismatch Volume: 20mL Infarction Location:Negative. Small focus of mildly decreased cerebral blood flow and increased cerebral blood volume within the left parietal lobe. IMPRESSION: CT head: 1. No acute intracranial abnormality identified. 2. ASPECTS is 10. 3. Multiple stable chronic infarctions, microvascular ischemic changes, and volume loss of the brain. CTA neck: 1. Patent carotid and vertebral arteries. No dissection, aneurysm, or hemodynamically significant stenosis by NASCET criteria. 2. Mild calcific atherosclerosis of the carotid bifurcations in the aorta. CTA head: 1. No new large vessel occlusion, high-grade stenosis, aneurysm, or vascular malformation. 2. Reconstitution of left MCA inferior division M3 branch, persistent stenosis of the branch origin. 3. Intracranial atherosclerosis with multiple segments of mild stenosis in the anterior and posterior circulation. CT perfusion head: 1. No acute infarction or ischemic penumbra by standard CT perfusion criteria. 2. Small focus of mildly decreased cerebral blood flow and increased cerebral blood volume in the left parietal lobe compatible with oligemia without ischemia. These results were called by telephone at the time of interpretation on 04/26/2018 at 6:30 pm to Dr. Cheral Marker, who verbally acknowledged these results. Electronically Signed   By: Kristine Garbe M.D.   On: 04/26/2018 18:33   Ct Cerebral Perfusion W Contrast  Result Date: 04/26/2018 CLINICAL DATA:  85 y/o F; altered mental status, left-sided gaze, bilateral weakness. EXAM: CT ANGIOGRAPHY HEAD AND NECK CT PERFUSION BRAIN TECHNIQUE:  Multidetector CT imaging of the head and neck was performed using the standard protocol during bolus administration of intravenous contrast. Multiplanar CT image reconstructions and MIPs were obtained to evaluate the vascular anatomy. Carotid stenosis measurements (when applicable) are obtained utilizing NASCET criteria, using the distal internal carotid diameter as the denominator. Multiphase CT imaging of the brain was performed following IV bolus contrast injection. Subsequent parametric perfusion maps were calculated using RAPID software. CONTRAST:  142mL ISOVUE-370 IOPAMIDOL (ISOVUE-370) INJECTION 76% COMPARISON:  04/21/2018 in 01/15/2018 CT head. 09/21/2017 CT angiogram neck, CT angiogram head, CT perfusion head. 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 Small chronic infarctions in the cerebellum, right hemi pons, bilateral thalami. Small chronic cortical infarction in left posterior frontal lobe. Nonspecific white matter hypodensities compatible with chronic microvascular ischemic changes and volume loss of the brain are stable. No acute stroke, hemorrhage, mass effect, extra-axial collection, hydrocephalus, or herniation identified. Vascular: As below. Sinuses/Orbits: No acute finding. Other: Bilateral intra-ocular lens replacement. ASPECTS [facility] Stroke Program Early CT Score) - Ganglionic level infarction (caudate, lentiform nuclei, internal capsule, insula, M1-M3 cortex): 7 - Supraganglionic infarction (M4-M6  cortex): 3 Total score (0-10 with 10 being normal): 10 Review of the MIP images confirms the above findings CTA NECK FINDINGS Aortic arch: Standard branching. Imaged portion shows no evidence of aneurysm or dissection. No significant stenosis of the major arch vessel origins. Mild calcific atherosclerosis. Right carotid system: No evidence of dissection, stenosis (50% or greater) or occlusion. Non stenotic calcified plaque of the carotid  bifurcation. Left carotid system: No evidence of dissection, stenosis (50% or greater) or occlusion. Non stenotic calcified plaque of the carotid bifurcation. Vertebral arteries: Left dominant. No evidence of dissection, stenosis (50% or greater) or occlusion. Skeleton: Negative. Other neck: Negative. Upper chest: Negative. Review of the MIP images confirms the above findings CTA HEAD FINDINGS Anterior circulation: No high-grade proximal stenosis, proximal occlusion, aneurysm, or vascular malformation. The left MCA inferior division M3 branch is reconstituted, but with persistent proximal stenosis. Posterior circulation: No significant stenosis, proximal occlusion, aneurysm, or vascular malformation. There multiple segments of mild stenosis in the anterior and posterior intracranial circulation compatible with atherosclerosis, similar to prior CTA of the head. Venous sinuses: As permitted by contrast timing, patent. Anatomic variants: None significant. Delayed phase: No abnormal intracranial enhancement. Review of the MIP images confirms the above findings CT Brain Perfusion Findings: CBF (<30%) Volume: 68mL Perfusion (Tmax>6.0s) volume: 2mL, this is localized to the falx in likely misregistration artifact. Mismatch Volume: 59mL Infarction Location:Negative. Small focus of mildly decreased cerebral blood flow and increased cerebral blood volume within the left parietal lobe. IMPRESSION: CT head: 1. No acute intracranial abnormality identified. 2. ASPECTS is 10. 3. Multiple stable chronic infarctions, microvascular ischemic changes, and volume loss of the brain. CTA neck: 1. Patent carotid and vertebral arteries. No dissection, aneurysm, or hemodynamically significant stenosis by NASCET criteria. 2. Mild calcific atherosclerosis of the carotid bifurcations in the aorta. CTA head: 1. No new large vessel occlusion, high-grade stenosis, aneurysm, or vascular malformation. 2. Reconstitution of left MCA inferior division M3  branch, persistent stenosis of the branch origin. 3. Intracranial atherosclerosis with multiple segments of mild stenosis in the anterior and posterior circulation. CT perfusion head: 1. No acute infarction or ischemic penumbra by standard CT perfusion criteria. 2. Small focus of mildly decreased cerebral blood flow and increased cerebral blood volume in the left parietal lobe compatible with oligemia without ischemia. These results were called by telephone at the time of interpretation on 04/26/2018 at 6:30 pm to Dr. Cheral Marker, who verbally acknowledged these results. Electronically Signed   By: Kristine Garbe M.D.   On: 04/26/2018 18:33   Dg Chest Port 1 View  Result Date: 04/27/2018 CLINICAL DATA:  Fever.  Aspiration. EXAM: PORTABLE CHEST 1 VIEW COMPARISON:  04/26/2018 FINDINGS: Cardiomegaly and aortic tortuosity distorted by leftward rotation. There is an implantable loop recorder. Worsening left perihilar opacity but no evident volume loss. No Kerley lines, effusion, or pneumothorax. IMPRESSION: 1. Limited rotated chest. 2. Equivocal for worsening left perihilar opacity which could reflect infection or aspiration in this setting. Electronically Signed   By: Monte Fantasia M.D.   On: 04/27/2018 09:46   Dg Chest Portable 1 View  Result Date: 04/26/2018 CLINICAL DATA:  85 year old female with altered mental status. EXAM: PORTABLE CHEST 1 VIEW COMPARISON:  Portable chest 04/21/2018 and earlier. FINDINGS: Portable AP semi upright view at 1930 hours. The patient is rotated to the left. Cardiomegaly and mediastinal contours are stable. Stable left chest cardiac event recorder. Allowing for portable technique the lungs are clear. No pneumothorax. Paucity of bowel gas in the  upper abdomen. Stable cholecystectomy clips. IMPRESSION: No acute cardiopulmonary abnormality. Electronically Signed   By: Genevie Ann M.D.   On: 04/26/2018 19:51   Ct Head Code Stroke Wo Contrast  Result Date: 04/26/2018 CLINICAL DATA:   85 y/o F; altered mental status, left-sided gaze, bilateral weakness. EXAM: CT ANGIOGRAPHY HEAD AND NECK CT PERFUSION BRAIN TECHNIQUE: Multidetector CT imaging of the head and neck was performed using the standard protocol during bolus administration of intravenous contrast. Multiplanar CT image reconstructions and MIPs were obtained to evaluate the vascular anatomy. Carotid stenosis measurements (when applicable) are obtained utilizing NASCET criteria, using the distal internal carotid diameter as the denominator. Multiphase CT imaging of the brain was performed following IV bolus contrast injection. Subsequent parametric perfusion maps were calculated using RAPID software. CONTRAST:  15mL ISOVUE-370 IOPAMIDOL (ISOVUE-370) INJECTION 76% COMPARISON:  04/21/2018 in 01/15/2018 CT head. 09/21/2017 CT angiogram neck, CT angiogram head, CT perfusion head. 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 Small chronic infarctions in the cerebellum, right hemi pons, bilateral thalami. Small chronic cortical infarction in left posterior frontal lobe. Nonspecific white matter hypodensities compatible with chronic microvascular ischemic changes and volume loss of the brain are stable. No acute stroke, hemorrhage, mass effect, extra-axial collection, hydrocephalus, or herniation identified. Vascular: As below. Sinuses/Orbits: No acute finding. Other: Bilateral intra-ocular lens replacement. ASPECTS ([place] Stroke Program Early CT Score) - Ganglionic level infarction (caudate, lentiform nuclei, internal capsule, insula, M1-M3 cortex): 7 - Supraganglionic infarction (M4-M6 cortex): 3 Total score (0-10 with 10 being normal): 10 Review of the MIP images confirms the above findings CTA NECK FINDINGS Aortic arch: Standard branching. Imaged portion shows no evidence of aneurysm or dissection. No significant stenosis of the major arch vessel origins. Mild calcific atherosclerosis.  Right carotid system: No evidence of dissection, stenosis (50% or greater) or occlusion. Non stenotic calcified plaque of the carotid bifurcation. Left carotid system: No evidence of dissection, stenosis (50% or greater) or occlusion. Non stenotic calcified plaque of the carotid bifurcation. Vertebral arteries: Left dominant. No evidence of dissection, stenosis (50% or greater) or occlusion. Skeleton: Negative. Other neck: Negative. Upper chest: Negative. Review of the MIP images confirms the above findings CTA HEAD FINDINGS Anterior circulation: No high-grade proximal stenosis, proximal occlusion, aneurysm, or vascular malformation. The left MCA inferior division M3 branch is reconstituted, but with persistent proximal stenosis. Posterior circulation: No significant stenosis, proximal occlusion, aneurysm, or vascular malformation. There multiple segments of mild stenosis in the anterior and posterior intracranial circulation compatible with atherosclerosis, similar to prior CTA of the head. Venous sinuses: As permitted by contrast timing, patent. Anatomic variants: None significant. Delayed phase: No abnormal intracranial enhancement. Review of the MIP images confirms the above findings CT Brain Perfusion Findings: CBF (<30%) Volume: 62mL Perfusion (Tmax>6.0s) volume: 14mL, this is localized to the falx in likely misregistration artifact. Mismatch Volume: 30mL Infarction Location:Negative. Small focus of mildly decreased cerebral blood flow and increased cerebral blood volume within the left parietal lobe. IMPRESSION: CT head: 1. No acute intracranial abnormality identified. 2. ASPECTS is 10. 3. Multiple stable chronic infarctions, microvascular ischemic changes, and volume loss of the brain. CTA neck: 1. Patent carotid and vertebral arteries. No dissection, aneurysm, or hemodynamically significant stenosis by NASCET criteria. 2. Mild calcific atherosclerosis of the carotid bifurcations in the aorta. CTA head: 1. No  new large vessel occlusion, high-grade stenosis, aneurysm, or vascular malformation. 2. Reconstitution of left MCA inferior division M3 branch, persistent stenosis of the branch origin.  3. Intracranial atherosclerosis with multiple segments of mild stenosis in the anterior and posterior circulation. CT perfusion head: 1. No acute infarction or ischemic penumbra by standard CT perfusion criteria. 2. Small focus of mildly decreased cerebral blood flow and increased cerebral blood volume in the left parietal lobe compatible with oligemia without ischemia. These results were called by telephone at the time of interpretation on 04/26/2018 at 6:30 pm to Dr. Cheral Marker, who verbally acknowledged these results. Electronically Signed   By: Kristine Garbe M.D.   On: 04/26/2018 18:33      Scheduled Meds: . enoxaparin (LOVENOX) injection  40 mg Subcutaneous QHS  . prednisoLONE acetate  1 drop Both Eyes QHS   Continuous Infusions: . sodium chloride 75 mL/hr at 04/27/18 1220  . lacosamide (VIMPAT) IV 100 mg (04/27/18 0949)  . piperacillin-tazobactam (ZOSYN)  IV 3.375 g (04/27/18 1125)  . valproate sodium 500 mg (04/27/18 0625)  . [START ON 04/28/2018] vancomycin       LOS: 1 day      Debbe Odea, MD [facility] Pager: [email or web address] Password [facility] 04/27/2018, 5:19 PM

## 2018-04-27 NOTE — Progress Notes (Signed)
LTM EEG checked. No skin breakdown noted 

## 2018-04-27 NOTE — Procedures (Signed)
LTM-EEG Report  HISTORY: Continuous video-EEG monitoring performed for 85 year old with focal NCSE. ACQUISITION: International 10-20 system for electrode placement; 18 channels with additional eyes linked to ipsilateral ears and EKG. Additional T1-T2 electrodes were used. Continuous video recording obtained.   EEG NUMBER:  MEDICATIONS:  Day 1: see EMR  DAY #1: from 1726 04/26/18 to 0730 04/27/18  BACKGROUND: An overall medium voltage continuous recording with some spontaneous variability and reactivity. The record consisted initially of a focal status epilepticus pattern, described below. After 2300, there was resolution of ictal discharges with emergence of a medium voltage 3-6hz  background with some superimposed faster frequencies. There was no evidence of a posterior basic rhythm. State changes were appreciated, however no clear sleep architecture was seen. EPILEPTIFORM/PERIODIC ACTIVITY: There were initially continuous left occipital spikes and rapid polyspikes with some ictal evolution. These resolved after 2300. SEIZURES: There was continuous focal ictal spikes in the left occipital region with some spread bilaterally. Contralateral spread was associated with convulsive seizures with limb and head rhythmic jerking. These seizures resolved with IV AED loading after 2300. EVENTS: Convulsive seizures as above  EKG: no significant arrhythmia  SUMMARY: This was a markedly abnormal continuous video EEG due to a focal non-convulsive status epilepticus pattern in the left occipital region with some brief clinical seizures. These resolved with IV AED loading after 2300 with no further seizures or epileptiform discharges observed. There was diffuse, left maximal slowing overnight.

## 2018-04-27 NOTE — Progress Notes (Addendum)
NEUROLOGY PROGRESS NOTE  Subjective: Patient at this time is extremely lethargic and does not have any verbalizations; however, does localize to pain and withdraw to pain.  LTM continuing to run but shows no epileptiform activity  Exam: Vitals:   04/27/18 1131 04/27/18 1337  BP:  (!) 106/57  Pulse:  75  Resp:  (!) 26  Temp: 100.2 F (37.9 C) 98.5 F (36.9 C)  SpO2:  99%    Physical Exam   HEENT-  Normocephalic, no lesions, without obvious abnormality.  Normal external eye and conjunctiva.  Extremities- Warm, dry and intact Musculoskeletal-no joint tenderness, deformity or swelling Skin-warm and dry, no hyperpigmentation, vitiligo, or suspicious lesions    Neuro:  Mental Status: Continues to be postictal.  Does not open eyes to sternal rub but does localize to sternal rub with bilateral upper extremities.  Does withdraw briskly from noxious stimuli in lower extremities. 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 enoxaparin (LOVENOX) injection  40 mg Subcutaneous QHS  . prednisoLONE acetate  1 drop Both Eyes QHS   Continuous: . sodium chloride 75 mL/hr at 04/27/18 1220  . lacosamide (VIMPAT) IV 100 mg (04/27/18 0949)  . piperacillin-tazobactam (ZOSYN)  IV 3.375 g (04/27/18 1125)  . valproate sodium 500 mg (04/27/18 0625)  . [START ON 04/28/2018] vancomycin     OMA:[id number] **OR** acetaminophen, LORazepam, metoprolol tartrate, ondansetron **OR** ondansetron (ZOFRAN) IV  Pertinent Labs/Diagnostics:  LTM: SUMMARY: This was a markedly abnormal  continuous video EEG due to a focal non-convulsive status epilepticus pattern in the left occipital region with some brief clinical seizures. These resolved with IV AED loading after 2300 with no further seizures or epileptiform discharges observed. There was diffuse, left maximal slowing overnight.   Etta Quill PA-C [facility] [phone]   Assessment: 85 year old female brought to hospital secondary to status epilepticus.   1. Initial EEG did show epileptiform activity however LTM overnight did not show any epileptiform activity and seizures have resolved with Vimpat and valproic acid.   2. Currently postictal   Recommendations: - Continue LTM overnight - Continue Vimpat 100 mg BID and valproic acid 500 mg TID - Continue seizure precautions   Electronically signed: Dr. Kerney Elbe 04/27/2018, 4:18 PM

## 2018-04-27 NOTE — Progress Notes (Addendum)
Pharmacy Antibiotic Note  [name] [name] is a 85 y.o. female admitted on 04/26/2018 with seizures, now with fevers and possible sepsis.  Pharmacy has been consulted for Vancomycin and Zosyn  dosing.  Plan: Vancomycin 1250 mg IV now, then 500 mg IV q24h Zosyn 3.375 g IV q8h   Weight: 172 lb 6.4 oz (78.2 kg)  Temp (24hrs), Avg:100.5 F (38.1 C), Min:98.9 F (37.2 C), Max:101.2 F (38.4 C)  Recent Labs  Lab 04/21/18 1201 04/22/18 0551 04/26/18 1747 04/26/18 1911 04/26/18 2256 04/27/18 0322 04/27/18 0328  WBC 7.0 6.2 6.3  --   --  13.6*  --   CREATININE 0.95 0.76 0.82  0.50  --   --  0.83  --   LATICACIDVEN  --   --   --  3.5* 4.3*  --  3.1*    Estimated Creatinine Clearance: 42.7 mL/min (by C-G formula based on SCr of 0.83 mg/dL).    Allergies  Allergen Reactions  . Fluorescein Anaphylaxis, Shortness Of Breath, Itching and Swelling  . Hydralazine Hcl Anaphylaxis, Swelling, Palpitations and Rash  . Sulfa Antibiotics Rash and Other (See Comments)    Other Reaction: red bumps  . Ultram [Tramadol] Hives  . Yellow Dye Anaphylaxis, Itching, Swelling and Other (See Comments)    Fluorescein (in eye drops)  . Buprenex [Buprenorphine Hcl] Palpitations and Other (See Comments)  . Keflex [Cephalexin] Diarrhea  . Lipitor [Atorvastatin] Other (See Comments)    Muscle weakness  . Restasis [Cyclosporine] Swelling    redness of face with slight swelling   . Suprax [Cefixime] Diarrhea  . Augmentin [Amoxicillin-Pot Clavulanate] Nausea And Vomiting  . Levofloxacin Other (See Comments)    Reaction not recalled  . Morphine And Related Other (See Comments)    agitation   . Percocet [Oxycodone-Acetaminophen] Nausea And Vomiting  . Tape Other (See Comments)    Adhesive tape pulls off the skin Able to tolerate paper tape  . Zetia [Ezetimibe] Other (See Comments)    Myalgias    Caryl Pina 04/27/2018 5:44 AM

## 2018-04-28 DIAGNOSIS — R4182 Altered mental status, unspecified: Secondary | ICD-10-CM

## 2018-04-28 LAB — BASIC METABOLIC PANEL
Anion gap: 11 (ref 5–15)
BUN: 15 mg/dL (ref 8–23)
CO2: 25 mmol/L (ref 22–32)
Calcium: 8.6 mg/dL — ABNORMAL LOW (ref 8.9–10.3)
Chloride: 104 mmol/L (ref 98–111)
Creatinine, Ser: 0.7 mg/dL (ref 0.44–1.00)
GFR calc Af Amer: >60 mL/min (ref >60)
GFR calc non Af Amer: >60 mL/min (ref >60)
Glucose, Bld: 72 mg/dL (ref 70–99)
Potassium: 3.6 mmol/L (ref 3.5–5.1)
SODIUM: 140 mmol/L (ref 135–145)

## 2018-04-28 LAB — CBC
HCT: 40.6 % (ref 36.0–46.0)
Hemoglobin: 12.9 g/dL (ref 12.0–15.0)
MCH: 30.4 pg (ref 26.0–34.0)
MCHC: 31.8 g/dL (ref 30.0–36.0)
MCV: 95.5 fL (ref 80.0–100.0)
Platelets: 163 10*3/uL (ref 150–400)
RBC: 4.25 MIL/uL (ref 3.87–5.11)
RDW: 13.4 % (ref 11.5–15.5)
WBC: 7.8 10*3/uL (ref 4.0–10.5)
nRBC: 0 % (ref 0.0–0.2)

## 2018-04-28 MED ORDER — DILTIAZEM HCL-DEXTROSE 100-5 MG/100ML-% IV SOLN (PREMIX)
5.0000 mg/h | INTRAVENOUS | Status: DC
  Administered 2018-04-28 (×2): 15 mg/h via INTRAVENOUS
  Administered 2018-04-28: 5 mg/h via INTRAVENOUS
  Administered 2018-04-29: 15 mg/h via INTRAVENOUS
  Administered 2018-04-29: 5 mg/h via INTRAVENOUS
  Administered 2018-04-30: 15 mg/h via INTRAVENOUS
  Administered 2018-04-30: 12.5 mg/h via INTRAVENOUS
  Administered 2018-04-30: 15 mg/h via INTRAVENOUS
  Administered 2018-04-30: 12.5 mg/h via INTRAVENOUS
  Administered 2018-04-30: 15 mg/h via INTRAVENOUS
  Administered 2018-05-01: 12.5 mg/h via INTRAVENOUS
  Administered 2018-05-01: 15 mg/h via INTRAVENOUS
  Filled 2018-04-28 (×12): qty 100

## 2018-04-28 NOTE — Progress Notes (Signed)
EEG LTM complete no skin breakdown.

## 2018-04-28 NOTE — Procedures (Signed)
LTM-EEG Report  HISTORY: Continuous video-EEG monitoring performed for 85 year old with focal NCSE. ACQUISITION: International 10-20 system for electrode placement; 18 channels with additional eyes linked to ipsilateral ears and EKG. Additional T1-T2 electrodes were used. Continuous video recording obtained.   EEG NUMBER:  MEDICATIONS:  Day 2: see EMR  DAY #2: from 0730 04/27/18 to 0730 04/28/18  BACKGROUND: An overall medium voltage continuous recording with some spontaneous variability and reactivity. The record consisted of a medium voltage 4-7hz  background with some superimposed faster frequencies. There was no evidence of a posterior basic rhythm. State changes were appreciated with decreased sleep architecture over the left.  EPILEPTIFORM/PERIODIC ACTIVITY: There were occasional brief runs of GRDA without epileptiform appearance.  SEIZURES: none EVENTS: none  EKG: no significant arrhythmia  SUMMARY: This was a moderately abnormal continuous video EEG due to diffuse, left maximal slowing and some runs of GRDA. This was indicative of a diffuse or multifocal cerebral disturbance. No further seizures were seen.

## 2018-04-28 NOTE — Progress Notes (Signed)
NEUROLOGY PROGRESS NOTE  Subjective: Per family members patient has improved slightly.  Apparently this morning she did state her son's name and recognize her daughter.  With noxious stimuli she did tell me to stop, and stated that hurts.  With plantar stimuli she continued to state stop that hurts which was more than she was able to say yesterday  Exam: Vitals:   04/28/18 0906 04/28/18 0915  BP: 106/66 125/84  Pulse: (!) 116 (!) 115  Resp: (!) 21 (!) 21  Temp:    SpO2: 91% 94%    Physical Exam  HEENT-  Normocephalic, no lesions, without obvious abnormality.  Normal external eye and conjunctiva.  Cardiovascular- S1-S2, currently tachycardic Extremities- Warm, dry and intact Musculoskeletal-no joint tenderness, deformity or swelling Skin-warm and dry, no hyperpigmentation, vitiligo, or suspicious lesions  Neuro:  Mental Status: Remains with her eyes closed, responds to noxious stimuli with all 4 extremities, does not follow verbal commands, is able to state clearly "stop, that hurts ". Cranial Nerves: II: Blind in left eye partially blind in right eye.  Resists eye opening.  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  Moves lower extremities antigravity to plantar stimuli Sensory: As noted above.  Deep Tendon Reflexes: Hypoactive and symmetric throughout Plantars: Mute    Medications:  Scheduled: . enoxaparin (LOVENOX) injection  40 mg Subcutaneous QHS  . prednisoLONE acetate  1 drop Both Eyes QHS    Pertinent Labs/Diagnostics:  LTM results: This wasa moderately abnormal continuous video EEG due to diffuse, left maximal slowing and some runs of GRDA. This was indicative of a diffuse or multifocal cerebral disturbance. No further  seizures were seen.   Etta Quill PA-C [facility] [phone]  Assessment: 85 year old female brought to hospital secondary to status epilepticus.   1. Initial EEG did show epileptiform activity. However, LTM overnight did not show any epileptiform activity and seizures have resolved with Vimpat and valproic acid.    2. Mentation improving as noted above. Most likely a slowly resolving postictal state.    Recommendations: - LTM has been discontinued - Continue Vimpat 100 mg BID and valproic acid 500 mg TID - Continue seizure precautions - May consider MRI to assess for possible cortical edema given her presentation with status epilepticus and slow improvement.    Electronically signed: Dr. Kerney Elbe 04/28/2018, 9:42 AM

## 2018-04-28 NOTE — Progress Notes (Addendum)
Cardizem gtt started.

## 2018-04-28 NOTE — Progress Notes (Signed)
EEG LTM maint complete. No skin breakdown. Continue to monitor 

## 2018-04-28 NOTE — Progress Notes (Signed)
Throughout shift, patient has been more arouseable and speech has been clearer.  With patience, she can be understood and can understand simple instructions, though this is not consistent.  Remains confused and requires frequent re-orientation.  No seizure activity assessed this shift.  Patient has been closely monitored by family, who have been strongly supported by staff.  They have been clear about their requests as far as her code status is concerned.  Questions have been answered to the family's satisfaction AEB appropriate responses through "teach-back" technique.  NPO  Status remains due to lethargy.  Frequent po care performed.

## 2018-04-28 NOTE — Progress Notes (Signed)
PROGRESS NOTE    [name] [name]   JKD:[id number]  DOB: March 26, 1924  DOA: 04/26/2018 PCP: Darcus Austin, MD (Inactive)   Brief Narrative:  [name] [name]  is a 85 y.o. female with medical history significant of PAF, stroke, vascular dementia, blind in L eye, minimal vision in R, HTN who presented from home for a seizure (tonic clonic) noted to have another seizure by EMS and given Versed. She continued to have seizures with eyes being deviated to the left and was given Vimpat. Had another tonic clonic seizure and then given Depakote load after which her seizures resolved. She was noted to be subsequently lethargic and hypoxic. According to her daughter and her aid she had notable aspiration that morning at home when drinking water. She also has dementia and had been awake for about 2 days prior to this.   She was admitted from 04/21/18-04/22/18 for A-fib with RVR and was started on Cardizem.   Subjective: Remains lethargic.    Assessment & Plan:   Principal Problem:   Non-convulsive status epilepticus  - CT head > Multiple stable chronic infarctions, microvascular ischemic changes, and volume loss of the brain. - CTA head/neck- no significant new stenosis - resolved after Depakote load- neuro following- on Vimpat Q 12 hrs - no prior h/ o seizure but has had a CVA and was not sleeping lately- may have also aspirated at home causing further stress  Active Problems: Lethargy - likely post ictal- may also be due to sedative effects of AED - follow - cont IVF while lethargic - no improvement in lethargy today  Acute hypoxic respiratory failure, low grade fever 100.4, leukocytosis, hypotension (88/44) > sepsis - noted aspiration at home when she coughed multiple times on water- may have also aspirated during seizures - left perihilar opacity noted on CXR - cont zosyn- MRSA PCR neg- d/c'd Vancomycin - blood cultures negative thus far - have discussed with daughter that the next 24-48 hrs will  be critical to see if she turns around- have discussed that I will also call palliative care - leukocytosis has improved but there is no clinical change in her condition today- cont to follow  Lactic acidosis - due to seizures and hypoxia- improved    History of Rt brain stroke Oct 2015     PAF (paroxysmal atrial fibrillation)   - NSR when admitted - holding oral Cardizem as lethargic-  Cardizem infusion started today as she developed A-fib with RVR - takes Plavix only for prior CVA and is not on other anticoagulation    Blind left eye- (Fuch's disease)    Vascular dementia   - holding Aricept, Zoloft, Risperdal, Clonopin and Trazodone for now   Time spent in minutes: 35 min DVT prophylaxis: Lovenox Code Status: DNR Family Communication: daughter and son in law Disposition Plan: cont to follow for improvement- palliative care consult requested in case she does not improve Consultants:   neuro Procedures:   EEG Antimicrobials:  Anti-infectives (From admission, onward)   Start     Dose/Rate Route Frequency Ordered Stop   04/28/18 0600  vancomycin (VANCOCIN) 500 mg in sodium chloride 0.9 % 100 mL IVPB  Status:  Discontinued     500 mg 100 mL/hr over 60 Minutes Intravenous Every 24 hours 04/27/18 0548 04/27/18 1727   04/27/18 0600  vancomycin (VANCOCIN) 1,250 mg in sodium chloride 0.9 % 250 mL IVPB     1,250 mg 166.7 mL/hr over 90 Minutes Intravenous  Once 04/27/18 0548 04/27/18 0859  04/27/18 0600  ceFEPIme (MAXIPIME) 1 g in sodium chloride 0.9 % 100 mL IVPB  Status:  Discontinued     1 g 200 mL/hr over 30 Minutes Intravenous Every 12 hours 04/27/18 0548 04/27/18 0554   04/27/18 0600  piperacillin-tazobactam (ZOSYN) IVPB 3.375 g     3.375 g 12.5 mL/hr over 240 Minutes Intravenous Every 8 hours 04/27/18 0554         Objective: Vitals:   04/28/18 1145 04/28/18 1215 04/28/18 1245 04/28/18 1315  BP: 103/65 101/63 (!) 108/50 120/74  Pulse: (!) 124 90 72 (!) 120  Resp:  15 18 19 19   Temp: 98.4 F (36.9 C)     TempSrc: Axillary     SpO2: 97% 95% 96% 96%  Weight:      Height:        Intake/Output Summary (Last 24 hours) at 04/28/2018 1454 Last data filed at 04/28/2018 1300 Gross per 24 hour  Intake 1814.36 ml  Output 2150 ml  Net -335.64 ml   Filed Weights   04/26/18 1700 04/26/18 1835  Weight: 78.2 kg 78.2 kg    Examination: General exam: lethargic- responds to pain only HEENT: PERRLA, oral mucosa moist, no sclera icterus or thrush Respiratory system: Clear to auscultation. Respiratory effort normal. Cardiovascular system: S1 & S2 heard,  No murmurs  Gastrointestinal system: Abdomen soft, non-tender, nondistended. Normal bowel sound. No organomegaly Central nervous system: flaccid extermities Extremities: No cyanosis, clubbing or edema Skin: No rashes or ulcers Psychiatry:  cannot assess    Data Reviewed: I have personally reviewed following labs and imaging studies  CBC: Recent Labs  Lab 04/22/18 0551 04/26/18 1747 04/27/18 0322 04/28/18 0502  WBC 6.2 6.3 13.6* 7.8  NEUTROABS  --  4.1 11.9*  --   HGB 12.6 13.4 13.0 12.9  HCT 40.6 43.7 41.0 40.6  MCV 94.6 97.5 93.4 95.5  PLT 184 209 194 150   Basic Metabolic Panel: Recent Labs  Lab 04/22/18 0551 04/26/18 1747 04/27/18 0322 04/28/18 0502  NA 138 138 136 140  K 3.9 4.9 4.1 3.6  CL 103 102 100 104  CO2 27 18* 25 25  GLUCOSE 109* 161* 136* 72  BUN 19 18 9 15   CREATININE 0.76 0.82  0.50 0.83 0.70  CALCIUM 8.7* 9.1 8.6* 8.6*   GFR: Estimated Creatinine Clearance: 44.3 mL/min (by C-G formula based on SCr of 0.7 mg/dL). Liver Function Tests: Recent Labs  Lab 04/26/18 1747  AST 47*  ALT 18  ALKPHOS 86  BILITOT 1.5*  PROT 6.3*  ALBUMIN 3.5   No results for input(s): LIPASE, AMYLASE in the last 168 hours. No results for input(s): AMMONIA in the last 168 hours. Coagulation Profile: Recent Labs  Lab 04/26/18 1747  INR 1.08   Cardiac Enzymes: No results for  input(s): CKTOTAL, CKMB, CKMBINDEX, TROPONINI in the last 168 hours. BNP (last 3 results) No results for input(s): PROBNP in the last 8760 hours. HbA1C: No results for input(s): HGBA1C in the last 72 hours. CBG: Recent Labs  Lab 04/26/18 1737  GLUCAP 137*   Lipid Profile: No results for input(s): CHOL, HDL, LDLCALC, TRIG, CHOLHDL, LDLDIRECT in the last 72 hours. Thyroid Function Tests: No results for input(s): TSH, T4TOTAL, FREET4, T3FREE, THYROIDAB in the last 72 hours. Anemia Panel: No results for input(s): VITAMINB12, FOLATE, FERRITIN, TIBC, IRON, RETICCTPCT in the last 72 hours. Urine analysis:    Component Value Date/Time   COLORURINE YELLOW 04/21/2018 [place] 04/21/2018 1201  LABSPEC 1.009 04/21/2018 1201   PHURINE 6.0 04/21/2018 [place] 04/21/2018 [place] 04/21/2018 [place] 04/21/2018 [place] 04/21/2018 1201   PROTEINUR NEGATIVE 04/21/2018 1201   UROBILINOGEN 0.2 12/19/2014 1830   NITRITE NEGATIVE 04/21/2018 1201   LEUKOCYTESUR MODERATE (A) 04/21/2018 1201   Sepsis Labs: @LABRCNTIP (procalcitonin:4,lacticidven:4) ) Recent Results (from the past 240 hour(s))  Urine culture     Status: Abnormal   Collection Time: 04/21/18  2:00 PM  Result Value Ref Range Status   Specimen Description URINE, CLEAN CATCH  Final   Special Requests   Final    NONE Performed at [facility], [place] [address], [place], [place] [address]    Culture (A)  Final    20,000 COLONIES/mL MULTIPLE SPECIES PRESENT, SUGGEST RECOLLECTION   Report Status 04/22/2018 FINAL  Final  Culture, blood (routine x 2)     Status: None (Preliminary result)   Collection Time: 04/27/18  3:29 AM  Result Value Ref Range Status   Specimen Description BLOOD LEFT HAND  Final   Special Requests   Final    BOTTLES DRAWN AEROBIC AND ANAEROBIC Blood Culture adequate volume   Culture   Final    NO GROWTH 1 DAY Performed at  [facility], [place] [address], [place], [place] [address]    Report Status PENDING  Incomplete  Culture, blood (routine x 2)     Status: None (Preliminary result)   Collection Time: 04/27/18  3:36 AM  Result Value Ref Range Status   Specimen Description BLOOD RIGHT ARM  Final   Special Requests   Final    BOTTLES DRAWN AEROBIC AND ANAEROBIC Blood Culture adequate volume   Culture   Final    NO GROWTH 1 DAY Performed at [facility], [place] [address], [place], [place] [address]    Report Status PENDING  Incomplete  MRSA PCR Screening     Status: None   Collection Time: 04/27/18 10:41 AM  Result Value Ref Range Status   MRSA by PCR NEGATIVE NEGATIVE Final    Comment:        The GeneXpert MRSA Assay (FDA approved for NASAL specimens only), is one component of a comprehensive MRSA colonization surveillance program. It is not intended to diagnose MRSA infection nor to guide or monitor treatment for MRSA infections. Performed at [facility], [place] [address], [place],  [address]          Radiology Studies: Ct Angio Head W Or Wo Contrast  Result Date: 04/26/2018 CLINICAL DATA:  85 y/o F; altered mental status, left-sided gaze, bilateral weakness. EXAM: CT ANGIOGRAPHY HEAD AND NECK CT PERFUSION BRAIN TECHNIQUE: Multidetector CT imaging of the head and neck was performed using the standard protocol during bolus administration of intravenous contrast. Multiplanar CT image reconstructions and MIPs were obtained to evaluate the vascular anatomy. Carotid stenosis measurements (when applicable) are obtained utilizing NASCET criteria, using the distal internal carotid diameter as the denominator. Multiphase CT imaging of the brain was performed following IV bolus contrast injection. Subsequent parametric perfusion maps were calculated using RAPID software. CONTRAST:  167mL ISOVUE-370 IOPAMIDOL (ISOVUE-370) INJECTION 76% COMPARISON:  04/21/2018 in 01/15/2018 CT head.  09/21/2017 CT angiogram neck, CT angiogram head, CT perfusion head. 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 Small chronic infarctions in the cerebellum, right hemi pons, bilateral thalami. Small chronic cortical infarction in  left posterior frontal lobe. Nonspecific white matter hypodensities compatible with chronic microvascular ischemic changes and volume loss of the brain are stable. No acute stroke, hemorrhage, mass effect, extra-axial collection, hydrocephalus, or herniation identified. Vascular: As below. Sinuses/Orbits: No acute finding. Other: Bilateral intra-ocular lens replacement. ASPECTS ([place] Stroke Program Early CT Score) - Ganglionic level infarction (caudate, lentiform nuclei, internal capsule, insula, M1-M3 cortex): 7 - Supraganglionic infarction (M4-M6 cortex): 3 Total score (0-10 with 10 being normal): 10 Review of the MIP images confirms the above findings CTA NECK FINDINGS Aortic arch: Standard branching. Imaged portion shows no evidence of aneurysm or dissection. No significant stenosis of the major arch vessel origins. Mild calcific atherosclerosis. Right carotid system: No evidence of dissection, stenosis (50% or greater) or occlusion. Non stenotic calcified plaque of the carotid bifurcation. Left carotid system: No evidence of dissection, stenosis (50% or greater) or occlusion. Non stenotic calcified plaque of the carotid bifurcation. Vertebral arteries: Left dominant. No evidence of dissection, stenosis (50% or greater) or occlusion. Skeleton: Negative. Other neck: Negative. Upper chest: Negative. Review of the MIP images confirms the above findings CTA HEAD FINDINGS Anterior circulation: No high-grade proximal stenosis, proximal occlusion, aneurysm, or vascular malformation. The left MCA inferior division M3 branch is reconstituted, but with persistent proximal stenosis. Posterior circulation: No significant stenosis, proximal  occlusion, aneurysm, or vascular malformation. There multiple segments of mild stenosis in the anterior and posterior intracranial circulation compatible with atherosclerosis, similar to prior CTA of the head. Venous sinuses: As permitted by contrast timing, patent. Anatomic variants: None significant. Delayed phase: No abnormal intracranial enhancement. Review of the MIP images confirms the above findings CT Brain Perfusion Findings: CBF (<30%) Volume: 76mL Perfusion (Tmax>6.0s) volume: 23mL, this is localized to the falx in likely misregistration artifact. Mismatch Volume: 61mL Infarction Location:Negative. Small focus of mildly decreased cerebral blood flow and increased cerebral blood volume within the left parietal lobe. IMPRESSION: CT head: 1. No acute intracranial abnormality identified. 2. ASPECTS is 10. 3. Multiple stable chronic infarctions, microvascular ischemic changes, and volume loss of the brain. CTA neck: 1. Patent carotid and vertebral arteries. No dissection, aneurysm, or hemodynamically significant stenosis by NASCET criteria. 2. Mild calcific atherosclerosis of the carotid bifurcations in the aorta. CTA head: 1. No new large vessel occlusion, high-grade stenosis, aneurysm, or vascular malformation. 2. Reconstitution of left MCA inferior division M3 branch, persistent stenosis of the branch origin. 3. Intracranial atherosclerosis with multiple segments of mild stenosis in the anterior and posterior circulation. CT perfusion head: 1. No acute infarction or ischemic penumbra by standard CT perfusion criteria. 2. Small focus of mildly decreased cerebral blood flow and increased cerebral blood volume in the left parietal lobe compatible with oligemia without ischemia. These results were called by telephone at the time of interpretation on 04/26/2018 at 6:30 pm to Dr. Cheral Marker, who verbally acknowledged these results. Electronically Signed   By: Kristine Garbe M.D.   On: 04/26/2018 18:33   Ct  Angio Neck W Or Wo Contrast  Result Date: 04/26/2018 CLINICAL DATA:  85 y/o F; altered mental status, left-sided gaze, bilateral weakness. EXAM: CT ANGIOGRAPHY HEAD AND NECK CT PERFUSION BRAIN TECHNIQUE: Multidetector CT imaging of the head and neck was performed using the standard protocol during bolus administration of intravenous contrast. Multiplanar CT image reconstructions and MIPs were obtained to evaluate the vascular anatomy. Carotid stenosis measurements (when applicable) are obtained utilizing NASCET criteria, using the distal internal carotid diameter as the denominator. Multiphase CT imaging of the brain was performed  following IV bolus contrast injection. Subsequent parametric perfusion maps were calculated using RAPID software. CONTRAST:  154mL ISOVUE-370 IOPAMIDOL (ISOVUE-370) INJECTION 76% COMPARISON:  04/21/2018 in 01/15/2018 CT head. 09/21/2017 CT angiogram neck, CT angiogram head, CT perfusion head. 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 Small chronic infarctions in the cerebellum, right hemi pons, bilateral thalami. Small chronic cortical infarction in left posterior frontal lobe. Nonspecific white matter hypodensities compatible with chronic microvascular ischemic changes and volume loss of the brain are stable. No acute stroke, hemorrhage, mass effect, extra-axial collection, hydrocephalus, or herniation identified. Vascular: As below. Sinuses/Orbits: No acute finding. Other: Bilateral intra-ocular lens replacement. ASPECTS ([place] Stroke Program Early CT Score) - Ganglionic level infarction (caudate, lentiform nuclei, internal capsule, insula, M1-M3 cortex): 7 - Supraganglionic infarction (M4-M6 cortex): 3 Total score (0-10 with 10 being normal): 10 Review of the MIP images confirms the above findings CTA NECK FINDINGS Aortic arch: Standard branching. Imaged portion shows no evidence of aneurysm or dissection. No significant  stenosis of the major arch vessel origins. Mild calcific atherosclerosis. Right carotid system: No evidence of dissection, stenosis (50% or greater) or occlusion. Non stenotic calcified plaque of the carotid bifurcation. Left carotid system: No evidence of dissection, stenosis (50% or greater) or occlusion. Non stenotic calcified plaque of the carotid bifurcation. Vertebral arteries: Left dominant. No evidence of dissection, stenosis (50% or greater) or occlusion. Skeleton: Negative. Other neck: Negative. Upper chest: Negative. Review of the MIP images confirms the above findings CTA HEAD FINDINGS Anterior circulation: No high-grade proximal stenosis, proximal occlusion, aneurysm, or vascular malformation. The left MCA inferior division M3 branch is reconstituted, but with persistent proximal stenosis. Posterior circulation: No significant stenosis, proximal occlusion, aneurysm, or vascular malformation. There multiple segments of mild stenosis in the anterior and posterior intracranial circulation compatible with atherosclerosis, similar to prior CTA of the head. Venous sinuses: As permitted by contrast timing, patent. Anatomic variants: None significant. Delayed phase: No abnormal intracranial enhancement. Review of the MIP images confirms the above findings CT Brain Perfusion Findings: CBF (<30%) Volume: 98mL Perfusion (Tmax>6.0s) volume: 20mL, this is localized to the falx in likely misregistration artifact. Mismatch Volume: 59mL Infarction Location:Negative. Small focus of mildly decreased cerebral blood flow and increased cerebral blood volume within the left parietal lobe. IMPRESSION: CT head: 1. No acute intracranial abnormality identified. 2. ASPECTS is 10. 3. Multiple stable chronic infarctions, microvascular ischemic changes, and volume loss of the brain. CTA neck: 1. Patent carotid and vertebral arteries. No dissection, aneurysm, or hemodynamically significant stenosis by NASCET criteria. 2. Mild calcific  atherosclerosis of the carotid bifurcations in the aorta. CTA head: 1. No new large vessel occlusion, high-grade stenosis, aneurysm, or vascular malformation. 2. Reconstitution of left MCA inferior division M3 branch, persistent stenosis of the branch origin. 3. Intracranial atherosclerosis with multiple segments of mild stenosis in the anterior and posterior circulation. CT perfusion head: 1. No acute infarction or ischemic penumbra by standard CT perfusion criteria. 2. Small focus of mildly decreased cerebral blood flow and increased cerebral blood volume in the left parietal lobe compatible with oligemia without ischemia. These results were called by telephone at the time of interpretation on 04/26/2018 at 6:30 pm to Dr. Cheral Marker, who verbally acknowledged these results. Electronically Signed   By: Kristine Garbe M.D.   On: 04/26/2018 18:33   Ct Cerebral Perfusion W Contrast  Result Date: 04/26/2018 CLINICAL DATA:  85 y/o F; altered mental status, left-sided gaze, bilateral weakness. EXAM: CT ANGIOGRAPHY HEAD  AND NECK CT PERFUSION BRAIN TECHNIQUE: Multidetector CT imaging of the head and neck was performed using the standard protocol during bolus administration of intravenous contrast. Multiplanar CT image reconstructions and MIPs were obtained to evaluate the vascular anatomy. Carotid stenosis measurements (when applicable) are obtained utilizing NASCET criteria, using the distal internal carotid diameter as the denominator. Multiphase CT imaging of the brain was performed following IV bolus contrast injection. Subsequent parametric perfusion maps were calculated using RAPID software. CONTRAST:  130mL ISOVUE-370 IOPAMIDOL (ISOVUE-370) INJECTION 76% COMPARISON:  04/21/2018 in 01/15/2018 CT head. 09/21/2017 CT angiogram neck, CT angiogram head, CT perfusion head. 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 Small chronic infarctions in the  cerebellum, right hemi pons, bilateral thalami. Small chronic cortical infarction in left posterior frontal lobe. Nonspecific white matter hypodensities compatible with chronic microvascular ischemic changes and volume loss of the brain are stable. No acute stroke, hemorrhage, mass effect, extra-axial collection, hydrocephalus, or herniation identified. Vascular: As below. Sinuses/Orbits: No acute finding. Other: Bilateral intra-ocular lens replacement. ASPECTS ([place] Stroke Program Early CT Score) - Ganglionic level infarction (caudate, lentiform nuclei, internal capsule, insula, M1-M3 cortex): 7 - Supraganglionic infarction (M4-M6 cortex): 3 Total score (0-10 with 10 being normal): 10 Review of the MIP images confirms the above findings CTA NECK FINDINGS Aortic arch: Standard branching. Imaged portion shows no evidence of aneurysm or dissection. No significant stenosis of the major arch vessel origins. Mild calcific atherosclerosis. Right carotid system: No evidence of dissection, stenosis (50% or greater) or occlusion. Non stenotic calcified plaque of the carotid bifurcation. Left carotid system: No evidence of dissection, stenosis (50% or greater) or occlusion. Non stenotic calcified plaque of the carotid bifurcation. Vertebral arteries: Left dominant. No evidence of dissection, stenosis (50% or greater) or occlusion. Skeleton: Negative. Other neck: Negative. Upper chest: Negative. Review of the MIP images confirms the above findings CTA HEAD FINDINGS Anterior circulation: No high-grade proximal stenosis, proximal occlusion, aneurysm, or vascular malformation. The left MCA inferior division M3 branch is reconstituted, but with persistent proximal stenosis. Posterior circulation: No significant stenosis, proximal occlusion, aneurysm, or vascular malformation. There multiple segments of mild stenosis in the anterior and posterior intracranial circulation compatible with atherosclerosis, similar to prior CTA of  the head. Venous sinuses: As permitted by contrast timing, patent. Anatomic variants: None significant. Delayed phase: No abnormal intracranial enhancement. Review of the MIP images confirms the above findings CT Brain Perfusion Findings: CBF (<30%) Volume: 37mL Perfusion (Tmax>6.0s) volume: 59mL, this is localized to the falx in likely misregistration artifact. Mismatch Volume: 29mL Infarction Location:Negative. Small focus of mildly decreased cerebral blood flow and increased cerebral blood volume within the left parietal lobe. IMPRESSION: CT head: 1. No acute intracranial abnormality identified. 2. ASPECTS is 10. 3. Multiple stable chronic infarctions, microvascular ischemic changes, and volume loss of the brain. CTA neck: 1. Patent carotid and vertebral arteries. No dissection, aneurysm, or hemodynamically significant stenosis by NASCET criteria. 2. Mild calcific atherosclerosis of the carotid bifurcations in the aorta. CTA head: 1. No new large vessel occlusion, high-grade stenosis, aneurysm, or vascular malformation. 2. Reconstitution of left MCA inferior division M3 branch, persistent stenosis of the branch origin. 3. Intracranial atherosclerosis with multiple segments of mild stenosis in the anterior and posterior circulation. CT perfusion head: 1. No acute infarction or ischemic penumbra by standard CT perfusion criteria. 2. Small focus of mildly decreased cerebral blood flow and increased cerebral blood volume in the left parietal lobe compatible with oligemia without ischemia.  These results were called by telephone at the time of interpretation on 04/26/2018 at 6:30 pm to Dr. Cheral Marker, who verbally acknowledged these results. Electronically Signed   By: Kristine Garbe M.D.   On: 04/26/2018 18:33   Dg Chest Port 1 View  Result Date: 04/27/2018 CLINICAL DATA:  Fever.  Aspiration. EXAM: PORTABLE CHEST 1 VIEW COMPARISON:  04/26/2018 FINDINGS: Cardiomegaly and aortic tortuosity distorted by leftward  rotation. There is an implantable loop recorder. Worsening left perihilar opacity but no evident volume loss. No Kerley lines, effusion, or pneumothorax. IMPRESSION: 1. Limited rotated chest. 2. Equivocal for worsening left perihilar opacity which could reflect infection or aspiration in this setting. Electronically Signed   By: Monte Fantasia M.D.   On: 04/27/2018 09:46   Dg Chest Portable 1 View  Result Date: 04/26/2018 CLINICAL DATA:  85 year old female with altered mental status. EXAM: PORTABLE CHEST 1 VIEW COMPARISON:  Portable chest 04/21/2018 and earlier. FINDINGS: Portable AP semi upright view at 1930 hours. The patient is rotated to the left. Cardiomegaly and mediastinal contours are stable. Stable left chest cardiac event recorder. Allowing for portable technique the lungs are clear. No pneumothorax. Paucity of bowel gas in the upper abdomen. Stable cholecystectomy clips. IMPRESSION: No acute cardiopulmonary abnormality. Electronically Signed   By: Genevie Ann M.D.   On: 04/26/2018 19:51   Ct Head Code Stroke Wo Contrast  Result Date: 04/26/2018 CLINICAL DATA:  85 y/o F; altered mental status, left-sided gaze, bilateral weakness. EXAM: CT ANGIOGRAPHY HEAD AND NECK CT PERFUSION BRAIN TECHNIQUE: Multidetector CT imaging of the head and neck was performed using the standard protocol during bolus administration of intravenous contrast. Multiplanar CT image reconstructions and MIPs were obtained to evaluate the vascular anatomy. Carotid stenosis measurements (when applicable) are obtained utilizing NASCET criteria, using the distal internal carotid diameter as the denominator. Multiphase CT imaging of the brain was performed following IV bolus contrast injection. Subsequent parametric perfusion maps were calculated using RAPID software. CONTRAST:  147mL ISOVUE-370 IOPAMIDOL (ISOVUE-370) INJECTION 76% COMPARISON:  04/21/2018 in 01/15/2018 CT head. 09/21/2017 CT angiogram neck, CT angiogram head, CT perfusion  head. 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 Small chronic infarctions in the cerebellum, right hemi pons, bilateral thalami. Small chronic cortical infarction in left posterior frontal lobe. Nonspecific white matter hypodensities compatible with chronic microvascular ischemic changes and volume loss of the brain are stable. No acute stroke, hemorrhage, mass effect, extra-axial collection, hydrocephalus, or herniation identified. Vascular: As below. Sinuses/Orbits: No acute finding. Other: Bilateral intra-ocular lens replacement. ASPECTS ([place] Stroke Program Early CT Score) - Ganglionic level infarction (caudate, lentiform nuclei, internal capsule, insula, M1-M3 cortex): 7 - Supraganglionic infarction (M4-M6 cortex): 3 Total score (0-10 with 10 being normal): 10 Review of the MIP images confirms the above findings CTA NECK FINDINGS Aortic arch: Standard branching. Imaged portion shows no evidence of aneurysm or dissection. No significant stenosis of the major arch vessel origins. Mild calcific atherosclerosis. Right carotid system: No evidence of dissection, stenosis (50% or greater) or occlusion. Non stenotic calcified plaque of the carotid bifurcation. Left carotid system: No evidence of dissection, stenosis (50% or greater) or occlusion. Non stenotic calcified plaque of the carotid bifurcation. Vertebral arteries: Left dominant. No evidence of dissection, stenosis (50% or greater) or occlusion. Skeleton: Negative. Other neck: Negative. Upper chest: Negative. Review of the MIP images confirms the above findings CTA HEAD FINDINGS Anterior circulation: No high-grade proximal stenosis, proximal occlusion, aneurysm, or vascular malformation. The left  MCA inferior division M3 branch is reconstituted, but with persistent proximal stenosis. Posterior circulation: No significant stenosis, proximal occlusion, aneurysm, or vascular malformation. There multiple  segments of mild stenosis in the anterior and posterior intracranial circulation compatible with atherosclerosis, similar to prior CTA of the head. Venous sinuses: As permitted by contrast timing, patent. Anatomic variants: None significant. Delayed phase: No abnormal intracranial enhancement. Review of the MIP images confirms the above findings CT Brain Perfusion Findings: CBF (<30%) Volume: 106mL Perfusion (Tmax>6.0s) volume: 34mL, this is localized to the falx in likely misregistration artifact. Mismatch Volume: 21mL Infarction Location:Negative. Small focus of mildly decreased cerebral blood flow and increased cerebral blood volume within the left parietal lobe. IMPRESSION: CT head: 1. No acute intracranial abnormality identified. 2. ASPECTS is 10. 3. Multiple stable chronic infarctions, microvascular ischemic changes, and volume loss of the brain. CTA neck: 1. Patent carotid and vertebral arteries. No dissection, aneurysm, or hemodynamically significant stenosis by NASCET criteria. 2. Mild calcific atherosclerosis of the carotid bifurcations in the aorta. CTA head: 1. No new large vessel occlusion, high-grade stenosis, aneurysm, or vascular malformation. 2. Reconstitution of left MCA inferior division M3 branch, persistent stenosis of the branch origin. 3. Intracranial atherosclerosis with multiple segments of mild stenosis in the anterior and posterior circulation. CT perfusion head: 1. No acute infarction or ischemic penumbra by standard CT perfusion criteria. 2. Small focus of mildly decreased cerebral blood flow and increased cerebral blood volume in the left parietal lobe compatible with oligemia without ischemia. These results were called by telephone at the time of interpretation on 04/26/2018 at 6:30 pm to Dr. Cheral Marker, who verbally acknowledged these results. Electronically Signed   By: Kristine Garbe M.D.   On: 04/26/2018 18:33      Scheduled Meds: . enoxaparin (LOVENOX) injection  40 mg  Subcutaneous QHS  . prednisoLONE acetate  1 drop Both Eyes QHS   Continuous Infusions: . sodium chloride 75 mL/hr at 04/28/18 0314  . diltiazem (CARDIZEM) infusion 15 mg/hr (04/28/18 1025)  . lacosamide (VIMPAT) IV 100 mg (04/28/18 0825)  . piperacillin-tazobactam (ZOSYN)  IV 3.375 g (04/28/18 0538)  . valproate sodium 500 mg (04/28/18 0540)     LOS: 2 days      Debbe Odea, MD [facility] Pager: [email or web address] Password [email or web address] 04/28/2018, 2:54 PM

## 2018-04-29 ENCOUNTER — Inpatient Hospital Stay (HOSPITAL_COMMUNITY): Payer: PPO

## 2018-04-29 LAB — CBC
HCT: 42.9 % (ref 36.0–46.0)
Hemoglobin: 13.7 g/dL (ref 12.0–15.0)
MCH: 29.8 pg (ref 26.0–34.0)
MCHC: 31.9 g/dL (ref 30.0–36.0)
MCV: 93.5 fL (ref 80.0–100.0)
PLATELETS: 181 10*3/uL (ref 150–400)
RBC: 4.59 MIL/uL (ref 3.87–5.11)
RDW: 13.2 % (ref 11.5–15.5)
WBC: 6.8 10*3/uL (ref 4.0–10.5)
nRBC: 0 % (ref 0.0–0.2)

## 2018-04-29 LAB — BASIC METABOLIC PANEL
Anion gap: 14 (ref 5–15)
BUN: 17 mg/dL (ref 8–23)
CO2: 26 mmol/L (ref 22–32)
Calcium: 8.5 mg/dL — ABNORMAL LOW (ref 8.9–10.3)
Chloride: 103 mmol/L (ref 98–111)
Creatinine, Ser: 0.72 mg/dL (ref 0.44–1.00)
GFR calc Af Amer: >60 mL/min (ref >60)
GFR calc non Af Amer: >60 mL/min (ref >60)
Glucose, Bld: 92 mg/dL (ref 70–99)
Potassium: 3.1 mmol/L — ABNORMAL LOW (ref 3.5–5.1)
Sodium: 143 mmol/L (ref 135–145)

## 2018-04-29 MED ORDER — METOPROLOL TARTRATE 5 MG/5ML IV SOLN
5.0000 mg | Freq: Once | INTRAVENOUS | Status: AC
  Administered 2018-04-29: 5 mg via INTRAVENOUS
  Filled 2018-04-29: qty 5

## 2018-04-29 NOTE — Progress Notes (Signed)
PROGRESS NOTE    [name] [name]   YQM:[id number]  DOB: 1924-04-12  DOA: 04/26/2018 PCP: Darcus Austin, MD (Inactive)   Brief Narrative:  [name] [name]  is a 85 y.o. female with medical history significant of PAF, stroke, vascular dementia, blind in L eye, minimal vision in R, HTN who presented from home for a seizure (tonic clonic) noted to have another seizure by EMS and given Versed. She continued to have seizures with eyes being deviated to the left and was given Vimpat. Had another tonic clonic seizure and then given Depakote load after which her seizures resolved. She was noted to be subsequently lethargic and hypoxic. According to her daughter and her aid she had notable aspiration that morning at home when drinking water. She also has dementia and had been awake for about 2 days prior to this.   She was admitted from 04/21/18-04/22/18 for A-fib with RVR and was started on Cardizem.   Subjective: More verbal today. Answers questions with eyes closed but does not full awaken yet.    Assessment & Plan:   Principal Problem:   Non-convulsive status epilepticus  - CT head > Multiple stable chronic infarctions, microvascular ischemic changes, and volume loss of the brain. - CTA head/neck- no significant new stenosis - resolved after Depakote load- neuro following- on Vimpat and Depakote IV - no prior h/ o seizure but has had a CVA and was not sleeping lately which can precipitate seizures- may have also aspirated at home causing further stress  Active Problems: Lethargy - likely post ictal- may also be due to sedative effects of AED - follow - cont IVF while lethargic- still not awake enough to eat or take oral meds  Acute hypoxic respiratory failure, low grade fever 100.4, leukocytosis, hypotension (88/44) > sepsis - noted aspiration at home when she coughed multiple times on water- may have also aspirated during seizures - left perihilar opacity noted on CXR on admission- on repeat  CXR, this is noted to have resolved - she continues to have an O2 requirement and is on 4 L O2 with pulse ox of 94-97% - cont zosyn- MRSA PCR neg- d/c Vancomycin - blood cultures negative   Lactic acidosis - due to seizures and hypoxia- improved    History of Rt brain stroke Oct 2015     PAF (paroxysmal atrial fibrillation)   - hold Cardizem as lethargic- cont Cardizem infusion- due to SVT, I had to order an IV dose of Lopressor today- HR improved - takes Plavix only for prior CVA and is not on other anticoagulation- this is on hold due to lethargy    Blind left eye- (Fuch's disease)    Vascular dementia   - holding Aricept, Zoloft, Risperdal, Clonopin and Trazodone for now   Time spent in minutes: 35 min DVT prophylaxis: Lovenox Code Status: DNR Family Communication: daughter and son in law Disposition Plan: cont to follow for improvement  Consultants:   neuro Procedures:   EEG Antimicrobials:  Anti-infectives (From admission, onward)   Start     Dose/Rate Route Frequency Ordered Stop   04/28/18 0600  vancomycin (VANCOCIN) 500 mg in sodium chloride 0.9 % 100 mL IVPB  Status:  Discontinued     500 mg 100 mL/hr over 60 Minutes Intravenous Every 24 hours 04/27/18 0548 04/27/18 1727   04/27/18 0600  vancomycin (VANCOCIN) 1,250 mg in sodium chloride 0.9 % 250 mL IVPB     1,250 mg 166.7 mL/hr over 90 Minutes Intravenous  Once 04/27/18  7893 04/27/18 0859   04/27/18 0600  ceFEPIme (MAXIPIME) 1 g in sodium chloride 0.9 % 100 mL IVPB  Status:  Discontinued     1 g 200 mL/hr over 30 Minutes Intravenous Every 12 hours 04/27/18 0548 04/27/18 0554   04/27/18 0600  piperacillin-tazobactam (ZOSYN) IVPB 3.375 g     3.375 g 12.5 mL/hr over 240 Minutes Intravenous Every 8 hours 04/27/18 0554         Objective: Vitals:   04/29/18 1103 04/29/18 1200 04/29/18 1216 04/29/18 1311  BP: (!) 147/79 (!) 122/52  135/63  Pulse: 90 (!) 32 64 74  Resp: 15 18 18 20   Temp:  98.7 F (37.1 C)     TempSrc:  Axillary    SpO2: 97% 97% 98% 98%  Weight:      Height:        Intake/Output Summary (Last 24 hours) at 04/29/2018 1457 Last data filed at 04/29/2018 0600 Gross per 24 hour  Intake 1538.52 ml  Output -  Net 1538.52 ml   Filed Weights   04/26/18 1700 04/26/18 1835  Weight: 78.2 kg 78.2 kg    Examination: General exam: Appears comfortable  HEENT: PERRLA, oral mucosa moist, no sclera icterus or thrush Respiratory system: Clear to auscultation. Respiratory effort normal. Cardiovascular system: S1 & S2 heard,  No murmurs  Gastrointestinal system: Abdomen soft, non-tender, nondistended. Normal bowel sound. No organomegaly Central nervous system: difficult to assess, speaks when spoken to but does not seem oriented- will not open eyes Extremities: No cyanosis, clubbing or edema Skin: No rashes or ulcers Psychiatry:  cannot assess    Data Reviewed: I have personally reviewed following labs and imaging studies  CBC: Recent Labs  Lab 04/26/18 1747 04/27/18 0322 04/28/18 0502 04/29/18 0653  WBC 6.3 13.6* 7.8 6.8  NEUTROABS 4.1 11.9*  --   --   HGB 13.4 13.0 12.9 13.7  HCT 43.7 41.0 40.6 42.9  MCV 97.5 93.4 95.5 93.5  PLT 209 194 163 810   Basic Metabolic Panel: Recent Labs  Lab 04/26/18 1747 04/27/18 0322 04/28/18 0502 04/29/18 0653  NA 138 136 140 143  K 4.9 4.1 3.6 3.1*  CL 102 100 104 103  CO2 18* 25 25 26   GLUCOSE 161* 136* 72 92  BUN 18 9 15 17   CREATININE 0.82  0.50 0.83 0.70 0.72  CALCIUM 9.1 8.6* 8.6* 8.5*   GFR: Estimated Creatinine Clearance: 44.3 mL/min (by C-G formula based on SCr of 0.72 mg/dL). Liver Function Tests: Recent Labs  Lab 04/26/18 1747  AST 47*  ALT 18  ALKPHOS 86  BILITOT 1.5*  PROT 6.3*  ALBUMIN 3.5   No results for input(s): LIPASE, AMYLASE in the last 168 hours. No results for input(s): AMMONIA in the last 168 hours. Coagulation Profile: Recent Labs  Lab 04/26/18 1747  INR 1.08   Cardiac Enzymes: No  results for input(s): CKTOTAL, CKMB, CKMBINDEX, TROPONINI in the last 168 hours. BNP (last 3 results) No results for input(s): PROBNP in the last 8760 hours. HbA1C: No results for input(s): HGBA1C in the last 72 hours. CBG: Recent Labs  Lab 04/26/18 1737  GLUCAP 137*   Lipid Profile: No results for input(s): CHOL, HDL, LDLCALC, TRIG, CHOLHDL, LDLDIRECT in the last 72 hours. Thyroid Function Tests: No results for input(s): TSH, T4TOTAL, FREET4, T3FREE, THYROIDAB in the last 72 hours. Anemia Panel: No results for input(s): VITAMINB12, FOLATE, FERRITIN, TIBC, IRON, RETICCTPCT in the last 72 hours. Urine analysis:    Component  Value Date/Time   COLORURINE YELLOW 04/21/2018 [place] 04/21/2018 1201   LABSPEC 1.009 04/21/2018 1201   PHURINE 6.0 04/21/2018 1201   GLUCOSEU NEGATIVE 04/21/2018 [place] 04/21/2018 [place] 04/21/2018 [place] 04/21/2018 1201   PROTEINUR NEGATIVE 04/21/2018 1201   UROBILINOGEN 0.2 12/19/2014 1830   NITRITE NEGATIVE 04/21/2018 1201   LEUKOCYTESUR MODERATE (A) 04/21/2018 1201   Sepsis Labs: @LABRCNTIP (procalcitonin:4,lacticidven:4) ) Recent Results (from the past 240 hour(s))  Urine culture     Status: Abnormal   Collection Time: 04/21/18  2:00 PM  Result Value Ref Range Status   Specimen Description URINE, CLEAN CATCH  Final   Special Requests   Final    NONE Performed at [facility], [place] [address] [place], [place] [address]    Culture (A)  Final    20,000 COLONIES/mL MULTIPLE SPECIES PRESENT, SUGGEST RECOLLECTION   Report Status 04/22/2018 FINAL  Final  Culture, blood (routine x 2)     Status: None (Preliminary result)   Collection Time: 04/27/18  3:29 AM  Result Value Ref Range Status   Specimen Description BLOOD LEFT HAND  Final   Special Requests   Final    BOTTLES DRAWN AEROBIC AND ANAEROBIC Blood Culture adequate volume   Culture   Final    NO GROWTH 2  DAYS Performed at [facility], [place] [address], [place], [place] [address]    Report Status PENDING  Incomplete  Culture, blood (routine x 2)     Status: None (Preliminary result)   Collection Time: 04/27/18  3:36 AM  Result Value Ref Range Status   Specimen Description BLOOD RIGHT ARM  Final   Special Requests   Final    BOTTLES DRAWN AEROBIC AND ANAEROBIC Blood Culture adequate volume   Culture   Final    NO GROWTH 2 DAYS Performed at [facility], [address], [place], [place] [address]    Report Status PENDING  Incomplete  MRSA PCR Screening     Status: None   Collection Time: 04/27/18 10:41 AM  Result Value Ref Range Status   MRSA by PCR NEGATIVE NEGATIVE Final    Comment:        The GeneXpert MRSA Assay (FDA approved for NASAL specimens only), is one component of a comprehensive MRSA colonization surveillance program. It is not intended to diagnose MRSA infection nor to guide or monitor treatment for MRSA infections. Performed at [facility], [place] [address], [place], [place] [address]          Radiology Studies: Dg Chest Port 1 View  Result Date: 04/29/2018 CLINICAL DATA:  Hypoxia EXAM: PORTABLE CHEST 1 VIEW COMPARISON:  04/27/2018 FINDINGS: Heart size upper normal. Cardiac loop recorder. Ectatic thoracic aorta with calcification unchanged. Improved aeration in the left lung. Lungs are now clear without infiltrate or effusion. Negative for heart failure or edema. IMPRESSION: Interval clearing of left perihilar airspace disease. Lungs are now clear. Electronically Signed   By: Franchot Gallo M.D.   On: 04/29/2018 08:22      Scheduled Meds: . enoxaparin (LOVENOX) injection  40 mg Subcutaneous QHS  . prednisoLONE acetate  1 drop Both Eyes QHS   Continuous Infusions: . sodium chloride 75 mL/hr at 04/29/18 1441  . diltiazem (CARDIZEM) infusion 15 mg/hr (04/29/18 0926)  . lacosamide (VIMPAT) IV 100 mg (04/29/18 0753)  . piperacillin-tazobactam  (ZOSYN)  IV 3.375 g (04/29/18  1452)  . valproate sodium 500 mg (04/29/18 1447)     LOS: 3 days      Debbe Odea, MD [facility] Pager: [email or web address] Password [email or web address] 04/29/2018, 2:57 PM

## 2018-04-29 NOTE — Care Management Important Message (Signed)
Important Message  Patient Details  Name: Jaslene Marsteller MRN: [id number] Date of Birth: 12/09/1924  Due to illness patient was not able to sign.  Unsigned copy left. Medicare Important Message Given:  Yes    Daneka Lantigua 04/29/2018, 12:42 PM

## 2018-04-30 ENCOUNTER — Inpatient Hospital Stay (HOSPITAL_COMMUNITY): Payer: PPO

## 2018-04-30 LAB — BASIC METABOLIC PANEL
Anion gap: 12 (ref 5–15)
Anion gap: 13 (ref 5–15)
BUN: 9 mg/dL (ref 8–23)
BUN: 10 mg/dL (ref 8–23)
CO2: 28 mmol/L (ref 22–32)
CO2: 26 mmol/L (ref 22–32)
Calcium: 8.5 mg/dL — ABNORMAL LOW (ref 8.9–10.3)
Calcium: 8.7 mg/dL — ABNORMAL LOW (ref 8.9–10.3)
Chloride: 99 mmol/L (ref 98–111)
Chloride: 101 mmol/L (ref 98–111)
Creatinine, Ser: 0.67 mg/dL (ref 0.44–1.00)
Creatinine, Ser: 0.62 mg/dL (ref 0.44–1.00)
GFR calc Af Amer: >60 mL/min (ref >60)
GFR calc Af Amer: >60 mL/min (ref >60)
GFR calc non Af Amer: >60 mL/min (ref >60)
GFR calc non Af Amer: >60 mL/min (ref >60)
GLUCOSE: 101 mg/dL — AB (ref 70–99)
Glucose, Bld: 106 mg/dL — ABNORMAL HIGH (ref 70–99)
Potassium: 2.7 mmol/L — CL (ref 3.5–5.1)
Potassium: 3.7 mmol/L (ref 3.5–5.1)
Sodium: 139 mmol/L (ref 135–145)
Sodium: 140 mmol/L (ref 135–145)

## 2018-04-30 LAB — MAGNESIUM: Magnesium: 2 mg/dL (ref 1.7–2.4)

## 2018-04-30 MED ORDER — POTASSIUM CHLORIDE 10 MEQ/100ML IV SOLN
10.0000 meq | INTRAVENOUS | Status: AC
  Administered 2018-04-30 (×4): 10 meq via INTRAVENOUS
  Filled 2018-04-30 (×5): qty 100

## 2018-04-30 MED ORDER — POTASSIUM CHLORIDE IN NACL 40-0.9 MEQ/L-% IV SOLN
INTRAVENOUS | Status: DC
  Administered 2018-04-30 – 2018-05-01 (×3): 75 mL/h via INTRAVENOUS
  Filled 2018-04-30 (×3): qty 1000

## 2018-04-30 NOTE — Progress Notes (Signed)
EEG Completed; Results Pending  

## 2018-04-30 NOTE — Progress Notes (Addendum)
NEUROLOGY PROGRESS NOTE  Subjective: Seizure   PMH- PAF, stroke, not on AC therapy w/ hx of GIB,vascular dementia, blind in L eye, minimal vision in R, HTN. Recent admission 1/302020 Afib RVR remained on Plavix w/ bleed risk.   Admitted- 04/26/18 ongoing seizures and status   First encounter today in the AM: Today her caregiver is at her bedside stating that [name] is back to her baseline. Her nurse states that she has no report nor has she seen seizure activity today. Sh appears comfortable without concern.   Second encounter today at 1315: I have returned to the bedside of [name] [name] with her son there for support. I have discussed his version of his perspective of his mothers baseline since her caregiver shared hers this morning and patient has become increasingly lethargic and hard to arouse by the internal medicine attending. Mr Tsukamoto (patients son) states that since July his mothers baseline has been with disorganized speech, and has gone in a downward trend. As far as her LOC she is much more tired this afternoon than she had been last night when he saw her. He states that she was able to wake up and give him a kiss today. She was easily awakened when I returned by voice and touch. She remains with perseveration of phrases, but when called "[name] [name]" she did open her eyes and say "what!" She is able to follow hand squeeze command bilaterally, and when asked is "Richardson Landry her baby" whom is her son she states "yes." I have updated her son on the waxing and waning changes, as well as compared assessments with the nurse who has seen these changes. Her son does believe that her lethargy may be secondary to receiving Ativan which in the past has taken days to clear out of her system.     Exam: Vitals:   04/29/18 2002 04/30/18 0145  BP: 136/88   Pulse: 80 79  Resp: 19   Temp:    SpO2: 96%     Physical Exam  HEENT-  Normocephalic, no lesions, without obvious abnormality.  Normal external eye and  conjunctiva. L eye blind, Right eye with decreased vision  Cardiovascular- S1-S2, currently controlled on max of Cardizem  Extremities- Warm, dry and intact Musculoskeletal-no joint tenderness, deformity or swelling Skin-warm and dry, no hyperpigmentation, vitiligo, or suspicious lesions non-elastic   Neuro:  Mental Status: confused- baseline. She perseverates at times. Speech is clear/fluent and disorganized, which apparently is her baseline Cranial Nerves: II: Blind in left eye, partially blind in right eye.  Resists eye opening.  No blink to threat III,IV, VI: Positive doll's, left eye is blind with corneal scarring and right pupil is oval but reactive V,VII: Face symmetrical and grimaces to pain; also yells to pain  VIII: Does not follow verbal commands Motor: Moves upper extremities antigravity localizing to pain with sternal rub.  Moves lower extremities antigravity to plantar stimuli Sensory: As noted above.  Deep Tendon Reflexes: Hypoactive and symmetric throughout Plantars: Mute    Medications:  Scheduled: . enoxaparin (LOVENOX) injection  40 mg Subcutaneous QHS  . prednisoLONE acetate  1 drop Both Eyes QHS    Pertinent Labs/Diagnostics: Hypokalemia K 3.1, Cr 0.70- Overall remainder of panel without concern  CBC unremarkable without concern today- all normal values- no sign of leukocytosis   Last Lactic initially 3.1 on  04/27/18 then rechecked 1.5  CT showed no bleeding or acute change. CTA head and neck no LVO. CTP essentially negative. While attempting with  stat limited MRI, pt had her 3 rd seizure lasting 1.46min. no ativan given due to fear of breathing compromise.   LTM results: This wasa moderately abnormal continuous video EEG due to diffuse, left maximal slowing and some runs of GRDA. This was indicative of a diffuse or multifocal cerebral disturbance. No further seizures were seen.    Assessment: 85 year old female brought to hospital with status epilepticus.  Seizures have resolved with Vimpat and valproic acid.  1. Initial EEG on 2/5: This was a markedly abnormal continuous video EEG due to a focal non-convulsive status epilepticus pattern in the left occipital region with some brief clinical seizures. These resolved with IV AED loading after 2300 with no further seizures or epileptiform discharges observed. There was diffuse, left maximal slowing overnight.   2. Second EEG on 2/6: This wasa moderately abnormal continuous video EEG due to diffuse, left maximal slowing and some runs of GRDA. This was indicative of a diffuse or multifocal cerebral disturbance. No further seizures were seen. 3. Third EEG on 2/8: This is a normal recording of the awake and drowsy states. 4. Mentation improved as noted above.     Recommendations: - Continue Vimpat 100 mg BID and valproic acid 500 mg TID - Continue seizure precautions -- Given normal EEG today, her waxing/waning mental status is most likely secondary to hospital delirium on possible baseline of undiagnosed/incipient dementia -- Neurology will sign off. Please call if there are additional questions.    Lilyan Gilford- Neurohospitalist, NP Electronically signed: Dr. Kerney Elbe 04/30/2018, 8:01 AM    Electronically signed: Dr. Kerney Elbe

## 2018-04-30 NOTE — Progress Notes (Signed)
Pharmacy Antibiotic Note  [name] [name] is a 85 y.o. female admitted on 04/26/2018 with seizures and concern for aspiration PNA.  Pharmacy has been consulted for Zosyn dosing.  Today is abx D#4, the patient is afebrile and WBC is wnl. Renal function is stable and current dosing remains appropriate. Consider adding a stop date for 5-7d, could consider narrowing to Unasyn.  Plan: - Continue Zosyn 3.375g IV every 8 hours - Consider adding stop date after 5-7d LOT - Could consider narrowing to Unasyn - Will continue to follow renal function, culture results, LOT, and antibiotic de-escalation plans   Height: 5\' 2"  (157.5 cm) Weight: 172 lb 6.4 oz (78.2 kg) IBW/kg (Calculated) : 50.1  Temp (24hrs), Avg:98.8 F (37.1 C), Min:98.7 F (37.1 C), Max:98.9 F (37.2 C)  Recent Labs  Lab 04/26/18 1747 04/26/18 1911 04/26/18 2256 04/27/18 0322 04/27/18 0328 04/27/18 1100 04/28/18 0502 04/29/18 0653 04/30/18 0849  WBC 6.3  --   --  13.6*  --   --  7.8 6.8  --   CREATININE 0.82  0.50  --   --  0.83  --   --  0.70 0.72 0.67  LATICACIDVEN  --  3.5* 4.3*  --  3.1* 1.5  --   --   --     Estimated Creatinine Clearance: 44.3 mL/min (by C-G formula based on SCr of 0.67 mg/dL).    Allergies  Allergen Reactions  . Fluorescein Anaphylaxis, Shortness Of Breath, Itching and Swelling  . Hydralazine Hcl Anaphylaxis, Swelling, Palpitations and Rash  . Sulfa Antibiotics Rash and Other (See Comments)    Other Reaction: red bumps  . Ultram [Tramadol] Hives  . Yellow Dye Anaphylaxis, Itching, Swelling and Other (See Comments)    Fluorescein (in eye drops)  . Buprenex [Buprenorphine Hcl] Palpitations and Other (See Comments)  . Keflex [Cephalexin] Diarrhea  . Lipitor [Atorvastatin] Other (See Comments)    Muscle weakness  . Restasis [Cyclosporine] Swelling    redness of face with slight swelling   . Suprax [Cefixime] Diarrhea  . Augmentin [Amoxicillin-Pot Clavulanate] Nausea And Vomiting  .  Levofloxacin Other (See Comments)    Reaction not recalled  . Morphine And Related Other (See Comments)    agitation   . Percocet [Oxycodone-Acetaminophen] Nausea And Vomiting  . Tape Other (See Comments)    Adhesive tape pulls off the skin Able to tolerate paper tape  . Zetia [Ezetimibe] Other (See Comments)    Myalgias    Thank you for allowing pharmacy to be a part of this patient's care.  Alycia Rossetti, PharmD, BCPS Clinical Pharmacist Clinical phone for 04/30/2018: [phone] 04/30/2018 11:05 AM   **Pharmacist phone directory can now be found on [email or web address] ([email or web address]).  Listed under [place].

## 2018-04-30 NOTE — Procedures (Signed)
  [place] A. Merlene Laughter, MD     [email or web address]           HISTORY: This is a 85 year old female who presents with recurrent seizures.  MEDICATIONS: Scheduled Meds: . enoxaparin (LOVENOX) injection  40 mg Subcutaneous QHS  . prednisoLONE acetate  1 drop Both Eyes QHS   Continuous Infusions: . 0.9 % NaCl with KCl 40 mEq / L 75 mL/hr at 04/30/18 1523  . diltiazem (CARDIZEM) infusion 12.5 mg/hr (04/30/18 1615)  . lacosamide (VIMPAT) IV Stopped (04/30/18 1015)  . piperacillin-tazobactam (ZOSYN)  IV 12.5 mL/hr at 04/30/18 1523  . valproate sodium 500 mg (04/30/18 0659)   PRN Meds:.acetaminophen **OR** acetaminophen, LORazepam, metoprolol tartrate, ondansetron **OR** ondansetron (ZOFRAN) IV     ANALYSIS: A 16 channel recording using standard 10 20 measurements is conducted for 20 minutes.  There is a well-formed posterior dominant rhythm of 8.5-9 hertz.  Beta activity is observed in the frontal areas.  Awake and drowsy activities are observed.  Photic stimulation and hyperventilation are not conducted.  There is no focal or lateralized slowing.  There is no epileptiform activity is observed.   IMPRESSION: This is a normal recording of the awake and drowsy states.      Carlen Fils A. Merlene Laughter, M.D.  Diplomate, Tax adviser of Psychiatry and Neurology ( Neurology).

## 2018-04-30 NOTE — Progress Notes (Signed)
PROGRESS NOTE    [name] [name]   PYK:[id number]  DOB: 03/17/1925  DOA: 04/26/2018 PCP: Darcus Austin, MD (Inactive)   Brief Narrative:  [name] [name]  is a 85 y.o. female with medical history significant of PAF, stroke, vascular dementia, blind in L eye, minimal vision in R, HTN who presented from home for a seizure (tonic clonic) noted to have another seizure by EMS and given Versed. She continued to have seizures with eyes being deviated to the left and was given Vimpat. Had another tonic clonic seizure and then given Depakote load after which her seizures resolved. She was noted to be subsequently lethargic and hypoxic. According to her daughter and her aid she had notable aspiration that morning at home when drinking water. She also has dementia and had been awake for about 2 days prior to this.   She was admitted from 04/21/18-04/22/18 for A-fib with RVR and was started on Cardizem.   Subjective: Per family she is waking up more. She has no complaints.    Assessment & Plan:   Principal Problem:   Non-convulsive status epilepticus  - CT head > Multiple stable chronic infarctions, microvascular ischemic changes, and volume loss of the brain. - CTA head/neck- no significant new stenosis - resolved after Depakote load- neuro following- on Vimpat and Depakote IV - no prior h/ o seizure but has had a CVA and was not sleeping lately which can precipitate seizures- may have also aspirated at home causing further stress - Neuro plans to resume EEG today  Active Problems: Lethargy - likely post ictal- may also be due to sedative effects of AED - follow - cont IVF while lethargic- still not awake enough to eat or take oral medications  Acute hypoxic respiratory failure, low grade fever 100.4, leukocytosis, hypotension (88/44) > sepsis - noted aspiration at home when she coughed multiple times on water- may have also aspirated during seizures - left perihilar opacity noted on CXR on  admission- on repeat CXR, this is noted to have resolved - she continues to have an O2 requirement and is on 4 L O2 with pulse ox of 94-97% - cont zosyn- MRSA PCR neg- d/c Vancomycin - blood cultures negative   Lactic acidosis - due to seizures and hypoxia- improved    History of Rt brain stroke Oct 2015     PAF (paroxysmal atrial fibrillation)   - hold Cardizem as lethargic- cont Cardizem infusion- due to SVT, I had to order an IV dose of Lopressor today- HR improved - takes Plavix only for prior CVA and is not on other anticoagulation- this is on hold due to lethargy    Blind left eye- (Fuch's disease)    Vascular dementia   - holding Aricept, Zoloft, Risperdal, Clonopin and Trazodone for now as she is not fully awake   Time spent in minutes: 35 min DVT prophylaxis: Lovenox Code Status: DNR Family Communication: daughter, son Disposition Plan: cont to follow for improvement  Consultants:   neuro Procedures:   EEG Antimicrobials:  Anti-infectives (From admission, onward)   Start     Dose/Rate Route Frequency Ordered Stop   04/28/18 0600  vancomycin (VANCOCIN) 500 mg in sodium chloride 0.9 % 100 mL IVPB  Status:  Discontinued     500 mg 100 mL/hr over 60 Minutes Intravenous Every 24 hours 04/27/18 0548 04/27/18 1727   04/27/18 0600  vancomycin (VANCOCIN) 1,250 mg in sodium chloride 0.9 % 250 mL IVPB     1,250 mg 166.7 mL/hr  over 90 Minutes Intravenous  Once 04/27/18 0548 04/27/18 0859   04/27/18 0600  ceFEPIme (MAXIPIME) 1 g in sodium chloride 0.9 % 100 mL IVPB  Status:  Discontinued     1 g 200 mL/hr over 30 Minutes Intravenous Every 12 hours 04/27/18 0548 04/27/18 0554   04/27/18 0600  piperacillin-tazobactam (ZOSYN) IVPB 3.375 g     3.375 g 12.5 mL/hr over 240 Minutes Intravenous Every 8 hours 04/27/18 0554         Objective: Vitals:   04/30/18 1103 04/30/18 1147 04/30/18 1216 04/30/18 1532  BP: 121/78 (!) 150/94 128/68 (!) 112/95  Pulse: 78 (!) 139 85 (!) 112   Resp: 19 16 (!) 21 14  Temp:  98.8 F (37.1 C)  99.8 F (37.7 C)  TempSrc:  Axillary  Axillary  SpO2: 96% 100% 97% 98%  Weight:      Height:        Intake/Output Summary (Last 24 hours) at 04/30/2018 1600 Last data filed at 04/30/2018 1523 Gross per 24 hour  Intake 3346.19 ml  Output 2401 ml  Net 945.19 ml   Filed Weights   04/26/18 1700 04/26/18 1835  Weight: 78.2 kg 78.2 kg    Examination: General exam: Appears comfortable  HEENT: PERRLA, oral mucosa moist, no sclera icterus or thrush Respiratory system: Clear to auscultation. Respiratory effort normal. Cardiovascular system: S1 & S2 heard,  No murmurs  Gastrointestinal system: Abdomen soft, non-tender, nondistended. Normal bowel sound. No organomegaly Central nervous system: Alert - difficult to awaken this AM- awakens after significant stimulation by being called and having shoulder rubbed- does not seem fully alert when awake and is repeating "OK" to all questions Extremities: No cyanosis, clubbing or edema Skin: No rashes or ulcers Psychiatry:  cannot assess    Data Reviewed: I have personally reviewed following labs and imaging studies  CBC: Recent Labs  Lab 04/26/18 1747 04/27/18 0322 04/28/18 0502 04/29/18 0653  WBC 6.3 13.6* 7.8 6.8  NEUTROABS 4.1 11.9*  --   --   HGB 13.4 13.0 12.9 13.7  HCT 43.7 41.0 40.6 42.9  MCV 97.5 93.4 95.5 93.5  PLT 209 194 163 229   Basic Metabolic Panel: Recent Labs  Lab 04/26/18 1747 04/27/18 0322 04/28/18 0502 04/29/18 0653 04/30/18 0849  NA 138 136 140 143 139  K 4.9 4.1 3.6 3.1* 2.7*  CL 102 100 104 103 99  CO2 18* 25 25 26 28   GLUCOSE 161* 136* 72 92 106*  BUN 18 9 15 17 9   CREATININE 0.82  0.50 0.83 0.70 0.72 0.67  CALCIUM 9.1 8.6* 8.6* 8.5* 8.5*  MG  --   --   --   --  2.0   GFR: Estimated Creatinine Clearance: 44.3 mL/min (by C-G formula based on SCr of 0.67 mg/dL). Liver Function Tests: Recent Labs  Lab 04/26/18 1747  AST 47*  ALT 18  ALKPHOS 86   BILITOT 1.5*  PROT 6.3*  ALBUMIN 3.5   No results for input(s): LIPASE, AMYLASE in the last 168 hours. No results for input(s): AMMONIA in the last 168 hours. Coagulation Profile: Recent Labs  Lab 04/26/18 1747  INR 1.08   Cardiac Enzymes: No results for input(s): CKTOTAL, CKMB, CKMBINDEX, TROPONINI in the last 168 hours. BNP (last 3 results) No results for input(s): PROBNP in the last 8760 hours. HbA1C: No results for input(s): HGBA1C in the last 72 hours. CBG: Recent Labs  Lab 04/26/18 1737  GLUCAP 137*   Lipid Profile: No  results for input(s): CHOL, HDL, LDLCALC, TRIG, CHOLHDL, LDLDIRECT in the last 72 hours. Thyroid Function Tests: No results for input(s): TSH, T4TOTAL, FREET4, T3FREE, THYROIDAB in the last 72 hours. Anemia Panel: No results for input(s): VITAMINB12, FOLATE, FERRITIN, TIBC, IRON, RETICCTPCT in the last 72 hours. Urine analysis:    Component Value Date/Time   COLORURINE YELLOW 04/21/2018 [place] 04/21/2018 1201   LABSPEC 1.009 04/21/2018 1201   PHURINE 6.0 04/21/2018 1201   GLUCOSEU NEGATIVE 04/21/2018 1201   HGBUR NEGATIVE 04/21/2018 1201   [place] 04/21/2018 [place] 04/21/2018 1201   PROTEINUR NEGATIVE 04/21/2018 1201   UROBILINOGEN 0.2 12/19/2014 1830   NITRITE NEGATIVE 04/21/2018 1201   LEUKOCYTESUR MODERATE (A) 04/21/2018 1201   Sepsis Labs: @LABRCNTIP (procalcitonin:4,lacticidven:4) ) Recent Results (from the past 240 hour(s))  Urine culture     Status: Abnormal   Collection Time: 04/21/18  2:00 PM  Result Value Ref Range Status   Specimen Description URINE, CLEAN CATCH  Final   Special Requests   Final    NONE Performed at [facility], [place] [address] [place], [place] [address]    Culture (A)  Final    20,000 COLONIES/mL MULTIPLE SPECIES PRESENT, SUGGEST RECOLLECTION   Report Status 04/22/2018 FINAL  Final  Culture, blood (routine x 2)     Status: None (Preliminary result)     Collection Time: 04/27/18  3:29 AM  Result Value Ref Range Status   Specimen Description BLOOD LEFT HAND  Final   Special Requests   Final    BOTTLES DRAWN AEROBIC AND ANAEROBIC Blood Culture adequate volume   Culture   Final    NO GROWTH 2 DAYS Performed at [facility], [place] [address] [place], [place] [address]    Report Status PENDING  Incomplete  Culture, blood (routine x 2)     Status: None (Preliminary result)   Collection Time: 04/27/18  3:36 AM  Result Value Ref Range Status   Specimen Description BLOOD RIGHT ARM  Final   Special Requests   Final    BOTTLES DRAWN AEROBIC AND ANAEROBIC Blood Culture adequate volume   Culture   Final    NO GROWTH 2 DAYS Performed at [facility], [address], [place], [place] [address]    Report Status PENDING  Incomplete  MRSA PCR Screening     Status: None   Collection Time: 04/27/18 10:41 AM  Result Value Ref Range Status   MRSA by PCR NEGATIVE NEGATIVE Final    Comment:        The GeneXpert MRSA Assay (FDA approved for NASAL specimens only), is one component of a comprehensive MRSA colonization surveillance program. It is not intended to diagnose MRSA infection nor to guide or monitor treatment for MRSA infections. Performed at [facility], [place] [address], [place], [place] [address]          Radiology Studies: Dg Chest Port 1 View  Result Date: 04/29/2018 CLINICAL DATA:  Hypoxia EXAM: PORTABLE CHEST 1 VIEW COMPARISON:  04/27/2018 FINDINGS: Heart size upper normal. Cardiac loop recorder. Ectatic thoracic aorta with calcification unchanged. Improved aeration in the left lung. Lungs are now clear without infiltrate or effusion. Negative for heart failure or edema. IMPRESSION: Interval clearing of left perihilar airspace disease. Lungs are now clear. Electronically Signed   By: Franchot Gallo M.D.   On: 04/29/2018 08:22      Scheduled Meds: . enoxaparin (LOVENOX) injection  40 mg Subcutaneous QHS  .  prednisoLONE acetate  1 drop Both Eyes QHS   Continuous Infusions: . 0.9 % NaCl with KCl 40 mEq / L 75 mL/hr at 04/30/18 1523  . diltiazem (CARDIZEM) infusion Stopped (04/30/18 1513)  . lacosamide (VIMPAT) IV Stopped (04/30/18 1015)  . piperacillin-tazobactam (ZOSYN)  IV 12.5 mL/hr at 04/30/18 1523  . potassium chloride 10 mEq (04/30/18 1513)  . valproate sodium 500 mg (04/30/18 0659)     LOS: 4 days      Debbe Odea, MD [facility] Pager: [email or web address] Password [email or web address] 04/30/2018, 4:00 PM

## 2018-05-01 LAB — BASIC METABOLIC PANEL
Anion gap: 13 (ref 5–15)
BUN: <5 mg/dL — ABNORMAL LOW (ref 8–23)
CO2: 27 mmol/L (ref 22–32)
Calcium: 8.7 mg/dL — ABNORMAL LOW (ref 8.9–10.3)
Chloride: 99 mmol/L (ref 98–111)
Creatinine, Ser: 0.62 mg/dL (ref 0.44–1.00)
GFR calc Af Amer: >60 mL/min (ref >60)
GFR calc non Af Amer: >60 mL/min (ref >60)
Glucose, Bld: 100 mg/dL — ABNORMAL HIGH (ref 70–99)
Potassium: 3 mmol/L — ABNORMAL LOW (ref 3.5–5.1)
Sodium: 139 mmol/L (ref 135–145)

## 2018-05-01 MED ORDER — CLOPIDOGREL BISULFATE 75 MG PO TABS
75.0000 mg | ORAL_TABLET | Freq: Every day | ORAL | Status: DC
  Administered 2018-05-01 – 2018-05-02 (×2): 75 mg via ORAL
  Filled 2018-05-01 (×2): qty 1

## 2018-05-01 MED ORDER — DILTIAZEM HCL ER COATED BEADS 180 MG PO CP24
180.0000 mg | ORAL_CAPSULE | Freq: Every day | ORAL | Status: DC
  Administered 2018-05-01 – 2018-05-06 (×3): 180 mg via ORAL
  Filled 2018-05-01 (×5): qty 1

## 2018-05-01 MED ORDER — RISPERIDONE 0.5 MG PO TABS
1.0000 mg | ORAL_TABLET | Freq: Every day | ORAL | Status: DC
  Administered 2018-05-01 – 2018-05-02 (×2): 1 mg via ORAL
  Filled 2018-05-01 (×2): qty 2

## 2018-05-01 MED ORDER — POTASSIUM CHLORIDE 10 MEQ/100ML IV SOLN
10.0000 meq | INTRAVENOUS | Status: AC
  Administered 2018-05-01 (×4): 10 meq via INTRAVENOUS
  Filled 2018-05-01 (×5): qty 100

## 2018-05-01 MED ORDER — POTASSIUM CHLORIDE CRYS ER 20 MEQ PO TBCR: 40.0000 meq | EXTENDED_RELEASE_TABLET | ORAL | Status: DC

## 2018-05-01 MED ORDER — POTASSIUM CHLORIDE 20 MEQ/15ML (10%) PO SOLN
40.0000 meq | ORAL | Status: AC
  Administered 2018-05-01 (×2): 40 meq via ORAL
  Filled 2018-05-01 (×2): qty 30

## 2018-05-01 NOTE — Progress Notes (Signed)
Rehab Admissions Coordinator Note:  Patient was screened by Retta Diones for appropriateness for an Inpatient Acute Rehab Consult.  At this time, we are recommending Inpatient Rehab consult.  Retta Diones 05/01/2018, 4:38 PM  I can be reached at [phone].

## 2018-05-01 NOTE — Evaluation (Signed)
Physical Therapy Evaluation Patient Details Name: [name] [name] MRN: [id number] DOB: Aug 15, 1924 Today's Date: 05/01/2018   History of Present Illness     85 y.o.femalewith medical history significant ofPAF, stroke, vascular dementia, blind in [place], minimal vision in R, HTN who presented from home for a seizure (tonic clonic) noted to have another seizure by EMS and given Versed. She continued to have seizures with eyes being deviated to the left and was given Vimpat. Had another tonic clonic seizure and then given Depakote load after which her seizures resolved. She was noted to be subsequently lethargic and hypoxic. According to her daughter and her aid she had notable aspiration that morning at home when drinking water. She also has dementia and had been awake for about 2 days prior to this.   She was admitted from 04/21/18-04/22/18 for A-fib with RVR and was started on Cardizem.   Clinical Impression  Pt presents with severe limitations to mobility due to weakness, decreased cognition, balance impairment, limited activity tolerance, cardiopulmonary dysfunction resulting in total dependency for changing positions or moving around.  Will benefit from postacute rehab to address deficits and restore pt to previous level of independence with walking and self-care activities (pt is blind, requires assist to lead but otherwise able to care for self prior to admission). Able to move pt to chair for upright trial at RN request; recommend SLP to assess swallowing as family reports at admission that pt aspirated.  Placed CIR screening c/s to assess for potential admission as pt was independent and has 24 care at d/c.  PT will initiate care in acute setting and assist with d/c as appropriate.  See below of details of exam findings and care plan for goals of care.     Follow Up Recommendations CIR;Supervision/Assistance - 24 hour    Equipment Recommendations  Hospital bed    Recommendations for Other  Services Rehab consult     Precautions / Restrictions Precautions Precautions: Fall Precaution Comments: blind, dependent transfers      Mobility  Bed Mobility Overal bed mobility: Needs Assistance Bed Mobility: Supine to Sit     Supine to sit: Total assist;+2 for safety/equipment;HOB elevated     General bed mobility comments: nurse in to manage multiple lines and leads, assisted with sitting balance (see below); pt minimally responsive and requires total assist to postion at EOB, trunk unstable and initially no righting reflex noted, but over time was able to maintain at least semi upright with left/posterior lean bias.  Resists forward lean  Transfers Overall transfer level: Needs assistance Equipment used: 1 person hand held assist Transfers: Sit to/from [facility] Sit to Stand: Total assist;+2 safety/equipment Stand pivot transfers: +2 safety/equipment;Total assist       General transfer comment: able to stand with pt contributing 5-10% effort max, noting knee/hip extensors activated, but unable to assume full load bearing and requires total assist for short duration stand (10 sec).  Nurse in room managing multiple lines, agreed to using lift for back to bed if PT moves pt to chair for sitting/upright trial.  Able to pivot to chair total assist, and positioned with pillows for upright support, noting left lean bias  Ambulation/Gait             General Gait Details: unable to initiate gait this session due to lethargy and profound weakness, but did note pt contributing 5% with small step to left during pivot transfer  Stairs  Wheelchair Mobility    Modified Rankin (Stroke Patients Only) Modified Rankin (Stroke Patients Only) Pre-Morbid Rankin Score: Moderate disability Modified Rankin: Severe disability     Balance Overall balance assessment: Needs assistance Sitting-balance support: Feet supported;Feet unsupported;No upper  extremity supported Sitting balance-Leahy Scale: Zero Sitting balance - Comments: occasionally able to maintain sitting upright without proximal trunk support but only for seconds of duration and with left/posterior lean; minimal protective reflexed note Postural control: Left lateral lean;Posterior lean Standing balance support: During functional activity Standing balance-Leahy Scale: Zero Standing balance comment: cannot maintain standing unsupported                             Pertinent Vitals/Pain Pain Assessment: Faces Faces Pain Scale: No hurt    Home Living Family/patient expects to be discharged to:: Private residence Living Arrangements: Children Available Help at Discharge: Family;Available 24 hours/day;Personal care attendant Type of Home: House Home Access: Stairs to enter   [employer] of Steps: 3 in front, ramp in back Home Layout: Multi-level Home Equipment: Bedside commode;Shower seat Additional Comments: split level, 7 up/6 down to get to bathroom, could set her up in downstairs, recommend hospital bed    Prior Function Level of Independence: Needs assistance   Gait / Transfers Assistance Needed: needs to be led due to vision deficits, but otherwise walks independently  ADL's / Homemaking Assistance Needed: was toileting independently and stepping into shower 1 week ago  Comments: has 24/7 caregivers     Hand Dominance   Dominant Hand: Right    Extremity/Trunk Assessment   Upper Extremity Assessment Upper Extremity Assessment: Defer to OT evaluation    Lower Extremity Assessment Lower Extremity Assessment: Generalized weakness;Difficult to assess due to impaired cognition       Communication   Communication: HOH  Cognition Arousal/Alertness: Lethargic Behavior During Therapy: Flat affect Overall Cognitive Status: History of cognitive impairments - at baseline                                 General Comments:  per family, pt is confused with disorganized speech at baseline; has had periods of lethargy this admission, seems more at baseline today according to daughter (in terms of cognition).  will respond to daughter's questions intermittently, and seems unable to follow commands consistently, either due to processing or weakness or both      General Comments General comments (skin integrity, edema, etc.): HR 100-130's throughout, RN monitoring consistently with no dramatic increased noted; daugther at bedside throughout to assist with PLOF and d/c planning; pt with several IV lines, monitoring ongoing, purewick, O2 in place.      Exercises     Assessment/Plan    PT Assessment Patient needs continued PT services  PT Problem List Cardiopulmonary status limiting activity;Decreased knowledge of use of DME;Decreased cognition;Decreased mobility;Decreased activity tolerance;Decreased strength;Decreased balance       PT Treatment Interventions Patient/family education;Balance training;Therapeutic exercise;Therapeutic activities;Functional mobility training;Gait training;DME instruction    PT Goals (Current goals can be found in the Care Plan section)  Acute Rehab PT Goals Patient Stated Goal: return home, able to climb stairs (per daughter) PT Goal Formulation: With family Time For Goal Achievement: 05/15/18 Potential to Achieve Goals: Fair    Frequency Min 3X/week   Barriers to discharge Inaccessible home environment split level home, though daughter can set pt up in downstairs if needed to be  near bathroom    Co-evaluation               AM-PAC PT "6 Clicks" Mobility  Outcome Measure Help needed turning from your back to your side while in a flat bed without using bedrails?: Total Help needed moving from lying on your back to sitting on the side of a flat bed without using bedrails?: Total Help needed moving to and from a bed to a chair (including a wheelchair)?: Total Help needed  standing up from a chair using your arms (e.g., wheelchair or bedside chair)?: Total Help needed to walk in hospital room?: Total Help needed climbing 3-5 steps with a railing? : Total 6 Click Score: 6    End of Session Equipment Utilized During Treatment: Gait belt;Oxygen Activity Tolerance: Patient limited by fatigue Patient left: in chair;with call bell/phone within reach;with nursing/sitter in room;with family/visitor present;with chair alarm set Nurse Communication: Mobility status;Need for lift equipment PT Visit Diagnosis: Muscle weakness (generalized) (M62.81);Difficulty in walking, not elsewhere classified (R26.2)    Time: 1610-9604 PT Time Calculation (min) (ACUTE ONLY): 48 min   Charges:   PT Evaluation $PT Eval High Complexity: 1 High PT Treatments $Therapeutic Activity: 38-52 mins       Kearney Hard, PT, DPT, MS Board Certified Geriatric Clinical Specialist   Herbie Drape 05/01/2018, 12:37 PM

## 2018-05-01 NOTE — Progress Notes (Signed)
Transferred the patient to the chair with the assistance of physical therapy. The patient was alert and communicative with her family and myself. Expressive aphasia noted, but the family says she has had expressive aphasia since her stroke in July of 2019. The patient is able to communicate when she has to go to the bathroom, and when she wants water or food. She remained alert and up in the chair for around 5 hours and then was transferred back to the bed. Education provided to family members about importance of proper positioning and nursing assistance when eating or drinking.

## 2018-05-01 NOTE — Progress Notes (Signed)
PROGRESS NOTE    [name] [name]   EZM:[id number]  DOB: January 29, 1925  DOA: 04/26/2018 PCP: Darcus Austin, MD (Inactive)   Brief Narrative:  [name] [name]  is a 86 y.o. female with medical history significant of PAF, stroke, vascular dementia, blind in L eye, minimal vision in R, HTN who presented from home for a seizure (tonic clonic) noted to have another seizure by EMS and given Versed. She continued to have seizures with eyes being deviated to the left and was given Vimpat. Had another tonic clonic seizure and then given Depakote load after which her seizures resolved. She was noted to be subsequently lethargic and hypoxic. According to her daughter and her aid she had notable aspiration that morning at home when drinking water. She also has dementia and had been awake for about 2 days prior to this.   She was admitted from 04/21/18-04/22/18 for A-fib with RVR and was started on Cardizem.   Subjective: Per family she is waking up more. Was communicating with me this AM. She had no complaints. Per RN who called me later, she was able to get out of bed into a chair and was much more alert.     Assessment & Plan:   Principal Problem:   Non-convulsive status epilepticus  - CT head > Multiple stable chronic infarctions, microvascular ischemic changes, and volume loss of the brain. - CTA head/neck- no significant new stenosis - resolved after Depakote load- neuro following- on Vimpat and Depakote IV - no prior h/ o seizure but has had a CVA and was not sleeping lately which can precipitate seizures- may have also aspirated at home causing further stress - seizure resolved  Active Problems: Lethargy - likely post ictal- may also be due to sedative effects of AED - follow - she is beginning to awaken now- will start diet and if able to tolerate enough liquids, will d/c IVF tomorrow  Acute hypoxic respiratory failure, low grade fever 100.4, leukocytosis, hypotension (88/44) > sepsis - noted  aspiration at home when she coughed multiple times on water- may have also aspirated during seizures - left perihilar opacity noted on CXR on admission- on repeat CXR, this is noted to have resolved  MRSA PCR neg- d/c'd Vancomycin - blood cultures negative  - she has received 5 day of Zosyn- will d/c today.   Lactic acidosis - due to seizures and hypoxia- improved    History of Rt brain stroke Oct 2015     PAF (paroxysmal atrial fibrillation)   - hold Cardizem as lethargic- cont Cardizem infusion- due to SVT, I had to order an IV dose of Lopressor today- HR improved - takes Plavix only for prior CVA and is not on other anticoagulation- this was on hold due to lethargy- will resume    Blind left eye- (Fuch's disease)    Vascular dementia   - due to lethargy, have been holding Aricept, Zoloft, Risperdal, Clonopin and Trazodone -she she is alert now and apparently was restless all of last night, will resume 1 mg of Risperdal QHS   Time spent in minutes: 35 min DVT prophylaxis: Lovenox Code Status: DNR Family Communication: daughter, son Disposition Plan: cont to follow for tolerance of diet Consultants:   neuro Procedures:   EEG Antimicrobials:  Anti-infectives (From admission, onward)   Start     Dose/Rate Route Frequency Ordered Stop   04/28/18 0600  vancomycin (VANCOCIN) 500 mg in sodium chloride 0.9 % 100 mL IVPB  Status:  Discontinued  500 mg 100 mL/hr over 60 Minutes Intravenous Every 24 hours 04/27/18 0548 04/27/18 1727   04/27/18 0600  vancomycin (VANCOCIN) 1,250 mg in sodium chloride 0.9 % 250 mL IVPB     1,250 mg 166.7 mL/hr over 90 Minutes Intravenous  Once 04/27/18 0548 04/27/18 0859   04/27/18 0600  ceFEPIme (MAXIPIME) 1 g in sodium chloride 0.9 % 100 mL IVPB  Status:  Discontinued     1 g 200 mL/hr over 30 Minutes Intravenous Every 12 hours 04/27/18 0548 04/27/18 0554   04/27/18 0600  piperacillin-tazobactam (ZOSYN) IVPB 3.375 g     3.375 g 12.5 mL/hr over  240 Minutes Intravenous Every 8 hours 04/27/18 0554         Objective: Vitals:   04/30/18 1952 04/30/18 2358 05/01/18 0738 05/01/18 1145  BP: 135/82 (!) 141/55 (!) 155/80 139/80  Pulse: (!) 104 98  (!) 128  Resp: (!) 24 (!) 22  19  Temp: 98.4 F (36.9 C) 98.5 F (36.9 C) 98.1 F (36.7 C) 98.6 F (37 C)  TempSrc: Axillary Axillary Axillary Axillary  SpO2: 98% 94%  97%  Weight:      Height:        Intake/Output Summary (Last 24 hours) at 05/01/2018 1427 Last data filed at 05/01/2018 1147 Gross per 24 hour  Intake 685.44 ml  Output 2350 ml  Net -1664.56 ml   Filed Weights   04/26/18 1700 04/26/18 1835  Weight: 78.2 kg 78.2 kg    Examination: General exam: Appears comfortable  HEENT: PERRLA, oral mucosa moist, no sclera icterus or thrush Respiratory system: Clear to auscultation. Respiratory effort normal. Cardiovascular system: S1 & S2 heard,  No murmurs  Gastrointestinal system: Abdomen soft, non-tender, nondistended. Normal bowel sound. No organomegaly Central nervous system: Alert  -  Not following commands Extremities: No cyanosis, clubbing or edema Skin: No rashes or ulcers   Data Reviewed: I have personally reviewed following labs and imaging studies  CBC: Recent Labs  Lab 04/26/18 1747 04/27/18 0322 04/28/18 0502 04/29/18 0653  WBC 6.3 13.6* 7.8 6.8  NEUTROABS 4.1 11.9*  --   --   HGB 13.4 13.0 12.9 13.7  HCT 43.7 41.0 40.6 42.9  MCV 97.5 93.4 95.5 93.5  PLT 209 194 163 235   Basic Metabolic Panel: Recent Labs  Lab 04/28/18 0502 04/29/18 0653 04/30/18 0849 04/30/18 1633 05/01/18 0632  NA 140 143 139 140 139  K 3.6 3.1* 2.7* 3.7 3.0*  CL 104 103 99 101 99  CO2 25 26 28 26 27   GLUCOSE 72 92 106* 101* 100*  BUN 15 17 9 10  <5*  CREATININE 0.70 0.72 0.67 0.62 0.62  CALCIUM 8.6* 8.5* 8.5* 8.7* 8.7*  MG  --   --  2.0  --   --    GFR: Estimated Creatinine Clearance: 44.3 mL/min (by C-G formula based on SCr of 0.62 mg/dL). Liver Function  Tests: Recent Labs  Lab 04/26/18 1747  AST 47*  ALT 18  ALKPHOS 86  BILITOT 1.5*  PROT 6.3*  ALBUMIN 3.5   No results for input(s): LIPASE, AMYLASE in the last 168 hours. No results for input(s): AMMONIA in the last 168 hours. Coagulation Profile: Recent Labs  Lab 04/26/18 1747  INR 1.08   Cardiac Enzymes: No results for input(s): CKTOTAL, CKMB, CKMBINDEX, TROPONINI in the last 168 hours. BNP (last 3 results) No results for input(s): PROBNP in the last 8760 hours. HbA1C: No results for input(s): HGBA1C in the last 72 hours.  CBG: Recent Labs  Lab 04/26/18 1737  GLUCAP 137*   Lipid Profile: No results for input(s): CHOL, HDL, LDLCALC, TRIG, CHOLHDL, LDLDIRECT in the last 72 hours. Thyroid Function Tests: No results for input(s): TSH, T4TOTAL, FREET4, T3FREE, THYROIDAB in the last 72 hours. Anemia Panel: No results for input(s): VITAMINB12, FOLATE, FERRITIN, TIBC, IRON, RETICCTPCT in the last 72 hours. Urine analysis:    Component Value Date/Time   COLORURINE YELLOW 04/21/2018 1201   APPEARANCEUR CLEAR 04/21/2018 1201   LABSPEC 1.009 04/21/2018 1201   PHURINE 6.0 04/21/2018 1201   GLUCOSEU NEGATIVE 04/21/2018 1201   HGBUR NEGATIVE 04/21/2018 1201   [place] 04/21/2018 [place] 04/21/2018 1201   PROTEINUR NEGATIVE 04/21/2018 1201   UROBILINOGEN 0.2 12/19/2014 1830   NITRITE NEGATIVE 04/21/2018 1201   LEUKOCYTESUR MODERATE (A) 04/21/2018 1201   Sepsis Labs: @LABRCNTIP (procalcitonin:4,lacticidven:4) ) Recent Results (from the past 240 hour(s))  Culture, blood (routine x 2)     Status: None (Preliminary result)   Collection Time: 04/27/18  3:29 AM  Result Value Ref Range Status   Specimen Description BLOOD LEFT HAND  Final   Special Requests   Final    BOTTLES DRAWN AEROBIC AND ANAEROBIC Blood Culture adequate volume   Culture   Final    NO GROWTH 3 DAYS Performed at [facility], [place] [address] [place], [place] [address]     Report Status PENDING  Incomplete  Culture, blood (routine x 2)     Status: None (Preliminary result)   Collection Time: 04/27/18  3:36 AM  Result Value Ref Range Status   Specimen Description BLOOD RIGHT ARM  Final   Special Requests   Final    BOTTLES DRAWN AEROBIC AND ANAEROBIC Blood Culture adequate volume   Culture   Final    NO GROWTH 3 DAYS Performed at [facility], [address] [place], [place] [address]    Report Status PENDING  Incomplete  MRSA PCR Screening     Status: None   Collection Time: 04/27/18 10:41 AM  Result Value Ref Range Status   MRSA by PCR NEGATIVE NEGATIVE Final    Comment:        The GeneXpert MRSA Assay (FDA approved for NASAL specimens only), is one component of a comprehensive MRSA colonization surveillance program. It is not intended to diagnose MRSA infection nor to guide or monitor treatment for MRSA infections. Performed at [facility], [place] [address] [place], [place] [address]          Radiology Studies: No results found.    Scheduled Meds: . enoxaparin (LOVENOX) injection  40 mg Subcutaneous QHS  . prednisoLONE acetate  1 drop Both Eyes QHS   Continuous Infusions: . 0.9 % NaCl with KCl 40 mEq / L 75 mL/hr (05/01/18 0512)  . diltiazem (CARDIZEM) infusion 15 mg/hr (05/01/18 0831)  . lacosamide (VIMPAT) IV 100 mg (05/01/18 0830)  . piperacillin-tazobactam (ZOSYN)  IV 3.375 g (05/01/18 0618)  . potassium chloride 10 mEq (05/01/18 1340)  . valproate sodium 500 mg (05/01/18 1338)     LOS: 5 days      Debbe Odea, MD [facility] Pager: [email or web address] Password [email or web address] 05/01/2018, 2:27 PM

## 2018-05-02 ENCOUNTER — Inpatient Hospital Stay (HOSPITAL_COMMUNITY): Payer: PPO

## 2018-05-02 DIAGNOSIS — R0682 Tachypnea, not elsewhere classified: Secondary | ICD-10-CM

## 2018-05-02 DIAGNOSIS — I4891 Unspecified atrial fibrillation: Secondary | ICD-10-CM

## 2018-05-02 DIAGNOSIS — Z8673 Personal history of transient ischemic attack (TIA), and cerebral infarction without residual deficits: Secondary | ICD-10-CM

## 2018-05-02 DIAGNOSIS — G40901 Epilepsy, unspecified, not intractable, with status epilepticus: Secondary | ICD-10-CM

## 2018-05-02 DIAGNOSIS — R1310 Dysphagia, unspecified: Secondary | ICD-10-CM

## 2018-05-02 DIAGNOSIS — Z8719 Personal history of other diseases of the digestive system: Secondary | ICD-10-CM

## 2018-05-02 DIAGNOSIS — I10 Essential (primary) hypertension: Secondary | ICD-10-CM

## 2018-05-02 DIAGNOSIS — H543 Unqualified visual loss, both eyes: Secondary | ICD-10-CM

## 2018-05-02 DIAGNOSIS — F0151 Vascular dementia with behavioral disturbance: Secondary | ICD-10-CM

## 2018-05-02 DIAGNOSIS — I480 Paroxysmal atrial fibrillation: Secondary | ICD-10-CM

## 2018-05-02 LAB — BASIC METABOLIC PANEL
Anion gap: 11 (ref 5–15)
BUN: 5 mg/dL — ABNORMAL LOW (ref 8–23)
CO2: 26 mmol/L (ref 22–32)
Calcium: 8.9 mg/dL (ref 8.9–10.3)
Chloride: 104 mmol/L (ref 98–111)
Creatinine, Ser: 0.61 mg/dL (ref 0.44–1.00)
GFR calc non Af Amer: >60 mL/min (ref >60)
Glucose, Bld: 106 mg/dL — ABNORMAL HIGH (ref 70–99)
Potassium: 4.2 mmol/L (ref 3.5–5.1)
SODIUM: 141 mmol/L (ref 135–145)

## 2018-05-02 LAB — CULTURE, BLOOD (ROUTINE X 2)
Culture: NO GROWTH
Culture: NO GROWTH
Special Requests: ADEQUATE
Special Requests: ADEQUATE

## 2018-05-02 LAB — HEPARIN LEVEL (UNFRACTIONATED): Heparin Unfractionated: 0.17 IU/mL — ABNORMAL LOW (ref 0.30–0.70)

## 2018-05-02 MED ORDER — HEPARIN (PORCINE) 25000 UT/250ML-% IV SOLN
1150.0000 [IU]/h | INTRAVENOUS | Status: DC
  Administered 2018-05-02: 950 [IU]/h via INTRAVENOUS
  Filled 2018-05-02 (×2): qty 250

## 2018-05-02 MED ORDER — HEPARIN BOLUS VIA INFUSION
3000.0000 [IU] | Freq: Once | INTRAVENOUS | Status: AC
  Administered 2018-05-02: 3000 [IU] via INTRAVENOUS
  Filled 2018-05-02: qty 3000

## 2018-05-02 MED ORDER — ENSURE ENLIVE PO LIQD
237.0000 mL | Freq: Two times a day (BID) | ORAL | Status: DC
  Administered 2018-05-02 – 2018-05-11 (×11): 237 mL via ORAL

## 2018-05-02 MED ORDER — FAMOTIDINE 20 MG PO TABS
20.0000 mg | ORAL_TABLET | Freq: Two times a day (BID) | ORAL | Status: DC
  Administered 2018-05-02 – 2018-05-06 (×4): 20 mg via ORAL
  Filled 2018-05-02 (×7): qty 1

## 2018-05-02 MED ORDER — HEPARIN BOLUS VIA INFUSION
2000.0000 [IU] | Freq: Once | INTRAVENOUS | Status: AC
  Administered 2018-05-02: 2000 [IU] via INTRAVENOUS
  Filled 2018-05-02: qty 2000

## 2018-05-02 MED ORDER — IOPAMIDOL (ISOVUE-370) INJECTION 76%
INTRAVENOUS | Status: AC
  Administered 2018-05-02: 14:00:00
  Filled 2018-05-02: qty 100

## 2018-05-02 MED ORDER — ACETAMINOPHEN 325 MG PO TABS
650.0000 mg | ORAL_TABLET | Freq: Once | ORAL | Status: AC
  Administered 2018-05-02: 650 mg via ORAL
  Filled 2018-05-02: qty 2

## 2018-05-02 NOTE — Progress Notes (Signed)
ANTICOAGULATION CONSULT NOTE - Follow-up Consult  Pharmacy Consult for Heparin Indication: multiple pulmonary emboli  Allergies  Allergen Reactions  . Fluorescein Anaphylaxis, Shortness Of Breath, Itching and Swelling  . Hydralazine Hcl Anaphylaxis, Swelling, Palpitations and Rash  . Sulfa Antibiotics Rash and Other (See Comments)    Other Reaction: red bumps  . Ultram [Tramadol] Hives  . Yellow Dye Anaphylaxis, Itching, Swelling and Other (See Comments)    Fluorescein (in eye drops)  . Buprenex [Buprenorphine Hcl] Palpitations and Other (See Comments)  . Keflex [Cephalexin] Diarrhea  . Lipitor [Atorvastatin] Other (See Comments)    Muscle weakness  . Restasis [Cyclosporine] Swelling    redness of face with slight swelling   . Suprax [Cefixime] Diarrhea  . Augmentin [Amoxicillin-Pot Clavulanate] Nausea And Vomiting  . Levofloxacin Other (See Comments)    Reaction not recalled  . Morphine And Related Other (See Comments)    agitation   . Percocet [Oxycodone-Acetaminophen] Nausea And Vomiting  . Tape Other (See Comments)    Adhesive tape pulls off the skin Able to tolerate paper tape  . Zetia [Ezetimibe] Other (See Comments)    Myalgias    Patient Measurements: Height: 5\' 2"  (157.5 cm) Weight: 172 lb 6.4 oz (78.2 kg) IBW/kg (Calculated) : 50.1 Heparin Dosing Weight: 68 kg  Vital Signs: Temp: 99.3 F (37.4 C) (02/10 1952) Temp Source: Oral (02/10 1952) BP: 119/72 (02/10 1952) Pulse Rate: 138 (02/10 1952)  Labs: Recent Labs    04/30/18 1633 05/01/18 3536 05/02/18 0323 05/02/18 2223  HEPARINUNFRC  --   --   --  0.17*  CREATININE 0.62 0.62 0.61  --     Estimated Creatinine Clearance: 44.3 mL/min (by C-G formula based on SCr of 0.61 mg/dL).  Assessment:  85 yr old female to begin IV heparin for multiple PEs per CT angiogram.  Hx atrial fibrillation and prior strokes, previously on Eliquis but stopped due to GI bleeding (09/2017) and has been on Plavix.  Plavix  now stopped, last dose this morning.  Heparin level subtherapeutic (0.17) on gtt at 950 units/hr. No issues with line or bleeding reported per RN.  Goal of Therapy:  Heparin level 0.3-0.7 units/ml Monitor platelets by anticoagulation protocol: Yes   Plan:  Rebolus heparin 2000 units IV bolus. Increase heparin drip to 1150 units/hr Will f/u 8 hr heparin level  Sherlon Handing, PharmD, BCPS Clinical pharmacist  **Pharmacist phone directory can now be found on [email or web address] ([email or web address]).  Listed under [place]. 05/02/2018,11:04 PM

## 2018-05-02 NOTE — Progress Notes (Addendum)
PROGRESS NOTE    [name] [name]   CBJ:[id number]  DOB: 1924-04-15  DOA: 04/26/2018 PCP: Darcus Austin, MD (Inactive)   Brief Narrative:  [name] [name]  is a 85 y.o. female with medical history significant of PAF, stroke, vascular dementia, blind in L eye, minimal vision in R, HTN who presented from home for a seizure (tonic clonic) noted to have another seizure by EMS and given Versed. She continued to have seizures with eyes being deviated to the left and was given Vimpat. Had another tonic clonic seizure and then given Depakote load after which her seizures resolved. She was noted to be subsequently lethargic and hypoxic. According to her daughter and her aid she had notable aspiration that morning at home when drinking water. She also has dementia and had been awake for about 2 days prior to this.   She was admitted from 04/21/18-04/22/18 for A-fib with RVR and was started on Cardizem.   Subjective: Not fully awake yet.     Assessment & Plan:   Principal Problem:   Non-convulsive status epilepticus  - CT head > Multiple stable chronic infarctions, microvascular ischemic changes, and volume loss of the brain. - CTA head/neck- no significant new stenosis - resolved after Depakote load- neuro following- on Vimpat and Depakote IV - no prior h/ o seizure but has had a CVA and was not sleeping lately which can precipitate seizures- may have also aspirated at home causing further stress - seizure resolved  Active Problems: Lethargy- deconditioning - likely post ictal- may also be due to sedative effects of AED - follow - she is beginning to awaken now  - PT recommends CIR  Acute hypoxic respiratory failure, low grade fever 100.4, leukocytosis, hypotension (88/44) > sepsis - noted aspiration at home when she coughed multiple times on water- may have also aspirated during seizures - left perihilar opacity noted on CXR on admission- on repeat CXR, this is noted to have resolved  MRSA PCR  neg- d/c'd Vancomycin - blood cultures negative  - she has received 5 day of Zosyn- last day 2/8 - her hypoxia has not resolved and she is still needed  3 L O2- CXR is unrevealing- will obtain CT of the chest today - of note, axillary temp was 99.5 today Addendum: received call from Radiologist- she has b/l PE with moderate clot burden and no cardiac strain. Will start Eliquis.   Lactic acidosis - due to seizures and hypoxia- improved    History of Rt brain stroke Oct 2015     PAF (paroxysmal atrial fibrillation)   - takes Plavix only for prior CVA and is not on other anticoagulation-  - IV Cardizem infusion switched back to oral Cardizem yesterday - she appears to be in sinus tachycardia today     Blind left eye- (Fuch's disease)    Vascular dementia   - due to lethargy, have been holding Aricept, Zoloft, Risperdal, Clonopin and Trazodone -she she is alert now and thus Risperdal resumed - will resume Aricept today  Assessment: patient has sinus tachycardia, ongoing hypoxia and low grade fever- will check CT chest as CXR is unrevealing  Time spent in minutes: 35 min DVT prophylaxis: Lovenox Code Status: DNR Family Communication: daughter, son Disposition Plan:   Consultants:   neuro Procedures:   EEG Antimicrobials:  Anti-infectives (From admission, onward)   Start     Dose/Rate Route Frequency Ordered Stop   04/28/18 0600  vancomycin (VANCOCIN) 500 mg in sodium chloride 0.9 % 100 mL  IVPB  Status:  Discontinued     500 mg 100 mL/hr over 60 Minutes Intravenous Every 24 hours 04/27/18 0548 04/27/18 1727   04/27/18 0600  vancomycin (VANCOCIN) 1,250 mg in sodium chloride 0.9 % 250 mL IVPB     1,250 mg 166.7 mL/hr over 90 Minutes Intravenous  Once 04/27/18 0548 04/27/18 0859   04/27/18 0600  ceFEPIme (MAXIPIME) 1 g in sodium chloride 0.9 % 100 mL IVPB  Status:  Discontinued     1 g 200 mL/hr over 30 Minutes Intravenous Every 12 hours 04/27/18 0548 04/27/18 0554   04/27/18  0600  piperacillin-tazobactam (ZOSYN) IVPB 3.375 g  Status:  Discontinued     3.375 g 12.5 mL/hr over 240 Minutes Intravenous Every 8 hours 04/27/18 0554 05/01/18 1429       Objective: Vitals:   05/02/18 0310 05/02/18 0804 05/02/18 0839 05/02/18 1202  BP:  (!) 153/90  111/76  Pulse:  (!) 137  (!) 121  Resp:  (!) 26  (!) 24  Temp: 98.6 F (37 C)  99.5 F (37.5 C) 99 F (37.2 C)  TempSrc: Axillary Axillary Axillary Axillary  SpO2:  94%  94%  Weight:      Height:        Intake/Output Summary (Last 24 hours) at 05/02/2018 1244 Last data filed at 05/02/2018 3329 Gross per 24 hour  Intake -  Output 2600 ml  Net -2600 ml   Filed Weights   04/26/18 1700 04/26/18 1835  Weight: 78.2 kg 78.2 kg    Examination: General exam: Appears comfortable  HEENT: PERRLA, oral mucosa moist, no sclera icterus or thrush Respiratory system: Clear to auscultation. Respiratory effort normal. Cardiovascular system: S1 & S2 heard,  No murmurs - sinus tachycardia Gastrointestinal system: Abdomen soft, non-tender, nondistended. Normal bowel sound. No organomegaly Central nervous system: Alert and oriented. No focal neurological deficits. Extremities: No cyanosis, clubbing or edema Skin: No rashes or ulcers Psychiatry:  Mood & affect appropriate.    Data Reviewed: I have personally reviewed following labs and imaging studies  CBC: Recent Labs  Lab 04/26/18 1747 04/27/18 0322 04/28/18 0502 04/29/18 0653  WBC 6.3 13.6* 7.8 6.8  NEUTROABS 4.1 11.9*  --   --   HGB 13.4 13.0 12.9 13.7  HCT 43.7 41.0 40.6 42.9  MCV 97.5 93.4 95.5 93.5  PLT 209 194 163 518   Basic Metabolic Panel: Recent Labs  Lab 04/29/18 0653 04/30/18 0849 04/30/18 1633 05/01/18 0632 05/02/18 0323  NA 143 139 140 139 141  K 3.1* 2.7* 3.7 3.0* 4.2  CL 103 99 101 99 104  CO2 26 28 26 27 26   GLUCOSE 92 106* 101* 100* 106*  BUN 17 9 10  <5* 5*  CREATININE 0.72 0.67 0.62 0.62 0.61  CALCIUM 8.5* 8.5* 8.7* 8.7* 8.9  MG   --  2.0  --   --   --    GFR: Estimated Creatinine Clearance: 44.3 mL/min (by C-G formula based on SCr of 0.61 mg/dL). Liver Function Tests: Recent Labs  Lab 04/26/18 1747  AST 47*  ALT 18  ALKPHOS 86  BILITOT 1.5*  PROT 6.3*  ALBUMIN 3.5   No results for input(s): LIPASE, AMYLASE in the last 168 hours. No results for input(s): AMMONIA in the last 168 hours. Coagulation Profile: Recent Labs  Lab 04/26/18 1747  INR 1.08   Cardiac Enzymes: No results for input(s): CKTOTAL, CKMB, CKMBINDEX, TROPONINI in the last 168 hours. BNP (last 3 results) No results for input(s):  PROBNP in the last 8760 hours. HbA1C: No results for input(s): HGBA1C in the last 72 hours. CBG: Recent Labs  Lab 04/26/18 1737  GLUCAP 137*   Lipid Profile: No results for input(s): CHOL, HDL, LDLCALC, TRIG, CHOLHDL, LDLDIRECT in the last 72 hours. Thyroid Function Tests: No results for input(s): TSH, T4TOTAL, FREET4, T3FREE, THYROIDAB in the last 72 hours. Anemia Panel: No results for input(s): VITAMINB12, FOLATE, FERRITIN, TIBC, IRON, RETICCTPCT in the last 72 hours. Urine analysis:    Component Value Date/Time   COLORURINE YELLOW 04/21/2018 1201   APPEARANCEUR CLEAR 04/21/2018 1201   LABSPEC 1.009 04/21/2018 1201   PHURINE 6.0 04/21/2018 1201   GLUCOSEU NEGATIVE 04/21/2018 1201   HGBUR NEGATIVE 04/21/2018 [place] 04/21/2018 [place] 04/21/2018 1201   PROTEINUR NEGATIVE 04/21/2018 1201   UROBILINOGEN 0.2 12/19/2014 1830   NITRITE NEGATIVE 04/21/2018 1201   LEUKOCYTESUR MODERATE (A) 04/21/2018 1201   Sepsis Labs: @LABRCNTIP (procalcitonin:4,lacticidven:4) ) Recent Results (from the past 240 hour(s))  Culture, blood (routine x 2)     Status: None (Preliminary result)   Collection Time: 04/27/18  3:29 AM  Result Value Ref Range Status   Specimen Description BLOOD LEFT HAND  Final   Special Requests   Final    BOTTLES DRAWN AEROBIC AND ANAEROBIC Blood  Culture adequate volume   Culture   Final    NO GROWTH 4 DAYS Performed at [facility], [place] [address], [place], [place] [address]    Report Status PENDING  Incomplete  Culture, blood (routine x 2)     Status: None (Preliminary result)   Collection Time: 04/27/18  3:36 AM  Result Value Ref Range Status   Specimen Description BLOOD RIGHT ARM  Final   Special Requests   Final    BOTTLES DRAWN AEROBIC AND ANAEROBIC Blood Culture adequate volume   Culture   Final    NO GROWTH 4 DAYS Performed at [facility], [place] [address] [place], [place] [address]    Report Status PENDING  Incomplete  MRSA PCR Screening     Status: None   Collection Time: 04/27/18 10:41 AM  Result Value Ref Range Status   MRSA by PCR NEGATIVE NEGATIVE Final    Comment:        The GeneXpert MRSA Assay (FDA approved for NASAL specimens only), is one component of a comprehensive MRSA colonization surveillance program. It is not intended to diagnose MRSA infection nor to guide or monitor treatment for MRSA infections. Performed at [facility], [place] [address] [place], [place] [address]          Radiology Studies: Dg Chest Port 1 View  Result Date: 05/02/2018 CLINICAL DATA:  New onset fever with tachycardia this morning. EXAM: PORTABLE CHEST 1 VIEW COMPARISON:  04/29/2018 FINDINGS: Patient is moderately rotated to the left with head/chin overlying the upper mediastinum and left apex. Lungs are adequately inflated without focal airspace consolidation or effusion. Cardiomediastinal silhouette and remainder of the exam is unchanged. IMPRESSION: No active disease. Electronically Signed   By: Marin Olp M.D.   On: 05/02/2018 08:58      Scheduled Meds: . clopidogrel  75 mg Oral Daily  . diltiazem  180 mg Oral Daily  . enoxaparin (LOVENOX) injection  40 mg Subcutaneous QHS  . prednisoLONE acetate  1 drop Both Eyes QHS  . risperiDONE  1 mg Oral QHS   Continuous Infusions: . lacosamide  (VIMPAT) IV 100 mg (  05/02/18 0810)  . valproate sodium 500 mg (05/02/18 0509)     LOS: 6 days      Debbe Odea, MD [facility] Pager: [email or web address] Password [facility] 05/02/2018, 12:44 PM

## 2018-05-02 NOTE — Progress Notes (Signed)
ANTICOAGULATION CONSULT NOTE - Initial Consult  Pharmacy Consult for Heparin Indication: multiple pulmonary emboli  Allergies  Allergen Reactions  . Fluorescein Anaphylaxis, Shortness Of Breath, Itching and Swelling  . Hydralazine Hcl Anaphylaxis, Swelling, Palpitations and Rash  . Sulfa Antibiotics Rash and Other (See Comments)    Other Reaction: red bumps  . Ultram [Tramadol] Hives  . Yellow Dye Anaphylaxis, Itching, Swelling and Other (See Comments)    Fluorescein (in eye drops)  . Buprenex [Buprenorphine Hcl] Palpitations and Other (See Comments)  . Keflex [Cephalexin] Diarrhea  . Lipitor [Atorvastatin] Other (See Comments)    Muscle weakness  . Restasis [Cyclosporine] Swelling    redness of face with slight swelling   . Suprax [Cefixime] Diarrhea  . Augmentin [Amoxicillin-Pot Clavulanate] Nausea And Vomiting  . Levofloxacin Other (See Comments)    Reaction not recalled  . Morphine And Related Other (See Comments)    agitation   . Percocet [Oxycodone-Acetaminophen] Nausea And Vomiting  . Tape Other (See Comments)    Adhesive tape pulls off the skin Able to tolerate paper tape  . Zetia [Ezetimibe] Other (See Comments)    Myalgias    Patient Measurements: Height: 5\' 2"  (157.5 cm) Weight: 172 lb 6.4 oz (78.2 kg) IBW/kg (Calculated) : 50.1 Heparin Dosing Weight: 68 kg  Vital Signs: Temp: 99 F (37.2 C) (02/10 1202) Temp Source: Axillary (02/10 1202) BP: 111/76 (02/10 1202) Pulse Rate: 121 (02/10 1202)  Labs: Recent Labs    04/30/18 1633 05/01/18 7001 05/02/18 0323  CREATININE 0.62 0.62 0.61    Estimated Creatinine Clearance: 44.3 mL/min (by C-G formula based on SCr of 0.61 mg/dL).   Medical History: Past Medical History:  Diagnosis Date  . Arthritis    "thumbs, joints" (03/23/2017  . Atrial fibrillation ([place]) 04/21/2018  . Basal cell carcinoma    "several; scattered over my face, hands, leg some cut off; some burned off"  . Blind left eye    a.  Corneal transplant x3 with rejection  . CAD (coronary artery disease)    a. s/p NSTEMI 2015 treated conservatively; nuclear stress test low risk. b. Continued angina despite med rx 07/2015 - s/p Bio Freedom Stent to ramus intermedius with mod LAD stenosis neg by FFR. // Nuc study 5/19:  EF 67, no ischemia or scar; Low Risk   . Complication of anesthesia    "very sensitive to RX"  . Diverticulitis large intestine   . Diverticulosis   . Family history of adverse reaction to anesthesia    "we all get PONV" (1/1/201)  . Gastritis   . GERD (gastroesophageal reflux disease)   . Hayfever   . Helicobacter pylori (H. pylori) 05/22/02   RUT-Positive  . Hypertension   . Myocardial infarction ([place]) 2015  . Squamous carcinoma    "several; scattered over my face, hands, leg some cut off; some burned off"  . Stroke [facility]) 2015   denies residual on 08/07/2015  . Stroke [facility]) 03/18/2017   "has effected her vision, balance, some memory" (03/23/2017)  . SVT (supraventricular tachycardia) (HCC)    a. seen by Dr. Caryl Comes - has loop recorder in. Patient has declined amiodarone due to side effect profile  . Varicose veins    Assessment:  85 yr old female to begin IV heparin for multiple PEs per CT angiogram.  Hx atrial fibrillation and prior strokes, previously on Eliquis but stopped due to GI bleeding (09/2017) and has been on Plavix.  Plavix now stopped, last dose this morning. Has  been on Lovenox 40 mg SQ q24hrs for VTE prophylaxis.   Last dose 2/9 ~9:30pm.  Lovenox stopped.  Pepcid added for GI prophylaxis.  Goal of Therapy:  Heparin level 0.3-0.7 units/ml Monitor platelets by anticoagulation protocol: Yes   Plan:   Heparin 3000 units IV bolus.  Heparin drip to begin at 950 units/hr  Heparin level ~8 hrs after drip begins.  Daily heparin level and CBC.  Monitor for any s/sx bleeding.  Arty Baumgartner, [place] Pager: [phone] or phone: [phone] 05/02/2018,2:31 PM

## 2018-05-02 NOTE — Consult Note (Signed)
Physical Medicine and Rehabilitation Consult Reason for Consult:  Decreased functional mobility Referring Physician: [facility]   HPI: [name] [name] is a 85 y.o.right handed female with history of PAF not on anticoagulation, CVA January 2019 and received inpatient rehabilitation services with loop recorder and had been placed on Eliquis but discontinued due to GI bleed and later with Plavix initiated, vascular dementia maintained on Aricept as well as Risperdal,, blindness left eye, right eye cortical blindness, CAD.  History taken from chart review and caregiver, patient with recent admission 04/21/2020 04/22/2018 for episode of atrial fibrillation with RVR placed on Cardizem.  Per chart review, patient lives with her children as well as a personal care attendant. Multilevel home with 7 steps up to the bathroom. She ambulates independently but needed assistance due to visual deficits. Presented to 07/11/2018 with seizure as well as 2 more in the ED. Cranial CT scan reviewed, showing old infarct.  Per report and CT cerebral perfusion scan with no acute abnormality. EEG was negative. CT angiogram of head and neck with no emergent large vessel occlusion. Patient had been loaded with Vimpat as well as valproate for seizure. Currently remains on Plavix for CVA prophylaxis. Subcutaneous Lovenox added for DVT prophylaxis. Dysphagia #1 thin liquid diet.therapy evaluations completed with recommendations of physical medicine rehabilitation consult.  Review of Systems  Unable to perform ROS: Patient nonverbal   Past Medical History:  Diagnosis Date  . Arthritis    "thumbs, joints" (03/23/2017  . Atrial fibrillation ([place]) 04/21/2018  . Basal cell carcinoma    "several; scattered over my face, hands, leg some cut off; some burned off"  . Blind left eye    a. Corneal transplant x3 with rejection  . CAD (coronary artery disease)    a. s/p NSTEMI 2015 treated conservatively; nuclear stress test low risk. b.  Continued angina despite med rx 07/2015 - s/p Bio Freedom Stent to ramus intermedius with mod LAD stenosis neg by FFR. // Nuc study 5/19:  EF 67, no ischemia or scar; Low Risk   . Complication of anesthesia    "very sensitive to RX"  . Diverticulitis large intestine   . Diverticulosis   . Family history of adverse reaction to anesthesia    "we all get PONV" (1/1/201)  . Gastritis   . GERD (gastroesophageal reflux disease)   . Hayfever   . Helicobacter pylori (H. pylori) 05/22/02   RUT-Positive  . Hypertension   . Myocardial infarction ([place]) 2015  . Squamous carcinoma    "several; scattered over my face, hands, leg some cut off; some burned off"  . Stroke [facility]) 2015   denies residual on 08/07/2015  . Stroke [facility]) 03/18/2017   "has effected her vision, balance, some memory" (03/23/2017)  . SVT (supraventricular tachycardia) (HCC)    a. seen by Dr. Caryl Comes - has loop recorder in. Patient has declined amiodarone due to side effect profile  . Varicose veins    Past Surgical History:  Procedure Laterality Date  . BASAL CELL CARCINOMA EXCISION     "several; scattered over my face, hands, leg some cut off; some burned off"  . CARDIAC CATHETERIZATION N/A 08/07/2015   Procedure: Left Heart Cath and Coronary Angiography;  Surgeon: [name], MD;  Location: [place] CV LAB;  Service: Cardiovascular;  Laterality: N/A;  . CARDIAC CATHETERIZATION N/A 08/07/2015   Procedure: Coronary Stent Intervention;  Surgeon: [name], MD;  Location: [place] CV LAB;  Service: Cardiovascular;  Laterality: N/A;  .  CARDIAC CATHETERIZATION N/A 08/07/2015   Procedure: Intravascular Pressure Wire/FFR Study;  Surgeon: [name], MD;  Location: [place] CV LAB;  Service: Cardiovascular;  Laterality: N/A;  . CATARACT EXTRACTION W/ INTRAOCULAR LENS  IMPLANT, BILATERAL Bilateral 2005  . CLOSED REDUCTION SHOULDER DISLOCATION Left 2016 X 2  . CORNEAL TRANSPLANT Bilateral right 2009, left 2009    3 in left  eye (last 2 failed), 1 in right eye  . DILATION AND CURETTAGE OF UTERUS    . EYE SURGERY    . JOINT REPLACEMENT    . [place]   "had gangrene in it"  . LOOP RECORDER IMPLANT N/A 01/05/2014   Procedure: LOOP RECORDER IMPLANT;  Surgeon: Coralyn Mark, MD;  Location: [place] CATH LAB;  Service: Cardiovascular;  Laterality: N/A;  . SQUAMOUS CELL CARCINOMA EXCISION     "several; scattered over my face, hands, leg some cut off; some burned off"  . TONSILLECTOMY    . TOTAL ABDOMINAL HYSTERECTOMY  11/1983   "ovaries and all"  . TOTAL KNEE ARTHROPLASTY Left 06/27/2012   Procedure: LEFT TOTAL KNEE ARTHROPLASTY;  Surgeon: Gearlean Alf, MD;  Location: [facility];  Service: Orthopedics;  Laterality: Left;  . TOTAL KNEE ARTHROPLASTY Right 12/12/2012   Procedure: RIGHT TOTAL KNEE ARTHROPLASTY;  Surgeon: Gearlean Alf, MD;  Location: [facility];  Service: Orthopedics;  Laterality: Right;  . TUBAL LIGATION  1966   Family History  Problem Relation Age of Onset  . Stroke Mother   . Heart attack Father   . Heart attack Brother   . Cancer Brother   . Heart attack Brother   . Heart attack Brother   . Heart attack Brother   . Parkinsonism Brother    Social History:  reports that she has never smoked. She has never used smokeless tobacco. She reports that she does not drink alcohol or use drugs. Allergies:  Allergies  Allergen Reactions  . Fluorescein Anaphylaxis, Shortness Of Breath, Itching and Swelling  . Hydralazine Hcl Anaphylaxis, Swelling, Palpitations and Rash  . Sulfa Antibiotics Rash and Other (See Comments)    Other Reaction: red bumps  . Ultram [Tramadol] Hives  . Yellow Dye Anaphylaxis, Itching, Swelling and Other (See Comments)    Fluorescein (in eye drops)  . Buprenex [Buprenorphine Hcl] Palpitations and Other (See Comments)  . Keflex [Cephalexin] Diarrhea  . Lipitor [Atorvastatin] Other (See Comments)    Muscle weakness  . Restasis [Cyclosporine] Swelling     redness of face with slight swelling   . Suprax [Cefixime] Diarrhea  . Augmentin [Amoxicillin-Pot Clavulanate] Nausea And Vomiting  . Levofloxacin Other (See Comments)    Reaction not recalled  . Morphine And Related Other (See Comments)    agitation   . Percocet [Oxycodone-Acetaminophen] Nausea And Vomiting  . Tape Other (See Comments)    Adhesive tape pulls off the skin Able to tolerate paper tape  . Zetia [Ezetimibe] Other (See Comments)    Myalgias   Medications Prior to Admission  Medication Sig Dispense Refill  . clonazePAM (KLONOPIN) 0.5 MG tablet Take 0.25-5 mg by mouth See admin instructions. Take 1/2 tablet (0.25mg ) in the morning, and 1 tablet (.5mg ) at night May take additional 1/2 tablet (.25mg ) in the afternoon if needed    . clopidogrel (PLAVIX) 75 MG tablet Take 1 tablet (75 mg total) by mouth daily. (Patient taking differently: Take 75 mg by mouth every morning. ) 90 tablet 3  . diltiazem (CARDIZEM CD) 180 MG 24 hr  capsule Take 1 capsule (180 mg total) by mouth daily. 30 capsule 0  . donepezil (ARICEPT) 5 MG tablet Take 7.5 mg by mouth every morning.     . nitroGLYCERIN (NITROSTAT) 0.4 MG SL tablet Place 1 tablet (0.4 mg total) under the tongue every 5 (five) minutes as needed for chest pain. 25 tablet 2  . prednisoLONE acetate (PRED FORTE) 1 % ophthalmic suspension Place 1 drop into both eyes See admin instructions. 1 drop into both eyes at bedtime spaced 5 minutes apart from Systane gel    . risperiDONE (RISPERDAL) 2 MG tablet Take 2 mg by mouth at bedtime.     . sertraline (ZOLOFT) 100 MG tablet Take 150 mg by mouth every morning.   1  . traZODone (DESYREL) 50 MG tablet Take 25 mg by mouth at bedtime.    . cefixime (SUPRAX) 400 MG CAPS capsule Take 1 capsule (400 mg total) by mouth daily. (Patient not taking: Reported on 04/26/2018) 2 capsule 0    Home: Home Living Family/patient expects to be discharged to:: Private residence Living Arrangements: Children Available  Help at Discharge: Family, Available 24 hours/day, Personal care attendant Type of Home: House Home Access: Stairs to enter [employer] of Steps: 3 in front, ramp in back Home Layout: Multi-level Alternate Level Stairs-Number of Steps: 1 flight Alternate Level Stairs-Rails: Right Bathroom Shower/Tub: Tub/shower unit, Multimedia programmer: Standard Home Equipment: Bedside commode, Shower seat Additional Comments: split level, 7 up/6 down to get to bathroom, could set her up in downstairs, recommend hospital bed  Functional History: Prior Function Level of Independence: Needs assistance Gait / Transfers Assistance Needed: needs to be led due to vision deficits, but otherwise walks independently ADL's / Homemaking Assistance Needed: was toileting independently and stepping into shower 1 week ago Comments: has 24/7 caregivers Functional Status:  Mobility: Bed Mobility Overal bed mobility: Needs Assistance Bed Mobility: Supine to Sit Supine to sit: Total assist, +2 for safety/equipment, HOB elevated General bed mobility comments: nurse in to manage multiple lines and leads, assisted with sitting balance (see below); pt minimally responsive and requires total assist to postion at EOB, trunk unstable and initially no righting reflex noted, but over time was able to maintain at least semi upright with left/posterior lean bias.  Resists forward lean Transfers Overall transfer level: Needs assistance Equipment used: 1 person hand held assist Transfers: Sit to/from Stand, Stand Pivot Transfers Sit to Stand: Total assist, +2 safety/equipment Stand pivot transfers: +2 safety/equipment, Total assist General transfer comment: able to stand with pt contributing 5-10% effort max, noting knee/hip extensors activated, but unable to assume full load bearing and requires total assist for short duration stand (10 sec).  Nurse in room managing multiple lines, agreed to using lift for back  to bed if PT moves pt to chair for sitting/upright trial.  Able to pivot to chair total assist, and positioned with pillows for upright support, noting left lean bias Ambulation/Gait General Gait Details: unable to initiate gait this session due to lethargy and profound weakness, but did note pt contributing 5% with small step to left during pivot transfer    ADL:    Cognition: Cognition Overall Cognitive Status: History of cognitive impairments - at baseline Orientation Level: Oriented to person, Disoriented to place, Disoriented to time, Disoriented to situation Cognition Arousal/Alertness: Lethargic Behavior During Therapy: Flat affect Overall Cognitive Status: History of cognitive impairments - at baseline General Comments: per family, pt is confused with disorganized speech at baseline; has  had periods of lethargy this admission, seems more at baseline today according to daughter (in terms of cognition).  will respond to daughter's questions intermittently, and seems unable to follow commands consistently, either due to processing or weakness or both  Blood pressure (!) 155/82, pulse (!) 130, temperature 98.6 F (37 C), temperature source Axillary, resp. rate (!) 21, height 5\' 2"  (1.575 m), weight 78.2 kg, SpO2 97 %. Physical Exam  Vitals reviewed. Constitutional: She appears well-developed.  Obese  HENT:  Head: Normocephalic and atraumatic.  Eyes: Right eye exhibits no discharge. Left eye exhibits no discharge.  Keeps eyes closed  Neck: Normal range of motion. Neck supple. No thyromegaly present.  Cardiovascular:  Irregularly irregular with tachycardia  Respiratory: Breath sounds normal. No respiratory distress.  + [place] Tachypnea  GI: Soft. Bowel sounds are normal. She exhibits no distension.  Musculoskeletal:     Comments: No edema or tenderness in extremities  Neurological:  Patient resting comfortably with caregiver at bedside.  Alert, but keeps eyes closed  Motor:  Limited due to participation, but moving both hands.  Appears to be restless and agitated.  Skin: Skin is warm and dry.  Psychiatric:  Unable to assess due to mentation    Results for orders placed or performed during the hospital encounter of 04/26/18 (from the past 24 hour(s))  Basic metabolic panel     Status: Abnormal   Collection Time: 05/01/18  6:32 AM  Result Value Ref Range   Sodium 139 135 - 145 mmol/L   Potassium 3.0 (L) 3.5 - 5.1 mmol/L   Chloride 99 98 - 111 mmol/L   CO2 27 22 - 32 mmol/L   Glucose, Bld 100 (H) 70 - 99 mg/dL   BUN <5 (L) 8 - 23 mg/dL   Creatinine, Ser 0.62 0.44 - 1.00 mg/dL   Calcium 8.7 (L) 8.9 - 10.3 mg/dL   GFR calc non Af Amer >60 >60 mL/min   GFR calc Af Amer >60 >60 mL/min   Anion gap 13 5 - 15  Basic metabolic panel     Status: Abnormal   Collection Time: 05/02/18  3:23 AM  Result Value Ref Range   Sodium 141 135 - 145 mmol/L   Potassium 4.2 3.5 - 5.1 mmol/L   Chloride 104 98 - 111 mmol/L   CO2 26 22 - 32 mmol/L   Glucose, Bld 106 (H) 70 - 99 mg/dL   BUN 5 (L) 8 - 23 mg/dL   Creatinine, Ser 0.61 0.44 - 1.00 mg/dL   Calcium 8.9 8.9 - 10.3 mg/dL   GFR calc non Af Amer >60 >60 mL/min   GFR calc Af Amer >60 >60 mL/min   Anion gap 11 5 - 15   No results found.  Assessment/Plan: Diagnosis: Seizures Labs independently reviewed.  Records reviewed and summated above.  1. Does the need for close, 24 hr/day medical supervision in concert with the patient's rehab needs make it unreasonable for this patient to be served in a less intensive setting? Yes  2. Co-Morbidities requiring supervision/potential complications:  PAF with rapid ventricular response not on anticoagulation (monitor HR with increased activity), CVA January 2019, history of GI bleed, vascular dementia maintained (continue meds) blindness left eye, right eye cortical blindness, CAD, Dysphagia (advance diet as tolerated), tachypnea (monitor RR and O2 Sats with increased physical  exertion) 3. Due to bladder management, bowel management, safety, skin/wound care, disease management, medication administration and patient education, does the patient require 24 hr/day rehab nursing? Yes  4. Does the patient require coordinated care of a physician, rehab nurse, PT (1-2 hrs/day, 5 days/week), OT (1-2 hrs/day, 5 days/week) and SLP (1-2 hrs/day, 5 days/week) to address physical and functional deficits in the context of the above medical diagnosis(es)? Yes Addressing deficits in the following areas: balance, endurance, locomotion, strength, transferring, bowel/bladder control, bathing, dressing, feeding, grooming, toileting, cognition, speech, language, swallowing and psychosocial support 5. Can the patient actively participate in an intensive therapy program of at least 3 hrs of therapy per day at least 5 days per week? Potentially 6. The potential for patient to make measurable gains while on inpatient rehab is excellent 7. Anticipated functional outcomes upon discharge from inpatient rehab are mod assist and max assist  with PT, mod assist and max assist with OT, mod assist and max assist with SLP. 8. Estimated rehab length of stay to reach the above functional goals is: 20-24 days. 9. Anticipated D/C setting: Other 10. Anticipated post D/C treatments: HH therapy and Home excercise program 11. Overall Rehab/Functional Prognosis: good  RECOMMENDATIONS: This patient's condition is appropriate for continued rehabilitative care in the following setting: Given recent seizures, baseline aphasia, as well as deficits seen on imaging, anticipate prolonged recovery course.  We have significant caregiver support available on discharge recommend CIR when medically stable and able to tolerate 3 hours of therapy per day. Patient has agreed to participate in recommended program. Potentially Note that insurance prior authorization may be required for reimbursement for recommended care.  Comment:  Rehab Admissions Coordinator to follow up.   I have personally performed a face to face diagnostic evaluation, including, but not limited to relevant history and physical exam findings, of this patient and developed relevant assessment and plan.  Additionally, I have reviewed and concur with the physician assistant's documentation above.   Delice Lesch, MD, ABPMR Lavon Paganini Angiulli, PA-C 05/02/2018

## 2018-05-03 ENCOUNTER — Inpatient Hospital Stay (HOSPITAL_COMMUNITY): Payer: PPO

## 2018-05-03 DIAGNOSIS — I82409 Acute embolism and thrombosis of unspecified deep veins of unspecified lower extremity: Secondary | ICD-10-CM

## 2018-05-03 LAB — CBC
HCT: 43.2 % (ref 36.0–46.0)
HCT: 45.2 % (ref 36.0–46.0)
Hemoglobin: 14.2 g/dL (ref 12.0–15.0)
Hemoglobin: 14 g/dL (ref 12.0–15.0)
MCH: 30.2 pg (ref 26.0–34.0)
MCH: 29 pg (ref 26.0–34.0)
MCHC: 32.9 g/dL (ref 30.0–36.0)
MCHC: 31 g/dL (ref 30.0–36.0)
MCV: 91.9 fL (ref 80.0–100.0)
MCV: 93.8 fL (ref 80.0–100.0)
NRBC: 0 % (ref 0.0–0.2)
Platelets: 170 10*3/uL (ref 150–400)
Platelets: 151 10*3/uL (ref 150–400)
RBC: 4.7 MIL/uL (ref 3.87–5.11)
RBC: 4.82 MIL/uL (ref 3.87–5.11)
RDW: 13.9 % (ref 11.5–15.5)
RDW: 14 % (ref 11.5–15.5)
WBC: 10.6 10*3/uL — ABNORMAL HIGH (ref 4.0–10.5)
WBC: 10.2 10*3/uL (ref 4.0–10.5)
nRBC: 0 % (ref 0.0–0.2)

## 2018-05-03 LAB — BLOOD GAS, ARTERIAL
ACID-BASE EXCESS: 6.1 mmol/L — AB (ref 0.0–2.0)
Bicarbonate: 29.9 mmol/L — ABNORMAL HIGH (ref 20.0–28.0)
DRAWN BY: 548791
O2 Content: 2 L/min
O2 Saturation: 93.7 %
Patient temperature: 98.6
pCO2 arterial: 42.2 mmHg (ref 32.0–48.0)
pH, Arterial: 7.465 — ABNORMAL HIGH (ref 7.350–7.450)
pO2, Arterial: 68.6 mmHg — ABNORMAL LOW (ref 83.0–108.0)

## 2018-05-03 LAB — BASIC METABOLIC PANEL
Anion gap: 12 (ref 5–15)
BUN: 17 mg/dL (ref 8–23)
CO2: 26 mmol/L (ref 22–32)
CREATININE: 0.72 mg/dL (ref 0.44–1.00)
Calcium: 9.1 mg/dL (ref 8.9–10.3)
Chloride: 103 mmol/L (ref 98–111)
GFR calc Af Amer: >60 mL/min (ref >60)
GFR calc non Af Amer: >60 mL/min (ref >60)
Glucose, Bld: 138 mg/dL — ABNORMAL HIGH (ref 70–99)
Potassium: 3.9 mmol/L (ref 3.5–5.1)
Sodium: 141 mmol/L (ref 135–145)

## 2018-05-03 LAB — HEPARIN LEVEL (UNFRACTIONATED)
HEPARIN UNFRACTIONATED: 2.1 [IU]/mL — AB (ref 0.30–0.70)
Heparin Unfractionated: >2.2 IU/mL — ABNORMAL HIGH (ref 0.30–0.70)
Heparin Unfractionated: 0.42 IU/mL (ref 0.30–0.70)

## 2018-05-03 MED ORDER — PIPERACILLIN-TAZOBACTAM 3.375 G IVPB
3.3750 g | Freq: Three times a day (TID) | INTRAVENOUS | Status: DC
  Administered 2018-05-03 – 2018-05-04 (×4): 3.375 g via INTRAVENOUS
  Filled 2018-05-03 (×6): qty 50

## 2018-05-03 MED ORDER — DILTIAZEM HCL-DEXTROSE 100-5 MG/100ML-% IV SOLN (PREMIX)
5.0000 mg/h | INTRAVENOUS | Status: DC
  Administered 2018-05-03: 5 mg/h via INTRAVENOUS
  Filled 2018-05-03: qty 100

## 2018-05-03 MED ORDER — KCL IN DEXTROSE-NACL 20-5-0.9 MEQ/L-%-% IV SOLN
INTRAVENOUS | Status: DC
  Administered 2018-05-03: 20:00:00 via INTRAVENOUS
  Filled 2018-05-03 (×3): qty 1000

## 2018-05-03 MED ORDER — HEPARIN (PORCINE) 25000 UT/250ML-% IV SOLN
950.0000 [IU]/h | INTRAVENOUS | Status: DC
  Administered 2018-05-03 – 2018-05-05 (×2): 950 [IU]/h via INTRAVENOUS
  Filled 2018-05-03 (×4): qty 250

## 2018-05-03 NOTE — Progress Notes (Addendum)
ANTICOAGULATION + ANTIBIOTIC CONSULT NOTE - Follow Up Consult  Pharmacy Consult for Heparin and Zosyn Indication: pulmonary embolus and aspiration pneumonia  Allergies  Allergen Reactions  . Fluorescein Anaphylaxis, Shortness Of Breath, Itching and Swelling  . Hydralazine Hcl Anaphylaxis, Swelling, Palpitations and Rash  . Sulfa Antibiotics Rash and Other (See Comments)    Other Reaction: red bumps  . Ultram [Tramadol] Hives  . Yellow Dye Anaphylaxis, Itching, Swelling and Other (See Comments)    Fluorescein (in eye drops)  . Buprenex [Buprenorphine Hcl] Palpitations and Other (See Comments)  . Keflex [Cephalexin] Diarrhea  . Lipitor [Atorvastatin] Other (See Comments)    Muscle weakness  . Restasis [Cyclosporine] Swelling    redness of face with slight swelling   . Suprax [Cefixime] Diarrhea  . Augmentin [Amoxicillin-Pot Clavulanate] Nausea And Vomiting  . Levofloxacin Other (See Comments)    Reaction not recalled  . Morphine And Related Other (See Comments)    agitation   . Percocet [Oxycodone-Acetaminophen] Nausea And Vomiting  . Tape Other (See Comments)    Adhesive tape pulls off the skin Able to tolerate paper tape  . Zetia [Ezetimibe] Other (See Comments)    Myalgias    Patient Measurements: Height: 5\' 2"  (157.5 cm) Weight: 172 lb 6.4 oz (78.2 kg) IBW/kg (Calculated) : 50.1 Heparin Dosing Weight: 68 kg  Vital Signs: Temp: 99.7 F (37.6 C) (02/11 1129) Temp Source: Rectal (02/11 1129) BP: 128/80 (02/11 0700) Pulse Rate: 134 (02/11 0316)  Labs: Recent Labs    05/01/18 9450 05/02/18 0323 05/02/18 2223 05/03/18 0642 05/03/18 0941  HGB  --   --   --  14.2  --   HCT  --   --   --  43.2  --   PLT  --   --   --  170  --   HEPARINUNFRC  --   --  0.17* >2.20* 2.10*  CREATININE 0.62 0.61  --  0.72  --     Estimated Creatinine Clearance: 44.3 mL/min (by C-G formula based on SCr of 0.72 mg/dL).  Assessment:   85 yr old female started on IV heparin on 2/10  for multiple PEs per CT angiogram.  Hx atrial fibrillation and prior strokes, previously on Eliquis but stopped due to GI bleeding (09/2017) and had been on Plavix, stopped 2/10.      Initial heparin level subtherapeutic (0.17) on 950 units/hr so rate increased to 1150 units/hr and 2000 units IV bolus given.  Subsequent level supratherapeutic (>2.20), repeated to confirm and still 2.10.  Wide variation with small change to infusion rate.  No bleeding noted.       Zosyn added today for aspiration pneumonia coverage.  Hx diarrhea with Cephalexin and nausea/vomiting with Augmentin.  Has had Zosyn in the past.  Goal of Therapy:  Heparin level 0.3-0.7 units/ml Monitor platelets by anticoagulation protocol: Yes  Appropriate Zosyn dose for renal function and indication   Plan:   Hold IV heparin for ~1.5 hours.  Then resume heparin drip at 950 units/hr  Heparin level ~8 hrs after drip resumes.  Daily heparin level and CBC while on heparin.  Zosyn 3.375 gm IV q8hrs (each over 4 hours).  Arty Baumgartner, [place] Pager: [phone] or phone: [phone] 05/03/2018,11:52 AM

## 2018-05-03 NOTE — Progress Notes (Signed)
ANTICOAGULATION CONSULT NOTE - Follow-up Consult  Pharmacy Consult for Heparin Indication: multiple pulmonary emboli  Allergies  Allergen Reactions  . Fluorescein Anaphylaxis, Shortness Of Breath, Itching and Swelling  . Hydralazine Hcl Anaphylaxis, Swelling, Palpitations and Rash  . Sulfa Antibiotics Rash and Other (See Comments)    Other Reaction: red bumps  . Ultram [Tramadol] Hives  . Yellow Dye Anaphylaxis, Itching, Swelling and Other (See Comments)    Fluorescein (in eye drops)  . Buprenex [Buprenorphine Hcl] Palpitations and Other (See Comments)  . Keflex [Cephalexin] Diarrhea  . Lipitor [Atorvastatin] Other (See Comments)    Muscle weakness  . Restasis [Cyclosporine] Swelling    redness of face with slight swelling   . Suprax [Cefixime] Diarrhea  . Augmentin [Amoxicillin-Pot Clavulanate] Nausea And Vomiting  . Levofloxacin Other (See Comments)    Reaction not recalled  . Morphine And Related Other (See Comments)    agitation   . Percocet [Oxycodone-Acetaminophen] Nausea And Vomiting  . Tape Other (See Comments)    Adhesive tape pulls off the skin Able to tolerate paper tape  . Zetia [Ezetimibe] Other (See Comments)    Myalgias    Patient Measurements: Height: 5\' 2"  (157.5 cm) Weight: 172 lb 6.4 oz (78.2 kg) IBW/kg (Calculated) : 50.1 Heparin Dosing Weight: 68 kg  Vital Signs: Temp: 98.1 F (36.7 C) (02/11 1935) Temp Source: Axillary (02/11 1935) BP: 106/67 (02/11 1935) Pulse Rate: 132 (02/11 1935)  Labs: Recent Labs    05/01/18 2353 05/02/18 0323  05/03/18 6144 05/03/18 0941 05/03/18 1601 05/03/18 2102  HGB  --   --   --  14.2  --  14.0  --   HCT  --   --   --  43.2  --  45.2  --   PLT  --   --   --  170  --  151  --   HEPARINUNFRC  --   --    < > >2.20* 2.10*  --  0.42  CREATININE 0.62 0.61  --  0.72  --   --   --    < > = values in this interval not displayed.    Estimated Creatinine Clearance: 44.3 mL/min (by C-G formula based on SCr of 0.72  mg/dL).  Assessment:  [name] [name] with a history of Afib and prior strokes, previously on Eliquis but stopped due to GI bleeding (09/2017) and has been on Plavix.  Plavix now stopped. CTA concerning for multiple PEs ad pharmacy consulted to dose Heparin for anticoagulation.   Heparin level this evening is therapeutic after a rate decrease earlier today (HL 0.42, goal of 0.3-0.7). CBC this evening is stable - no bleeding or issues noted.  Goal of Therapy:  Heparin level 0.3-0.7 units/ml Monitor platelets by anticoagulation protocol: Yes   Plan:  - Continue Heparin at 950 units/hr (9.5 ml/hr) - Will continue to monitor for any signs/symptoms of bleeding and will follow up with heparin level in 8 hours to confirm therapeutic  Thank you for allowing pharmacy to be a part of this patient's care.  Alycia Rossetti, PharmD, BCPS Clinical Pharmacist Clinical phone for 05/03/2018: [phone] 05/03/2018 9:53 PM   **Pharmacist phone directory can now be found on [email or web address] ([email or web address]).  Listed under [place].

## 2018-05-03 NOTE — Care Management Important Message (Signed)
Important Message  Patient Details  Name: [name] [name] MRN: [id number] Date of Birth: 1924/07/31   Medicare Important Message Given:  Yes    Drucella Karbowski Montine Circle 05/03/2018, 3:37 PM

## 2018-05-03 NOTE — Progress Notes (Signed)
Pt remains off floor at this time. RRT unable to obtain ABG at this time.

## 2018-05-03 NOTE — Progress Notes (Signed)
Inpatient Rehabilitation-Admissions Coordinator    Met with patient and her daughter at the bedside to discuss team's recommendation for inpatient rehabilitation. Shared booklets, expectations while in CIR, expected length of stay, and anticipated functional level at DC. Family is willing to provide level of assistance anticipated. Pt very lethargic today and AC discussed how we will need see greater tolerance for CIR. Family understood and appreciative of information. AC will continue to follow for medical readiness and therapy tolerance.   Jhonnie Garner, OTR/L  Rehab Admissions Coordinator  [phone] 05/03/2018 5:50 PM

## 2018-05-03 NOTE — Progress Notes (Addendum)
PT Cancellation Note  Patient Details Name: [name] [name] MRN: [id number] DOB: August 31, 1924   Cancelled Treatment:    Reason Eval/Treat Not Completed: Medical issues which prohibited therapy. Patient unresponsive to noxious stimuli, no response to PRON of Rt LE; grimacing with PROM of Rt UE. Eyes remained closed with head turned toward left. Caregiver in room reported she had been unable to arouse patient this am and that patient running a temperature. PT confirmed this information with nursing. PT will return this afternoon if able. If unable, will attempt tomorrow. PT will continue to follow. OF NOTE: Caregiver stated patient legally blind in both eyes.   [name]. Hartnett-Rands, MS, PT Per [place] [phone] 05/03/2018, 10:30 AM

## 2018-05-03 NOTE — Progress Notes (Signed)
LE venous duplex       has been completed. Preliminary results can be found under CV proc through chart review. June Leap, BS, RDMS, RVT   Called positive results to Ala, RN

## 2018-05-03 NOTE — Progress Notes (Addendum)
PROGRESS NOTE    [name] [name]   UJW:[id number]  DOB: 1924-12-28  DOA: 04/26/2018 PCP: Darcus Austin, MD (Inactive)   Brief Narrative:  [name] [name]  is a 85 y.o. female with medical history significant of PAF, stroke, vascular dementia, blind in L eye, minimal vision in R, HTN who presented from home for a seizure (tonic clonic) noted to have another seizure by EMS and given Versed. She continued to have seizures with eyes being deviated to the left and was given Vimpat. Had another tonic clonic seizure and then given Depakote load after which her seizures resolved. She was noted to be subsequently lethargic and hypoxic. According to her daughter and her aid she had notable aspiration that morning at home when drinking water. She also has dementia and had been awake constantly for about 2 days prior to this seizure.   She was admitted from 04/21/18-04/22/18 for A-fib with RVR and was started on Cardizem.   Subjective: Not fully awake yet today.    Assessment & Plan:   Principal Problem:   Non-convulsive status epilepticus  - CT head > Multiple stable chronic infarctions, microvascular ischemic changes, and volume loss of the brain. - CTA head/neck- no significant new stenosis - resolved after Depakote load- neuro following- on Vimpat and Depakote IV - no prior h/ o seizure but has had a CVA and was not sleeping lately which can precipitate seizures- may have also aspirated at home causing further stress - seizure resolved  Active Problems: Lethargy- deconditioning - likely post ictal- may also be due to sedative effects of AED -  - 2/9> she was beginning to awaken - was sitting up in a chair and beginning to eat and drink again - 2/11> lethargic again today- per daughter this started yesterday afternoon- no sedatives have been given- obtain ABG and MRI brain STAT  Addendum: I have gone back to evaluate the patient and have spoken in detail with her daughter and brother in law, she  remains lethargic, MRI negative ABG unrevealing- check UA (may be negative as I have resumed her Zosyn today)-  - will resume IVF until she awakens  Acute hypoxic respiratory failure, low grade fever 100.4, leukocytosis, hypotension (88/44) > sepsis Pulmonary emboli - noted aspiration at home when she coughed multiple times on water- may have also aspirated during seizures - left perihilar opacity noted on CXR on admission- on repeat CXR, this is noted to have resolved  MRSA PCR neg- d/c'd Vancomycin - blood cultures negative  - she has received 5 day of Zosyn- last day was 2/8 - 2/10>> her hypoxia has not resolved and she is still needed  3 L O2- CXR is unrevealing-  Noted to have sinus tachycardia as well -   obtained CT of the chest and discovered she has b/l PE with moderate clot burden and no cardiac strain. Started Heparin infusion - 2/11 having fevers again- was coughing up yellow sputum- will resume Zosyn- fever may be due to PE as well- follow  Lactic acidosis - due to seizures and hypoxia- improved    History of Rt brain stroke Oct 2015     PAF (paroxysmal atrial fibrillation)   - takes Plavix only for prior CVA and is not on other anticoagulation-  - IV Cardizem infusion switched back to oral Cardizem when she became alert   - will place back on IV Cardizem today- she is in sinus tachycardia with HR in 120-130 for the past 2 days (likely due to  PEs)    Blind left eye- (Fuch's disease)    Vascular dementia   - due to lethargy, have been holding Aricept, Zoloft, Risperdal, Clonopin and Trazodone - Risperdal and Aricept resumed as she was more alert- will hold   Time spent in minutes: 35 min DVT prophylaxis: Lovenox Code Status: DNR Family Communication: daughter, son Disposition Plan:  To be determined- Pt recommended CIR but she is now lethargic again Consultants:   neuro Procedures:   EEG Antimicrobials:  Anti-infectives (From admission, onward)   Start      Dose/Rate Route Frequency Ordered Stop   05/03/18 0900  piperacillin-tazobactam (ZOSYN) IVPB 3.375 g     3.375 g 12.5 mL/hr over 240 Minutes Intravenous Every 8 hours 05/03/18 0822     04/28/18 0600  vancomycin (VANCOCIN) 500 mg in sodium chloride 0.9 % 100 mL IVPB  Status:  Discontinued     500 mg 100 mL/hr over 60 Minutes Intravenous Every 24 hours 04/27/18 0548 04/27/18 1727   04/27/18 0600  vancomycin (VANCOCIN) 1,250 mg in sodium chloride 0.9 % 250 mL IVPB     1,250 mg 166.7 mL/hr over 90 Minutes Intravenous  Once 04/27/18 0548 04/27/18 0859   04/27/18 0600  ceFEPIme (MAXIPIME) 1 g in sodium chloride 0.9 % 100 mL IVPB  Status:  Discontinued     1 g 200 mL/hr over 30 Minutes Intravenous Every 12 hours 04/27/18 0548 04/27/18 0554   04/27/18 0600  piperacillin-tazobactam (ZOSYN) IVPB 3.375 g  Status:  Discontinued     3.375 g 12.5 mL/hr over 240 Minutes Intravenous Every 8 hours 04/27/18 0554 05/01/18 1429       Objective: Vitals:   05/03/18 0700 05/03/18 1129 05/03/18 1223 05/03/18 1300  BP: 128/80  116/67   Pulse:   80   Resp: (!) 22  20   Temp: 99 F (37.2 C) 99.7 F (37.6 C) 98.3 F (36.8 C) 99.4 F (37.4 C)  TempSrc: Axillary Rectal Oral Rectal  SpO2:   96%   Weight:      Height:        Intake/Output Summary (Last 24 hours) at 05/03/2018 1412 Last data filed at 05/03/2018 0500 Gross per 24 hour  Intake 575 ml  Output 600 ml  Net -25 ml   Filed Weights   04/26/18 1700 04/26/18 1835  Weight: 78.2 kg 78.2 kg    Examination: General exam: lethargic HEENT: PERRLA, oral mucosa moist, no sclera icterus or thrush Respiratory system: Clear to auscultation. Respiratory effort normal. Cardiovascular system: S1 & S2 heard,  No murmurs - sinus tachycardia Gastrointestinal system: Abdomen soft, non-tender, nondistended. Normal bowel sound. No organomegaly Central nervous system: lethargic  Extremities: No cyanosis, clubbing or edema Skin: No rashes or  ulcers Psychiatry:  cannot assess   Data Reviewed: I have personally reviewed following labs and imaging studies  CBC: Recent Labs  Lab 04/26/18 1747 04/27/18 0322 04/28/18 0502 04/29/18 0653 05/03/18 0642  WBC 6.3 13.6* 7.8 6.8 10.6*  NEUTROABS 4.1 11.9*  --   --   --   HGB 13.4 13.0 12.9 13.7 14.2  HCT 43.7 41.0 40.6 42.9 43.2  MCV 97.5 93.4 95.5 93.5 91.9  PLT 209 194 163 181 542   Basic Metabolic Panel: Recent Labs  Lab 04/30/18 0849 04/30/18 1633 05/01/18 0632 05/02/18 0323 05/03/18 0642  NA 139 140 139 141 141  K 2.7* 3.7 3.0* 4.2 3.9  CL 99 101 99 104 103  CO2 28 26 27 26  26  GLUCOSE 106* 101* 100* 106* 138*  BUN 9 10 <5* 5* 17  CREATININE 0.67 0.62 0.62 0.61 0.72  CALCIUM 8.5* 8.7* 8.7* 8.9 9.1  MG 2.0  --   --   --   --    GFR: Estimated Creatinine Clearance: 44.3 mL/min (by C-G formula based on SCr of 0.72 mg/dL). Liver Function Tests: Recent Labs  Lab 04/26/18 1747  AST 47*  ALT 18  ALKPHOS 86  BILITOT 1.5*  PROT 6.3*  ALBUMIN 3.5   No results for input(s): LIPASE, AMYLASE in the last 168 hours. No results for input(s): AMMONIA in the last 168 hours. Coagulation Profile: Recent Labs  Lab 04/26/18 1747  INR 1.08   Cardiac Enzymes: No results for input(s): CKTOTAL, CKMB, CKMBINDEX, TROPONINI in the last 168 hours. BNP (last 3 results) No results for input(s): PROBNP in the last 8760 hours. HbA1C: No results for input(s): HGBA1C in the last 72 hours. CBG: Recent Labs  Lab 04/26/18 1737  GLUCAP 137*   Lipid Profile: No results for input(s): CHOL, HDL, LDLCALC, TRIG, CHOLHDL, LDLDIRECT in the last 72 hours. Thyroid Function Tests: No results for input(s): TSH, T4TOTAL, FREET4, T3FREE, THYROIDAB in the last 72 hours. Anemia Panel: No results for input(s): VITAMINB12, FOLATE, FERRITIN, TIBC, IRON, RETICCTPCT in the last 72 hours. Urine analysis:    Component Value Date/Time   COLORURINE YELLOW 04/21/2018 1201   APPEARANCEUR CLEAR  04/21/2018 1201   LABSPEC 1.009 04/21/2018 1201   PHURINE 6.0 04/21/2018 1201   GLUCOSEU NEGATIVE 04/21/2018 1201   HGBUR NEGATIVE 04/21/2018 1201   [place] 04/21/2018 [place] 04/21/2018 1201   PROTEINUR NEGATIVE 04/21/2018 1201   UROBILINOGEN 0.2 12/19/2014 1830   NITRITE NEGATIVE 04/21/2018 1201   LEUKOCYTESUR MODERATE (A) 04/21/2018 1201   Sepsis Labs: @LABRCNTIP (procalcitonin:4,lacticidven:4) ) Recent Results (from the past 240 hour(s))  Culture, blood (routine x 2)     Status: None   Collection Time: 04/27/18  3:29 AM  Result Value Ref Range Status   Specimen Description BLOOD LEFT HAND  Final   Special Requests   Final    BOTTLES DRAWN AEROBIC AND ANAEROBIC Blood Culture adequate volume   Culture   Final    NO GROWTH 5 DAYS Performed at [facility], [place] [address], [place], [place] [address]    Report Status 05/02/2018 FINAL  Final  Culture, blood (routine x 2)     Status: None   Collection Time: 04/27/18  3:36 AM  Result Value Ref Range Status   Specimen Description BLOOD RIGHT ARM  Final   Special Requests   Final    BOTTLES DRAWN AEROBIC AND ANAEROBIC Blood Culture adequate volume   Culture   Final    NO GROWTH 5 DAYS Performed at [facility], [place] [address] [place], [place] [address]    Report Status 05/02/2018 FINAL  Final  MRSA PCR Screening     Status: None   Collection Time: 04/27/18 10:41 AM  Result Value Ref Range Status   MRSA by PCR NEGATIVE NEGATIVE Final    Comment:        The GeneXpert MRSA Assay (FDA approved for NASAL specimens only), is one component of a comprehensive MRSA colonization surveillance program. It is not intended to diagnose MRSA infection nor to guide or monitor treatment for MRSA infections. Performed at [facility], [place] [address] [place], [place] [address]          Radiology  Studies: Ct Angio Chest Pe W Or Wo Contrast  Result Date: 05/02/2018 CLINICAL DATA:   Hypoxemia, noncardiac, etiology unknown EXAM: CT ANGIOGRAPHY CHEST WITH CONTRAST TECHNIQUE: Multidetector CT imaging of the chest was performed using the standard protocol during bolus administration of intravenous contrast. Multiplanar CT image reconstructions and MIPs were obtained to evaluate the vascular anatomy. CONTRAST:  75 cc ISOVUE-370 IOPAMIDOL (ISOVUE-370) INJECTION 76% COMPARISON:  Chest CT angiogram dated 01/30/2011. FINDINGS: Cardiovascular: Pulmonary embolus is seen within the RIGHT interlobar pulmonary artery and within the central and peripheral segmental pulmonary arteries to the RIGHT middle lobe and RIGHT lower lobe. Occlusive thrombus is identified within the RIGHT middle lobe and RIGHT lower lobe. No pulmonary embolism within the main pulmonary arteries bilaterally. Probable small peripheral emboli within the subsegmental pulmonary arteries to the LEFT lower lobe and lingula. Heart size is within normal limits. No evidence of associated RIGHT heart failure. No pericardial effusion. Aortic atherosclerosis. No aortic aneurysm or evidence of aortic dissection seen. Mediastinum/Nodes: No mass or enlarged lymph nodes seen within the mediastinum or perihilar regions. Esophagus is unremarkable. Trachea appears normal. Lungs/Pleura: Bibasilar consolidations, most likely atelectasis. Small bilateral pleural effusions. Evidence of mild air trapping bilaterally, presumably associated to the aforementioned pulmonary emboli. Upper Abdomen: Limited images of the upper abdomen are unremarkable. Musculoskeletal: Degenerative changes throughout the kyphotic and slightly scoliotic thoracic spine, mild to moderate in degree. No acute appearing osseous abnormality. Review of the MIP images confirms the above findings. IMPRESSION: 1. Pulmonary emboli within the RIGHT interlobar pulmonary artery and within the central and peripheral segmental pulmonary artery branches to the RIGHT middle lobe and RIGHT lower lobe,  overall moderate load of pulmonary embolus, including occlusive thrombus within the RIGHT middle lobe and RIGHT lower lobe. No pulmonary embolus within the main pulmonary arteries. No evidence of associated RIGHT heart failure. 2. Probable small additional emboli within the peripheral subsegmental pulmonary arteries to the LEFT lower lobe and lingula. 3. Mild bibasilar consolidations, most likely atelectasis. Small bilateral pleural effusions. Aortic Atherosclerosis (ICD10-I70.0). Critical Value/emergent results were called by telephone at the time of interpretation on 05/02/2018 at 2:00 pm to Dr. Debbe Odea , who verbally acknowledged these results. Electronically Signed   By: Franki Cabot M.D.   On: 05/02/2018 14:06   Dg Chest Port 1 View  Result Date: 05/02/2018 CLINICAL DATA:  New onset fever with tachycardia this morning. EXAM: PORTABLE CHEST 1 VIEW COMPARISON:  04/29/2018 FINDINGS: Patient is moderately rotated to the left with head/chin overlying the upper mediastinum and left apex. Lungs are adequately inflated without focal airspace consolidation or effusion. Cardiomediastinal silhouette and remainder of the exam is unchanged. IMPRESSION: No active disease. Electronically Signed   By: Marin Olp M.D.   On: 05/02/2018 08:58   Vas [place] Lower Extremity Venous (dvt)  Result Date: 05/03/2018  Lower Venous Study Indications: Edema.  Performing Technologist: June Leap RDMS, RVT  Examination Guidelines: A complete evaluation includes B-mode imaging, spectral Doppler, color Doppler, and power Doppler as needed of all accessible portions of each vessel. Bilateral testing is considered an integral part of a complete examination. Limited examinations for reoccurring indications may be performed as noted.  Right Venous Findings: +---------+---------------+---------+-----------+----------+-------+          CompressibilityPhasicitySpontaneityPropertiesSummary  +---------+---------------+---------+-----------+----------+-------+ CFV      Full           Yes      Yes                          +---------+---------------+---------+-----------+----------+-------+  SFJ      Full                                                 +---------+---------------+---------+-----------+----------+-------+ FV Prox  Full                                                 +---------+---------------+---------+-----------+----------+-------+ FV Mid   Full                                                 +---------+---------------+---------+-----------+----------+-------+ FV DistalFull                                                 +---------+---------------+---------+-----------+----------+-------+ PFV      Full                                                 +---------+---------------+---------+-----------+----------+-------+ POP      Full           Yes      Yes                          +---------+---------------+---------+-----------+----------+-------+ PTV      Full                                                 +---------+---------------+---------+-----------+----------+-------+ PERO     Full                                                 +---------+---------------+---------+-----------+----------+-------+  Left Venous Findings: +---------+---------------+---------+-----------+----------+-------+          CompressibilityPhasicitySpontaneityPropertiesSummary +---------+---------------+---------+-----------+----------+-------+ CFV      Full           Yes      Yes                          +---------+---------------+---------+-----------+----------+-------+ SFJ      Full                                                 +---------+---------------+---------+-----------+----------+-------+ FV Prox  Full                                                  +---------+---------------+---------+-----------+----------+-------+  FV Mid   Full                                                 +---------+---------------+---------+-----------+----------+-------+ FV DistalFull                                                 +---------+---------------+---------+-----------+----------+-------+ PFV      Full                                                 +---------+---------------+---------+-----------+----------+-------+ POP      Full           Yes      Yes                          +---------+---------------+---------+-----------+----------+-------+ PTV      Full                                                 +---------+---------------+---------+-----------+----------+-------+ PERO     None                                         Acute   +---------+---------------+---------+-----------+----------+-------+ Gastroc  Partial                                      Acute   +---------+---------------+---------+-----------+----------+-------+    Summary: Right: There is no evidence of deep vein thrombosis in the lower extremity. No cystic structure found in the popliteal fossa. Left: Findings consistent with acute deep vein thrombosis involving the left peroneal vein, and left gastrocnemius vein. No cystic structure found in the popliteal fossa.  *See table(s) above for measurements and observations.    Preliminary       Scheduled Meds: . diltiazem  180 mg Oral Daily  . famotidine  20 mg Oral BID  . feeding supplement (ENSURE ENLIVE)  237 mL Oral BID BM  . prednisoLONE acetate  1 drop Both Eyes QHS  . risperiDONE  1 mg Oral QHS   Continuous Infusions: . heparin    . lacosamide (VIMPAT) IV 100 mg (05/03/18 1017)  . piperacillin-tazobactam (ZOSYN)  IV 3.375 g (05/03/18 1011)  . valproate sodium 500 mg (05/03/18 0534)     LOS: 7 days      Debbe Odea, MD [facility] Pager: [email or web address] Password  [email or web address] 05/03/2018, 2:12 PM

## 2018-05-03 NOTE — Progress Notes (Signed)
RT not able to obtain ABG at this time. Pt not on floor.

## 2018-05-04 ENCOUNTER — Inpatient Hospital Stay (HOSPITAL_COMMUNITY): Payer: PPO

## 2018-05-04 LAB — HEPATIC FUNCTION PANEL
ALT: 25 U/L (ref 0–44)
AST: 32 U/L (ref 15–41)
Albumin: 2.5 g/dL — ABNORMAL LOW (ref 3.5–5.0)
Alkaline Phosphatase: 74 U/L (ref 38–126)
Bilirubin, Direct: 0.2 mg/dL (ref 0.0–0.2)
Indirect Bilirubin: 0.4 mg/dL (ref 0.3–0.9)
Total Bilirubin: 0.6 mg/dL (ref 0.3–1.2)
Total Protein: 5.2 g/dL — ABNORMAL LOW (ref 6.5–8.1)

## 2018-05-04 LAB — CBC
HCT: 42.7 % (ref 36.0–46.0)
Hemoglobin: 13.2 g/dL (ref 12.0–15.0)
MCH: 29.2 pg (ref 26.0–34.0)
MCHC: 30.9 g/dL (ref 30.0–36.0)
MCV: 94.5 fL (ref 80.0–100.0)
PLATELETS: 134 10*3/uL — AB (ref 150–400)
RBC: 4.52 MIL/uL (ref 3.87–5.11)
RDW: 14.1 % (ref 11.5–15.5)
WBC: 10.8 10*3/uL — ABNORMAL HIGH (ref 4.0–10.5)
nRBC: 0 % (ref 0.0–0.2)

## 2018-05-04 LAB — BASIC METABOLIC PANEL
Anion gap: 8 (ref 5–15)
BUN: 18 mg/dL (ref 8–23)
CO2: 29 mmol/L (ref 22–32)
CREATININE: 0.69 mg/dL (ref 0.44–1.00)
Calcium: 8.4 mg/dL — ABNORMAL LOW (ref 8.9–10.3)
Chloride: 107 mmol/L (ref 98–111)
GFR calc Af Amer: >60 mL/min (ref >60)
GFR calc non Af Amer: >60 mL/min (ref >60)
Glucose, Bld: 119 mg/dL — ABNORMAL HIGH (ref 70–99)
Potassium: 4.2 mmol/L (ref 3.5–5.1)
Sodium: 144 mmol/L (ref 135–145)

## 2018-05-04 LAB — GLUCOSE, CAPILLARY: Glucose-Capillary: 138 mg/dL — ABNORMAL HIGH (ref 70–99)

## 2018-05-04 LAB — AMMONIA: Ammonia: 78 umol/L — ABNORMAL HIGH (ref 9–35)

## 2018-05-04 LAB — PROCALCITONIN: Procalcitonin: <0.1 ng/mL

## 2018-05-04 LAB — HEPARIN LEVEL (UNFRACTIONATED): Heparin Unfractionated: 0.37 IU/mL (ref 0.30–0.70)

## 2018-05-04 LAB — VALPROIC ACID LEVEL: Valproic Acid Lvl: 102 ug/mL — ABNORMAL HIGH (ref 50.0–100.0)

## 2018-05-04 MED ORDER — SERTRALINE HCL 50 MG PO TABS
150.0000 mg | ORAL_TABLET | ORAL | Status: DC
  Administered 2018-05-06 – 2018-05-11 (×6): 150 mg via ORAL
  Filled 2018-05-04 (×7): qty 1

## 2018-05-04 MED ORDER — LACTULOSE 10 GM/15ML PO SOLN
30.0000 g | Freq: Two times a day (BID) | ORAL | Status: DC
  Administered 2018-05-05 – 2018-05-06 (×3): 30 g via ORAL
  Filled 2018-05-04 (×4): qty 60

## 2018-05-04 MED ORDER — SODIUM CHLORIDE 0.9 % IV SOLN
INTRAVENOUS | Status: AC
  Administered 2018-05-04 – 2018-05-05 (×2): via INTRAVENOUS

## 2018-05-04 MED ORDER — SODIUM CHLORIDE 0.9 % IV SOLN
200.0000 mg | Freq: Two times a day (BID) | INTRAVENOUS | Status: DC
  Administered 2018-05-04 – 2018-05-07 (×6): 200 mg via INTRAVENOUS
  Filled 2018-05-04 (×6): qty 20

## 2018-05-04 MED ORDER — DONEPEZIL HCL 5 MG PO TABS
7.5000 mg | ORAL_TABLET | ORAL | Status: DC
  Administered 2018-05-06 – 2018-05-11 (×6): 7.5 mg via ORAL
  Filled 2018-05-04 (×7): qty 2

## 2018-05-04 NOTE — Progress Notes (Signed)
ANTICOAGULATION CONSULT NOTE - Follow-up Consult  Pharmacy Consult for Heparin Indication: multiple pulmonary emboli  Allergies  Allergen Reactions  . Fluorescein Anaphylaxis, Shortness Of Breath, Itching and Swelling  . Hydralazine Hcl Anaphylaxis, Swelling, Palpitations and Rash  . Sulfa Antibiotics Rash and Other (See Comments)    Other Reaction: red bumps  . Ultram [Tramadol] Hives  . Yellow Dye Anaphylaxis, Itching, Swelling and Other (See Comments)    Fluorescein (in eye drops)  . Buprenex [Buprenorphine Hcl] Palpitations and Other (See Comments)  . Keflex [Cephalexin] Diarrhea  . Lipitor [Atorvastatin] Other (See Comments)    Muscle weakness  . Restasis [Cyclosporine] Swelling    redness of face with slight swelling   . Suprax [Cefixime] Diarrhea  . Augmentin [Amoxicillin-Pot Clavulanate] Nausea And Vomiting  . Levofloxacin Other (See Comments)    Reaction not recalled  . Morphine And Related Other (See Comments)    agitation   . Percocet [Oxycodone-Acetaminophen] Nausea And Vomiting  . Tape Other (See Comments)    Adhesive tape pulls off the skin Able to tolerate paper tape  . Zetia [Ezetimibe] Other (See Comments)    Myalgias    Patient Measurements: Height: 5\' 2"  (157.5 cm) Weight: 172 lb 6.4 oz (78.2 kg) IBW/kg (Calculated) : 50.1 Heparin Dosing Weight: 68 kg  Vital Signs: Temp: 97.9 F (36.6 C) (02/12 0747) Temp Source: Axillary (02/12 0747) BP: 120/62 (02/12 0747) Pulse Rate: 118 (02/11 2200)  Labs: Recent Labs    05/02/18 0323  05/03/18 1245 05/03/18 0941 05/03/18 1601 05/03/18 2102 05/04/18 0601  HGB  --    < > 14.2  --  14.0  --  13.2  HCT  --   --  43.2  --  45.2  --  42.7  PLT  --   --  170  --  151  --  134*  HEPARINUNFRC  --    < > >2.20* 2.10*  --  0.42 0.37  CREATININE 0.61  --  0.72  --   --   --  0.69   < > = values in this interval not displayed.    Estimated Creatinine Clearance: 44.3 mL/min (by C-G formula based on SCr of 0.69  mg/dL).  Assessment:  76 YOF with a history of Afib and prior strokes, previously on Eliquis but stopped due to GI bleeding (09/2017) and has been on Plavix.  Plavix now stopped. CTA concerning for multiple PEs ad pharmacy consulted to dose Heparin for anticoagulation.   Heparin level remains therapeutic at 0.37, H/H remains wnl, plts slight drop to 134.    Goal of Therapy:  Heparin level 0.3-0.7 units/ml Monitor platelets by anticoagulation protocol: Yes   Plan:  Continue heparin gtt at 950 units/hr Daily heparin level, CBC, s/s bleeding  Bertis Ruddy, PharmD Clinical Pharmacist Please check AMION for all [place] numbers 05/04/2018 8:29 AM

## 2018-05-04 NOTE — Progress Notes (Signed)
EEG Completed; Results Pending  

## 2018-05-04 NOTE — Progress Notes (Signed)
Reason for consult: Encephalopathy  Subjective: Patient is lethargic this morning.  This is been a decline since the last 2 days where she was more awake and was beginning to eat and drink again.  She does have some low-grade fevers.   ROS: Unable to obtain due to poor mental status  Examination  Vital signs in last 24 hours: Temp:  [97.9 F (36.6 C)-99.5 F (37.5 C)] 99.5 F (37.5 C) (02/12 1611) Pulse Rate:  [117-134] 120 (02/12 1611) Resp:  [17-28] 20 (02/12 1201) BP: (102-130)/(61-75) 102/61 (02/12 1611) SpO2:  [94 %-98 %] 96 % (02/12 1611)  General: lying in bed CVS: pulse-normal rate and rhythm RS: breathing comfortably Extremities: normal   Neuro: MS: Lethargic, groans to noxious stimulus, not following any commands nonverbal CN: pupils equal and reactive, no forced gaze deviation or nystagmus, tongue midline, normal sensation over face, Motor: Withdraws in all 4 extremities briskly Reflexes: 2+ bilaterally over patella, biceps, plantars: flexor Coordination: Unable to assess Gait: not tested  Basic Metabolic Panel: Recent Labs  Lab 04/30/18 0849 04/30/18 1633 05/01/18 0632 05/02/18 0323 05/03/18 0642 05/04/18 0601  NA 139 140 139 141 141 144  K 2.7* 3.7 3.0* 4.2 3.9 4.2  CL 99 101 99 104 103 107  CO2 28 26 27 26 26 29   GLUCOSE 106* 101* 100* 106* 138* 119*  BUN 9 10 <5* 5* 17 18  CREATININE 0.67 0.62 0.62 0.61 0.72 0.69  CALCIUM 8.5* 8.7* 8.7* 8.9 9.1 8.4*  MG 2.0  --   --   --   --   --     CBC: Recent Labs  Lab 04/28/18 0502 04/29/18 0653 05/03/18 0642 05/03/18 1601 05/04/18 0601  WBC 7.8 6.8 10.6* 10.2 10.8*  HGB 12.9 13.7 14.2 14.0 13.2  HCT 40.6 42.9 43.2 45.2 42.7  MCV 95.5 93.5 91.9 93.8 94.5  PLT 163 181 170 151 134*     Coagulation Studies: No results for input(s): LABPROT, INR in the last 72 hours.  Imaging Reviewed:   1) generalized periodic discharges (GPD's) with bifrontal shifting predominance, triphasic morphology,  anterior posterior lag 2) generalized irregular slow activity 3) slow PDR  Clinical Interpretation: This EEG is most consistent with a generalized cerebral dysfunction as can be seen in toxic metabolic encephalopathy.  Though GPD's can be on the ictal-interictal continuum, lack of evolution and  characteristic appearance of triphasics would argue for a toxic/metabolic cause in this case.  ASSESSMENT AND PLAN  85 year old female admitted for status epilepticus, resolved with Vimpat and valproic acid.  Also noted to have pulmonary emboli and has been septic likely from aspiration pneumonia. Neurology reconsulted after patient remains lethargic.  Valproic acid level slightly elevated at 102 and ammonia elevated at 78.  Stat EEG was performed: Showed generalized periodic discharges, but no clear epileptiform discharges as previously seen on her EEGs (seizures arising from left occipital region).  EEG pattern favor toxic metabolic encephalopathy likely in the setting of elevated ammonia, antibiotics, sepsis.    Toxic/metabolic encephalopathy Hyperammonemia   Recommendations We will discontinue Depakote Will increase to Vimpat 200 mg twice daily Lactulose to bring ammonia down Continue neurochecks and watch for seizures Seizure precautions Continue to treat for pneumonia/sepsis       Habeeb Puertas [facility] Pager Number [phone] For questions after 7pm please refer to AMION to reach the Neurologist on call

## 2018-05-04 NOTE — Progress Notes (Signed)
Physical Therapy Treatment Patient Details Name: [name] [name] MRN: [id number] DOB: September 13, 1924 Today's Date: 05/04/2018    History of Present Illness Patient is a 85 y/o female who presents with RUE/LE tingling and chest pain. Found to be in [place]. PMH includes dementia, CAd s/p NSTEMI 2015 (medical management), HTN, DVT, CVA with left eye blindness. Chest CT 05/02/18 revealed bilat PEs. Doppler 05/03/18 revealed acute DVT LLE.    PT Comments    Pt lethargic. Unable to arouse despite multiple attempts/methods. PROM performed BUE and RLE. Minimized ROM LLE due to newly diagnosed DVT. Pt started on heparin yesterday afternoon. Eyes closed throughout session. Pt mumbling nonsensical. Not following any commands.  Discharge plan will need to be re-assessed if med status/alertness does not improve.    Follow Up Recommendations  CIR;Supervision/Assistance - 24 hour     Equipment Recommendations  Hospital bed    Recommendations for Other Services       Precautions / Restrictions Precautions Precautions: Fall;Other (comment) Precaution Comments: blind, dependent transfers    Mobility  Bed Mobility                  Transfers                    Ambulation/Gait                 Stairs             Wheelchair Mobility    Modified Rankin (Stroke Patients Only)       Balance                                            Cognition Arousal/Alertness: Lethargic Behavior During Therapy: Flat affect Overall Cognitive Status: Difficult to assess                                 General Comments: unable to arouse      Exercises General Exercises - Upper Extremity Shoulder Flexion: PROM;Right;Left;5 reps Shoulder ABduction: PROM;Right;Left;5 reps Elbow Flexion: PROM;Right;Left;5 reps General Exercises - Lower Extremity Ankle Circles/Pumps: Right;Left;10 reps;PROM Heel Slides: PROM;Right;5 reps Hip ABduction/ADduction:  PROM;5 reps;Right;Left    General Comments General comments (skin integrity, edema, etc.): Daughter present in room. Pt tachycardic at rest with HR ranging 123-137.      Pertinent Vitals/Pain Pain Assessment: Faces Faces Pain Scale: No hurt    Home Living                      Prior Function            PT Goals (current goals can now be found in the care plan section) Acute Rehab PT Goals Patient Stated Goal: not stated PT Goal Formulation: With family Time For Goal Achievement: 05/15/18 Potential to Achieve Goals: Poor Progress towards PT goals: Not progressing toward goals - comment(lethargy)    Frequency    Min 3X/week      PT Plan Current plan remains appropriate    Co-evaluation              AM-PAC PT "6 Clicks" Mobility   Outcome Measure  Help needed turning from your back to your side while in a flat bed without using bedrails?: Total Help needed moving from lying on your back to sitting on  the side of a flat bed without using bedrails?: Total Help needed moving to and from a bed to a chair (including a wheelchair)?: Total Help needed standing up from a chair using your arms (e.g., wheelchair or bedside chair)?: Total Help needed to walk in hospital room?: Total Help needed climbing 3-5 steps with a railing? : Total 6 Click Score: 6    End of Session Equipment Utilized During Treatment: Oxygen Activity Tolerance: Patient limited by lethargy Patient left: in bed;with call bell/phone within reach;with family/visitor present Nurse Communication: Mobility status;Need for lift equipment PT Visit Diagnosis: Muscle weakness (generalized) (M62.81);Difficulty in walking, not elsewhere classified (R26.2)     Time: 0950-1001 PT Time Calculation (min) (ACUTE ONLY): 11 min  Charges:  $Therapeutic Exercise: 8-22 mins                     Lorrin Goodell, PT  Office # [phone] Pager [phone]    Lorriane Shire 05/04/2018, 11:05 AM

## 2018-05-04 NOTE — Progress Notes (Signed)
PROGRESS NOTE    [name] [name]  PIR:[id number] DOB: 06-07-1924 DOA: 04/26/2018 PCP: Darcus Austin, MD (Inactive)   Brief Narrative:  85 year old with past medical history relevant for paroxysmal atrial fibrillation, history of CVA, vascular dementia, hypertension, blindness who was admitted with seizure/status epilepticus.  Her hospital course has been complicated by lethargy and episodes of hypoxia, pulmonary embolism.   Assessment & Plan:   Principal Problem:   Non-convulsive status epilepticus ([place]) Active Problems:   History of Rt brain stroke Oct 2015   Essential hypertension   Blind left eye- (Fuch's disease)   PAF (paroxysmal atrial fibrillation) (HCC)   Vascular dementia with behavior disturbance (HCC)   Dysphagia   History of GI bleed   Tachypnea   #) Seizures/nonconvulsive status/lethargy: Patient was admitted with recurrent seizures and received a load of valproic acid as well as lamotrigine.  Unfortunately now she is having intermittent episodes of lethargy that are preventing discharge. -We will check ammonia, hepatic function panel, valproic acid level -We will discuss with neurology again - Continue IV valproic acid 500 mg every 8 hours -Continue IV lacosamide 100 mg every 12 hours -PRN lorazepam for seizures breakthrou -Repeat EEG -MRI brain on 05/03/2018 shows no acute process  #) Hypoxia/pulmonary embolism: Most likely cause of hypoxia. -Continue heparin drip -We will discuss with family about oral anticoagulation  #) Low-grade fevers: Patient had low-grade fevers.  With hypoxia there was concern for possible pneumonia but low suspicion at this time.  Suspect most likely related to underlying pulmonary embolism. -We will check procalcitonin -Continue IV Zosyn, will discontinue procalcitonin is low -blood cultures on 04/27/2021-  #) Paroxysmal atrial fibrillation/tachycardia: -Continue heparin drip, will transition to novel oral anticoagulant on discharge -  Continue diltiazem 180 mg daily  #) Dementia/pain/psych: - Continue donepezil -Continue sertraline -Hold clonazepam  #) Hypertension: -Continue calcium channel blocker  Fluids: Tolerating p.o. Electrolytes: Monitor and supplement Nutrition: Heart healthy diet  Prophylaxis: Heparin drip  Disposition: Unclear at this time  DO NOT RESUSCITATE    Consultants:   Neurology  PMNR  Procedures:   EEG 04/30/2018:This is a normal recording of the awake and drowsy states.  EEG 05/04/2018: Pending  Antimicrobials:   IV cefepime 04/27/2018  IV Zosyn 04/27/2018 to 05/01/2018, 05/03/2020 ongoing  IV vancomycin 04/27/2018   Subjective: This morning patient reportedly was doing well until she received her a.m. dose of valproic acid per her home aide.  Patient apparently was quite alert and oriented and talking.  She does not have any complaints currently but she is quite  Objective: Vitals:   05/03/18 2000 05/03/18 2100 05/03/18 2200 05/04/18 0747  BP: 102/63 107/66 113/73 120/62  Pulse: (!) 133 (!) 134 (!) 118   Resp: 18 (!) 21 (!) 28 (!) 24  Temp:  98.1 F (36.7 C)  97.9 F (36.6 C)  TempSrc:  Axillary  Axillary  SpO2: 98% 95% 96% 94%  Weight:      Height:        Intake/Output Summary (Last 24 hours) at 05/04/2018 1117 Last data filed at 05/04/2018 0748 Gross per 24 hour  Intake 300 ml  Output 850 ml  Net -550 ml   Filed Weights   04/26/18 1700 04/26/18 1835  Weight: 78.2 kg 78.2 kg    Examination:  General exam: Appears calm and comfortable  Respiratory system: Diminished lung sounds at bases, no wheezes, crackles, rhonchi, no increased work of breathing Cardiovascular system: Tachycardic, regular rhythm, no murmurs Gastrointestinal system: Soft, nondistended, no rebound  or guarding, plus bowel sounds. Central nervous system: Arousable but does not respond to questions, moves all extremities but does not respond to commands Extremities: No lower extremity  edema. Skin: No rashes over visible skin Psychiatry: Unable to assess due to medical condition    Data Reviewed: I have personally reviewed following labs and imaging studies  CBC: Recent Labs  Lab 04/28/18 0502 04/29/18 0653 05/03/18 0642 05/03/18 1601 05/04/18 0601  WBC 7.8 6.8 10.6* 10.2 10.8*  HGB 12.9 13.7 14.2 14.0 13.2  HCT 40.6 42.9 43.2 45.2 42.7  MCV 95.5 93.5 91.9 93.8 94.5  PLT 163 181 170 151 976*   Basic Metabolic Panel: Recent Labs  Lab 04/30/18 0849 04/30/18 1633 05/01/18 0632 05/02/18 0323 05/03/18 0642 05/04/18 0601  NA 139 140 139 141 141 144  K 2.7* 3.7 3.0* 4.2 3.9 4.2  CL 99 101 99 104 103 107  CO2 28 26 27 26 26 29   GLUCOSE 106* 101* 100* 106* 138* 119*  BUN 9 10 <5* 5* 17 18  CREATININE 0.67 0.62 0.62 0.61 0.72 0.69  CALCIUM 8.5* 8.7* 8.7* 8.9 9.1 8.4*  MG 2.0  --   --   --   --   --    GFR: Estimated Creatinine Clearance: 44.3 mL/min (by C-G formula based on SCr of 0.69 mg/dL). Liver Function Tests: No results for input(s): AST, ALT, ALKPHOS, BILITOT, PROT, ALBUMIN in the last 168 hours. No results for input(s): LIPASE, AMYLASE in the last 168 hours. Recent Labs  Lab 05/04/18 1015  AMMONIA 78*   Coagulation Profile: No results for input(s): INR, PROTIME in the last 168 hours. Cardiac Enzymes: No results for input(s): CKTOTAL, CKMB, CKMBINDEX, TROPONINI in the last 168 hours. BNP (last 3 results) No results for input(s): PROBNP in the last 8760 hours. HbA1C: No results for input(s): HGBA1C in the last 72 hours. CBG: No results for input(s): GLUCAP in the last 168 hours. Lipid Profile: No results for input(s): CHOL, HDL, LDLCALC, TRIG, CHOLHDL, LDLDIRECT in the last 72 hours. Thyroid Function Tests: No results for input(s): TSH, T4TOTAL, FREET4, T3FREE, THYROIDAB in the last 72 hours. Anemia Panel: No results for input(s): VITAMINB12, FOLATE, FERRITIN, TIBC, IRON, RETICCTPCT in the last 72 hours. Sepsis Labs: No results for  input(s): PROCALCITON, LATICACIDVEN in the last 168 hours.  Recent Results (from the past 240 hour(s))  Culture, blood (routine x 2)     Status: None   Collection Time: 04/27/18  3:29 AM  Result Value Ref Range Status   Specimen Description BLOOD LEFT HAND  Final   Special Requests   Final    BOTTLES DRAWN AEROBIC AND ANAEROBIC Blood Culture adequate volume   Culture   Final    NO GROWTH 5 DAYS Performed at [facility], [address] [place], [place] [address]    Report Status 05/02/2018 FINAL  Final  Culture, blood (routine x 2)     Status: None   Collection Time: 04/27/18  3:36 AM  Result Value Ref Range Status   Specimen Description BLOOD RIGHT ARM  Final   Special Requests   Final    BOTTLES DRAWN AEROBIC AND ANAEROBIC Blood Culture adequate volume   Culture   Final    NO GROWTH 5 DAYS Performed at [facility], [place] [address], [place], [place] [address]    Report Status 05/02/2018 FINAL  Final  MRSA PCR Screening     Status: None   Collection Time: 04/27/18 10:41  AM  Result Value Ref Range Status   MRSA by PCR NEGATIVE NEGATIVE Final    Comment:        The GeneXpert MRSA Assay (FDA approved for NASAL specimens only), is one component of a comprehensive MRSA colonization surveillance program. It is not intended to diagnose MRSA infection nor to guide or monitor treatment for MRSA infections. Performed at [facility], [place] [address] [place], [place] [address]          Radiology Studies: Ct Angio Chest Pe W Or Wo Contrast  Result Date: 05/02/2018 CLINICAL DATA:  Hypoxemia, noncardiac, etiology unknown EXAM: CT ANGIOGRAPHY CHEST WITH CONTRAST TECHNIQUE: Multidetector CT imaging of the chest was performed using the standard protocol during bolus administration of intravenous contrast. Multiplanar CT image reconstructions and MIPs were obtained to evaluate the vascular anatomy. CONTRAST:  75 cc ISOVUE-370 IOPAMIDOL (ISOVUE-370) INJECTION 76%  COMPARISON:  Chest CT angiogram dated 01/30/2011. FINDINGS: Cardiovascular: Pulmonary embolus is seen within the RIGHT interlobar pulmonary artery and within the central and peripheral segmental pulmonary arteries to the RIGHT middle lobe and RIGHT lower lobe. Occlusive thrombus is identified within the RIGHT middle lobe and RIGHT lower lobe. No pulmonary embolism within the main pulmonary arteries bilaterally. Probable small peripheral emboli within the subsegmental pulmonary arteries to the LEFT lower lobe and lingula. Heart size is within normal limits. No evidence of associated RIGHT heart failure. No pericardial effusion. Aortic atherosclerosis. No aortic aneurysm or evidence of aortic dissection seen. Mediastinum/Nodes: No mass or enlarged lymph nodes seen within the mediastinum or perihilar regions. Esophagus is unremarkable. Trachea appears normal. Lungs/Pleura: Bibasilar consolidations, most likely atelectasis. Small bilateral pleural effusions. Evidence of mild air trapping bilaterally, presumably associated to the aforementioned pulmonary emboli. Upper Abdomen: Limited images of the upper abdomen are unremarkable. Musculoskeletal: Degenerative changes throughout the kyphotic and slightly scoliotic thoracic spine, mild to moderate in degree. No acute appearing osseous abnormality. Review of the MIP images confirms the above findings. IMPRESSION: 1. Pulmonary emboli within the RIGHT interlobar pulmonary artery and within the central and peripheral segmental pulmonary artery branches to the RIGHT middle lobe and RIGHT lower lobe, overall moderate load of pulmonary embolus, including occlusive thrombus within the RIGHT middle lobe and RIGHT lower lobe. No pulmonary embolus within the main pulmonary arteries. No evidence of associated RIGHT heart failure. 2. Probable small additional emboli within the peripheral subsegmental pulmonary arteries to the LEFT lower lobe and lingula. 3. Mild bibasilar  consolidations, most likely atelectasis. Small bilateral pleural effusions. Aortic Atherosclerosis (ICD10-I70.0). Critical Value/emergent results were called by telephone at the time of interpretation on 05/02/2018 at 2:00 pm to Dr. Debbe Odea , who verbally acknowledged these results. Electronically Signed   By: Franki Cabot M.D.   On: 05/02/2018 14:06   Mr Brain Wo Contrast  Result Date: 05/03/2018 CLINICAL DATA:  Acute presentation with seizure. History of strokes and dementia. EXAM: MRI HEAD WITHOUT CONTRAST TECHNIQUE: Multiplanar, multiecho pulse sequences of the brain and surrounding structures were obtained without intravenous contrast. COMPARISON:  Head CT 04/26/2018.  MRI 03/18/2017. FINDINGS: Brain: Diffusion imaging does not show any acute or subacute infarction or other cause of restricted diffusion. There chronic small-vessel ischemic changes of the pons. No focal cerebellar insult. The right hemisphere shows old infarction in the right PCA territory affecting the posteromedial temporal lobe and occipital lobe. Left hemisphere shows old infarction in the inferior MCA territory affecting the lateral temporal lobe and temporoparietal junction region. Extensive chronic small-vessel ischemic changes are  present throughout both hemispheres. Old small vessel infarctions are present affecting the basal ganglia and thalami. There is hemosiderin deposition within the region of the old left MCA infarction. No evidence of recent hemorrhage. No sign of neoplastic mass lesion, hydrocephalus or extra-axial collection. Vascular: Major vessels at the base of the brain show flow. Skull and upper cervical spine: Negative Sinuses/Orbits: Clear/normal Other: None IMPRESSION: No acute finding. Old right PCA territory infarction. Old left MCA territory infarction. Extensive chronic small-vessel ischemic changes elsewhere throughout the brain. Electronically Signed   By: Nelson Chimes M.D.   On: 05/03/2018 15:44   Vas  [place] Lower Extremity Venous (dvt)  Result Date: 05/03/2018  Lower Venous Study Indications: Edema.  Performing Technologist: June Leap RDMS, RVT  Examination Guidelines: A complete evaluation includes B-mode imaging, spectral Doppler, color Doppler, and power Doppler as needed of all accessible portions of each vessel. Bilateral testing is considered an integral part of a complete examination. Limited examinations for reoccurring indications may be performed as noted.  Right Venous Findings: +---------+---------------+---------+-----------+----------+-------+          CompressibilityPhasicitySpontaneityPropertiesSummary +---------+---------------+---------+-----------+----------+-------+ CFV      Full           Yes      Yes                          +---------+---------------+---------+-----------+----------+-------+ SFJ      Full                                                 +---------+---------------+---------+-----------+----------+-------+ FV Prox  Full                                                 +---------+---------------+---------+-----------+----------+-------+ FV Mid   Full                                                 +---------+---------------+---------+-----------+----------+-------+ FV DistalFull                                                 +---------+---------------+---------+-----------+----------+-------+ PFV      Full                                                 +---------+---------------+---------+-----------+----------+-------+ POP      Full           Yes      Yes                          +---------+---------------+---------+-----------+----------+-------+ PTV      Full                                                 +---------+---------------+---------+-----------+----------+-------+  PERO     Full                                                 +---------+---------------+---------+-----------+----------+-------+   Left Venous Findings: +---------+---------------+---------+-----------+----------+-------+          CompressibilityPhasicitySpontaneityPropertiesSummary +---------+---------------+---------+-----------+----------+-------+ CFV      Full           Yes      Yes                          +---------+---------------+---------+-----------+----------+-------+ SFJ      Full                                                 +---------+---------------+---------+-----------+----------+-------+ FV Prox  Full                                                 +---------+---------------+---------+-----------+----------+-------+ FV Mid   Full                                                 +---------+---------------+---------+-----------+----------+-------+ FV DistalFull                                                 +---------+---------------+---------+-----------+----------+-------+ PFV      Full                                                 +---------+---------------+---------+-----------+----------+-------+ POP      Full           Yes      Yes                          +---------+---------------+---------+-----------+----------+-------+ PTV      Full                                                 +---------+---------------+---------+-----------+----------+-------+ PERO     None                                         Acute   +---------+---------------+---------+-----------+----------+-------+ Gastroc  Partial                                      Acute   +---------+---------------+---------+-----------+----------+-------+    Summary: Right: There is no  evidence of deep vein thrombosis in the lower extremity. No cystic structure found in the popliteal fossa. Left: Findings consistent with acute deep vein thrombosis involving the left peroneal vein, and left gastrocnemius vein. No cystic structure found in the popliteal fossa.  *See table(s) above for  measurements and observations. Electronically signed by Deitra Mayo MD on 05/03/2018 at 4:55:09 PM.    Final         Scheduled Meds: . diltiazem  180 mg Oral Daily  . famotidine  20 mg Oral BID  . feeding supplement (ENSURE ENLIVE)  237 mL Oral BID BM  . prednisoLONE acetate  1 drop Both Eyes QHS   Continuous Infusions: . dextrose 5 % and 0.9 % NaCl with KCl 20 mEq/L 125 mL/hr at 05/03/18 2003  . diltiazem (CARDIZEM) infusion 15 mg/hr (05/03/18 1841)  . heparin 950 Units/hr (05/03/18 1421)  . lacosamide (VIMPAT) IV 100 mg (05/04/18 0817)  . piperacillin-tazobactam (ZOSYN)  IV 3.375 g (05/04/18 0957)  . valproate sodium 500 mg (05/04/18 0700)     LOS: 8 days    Time spent: [place], MD [facility]  If 7PM-7AM, please contact night-coverage [email or web address] Password [facility] 05/04/2018, 11:17 AM

## 2018-05-04 NOTE — Procedures (Signed)
History: 85 yo F treated earlier for status epilepticus, now with lethargy.   Sedation: None  Technique: This is a 21 channel routine scalp EEG performed at the bedside with bipolar and monopolar montages arranged in accordance to the international 10/20 system of electrode placement. One channel was dedicated to EKG recording.    Background: The background is markedly disorganized with generalized irregular delta and theta activities.  There is a poorly formed posterior dominant rhythm at times of 7 Hz.  Throughout the study, there are frequent 1.5 to 2 Hz frontally predominant discharges with triphasic morphology.  These are bifrontally predominant, though as well moved more posteriorly, there clearly is a higher amplitude on the right than the left.  I suspect that this is attenuation due to her previous stroke rather than true right-sided predominance.  There is anterior posterior lag associated with these discharges.  There is no ictal evolution to these discharges, though they do wax and wane throughout the study.  Photic stimulation: Physiologic driving is not performed  EEG Abnormalities: 1) generalized periodic discharges (GPD's) with bifrontal shifting predominance, triphasic morphology, anterior posterior lag 2) generalized irregular slow activity 3) slow PDR  Clinical Interpretation: This EEG is most consistent with a generalized cerebral dysfunction as can be seen in toxic metabolic encephalopathy.  Though GPD's can be on the ictal-interictal continuum, lack of evolution and  characteristic appearance of triphasics would argue for a toxic/metabolic cause in this case.  Roland Rack, MD [facility] [phone]  If 7pm- 7am, please page neurology on call as listed in [place].

## 2018-05-05 LAB — BASIC METABOLIC PANEL
Anion gap: 9 (ref 5–15)
BUN: 16 mg/dL (ref 8–23)
CO2: 24 mmol/L (ref 22–32)
Calcium: 8.4 mg/dL — ABNORMAL LOW (ref 8.9–10.3)
Chloride: 107 mmol/L (ref 98–111)
Creatinine, Ser: 0.59 mg/dL (ref 0.44–1.00)
GFR calc Af Amer: >60 mL/min (ref >60)
Glucose, Bld: 101 mg/dL — ABNORMAL HIGH (ref 70–99)
Potassium: 4.1 mmol/L (ref 3.5–5.1)
Sodium: 140 mmol/L (ref 135–145)

## 2018-05-05 LAB — CBC
HCT: 38.4 % (ref 36.0–46.0)
HEMOGLOBIN: 11.7 g/dL — AB (ref 12.0–15.0)
MCH: 28.9 pg (ref 26.0–34.0)
MCHC: 30.5 g/dL (ref 30.0–36.0)
MCV: 94.8 fL (ref 80.0–100.0)
Platelets: 107 10*3/uL — ABNORMAL LOW (ref 150–400)
RBC: 4.05 MIL/uL (ref 3.87–5.11)
RDW: 14.1 % (ref 11.5–15.5)
WBC: 7.5 10*3/uL (ref 4.0–10.5)
nRBC: 0 % (ref 0.0–0.2)

## 2018-05-05 LAB — HEPARIN LEVEL (UNFRACTIONATED): HEPARIN UNFRACTIONATED: 0.38 [IU]/mL (ref 0.30–0.70)

## 2018-05-05 LAB — PROCALCITONIN: Procalcitonin: 0.1 ng/mL

## 2018-05-05 LAB — AMMONIA: Ammonia: 32 umol/L (ref 9–35)

## 2018-05-05 MED ORDER — METOPROLOL TARTRATE 5 MG/5ML IV SOLN
2.5000 mg | Freq: Four times a day (QID) | INTRAVENOUS | Status: DC
  Administered 2018-05-05 – 2018-05-06 (×3): 2.5 mg via INTRAVENOUS
  Filled 2018-05-05 (×2): qty 5

## 2018-05-05 MED ORDER — LACTULOSE ENEMA
300.0000 mL | Freq: Once | ORAL | Status: AC
  Administered 2018-05-05: 300 mL via RECTAL
  Filled 2018-05-05: qty 300

## 2018-05-05 NOTE — Progress Notes (Signed)
ANTICOAGULATION CONSULT NOTE - Follow Up Consult  Pharmacy Consult for Heparin Indication: multiple pulmonary emboli  Allergies  Allergen Reactions  . Fluorescein Anaphylaxis, Shortness Of Breath, Itching and Swelling  . Hydralazine Hcl Anaphylaxis, Swelling, Palpitations and Rash  . Sulfa Antibiotics Rash and Other (See Comments)    Other Reaction: red bumps  . Ultram [Tramadol] Hives  . Yellow Dye Anaphylaxis, Itching, Swelling and Other (See Comments)    Fluorescein (in eye drops)  . Buprenex [Buprenorphine Hcl] Palpitations and Other (See Comments)  . Keflex [Cephalexin] Diarrhea  . Lipitor [Atorvastatin] Other (See Comments)    Muscle weakness  . Restasis [Cyclosporine] Swelling    redness of face with slight swelling   . Suprax [Cefixime] Diarrhea  . Augmentin [Amoxicillin-Pot Clavulanate] Nausea And Vomiting  . Levofloxacin Other (See Comments)    Reaction not recalled  . Morphine And Related Other (See Comments)    agitation   . Percocet [Oxycodone-Acetaminophen] Nausea And Vomiting  . Tape Other (See Comments)    Adhesive tape pulls off the skin Able to tolerate paper tape  . Zetia [Ezetimibe] Other (See Comments)    Myalgias    Patient Measurements: Height: 5\' 2"  (157.5 cm) Weight: 172 lb 6.4 oz (78.2 kg) IBW/kg (Calculated) : 50.1 Heparin Dosing Weight: 68 kg  Vital Signs: Temp: 99.1 F (37.3 C) (02/13 0759) Temp Source: Oral (02/13 0759) BP: 150/78 (02/13 0759) Pulse Rate: 122 (02/13 0759)  Labs: Recent Labs    05/03/18 0642  05/03/18 1601 05/03/18 2102 05/04/18 0601 05/05/18 0519  HGB 14.2  --  14.0  --  13.2 11.7*  HCT 43.2  --  45.2  --  42.7 38.4  PLT 170  --  151  --  134* 107*  HEPARINUNFRC >2.20*   < >  --  0.42 0.37 0.38  CREATININE 0.72  --   --   --  0.69 0.59   < > = values in this interval not displayed.    Estimated Creatinine Clearance: 44.3 mL/min (by C-G formula based on SCr of 0.59 mg/dL).  Assessment:   85 yr old female  started on IV heparin on 2/10 for multiple PEs per CT angiogram.  Hx atrial fibrillation and prior strokes, previously on Eliquis but stopped due to GI bleeding (09/2017) and had been on Plavix, stopped 2/10.  Pepcid added 2/10 but now unable to take PO.       Heparin level remains therapeutic (0.38) on 950 units/hr.   Platelet count dropping, down to 107K  today. ~40% decrease since IV heparin begun.  No bleeding reported. Ammonia up on 2/12, transaminases and bilirubin normal. Albumin low. Lactulose ordered but unable to take PO.    Goal of Therapy:  Heparin level 0.3-0.7 units/ml Monitor platelets by anticoagulation protocol: Yes    Plan:   Continue heparin drip at 950 units/hr  Daily heparin level and CBC while on heparin.  Watch platelet count.  Monitor for any bleeding.  Follow up for transition to oral agent when able.  Change Pepcid to IV while unable to take PO?  Arty Baumgartner, [place] Pager: [phone] or phone: [phone] 05/05/2018,10:23 AM

## 2018-05-05 NOTE — Progress Notes (Signed)
Received verbal order from MD to order lactulose enema. Pt is allergic to Yellow dye (anaphelaxis, swelling and rash). MD notified, consulted with pharmacy. Pharmacy notified RN that Per rectum lactulose do not contain yellow dye only a few oral formulation and it is safe to administer

## 2018-05-05 NOTE — Progress Notes (Signed)
OT Cancellation Note  Patient Details Name: [name] [name] MRN: [id number] DOB: [birth year]   Cancelled Treatment:    Reason Eval/Treat Not Completed: Medical issues which prohibited therapy;Patient at procedure or test/ unavailable.  Pt with decreased arousal, per nsg, and receiving enema.  Will reattempt. Lucille Passy, OTR/L Acute Rehabilitation Services Pager [phone] Office [phone]   Lucille Passy M 05/05/2018, 12:12 PM

## 2018-05-05 NOTE — Progress Notes (Signed)
PROGRESS NOTE    [name] [name]  CNO:[id number] DOB: 05-01-1924 DOA: 04/26/2018 PCP: Darcus Austin, MD (Inactive)   Brief Narrative:  85 year old with past medical history relevant for paroxysmal atrial fibrillation, history of CVA, vascular dementia, hypertension, blindness who was admitted with seizure/status epilepticus.  Her hospital course has been complicated by lethargy and episodes of hypoxia, pulmonary embolism.   Assessment & Plan:   Principal Problem:   Non-convulsive status epilepticus ([place]) Active Problems:   History of Rt brain stroke Oct 2015   Essential hypertension   Blind left eye- (Fuch's disease)   PAF (paroxysmal atrial fibrillation) (HCC)   Vascular dementia with behavior disturbance (HCC)   Dysphagia   History of GI bleed   Tachypnea   #) Seizures/nonconvulsive status/lethargy: Patient was admitted with recurrent seizures and received a load of valproic acid as well as lamotrigine.  Her current episode of lethargy and encephalopathy is likely related to valproic acid and hyperammonemia.  Repeat EEG shows only evidence of metabolic encephalopathy.  Valproic acid level was elevated -Ammonia elevated, will give dose of lactulose  -We will discuss with neurology again - Discontinue IV valproic acid 500 mg every 8 hours -Increase IV lacosamide 200 mg every 12 hours -PRN lorazepam for seizures breakthrou -Repeat EEG on 05/04/2018 shows only encephalopathy that is metabolic -MRI brain on 2/94/7654 shows no acute process  #) Hypoxia/pulmonary embolism: Most likely cause of hypoxia. -Continue heparin drip -We will discuss with family about oral anticoagulation  #) Low-grade fevers: Secondary to pulmonary embolism.  Procalcitonin is undetectable. -Discontinue e IV Zosyn -blood cultures on 04/27/2021-  #) Paroxysmal atrial fibrillation/tachycardia: -Continue heparin drip, will transition to novel oral anticoagulant on discharge - Continue diltiazem 180 mg  daily  #) Dementia/pain/psych: - Continue donepezil -Continue sertraline -Hold clonazepam  #) Hypertension: -Continue calcium channel blocker  Fluids: Tolerating p.o. Electrolytes: Monitor and supplement Nutrition: Heart healthy diet  Prophylaxis: Heparin drip  Disposition: Unclear at this time  DO NOT RESUSCITATE    Consultants:   Neurology  PMNR  Procedures:   EEG 04/30/2018:This is a normal recording of the awake and drowsy states.  EEG 05/04/2018: Pending  Antimicrobials:   IV cefepime 04/27/2018  IV Zosyn 04/27/2018 to 05/01/2018, 05/03/2020 to 05/04/2018  IV vancomycin 04/27/2018   Subjective: This morning continues to be obtunded.  She will only respond to vigorous stimulation.  She does not follow commands.  Objective: Vitals:   05/04/18 1937 05/04/18 2326 05/05/18 0324 05/05/18 0759  BP: (!) 145/78 129/82 128/73 (!) 150/78  Pulse: (!) 131 (!) 134 (!) 113 (!) 122  Resp: (!) 24 18 20 15   Temp: 99 F (37.2 C) 98.1 F (36.7 C) 98.6 F (37 C) 99.1 F (37.3 C)  TempSrc: Axillary Oral Oral Oral  SpO2: 99% 95% 95% 95%  Weight:      Height:        Intake/Output Summary (Last 24 hours) at 05/05/2018 1047 Last data filed at 05/05/2018 6503 Gross per 24 hour  Intake -  Output 900 ml  Net -900 ml   Filed Weights   04/26/18 1700 04/26/18 1835  Weight: 78.2 kg 78.2 kg    Examination:  General exam: Obtunded Respiratory system: Diminished lung sounds at bases, no wheezes, crackles, rhonchi, no increased work of breathing Cardiovascular system: Tachycardic, regular rhythm, no murmurs Gastrointestinal system: Soft, nondistended, no rebound or guarding, plus bowel sounds. Central nervous system: Arousable but does not respond to questions, moves all extremities but does not respond to commands  Extremities: No lower extremity edema. Skin: No rashes over visible skin Psychiatry: Unable to assess due to medical condition    Data Reviewed: I have personally  reviewed following labs and imaging studies  CBC: Recent Labs  Lab 04/29/18 0653 05/03/18 0642 05/03/18 1601 05/04/18 0601 05/05/18 0519  WBC 6.8 10.6* 10.2 10.8* 7.5  HGB 13.7 14.2 14.0 13.2 11.7*  HCT 42.9 43.2 45.2 42.7 38.4  MCV 93.5 91.9 93.8 94.5 94.8  PLT 181 170 151 134* 891*   Basic Metabolic Panel: Recent Labs  Lab 04/30/18 0849  05/01/18 0632 05/02/18 0323 05/03/18 0642 05/04/18 0601 05/05/18 0519  NA 139   < > 139 141 141 144 140  K 2.7*   < > 3.0* 4.2 3.9 4.2 4.1  CL 99   < > 99 104 103 107 107  CO2 28   < > 27 26 26 29 24   GLUCOSE 106*   < > 100* 106* 138* 119* 101*  BUN 9   < > <5* 5* 17 18 16   CREATININE 0.67   < > 0.62 0.61 0.72 0.69 0.59  CALCIUM 8.5*   < > 8.7* 8.9 9.1 8.4* 8.4*  MG 2.0  --   --   --   --   --   --    < > = values in this interval not displayed.   GFR: Estimated Creatinine Clearance: 44.3 mL/min (by C-G formula based on SCr of 0.59 mg/dL). Liver Function Tests: Recent Labs  Lab 05/04/18 1015  AST 32  ALT 25  ALKPHOS 74  BILITOT 0.6  PROT 5.2*  ALBUMIN 2.5*   No results for input(s): LIPASE, AMYLASE in the last 168 hours. Recent Labs  Lab 05/04/18 1015  AMMONIA 78*   Coagulation Profile: No results for input(s): INR, PROTIME in the last 168 hours. Cardiac Enzymes: No results for input(s): CKTOTAL, CKMB, CKMBINDEX, TROPONINI in the last 168 hours. BNP (last 3 results) No results for input(s): PROBNP in the last 8760 hours. HbA1C: No results for input(s): HGBA1C in the last 72 hours. CBG: Recent Labs  Lab 05/04/18 1113  GLUCAP 138*   Lipid Profile: No results for input(s): CHOL, HDL, LDLCALC, TRIG, CHOLHDL, LDLDIRECT in the last 72 hours. Thyroid Function Tests: No results for input(s): TSH, T4TOTAL, FREET4, T3FREE, THYROIDAB in the last 72 hours. Anemia Panel: No results for input(s): VITAMINB12, FOLATE, FERRITIN, TIBC, IRON, RETICCTPCT in the last 72 hours. Sepsis Labs: Recent Labs  Lab 05/04/18 0601  05/05/18 0519  PROCALCITON <0.10 0.10    Recent Results (from the past 240 hour(s))  Culture, blood (routine x 2)     Status: None   Collection Time: 04/27/18  3:29 AM  Result Value Ref Range Status   Specimen Description BLOOD LEFT HAND  Final   Special Requests   Final    BOTTLES DRAWN AEROBIC AND ANAEROBIC Blood Culture adequate volume   Culture   Final    NO GROWTH 5 DAYS Performed at [facility], [address], [place], [place] [address]    Report Status 05/02/2018 FINAL  Final  Culture, blood (routine x 2)     Status: None   Collection Time: 04/27/18  3:36 AM  Result Value Ref Range Status   Specimen Description BLOOD RIGHT ARM  Final   Special Requests   Final    BOTTLES DRAWN AEROBIC AND ANAEROBIC Blood Culture adequate volume   Culture   Final    NO GROWTH 5 DAYS  Performed at [facility], [place] [address] [place], [place] [address]    Report Status 05/02/2018 FINAL  Final  MRSA PCR Screening     Status: None   Collection Time: 04/27/18 10:41 AM  Result Value Ref Range Status   MRSA by PCR NEGATIVE NEGATIVE Final    Comment:        The GeneXpert MRSA Assay (FDA approved for NASAL specimens only), is one component of a comprehensive MRSA colonization surveillance program. It is not intended to diagnose MRSA infection nor to guide or monitor treatment for MRSA infections. Performed at [facility], [place] [address], [place], [place] [address]          Radiology Studies: Mr Brain Wo Contrast  Result Date: 05/03/2018 CLINICAL DATA:  Acute presentation with seizure. History of strokes and dementia. EXAM: MRI HEAD WITHOUT CONTRAST TECHNIQUE: Multiplanar, multiecho pulse sequences of the brain and surrounding structures were obtained without intravenous contrast. COMPARISON:  Head CT 04/26/2018.  MRI 03/18/2017. FINDINGS: Brain: Diffusion imaging does not show any acute or subacute infarction or other cause of restricted diffusion. There chronic  small-vessel ischemic changes of the pons. No focal cerebellar insult. The right hemisphere shows old infarction in the right PCA territory affecting the posteromedial temporal lobe and occipital lobe. Left hemisphere shows old infarction in the inferior MCA territory affecting the lateral temporal lobe and temporoparietal junction region. Extensive chronic small-vessel ischemic changes are present throughout both hemispheres. Old small vessel infarctions are present affecting the basal ganglia and thalami. There is hemosiderin deposition within the region of the old left MCA infarction. No evidence of recent hemorrhage. No sign of neoplastic mass lesion, hydrocephalus or extra-axial collection. Vascular: Major vessels at the base of the brain show flow. Skull and upper cervical spine: Negative Sinuses/Orbits: Clear/normal Other: None IMPRESSION: No acute finding. Old right PCA territory infarction. Old left MCA territory infarction. Extensive chronic small-vessel ischemic changes elsewhere throughout the brain. Electronically Signed   By: Nelson Chimes M.D.   On: 05/03/2018 15:44   Vas [place] Lower Extremity Venous (dvt)  Result Date: 05/03/2018  Lower Venous Study Indications: Edema.  Performing Technologist: June Leap RDMS, RVT  Examination Guidelines: A complete evaluation includes B-mode imaging, spectral Doppler, color Doppler, and power Doppler as needed of all accessible portions of each vessel. Bilateral testing is considered an integral part of a complete examination. Limited examinations for reoccurring indications may be performed as noted.  Right Venous Findings: +---------+---------------+---------+-----------+----------+-------+          CompressibilityPhasicitySpontaneityPropertiesSummary +---------+---------------+---------+-----------+----------+-------+ CFV      Full           Yes      Yes                          +---------+---------------+---------+-----------+----------+-------+  SFJ      Full                                                 +---------+---------------+---------+-----------+----------+-------+ FV Prox  Full                                                 +---------+---------------+---------+-----------+----------+-------+ FV Mid  Full                                                 +---------+---------------+---------+-----------+----------+-------+ FV DistalFull                                                 +---------+---------------+---------+-----------+----------+-------+ PFV      Full                                                 +---------+---------------+---------+-----------+----------+-------+ POP      Full           Yes      Yes                          +---------+---------------+---------+-----------+----------+-------+ PTV      Full                                                 +---------+---------------+---------+-----------+----------+-------+ PERO     Full                                                 +---------+---------------+---------+-----------+----------+-------+  Left Venous Findings: +---------+---------------+---------+-----------+----------+-------+          CompressibilityPhasicitySpontaneityPropertiesSummary +---------+---------------+---------+-----------+----------+-------+ CFV      Full           Yes      Yes                          +---------+---------------+---------+-----------+----------+-------+ SFJ      Full                                                 +---------+---------------+---------+-----------+----------+-------+ FV Prox  Full                                                 +---------+---------------+---------+-----------+----------+-------+ FV Mid   Full                                                 +---------+---------------+---------+-----------+----------+-------+ FV DistalFull                                                  +---------+---------------+---------+-----------+----------+-------+ PFV  Full                                                 +---------+---------------+---------+-----------+----------+-------+ POP      Full           Yes      Yes                          +---------+---------------+---------+-----------+----------+-------+ PTV      Full                                                 +---------+---------------+---------+-----------+----------+-------+ PERO     None                                         Acute   +---------+---------------+---------+-----------+----------+-------+ Gastroc  Partial                                      Acute   +---------+---------------+---------+-----------+----------+-------+    Summary: Right: There is no evidence of deep vein thrombosis in the lower extremity. No cystic structure found in the popliteal fossa. Left: Findings consistent with acute deep vein thrombosis involving the left peroneal vein, and left gastrocnemius vein. No cystic structure found in the popliteal fossa.  *See table(s) above for measurements and observations. Electronically signed by Deitra Mayo MD on 05/03/2018 at 4:55:09 PM.    Final         Scheduled Meds: . diltiazem  180 mg Oral Daily  . donepezil  7.5 mg Oral BH-q7a  . famotidine  20 mg Oral BID  . feeding supplement (ENSURE ENLIVE)  237 mL Oral BID BM  . lactulose  30 g Oral BID  . lactulose  300 mL Rectal Once  . prednisoLONE acetate  1 drop Both Eyes QHS  . sertraline  150 mg Oral BH-q7a   Continuous Infusions: . sodium chloride 75 mL/hr at 05/05/18 0747  . diltiazem (CARDIZEM) infusion 15 mg/hr (05/03/18 1841)  . heparin 950 Units/hr (05/03/18 1421)  . lacosamide (VIMPAT) IV 200 mg (05/05/18 0826)     LOS: 9 days    Time spent: [place], MD [facility]  If 7PM-7AM, please contact night-coverage [email or web address] Password [facility] 05/05/2018, 10:47 AM

## 2018-05-06 LAB — CBC
HCT: 32.3 % — ABNORMAL LOW (ref 36.0–46.0)
Hemoglobin: 10 g/dL — ABNORMAL LOW (ref 12.0–15.0)
MCH: 29.3 pg (ref 26.0–34.0)
MCHC: 31 g/dL (ref 30.0–36.0)
MCV: 94.7 fL (ref 80.0–100.0)
Platelets: 126 10*3/uL — ABNORMAL LOW (ref 150–400)
RBC: 3.41 MIL/uL — ABNORMAL LOW (ref 3.87–5.11)
RDW: 13.9 % (ref 11.5–15.5)
WBC: 8.4 10*3/uL (ref 4.0–10.5)
nRBC: 0 % (ref 0.0–0.2)

## 2018-05-06 LAB — BASIC METABOLIC PANEL
Anion gap: 6 (ref 5–15)
BUN: 24 mg/dL — ABNORMAL HIGH (ref 8–23)
CO2: 27 mmol/L (ref 22–32)
Calcium: 8.9 mg/dL (ref 8.9–10.3)
Chloride: 110 mmol/L (ref 98–111)
Creatinine, Ser: 0.68 mg/dL (ref 0.44–1.00)
GFR calc Af Amer: >60 mL/min (ref >60)
Glucose, Bld: 113 mg/dL — ABNORMAL HIGH (ref 70–99)
POTASSIUM: 4.1 mmol/L (ref 3.5–5.1)
Sodium: 143 mmol/L (ref 135–145)

## 2018-05-06 LAB — PROCALCITONIN: Procalcitonin: 0.11 ng/mL

## 2018-05-06 LAB — HEPARIN LEVEL (UNFRACTIONATED): Heparin Unfractionated: 0.6 IU/mL (ref 0.30–0.70)

## 2018-05-06 MED ORDER — DEXTROSE-NACL 5-0.9 % IV SOLN
INTRAVENOUS | Status: DC
  Administered 2018-05-06: 16:00:00 via INTRAVENOUS
  Administered 2018-05-07: 50 mL/h via INTRAVENOUS
  Administered 2018-05-08 – 2018-05-11 (×4): via INTRAVENOUS

## 2018-05-06 MED ORDER — SODIUM CHLORIDE 0.9 % IV BOLUS
500.0000 mL | Freq: Once | INTRAVENOUS | Status: AC
  Administered 2018-05-06: 500 mL via INTRAVENOUS

## 2018-05-06 MED ORDER — DILTIAZEM HCL-DEXTROSE 100-5 MG/100ML-% IV SOLN (PREMIX)
10.0000 mg/h | INTRAVENOUS | Status: DC
  Filled 2018-05-06: qty 100

## 2018-05-06 NOTE — Plan of Care (Signed)
Pt is not progressing towards desired goals for today

## 2018-05-06 NOTE — Progress Notes (Addendum)
Physical Therapy Treatment Patient Details Name: [name] [name] MRN: [id number] DOB: 02-27-1925 Today's Date: 05/06/2018    History of Present Illness Patient is a 85 y/o female who presents with RUE/LE tingling and chest pain. Found to be in [place]. PMH includes dementia, CAd s/p NSTEMI 2015 (medical management), HTN, DVT, CVA with left eye blindness. Chest CT 05/02/18 revealed bilat PEs. Doppler 05/03/18 revealed acute DVT LLE.    PT Comments    Pt performed bed mobility and functional transfer OOB.  She is unable to stand and required lateral scoot from bed to recliner chair.  Pt is not able to participate in aggressive therapies with her current presentation.  Pt is not able to participate in goal setting or d/c disposition and no family in room this am ( there was a hired Actuary present).  Will have PT to assess next visit to determine continuation of PT services as patient lacks ability to participate during this session.  Will inform supervising PT of need for change in recommendations to SNF at this time.     Follow Up Recommendations  Supervision/Assistance - 24 hour;SNF     Equipment Recommendations  Hospital bed    Recommendations for Other Services       Precautions / Restrictions Precautions Precautions: Fall;Other (comment) Precaution Comments: blind, dependent transfers Restrictions Weight Bearing Restrictions: No    Mobility  Bed Mobility Overal bed mobility: Needs Assistance Bed Mobility: Rolling;Sidelying to Sit Rolling: Total assist;+2 for physical assistance Sidelying to sit: Total assist;+2 for physical assistance       General bed mobility comments: Pt with no active participation during bed mobility, Pt with posterior pelvic tilt and posterior lean, lateral lean also noted.    Transfers Overall transfer level: Needs assistance Equipment used: (bed pad) Transfers: Lateral/Scoot Transfers Sit to Stand: +2 physical assistance;Total assist          General transfer comment: Pt with no assistance to transfers to chair.  She required +2 external assistance and use of bed pad from elevated surface to perform lateral scoot.    Ambulation/Gait Ambulation/Gait assistance: (NT)               Stairs             Wheelchair Mobility    Modified Rankin (Stroke Patients Only)       Balance Overall balance assessment: Needs assistance Sitting-balance support: Feet supported;No upper extremity supported Sitting balance-Leahy Scale: Zero Sitting balance - Comments: no righting response in sitting.         Standing balance comment: unable to assess.                            Cognition Arousal/Alertness: Lethargic Behavior During Therapy: Flat affect Overall Cognitive Status: Difficult to assess                                 General Comments: pt speaking once in sitting briefly but once transferred to chair she returns to sleeping lethargic state.        Exercises      General Comments        Pertinent Vitals/Pain      Home Living                      Prior Function  PT Goals (current goals can now be found in the care plan section) Acute Rehab PT Goals Patient Stated Goal: None stated Potential to Achieve Goals: Poor Progress towards PT goals: Progressing toward goals    Frequency    Min 3X/week      PT Plan Discharge plan needs to be updated    Co-evaluation              AM-PAC PT "6 Clicks" Mobility   Outcome Measure  Help needed turning from your back to your side while in a flat bed without using bedrails?: Total Help needed moving from lying on your back to sitting on the side of a flat bed without using bedrails?: Total Help needed moving to and from a bed to a chair (including a wheelchair)?: Total Help needed standing up from a chair using your arms (e.g., wheelchair or bedside chair)?: Total Help needed to walk in hospital  room?: Total Help needed climbing 3-5 steps with a railing? : Total 6 Click Score: 6    End of Session Equipment Utilized During Treatment: Oxygen(bed pad) Activity Tolerance: Patient limited by lethargy Patient left: in bed;with call bell/phone within reach;with family/visitor present Nurse Communication: Mobility status;Need for lift equipment PT Visit Diagnosis: Muscle weakness (generalized) (M62.81);Difficulty in walking, not elsewhere classified (R26.2)     Time: 8472-0721 PT Time Calculation (min) (ACUTE ONLY): 23 min  Charges:  $Therapeutic Activity: 23-37 mins                     Governor Rooks, PTA Acute Rehabilitation Services Pager [phone] Office [phone]     [name] [name] 05/06/2018, 3:58 PM

## 2018-05-06 NOTE — Progress Notes (Addendum)
[place] TEAM 1 - Stepdown/ICU TEAM  Rodney Wigger  PXT:[id number] DOB: [birth year] DOA: 04/26/2018 PCP: Darcus Austin, MD (Inactive)    Brief Narrative:  [id number] F w/ a history of paroxysmal atrial fibrillation, CVA, vascular dementia, hypertension, and blindness who was admitted with seizure/status epilepticus. Her hospital course has been complicated by lethargy and episodes of hypoxia, as well as pulmonary embolism.  Subjective: This is my first day seeing this patient.  Her grandson is at the bedside and states that she will squeeze his hand and open her eyes and look at him but that otherwise she simply mumbles.  She is not yet back to her baseline, but is perhaps somewhat more alert than suggested by notes yesterday.  There is no evidence of respiratory distress or uncontrolled pain.  Assessment & Plan:  Seizures / Status epilepticus  admitted with recurrent seizures - valproic acid has also lead to hyperammonemia compounding her lethargy - valproic acid stopped - repeat EEG 05/04/2018 showed only encephalopathy - MRI brain 05/03/2018 showed no acute process -continue supportive care  Acute hypoxic resp failure / pulmonary embolism continue heparin drip -transition to oral agent when more alert and oral intake more consistent -follow-up chest x-ray in a.m. - wean to room air as able  Low-grade fevers Procalcitonin undetectable - abx stopped w/ no clear infectious source -currently afebrile with normal WBC  Paroxysmal atrial fibrillation w/ RVR  continue diltiazem - NSR at time of exam today -continue IV heparin until able to tolerate oral anticoagulant  Dementia Continue donepezil  Hypertension Blood pressure well controlled at this time  DVT prophylaxis: IV heparin Code Status: DNR - NO CODE Family Communication: Spoke with grandson at bedside Disposition Plan: Stable for transfer to telemetry bed  Consultants:  Neurology   Antimicrobials:  Zosyn 2/05 > 2/8 + 2/11 > 2/12    Objective: Blood pressure (!) 107/56, pulse 83, temperature 99 F (37.2 C), temperature source Axillary, resp. rate (!) 23, height 5\' 2"  (1.575 m), weight 78.2 kg, SpO2 91 %.  Intake/Output Summary (Last 24 hours) at 05/06/2018 1441 Last data filed at 05/05/2018 1500 Gross per 24 hour  Intake 1082.86 ml  Output -  Net 1082.86 ml   Filed Weights   04/26/18 1700 04/26/18 1835  Weight: 78.2 kg 78.2 kg    Examination: General: No acute respiratory distress Lungs: Clear to auscultation bilaterally without wheezes or crackles Cardiovascular: Regular rate and rhythm without murmur gallop or rub normal S1 and S2 Abdomen: Nondistended, soft, bowel sounds positive, no rebound, no ascites, no appreciable mass Extremities: No significant cyanosis, clubbing, or edema bilateral lower extremities  CBC: Recent Labs  Lab 05/04/18 0601 05/05/18 0519 05/06/18 0613  WBC 10.8* 7.5 8.4  HGB 13.2 11.7* 10.0*  HCT 42.7 38.4 32.3*  MCV 94.5 94.8 94.7  PLT 134* 107* 350*   Basic Metabolic Panel: Recent Labs  Lab 04/30/18 0849  05/04/18 0601 05/05/18 0519 05/06/18 0613  NA 139   < > 144 140 143  K 2.7*   < > 4.2 4.1 4.1  CL 99   < > 107 107 110  CO2 28   < > 29 24 27   GLUCOSE 106*   < > 119* 101* 113*  BUN 9   < > 18 16 24*  CREATININE 0.67   < > 0.69 0.59 0.68  CALCIUM 8.5*   < > 8.4* 8.4* 8.9  MG 2.0  --   --   --   --    < > =  values in this interval not displayed.   GFR: Estimated Creatinine Clearance: 44.3 mL/min (by C-G formula based on SCr of 0.68 mg/dL).  Liver Function Tests: Recent Labs  Lab 05/04/18 1015  AST 32  ALT 25  ALKPHOS 74  BILITOT 0.6  PROT 5.2*  ALBUMIN 2.5*    Recent Labs  Lab 05/04/18 1015 05/05/18 1952  AMMONIA 78* 32    HbA1C: Hgb A1c MFr Bld  Date/Time Value Ref Range Status  09/22/2017 04:41 AM 5.6 4.8 - 5.6 % Final    Comment:    (NOTE) Pre diabetes:          5.7%-6.4% Diabetes:              >6.4% Glycemic control for    <7.0% adults with diabetes   03/19/2017 04:56 AM 5.3 4.8 - 5.6 % Final    Comment:    (NOTE) Pre diabetes:          5.7%-6.4% Diabetes:              >6.4% Glycemic control for   <7.0% adults with diabetes     CBG: Recent Labs  Lab 05/04/18 1113  GLUCAP 138*    Recent Results (from the past 240 hour(s))  Culture, blood (routine x 2)     Status: None   Collection Time: 04/27/18  3:29 AM  Result Value Ref Range Status   Specimen Description BLOOD LEFT HAND  Final   Special Requests   Final    BOTTLES DRAWN AEROBIC AND ANAEROBIC Blood Culture adequate volume   Culture   Final    NO GROWTH 5 DAYS Performed at [facility], [address], [place], [place] [address]    Report Status 05/02/2018 FINAL  Final  Culture, blood (routine x 2)     Status: None   Collection Time: 04/27/18  3:36 AM  Result Value Ref Range Status   Specimen Description BLOOD RIGHT ARM  Final   Special Requests   Final    BOTTLES DRAWN AEROBIC AND ANAEROBIC Blood Culture adequate volume   Culture   Final    NO GROWTH 5 DAYS Performed at [facility], [place] [address], [place], [place] [address]    Report Status 05/02/2018 FINAL  Final  MRSA PCR Screening     Status: None   Collection Time: 04/27/18 10:41 AM  Result Value Ref Range Status   MRSA by PCR NEGATIVE NEGATIVE Final    Comment:        The GeneXpert MRSA Assay (FDA approved for NASAL specimens only), is one component of a comprehensive MRSA colonization surveillance program. It is not intended to diagnose MRSA infection nor to guide or monitor treatment for MRSA infections. Performed at [facility], [place] [address] [place], [place] [address]      Scheduled Meds: . diltiazem  180 mg Oral Daily  . donepezil  7.5 mg Oral BH-q7a  . famotidine  20 mg Oral BID  . feeding supplement (ENSURE ENLIVE)  237 mL Oral BID BM  . lactulose  30 g Oral BID  . metoprolol tartrate  2.5 mg Intravenous Q6H  . prednisoLONE acetate   1 drop Both Eyes QHS  . sertraline  150 mg Oral BH-q7a     LOS: 10 days   Cherene Altes, MD [facility] Office  [phone] Pager - Text Page per Amion  If 7PM-7AM, please contact night-coverage per Amion 05/06/2018, 2:41 PM

## 2018-05-06 NOTE — Progress Notes (Signed)
ANTICOAGULATION CONSULT NOTE - Follow Up Consult  Pharmacy Consult for Heparin Indication: multiple pulmonary emboli  Allergies  Allergen Reactions  . Fluorescein Anaphylaxis, Shortness Of Breath, Itching and Swelling  . Hydralazine Hcl Anaphylaxis, Swelling, Palpitations and Rash  . Sulfa Antibiotics Rash and Other (See Comments)    Other Reaction: red bumps  . Ultram [Tramadol] Hives  . Yellow Dye Anaphylaxis, Itching, Swelling and Other (See Comments)    Fluorescein (in eye drops)  . Buprenex [Buprenorphine Hcl] Palpitations and Other (See Comments)  . Keflex [Cephalexin] Diarrhea  . Lipitor [Atorvastatin] Other (See Comments)    Muscle weakness  . Restasis [Cyclosporine] Swelling    redness of face with slight swelling   . Suprax [Cefixime] Diarrhea  . Augmentin [Amoxicillin-Pot Clavulanate] Nausea And Vomiting  . Levofloxacin Other (See Comments)    Reaction not recalled  . Morphine And Related Other (See Comments)    agitation   . Percocet [Oxycodone-Acetaminophen] Nausea And Vomiting  . Tape Other (See Comments)    Adhesive tape pulls off the skin Able to tolerate paper tape  . Zetia [Ezetimibe] Other (See Comments)    Myalgias    Patient Measurements: Height: 5\' 2"  (157.5 cm) Weight: 172 lb 6.4 oz (78.2 kg) IBW/kg (Calculated) : 50.1 Heparin Dosing Weight: 68 kg  Vital Signs: Temp: 99 F (37.2 C) (02/14 0342) Temp Source: Axillary (02/14 0342) BP: 160/0 (02/14 0844) Pulse Rate: 151 (02/14 0844)  Labs: Recent Labs    05/04/18 0601 05/05/18 0519 05/06/18 0613  HGB 13.2 11.7* 10.0*  HCT 42.7 38.4 32.3*  PLT 134* 107* 126*  HEPARINUNFRC 0.37 0.38 0.60  CREATININE 0.69 0.59 0.68    Estimated Creatinine Clearance: 44.3 mL/min (by C-G formula based on SCr of 0.68 mg/dL).  Assessment:   85 yr old female started on IV heparin on 2/10 for multiple PEs per CT angiogram.  Hx atrial fibrillation and prior strokes, previously on Eliquis but stopped due to GI  bleeding (09/2017) and had been on Plavix, stopped 2/10.  Pepcid added 2/10 but now unable to take PO.    Heparin level remains therapeutic at 0.6 on 950 units/hr, Plts trending back up, no bleeding reported.    Goal of Therapy:  Heparin level 0.3-0.7 units/ml Monitor platelets by anticoagulation protocol: Yes    Plan:  Continue heparin gtt at 950 units/hr Daily heparin level, CBC, s/s bleeding  Bertis Ruddy, PharmD Clinical Pharmacist Please check AMION for all [place] numbers 05/06/2018 8:48 AM

## 2018-05-06 NOTE — Progress Notes (Signed)
Inpatient Rehabilitation-Admissions Coordinator   Noted recommendations changed to SNF today. At this time, pt is unable to tolerate CIR level therapy. AC will sign off as pt no longer appropriate for CIR.   Please call if questions.   Jhonnie Garner, OTR/L  Rehab Admissions Coordinator  [phone] 05/06/2018 5:30 PM

## 2018-05-06 NOTE — Evaluation (Signed)
Occupational Therapy Evaluation Patient Details Name: [name] [name] MRN: [id number] DOB: 06/16/1924 Today's Date: 05/06/2018    History of Present Illness Patient is a 85 y/o female who presents with RUE/LE tingling and chest pain. Found to be in [place]. PMH includes dementia, CAd s/p NSTEMI 2015 (medical management), HTN, DVT, CVA with left eye blindness. Chest CT 05/02/18 revealed bilat PEs. Doppler 05/03/18 revealed acute DVT LLE.   Clinical Impression   Limited OT eval this session secondary to lethargy and tachycardia with HR ranging from 115-180.  Total assist for all self feeding with total +2 pt 0% for bed mobility, rolling in bed, and sitting balance EOB.  Pt did not open eyes and only responded X 1 verbally with dysarthric speech when asked to state her first name.  Per caregiver in the room Martesha, pt was able to transfer with min assist and could complete most selfcare tasks with min assist and self feed with setup.  Feel at this time she will benefit from trial OT to help increase independence with basic selfcare tasks.  Would recommend SNF at this time based on active participation.  Will continue to update as we follow in acute care.      Follow Up Recommendations  SNF    Equipment Recommendations  None recommended by OT       Precautions / Restrictions Precautions Precautions: Fall;Other (comment) Precaution Comments: blind, dependent transfers Restrictions Weight Bearing Restrictions: No      Mobility Bed Mobility Overal bed mobility: Needs Assistance Bed Mobility: Supine to Sit;Sit to Supine     Supine to sit: Total assist;HOB elevated;+2 for physical assistance Sit to supine: +2 for physical assistance;Total assist   General bed mobility comments: Pt with no active participation during bed mobility.  Transfers                      Balance Overall balance assessment: Needs assistance Sitting-balance support: Feet supported;No upper extremity  supported Sitting balance-Leahy Scale: Zero                                     ADL either performed or assessed with clinical judgement   ADL Overall ADL's : Needs assistance/impaired Eating/Feeding: Bed level;Total assistance   Grooming: Wash/dry face;Bed level;Total assistance                                 General ADL Comments: Pt still with decreased arousal and could not actively participate with any instructional commands for selfcare tasks.  One of pt's caregivers present Marieelena and was assistiing with feeding as pt would not initiate use of utensil.  She would only open mouth to take food and then swallow.  One episode of coughing noted as well.  Transferred pt to the EOB with total assist +2 (pt 0%).  Total assist for sitting balance for 10 mins while nursing gave medications by mouth.  Noted pt's HR Tach from 115-180 with sitting.  Returned to supine at completion of session.  Will benefit from trial OT at this time to work increasing ADL independence.       Vision Baseline Vision/History: (Blind in the left eye and can only see slightly in the right per caregiver.) Vision Assessment?: (Unable to secondary to arousal but has blindness at baseline)  Pertinent Vitals/Pain Pain Assessment: Faces Pain Score: 0-No pain Faces Pain Scale: No hurt     Hand Dominance Right   Extremity/Trunk Assessment Upper Extremity Assessment Upper Extremity Assessment: (Pt with PROM WFLS bilaterally and noted occasional spontaneously movement but unable to follow instructions or participate in MMT)       Cervical / Trunk Assessment Cervical / Trunk Assessment: Normal   Communication     Cognition Arousal/Alertness: Lethargic Behavior During Therapy: Flat affect                                   General Comments: unable to arouse              Home Living Family/patient expects to be discharged to:: Private residence Living  Arrangements: Children;Alone(but pt has 24 hour care with sitters and with family) Available Help at Discharge: Family;Available 24 hours/day;Personal care attendant Type of Home: House Home Access: Stairs to enter [employer] of Steps: 3 in front, ramp in back   Home Layout: Multi-level Alternate Level Stairs-Number of Steps: 1 flight Alternate Level Stairs-Rails: Right Bathroom Shower/Tub: Tub/shower unit;Walk-in shower         Home Equipment: Tub bench   Additional Comments: split level, 7 up/6 down to get to bathroom, could set her up in downstairs, recommend hospital bed      Prior Functioning/Environment    Gait / Transfers Assistance Needed: needs to be led due to vision deficits, but otherwise walks independently ADL's / Homemaking Assistance Needed: was toileting independently and stepping into shower 1 week ago   Comments: has 24/7 caregivers        OT Problem List: Decreased strength;Impaired balance (sitting and/or standing);Decreased cognition;Impaired vision/perception;Decreased activity tolerance;Decreased safety awareness;Decreased knowledge of use of DME or AE      OT Treatment/Interventions: Self-care/ADL training;Therapeutic activities;Balance training;Therapeutic exercise;Cognitive remediation/compensation;Manual therapy;Neuromuscular education;Visual/perceptual remediation/compensation;Patient/family education    OT Goals(Current goals can be found in the care plan section) Acute Rehab OT Goals Patient Stated Goal: None stated OT Goal Formulation: (with caregiver present) Time For Goal Achievement: 05/20/18 Potential to Achieve Goals: Fair  OT Frequency: Min 2X/week              AM-PAC OT "6 Clicks" Daily Activity     Outcome Measure Help from another person eating meals?: Total Help from another person taking care of personal grooming?: Total Help from another person toileting, which includes using toliet, bedpan, or urinal?:  Total Help from another person bathing (including washing, rinsing, drying)?: Total Help from another person to put on and taking off regular upper body clothing?: Total Help from another person to put on and taking off regular lower body clothing?: Total 6 Click Score: 6   End of Session Nurse Communication: Need for lift equipment  Activity Tolerance: Patient limited by lethargy Patient left: in bed;with call bell/phone within reach;with family/visitor present  OT Visit Diagnosis: Muscle weakness (generalized) (M62.81);Feeding difficulties (R63.3)                Time: 6144-3154 OT Time Calculation (min): 31 min Charges:  OT General Charges $OT Visit: 1 Visit OT Evaluation $OT Eval Moderate Complexity: 1 Mod OT Treatments $Self Care/Home Management : 8-22 mins   Carlyn Mullenbach OTR/L  05/06/2018, 9:00 AM

## 2018-05-07 ENCOUNTER — Inpatient Hospital Stay (HOSPITAL_COMMUNITY): Payer: PPO

## 2018-05-07 LAB — HEPARIN LEVEL (UNFRACTIONATED): Heparin Unfractionated: 0.42 IU/mL (ref 0.30–0.70)

## 2018-05-07 LAB — CBC
HCT: 25.4 % — ABNORMAL LOW (ref 36.0–46.0)
Hemoglobin: 8 g/dL — ABNORMAL LOW (ref 12.0–15.0)
MCH: 30.2 pg (ref 26.0–34.0)
MCHC: 31.5 g/dL (ref 30.0–36.0)
MCV: 95.8 fL (ref 80.0–100.0)
Platelets: 165 10*3/uL (ref 150–400)
RBC: 2.65 MIL/uL — AB (ref 3.87–5.11)
RDW: 14.5 % (ref 11.5–15.5)
WBC: 10.4 10*3/uL (ref 4.0–10.5)
nRBC: 0.2 % (ref 0.0–0.2)

## 2018-05-07 LAB — COMPREHENSIVE METABOLIC PANEL
ALT: 24 U/L (ref 0–44)
AST: 33 U/L (ref 15–41)
Albumin: 2.2 g/dL — ABNORMAL LOW (ref 3.5–5.0)
Alkaline Phosphatase: 80 U/L (ref 38–126)
Anion gap: 3 — ABNORMAL LOW (ref 5–15)
BUN: 24 mg/dL — ABNORMAL HIGH (ref 8–23)
CO2: 26 mmol/L (ref 22–32)
Calcium: 8.7 mg/dL — ABNORMAL LOW (ref 8.9–10.3)
Chloride: 114 mmol/L — ABNORMAL HIGH (ref 98–111)
Creatinine, Ser: 0.57 mg/dL (ref 0.44–1.00)
GFR calc Af Amer: >60 mL/min (ref >60)
Glucose, Bld: 152 mg/dL — ABNORMAL HIGH (ref 70–99)
Potassium: 3.4 mmol/L — ABNORMAL LOW (ref 3.5–5.1)
Sodium: 143 mmol/L (ref 135–145)
TOTAL PROTEIN: 4.6 g/dL — AB (ref 6.5–8.1)
Total Bilirubin: 0.5 mg/dL (ref 0.3–1.2)

## 2018-05-07 LAB — AMMONIA: Ammonia: 36 umol/L — ABNORMAL HIGH (ref 9–35)

## 2018-05-07 LAB — FOLATE: Folate: 9.1 ng/mL (ref >5.9)

## 2018-05-07 LAB — VITAMIN B12: Vitamin B-12: 1295 pg/mL — ABNORMAL HIGH (ref 180–914)

## 2018-05-07 MED ORDER — RISPERIDONE 0.5 MG PO TABS
2.0000 mg | ORAL_TABLET | Freq: Every day | ORAL | Status: DC
  Administered 2018-05-07 – 2018-05-10 (×4): 2 mg via ORAL
  Filled 2018-05-07 (×4): qty 4

## 2018-05-07 MED ORDER — LACOSAMIDE 200 MG PO TABS
200.0000 mg | ORAL_TABLET | Freq: Two times a day (BID) | ORAL | Status: DC
  Administered 2018-05-07 – 2018-05-11 (×8): 200 mg via ORAL
  Filled 2018-05-07 (×8): qty 1

## 2018-05-07 MED ORDER — RIVAROXABAN 15 MG PO TABS
15.0000 mg | ORAL_TABLET | Freq: Two times a day (BID) | ORAL | Status: DC
  Administered 2018-05-07 – 2018-05-08 (×2): 15 mg via ORAL
  Filled 2018-05-07 (×2): qty 1

## 2018-05-07 MED ORDER — DILTIAZEM HCL ER COATED BEADS 180 MG PO CP24
180.0000 mg | ORAL_CAPSULE | Freq: Every day | ORAL | Status: DC
  Administered 2018-05-07 – 2018-05-08 (×2): 180 mg via ORAL
  Filled 2018-05-07 (×2): qty 1

## 2018-05-07 MED ORDER — RIVAROXABAN 20 MG PO TABS: 20.0000 mg | ORAL_TABLET | Freq: Every day | ORAL | Status: DC

## 2018-05-07 NOTE — Progress Notes (Signed)
ANTICOAGULATION CONSULT NOTE - Follow Up Consult  Pharmacy Consult for Heparin>>xarelto Indication: multiple pulmonary emboli  Allergies  Allergen Reactions  . Fluorescein Anaphylaxis, Shortness Of Breath, Itching and Swelling  . Hydralazine Hcl Anaphylaxis, Swelling, Palpitations and Rash  . Sulfa Antibiotics Rash and Other (See Comments)    Other Reaction: red bumps  . Ultram [Tramadol] Hives  . Yellow Dye Anaphylaxis, Itching, Swelling and Other (See Comments)    Fluorescein (in eye drops)  . Buprenex [Buprenorphine Hcl] Palpitations and Other (See Comments)  . Keflex [Cephalexin] Diarrhea  . Lipitor [Atorvastatin] Other (See Comments)    Muscle weakness  . Restasis [Cyclosporine] Swelling    redness of face with slight swelling   . Suprax [Cefixime] Diarrhea  . Augmentin [Amoxicillin-Pot Clavulanate] Nausea And Vomiting  . Levofloxacin Other (See Comments)    Reaction not recalled  . Morphine And Related Other (See Comments)    agitation   . Percocet [Oxycodone-Acetaminophen] Nausea And Vomiting  . Tape Other (See Comments)    Adhesive tape pulls off the skin Able to tolerate paper tape  . Zetia [Ezetimibe] Other (See Comments)    Myalgias    Patient Measurements: Height: 5\' 2"  (157.5 cm) Weight: 172 lb 6.4 oz (78.2 kg) IBW/kg (Calculated) : 50.1 Heparin Dosing Weight: 68 kg  Vital Signs: Temp: 98.6 F (37 C) (02/15 0811) Temp Source: Axillary (02/15 0811) BP: 162/67 (02/15 0900) Pulse Rate: 103 (02/15 0900)  Labs: Recent Labs    05/05/18 0519 05/06/18 0613 05/07/18 0552  HGB 11.7* 10.0* 8.0*  HCT 38.4 32.3* 25.4*  PLT 107* 126* 165  HEPARINUNFRC 0.38 0.60 0.42  CREATININE 0.59 0.68 0.57    Estimated Creatinine Clearance: 44.3 mL/min (by C-G formula based on SCr of 0.57 mg/dL).  Assessment:   85 yr old female started on IV heparin on 2/10 for multiple PEs per CT angiogram.  Hx atrial fibrillation and prior strokes, previously on Eliquis but stopped  due to GI bleeding (09/2017) and had been on Plavix, stopped 2/10.  Pepcid added 2/10 but now unable to take PO.    Plan is to transition to PO Xarelto today. Scr <1, CrCl~44. Does have low baseline hgb. Pt did have a hx of GIB on Eliquis.   Goal of Therapy:  Monitor platelets by anticoagulation protocol: Yes    Plan:   Dc heparin  Xarelto 15mg  BID x21d then 20mg  PO qday with supper F/u with CBC  Onnie Boer, PharmD, BCIDP, AAHIVP, CPP Infectious Disease Pharmacist 05/07/2018 11:19 AM

## 2018-05-07 NOTE — NC FL2 (Signed)
[place] MEDICAID FL2 LEVEL OF CARE SCREENING TOOL     IDENTIFICATION  Patient Name: [name] [name] Birthdate: [birth year] Sex: female Admission Date (Current Location): 04/26/2018  [facility] and [place] Number:  Herbalist and Address:  [facility] [place], [place] [address] [place], [place] [address]      Provider Number: [id number]  Attending Physician Name and Address:  Tawni Millers,*  Relative Name and Phone Number:  [name], Daughter, [phone] & Jazzlene Huot, Son, [phone]    Current Level of Care: SNF Recommended Level of Care: [place] Prior Approval Number: [id number]  Date Approved/Denied: 06/28/12 PASRR Number:    Discharge Plan: SNF    Current Diagnoses: Patient Active Problem List   Diagnosis Date Noted  . Dysphagia   . History of GI bleed   . Tachypnea   . Non-convulsive status epilepticus ([place]) 04/26/2018  . Atrial fibrillation with RVR ([place]) 04/21/2018  . Vascular dementia with behavior disturbance ([place])   . Epigastric pain 11/18/2017  . Belching 11/18/2017  . Diastolic dysfunction   . Hypokalemia   . Seizures ([place])   . PAF (paroxysmal atrial fibrillation) ([place])   . Cerebral embolism with cerebral infarction 09/21/2017  . Acute CVA (cerebrovascular accident) ([place]) 09/21/2017  . Blind 04/02/2017  . Visual field cut- right 04/02/2017  . Gastroesophageal reflux disease   . Pre-diabetes   . Blind left eye- (Fuch's disease) 03/05/2015  . Essential hypertension 12/19/2014  . CAD (coronary artery disease) 12/19/2014  . History of Rt brain stroke Oct 2015 06/14/2014  . Headache 06/14/2014  . Malignant hypertension 01/20/2014  . Aphasia 01/04/2014  . Paresthesia 01/04/2014  . Generalized abdominal pain 01/01/2014  . History of diverticulitis 01/01/2014  . Diaphoresis 12/29/2012  . Sleep disturbance 10/03/2012  . Depression 07/12/2012  . OA (osteoarthritis) of knee 06/27/2012  .  History of PSVT 12/15/2011    Orientation RESPIRATION BLADDER Height & Weight     (Disorientated x4)  Normal Incontinent, External catheter Weight: 172 lb 6.4 oz (78.2 kg) Height:  5\' 2"  (157.5 cm)  BEHAVIORAL SYMPTOMS/MOOD NEUROLOGICAL BOWEL NUTRITION STATUS      Incontinent Diet(Dys 1, thin liquids)  AMBULATORY STATUS COMMUNICATION OF NEEDS Skin   Limited Assist   Bruising, Normal(On left and right arms)                       Personal Care Assistance Level of Assistance  Bathing, Feeding, Dressing, Total care Bathing Assistance: Maximum assistance Feeding assistance: Maximum assistance Dressing Assistance: Maximum assistance Total Care Assistance: Maximum assistance   Functional Limitations Info  Sight, Hearing, Speech Sight Info: Impaired Hearing Info: Impaired Speech Info: Adequate    SPECIAL CARE FACTORS FREQUENCY  PT (By licensed PT), OT (By licensed OT)     PT Frequency: 5x/wk OT Frequency: 5x/wk            Contractures Contractures Info: Not present    Additional Factors Info  Code Status, Allergies Code Status Info: DNR Allergies Info: : Fluorescein, Hydralazine Hcl, Sulfa Antibiotics, Ultram Tramadol, Yellow Dye, Buprenex Buprenorphine Hcl, Keflex Cephalexin, Lipitor Atorvastatin, Restasis Cyclosporine, Suprax Cefixime, Augmentin Amoxicillin-pot Clavulanate, Levofloxacin, Morphine And Related, Percocet Oxycodone-acetaminophen, Tape, Zetia Ezetimibe           Current Medications (05/07/2018):  This is the current hospital active medication list Current Facility-Administered Medications  Medication Dose Route Frequency Provider Last Rate Last Dose  . acetaminophen (TYLENOL) tablet 650 mg  650 mg  Oral Q6H PRN Etta Quill, DO       Or  . acetaminophen (TYLENOL) suppository 650 mg  650 mg Rectal Q6H PRN Etta Quill, DO   650 mg at 04/27/18 0049  . dextrose 5 %-0.9 % sodium chloride infusion   Intravenous Continuous Tawni Millers, MD  50 mL/hr at 05/07/18 1341 50 mL/hr at 05/07/18 1341  . diltiazem (CARDIZEM CD) 24 hr capsule 180 mg  180 mg Oral Daily Arrien, Jimmy Picket, MD      . donepezil (ARICEPT) tablet 7.5 mg  7.5 mg Oral BH-q7a Purohit, Shrey C, MD   7.5 mg at 05/07/18 0929  . feeding supplement (ENSURE ENLIVE) (ENSURE ENLIVE) liquid 237 mL  237 mL Oral BID BM Rizwan, Saima, MD   237 mL at 05/07/18 0930  . lacosamide (VIMPAT) tablet 200 mg  200 mg Oral BID Arrien, Jimmy Picket, MD      . LORazepam (ATIVAN) injection 1 mg  1 mg Intravenous Q6H PRN Rosalin Hawking, MD      . ondansetron [facility]) tablet 4 mg  4 mg Oral Q6H PRN Etta Quill, DO       Or  . ondansetron [facility]) injection 4 mg  4 mg Intravenous Q6H PRN Etta Quill, DO      . prednisoLONE acetate (PRED FORTE) 1 % ophthalmic suspension 1 drop  1 drop Both Eyes QHS Jennette Kettle M, DO   1 drop at 05/06/18 2202  . risperiDONE (RISPERDAL) tablet 2 mg  2 mg Oral QHS Arrien, Jimmy Picket, MD      . Rivaroxaban [facility]) tablet 15 mg  15 mg Oral BID WC Arrien, Jimmy Picket, MD       Followed by  . [START ON 05/28/2018] rivaroxaban (XARELTO) tablet 20 mg  20 mg Oral Q supper Arrien, Jimmy Picket, MD      . sertraline (ZOLOFT) tablet 150 mg  150 mg Oral BH-q7a Purohit, Konrad Dolores, MD   150 mg at 05/07/18 0930     Discharge Medications: Please see discharge summary for a list of discharge medications.  Relevant Imaging Results:  Relevant Lab Results:   Additional Information SSN: [id number]  Philippa Chester Jerald Villalona, LCSWA

## 2018-05-07 NOTE — Progress Notes (Signed)
PROGRESS NOTE    [name] [name]  FTD:[id number] DOB: 02/12/1925 DOA: 04/26/2018 PCP: Darcus Austin, MD (Inactive)    Brief Narrative:  85 year old female who presented with seizures.  She does have significant past medical history for paroxysmal atrial fibrillation, vascular dementia, left eye blindness and hypertension.  Apparently she developed a seizure at home, and was brought by EMS to the hospital.  On her initial physical examination her blood pressure was 167/82, heart rate 92, respiratory rate 20-33, oxygen saturation 95%, she was cachectic, nonresponsive, moist mucous membranes, lungs with coarse breath sounds bilaterally, heart S1-S2 present and rhythmic, abdomen soft nontender, her Glasgow Coma Scale was 3.  Sodium was 138, potassium 4.9, chloride 102, bicarb 18, glucose 161, BUN 18, creatinine 0.8, AST 47, ALT 18, alcohol level less than 10, white count 6.3, hemoglobin 13.4, hematocrit 43.7, platelets 209.  CT head with no acute changes, positive atherosclerosis.  Chest x-ray had left rotation, no infiltrates.  EKG was sinus rhythm, first-degree AV block.  Patient was admitted to the hospital with working diagnosis of status epilepticus.  Patient has been loaded with antiseizure agents, she was diagnosed with acute hypoxic respiratory failure due to pulmonary embolism, her mentation has been poor.  She remained hemodynamically stable and transferred out of the progressive care unit.   Assessment & Plan:   Principal Problem:   Non-convulsive status epilepticus ([place]) Active Problems:   History of Rt brain stroke Oct 2015   Essential hypertension   Blind left eye- (Fuch's disease)   PAF (paroxysmal atrial fibrillation) (HCC)   Vascular dementia with behavior disturbance (HCC)   Dysphagia   History of GI bleed   Tachypnea   1. Uncontrolled seizures with metabolic encephalopathy. Patient continue to be very somnolent today, at baseline she is more awake and alert per her daughter  and care giver. Will continue anti-seizure regimen with lacosamide, continue neuro checks per unit protocol and aspiration precautions. Continue physical therapy evaluation and out of bed as tolerated. For now will continue slow rate of IV fluids with dextrose and saline   2. Acute hypoxic respiratory failure due to pulmonary embolism. Patient has tolerated well IV heparin, will transition to oral anticoagulation with rivaroxaban, Continue oxymetry monitoring and as needed supplemental 02 per [place].   3. Paroxysmal atrial fibrillation. Will resume diltiazem for better rate control and will continue anticoagulation with rivaroxaban. Hold on telemetry to prevent further agitation.   4. Hx CVA with vascular dementia. Patient continue to be poorly responsive, continue supportive medical therapy, fall and aspiration precautions, continue physical therapy evaluation. Continue donepezil, risperidone and sertraline.    5. HTN. Continue blood pressure control     DVT prophylaxis: rivaroxaban  Code Status:  dnr  Family Communication: I spoke with patient's family at the bedside and all questions were addressed.   Disposition Plan/ discharge barriers: pending clinical improvement.   Body mass index is 31.53 kg/m. Malnutrition Type:      Malnutrition Characteristics:      Nutrition Interventions:     RN Pressure Injury Documentation:     Consultants:     Procedures:     Antimicrobials:       Subjective: Patient this am is somnolent, but apparently was awake this am and able to tolerate po meals, per her care giver who is at the bedside. Patient has 24 H care at home. Not yet back to baseline mentation but improved per care giver report.    Objective: Vitals:   05/06/18 2124 05/07/18  0052 05/07/18 0811 05/07/18 0900  BP: (!) 132/47 (!) 111/38 (!) 116/51 (!) 162/67  Pulse: 86 79 74 (!) 103  Resp: 16 (!) 23 (!) 24 (!) 24  Temp:  98.2 F (36.8 C) 98.6 F (37 C)   TempSrc:   Axillary Axillary   SpO2: 94% 93% 94% 92%  Weight:      Height:        Intake/Output Summary (Last 24 hours) at 05/07/2018 1010 Last data filed at 05/07/2018 0900 Gross per 24 hour  Intake 1115 ml  Output -  Net 1115 ml   Filed Weights   04/26/18 1700 04/26/18 1835  Weight: 78.2 kg 78.2 kg    Examination:   General: deconditioned and ill looking appearing.  Neurology: somnolent and poorly reactive, not opening eyes or following commands.  E ENT: mild pallor, no icterus, oral mucosa moist Cardiovascular: No JVD. S1-S2 present, rhythmic, no gallops, rubs, or murmurs. Non pitting lower extremity edema. Pulmonary: positive breath sounds bilaterally, no wheezing, rhonchi or rales, on anterior auscultation.  Gastrointestinal. Abdomen with no organomegaly, non tender, no rebound or guarding Skin. No rashes Musculoskeletal: no joint deformities     Data Reviewed: I have personally reviewed following labs and imaging studies  CBC: Recent Labs  Lab 05/03/18 1601 05/04/18 0601 05/05/18 0519 05/06/18 0613 05/07/18 0552  WBC 10.2 10.8* 7.5 8.4 10.4  HGB 14.0 13.2 11.7* 10.0* 8.0*  HCT 45.2 42.7 38.4 32.3* 25.4*  MCV 93.8 94.5 94.8 94.7 95.8  PLT 151 134* 107* 126* 026   Basic Metabolic Panel: Recent Labs  Lab 05/03/18 0642 05/04/18 0601 05/05/18 0519 05/06/18 0613 05/07/18 0552  NA 141 144 140 143 143  K 3.9 4.2 4.1 4.1 3.4*  CL 103 107 107 110 114*  CO2 26 29 24 27 26   GLUCOSE 138* 119* 101* 113* 152*  BUN 17 18 16  24* 24*  CREATININE 0.72 0.69 0.59 0.68 0.57  CALCIUM 9.1 8.4* 8.4* 8.9 8.7*   GFR: Estimated Creatinine Clearance: 44.3 mL/min (by C-G formula based on SCr of 0.57 mg/dL). Liver Function Tests: Recent Labs  Lab 05/04/18 1015 05/07/18 0552  AST 32 33  ALT 25 24  ALKPHOS 74 80  BILITOT 0.6 0.5  PROT 5.2* 4.6*  ALBUMIN 2.5* 2.2*   No results for input(s): LIPASE, AMYLASE in the last 168 hours. Recent Labs  Lab 05/04/18 1015 05/05/18 1952  05/07/18 0552  AMMONIA 78* 32 36*   Coagulation Profile: No results for input(s): INR, PROTIME in the last 168 hours. Cardiac Enzymes: No results for input(s): CKTOTAL, CKMB, CKMBINDEX, TROPONINI in the last 168 hours. BNP (last 3 results) No results for input(s): PROBNP in the last 8760 hours. HbA1C: No results for input(s): HGBA1C in the last 72 hours. CBG: Recent Labs  Lab 05/04/18 1113  GLUCAP 138*   Lipid Profile: No results for input(s): CHOL, HDL, LDLCALC, TRIG, CHOLHDL, LDLDIRECT in the last 72 hours. Thyroid Function Tests: No results for input(s): TSH, T4TOTAL, FREET4, T3FREE, THYROIDAB in the last 72 hours. Anemia Panel: No results for input(s): VITAMINB12, FOLATE, FERRITIN, TIBC, IRON, RETICCTPCT in the last 72 hours.    Radiology Studies: I have reviewed all of the imaging during this hospital visit personally     Scheduled Meds: . donepezil  7.5 mg Oral BH-q7a  . feeding supplement (ENSURE ENLIVE)  237 mL Oral BID BM  . lactulose  30 g Oral BID  . prednisoLONE acetate  1 drop Both Eyes QHS  .  sertraline  150 mg Oral BH-q7a   Continuous Infusions: . dextrose 5 % and 0.9% NaCl 60 mL/hr at 05/06/18 1605  . heparin 950 Units/hr (05/05/18 2033)  . lacosamide (VIMPAT) IV 200 mg (05/07/18 0928)     LOS: 11 days         Gerome Apley, MD

## 2018-05-07 NOTE — Clinical Social Work Note (Signed)
Clinical Social Work Assessment  Patient Details  Name: [name] [name] MRN: [id number] Date of Birth: 03/11/1925  Date of referral:  05/07/18               Reason for consult:  Discharge Planning                Permission sought to share information with:  Case Manager Permission granted to share information::  Yes, Verbal Permission Granted  Name::     [name] and Steve  Agency::     Relationship::  Son and Daughter  Contact Information:     Housing/Transportation Living arrangements for the past 2 months:  Single Family Home Source of Information:  Adult Children Patient Interpreter Needed:  None Criminal Activity/Legal Involvement Pertinent to Current Situation/Hospitalization:  No - Comment as needed Significant Relationships:  Adult Children Lives with:  Self, Adult Children Do you feel safe going back to the place where you live?  Yes Need for family participation in patient care:  Yes (Comment)(Patient is disorientated x4)  Care giving concerns:    Patient is disorientated x 4.    Social Worker assessment / plan:    CSW met with family at bedside. Patient's son, daughter, and daughter in law were present. Both patient's son and daughter help make medical decisions. Patient's family expressed that they would like to take the patient home. They are declining skilled nursing at this point. They believe that they can care for the patient better at home while receiving home health services. CSW referred the family to speak with case management.   Employment status:  Retired Insurance information:  Managed Medicare PT Recommendations:  Skilled Nursing Facility Information / Referral to community resources:  Skilled Nursing Facility  Patient/Family's Response to care:  Family is understanding of patient's current medical condition.   Patient/Family's Understanding of and Emotional Response to Diagnosis, Current Treatment, and Prognosis:  Patient's family is supportive of the  patient returning home with home health services.   Emotional Assessment Appearance:  Appears older than stated age Attitude/Demeanor/Rapport:    Affect (typically observed):  Unable to Assess Orientation:  Fluctuating Orientation (Suspected and/or reported Sundowners) Alcohol / Substance use:  Not Applicable Psych involvement (Current and /or in the community):  No (Comment)  Discharge Needs  Concerns to be addressed:  Decision making concerns Readmission within the last 30 days:    Current discharge risk:  Dependent with Mobility, Cognitively Impaired Barriers to Discharge:  No Barriers Identified    B , LCSWA 05/07/2018, 2:43 PM  

## 2018-05-08 DIAGNOSIS — G9341 Metabolic encephalopathy: Secondary | ICD-10-CM

## 2018-05-08 LAB — CBC
HCT: 22.2 % — ABNORMAL LOW (ref 36.0–46.0)
Hemoglobin: 6.6 g/dL — CL (ref 12.0–15.0)
MCH: 29.3 pg (ref 26.0–34.0)
MCHC: 29.7 g/dL — ABNORMAL LOW (ref 30.0–36.0)
MCV: 98.7 fL (ref 80.0–100.0)
PLATELETS: 203 10*3/uL (ref 150–400)
RBC: 2.25 MIL/uL — ABNORMAL LOW (ref 3.87–5.11)
RDW: 14.8 % (ref 11.5–15.5)
WBC: 11.2 10*3/uL — ABNORMAL HIGH (ref 4.0–10.5)
nRBC: 0.4 % — ABNORMAL HIGH (ref 0.0–0.2)

## 2018-05-08 LAB — BASIC METABOLIC PANEL
Anion gap: 7 (ref 5–15)
BUN: 21 mg/dL (ref 8–23)
CALCIUM: 9.3 mg/dL (ref 8.9–10.3)
CO2: 25 mmol/L (ref 22–32)
Chloride: 109 mmol/L (ref 98–111)
Creatinine, Ser: 0.55 mg/dL (ref 0.44–1.00)
GFR calc Af Amer: >60 mL/min (ref >60)
GFR calc non Af Amer: >60 mL/min (ref >60)
Glucose, Bld: 139 mg/dL — ABNORMAL HIGH (ref 70–99)
Potassium: 4 mmol/L (ref 3.5–5.1)
Sodium: 141 mmol/L (ref 135–145)

## 2018-05-08 LAB — HEMOGLOBIN AND HEMATOCRIT, BLOOD
HCT: 22 % — ABNORMAL LOW (ref 36.0–46.0)
Hemoglobin: 6.7 g/dL — CL (ref 12.0–15.0)

## 2018-05-08 LAB — ABO/RH: ABO/RH(D): A POS

## 2018-05-08 LAB — PREPARE RBC (CROSSMATCH)

## 2018-05-08 MED ORDER — DILTIAZEM HCL 30 MG PO TABS
90.0000 mg | ORAL_TABLET | Freq: Two times a day (BID) | ORAL | Status: DC
  Administered 2018-05-08 – 2018-05-11 (×6): 90 mg via ORAL
  Filled 2018-05-08 (×6): qty 3

## 2018-05-08 MED ORDER — SODIUM CHLORIDE 0.9 % IV BOLUS
500.0000 mL | Freq: Once | INTRAVENOUS | Status: AC
  Administered 2018-05-08: 500 mL via INTRAVENOUS

## 2018-05-08 MED ORDER — FAMOTIDINE IN NACL 20-0.9 MG/50ML-% IV SOLN
20.0000 mg | INTRAVENOUS | Status: DC
  Administered 2018-05-08: 20 mg via INTRAVENOUS
  Filled 2018-05-08: qty 50

## 2018-05-08 MED ORDER — PANTOPRAZOLE SODIUM 40 MG IV SOLR
40.0000 mg | Freq: Two times a day (BID) | INTRAVENOUS | Status: DC
  Administered 2018-05-08 – 2018-05-09 (×2): 40 mg via INTRAVENOUS
  Filled 2018-05-08 (×2): qty 40

## 2018-05-08 MED ORDER — SODIUM CHLORIDE 0.9% IV SOLUTION: Freq: Once | INTRAVENOUS | Status: DC

## 2018-05-08 MED ORDER — KETOROLAC TROMETHAMINE 30 MG/ML IJ SOLN
15.0000 mg | Freq: Once | INTRAMUSCULAR | Status: AC
  Administered 2018-05-08: 15 mg via INTRAVENOUS
  Filled 2018-05-08: qty 1

## 2018-05-08 NOTE — Progress Notes (Signed)
PROGRESS NOTE    [name] [name]  QVZ:[id number] DOB: [birth year] DOA: 04/26/2018 PCP: Darcus Austin, MD (Inactive)    Brief Narrative:  85 year old female who presented with seizures.  She does have significant past medical history for paroxysmal atrial fibrillation, vascular dementia, left eye blindness and hypertension.  Apparently she developed multiple generalized seizures at home, and was brought by EMS to the hospital.  In the ED continue to have seizure activity. On her initial physical examination her blood pressure was 167/82, heart rate 92, respiratory rate 20-33, oxygen saturation 95%, she was cachectic, nonresponsive, moist mucous membranes, lungs with coarse breath sounds bilaterally, heart S1-S2 present and rhythmic, abdomen soft nontender, her Glasgow Coma Scale was 3.  Sodium was 138, potassium 4.9, chloride 102, bicarb 18, glucose 161, BUN 18, creatinine 0.8, AST 47, ALT 18, alcohol level less than 10, white count 6.3, hemoglobin 13.4, hematocrit 43.7, platelets 209.  CT head with no acute changes, positive atherosclerosis.  Chest x-ray had left rotation, no infiltrates.  EKG was sinus rhythm, first-degree AV block.  Patient was admitted to the hospital with working diagnosis of status epilepticus.  Patient has been loaded with antiseizure agents, she was diagnosed with acute hypoxic respiratory failure due to pulmonary embolism, her mentation has been poor.  She remained hemodynamically stable and transferred out of the progressive care unit.    Assessment & Plan:   Principal Problem:   Non-convulsive status epilepticus ([place]) Active Problems:   History of Rt brain stroke Oct 2015   Essential hypertension   Blind left eye- (Fuch's disease)   PAF (paroxysmal atrial fibrillation) (HCC)   Vascular dementia with behavior disturbance (HCC)   Dysphagia   History of GI bleed   Tachypnea   1. Uncontrolled seizures with metabolic encephalopathy. This am patient poorly  interactive, has been eating with maximal assistance, from her daughter. No clinical signs of recurrent seizure, will continue  lacosamide per neurology recommendations. Patient with poor prognosis, will continue neuro checks and aspiration precautions, she may need snf at discharge.   2. Acute anemia (new), suspected acute blood loss/ GI bleeding. Today hb down to 6,6, patient has history of GI bleed in the past, will hold on rivaroxaban and will transfuse 1 unit PRBC. Continue cell count monitoring, will add pantoprazole IV for now. Patient likely not a good candidate for endoscopy due to altered mentation and encephalopathy. Notes reviewed from the GI clinic on 10/2017.   2. Acute hypoxic respiratory failure due to pulmonary embolism. Will continue oxymetry monitoring, supplemental 02 per [place], due to acute anemia will hold on anticoagulation for now.   3. Paroxysmal atrial fibrillation. Change diltiazem to short acting in order to be crushed, patient off anticoagulation for now due to acute anemia and suspected bleeding. Her hart rate has been in the 100's.   4. Hx CVA with vascular dementia.  Continue donepezil, risperidone and sertraline. Patient continue encephalopathic, likely hypoactive delirium, will continue supportive medical care, including IV fluids with dextrose.   5. HTN. Continue blood pressure control with diltiazem.    DVT prophylaxis: holding anticoagulation.   Code Status:  dnr  Family Communication: I spoke with patient's family at the bedside and all questions were addressed.  Disposition Plan/ discharge barriers: Pending clinical improvement.   Body mass index is 31.53 kg/m. Malnutrition Type:      Malnutrition Characteristics:      Nutrition Interventions:     RN Pressure Injury Documentation:     Consultants:  Procedures:     Antimicrobials:       Subjective: Patient continue to be hyporeactive, mumbling sounds this am, eating with  assistance, continue to be not much interactive. Her daughter is at the bedside.   Objective: Vitals:   05/08/18 0010 05/08/18 0202 05/08/18 0509 05/08/18 0916  BP: (!) 134/51  (!) 114/42 (!) 118/44  Pulse: 100  81 90  Resp: 20  20   Temp: 100.1 F (37.8 C) 99.7 F (37.6 C)  98.5 F (36.9 C)  TempSrc: Oral Oral  Oral  SpO2: 92%  94% 92%  Weight:      Height:        Intake/Output Summary (Last 24 hours) at 05/08/2018 0932 Last data filed at 05/07/2018 1700 Gross per 24 hour  Intake 360 ml  Output 450 ml  Net -90 ml   Filed Weights   04/26/18 1700 04/26/18 1835  Weight: 78.2 kg 78.2 kg    Examination:   General: deconditioned and ill looking appearing  Neurology: eyes closed, not following commands, mumbling sounds.  E ENT: positive pallor, no icterus, oral mucosa moist Cardiovascular: No JVD. S1-S2 present, rhythmic, no gallops, rubs, or murmurs. Trace lower extremity edema. Pulmonary: positive breath sounds bilaterally, poor inspiratory effort, no wheezing, rhonchi or rales, on anterior auscultation.  Gastrointestinal. Abdomen with no organomegaly, non tender, no rebound or guarding Skin. No rashes Musculoskeletal: no joint deformities     Data Reviewed: I have personally reviewed following labs and imaging studies  CBC: Recent Labs  Lab 05/03/18 1601 05/04/18 0601 05/05/18 0519 05/06/18 0613 05/07/18 0552  WBC 10.2 10.8* 7.5 8.4 10.4  HGB 14.0 13.2 11.7* 10.0* 8.0*  HCT 45.2 42.7 38.4 32.3* 25.4*  MCV 93.8 94.5 94.8 94.7 95.8  PLT 151 134* 107* 126* 035   Basic Metabolic Panel: Recent Labs  Lab 05/04/18 0601 05/05/18 0519 05/06/18 0613 05/07/18 0552 05/08/18 0823  NA 144 140 143 143 141  K 4.2 4.1 4.1 3.4* 4.0  CL 107 107 110 114* 109  CO2 29 24 27 26 25   GLUCOSE 119* 101* 113* 152* 139*  BUN 18 16 24* 24* 21  CREATININE 0.69 0.59 0.68 0.57 0.55  CALCIUM 8.4* 8.4* 8.9 8.7* 9.3   GFR: Estimated Creatinine Clearance: 44.3 mL/min (by C-G  formula based on SCr of 0.55 mg/dL). Liver Function Tests: Recent Labs  Lab 05/04/18 1015 05/07/18 0552  AST 32 33  ALT 25 24  ALKPHOS 74 80  BILITOT 0.6 0.5  PROT 5.2* 4.6*  ALBUMIN 2.5* 2.2*   No results for input(s): LIPASE, AMYLASE in the last 168 hours. Recent Labs  Lab 05/04/18 1015 05/05/18 1952 05/07/18 0552  AMMONIA 78* 32 36*   Coagulation Profile: No results for input(s): INR, PROTIME in the last 168 hours. Cardiac Enzymes: No results for input(s): CKTOTAL, CKMB, CKMBINDEX, TROPONINI in the last 168 hours. BNP (last 3 results) No results for input(s): PROBNP in the last 8760 hours. HbA1C: No results for input(s): HGBA1C in the last 72 hours. CBG: Recent Labs  Lab 05/04/18 1113  GLUCAP 138*   Lipid Profile: No results for input(s): CHOL, HDL, LDLCALC, TRIG, CHOLHDL, LDLDIRECT in the last 72 hours. Thyroid Function Tests: No results for input(s): TSH, T4TOTAL, FREET4, T3FREE, THYROIDAB in the last 72 hours. Anemia Panel: Recent Labs    05/07/18 0552  VITAMINB12 1,295*  FOLATE 9.1      Radiology Studies: I have reviewed all of the imaging during this hospital visit personally  Scheduled Meds: . diltiazem  180 mg Oral Daily  . donepezil  7.5 mg Oral BH-q7a  . feeding supplement (ENSURE ENLIVE)  237 mL Oral BID BM  . lacosamide  200 mg Oral BID  . prednisoLONE acetate  1 drop Both Eyes QHS  . risperiDONE  2 mg Oral QHS  . rivaroxaban  15 mg Oral BID WC   Followed by  . [START ON 05/28/2018] rivaroxaban  20 mg Oral Q supper  . sertraline  150 mg Oral BH-q7a   Continuous Infusions: . dextrose 5 % and 0.9% NaCl 50 mL/hr at 05/08/18 0925     LOS: 12 days        Myka Lukins Gerome Apley, MD

## 2018-05-08 NOTE — Progress Notes (Signed)
1500 Report given to charge RN, Nira Conn. Pt sleeping, son at bedside, updated with POC, NAD, fall precautions in place.

## 2018-05-08 NOTE — Progress Notes (Addendum)
MD notified of Pt's BP during 15 minutes of blood transfusion. MD advised continue infusion

## 2018-05-09 LAB — BASIC METABOLIC PANEL
Anion gap: 6 (ref 5–15)
BUN: 23 mg/dL (ref 8–23)
CO2: 23 mmol/L (ref 22–32)
Calcium: 9.2 mg/dL (ref 8.9–10.3)
Chloride: 112 mmol/L — ABNORMAL HIGH (ref 98–111)
Creatinine, Ser: 0.69 mg/dL (ref 0.44–1.00)
GFR calc Af Amer: >60 mL/min (ref >60)
GFR calc non Af Amer: >60 mL/min (ref >60)
Glucose, Bld: 119 mg/dL — ABNORMAL HIGH (ref 70–99)
Potassium: 4.9 mmol/L (ref 3.5–5.1)
Sodium: 141 mmol/L (ref 135–145)

## 2018-05-09 LAB — CBC
HEMATOCRIT: 26.6 % — AB (ref 36.0–46.0)
Hemoglobin: 8.1 g/dL — ABNORMAL LOW (ref 12.0–15.0)
MCH: 29.2 pg (ref 26.0–34.0)
MCHC: 30.5 g/dL (ref 30.0–36.0)
MCV: 96 fL (ref 80.0–100.0)
Platelets: 200 10*3/uL (ref 150–400)
RBC: 2.77 MIL/uL — ABNORMAL LOW (ref 3.87–5.11)
RDW: 16.1 % — ABNORMAL HIGH (ref 11.5–15.5)
WBC: 12 10*3/uL — ABNORMAL HIGH (ref 4.0–10.5)
nRBC: 0.7 % — ABNORMAL HIGH (ref 0.0–0.2)

## 2018-05-09 LAB — TYPE AND SCREEN
ABO/RH(D): A POS
Antibody Screen: NEGATIVE
UNIT DIVISION: 0

## 2018-05-09 LAB — BPAM RBC
Blood Product Expiration Date: 202003102359
ISSUE DATE / TIME: 202002162211
Unit Type and Rh: 6200

## 2018-05-09 MED ORDER — PANTOPRAZOLE SODIUM 40 MG PO PACK
40.0000 mg | PACK | Freq: Two times a day (BID) | ORAL | Status: DC
  Administered 2018-05-09 – 2018-05-10 (×2): 40 mg via ORAL
  Filled 2018-05-09 (×2): qty 20

## 2018-05-09 NOTE — Progress Notes (Signed)
No transfusion reactions noted.

## 2018-05-09 NOTE — Progress Notes (Signed)
OT Cancellation Note  Patient Details Name: [name] [name] MRN: [id number] DOB: 14-Mar-1925   Cancelled Treatment:    Reason Eval/Treat Not Completed: Patient's level of consciousness.  Pt remains with eyes closed and decreased ability to participate actively in OT session.  Will continue to check back for appropriateness.   Cheri Ayotte OTR/L 05/09/2018, 2:49 PM

## 2018-05-09 NOTE — Progress Notes (Signed)
Physical Therapy Treatment Patient Details Name: [name] [name] MRN: [id number] DOB: [birth year] Today's Date: 05/09/2018    History of Present Illness Patient is a 85 y/o female who presents with seizures. PMH includes dementia, CAd s/p NSTEMI 2015 (medical management), HTN, DVT, CVA with left eye blindness. Chest CT 05/02/18 revealed bilat PEs. Doppler 05/03/18 revealed acute DVT LLE.    PT Comments    Patient received in bed, daughter present. Had just spoken with Hospice rep. Patient verbalizing but not making sense. Cannot follow commands, when attempted to perform supine to sit transfer patient became agitated, resisting mobility verbally and physically. Patient returned to supine and treatment halted.  Patient does not participate with therapy at this time and is not appropriate for continued skilled PT. If patient becomes more alert, able to follow direction, and participate we will need new order placed. It is recommended that patient be assisted to chair with lift via nursing staff.        Follow Up Recommendations  SNF     Equipment Recommendations  Hospital bed    Recommendations for Other Services       Precautions / Restrictions Precautions Precautions: Fall Precaution Comments: blind, dependent transfers Restrictions Weight Bearing Restrictions: No    Mobility  Bed Mobility Overal bed mobility: Needs Assistance   Rolling: Total assist Sidelying to sit: Total assist Supine to sit: Total assist Sit to supine: Total assist   General bed mobility comments: no active participation with mobility, actually resisting and became very aggitated this session with attempts to sit at EOB.   Transfers                    Ambulation/Gait                 Stairs             Wheelchair Mobility    Modified Rankin (Stroke Patients Only)       Balance               Standing balance comment: unable to assess.                            Cognition Arousal/Alertness: Lethargic   Overall Cognitive Status: Impaired/Different from baseline                                 General Comments: speaking nonsensical       Exercises      General Comments        Pertinent Vitals/Pain Pain Assessment: Faces Faces Pain Scale: No hurt    Home Living Family/patient expects to be discharged to:: Private residence Living Arrangements: Children;Alone Available Help at Discharge: Family;Available 24 hours/day;Personal care attendant Type of Home: House Home Access: Stairs to enter   Home Layout: Multi-level Home Equipment: Tub bench Additional Comments: split level, 7 up/6 down to get to bathroom, could set her up in downstairs, recommend hospital bed    Prior Function Level of Independence: Needs assistance  Gait / Transfers Assistance Needed: needs to be led due to vision deficits, but otherwise walks independently ADL's / Homemaking Assistance Needed: was toileting independently and stepping into shower 1 week ago Comments: has 24/7 caregivers   PT Goals (current goals can now be found in the care plan section) Acute Rehab PT Goals Patient Stated Goal: None stated PT Goal Formulation: With  family Time For Goal Achievement: 05/15/18 Potential to Achieve Goals: Poor    Frequency           PT Plan      Co-evaluation              AM-PAC PT "6 Clicks" Mobility   Outcome Measure  Help needed turning from your back to your side while in a flat bed without using bedrails?: Total Help needed moving from lying on your back to sitting on the side of a flat bed without using bedrails?: Total Help needed moving to and from a bed to a chair (including a wheelchair)?: Total Help needed standing up from a chair using your arms (e.g., wheelchair or bedside chair)?: Total Help needed to walk in hospital room?: Total Help needed climbing 3-5 steps with a railing? : Total 6 Click Score: 6    End  of Session Equipment Utilized During Treatment: Oxygen Activity Tolerance: Treatment limited secondary to medical complications (Comment);Patient limited by lethargy;Treatment limited secondary to agitation Patient left: in bed;with call bell/phone within reach;with family/visitor present Nurse Communication: Mobility status;Need for lift equipment PT Visit Diagnosis: Muscle weakness (generalized) (M62.81)     Time: 1445-1500 PT Time Calculation (min) (ACUTE ONLY): 15 min  Charges:  $Therapeutic Activity: 8-22 mins                     Quintana Canelo, PT, GCS 05/09/18,3:19 PM

## 2018-05-09 NOTE — Progress Notes (Signed)
PT Cancellation Note  Patient Details Name: [name] [name] MRN: [id number] DOB: 12-24-1924   Cancelled Treatment:    Reason Eval/Treat Not Completed: Patient's level of consciousness- unable to arouse patient to participate with PT. Will re-attempt later today.   Keo Schirmer 05/09/2018, 10:11 AM

## 2018-05-09 NOTE — Progress Notes (Signed)
PROGRESS NOTE    [name] [name]  OEU:[id number] DOB: 1924-07-23 DOA: 04/26/2018 PCP: Darcus Austin, MD (Inactive)    Brief Narrative:  85 year old female who presented with seizures. She does have significant past medical history for paroxysmal atrial fibrillation, vascular dementia, left eye blindness and hypertension. Apparently she developed multiple generalized seizures at home, and was brought by EMS to the hospital. In the ED continue to have seizure activity. On her initial physical examination herblood pressurewas167/82, heart rate 92, respiratory rate 20-33, oxygen saturation 95%, she was cachectic, nonresponsive, moist mucous membranes, lungs with coarse breath sounds bilaterally, heart S1-S2 present and rhythmic, abdomen soft nontender, her Glasgow Coma Scale was 3.Sodium was 138, potassium 4.9, chloride 102, bicarb 18, glucose 161, BUN 18, creatinine 0.8, AST 47, ALT 18, alcohol levelless than 10,white count 6.3, hemoglobin 13.4, hematocrit 43.7, platelets 209.CT head with no acute changes, positive atherosclerosis. Chest x-ray had left rotation, no infiltrates. EKG was sinus rhythm, first-degree AV block.  Patient was admitted to the hospital with working diagnosis of status epilepticus.  Patient has been loaded with antiseizure agents, she was diagnosed with acute hypoxic respiratory failure due to pulmonary embolism, her mentation has been poor. She remained hemodynamically stable and transferred out of the progressive care unit.     Assessment & Plan:   Principal Problem:   Non-convulsive status epilepticus ([place]) Active Problems:   History of Rt brain stroke Oct 2015   Essential hypertension   Blind left eye- (Fuch's disease)   PAF (paroxysmal atrial fibrillation) (HCC)   Vascular dementia with behavior disturbance (HCC)   Dysphagia   History of GI bleed   Tachypnea   Acute metabolic encephalopathy  1. Uncontrolled seizures with metabolic  encephalopathy/ hypoactive delirium. Patient continue to be poorly responsive, will continue  lacosamide per neurology recommendations. She does have a very poor prognosis, extensive structural brain damage from old CVAs, now complicated with seizures and hypoactive delirium.   2. Acute anemia (new), suspected acute blood loss/ upper GI bleeding. Patient is sp 1 unit PRB, hb stable at 8. Will continue to hold on anticoagulation due to highly suspected gastrointestinal bleeding, will continue IV pantoprazole. Poor prognosis and not candidate for endoscopic procedure.   2. Acute hypoxic respiratory failure due to pulmonary embolism. Continue oxymetry monitoring and supplemental 02 per [place], aspiration precautions.  3. Paroxysmal atrial fibrillation. Continue diltiazem as tolerated for rate control, no anticoagulation due to acute anemia.   4.Hx CVA with vascular dementia.  On donepezil, risperidone and sertraline. For now will continue IV dextorse, patient has very poor prognosis.  5. HTN. On diltiazem po.   Patient is poor prognosis, not able to anticoagulate due to acute anemia, patient high risk of rapid deterioration with no treatment for pulmonary embolism. I spoke with her daughter at the bedside and will consult hospice services for discharge.    DVT prophylaxis:holding anticoagulation.  Code Status:dnr Family Communication:I spoke with patient's family at the bedside and all questions were addressed.  Disposition Plan/ discharge barriers: Hospice/ home or residential   Body mass index is 31.53 kg/m. Malnutrition Type:      Malnutrition Characteristics:      Nutrition Interventions:     RN Pressure Injury Documentation:     Consultants:     Procedures:     Antimicrobials:       Subjective: Patient continue to be hyporeactive, no apparent hematemesis or hematochezia. Very poor oral intake, needs max assistance for feedings. On my exam opens her  eyes  to touch.   Objective: Vitals:   05/09/18 0145 05/09/18 0432 05/09/18 0552 05/09/18 0817  BP: (!) 94/47 (!) 95/43 (!) 144/53 (!) 126/46  Pulse: 69 61 82 76  Resp: (!) 25 (!) 25 (!) 23 (!) 21  Temp: 97.9 F (36.6 C) 97.9 F (36.6 C) 98.4 F (36.9 C) 98.8 F (37.1 C)  TempSrc: Axillary Axillary Axillary Oral  SpO2: 93% 94% 95% 96%  Weight:      Height:        Intake/Output Summary (Last 24 hours) at 05/09/2018 1034 Last data filed at 05/09/2018 0115 Gross per 24 hour  Intake 1063.58 ml  Output -  Net 1063.58 ml   Filed Weights   04/26/18 1700 04/26/18 1835  Weight: 78.2 kg 78.2 kg    Examination:   General: deconditioned and ill looking appearing  Neurology: very somnolent, not following commands, opens her eyes to difficulty to touch.  E ENT: mild pallor, no icterus, oral mucosa moist Cardiovascular: No JVD. S1-S2 present, rhythmic, no gallops, rubs, or murmurs. No lower extremity edema. Pulmonary: positive breath sounds bilaterally, very poor inspiratory effort, no wheezing, rhonchi or rales. Gastrointestinal. Abdomen with no organomegaly, non tender, no rebound or guarding Skin. No rashes Musculoskeletal: no joint deformities     Data Reviewed: I have personally reviewed following labs and imaging studies  CBC: Recent Labs  Lab 05/05/18 0519 05/06/18 0613 05/07/18 0552 05/08/18 0952 05/08/18 1127 05/09/18 0545  WBC 7.5 8.4 10.4 11.2*  --  12.0*  HGB 11.7* 10.0* 8.0* 6.6* 6.7* 8.1*  HCT 38.4 32.3* 25.4* 22.2* 22.0* 26.6*  MCV 94.8 94.7 95.8 98.7  --  96.0  PLT 107* 126* 165 203  --  026   Basic Metabolic Panel: Recent Labs  Lab 05/05/18 0519 05/06/18 0613 05/07/18 0552 05/08/18 0823 05/09/18 0545  NA 140 143 143 141 141  K 4.1 4.1 3.4* 4.0 4.9  CL 107 110 114* 109 112*  CO2 24 27 26 25 23   GLUCOSE 101* 113* 152* 139* 119*  BUN 16 24* 24* 21 23  CREATININE 0.59 0.68 0.57 0.55 0.69  CALCIUM 8.4* 8.9 8.7* 9.3 9.2   GFR: Estimated  Creatinine Clearance: 44.3 mL/min (by C-G formula based on SCr of 0.69 mg/dL). Liver Function Tests: Recent Labs  Lab 05/04/18 1015 05/07/18 0552  AST 32 33  ALT 25 24  ALKPHOS 74 80  BILITOT 0.6 0.5  PROT 5.2* 4.6*  ALBUMIN 2.5* 2.2*   No results for input(s): LIPASE, AMYLASE in the last 168 hours. Recent Labs  Lab 05/04/18 1015 05/05/18 1952 05/07/18 0552  AMMONIA 78* 32 36*   Coagulation Profile: No results for input(s): INR, PROTIME in the last 168 hours. Cardiac Enzymes: No results for input(s): CKTOTAL, CKMB, CKMBINDEX, TROPONINI in the last 168 hours. BNP (last 3 results) No results for input(s): PROBNP in the last 8760 hours. HbA1C: No results for input(s): HGBA1C in the last 72 hours. CBG: Recent Labs  Lab 05/04/18 1113  GLUCAP 138*   Lipid Profile: No results for input(s): CHOL, HDL, LDLCALC, TRIG, CHOLHDL, LDLDIRECT in the last 72 hours. Thyroid Function Tests: No results for input(s): TSH, T4TOTAL, FREET4, T3FREE, THYROIDAB in the last 72 hours. Anemia Panel: Recent Labs    05/07/18 0552  VITAMINB12 1,295*  FOLATE 9.1      Radiology Studies: I have reviewed all of the imaging during this hospital visit personally     Scheduled Meds: . sodium chloride   Intravenous Once  .  diltiazem  90 mg Oral Q12H  . donepezil  7.5 mg Oral BH-q7a  . feeding supplement (ENSURE ENLIVE)  237 mL Oral BID BM  . lacosamide  200 mg Oral BID  . pantoprazole (PROTONIX) IV  40 mg Intravenous Q12H  . prednisoLONE acetate  1 drop Both Eyes QHS  . risperiDONE  2 mg Oral QHS  . sertraline  150 mg Oral BH-q7a   Continuous Infusions: . dextrose 5 % and 0.9% NaCl 50 mL/hr at 05/08/18 1941     LOS: 13 days        Mauricio Gerome Apley, MD

## 2018-05-09 NOTE — Progress Notes (Signed)
CM consulted for home hospice. CM met with the patients daughter at the bedside and provided her choice of home hospice agencies. She states she has not talked the change in plans over with her siblings and needs to do this tonight. She also is not sure if the plan will be home with hospice vs [facility]. CM to speak with daughter in the am about d/c plans. MD updated.

## 2018-05-10 DIAGNOSIS — I2699 Other pulmonary embolism without acute cor pulmonale: Secondary | ICD-10-CM

## 2018-05-10 LAB — HEMOGLOBIN AND HEMATOCRIT, BLOOD
HCT: 23.6 % — ABNORMAL LOW (ref 36.0–46.0)
Hemoglobin: 7.5 g/dL — ABNORMAL LOW (ref 12.0–15.0)

## 2018-05-10 MED ORDER — PANTOPRAZOLE SODIUM 40 MG PO TBEC: 40.0000 mg | DELAYED_RELEASE_TABLET | Freq: Two times a day (BID) | ORAL | 0 refills | Status: AC

## 2018-05-10 MED ORDER — PANTOPRAZOLE SODIUM 40 MG PO TBEC: 40.0000 mg | DELAYED_RELEASE_TABLET | Freq: Two times a day (BID) | ORAL | Status: DC

## 2018-05-10 MED ORDER — DILTIAZEM HCL 90 MG PO TABS: 90.0000 mg | ORAL_TABLET | Freq: Two times a day (BID) | ORAL | 0 refills | Status: AC

## 2018-05-10 MED ORDER — LACOSAMIDE 200 MG PO TABS: 200.0000 mg | ORAL_TABLET | Freq: Two times a day (BID) | ORAL | 0 refills | Status: AC

## 2018-05-10 MED ORDER — ENSURE ENLIVE PO LIQD: 237.0000 mL | Freq: Two times a day (BID) | ORAL | 0 refills | Status: AC

## 2018-05-10 NOTE — Plan of Care (Signed)
Pt is progressing toward desired goal 

## 2018-05-10 NOTE — Progress Notes (Signed)
[facility] Liaison note.  Received request from Jacqualin Combes, [place] for family interest in [facility] with request for transfer 05/11/2018. Chart reviewed and eligibility confirmed. Met with daughter, [name] to confirm interest and explain services. Family agreeable to transfer tomorrow. Kathlee Nations, [place] aware.  Registration paper work completed. Dr. Orpah Melter  to assume care per family request.  Please fax discharge summary to [phone]. RN please call report to [phone]. Please arrange transport for patient to arrive before noon if possible.  Thank you,   Farrel Gordon, RN, [facility] Liaison  [phone]

## 2018-05-10 NOTE — Care Management Important Message (Signed)
Important Message  Patient Details  Name: [name] [name] MRN: [id number] Date of Birth: 06-02-1924   Medicare Important Message Given:  Yes    Barb Merino Jayline Kilburg 05/10/2018, 12:18 PM

## 2018-05-10 NOTE — Progress Notes (Addendum)
CM and MD met with the patients 3 children to discuss d/c plans and pts prognosis. The family has decided on [facility]. CM called Bevely Palmer with [facility] and made the referral. CSW updated.  Addendum (1610): Received call from Bevely Palmer with [facility] and they have accepted the patient and can offer her a bed in the am. MD and CSW updated.

## 2018-05-10 NOTE — Discharge Summary (Addendum)
Physician Discharge Summary  [name] [name] HYI:[id number] DOB: 1924/06/14 DOA: 04/26/2018  PCP: Darcus [place], MD (Inactive)  Admit date: 04/26/2018 Discharge date: 05/11/2018  Admitted From: Home  Disposition:  Hospice   Recommendations for Outpatient Follow-up and new medication changes:  1. Follow up with Hospice team.  2. Patient with no anticoagulation due to suspected upper gastrointestinal bleeding, very poor prognosis.   Home Health: na   Equipment/Devices: na    Discharge Condition: stable  CODE STATUS: DNR   Diet recommendation: regular as tolerated, no restrictions.   Brief/Interim Summary: 85 year old female who presented with seizures. She does have significant past medical history for paroxysmal atrial fibrillation, vascular dementia, left eye blindness and hypertension. Apparently she developedmultiple generalizedseizuresat home, and was brought by EMS to the hospital. In the ED continue to have seizure activity.On her initial physical examination herblood pressurewas167/82, heart rate 92, respiratory rate 20-33, oxygen saturation 95%, she was nonresponsive, moist mucous membranes, lungs with coarse breath sounds bilaterally, heart S1-S2 present and rhythmic, abdomen soft nontender, her Glasgow Coma Scale was 3.Sodium was 138, potassium 4.9, chloride 102, bicarb 18, glucose 161, BUN 18, creatinine 0.8, AST 47, ALT 18, alcohol levelless than 10,white count 6.3, hemoglobin 13.4, hematocrit 43.7, platelets 209.CT head with no acute changes, positive atherosclerosis. Chest x-ray had left rotation, no infiltrates. EKG was sinus rhythm, first-degree AV block.  Patient was admitted to the hospital with working diagnosis of status epilepticus.  Patient has been loaded with antiseizure agents, she was diagnosed with acute hypoxic respiratory failure due to pulmonary embolism, her mentation has been very poor. She remained hemodynamically stable and transferred out of the  progressive care unit.  Patient developed acute anemia, suspected upper GI bleed, anticoagulation was held. Very poor prognosis. Patient's family decided to continue care under hospice services.   1.  Uncontrolled seizures with status epilepticus complicated by metabolic encephalopathy and hypoactive delirium.  Patient has been on lacosamide, no evidence of clinical seizures.  She does have extensive history of brain damage due to old CVAs who represent a high risk for recurrent seizure activity.  She has remained poorly reactive, very poor oral intake, not interactive.  Continue lacosamide for seizure prophylaxis.  2.  Acute anemia suspected upper gastrointestinal bleed with acute blood loss.  Patient was placed initially on IV heparin for anticoagulation for her diagnosis of pulmonary embolism, she was transitioned to rivaroxaban.  Unfortunately her hemoglobin dropped significantly down to 6.6, anticoagulation was held, she received 1 unit packed red blood cells and received IV pantoprazole.  After PRBC transfusion her hemoglobin went up to 8.1, at discharge back down to 7.5.  Continue proton pump inhibitors as tolerated.  3.  Acute hypoxic respiratory failure due to acute pulmonary edema.  Patient was placed on supplemental oxygen per nasal cannula, unfortunately she did not tolerate anticoagulation, at this point patient has a very poor prognosis.  Continue care under hospice.  4.  Paroxysmal atrial fibrillation.  Her heart rate was controlled with AV blockade, currently on diltiazem, not candidate for anticoagulation.  5.  History of CVA with vascular dementia.  Patient will continue donepezil, risperidone and sertraline.  6.  Hypertension.  Continue diltiazem.     Discharge Diagnoses:  Principal Problem:   Non-convulsive status epilepticus [facility]) Active Problems:   History of Rt brain stroke Oct 2015   Essential hypertension   Blind left eye- (Fuch's disease)   PAF (paroxysmal atrial  fibrillation) (HCC)   Vascular dementia with behavior disturbance ([place])  Dysphagia   History of GI bleed   Tachypnea   Acute metabolic encephalopathy    Discharge Instructions   Allergies as of 05/10/2018      Reactions   Fluorescein Anaphylaxis, Shortness Of Breath, Itching, Swelling   Hydralazine Hcl Anaphylaxis, Swelling, Palpitations, Rash   Sulfa Antibiotics Rash, Other (See Comments)   Other Reaction: red bumps   Ultram [tramadol] Hives   Yellow Dye Anaphylaxis, Itching, Swelling, Other (See Comments)   Fluorescein (in eye drops)   Buprenex [buprenorphine Hcl] Palpitations, Other (See Comments)   Keflex [cephalexin] Diarrhea   Lipitor [atorvastatin] Other (See Comments)   Muscle weakness   Restasis [cyclosporine] Swelling   redness of face with slight swelling    Suprax [cefixime] Diarrhea   Augmentin [amoxicillin-pot Clavulanate] Nausea And Vomiting   Levofloxacin Other (See Comments)   Reaction not recalled   Morphine And Related Other (See Comments)   agitation    Percocet [oxycodone-acetaminophen] Nausea And Vomiting   Tape Other (See Comments)   Adhesive tape pulls off the skin Able to tolerate paper tape   Zetia [ezetimibe] Other (See Comments)   Myalgias      Medication List    STOP taking these medications   clonazePAM 0.5 MG tablet Commonly known as:  KLONOPIN   clopidogrel 75 MG tablet Commonly known as:  PLAVIX   diltiazem 180 MG 24 hr capsule Commonly known as:  CARDIZEM CD   nitroGLYCERIN 0.4 MG SL tablet Commonly known as:  NITROSTAT     TAKE these medications   diltiazem 90 MG tablet Commonly known as:  CARDIZEM Take 1 tablet (90 mg total) by mouth every 12 (twelve) hours for 30 days.   donepezil 5 MG tablet Commonly known as:  ARICEPT Take 7.5 mg by mouth every morning.   feeding supplement (ENSURE ENLIVE) Liqd Take 237 mLs by mouth 2 (two) times daily between meals for 30 days. Start taking on:  May 11, 2018    lacosamide 200 MG Tabs tablet Commonly known as:  VIMPAT Take 1 tablet (200 mg total) by mouth 2 (two) times daily for 30 days.   pantoprazole 40 MG tablet Commonly known as:  PROTONIX Take 1 tablet (40 mg total) by mouth 2 (two) times daily for 30 days. Start taking on:  May 12, 2018   prednisoLONE acetate 1 % ophthalmic suspension Commonly known as:  PRED FORTE Place 1 drop into both eyes See admin instructions. 1 drop into both eyes at bedtime spaced 5 minutes apart from Systane gel   risperiDONE 2 MG tablet Commonly known as:  RISPERDAL Take 2 mg by mouth at bedtime.   sertraline 100 MG tablet Commonly known as:  ZOLOFT Take 150 mg by mouth every morning.   traZODone 50 MG tablet Commonly known as:  DESYREL Take 25 mg by mouth at bedtime.       Allergies  Allergen Reactions  . Fluorescein Anaphylaxis, Shortness Of Breath, Itching and Swelling  . Hydralazine Hcl Anaphylaxis, Swelling, Palpitations and Rash  . Sulfa Antibiotics Rash and Other (See Comments)    Other Reaction: red bumps  . Ultram [Tramadol] Hives  . Yellow Dye Anaphylaxis, Itching, Swelling and Other (See Comments)    Fluorescein (in eye drops)  . Buprenex [Buprenorphine Hcl] Palpitations and Other (See Comments)  . Keflex [Cephalexin] Diarrhea  . Lipitor [Atorvastatin] Other (See Comments)    Muscle weakness  . Restasis [Cyclosporine] Swelling    redness of face with slight swelling   .  Suprax [Cefixime] Diarrhea  . Augmentin [Amoxicillin-Pot Clavulanate] Nausea And Vomiting  . Levofloxacin Other (See Comments)    Reaction not recalled  . Morphine And Related Other (See Comments)    agitation   . Percocet [Oxycodone-Acetaminophen] Nausea And Vomiting  . Tape Other (See Comments)    Adhesive tape pulls off the skin Able to tolerate paper tape  . Zetia [Ezetimibe] Other (See Comments)    Myalgias    Consultations:     Procedures/Studies: Ct Angio Head W Or Wo Contrast  Result  Date: 04/26/2018 CLINICAL DATA:  85 y/o F; altered mental status, left-sided gaze, bilateral weakness. EXAM: CT ANGIOGRAPHY HEAD AND NECK CT PERFUSION BRAIN TECHNIQUE: Multidetector CT imaging of the head and neck was performed using the standard protocol during bolus administration of intravenous contrast. Multiplanar CT image reconstructions and MIPs were obtained to evaluate the vascular anatomy. Carotid stenosis measurements (when applicable) are obtained utilizing NASCET criteria, using the distal internal carotid diameter as the denominator. Multiphase CT imaging of the brain was performed following IV bolus contrast injection. Subsequent parametric perfusion maps were calculated using RAPID software. CONTRAST:  123mL ISOVUE-370 IOPAMIDOL (ISOVUE-370) INJECTION 76% COMPARISON:  04/21/2018 in 01/15/2018 CT head. 09/21/2017 CT angiogram neck, CT angiogram head, CT perfusion head. 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 Small chronic infarctions in the cerebellum, right hemi pons, bilateral thalami. Small chronic cortical infarction in left posterior frontal lobe. Nonspecific white matter hypodensities compatible with chronic microvascular ischemic changes and volume loss of the brain are stable. No acute stroke, hemorrhage, mass effect, extra-axial collection, hydrocephalus, or herniation identified. Vascular: As below. Sinuses/Orbits: No acute finding. Other: Bilateral intra-ocular lens replacement. ASPECTS ([place] Stroke Program Early CT Score) - Ganglionic level infarction (caudate, lentiform nuclei, internal capsule, insula, M1-M3 cortex): 7 - Supraganglionic infarction (M4-M6 cortex): 3 Total score (0-10 with 10 being normal): 10 Review of the MIP images confirms the above findings CTA NECK FINDINGS Aortic arch: Standard branching. Imaged portion shows no evidence of aneurysm or dissection. No significant stenosis of the major arch vessel origins.  Mild calcific atherosclerosis. Right carotid system: No evidence of dissection, stenosis (50% or greater) or occlusion. Non stenotic calcified plaque of the carotid bifurcation. Left carotid system: No evidence of dissection, stenosis (50% or greater) or occlusion. Non stenotic calcified plaque of the carotid bifurcation. Vertebral arteries: Left dominant. No evidence of dissection, stenosis (50% or greater) or occlusion. Skeleton: Negative. Other neck: Negative. Upper chest: Negative. Review of the MIP images confirms the above findings CTA HEAD FINDINGS Anterior circulation: No high-grade proximal stenosis, proximal occlusion, aneurysm, or vascular malformation. The left MCA inferior division M3 branch is reconstituted, but with persistent proximal stenosis. Posterior circulation: No significant stenosis, proximal occlusion, aneurysm, or vascular malformation. There multiple segments of mild stenosis in the anterior and posterior intracranial circulation compatible with atherosclerosis, similar to prior CTA of the head. Venous sinuses: As permitted by contrast timing, patent. Anatomic variants: None significant. Delayed phase: No abnormal intracranial enhancement. Review of the MIP images confirms the above findings CT Brain Perfusion Findings: CBF (<30%) Volume: 48mL Perfusion (Tmax>6.0s) volume: 69mL, this is localized to the falx in likely misregistration artifact. Mismatch Volume: 23mL Infarction Location:Negative. Small focus of mildly decreased cerebral blood flow and increased cerebral blood volume within the left parietal lobe. IMPRESSION: CT head: 1. No acute intracranial abnormality identified. 2. ASPECTS is 10. 3. Multiple stable chronic infarctions, microvascular ischemic changes, and volume loss of the brain. CTA  neck: 1. Patent carotid and vertebral arteries. No dissection, aneurysm, or hemodynamically significant stenosis by NASCET criteria. 2. Mild calcific atherosclerosis of the carotid bifurcations  in the aorta. CTA head: 1. No new large vessel occlusion, high-grade stenosis, aneurysm, or vascular malformation. 2. Reconstitution of left MCA inferior division M3 branch, persistent stenosis of the branch origin. 3. Intracranial atherosclerosis with multiple segments of mild stenosis in the anterior and posterior circulation. CT perfusion head: 1. No acute infarction or ischemic penumbra by standard CT perfusion criteria. 2. Small focus of mildly decreased cerebral blood flow and increased cerebral blood volume in the left parietal lobe compatible with oligemia without ischemia. These results were called by telephone at the time of interpretation on 04/26/2018 at 6:30 pm to Dr. Cheral Marker, who verbally acknowledged these results. Electronically Signed   By: Kristine Garbe M.D.   On: 04/26/2018 18:33   Ct Head Wo Contrast  Result Date: 04/21/2018 CLINICAL DATA:  Altered level of consciousness, history stroke, coronary artery disease post and high, hypertension, blindness LEFT eye EXAM: CT HEAD WITHOUT CONTRAST TECHNIQUE: Contiguous axial images were obtained from the base of the skull through the vertex without intravenous contrast. Sagittal and coronal MPR images reconstructed from axial data set. COMPARISON:  01/15/2018 FINDINGS: Brain: Generalized atrophy. Normal ventricular morphology. No midline shift or mass effect. Small vessel chronic ischemic changes of deep cerebral white matter. Large old LEFT temporoparietal infarct. Old RIGHT occipital infarct. Old RIGHT thalamic infarct. No intracranial hemorrhage, mass lesion, or evidence of acute infarction. No extra-axial fluid collections. Vascular: Minimal atherosclerotic calcifications of internal carotid arteries at skull base. No hyperdense vessels. Skull: Intact Sinuses/Orbits: Clear Other: N/A IMPRESSION: Atrophy with small vessel chronic ischemic changes of deep cerebral white matter. Old LEFT temporoparietal, RIGHT occipital and RIGHT thalamic  infarcts. No acute intracranial abnormalities. Electronically Signed   By: Lavonia Dana M.D.   On: 04/21/2018 14:37   Ct Angio Neck W Or Wo Contrast  Result Date: 04/26/2018 CLINICAL DATA:  85 y/o F; altered mental status, left-sided gaze, bilateral weakness. EXAM: CT ANGIOGRAPHY HEAD AND NECK CT PERFUSION BRAIN TECHNIQUE: Multidetector CT imaging of the head and neck was performed using the standard protocol during bolus administration of intravenous contrast. Multiplanar CT image reconstructions and MIPs were obtained to evaluate the vascular anatomy. Carotid stenosis measurements (when applicable) are obtained utilizing NASCET criteria, using the distal internal carotid diameter as the denominator. Multiphase CT imaging of the brain was performed following IV bolus contrast injection. Subsequent parametric perfusion maps were calculated using RAPID software. CONTRAST:  157mL ISOVUE-370 IOPAMIDOL (ISOVUE-370) INJECTION 76% COMPARISON:  04/21/2018 in 01/15/2018 CT head. 09/21/2017 CT angiogram neck, CT angiogram head, CT perfusion head. 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 Small chronic infarctions in the cerebellum, right hemi pons, bilateral thalami. Small chronic cortical infarction in left posterior frontal lobe. Nonspecific white matter hypodensities compatible with chronic microvascular ischemic changes and volume loss of the brain are stable. No acute stroke, hemorrhage, mass effect, extra-axial collection, hydrocephalus, or herniation identified. Vascular: As below. Sinuses/Orbits: No acute finding. Other: Bilateral intra-ocular lens replacement. ASPECTS ([place] Stroke Program Early CT Score) - Ganglionic level infarction (caudate, lentiform nuclei, internal capsule, insula, M1-M3 cortex): 7 - Supraganglionic infarction (M4-M6 cortex): 3 Total score (0-10 with 10 being normal): 10 Review of the MIP images confirms the above findings CTA NECK  FINDINGS Aortic arch: Standard branching. Imaged portion shows no evidence of aneurysm or dissection. No significant stenosis of  the major arch vessel origins. Mild calcific atherosclerosis. Right carotid system: No evidence of dissection, stenosis (50% or greater) or occlusion. Non stenotic calcified plaque of the carotid bifurcation. Left carotid system: No evidence of dissection, stenosis (50% or greater) or occlusion. Non stenotic calcified plaque of the carotid bifurcation. Vertebral arteries: Left dominant. No evidence of dissection, stenosis (50% or greater) or occlusion. Skeleton: Negative. Other neck: Negative. Upper chest: Negative. Review of the MIP images confirms the above findings CTA HEAD FINDINGS Anterior circulation: No high-grade proximal stenosis, proximal occlusion, aneurysm, or vascular malformation. The left MCA inferior division M3 branch is reconstituted, but with persistent proximal stenosis. Posterior circulation: No significant stenosis, proximal occlusion, aneurysm, or vascular malformation. There multiple segments of mild stenosis in the anterior and posterior intracranial circulation compatible with atherosclerosis, similar to prior CTA of the head. Venous sinuses: As permitted by contrast timing, patent. Anatomic variants: None significant. Delayed phase: No abnormal intracranial enhancement. Review of the MIP images confirms the above findings CT Brain Perfusion Findings: CBF (<30%) Volume: 61mL Perfusion (Tmax>6.0s) volume: 76mL, this is localized to the falx in likely misregistration artifact. Mismatch Volume: 10mL Infarction Location:Negative. Small focus of mildly decreased cerebral blood flow and increased cerebral blood volume within the left parietal lobe. IMPRESSION: CT head: 1. No acute intracranial abnormality identified. 2. ASPECTS is 10. 3. Multiple stable chronic infarctions, microvascular ischemic changes, and volume loss of the brain. CTA neck: 1. Patent carotid and  vertebral arteries. No dissection, aneurysm, or hemodynamically significant stenosis by NASCET criteria. 2. Mild calcific atherosclerosis of the carotid bifurcations in the aorta. CTA head: 1. No new large vessel occlusion, high-grade stenosis, aneurysm, or vascular malformation. 2. Reconstitution of left MCA inferior division M3 branch, persistent stenosis of the branch origin. 3. Intracranial atherosclerosis with multiple segments of mild stenosis in the anterior and posterior circulation. CT perfusion head: 1. No acute infarction or ischemic penumbra by standard CT perfusion criteria. 2. Small focus of mildly decreased cerebral blood flow and increased cerebral blood volume in the left parietal lobe compatible with oligemia without ischemia. These results were called by telephone at the time of interpretation on 04/26/2018 at 6:30 pm to Dr. Cheral Marker, who verbally acknowledged these results. Electronically Signed   By: Kristine Garbe M.D.   On: 04/26/2018 18:33   Ct Angio Chest Pe W Or Wo Contrast  Result Date: 05/02/2018 CLINICAL DATA:  Hypoxemia, noncardiac, etiology unknown EXAM: CT ANGIOGRAPHY CHEST WITH CONTRAST TECHNIQUE: Multidetector CT imaging of the chest was performed using the standard protocol during bolus administration of intravenous contrast. Multiplanar CT image reconstructions and MIPs were obtained to evaluate the vascular anatomy. CONTRAST:  75 cc ISOVUE-370 IOPAMIDOL (ISOVUE-370) INJECTION 76% COMPARISON:  Chest CT angiogram dated 01/30/2011. FINDINGS: Cardiovascular: Pulmonary embolus is seen within the RIGHT interlobar pulmonary artery and within the central and peripheral segmental pulmonary arteries to the RIGHT middle lobe and RIGHT lower lobe. Occlusive thrombus is identified within the RIGHT middle lobe and RIGHT lower lobe. No pulmonary embolism within the main pulmonary arteries bilaterally. Probable small peripheral emboli within the subsegmental pulmonary arteries to the  LEFT lower lobe and lingula. Heart size is within normal limits. No evidence of associated RIGHT heart failure. No pericardial effusion. Aortic atherosclerosis. No aortic aneurysm or evidence of aortic dissection seen. Mediastinum/Nodes: No mass or enlarged lymph nodes seen within the mediastinum or perihilar regions. Esophagus is unremarkable. Trachea appears normal. Lungs/Pleura: Bibasilar consolidations, most likely atelectasis. Small bilateral pleural effusions. Evidence of mild air trapping  bilaterally, presumably associated to the aforementioned pulmonary emboli. Upper Abdomen: Limited images of the upper abdomen are unremarkable. Musculoskeletal: Degenerative changes throughout the kyphotic and slightly scoliotic thoracic spine, mild to moderate in degree. No acute appearing osseous abnormality. Review of the MIP images confirms the above findings. IMPRESSION: 1. Pulmonary emboli within the RIGHT interlobar pulmonary artery and within the central and peripheral segmental pulmonary artery branches to the RIGHT middle lobe and RIGHT lower lobe, overall moderate load of pulmonary embolus, including occlusive thrombus within the RIGHT middle lobe and RIGHT lower lobe. No pulmonary embolus within the main pulmonary arteries. No evidence of associated RIGHT heart failure. 2. Probable small additional emboli within the peripheral subsegmental pulmonary arteries to the LEFT lower lobe and lingula. 3. Mild bibasilar consolidations, most likely atelectasis. Small bilateral pleural effusions. Aortic Atherosclerosis (ICD10-I70.0). Critical Value/emergent results were called by telephone at the time of interpretation on 05/02/2018 at 2:00 pm to Dr. Debbe Odea , who verbally acknowledged these results. Electronically Signed   By: Franki Cabot M.D.   On: 05/02/2018 14:06   Mr Brain Wo Contrast  Result Date: 05/03/2018 CLINICAL DATA:  Acute presentation with seizure. History of strokes and dementia. EXAM: MRI HEAD  WITHOUT CONTRAST TECHNIQUE: Multiplanar, multiecho pulse sequences of the brain and surrounding structures were obtained without intravenous contrast. COMPARISON:  Head CT 04/26/2018.  MRI 03/18/2017. FINDINGS: Brain: Diffusion imaging does not show any acute or subacute infarction or other cause of restricted diffusion. There chronic small-vessel ischemic changes of the pons. No focal cerebellar insult. The right hemisphere shows old infarction in the right PCA territory affecting the posteromedial temporal lobe and occipital lobe. Left hemisphere shows old infarction in the inferior MCA territory affecting the lateral temporal lobe and temporoparietal junction region. Extensive chronic small-vessel ischemic changes are present throughout both hemispheres. Old small vessel infarctions are present affecting the basal ganglia and thalami. There is hemosiderin deposition within the region of the old left MCA infarction. No evidence of recent hemorrhage. No sign of neoplastic mass lesion, hydrocephalus or extra-axial collection. Vascular: Major vessels at the base of the brain show flow. Skull and upper cervical spine: Negative Sinuses/Orbits: Clear/normal Other: None IMPRESSION: No acute finding. Old right PCA territory infarction. Old left MCA territory infarction. Extensive chronic small-vessel ischemic changes elsewhere throughout the brain. Electronically Signed   By: Nelson Chimes M.D.   On: 05/03/2018 15:44   Ct Cerebral Perfusion W Contrast  Result Date: 04/26/2018 CLINICAL DATA:  85 y/o F; altered mental status, left-sided gaze, bilateral weakness. EXAM: CT ANGIOGRAPHY HEAD AND NECK CT PERFUSION BRAIN TECHNIQUE: Multidetector CT imaging of the head and neck was performed using the standard protocol during bolus administration of intravenous contrast. Multiplanar CT image reconstructions and MIPs were obtained to evaluate the vascular anatomy. Carotid stenosis measurements (when applicable) are obtained  utilizing NASCET criteria, using the distal internal carotid diameter as the denominator. Multiphase CT imaging of the brain was performed following IV bolus contrast injection. Subsequent parametric perfusion maps were calculated using RAPID software. CONTRAST:  155mL ISOVUE-370 IOPAMIDOL (ISOVUE-370) INJECTION 76% COMPARISON:  04/21/2018 in 01/15/2018 CT head. 09/21/2017 CT angiogram neck, CT angiogram head, CT perfusion head. 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 Small chronic infarctions in the cerebellum, right hemi pons, bilateral thalami. Small chronic cortical infarction in left posterior frontal lobe. Nonspecific white matter hypodensities compatible with chronic microvascular ischemic changes and volume loss of the brain are stable. No acute stroke, hemorrhage,  mass effect, extra-axial collection, hydrocephalus, or herniation identified. Vascular: As below. Sinuses/Orbits: No acute finding. Other: Bilateral intra-ocular lens replacement. ASPECTS ([place] Stroke Program Early CT Score) - Ganglionic level infarction (caudate, lentiform nuclei, internal capsule, insula, M1-M3 cortex): 7 - Supraganglionic infarction (M4-M6 cortex): 3 Total score (0-10 with 10 being normal): 10 Review of the MIP images confirms the above findings CTA NECK FINDINGS Aortic arch: Standard branching. Imaged portion shows no evidence of aneurysm or dissection. No significant stenosis of the major arch vessel origins. Mild calcific atherosclerosis. Right carotid system: No evidence of dissection, stenosis (50% or greater) or occlusion. Non stenotic calcified plaque of the carotid bifurcation. Left carotid system: No evidence of dissection, stenosis (50% or greater) or occlusion. Non stenotic calcified plaque of the carotid bifurcation. Vertebral arteries: Left dominant. No evidence of dissection, stenosis (50% or greater) or occlusion. Skeleton: Negative. Other neck:  Negative. Upper chest: Negative. Review of the MIP images confirms the above findings CTA HEAD FINDINGS Anterior circulation: No high-grade proximal stenosis, proximal occlusion, aneurysm, or vascular malformation. The left MCA inferior division M3 branch is reconstituted, but with persistent proximal stenosis. Posterior circulation: No significant stenosis, proximal occlusion, aneurysm, or vascular malformation. There multiple segments of mild stenosis in the anterior and posterior intracranial circulation compatible with atherosclerosis, similar to prior CTA of the head. Venous sinuses: As permitted by contrast timing, patent. Anatomic variants: None significant. Delayed phase: No abnormal intracranial enhancement. Review of the MIP images confirms the above findings CT Brain Perfusion Findings: CBF (<30%) Volume: 31mL Perfusion (Tmax>6.0s) volume: 69mL, this is localized to the falx in likely misregistration artifact. Mismatch Volume: 16mL Infarction Location:Negative. Small focus of mildly decreased cerebral blood flow and increased cerebral blood volume within the left parietal lobe. IMPRESSION: CT head: 1. No acute intracranial abnormality identified. 2. ASPECTS is 10. 3. Multiple stable chronic infarctions, microvascular ischemic changes, and volume loss of the brain. CTA neck: 1. Patent carotid and vertebral arteries. No dissection, aneurysm, or hemodynamically significant stenosis by NASCET criteria. 2. Mild calcific atherosclerosis of the carotid bifurcations in the aorta. CTA head: 1. No new large vessel occlusion, high-grade stenosis, aneurysm, or vascular malformation. 2. Reconstitution of left MCA inferior division M3 branch, persistent stenosis of the branch origin. 3. Intracranial atherosclerosis with multiple segments of mild stenosis in the anterior and posterior circulation. CT perfusion head: 1. No acute infarction or ischemic penumbra by standard CT perfusion criteria. 2. Small focus of mildly  decreased cerebral blood flow and increased cerebral blood volume in the left parietal lobe compatible with oligemia without ischemia. These results were called by telephone at the time of interpretation on 04/26/2018 at 6:30 pm to Dr. Cheral Marker, who verbally acknowledged these results. Electronically Signed   By: Kristine Garbe M.D.   On: 04/26/2018 18:33   Dg Chest Port 1 View  Result Date: 05/07/2018 CLINICAL DATA:  Airway aspiration. EXAM: PORTABLE CHEST 1 VIEW COMPARISON:  Radiographs and CT 05/02/2018. FINDINGS: 0704 hours. Mild patient rotation to the left. The heart size and mediastinal contours are stable with aortic atherosclerosis and ectasia. There is stable mild atelectasis at both lung bases, greater on the left. No significant visible pleural fluid. Loop recorder and cholecystectomy clips are noted. IMPRESSION: Radiographically stable appearance of the chest with mild bibasilar atelectasis Electronically Signed   By: Richardean Sale M.D.   On: 05/07/2018 09:07   Dg Chest Port 1 View  Result Date: 05/02/2018 CLINICAL DATA:  New onset fever with tachycardia this morning. EXAM: PORTABLE  CHEST 1 VIEW COMPARISON:  04/29/2018 FINDINGS: Patient is moderately rotated to the left with head/chin overlying the upper mediastinum and left apex. Lungs are adequately inflated without focal airspace consolidation or effusion. Cardiomediastinal silhouette and remainder of the exam is unchanged. IMPRESSION: No active disease. Electronically Signed   By: Marin Olp M.D.   On: 05/02/2018 08:58   Dg Chest Port 1 View  Result Date: 04/29/2018 CLINICAL DATA:  Hypoxia EXAM: PORTABLE CHEST 1 VIEW COMPARISON:  04/27/2018 FINDINGS: Heart size upper normal. Cardiac loop recorder. Ectatic thoracic aorta with calcification unchanged. Improved aeration in the left lung. Lungs are now clear without infiltrate or effusion. Negative for heart failure or edema. IMPRESSION: Interval clearing of left perihilar  airspace disease. Lungs are now clear. Electronically Signed   By: Franchot Gallo M.D.   On: 04/29/2018 08:22   Dg Chest Port 1 View  Result Date: 04/27/2018 CLINICAL DATA:  Fever.  Aspiration. EXAM: PORTABLE CHEST 1 VIEW COMPARISON:  04/26/2018 FINDINGS: Cardiomegaly and aortic tortuosity distorted by leftward rotation. There is an implantable loop recorder. Worsening left perihilar opacity but no evident volume loss. No Kerley lines, effusion, or pneumothorax. IMPRESSION: 1. Limited rotated chest. 2. Equivocal for worsening left perihilar opacity which could reflect infection or aspiration in this setting. Electronically Signed   By: Monte Fantasia M.D.   On: 04/27/2018 09:46   Dg Chest Portable 1 View  Result Date: 04/26/2018 CLINICAL DATA:  85 year old female with altered mental status. EXAM: PORTABLE CHEST 1 VIEW COMPARISON:  Portable chest 04/21/2018 and earlier. FINDINGS: Portable AP semi upright view at 1930 hours. The patient is rotated to the left. Cardiomegaly and mediastinal contours are stable. Stable left chest cardiac event recorder. Allowing for portable technique the lungs are clear. No pneumothorax. Paucity of bowel gas in the upper abdomen. Stable cholecystectomy clips. IMPRESSION: No acute cardiopulmonary abnormality. Electronically Signed   By: Genevie Ann M.D.   On: 04/26/2018 19:51   Dg Chest Portable 1 View  Result Date: 04/21/2018 CLINICAL DATA:  Tachycardia EXAM: PORTABLE CHEST 1 VIEW COMPARISON:  December 13, 2017 FINDINGS: The heart size and mediastinal contours are stable. The heart size is enlarged. The aorta is tortuous. Both lungs are clear. The visualized skeletal structures are stable. IMPRESSION: No active cardiopulmonary disease.  Cardiomegaly. Electronically Signed   By: Abelardo Diesel M.D.   On: 04/21/2018 13:11   Ct Head Code Stroke Wo Contrast  Result Date: 04/26/2018 CLINICAL DATA:  85 y/o F; altered mental status, left-sided gaze, bilateral weakness. EXAM: CT  ANGIOGRAPHY HEAD AND NECK CT PERFUSION BRAIN TECHNIQUE: Multidetector CT imaging of the head and neck was performed using the standard protocol during bolus administration of intravenous contrast. Multiplanar CT image reconstructions and MIPs were obtained to evaluate the vascular anatomy. Carotid stenosis measurements (when applicable) are obtained utilizing NASCET criteria, using the distal internal carotid diameter as the denominator. Multiphase CT imaging of the brain was performed following IV bolus contrast injection. Subsequent parametric perfusion maps were calculated using RAPID software. CONTRAST:  138mL ISOVUE-370 IOPAMIDOL (ISOVUE-370) INJECTION 76% COMPARISON:  04/21/2018 in 01/15/2018 CT head. 09/21/2017 CT angiogram neck, CT angiogram head, CT perfusion head. 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 Small chronic infarctions in the cerebellum, right hemi pons, bilateral thalami. Small chronic cortical infarction in left posterior frontal lobe. Nonspecific white matter hypodensities compatible with chronic microvascular ischemic changes and volume loss of the brain are stable. No acute stroke, hemorrhage, mass effect,  extra-axial collection, hydrocephalus, or herniation identified. Vascular: As below. Sinuses/Orbits: No acute finding. Other: Bilateral intra-ocular lens replacement. ASPECTS ([place] Stroke Program Early CT Score) - Ganglionic level infarction (caudate, lentiform nuclei, internal capsule, insula, M1-M3 cortex): 7 - Supraganglionic infarction (M4-M6 cortex): 3 Total score (0-10 with 10 being normal): 10 Review of the MIP images confirms the above findings CTA NECK FINDINGS Aortic arch: Standard branching. Imaged portion shows no evidence of aneurysm or dissection. No significant stenosis of the major arch vessel origins. Mild calcific atherosclerosis. Right carotid system: No evidence of dissection, stenosis (50% or greater) or  occlusion. Non stenotic calcified plaque of the carotid bifurcation. Left carotid system: No evidence of dissection, stenosis (50% or greater) or occlusion. Non stenotic calcified plaque of the carotid bifurcation. Vertebral arteries: Left dominant. No evidence of dissection, stenosis (50% or greater) or occlusion. Skeleton: Negative. Other neck: Negative. Upper chest: Negative. Review of the MIP images confirms the above findings CTA HEAD FINDINGS Anterior circulation: No high-grade proximal stenosis, proximal occlusion, aneurysm, or vascular malformation. The left MCA inferior division M3 branch is reconstituted, but with persistent proximal stenosis. Posterior circulation: No significant stenosis, proximal occlusion, aneurysm, or vascular malformation. There multiple segments of mild stenosis in the anterior and posterior intracranial circulation compatible with atherosclerosis, similar to prior CTA of the head. Venous sinuses: As permitted by contrast timing, patent. Anatomic variants: None significant. Delayed phase: No abnormal intracranial enhancement. Review of the MIP images confirms the above findings CT Brain Perfusion Findings: CBF (<30%) Volume: 22mL Perfusion (Tmax>6.0s) volume: 21mL, this is localized to the falx in likely misregistration artifact. Mismatch Volume: 64mL Infarction Location:Negative. Small focus of mildly decreased cerebral blood flow and increased cerebral blood volume within the left parietal lobe. IMPRESSION: CT head: 1. No acute intracranial abnormality identified. 2. ASPECTS is 10. 3. Multiple stable chronic infarctions, microvascular ischemic changes, and volume loss of the brain. CTA neck: 1. Patent carotid and vertebral arteries. No dissection, aneurysm, or hemodynamically significant stenosis by NASCET criteria. 2. Mild calcific atherosclerosis of the carotid bifurcations in the aorta. CTA head: 1. No new large vessel occlusion, high-grade stenosis, aneurysm, or vascular  malformation. 2. Reconstitution of left MCA inferior division M3 branch, persistent stenosis of the branch origin. 3. Intracranial atherosclerosis with multiple segments of mild stenosis in the anterior and posterior circulation. CT perfusion head: 1. No acute infarction or ischemic penumbra by standard CT perfusion criteria. 2. Small focus of mildly decreased cerebral blood flow and increased cerebral blood volume in the left parietal lobe compatible with oligemia without ischemia. These results were called by telephone at the time of interpretation on 04/26/2018 at 6:30 pm to Dr. Cheral Marker, who verbally acknowledged these results. Electronically Signed   By: Kristine Garbe M.D.   On: 04/26/2018 18:33   Vas [place] Lower Extremity Venous (dvt)  Result Date: 05/03/2018  Lower Venous Study Indications: Edema.  Performing Technologist: June Leap RDMS, RVT  Examination Guidelines: A complete evaluation includes B-mode imaging, spectral Doppler, color Doppler, and power Doppler as needed of all accessible portions of each vessel. Bilateral testing is considered an integral part of a complete examination. Limited examinations for reoccurring indications may be performed as noted.  Right Venous Findings: +---------+---------------+---------+-----------+----------+-------+          CompressibilityPhasicitySpontaneityPropertiesSummary +---------+---------------+---------+-----------+----------+-------+ CFV      Full           Yes      Yes                          +---------+---------------+---------+-----------+----------+-------+  SFJ      Full                                                 +---------+---------------+---------+-----------+----------+-------+ FV Prox  Full                                                 +---------+---------------+---------+-----------+----------+-------+ FV Mid   Full                                                  +---------+---------------+---------+-----------+----------+-------+ FV DistalFull                                                 +---------+---------------+---------+-----------+----------+-------+ PFV      Full                                                 +---------+---------------+---------+-----------+----------+-------+ POP      Full           Yes      Yes                          +---------+---------------+---------+-----------+----------+-------+ PTV      Full                                                 +---------+---------------+---------+-----------+----------+-------+ PERO     Full                                                 +---------+---------------+---------+-----------+----------+-------+  Left Venous Findings: +---------+---------------+---------+-----------+----------+-------+          CompressibilityPhasicitySpontaneityPropertiesSummary +---------+---------------+---------+-----------+----------+-------+ CFV      Full           Yes      Yes                          +---------+---------------+---------+-----------+----------+-------+ SFJ      Full                                                 +---------+---------------+---------+-----------+----------+-------+ FV Prox  Full                                                 +---------+---------------+---------+-----------+----------+-------+  FV Mid   Full                                                 +---------+---------------+---------+-----------+----------+-------+ FV DistalFull                                                 +---------+---------------+---------+-----------+----------+-------+ PFV      Full                                                 +---------+---------------+---------+-----------+----------+-------+ POP      Full           Yes      Yes                           +---------+---------------+---------+-----------+----------+-------+ PTV      Full                                                 +---------+---------------+---------+-----------+----------+-------+ PERO     None                                         Acute   +---------+---------------+---------+-----------+----------+-------+ Gastroc  Partial                                      Acute   +---------+---------------+---------+-----------+----------+-------+    Summary: Right: There is no evidence of deep vein thrombosis in the lower extremity. No cystic structure found in the popliteal fossa. Left: Findings consistent with acute deep vein thrombosis involving the left peroneal vein, and left gastrocnemius vein. No cystic structure found in the popliteal fossa.  *See table(s) above for measurements and observations. Electronically signed by Deitra Mayo MD on 05/03/2018 at 4:55:09 PM.    Final        Subjective: Patient today is mumbling words, not coherent, no apparent pain or dyspnea. Very poor oral intake.   Discharge Exam: Vitals:   05/10/18 1145 05/10/18 1556  BP: 133/60 (!) 146/55  Pulse: (!) 102 (!) 110  Resp: (!) 22 (!) 24  Temp:  98.5 F (36.9 C)  SpO2: (!) 87% 94%   Vitals:   05/10/18 0622 05/10/18 0852 05/10/18 1145 05/10/18 1556  BP:  (!) 115/45 133/60 (!) 146/55  Pulse:  86 (!) 102 (!) 110  Resp:   (!) 22 (!) 24  Temp: 99.5 F (37.5 C)   98.5 F (36.9 C)  TempSrc: Axillary   Axillary  SpO2:  92% (!) 87% 94%  Weight:      Height:        General: deconditioned and ill looking appearing.  Neurology: Patient is mumbling words with incomprehensive speech, not following commands or opening her eyes.  E  ENT: positive pallor, no icterus, oral mucosa moist Cardiovascular: No JVD. S1-S2 present, rhythmic, no gallops, rubs, or murmurs. Positive non pitting  extremity edema. Pulmonary: positive breath sounds bilaterally, poor air movement, no wheezing,  scattered rhonchi and rales. Gastrointestinal. Abdomen protuberant with no organomegaly, non tender, no rebound or guarding Skin. No rashes Musculoskeletal: no joint deformities   The results of significant diagnostics from this hospitalization (including imaging, microbiology, ancillary and laboratory) are listed below for reference.     Microbiology: No results found for this or any previous visit (from the past 240 hour(s)).   Labs: BNP (last 3 results) No results for input(s): BNP in the last 8760 hours. Basic Metabolic Panel: Recent Labs  Lab 05/05/18 0519 05/06/18 0613 05/07/18 0552 05/08/18 0823 05/09/18 0545  NA 140 143 143 141 141  K 4.1 4.1 3.4* 4.0 4.9  CL 107 110 114* 109 112*  CO2 24 27 26 25 23   GLUCOSE 101* 113* 152* 139* 119*  BUN 16 24* 24* 21 23  CREATININE 0.59 0.68 0.57 0.55 0.69  CALCIUM 8.4* 8.9 8.7* 9.3 9.2   Liver Function Tests: Recent Labs  Lab 05/04/18 1015 05/07/18 0552  AST 32 33  ALT 25 24  ALKPHOS 74 80  BILITOT 0.6 0.5  PROT 5.2* 4.6*  ALBUMIN 2.5* 2.2*   No results for input(s): LIPASE, AMYLASE in the last 168 hours. Recent Labs  Lab 05/04/18 1015 05/05/18 1952 05/07/18 0552  AMMONIA 78* 32 36*   CBC: Recent Labs  Lab 05/05/18 0519 05/06/18 9211 05/07/18 0552 05/08/18 0952 05/08/18 1127 05/09/18 0545 05/10/18 0856  WBC 7.5 8.4 10.4 11.2*  --  12.0*  --   HGB 11.7* 10.0* 8.0* 6.6* 6.7* 8.1* 7.5*  HCT 38.4 32.3* 25.4* 22.2* 22.0* 26.6* 23.6*  MCV 94.8 94.7 95.8 98.7  --  96.0  --   PLT 107* 126* 165 203  --  200  --    Cardiac Enzymes: No results for input(s): CKTOTAL, CKMB, CKMBINDEX, TROPONINI in the last 168 hours. BNP: Invalid input(s): POCBNP CBG: Recent Labs  Lab 05/04/18 1113  GLUCAP 138*   D-Dimer No results for input(s): DDIMER in the last 72 hours. Hgb A1c No results for input(s): HGBA1C in the last 72 hours. Lipid Profile No results for input(s): CHOL, HDL, LDLCALC, TRIG, CHOLHDL, LDLDIRECT in  the last 72 hours. Thyroid function studies No results for input(s): TSH, T4TOTAL, T3FREE, THYROIDAB in the last 72 hours.  Invalid input(s): FREET3 Anemia work up No results for input(s): VITAMINB12, FOLATE, FERRITIN, TIBC, IRON, RETICCTPCT in the last 72 hours. Urinalysis    Component Value Date/Time   COLORURINE YELLOW 04/21/2018 1201   APPEARANCEUR CLEAR 04/21/2018 1201   LABSPEC 1.009 04/21/2018 1201   PHURINE 6.0 04/21/2018 [place] 04/21/2018 [place] 04/21/2018 [place] 04/21/2018 [place] 04/21/2018 [place] 04/21/2018 1201   UROBILINOGEN 0.2 12/19/2014 1830   NITRITE NEGATIVE 04/21/2018 1201   LEUKOCYTESUR MODERATE (A) 04/21/2018 1201   Sepsis Labs Invalid input(s): PROCALCITONIN,  WBC,  LACTICIDVEN Microbiology No results found for this or any previous visit (from the past 240 hour(s)).   Time coordinating discharge: 45 minutes  SIGNED:   Tawni Millers, MD  [facility] 05/10/2018, 5:20 PM

## 2018-05-10 NOTE — Consult Note (Signed)
   [facility] CM Inpatient Consult   05/10/2018  [name] [name] 1924-11-23 [id number]    Patient screened for potential [facility] Care Management services due to high unplanned readmission risk score of 24% (high).  Spoke with inpatient RNCM. Disposition plans are for hospice.  No identifiable [facility] Care Management needs.    Marthenia Rolling, MSN-Ed, RN,BSN [facility] Liaison [phone]

## 2018-05-11 NOTE — Telephone Encounter (Signed)
It is ok to cancel her appt.  She can follow up as needed. Richardson Dopp, PA-C    05/11/2018 5:20 PM

## 2018-05-11 NOTE — Progress Notes (Signed)
Called report to [facility], waiting transportation, via ptar

## 2018-05-11 NOTE — Progress Notes (Signed)
[facility] is ready for patient to arrive.  RN please call report to [phone]  Thank you, Venia Carbon BSN, [facility] Liaison [phone]

## 2018-05-11 NOTE — Progress Notes (Signed)
Patient will DC to: [facility] date: 05/11/2018 Family notified: Daughter, [name] Transport by: Corey Harold  Please contact patient's daughter, [name], when Corey Harold arrives [phone]).   Per MD patient ready for DC to [facility]. RN, patient, patient's family, and facility notified of DC. Discharge Summary and FL2 sent to facility. RN to call report prior to discharge [phone]). DC packet on chart. Ambulance transport requested for patient.   CSW will sign off for now as social work intervention is no longer needed. Please consult [place] again if new needs arise.  Cedric Fishman, LCSW Clinical Social Worker [phone]

## 2018-05-11 NOTE — Discharge Instructions (Signed)
complete

## 2018-05-11 NOTE — Plan of Care (Signed)
Adequate for discharge.

## 2018-06-08 ENCOUNTER — Encounter: Payer: Self-pay | Admitting: *Deleted

## 2018-06-08 DIAGNOSIS — Z006 Encounter for examination for normal comparison and control in clinical research program: Secondary | ICD-10-CM

## 2018-06-10 ENCOUNTER — Ambulatory Visit: Payer: Self-pay | Admitting: Physician Assistant

## 2018-06-22 DEATH — deceased

## 2019-11-01 IMAGING — MR MR HEAD W/O CM
9 of 12 series · 35 of 48 positions shown · non-contrast
Comparison: Head CT 04/26/2018.  MRI 03/18/2017.

CLINICAL DATA: Acute presentation with seizure. History of strokes
and dementia.

EXAM:
MRI HEAD WITHOUT CONTRAST
TECHNIQUE: Multiplanar, multiecho pulse sequences of the brain and surrounding
structures were obtained without intravenous contrast.

[Series 3: DWI · axial · 3.0mm · 1.09mm/px · z∈[-49,+94]mm · 9 of 99 slices shown (1 of 4)]
[im 1/99]
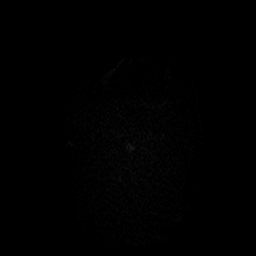
[im 13/99]
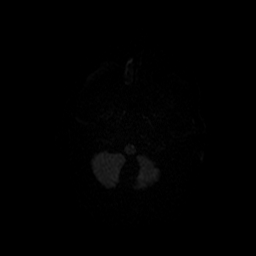
[im 25/99]
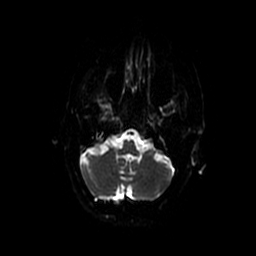
[im 37/99]
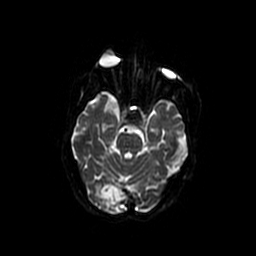
[im 50/99]
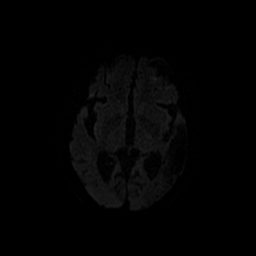
[im 62/99]
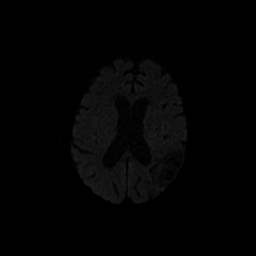
[im 74/99]
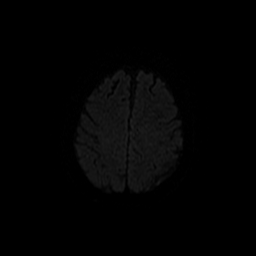
[im 86/99]
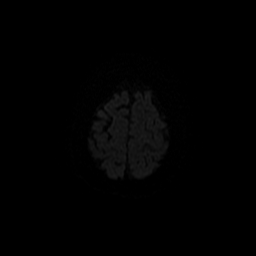
[im 99/99]
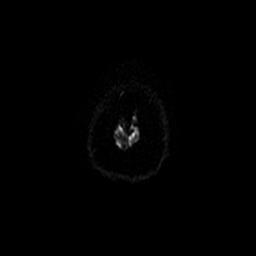

[Series 4: FLAIR · axial · 3.0mm · 0.47mm/px · z∈[-53,+87]mm · 2 of 25 slices shown (1 of 2)]
[im 1/25]
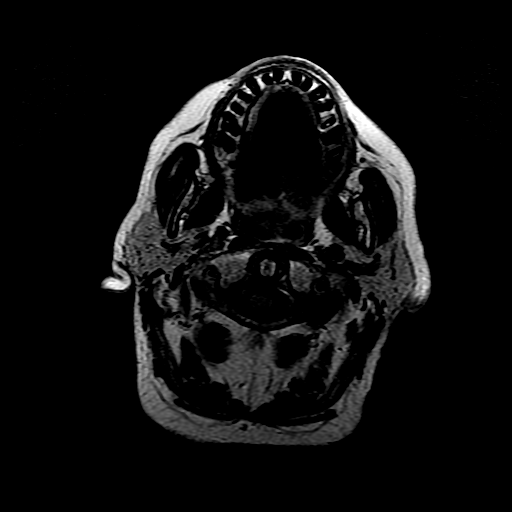
[im 25/25]
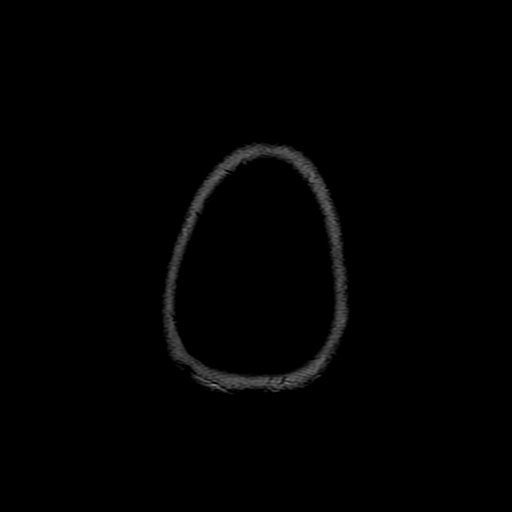

[Series 6: T2 · axial · 5.0mm · 0.47mm/px · 1 of 13 slices shown (1 of 3)]
[im 1/13]
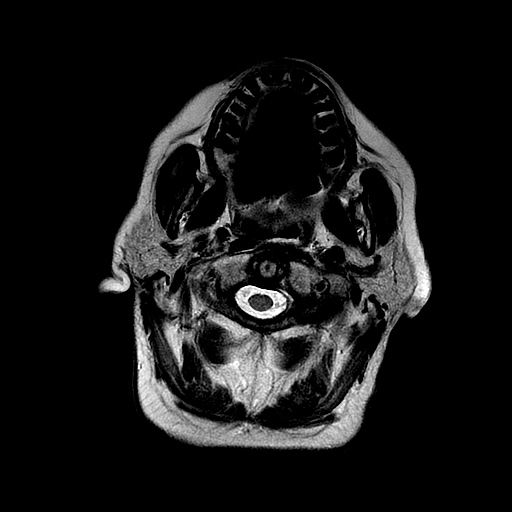

[Series 7: DWI · coronal · 5.0mm · 1.09mm/px · 6 of 70 slices shown (2 of 4)]
[im 1/70]
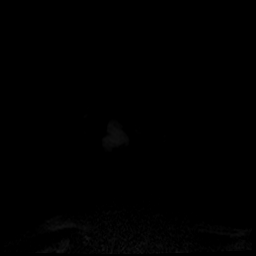
[im 14/70]
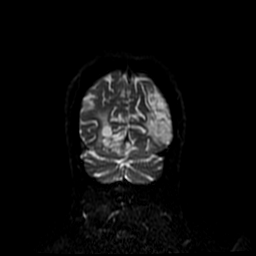
[im 28/70]
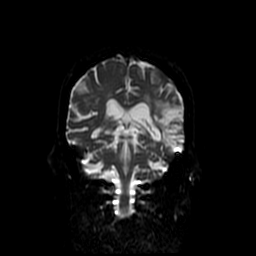
[im 42/70]
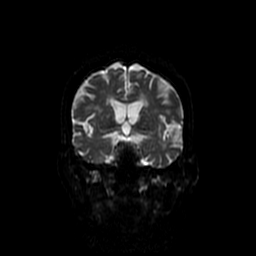
[im 56/70]
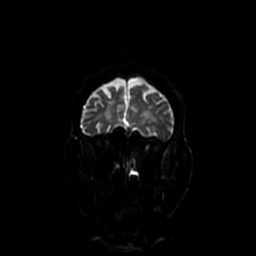
[im 70/70]
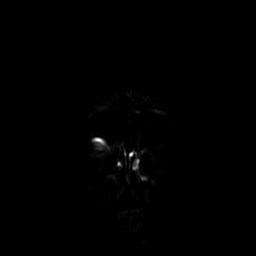

[Series 10: T2 · coronal · 3.0mm · 0.35mm/px · 3 of 31 slices shown (2 of 3)]
[im 1/31]
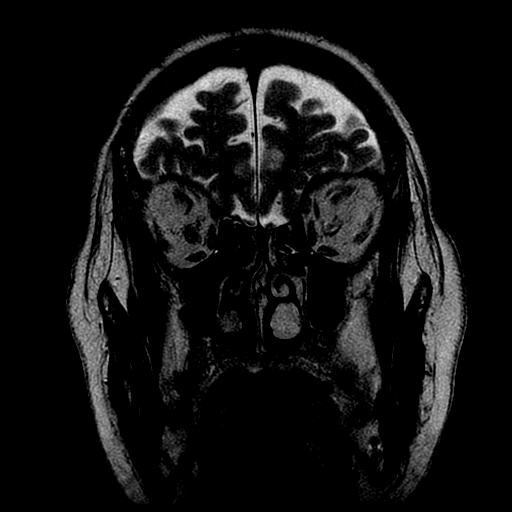
[im 16/31]
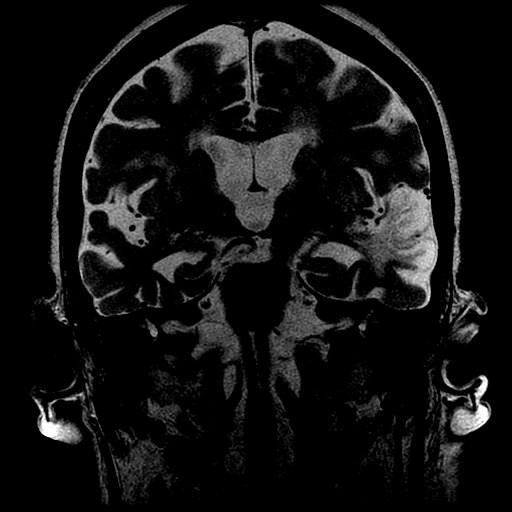
[im 31/31]
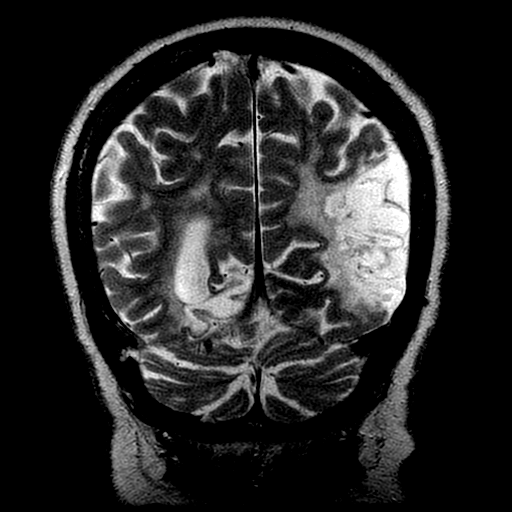

[Series 11: T2 · coronal · 5.0mm · 0.43mm/px · 3 of 30 slices shown (3 of 3)]
[im 1/30]
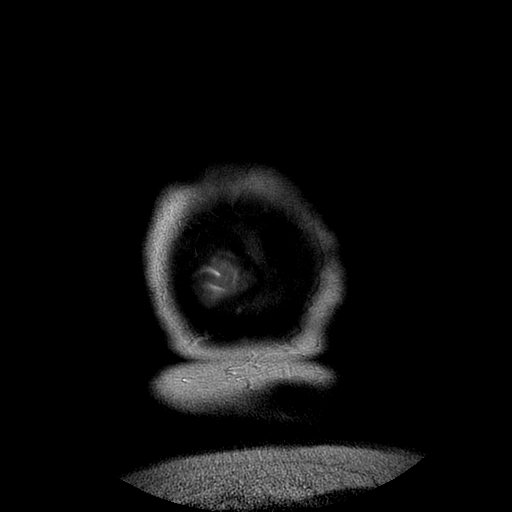
[im 15/30]
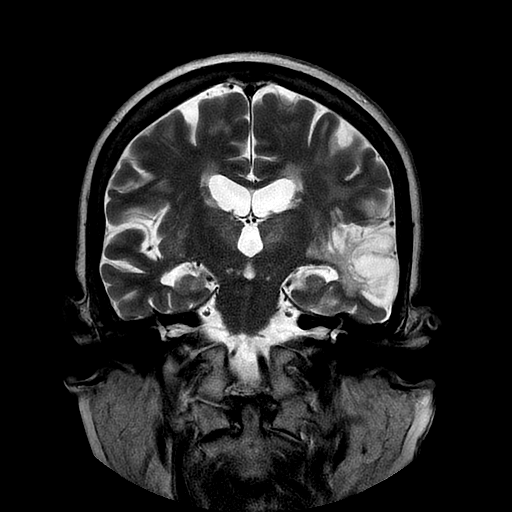
[im 30/30]
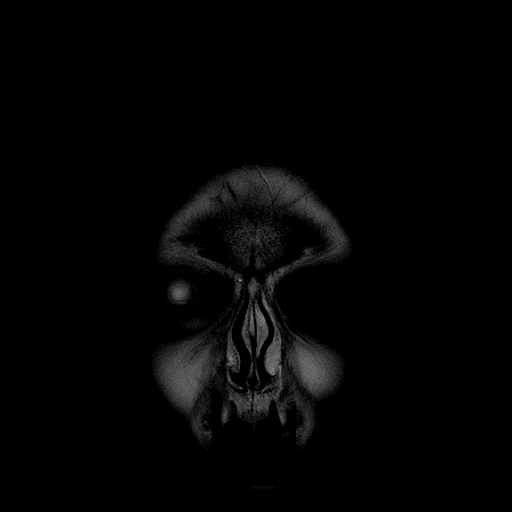

[Series 12: FLAIR · coronal · 3.0mm · 0.35mm/px · 3 of 31 slices shown (2 of 2)]
[im 1/31]
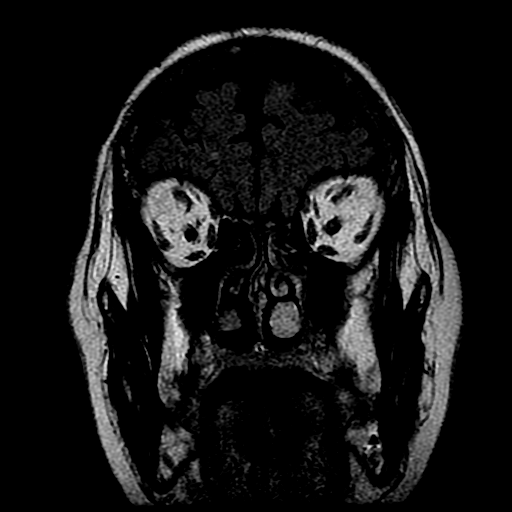
[im 16/31]
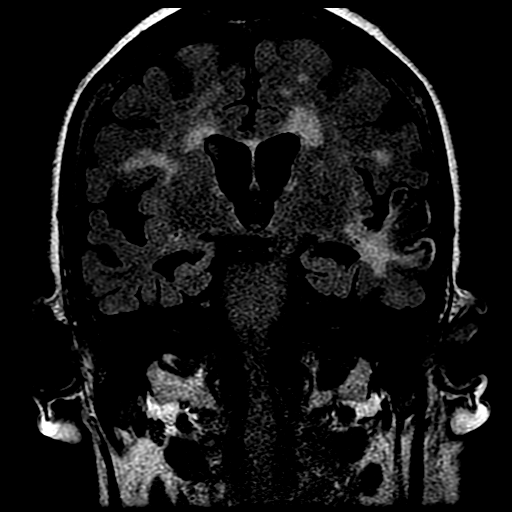
[im 31/31]
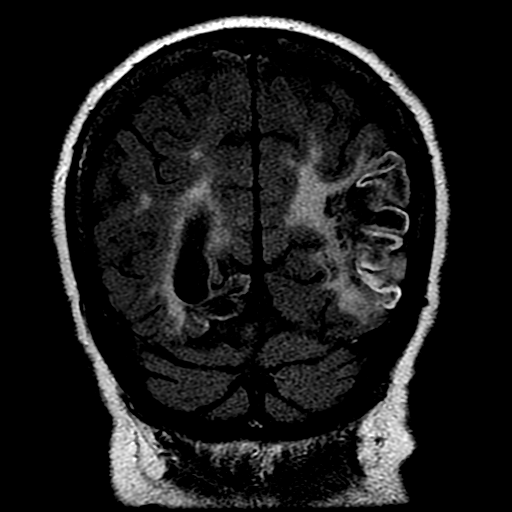

[Series 300: DWI · axial · 3.0mm · 1.09mm/px · z∈[-49,+94]mm · 5 of 50 slices shown (3 of 4)]
[im 1/50]
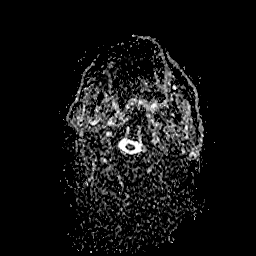
[im 13/50]
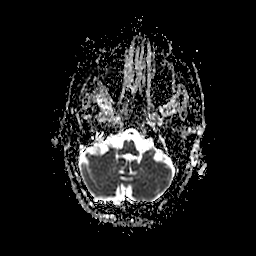
[im 25/50]
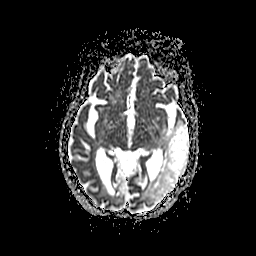
[im 37/50]
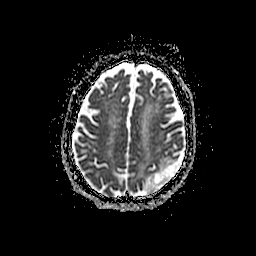
[im 50/50]
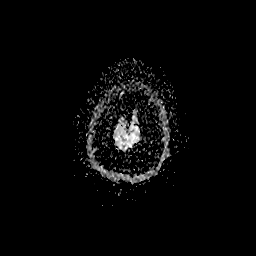

[Series 700: DWI · coronal · 5.0mm · 1.09mm/px · 3 of 35 slices shown (4 of 4)]
[im 1/35]
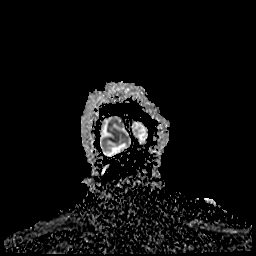
[im 18/35]
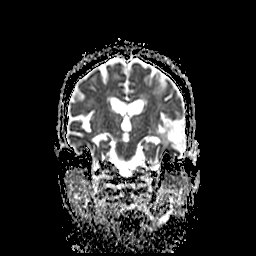
[im 35/35]
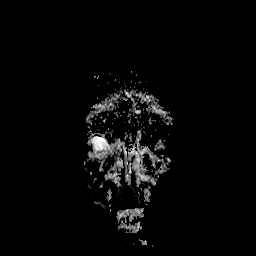

[35 of 48 positions shown; findings below may reference images not displayed]

FINDINGS: Brain: Diffusion imaging does not show any acute or subacute
infarction or other cause of restricted diffusion. There chronic
small-vessel ischemic changes of the pons. No focal cerebellar
insult. The right hemisphere shows old infarction in the right PCA
territory affecting the posteromedial temporal lobe and occipital
lobe. Left hemisphere shows old infarction in the inferior MCA
territory affecting the lateral temporal lobe and temporoparietal
junction region. Extensive chronic small-vessel ischemic changes are
present throughout both hemispheres. Old small vessel infarctions
are present affecting the basal ganglia and thalami. There is
hemosiderin deposition within the region of the old left MCA
infarction. No evidence of recent hemorrhage. No sign of neoplastic
mass lesion, hydrocephalus or extra-axial collection.

Vascular: Major vessels at the base of the brain show flow.

Skull and upper cervical spine: Negative

Sinuses/Orbits: Clear/normal

Other: None
IMPRESSION: No acute finding. Old right PCA territory infarction. Old left MCA
territory infarction. Extensive chronic small-vessel ischemic
changes elsewhere throughout the brain.
